# Patient Record
Sex: Male | Born: 1950
Health system: Southern US, Community
[De-identification: ages and names within clinical notes are randomized; demographics above are authoritative.]

## PROBLEM LIST (undated history)

## (undated) DIAGNOSIS — G4730 Sleep apnea, unspecified: Secondary | ICD-10-CM

## (undated) DIAGNOSIS — J45909 Unspecified asthma, uncomplicated: Secondary | ICD-10-CM

## (undated) DIAGNOSIS — E119 Type 2 diabetes mellitus without complications: Secondary | ICD-10-CM

## (undated) DIAGNOSIS — K759 Inflammatory liver disease, unspecified: Secondary | ICD-10-CM

## (undated) DIAGNOSIS — Z952 Presence of prosthetic heart valve: Secondary | ICD-10-CM

## (undated) DIAGNOSIS — I519 Heart disease, unspecified: Secondary | ICD-10-CM

## (undated) DIAGNOSIS — B1910 Unspecified viral hepatitis B without hepatic coma: Secondary | ICD-10-CM

## (undated) DIAGNOSIS — I779 Disorder of arteries and arterioles, unspecified: Secondary | ICD-10-CM

## (undated) DIAGNOSIS — I10 Essential (primary) hypertension: Secondary | ICD-10-CM

## (undated) DIAGNOSIS — E785 Hyperlipidemia, unspecified: Secondary | ICD-10-CM

## (undated) DIAGNOSIS — Z89612 Acquired absence of left leg above knee: Secondary | ICD-10-CM

## (undated) DIAGNOSIS — I2510 Atherosclerotic heart disease of native coronary artery without angina pectoris: Secondary | ICD-10-CM

## (undated) DIAGNOSIS — I351 Nonrheumatic aortic (valve) insufficiency: Secondary | ICD-10-CM

## (undated) DIAGNOSIS — Z7901 Long term (current) use of anticoagulants: Secondary | ICD-10-CM

## (undated) DIAGNOSIS — M1990 Unspecified osteoarthritis, unspecified site: Secondary | ICD-10-CM

## (undated) DIAGNOSIS — I509 Heart failure, unspecified: Secondary | ICD-10-CM

## (undated) DIAGNOSIS — I219 Acute myocardial infarction, unspecified: Secondary | ICD-10-CM

## (undated) DIAGNOSIS — F419 Anxiety disorder, unspecified: Secondary | ICD-10-CM

## (undated) DIAGNOSIS — E11621 Type 2 diabetes mellitus with foot ulcer: Secondary | ICD-10-CM

## (undated) DIAGNOSIS — I4821 Permanent atrial fibrillation: Secondary | ICD-10-CM

## (undated) DIAGNOSIS — N184 Chronic kidney disease, stage 4 (severe): Secondary | ICD-10-CM

## (undated) DIAGNOSIS — I5042 Chronic combined systolic (congestive) and diastolic (congestive) heart failure: Secondary | ICD-10-CM

## (undated) DIAGNOSIS — I350 Nonrheumatic aortic (valve) stenosis: Secondary | ICD-10-CM

## (undated) HISTORY — PX: CARDIAC VALVE REPLACEMENT: SHX585

## (undated) HISTORY — PX: CHOLECYSTECTOMY: SHX55

## (undated) HISTORY — PX: FRACTURE SURGERY: SHX138

---
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
## 2001-12-03 DIAGNOSIS — B1910 Unspecified viral hepatitis B without hepatic coma: Secondary | ICD-10-CM

## 2001-12-03 HISTORY — DX: Unspecified viral hepatitis B without hepatic coma: B19.10

## 2007-07-09 ENCOUNTER — Encounter: Admission: RE | Admit: 2007-07-09 | Discharge: 2007-09-02 | Payer: Self-pay | Admitting: Endocrinology

## 2008-08-27 ENCOUNTER — Encounter: Admission: RE | Admit: 2008-08-27 | Discharge: 2008-08-27 | Payer: Self-pay | Admitting: Endocrinology

## 2012-02-13 ENCOUNTER — Other Ambulatory Visit: Payer: Self-pay | Admitting: Gynecology

## 2012-02-13 NOTE — Telephone Encounter (Signed)
error 

## 2012-09-10 ENCOUNTER — Other Ambulatory Visit: Payer: Self-pay | Admitting: Cardiovascular Disease

## 2012-09-10 ENCOUNTER — Ambulatory Visit
Admission: RE | Admit: 2012-09-10 | Discharge: 2012-09-10 | Disposition: A | Discharge status: Home / Self Care | Payer: 59 | Source: Ambulatory Visit | Attending: Cardiovascular Disease | Admitting: Cardiovascular Disease

## 2012-09-10 DIAGNOSIS — Z01811 Encounter for preprocedural respiratory examination: Secondary | ICD-10-CM

## 2012-09-10 DIAGNOSIS — R079 Chest pain, unspecified: Secondary | ICD-10-CM

## 2012-09-11 ENCOUNTER — Encounter (HOSPITAL_COMMUNITY): Payer: Self-pay | Admitting: Pharmacy Technician

## 2012-09-17 ENCOUNTER — Other Ambulatory Visit: Payer: Self-pay | Admitting: Cardiovascular Disease

## 2012-09-18 ENCOUNTER — Other Ambulatory Visit: Payer: Self-pay | Admitting: *Deleted

## 2012-09-18 ENCOUNTER — Ambulatory Visit (HOSPITAL_COMMUNITY)
Admission: RE | Admit: 2012-09-18 | Discharge: 2012-09-18 | Disposition: A | Discharge status: Home / Self Care | Payer: 59 | Source: Ambulatory Visit | Attending: Cardiovascular Disease | Admitting: Cardiovascular Disease

## 2012-09-18 ENCOUNTER — Encounter (HOSPITAL_COMMUNITY)
Admission: RE | Disposition: A | Discharge status: Home / Self Care | Payer: Self-pay | Source: Ambulatory Visit | Attending: Cardiovascular Disease

## 2012-09-18 DIAGNOSIS — Z880 Allergy status to penicillin: Secondary | ICD-10-CM | POA: Insufficient documentation

## 2012-09-18 DIAGNOSIS — E119 Type 2 diabetes mellitus without complications: Secondary | ICD-10-CM | POA: Insufficient documentation

## 2012-09-18 DIAGNOSIS — I359 Nonrheumatic aortic valve disorder, unspecified: Secondary | ICD-10-CM

## 2012-09-18 DIAGNOSIS — I4891 Unspecified atrial fibrillation: Secondary | ICD-10-CM | POA: Insufficient documentation

## 2012-09-18 DIAGNOSIS — Z888 Allergy status to other drugs, medicaments and biological substances status: Secondary | ICD-10-CM | POA: Insufficient documentation

## 2012-09-18 DIAGNOSIS — Z7901 Long term (current) use of anticoagulants: Secondary | ICD-10-CM | POA: Insufficient documentation

## 2012-09-18 DIAGNOSIS — G4730 Sleep apnea, unspecified: Secondary | ICD-10-CM | POA: Insufficient documentation

## 2012-09-18 DIAGNOSIS — Z79899 Other long term (current) drug therapy: Secondary | ICD-10-CM | POA: Insufficient documentation

## 2012-09-18 HISTORY — PX: ARCH AORTOGRAM: SHX5501

## 2012-09-18 HISTORY — PX: LEFT AND RIGHT HEART CATHETERIZATION WITH CORONARY ANGIOGRAM: SHX5449

## 2012-09-18 HISTORY — PX: CARDIAC CATHETERIZATION: SHX172

## 2012-09-18 HISTORY — PX: ABDOMINAL ANGIOGRAM: SHX5499

## 2012-09-18 LAB — GLUCOSE, CAPILLARY
Glucose-Capillary: 297 mg/dL — ABNORMAL HIGH (ref 70–99)
Glucose-Capillary: 285 mg/dL — ABNORMAL HIGH (ref 70–99)
Glucose-Capillary: 323 mg/dL — ABNORMAL HIGH (ref 70–99)

## 2012-09-18 LAB — POCT I-STAT 3, ART BLOOD GAS (G3+)
O2 Saturation: 94 %
TCO2: 25 mmol/L (ref 0–100)
pCO2 arterial: 42.5 mmHg (ref 35.0–45.0)
pH, Arterial: 7.357 (ref 7.350–7.450)

## 2012-09-18 LAB — PROTIME-INR
INR: 0.99 (ref 0.00–1.49)
Prothrombin Time: 13 seconds (ref 11.6–15.2)

## 2012-09-18 LAB — POCT I-STAT 3, VENOUS BLOOD GAS (G3P V)
TCO2: 26 mmol/L (ref 0–100)
pCO2, Ven: 45.1 mmHg (ref 45.0–50.0)
pH, Ven: 7.35 — ABNORMAL HIGH (ref 7.250–7.300)

## 2012-09-18 SURGERY — LEFT AND RIGHT HEART CATHETERIZATION WITH CORONARY ANGIOGRAM
Anesthesia: LOCAL

## 2012-09-18 MED ORDER — ASPIRIN 81 MG PO CHEW: 324.0000 mg | CHEWABLE_TABLET | ORAL | Status: DC

## 2012-09-18 MED ORDER — HEPARIN (PORCINE) IN NACL 2-0.9 UNIT/ML-% IJ SOLN
INTRAMUSCULAR | Status: AC
  Filled 2012-09-18: qty 1000

## 2012-09-18 MED ORDER — SODIUM CHLORIDE 0.9 % IV SOLN: 250.0000 mL | INTRAVENOUS | Status: DC | PRN

## 2012-09-18 MED ORDER — SODIUM CHLORIDE 0.9 % IV SOLN
INTRAVENOUS | Status: DC
  Administered 2012-09-18: 08:00:00 via INTRAVENOUS

## 2012-09-18 MED ORDER — NITROGLYCERIN 0.2 MG/ML ON CALL CATH LAB
INTRAVENOUS | Status: AC
  Filled 2012-09-18: qty 1

## 2012-09-18 MED ORDER — ONDANSETRON HCL 4 MG/2ML IJ SOLN: 4.0000 mg | Freq: Four times a day (QID) | INTRAMUSCULAR | Status: DC | PRN

## 2012-09-18 MED ORDER — ASPIRIN 81 MG PO CHEW
CHEWABLE_TABLET | ORAL | Status: AC
  Administered 2012-09-18: 324 mg
  Filled 2012-09-18: qty 4

## 2012-09-18 MED ORDER — INSULIN REGULAR HUMAN 100 UNIT/ML IJ SOLN: 5.0000 [IU] | Freq: Once | INTRAMUSCULAR | Status: DC

## 2012-09-18 MED ORDER — LIDOCAINE HCL (PF) 1 % IJ SOLN
INTRAMUSCULAR | Status: AC
  Filled 2012-09-18: qty 30

## 2012-09-18 MED ORDER — INSULIN ASPART 100 UNIT/ML ~~LOC~~ SOLN
SUBCUTANEOUS | Status: AC
  Filled 2012-09-18: qty 1

## 2012-09-18 MED ORDER — ASPIRIN EC 325 MG PO TBEC: 325.0000 mg | DELAYED_RELEASE_TABLET | Freq: Every day | ORAL | Status: DC

## 2012-09-18 MED ORDER — MIDAZOLAM HCL 2 MG/2ML IJ SOLN
INTRAMUSCULAR | Status: AC
  Filled 2012-09-18: qty 2

## 2012-09-18 MED ORDER — SODIUM CHLORIDE 0.9 % IJ SOLN: 3.0000 mL | INTRAMUSCULAR | Status: DC | PRN

## 2012-09-18 MED ORDER — SODIUM CHLORIDE 0.9 % IV SOLN: INTRAVENOUS | Status: DC

## 2012-09-18 MED ORDER — DIAZEPAM 5 MG PO TABS: 5.0000 mg | ORAL_TABLET | ORAL | Status: DC

## 2012-09-18 MED ORDER — SODIUM CHLORIDE 0.9 % IJ SOLN: 3.0000 mL | Freq: Two times a day (BID) | INTRAMUSCULAR | Status: DC

## 2012-09-18 MED ORDER — DIPHENHYDRAMINE HCL 50 MG/ML IJ SOLN
INTRAMUSCULAR | Status: AC
  Filled 2012-09-18: qty 1

## 2012-09-18 MED ORDER — INSULIN ASPART 100 UNIT/ML ~~LOC~~ SOLN
5.0000 [IU] | Freq: Once | SUBCUTANEOUS | Status: AC
  Administered 2012-09-18: 5 [IU] via SUBCUTANEOUS

## 2012-09-18 MED ORDER — FENTANYL CITRATE 0.05 MG/ML IJ SOLN
INTRAMUSCULAR | Status: AC
  Filled 2012-09-18: qty 2

## 2012-09-18 MED ORDER — DIPHENHYDRAMINE HCL 50 MG/ML IJ SOLN
25.0000 mg | Freq: Once | INTRAMUSCULAR | Status: AC
  Administered 2012-09-18: 25 mg via INTRAVENOUS

## 2012-09-18 MED ORDER — FAMOTIDINE IN NACL 20-0.9 MG/50ML-% IV SOLN
20.0000 mg | Freq: Once | INTRAVENOUS | Status: AC
  Administered 2012-09-18: 20 mg via INTRAVENOUS

## 2012-09-18 MED ORDER — ACETAMINOPHEN 325 MG PO TABS: 650.0000 mg | ORAL_TABLET | ORAL | Status: DC | PRN

## 2012-09-18 MED ORDER — DIAZEPAM 5 MG PO TABS
ORAL_TABLET | ORAL | Status: AC
  Administered 2012-09-18: 5 mg
  Filled 2012-09-18: qty 1

## 2012-09-18 NOTE — Progress Notes (Signed)
Discussed with [name].  Plan is for AVR on 09/29/12.  Will therefore, not restart Coumadin but wilol begin xarelto 20 mg begin tomorrow. Will hold for 3 days prior to planned surgical date. Pharmacy is [facility]. Joseph Hoffman A 09/18/2012 2:11 PM

## 2012-09-18 NOTE — H&P (Signed)
  Updated H&P: See complete dictated office note from 08/29/2012.  Since that evaluation patient has decided to proceed with cardiac catheterization and probable AVR if indicated. I have discussed procedure in detail with patient including risks and benefits.  No chest pain or SOB. No change in PEx. Labs reviewed.  He has been off coumadin since 10/13. Inr today 0.99. Plan R and L heart cath this am.

## 2012-09-18 NOTE — CV Procedure (Signed)
R and L heart Catheterization  Joseph Hoffman, 61 y.o., male  Full note dictated; see diagram  DICTATION # [id number], [id number]  RA: 20  (Corrected due to re zero) 10  RV: 50/13;  corrected 40/10 PA: 50/29; corrected 40/19 PC: 29 ;    Corrected 19  Pullback: LV: 188/14 AO: 123/60 Peak to peak gradient: 65 mm Hg; mean gradient 50 mm Hg CO: Thermo 6.9;   Fick 6.2 l/m CI:                 2.9;           2.6l/m/m2  AVA: 1.0cm2 (Thermo); 0.9 cm2(Fick)  EF 45% Calcified AV with reduced excursion. No sig AR   No significant CAD  REC: AVR Depending on timing of surgery, since patient has persistent AF will need to consider resumption of anticoagulation if surgery is to be delayed significantly.  Troy Sine, MD, [facility] 09/18/2012 11:02 AM

## 2012-09-18 NOTE — Consult Note (Signed)
[address]            [place] [address]          [id number]       Reason for Consult: Severe aortic stenosis Referring Physician:  Dr. Shelva Majestic  Joseph Hoffman is an 61 y.o. male.  HPI:  The patient is a 61 year old gentleman who is originally from [place], [place] and has a long-standing history of chronic atrial fibrillation dating back to 1994. He said he had been treated with digoxin in the past and has been maintained on Coumadin. He was referred to cardiology in March of 2013 due to some chest discomfort and abnormal EKG. He was noted to have a heart murmur which had never been mentioned before. An echocardiogram showed moderately severe aortic stenosis with a mean gradient of 53 and a peak gradient of 85 with a calculated aortic valve area of 1.0. He had severe dilatation of the left atrium and moderately severe dilatation of the right atrium with an ejection fraction of 45-50%. A repeat echocardiogram on 08/22/2012 showed a calcified aortic valve with further progression of his aortic stenosis with a mean gradient of 69 and a peak gradient of 104 and a calculated aortic valve area of 0.85 cm. There is mild mitral regurgitation and mitral annular calcification. EF is still 45-50%. He says he did not have any symptoms until the past week or so when he has noticed some tightness in the left chest and neck.  PMH:  Chronic Atrial Fibrillation since 1994  Diabetes  Sleep apnea    PSH:  Laparoscopic cholecystectomy in past year  Family hx:  Mother had AVR in her 41's  Father had CABG  Social History:  does not have a smoking history on file. He does not have any smokeless tobacco history on file. His alcohol and drug histories not on file.  Allergies:  Allergies  Allergen Reactions  . Iohexol Anaphylaxis  . Penicillins Other (See Comments)    Unknown.[name]    Medications:  I have reviewed the patient's  current medications. Prior to Admission:  Prescriptions prior to admission  Medication Sig Dispense Refill  . amLODipine-valsartan (EXFORGE) 5-320 MG per tablet Take 0.5 tablets by mouth every evening.       . Canagliflozin (INVOKANA) 300 MG TABS Take 1 tablet by mouth every morning.       . cholecalciferol (VITAMIN D) 1000 UNITS tablet Take 1,000 Units by mouth daily.      . Chromium 200 MCG CAPS Take 200 mcg by mouth daily.      . digoxin (LANOXIN) 0.25 MG tablet Take 0.375 mg by mouth every evening.       . escitalopram (LEXAPRO) 10 MG tablet Take 10 mg by mouth every evening.       . Glucosamine-Chondroit-Vit C-Mn (GLUCOSAMINE 1500 COMPLEX PO) Take 1,500 mg by mouth daily.      [name] glyBURIDE (DIABETA) 5 MG tablet Take 5 mg by mouth daily with breakfast.      . insulin glargine (LANTUS) 100 UNIT/ML injection Inject 5-20 Units into the skin 2 (two) times daily. 5-20 units at breakfast and dinner depending on blood sugar levels      . Liraglutide (VICTOZA) 18 MG/3ML SOLN Inject 18 mg into the skin daily with breakfast.      . metFORMIN (GLUCOPHAGE-XR) 500 MG 24  hr tablet Take 2,000 mg by mouth at bedtime.      . Multiple Vitamins-Minerals (CENTRUM SPECIALIST HEART PO) Take 1 tablet by mouth 2 (two) times daily.      . Niacin-Simvastatin (SIMCOR PO) Take 1 tablet by mouth at bedtime.      [name] warfarin (COUMADIN) 10 MG tablet Take 10-15 mg by mouth every evening. 10 mg mon thru sat and 15 mg on sun      . albuterol (PROVENTIL HFA;VENTOLIN HFA) 108 (90 BASE) MCG/ACT inhaler Inhale 2 puffs into the lungs every 6 (six) hours as needed. For shortness of breath      . Fluticasone-Salmeterol (ADVAIR) 250-50 MCG/DOSE AEPB Inhale 1 puff into the lungs 2 (two) times daily as needed. For asthma related symptoms      . hydrochlorothiazide (HYDRODIURIL) 25 MG tablet Take 25 mg by mouth daily.       Scheduled:   . aspirin      . aspirin  324 mg Oral Pre-Cath  . aspirin EC  325 mg Oral Daily  . diazepam        . diazepam  5 mg Oral On Call  . diphenhydrAMINE      . diphenhydrAMINE  25 mg Intravenous Once  . famotidine  20 mg Intravenous Once  . fentaNYL      . heparin      . lidocaine      . midazolam      . nitroGLYCERIN      . sodium chloride  3 mL Intravenous Q12H   Continuous:   . sodium chloride 75 mL/hr at 09/18/12 0819  . sodium chloride     [id number] chloride, acetaminophen, ondansetron (ZOFRAN) IV, sodium chloride Anti-infectives    None      Results for orders placed during the hospital encounter of 09/18/12 (from the past 48 hour(s))  PROTIME-INR     Status: Normal   Collection Time   09/18/12  7:19 AM      Component Value Range Comment   Prothrombin Time 13.0  11.6 - 15.2 seconds    INR 0.99  0.00 - 1.49   GLUCOSE, CAPILLARY     Status: Abnormal   Collection Time   09/18/12  7:33 AM      Component Value Range Comment   Glucose-Capillary 297 (*) 70 - 99 mg/dL    Comment 1 Documented in Chart      Comment 2 Notify RN     GLUCOSE, CAPILLARY     Status: Abnormal   Collection Time   09/18/12 10:41 AM      Component Value Range Comment   Glucose-Capillary 285 (*) 70 - 99 mg/dL     No results found.  Review of Systems  Constitutional: Positive for weight loss. Negative for fever, chills, malaise/fatigue and diaphoresis.  HENT: Negative.   Eyes: Negative.   Respiratory: Negative.   Cardiovascular: Positive for chest pain. Negative for orthopnea, leg swelling and PND.  Gastrointestinal: Negative.   Genitourinary: Negative.   Musculoskeletal: Negative.   Skin: Negative.   Neurological: Negative.  Negative for weakness.  Endo/Heme/Allergies: Negative.   Psychiatric/Behavioral: Negative.    Blood pressure 149/97, pulse 81, temperature 99.3 F (37.4 C), temperature source Oral, resp. rate 20, height 5\' 11"  (1.803 m), weight 117.935 kg (260 lb), SpO2 96.00%. Physical Exam  Constitutional: He is oriented to person, place, and time. He appears well-developed and  well-nourished. No distress.  HENT:  Head: Normocephalic and atraumatic.  Mouth/Throat: Oropharynx  is clear and moist.  Eyes: Conjunctivae normal and EOM are normal. Pupils are equal, round, and reactive to light.  Neck: Normal range of motion. Neck supple. No JVD present. No thyromegaly present.  Cardiovascular: Normal rate and intact distal pulses.   Murmur heard.      Irregularly irregular rhythm.  3/6 harsh systolic murmur over aorta.  Respiratory: Effort normal and breath sounds normal.  GI: Bowel sounds are normal. He exhibits no distension and no mass. There is no tenderness.  Musculoskeletal: He exhibits no edema.  Neurological: He is alert and oriented to person, place, and time. He has normal strength. No cranial nerve deficit or sensory deficit.  Skin: Skin is warm and dry.  Psychiatric: He has a normal mood and affect.    Cardiac Cath:  RA: 20 (Corrected due to re zero) 10  RV: 50/13; corrected 40/10  PA: 50/29; corrected 40/19  PC: 29 ; Corrected 19  Pullback:  LV: 188/14  AO: 123/60  Peak to peak gradient: 65 mm Hg; mean gradient 50 mm Hg  CO: Thermo 6.9; Fick 6.2 l/m  CI: 2.9; 2.6l/m/m2  AVA: 1.0cm2 (Thermo); 0.9 cm2(Fick)  EF 45%  Calcified AV with reduced excursion.  No sig AR  No significant CAD  REC: AVR  Assessment/Plan:  He has progressed to severe aortic stenosis and may be starting to develop symptoms. I agree that aortic valve replacement is indicated to prevent progressive left ventricular dysfunction and acute decompensation or sudden death. Since he has chronic atrial fibrillation and will remain on Coumadin and is only 61 years old I will plan on using a mechanical valve. I discussed the pros and cons of mechanical and tissue valves with the patient and his wife and he is in agreement with using a mechanical valve. He has a long history of chronic atrial fibrillation dating back to 1994 with severely dilated left and right atria. It is very unlikely  that he would maintain sinus rhythm even with a Maze procedure. Since his rate is well -controlled and he is going to be anticoagulated I would not plan any further intervention for his atrial fibrillation. I discussed the operative procedure with the patient and family including alternatives, benefits and risks; including but not limited to bleeding, blood transfusion, infection, stroke, myocardial infarction, graft failure, heart block requiring a permanent pacemaker, organ dysfunction, and death.  Earleen Newport understands and agrees to proceed.  We will schedule surgery for Monday 09/29/2012 at the patient's request. He will be started on Xarelto for his atrial fibrillation and that will be discontinued 3 days preop.  BARTLE,BRYAN K 09/18/2012, 2:33 PM

## 2012-09-19 NOTE — Cardiovascular Report (Signed)
Joseph Hoffman, Joseph Hoffman NO.:  [id number]  MEDICAL RECORD NO.:  [id number]  LOCATION:  [facility]                         FACILITY:  [facility]  PHYSICIAN:  Shelva Majestic, M.D.     DATE OF BIRTH:  [birth year]  DATE OF PROCEDURE:  09/18/2012 DATE OF DISCHARGE:  09/18/2012                           CARDIAC CATHETERIZATION   PROCEDURE:  Right and left heart catheterization.  INDICATIONS:  [name] is a very pleasant 61 year old gentleman originally from [place], [place].  He has a history of long- standing atrial fibrillation dating back to 61.  He had first presented to me in March 2013 through referral of Dr. Wilson Singer for an abnormal ECG and chest sensation.  An echo Doppler study at that time revealed moderately severe aortic stenosis with a mean gradient of 53, maximum peak instantaneous gradient of 85, and a calculated aortic valve area of 1.0.  Ejection fraction was in the 45-50% range.  He subsequently underwent repeat echo Doppler study on August 22, 2012. Ejection fraction was approximately 45%.  He has significant biatrial enlargement, moderately severe calcification of his aorta predominantly involving the noncoronary cusp and his aortic stenosis had progressed such that now his mean gradient was 69, peak instantaneous gradient 104, and calculated aortic valve area was 0.85 square cm.  Estimated RV systolic pressure was 29 mm.  The patient now presents for definitive right and left heart catheterization to further evaluate his severe aortic stenosis.  PROCEDURE:  After premedication with Versed 2 mg plus fentanyl 50 mcg, the patient was prepped and draped in usual fashion.  His right femoral artery was punctured anteriorly and a 6-French sheath was inserted.  A 7- French venous sheath was inserted into the right femoral vein.  Gordy Councilman catheterization was done with the catheter being advanced to the RA, RV, PA, PC positions with hemodynamic pressure  recording.  Midway through the pressures were re-zeroed such that the initial atrial pressures will probably overestimate it and perhaps were 10 mm less. The thermodilution cardiac output was obtained.  Oxygen saturation was obtained in the PA.  A pigtail catheter was then inserted via the 6- French sheath into the central aorta, central aortic pressure and PA pressure were recorded.  The pigtail catheter was able to cross using a straight wire into the left ventricle.  O2 saturation was obtained in the left ventricle for Fick cardiac output determination.  Simultaneous LV, PC pressures were recorded.  Also the pressure transducer from the right heart cath was placed on the FA and LV, FA pressures were recorded.  Left ventriculography was performed in the RAO projection. An LV-AO pullback was then performed.  Aortic root was then obtained as well as distal aortography.  The pigtail catheter was then removed. Attention was then directed at the coronary arteries and 5-French FL4, 5- [place] FL5, and FR4 catheters were used.  The FL5 catheter was necessary for more selective engagement into the left circumflex system since there was almost a common ostium, left main which immediately bifurcated into an LAD and circumflex.  The arterial sheath was then closed using the Minx closure device system with excellent hemostasis.  Hemostasis was  obtained by direct manual pressure for the venous system.  The patient tolerated the procedure well and returned to his room in satisfactory condition.  Right atrial pressure following adjustment for 0 pressure was in the 10- 15 mm range.  Initial RV pressure was recorded at 50/13, PA pressure 49/33 but these actually may be overestimated due to the re-zeroing that was later done.  Pulmonary capillary wedge pressure subsequently was 20, V wave 26, mean of 18.  On pullback, LV pressure was 180/14, AO pressure was 188/14, LV pressure was 188/14, AO pressure  was 123/60, giving a peak to peak gradient of 65 mm.  The mean gradient was approximately 50 mm.  Cardiac output was 6.9 by thermodilution and 6.2 by Fick method with an index of 2.9 and 2.6 L per meter squared respectively.  Aortic valve area was 1.0 by the thermodilution method and 0.9 cm squared by the Fick method.  Fluoroscopy revealed significantly calcified aortic valve with reduced mobility.  Aortography did not demonstrate any significant aortic insufficiency.  The aortic root did not appear to be significantly dilated.  RAO ventriculography revealed an ejection fraction of approximately 45%. Distal aortography did not demonstrate any significant renal artery stenosis or significant aortoiliac disease.  ANGIOGRAPHIC DATA:  The left main essentially was a common ostium which immediately bifurcated into the LAD and circumflex system.  The LAD had mild luminal irregularity but was without significant stenoses and gave rise to 3 proximal septal perforating arteries and 2 major diagonal vessels and extended to the apex.  The circumflex vessel was free of significant disease.  It gave rise to a major bifurcating marginal branch, which extended to the apex.  Selective angiography in the right coronary artery revealed a fairly normal right coronary artery, which supplied the PDA and posterolateral vessel.  IMPRESSION: 1. Severe calcific aortic stenosis with a peak to peak gradient of 65     mm, mean gradient of 50 mm, and calculated aortic valve area at 0.9-     1.0 square cm. 2. Mild left ventricular dysfunction with an ejection fraction of 45%. 3. No significant coronary obstructive disease.  RECOMMENDATION:  Surgical consultation for consideration of aortic valve replacement surgery.          ______________________________ Shelva Majestic, M.D.     TK/MEDQ  D:  09/18/2012  T:  09/19/2012  Job:  [id number]  cc:   Ronaldo Miyamoto, M.D.

## 2012-09-25 ENCOUNTER — Ambulatory Visit (HOSPITAL_COMMUNITY)
Admission: RE | Admit: 2012-09-25 | Discharge: 2012-09-25 | Disposition: A | Discharge status: Home / Self Care | Payer: 59 | Source: Ambulatory Visit | Attending: Surgery | Admitting: Surgery

## 2012-09-25 ENCOUNTER — Encounter (HOSPITAL_COMMUNITY)
Admission: RE | Admit: 2012-09-25 | Discharge: 2012-09-25 | Disposition: A | Discharge status: Home / Self Care | Payer: 59 | Source: Ambulatory Visit | Attending: Surgery | Admitting: Surgery

## 2012-09-25 VITALS — BP 127/77 | HR 40 | Temp 98.4°F | Resp 20 | Ht 68.0 in | Wt 262.6 lb

## 2012-09-25 DIAGNOSIS — I359 Nonrheumatic aortic valve disorder, unspecified: Secondary | ICD-10-CM

## 2012-09-25 DIAGNOSIS — Z01810 Encounter for preprocedural cardiovascular examination: Secondary | ICD-10-CM

## 2012-09-25 DIAGNOSIS — Z01811 Encounter for preprocedural respiratory examination: Secondary | ICD-10-CM | POA: Insufficient documentation

## 2012-09-25 DIAGNOSIS — I350 Nonrheumatic aortic (valve) stenosis: Secondary | ICD-10-CM | POA: Insufficient documentation

## 2012-09-25 HISTORY — DX: Sleep apnea, unspecified: G47.30

## 2012-09-25 HISTORY — DX: Acute myocardial infarction, unspecified: I21.9

## 2012-09-25 HISTORY — DX: Unspecified asthma, uncomplicated: J45.909

## 2012-09-25 HISTORY — DX: Inflammatory liver disease, unspecified: K75.9

## 2012-09-25 HISTORY — DX: Anxiety disorder, unspecified: F41.9

## 2012-09-25 HISTORY — DX: Nonrheumatic aortic (valve) stenosis: I35.0

## 2012-09-25 HISTORY — DX: Unspecified osteoarthritis, unspecified site: M19.90

## 2012-09-25 LAB — COMPREHENSIVE METABOLIC PANEL
ALT: 20 U/L (ref 0–53)
AST: 20 U/L (ref 0–37)
Alkaline Phosphatase: 48 U/L (ref 39–117)
CO2: 23 mEq/L (ref 19–32)
GFR calc Af Amer: >90 mL/min (ref >90)
Glucose, Bld: 107 mg/dL — ABNORMAL HIGH (ref 70–99)
Potassium: 4.1 mEq/L (ref 3.5–5.1)
Sodium: 136 mEq/L (ref 135–145)
Total Protein: 7.3 g/dL (ref 6.0–8.3)

## 2012-09-25 LAB — URINALYSIS, ROUTINE W REFLEX MICROSCOPIC
Hgb urine dipstick: NEGATIVE
Leukocytes, UA: NEGATIVE
Nitrite: NEGATIVE
Protein, ur: NEGATIVE mg/dL
Specific Gravity, Urine: 1.045 — ABNORMAL HIGH (ref 1.005–1.030)
Urobilinogen, UA: 0.2 mg/dL (ref 0.0–1.0)

## 2012-09-25 LAB — PULMONARY FUNCTION TEST

## 2012-09-25 LAB — BLOOD GAS, ARTERIAL
Bicarbonate: 24 mEq/L (ref 20.0–24.0)
Drawn by: 206361
FIO2: 0.21 %
pCO2 arterial: 37 mmHg (ref 35.0–45.0)
pH, Arterial: 7.428 (ref 7.350–7.450)
pO2, Arterial: 83.3 mmHg (ref 80.0–100.0)

## 2012-09-25 LAB — URINE MICROSCOPIC-ADD ON

## 2012-09-25 LAB — TYPE AND SCREEN: Antibody Screen: NEGATIVE

## 2012-09-25 LAB — CBC
Hemoglobin: 15.9 g/dL (ref 13.0–17.0)
Platelets: 191 10*3/uL (ref 150–400)
RBC: 5.48 MIL/uL (ref 4.22–5.81)

## 2012-09-25 MED ORDER — CHLORHEXIDINE GLUCONATE 4 % EX LIQD: 30.0000 mL | CUTANEOUS | Status: DC

## 2012-09-25 NOTE — Progress Notes (Signed)
VASCULAR LAB PRELIMINARY  PRELIMINARY  PRELIMINARY  PRELIMINARY  Pre-op Cardiac Surgery  Carotid Findings:  Bilateral:  No evidence of hemodynamically significant internal carotid artery stenosis.   Vertebral artery flow is antegrade.     Upper Extremity Right Left  Brachial Pressures 122 Triphasic 125 Triphasic  Radial Waveforms Triphasic Triphasic  Ulnar Waveforms Triphasic Triphasic  Palmar Arch (Allen's Test) Normal Normal   Findings:  Doppler waveforms remained normal bilaterally with both radial and ulnar compressions                             Lindi Abram, RVS 09/25/2012, 1:24 PM  BIGGS,SANDRA, RVT 09/25/2012, 1:24 PM

## 2012-09-25 NOTE — Pre-Procedure Instructions (Signed)
[place]  09/25/2012   Your procedure is scheduled on:  Monday September 29, 2012 at 0730 AM  Report to [facility] at [facility].  Call this number if you have problems the morning of surgery: [phone]   Remember:   Do not eat food or drink:After Midnight.Sunday       Take these medicines the morning of surgery with A SIP OF WATER: Use Albuterol and Advair inhaler if needed and bring with you day of surgery. Stop Coumadin as ordered             . You may wear deodorant.  Do not shave 48 hours prior to surgery. Men may shave face and neck.  Do not bring valuables to the hospital.  Contacts, dentures or bridgework may not be worn into surgery.  Leave suitcase in the car. After surgery it may be brought to your room.  For patients admitted to the hospital, checkout time is 11:00 AM the day of discharge.   Patients discharged the day of surgery will not be allowed to drive home.    Special Instructions: Incentive Spirometry - Practice and bring it with you on the day of surgery. Shower using CHG 2 nights before surgery and the night before surgery.  If you shower the day of surgery use CHG.  Use special wash - you have one bottle of CHG for all showers.  You should use approximately 1/3 of the bottle for each shower.   Please read over the following fact sheets that you were given: Pain Booklet, Coughing and Deep Breathing, Blood Transfusion Information, MRSA Information and Surgical Site Infection Prevention

## 2012-09-26 NOTE — Progress Notes (Signed)
Requested sleep study from [facility]

## 2012-09-26 NOTE — Consult Note (Signed)
Anesthesia Chart Review:  Patient is a 61 year old male scheduled for AVR on 09/29/12.  History includes severe AS, former smoker, obesity, DM2, OSA, anxiety, hepatitis (not specified) '03, asthma, afib.  PCP is Dr. Anda Kraft.  Cardiologist is Dr. Shelva Majestic.  EKG on 09/25/12 showed afib, right superior axis deviation, non-specific intraventricular block, RVH.  Cardiac cath on 09/18/12 showed: 1. Severe calcific aortic stenosis with a peak to peak gradient of 65 mm, mean gradient of 50 mm, and calculated aortic valve area at 0.9-1.0 square cm.  2. Mild left ventricular dysfunction with an ejection fraction of 45%.  3. No significant coronary obstructive disease.   Echo on 08/22/12 [facility]) showed technically difficult study. A. fib with controlled ventricular rate, intraventricular conduction delay, right bundle branch block.  EF 40-45%. LA was severely dilated. Calcification of the anterior posterior mitral valve leaflets. Calcified mitral apparatus. No significant mitral valve stenosis. Right ventricular systolic pressure was normal. Moderate to severe calcified predominant non-coronary cusp. Mild to moderate aortic regurgitation. Severe valvular aortic stenosis. There was aortic root sclerosis/calcification.  He had normal myocardial perfusion by nuclear stress test on 02/08/12 [facility]).  There was minimal chronic bronchitic changes on chest x-ray from 09/25/2012.  Labs noted.  Cr 0.75, glucose 107 (mean plasma glucose 180).  UA with > 1000 glucose.  A1C 7.9.  WBC 11.8, H/H 15.9/45.6.  PT/PTT WNL.  (Urine glucose and A1C called to Marlana Latus, RN at [facility].)  Myra Gianotti, PA-C

## 2012-09-28 MED ORDER — POTASSIUM CHLORIDE 2 MEQ/ML IV SOLN
80.0000 meq | INTRAVENOUS | Status: DC
  Filled 2012-09-28: qty 40

## 2012-09-28 MED ORDER — EPINEPHRINE HCL 1 MG/ML IJ SOLN
0.5000 ug/min | INTRAVENOUS | Status: DC
  Filled 2012-09-28: qty 4

## 2012-09-28 MED ORDER — MAGNESIUM SULFATE 50 % IJ SOLN
40.0000 meq | INTRAMUSCULAR | Status: DC
  Filled 2012-09-28: qty 10

## 2012-09-28 MED ORDER — SODIUM CHLORIDE 0.9 % IV SOLN
INTRAVENOUS | Status: AC
  Administered 2012-09-29: 70 mL/h via INTRAVENOUS
  Filled 2012-09-28: qty 40

## 2012-09-28 MED ORDER — CEFUROXIME SODIUM 1.5 G IJ SOLR
1.5000 g | INTRAMUSCULAR | Status: AC
  Filled 2012-09-28: qty 1.5

## 2012-09-28 MED ORDER — DOPAMINE-DEXTROSE 3.2-5 MG/ML-% IV SOLN
2.0000 ug/kg/min | INTRAVENOUS | Status: DC
  Filled 2012-09-28: qty 250

## 2012-09-28 MED ORDER — VANCOMYCIN HCL 1000 MG IV SOLR
1500.0000 mg | INTRAVENOUS | Status: AC
  Administered 2012-09-29: 1500 mg via INTRAVENOUS
  Filled 2012-09-28: qty 1500

## 2012-09-28 MED ORDER — METOPROLOL TARTRATE 12.5 MG HALF TABLET
12.5000 mg | ORAL_TABLET | Freq: Once | ORAL | Status: AC
  Administered 2012-09-29: 12.5 mg via ORAL
  Filled 2012-09-28: qty 1

## 2012-09-28 MED ORDER — DEXTROSE 5 % IV SOLN
750.0000 mg | INTRAVENOUS | Status: AC
  Filled 2012-09-28: qty 750

## 2012-09-28 MED ORDER — DEXMEDETOMIDINE HCL IN NACL 400 MCG/100ML IV SOLN
0.1000 ug/kg/h | INTRAVENOUS | Status: AC
  Administered 2012-09-29: 0.3 ug/kg/h via INTRAVENOUS
  Filled 2012-09-28: qty 100

## 2012-09-28 MED ORDER — PHENYLEPHRINE HCL 10 MG/ML IJ SOLN
30.0000 ug/min | INTRAVENOUS | Status: DC
  Filled 2012-09-28: qty 2

## 2012-09-28 MED ORDER — PLASMA-LYTE 148 IV SOLN
INTRAVENOUS | Status: AC
  Filled 2012-09-28: qty 2.5

## 2012-09-28 MED ORDER — SODIUM CHLORIDE 0.9 % IV SOLN
INTRAVENOUS | Status: AC
  Administered 2012-09-29: 1 [IU]/h via INTRAVENOUS
  Filled 2012-09-28: qty 1

## 2012-09-28 MED ORDER — NITROGLYCERIN IN D5W 200-5 MCG/ML-% IV SOLN
2.0000 ug/min | INTRAVENOUS | Status: AC
  Administered 2012-09-29: 5 ug/min via INTRAVENOUS
  Filled 2012-09-28: qty 250

## 2012-09-29 ENCOUNTER — Encounter (HOSPITAL_COMMUNITY): Payer: Self-pay | Admitting: Vascular Surgery

## 2012-09-29 ENCOUNTER — Inpatient Hospital Stay (HOSPITAL_COMMUNITY)
Admission: RE | Admit: 2012-09-29 | Discharge: 2012-10-04 | DRG: 219 | Disposition: A | Discharge status: Home / Self Care | Payer: 59 | Source: Ambulatory Visit | Attending: Surgery | Admitting: Surgery

## 2012-09-29 ENCOUNTER — Inpatient Hospital Stay (HOSPITAL_COMMUNITY): Payer: 59

## 2012-09-29 ENCOUNTER — Encounter (HOSPITAL_COMMUNITY): Payer: Self-pay | Admitting: *Deleted

## 2012-09-29 ENCOUNTER — Ambulatory Visit (HOSPITAL_COMMUNITY): Payer: 59 | Admitting: Vascular Surgery

## 2012-09-29 ENCOUNTER — Encounter (HOSPITAL_COMMUNITY)
Admission: RE | Disposition: A | Discharge status: Home / Self Care | Payer: Self-pay | Source: Ambulatory Visit | Attending: Surgery

## 2012-09-29 DIAGNOSIS — I4821 Permanent atrial fibrillation: Secondary | ICD-10-CM

## 2012-09-29 DIAGNOSIS — Z952 Presence of prosthetic heart valve: Secondary | ICD-10-CM

## 2012-09-29 DIAGNOSIS — E669 Obesity, unspecified: Secondary | ICD-10-CM | POA: Diagnosis present

## 2012-09-29 DIAGNOSIS — Z794 Long term (current) use of insulin: Secondary | ICD-10-CM

## 2012-09-29 DIAGNOSIS — E119 Type 2 diabetes mellitus without complications: Secondary | ICD-10-CM | POA: Diagnosis present

## 2012-09-29 DIAGNOSIS — I4901 Ventricular fibrillation: Secondary | ICD-10-CM | POA: Diagnosis not present

## 2012-09-29 DIAGNOSIS — Z79899 Other long term (current) drug therapy: Secondary | ICD-10-CM

## 2012-09-29 DIAGNOSIS — I350 Nonrheumatic aortic (valve) stenosis: Secondary | ICD-10-CM | POA: Diagnosis present

## 2012-09-29 DIAGNOSIS — Z6841 Body Mass Index (BMI) 40.0 and over, adult: Secondary | ICD-10-CM

## 2012-09-29 DIAGNOSIS — I359 Nonrheumatic aortic valve disorder, unspecified: Principal | ICD-10-CM | POA: Diagnosis present

## 2012-09-29 DIAGNOSIS — G4733 Obstructive sleep apnea (adult) (pediatric): Secondary | ICD-10-CM | POA: Diagnosis present

## 2012-09-29 DIAGNOSIS — I4891 Unspecified atrial fibrillation: Secondary | ICD-10-CM | POA: Diagnosis present

## 2012-09-29 DIAGNOSIS — E8779 Other fluid overload: Secondary | ICD-10-CM | POA: Diagnosis not present

## 2012-09-29 DIAGNOSIS — Z7901 Long term (current) use of anticoagulants: Secondary | ICD-10-CM

## 2012-09-29 HISTORY — DX: Permanent atrial fibrillation: I48.21

## 2012-09-29 HISTORY — DX: Heart disease, unspecified: I51.9

## 2012-09-29 HISTORY — DX: Long term (current) use of anticoagulants: Z79.01

## 2012-09-29 HISTORY — DX: Presence of prosthetic heart valve: Z95.2

## 2012-09-29 HISTORY — DX: Type 2 diabetes mellitus without complications: E11.9

## 2012-09-29 HISTORY — PX: AORTIC VALVE REPLACEMENT: SHX41

## 2012-09-29 LAB — GLUCOSE, CAPILLARY
Glucose-Capillary: 119 mg/dL — ABNORMAL HIGH (ref 70–99)
Glucose-Capillary: 123 mg/dL — ABNORMAL HIGH (ref 70–99)
Glucose-Capillary: 144 mg/dL — ABNORMAL HIGH (ref 70–99)
Glucose-Capillary: 133 mg/dL — ABNORMAL HIGH (ref 70–99)

## 2012-09-29 LAB — CBC
HCT: 34.2 % — ABNORMAL LOW (ref 39.0–52.0)
HCT: 35.5 % — ABNORMAL LOW (ref 39.0–52.0)
Hemoglobin: 11.9 g/dL — ABNORMAL LOW (ref 13.0–17.0)
Hemoglobin: 11.9 g/dL — ABNORMAL LOW (ref 13.0–17.0)
MCHC: 33.5 g/dL (ref 30.0–36.0)
MCV: 83.6 fL (ref 78.0–100.0)
WBC: 15.6 10*3/uL — ABNORMAL HIGH (ref 4.0–10.5)

## 2012-09-29 LAB — POCT I-STAT 4, (NA,K, GLUC, HGB,HCT)
Glucose, Bld: 195 mg/dL — ABNORMAL HIGH (ref 70–99)
Glucose, Bld: 225 mg/dL — ABNORMAL HIGH (ref 70–99)
Glucose, Bld: 187 mg/dL — ABNORMAL HIGH (ref 70–99)
HCT: 31 % — ABNORMAL LOW (ref 39.0–52.0)
HCT: 33 % — ABNORMAL LOW (ref 39.0–52.0)
HCT: 33 % — ABNORMAL LOW (ref 39.0–52.0)
Hemoglobin: 13.9 g/dL (ref 13.0–17.0)
Hemoglobin: 10.5 g/dL — ABNORMAL LOW (ref 13.0–17.0)
Hemoglobin: 10.9 g/dL — ABNORMAL LOW (ref 13.0–17.0)
Potassium: 4.5 mEq/L (ref 3.5–5.1)
Potassium: 4.3 mEq/L (ref 3.5–5.1)
Potassium: 3.8 mEq/L (ref 3.5–5.1)
Sodium: 138 mEq/L (ref 135–145)
Sodium: 139 mEq/L (ref 135–145)
Sodium: 139 mEq/L (ref 135–145)

## 2012-09-29 LAB — POCT I-STAT 3, ART BLOOD GAS (G3+)
Acid-Base Excess: 2 mmol/L (ref 0.0–2.0)
Acid-base deficit: 2 mmol/L (ref 0.0–2.0)
Acid-base deficit: 2 mmol/L (ref 0.0–2.0)
Bicarbonate: 27.9 mEq/L — ABNORMAL HIGH (ref 20.0–24.0)
Bicarbonate: 24.7 mEq/L — ABNORMAL HIGH (ref 20.0–24.0)
Bicarbonate: 22.9 mEq/L (ref 20.0–24.0)
O2 Saturation: 100 %
O2 Saturation: 93 %
O2 Saturation: 97 %
Patient temperature: 35.9
Patient temperature: 37.9
TCO2: 29 mmol/L (ref 0–100)
TCO2: 26 mmol/L (ref 0–100)
TCO2: 25 mmol/L (ref 0–100)
TCO2: 25 mmol/L (ref 0–100)
pCO2 arterial: 35.4 mmHg (ref 35.0–45.0)
pCO2 arterial: 44.3 mmHg (ref 35.0–45.0)
pH, Arterial: 7.378 (ref 7.350–7.450)
pH, Arterial: 7.451 — ABNORMAL HIGH (ref 7.350–7.450)
pH, Arterial: 7.351 (ref 7.350–7.450)
pO2, Arterial: 108 mmHg — ABNORMAL HIGH (ref 80.0–100.0)

## 2012-09-29 LAB — POCT I-STAT, CHEM 8
Chloride: 107 mEq/L (ref 96–112)
Glucose, Bld: 114 mg/dL — ABNORMAL HIGH (ref 70–99)
HCT: 35 % — ABNORMAL LOW (ref 39.0–52.0)
Potassium: 4.7 mEq/L (ref 3.5–5.1)
Sodium: 141 mEq/L (ref 135–145)

## 2012-09-29 LAB — HEMOGLOBIN AND HEMATOCRIT, BLOOD: HCT: 34.3 % — ABNORMAL LOW (ref 39.0–52.0)

## 2012-09-29 LAB — APTT: aPTT: 35 seconds (ref 24–37)

## 2012-09-29 LAB — CREATININE, SERUM
Creatinine, Ser: 0.66 mg/dL (ref 0.50–1.35)
GFR calc non Af Amer: >90 mL/min (ref >90)

## 2012-09-29 LAB — PROTIME-INR: INR: 1.16 (ref 0.00–1.49)

## 2012-09-29 SURGERY — REPLACEMENT, AORTIC VALVE, OPEN
Anesthesia: General | Site: Chest | Wound class: Clean

## 2012-09-29 MED ORDER — LACTATED RINGERS IV SOLN
INTRAVENOUS | Status: DC | PRN
  Administered 2012-09-29 (×2): via INTRAVENOUS

## 2012-09-29 MED ORDER — ASPIRIN 81 MG PO CHEW: 324.0000 mg | CHEWABLE_TABLET | Freq: Every day | ORAL | Status: DC

## 2012-09-29 MED ORDER — VANCOMYCIN HCL IN DEXTROSE 1-5 GM/200ML-% IV SOLN
1000.0000 mg | Freq: Once | INTRAVENOUS | Status: AC
  Administered 2012-09-29: 1000 mg via INTRAVENOUS
  Filled 2012-09-29: qty 200

## 2012-09-29 MED ORDER — LACTATED RINGERS IV SOLN
INTRAVENOUS | Status: DC | PRN
  Administered 2012-09-29 (×2): via INTRAVENOUS

## 2012-09-29 MED ORDER — SODIUM CHLORIDE 0.9 % IV SOLN
INTRAVENOUS | Status: DC
  Administered 2012-09-29: 20 mL/h via INTRAVENOUS

## 2012-09-29 MED ORDER — BISACODYL 5 MG PO TBEC
10.0000 mg | DELAYED_RELEASE_TABLET | Freq: Every day | ORAL | Status: DC
  Administered 2012-09-30: 10 mg via ORAL
  Filled 2012-09-29: qty 2

## 2012-09-29 MED ORDER — METOPROLOL TARTRATE 1 MG/ML IV SOLN: 2.5000 mg | INTRAVENOUS | Status: DC | PRN

## 2012-09-29 MED ORDER — MOXIFLOXACIN HCL IN NACL 400 MG/250ML IV SOLN
400.0000 mg | INTRAVENOUS | Status: AC
  Administered 2012-09-29: 400 mg via INTRAVENOUS

## 2012-09-29 MED ORDER — PLASMA-LYTE 148 IV SOLN
INTRAVENOUS | Status: DC | PRN
  Administered 2012-09-29: 10:00:00 via INTRAVASCULAR

## 2012-09-29 MED ORDER — PHENYLEPHRINE HCL 10 MG/ML IJ SOLN
10.0000 mg | INTRAVENOUS | Status: DC | PRN
  Administered 2012-09-29: 20 ug/min via INTRAVENOUS

## 2012-09-29 MED ORDER — PROPOFOL 10 MG/ML IV BOLUS
INTRAVENOUS | Status: DC | PRN
  Administered 2012-09-29: 50 mg via INTRAVENOUS

## 2012-09-29 MED ORDER — HEPARIN SODIUM (PORCINE) 1000 UNIT/ML IJ SOLN
INTRAMUSCULAR | Status: DC | PRN
  Administered 2012-09-29: 32000 [IU] via INTRAVENOUS

## 2012-09-29 MED ORDER — PANTOPRAZOLE SODIUM 40 MG PO TBEC
40.0000 mg | DELAYED_RELEASE_TABLET | Freq: Every day | ORAL | Status: DC
  Administered 2012-10-01: 40 mg via ORAL
  Filled 2012-09-29: qty 1

## 2012-09-29 MED ORDER — FENTANYL CITRATE 0.05 MG/ML IJ SOLN
INTRAMUSCULAR | Status: DC | PRN
  Administered 2012-09-29: 250 ug via INTRAVENOUS
  Administered 2012-09-29: 400 ug via INTRAVENOUS
  Administered 2012-09-29 (×3): 250 ug via INTRAVENOUS
  Administered 2012-09-29: 100 ug via INTRAVENOUS

## 2012-09-29 MED ORDER — MORPHINE SULFATE 2 MG/ML IJ SOLN
1.0000 mg | INTRAMUSCULAR | Status: AC | PRN
  Administered 2012-09-29: 1 mg via INTRAVENOUS
  Administered 2012-09-29 (×2): 2 mg via INTRAVENOUS
  Administered 2012-09-29: 1 mg via INTRAVENOUS
  Filled 2012-09-29 (×3): qty 1

## 2012-09-29 MED ORDER — METOPROLOL TARTRATE 25 MG/10 ML ORAL SUSPENSION
12.5000 mg | Freq: Two times a day (BID) | ORAL | Status: DC
  Filled 2012-09-29 (×3): qty 5

## 2012-09-29 MED ORDER — LACTATED RINGERS IV SOLN: 500.0000 mL | Freq: Once | INTRAVENOUS | Status: AC | PRN

## 2012-09-29 MED ORDER — DOCUSATE SODIUM 100 MG PO CAPS
200.0000 mg | ORAL_CAPSULE | Freq: Every day | ORAL | Status: DC
Start: 2012-09-30 — End: 2012-10-01
  Administered 2012-09-30 – 2012-10-01 (×2): 200 mg via ORAL
  Filled 2012-09-29 (×2): qty 2

## 2012-09-29 MED ORDER — SODIUM CHLORIDE 0.9 % IV SOLN: 250.0000 mL | INTRAVENOUS | Status: DC

## 2012-09-29 MED ORDER — THROMBIN 20000 UNITS EX SOLR
CUTANEOUS | Status: AC
  Filled 2012-09-29: qty 20000

## 2012-09-29 MED ORDER — SODIUM CHLORIDE 0.45 % IV SOLN
INTRAVENOUS | Status: DC
  Administered 2012-09-29: 20 mL/h via INTRAVENOUS

## 2012-09-29 MED ORDER — NITROGLYCERIN IN D5W 200-5 MCG/ML-% IV SOLN: 0.0000 ug/min | INTRAVENOUS | Status: DC

## 2012-09-29 MED ORDER — METOPROLOL TARTRATE 12.5 MG HALF TABLET
12.5000 mg | ORAL_TABLET | Freq: Two times a day (BID) | ORAL | Status: DC
  Filled 2012-09-29 (×3): qty 1

## 2012-09-29 MED ORDER — ROCURONIUM BROMIDE 100 MG/10ML IV SOLN
INTRAVENOUS | Status: DC | PRN
  Administered 2012-09-29 (×2): 50 mg via INTRAVENOUS

## 2012-09-29 MED ORDER — VECURONIUM BROMIDE 10 MG IV SOLR
INTRAVENOUS | Status: DC | PRN
  Administered 2012-09-29 (×2): 10 mg via INTRAVENOUS

## 2012-09-29 MED ORDER — SODIUM CHLORIDE 0.9 % IJ SOLN
3.0000 mL | Freq: Two times a day (BID) | INTRAMUSCULAR | Status: DC
  Administered 2012-09-30 – 2012-10-01 (×3): 3 mL via INTRAVENOUS

## 2012-09-29 MED ORDER — BISACODYL 10 MG RE SUPP: 10.0000 mg | Freq: Every day | RECTAL | Status: DC

## 2012-09-29 MED ORDER — DEXMEDETOMIDINE HCL IN NACL 200 MCG/50ML IV SOLN
0.1000 ug/kg/h | INTRAVENOUS | Status: DC
  Administered 2012-09-29: 0.7 ug/kg/h via INTRAVENOUS
  Administered 2012-09-29: 0.5 ug/kg/h via INTRAVENOUS
  Filled 2012-09-29 (×2): qty 50

## 2012-09-29 MED ORDER — PHENYLEPHRINE HCL 10 MG/ML IJ SOLN
0.0000 ug/min | INTRAVENOUS | Status: DC
  Filled 2012-09-29: qty 2

## 2012-09-29 MED ORDER — INSULIN REGULAR BOLUS VIA INFUSION
0.0000 [IU] | Freq: Three times a day (TID) | INTRAVENOUS | Status: DC
  Filled 2012-09-29: qty 10

## 2012-09-29 MED ORDER — MIDAZOLAM HCL 2 MG/2ML IJ SOLN: 2.0000 mg | INTRAMUSCULAR | Status: DC | PRN

## 2012-09-29 MED ORDER — METOPROLOL TARTRATE 12.5 MG HALF TABLET
12.5000 mg | ORAL_TABLET | Freq: Two times a day (BID) | ORAL | Status: DC
  Filled 2012-09-29: qty 1

## 2012-09-29 MED ORDER — MAGNESIUM SULFATE 40 MG/ML IJ SOLN
4.0000 g | Freq: Once | INTRAMUSCULAR | Status: AC
  Administered 2012-09-29: 4 g via INTRAVENOUS

## 2012-09-29 MED ORDER — MAGNESIUM SULFATE 40 MG/ML IJ SOLN
INTRAMUSCULAR | Status: AC
  Administered 2012-09-29: 4 g via INTRAVENOUS
  Filled 2012-09-29: qty 100

## 2012-09-29 MED ORDER — 0.9 % SODIUM CHLORIDE (POUR BTL) OPTIME
TOPICAL | Status: DC | PRN
  Administered 2012-09-29: 6000 mL

## 2012-09-29 MED ORDER — MORPHINE SULFATE 2 MG/ML IJ SOLN
2.0000 mg | INTRAMUSCULAR | Status: DC | PRN
  Administered 2012-09-30: 4 mg via INTRAVENOUS
  Administered 2012-09-30: 2 mg via INTRAVENOUS
  Filled 2012-09-29: qty 1
  Filled 2012-09-29: qty 2

## 2012-09-29 MED ORDER — ALBUMIN HUMAN 5 % IV SOLN
250.0000 mL | INTRAVENOUS | Status: AC | PRN
  Administered 2012-09-29 – 2012-09-30 (×2): 250 mL via INTRAVENOUS
  Filled 2012-09-29: qty 250

## 2012-09-29 MED ORDER — ONDANSETRON HCL 4 MG/2ML IJ SOLN: 4.0000 mg | Freq: Four times a day (QID) | INTRAMUSCULAR | Status: DC | PRN

## 2012-09-29 MED ORDER — ARTIFICIAL TEARS OP OINT
TOPICAL_OINTMENT | OPHTHALMIC | Status: DC | PRN
  Administered 2012-09-29: 1 via OPHTHALMIC

## 2012-09-29 MED ORDER — LACTATED RINGERS IV SOLN
INTRAVENOUS | Status: DC
  Administered 2012-09-29 – 2012-09-30 (×3): 20 mL/h via INTRAVENOUS

## 2012-09-29 MED ORDER — OXYCODONE HCL 5 MG PO TABS
5.0000 mg | ORAL_TABLET | ORAL | Status: DC | PRN
  Administered 2012-09-29 – 2012-10-01 (×6): 10 mg via ORAL
  Filled 2012-09-29 (×6): qty 2

## 2012-09-29 MED ORDER — ACETAMINOPHEN 160 MG/5ML PO SOLN
975.0000 mg | Freq: Four times a day (QID) | ORAL | Status: DC
  Filled 2012-09-29: qty 40.6

## 2012-09-29 MED ORDER — POTASSIUM CHLORIDE 10 MEQ/50ML IV SOLN
10.0000 meq | INTRAVENOUS | Status: AC
  Administered 2012-09-29 (×3): 10 meq via INTRAVENOUS

## 2012-09-29 MED ORDER — ALBUMIN HUMAN 5 % IV SOLN
INTRAVENOUS | Status: DC | PRN
  Administered 2012-09-29 (×2): via INTRAVENOUS

## 2012-09-29 MED ORDER — ACETAMINOPHEN 500 MG PO TABS
1000.0000 mg | ORAL_TABLET | Freq: Four times a day (QID) | ORAL | Status: DC
  Administered 2012-09-30 – 2012-10-01 (×7): 1000 mg via ORAL
  Filled 2012-09-29 (×11): qty 2

## 2012-09-29 MED ORDER — SODIUM CHLORIDE 0.9 % IJ SOLN: 3.0000 mL | INTRAMUSCULAR | Status: DC | PRN

## 2012-09-29 MED ORDER — ASPIRIN EC 325 MG PO TBEC
325.0000 mg | DELAYED_RELEASE_TABLET | Freq: Every day | ORAL | Status: DC
  Filled 2012-09-29: qty 1

## 2012-09-29 MED ORDER — THROMBIN 20000 UNITS EX SOLR
OROMUCOSAL | Status: DC | PRN
  Administered 2012-09-29 (×3): via TOPICAL

## 2012-09-29 MED ORDER — THROMBIN 20000 UNITS EX SOLR
CUTANEOUS | Status: DC | PRN
  Administered 2012-09-29: 20000 [IU] via TOPICAL

## 2012-09-29 MED ORDER — MIDAZOLAM HCL 5 MG/5ML IJ SOLN
INTRAMUSCULAR | Status: DC | PRN
  Administered 2012-09-29: 2 mg via INTRAVENOUS
  Administered 2012-09-29 (×2): 4 mg via INTRAVENOUS
  Administered 2012-09-29: 3 mg via INTRAVENOUS
  Administered 2012-09-29 (×2): 2 mg via INTRAVENOUS
  Administered 2012-09-29: 3 mg via INTRAVENOUS

## 2012-09-29 MED ORDER — METOPROLOL TARTRATE 25 MG/10 ML ORAL SUSPENSION
12.5000 mg | Freq: Two times a day (BID) | ORAL | Status: DC
  Filled 2012-09-29: qty 5

## 2012-09-29 MED ORDER — FAMOTIDINE IN NACL 20-0.9 MG/50ML-% IV SOLN
20.0000 mg | Freq: Two times a day (BID) | INTRAVENOUS | Status: AC
  Administered 2012-09-29: 20 mg via INTRAVENOUS

## 2012-09-29 MED ORDER — MOXIFLOXACIN HCL IN NACL 400 MG/250ML IV SOLN
400.0000 mg | INTRAVENOUS | Status: AC
  Administered 2012-09-30: 400 mg via INTRAVENOUS
  Filled 2012-09-29 (×2): qty 250

## 2012-09-29 MED ORDER — ACETAMINOPHEN 10 MG/ML IV SOLN
1000.0000 mg | Freq: Once | INTRAVENOUS | Status: AC
  Administered 2012-09-29: 1000 mg via INTRAVENOUS
  Filled 2012-09-29: qty 100

## 2012-09-29 MED ORDER — SODIUM CHLORIDE 0.9 % IV SOLN
INTRAVENOUS | Status: DC
  Administered 2012-09-29: 5.2 [IU]/h via INTRAVENOUS
  Administered 2012-09-29: 4.2 [IU]/h via INTRAVENOUS
  Administered 2012-09-29: 4.7 [IU]/h via INTRAVENOUS
  Filled 2012-09-29 (×2): qty 1

## 2012-09-29 MED ORDER — PROTAMINE SULFATE 10 MG/ML IV SOLN
INTRAVENOUS | Status: DC | PRN
  Administered 2012-09-29 (×3): 25 mg via INTRAVENOUS
  Administered 2012-09-29: 40 mg via INTRAVENOUS
  Administered 2012-09-29: 25 mg via INTRAVENOUS

## 2012-09-29 MED ORDER — HEMOSTATIC AGENTS (NO CHARGE) OPTIME
TOPICAL | Status: DC | PRN
  Administered 2012-09-29: 1 via TOPICAL

## 2012-09-29 MED ORDER — SODIUM CHLORIDE 0.9 % IV SOLN
INTRAVENOUS | Status: DC | PRN
  Administered 2012-09-29: 08:00:00 via INTRAVENOUS

## 2012-09-29 SURGICAL SUPPLY — 71 items
ADAPTER CARDIO PERF ANTE/RETRO (ADAPTER) ×2 IMPLANT
APPLICATOR COTTON TIP 6IN STRL (MISCELLANEOUS) ×2 IMPLANT
ATTRACTOMAT 16X20 MAGNETIC DRP (DRAPES) ×2 IMPLANT
BAG DECANTER FOR FLEXI CONT (MISCELLANEOUS) ×2 IMPLANT
BLADE STERNUM SYSTEM 6 (BLADE) ×2 IMPLANT
BLADE SURG 15 STRL LF DISP TIS (BLADE) ×1 IMPLANT
BLADE SURG 15 STRL SS (BLADE) ×1
CANISTER SUCTION 2500CC (MISCELLANEOUS) ×2 IMPLANT
CANNULA ARTERIAL NVNT 3/8 22FR (MISCELLANEOUS) ×2 IMPLANT
CANNULA GUNDRY RCSP 15FR (MISCELLANEOUS) ×2 IMPLANT
CATH ROBINSON RED A/P 18FR (CATHETERS) ×6 IMPLANT
CATH THORACIC 36FR (CATHETERS) ×2 IMPLANT
CATH THORACIC 36FR RT ANG (CATHETERS) ×2 IMPLANT
CLOTH BEACON ORANGE TIMEOUT ST (SAFETY) ×2 IMPLANT
CONT SPEC 4OZ CLIKSEAL STRL BL (MISCELLANEOUS) ×2 IMPLANT
CONT SPEC STER OR (MISCELLANEOUS) ×2 IMPLANT
COVER SURGICAL LIGHT HANDLE (MISCELLANEOUS) ×4 IMPLANT
CRADLE DONUT ADULT HEAD (MISCELLANEOUS) ×2 IMPLANT
DRAPE SLUSH MACHINE 52X66 (DRAPES) IMPLANT
DRAPE SLUSH/WARMER DISC (DRAPES) IMPLANT
DRSG COVADERM 4X14 (GAUZE/BANDAGES/DRESSINGS) ×2 IMPLANT
ELECT CAUTERY BLADE 6.4 (BLADE) ×2 IMPLANT
ELECT REM PT RETURN 9FT ADLT (ELECTROSURGICAL) ×4
ELECTRODE REM PT RTRN 9FT ADLT (ELECTROSURGICAL) ×2 IMPLANT
GLOVE BIO SURGEON STRL SZ 6 (GLOVE) ×4 IMPLANT
GLOVE BIO SURGEON STRL SZ 6.5 (GLOVE) ×8 IMPLANT
GLOVE BIO SURGEON STRL SZ7 (GLOVE) IMPLANT
GLOVE BIO SURGEON STRL SZ7.5 (GLOVE) IMPLANT
GLOVE BIOGEL PI IND STRL 6 (GLOVE) ×4 IMPLANT
GLOVE BIOGEL PI INDICATOR 6 (GLOVE) ×4
GLOVE EUDERMIC 7 POWDERFREE (GLOVE) ×4 IMPLANT
GOWN PREVENTION PLUS XLARGE (GOWN DISPOSABLE) ×2 IMPLANT
GOWN STRL NON-REIN LRG LVL3 (GOWN DISPOSABLE) ×8 IMPLANT
HEART VENT LT CURVED (MISCELLANEOUS) ×2 IMPLANT
HEMOSTAT POWDER SURGIFOAM 1G (HEMOSTASIS) ×6 IMPLANT
HEMOSTAT SURGICEL 2X14 (HEMOSTASIS) ×2 IMPLANT
INSERT FOGARTY XLG (MISCELLANEOUS) ×2 IMPLANT
KIT BASIN OR (CUSTOM PROCEDURE TRAY) ×2 IMPLANT
KIT CATH CPB BARTLE (MISCELLANEOUS) ×2 IMPLANT
KIT ROOM TURNOVER OR (KITS) ×2 IMPLANT
KIT SUCTION CATH 14FR (SUCTIONS) ×2 IMPLANT
LINE VENT (MISCELLANEOUS) ×2 IMPLANT
NS IRRIG 1000ML POUR BTL (IV SOLUTION) ×14 IMPLANT
PACK OPEN HEART (CUSTOM PROCEDURE TRAY) ×2 IMPLANT
PAD ARMBOARD 7.5X6 YLW CONV (MISCELLANEOUS) ×4 IMPLANT
SENSOR MYOCARDIAL TEMP (MISCELLANEOUS) ×2 IMPLANT
SET CARDIOPLEGIA MPS 5001102 (MISCELLANEOUS) ×2 IMPLANT
SPONGE GAUZE 4X4 12PLY (GAUZE/BANDAGES/DRESSINGS) ×2 IMPLANT
SUT BONE WAX W31G (SUTURE) ×2 IMPLANT
SUT ETHIBON 2 0 V 52N 30 (SUTURE) ×4 IMPLANT
SUT ETHIBOND 2 0 SH (SUTURE) ×1
SUT ETHIBOND 2 0 SH 36X2 (SUTURE) ×1 IMPLANT
SUT PROLENE 3 0 SH 1 (SUTURE) ×2 IMPLANT
SUT PROLENE 3 0 SH DA (SUTURE) IMPLANT
SUT PROLENE 4 0 RB 1 (SUTURE) ×5
SUT PROLENE 4-0 RB1 .5 CRCL 36 (SUTURE) ×5 IMPLANT
SUT STEEL 6MS V (SUTURE) IMPLANT
SUT STEEL SZ 6 DBL 3X14 BALL (SUTURE) ×6 IMPLANT
SUT VIC AB 1 CTX 36 (SUTURE) ×3
SUT VIC AB 1 CTX36XBRD ANBCTR (SUTURE) ×3 IMPLANT
SUTURE E-PAK OPEN HEART (SUTURE) ×2 IMPLANT
SYSTEM SAHARA CHEST DRAIN ATS (WOUND CARE) ×2 IMPLANT
TAPE CLOTH SURG 4X10 WHT LF (GAUZE/BANDAGES/DRESSINGS) ×2 IMPLANT
TAPE PAPER 2X10 WHT MICROPORE (GAUZE/BANDAGES/DRESSINGS) ×2 IMPLANT
TOWEL OR 17X24 6PK STRL BLUE (TOWEL DISPOSABLE) ×4 IMPLANT
TOWEL OR 17X26 10 PK STRL BLUE (TOWEL DISPOSABLE) ×4 IMPLANT
TRAY FOLEY IC TEMP SENS 14FR (CATHETERS) ×2 IMPLANT
TUBE SUCT INTRACARD DLP 20F (MISCELLANEOUS) ×2 IMPLANT
UNDERPAD 30X30 INCONTINENT (UNDERPADS AND DIAPERS) ×2 IMPLANT
VALVE REGENT 23MM (Valve) ×2 IMPLANT
WATER STERILE IRR 1000ML POUR (IV SOLUTION) ×4 IMPLANT

## 2012-09-29 NOTE — Anesthesia Postprocedure Evaluation (Signed)
  Anesthesia Post-op Note  Patient: Joseph Hoffman  Procedure(s) Performed: Procedure(s) (LRB) with comments: AORTIC VALVE REPLACEMENT (AVR) (N/A)  Patient Location: SICU  Anesthesia Type: general   Level of Consciousness: sedated and Patient remains intubated per anesthesia plan  Airway and Oxygen Therapy: Patient Spontanous Breathing and Patient connected to nasal cannula oxygen  Post-op Pain: none  Post-op Assessment: Post-op Vital signs reviewed and Patient's Cardiovascular Status Stable  Post-op Vital Signs: stable  Complications: No apparent anesthesia complications

## 2012-09-29 NOTE — Anesthesia Preprocedure Evaluation (Addendum)
Anesthesia Evaluation  Patient identified by MRN, date of birth, ID band Patient awake    Reviewed: Allergy & Precautions, H&P , NPO status , Patient's Chart, lab work & pertinent test results, reviewed documented beta blocker date and time   History of Anesthesia Complications Negative for: history of anesthetic complications  Airway Mallampati: II TM Distance: >3 FB Neck ROM: Full    Dental  (+) Teeth Intact and Dental Advisory Given   Pulmonary asthma , sleep apnea and Continuous Positive Airway Pressure Ventilation ,  breath sounds clear to auscultation        Cardiovascular hypertension, Pt. on medications and Pt. on home beta blockers + Past MI + dysrhythmias Atrial Fibrillation + Valvular Problems/Murmurs AS Rhythm:Irregular Rate:Normal  EKG on 09/25/12 showed afib, right superior axis deviation, non-specific intraventricular block, RVH.   Cardiac cath on 09/18/12 showed: 1. Severe calcific aortic stenosis with a peak to peak gradient of 65 mm, mean gradient of 50 mm, and calculated aortic valve area at 0.9-1.0 square cm.   2. Mild left ventricular dysfunction with an ejection fraction  of 45%.   3. No significant coronary obstructive disease.    Echo on 08/22/12 [facility]) showed technically difficult study. A. fib with controlled ventricular rate, intraventricular conduction delay, right bundle branch block.  EF 40-45%. LA was severely dilated. Calcification of the anterior posterior mitral valve leaflets. Calcified mitral apparatus. No significant mitral valve stenosis. Right ventricular systolic pressure was normal. Moderate to severe calcified predominant non-coronary cusp. Mild to moderate aortic regurgitation. Severe valvular aortic stenosis. There was aortic root sclerosis/calcification.     Neuro/Psych Anxiety negative neurological ROS     GI/Hepatic (+) Hepatitis -, B  Endo/Other  diabetes, Well Controlled, Type 2,  Insulin Dependent and Oral Hypoglycemic Agents  Renal/GU   negative genitourinary   Musculoskeletal negative musculoskeletal ROS (+)   Abdominal (+) + obese,   Peds  Hematology   Anesthesia Other Findings   Reproductive/Obstetrics                        Anesthesia Physical Anesthesia Plan  ASA: III  Anesthesia Plan: General   Post-op Pain Management:    Induction: Intravenous  Airway Management Planned: Oral ETT  Additional Equipment: Arterial line, PA Cath, 3D TEE, CVP and Ultrasound Guidance Line Placement  Intra-op Plan:   Post-operative Plan: Post-operative intubation/ventilation  Informed Consent: I have reviewed the patients History and Physical, chart, labs and discussed the procedure including the risks, benefits and alternatives for the proposed anesthesia with the patient or authorized representative who has indicated his/her understanding and acceptance.     Plan Discussed with: CRNA and Surgeon  Anesthesia Plan Comments:         Anesthesia Quick Evaluation

## 2012-09-29 NOTE — Interval H&P Note (Signed)
History and Physical Interval Note:  09/29/2012 7:07 AM  Joseph Hoffman  has presented today for surgery, with the diagnosis of AS  The various methods of treatment have been discussed with the patient and family. After consideration of risks, benefits and other options for treatment, the patient has consented to  Procedure(s) (LRB) with comments: AORTIC VALVE REPLACEMENT (AVR) (N/A) as a surgical intervention .  The patient's history has been reviewed, patient examined, no change in status, stable for surgery.  I have reviewed the patient's chart and labs.  Questions were answered to the patient's satisfaction.     Gaye Pollack

## 2012-09-29 NOTE — OR Nursing (Signed)
11:15am - called vol. Desk to inform family off pump, 1st call to SICU.

## 2012-09-29 NOTE — Brief Op Note (Signed)
09/29/2012  11:53 AM  PATIENT:  Joseph Hoffman  61 y.o. male  PRE-OPERATIVE DIAGNOSIS:  AS  POST-OPERATIVE DIAGNOSIS:  AS  PROCEDURE:  Procedure(s) (LRB) with comments: AORTIC VALVE REPLACEMENT (AVR) (N/A)  SURGEON:  Surgeon(s) and Role:    * Gaye Pollack, MD - Primary  PHYSICIAN ASSISTANT: none  ASSISTANTS: Alcide Goodness, RNFA   ANESTHESIA:   general  EBL:  Total I/O In: [id number] [I.V.:3900; Blood:866; IV Piggyback:500] Out: 2175 [Urine:575; Blood:1600]  BLOOD ADMINISTERED:none  DRAINS: 2 Chest Tube(s) in the mediastinum   LOCAL MEDICATIONS USED:  NONE  SPECIMEN:  No Specimen  DISPOSITION OF SPECIMEN:  N/A  COUNTS:  YES  TOURNIQUET:  * No tourniquets in log *  PLAN OF CARE: Admit to inpatient   PATIENT DISPOSITION:  ICU - intubated and hemodynamically stable.   Delay start of Pharmacological VTE agent (>24hrs) due to surgical blood loss or risk of bleeding: yes

## 2012-09-29 NOTE — H&P (Signed)
[address]            [place] [place] [address]          [phone]     Reason for Consult: Severe aortic stenosis  Referring Physician: Dr. Shelva Majestic  Massie Silence is an 61 y.o. male.  HPI:  The patient is a 61 year old gentleman who is originally from [place], [place] and has a long-standing history of chronic atrial fibrillation dating back to 1994. He said he had been treated with digoxin in the past and has been maintained on Coumadin. He was referred to cardiology in March of 2013 due to some chest discomfort and abnormal EKG. He was noted to have a heart murmur which had never been mentioned before. An echocardiogram showed moderately severe aortic stenosis with a mean gradient of 53 and a peak gradient of 85 with a calculated aortic valve area of 1.0. He had severe dilatation of the left atrium and moderately severe dilatation of the right atrium with an ejection fraction of 45-50%. A repeat echocardiogram on 08/22/2012 showed a calcified aortic valve with further progression of his aortic stenosis with a mean gradient of 69 and a peak gradient of 104 and a calculated aortic valve area of 0.85 cm. There is mild mitral regurgitation and mitral annular calcification. EF is still 45-50%. He says he did not have any symptoms until the past week or so when he has noticed some tightness in the left chest and neck.  PMH:  Chronic Atrial Fibrillation since 1994  Diabetes  Sleep apnea  PSH:  Laparoscopic cholecystectomy in past year  Family hx:  Mother had AVR in her 81's  Father had CABG  Social History: does not have a smoking history on file. He does not have any smokeless tobacco history on file. His alcohol and drug histories not on file.  Allergies:  Allergies   Allergen  Reactions   .  Iohexol  Anaphylaxis   .  Penicillins  Other (See Comments)     Unknown.[name]   Medications:  I have reviewed the patient's current  medications.  Prior to Admission:  Prescriptions prior to admission   Medication  Sig  Dispense  Refill   .  amLODipine-valsartan (EXFORGE) 5-320 MG per tablet  Take 0.5 tablets by mouth every evening.     .  Canagliflozin (INVOKANA) 300 MG TABS  Take 1 tablet by mouth every morning.     .  cholecalciferol (VITAMIN D) 1000 UNITS tablet  Take 1,000 Units by mouth daily.     .  Chromium 200 MCG CAPS  Take 200 mcg by mouth daily.     .  digoxin (LANOXIN) 0.25 MG tablet  Take 0.375 mg by mouth every evening.     .  escitalopram (LEXAPRO) 10 MG tablet  Take 10 mg by mouth every evening.     .  Glucosamine-Chondroit-Vit C-Mn (GLUCOSAMINE 1500 COMPLEX PO)  Take 1,500 mg by mouth daily.     [name]  glyBURIDE (DIABETA) 5 MG tablet  Take 5 mg by mouth daily with breakfast.     .  insulin glargine (LANTUS) 100 UNIT/ML injection  Inject 5-20 Units into the skin 2 (two) times daily. 5-20 units at breakfast and dinner depending on blood sugar levels     .  Liraglutide (VICTOZA) 18 MG/3ML SOLN  Inject 18 mg  into the skin daily with breakfast.     .  metFORMIN (GLUCOPHAGE-XR) 500 MG 24 hr tablet  Take 2,000 mg by mouth at bedtime.     .  Multiple Vitamins-Minerals (CENTRUM SPECIALIST HEART PO)  Take 1 tablet by mouth 2 (two) times daily.     .  Niacin-Simvastatin (SIMCOR PO)  Take 1 tablet by mouth at bedtime.     [name]  warfarin (COUMADIN) 10 MG tablet  Take 10-15 mg by mouth every evening. 10 mg mon thru sat and 15 mg on sun     .  albuterol (PROVENTIL HFA;VENTOLIN HFA) 108 (90 BASE) MCG/ACT inhaler  Inhale 2 puffs into the lungs every 6 (six) hours as needed. For shortness of breath     .  Fluticasone-Salmeterol (ADVAIR) 250-50 MCG/DOSE AEPB  Inhale 1 puff into the lungs 2 (two) times daily as needed. For asthma related symptoms     .  hydrochlorothiazide (HYDRODIURIL) 25 MG tablet  Take 25 mg by mouth daily.     Scheduled:  .  aspirin      .  aspirin  324 mg  Oral  Pre-Cath   .  aspirin EC  325 mg  Oral  Daily     .  diazepam      .  diazepam  5 mg  Oral  On Call   .  diphenhydrAMINE      .  diphenhydrAMINE  25 mg  Intravenous  Once   .  famotidine  20 mg  Intravenous  Once   .  fentaNYL      .  heparin      .  lidocaine      .  midazolam      .  nitroGLYCERIN      .  sodium chloride  3 mL  Intravenous  Q12H   Continuous:  .  sodium chloride  75 mL/hr at 09/18/12 0819   .  sodium chloride    [id number] chloride, acetaminophen, ondansetron (ZOFRAN) IV, sodium chloride  Anti-infectives    None      Results for orders placed during the hospital encounter of 09/18/12 (from the past 48 hour(s))   PROTIME-INR Status: Normal    Collection Time    09/18/12 7:19 AM   Component  Value  Range  Comment    Prothrombin Time  13.0  11.6 - 15.2 seconds     INR  0.99  0.00 - 1.49    GLUCOSE, CAPILLARY Status: Abnormal    Collection Time    09/18/12 7:33 AM   Component  Value  Range  Comment    Glucose-Capillary  297 (*)  70 - 99 mg/dL     Comment 1  Documented in Chart      Comment 2  Notify RN     GLUCOSE, CAPILLARY Status: Abnormal    Collection Time    09/18/12 10:41 AM   Component  Value  Range  Comment    Glucose-Capillary  285 (*)  70 - 99 mg/dL    No results found.  Review of Systems  Constitutional: Positive for weight loss. Negative for fever, chills, malaise/fatigue and diaphoresis.  HENT: Negative.  Eyes: Negative.  Respiratory: Negative.  Cardiovascular: Positive for chest pain. Negative for orthopnea, leg swelling and PND.  Gastrointestinal: Negative.  Genitourinary: Negative.  Musculoskeletal: Negative.  Skin: Negative.  Neurological: Negative. Negative for weakness.  Endo/Heme/Allergies: Negative.  Psychiatric/Behavioral: Negative.  Blood pressure 149/97, pulse 81, temperature 99.3 F (  37.4 C), temperature source Oral, resp. rate 20, height 5\' 11"  (1.803 m), weight 117.935 kg (260 lb), SpO2 96.00%.  Physical Exam  Constitutional: He is oriented to person, place, and time.  He appears well-developed and well-nourished. No distress.  HENT:  Head: Normocephalic and atraumatic.  Mouth/Throat: Oropharynx is clear and moist.  Eyes: Conjunctivae normal and EOM are normal. Pupils are equal, round, and reactive to light.  Neck: Normal range of motion. Neck supple. No JVD present. No thyromegaly present.  Cardiovascular: Normal rate and intact distal pulses.  Murmur heard. Irregularly irregular rhythm. 3/6 harsh systolic murmur over aorta.  Respiratory: Effort normal and breath sounds normal.  GI: Bowel sounds are normal. He exhibits no distension and no mass. There is no tenderness.  Musculoskeletal: He exhibits no edema.  Neurological: He is alert and oriented to person, place, and time. He has normal strength. No cranial nerve deficit or sensory deficit.  Skin: Skin is warm and dry.  Psychiatric: He has a normal mood and affect.  Cardiac Cath:  RA: 20 (Corrected due to re zero) 10  RV: 50/13; corrected 40/10  PA: 50/29; corrected 40/19  PC: 29 ; Corrected 19  Pullback:  LV: 188/14  AO: 123/60  Peak to peak gradient: 65 mm Hg; mean gradient 50 mm Hg  CO: Thermo 6.9; Fick 6.2 l/m  CI: 2.9; 2.6l/m/m2  AVA: 1.0cm2 (Thermo); 0.9 cm2(Fick)  EF 45%  Calcified AV with reduced excursion.  No sig AR  No significant CAD  REC: AVR  Assessment/Plan:  He has progressed to severe aortic stenosis and may be starting to develop symptoms. I agree that aortic valve replacement is indicated to prevent progressive left ventricular dysfunction and acute decompensation or sudden death. Since he has chronic atrial fibrillation and will remain on Coumadin and is only 61 years old I will plan on using a mechanical valve. I discussed the pros and cons of mechanical and tissue valves with the patient and his wife and he is in agreement with using a mechanical valve. He has a long history of chronic atrial fibrillation dating back to 1994 with severely dilated left and right atria. It is  very unlikely that he would maintain sinus rhythm even with a Maze procedure. Since his rate is well -controlled and he is going to be anticoagulated I would not plan any further intervention for his atrial fibrillation. I discussed the operative procedure with the patient and family including alternatives, benefits and risks; including but not limited to bleeding, blood transfusion, infection, stroke, myocardial infarction, graft failure, heart block requiring a permanent pacemaker, organ dysfunction, and death. Earleen Newport understands and agrees to proceed. We will schedule surgery for Monday 09/29/2012 at the patient's request. He will be started on Xarelto for his atrial fibrillation and that will be discontinued 3 days preop.  Kamiyah Kindel K  09/18/2012, 2:33 PM

## 2012-09-29 NOTE — OR Nursing (Signed)
12:00 noon - 2nd call to SICU.

## 2012-09-29 NOTE — Addendum Note (Signed)
Addendum  created 09/29/12 1833 by Roberts Gaudy, MD   Modules edited:Notes Section

## 2012-09-29 NOTE — Progress Notes (Signed)
S/p AVR  Extubated, comfortable  BP 105/43  Pulse 80  Temp 100.2 F (37.9 C) (Oral)  Resp 15  Wt 262 lb 5.6 oz (119 kg)  SpO2 98%   Intake/Output Summary (Last 24 hours) at 09/29/12 1904 Last data filed at 09/29/12 1700  Gross per 24 hour  Intake 6214.3 ml  Output   2715 ml  Net 3499.3 ml    Stable s/p AVR CBG well controlled with insulin gtt

## 2012-09-29 NOTE — Procedures (Signed)
Extubation Procedure Note  Patient Details:   Name: Joseph Hoffman DOB: 1951-04-13 MRN: [id number]   Airway Documentation:  AIRWAYS 8.5 mm (Active)  Secured at (cm) 23 cm 09/29/2012 12:00 AM    Evaluation  O2 sats: stable throughout and currently acceptable Complications: No apparent complications Patient did tolerate procedure well. Bilateral Breath Sounds: Clear   Yes Pt awake and alert. Extubated per protocol, placed on 3L [place], sat 98%. Positive cuff leak, NIF -36, VC 1450, IS [address], BBS cl. Pt able to vocalize.  Vallery Sa 09/29/2012, 5:23 PM

## 2012-09-29 NOTE — CV Procedure (Signed)
Intraoperative transesophageal echocardiography report:  Mr. Greysyn Weidinger is a 61 year old male with a history of chronic atrial fibrillation and aortic stenosis who is scheduled to undergo aortic valve replacement by [name]. Intraoperative transesophageal echocardiography was requested to evaluate the aortic valve and to serve as a monitor for intraoperative volume status.  The patient was brought to the operating room at [facility] and general anesthesia was induced without difficulty. Following eventful endotracheal intubation and orogastric suctioning, the transesophageal echocardiography probe was inserted into the esophagus without difficulty.  Impression: Pre-bypass findings:  1. Aortic valve: The aortic valve was tri-leaflet. The leaflets were  thickened and heavily calcified with severe restriction to opening. There was 1-2+ aortic insufficiency noted. The aortic annulus measured 2.3 cm in diameter. Aortic valve area using the VTI method was 0.59 cm and 0.65 cm using the V-max method. Peak velocity across the aortic valve was 4.29 m/s. This resulted in a maximum instantaneous gradient of 74 mm of mercury and a mean gradient of 45 mm of mercury.  2. Mitral valve was moderate mitral annular calcification noted in the bases of the posterior and anterior leaflets. There was trace to 1+ mitral insufficiency. There were no prolapsing or flail segments noted.  3. Left ventricle: There was moderate left ventricular dilatation and moderate left ventricular hypertrophy. There was global left ventricular hypokinesis which was graded as mild. Ejection fraction was estimated at 45-50%. There were no regional wall motion abnormalities. Left ventricular wall thickness measured 1.15-1.2 cm at end diastole at the mid-papillary level. There was no thrombus noted in the left ventricular apex.  4. Right ventricle: The right ventricle appeared to be of normal size with adequate contractility the  right ventricular free wall.  5. Tricuspid valve: The tricuspid valve appeared structurally normal with trace to 1+ tricuspid insufficiency.  6. Right atrium and interatrial septum: The right atrium appeared enlarged. There was no thrombus noted in the right atrium. The interatrial septum was intact without evidence of patent foramen ovale or atrial septal defect by color Doppler or bubble study.  7. Left atrium: The left atrial cavity was markedly enlarged and measured 6.4 cm in the mediolateral dimension and 5.3 cm in the superior inferior dimension. There was moderate spontaneous echo contrast or smoke noted in the left atrial cavity. There was no thrombus noted in the left atrium or left atrial appendage.  8. Ascending aorta: The was some calcification noted the wall of  ascending aorta but no protruding or mobile atheromata noted. The aortic root and proximal aorta was not dilated and there was no effacement of the sinuses of Valsalva.  9. Descending aorta: The descending aorta measured 2.4 cm in diameter and there was intermittent grade 1 plaque noted in the wall of the descending aorta.  Post-bypass findings:  1. Aortic valve: There was a bileaflet mechanical valve noted in the aortic position. Both leaflets appeared to open normally. There were mild jets of aortic insufficiency which were intra-valvular. There was no perivalvular aortic insufficiency. Continuous-wave Doppler interrogation of the aortic outflow revealed a mean gradient of 11 mm of mercury.  2. Mitral valve: There was 1+ mitral insufficiency with a central jet and moderate mitral annular calcification which was unchanged from the pre-bypass study.  3. Left ventricle: There was a somewhat dyssynchronous contractile pattern due to  ventricular pacing. There were no regional wall motion abnormalities noted. Ejection fraction was estimated at 50%.  4. Right ventricle: The right ventricular cavity appeared to be of  normal size  with adequate contractility of the right ventricular free wall.  5. Tricuspid valve: There was trace to 1+ tricuspid insufficiency noted  Roberts Gaudy, MD

## 2012-09-29 NOTE — Progress Notes (Signed)
  Echocardiogram Echocardiogram Transesophageal has been performed.  Quill Grinder, [place] 09/29/2012, 9:45 AM

## 2012-09-29 NOTE — Preoperative (Signed)
Beta Blockers   Reason not to administer Beta Blockers:Metoprolol 09/29/12 AM

## 2012-09-29 NOTE — Transfer of Care (Signed)
Immediate Anesthesia Transfer of Care Note  Patient: Joseph Hoffman  Procedure(s) Performed: Procedure(s) (LRB) with comments: AORTIC VALVE REPLACEMENT (AVR) (N/A)  Patient Location: SICU  Anesthesia Type:General  Level of Consciousness: sedated, unresponsive and Patient remains intubated per anesthesia plan  Airway & Oxygen Therapy: Patient remains intubated per anesthesia plan and Patient placed on Ventilator (see vital sign flow sheet for setting)  Post-op Assessment: Report given to PACU RN and Post -op Vital signs reviewed and stable  Post vital signs: Reviewed and stable  Complications: No apparent anesthesia complications

## 2012-09-29 NOTE — Progress Notes (Signed)
. [facility] Progress Note  Subjective:  Back from OR for AVR; under anesthesia.  Objective:   Vital Signs in the last 24 hours: Temp:  [98.3 F (36.8 C)] 98.3 F (36.8 C) (10/28 0552) Pulse Rate:  [78-81] 78  (10/28 1231) Resp:  [16-18] 18  (10/28 1231) BP: (106-116)/(46-72) 106/46 mmHg (10/28 1231) SpO2:  [96 %] 96 % (10/28 1231) FiO2 (%):  [50 %] 50 % (10/28 1231)  Intake/Output from previous day:    Scheduled:   . acetaminophen  1,000 mg Intravenous Once  . acetaminophen  1,000 mg Oral Q6H   Or  . acetaminophen (TYLENOL) oral liquid 160 mg/5 mL  975 mg Per Tube Q6H  . aminocaproic acid (AMICAR) for OHS   Intravenous To OR  . aspirin EC  325 mg Oral Daily   Or  . aspirin  324 mg Per Tube Daily  . bisacodyl  10 mg Oral Daily   Or  . bisacodyl  10 mg Rectal Daily  . dexmedetomidine  0.1-0.7 mcg/kg/hr Intravenous To OR  . docusate sodium  200 mg Oral Daily  . famotidine (PEPCID) IV  20 mg Intravenous Q12H  . insulin (NOVOLIN-R) infusion   Intravenous To OR  . insulin regular  0-10 Units Intravenous TID WC  . magnesium sulfate  4 g Intravenous Once  . metoprolol tartrate  12.5 mg Oral BID   Or  . metoprolol tartrate  12.5 mg Per Tube BID  . metoprolol tartrate  12.5 mg Oral Once  . moxifloxacin  400 mg Intravenous To OR  . moxifloxacin  400 mg Intravenous Q24H  . nitroGLYCERIN  2-200 mcg/min Intravenous To OR  . pantoprazole  40 mg Oral Daily  . potassium chloride  10 mEq Intravenous Q1 Hr x 3  . sodium chloride  3 mL Intravenous Q12H  . vancomycin  1,000 mg Intravenous Once  . DISCONTD: DOPamine  2-20 mcg/kg/min Intravenous To OR  . DISCONTD: epinephrine  0.5-20 mcg/min Intravenous To OR  . DISCONTD: magnesium sulfate  40 mEq Other To OR  . DISCONTD: phenylephrine (NEO-SYNEPHRINE) Adult infusion  30-200 mcg/min Intravenous To OR  . DISCONTD: potassium chloride  80 mEq Other To OR    Physical Exam:   Intubated; not yet  awake No JVD No wheezing RRR paced 1/6 sem, no AR; no rub Abd: soft Mild edema   Rate: 74  Rhythm: V paced  Lab Results:    Basename 09/29/12 1245  NA 139  K 3.8  CL --  CO2 --  GLUCOSE 187*  BUN --  CREATININE --   No results found for this basename: TROPONINI:2,CK,MB:2 in the last 72 hours Hepatic Function Panel No results found for this basename: PROT,ALBUMIN,AST,ALT,ALKPHOS,BILITOT,BILIDIR,IBILI in the last 72 hours  Basename 09/29/12 1259  INR 1.16    Lipid Panel  No results found for this basename: chol, trig, hdl, cholhdl, vldl, ldlcalc     Imaging:  Dg Chest Portable 1 View  09/29/2012  *RADIOLOGY REPORT*  Clinical Data: Status post valve surgery  PORTABLE CHEST - 1 VIEW  Comparison: 09/25/2012  Findings: Postoperative changes are now seen.  Endotracheal tube and nasogastric catheter seen in satisfactory position.  A mediastinal drain and left basilar drain are noted.  Swan-Ganz catheter is noted in the pulmonary outflow tract.  Lungs are clear. No pneumothorax is noted.  IMPRESSION: Postoperative change with tubes and lines as described above.  No acute abnormality is noted.   Original Report  Authenticated By: Everlene Farrier, M.D.       Assessment/Plan:   Active Problems:  s/p [employer] AVR for severe AS Chronic AF   Stable initial hemodynamics, off pressors.    Troy Sine, MD, [facility] 09/29/2012, 1:29 PM

## 2012-09-30 ENCOUNTER — Inpatient Hospital Stay (HOSPITAL_COMMUNITY): Payer: 59

## 2012-09-30 ENCOUNTER — Encounter (HOSPITAL_COMMUNITY): Payer: Self-pay | Admitting: *Deleted

## 2012-09-30 DIAGNOSIS — I4821 Permanent atrial fibrillation: Secondary | ICD-10-CM

## 2012-09-30 DIAGNOSIS — E119 Type 2 diabetes mellitus without complications: Secondary | ICD-10-CM

## 2012-09-30 DIAGNOSIS — Z7901 Long term (current) use of anticoagulants: Secondary | ICD-10-CM

## 2012-09-30 DIAGNOSIS — G4733 Obstructive sleep apnea (adult) (pediatric): Secondary | ICD-10-CM | POA: Diagnosis present

## 2012-09-30 DIAGNOSIS — IMO0001 Reserved for inherently not codable concepts without codable children: Secondary | ICD-10-CM | POA: Diagnosis present

## 2012-09-30 DIAGNOSIS — Z952 Presence of prosthetic heart valve: Secondary | ICD-10-CM

## 2012-09-30 HISTORY — DX: Type 2 diabetes mellitus without complications: E11.9

## 2012-09-30 HISTORY — DX: Permanent atrial fibrillation: I48.21

## 2012-09-30 HISTORY — DX: Presence of prosthetic heart valve: Z95.2

## 2012-09-30 LAB — CREATININE, SERUM
Creatinine, Ser: 0.74 mg/dL (ref 0.50–1.35)
GFR calc non Af Amer: >90 mL/min (ref >90)

## 2012-09-30 LAB — POCT I-STAT, CHEM 8
BUN: 14 mg/dL (ref 6–23)
Chloride: 102 mEq/L (ref 96–112)
Creatinine, Ser: 0.9 mg/dL (ref 0.50–1.35)
Sodium: 137 mEq/L (ref 135–145)

## 2012-09-30 LAB — CBC
Hemoglobin: 11.3 g/dL — ABNORMAL LOW (ref 13.0–17.0)
MCH: 28.4 pg (ref 26.0–34.0)
MCH: 28.4 pg (ref 26.0–34.0)
MCHC: 33.3 g/dL (ref 30.0–36.0)
MCHC: 32.8 g/dL (ref 30.0–36.0)
MCV: 85.2 fL (ref 78.0–100.0)
Platelets: 121 10*3/uL — ABNORMAL LOW (ref 150–400)
Platelets: 109 10*3/uL — ABNORMAL LOW (ref 150–400)
RBC: 3.98 MIL/uL — ABNORMAL LOW (ref 4.22–5.81)
RDW: 14.2 % (ref 11.5–15.5)
RDW: 14.4 % (ref 11.5–15.5)

## 2012-09-30 LAB — BASIC METABOLIC PANEL
CO2: 24 mEq/L (ref 19–32)
Calcium: 8.2 mg/dL — ABNORMAL LOW (ref 8.4–10.5)
Creatinine, Ser: 0.71 mg/dL (ref 0.50–1.35)
GFR calc non Af Amer: >90 mL/min (ref >90)

## 2012-09-30 LAB — GLUCOSE, CAPILLARY
Glucose-Capillary: 108 mg/dL — ABNORMAL HIGH (ref 70–99)
Glucose-Capillary: 108 mg/dL — ABNORMAL HIGH (ref 70–99)
Glucose-Capillary: 111 mg/dL — ABNORMAL HIGH (ref 70–99)
Glucose-Capillary: 115 mg/dL — ABNORMAL HIGH (ref 70–99)
Glucose-Capillary: 118 mg/dL — ABNORMAL HIGH (ref 70–99)
Glucose-Capillary: 121 mg/dL — ABNORMAL HIGH (ref 70–99)
Glucose-Capillary: 193 mg/dL — ABNORMAL HIGH (ref 70–99)

## 2012-09-30 LAB — MAGNESIUM
Magnesium: 2.3 mg/dL (ref 1.5–2.5)
Magnesium: 2 mg/dL (ref 1.5–2.5)

## 2012-09-30 MED ORDER — INFLUENZA VIRUS VACC SPLIT PF IM SUSP
0.5000 mL | INTRAMUSCULAR | Status: DC
  Filled 2012-09-30: qty 0.5

## 2012-09-30 MED ORDER — WARFARIN SODIUM 7.5 MG PO TABS
7.5000 mg | ORAL_TABLET | Freq: Once | ORAL | Status: AC
  Administered 2012-09-30: 7.5 mg via ORAL
  Filled 2012-09-30: qty 1

## 2012-09-30 MED ORDER — ASPIRIN EC 81 MG PO TBEC
81.0000 mg | DELAYED_RELEASE_TABLET | Freq: Every day | ORAL | Status: DC
  Administered 2012-09-30: 81 mg via ORAL
  Filled 2012-09-30 (×2): qty 1

## 2012-09-30 MED ORDER — INSULIN GLARGINE 100 UNIT/ML ~~LOC~~ SOLN
30.0000 [IU] | Freq: Every day | SUBCUTANEOUS | Status: DC
  Administered 2012-09-30: 30 [IU] via SUBCUTANEOUS

## 2012-09-30 MED ORDER — ASPIRIN 81 MG PO CHEW
324.0000 mg | CHEWABLE_TABLET | Freq: Every day | ORAL | Status: DC
  Administered 2012-10-01: 324 mg
  Filled 2012-09-30: qty 1

## 2012-09-30 MED ORDER — COUMADIN BOOK
Freq: Once | Status: AC
  Administered 2012-09-30: 10:00:00
  Filled 2012-09-30: qty 1

## 2012-09-30 MED ORDER — WARFARIN VIDEO: Freq: Once | Status: DC

## 2012-09-30 MED ORDER — WARFARIN - PHYSICIAN DOSING INPATIENT
Freq: Every day | Status: DC
  Administered 2012-10-01: 18:00:00

## 2012-09-30 MED ORDER — INSULIN GLARGINE 100 UNIT/ML ~~LOC~~ SOLN
24.0000 [IU] | Freq: Two times a day (BID) | SUBCUTANEOUS | Status: DC
  Administered 2012-09-30: 24 [IU] via SUBCUTANEOUS

## 2012-09-30 MED ORDER — INSULIN ASPART 100 UNIT/ML ~~LOC~~ SOLN
0.0000 [IU] | SUBCUTANEOUS | Status: DC
  Administered 2012-09-30: 4 [IU] via SUBCUTANEOUS
  Administered 2012-09-30: 2 [IU] via SUBCUTANEOUS

## 2012-09-30 MED FILL — Potassium Chloride Inj 2 mEq/ML: INTRAVENOUS | Qty: 40 | Status: AC

## 2012-09-30 MED FILL — Magnesium Sulfate Inj 50%: INTRAMUSCULAR | Qty: 10 | Status: AC

## 2012-09-30 NOTE — Op Note (Signed)
NAMECHRISTIANJAMES, MINSER NO.:  [id number]  MEDICAL RECORD NO.:  [id number]  LOCATION:  2304                         FACILITY:  [facility]  PHYSICIAN:  Gilford Raid, M.D.     DATE OF BIRTH:  03-14-1951  DATE OF PROCEDURE:  09/29/2012 DATE OF DISCHARGE:                              OPERATIVE REPORT   PREOPERATIVE DIAGNOSIS:  Severe aortic stenosis.  POSTOPERATIVE DIAGNOSIS:  Severe aortic stenosis.  OPERATIVE PROCEDURE:  Median sternotomy, extracorporeal circulation, aortic valve replacement using a 23-mm St. Jude Regent mechanical valve.  ATTENDING SURGEON:  Gilford Raid, MD  ASSISTANT:  Dineen Kid, RNFA.  ANESTHESIA:  General endotracheal.  CLINICAL HISTORY:  This patient is a 61 year old gentleman with a longstanding history of chronic atrial fibrillation dating back to 1994, treated with digoxin for rate control and anticoagulation with Coumadin. He was referred to Cardiology in March 2013 with some chest discomfort and abnormal EKG.  He was noted to have a heart murmur.  Echocardiogram showed moderately severe aortic stenosis with a mean gradient of 53 and a peak gradient of 85 with a calculated valve area of 1.0 cm2.  There was severe dilatation of left atrium and moderate severe dilatation of the right atrium with ejection fraction of 45-50%.  His most recent repeat echocardiogram in September 2013 showed a calcified aortic valve with further progression of his aortic stenosis with a mean gradient of 69 and a peak gradient of 104 and a valve area of 0.85 cm2.  Left ventricular function was well preserved.  There was mild mitral annular calcification, mild mitral regurgitation.  Ejection fraction was 45-50%. He had been denying any symptoms until the past few weeks when he has noticed some tightness in his left chest and neck.  After review of his studies and examination, the patient it was felt that it is time to proceed with aortic valve replacement.   Given his age of 61 and the fact that he was in longstanding chronic atrial fibrillation and would require lifelong anticoagulation with Coumadin, I thought a mechanical valve would be the best option.  I discussed the operative procedure with the patient and his wife including alternatives, benefits, and risks including, but not limited to, bleeding, blood transfusion, infection, stroke, myocardial infarction, heart block requiring permanent pacemaker, organ dysfunction, and death.  He understood and agreed to proceed.  OPERATIVE PROCEDURE:  The patient was taken to the operating room and placed on the table in supine position.  He was given preoperative intravenous vancomycin and Avelox.  After induction of general endotracheal anesthesia, a Foley catheter was placed in the bladder using sterile technique.  Then the chest, abdomen, and both lower extremities were prepped and draped in usual sterile manner.  The transesophageal echocardiogram showed severe calcific aortic stenosis with a valve area of around 0.65 cm2.  There was mild pulmonary hypertension.  There was no significant mitral regurgitation.  The left ventricle was enlarged and somewhat dilated but had good contractility.  Then, the chest was opened through a median sternotomy incision.  The pericardium opened in the midline.  Examination of the heart showed good ventricular contractility.  The ascending aorta was of normal  size and had no palpable plaques in it.  Then the patient was heparinized when an adequate ACT was obtained.  The distal ascending aorta was cannulated using a 22-French aortic cannula for arterial inflow.  Venous outflow was achieved using a two-stage venous cannula for the right atrial appendage and antegrade cardioplegia and vent cannula was inserted in the aortic root.  A left ventricular vent was placed through the right superior pulmonary vein.  The patient was placed on cardiopulmonary bypass.   He was cooled to 32 degrees centigrade.  The aorta was then crossclamped and 500 mL of cold blood antegrade cardioplegia was administered in the aortic root with quick arrest of the heart.  Topical hypothermic iced saline was used. Additional doses of cold blood retrograde cardioplegia were given throughout the procedure at about 20 minutes intervals to maintain myocardial temperature around 10 degrees centigrade or less.  Then, the aorta was opened transversely just above the sino-tubular junction.  Examination of the aortic valve showed that there were three leaflets that were heavily calcified, immobile and partially fused. There was mild annular calcification.  The right and left coronary ostia were identified and were not obstructed.  Then the native valve leaflets were excised.  The anulus was decalcified with rongeurs.  Care was taken to remove all particulate debris.  Left ventricle was irrigated with iced saline solution.  The anulus was sized and a 23 mm St. Jude Regent mechanical valve was chosen.  This had reference number [id number], serial [id number].  Then a series of pledgeted 2-0 Ethibond horizontal mattress sutures were placed around the aortic anulus with the pledgets in a subannular position.  The sutures were placed through the sewing ring and the valve lowered in place.  The sutures were tied sequentially.  The valve was seated nicely.  The right and left coronary ostia were not obstructed.  The patient was then rewarmed to 37 degrees centigrade.  The aortotomy was closed in 2 layers using continuous 4-0 Prolene suture with felt strips to reinforce closure.  Then the left side of the heart was de-aired.  We did use CO2 insufflation into the pericardial cavity throughout the procedure to minimize intracardiac air.  After de-airing maneuvers were performed, head was placed in Trendelenburg position and the crossclamp removed with time of 81 minutes.  The patient  developed ventricular fibrillation and was defibrillated into atrial fibrillation.  The aortotomy appeared hemostatic.  Two temporary right ventricular pacing wires were placed and brought through the skin.  We did not place any atrial pacing wires since he had been in chronic atrial fibrillation with very large atria and came back into atrial fibrillation immediately.  He was then paced at VVI at 56s.  He was weaned from cardiopulmonary bypass on no inotropic agents.  Total bypass time was 103 minutes.  TEE showed normal functioning aortic valve prosthesis.  There was no mitral regurgitation. Left ventricular function appeared well preserved.  There was no evidence of perivalvular leak or regurgitation.  Then protamine was given and the venous and aortic cannulas were removed without difficulty.  Cardiac output was 10 L per minute.  Hemostasis was achieved without difficulty.  Two mediastinal tubes were placed with one in the posterior pericardium and one in the anterior mediastinum.  The pericardium was loosely reapproximated over the heart.  The sternum was closed with double #6 stainless steel wires.  Fascia was closed with continuous #1 Vicryl suture.  Subcutaneous tissue was closed with continuous 2-0 Vicryl and  the skin with a 3-0 Vicryl subcuticular closure.  The sponge, needle, and instrument counts were correct according to the scrub nurse.  Dry sterile dressing was applied over the incision and around the chest tubes which were hooked to Pleur-Evac suction.  The patient remained hemodynamically stable and transferred to the SICU in guarded but stable condition.     Gilford Raid, M.D.     BB/MEDQ  D:  09/29/2012  T:  09/30/2012  Job:  [id number]  cc:   Shelva Majestic, M.D.

## 2012-09-30 NOTE — Progress Notes (Signed)
1 Day Post-Op Procedure(s) (LRB): AORTIC VALVE REPLACEMENT (AVR) (N/A) Subjective: No complaints   Objective: Vital signs in last 24 hours: Temp:  [96.6 F (35.9 C)-101.5 F (38.6 C)] 99 F (37.2 C) (10/29 0700) Pulse Rate:  [73-84] 81  (10/29 0700) Cardiac Rhythm:  [-] Ventricular paced (10/29 0000) Resp:  [7-22] 18  (10/29 0700) BP: (74-128)/(38-62) 107/62 mmHg (10/29 0700) SpO2:  [95 %-100 %] 98 % (10/29 0700) Arterial Line BP: (89-145)/(39-56) 129/50 mmHg (10/29 0700) FiO2 (%):  [39.7 %-50.7 %] 40 % (10/28 1700) Weight:  [119.1 kg (262 lb 9.1 oz)-127.1 kg (280 lb 3.3 oz)] 127.1 kg (280 lb 3.3 oz) (10/29 0530)  Hemodynamic parameters for last 24 hours: PAP: (22-47)/(12-25) 33/13 mmHg CO:  [4.5 L/min-5.3 L/min] 5.3 L/min CI:  [2 L/min/m2-2.3 L/min/m2] 2.3 L/min/m2  Intake/Output from previous day: 10/28 0701 - 10/29 0700 In: 7570.6 [P.O.:120; I.V.:5454.6; Blood:866; NG/GT:30; IV Piggyback:1100] Out: [id number] [Urine:1650; Emesis/NG output:100; Blood:1600; Chest Tube:430] Intake/Output this shift:    General appearance: alert and cooperative Neurologic: intact Heart: irregularly irregular rhythm Lungs: clear to auscultation bilaterally  Lab Results:  Basename 09/30/12 0410 09/29/12 2103 09/29/12 1900  WBC 15.2* -- 20.0*  HGB 11.3* 11.9* --  HCT 33.9* 35.0* --  PLT 121* -- 132*   BMET:  Basename 09/30/12 0410 09/29/12 2103  NA 139 141  K 4.2 4.7  CL 108 107  CO2 24 --  GLUCOSE 108* 114*  BUN 14 14  CREATININE 0.71 0.70  CALCIUM 8.2* --    PT/INR:  Basename 09/29/12 1259  LABPROT 14.6  INR 1.16   ABG    Component Value Date/Time   PHART 7.351 09/29/2012 2102   HCO3 24.1* 09/29/2012 2102   TCO2 22 09/29/2012 2103   ACIDBASEDEF 1.0 09/29/2012 2102   O2SAT 97.0 09/29/2012 2102   CBG (last 3)   Basename 09/30/12 0612 09/30/12 0520 09/30/12 0418  GLUCAP 108* 111* 102*    Assessment/Plan: S/P Procedure(s) (LRB): AORTIC VALVE REPLACEMENT (AVR)  (N/A) Wean off neo Mobilize Diuresis Diabetes control d/c tubes/lines Start coumadin for chronic A-fib and mechanical valve.   LOS: 1 day    Elyan Vanwieren K 09/30/2012

## 2012-09-30 NOTE — Care Management Note (Signed)
    Page 1 of 1   09/30/2012     2:28:50 PM   CARE MANAGEMENT NOTE 09/30/2012  Patient:  Joseph Hoffman, Joseph Hoffman   Account Number:  [id number]  Date Initiated:  09/30/2012  Documentation initiated by:  [facility]  Subjective/Objective Assessment:   Post op AVR on 09-29-12.  Has wife. Independent prior.     Action/Plan:   Anticipated DC Date:  10/06/2012   Anticipated DC Plan:  [place]  CM consult      Choice offered to / List presented to:             Status of service:  In process, will continue to follow Medicare Important Message given?   (If response is "NO", the following Medicare IM given date fields will be blank) Date Medicare IM given:   Date Additional Medicare IM given:    Discharge Disposition:    Per UR Regulation:  Reviewed for med. necessity/level of care/duration of stay  If discussed at [place] of Stay Meetings, dates discussed:    Comments:  09-30-12 2:20pm Luz Lex, RNBSWN [phone] Wife at bedside - plan at this time is discharge home with wife.  CM will continue to follow for any further needs.

## 2012-09-30 NOTE — Progress Notes (Signed)
1 Day Post-Op Procedure(s) (LRB):  AORTIC VALVE REPLACEMENT    Subjective: Sleepy, some discomfort but no specific complaints  Objective: Vital signs in last 24 hours: Temp:  [96.6 F (35.9 C)-101.5 F (38.6 C)] 99 F (37.2 C) (10/29 0700) Pulse Rate:  [73-84] 81  (10/29 0700) Resp:  [7-22] 18  (10/29 0700) BP: (74-128)/(38-62) 107/62 mmHg (10/29 0700) SpO2:  [95 %-100 %] 98 % (10/29 0700) Arterial Line BP: (89-145)/(39-56) 129/50 mmHg (10/29 0700) FiO2 (%):  [39.7 %-50.7 %] 40 % (10/28 1700) Weight:  [119.1 kg (262 lb 9.1 oz)-127.1 kg (280 lb 3.3 oz)] 127.1 kg (280 lb 3.3 oz) (10/29 0530) Weight change: 0.1 kg (3.5 oz)   Intake/Output from previous day: +3768  Wt 127 up from 119 pre-op 10/28 0701 - 10/29 0700 In: 7570.6 [P.O.:120; I.V.:5454.6; Blood:866; NG/GT:30; IV Piggyback:1100] Out: [id number] [Urine:1650; Emesis/NG output:100; Blood:1600; Chest Tube:430] Intake/Output this shift:    PE: General:alert and oriented Heart:S1S2 Irreg irreg Lungs:clear ant. No wheezes Abd:+ BS soft, non tender Ext:no edema Neuro:alert and oriented, sleepy   Hemodynamic parameters for last 24 hours:  PAP: (22-47)/(12-25) 33/13 mmHg  CO: [4.5 L/min-5.3 L/min] 5.3 L/min  CI: [2 L/min/m2-2.3 L/min/m2] 2.3 L/min/m2       Lab Results:  Basename 09/30/12 0410 09/29/12 2103 09/29/12 1900  WBC 15.2* -- 20.0*  HGB 11.3* 11.9* --  HCT 33.9* 35.0* --  PLT 121* -- 132*   BMET  Basename 09/30/12 0410 09/29/12 2103  NA 139 141  K 4.2 4.7  CL 108 107  CO2 24 --  GLUCOSE 108* 114*  BUN 14 14  CREATININE 0.71 0.70  CALCIUM 8.2* --     EKG: Orders placed during the hospital encounter of 09/29/12  . EKG 12-LEAD  . EKG 12-LEAD  . EKG 12-LEAD  . EKG 12-LEAD    Studies/Results: Dg Chest Portable 1 View In Am  09/30/2012  *RADIOLOGY REPORT*  Clinical Data: Status post aortic valve replacement.  PORTABLE CHEST - 1 VIEW  Comparison: Chest x-ray 09/29/2012.  Findings: The patient has  been extubated and the nasogastric tube has been removed.  Right IJ Cordis remains in position, and a Swan- Ganz catheter has been passed into the pulmonic trunk.  Mediastinal / pericardial drain is noted projecting over the mediastinum.  The lung volumes remain low.  Persistent opacity in the left base compatible with resolving postoperative atelectasis and small left pleural effusion. Probable trace right pleural effusion.  Pulmonary venous congestion without frank pulmonary edema.  Mild enlargement of the cardiopericardial silhouette is unchanged. The patient is rotated to the left on today's exam, resulting in distortion of the mediastinal contours and reduced diagnostic sensitivity and specificity for mediastinal pathology.  Status post median sternotomy for aortic valve replacement.  IMPRESSION: 1.  Support apparatus and postoperative changes, as above. 2.  Left lower lobe subsegmental atelectasis and small left pleural effusion is similar. 3.  Trace right pleural effusion.   Original Report Authenticated By: Etheleen Mayhew, M.D.    Dg Chest Portable 1 View  09/29/2012  *RADIOLOGY REPORT*  Clinical Data: Status post valve surgery  PORTABLE CHEST - 1 VIEW  Comparison: 09/25/2012  Findings: Postoperative changes are now seen.  Endotracheal tube and nasogastric catheter seen in satisfactory position.  A mediastinal drain and left basilar drain are noted.  Swan-Ganz catheter is noted in the pulmonary outflow tract.  Lungs are clear. No pneumothorax is noted.  IMPRESSION: Postoperative change with tubes and lines as described above.  No acute abnormality is noted.   Original Report Authenticated By: Everlene Farrier, M.D.    CABG and AVR: 09/29/12 OPERATIVE PROCEDURE: Median sternotomy, extracorporeal circulation,  aortic valve replacement using a 23-mm St. Jude Regent mechanical valve.   Medications: I have reviewed the patient's current medications.    [name] acetaminophen  1,000 mg Intravenous Once  .  acetaminophen  1,000 mg Oral Q6H   Or  . acetaminophen (TYLENOL) oral liquid 160 mg/5 mL  975 mg Per Tube Q6H  . aspirin EC  81 mg Oral Daily   Or  . aspirin  324 mg Per Tube Daily  . bisacodyl  10 mg Oral Daily   Or  . bisacodyl  10 mg Rectal Daily  . docusate sodium  200 mg Oral Daily  . famotidine (PEPCID) IV  20 mg Intravenous Q12H  . influenza  inactive virus vaccine  0.5 mL Intramuscular Tomorrow-1000  . insulin aspart  0-24 Units Subcutaneous Q4H  . insulin glargine  30 Units Subcutaneous Daily  . insulin regular  0-10 Units Intravenous TID WC  . magnesium sulfate  4 g Intravenous Once  . moxifloxacin  400 mg Intravenous Q24H  . pantoprazole  40 mg Oral Daily  . potassium chloride  10 mEq Intravenous Q1 Hr x 3  . sodium chloride  3 mL Intravenous Q12H  . vancomycin  1,000 mg Intravenous Once  . warfarin  7.5 mg Oral ONCE-1800  . Warfarin - Physician Dosing Inpatient   Does not apply q1800  . DISCONTD: aspirin  324 mg Per Tube Daily  . DISCONTD: aspirin EC  325 mg Oral Daily  . DISCONTD: DOPamine  2-20 mcg/kg/min Intravenous To OR  . DISCONTD: epinephrine  0.5-20 mcg/min Intravenous To OR  . DISCONTD: magnesium sulfate  40 mEq Other To OR  . DISCONTD: metoprolol tartrate  12.5 mg Per Tube BID  . DISCONTD: metoprolol tartrate  12.5 mg Per Tube BID  . DISCONTD: metoprolol tartrate  12.5 mg Oral BID  . DISCONTD: metoprolol tartrate  12.5 mg Oral BID  . DISCONTD: phenylephrine (NEO-SYNEPHRINE) Adult infusion  30-200 mcg/min Intravenous To OR  . DISCONTD: potassium chloride  80 mEq Other To OR   Assessment/Plan: Principal Problem:  *S/P AVR (aortic valve replacement), 09/29/12 Active Problems:  Severe aortic stenosis  Permanent atrial fibrillation, since 1994  DM (diabetes mellitus)  Sleep apnea  Asymptomatic LV dysfunction, EF 45%, normal coronary arteries on cardiac cath 09/18/12  Chronic anticoagulation, on Xarelto prior to admit  PLAN:  Post op day 1 Chronic Atrial  fib continues.  Extubated, off Neo, BP borderline stable,  Volume overload s/p surgery. Progressing approprietly, coumadin has been started.   LOS: 1 day   Joseph Hoffman,Joseph Hoffman 09/30/2012, 8:39 AM    Patient seen and examined. Agree with assessment and plan. Doing very well day 1 s/p St. Jude AVR for severe AS. To restart coumadin today. Now off Neosynephrine.   Troy Sine, MD, [facility] 09/30/2012 10:13 AM

## 2012-09-30 NOTE — Progress Notes (Signed)
[facility] BRIEF SICU PROGRESS NOTE  1 Day Post-Op  S/P Procedure(s) (LRB): AORTIC VALVE REPLACEMENT (AVR) (N/A)   Stable day Afib with HR 90-100 BP stable O2 sats 97% UOP adequate CBG's trending up  Plan: Continue current plan.  Will add pm dose of lantus insulin  Nayara Taplin H 09/30/2012 7:56 PM

## 2012-10-01 ENCOUNTER — Inpatient Hospital Stay (HOSPITAL_COMMUNITY): Payer: 59

## 2012-10-01 LAB — GLUCOSE, CAPILLARY
Glucose-Capillary: 233 mg/dL — ABNORMAL HIGH (ref 70–99)
Glucose-Capillary: 261 mg/dL — ABNORMAL HIGH (ref 70–99)
Glucose-Capillary: 234 mg/dL — ABNORMAL HIGH (ref 70–99)
Glucose-Capillary: 207 mg/dL — ABNORMAL HIGH (ref 70–99)
Glucose-Capillary: 186 mg/dL — ABNORMAL HIGH (ref 70–99)
Glucose-Capillary: 162 mg/dL — ABNORMAL HIGH (ref 70–99)
Glucose-Capillary: 276 mg/dL — ABNORMAL HIGH (ref 70–99)
Glucose-Capillary: 263 mg/dL — ABNORMAL HIGH (ref 70–99)

## 2012-10-01 LAB — BASIC METABOLIC PANEL
BUN: 15 mg/dL (ref 6–23)
CO2: 27 mEq/L (ref 19–32)
Chloride: 100 mEq/L (ref 96–112)
Glucose, Bld: 157 mg/dL — ABNORMAL HIGH (ref 70–99)
Potassium: 4.1 mEq/L (ref 3.5–5.1)
Sodium: 132 mEq/L — ABNORMAL LOW (ref 135–145)

## 2012-10-01 LAB — CBC
HCT: 32.4 % — ABNORMAL LOW (ref 39.0–52.0)
Hemoglobin: 10.8 g/dL — ABNORMAL LOW (ref 13.0–17.0)
MCH: 29 pg (ref 26.0–34.0)
MCHC: 33.3 g/dL (ref 30.0–36.0)
MCV: 86.9 fL (ref 78.0–100.0)
RBC: 3.73 MIL/uL — ABNORMAL LOW (ref 4.22–5.81)

## 2012-10-01 LAB — PROTIME-INR: Prothrombin Time: 15.8 seconds — ABNORMAL HIGH (ref 11.6–15.2)

## 2012-10-01 MED ORDER — TRAMADOL HCL 50 MG PO TABS: 50.0000 mg | ORAL_TABLET | ORAL | Status: DC | PRN

## 2012-10-01 MED ORDER — OXYCODONE HCL 5 MG PO TABS
5.0000 mg | ORAL_TABLET | ORAL | Status: DC | PRN
  Administered 2012-10-01 – 2012-10-03 (×3): 10 mg via ORAL
  Administered 2012-10-03: 5 mg via ORAL
  Administered 2012-10-03 – 2012-10-04 (×2): 10 mg via ORAL
  Filled 2012-10-01 (×5): qty 2
  Filled 2012-10-01: qty 1

## 2012-10-01 MED ORDER — PANTOPRAZOLE SODIUM 40 MG PO TBEC
40.0000 mg | DELAYED_RELEASE_TABLET | Freq: Every day | ORAL | Status: DC
  Administered 2012-10-02 – 2012-10-04 (×3): 40 mg via ORAL
  Filled 2012-10-01 (×3): qty 1

## 2012-10-01 MED ORDER — ONDANSETRON HCL 4 MG/2ML IJ SOLN: 4.0000 mg | Freq: Four times a day (QID) | INTRAMUSCULAR | Status: DC | PRN

## 2012-10-01 MED ORDER — SODIUM CHLORIDE 0.9 % IJ SOLN
3.0000 mL | Freq: Two times a day (BID) | INTRAMUSCULAR | Status: DC
  Administered 2012-10-02 – 2012-10-03 (×4): 3 mL via INTRAVENOUS

## 2012-10-01 MED ORDER — SODIUM CHLORIDE 0.9 % IV SOLN: 250.0000 mL | INTRAVENOUS | Status: DC | PRN

## 2012-10-01 MED ORDER — METFORMIN HCL 500 MG PO TABS
1000.0000 mg | ORAL_TABLET | Freq: Two times a day (BID) | ORAL | Status: DC
  Administered 2012-10-01 (×2): 1000 mg via ORAL
  Filled 2012-10-01 (×5): qty 2

## 2012-10-01 MED ORDER — SODIUM CHLORIDE 0.9 % IJ SOLN: 3.0000 mL | INTRAMUSCULAR | Status: DC | PRN

## 2012-10-01 MED ORDER — INSULIN GLARGINE 100 UNIT/ML ~~LOC~~ SOLN
30.0000 [IU] | Freq: Two times a day (BID) | SUBCUTANEOUS | Status: DC
  Administered 2012-10-01: 23:00:00 via SUBCUTANEOUS
  Administered 2012-10-01 – 2012-10-02 (×2): 30 [IU] via SUBCUTANEOUS

## 2012-10-01 MED ORDER — BISACODYL 5 MG PO TBEC: 10.0000 mg | DELAYED_RELEASE_TABLET | Freq: Every day | ORAL | Status: DC | PRN

## 2012-10-01 MED ORDER — METOPROLOL TARTRATE 25 MG PO TABS
25.0000 mg | ORAL_TABLET | Freq: Two times a day (BID) | ORAL | Status: DC
  Administered 2012-10-01 – 2012-10-04 (×7): 25 mg via ORAL
  Filled 2012-10-01 (×8): qty 1

## 2012-10-01 MED ORDER — FUROSEMIDE 40 MG PO TABS
40.0000 mg | ORAL_TABLET | Freq: Two times a day (BID) | ORAL | Status: AC
  Administered 2012-10-01 – 2012-10-03 (×4): 40 mg via ORAL
  Filled 2012-10-01 (×5): qty 1

## 2012-10-01 MED ORDER — POTASSIUM CHLORIDE CRYS ER 20 MEQ PO TBCR
40.0000 meq | EXTENDED_RELEASE_TABLET | Freq: Once | ORAL | Status: AC
  Administered 2012-10-01: 40 meq via ORAL
  Filled 2012-10-01: qty 2

## 2012-10-01 MED ORDER — ONDANSETRON HCL 4 MG PO TABS: 4.0000 mg | ORAL_TABLET | Freq: Four times a day (QID) | ORAL | Status: DC | PRN

## 2012-10-01 MED ORDER — ACETAMINOPHEN 325 MG PO TABS: 650.0000 mg | ORAL_TABLET | Freq: Four times a day (QID) | ORAL | Status: DC | PRN

## 2012-10-01 MED ORDER — ESCITALOPRAM OXALATE 10 MG PO TABS
10.0000 mg | ORAL_TABLET | Freq: Every evening | ORAL | Status: DC
  Administered 2012-10-01 – 2012-10-03 (×3): 10 mg via ORAL
  Filled 2012-10-01 (×4): qty 1

## 2012-10-01 MED ORDER — INSULIN ASPART 100 UNIT/ML ~~LOC~~ SOLN: 0.0000 [IU] | SUBCUTANEOUS | Status: DC

## 2012-10-01 MED ORDER — POTASSIUM CHLORIDE CRYS ER 20 MEQ PO TBCR
40.0000 meq | EXTENDED_RELEASE_TABLET | Freq: Every day | ORAL | Status: AC
  Administered 2012-10-02 – 2012-10-04 (×3): 40 meq via ORAL
  Filled 2012-10-01 (×3): qty 2

## 2012-10-01 MED ORDER — FUROSEMIDE 10 MG/ML IJ SOLN
40.0000 mg | Freq: Once | INTRAMUSCULAR | Status: AC
  Administered 2012-10-01: 40 mg via INTRAVENOUS
  Filled 2012-10-01: qty 4

## 2012-10-01 MED ORDER — MOVING RIGHT ALONG BOOK
Freq: Once | Status: AC
  Administered 2012-10-01: 12:00:00
  Filled 2012-10-01: qty 1

## 2012-10-01 MED ORDER — DOCUSATE SODIUM 100 MG PO CAPS
200.0000 mg | ORAL_CAPSULE | Freq: Every day | ORAL | Status: DC
  Administered 2012-10-01 – 2012-10-02 (×2): 200 mg via ORAL
  Filled 2012-10-01 (×4): qty 2

## 2012-10-01 MED ORDER — INSULIN ASPART 100 UNIT/ML ~~LOC~~ SOLN
0.0000 [IU] | SUBCUTANEOUS | Status: AC
  Administered 2012-10-01: 12 [IU] via SUBCUTANEOUS
  Administered 2012-10-01: 4 [IU] via SUBCUTANEOUS

## 2012-10-01 MED ORDER — INSULIN ASPART 100 UNIT/ML ~~LOC~~ SOLN
0.0000 [IU] | SUBCUTANEOUS | Status: DC
  Administered 2012-10-01: 8 [IU] via SUBCUTANEOUS
  Administered 2012-10-01: 12 [IU] via SUBCUTANEOUS
  Administered 2012-10-02: 16 [IU] via SUBCUTANEOUS
  Administered 2012-10-02 (×2): 12 [IU] via SUBCUTANEOUS
  Administered 2012-10-02 – 2012-10-03 (×2): 8 [IU] via SUBCUTANEOUS
  Administered 2012-10-03: 2 [IU] via SUBCUTANEOUS
  Administered 2012-10-03: 4 [IU] via SUBCUTANEOUS

## 2012-10-01 MED ORDER — BISACODYL 10 MG RE SUPP: 10.0000 mg | Freq: Every day | RECTAL | Status: DC | PRN

## 2012-10-01 MED ORDER — WARFARIN SODIUM 7.5 MG PO TABS
7.5000 mg | ORAL_TABLET | Freq: Once | ORAL | Status: AC
  Administered 2012-10-01: 7.5 mg via ORAL
  Filled 2012-10-01: qty 1

## 2012-10-01 NOTE — Progress Notes (Signed)
1440 Came to walk with pt. Pt states he is too exhausted to try to walk now. Had episode earlier he states when he went to bathroom and became weak. States sweat started dripping off of him. Said he had been tired every since. Encouraged pt to walk with staff later. Will follow up tomorrow. Graylon Good RN

## 2012-10-01 NOTE — Progress Notes (Signed)
2 Days Post-Op Procedure(s) (LRB): AORTIC VALVE REPLACEMENT (AVR) (N/A) Subjective: No complaints   Objective: Vital signs in last 24 hours: Temp:  [98.2 F (36.8 C)-99.7 F (37.6 C)] 98.7 F (37.1 C) (10/30 0730) Pulse Rate:  [52-108] 79  (10/30 0700) Cardiac Rhythm:  [-] Atrial fibrillation (10/30 0000) Resp:  [13-22] 17  (10/30 0700) BP: (96-138)/(41-88) 138/66 mmHg (10/30 0700) SpO2:  [93 %-100 %] 96 % (10/30 0700) Weight:  [128 kg (282 lb 3 oz)] 128 kg (282 lb 3 oz) (10/30 0500)  Hemodynamic parameters for last 24 hours:    Intake/Output from previous day: 10/29 0701 - 10/30 0700 In: 1561.9 [P.O.:600; I.V.:711.9; IV Piggyback:250] Out: 1335 [Urine:1315; Chest Tube:20] Intake/Output this shift:    General appearance: alert and cooperative Neurologic: intact Heart: irregularly irregular rhythm Lungs: clear to auscultation bilaterally Extremities: edema mild Wound: incision ok  Lab Results:  Basename 10/01/12 0500 09/30/12 1635 09/30/12 1615  WBC 13.6* -- 15.9*  HGB 10.8* 11.2* --  HCT 32.4* 33.0* --  PLT 108* -- 109*   BMET:  Basename 10/01/12 0500 09/30/12 1635 09/30/12 0410  NA 132* 137 --  K 4.1 4.6 --  CL 100 102 --  CO2 27 -- 24  GLUCOSE 157* 204* --  BUN 15 14 --  CREATININE 0.74 0.90 --  CALCIUM 8.3* -- 8.2*    PT/INR:  Basename 10/01/12 0500  LABPROT 15.8*  INR 1.29   ABG    Component Value Date/Time   PHART 7.351 09/29/2012 2102   HCO3 24.1* 09/29/2012 2102   TCO2 23 09/30/2012 1635   ACIDBASEDEF 1.0 09/29/2012 2102   O2SAT 97.0 09/29/2012 2102   CBG (last 3)   Basename 10/01/12 0732 10/01/12 0340 10/01/12 0051  GLUCAP 141* 186* 233*   CXR:  Left basilar atelectasis, mild pulmonary vascular congestion. Assessment/Plan: S/P Procedure(s) (LRB): AORTIC VALVE REPLACEMENT (AVR) (N/A) Mobilize Diuresis Diabetes control: put back on insulin drip overnight due to hyperglycemia, probably related to clear liquid (sugary) diet yesterday.  Continue Lantus bid, restart metformin. Plan for transfer to step-down: see transfer orders   LOS: 2 days    BARTLE,BRYAN K 10/01/2012

## 2012-10-01 NOTE — Progress Notes (Addendum)
Post-Op Procedure(s) (LRB): #2 AORTIC VALVE REPLACEMENT   Subjective: Feels pretty good. Sore, but "ok". Still no BM  Objective: Vital signs in last 24 hours: Temp:  [98.2 F (36.8 C)-99.7 F (37.6 C)] 98.7 F (37.1 C) (10/30 0730) Pulse Rate:  [52-108] 75  (10/30 0800) Resp:  [11-22] 11  (10/30 0800) BP: (96-138)/(41-88) 138/66 mmHg (10/30 0700) SpO2:  [93 %-100 %] 97 % (10/30 0800) Weight:  [128 kg (282 lb 3 oz)] 128 kg (282 lb 3 oz) (10/30 0500) Weight change: 8.9 kg (19 lb 9.9 oz)   Intake/Output from previous day: +3768  Wt 127 up from 119 pre-op 10/29 0701 - 10/30 0700 In: 1561.9 [P.O.:600; I.V.:711.9; IV Piggyback:250] Out: [id number] [Urine:1315; Chest Tube:20] Intake/Output this shift:    PE: General:alert and oriented,appropriate post-op discomfort Heart:S1S2 Irreg irreg - no M/R/G Lungs:CTAB, non-labored. No wheezes, but decrease BS both bases Abd:+ BS soft, non tender Ext: trace to mild edema Neuro: intact  Swan &CT removed yesterday RIJ Line & Foley to come out today  Lab Results:  Basename 10/01/12 0500 09/30/12 1635 09/30/12 1615  WBC 13.6* -- 15.9*  HGB 10.8* 11.2* --  HCT 32.4* 33.0* --  PLT 108* -- 109*   BMET  Basename 10/01/12 0500 09/30/12 1635 09/30/12 0410  NA 132* 137 --  K 4.1 4.6 --  CL 100 102 --  CO2 27 -- 24  GLUCOSE 157* 204* --  BUN 15 14 --  CREATININE 0.74 0.90 --  CALCIUM 8.3* -- 8.2*   Studies/Results: Dg Chest Portable 1 View In Am  10/01/2012  *RADIOLOGY REPORT*  Clinical Data: Status post cardiac surgery.  Weakness.  PORTABLE CHEST - 1 VIEW  Comparison: Chest x-ray 09/30/2012.  Findings: Previously noted mediastinal drains and Swan-Ganz catheter have both been removed.  Lung volumes remain low.  There is a left basilar opacity favored to represent resolving postoperative atelectasis in the left lower lobe, with superimposed small left pleural effusion.  Possible trace right pleural effusion.  Crowding of the pulmonary  vasculature, accentuated by low lung volumes.  Mild enlargement of the cardiopericardial silhouette is unchanged. The patient is rotated to the left on today's exam, resulting in distortion of the mediastinal contours and reduced diagnostic sensitivity and specificity for mediastinal pathology.  Atherosclerosis in the thoracic aorta.  Status post median sternotomy for aortic valve replacement.  No definite pneumothorax.  Right IJ Cordis remains in position with tip terminating in the internal jugular vein.  IMPRESSION: 1.  Postoperative changes and support apparatus, as above. 2.  Low lung volumes with resolving bibasilar postoperative atelectasis and bilateral pleural effusions (small on the left and trace on the right). 3.  Mild enlargement of the cardiopericardial silhouette is unchanged. 4.  Atherosclerosis.   Original Report Authenticated By: Etheleen Mayhew, M.D.    CABG and AVR: 09/29/12 OPERATIVE PROCEDURE: Median sternotomy, extracorporeal circulation,  aortic valve replacement using a 23-mm St. Jude Regent mechanical valve.   Medications: I have reviewed the patient's current medications.    [name] acetaminophen  1,000 mg Oral Q6H   Or  . acetaminophen (TYLENOL) oral liquid 160 mg/5 mL  975 mg Per Tube Q6H  . aspirin EC  81 mg Oral Daily   Or  . aspirin  324 mg Per Tube Daily  . bisacodyl  10 mg Oral Daily   Or  . bisacodyl  10 mg Rectal Daily  . coumadin book   Does not apply Once  . docusate sodium  200  mg Oral Daily  . famotidine (PEPCID) IV  20 mg Intravenous Q12H  . furosemide  40 mg Intravenous Once  . influenza  inactive virus vaccine  0.5 mL Intramuscular Tomorrow-1000  . insulin glargine  30 Units Subcutaneous BID  . insulin regular  0-10 Units Intravenous TID WC  . metFORMIN  1,000 mg Oral BID WC  . moxifloxacin  400 mg Intravenous Q24H  . pantoprazole  40 mg Oral Daily  . potassium chloride  40 mEq Oral Once  . sodium chloride  3 mL Intravenous Q12H  . warfarin  7.5  mg Oral ONCE-1800  . warfarin   Does not apply Once  . Warfarin - Physician Dosing Inpatient   Does not apply q1800   Assessment/Plan: Principal Problem:  *S/P AVR (aortic valve replacement), 09/29/12 Active Problems:  Severe aortic stenosis  Permanent atrial fibrillation, since 1994  DM (diabetes mellitus)  Sleep apnea  Asymptomatic LV dysfunction, EF 45%, normal coronary arteries on cardiac cath 09/18/12  Chronic anticoagulation, on Xarelto prior to admit  PLAN:  Post op day 2  Chronic Atrial fib continues rate controlled.  BP stable on low dose BB.  Was on Digoxin at home - may help with rate control if rate increases.  Volume overload s/p surgery - diuresis per CT Sgx.  Progressing approprietly, coumadin has been started -- can determine Xarelto vs. Coumadin as OP  Diabetes control increased due to elevated CBG  Bowel regimen  Mobilize     LOS: 2 days   HARDING,DAVID W 10/01/2012, 8:38 AM

## 2012-10-02 ENCOUNTER — Encounter (HOSPITAL_COMMUNITY): Payer: Self-pay | Admitting: Surgery

## 2012-10-02 LAB — BASIC METABOLIC PANEL
CO2: 27 mEq/L (ref 19–32)
Calcium: 8.7 mg/dL (ref 8.4–10.5)
Potassium: 3.8 mEq/L (ref 3.5–5.1)
Sodium: 136 mEq/L (ref 135–145)

## 2012-10-02 LAB — GLUCOSE, CAPILLARY
Glucose-Capillary: 229 mg/dL — ABNORMAL HIGH (ref 70–99)
Glucose-Capillary: 265 mg/dL — ABNORMAL HIGH (ref 70–99)
Glucose-Capillary: 300 mg/dL — ABNORMAL HIGH (ref 70–99)

## 2012-10-02 LAB — PROTIME-INR
INR: 1.22 (ref 0.00–1.49)
Prothrombin Time: 15.2 seconds (ref 11.6–15.2)

## 2012-10-02 LAB — CBC
MCH: 28.6 pg (ref 26.0–34.0)
Platelets: 123 10*3/uL — ABNORMAL LOW (ref 150–400)
RBC: 3.57 MIL/uL — ABNORMAL LOW (ref 4.22–5.81)
WBC: 11.9 10*3/uL — ABNORMAL HIGH (ref 4.0–10.5)

## 2012-10-02 MED ORDER — WARFARIN SODIUM 10 MG PO TABS
10.0000 mg | ORAL_TABLET | Freq: Once | ORAL | Status: AC
  Administered 2012-10-02: 10 mg via ORAL
  Filled 2012-10-02: qty 1

## 2012-10-02 MED ORDER — INSULIN ASPART 100 UNIT/ML ~~LOC~~ SOLN
6.0000 [IU] | Freq: Three times a day (TID) | SUBCUTANEOUS | Status: DC
  Administered 2012-10-03 – 2012-10-04 (×4): 6 [IU] via SUBCUTANEOUS

## 2012-10-02 MED ORDER — METFORMIN HCL 500 MG PO TABS
1000.0000 mg | ORAL_TABLET | Freq: Two times a day (BID) | ORAL | Status: DC
  Administered 2012-10-03 – 2012-10-04 (×3): 1000 mg via ORAL
  Filled 2012-10-02 (×5): qty 2

## 2012-10-02 MED ORDER — METFORMIN HCL 500 MG PO TABS
2000.0000 mg | ORAL_TABLET | Freq: Every day | ORAL | Status: DC
  Filled 2012-10-02: qty 4

## 2012-10-02 MED ORDER — GLYBURIDE 5 MG PO TABS
5.0000 mg | ORAL_TABLET | Freq: Every day | ORAL | Status: DC
  Administered 2012-10-03 – 2012-10-04 (×2): 5 mg via ORAL
  Filled 2012-10-02 (×3): qty 1

## 2012-10-02 MED ORDER — INSULIN GLARGINE 100 UNIT/ML ~~LOC~~ SOLN
40.0000 [IU] | Freq: Two times a day (BID) | SUBCUTANEOUS | Status: DC
  Administered 2012-10-02 – 2012-10-04 (×4): 40 [IU] via SUBCUTANEOUS

## 2012-10-02 NOTE — Progress Notes (Signed)
Inpatient Diabetes Program Recommendations  AACE/ADA: New Consensus Statement on Inpatient Glycemic Control (2013)  Target Ranges:  Prepandial:   less than 140 mg/dL      Peak postprandial:   less than 180 mg/dL (1-2 hours)      Critically ill patients:  140 - 180 mg/dL  Results for KADAR, MERKERSON (MRN [id number]) as of 10/02/2012 10:14  Ref. Range 10/01/2012 20:09 10/02/2012 00:23 10/02/2012 04:16 10/02/2012 05:50 10/02/2012 08:07  Glucose-Capillary Latest Range: 70-99 mg/dL 263 (H) 300 (H) 260 (H)  229 (H)   Inpatient Diabetes Program Recommendations Insulin - Basal: Increase Lantus to 35 units BID Correction (SSI): change to TID ac + HS RESISTANT scale per Glycemic Control Order-set Thank you  Raoul Pitch RN,BSN,CDE Inpatient Diabetes Coordinator [phone] (team pager)

## 2012-10-02 NOTE — Progress Notes (Signed)
Ventricle pacing wire removed. Ends intact. Pt reminded to lay supine for 1 hour. BP 124/54. Will continue to monitor pt.

## 2012-10-02 NOTE — Progress Notes (Signed)
Anesthesiology Follow-Up:  Alert, neuro intact, feels well today, had episode of diaphoresis yesterday.  VS: T-37 BP 134/80 RR 20  HR 85 (Afib) O2 Sat 95% on RA  K-3.8 glucose 232  BUN/Cr.: 18/0.88 H/H: 10.2/30.6 Plts: 123,000  Extubated 5 hours post-op.   61 Y/O WM with aortic stenosis and chronic Afib, underwent AVR 10/28. Stable post-op course so far.  Roberts Gaudy, MD

## 2012-10-02 NOTE — Progress Notes (Signed)
CPAP machine at bedside, helped place on pt. Wore previous night and tol well. May have mask brought in from home. RT will assist as needed.

## 2012-10-02 NOTE — Progress Notes (Signed)
CARDIAC REHAB PHASE I   PRE:  Rate/Rhythm: 95 afib    BP: sitting 120/66    SaO2: 96 RA  MODE:  Ambulation: 500 ft   POST:  Rate/Rhythm: 90 afib    BP: sitting 144/66     SaO2: 97 RA  Tolerated well with RW. Has RW at home. No c/o. Return to recliner. [id number]  Darrick Meigs CES, ACSM

## 2012-10-02 NOTE — Progress Notes (Signed)
3 Days Post-Op Procedure(s) (LRB):  AORTIC VALVE REPLACEMENT   Subjective: Yesterday after transfer to [facility] he became significantly diaphoretic, no pain, no sob, glucose was stable.  VS stable, he was concerned about these symptoms.  Never that diaphoretic before.   Objective: Vital signs in last 24 hours: Temp:  [98.2 F (36.8 C)-98.9 F (37.2 C)] 98.6 F (37 C) (10/31 0507) Pulse Rate:  [75-88] 85  (10/31 0507) Resp:  [19-20] 20  (10/31 0507) BP: (127-162)/(51-80) 134/80 mmHg (10/31 0507) SpO2:  [93 %-97 %] 93 % (10/31 0507) Weight:  [129.2 kg (284 lb 13.4 oz)] 129.2 kg (284 lb 13.4 oz) (10/31 0507) Weight change: 1.2 kg (2 lb 10.3 oz) Last BM Date: 10/01/12 Intake/Output from previous day: -416 10/30 0701 - 10/31 0700 In: 544 [P.O.:480; I.V.:60; IV Piggyback:4] Out: 910 [Urine:910] Intake/Output this shift:    PE: General:alert and oriented, pleasant affect, just completed walk in hall Heart:S1S2 Irreg Irreg, + closure of valve Lungs:mildly diminished in bases, otherwise clear Abd:+ BS, soft, non tender Ext:tr edema   Lab Results:  Basename 10/02/12 0550 10/01/12 0500  WBC 11.9* 13.6*  HGB 10.2* 10.8*  HCT 30.6* 32.4*  PLT 123* 108*   BMET  Basename 10/02/12 0550 10/01/12 0500  NA 136 132*  K 3.8 4.1  CL 100 100  CO2 27 27  GLUCOSE 231* 157*  BUN 18 15  CREATININE 0.88 0.74  CALCIUM 8.7 8.3*    EKG: Orders placed during the hospital encounter of 09/29/12  . EKG 12-LEAD  . EKG 12-LEAD    Studies/Results: Dg Chest Portable 1 View In Am  10/01/2012  *RADIOLOGY REPORT*  Clinical Data: Status post cardiac surgery.  Weakness.  PORTABLE CHEST - 1 VIEW  Comparison: Chest x-ray 09/30/2012.  Findings: Previously noted mediastinal drains and Swan-Ganz catheter have both been removed.  Lung volumes remain low.  There is a left basilar opacity favored to represent resolving postoperative atelectasis in the left lower lobe, with superimposed small left pleural  effusion.  Possible trace right pleural effusion.  Crowding of the pulmonary vasculature, accentuated by low lung volumes.  Mild enlargement of the cardiopericardial silhouette is unchanged. The patient is rotated to the left on today's exam, resulting in distortion of the mediastinal contours and reduced diagnostic sensitivity and specificity for mediastinal pathology.  Atherosclerosis in the thoracic aorta.  Status post median sternotomy for aortic valve replacement.  No definite pneumothorax.  Right IJ Cordis remains in position with tip terminating in the internal jugular vein.  IMPRESSION: 1.  Postoperative changes and support apparatus, as above. 2.  Low lung volumes with resolving bibasilar postoperative atelectasis and bilateral pleural effusions (small on the left and trace on the right). 3.  Mild enlargement of the cardiopericardial silhouette is unchanged. 4.  Atherosclerosis.   Original Report Authenticated By: Etheleen Mayhew, M.D.     Medications: I have reviewed the patient's current medications.    . docusate sodium  200 mg Oral Daily  . escitalopram  10 mg Oral QPM  . furosemide  40 mg Oral BID  . glyBURIDE  5 mg Oral Q breakfast  . insulin aspart  0-24 Units Subcutaneous Q4H  . insulin aspart  0-24 Units Subcutaneous Q2H   Followed by  . insulin aspart  0-24 Units Subcutaneous Q4H  . insulin glargine  30 Units Subcutaneous BID  . metFORMIN  2,000 mg Oral QHS  . metoprolol tartrate  25 mg Oral BID  . moving right along book  Does not apply Once  . pantoprazole  40 mg Oral QAC breakfast  . potassium chloride  40 mEq Oral Daily  . sodium chloride  3 mL Intravenous Q12H  . warfarin  7.5 mg Oral ONCE-1800  . Warfarin - Physician Dosing Inpatient   Does not apply q1800  . DISCONTD: acetaminophen (TYLENOL) oral liquid 160 mg/5 mL  975 mg Per Tube Q6H  . DISCONTD: acetaminophen  1,000 mg Oral Q6H  . DISCONTD: aspirin  324 mg Per Tube Daily  . DISCONTD: aspirin EC  81 mg Oral  Daily  . DISCONTD: bisacodyl  10 mg Oral Daily  . DISCONTD: bisacodyl  10 mg Rectal Daily  . DISCONTD: docusate sodium  200 mg Oral Daily  . DISCONTD: influenza  inactive virus vaccine  0.5 mL Intramuscular Tomorrow-1000  . DISCONTD: insulin regular  0-10 Units Intravenous TID WC  . DISCONTD: metFORMIN  1,000 mg Oral BID WC  . DISCONTD: pantoprazole  40 mg Oral Daily  . DISCONTD: sodium chloride  3 mL Intravenous Q12H  . DISCONTD: warfarin   Does not apply Once   Assessment/Plan: Principal Problem:  *S/P AVR (aortic valve replacement), 09/29/12, St. Jude mechanical aortic valve Active Problems:  Severe aortic stenosis  Permanent atrial fibrillation, since 1994  DM (diabetes mellitus)  Sleep apnea  Asymptomatic LV dysfunction, EF 45%, normal coronary arteries on cardiac cath 09/18/12  Chronic anticoagulation, on Xarelto prior to admit  PLAN: chronic a. Fib rate controlled.  On coumadin  INR 1.22, mild anemia post op with thrombocytopenia  Yesterday with episode of diaphoresis, after transfer to 2000, ? Due to increased activity?  LOS: 3 days   INGOLD,LAURA R 10/02/2012, 10:59 AM   Patient seen and examined. Agree with assessment and plan. Feels well today. Had an episode of diaphoresis yesterday. Getting coumadinized; was on coumadin chronically and only used xarelto post cath pending AVR surgery. Cardiac Rehab. Arrange f/u ov with me once DC.   Troy Sine, MD, [facility] 10/02/2012 12:19 PM

## 2012-10-02 NOTE — Addendum Note (Signed)
Addendum  created 10/02/12 1452 by Roberts Gaudy, MD   Modules edited:Notes Section

## 2012-10-02 NOTE — Progress Notes (Addendum)
3 Days Post-Op Procedure(s) (LRB): AORTIC VALVE REPLACEMENT (AVR) (N/A) Subjective:  Mr. Omo has no complaints this morning.  He was on CPAP during evaluation. Needs to ambulate, no BM Objective: Vital signs in last 24 hours: Temp:  [98.2 F (36.8 C)-98.9 F (37.2 C)] 98.6 F (37 C) (10/31 0507) Pulse Rate:  [75-88] 85  (10/31 0507) Cardiac Rhythm:  [-] Atrial fibrillation (10/30 2049) Resp:  [11-23] 20  (10/31 0507) BP: (127-162)/(51-80) 134/80 mmHg (10/31 0507) SpO2:  [93 %-97 %] 93 % (10/31 0507) Weight:  [284 lb 13.4 oz (129.2 kg)] 284 lb 13.4 oz (129.2 kg) (10/31 0507)   Intake/Output from previous day: 10/30 0701 - 10/31 0700 In: 544 [P.O.:480; I.V.:60; IV Piggyback:4] Out: 910 [Urine:910]  General appearance: alert, cooperative and no distress Heart: irregularly irregular rhythm Lungs: clear to auscultation bilaterally Abdomen: soft, non-tender; bowel sounds normal; no masses,  no organomegaly Extremities: edema trace Wound: clean and dry  Lab Results:  Basename 10/02/12 0550 10/01/12 0500  WBC 11.9* 13.6*  HGB 10.2* 10.8*  HCT 30.6* 32.4*  PLT 123* 108*   BMET:  Basename 10/02/12 0550 10/01/12 0500  NA 136 132*  K 3.8 4.1  CL 100 100  CO2 27 27  GLUCOSE 231* 157*  BUN 18 15  CREATININE 0.88 0.74  CALCIUM 8.7 8.3*    PT/INR:  Basename 10/02/12 0550  LABPROT 15.2  INR 1.22   ABG    Component Value Date/Time   PHART 7.351 09/29/2012 2102   HCO3 24.1* 09/29/2012 2102   TCO2 23 09/30/2012 1635   ACIDBASEDEF 1.0 09/29/2012 2102   O2SAT 97.0 09/29/2012 2102   CBG (last 3)   Basename 10/02/12 0416 10/02/12 0023 10/01/12 2009  GLUCAP 260* 300* 263*    Assessment/Plan: S/P Procedure(s) (LRB): AORTIC VALVE REPLACEMENT (AVR) (N/A)  1. CV- Atrial Fibrillation, chronic, rate controlled in the 70s- continue Lopressor 2. INR 1.22- continue Coumadin 7.5mg  daily 3. Pulm- CPAP nightly, + Atelectasis continue IS 4. DM- CBGs uncontrolled running mid  200s to 300, will increase patient's Metformin to home dose and restart Glyburide, patient on Lantus 30mg  BID, may benefit from scheduled meal time coverage 5. Volume Overload- patients weight is up 10 lbs, continue Lasix 6. Dispo- patient making slow progress, will d/c EPW since on Coumadin  LOS: 3 days    BARRETT, ERIN 10/02/2012   His glucose is still not well-controlled. His HgbA1c was 7.9 preop. I will increase Lantus to 40 bid and add Novolog meal coverage. He should take his Metformin bid. Coumadin 10 mg ordered today.

## 2012-10-03 LAB — GLUCOSE, CAPILLARY
Glucose-Capillary: 174 mg/dL — ABNORMAL HIGH (ref 70–99)
Glucose-Capillary: 236 mg/dL — ABNORMAL HIGH (ref 70–99)
Glucose-Capillary: 210 mg/dL — ABNORMAL HIGH (ref 70–99)
Glucose-Capillary: 274 mg/dL — ABNORMAL HIGH (ref 70–99)

## 2012-10-03 LAB — PROTIME-INR
INR: 1.14 (ref 0.00–1.49)
Prothrombin Time: 14.4 seconds (ref 11.6–15.2)

## 2012-10-03 MED ORDER — POTASSIUM CHLORIDE CRYS ER 20 MEQ PO TBCR: 40.0000 meq | EXTENDED_RELEASE_TABLET | Freq: Every day | ORAL | Status: DC

## 2012-10-03 MED ORDER — INSULIN ASPART 100 UNIT/ML ~~LOC~~ SOLN: 0.0000 [IU] | SUBCUTANEOUS | Status: DC

## 2012-10-03 MED ORDER — WARFARIN SODIUM 7.5 MG PO TABS
15.0000 mg | ORAL_TABLET | Freq: Once | ORAL | Status: AC
  Administered 2012-10-03: 15 mg via ORAL
  Filled 2012-10-03: qty 2

## 2012-10-03 MED ORDER — OXYCODONE HCL 5 MG PO TABS: 5.0000 mg | ORAL_TABLET | ORAL | Status: DC | PRN

## 2012-10-03 MED ORDER — METOPROLOL TARTRATE 25 MG PO TABS: 25.0000 mg | ORAL_TABLET | Freq: Two times a day (BID) | ORAL | Status: DC

## 2012-10-03 MED ORDER — FUROSEMIDE 40 MG PO TABS: 40.0000 mg | ORAL_TABLET | Freq: Every day | ORAL | Status: DC

## 2012-10-03 MED ORDER — INSULIN ASPART 100 UNIT/ML ~~LOC~~ SOLN
0.0000 [IU] | SUBCUTANEOUS | Status: DC
  Administered 2012-10-03: 8 [IU] via SUBCUTANEOUS
  Administered 2012-10-03: 12 [IU] via SUBCUTANEOUS
  Administered 2012-10-03 – 2012-10-04 (×2): 8 [IU] via SUBCUTANEOUS
  Administered 2012-10-04 (×2): 2 [IU] via SUBCUTANEOUS

## 2012-10-03 MED ORDER — INFLUENZA VIRUS VACC SPLIT PF IM SUSP
0.5000 mL | INTRAMUSCULAR | Status: AC
  Administered 2012-10-04: 0.5 mL via INTRAMUSCULAR
  Filled 2012-10-03: qty 0.5

## 2012-10-03 NOTE — Progress Notes (Signed)
Pt called and said he felt like BS was low; pt diaphoretic, clammy; pt given orange juice; BS checked after orange juice given; BS 237; pt states symptoms resolved; will cont. To monitor.

## 2012-10-03 NOTE — Discharge Summary (Signed)
Physician Discharge Summary  Patient ID: Joseph Hoffman MRN: [id number] DOB/AGE: [birth year] 61 y.o.  Admit date: 09/29/2012 Discharge date: 10/03/2012  Admission Diagnoses:  Patient Active Problem List  Diagnosis  . Severe aortic stenosis  . Permanent atrial fibrillation, since 1994  . DM (diabetes mellitus)  . Sleep apnea  . Asymptomatic LV dysfunction, EF 45%, normal coronary arteries on cardiac cath 09/18/12  . Chronic anticoagulation, on Xarelto prior to admit   Discharge Diagnoses:   Patient Active Problem List  Diagnosis  . Severe aortic stenosis  . S/P AVR (aortic valve replacement), 09/29/12, St. Jude mechanical aortic valve  . Permanent atrial fibrillation, since 1994  . DM (diabetes mellitus)  . Sleep apnea  . Asymptomatic LV dysfunction, EF 45%, normal coronary arteries on cardiac cath 09/18/12  . Chronic anticoagulation, on Xarelto prior to admit   Discharged Condition: good  History of Present Illness:   [name] is a 61 yo morbidly obese white male with known history of Chronic Atrial Fibrillation treated with Coumadin and Digoxin.  He presented to his PCP with a complaint of chest discomfort.  EKG obtained was abnormal resulting in a referral for Cardiology evaluation in March of 2013.  He was also noted to have a heart murmur on exam that the patient stated he did not know about.  He underwent Echocardiogram which showed moderately severe aortic stenosis with a mean gradient of 53 mmHg and a peak gradient of 85.  His EF was reduced at 45-50% with severe dilatation of the right and left atrium.  He underwent repeat Echocardiogram on 08/22/2012 which revealed a calcified aortic valve with progression of his aortic stenosis with a mean gradient of 69 and a peak gradient of 104.  The patient had been feeling fine until approximately one week prior at which time he noticed some chest tightness with radiation into his neck.  Cardiac catheterization did not reveal any  significant coronary disease.  Due to his disease progression he was referred to [facility] for surgical evaluation.  He was evaluated by [name] on 09/18/2012 at which time it was felt the patient would benefit from Aortic Valve Replacement.  It was felt that due to the chronic nature of his Atrial Fibrillation that a MAZE procedure would most likely not convert the patient to Normal Sinus Rhythm and would therefore not be performed.  The patient was agreeable to the risks and benefits of the procedure and surgery was scheduled for 09/29/2012.  Hospital Course:   [name] presented to [facility] on 09/29/2012.  He was taken to the operating room and underwent Aortic Valve Replacement utilizing a 23 mm St. Jude Regent Mechanical Valve Prosthesis.  The patient tolerated the procedure well and was taken to the SICU in stable condition.  POD #0 the patient was extubated.  POD #1 he was restarted on Coumadin.  His arterial lines and chest tubes were removed without difficulty.  He was weaned off his insulin drip and Neo synephrine as tolerated.  POD #2 patient with hyperglycemia overnight requiring resumption of insulin drip.  He was medically stable and transferred to the step down unit.  POD #3 patients blood sugars were uncontrolled.  His insulin regimen was adjusted and meal time coverage was added.  He remained in chronic atrial fibrillation and his external pacing wires were removed without difficulty.  POD #4 Patient is doing well.  His blood sugars have improved with increase in insulin.  His INR remains sub therapeutic.  He will be placed back on his home regimen at discharge.  He is medically stable at this time.  Should he continue to progress we will plan for discharge home in the next 24-48 hours.  He will need to have his PT/INR drawn on Monday Morning.  He will also need to follow up with his PCP regarding diabetes management.  He will follow up with [name] on October 28, 2012 at 1:30pm  with a chest xray prior to his visit.  He will also need to follow up with Dr. Georgina Peer on October 21, 2012 at 11:45  Significant Diagnostic Studies:   RA: 20 (Corrected due to re zero) 10  RV: 50/13; corrected 40/10  PA: 50/29; corrected 40/19  PC: 29 ; Corrected 19  Pullback:  LV: 188/14  AO: 123/60  Peak to peak gradient: 65 mm Hg; mean gradient 50 mm Hg  CO: Thermo 6.9; Fick 6.2 l/m  CI: 2.9; 2.6l/m/m2  AVA: 1.0cm2 (Thermo); 0.9 cm2(Fick)  EF 45%  Calcified AV with reduced excursion.  No sig AR  No significant CAD  Treatments: surgery:   Median sternotomy, extracorporeal circulation,  aortic valve replacement using a 23-mm St. Jude Regent mechanical valve  The patient has been discharged on:   1.Beta Blocker:  Yes [ x  ]                              No   [   ]                              If No, reason:  2.Ace Inhibitor/ARB: Yes [   ]                                     No  [ x   ]                                     If No, reason:Labile blood pressure, no CAD  3.Statin:   Yes [x   ]                  No  [   ]                  If No, reason  4.Shela Commons:  Yes  [   ]                  No   [ x  ]                  If No, reason: On Coumadin (high dose) no CAD     Disposition: 01-Home or Self Care  Discharge Orders    Future Appointments: Provider: Department: Dept Phone: Center:   10/28/2012 1:30 PM Gaye Pollack, MD [facility] [phone] [facility]     Future Orders Please Complete By Expires   Amb Referral to Cardiac Rehabilitation          Medication List     As of 10/03/2012  1:24 PM    STOP taking these medications         amLODipine-valsartan 5-320 MG per tablet   Commonly known as: EXFORGE  digoxin 0.25 MG tablet   Commonly known as: LANOXIN      XARELTO 20 MG Tabs   Generic drug: Rivaroxaban      TAKE these medications         albuterol 108 (90 BASE) MCG/ACT inhaler   Commonly known as: PROVENTIL HFA;VENTOLIN HFA   Inhale 2  puffs into the lungs every 6 (six) hours as needed. For shortness of breath      CENTRUM SPECIALIST HEART PO   Take 1 tablet by mouth 2 (two) times daily.      cholecalciferol 1000 UNITS tablet   Commonly known as: VITAMIN D   Take 1,000 Units by mouth daily.      Chromium 200 MCG Caps   Take 200 mcg by mouth daily.      escitalopram 10 MG tablet   Commonly known as: LEXAPRO   Take 10 mg by mouth every evening.      Fluticasone-Salmeterol 250-50 MCG/DOSE Aepb   Commonly known as: ADVAIR   Inhale 1 puff into the lungs 2 (two) times daily as needed. For asthma related symptoms      furosemide 40 MG tablet   Commonly known as: LASIX   Take 1 tablet (40 mg total) by mouth daily. FOr 5 days      GLUCOSAMINE 1500 COMPLEX PO   Take 1,500 mg by mouth daily.      glyBURIDE 5 MG tablet   Commonly known as: DIABETA   Take 5 mg by mouth daily with breakfast.      insulin glargine 100 UNIT/ML injection   Commonly known as: LANTUS   Inject 5-20 Units into the skin 2 (two) times daily. 5-20 units at breakfast and dinner depending on blood sugar levels      INVOKANA 300 MG Tabs   Generic drug: Canagliflozin   Take 1 tablet by mouth every morning.      metFORMIN 500 MG 24 hr tablet   Commonly known as: GLUCOPHAGE-XR   Take 2,000 mg by mouth at bedtime.      metoprolol tartrate 25 MG tablet   Commonly known as: LOPRESSOR   Take 1 tablet (25 mg total) by mouth 2 (two) times daily.      niacin-simvastatin 500-20 MG 24 hr tablet   Commonly known as: SIMCOR   Take 1 tablet by mouth at bedtime.      oxyCODONE 5 MG immediate release tablet   Commonly known as: Oxy IR/ROXICODONE   Take 1 tablet (5 mg total) by mouth every 4 (four) hours as needed.      potassium chloride SA 20 MEQ tablet   Commonly known as: K-DUR,KLOR-CON   Take 2 tablets (40 mEq total) by mouth daily. FOr 5 days      VICTOZA 18 MG/3ML Soln   Generic drug: Liraglutide   Inject 18 mg into the skin daily with  breakfast.      vitamin C 1000 MG tablet   Take 1,000 mg by mouth daily.      warfarin 10 MG tablet   Commonly known as: COUMADIN   Take 10-15 mg by mouth every evening. 10 mg mon thru sat and 15 mg on sun           Follow-up Information    Follow up with Troy Sine, MD. On 10/21/2012. (11:45am)    Contact information:   [address] [place] [address] [phone]       Follow up with Gaye Pollack,  MD. On 10/28/2012. (Appointment at 130pm)    Contact information:   [place] [address] [phone]       Follow up with [facility]. On 10/28/2012. (Please get chest xray at 1230)    Contact information:   [place] [id number]       Follow up with PT/INR Check. (Please get PT/INR check on Monday at Dr. Lucy Chris office)          Signed: Ellwood Handler 10/03/2012, 1:24 PM

## 2012-10-03 NOTE — Progress Notes (Signed)
CARDIAC REHAB PHASE I   PRE:  Rate/Rhythm: 88 Afib  BP:  Supine:   Sitting: 112/70  Standing:    SaO2: 99 RA  MODE:  Ambulation: 550 ft   POST:  Rate/Rhythem: 93  BP:  Supine:   Sitting: 118/74  Standing:    SaO2: 98 RA 1310-1355 Assisted X 1 and used walker to ambulate. Gait steady with walker. VS stable Pt without c/o with walking. Pt back to recliner after walk with call light in reach. Completed discharge education with pt. He agrees to [facility]. CRP in [place], will send referral.  Deon Pilling

## 2012-10-03 NOTE — Progress Notes (Signed)
I briefly saw the patient this AM  He is feeling well.  Soreness is minimal, breathing fine, no edema.  BP 130/70  Pulse 89  Temp 98.7 F (37.1 C) (Oral)  Resp 18  Ht 5\' 8"  (1.727 m)  Wt 127.2 kg (280 lb 6.8 oz)  BMI 42.64 kg/m2  SpO2 99% General appearance: alert, cooperative, appears stated age and no distress Neck: no carotid bruit, no JVD and supple, symmetrical, trachea midline Lungs: clear to auscultation bilaterally and normal percussion bilaterally Heart: regular rate and rhythm, S1: normal, S2: crisp mechanical valve sounds and no R/G Abdomen: soft, non-tender; bowel sounds normal; no masses,  no organomegaly Extremities: extremities normal, atraumatic, no cyanosis or edema Neurologic: Alert and oriented X 3, normal strength and tone. Normal symmetric reflexes. Normal coordination and gait  No new labs.  Principal Problem:  *S/P AVR (aortic valve replacement), 09/29/12, St. Jude mechanical aortic valve Active Problems:  Severe aortic stenosis  Permanent atrial fibrillation, since 1994  DM (diabetes mellitus)  Sleep apnea  Asymptomatic LV dysfunction, EF 45%, normal coronary arteries on cardiac cath 09/18/12  Chronic anticoagulation, on Xarelto prior to admit   Progressing well.   Stable from a cardiac standpoint --BP & HR stable, no arrythmia. CT Xgx service is adjusting DM Rx   Anticipate d/c Sat or Sun.   Will be available for assistance if needed.  ROV scheduled with Dr. Claiborne Billings.  Leonie Man, M.D., M.S. [facility] [address] [place], [place]  [address]  [phone] Pager # [phone] 10/03/2012 11:52 AM

## 2012-10-03 NOTE — Progress Notes (Addendum)
4 Days Post-Op Procedure(s) (LRB): AORTIC VALVE REPLACEMENT (AVR) (N/A) Subjective:  [name] is a very pleasant gentleman.  He has no complaints this morning.  He is ambulating without difficulty.  His INR is subtherapeutic, and the patient states that he usually takes 10mg  M-Sat and 15mg  on Sunday +BM  Objective: Vital signs in last 24 hours: Temp:  [97.6 F (36.4 C)-99.2 F (37.3 C)] 98.7 F (37.1 C) (11/01 0605) Pulse Rate:  [74-99] 74  (11/01 0605) Cardiac Rhythm:  [-] Atrial fibrillation (11/01 0759) Resp:  [18-20] 18  (11/01 0605) BP: (124-151)/(45-68) 148/68 mmHg (11/01 0605) SpO2:  [95 %-99 %] 99 % (11/01 0605) Weight:  [280 lb 6.8 oz (127.2 kg)] 280 lb 6.8 oz (127.2 kg) (11/01 0704)    Intake/Output from previous day: 10/31 0701 - 11/01 0700 In: 480 [P.O.:480] Out: -   General appearance: alert, cooperative and no distress Heart: irregularly irregular rhythm Lungs: clear to auscultation bilaterally Abdomen: soft, non-tender; bowel sounds normal; no masses,  no organomegaly Extremities: edema trace Wound: clean and dry  Lab Results:  Basename 10/02/12 0550 10/01/12 0500  WBC 11.9* 13.6*  HGB 10.2* 10.8*  HCT 30.6* 32.4*  PLT 123* 108*   BMET:  Basename 10/02/12 0550 10/01/12 0500  NA 136 132*  K 3.8 4.1  CL 100 100  CO2 27 27  GLUCOSE 231* 157*  BUN 18 15  CREATININE 0.88 0.74  CALCIUM 8.7 8.3*    PT/INR:  Basename 10/03/12 0445  LABPROT 14.4  INR 1.14   ABG    Component Value Date/Time   PHART 7.351 09/29/2012 2102   HCO3 24.1* 09/29/2012 2102   TCO2 23 09/30/2012 1635   ACIDBASEDEF 1.0 09/29/2012 2102   O2SAT 97.0 09/29/2012 2102   CBG (last 3)   Basename 10/03/12 0357 10/03/12 0014 10/02/12 2010  GLUCAP 174* 214* 332*    Assessment/Plan: S/P Procedure(s) (LRB): AORTIC VALVE REPLACEMENT (AVR) (N/A)  1. CV- Chronic Atrial Fibrillation, rate controlled-continue Lopressor 2. INR 1.14- will restart patient's home regimen, will give  10mg  tonight 3. Pulm- CPAP nightly, continue IS 4. DM- CBGS still remain uncontrolled, insulin and medications adjusted yesterday, will continue to follow closely and make further adjustments as needed 5. Volume Overload- continue lasix 6. Dispo- patient doing well from surgery standpoint, however his CBGs remain uncontrolled and we will continue to titrate his insulin as needed, the patient is hopeful to be discharged home tomorrow    LOS: 4 days    Joseph Hoffman 10/03/2012    Chart reviewed, patient examined, agree with above. INR still has not bumped so will give 15 mg tonight. If he is doing well in the am I think he can go home on previous dose of coumadin with INR followup early next week.

## 2012-10-04 LAB — GLUCOSE, CAPILLARY
Glucose-Capillary: 127 mg/dL — ABNORMAL HIGH (ref 70–99)
Glucose-Capillary: 209 mg/dL — ABNORMAL HIGH (ref 70–99)

## 2012-10-04 LAB — PROTIME-INR: INR: 1.31 (ref 0.00–1.49)

## 2012-10-04 NOTE — Progress Notes (Signed)
Pt/family given discharge instructions, medication lists, follow up appointments, and when to call the doctor.  Pt/family verbalizes understanding. Pt given signs and symptoms of infection. Joseph Hoffman    

## 2012-10-04 NOTE — Progress Notes (Signed)
                    [address]            [place] [address]          [phone]     5 Days Post-Op Procedure(s) (LRB): AORTIC VALVE REPLACEMENT (AVR) (N/A)  Subjective: Feels well, wants to go home.   Objective: Vital signs in last 24 hours: Patient Vitals for the past 24 hrs:  BP Temp Temp src Pulse Resp SpO2 Weight  10/04/12 0642 - - - - - - 281 lb 14.4 oz (127.869 kg)  10/04/12 0431 124/60 mmHg 98.8 F (37.1 C) Oral 87  19  97 % -  10/03/12 2218 - - - 84  18  - -  10/03/12 2007 144/65 mmHg 98.7 F (37.1 C) Oral 84  18  96 % -  10/03/12 1330 112/70 mmHg 98.7 F (37.1 C) Oral 88  18  99 % -  10/03/12 1015 130/70 mmHg - - - - - -  10/03/12 0955 133/72 mmHg - - 89  - - -   Current Weight  10/04/12 281 lb 14.4 oz (127.869 kg)     Intake/Output from previous day: 11/01 0701 - 11/02 0700 In: 480 [P.O.:480] Out: -   CBGs 153-127-209  PHYSICAL EXAM:  Heart: Irr irr, good valve click Lungs: Clear Wound: Clean and dry Extremities: Mild LE edema    Lab Results: CBC: Basename 10/02/12 0550  WBC 11.9*  HGB 10.2*  HCT 30.6*  PLT 123*   BMET:  Basename 10/02/12 0550  NA 136  K 3.8  CL 100  CO2 27  GLUCOSE 231*  BUN 18  CREATININE 0.88  CALCIUM 8.7    PT/INR:  Basename 10/04/12 0555  LABPROT 16.0*  INR 1.31      Assessment/Plan: S/P Procedure(s) (LRB): AORTIC VALVE REPLACEMENT (AVR) (N/A) CV- chronic AF, rate controlled. Continue Lopressor. INR trending up.  Continue regular home doses. DM- CBGs better controlled.   Vol overload- diurese. Home today- instructions reviewed with patient.   LOS: 5 days    Madia Carvell H 10/04/2012

## 2012-10-04 NOTE — Progress Notes (Signed)
Removed two CT sutures and applied benzoin and 1/2" steri's.  Pt tolerated the procedure well. Pt given signs and symptoms of infection. Will continue to monitor. Payton Emerald

## 2012-10-06 LAB — GLUCOSE, CAPILLARY: Glucose-Capillary: 171 mg/dL — ABNORMAL HIGH (ref 70–99)

## 2012-10-08 ENCOUNTER — Telehealth: Payer: Self-pay

## 2012-10-08 MED FILL — Sodium Bicarbonate IV Soln 8.4%: INTRAVENOUS | Qty: 50 | Status: AC

## 2012-10-08 MED FILL — Electrolyte-R (PH 7.4) Solution: INTRAVENOUS | Qty: 5000 | Status: AC

## 2012-10-08 MED FILL — Mannitol IV Soln 20%: INTRAVENOUS | Qty: 500 | Status: AC

## 2012-10-08 MED FILL — Heparin Sodium (Porcine) Inj 1000 Unit/ML: INTRAMUSCULAR | Qty: 20 | Status: AC

## 2012-10-08 MED FILL — Lidocaine HCl IV Inj 20 MG/ML: INTRAVENOUS | Qty: 10 | Status: AC

## 2012-10-08 MED FILL — Sodium Chloride IV Soln 0.9%: INTRAVENOUS | Qty: 1000 | Status: AC

## 2012-10-08 MED FILL — Sodium Chloride Irrigation Soln 0.9%: Qty: 3000 | Status: AC

## 2012-10-08 MED FILL — Heparin Sodium (Porcine) Inj 1000 Unit/ML: INTRAMUSCULAR | Qty: 30 | Status: AC

## 2012-10-08 NOTE — Telephone Encounter (Signed)
Pt is having no problems with his incision sites. Dr Claiborne Billings is pt's cardiologist and he will be managing his medications. I advised Mr Joseph Hoffman to call Dr Evette Georges office for any appt today.

## 2012-10-12 ENCOUNTER — Inpatient Hospital Stay (HOSPITAL_COMMUNITY)
Admission: EM | Admit: 2012-10-12 | Discharge: 2012-10-16 | DRG: 292 | Disposition: A | Discharge status: Home / Self Care | Payer: 59 | Attending: Cardiology | Admitting: Cardiology

## 2012-10-12 ENCOUNTER — Emergency Department (HOSPITAL_COMMUNITY): Payer: 59

## 2012-10-12 ENCOUNTER — Encounter (HOSPITAL_COMMUNITY): Payer: Self-pay | Admitting: Emergency Medicine

## 2012-10-12 ENCOUNTER — Other Ambulatory Visit: Payer: Self-pay

## 2012-10-12 DIAGNOSIS — Z952 Presence of prosthetic heart valve: Secondary | ICD-10-CM

## 2012-10-12 DIAGNOSIS — I428 Other cardiomyopathies: Secondary | ICD-10-CM | POA: Diagnosis present

## 2012-10-12 DIAGNOSIS — E669 Obesity, unspecified: Secondary | ICD-10-CM | POA: Diagnosis present

## 2012-10-12 DIAGNOSIS — I350 Nonrheumatic aortic (valve) stenosis: Secondary | ICD-10-CM | POA: Diagnosis present

## 2012-10-12 DIAGNOSIS — G4733 Obstructive sleep apnea (adult) (pediatric): Secondary | ICD-10-CM | POA: Diagnosis present

## 2012-10-12 DIAGNOSIS — Z6841 Body Mass Index (BMI) 40.0 and over, adult: Secondary | ICD-10-CM

## 2012-10-12 DIAGNOSIS — L270 Generalized skin eruption due to drugs and medicaments taken internally: Secondary | ICD-10-CM | POA: Diagnosis present

## 2012-10-12 DIAGNOSIS — Z794 Long term (current) use of insulin: Secondary | ICD-10-CM

## 2012-10-12 DIAGNOSIS — Z9089 Acquired absence of other organs: Secondary | ICD-10-CM

## 2012-10-12 DIAGNOSIS — R079 Chest pain, unspecified: Secondary | ICD-10-CM

## 2012-10-12 DIAGNOSIS — Z888 Allergy status to other drugs, medicaments and biological substances status: Secondary | ICD-10-CM

## 2012-10-12 DIAGNOSIS — I5023 Acute on chronic systolic (congestive) heart failure: Principal | ICD-10-CM | POA: Diagnosis present

## 2012-10-12 DIAGNOSIS — T466X5A Adverse effect of antihyperlipidemic and antiarteriosclerotic drugs, initial encounter: Secondary | ICD-10-CM | POA: Diagnosis present

## 2012-10-12 DIAGNOSIS — M129 Arthropathy, unspecified: Secondary | ICD-10-CM | POA: Diagnosis present

## 2012-10-12 DIAGNOSIS — J45909 Unspecified asthma, uncomplicated: Secondary | ICD-10-CM | POA: Diagnosis present

## 2012-10-12 DIAGNOSIS — I252 Old myocardial infarction: Secondary | ICD-10-CM

## 2012-10-12 DIAGNOSIS — Z880 Allergy status to penicillin: Secondary | ICD-10-CM

## 2012-10-12 DIAGNOSIS — I059 Rheumatic mitral valve disease, unspecified: Secondary | ICD-10-CM | POA: Diagnosis present

## 2012-10-12 DIAGNOSIS — Z7901 Long term (current) use of anticoagulants: Secondary | ICD-10-CM

## 2012-10-12 DIAGNOSIS — Z87891 Personal history of nicotine dependence: Secondary | ICD-10-CM

## 2012-10-12 DIAGNOSIS — I4891 Unspecified atrial fibrillation: Secondary | ICD-10-CM | POA: Diagnosis present

## 2012-10-12 DIAGNOSIS — E119 Type 2 diabetes mellitus without complications: Secondary | ICD-10-CM | POA: Diagnosis present

## 2012-10-12 DIAGNOSIS — I509 Heart failure, unspecified: Secondary | ICD-10-CM | POA: Diagnosis present

## 2012-10-12 DIAGNOSIS — Y9210 Unspecified residential institution as the place of occurrence of the external cause: Secondary | ICD-10-CM | POA: Diagnosis present

## 2012-10-12 DIAGNOSIS — Z954 Presence of other heart-valve replacement: Secondary | ICD-10-CM

## 2012-10-12 DIAGNOSIS — I4821 Permanent atrial fibrillation: Secondary | ICD-10-CM | POA: Diagnosis present

## 2012-10-12 DIAGNOSIS — F411 Generalized anxiety disorder: Secondary | ICD-10-CM | POA: Diagnosis present

## 2012-10-12 DIAGNOSIS — Z79899 Other long term (current) drug therapy: Secondary | ICD-10-CM

## 2012-10-12 HISTORY — DX: Heart failure, unspecified: I50.9

## 2012-10-12 HISTORY — DX: Essential (primary) hypertension: I10

## 2012-10-12 LAB — CBC WITH DIFFERENTIAL/PLATELET
Basophils Absolute: 0.1 10*3/uL (ref 0.0–0.1)
Basophils Relative: 1 % (ref 0–1)
Hemoglobin: 10.5 g/dL — ABNORMAL LOW (ref 13.0–17.0)
MCHC: 33 g/dL (ref 30.0–36.0)
Monocytes Relative: 7 % (ref 3–12)
Neutro Abs: 8 10*3/uL — ABNORMAL HIGH (ref 1.7–7.7)
Neutrophils Relative %: 76 % (ref 43–77)
RDW: 14.3 % (ref 11.5–15.5)

## 2012-10-12 LAB — PROTIME-INR
INR: 1.73 — ABNORMAL HIGH (ref 0.00–1.49)
Prothrombin Time: 19.7 seconds — ABNORMAL HIGH (ref 11.6–15.2)

## 2012-10-12 LAB — POCT I-STAT TROPONIN I: Troponin i, poc: 0.04 ng/mL (ref 0.00–0.08)

## 2012-10-12 LAB — BASIC METABOLIC PANEL
Chloride: 103 mEq/L (ref 96–112)
GFR calc Af Amer: >90 mL/min (ref >90)
Potassium: 4.2 mEq/L (ref 3.5–5.1)

## 2012-10-12 LAB — GLUCOSE, CAPILLARY: Glucose-Capillary: 155 mg/dL — ABNORMAL HIGH (ref 70–99)

## 2012-10-12 MED ORDER — ALPRAZOLAM 0.25 MG PO TABS: 0.2500 mg | ORAL_TABLET | Freq: Three times a day (TID) | ORAL | Status: DC | PRN

## 2012-10-12 MED ORDER — METOPROLOL TARTRATE 25 MG PO TABS
25.0000 mg | ORAL_TABLET | Freq: Two times a day (BID) | ORAL | Status: DC
  Administered 2012-10-12 – 2012-10-13 (×2): 25 mg via ORAL
  Filled 2012-10-12 (×3): qty 1

## 2012-10-12 MED ORDER — DIPHENHYDRAMINE HCL 50 MG/ML IJ SOLN
25.0000 mg | Freq: Four times a day (QID) | INTRAMUSCULAR | Status: DC | PRN
  Administered 2012-10-13 – 2012-10-14 (×2): 25 mg via INTRAVENOUS
  Filled 2012-10-12 (×2): qty 1

## 2012-10-12 MED ORDER — NIACIN-SIMVASTATIN ER 500-20 MG PO TB24: 1.0000 | ORAL_TABLET | Freq: Every day | ORAL | Status: DC

## 2012-10-12 MED ORDER — METFORMIN HCL ER 750 MG PO TB24
2000.0000 mg | ORAL_TABLET | Freq: Every day | ORAL | Status: DC
  Administered 2012-10-12 – 2012-10-13 (×2): 2000 mg via ORAL
  Filled 2012-10-12 (×3): qty 1

## 2012-10-12 MED ORDER — SODIUM CHLORIDE 0.9 % IJ SOLN
3.0000 mL | INTRAMUSCULAR | Status: DC | PRN
  Administered 2012-10-14: 3 mL via INTRAVENOUS

## 2012-10-12 MED ORDER — POTASSIUM CHLORIDE CRYS ER 20 MEQ PO TBCR
40.0000 meq | EXTENDED_RELEASE_TABLET | Freq: Every day | ORAL | Status: DC
  Administered 2012-10-12 – 2012-10-16 (×5): 40 meq via ORAL
  Filled 2012-10-12 (×5): qty 2

## 2012-10-12 MED ORDER — SPIRONOLACTONE 25 MG PO TABS
25.0000 mg | ORAL_TABLET | Freq: Every day | ORAL | Status: DC
  Administered 2012-10-12 – 2012-10-15 (×4): 25 mg via ORAL
  Filled 2012-10-12 (×5): qty 1

## 2012-10-12 MED ORDER — ONDANSETRON HCL 4 MG/2ML IJ SOLN: 4.0000 mg | Freq: Four times a day (QID) | INTRAMUSCULAR | Status: DC | PRN

## 2012-10-12 MED ORDER — IRBESARTAN 150 MG PO TABS
150.0000 mg | ORAL_TABLET | Freq: Every day | ORAL | Status: DC
  Administered 2012-10-12 – 2012-10-16 (×5): 150 mg via ORAL
  Filled 2012-10-12 (×5): qty 1

## 2012-10-12 MED ORDER — BUMETANIDE 0.25 MG/ML IJ SOLN
1.0000 mg | Freq: Once | INTRAMUSCULAR | Status: AC
  Administered 2012-10-12: 1 mg via INTRAVENOUS
  Filled 2012-10-12: qty 4

## 2012-10-12 MED ORDER — SODIUM CHLORIDE 0.9 % IV SOLN
250.0000 mL | INTRAVENOUS | Status: DC | PRN
  Administered 2012-10-15: 250 mL via INTRAVENOUS

## 2012-10-12 MED ORDER — ACETAMINOPHEN 325 MG PO TABS: 650.0000 mg | ORAL_TABLET | ORAL | Status: DC | PRN

## 2012-10-12 MED ORDER — MOMETASONE FURO-FORMOTEROL FUM 100-5 MCG/ACT IN AERO
2.0000 | INHALATION_SPRAY | Freq: Two times a day (BID) | RESPIRATORY_TRACT | Status: DC
  Administered 2012-10-12 – 2012-10-16 (×8): 2 via RESPIRATORY_TRACT
  Filled 2012-10-12: qty 8.8

## 2012-10-12 MED ORDER — WARFARIN SODIUM 7.5 MG PO TABS
17.5000 mg | ORAL_TABLET | Freq: Once | ORAL | Status: AC
  Administered 2012-10-12: 17.5 mg via ORAL
  Filled 2012-10-12: qty 1

## 2012-10-12 MED ORDER — GLYBURIDE 5 MG PO TABS
5.0000 mg | ORAL_TABLET | Freq: Every day | ORAL | Status: DC
  Administered 2012-10-13 – 2012-10-16 (×4): 5 mg via ORAL
  Filled 2012-10-12 (×5): qty 1

## 2012-10-12 MED ORDER — WARFARIN - PHARMACIST DOSING INPATIENT
Freq: Every day | Status: DC
  Administered 2012-10-14: 18:00:00

## 2012-10-12 MED ORDER — ALBUTEROL SULFATE HFA 108 (90 BASE) MCG/ACT IN AERS
2.0000 | INHALATION_SPRAY | Freq: Four times a day (QID) | RESPIRATORY_TRACT | Status: DC | PRN
  Filled 2012-10-12: qty 6.7

## 2012-10-12 MED ORDER — SODIUM CHLORIDE 0.9 % IJ SOLN
3.0000 mL | Freq: Two times a day (BID) | INTRAMUSCULAR | Status: DC
  Administered 2012-10-12 – 2012-10-15 (×7): 3 mL via INTRAVENOUS

## 2012-10-12 MED ORDER — NIACIN 500 MG PO TABS
500.0000 mg | ORAL_TABLET | Freq: Every day | ORAL | Status: DC
  Administered 2012-10-12 – 2012-10-14 (×3): 500 mg via ORAL
  Filled 2012-10-12 (×4): qty 1

## 2012-10-12 MED ORDER — INSULIN ASPART 100 UNIT/ML ~~LOC~~ SOLN
0.0000 [IU] | Freq: Three times a day (TID) | SUBCUTANEOUS | Status: DC
  Administered 2012-10-13: 3 [IU] via SUBCUTANEOUS
  Administered 2012-10-13: 2 [IU] via SUBCUTANEOUS
  Administered 2012-10-13 – 2012-10-14 (×3): 5 [IU] via SUBCUTANEOUS
  Administered 2012-10-14: 3 [IU] via SUBCUTANEOUS
  Administered 2012-10-15: 5 [IU] via SUBCUTANEOUS
  Administered 2012-10-15: 3 [IU] via SUBCUTANEOUS
  Administered 2012-10-15: 5 [IU] via SUBCUTANEOUS
  Administered 2012-10-16: 3 [IU] via SUBCUTANEOUS
  Administered 2012-10-16: 5 [IU] via SUBCUTANEOUS

## 2012-10-12 MED ORDER — SIMVASTATIN 20 MG PO TABS
20.0000 mg | ORAL_TABLET | Freq: Every day | ORAL | Status: DC
  Administered 2012-10-12: 20 mg via ORAL
  Filled 2012-10-12 (×2): qty 1

## 2012-10-12 MED ORDER — LIRAGLUTIDE 18 MG/3ML ~~LOC~~ SOLN
1.8000 mg | Freq: Every day | SUBCUTANEOUS | Status: DC
  Administered 2012-10-14 – 2012-10-16 (×3): 1.8 mg via SUBCUTANEOUS
  Filled 2012-10-12 (×2): qty 0.3

## 2012-10-12 MED ORDER — ESCITALOPRAM OXALATE 10 MG PO TABS
10.0000 mg | ORAL_TABLET | Freq: Every day | ORAL | Status: DC
  Administered 2012-10-12 – 2012-10-15 (×4): 10 mg via ORAL
  Filled 2012-10-12 (×5): qty 1

## 2012-10-12 MED ORDER — OXYCODONE HCL 5 MG PO TABS: 5.0000 mg | ORAL_TABLET | ORAL | Status: DC | PRN

## 2012-10-12 MED ORDER — ZOLPIDEM TARTRATE 5 MG PO TABS: 10.0000 mg | ORAL_TABLET | Freq: Every evening | ORAL | Status: DC | PRN

## 2012-10-12 NOTE — ED Notes (Addendum)
Pt reports, "I think it's my asthma." Pt c/o right sided chest pain with shortness of breath. Pt had recent open heart surgery 09/29/2012. Pt took his normal asthma medication PTA. Pt with labored breathing. Pt reports usually sleeps in a recliner but last night the recliner didn't help with breathing.

## 2012-10-12 NOTE — H&P (Signed)
Joseph Hoffman is an 61 y.o. male.   Chief Complaint:  SOB HPI:    He has a history of longstanding atrial fibrillation dating back to 96.   He was just discharge after aortic valve replacement with mechanical valve.  An echo in March did reveal moderately severe aortic stenosis with a mean gradient of 53, peak instantaneous maximum gradient of 85, and a calculated aortic valve area of 1.0. He did have severe dilation of his left atrium and moderately severe dilatation of his right atrium. Ejection fraction was 45-50%. He does have persistent atrial fibrillation. The patient works for [employer] and has been with a Chief Strategy Officer working in [place].  He did undergo repeat echo Doppler studies to further evaluate his aortic stenosis. This was done on August 22, 2012. Again, the present study suggests an ejection fraction in the 40-45% range. He again had significant biatrial enlargement. He did have moderately severe calcification, predominantly of his non-coronary cusp, and his aortic stenosis seems to have further progressed such that now his mean gradient was 69 and peak instantaneous gradient 104, giving a calculated aortic valve area of 0.85 cm2. The estimated RV systolic pressure was 29 mm. He did have mild mitral regurgitation and mitral annular calcification. His history also includes NISCM, asthma, DM, OSA.  He presents with progressive SOD, orthopnea and LEE.  This got worse today so he came to the ER for evaluation.  Patient also reports alternating hot and cold flashes causing diaphoresis at night as well as right-sided chest pain, dizziness. He denies nausea, vomiting, fever, palpitations, abdominal pain, dysuria, hematuria, hematochezia, melena, constipation, diarrhea.  Past Medical History  Diagnosis Date  . Aortic stenosis   . Myocardial infarction   . Dysrhythmia     a fib  . Anxiety   . Asthma   . Sleep apnea     uses CPAP  . Diabetes mellitus without complication   . Heart  murmur   . Arthritis   . Hepatitis 2003  . S/P AVR (aortic valve replacement), 09/29/12 09/30/2012  . Permanent atrial fibrillation, since 1994 09/30/2012  . DM (diabetes mellitus) 09/30/2012  . Asymptomatic LV dysfunction, EF 45%, normal coronary arteries on cardiac cath 09/18/12 09/30/2012  . Chronic anticoagulation, on Xarelto prior to admit 09/30/2012    Past Surgical History  Procedure Date  . Cholecystectomy   . Fracture surgery   . Cardiac catheterization   . Cardiac valve replacement     AVR 09-29-12  . Aortic valve replacement 09/29/2012    Procedure: AORTIC VALVE REPLACEMENT (AVR);  Surgeon: Gaye Pollack, MD;  Location: [facility];  Service: Open Heart Surgery;  Laterality: N/A;    No family history on file. Social History:  reports that he quit smoking about 30 years ago. His smoking use included Cigarettes. He has a 2.5 pack-year smoking history. He has never used smokeless tobacco. He reports that he does not drink alcohol or use illicit drugs.  Allergies:  Allergies  Allergen Reactions  . Iohexol Anaphylaxis  . Penicillins Other (See Comments)    Unknown.[name]  . Lasix (Furosemide) Rash     (Not in a hospital admission)  Results for orders placed during the hospital encounter of 10/12/12 (from the past 48 hour(s))  CBC WITH DIFFERENTIAL     Status: Abnormal   Collection Time   10/12/12 12:39 PM      Component Value Range Comment   WBC 10.5  4.0 - 10.5 K/uL  RBC 3.75 (*) 4.22 - 5.81 MIL/uL    Hemoglobin 10.5 (*) 13.0 - 17.0 g/dL    HCT 31.8 (*) 39.0 - 52.0 %    MCV 84.8  78.0 - 100.0 fL    MCH 28.0  26.0 - 34.0 pg    MCHC 33.0  30.0 - 36.0 g/dL    RDW 14.3  11.5 - 15.5 %    Platelets 280  150 - 400 K/uL    Neutrophils Relative 76  43 - 77 %    Neutro Abs 8.0 (*) 1.7 - 7.7 K/uL    Lymphocytes Relative 12  12 - 46 %    Lymphs Abs 1.3  0.7 - 4.0 K/uL    Monocytes Relative 7  3 - 12 %    Monocytes Absolute 0.8  0.1 - 1.0 K/uL     Eosinophils Relative 4  0 - 5 %    Eosinophils Absolute 0.4  0.0 - 0.7 K/uL    Basophils Relative 1  0 - 1 %    Basophils Absolute 0.1  0.0 - 0.1 K/uL   BASIC METABOLIC PANEL     Status: Abnormal   Collection Time   10/12/12 12:39 PM      Component Value Range Comment   Sodium 138  135 - 145 mEq/L    Potassium 4.2  3.5 - 5.1 mEq/L    Chloride 103  96 - 112 mEq/L    CO2 23  19 - 32 mEq/L    Glucose, Bld 184 (*) 70 - 99 mg/dL    BUN 16  6 - 23 mg/dL    Creatinine, Ser 0.78  0.50 - 1.35 mg/dL    Calcium 9.1  8.4 - 10.5 mg/dL    GFR calc non Af Amer >90  >90 mL/min    GFR calc Af Amer >90  >90 mL/min   POCT I-STAT TROPONIN I     Status: Normal   Collection Time   10/12/12 12:55 PM      Component Value Range Comment   Troponin i, poc 0.04  0.00 - 0.08 ng/mL    Comment 3            PROTIME-INR     Status: Abnormal   Collection Time   10/12/12  2:00 PM      Component Value Range Comment   Prothrombin Time 19.7 (*) 11.6 - 15.2 seconds    INR 1.73 (*) 0.00 - 1.49   TROPONIN I     Status: Normal   Collection Time   10/12/12  2:00 PM      Component Value Range Comment   Troponin I <0.30  <0.30 ng/mL   PRO B NATRIURETIC PEPTIDE     Status: Abnormal   Collection Time   10/12/12  2:00 PM      Component Value Range Comment   Pro B Natriuretic peptide (BNP) 2257.0 (*) 0 - 125 pg/mL    Dg Chest 2 View  10/12/2012  *RADIOLOGY REPORT*  Clinical Data: Asthma, chest pain  CHEST - 2 VIEW  Comparison: 10/01/2012  Findings: Cardiomediastinal silhouette is stable.  Status post median sternotomy and cardiac valve replacement.  Small left pleural effusion.  Bilateral basilar atelectasis or infiltrate.  No pulmonary edema. Mild degenerative changes thoracic spine.  IMPRESSION: No pulmonary edema.  Small left pleural effusion.  Bilateral basilar atelectasis or infiltrate.   Original Report Authenticated By: Lahoma Crocker, M.D.     Review of Systems  Constitutional: Positive  for diaphoresis. Negative  for fever.  HENT: Negative for congestion.   Eyes: Negative for blurred vision.  Respiratory: Positive for shortness of breath. Negative for cough and wheezing.   Cardiovascular: Positive for chest pain, orthopnea and leg swelling. Negative for palpitations.  Gastrointestinal: Negative for nausea, vomiting, abdominal pain, diarrhea, constipation, blood in stool and melena.  Genitourinary: Negative for dysuria and hematuria.  Neurological: Positive for dizziness.    Blood pressure 153/82, pulse 77, temperature 98 F (36.7 C), temperature source Oral, resp. rate 15, SpO2 98.00%. Physical Exam  Constitutional: He is oriented to person, place, and time. He appears well-developed. No distress.       obese  HENT:  Head: Normocephalic and atraumatic.  Eyes: EOM are normal. Pupils are equal, round, and reactive to light. No scleral icterus.  Neck: Normal range of motion. Neck supple.  Cardiovascular: Normal rate.  An irregularly irregular rhythm present.  No murmur heard. Pulses:      Radial pulses are 2+ on the right side, and 2+ on the left side.       Dorsalis pedis pulses are 2+ on the right side, and 2+ on the left side.       Mechanical valve clicks noted No carotid bruit   Respiratory: Effort normal and breath sounds normal. No respiratory distress. He has no wheezes. He has no rales.  GI: Soft. Bowel sounds are normal. He exhibits no distension. There is no tenderness.  Musculoskeletal: He exhibits edema.       1-2+ lower extremity edema  Lymphadenopathy:    He has no cervical adenopathy.  Neurological: He is alert and oriented to person, place, and time.  Skin: Skin is warm and dry.  Psychiatric: He has a normal mood and affect.     Assessment/Plan Patient Active Hospital Problem List: Acute on chronic systolic heart failure ([id number]) S/P AVR (aortic valve replacement), 09/29/12, St. Jude mechanical aortic valve (09/30/2012) Permanent atrial fibrillation, since 1994  (09/30/2012) DM (diabetes mellitus) (09/30/2012) Sleep apnea (09/30/2012) Chronic anticoagulation, on Xarelto prior to admit (09/30/2012) Nonischemic cardiomyopathy, EF 40-45% (10/12/2012)  Plan:  Patient be admitted for observation tonight to heart failure floor 4 acute on chronic systolic heart failure. He'll be given IV Bumex 1mg  x 1.  This is confirmed with pharmacy regarding the overlap an allergy to Lasix.  We'll add an ACE for afterload reduction.  Strict I and O's.  Daily weights. Low-sodium diet.   HAGER, BRYAN 10/12/2012, 5:00 PM  I seen and evaluated the patient this afternoon along with the Tarri Fuller, PA.. I agree with his findings, examination as well as impression recommendations.  The patient is a 61 year old gentleman who was recently discharged status post aortic valve replacement on 10/28 with mechanical valve.  He has chronic atrial fibrillation otherwise had a relatively normal postop course. He had the standard postop volume overload and was discharged on Lasix. However he developed a severe rash was thought to be due to the rate Lasix and therefore this was stopped.   Over the last couple days she's been noticing increased chest tightness and shortness of breath and orthopnea with persistent edema. The most notable chest discomfort this has him on the right side is not anginal in nature.  He is also been coughing a little been wheezing more so than normal.  In the emergency room his BNP is elevated and his symptoms are concerning for Acute on Chronic Systolic/Diastolic Heart Failure related to persistent volume overload status post aortic valve  replacement operation. He is in no distress with a blood pressure somewhat elevated. We'll therefore admit him for adjustment of his afterload reduction medications and diuresis. We talked about potential diuretics but do not have overlapping side effects of with pharmacy and they recommended bumetanide as a loop diuretic. Will add this  to spironolactone as well as an ARB (he was on an ARB prior to his valve surgery) for rectal reduction. I anticipate a relatively short hospitalization but did feel he warrants admission for least observation purposes to allow for aggressive diuresis overnight and then switch to oral medication he can tolerate.  Continue warfarin for permanent A. fib and mechanical aVR. Lahey been on Xarelto prior to admission but now my understanding is that he is on warfarin. Continue home diabetes medications.  Leonie Man, M.D., M.S. [facility] [address] [place],   [address]  [phone] Pager # [phone] 10/12/2012 6:38 PM

## 2012-10-12 NOTE — ED Provider Notes (Signed)
History     CSN: [id number]  Arrival date & time 10/12/12  1202   First MD Initiated Contact with Patient 10/12/12 1338      Chief Complaint  Patient presents with  . Chest Pain  . Shortness of Breath    HPI  The patient presents with concerns of ongoing chest pain and dyspnea.  Notably, the patient had a an aortic valve replacement 2 weeks ago.  He states that aside from one of drug related rash he was recovering well until approximately 3 days ago.  Since that time he is to the right sided chest pain and dyspnea.  Symptoms are more pronounced at night.  The pain is dull, nonradiating, nonexertional or pleuritic, anterior only.  He denies concurrent lightheadedness, syncope, left sided chest pain, but does endorse nausea.  No clear alleviating or exacerbating other factors.  No fevers.  The patient does note that he is alternatingly chills and diaphoretic for each of the past  Past Medical History  Diagnosis Date  . Aortic stenosis   . Myocardial infarction   . Dysrhythmia     a fib  . Anxiety   . Asthma   . Sleep apnea     uses CPAP  . Diabetes mellitus without complication   . Heart murmur   . Arthritis   . Hepatitis 2003  . S/P AVR (aortic valve replacement), 09/29/12 09/30/2012  . Permanent atrial fibrillation, since 1994 09/30/2012  . DM (diabetes mellitus) 09/30/2012  . Asymptomatic LV dysfunction, EF 45%, normal coronary arteries on cardiac cath 09/18/12 09/30/2012  . Chronic anticoagulation, on Xarelto prior to admit 09/30/2012    Past Surgical History  Procedure Date  . Cholecystectomy   . Fracture surgery   . Cardiac catheterization   . Cardiac valve replacement     AVR 09-29-12  . Aortic valve replacement 09/29/2012    Procedure: AORTIC VALVE REPLACEMENT (AVR);  Surgeon: Gaye Pollack, MD;  Location: [facility];  Service: Open Heart Surgery;  Laterality: N/A;    No family history on file.  History  Substance Use Topics  . Smoking status: Former Smoker --  0.2 packs/day for 10 years    Types: Cigarettes    Quit date: 09/25/1982  . Smokeless tobacco: Never Used  . Alcohol Use: No     Comment: social      Review of Systems  Constitutional:       Per HPI, otherwise negative  HENT:       Per HPI, otherwise negative  Eyes: Negative.   Respiratory:       Per HPI, otherwise negative  Cardiovascular:       Per HPI, otherwise negative  Gastrointestinal: Positive for nausea. Negative for vomiting and diarrhea.  Genitourinary: Negative.   Musculoskeletal:       Per HPI, otherwise negative  Skin: Positive for rash.  Neurological: Negative for syncope.    Allergies  Iohexol and Penicillins  Home Medications   Current Outpatient Rx  Name  Route  Sig  Dispense  Refill  . ALBUTEROL SULFATE HFA 108 (90 BASE) MCG/ACT IN AERS   Inhalation   Inhale 2 puffs into the lungs every 6 (six) hours as needed. For shortness of breath         . VITAMIN C 1000 MG PO TABS   Oral   Take 1,000 mg by mouth daily.         [name] CANAGLIFLOZIN 300 MG PO TABS   Oral  Take 1 tablet by mouth every morning.          [name] VITAMIN D 1000 UNITS PO TABS   Oral   Take 1,000 Units by mouth daily.         . CHROMIUM 200 MCG PO CAPS   Oral   Take 200 mcg by mouth daily.         [name] ESCITALOPRAM OXALATE 10 MG PO TABS   Oral   Take 10 mg by mouth every evening.          [name] FLUTICASONE-SALMETEROL 250-50 MCG/DOSE IN AEPB   Inhalation   Inhale 1 puff into the lungs 2 (two) times daily as needed. For asthma related symptoms         . FUROSEMIDE 40 MG PO TABS   Oral   Take 1 tablet (40 mg total) by mouth daily. FOr 5 days   5 tablet   0   . GLUCOSAMINE 1500 COMPLEX PO   Oral   Take 1,500 mg by mouth daily.         . GLYBURIDE 5 MG PO TABS   Oral   Take 5 mg by mouth daily with breakfast.         . INSULIN GLARGINE 100 UNIT/ML [place] SOLN   Subcutaneous   Inject 5-20 Units into the skin 2 (two) times daily. 5-20 units at breakfast and dinner  depending on blood sugar levels         . LIRAGLUTIDE 18 MG/3ML [place] SOLN   Subcutaneous   Inject 18 mg into the skin daily with breakfast.         . METFORMIN HCL ER 500 MG PO TB24   Oral   Take 2,000 mg by mouth at bedtime.         [name] METOPROLOL TARTRATE 25 MG PO TABS   Oral   Take 1 tablet (25 mg total) by mouth 2 (two) times daily.   60 tablet   1   . CENTRUM SPECIALIST HEART PO   Oral   Take 1 tablet by mouth 2 (two) times daily.         [name] NIACIN-SIMVASTATIN ER 500-20 MG PO TB24   Oral   Take 1 tablet by mouth at bedtime.         . OXYCODONE HCL 5 MG PO TABS   Oral   Take 1 tablet (5 mg total) by mouth every 4 (four) hours as needed.   30 tablet   0   . POTASSIUM CHLORIDE CRYS ER 20 MEQ PO TBCR   Oral   Take 2 tablets (40 mEq total) by mouth daily. FOr 5 days   10 tablet   0   . WARFARIN SODIUM 10 MG PO TABS   Oral   Take 10-15 mg by mouth every evening. 10 mg mon thru sat and 15 mg on sun           BP 166/95  Pulse 104  Temp 97.9 F (36.6 C) (Oral)  Resp 25  SpO2 96%  Physical Exam  Nursing note and vitals reviewed. Constitutional: He is oriented to person, place, and time. He appears well-developed. No distress.  HENT:  Head: Normocephalic and atraumatic.  Eyes: Conjunctivae normal and EOM are normal.  Cardiovascular: Regular rhythm.  Tachycardia present.   Murmur heard.      Audible click consistent with mechanical valve  Pulmonary/Chest: Effort normal. No stridor. No respiratory distress.  Abdominal: He exhibits no distension.  Musculoskeletal: He exhibits no edema.  Neurological: He is alert and oriented to person, place, and time.  Skin: Skin is warm and dry.  Psychiatric: He has a normal mood and affect.    ED Course  Procedures (including critical care time)  Labs Reviewed  CBC WITH DIFFERENTIAL - Abnormal; Notable for the following:    RBC 3.75 (*)     Hemoglobin 10.5 (*)     HCT 31.8 (*)     Neutro Abs 8.0 (*)     All  other components within normal limits  BASIC METABOLIC PANEL - Abnormal; Notable for the following:    Glucose, Bld 184 (*)     All other components within normal limits  POCT I-STAT TROPONIN I  PROTIME-INR  TROPONIN I   Dg Chest 2 View  10/12/2012  *RADIOLOGY REPORT*  Clinical Data: Asthma, chest pain  CHEST - 2 VIEW  Comparison: 10/01/2012  Findings: Cardiomediastinal silhouette is stable.  Status post median sternotomy and cardiac valve replacement.  Small left pleural effusion.  Bilateral basilar atelectasis or infiltrate.  No pulmonary edema. Mild degenerative changes thoracic spine.  IMPRESSION: No pulmonary edema.  Small left pleural effusion.  Bilateral basilar atelectasis or infiltrate.   Original Report Authenticated By: Lahoma Crocker, M.D.      No diagnosis found.    Date: 10/12/2012  Rate: 89  Rhythm: atrial fibrillation  QRS Axis: right  Intervals: afib  ST/T Wave abnormalities: nonspecific ST/T changes  Conduction Disutrbances:right bundle branch block  Narrative Interpretation:   Old EKG Reviewed: unchanged ABNORMAL  Cardiac: 90afib, abnormal  O2- 99%[place], abnormal     MDM  This patient with a recent history of aortic valve replacement now presents with ongoing dyspnea and right-sided chest pain.  Notably, the patient also describes chills and diaphoresis.  On exam the patient is in no distress, and is afebrile.  He is mildly tachycardic and tachypneic.  The patient has known atrial fibrillation.  His subtherapeutic with his Coumadin dosing.  Given the patient's history, concern for either infection or fluid overload status, he was admitted for further evaluation and management.      Carmin Muskrat, MD 10/12/12 [phone]

## 2012-10-12 NOTE — ED Notes (Signed)
MD at bedside.( Consult )

## 2012-10-12 NOTE — Progress Notes (Signed)
ANTICOAGULATION CONSULT NOTE - Initial Consult  Pharmacy Consult for warfarin Indication: atrial fibrillation and Aortic valve replacement with mechanical valve  Allergies  Allergen Reactions  . Iohexol Anaphylaxis  . Penicillins Other (See Comments)    Unknown.[name]  . Lasix (Furosemide) Rash   Vital Signs: Temp: 98 F (36.7 C) (11/10 1545) Temp src: Oral (11/10 1545) BP: 149/72 mmHg (11/10 1800) Pulse Rate: 89  (11/10 1800)  Labs:  Basename 10/12/12 1400 10/12/12 1239  HGB -- 10.5*  HCT -- 31.8*  PLT -- 280  APTT -- --  LABPROT 19.7* --  INR 1.73* --  HEPARINUNFRC -- --  CREATININE -- 0.78  CKTOTAL -- --  CKMB -- --  TROPONINI <0.30 --    The CrCl is unknown because both a height and weight (above a minimum accepted value) are required for this calculation.   Medical History: Past Medical History  Diagnosis Date  . Aortic stenosis   . Myocardial infarction   . Dysrhythmia     a fib  . Anxiety   . Asthma   . Sleep apnea     uses CPAP  . Diabetes mellitus without complication   . Heart murmur   . Arthritis   . Hepatitis 2003  . S/P AVR (aortic valve replacement), 09/29/12 09/30/2012  . Permanent atrial fibrillation, since 1994 09/30/2012  . DM (diabetes mellitus) 09/30/2012  . Asymptomatic LV dysfunction, EF 45%, normal coronary arteries on cardiac cath 09/18/12 09/30/2012  . Chronic anticoagulation, on Xarelto prior to admit 09/30/2012    Medications:  Warfarin 10mg  daily except 15mg  on sundays  Assessment: 61 year old male with long standing afib and recent mechanical aortic valve replacement. Patient is currently on warfarin with INR below goal at 1.7. Will continue coumadin and give slightly higher dose tonight.  Goal of Therapy:  INR 2-3? Monitor platelets by anticoagulation protocol: Yes   Plan:  Warfarin 17.5mg  tonight INR daily Georgina Peer 10/12/2012,7:13 PM

## 2012-10-13 ENCOUNTER — Other Ambulatory Visit: Payer: Self-pay

## 2012-10-13 LAB — BASIC METABOLIC PANEL
BUN: 16 mg/dL (ref 6–23)
CO2: 23 mEq/L (ref 19–32)
Calcium: 9.1 mg/dL (ref 8.4–10.5)
GFR calc non Af Amer: 89 mL/min — ABNORMAL LOW (ref >90)
Glucose, Bld: 154 mg/dL — ABNORMAL HIGH (ref 70–99)
Potassium: 4.3 mEq/L (ref 3.5–5.1)

## 2012-10-13 LAB — GLUCOSE, CAPILLARY
Glucose-Capillary: 143 mg/dL — ABNORMAL HIGH (ref 70–99)
Glucose-Capillary: 268 mg/dL — ABNORMAL HIGH (ref 70–99)

## 2012-10-13 LAB — TROPONIN I: Troponin I: <0.3 ng/mL (ref <0.30)

## 2012-10-13 LAB — PROTIME-INR: INR: 1.81 — ABNORMAL HIGH (ref 0.00–1.49)

## 2012-10-13 MED ORDER — WARFARIN SODIUM 2.5 MG PO TABS
12.5000 mg | ORAL_TABLET | Freq: Once | ORAL | Status: AC
  Administered 2012-10-13: 12.5 mg via ORAL
  Filled 2012-10-13: qty 1

## 2012-10-13 MED ORDER — BUMETANIDE 0.25 MG/ML IJ SOLN
1.0000 mg | Freq: Two times a day (BID) | INTRAMUSCULAR | Status: DC
  Administered 2012-10-13 (×2): 1 mg via INTRAVENOUS
  Filled 2012-10-13 (×4): qty 4

## 2012-10-13 MED ORDER — METOPROLOL TARTRATE 25 MG PO TABS
25.0000 mg | ORAL_TABLET | ORAL | Status: AC
  Administered 2012-10-13: 25 mg via ORAL
  Filled 2012-10-13: qty 1

## 2012-10-13 MED ORDER — METOPROLOL TARTRATE 25 MG PO TABS
37.5000 mg | ORAL_TABLET | Freq: Two times a day (BID) | ORAL | Status: DC
  Administered 2012-10-13 – 2012-10-14 (×2): 37.5 mg via ORAL
  Filled 2012-10-13 (×3): qty 1

## 2012-10-13 MED ORDER — TRIAMCINOLONE ACETONIDE 0.1 % EX LOTN
TOPICAL_LOTION | Freq: Two times a day (BID) | CUTANEOUS | Status: DC
  Administered 2012-10-14 – 2012-10-15 (×2): via TOPICAL
  Filled 2012-10-13 (×2): qty 60

## 2012-10-13 MED ORDER — LOPERAMIDE HCL 2 MG PO CAPS
4.0000 mg | ORAL_CAPSULE | Freq: Once | ORAL | Status: AC
  Administered 2012-10-13: 4 mg via ORAL
  Filled 2012-10-13: qty 2

## 2012-10-13 MED ORDER — ATORVASTATIN CALCIUM 10 MG PO TABS
10.0000 mg | ORAL_TABLET | Freq: Every day | ORAL | Status: DC
  Filled 2012-10-13: qty 1

## 2012-10-13 MED ORDER — DILTIAZEM HCL 60 MG PO TABS
60.0000 mg | ORAL_TABLET | Freq: Three times a day (TID) | ORAL | Status: DC
  Filled 2012-10-13 (×3): qty 1

## 2012-10-13 MED ORDER — SIMVASTATIN 20 MG PO TABS
20.0000 mg | ORAL_TABLET | Freq: Every day | ORAL | Status: DC
  Administered 2012-10-13 – 2012-10-15 (×3): 20 mg via ORAL
  Filled 2012-10-13 (×4): qty 1

## 2012-10-13 NOTE — Progress Notes (Addendum)
Pt's HR going up 150's 160.  Pt asymptomatic.  BP 163/82, P 97.  Will continue to monitor.  Quenton Fetter, PA  Camella Seim,RN.

## 2012-10-13 NOTE — Progress Notes (Signed)
  ANTICOAGULATION CONSULT NOTE - Follow Up Consult  Pharmacy Consult for coumadin Indication: aortic valve replacement  Allergies  Allergen Reactions  . Iohexol Anaphylaxis  . Penicillins Other (See Comments)    Unknown.[name]  . Lasix (Furosemide) Rash    Patient Measurements: Weight: 279 lb 15.8 oz (127 kg)  Vital Signs: Temp: 98.2 F (36.8 C) (11/11 0457) Temp src: Oral (11/11 0457) BP: 172/79 mmHg (11/11 0457) Pulse Rate: 74  (11/11 0457)  Labs:  Basename 10/13/12 0142 10/13/12 0140 10/12/12 2015 10/12/12 1400 10/12/12 1239  HGB -- -- -- -- 10.5*  HCT -- -- -- -- 31.8*  PLT -- -- -- -- 280  APTT -- -- -- -- --  LABPROT -- 20.3* -- 19.7* --  INR -- 1.81* -- 1.73* --  HEPARINUNFRC -- -- -- -- --  CREATININE -- 0.89 -- -- 0.78  CKTOTAL -- -- -- -- --  CKMB -- -- -- -- --  TROPONINI <0.30 -- <0.30 <0.30 --    The CrCl is unknown because both a height and weight (above a minimum accepted value) are required for this calculation.   Medications:  Scheduled:    . [COMPLETED] bumetanide (BUMEX) IV  1 mg Intravenous Once  . escitalopram  10 mg Oral QHS  . glyBURIDE  5 mg Oral Q breakfast  . insulin aspart  0-15 Units Subcutaneous TID WC  . irbesartan  150 mg Oral Daily  . Liraglutide  18 mg Subcutaneous Q breakfast  . metFORMIN  2,000 mg Oral QHS  . metoprolol tartrate  25 mg Oral BID  . mometasone-formoterol  2 puff Inhalation BID  . simvastatin  20 mg Oral QHS   And  . niacin  500 mg Oral QHS  . potassium chloride SA  40 mEq Oral Daily  . sodium chloride  3 mL Intravenous Q12H  . spironolactone  25 mg Oral Daily  . warfarin  12.5 mg Oral ONCE-1800  . [COMPLETED] warfarin  17.5 mg Oral Once  . Warfarin - Pharmacist Dosing Inpatient   Does not apply q1800  . [DISCONTINUED] niacin-simvastatin  1 tablet Oral QHS   Infusions:    Assessment: 61 yo male with aoritc valve replacement is currently on subtherapeutic coumadin.  INR today up to  1.81.  Was on coumadin 10mg  qday except 15mg  on Sundays prior to admission. Goal of Therapy:  INR 2-3    Plan:  1) Coumadin 12.5mg  po x1 2) Daily PT/INR  Kamilo Och, Tsz-Yin 10/13/2012,8:39 AM

## 2012-10-13 NOTE — Progress Notes (Signed)
Called by RN that HR up to 143, S Tach, pt was in BR at time.  BP 123456 systolic.  No complaints, HR down to 111.  Will check EKG

## 2012-10-13 NOTE — Progress Notes (Signed)
Pt. Seen and examined. Agree with the NP/PA-C note as written. Shortness of breath has improved and he is diuresing on bumex. Rash has resolved and may be due to lasix. Will need better HR control, prefer uptitrating b-blocker due to cardiomyopathy for a-fib with RVR and cardiomyopathy. Still has 2+ pitting edema.  Pixie Casino, MD, [facility] Attending Cardiologist [facility]

## 2012-10-13 NOTE — Progress Notes (Signed)
Pt's HR up in 149 per tele and vs 138/61, P81.  Pt asymptomatic.  Notified B hager, and instructed to call if pt sustained 30 minutes or more.  Will continue to monitor.  Karie Kirks, Therapist, sports.

## 2012-10-13 NOTE — Progress Notes (Signed)
[facility]  Subjective: SOB, diaphoretic.    Objective: Vital signs in last 24 hours: Temp:  [97.9 F (36.6 C)-98.2 F (36.8 C)] 98.2 F (36.8 C) (11/11 0457) Pulse Rate:  [62-111] 111  (11/11 0941) Resp:  [15-25] 20  (11/11 0935) BP: (120-172)/(69-95) 120/78 mmHg (11/11 0941) SpO2:  [74 %-99 %] 95 % (11/11 0935) FiO2 (%):  [99 %] 99 % (11/10 1341) Weight:  [127 kg (279 lb 15.8 oz)-127.143 kg (280 lb 4.8 oz)] 127 kg (279 lb 15.8 oz) (11/11 0457)    Intake/Output from previous day: 11/10 0701 - 11/11 0700 In: 100 [P.O.:100] Out: 1700 [Urine:1700] Intake/Output this shift: Total I/O In: 240 [P.O.:240] Out: -   Medications Current Facility-Administered Medications  Medication Dose Route Frequency Provider Last Rate Last Dose  . 0.9 %  sodium chloride infusion  250 mL Intravenous PRN Tarri Fuller, PA      . acetaminophen (TYLENOL) tablet 650 mg  650 mg Oral Q4H PRN Tarri Fuller, PA      . albuterol (PROVENTIL HFA;VENTOLIN HFA) 108 (90 BASE) MCG/ACT inhaler 2 puff  2 puff Inhalation Q6H PRN Tarri Fuller, PA      . ALPRAZolam Duanne Moron) tablet 0.25 mg  0.25 mg Oral TID PRN Erlene Quan, PA      . [COMPLETED] bumetanide (BUMEX) injection 1 mg  1 mg Intravenous Once Tarri Fuller, PA   1 mg at 10/12/12 2107  . diphenhydrAMINE (BENADRYL) injection 25 mg  25 mg Intravenous Q6H PRN Tarri Fuller, PA      . escitalopram (LEXAPRO) tablet 10 mg  10 mg Oral QHS Tarri Fuller, PA   10 mg at 10/12/12 2123  . glyBURIDE (DIABETA) tablet 5 mg  5 mg Oral Q breakfast Tarri Fuller, PA   5 mg at 10/13/12 0940  . insulin aspart (novoLOG) injection 0-15 Units  0-15 Units Subcutaneous TID WC Tarri Fuller, PA   3 Units at 10/13/12 0703  . irbesartan (AVAPRO) tablet 150 mg  150 mg Oral Daily Leonie Man, MD   150 mg at 10/13/12 0940  . Liraglutide SOLN 18 mg  18 mg Subcutaneous Q breakfast Tarri Fuller, PA      . metFORMIN (GLUCOPHAGE-XR) 24 hr tablet 2,000 mg  2,000 mg Oral QHS  Tarri Fuller, PA   2,000 mg at 10/12/12 1917  . metoprolol tartrate (LOPRESSOR) tablet 25 mg  25 mg Oral BID Tarri Fuller, PA   25 mg at 10/13/12 0941  . mometasone-formoterol (DULERA) 100-5 MCG/ACT inhaler 2 puff  2 puff Inhalation BID Tarri Fuller, PA   2 puff at 10/13/12 0934  . simvastatin (ZOCOR) tablet 20 mg  20 mg Oral QHS Leonie Man, MD   20 mg at 10/12/12 2123   And  . niacin tablet 500 mg  500 mg Oral QHS Leonie Man, MD   500 mg at 10/12/12 2126  . ondansetron (ZOFRAN) injection 4 mg  4 mg Intravenous Q6H PRN Tarri Fuller, PA      . oxyCODONE (Oxy IR/ROXICODONE) immediate release tablet 5 mg  5 mg Oral Q4H PRN Tarri Fuller, PA      . potassium chloride SA (K-DUR,KLOR-CON) CR tablet 40 mEq  40 mEq Oral Daily Tarri Fuller, PA   40 mEq at 10/13/12 0941  . sodium chloride 0.9 % injection 3 mL  3 mL Intravenous Q12H Tarri Fuller, PA   3 mL at 10/13/12 0942  . sodium chloride 0.9 % injection 3  mL  3 mL Intravenous PRN Tarri Fuller, PA      . spironolactone (ALDACTONE) tablet 25 mg  25 mg Oral Daily Tarri Fuller, PA   25 mg at 10/13/12 0941  . warfarin (COUMADIN) tablet 12.5 mg  12.5 mg Oral ONCE-1800 Leonie Man, MD      . Margrett Rud warfarin (COUMADIN) tablet 17.5 mg  17.5 mg Oral Once Georgina Peer, PHARMD   17.5 mg at 10/12/12 2125  . Warfarin - Pharmacist Dosing Inpatient   Does not apply q1800 Georgina Peer, PHARMD      . zolpidem [facility]) tablet 10 mg  10 mg Oral QHS PRN Erlene Quan, PA      . [DISCONTINUED] niacin-simvastatin [facility]) 500-20 MG per 24 hr tablet 1 tablet  1 tablet Oral QHS Tarri Fuller, PA        PE: General appearance: alert, cooperative and mild distress Lungs: clear to auscultation bilaterally Heart: irregularly irregular rhythm and Rate elevated Abdomen: +BS, nontender Extremities: 1+ LEE Pulses: 2+ and symmetric Neurologic: Grossly normal  Lab Results:   Basename 10/12/12 1239  WBC 10.5  HGB 10.5*  HCT 31.8*  PLT 280    BMET  Basename 10/13/12 0140 10/12/12 1239  NA 138 138  K 4.3 4.2  CL 101 103  CO2 23 23  GLUCOSE 154* 184*  BUN 16 16  CREATININE 0.89 0.78  CALCIUM 9.1 9.1   PT/INR  Basename 10/13/12 0140 10/12/12 1400  LABPROT 20.3* 19.7*  INR 1.81* 1.73*    Assessment/Plan  Principal Problem:  *Acute on chronic systolic heart failure Active Problems:  S/P AVR (aortic valve replacement), 09/29/12, [employer] mechanical aortic valve  Permanent atrial fibrillation, since 1994  DM (diabetes mellitus)  Sleep apnea  Chronic anticoagulation, on Xarelto prior to admit  Nonischemic cardiomyopathy, EF 40-45%  Plan:  Net fluids -1634ml.  BP stable.  AFib RVR on tele.   Adding diltiazem 60mg  Q8 hr.   INR sub therapeutic.  Pharmacy helping.  Troponin negative x 3.  Will continue Bumex 1mg  BID.  Recheck BNP in the AM.      LOS: 1 day    Townes Fuhs 10/13/2012 10:15 AM

## 2012-10-14 LAB — BASIC METABOLIC PANEL
BUN: 18 mg/dL (ref 6–23)
Chloride: 102 mEq/L (ref 96–112)
GFR calc Af Amer: >90 mL/min (ref >90)
GFR calc non Af Amer: 87 mL/min — ABNORMAL LOW (ref >90)
Potassium: 4 mEq/L (ref 3.5–5.1)
Sodium: 139 mEq/L (ref 135–145)

## 2012-10-14 LAB — GLUCOSE, CAPILLARY
Glucose-Capillary: 238 mg/dL — ABNORMAL HIGH (ref 70–99)
Glucose-Capillary: 221 mg/dL — ABNORMAL HIGH (ref 70–99)
Glucose-Capillary: 177 mg/dL — ABNORMAL HIGH (ref 70–99)

## 2012-10-14 LAB — PROTIME-INR: INR: 2.42 — ABNORMAL HIGH (ref 0.00–1.49)

## 2012-10-14 MED ORDER — METFORMIN HCL ER 500 MG PO TB24
2000.0000 mg | ORAL_TABLET | Freq: Every day | ORAL | Status: DC
  Administered 2012-10-14 – 2012-10-15 (×2): 2000 mg via ORAL
  Filled 2012-10-14: qty 4
  Filled 2012-10-14: qty 1
  Filled 2012-10-14 (×2): qty 4

## 2012-10-14 MED ORDER — DIPHENHYDRAMINE HCL 50 MG/ML IJ SOLN
50.0000 mg | INTRAMUSCULAR | Status: DC | PRN
  Administered 2012-10-15: 50 mg via INTRAVENOUS
  Filled 2012-10-14: qty 1

## 2012-10-14 MED ORDER — ETHACRYNATE SODIUM 50 MG IV SOLR
50.0000 mg | Freq: Once | INTRAVENOUS | Status: DC
  Filled 2012-10-14: qty 50

## 2012-10-14 MED ORDER — WARFARIN SODIUM 10 MG PO TABS
10.0000 mg | ORAL_TABLET | Freq: Once | ORAL | Status: AC
  Administered 2012-10-14: 10 mg via ORAL
  Filled 2012-10-14 (×2): qty 1

## 2012-10-14 MED ORDER — ETHACRYNATE SODIUM 50 MG IV SOLR
50.0000 mg | Freq: Once | INTRAVENOUS | Status: AC
  Administered 2012-10-15: 50 mg via INTRAVENOUS
  Filled 2012-10-14 (×2): qty 50

## 2012-10-14 MED ORDER — METOPROLOL TARTRATE 50 MG PO TABS
75.0000 mg | ORAL_TABLET | Freq: Two times a day (BID) | ORAL | Status: DC
  Administered 2012-10-14 – 2012-10-16 (×4): 75 mg via ORAL
  Filled 2012-10-14 (×5): qty 1

## 2012-10-14 NOTE — Care Management Note (Signed)
    Page 1 of 1   10/14/2012     3:53:43 PM   CARE MANAGEMENT NOTE 10/14/2012  Patient:  Joseph Hoffman, Joseph Hoffman   Account Number:  [id number]  Date Initiated:  10/14/2012  Documentation initiated by:  Llana Aliment  Subjective/Objective Assessment:   61yo male admitted with CHF.  Pt. was just dc from [facility] hospital on 10/03/12 after Aortic Valve Replacement surgery.     Action/Plan:   In to complete HF Screen with pt.  Pt. states his spouse is an Warden/ranger and he wants her to decide about [facility] services.   Anticipated DC Date:  10/16/2012   Anticipated DC Plan:  [place]  CM consult      [facility] Choice  HOME HEALTH   Choice offered to / List presented to:  C-1 Patient           Status of service:  In process, will continue to follow Medicare Important Message given?   (If response is "NO", the following Medicare IM given date fields will be blank) Date Medicare IM given:   Date Additional Medicare IM given:    Discharge Disposition:    Per UR Regulation:  Reviewed for med. necessity/level of care/duration of stay  If discussed at [place] of Stay Meetings, dates discussed:    Comments:  10/14/12 1515 Spoke with pt. about [facility] services and gave pt. list of [place] agencies. Pt. stated he wanted to wait for his spouse to look at list and help him decide.  NCM to follow for dc needs.  Llana Aliment, RN, BSN Nurse Case Manager [phone]

## 2012-10-14 NOTE — Progress Notes (Signed)
Notified by monitor tech that pt had 8 beat run of V-tach. MD aware. No orders given. Pt asymptomatic. Will continue to monitor pt closely.  Eulis Canner, RN

## 2012-10-14 NOTE — Progress Notes (Addendum)
[facility]  Subjective: He reports a severe rash last night with itching.. Benadryl helped. Resolved today.  Objective: Vital signs in last 24 hours: Temp:  [97.5 F (36.4 C)-99 F (37.2 C)] 99 F (37.2 C) (11/11 2036) Pulse Rate:  [81-103] 99  (11/12 1022) Resp:  [20] 20  (11/12 0500) BP: (136-168)/(61-82) 154/82 mmHg (11/12 1022) SpO2:  [94 %-96 %] 94 % (11/12 0500) Weight:  [123.968 kg (273 lb 4.8 oz)] 123.968 kg (273 lb 4.8 oz) (11/12 0500) Last BM Date: 10/13/12  Intake/Output from previous day: 11/11 0701 - 11/12 0700 In: 1100 [P.O.:1080; I.V.:12; IV Piggyback:8] Out: 1300 [Urine:1300] Intake/Output this shift: Total I/O In: 363 [P.O.:360; I.V.:3] Out: -   Medications Current Facility-Administered Medications  Medication Dose Route Frequency Provider Last Rate Last Dose  . 0.9 %  sodium chloride infusion  250 mL Intravenous PRN Tarri Fuller, PA      . acetaminophen (TYLENOL) tablet 650 mg  650 mg Oral Q4H PRN Tarri Fuller, PA      . albuterol (PROVENTIL HFA;VENTOLIN HFA) 108 (90 BASE) MCG/ACT inhaler 2 puff  2 puff Inhalation Q6H PRN Tarri Fuller, PA      . ALPRAZolam Duanne Moron) tablet 0.25 mg  0.25 mg Oral TID PRN Erlene Quan, PA      . bumetanide (BUMEX) injection 1 mg  1 mg Intravenous Q12H Tarri Fuller, PA   1 mg at 10/13/12 2311  . diphenhydrAMINE (BENADRYL) injection 50 mg  50 mg Intravenous Q4H PRN Tarri Fuller, PA      . escitalopram (LEXAPRO) tablet 10 mg  10 mg Oral QHS Tarri Fuller, PA   10 mg at 10/13/12 2308  . glyBURIDE (DIABETA) tablet 5 mg  5 mg Oral Q breakfast Tarri Fuller, PA   5 mg at 10/14/12 [id number]  . insulin aspart (novoLOG) injection 0-15 Units  0-15 Units Subcutaneous TID WC Tarri Fuller, PA   5 Units at 10/14/12 0622  . irbesartan (AVAPRO) tablet 150 mg  150 mg Oral Daily Leonie Man, MD   150 mg at 10/14/12 1020  . Liraglutide SOLN 1.8 mg  1.8 mg Subcutaneous Q breakfast Tarri Fuller, PA   1.8 mg at 10/14/12 0845  .  [COMPLETED] loperamide (IMODIUM) capsule 4 mg  4 mg Oral Once Tarri Fuller, PA   4 mg at 10/13/12 2055  . metFORMIN (GLUCOPHAGE-XR) 24 hr tablet 2,000 mg  2,000 mg Oral QHS Francesca Jewett, PHARMD      . [COMPLETED] metoprolol tartrate (LOPRESSOR) tablet 25 mg  25 mg Oral NOW Pixie Casino, MD   25 mg at 10/13/12 1308  . metoprolol tartrate (LOPRESSOR) tablet 37.5 mg  37.5 mg Oral BID Pixie Casino, MD   37.5 mg at 10/14/12 1022  . mometasone-formoterol (DULERA) 100-5 MCG/ACT inhaler 2 puff  2 puff Inhalation BID Tarri Fuller, PA   2 puff at 10/14/12 0946  . niacin tablet 500 mg  500 mg Oral QHS Leonie Man, MD   500 mg at 10/13/12 2307  . ondansetron (ZOFRAN) injection 4 mg  4 mg Intravenous Q6H PRN Tarri Fuller, PA      . oxyCODONE (Oxy IR/ROXICODONE) immediate release tablet 5 mg  5 mg Oral Q4H PRN Tarri Fuller, PA      . potassium chloride SA (K-DUR,KLOR-CON) CR tablet 40 mEq  40 mEq Oral Daily Tarri Fuller, PA   40 mEq at 10/14/12 1020  . simvastatin (ZOCOR) tablet 20 mg  20  mg Oral q1800 Leonie Man, MD   20 mg at 10/13/12 1724  . sodium chloride 0.9 % injection 3 mL  3 mL Intravenous Q12H Tarri Fuller, PA   3 mL at 10/14/12 1022  . sodium chloride 0.9 % injection 3 mL  3 mL Intravenous PRN Tarri Fuller, PA   3 mL at 10/14/12 0000  . spironolactone (ALDACTONE) tablet 25 mg  25 mg Oral Daily Tarri Fuller, PA   25 mg at 10/14/12 1021  . triamcinolone lotion (KENALOG) 0.1 %   Topical BID Tarri Fuller, PA      . warfarin (COUMADIN) tablet 10 mg  10 mg Oral ONCE-1800 Sandford Craze, PHARMD      . [COMPLETED] warfarin (COUMADIN) tablet 12.5 mg  12.5 mg Oral ONCE-1800 Leonie Man, MD   12.5 mg at 10/13/12 1725  . Warfarin - Pharmacist Dosing Inpatient   Does not apply q1800 Georgina Peer, PHARMD      . zolpidem [facility]) tablet 10 mg  10 mg Oral QHS PRN Erlene Quan, PA      . [DISCONTINUED] diphenhydrAMINE (BENADRYL) injection 25 mg  25 mg Intravenous Q6H PRN Tarri Fuller, PA   25  mg at 10/14/12 0052  . [DISCONTINUED] metFORMIN (GLUCOPHAGE-XR) 24 hr tablet 2,000 mg  2,000 mg Oral QHS Leonie Man, MD   2,000 mg at 10/13/12 2308    PE: General appearance: alert, cooperative and no distress Lungs: clear to auscultation bilaterally Heart: regular rate and rhythm Extremities: Trace LEE Pulses: 2+ and symmetric Skin: No signs of rash on his back or chest.  Skin is warm and dry. Neurologic: Grossly normal  Lab Results:   Basename 10/12/12 1239  WBC 10.5  HGB 10.5*  HCT 31.8*  PLT 280   BMET  Basename 10/14/12 0540 10/13/12 0140 10/12/12 1239  NA 139 138 138  K 4.0 4.3 4.2  CL 102 101 103  CO2 25 23 23   GLUCOSE 238* 154* 184*  BUN 18 16 16   CREATININE 0.95 0.89 0.78  CALCIUM 9.2 9.1 9.1   PT/INR  Basename 10/14/12 0540 10/13/12 0140 10/12/12 1400  LABPROT 25.2* 20.3* 19.7*  INR 2.42* 1.81* 1.73*     Assessment/Plan  Principal Problem:  *Acute on chronic systolic heart failure Active Problems:  S/P AVR (aortic valve replacement), 09/29/12, [employer] Jude mechanical aortic valve  Permanent atrial fibrillation, since 1994  DM (diabetes mellitus)  Sleep apnea  Chronic anticoagulation, on Xarelto prior to admit  Nonischemic cardiomyopathy, EF 40-45%  Plan:  Patient is complaining of rash last night.  He had it last week after taking Lasix. Bumex DCd.  Edema has improved greatly.  He put on his TED hose last night.  Will need to try Ethacrynic Acid.  He is still having episodes of AF RVR to the 120-130.  Increased Lopressor to 75mg  BID.   LOS: 2 days    HAGER, BRYAN 10/14/2012 11:30 AM  I seen and evaluated the patient this morning along with the PA/NP. I agree with their findings, examination as well as impression recommendations.  Rash may well be due to Niacin.  Would hold off on diuretic tonight to see if rash recurs, if not then is likely Loop Diuretic -- will then use Ethacrynic Acid at least for short term & possibly HCTZ once diuresed &  stable post CABG.  The increased BB dose seems to be helping with the bursts of Afib -- may need to consider Amiodarone or Tikosyn  if continues.  Probably another day or so on diuresis - will dose Ethacrynic Acid 50mg  IV x 1 in AM, & may not need more diuresis.  (I suspect some of his UOP has not been accurately recorded b/c he notes more UOP than is apparent in the chart).  On Warfarin - stable INR.  Leonie Man, M.D., M.S. [facility] [address] [place],   [address]  [phone] Pager # [phone] 10/14/2012 5:51 PM

## 2012-10-14 NOTE — Progress Notes (Signed)
UR COMPLETED  

## 2012-10-14 NOTE — Progress Notes (Signed)
Inpatient Diabetes Program Recommendations  AACE/ADA: New Consensus Statement on Inpatient Glycemic Control (2013)  Target Ranges:  Prepandial:   less than 140 mg/dL      Peak postprandial:   less than 180 mg/dL (1-2 hours)      Critically ill patients:  140 - 180 mg/dL   Results for Maud Deed (MRN [id number]) as of 10/14/2012 12:35  Ref. Range 10/13/2012 12:44 10/13/2012 16:13 10/13/2012 21:06 10/14/2012 06:26 10/14/2012 11:13  Glucose-Capillary Latest Range: 70-99 mg/dL 148 (H) 197 (H) 165 (H) 175 (H) 176 (H)  Results for ARISON, BEECROFT (MRN [id number]) as of 10/14/2012 12:38  Ref. Range 10/12/2012 12:39 10/13/2012 01:40 10/14/2012 05:40  Glucose Latest Range: 70-99 mg/dL 184 (H) 154 (H) 238 (H)     Inpatient Diabetes Program Recommendations Insulin - Basal: Please consider starting Lantus 10 units QHS.  Note: Fasting CBG this am was 238 mg/dl.  According to the chart patient takes Lantus 5-20 units at home.  Please consider starting Lantus as recommended.  Will continue to follow. Thanks,  Barnie Alderman, RN, BSN, [place] [facility] Coordinator Inpatient Diabetes Program [phone]

## 2012-10-14 NOTE — Progress Notes (Signed)
ANTICOAGULATION CONSULT NOTE - Follow Up Consult  Pharmacy Consult for Coumadin Indication: aortic valve replacement  Allergies  Allergen Reactions  . Iohexol Anaphylaxis  . Penicillins Other (See Comments)    Unknown.[name]  . Lasix (Furosemide) Rash    Patient Measurements: Weight: 273 lb 4.8 oz (123.968 kg) (scale B)  Vital Signs: Temp src: Oral (11/12 0500) BP: 136/74 mmHg (11/12 0500) Pulse Rate: 89  (11/12 0500)  Labs:  Basename 10/14/12 0540 10/13/12 0920 10/13/12 0142 10/13/12 0140 10/12/12 2015 10/12/12 1400 10/12/12 1239  HGB -- -- -- -- -- -- 10.5*  HCT -- -- -- -- -- -- 31.8*  PLT -- -- -- -- -- -- 280  APTT -- -- -- -- -- -- --  LABPROT 25.2* -- -- 20.3* -- 19.7* --  INR 2.42* -- -- 1.81* -- 1.73* --  HEPARINUNFRC -- -- -- -- -- -- --  CREATININE 0.95 -- -- 0.89 -- -- 0.78  CKTOTAL -- -- -- -- -- -- --  CKMB -- -- -- -- -- -- --  TROPONINI -- <0.30 <0.30 -- <0.30 -- --    The CrCl is unknown because both a height and weight (above a minimum accepted value) are required for this calculation.   Medications:  Scheduled:     . bumetanide (BUMEX) IV  1 mg Intravenous Q12H  . escitalopram  10 mg Oral QHS  . glyBURIDE  5 mg Oral Q breakfast  . insulin aspart  0-15 Units Subcutaneous TID WC  . irbesartan  150 mg Oral Daily  . Liraglutide  1.8 mg Subcutaneous Q breakfast  . [COMPLETED] loperamide  4 mg Oral Once  . metFORMIN  2,000 mg Oral QHS  . [COMPLETED] metoprolol tartrate  25 mg Oral NOW  . metoprolol tartrate  37.5 mg Oral BID  . mometasone-formoterol  2 puff Inhalation BID  . niacin  500 mg Oral QHS  . potassium chloride SA  40 mEq Oral Daily  . simvastatin  20 mg Oral q1800  . sodium chloride  3 mL Intravenous Q12H  . spironolactone  25 mg Oral Daily  . triamcinolone lotion   Topical BID  . [COMPLETED] warfarin  12.5 mg Oral ONCE-1800  . Warfarin - Pharmacist Dosing Inpatient   Does not apply q1800  . [DISCONTINUED]  atorvastatin  10 mg Oral QHS  . [DISCONTINUED] diltiazem  60 mg Oral Q8H  . [DISCONTINUED] metFORMIN  2,000 mg Oral QHS  . [DISCONTINUED] metoprolol tartrate  25 mg Oral BID  . [DISCONTINUED] simvastatin  20 mg Oral QHS   Infusions:    Assessment: 61 yo male with atrial fibrillation and a recent St. Jude's AVR admitted for CHF exacerbation.  His INR was subtherapeutic on admission on a regimen of Coumadin 10mg  qday except 15mg  on Sundays.  He received boosting doses 11/10 and 11/11, and his INR is therapeutic today.  Goal of Therapy:  INR 2-3   Plan:  Decrease Coumadin to 10mg  today Continue daily PT/INR monitoring  Legrand Como, Pharm.D., BCPS Clinical Pharmacist  Phone [phone] Pager [phone] 10/14/2012, 9:26 AM

## 2012-10-15 ENCOUNTER — Encounter (HOSPITAL_COMMUNITY): Payer: Self-pay | Admitting: General Practice

## 2012-10-15 LAB — BASIC METABOLIC PANEL
BUN: 17 mg/dL (ref 6–23)
CO2: 24 mEq/L (ref 19–32)
Calcium: 9.2 mg/dL (ref 8.4–10.5)
Chloride: 106 mEq/L (ref 96–112)
Creatinine, Ser: 0.86 mg/dL (ref 0.50–1.35)
GFR calc Af Amer: >90 mL/min (ref >90)
GFR calc non Af Amer: >90 mL/min (ref >90)
Glucose, Bld: 207 mg/dL — ABNORMAL HIGH (ref 70–99)
Potassium: 4.2 mEq/L (ref 3.5–5.1)
Sodium: 141 mEq/L (ref 135–145)

## 2012-10-15 LAB — PROTIME-INR
INR: 2.06 — ABNORMAL HIGH (ref 0.00–1.49)
Prothrombin Time: 22.4 seconds — ABNORMAL HIGH (ref 11.6–15.2)

## 2012-10-15 LAB — GLUCOSE, CAPILLARY
Glucose-Capillary: 202 mg/dL — ABNORMAL HIGH (ref 70–99)
Glucose-Capillary: 210 mg/dL — ABNORMAL HIGH (ref 70–99)
Glucose-Capillary: 173 mg/dL — ABNORMAL HIGH (ref 70–99)

## 2012-10-15 MED ORDER — ETHACRYNIC ACID 25 MG PO TABS: 25.0000 mg | ORAL_TABLET | Freq: Two times a day (BID) | ORAL | Status: DC

## 2012-10-15 MED ORDER — ALUM & MAG HYDROXIDE-SIMETH 200-200-20 MG/5ML PO SUSP
30.0000 mL | Freq: Four times a day (QID) | ORAL | Status: DC | PRN
  Administered 2012-10-15: 30 mL via ORAL
  Filled 2012-10-15: qty 30

## 2012-10-15 MED ORDER — WARFARIN SODIUM 10 MG PO TABS
12.5000 mg | ORAL_TABLET | Freq: Once | ORAL | Status: AC
  Administered 2012-10-15: 12.5 mg via ORAL
  Filled 2012-10-15: qty 1

## 2012-10-15 MED ORDER — ETHACRYNIC ACID 25 MG PO TABS
25.0000 mg | ORAL_TABLET | Freq: Two times a day (BID) | ORAL | Status: DC
  Administered 2012-10-15: 25 mg via ORAL
  Filled 2012-10-15 (×3): qty 1

## 2012-10-15 NOTE — Progress Notes (Signed)
ANTICOAGULATION CONSULT NOTE - Follow Up Consult  Pharmacy Consult for Coumadin Indication: aortic valve replacement  Allergies  Allergen Reactions  . Iohexol Anaphylaxis  . Penicillins Other (See Comments)    Unknown.[name]  . Lasix (Furosemide) Rash    Patient Measurements: Weight: 272 lb 4.8 oz (123.514 kg) (scale b)  Vital Signs: Temp: 96.6 F (35.9 C) (11/13 0526) Temp src: Oral (11/13 0526) BP: 148/82 mmHg (11/13 0526) Pulse Rate: 85  (11/13 0700)  Labs:  Basename 10/15/12 0605 10/14/12 0540 10/13/12 0920 10/13/12 0142 10/13/12 0140 10/12/12 2015 10/12/12 1239  HGB -- -- -- -- -- -- 10.5*  HCT -- -- -- -- -- -- 31.8*  PLT -- -- -- -- -- -- 280  APTT -- -- -- -- -- -- --  LABPROT 22.4* 25.2* -- -- 20.3* -- --  INR 2.06* 2.42* -- -- 1.81* -- --  HEPARINUNFRC -- -- -- -- -- -- --  CREATININE 0.86 0.95 -- -- 0.89 -- --  CKTOTAL -- -- -- -- -- -- --  CKMB -- -- -- -- -- -- --  TROPONINI -- -- <0.30 <0.30 -- <0.30 --    The CrCl is unknown because both a height and weight (above a minimum accepted value) are required for this calculation.   Medications:  Scheduled:     . escitalopram  10 mg Oral QHS  . ethadrynate sodium (EDECRIN) IVPB  50 mg Intravenous Once  . glyBURIDE  5 mg Oral Q breakfast  . insulin aspart  0-15 Units Subcutaneous TID WC  . irbesartan  150 mg Oral Daily  . Liraglutide  1.8 mg Subcutaneous Q breakfast  . metFORMIN  2,000 mg Oral QHS  . metoprolol tartrate  75 mg Oral BID  . mometasone-formoterol  2 puff Inhalation BID  . niacin  500 mg Oral QHS  . potassium chloride SA  40 mEq Oral Daily  . simvastatin  20 mg Oral q1800  . sodium chloride  3 mL Intravenous Q12H  . spironolactone  25 mg Oral Daily  . triamcinolone lotion   Topical BID  . [COMPLETED] warfarin  10 mg Oral ONCE-1800  . Warfarin - Pharmacist Dosing Inpatient   Does not apply q1800  . [DISCONTINUED] bumetanide (BUMEX) IV  1 mg Intravenous Q12H  .  [DISCONTINUED] ethacrynic acid  50 mg Intravenous Once  . [DISCONTINUED] metoprolol tartrate  37.5 mg Oral BID   Assessment: 61 yo male with atrial fibrillation and a recent St. Jude's AVR admitted for CHF exacerbation.  His INR was subtherapeutic on admission on a regimen of Coumadin 10mg  qday except 15mg  on Sundays.  He received boosting doses 11/10 and 11/11 and his INR is therapeutic today. No bleeding noted.   Goal of Therapy:  INR 2-3   Plan:  Coumadin 12.5mg  po x 1 dose today Continue daily PT/INR monitoring  Thank you,  Francesca Jewett, PharmD 10/15/2012 8:37 AM

## 2012-10-15 NOTE — Progress Notes (Signed)
Water chamber filled

## 2012-10-15 NOTE — Progress Notes (Signed)
Subjective:  Flushing and "rash" last night after Niacin, (immediate release). He was on Simcor at home and tolerated that.   Objective:  Vital Signs in the last 24 hours: Temp:  [96.6 F (35.9 C)-98.9 F (37.2 C)] 96.6 F (35.9 C) (11/13 0526) Pulse Rate:  [79-92] 85  (11/13 0700) Resp:  [18-20] 18  (11/13 0700) BP: (137-158)/(71-97) 148/82 mmHg (11/13 0526) SpO2:  [91 %-96 %] 95 % (11/13 0700) Weight:  [123.514 kg (272 lb 4.8 oz)] 123.514 kg (272 lb 4.8 oz) (11/13 0526)  Intake/Output from previous day:  Intake/Output Summary (Last 24 hours) at 10/15/12 1410 Last data filed at 10/15/12 1146  Gross per 24 hour  Intake   1103 ml  Output   1900 ml  Net   -797 ml    Physical Exam: General appearance: alert, cooperative, no distress and moderately obese Lungs: decreased breath sounds bilat Heart: regular rate and rhythm and positive valve sounds   Rate: 84  Rhythm: normal sinus rhythm  Lab Results: No results found for this basename: WBC:2,HGB:2,PLT:2 in the last 72 hours  Basename 10/15/12 0605 10/14/12 0540  NA 141 139  K 4.2 4.0  CL 106 102  CO2 24 25  GLUCOSE 207* 238*  BUN 17 18  CREATININE 0.86 0.95    Basename 10/13/12 0920 10/13/12 0142  TROPONINI <0.30 <0.30   Hepatic Function Panel No results found for this basename: PROT,ALBUMIN,AST,ALT,ALKPHOS,BILITOT,BILIDIR,IBILI in the last 72 hours No results found for this basename: CHOL in the last 72 hours  Basename 10/15/12 0605  INR 2.06*    Imaging: Imaging results have been reviewed  Cardiac Studies:  Assessment/Plan:   Principal Problem:  *Acute on chronic systolic heart failure Q000111Q Active Problems:  Severe aortic stenosis  S/P AVR, 09/29/12, St. Jude   Nonischemic cardiomyopathy, EF 40-45%  Permanent atrial fibrillation, since 1994  DM (diabetes mellitus)  Sleep apnea  Asymptomatic LV dysfunction, EF 45%, normal coronary arteries on cardiac cath 09/18/12  Chronic anticoagulation, (on  Xarelto prior to admit for CAF) now on Coumadin   Plan- Rash probably from Niacin, not diuretic. Will discuss with MD- I'm not sure he needs Niacin at all as he has NL Cors. ?discharge 24hrs, ? Resume diuretic, he is currently on ethacrynic acid.   Kerin Ransom PA-C 10/15/2012, 2:10 PM

## 2012-10-15 NOTE — Progress Notes (Addendum)
Pt. Seen and examined. Agree with the NP/PA-C note as written.  Possible niacin flushing reaction. Will discontinue it. Continue with ethacrynic acid. He is net negative 1.6L today. Will need to try to arrange home ethacrynic acid tablets. Added 25 mg po BID ethacrynic acid starting tonight.  Could try lasix challenge again as an outpatient or formal "allergy" skin testing as an outpatient.   Pixie Casino, MD, [facility] Attending Cardiologist [facility]

## 2012-10-16 ENCOUNTER — Other Ambulatory Visit (HOSPITAL_COMMUNITY): Payer: Self-pay

## 2012-10-16 DIAGNOSIS — I359 Nonrheumatic aortic valve disorder, unspecified: Secondary | ICD-10-CM

## 2012-10-16 LAB — PROTIME-INR
INR: 2.37 — ABNORMAL HIGH (ref 0.00–1.49)
Prothrombin Time: 24.8 seconds — ABNORMAL HIGH (ref 11.6–15.2)

## 2012-10-16 MED ORDER — ACETAMINOPHEN 325 MG PO TABS: 650.0000 mg | ORAL_TABLET | ORAL | Status: DC | PRN

## 2012-10-16 MED ORDER — WARFARIN SODIUM 10 MG PO TABS
10.0000 mg | ORAL_TABLET | Freq: Once | ORAL | Status: DC
  Filled 2012-10-16: qty 1

## 2012-10-16 MED ORDER — IRBESARTAN 150 MG PO TABS: 150.0000 mg | ORAL_TABLET | Freq: Every day | ORAL | Status: DC

## 2012-10-16 MED ORDER — ALUM & MAG HYDROXIDE-SIMETH 200-200-20 MG/5ML PO SUSP: 30.0000 mL | Freq: Four times a day (QID) | ORAL | Status: DC | PRN

## 2012-10-16 MED ORDER — SIMVASTATIN 20 MG PO TABS: 20.0000 mg | ORAL_TABLET | Freq: Every day | ORAL | Status: DC

## 2012-10-16 NOTE — Progress Notes (Signed)
ANTICOAGULATION CONSULT NOTE - Follow Up Consult  Pharmacy Consult for Coumadin Indication: aortic valve replacement, afib  Allergies  Allergen Reactions  . Iohexol Anaphylaxis  . Niacin And Related     Flushing with immediate realese  . Penicillins Other (See Comments)    Unknown.[name]  . Lasix (Furosemide) Rash    Patient Measurements: Height: 5\' 8"  (172.7 cm) Weight: 267 lb 1.6 oz (121.156 kg) (scale B) IBW/kg (Calculated) : 68.4   Vital Signs: Temp: 97.5 F (36.4 C) (11/14 0435) Temp src: Axillary (11/14 0435) BP: 145/61 mmHg (11/14 0435) Pulse Rate: 82  (11/14 0435)  Labs:  Basename 10/16/12 0620 10/15/12 0605 10/14/12 0540  HGB -- -- --  HCT -- -- --  PLT -- -- --  APTT -- -- --  LABPROT 24.8* 22.4* 25.2*  INR 2.37* 2.06* 2.42*  HEPARINUNFRC -- -- --  CREATININE -- 0.86 0.95  CKTOTAL -- -- --  CKMB -- -- --  TROPONINI -- -- --    Estimated Creatinine Clearance: 111.3 ml/min (by C-G formula based on Cr of 0.86).   Medications:  Scheduled:     . escitalopram  10 mg Oral QHS  . ethacrynic acid  25 mg Oral BID  . glyBURIDE  5 mg Oral Q breakfast  . insulin aspart  0-15 Units Subcutaneous TID WC  . irbesartan  150 mg Oral Daily  . Liraglutide  1.8 mg Subcutaneous Q breakfast  . metFORMIN  2,000 mg Oral QHS  . metoprolol tartrate  75 mg Oral BID  . mometasone-formoterol  2 puff Inhalation BID  . potassium chloride SA  40 mEq Oral Daily  . simvastatin  20 mg Oral q1800  . sodium chloride  3 mL Intravenous Q12H  . spironolactone  25 mg Oral Daily  . triamcinolone lotion   Topical BID  . [COMPLETED] warfarin  12.5 mg Oral ONCE-1800  . Warfarin - Pharmacist Dosing Inpatient   Does not apply q1800  . [DISCONTINUED] ethacrynic acid  25 mg Oral BID  . [DISCONTINUED] niacin  500 mg Oral QHS   Assessment: 61 yo male with atrial fibrillation and a recent St. Jude's AVR admitted for CHF exacerbation.  His INR was subtherapeutic on  admission on a regimen of Coumadin 10mg  qday except 15mg  on Sundays.  He received boosting doses 11/10 and 11/11 and his INR is therapeutic today. No bleeding noted.   Goal of Therapy:  INR 2-3   Plan:  Coumadin 10mg  po x 1 dose today Continue daily PT/INR monitoring  Thank you,  Excell Seltzer, PharmD 10/16/2012 10:11 AM

## 2012-10-16 NOTE — Progress Notes (Signed)
Inpatient Diabetes Program Recommendations  AACE/ADA: New Consensus Statement on Inpatient Glycemic Control (2013)  Target Ranges:  Prepandial:   less than 140 mg/dL      Peak postprandial:   less than 180 mg/dL (1-2 hours)      Critically ill patients:  140 - 180 mg/dL   Reason for Visit: fasting CBGs greater than 180 mg/dl  Inpatient Diabetes Program Recommendations Insulin - Basal: .Consider starting Lantus 10 units daily if CBGs continue greater than 180 mg/dl Correction (SSI): .  Note:

## 2012-10-16 NOTE — Progress Notes (Signed)
Subjective:  No flushing last night.  Objective:  Vital Signs in the last 24 hours: Temp:  [96.9 F (36.1 C)-97.7 F (36.5 C)] 97.5 F (36.4 C) (11/14 0435) Pulse Rate:  [82-90] 82  (11/14 0435) Resp:  [18-20] 18  (11/14 0435) BP: (110-145)/(55-66) 145/61 mmHg (11/14 0435) SpO2:  [95 %-99 %] 97 % (11/14 0846) Weight:  [121.156 kg (267 lb 1.6 oz)] 121.156 kg (267 lb 1.6 oz) (11/14 0435)  Intake/Output from previous day:  Intake/Output Summary (Last 24 hours) at 10/16/12 0943 Last data filed at 10/16/12 0805  Gross per 24 hour  Intake   1400 ml  Output   3800 ml  Net  -2400 ml    Physical Exam: General appearance: alert, cooperative and no distress Lungs: clear to auscultation bilaterally Heart: irregularly irregular rhythm No edema   Rate: 70  Rhythm: atrial fibrillation  Lab Results: No results found for this basename: WBC:2,HGB:2,PLT:2 in the last 72 hours  Basename 10/15/12 0605 10/14/12 0540  NA 141 139  K 4.2 4.0  CL 106 102  CO2 24 25  GLUCOSE 207* 238*  BUN 17 18  CREATININE 0.86 0.95   No results found for this basename: TROPONINI:2,CK,MB:2 in the last 72 hours Hepatic Function Panel No results found for this basename: PROT,ALBUMIN,AST,ALT,ALKPHOS,BILITOT,BILIDIR,IBILI in the last 72 hours No results found for this basename: CHOL in the last 72 hours  Basename 10/16/12 0620  INR 2.37*    Imaging: Imaging results have been reviewed  Cardiac Studies:  Assessment/Plan:   Principal Problem:  *Acute on chronic systolic heart failure Q000111Q Active Problems:  Severe aortic stenosis  S/P AVR, 09/29/12, St. Jude   Nonischemic cardiomyopathy, EF 40-45%  Permanent atrial fibrillation, since 1994  DM (diabetes mellitus)  Sleep apnea  Asymptomatic LV dysfunction, EF 45%, normal coronary arteries on cardiac cath 09/18/12  Chronic anticoagulation, (on Xarelto prior to admit for CAF) now on Coumadin   Plan- Will discuss with MD- ? OK for discharge.  He will need follow up INR in our office for AF and now St Jude AVR. Discussed with Dr Sallyanne Kuster- home off diuretics for now. OP echo, follow up with Dr Claiborne Billings.   Kerin Ransom PA-C 10/16/2012, 9:43 AM    I have seen and examined the patient along with Kerin Ransom PA-C.  I have reviewed the chart, notes and new data.  I agree with PA's note.  Key new complaints: breathing well when walking Key examination changes: irregular rhythm, crisp prosthetic valve clicks. Key new findings / data: weight decreased > 6 lb  PLAN: He may not need long term diuretics. He appears euvolemic. Expect LV EF will gradually improve. DC niacin - causing problems with skin complaints and may contribute to hyperglycemia. DC home today with F/U echo in a few weeks to establish baseline prosthetic gradients.  Sanda Klein, MD, [place] [phone] 10/16/2012, 12:23 PM

## 2012-10-16 NOTE — Progress Notes (Signed)
Joseph Hoffman to be D/C'd Home per MD order.  Discussed with the patient and all questions fully answered.   Blain, Guetter  Home Medication Instructions [id number]   Printed on:10/16/12 1707  Medication Information                    Canagliflozin (INVOKANA) 300 MG TABS Take 1 tablet by mouth every morning.            warfarin (COUMADIN) 10 MG tablet Take 10-15 mg by mouth every evening. 10 mg mon thru sat and 15 mg on sun           glyBURIDE (DIABETA) 5 MG tablet Take 5 mg by mouth daily with breakfast.           insulin glargine (LANTUS) 100 UNIT/ML injection Inject 5-20 Units into the skin 2 (two) times daily. 5-20 units at breakfast and dinner depending on blood sugar levels           escitalopram (LEXAPRO) 10 MG tablet Take 10 mg by mouth every evening.            metFORMIN (GLUCOPHAGE-XR) 500 MG 24 hr tablet Take 2,000 mg by mouth at bedtime.           Liraglutide (VICTOZA) 18 MG/3ML SOLN Inject 18 mg into the skin daily with breakfast.           Fluticasone-Salmeterol (ADVAIR) 250-50 MCG/DOSE AEPB Inhale 1 puff into the lungs 2 (two) times daily as needed. For asthma related symptoms           albuterol (PROVENTIL HFA;VENTOLIN HFA) 108 (90 BASE) MCG/ACT inhaler Inhale 2 puffs into the lungs every 6 (six) hours as needed. For shortness of breath           Chromium 200 MCG CAPS Take 200 mcg by mouth daily.           Glucosamine-Chondroit-Vit C-Mn (GLUCOSAMINE 1500 COMPLEX PO) Take 1,500 mg by mouth daily.           cholecalciferol (VITAMIN D) 1000 UNITS tablet Take 1,000 Units by mouth daily.           Multiple Vitamins-Minerals (CENTRUM SPECIALIST HEART PO) Take 1 tablet by mouth 2 (two) times daily.           Ascorbic Acid (VITAMIN C) 1000 MG tablet Take 1,000 mg by mouth daily.           metoprolol tartrate (LOPRESSOR) 25 MG tablet Take 1 tablet (25 mg total) by mouth 2 (two) times daily.           oxyCODONE (OXY IR/ROXICODONE) 5 MG immediate release  tablet Take 1 tablet (5 mg total) by mouth every 4 (four) hours as needed.           acetaminophen (TYLENOL) 325 MG tablet Take 2 tablets (650 mg total) by mouth every 4 (four) hours as needed for pain or fever.           alum & mag hydroxide-simeth (MAALOX/MYLANTA) 200-200-20 MG/5ML suspension Take 30 mLs by mouth every 6 (six) hours as needed for indigestion.           irbesartan (AVAPRO) 150 MG tablet Take 1 tablet (150 mg total) by mouth daily.           simvastatin (ZOCOR) 20 MG tablet Take 1 tablet (20 mg total) by mouth daily at 6 PM.             VVS,  Skin clean, dry and intact without evidence of skin break down, no evidence of skin tears noted. IV catheter discontinued intact. Site without signs and symptoms of complications. Dressing and pressure applied.  An After Visit Summary was printed and given to the patient. Patient escorted via [place], and D/C home via private auto with wife.  [name] 10/16/2012 5:07 PM

## 2012-10-16 NOTE — Discharge Summary (Signed)
Patient ID: JAVAE HATHCOAT,  MRN: [id number], DOB/AGE: [birth year] 61 y.o.  Admit date: 10/12/2012 Discharge date: 10/16/2012  Primary Care Provider:  Primary Cardiologist: Dr Claiborne Billings  Discharge Diagnoses Principal Problem:  *Acute on chronic systolic heart failure, re-admitted 10/12/12 Active Problems:  Severe aortic stenosis  S/P AVR, 09/29/12, [employer] Jude. 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 He has a history of CAF since the 90's. He had been on chronic Pradaxa. He was referred for complaints of chest pain and DOE. Echo in March showed AS with an AVA of 1.0. Repeat echo in Sept showed progression of his AS and he was admitted in Oct for an elective cath. This confirmed severe AS. He had NL coronaries. He underwent AVR with a St Jude valve on 09/29/12 and was discharged 10/05/12. He was re-admitted 10/12/12 with SOB and was found to be in CHF. He was given Lasix but developed a rash and we assumed it was from the Lasix. Bumex was tried with the same result. He was then put on ethacrynic acid but by this time we determined the rash was actually due to immediate release Niacin that was substituted by the hospital pharmacy for Simcor, which he had been on at home. Since he has normal coronaries we now feel he really doesn't need the Niacin, we'll continue low dose Zocor. Dr Sallyanne Kuster feels he is euvolemic and we can discharge him off diuretics all together. His wgt at discharge is 121 kg, down from 127 on admission. He will need a follow up office visit in one week then an echo and follow up with Dr Claiborne Billings.  Discharge Vitals:  Blood  pressure 139/58, pulse 81, temperature 97.5 F (36.4 C), temperature source Axillary, resp. rate 18, height 5\' 8"  (1.727 m), weight 121.156 kg (267 lb 1.6 oz), SpO2 97.00%.    Labs: Results for orders placed during the hospital encounter of 10/12/12 (from the past 48 hour(s))  GLUCOSE, CAPILLARY     Status: Abnormal   Collection Time   10/14/12  3:50 PM      Component Value Range Comment   Glucose-Capillary 177 (*) 70 - 99 mg/dL    Comment 1 Notify RN     GLUCOSE, CAPILLARY     Status: Abnormal   Collection Time   10/14/12  8:28 PM      Component Value Range Comment   Glucose-Capillary 220 (*) 70 - 99 mg/dL    Comment 1 Notify RN      Comment 2 Documented in Chart     BASIC METABOLIC PANEL     Status: Abnormal   Collection Time   10/15/12  6:05 AM      Component Value Range Comment   Sodium 141  135 - 145 mEq/L    Potassium 4.2  3.5 - 5.1 mEq/L    Chloride 106  96 - 112 mEq/L    CO2 24  19 - 32 mEq/L    Glucose, Bld 207 (*) 70 - 99 mg/dL    BUN 17  6 - 23 mg/dL    Creatinine, Ser 0.86  0.50 -  1.35 mg/dL    Calcium 9.2  8.4 - 10.5 mg/dL    GFR calc non Af Amer >90  >90 mL/min    GFR calc Af Amer >90  >90 mL/min   PROTIME-INR     Status: Abnormal   Collection Time   10/15/12  6:05 AM      Component Value Range Comment   Prothrombin Time 22.4 (*) 11.6 - 15.2 seconds    INR 2.06 (*) 0.00 - 1.49   GLUCOSE, CAPILLARY     Status: Abnormal   Collection Time   10/15/12  6:40 AM      Component Value Range Comment   Glucose-Capillary 202 (*) 70 - 99 mg/dL    Comment 1 Documented in Chart      Comment 2 Notify RN     GLUCOSE, CAPILLARY     Status: Abnormal   Collection Time   10/15/12 10:56 AM      Component Value Range Comment   Glucose-Capillary 180 (*) 70 - 99 mg/dL   GLUCOSE, CAPILLARY     Status: Abnormal   Collection Time   10/15/12  4:19 PM      Component Value Range Comment   Glucose-Capillary 210 (*) 70 - 99 mg/dL   GLUCOSE, CAPILLARY     Status: Abnormal    Collection Time   10/15/12  9:06 PM      Component Value Range Comment   Glucose-Capillary 173 (*) 70 - 99 mg/dL    Comment 1 Notify RN      Comment 2 Documented in Chart     PROTIME-INR     Status: Abnormal   Collection Time   10/16/12  6:20 AM      Component Value Range Comment   Prothrombin Time 24.8 (*) 11.6 - 15.2 seconds    INR 2.37 (*) 0.00 - 1.49   GLUCOSE, CAPILLARY     Status: Abnormal   Collection Time   10/16/12  6:22 AM      Component Value Range Comment   Glucose-Capillary 215 (*) 70 - 99 mg/dL    Comment 1 Notify RN      Comment 2 Documented in Chart       Disposition:  Follow-up Information    Follow up with Troy Sine, MD. (office will call you)    Contact information:   [address] [place] [address] [phone]          Discharge Medications:    Medication List     As of 10/16/2012 12:03 PM    STOP taking these medications         niacin-simvastatin 500-20 MG 24 hr tablet   Commonly known as: SIMCOR      potassium chloride SA 20 MEQ tablet   Commonly known as: K-DUR,KLOR-CON      TAKE these medications         acetaminophen 325 MG tablet   Commonly known as: TYLENOL   Take 2 tablets (650 mg total) by mouth every 4 (four) hours as needed for pain or fever.      albuterol 108 (90 BASE) MCG/ACT inhaler   Commonly known as: PROVENTIL HFA;VENTOLIN HFA   Inhale 2 puffs into the lungs every 6 (six) hours as needed. For shortness of breath      alum & mag hydroxide-simeth 200-200-20 MG/5ML suspension   Commonly known as: MAALOX/MYLANTA   Take 30 mLs by mouth every 6 (six) hours as needed for indigestion.  CENTRUM SPECIALIST HEART PO   Take 1 tablet by mouth 2 (two) times daily.      cholecalciferol 1000 UNITS tablet   Commonly known as: VITAMIN D   Take 1,000 Units by mouth daily.      Chromium 200 MCG Caps   Take 200 mcg by mouth daily.      escitalopram 10 MG tablet   Commonly known as: LEXAPRO   Take  10 mg by mouth every evening.      Fluticasone-Salmeterol 250-50 MCG/DOSE Aepb   Commonly known as: ADVAIR   Inhale 1 puff into the lungs 2 (two) times daily as needed. For asthma related symptoms      GLUCOSAMINE 1500 COMPLEX PO   Take 1,500 mg by mouth daily.      glyBURIDE 5 MG tablet   Commonly known as: DIABETA   Take 5 mg by mouth daily with breakfast.      insulin glargine 100 UNIT/ML injection   Commonly known as: LANTUS   Inject 5-20 Units into the skin 2 (two) times daily. 5-20 units at breakfast and dinner depending on blood sugar levels      INVOKANA 300 MG Tabs   Generic drug: Canagliflozin   Take 1 tablet by mouth every morning.      irbesartan 150 MG tablet   Commonly known as: AVAPRO   Take 1 tablet (150 mg total) by mouth daily.      metFORMIN 500 MG 24 hr tablet   Commonly known as: GLUCOPHAGE-XR   Take 2,000 mg by mouth at bedtime.      metoprolol tartrate 25 MG tablet   Commonly known as: LOPRESSOR   Take 1 tablet (25 mg total) by mouth 2 (two) times daily.      oxyCODONE 5 MG immediate release tablet   Commonly known as: Oxy IR/ROXICODONE   Take 1 tablet (5 mg total) by mouth every 4 (four) hours as needed.      simvastatin 20 MG tablet   Commonly known as: ZOCOR   Take 1 tablet (20 mg total) by mouth daily at 6 PM.      VICTOZA 18 MG/3ML Soln   Generic drug: Liraglutide   Inject 18 mg into the skin daily with breakfast.      vitamin C 1000 MG tablet   Take 1,000 mg by mouth daily.      warfarin 10 MG tablet   Commonly known as: COUMADIN   Take 10-15 mg by mouth every evening. 10 mg mon thru sat and 15 mg on sun         Duration of Discharge Encounter: Greater than 30 minutes including physician time.  Angelena Form PA-C 10/16/2012 12:03 PM

## 2012-10-24 ENCOUNTER — Other Ambulatory Visit: Payer: Self-pay | Admitting: Surgery

## 2012-10-24 ENCOUNTER — Other Ambulatory Visit (HOSPITAL_COMMUNITY): Payer: Self-pay | Admitting: Cardiovascular Disease

## 2012-10-24 DIAGNOSIS — Z952 Presence of prosthetic heart valve: Secondary | ICD-10-CM

## 2012-10-28 ENCOUNTER — Ambulatory Visit (INDEPENDENT_AMBULATORY_CARE_PROVIDER_SITE_OTHER): Payer: Self-pay | Admitting: Surgery

## 2012-10-28 ENCOUNTER — Encounter: Payer: Self-pay | Admitting: Surgery

## 2012-10-28 ENCOUNTER — Ambulatory Visit
Admission: RE | Admit: 2012-10-28 | Discharge: 2012-10-28 | Disposition: A | Discharge status: Home / Self Care | Payer: 59 | Source: Ambulatory Visit | Attending: Surgery | Admitting: Surgery

## 2012-10-28 VITALS — BP 130/79 | HR 100 | Resp 20 | Ht 71.0 in | Wt 268.0 lb

## 2012-10-28 DIAGNOSIS — I359 Nonrheumatic aortic valve disorder, unspecified: Secondary | ICD-10-CM

## 2012-10-28 DIAGNOSIS — Z952 Presence of prosthetic heart valve: Secondary | ICD-10-CM

## 2012-10-28 DIAGNOSIS — Z954 Presence of other heart-valve replacement: Secondary | ICD-10-CM

## 2012-10-28 DIAGNOSIS — I350 Nonrheumatic aortic (valve) stenosis: Secondary | ICD-10-CM | POA: Insufficient documentation

## 2012-10-28 NOTE — Progress Notes (Signed)
[address]            [place] [address]          [phone]      HPI:  Patient returns for routine postoperative follow-up having undergone aortic valve replacement using a 23 mm St. Jude [employer] mechanical valve on 09/29/2012. He has longstanding chronic atrial fibrillation. The patient's early postoperative recovery while in the hospital was notable for an uncomplicated postoperative course. Since hospital discharge the patient reports that he was readmitted about one week after discharge due to progressive shortness of breath and fluid retention. He had been sent home on Lasix but developed a rash and was told to discontinue the Lasix thinking that that may have been causing the rash. It is now thought that the rash may have been due to a generic form of niacin that he was taking while he was in the hospital. He improved with further diuresis and since discharge home has been feeling well. He is walking daily without chest pain or shortness of breath.   Current Outpatient Prescriptions  Medication Sig Dispense Refill  . acetaminophen (TYLENOL) 325 MG tablet Take 2 tablets (650 mg total) by mouth every 4 (four) hours as needed for pain or fever.      [name] albuterol (PROVENTIL HFA;VENTOLIN HFA) 108 (90 BASE) MCG/ACT inhaler Inhale 2 puffs into the lungs every 6 (six) hours as needed. For shortness of breath      . Ascorbic Acid (VITAMIN C) 1000 MG tablet Take 1,000 mg by mouth daily.      . cholecalciferol (VITAMIN D) 1000 UNITS tablet Take 1,000 Units by mouth daily.      . Chromium 200 MCG CAPS Take 200 mcg by mouth daily.      [name] escitalopram (LEXAPRO) 10 MG tablet Take 10 mg by mouth every evening.       . Fluticasone-Salmeterol (ADVAIR) 250-50 MCG/DOSE AEPB Inhale 1 puff into the lungs 2 (two) times daily as needed. For asthma related symptoms      . Glucosamine-Chondroit-Vit C-Mn (GLUCOSAMINE 1500 COMPLEX PO) Take 1,500 mg by mouth daily.      [name]  glyBURIDE (DIABETA) 5 MG tablet Take 5 mg by mouth daily with breakfast.      . hydrochlorothiazide (MICROZIDE) 12.5 MG capsule Take 12.5 mg by mouth daily.      . insulin glargine (LANTUS) 100 UNIT/ML injection Inject 5-20 Units into the skin 2 (two) times daily. 5-20 units at breakfast and dinner depending on blood sugar levels      . irbesartan (AVAPRO) 150 MG tablet Take 1 tablet (150 mg total) by mouth daily.  30 tablet  5  . Liraglutide (VICTOZA [place]) Inject 1.8 mg into the skin 1 day or 1 dose.      . metFORMIN (GLUCOPHAGE-XR) 500 MG 24 hr tablet Take 2,000 mg by mouth at bedtime.      . metoprolol tartrate (LOPRESSOR) 25 MG tablet Take 25 mg by mouth 2 (two) times daily. 1.5 tabs (38 mg) po BID      . Multiple Vitamins-Minerals (CENTRUM SPECIALIST HEART PO) Take 1 tablet by mouth 2 (two) times daily.      . simvastatin (ZOCOR) 20 MG tablet Take 1 tablet (20 mg total) by mouth daily at 6 PM.  30 tablet  5  . warfarin (COUMADIN) 10 MG tablet Take 10-15 mg by mouth  every evening. 10 mg mon thru sat and 15 mg on sun      . [DISCONTINUED] metoprolol tartrate (LOPRESSOR) 25 MG tablet Take 1 tablet (25 mg total) by mouth 2 (two) times daily.  60 tablet  1    Physical Exam: BP 130/79  Pulse 100  Resp 20  Ht 5\' 11"  (1.803 m)  Wt 268 lb (121.564 kg)  BMI 37.38 kg/m2  SpO2 96% He looks well. Cardiac exam shows an irregular rate and rhythm with a crisp mechanical valve click. Lung exam is clear. Chest incision is healing well and the sternum is stable. There is no peripheral edema.  Diagnostic Tests:  *RADIOLOGY REPORT*   Clinical Data: Status post aortic valve replacement   CHEST - 2 VIEW   Comparison: October 12, 2012   Findings: There is persistent patchy atelectatic change in the lung bases. Areas of bibasilar airspace consolidation which were previously present  bilaterally, however, have cleared.  The lungs are otherwise clear.  Heart is mildly enlarged with normal pulmonary  vascularity.  The patient is status post aortic valve replacement.  No adenopathy.  There is degenerative change in the thoracic spine.   IMPRESSION: Patchy bibasilar atelectasis. There has been clearing of airspace consolidation from the bases since prior study.  No new opacity. Stable cardiac prominence.     Original Report Authenticated By: Lowella Grip, M.D.     Impression:  He seems to making a good recovery following aortic valve replacement. He still had significant volume excess at the time of discharge and I think that stopping his diuretic put him into congestive heart failure. Now that the volume excess has been eliminated he feels well. 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  Plan:  He will continue to followup with Dr. Claiborne Billings and will have his INR followed in Dr. Evette Georges office. He will contact me if he develops any problems with his incisions.

## 2012-11-06 ENCOUNTER — Encounter (HOSPITAL_COMMUNITY)
Admission: RE | Admit: 2012-11-06 | Discharge: 2012-11-06 | Disposition: A | Discharge status: Home / Self Care | Payer: 59 | Source: Ambulatory Visit | Attending: Cardiovascular Disease | Admitting: Cardiovascular Disease

## 2012-11-06 DIAGNOSIS — Z954 Presence of other heart-valve replacement: Secondary | ICD-10-CM | POA: Insufficient documentation

## 2012-11-06 DIAGNOSIS — Z5189 Encounter for other specified aftercare: Secondary | ICD-10-CM | POA: Insufficient documentation

## 2012-11-06 DIAGNOSIS — I359 Nonrheumatic aortic valve disorder, unspecified: Secondary | ICD-10-CM | POA: Insufficient documentation

## 2012-11-06 NOTE — Progress Notes (Signed)
Cardiac Rehab Medication Review by a Pharmacist  Does the patient  feel that his/her medications are working for him/her?  yes  Has the patient been experiencing any side effects to the medications prescribed?  no  Does the patient measure his/her own blood pressure or blood glucose at home?  no   Does the patient have any problems obtaining medications due to transportation or finances?   no  Understanding of regimen: excellent Understanding of indications: excellent Potential of compliance: excellent    Pharmacist comments: none    Joseph Hoffman 11/06/2012 10:20 AM

## 2012-11-10 ENCOUNTER — Ambulatory Visit (HOSPITAL_COMMUNITY): Payer: 59

## 2012-11-10 ENCOUNTER — Encounter (HOSPITAL_COMMUNITY)
Admission: RE | Admit: 2012-11-10 | Discharge: 2012-11-10 | Disposition: A | Discharge status: Home / Self Care | Payer: 59 | Source: Ambulatory Visit | Attending: Cardiovascular Disease | Admitting: Cardiovascular Disease

## 2012-11-10 LAB — GLUCOSE, CAPILLARY: Glucose-Capillary: 140 mg/dL — ABNORMAL HIGH (ref 70–99)

## 2012-11-10 NOTE — Progress Notes (Signed)
Pt started cardiac rehab today.  Pt tolerated light exercise without difficulty. Monitor showed afib which is chronic for this patient.  Pvc were noted - pt asymptomatic.  Continue to monitor.

## 2012-11-12 ENCOUNTER — Encounter (HOSPITAL_COMMUNITY): Payer: 59

## 2012-11-12 ENCOUNTER — Ambulatory Visit (HOSPITAL_COMMUNITY): Payer: 59

## 2012-11-14 ENCOUNTER — Encounter (HOSPITAL_COMMUNITY): Payer: 59

## 2012-11-14 ENCOUNTER — Ambulatory Visit (HOSPITAL_COMMUNITY): Payer: 59

## 2012-11-17 ENCOUNTER — Other Ambulatory Visit (HOSPITAL_COMMUNITY): Payer: Self-pay | Admitting: Cardiovascular Disease

## 2012-11-17 ENCOUNTER — Ambulatory Visit (HOSPITAL_COMMUNITY): Payer: 59

## 2012-11-17 ENCOUNTER — Encounter (HOSPITAL_COMMUNITY)
Admission: RE | Admit: 2012-11-17 | Discharge: 2012-11-17 | Disposition: A | Discharge status: Home / Self Care | Payer: 59 | Source: Ambulatory Visit | Attending: Cardiovascular Disease | Admitting: Cardiovascular Disease

## 2012-11-17 DIAGNOSIS — I359 Nonrheumatic aortic valve disorder, unspecified: Secondary | ICD-10-CM

## 2012-11-17 LAB — GLUCOSE, CAPILLARY
Glucose-Capillary: 235 mg/dL — ABNORMAL HIGH (ref 70–99)
Glucose-Capillary: 187 mg/dL — ABNORMAL HIGH (ref 70–99)

## 2012-11-18 ENCOUNTER — Ambulatory Visit (HOSPITAL_COMMUNITY)
Admission: RE | Admit: 2012-11-18 | Discharge: 2012-11-18 | Disposition: A | Discharge status: Home / Self Care | Payer: 59 | Source: Ambulatory Visit | Attending: Cardiovascular Disease | Admitting: Cardiovascular Disease

## 2012-11-18 DIAGNOSIS — I359 Nonrheumatic aortic valve disorder, unspecified: Secondary | ICD-10-CM | POA: Insufficient documentation

## 2012-11-18 DIAGNOSIS — I517 Cardiomegaly: Secondary | ICD-10-CM | POA: Insufficient documentation

## 2012-11-18 DIAGNOSIS — I4891 Unspecified atrial fibrillation: Secondary | ICD-10-CM | POA: Insufficient documentation

## 2012-11-18 DIAGNOSIS — Z954 Presence of other heart-valve replacement: Secondary | ICD-10-CM | POA: Insufficient documentation

## 2012-11-18 NOTE — Progress Notes (Signed)
2D Echo Performed 11/18/2012    Darsh Vandevoort, RCS  

## 2012-11-19 ENCOUNTER — Ambulatory Visit (HOSPITAL_COMMUNITY): Payer: 59

## 2012-11-19 ENCOUNTER — Encounter (HOSPITAL_COMMUNITY): Payer: 59

## 2012-11-21 ENCOUNTER — Ambulatory Visit (HOSPITAL_COMMUNITY): Payer: 59

## 2012-11-21 ENCOUNTER — Encounter (HOSPITAL_COMMUNITY): Payer: 59

## 2012-11-24 ENCOUNTER — Telehealth (HOSPITAL_COMMUNITY): Payer: Self-pay | Admitting: *Deleted

## 2012-11-24 ENCOUNTER — Ambulatory Visit (HOSPITAL_COMMUNITY): Payer: 59

## 2012-11-24 ENCOUNTER — Encounter (HOSPITAL_COMMUNITY): Payer: 59

## 2012-11-24 NOTE — Telephone Encounter (Signed)
Message left on cardiac rehab answering machine - Pt calling out for Monday and Friday (closed on Wednesday -Christmas) he is checking his weight to be sure his breathing issue isn't heart related. Pt said his weight is fine.

## 2012-11-28 ENCOUNTER — Encounter (HOSPITAL_COMMUNITY): Payer: 59

## 2012-11-28 ENCOUNTER — Ambulatory Visit (HOSPITAL_COMMUNITY): Payer: 59

## 2012-12-01 ENCOUNTER — Encounter (HOSPITAL_COMMUNITY): Payer: 59

## 2012-12-01 ENCOUNTER — Ambulatory Visit (HOSPITAL_COMMUNITY): Payer: 59

## 2012-12-03 ENCOUNTER — Ambulatory Visit (HOSPITAL_COMMUNITY): Payer: 59

## 2012-12-05 ENCOUNTER — Ambulatory Visit (HOSPITAL_COMMUNITY): Payer: 59

## 2012-12-05 ENCOUNTER — Encounter (HOSPITAL_COMMUNITY): Payer: 59

## 2012-12-08 ENCOUNTER — Ambulatory Visit (HOSPITAL_COMMUNITY): Payer: 59

## 2012-12-08 ENCOUNTER — Encounter (HOSPITAL_COMMUNITY): Payer: 59

## 2012-12-10 ENCOUNTER — Ambulatory Visit (HOSPITAL_COMMUNITY): Payer: 59

## 2012-12-10 ENCOUNTER — Encounter (HOSPITAL_COMMUNITY): Payer: 59

## 2012-12-12 ENCOUNTER — Ambulatory Visit (HOSPITAL_COMMUNITY): Payer: 59

## 2012-12-12 ENCOUNTER — Encounter (HOSPITAL_COMMUNITY): Payer: 59

## 2012-12-15 ENCOUNTER — Ambulatory Visit (HOSPITAL_COMMUNITY): Payer: 59

## 2012-12-15 ENCOUNTER — Encounter (HOSPITAL_COMMUNITY): Admission: RE | Admit: 2012-12-15 | Payer: 59 | Source: Ambulatory Visit

## 2012-12-17 ENCOUNTER — Encounter (HOSPITAL_COMMUNITY): Payer: 59

## 2012-12-17 ENCOUNTER — Ambulatory Visit (HOSPITAL_COMMUNITY): Payer: 59

## 2012-12-19 ENCOUNTER — Ambulatory Visit (HOSPITAL_COMMUNITY): Payer: 59

## 2012-12-19 ENCOUNTER — Encounter (HOSPITAL_COMMUNITY): Payer: 59

## 2012-12-22 ENCOUNTER — Ambulatory Visit (HOSPITAL_COMMUNITY): Payer: 59

## 2012-12-22 ENCOUNTER — Encounter (HOSPITAL_COMMUNITY): Payer: 59

## 2012-12-24 ENCOUNTER — Encounter (HOSPITAL_COMMUNITY): Payer: 59

## 2012-12-24 ENCOUNTER — Ambulatory Visit (HOSPITAL_COMMUNITY): Payer: 59

## 2012-12-26 ENCOUNTER — Ambulatory Visit (HOSPITAL_COMMUNITY): Payer: 59

## 2012-12-26 ENCOUNTER — Encounter (HOSPITAL_COMMUNITY): Payer: 59

## 2012-12-29 ENCOUNTER — Ambulatory Visit (HOSPITAL_COMMUNITY): Payer: 59

## 2012-12-29 ENCOUNTER — Encounter (HOSPITAL_COMMUNITY): Payer: 59

## 2012-12-31 ENCOUNTER — Encounter (HOSPITAL_COMMUNITY): Payer: 59

## 2012-12-31 ENCOUNTER — Ambulatory Visit (HOSPITAL_COMMUNITY): Payer: 59

## 2012-12-31 ENCOUNTER — Encounter: Payer: Self-pay | Admitting: Pharmacist Clinician (PhC)/ Clinical Pharmacy Specialist

## 2013-01-02 ENCOUNTER — Encounter (HOSPITAL_COMMUNITY): Payer: 59

## 2013-01-02 ENCOUNTER — Ambulatory Visit (HOSPITAL_COMMUNITY): Payer: 59

## 2013-01-05 ENCOUNTER — Ambulatory Visit (HOSPITAL_COMMUNITY): Payer: 59

## 2013-01-05 ENCOUNTER — Encounter (HOSPITAL_COMMUNITY): Payer: 59

## 2013-01-07 ENCOUNTER — Ambulatory Visit (HOSPITAL_COMMUNITY): Payer: 59

## 2013-01-07 ENCOUNTER — Encounter (HOSPITAL_COMMUNITY): Payer: 59

## 2013-01-09 ENCOUNTER — Encounter (HOSPITAL_COMMUNITY): Payer: 59

## 2013-01-09 ENCOUNTER — Ambulatory Visit (HOSPITAL_COMMUNITY): Payer: 59

## 2013-01-12 ENCOUNTER — Ambulatory Visit (HOSPITAL_COMMUNITY): Payer: 59

## 2013-01-12 ENCOUNTER — Encounter (HOSPITAL_COMMUNITY): Payer: 59

## 2013-01-14 ENCOUNTER — Encounter (HOSPITAL_COMMUNITY): Payer: 59

## 2013-01-14 ENCOUNTER — Ambulatory Visit (HOSPITAL_COMMUNITY): Payer: 59

## 2013-01-16 ENCOUNTER — Ambulatory Visit (HOSPITAL_COMMUNITY): Payer: 59

## 2013-01-16 ENCOUNTER — Encounter (HOSPITAL_COMMUNITY): Payer: 59

## 2013-01-19 ENCOUNTER — Encounter (HOSPITAL_COMMUNITY): Payer: 59

## 2013-01-19 ENCOUNTER — Ambulatory Visit (HOSPITAL_COMMUNITY): Payer: 59

## 2013-01-21 ENCOUNTER — Ambulatory Visit (HOSPITAL_COMMUNITY): Payer: 59

## 2013-01-21 ENCOUNTER — Encounter (HOSPITAL_COMMUNITY): Payer: 59

## 2013-01-23 ENCOUNTER — Ambulatory Visit (HOSPITAL_COMMUNITY): Payer: 59

## 2013-01-23 ENCOUNTER — Encounter (HOSPITAL_COMMUNITY): Payer: 59

## 2013-01-26 ENCOUNTER — Ambulatory Visit (HOSPITAL_COMMUNITY): Payer: 59

## 2013-01-26 ENCOUNTER — Encounter (HOSPITAL_COMMUNITY): Payer: 59

## 2013-01-28 ENCOUNTER — Encounter (HOSPITAL_COMMUNITY): Payer: 59

## 2013-01-28 ENCOUNTER — Ambulatory Visit (HOSPITAL_COMMUNITY): Payer: 59

## 2013-01-30 ENCOUNTER — Encounter (HOSPITAL_COMMUNITY): Payer: 59

## 2013-01-30 ENCOUNTER — Ambulatory Visit (HOSPITAL_COMMUNITY): Payer: 59

## 2013-02-02 ENCOUNTER — Ambulatory Visit (HOSPITAL_COMMUNITY): Payer: 59

## 2013-02-02 ENCOUNTER — Encounter (HOSPITAL_COMMUNITY): Payer: 59

## 2013-02-04 ENCOUNTER — Encounter (HOSPITAL_COMMUNITY): Payer: 59

## 2013-02-04 ENCOUNTER — Ambulatory Visit (HOSPITAL_COMMUNITY): Payer: 59

## 2013-02-06 ENCOUNTER — Encounter (HOSPITAL_COMMUNITY): Payer: 59

## 2013-02-06 ENCOUNTER — Ambulatory Visit (HOSPITAL_COMMUNITY): Payer: 59

## 2013-02-09 ENCOUNTER — Encounter (HOSPITAL_COMMUNITY): Payer: 59

## 2013-02-09 ENCOUNTER — Ambulatory Visit (HOSPITAL_COMMUNITY): Payer: 59

## 2013-02-11 ENCOUNTER — Ambulatory Visit (HOSPITAL_COMMUNITY): Payer: 59

## 2013-02-11 ENCOUNTER — Encounter (HOSPITAL_COMMUNITY): Payer: 59

## 2013-02-13 ENCOUNTER — Ambulatory Visit (HOSPITAL_COMMUNITY): Payer: 59

## 2013-02-13 ENCOUNTER — Encounter (HOSPITAL_COMMUNITY): Payer: 59

## 2013-03-02 ENCOUNTER — Encounter: Payer: Self-pay | Admitting: Cardiovascular Disease

## 2013-08-07 ENCOUNTER — Ambulatory Visit: Payer: 59 | Admitting: Cardiovascular Disease

## 2013-08-24 ENCOUNTER — Other Ambulatory Visit: Payer: Self-pay | Admitting: Cardiovascular Disease

## 2013-08-24 LAB — COMPREHENSIVE METABOLIC PANEL
AST: 22 U/L (ref 0–37)
Albumin: 4.2 g/dL (ref 3.5–5.2)
Alkaline Phosphatase: 49 U/L (ref 39–117)
Potassium: 4.8 mEq/L (ref 3.5–5.3)
Sodium: 137 mEq/L (ref 135–145)
Total Bilirubin: 0.6 mg/dL (ref 0.3–1.2)
Total Protein: 6.6 g/dL (ref 6.0–8.3)

## 2013-08-24 LAB — CBC WITH DIFFERENTIAL/PLATELET
Basophils Absolute: 0 10*3/uL (ref 0.0–0.1)
Basophils Relative: 0 % (ref 0–1)
Eosinophils Absolute: 0.3 10*3/uL (ref 0.0–0.7)
Hemoglobin: 13.2 g/dL (ref 13.0–17.0)
MCH: 28 pg (ref 26.0–34.0)
MCHC: 34.6 g/dL (ref 30.0–36.0)
Neutro Abs: 6.8 10*3/uL (ref 1.7–7.7)
Neutrophils Relative %: 72 % (ref 43–77)
Platelets: 189 10*3/uL (ref 150–400)
RDW: 15 % (ref 11.5–15.5)

## 2013-08-24 LAB — LIPID PANEL
LDL Cholesterol: 75 mg/dL (ref 0–99)
Triglycerides: 192 mg/dL — ABNORMAL HIGH (ref <150)
VLDL: 38 mg/dL (ref 0–40)

## 2013-08-24 LAB — HEMOGLOBIN A1C: Mean Plasma Glucose: 186 mg/dL — ABNORMAL HIGH (ref <117)

## 2013-08-24 LAB — TSH: TSH: 1.043 u[IU]/mL (ref 0.350–4.500)

## 2013-08-25 LAB — DIGOXIN LEVEL: Digoxin Level: 0.3 ng/mL — ABNORMAL LOW (ref 0.8–2.0)

## 2013-08-28 NOTE — Progress Notes (Signed)
Quick Note:  Patient has appointment on Monday and will discuss @ that time. ______

## 2013-09-01 ENCOUNTER — Ambulatory Visit: Payer: 59 | Admitting: Cardiovascular Disease

## 2013-09-01 ENCOUNTER — Other Ambulatory Visit: Payer: Self-pay | Admitting: *Deleted

## 2013-09-01 MED ORDER — SIMVASTATIN 40 MG PO TABS: 40.0000 mg | ORAL_TABLET | Freq: Every day | ORAL | Status: DC

## 2013-09-01 NOTE — Progress Notes (Signed)
Quick Note:  Called patient and discussed lab results since he rescheduled today's appointment. Simvastatin prescription sent to patient's mail order pharmacy. ______

## 2013-09-10 ENCOUNTER — Encounter: Payer: Self-pay | Admitting: Cardiovascular Disease

## 2013-09-10 ENCOUNTER — Ambulatory Visit (INDEPENDENT_AMBULATORY_CARE_PROVIDER_SITE_OTHER): Payer: 59 | Admitting: Cardiovascular Disease

## 2013-09-10 VITALS — BP 130/70 | HR 76 | Ht 71.0 in | Wt 287.6 lb

## 2013-09-10 DIAGNOSIS — I428 Other cardiomyopathies: Secondary | ICD-10-CM

## 2013-09-10 DIAGNOSIS — E119 Type 2 diabetes mellitus without complications: Secondary | ICD-10-CM

## 2013-09-10 DIAGNOSIS — G4730 Sleep apnea, unspecified: Secondary | ICD-10-CM

## 2013-09-10 DIAGNOSIS — Z954 Presence of other heart-valve replacement: Secondary | ICD-10-CM

## 2013-09-10 DIAGNOSIS — I4821 Permanent atrial fibrillation: Secondary | ICD-10-CM

## 2013-09-10 DIAGNOSIS — E785 Hyperlipidemia, unspecified: Secondary | ICD-10-CM

## 2013-09-10 DIAGNOSIS — Z7901 Long term (current) use of anticoagulants: Secondary | ICD-10-CM

## 2013-09-10 DIAGNOSIS — I4891 Unspecified atrial fibrillation: Secondary | ICD-10-CM

## 2013-09-10 DIAGNOSIS — Z952 Presence of prosthetic heart valve: Secondary | ICD-10-CM

## 2013-09-10 DIAGNOSIS — I2510 Atherosclerotic heart disease of native coronary artery without angina pectoris: Secondary | ICD-10-CM

## 2013-09-10 MED ORDER — METOPROLOL TARTRATE 25 MG PO TABS: 25.0000 mg | ORAL_TABLET | Freq: Two times a day (BID) | ORAL | Status: DC

## 2013-09-10 NOTE — Progress Notes (Addendum)
Patient ID: Joseph Hoffman, male   DOB: 19-Jun-1951, 62 y.o.   MRN: [id number]        HPI: Joseph Hoffman, is a 62 y.o. male  who presents for a six-month followup cardiology evaluation.  Mr. belly has a long-standing history of permanent atrial fibrillation dating back to 50. He has a history of type 2 diabetes mellitus. He developed progressive aortic valve stenosis and 09/29/2012 underwent St. Jude aortic valve replacement by [name]. Additional problems also include hypertension, obstructive sleep apnea on CPAP and he also developed some transient CHF symptoms after his Lasix had been held following his surgery. He saw Dr. Wilson Singer for his diabetes. Recently, he had held his Lasix for a couple of days trying to figure out if this was contributing to his effect on his new diabetic medicine. After several days of not taking the Lasix again started to notice some leg swelling and shortness of breath which resolved with reinstitution of Lasix treatment. He denies recent chest pain. He denies presyncope or syncope. He presents for evaluation. He does note that he is able to have much more activity now since his valve has been replaced probably one year ago. He recently obtained a new CPAP machine. He uses this with 100% compliance. He denies residual daytime sleepiness. His sleep is restorative. He is unaware of breakthrough snoring.  Past Medical History  Diagnosis Date  . Aortic stenosis   . Myocardial infarction   . Dysrhythmia     a fib  . Anxiety   . Asthma   . Sleep apnea     uses CPAP  . Diabetes mellitus without complication   . Heart murmur   . Arthritis   . Hepatitis 2003  . S/P AVR (aortic valve replacement), 09/29/12 09/30/2012  . Permanent atrial fibrillation, since 1994 09/30/2012    stress test 02/08/12- normal study, no significant ischemia  . DM (diabetes mellitus) 09/30/2012  . Asymptomatic LV dysfunction, EF 45%, normal coronary arteries on cardiac cath 09/18/12  09/30/2012  . Chronic anticoagulation, on Xarelto prior to admit 09/30/2012  . Hypertension   . CHF (congestive heart failure)   . Shortness of breath     Past Surgical History  Procedure Laterality Date  . Cholecystectomy    . Fracture surgery    . Cardiac catheterization  09/18/12    severe calcific aortic stenosis, peak gradient 81mm, mean gradient 63mm, EF 45%  . Cardiac valve replacement      AVR 09-29-12  . Aortic valve replacement  09/29/2012    Procedure: AORTIC VALVE REPLACEMENT (AVR);  Surgeon: Gaye Pollack, MD;  Location: [facility];  Service: Open Heart Surgery;  Laterality: N/A;    Allergies  Allergen Reactions  . Iohexol Anaphylaxis  . Niacin And Related     Flushing with immediate realese  . Penicillins Other (See Comments)    Unknown.[name]  . Lasix [Furosemide] Rash    Current Outpatient Prescriptions  Medication Sig Dispense Refill  . acetaminophen (TYLENOL) 325 MG tablet Take 2 tablets (650 mg total) by mouth every 4 (four) hours as needed for pain or fever.      [name] albuterol (PROVENTIL HFA;VENTOLIN HFA) 108 (90 BASE) MCG/ACT inhaler Inhale 2 puffs into the lungs every 6 (six) hours as needed. For shortness of breath      . Ascorbic Acid (VITAMIN C) 1000 MG tablet Take 1,000 mg by mouth daily.      . cholecalciferol (VITAMIN D) 1000  UNITS tablet Take 1,000 Units by mouth daily.      . Chromium 200 MCG CAPS Take 200 mcg by mouth daily.      . digoxin (LANOXIN) 0.125 MG tablet Take 0.125 mg by mouth daily.      [name] escitalopram (LEXAPRO) 10 MG tablet Take 10 mg by mouth every evening.       . fish oil-omega-3 fatty acids 1000 MG capsule Take 2 g by mouth daily.      . Fluticasone-Salmeterol (ADVAIR) 250-50 MCG/DOSE AEPB Inhale 1 puff into the lungs daily. For asthma related symptoms      . furosemide (LASIX) 40 MG tablet Take 40 mg by mouth daily.      . Glucosamine-Chondroit-Vit C-Mn (GLUCOSAMINE 1500 COMPLEX PO) Take 1,500 mg by mouth daily.       [name] glyBURIDE (DIABETA) 5 MG tablet Take 5 mg by mouth daily with breakfast.      . insulin glargine (LANTUS) 100 UNIT/ML injection Inject 5-20 Units into the skin 2 (two) times daily. 5-20 units at breakfast and dinner depending on blood sugar levels      . irbesartan (AVAPRO) 150 MG tablet Take 300 mg by mouth daily.      . Liraglutide (VICTOZA [place]) Inject 1.8 mg into the skin 1 day or 1 dose.      . metFORMIN (GLUCOPHAGE-XR) 500 MG 24 hr tablet Take 2,000 mg by mouth at bedtime.      . metoprolol tartrate (LOPRESSOR) 25 MG tablet Take 1 tablet (25 mg total) by mouth 2 (two) times daily.  180 tablet  3  . Multiple Vitamins-Minerals (CENTRUM SPECIALIST HEART PO) Take 1 tablet by mouth 2 (two) times daily.      . Potassium Gluconate 550 MG TABS Take 1 tablet by mouth daily.      . simvastatin (ZOCOR) 40 MG tablet Take 1 tablet (40 mg total) by mouth at bedtime.  90 tablet  3  . warfarin (COUMADIN) 10 MG tablet Take 10-15 mg by mouth every evening. 10 mg mon thru sat and 15 mg on sun       No current facility-administered medications for this visit.    History   Social History  . Marital Status: Married    Spouse Name: N/A    Number of Children: N/A  . Years of Education: N/A   Occupational History  . Not on file.   Social History Main Topics  . Smoking status: Former Smoker -- 0.25 packs/day for 10 years    Types: Cigarettes    Quit date: 09/25/1982  . Smokeless tobacco: Never Used  . Alcohol Use: No     Comment: social  . Drug Use: No  . Sexual Activity: Yes    Birth Control/ Protection: None   Other Topics Concern  . Not on file   Social History Narrative  . No narrative on file    Family History  Problem Relation Age of Onset  . Hypertension Mother   . Diabetes Father   . Hyperlipidemia Brother 64    stents placed  . Heart attack Brother 15    ROS is negative for fevers, chills or night sweats. He denies skin rash. He denies wheezing. He denies presyncope or  syncope. He denies chest pressure. There is no angina. He denies significant dyspeptic symptoms. He states his blood pressure has been well-controlled. He denies abdominal pain. He denies change in bowel or bladder habits. He denies hematuria. There is no bleeding. He  does note some mild leg swelling intermittently. His neurologic symptoms.  Other comprehensive 12 point system review is negative.  PE BP 130/70  Pulse 76  Ht 5\' 11"  (1.803 m)  Wt 287 lb 9.6 oz (130.455 kg)  BMI 40.13 kg/m2  Repeat blood pressure 114/70 General: Alert, oriented, no distress.  Skin: normal turgor, no rashes HEENT: Normocephalic, atraumatic. Pupils round and reactive; sclera anicteric;no lid lag.  Nose without nasal septal hypertrophy Mouth/Parynx benign; Mallinpatti scale 3 Neck: No JVD, no carotid briuts Lungs: clear to ausculatation and percussion; no wheezing or rales Heart: Irregularly irregular rhythm compatible with his atrial fibrillation with a controlled ventricular response in the 70s;, s1 s2 normal 2/6 systolic murmur concordant with his St. Jude aortic valve with crisp clicks; no S3. Abdomen: soft, nontender; no hepatosplenomehaly, BS+; abdominal aorta nontender and not dilated by palpation. Pulses 2+ Extremities: no clubbing cyanosis or edema, Homan's sign negative  Neurologic: grossly nonfocal  ECG: Atrial fibrillation with ventricular rate in the 70s. Nonspecific interventricular conduction delay. Right superior axis deviation.  LABS:  BMET    Component Value Date/Time   NA 137 08/24/2013 1023   K 4.8 08/24/2013 1023   CL 103 08/24/2013 1023   CO2 27 08/24/2013 1023   GLUCOSE 146* 08/24/2013 1023   BUN 21 08/24/2013 1023   CREATININE 1.13 08/24/2013 1023   CREATININE 0.86 10/15/2012 0605   CALCIUM 9.5 08/24/2013 1023   GFRNONAA >90 10/15/2012 0605   GFRAA >90 10/15/2012 0605     Hepatic Function Panel     Component Value Date/Time   PROT 6.6 08/24/2013 1023   ALBUMIN 4.2 08/24/2013  1023   AST 22 08/24/2013 1023   ALT 27 08/24/2013 1023   ALKPHOS 49 08/24/2013 1023   BILITOT 0.6 08/24/2013 1023     CBC    Component Value Date/Time   WBC 9.5 08/24/2013 1023   RBC 4.71 08/24/2013 1023   HGB 13.2 08/24/2013 1023   HCT 38.1* 08/24/2013 1023   PLT 189 08/24/2013 1023   MCV 80.9 08/24/2013 1023   MCH 28.0 08/24/2013 1023   MCHC 34.6 08/24/2013 1023   RDW 15.0 08/24/2013 1023   LYMPHSABS 1.6 08/24/2013 1023   MONOABS 0.8 08/24/2013 1023   EOSABS 0.3 08/24/2013 1023   BASOSABS 0.0 08/24/2013 1023     BNP    Component Value Date/Time   PROBNP 2257.0* 10/12/2012 1400    Lipid Panel     Component Value Date/Time   CHOL 144 08/24/2013 1023   TRIG 192* 08/24/2013 1023   HDL 31* 08/24/2013 1023   CHOLHDL 4.6 08/24/2013 1023   VLDL 38 08/24/2013 1023   LDLCALC 75 08/24/2013 1023     RADIOLOGY: No results found.    ASSESSMENT AND PLAN: Mr. Margaretmary Dys is now one year status post aortic valve replacement for severe aortic stenosis. He has permanent atrial fibrillation and is on Coumadin anticoagulation with therapeutic anticoagulation. I did review laboratory from 08/24/2013. Hemoglobin 13.2 hematocrit 38.1. Glucose was increased at 146. Renal function was stable. Total cholesterol is 141 triglycerides are still elevated at 192 HDL 31 consistent with an atherogenic pattern. LDL cholesterol 75. At that time, single A1c was 8.1. We discussed additional weight loss. We discussed improved glucose control. 3 months, I am recommending that he obtain an MR lipoprotein of for continued optimal therapy of his lipid status. He is utilizing CPAP with excellent compliance. His blood pressure is controlled. I will see him in 6 months for cardiology  reevaluation.     Troy Sine, MD, [facility]  09/12/2013 11:38 AM

## 2013-09-10 NOTE — Patient Instructions (Addendum)
Your physician recommends that you return for lab work fasting IN 3 months  Your physician recommends that you schedule a follow-up appointment in: 6 MONTHS

## 2013-09-11 ENCOUNTER — Encounter: Payer: Self-pay | Admitting: Cardiovascular Disease

## 2013-09-12 ENCOUNTER — Encounter: Payer: Self-pay | Admitting: Cardiovascular Disease

## 2013-09-17 ENCOUNTER — Encounter: Payer: Self-pay | Admitting: Cardiovascular Disease

## 2013-10-08 ENCOUNTER — Other Ambulatory Visit: Payer: Self-pay

## 2013-10-14 ENCOUNTER — Other Ambulatory Visit: Payer: Self-pay | Admitting: Endocrinology

## 2013-10-14 DIAGNOSIS — R42 Dizziness and giddiness: Secondary | ICD-10-CM

## 2013-10-14 DIAGNOSIS — R270 Ataxia, unspecified: Secondary | ICD-10-CM

## 2013-10-24 ENCOUNTER — Other Ambulatory Visit: Payer: 59

## 2013-11-05 ENCOUNTER — Telehealth: Payer: Self-pay | Admitting: *Deleted

## 2013-11-05 NOTE — Telephone Encounter (Signed)
Called patient on home phone and cell phone. Left a message stating that his appointment time was changed from 1230 for a 1pm to 230 for a 3pm per Dr. Janann Colonel. Physician has a training session at 1.

## 2013-11-06 ENCOUNTER — Encounter: Payer: Self-pay | Admitting: Neurology

## 2013-11-06 ENCOUNTER — Ambulatory Visit (INDEPENDENT_AMBULATORY_CARE_PROVIDER_SITE_OTHER): Payer: 59 | Admitting: Neurology

## 2013-11-06 VITALS — BP 125/64 | HR 69 | Wt 297.0 lb

## 2013-11-06 DIAGNOSIS — G459 Transient cerebral ischemic attack, unspecified: Secondary | ICD-10-CM

## 2013-11-06 DIAGNOSIS — F09 Unspecified mental disorder due to known physiological condition: Secondary | ICD-10-CM

## 2013-11-06 DIAGNOSIS — R4189 Other symptoms and signs involving cognitive functions and awareness: Secondary | ICD-10-CM

## 2013-11-06 NOTE — Progress Notes (Addendum)
[facility]    Provider:  Dr Janann Colonel Referring Provider: Anda Kraft, MD Primary Care Physician:  Dwan Bolt, MD  CC:  Dizziness   HPI:  Joseph Hoffman is a 62 y.o. male here as a referral from Dr. Wilson Singer for dizziness  62 year old gentleman with history of hypertension, hyperlipidemia, diabetes, atrial fibrillation, aortic stenosis status post mechanical heart valve presenting for initial evaluation of episodes of disequilibrium. Patient notes episodes around one month ago, he woke up and got out of bed, walking to the bathroom felt off balance fell over, took a few seconds and was able to get back up. He felt his equilibrium was off. Denies any episodes of vertigo or sensation of room spinning. He notes since then constantly feeling lightheaded and off balance when standing. No blacking out episodes. He notes a these episodes are not triggered by change in head position, only triggered by standing up from a sitting or lying down position. It will improve when he sits back down. He does note intermittent episodes where he feels his vision crosses. No blurry vision no loss of vision. No focal motor or sensory changes, no language changes during these episodes. No hearing loss or change in hearing, he does have a sensation of pressure fullness in his left ear. He recently had his Lopressor to 25 mg twice a day to 12.5 mg twice a day, he feels this has slowly started to improve his symptoms.  Also having trouble with short term memory, they feel this was tied to an increase in his zocor. They decreased it from 40mg  to 20mg , this has improved his memory.   History of DM, HTN, HLD. No known history of stroke, TIA.   No history of EtOH use.   Had recent head CT done, imaging review, was unremarkable. Patient unable to have MRI due to to mechanical heart valve.  Review of Systems: Out of a complete 14 system review, the patient complains of only the following symptoms,  and all other reviewed systems are negative. Positive fatigue memory loss confusion numbness dizziness joint pain spinning sensation which sleep decreased energy allergies joint pain  History   Social History  . Marital Status: Married    Spouse Name: [name]    Number of Children: 3  . Years of Education: 15+   Occupational History  .  [employer]   Social History Main Topics  . Smoking status: Former Smoker -- 0.25 packs/day for 10 years    Types: Cigarettes    Quit date: 09/25/1982  . Smokeless tobacco: Former Systems developer  . Alcohol Use: Yes     Comment: occ. beer/wine  . Drug Use: No  . Sexual Activity: Yes    Birth Control/ Protection: None   Other Topics Concern  . Not on file   Social History Narrative   Patient is married [name]) and lives with his wife and son.   Patient has three children.   Patient is working full-time.   Patient has a college education.   Patient is right handed.   Patient drinks about 2-3 cups of coffee daily.    Family History  Problem Relation Age of Onset  . Hypertension Mother   . Diabetes Father   . Hyperlipidemia Brother 17    stents placed  . Heart attack Brother 39    Past Medical History  Diagnosis Date  . Aortic stenosis   . Myocardial infarction   . Dysrhythmia     a fib  .  Anxiety   . Asthma   . Sleep apnea     uses CPAP  . Diabetes mellitus without complication   . Heart murmur   . Arthritis   . Hepatitis 2003  . S/P AVR (aortic valve replacement), 09/29/12 09/30/2012  . Permanent atrial fibrillation, since 1994 09/30/2012    stress test 02/08/12- normal study, no significant ischemia  . DM (diabetes mellitus) 09/30/2012  . Asymptomatic LV dysfunction, EF 45%, normal coronary arteries on cardiac cath 09/18/12 09/30/2012  . Chronic anticoagulation, on Xarelto prior to admit 09/30/2012  . Hypertension   . CHF (congestive heart failure)   . Shortness of breath     Past Surgical History  Procedure Laterality Date  .  Cholecystectomy    . Fracture surgery    . Cardiac catheterization  09/18/12    severe calcific aortic stenosis, peak gradient 72mm, mean gradient 64mm, EF 45%  . Cardiac valve replacement      AVR 09-29-12  . Aortic valve replacement  09/29/2012    Procedure: AORTIC VALVE REPLACEMENT (AVR);  Surgeon: Gaye Pollack, MD;  Location: [facility];  Service: Open Heart Surgery;  Laterality: N/A;    Current Outpatient Prescriptions  Medication Sig Dispense Refill  . acetaminophen (TYLENOL) 325 MG tablet Take 2 tablets (650 mg total) by mouth every 4 (four) hours as needed for pain or fever.      [name] albuterol (PROVENTIL HFA;VENTOLIN HFA) 108 (90 BASE) MCG/ACT inhaler Inhale 2 puffs into the lungs every 6 (six) hours as needed. For shortness of breath      . Ascorbic Acid (VITAMIN C) 1000 MG tablet Take 1,000 mg by mouth daily.      . B-D ULTRAFINE III SHORT PEN 31G X 8 MM MISC       . Blood Glucose Monitoring Suppl (BAYER CONTOUR MONITOR) W/DEVICE KIT       . cholecalciferol (VITAMIN D) 1000 UNITS tablet Take 1,000 Units by mouth daily.      . Chromium 200 MCG CAPS Take 200 mcg by mouth daily.      . digoxin (LANOXIN) 0.125 MG tablet Take 0.125 mg by mouth daily.      [name] EASY TOUCH LANCETS 30G/TWIST MISC       . escitalopram (LEXAPRO) 10 MG tablet Take 10 mg by mouth every evening.       . fish oil-omega-3 fatty acids 1000 MG capsule Take 2 g by mouth daily.      . Fluticasone-Salmeterol (ADVAIR) 250-50 MCG/DOSE AEPB Inhale 1 puff into the lungs daily. For asthma related symptoms      . furosemide (LASIX) 40 MG tablet Take 40 mg by mouth daily.      . Glucosamine-Chondroit-Vit C-Mn (GLUCOSAMINE 1500 COMPLEX PO) Take 1,500 mg by mouth daily.      [name] glyBURIDE (DIABETA) 5 MG tablet Take 5 mg by mouth daily with breakfast.      . insulin glargine (LANTUS) 100 UNIT/ML injection Inject 5-20 Units into the skin 2 (two) times daily. 5-20 units at breakfast and dinner depending on blood sugar levels      .  irbesartan (AVAPRO) 150 MG tablet Take 300 mg by mouth daily.      . Liraglutide (VICTOZA [place]) Inject 1.8 mg into the skin 1 day or 1 dose.      . metFORMIN (GLUCOPHAGE-XR) 500 MG 24 hr tablet Take 2,000 mg by mouth at bedtime.      . metoprolol tartrate (LOPRESSOR) 25 MG tablet Take  1 tablet (25 mg total) by mouth 2 (two) times daily.  180 tablet  3  . Multiple Vitamins-Minerals (CENTRUM SPECIALIST HEART PO) Take 1 tablet by mouth 2 (two) times daily.      . Potassium Gluconate 550 MG TABS Take 1 tablet by mouth daily.      . simvastatin (ZOCOR) 40 MG tablet Take 1 tablet (40 mg total) by mouth at bedtime.  90 tablet  3  . warfarin (COUMADIN) 10 MG tablet Take 10-15 mg by mouth every evening. 10 mg on Tues, Thurs, Sat and Sun and 12.5 mg on Mon, Wed, Frid       No current facility-administered medications for this visit.    Allergies as of 11/06/2013 - Review Complete 09/12/2013  Allergen Reaction Noted  . Iohexol Anaphylaxis 08/27/2008  . Niacin and related  10/16/2012  . Penicillins Other (See Comments) 09/18/2012  . Lasix [furosemide] Rash 10/12/2012    Vitals: BP 125/64  Pulse 69  Wt 297 lb (134.718 kg) Last Weight:  Wt Readings from Last 1 Encounters:  11/06/13 297 lb (134.718 kg)   Last Height:   Ht Readings from Last 1 Encounters:  09/10/13 5\' 11"  (1.803 m)   Manual BP prone 138/78 P 78 irregular BP standing 136/76 P 80irregular  Physical exam: Exam: Gen: NAD, conversant Eyes: anicteric sclerae, moist conjunctivae HENT: Atraumatic, oropharynx clear Neck: Trachea midline; supple,  Lungs: CTA, no wheezing, rales, rhonic                          CV: irregular, no MRG Abdomen: Soft, non-tender;  Extremities: No peripheral edema  Skin: Normal temperature, no rash,  Psych: Appropriate affect, pleasant  Neuro: MS: AA&Ox3, appropriately interactive, normal affect   Speech: fluent w/o paraphasic error  Memory: good recent and remote recall  CN: PERRL, EOMI no  nystagmus, no ptosis, sensation intact to LT V1-V3 bilat, face symmetric, no weakness, hearing grossly intact, palate elevates symmetrically, shoulder shrug 5/5 bilat,  tongue protrudes midline, no fasiculations noted.  Motor: normal bulk and tone Strength: 5/5  In all extremities  Coord: rapid alternating and point-to-point (FNF, HTS) movements intact.  Reflexes: symmetrical, bilat downgoing toes  Sens: LT intact in all extremities  Gait: Narrow based, posture, stance, stride and arm-swing normal. Unsteady with toe walking, wobbles with tandem gait but does not fall, wobbles with Romberg with eyes closed but with eyes open does not fall   Assessment:  After physical and neurologic examination, review of laboratory studies, imaging, neurophysiology testing and pre-existing records, assessment will be reviewed on the problem list.  Plan:  Treatment plan and additional workup will be reviewed under Problem List.  1)Dizziness 2)Cognitive decline  62y/o presented for initial evaluation of episodes of dizziness and deep instability. The symptoms began acutely around one month ago and occur predominantly when going from a sitting prone to standing position. Based on acute onset would've concern for ischemic process. Head CT was unremarkable, but difficult to fully rule out posterior circulation stroke based on head CT. Otherwise unremarkable physical exam though does make stroke less likely at this point. Will check carotid ultrasound and transcranial Doppler for possible vascular stenosis. Will check lab work for possible metabolic causes. Consultation that he should followup with cardiology. Symptoms are not fully consistent with vertigo but would consider ENT evaluation in the future as patient notes sensation of fullness in left ear and questionable vertigo symptoms. Cough once workup completed.  Jim Like, DO  [facility] [address] [place], [place]  [address]  Phone [phone] Fax [phone]

## 2013-11-06 NOTE — Patient Instructions (Signed)
Overall you are doing fairly well but I do want to suggest a few things today:   Remember to drink plenty of fluid, eat healthy meals and do not skip any meals. Try to eat protein with a every meal and eat a healthy snack such as fruit or nuts in between meals. Try to keep a regular sleep-wake schedule and try to exercise daily, particularly in the form of walking, 20-30 minutes a day, if you can.   We will check some blood work for possible causes of your memory decline  We will check a carotid ultrasound and transcranial doppler to check for possible vascular causes of your symptoms  Please follow up with cardiology  Follow up once workup completed. Please call us with any interim questions, concerns, problems, updates or refill requests.   My clinical assistant and will answer any of your questions and relay your messages to me and also relay most of my messages to you.   Our phone number is [phone]. We also have an after hours call service for urgent matters and there is a physician on-call for urgent questions. For any emergencies you know to call 911 or go to the nearest emergency room

## 2013-11-12 LAB — METHYLMALONIC ACID, SERUM: Methylmalonic Acid: 248 nmol/L (ref 0–378)

## 2013-11-12 LAB — VITAMIN B12: Vitamin B-12: 1524 pg/mL — ABNORMAL HIGH (ref 211–946)

## 2013-11-18 ENCOUNTER — Other Ambulatory Visit: Payer: Self-pay | Admitting: Cardiovascular Disease

## 2013-11-24 ENCOUNTER — Ambulatory Visit (INDEPENDENT_AMBULATORY_CARE_PROVIDER_SITE_OTHER): Payer: 59

## 2013-11-24 DIAGNOSIS — G459 Transient cerebral ischemic attack, unspecified: Secondary | ICD-10-CM

## 2013-11-24 DIAGNOSIS — R4189 Other symptoms and signs involving cognitive functions and awareness: Secondary | ICD-10-CM

## 2013-11-24 DIAGNOSIS — I669 Occlusion and stenosis of unspecified cerebral artery: Secondary | ICD-10-CM

## 2013-11-24 DIAGNOSIS — Z0289 Encounter for other administrative examinations: Secondary | ICD-10-CM

## 2014-03-14 ENCOUNTER — Other Ambulatory Visit: Payer: Self-pay | Admitting: Cardiovascular Disease

## 2014-03-15 NOTE — Telephone Encounter (Signed)
Rx was sent to pharmacy electronically. 

## 2014-03-16 ENCOUNTER — Other Ambulatory Visit: Payer: Self-pay | Admitting: Cardiovascular Disease

## 2014-03-16 NOTE — Telephone Encounter (Signed)
Rx refill sent to patients pharmacy  

## 2014-04-12 DIAGNOSIS — I4891 Unspecified atrial fibrillation: Secondary | ICD-10-CM | POA: Diagnosis not present

## 2014-04-12 DIAGNOSIS — I10 Essential (primary) hypertension: Secondary | ICD-10-CM | POA: Diagnosis not present

## 2014-04-12 DIAGNOSIS — Z7901 Long term (current) use of anticoagulants: Secondary | ICD-10-CM | POA: Diagnosis not present

## 2014-04-21 DIAGNOSIS — Z7901 Long term (current) use of anticoagulants: Secondary | ICD-10-CM | POA: Diagnosis not present

## 2014-05-13 DIAGNOSIS — E119 Type 2 diabetes mellitus without complications: Secondary | ICD-10-CM | POA: Diagnosis not present

## 2014-05-13 DIAGNOSIS — E789 Disorder of lipoprotein metabolism, unspecified: Secondary | ICD-10-CM | POA: Diagnosis not present

## 2014-05-13 DIAGNOSIS — Z954 Presence of other heart-valve replacement: Secondary | ICD-10-CM | POA: Diagnosis not present

## 2014-05-13 DIAGNOSIS — I10 Essential (primary) hypertension: Secondary | ICD-10-CM | POA: Diagnosis not present

## 2014-05-21 DIAGNOSIS — R0609 Other forms of dyspnea: Secondary | ICD-10-CM | POA: Diagnosis not present

## 2014-05-21 DIAGNOSIS — E119 Type 2 diabetes mellitus without complications: Secondary | ICD-10-CM | POA: Diagnosis not present

## 2014-05-21 DIAGNOSIS — L0390 Cellulitis, unspecified: Secondary | ICD-10-CM | POA: Diagnosis not present

## 2014-05-21 DIAGNOSIS — L0291 Cutaneous abscess, unspecified: Secondary | ICD-10-CM | POA: Diagnosis not present

## 2014-06-08 DIAGNOSIS — I509 Heart failure, unspecified: Secondary | ICD-10-CM | POA: Diagnosis not present

## 2014-06-08 DIAGNOSIS — I4891 Unspecified atrial fibrillation: Secondary | ICD-10-CM | POA: Diagnosis not present

## 2014-06-08 DIAGNOSIS — R0609 Other forms of dyspnea: Secondary | ICD-10-CM | POA: Diagnosis not present

## 2014-06-08 DIAGNOSIS — J984 Other disorders of lung: Secondary | ICD-10-CM | POA: Diagnosis not present

## 2014-06-08 DIAGNOSIS — Z79899 Other long term (current) drug therapy: Secondary | ICD-10-CM | POA: Diagnosis not present

## 2014-06-08 DIAGNOSIS — Z7901 Long term (current) use of anticoagulants: Secondary | ICD-10-CM | POA: Diagnosis not present

## 2014-06-08 DIAGNOSIS — E119 Type 2 diabetes mellitus without complications: Secondary | ICD-10-CM | POA: Diagnosis not present

## 2014-06-08 DIAGNOSIS — I10 Essential (primary) hypertension: Secondary | ICD-10-CM | POA: Diagnosis not present

## 2014-06-08 DIAGNOSIS — Z125 Encounter for screening for malignant neoplasm of prostate: Secondary | ICD-10-CM | POA: Diagnosis not present

## 2014-06-08 DIAGNOSIS — R0989 Other specified symptoms and signs involving the circulatory and respiratory systems: Secondary | ICD-10-CM | POA: Diagnosis not present

## 2014-06-15 DIAGNOSIS — G4730 Sleep apnea, unspecified: Secondary | ICD-10-CM | POA: Diagnosis not present

## 2014-06-15 DIAGNOSIS — E119 Type 2 diabetes mellitus without complications: Secondary | ICD-10-CM | POA: Diagnosis not present

## 2014-06-15 DIAGNOSIS — I4891 Unspecified atrial fibrillation: Secondary | ICD-10-CM | POA: Diagnosis not present

## 2014-06-21 DIAGNOSIS — I4891 Unspecified atrial fibrillation: Secondary | ICD-10-CM | POA: Diagnosis not present

## 2014-06-21 DIAGNOSIS — Z7901 Long term (current) use of anticoagulants: Secondary | ICD-10-CM | POA: Diagnosis not present

## 2014-07-26 DIAGNOSIS — Z7901 Long term (current) use of anticoagulants: Secondary | ICD-10-CM | POA: Diagnosis not present

## 2014-07-26 DIAGNOSIS — I4891 Unspecified atrial fibrillation: Secondary | ICD-10-CM | POA: Diagnosis not present

## 2014-08-10 DIAGNOSIS — E119 Type 2 diabetes mellitus without complications: Secondary | ICD-10-CM | POA: Diagnosis not present

## 2014-08-16 DIAGNOSIS — R0609 Other forms of dyspnea: Secondary | ICD-10-CM | POA: Diagnosis not present

## 2014-08-16 DIAGNOSIS — E119 Type 2 diabetes mellitus without complications: Secondary | ICD-10-CM | POA: Diagnosis not present

## 2014-08-16 DIAGNOSIS — R011 Cardiac murmur, unspecified: Secondary | ICD-10-CM | POA: Diagnosis not present

## 2014-08-16 DIAGNOSIS — Z23 Encounter for immunization: Secondary | ICD-10-CM | POA: Diagnosis not present

## 2014-08-16 DIAGNOSIS — R0989 Other specified symptoms and signs involving the circulatory and respiratory systems: Secondary | ICD-10-CM | POA: Diagnosis not present

## 2014-08-18 ENCOUNTER — Telehealth: Payer: Self-pay | Admitting: Cardiovascular Disease

## 2014-08-20 NOTE — Telephone Encounter (Signed)
Closed encounter °

## 2014-08-23 ENCOUNTER — Other Ambulatory Visit: Payer: Self-pay | Admitting: Cardiovascular Disease

## 2014-08-23 DIAGNOSIS — E785 Hyperlipidemia, unspecified: Secondary | ICD-10-CM | POA: Diagnosis not present

## 2014-08-23 DIAGNOSIS — I4891 Unspecified atrial fibrillation: Secondary | ICD-10-CM | POA: Diagnosis not present

## 2014-08-23 DIAGNOSIS — Z7901 Long term (current) use of anticoagulants: Secondary | ICD-10-CM | POA: Diagnosis not present

## 2014-08-23 LAB — COMPREHENSIVE METABOLIC PANEL
ALK PHOS: 48 U/L (ref 39–117)
ALT: 29 U/L (ref 0–53)
AST: 23 U/L (ref 0–37)
Albumin: 4.2 g/dL (ref 3.5–5.2)
BILIRUBIN TOTAL: 0.5 mg/dL (ref 0.2–1.2)
BUN: 23 mg/dL (ref 6–23)
CO2: 27 mEq/L (ref 19–32)
Calcium: 9.3 mg/dL (ref 8.4–10.5)
Chloride: 100 mEq/L (ref 96–112)
Creat: 1.06 mg/dL (ref 0.50–1.35)
GLUCOSE: 295 mg/dL — AB (ref 70–99)
Potassium: 5.3 mEq/L (ref 3.5–5.3)
SODIUM: 135 meq/L (ref 135–145)
TOTAL PROTEIN: 6.3 g/dL (ref 6.0–8.3)

## 2014-08-25 LAB — NMR LIPOPROFILE WITH LIPIDS
CHOLESTEROL, TOTAL: 166 mg/dL (ref 100–199)
HDL PARTICLE NUMBER: 22.2 umol/L — AB (ref >=30.5)
HDL Size: 8.7 nm — ABNORMAL LOW (ref >=9.2)
HDL-C: 26 mg/dL — AB (ref >39)
LDL CALC: 85 mg/dL (ref 0–99)
LDL Particle Number: 1515 nmol/L — ABNORMAL HIGH (ref <1000)
LDL Size: 19.7 nm (ref >=20.8)
LP-IR SCORE: 65 — AB (ref <=45)
Large HDL-P: <1.3 umol/L — ABNORMAL LOW (ref >=4.8)
Large VLDL-P: 2.7 nmol/L (ref <=2.7)
Small LDL Particle Number: 1196 nmol/L — ABNORMAL HIGH (ref <=527)
TRIGLYCERIDES: 274 mg/dL — AB (ref 0–149)
VLDL Size: 46 nm (ref <=46.6)

## 2014-08-26 ENCOUNTER — Encounter: Payer: Self-pay | Admitting: Cardiovascular Disease

## 2014-08-26 ENCOUNTER — Ambulatory Visit (INDEPENDENT_AMBULATORY_CARE_PROVIDER_SITE_OTHER): Payer: Medicare Other | Admitting: Cardiovascular Disease

## 2014-08-26 VITALS — BP 122/60 | HR 57 | Ht 72.0 in | Wt 290.1 lb

## 2014-08-26 DIAGNOSIS — E119 Type 2 diabetes mellitus without complications: Secondary | ICD-10-CM

## 2014-08-26 DIAGNOSIS — Z952 Presence of prosthetic heart valve: Secondary | ICD-10-CM

## 2014-08-26 DIAGNOSIS — Z954 Presence of other heart-valve replacement: Secondary | ICD-10-CM

## 2014-08-26 DIAGNOSIS — I4891 Unspecified atrial fibrillation: Secondary | ICD-10-CM

## 2014-08-26 DIAGNOSIS — Z7901 Long term (current) use of anticoagulants: Secondary | ICD-10-CM

## 2014-08-26 DIAGNOSIS — I4821 Permanent atrial fibrillation: Secondary | ICD-10-CM

## 2014-08-26 DIAGNOSIS — G4730 Sleep apnea, unspecified: Secondary | ICD-10-CM

## 2014-08-26 DIAGNOSIS — I359 Nonrheumatic aortic valve disorder, unspecified: Secondary | ICD-10-CM | POA: Diagnosis not present

## 2014-08-26 DIAGNOSIS — I350 Nonrheumatic aortic (valve) stenosis: Secondary | ICD-10-CM

## 2014-08-26 DIAGNOSIS — G4733 Obstructive sleep apnea (adult) (pediatric): Secondary | ICD-10-CM | POA: Diagnosis not present

## 2014-08-26 NOTE — Patient Instructions (Signed)
Your physician has requested that you have an echocardiogram. Echocardiography is a painless test that uses sound waves to create images of your heart. It provides your doctor with information about the size and shape of your heart and how well your heart's chambers and valves are working. This procedure takes approximately one hour. There are no restrictions for this procedure.  Your physician wants you to follow-up in: 1 year  You will receive a reminder letter in the mail two months in advance. If you don't receive a letter, please call our office to schedule the follow-up appointment.  

## 2014-08-26 NOTE — Progress Notes (Signed)
Patient ID: Joseph Hoffman, male   DOB: [birth year], 63 y.o.   MRN: [id number]        HPI: Joseph Hoffman, is a 63 y.o. male  who presents for a one year followup cardiology evaluation.  Mr. Casebolt has a history of permanent atrial fibrillation dating back to 28. He has a history of type 2 diabetes mellitus. He developed progressive aortic valve stenosis and 09/29/2012 underwent St. Jude aortic valve replacement by [name]. Additional problems also include hypertension, obstructive sleep apnea on CPAP and last year he obtained a new CPAP machine.  He had some transient CHF symptoms after his Lasix had been held following his surgery. He sees Dr. Wilson Singer for his diabetes.   An echo Doppler study in October 2013.  Following his surgery showed an EF of 55-60% and a St. Jude aortic valve was well seated with peak and mean gradients of 30 and 22 mm.  There was no mention of aortic insufficiency.  Over the past year, he states his weight has been fairly stable, but he's been unable to lose additional weight.  His atrial fibrillation.  Rate has been controlled.  At times.  He notes occasional left ankle edema, for which he takes extra Lasix on an as-needed basis.  He states his blood pressure has been controlled on Arava starting to recur milligrams, furosemide 40 mg daily.  He also takes Lanoxin 0.125 mg to assist with rate control, as well as metoprolol 25 mg twice a day.  He is on simvastatin 40 mg hyperlipidemia.  He is on Coumadin anticoagulation.  Presently, he still works in [place] comes back to [place] on weekends.  He walks 30 minutes 3 times per week.  Both his parents died over the past year.   Past Medical History  Diagnosis Date  . Aortic stenosis   . Myocardial infarction   . Dysrhythmia     a fib  . Anxiety   . Asthma   . Sleep apnea     uses CPAP  . Diabetes mellitus without complication   . Heart murmur   . Arthritis   . Hepatitis 2003  . S/P AVR (aortic valve  replacement), 09/29/12 09/30/2012  . Permanent atrial fibrillation, since 1994 09/30/2012    stress test 02/08/12- normal study, no significant ischemia  . DM (diabetes mellitus) 09/30/2012  . Asymptomatic LV dysfunction, EF 45%, normal coronary arteries on cardiac cath 09/18/12 09/30/2012  . Chronic anticoagulation, on Xarelto prior to admit 09/30/2012  . Hypertension   . CHF (congestive heart failure)   . Shortness of breath     Past Surgical History  Procedure Laterality Date  . Cholecystectomy    . Fracture surgery    . Cardiac catheterization  09/18/12    severe calcific aortic stenosis, peak gradient 54m, mean gradient 591m EF 45%  . Cardiac valve replacement      AVR 09-29-12  . Aortic valve replacement  09/29/2012    Procedure: AORTIC VALVE REPLACEMENT (AVR);  Surgeon: BrGaye PollackMD;  [facility] Service: [facility];  Laterality: N/A;    Allergies  Allergen Reactions  . Iohexol Anaphylaxis  . Niacin And Related     Flushing with immediate realese  . Penicillins Other (See Comments)    Unknown..aMarland Kitchenrtic stenosis a child  . Lasix [Furosemide] Rash    Current Outpatient Prescriptions  Medication Sig Dispense Refill  . acetaminophen (TYLENOL) 325 MG tablet Take 2 tablets (650 mg total) by  mouth every 4 (four) hours as needed for pain or fever.      [name] albuterol (PROVENTIL HFA;VENTOLIN HFA) 108 (90 BASE) MCG/ACT inhaler Inhale 2 puffs into the lungs every 6 (six) hours as needed. For shortness of breath      . Ascorbic Acid (VITAMIN C) 1000 MG tablet Take 1,000 mg by mouth daily.      . B-D ULTRAFINE III SHORT PEN 31G X 8 MM MISC       . betamethasone valerate (VALISONE) 0.1 % cream Apply 1 application topically daily.      . Blood Glucose Monitoring Suppl (BAYER CONTOUR MONITOR) W/DEVICE KIT       . cholecalciferol (VITAMIN D) 1000 UNITS tablet Take 1,000 Units by mouth daily.      . Chromium 200 MCG CAPS Take 200 mcg by mouth daily.      .  clotrimazole-betamethasone (LOTRISONE) cream Apply 1 application topically as needed.      . digoxin (LANOXIN) 0.125 MG tablet TAKE 1 TABLET DAILY  90 tablet  1  . EASY TOUCH LANCETS 30G/TWIST MISC       . escitalopram (LEXAPRO) 10 MG tablet Take 10 mg by mouth every evening.       . fish oil-omega-3 fatty acids 1000 MG capsule Take 2 g by mouth daily.      . Fluticasone-Salmeterol (ADVAIR) 250-50 MCG/DOSE AEPB Inhale 1 puff into the lungs daily. For asthma related symptoms      . furosemide (LASIX) 40 MG tablet Take 40 mg by mouth daily.      . Glucosamine-Chondroit-Vit C-Mn (GLUCOSAMINE 1500 COMPLEX PO) Take 1,500 mg by mouth daily.      [name] glyBURIDE (DIABETA) 5 MG tablet Take 5 mg by mouth daily with breakfast.      . insulin glargine (LANTUS) 100 UNIT/ML injection Inject 5-20 Units into the skin 2 (two) times daily. 5-20 units at breakfast and dinner depending on blood sugar levels      . irbesartan (AVAPRO) 150 MG tablet Take 300 mg by mouth daily.      . irbesartan (AVAPRO) 300 MG tablet Take 1 tablet (300 mg total) by mouth daily.  90 tablet  3  . Liraglutide (VICTOZA [place]) Inject 1.8 mg into the skin 1 day or 1 dose.      . metFORMIN (GLUCOPHAGE-XR) 500 MG 24 hr tablet Take 2,000 mg by mouth at bedtime.      . metoprolol tartrate (LOPRESSOR) 25 MG tablet Take 1 tablet (25 mg total) by mouth 2 (two) times daily.  180 tablet  3  . Multiple Vitamins-Minerals (CENTRUM SPECIALIST HEART PO) Take 1 tablet by mouth 2 (two) times daily.      . Potassium Gluconate 550 MG TABS Take 1 tablet by mouth daily.      . simvastatin (ZOCOR) 40 MG tablet Take 1 tablet (40 mg total) by mouth at bedtime.  90 tablet  3  . warfarin (COUMADIN) 10 MG tablet Take 10-15 mg by mouth every evening. 10 mg on Tues, Thurs, Sat and Sun and 12.5 mg on Mon, Wed, Frid       No current facility-administered medications for this visit.    History   Social History  . Marital Status: Married    Spouse Name: [name]    Number  of Children: 3  . Years of Education: 15+   Occupational History  .  [employer]   Social History Main Topics  . Smoking status: Former Smoker --  0.25 packs/day for 10 years    Types: Cigarettes    Quit date: 09/25/1982  . Smokeless tobacco: Former Systems developer  . Alcohol Use: Yes     Comment: occ. beer/wine  . Drug Use: No  . Sexual Activity: Yes    Birth Control/ Protection: None   Other Topics Concern  . Not on file   Social History Narrative   Patient is married [name]) and lives with his wife and son.   Patient has three children.   Patient is working full-time.   Patient has a college education.   Patient is right handed.   Patient drinks about 2-3 cups of coffee daily.    Family History  Problem Relation Age of Onset  . Hypertension Mother   . Diabetes Father   . Hyperlipidemia Brother 86    stents placed  . Heart attack Brother 47      ROS General: Negative; No fevers, chills, or night sweats; positive for obesity HEENT: Negative; No changes in vision or hearing, sinus congestion, difficulty swallowing Pulmonary: Negative; No cough, wheezing, shortness of breath, hemoptysis Cardiovascular: Negative; No chest pain, presyncope, syncope, palpitations Rare left ankle swelling GI: Negative; No nausea, vomiting, diarrhea, or abdominal pain GU: Negative; No dysuria, hematuria, or difficulty voiding Musculoskeletal: Negative; no myalgias, joint pain, or weakness Hematologic/Oncology: Negative; no easy bruising, bleeding Endocrine: Negative; no heat/cold intolerance; no diabetes Neuro: Negative; no changes in balance, headaches Skin: Negative; No rashes or skin lesions Psychiatric: Negative; No behavioral problems, depression Sleep: Negative; No snoring, daytime sleepiness, hypersomnolence, bruxism, restless legs, hypnogognic hallucinations, no cataplexy   PE BP 122/60  Pulse 57  Ht 6' (1.829 m)  Wt 290 lb 1.6 oz (131.588 kg)  BMI 39.34 kg/m2  Repeat blood  pressure 114/70 General: Alert, oriented, no distress.  Moderate obesity  Skin: normal turgor, no rashes HEENT: Normocephalic, atraumatic. Pupils round and reactive; sclera anicteric;no lid lag.  Nose without nasal septal hypertrophy Mouth/Parynx benign; Mallinpatti scale 3 Neck: No JVD, no carotid bruits; normal carotid upstroke Lungs: clear to ausculatation and percussion; no wheezing or rales No chest wall tenderness to palpation Heart: Irregularly irregular rhythm compatible with his atrial fibrillation with a controlled ventricular response in the 70s;, s1 s2 normal 4-1/9 systolic murmur concordant with his St. Jude aortic valve with crisp clicks;  I am now hearing a 1-2/6 aortic insufficiency murmur which was not present last year;  no S3. Abdomen: soft, nontender; no hepatosplenomehaly, BS+; abdominal aorta nontender and not dilated by palpation. Pulses 2+ Extremities: no clubbing cyanosis or edema, Homan's sign negative  Neurologic: grossly nonfocal Psychological: Normal affect and mood  ECG (independently read by me): Atrial fibrillation with ventricular response of 57 beats per minute.  Nonspecific intraventricular block.  Right axis deviation.  Prior October 2014 ECG: Atrial fibrillation with ventricular rate in the 70s. Nonspecific interventricular conduction delay. Right superior axis deviation.  LABS:  BMET    Component Value Date/Time   NA 135 08/23/2014 0853   K 5.3 08/23/2014 0853   CL 100 08/23/2014 0853   CO2 27 08/23/2014 0853   GLUCOSE 295* 08/23/2014 0853   BUN 23 08/23/2014 0853   CREATININE 1.06 08/23/2014 0853   CREATININE 0.86 10/15/2012 0605   CALCIUM 9.3 08/23/2014 0853   GFRNONAA >90 10/15/2012 0605   GFRAA >90 10/15/2012 0605     Hepatic Function Panel     Component Value Date/Time   PROT 6.3 08/23/2014 0853   ALBUMIN 4.2 08/23/2014 0853  AST 23 08/23/2014 0853   ALT 29 08/23/2014 0853   ALKPHOS 48 08/23/2014 0853   BILITOT 0.5 08/23/2014 0853      CBC    Component Value Date/Time   WBC 9.5 08/24/2013 1023   RBC 4.71 08/24/2013 1023   HGB 13.2 08/24/2013 1023   HCT 38.1* 08/24/2013 1023   PLT 189 08/24/2013 1023   MCV 80.9 08/24/2013 1023   MCH 28.0 08/24/2013 1023   MCHC 34.6 08/24/2013 1023   RDW 15.0 08/24/2013 1023   LYMPHSABS 1.6 08/24/2013 1023   MONOABS 0.8 08/24/2013 1023   EOSABS 0.3 08/24/2013 1023   BASOSABS 0.0 08/24/2013 1023     BNP    Component Value Date/Time   PROBNP 2257.0* 10/12/2012 1400    Lipid Panel     Component Value Date/Time   CHOL 144 08/24/2013 1023   TRIG 274* 08/23/2014 0853   TRIG 192* 08/24/2013 1023   HDL 31* 08/24/2013 1023   CHOLHDL 4.6 08/24/2013 1023   VLDL 38 08/24/2013 1023   LDLCALC 85 08/23/2014 0853   LDLCALC 75 08/24/2013 1023     RADIOLOGY: No results found.    ASSESSMENT AND PLAN: Mr.Ebling is now 2 years status post aortic valve replacement for severe aortic stenosis with a St. Jude aortic prosthetic valve.  He has permanent atrial fibrillation and is on Coumadin anticoagulation with therapeutic anticoagulation.  Physical exam today demonstrates that his atrial fibrillation.  Rate is well-controlled.  On auscultation.  He does have a 1-2/6 aortic insufficiency murmur which was not heard one year ago.  I am recommending he have a followup echo Doppler study to reassess his systolic and diastolic function.  A St. Jude valve prosthesis.  We discussed the importance of weight loss.  He is approaching morbid obesity.  He's been walking 3 days per week.  I suggested he increase this to at least 5 days per week.  He has obstructive sleep apnea and is utilizing his CPAP with 100% compliance.  He tolerates his new machine well.  He's unaware of any breakthrough snoring.  He denies any residual hypersomnolence.  He states he is being followed for his diabetes with Dr. Rexene Edison, but he has had issues in the past.  If in some is increased with weight gain.  There potentially may be some future  consideration for candidacy for gastric bypass. Dr. Wilson Singer has checked laboratory within the past week.  I will try to obtain these results.  If he does note occasional leg swelling he can take an extra furosemide on an as-needed basis. I will notify him regarding his echo Doppler study.  Since he sees Dr. Wilson Singer every 3 months, I will see him in one year for cardiology reevaluation or sooner if problems arise.  Troy Sine, MD, [facility]  08/26/2014 10:30 AM

## 2014-08-30 ENCOUNTER — Ambulatory Visit (HOSPITAL_COMMUNITY)
Admission: RE | Admit: 2014-08-30 | Discharge: 2014-08-30 | Disposition: A | Discharge status: Home / Self Care | Payer: Medicare Other | Source: Ambulatory Visit | Attending: Internal Medicine | Admitting: Internal Medicine

## 2014-08-30 DIAGNOSIS — I350 Nonrheumatic aortic (valve) stenosis: Secondary | ICD-10-CM

## 2014-08-30 DIAGNOSIS — I4891 Unspecified atrial fibrillation: Secondary | ICD-10-CM | POA: Insufficient documentation

## 2014-08-30 DIAGNOSIS — I059 Rheumatic mitral valve disease, unspecified: Secondary | ICD-10-CM

## 2014-08-30 DIAGNOSIS — I10 Essential (primary) hypertension: Secondary | ICD-10-CM | POA: Insufficient documentation

## 2014-08-30 DIAGNOSIS — Z7901 Long term (current) use of anticoagulants: Secondary | ICD-10-CM

## 2014-08-30 DIAGNOSIS — E119 Type 2 diabetes mellitus without complications: Secondary | ICD-10-CM | POA: Insufficient documentation

## 2014-08-30 NOTE — Progress Notes (Signed)
2D Echo Performed 08/30/2014    Marygrace Drought, RCS

## 2014-09-03 ENCOUNTER — Telehealth: Payer: Self-pay | Admitting: *Deleted

## 2014-09-03 NOTE — Telephone Encounter (Signed)
Spoke with patient and he informs me that Dr. Wilson Singer did address his cholesterol.

## 2014-09-03 NOTE — Telephone Encounter (Signed)
Message copied by Lauralee Evener on Fri Sep 03, 2014  4:48 PM ------      Message from: Shelva Majestic A      Created: Mon Aug 30, 2014  1:29 PM       Labs drawn by Dr. Wilson Singer. Did he act on them?  Consider changing simvastatin 40 mg to atorvastatin 40 mg and adding fenofibrate 145 mg to regimen if no changes were made by Dr. Wilson Singer. Dr Wilson Singer is also following his DM. ------

## 2014-09-13 DIAGNOSIS — E119 Type 2 diabetes mellitus without complications: Secondary | ICD-10-CM | POA: Diagnosis not present

## 2014-09-20 DIAGNOSIS — Z7901 Long term (current) use of anticoagulants: Secondary | ICD-10-CM | POA: Diagnosis not present

## 2014-09-20 DIAGNOSIS — I482 Chronic atrial fibrillation: Secondary | ICD-10-CM | POA: Diagnosis not present

## 2014-10-18 DIAGNOSIS — I482 Chronic atrial fibrillation: Secondary | ICD-10-CM | POA: Diagnosis not present

## 2014-10-18 DIAGNOSIS — Z7901 Long term (current) use of anticoagulants: Secondary | ICD-10-CM | POA: Diagnosis not present

## 2014-11-04 DIAGNOSIS — E118 Type 2 diabetes mellitus with unspecified complications: Secondary | ICD-10-CM | POA: Diagnosis not present

## 2014-11-04 DIAGNOSIS — I10 Essential (primary) hypertension: Secondary | ICD-10-CM | POA: Diagnosis not present

## 2014-11-04 DIAGNOSIS — I349 Nonrheumatic mitral valve disorder, unspecified: Secondary | ICD-10-CM | POA: Diagnosis not present

## 2014-11-04 DIAGNOSIS — E039 Hypothyroidism, unspecified: Secondary | ICD-10-CM | POA: Diagnosis not present

## 2014-11-11 ENCOUNTER — Encounter (HOSPITAL_COMMUNITY): Payer: Self-pay | Admitting: Cardiovascular Disease

## 2014-11-15 DIAGNOSIS — I482 Chronic atrial fibrillation: Secondary | ICD-10-CM | POA: Diagnosis not present

## 2014-11-15 DIAGNOSIS — Z7901 Long term (current) use of anticoagulants: Secondary | ICD-10-CM | POA: Diagnosis not present

## 2014-12-13 DIAGNOSIS — Z7901 Long term (current) use of anticoagulants: Secondary | ICD-10-CM | POA: Diagnosis not present

## 2014-12-13 DIAGNOSIS — I482 Chronic atrial fibrillation: Secondary | ICD-10-CM | POA: Diagnosis not present

## 2015-01-13 DIAGNOSIS — L0390 Cellulitis, unspecified: Secondary | ICD-10-CM | POA: Diagnosis not present

## 2015-01-13 DIAGNOSIS — I10 Essential (primary) hypertension: Secondary | ICD-10-CM | POA: Diagnosis not present

## 2015-01-13 DIAGNOSIS — E118 Type 2 diabetes mellitus with unspecified complications: Secondary | ICD-10-CM | POA: Diagnosis not present

## 2015-01-13 DIAGNOSIS — M19072 Primary osteoarthritis, left ankle and foot: Secondary | ICD-10-CM | POA: Diagnosis not present

## 2015-01-13 DIAGNOSIS — I739 Peripheral vascular disease, unspecified: Secondary | ICD-10-CM | POA: Diagnosis not present

## 2015-01-26 DIAGNOSIS — R21 Rash and other nonspecific skin eruption: Secondary | ICD-10-CM | POA: Diagnosis not present

## 2015-04-04 DIAGNOSIS — Z7901 Long term (current) use of anticoagulants: Secondary | ICD-10-CM | POA: Diagnosis not present

## 2015-04-04 DIAGNOSIS — I482 Chronic atrial fibrillation: Secondary | ICD-10-CM | POA: Diagnosis not present

## 2015-04-05 DIAGNOSIS — E118 Type 2 diabetes mellitus with unspecified complications: Secondary | ICD-10-CM | POA: Diagnosis not present

## 2015-04-18 DIAGNOSIS — E118 Type 2 diabetes mellitus with unspecified complications: Secondary | ICD-10-CM | POA: Diagnosis not present

## 2015-04-18 DIAGNOSIS — I10 Essential (primary) hypertension: Secondary | ICD-10-CM | POA: Diagnosis not present

## 2015-04-18 DIAGNOSIS — E789 Disorder of lipoprotein metabolism, unspecified: Secondary | ICD-10-CM | POA: Diagnosis not present

## 2015-05-30 DIAGNOSIS — Z7901 Long term (current) use of anticoagulants: Secondary | ICD-10-CM | POA: Diagnosis not present

## 2015-05-30 DIAGNOSIS — I482 Chronic atrial fibrillation: Secondary | ICD-10-CM | POA: Diagnosis not present

## 2015-08-04 DIAGNOSIS — R109 Unspecified abdominal pain: Secondary | ICD-10-CM | POA: Diagnosis not present

## 2015-08-04 DIAGNOSIS — I359 Nonrheumatic aortic valve disorder, unspecified: Secondary | ICD-10-CM | POA: Diagnosis not present

## 2015-08-04 DIAGNOSIS — N390 Urinary tract infection, site not specified: Secondary | ICD-10-CM | POA: Diagnosis not present

## 2015-08-04 DIAGNOSIS — E118 Type 2 diabetes mellitus with unspecified complications: Secondary | ICD-10-CM | POA: Diagnosis not present

## 2015-08-09 DIAGNOSIS — Z7901 Long term (current) use of anticoagulants: Secondary | ICD-10-CM | POA: Diagnosis not present

## 2015-08-09 DIAGNOSIS — I482 Chronic atrial fibrillation: Secondary | ICD-10-CM | POA: Diagnosis not present

## 2015-08-09 DIAGNOSIS — E118 Type 2 diabetes mellitus with unspecified complications: Secondary | ICD-10-CM | POA: Diagnosis not present

## 2015-09-08 DIAGNOSIS — I482 Chronic atrial fibrillation: Secondary | ICD-10-CM | POA: Diagnosis not present

## 2015-09-08 DIAGNOSIS — Z23 Encounter for immunization: Secondary | ICD-10-CM | POA: Diagnosis not present

## 2015-09-08 DIAGNOSIS — Z7901 Long term (current) use of anticoagulants: Secondary | ICD-10-CM | POA: Diagnosis not present

## 2015-09-19 DIAGNOSIS — G4733 Obstructive sleep apnea (adult) (pediatric): Secondary | ICD-10-CM | POA: Diagnosis not present

## 2015-10-25 DIAGNOSIS — Z7901 Long term (current) use of anticoagulants: Secondary | ICD-10-CM | POA: Diagnosis not present

## 2015-10-25 DIAGNOSIS — I482 Chronic atrial fibrillation: Secondary | ICD-10-CM | POA: Diagnosis not present

## 2015-12-08 DIAGNOSIS — I482 Chronic atrial fibrillation: Secondary | ICD-10-CM | POA: Diagnosis not present

## 2015-12-08 DIAGNOSIS — Z7901 Long term (current) use of anticoagulants: Secondary | ICD-10-CM | POA: Diagnosis not present

## 2016-02-01 DIAGNOSIS — E118 Type 2 diabetes mellitus with unspecified complications: Secondary | ICD-10-CM | POA: Diagnosis not present

## 2016-02-01 DIAGNOSIS — I359 Nonrheumatic aortic valve disorder, unspecified: Secondary | ICD-10-CM | POA: Diagnosis not present

## 2016-02-02 DIAGNOSIS — I482 Chronic atrial fibrillation: Secondary | ICD-10-CM | POA: Diagnosis not present

## 2016-02-02 DIAGNOSIS — Z7901 Long term (current) use of anticoagulants: Secondary | ICD-10-CM | POA: Diagnosis not present

## 2016-02-06 ENCOUNTER — Other Ambulatory Visit: Payer: Self-pay | Admitting: Endocrinology

## 2016-02-06 DIAGNOSIS — R52 Pain, unspecified: Secondary | ICD-10-CM

## 2016-02-10 ENCOUNTER — Ambulatory Visit
Admission: RE | Admit: 2016-02-10 | Discharge: 2016-02-10 | Disposition: A | Discharge status: Home / Self Care | Payer: Medicare Other | Source: Ambulatory Visit | Attending: Endocrinology | Admitting: Endocrinology

## 2016-02-10 DIAGNOSIS — M25562 Pain in left knee: Secondary | ICD-10-CM | POA: Diagnosis not present

## 2016-02-10 DIAGNOSIS — R52 Pain, unspecified: Secondary | ICD-10-CM

## 2016-02-23 DIAGNOSIS — Z7901 Long term (current) use of anticoagulants: Secondary | ICD-10-CM | POA: Diagnosis not present

## 2016-02-23 DIAGNOSIS — I482 Chronic atrial fibrillation: Secondary | ICD-10-CM | POA: Diagnosis not present

## 2016-03-22 DIAGNOSIS — Z7901 Long term (current) use of anticoagulants: Secondary | ICD-10-CM | POA: Diagnosis not present

## 2016-03-22 DIAGNOSIS — I482 Chronic atrial fibrillation: Secondary | ICD-10-CM | POA: Diagnosis not present

## 2016-04-10 DIAGNOSIS — E781 Pure hyperglyceridemia: Secondary | ICD-10-CM | POA: Diagnosis not present

## 2016-04-10 DIAGNOSIS — E789 Disorder of lipoprotein metabolism, unspecified: Secondary | ICD-10-CM | POA: Diagnosis not present

## 2016-04-10 DIAGNOSIS — E118 Type 2 diabetes mellitus with unspecified complications: Secondary | ICD-10-CM | POA: Diagnosis not present

## 2016-04-17 DIAGNOSIS — E789 Disorder of lipoprotein metabolism, unspecified: Secondary | ICD-10-CM | POA: Diagnosis not present

## 2016-04-17 DIAGNOSIS — E118 Type 2 diabetes mellitus with unspecified complications: Secondary | ICD-10-CM | POA: Diagnosis not present

## 2016-04-17 DIAGNOSIS — I4891 Unspecified atrial fibrillation: Secondary | ICD-10-CM | POA: Diagnosis not present

## 2016-04-17 DIAGNOSIS — I10 Essential (primary) hypertension: Secondary | ICD-10-CM | POA: Diagnosis not present

## 2016-05-22 DIAGNOSIS — E118 Type 2 diabetes mellitus with unspecified complications: Secondary | ICD-10-CM | POA: Diagnosis not present

## 2016-05-22 DIAGNOSIS — I359 Nonrheumatic aortic valve disorder, unspecified: Secondary | ICD-10-CM | POA: Diagnosis not present

## 2016-05-22 DIAGNOSIS — I4891 Unspecified atrial fibrillation: Secondary | ICD-10-CM | POA: Diagnosis not present

## 2016-05-28 DIAGNOSIS — Z7901 Long term (current) use of anticoagulants: Secondary | ICD-10-CM | POA: Diagnosis not present

## 2016-05-28 DIAGNOSIS — I482 Chronic atrial fibrillation: Secondary | ICD-10-CM | POA: Diagnosis not present

## 2016-06-11 DIAGNOSIS — Z7901 Long term (current) use of anticoagulants: Secondary | ICD-10-CM | POA: Diagnosis not present

## 2016-06-11 DIAGNOSIS — I482 Chronic atrial fibrillation: Secondary | ICD-10-CM | POA: Diagnosis not present

## 2016-06-25 DIAGNOSIS — I482 Chronic atrial fibrillation: Secondary | ICD-10-CM | POA: Diagnosis not present

## 2016-06-25 DIAGNOSIS — Z7901 Long term (current) use of anticoagulants: Secondary | ICD-10-CM | POA: Diagnosis not present

## 2016-07-06 DIAGNOSIS — E113293 Type 2 diabetes mellitus with mild nonproliferative diabetic retinopathy without macular edema, bilateral: Secondary | ICD-10-CM | POA: Diagnosis not present

## 2016-07-24 DIAGNOSIS — I482 Chronic atrial fibrillation: Secondary | ICD-10-CM | POA: Diagnosis not present

## 2016-07-24 DIAGNOSIS — Z7901 Long term (current) use of anticoagulants: Secondary | ICD-10-CM | POA: Diagnosis not present

## 2016-08-20 DIAGNOSIS — J45901 Unspecified asthma with (acute) exacerbation: Secondary | ICD-10-CM | POA: Diagnosis not present

## 2016-08-23 DIAGNOSIS — R05 Cough: Secondary | ICD-10-CM | POA: Diagnosis not present

## 2016-08-23 DIAGNOSIS — I349 Nonrheumatic mitral valve disorder, unspecified: Secondary | ICD-10-CM | POA: Diagnosis not present

## 2016-08-23 DIAGNOSIS — E118 Type 2 diabetes mellitus with unspecified complications: Secondary | ICD-10-CM | POA: Diagnosis not present

## 2016-09-03 DIAGNOSIS — Z7901 Long term (current) use of anticoagulants: Secondary | ICD-10-CM | POA: Diagnosis not present

## 2016-09-03 DIAGNOSIS — I482 Chronic atrial fibrillation: Secondary | ICD-10-CM | POA: Diagnosis not present

## 2016-09-03 DIAGNOSIS — Z23 Encounter for immunization: Secondary | ICD-10-CM | POA: Diagnosis not present

## 2016-10-04 DIAGNOSIS — I482 Chronic atrial fibrillation: Secondary | ICD-10-CM | POA: Diagnosis not present

## 2016-10-04 DIAGNOSIS — Z7901 Long term (current) use of anticoagulants: Secondary | ICD-10-CM | POA: Diagnosis not present

## 2016-11-19 DIAGNOSIS — Z7901 Long term (current) use of anticoagulants: Secondary | ICD-10-CM | POA: Diagnosis not present

## 2016-11-19 DIAGNOSIS — I482 Chronic atrial fibrillation: Secondary | ICD-10-CM | POA: Diagnosis not present

## 2016-12-17 DIAGNOSIS — Z7901 Long term (current) use of anticoagulants: Secondary | ICD-10-CM | POA: Diagnosis not present

## 2016-12-17 DIAGNOSIS — I482 Chronic atrial fibrillation: Secondary | ICD-10-CM | POA: Diagnosis not present

## 2017-01-17 DIAGNOSIS — I482 Chronic atrial fibrillation: Secondary | ICD-10-CM | POA: Diagnosis not present

## 2017-01-17 DIAGNOSIS — I10 Essential (primary) hypertension: Secondary | ICD-10-CM | POA: Diagnosis not present

## 2017-01-17 DIAGNOSIS — Z7901 Long term (current) use of anticoagulants: Secondary | ICD-10-CM | POA: Diagnosis not present

## 2017-02-14 DIAGNOSIS — I482 Chronic atrial fibrillation: Secondary | ICD-10-CM | POA: Diagnosis not present

## 2017-02-14 DIAGNOSIS — Z7901 Long term (current) use of anticoagulants: Secondary | ICD-10-CM | POA: Diagnosis not present

## 2017-03-18 DIAGNOSIS — I10 Essential (primary) hypertension: Secondary | ICD-10-CM | POA: Diagnosis not present

## 2017-03-18 DIAGNOSIS — Z7901 Long term (current) use of anticoagulants: Secondary | ICD-10-CM | POA: Diagnosis not present

## 2017-03-18 DIAGNOSIS — I482 Chronic atrial fibrillation: Secondary | ICD-10-CM | POA: Diagnosis not present

## 2017-04-11 ENCOUNTER — Encounter: Payer: Self-pay | Admitting: *Deleted

## 2017-04-11 ENCOUNTER — Telehealth: Payer: Self-pay | Admitting: Cardiovascular Disease

## 2017-04-11 ENCOUNTER — Other Ambulatory Visit: Payer: Self-pay | Admitting: *Deleted

## 2017-04-11 DIAGNOSIS — I4821 Permanent atrial fibrillation: Secondary | ICD-10-CM

## 2017-04-11 DIAGNOSIS — Z79899 Other long term (current) drug therapy: Secondary | ICD-10-CM

## 2017-04-11 DIAGNOSIS — Z794 Long term (current) use of insulin: Secondary | ICD-10-CM

## 2017-04-11 DIAGNOSIS — E785 Hyperlipidemia, unspecified: Secondary | ICD-10-CM | POA: Insufficient documentation

## 2017-04-11 DIAGNOSIS — E119 Type 2 diabetes mellitus without complications: Secondary | ICD-10-CM

## 2017-04-11 NOTE — Telephone Encounter (Signed)
Spoke with the patient. He stated that he has an appointment on 6/5 with Dr. Claiborne Billings and would like to know if he needs labs drawn. Will route to the physician for further recommendation.

## 2017-04-11 NOTE — Telephone Encounter (Signed)
Labs ordered per Dr Evette Georges protocol. Slips mailed to patient.

## 2017-04-11 NOTE — Telephone Encounter (Signed)
Patient calling, patient states that he needs to have blood work completed. Please verify if he will need to have this completed. Thanks.

## 2017-04-11 NOTE — Telephone Encounter (Signed)
Patient notified that I will be sending lab orders to him.

## 2017-04-15 DIAGNOSIS — I10 Essential (primary) hypertension: Secondary | ICD-10-CM | POA: Diagnosis not present

## 2017-04-15 DIAGNOSIS — I482 Chronic atrial fibrillation: Secondary | ICD-10-CM | POA: Diagnosis not present

## 2017-04-15 DIAGNOSIS — Z7901 Long term (current) use of anticoagulants: Secondary | ICD-10-CM | POA: Diagnosis not present

## 2017-05-07 ENCOUNTER — Ambulatory Visit: Payer: Medicare Other | Admitting: Cardiovascular Disease

## 2017-05-09 DIAGNOSIS — I482 Chronic atrial fibrillation: Secondary | ICD-10-CM | POA: Diagnosis not present

## 2017-05-09 DIAGNOSIS — Z125 Encounter for screening for malignant neoplasm of prostate: Secondary | ICD-10-CM | POA: Diagnosis not present

## 2017-05-09 DIAGNOSIS — E789 Disorder of lipoprotein metabolism, unspecified: Secondary | ICD-10-CM | POA: Diagnosis not present

## 2017-05-09 DIAGNOSIS — Z7901 Long term (current) use of anticoagulants: Secondary | ICD-10-CM | POA: Diagnosis not present

## 2017-05-09 DIAGNOSIS — E118 Type 2 diabetes mellitus with unspecified complications: Secondary | ICD-10-CM | POA: Diagnosis not present

## 2017-05-21 NOTE — Progress Notes (Deleted)
Cardiology Office Note    Date:  05/21/2017   ID:  [place], Code [birth year], MRN [id number]  PCP:  Anda Kraft, MD  Cardiologist: Dr. Claiborne Billings   No chief complaint on file.   History of Present Illness:    Joseph Hoffman is a 66 y.o. male with past medical history of permanent atrial fibrillation (on Coumadin), severe AS (s/p AVR in 09/2012), OSA (on CPAP), chronic diastolic CHF, and Type 2 DM who presents to the office today for routine follow-up.   He was last examined by Dr. Claiborne Billings in 08/2014 and reported doing well from a cardiac perspective at that time.  Past Medical History:  Diagnosis Date  . Anxiety   . Aortic stenosis   . Arthritis   . Asthma   . Asymptomatic LV dysfunction, EF 45%, normal coronary arteries on cardiac cath 09/18/12 09/30/2012  . CHF (congestive heart failure)   . Chronic anticoagulation, on Xarelto prior to admit 09/30/2012  . Diabetes mellitus without complication   . DM (diabetes mellitus) 09/30/2012  . Dysrhythmia    a fib  . Heart murmur   . Hepatitis 2003  . Hypertension   . Myocardial infarction ([place])   . Permanent atrial fibrillation, since 1994 09/30/2012   stress test 02/08/12- normal study, no significant ischemia  . S/P AVR (aortic valve replacement), 09/29/12 09/30/2012  . Shortness of breath   . Sleep apnea    uses CPAP    Past Surgical History:  Procedure Laterality Date  . ABDOMINAL ANGIOGRAM  09/18/2012   Procedure: ABDOMINAL ANGIOGRAM;  Surgeon: Troy Sine, MD;  Location: [facility] CATH LAB;  Service: Cardiovascular;;  . AORTIC VALVE REPLACEMENT  09/29/2012   Procedure: AORTIC VALVE REPLACEMENT (AVR);  Surgeon: Gaye Pollack, MD;  Location: [place];  Service: Open Heart Surgery;  Laterality: N/A;  . ARCH AORTOGRAM  09/18/2012   Procedure: ARCH AORTOGRAM;  Surgeon: Troy Sine, MD;  Location: [facility] CATH LAB;  Service: Cardiovascular;;  . CARDIAC CATHETERIZATION  09/18/12   severe calcific aortic stenosis, peak gradient  38m, mean gradient 562m EF 45%  . CARDIAC VALVE REPLACEMENT     AVR 09-29-12  . CHOLECYSTECTOMY    . FRACTURE SURGERY    . LEFT AND RIGHT HEART CATHETERIZATION WITH CORONARY ANGIOGRAM N/A 09/18/2012   Procedure: LEFT AND RIGHT HEART CATHETERIZATION WITH CORONARY ANGIOGRAM;  Surgeon: ThTroy SineMD;  [facility];  Service: Cardiovascular;  Laterality: N/A;    Current Medications: Outpatient Medications Prior to Visit  Medication Sig Dispense Refill  . acetaminophen (TYLENOL) 325 MG tablet Take 2 tablets (650 mg total) by mouth every 4 (four) hours as needed for pain or fever.    . Marland Kitchenlbuterol (PROVENTIL HFA;VENTOLIN HFA) 108 (90 BASE) MCG/ACT inhaler Inhale 2 puffs into the lungs every 6 (six) hours as needed. For shortness of breath    . Ascorbic Acid (VITAMIN C) 1000 MG tablet Take 1,000 mg by mouth daily.    . B-D ULTRAFINE III SHORT PEN 31G X 8 MM MISC     . betamethasone valerate (VALISONE) 0.1 % cream Apply 1 application topically daily.    . Blood Glucose Monitoring Suppl (BAYER CONTOUR MONITOR) W/DEVICE KIT     . cholecalciferol (VITAMIN D) 1000 UNITS tablet Take 1,000 Units by mouth daily.    . Chromium 200 MCG CAPS Take 200 mcg by mouth daily.    . clotrimazole-betamethasone (LOTRISONE) cream Apply 1 application topically as needed.    .[name]  digoxin (LANOXIN) 0.125 MG tablet TAKE 1 TABLET DAILY 90 tablet 1  . EASY TOUCH LANCETS 30G/TWIST MISC     . escitalopram (LEXAPRO) 10 MG tablet Take 10 mg by mouth every evening.     . fish oil-omega-3 fatty acids 1000 MG capsule Take 2 g by mouth daily.    . Fluticasone-Salmeterol (ADVAIR) 250-50 MCG/DOSE AEPB Inhale 1 puff into the lungs daily. For asthma related symptoms    . furosemide (LASIX) 40 MG tablet Take 40 mg by mouth daily.    . Glucosamine-Chondroit-Vit C-Mn (GLUCOSAMINE 1500 COMPLEX PO) Take 1,500 mg by mouth daily.    [name] glyBURIDE (DIABETA) 5 MG tablet Take 5 mg by mouth daily with breakfast.    . insulin glargine  (LANTUS) 100 UNIT/ML injection Inject 5-20 Units into the skin 2 (two) times daily. 5-20 units at breakfast and dinner depending on blood sugar levels    . irbesartan (AVAPRO) 150 MG tablet Take 300 mg by mouth daily.    . irbesartan (AVAPRO) 300 MG tablet Take 1 tablet (300 mg total) by mouth daily. 90 tablet 3  . Liraglutide (VICTOZA [place]) Inject 1.8 mg into the skin 1 day or 1 dose.    . metFORMIN (GLUCOPHAGE-XR) 500 MG 24 hr tablet Take 2,000 mg by mouth at bedtime.    . metoprolol tartrate (LOPRESSOR) 25 MG tablet Take 1 tablet (25 mg total) by mouth 2 (two) times daily. 180 tablet 3  . Multiple Vitamins-Minerals (CENTRUM SPECIALIST HEART PO) Take 1 tablet by mouth 2 (two) times daily.    . Potassium Gluconate 550 MG TABS Take 1 tablet by mouth daily.    . simvastatin (ZOCOR) 40 MG tablet Take 1 tablet (40 mg total) by mouth at bedtime. 90 tablet 3  . warfarin (COUMADIN) 10 MG tablet Take 10-15 mg by mouth every evening. 10 mg on Tues, Thurs, Sat and Sun and 12.5 mg on Mon, Wed, Frid     No facility-administered medications prior to visit.      Allergies:   Iohexol; Niacin and related; Penicillins; and Lasix [furosemide]   Social History   Social History  . Marital status: Married    Spouse name: [name]  . Number of children: 3  . Years of education: 15+   Occupational History  .  [employer]   Social History Main Topics  . Smoking status: Former Smoker    Packs/day: 0.25    Years: 10.00    Types: Cigarettes    Quit date: 09/25/1982  . Smokeless tobacco: Former Systems developer  . Alcohol use Yes     Comment: occ. beer/wine  . Drug use: No  . Sexual activity: Yes    Birth control/ protection: None   Other Topics Concern  . Not on file   Social History Narrative   Patient is married [name]) and lives with his wife and son.   Patient has three children.   Patient is working full-time.   Patient has a college education.   Patient is right handed.   Patient drinks about 2-3 cups of  coffee daily.     Family History:  The patient's ***family history includes Diabetes in his father; Heart attack (age of onset: 90) in his brother; Hyperlipidemia (age of onset: 55) in his brother; Hypertension in his mother.   Review of Systems:   Please see the history of present illness.     General:  No chills, fever, night sweats or weight changes.  Cardiovascular:  No chest  pain, dyspnea on exertion, edema, orthopnea, palpitations, paroxysmal nocturnal dyspnea. Dermatological: No rash, lesions/masses Respiratory: No cough, dyspnea Urologic: No hematuria, dysuria Abdominal:   No nausea, vomiting, diarrhea, bright red blood per rectum, melena, or hematemesis Neurologic:  No visual changes, wkns, changes in mental status. All other systems reviewed and are otherwise negative except as noted above.   Physical Exam:    VS:  There were no vitals taken for this visit.   General: Well developed, well nourished,male appearing in no acute distress. Head: Normocephalic, atraumatic, sclera non-icteric, no xanthomas, nares are without discharge.  Neck: No carotid bruits. JVD not elevated.  Lungs: Respirations regular and unlabored, without wheezes or rales.  Heart: ***Regular rate and rhythm. No S3 or S4.  No murmur, no rubs, or gallops appreciated. Abdomen: Soft, non-tender, non-distended with normoactive bowel sounds. No hepatomegaly. No rebound/guarding. No obvious abdominal masses. Msk:  Strength and tone appear normal for age. No joint deformities or effusions. Extremities: No clubbing or cyanosis. No edema.  Distal pedal pulses are 2+ bilaterally. Neuro: Alert and oriented X 3. Moves all extremities spontaneously. No focal deficits noted. Psych:  Responds to questions appropriately with a normal affect. Skin: No rashes or lesions noted  Wt Readings from Last 3 Encounters:  08/26/14 290 lb 1.6 oz (131.6 kg)  11/06/13 297 lb (134.7 kg)  09/10/13 287 lb 9.6 oz (130.5 kg)         Studies/Labs Reviewed:   EKG:  EKG is*** ordered today.  The ekg ordered today demonstrates ***  Recent Labs: No results found for requested labs within last 8760 hours.   Lipid Panel    Component Value Date/Time   CHOL 166 08/23/2014 0853   TRIG 274 (H) 08/23/2014 0853   HDL 26 (L) 08/23/2014 0853   CHOLHDL 4.6 08/24/2013 1023   VLDL 38 08/24/2013 1023   LDLCALC 85 08/23/2014 0853    Additional studies/ records that were reviewed today include:   Echocardiogram: 08/2014 Study Conclusions  - Left ventricle: The cavity size was moderately dilated. Wall thickness was increased in a pattern of mild LVH. Systolic function was normal. The estimated ejection fraction was in the range of 60% to 65%. - Aortic valve: AV prosthesis is difficult to see. Peak and mean gradients through ther valve are 47 and 27 mm resepctively This is a mild increase from that reproted in echo from 11/2012. There was trivial regurgitation. - Mitral valve: There was mild regurgitation. - Left atrium: The atrium was moderately dilated. - Right atrium: The atrium was moderately dilated.  Assessment:    No diagnosis found.   Plan:   In order of problems listed above:  1. ***    Medication Adjustments/Labs and Tests Ordered: Current medicines are reviewed at length with the patient today.  Concerns regarding medicines are outlined above.  Medication changes, Labs and Tests ordered today are listed in the Patient Instructions below. There are no Patient Instructions on file for this visit.   Signed, Erma Heritage, PA-C  05/21/2017 4:04 PM    [facility] [place], [place], [place]  [address] Phone: [phone]; Fax: [phone]  [address], [place], [place] [address] Phone: [phone]

## 2017-05-22 ENCOUNTER — Ambulatory Visit: Payer: Medicare Other | Admitting: Student

## 2017-05-23 DIAGNOSIS — I059 Rheumatic mitral valve disease, unspecified: Secondary | ICD-10-CM | POA: Diagnosis not present

## 2017-05-23 DIAGNOSIS — E559 Vitamin D deficiency, unspecified: Secondary | ICD-10-CM | POA: Diagnosis not present

## 2017-05-23 DIAGNOSIS — R197 Diarrhea, unspecified: Secondary | ICD-10-CM | POA: Diagnosis not present

## 2017-05-23 DIAGNOSIS — E118 Type 2 diabetes mellitus with unspecified complications: Secondary | ICD-10-CM | POA: Diagnosis not present

## 2017-05-27 DIAGNOSIS — E118 Type 2 diabetes mellitus with unspecified complications: Secondary | ICD-10-CM | POA: Diagnosis not present

## 2017-05-27 DIAGNOSIS — E789 Disorder of lipoprotein metabolism, unspecified: Secondary | ICD-10-CM | POA: Diagnosis not present

## 2017-05-28 DIAGNOSIS — I482 Chronic atrial fibrillation: Secondary | ICD-10-CM | POA: Diagnosis not present

## 2017-05-28 DIAGNOSIS — Z7901 Long term (current) use of anticoagulants: Secondary | ICD-10-CM | POA: Diagnosis not present

## 2017-05-28 DIAGNOSIS — E118 Type 2 diabetes mellitus with unspecified complications: Secondary | ICD-10-CM | POA: Diagnosis not present

## 2017-06-02 NOTE — Progress Notes (Signed)
Cardiology Office Note    Date:  06/03/2017   ID:  Joseph, Hoffman 12-04-1950, MRN [id number]  PCP:  Anda Kraft, MD  Cardiologist: Dr. Claiborne Billings   Chief Complaint  Patient presents with  . Follow-up    Seen for Dr. Claiborne Billings - last visit in 08/2014    History of Present Illness:    Joseph Hoffman is a 66 y.o. male with past medical history of permanent atrial fibrillation (on Coumadin), Type 2 DM, HTN, HLD, Type 2 DM, OSA (on CPAP) and severe AS (s/p AVR in 09/2012) who presents to the office for follow-up.   He was last examined by Dr. Claiborne Billings in 08/2014 and reported doing well from a cardiac perspective at that time. Denied any recent chest pain or dyspnea on exertion. He did take PRN Lasix for lower extremity edema. He was continued on his current medication regimen with continued diet and exercise encouraged.   In talking with the patient today, he reports being referred back to Cardiology for clearance in regards to an upcoming colonoscopy. He is on Coumadin for atrial fibrillation along with his mechanical valve. Denies any evidence of active bleeding with this. INR is followed by his PCP. He had been experiencing episodes of diarrhea and left upper quadrant pain, therefore GI recommended a colonoscopy for further evaluation.  He denies any recent chest pain, palpitations, orthopnea, PND, lightheadedness, dizziness, or presyncope. Has experienced episodes of dyspnea on exertion and edema, saying he was evaluated by his PCP and diagnosed with a CHF exacerbation last fall. He doubled his Lasix dosing from 58m daily to 422mdaily with resolution of his symptoms within a few days. An echocardiogram was not performed at that time.   He remains on Digoxin for rate-control. No longer takes Lopressor secondary to dizziness he experienced with this.   Past Medical History:  Diagnosis Date  . Anxiety   . Aortic stenosis   . Arthritis   . Asthma   . Asymptomatic LV dysfunction, EF 45%,  normal coronary arteries on cardiac cath 09/18/12 09/30/2012  . [place] (congestive heart failure) ([place]  . Chronic anticoagulation, on Xarelto prior to admit 09/30/2012  . Diabetes mellitus without complication ([facility]  . DM (diabetes mellitus) ([facility]/29/2013  . Dysrhythmia    a fib  . Heart murmur   . Hepatitis 2003  . Hypertension   . [place]ocardial infarction ([place]  . Permanent atrial fibrillation, since 1994 09/30/2012   stress test 02/08/12- normal study, no significant ischemia  . S/P AVR (aortic valve replacement), 09/30/2012   a. s/p mechcanical AVR in 09/2012 (on Coumadin)  . Shortness of breath   . Sleep apnea    uses CPAP    Past Surgical History:  Procedure Laterality Date  . ABDOMINAL ANGIOGRAM  09/18/2012   Procedure: ABDOMINAL ANGIOGRAM;  Surgeon: ThTroy SineMD;  [facility];  Service: Cardiovascular;;  . AORTIC VALVE REPLACEMENT  09/29/2012   Procedure: AORTIC VALVE REPLACEMENT (AVR);  Surgeon: BrGaye PollackMD;  [facility] Service: Open Heart Surgery;  Laterality: N/A;  . ARCH AORTOGRAM  09/18/2012   Procedure: ARCH AORTOGRAM;  Surgeon: ThTroy SineMD;  [facility];  Service: Cardiovascular;;  . CARDIAC CATHETERIZATION  09/18/12   severe calcific aortic stenosis, peak gradient 6590mmean gradient 70m33mF 45%  . CARDIAC VALVE REPLACEMENT     AVR 09-29-12  . CHOLECYSTECTOMY    . FRACTURE SURGERY    .  LEFT AND RIGHT HEART CATHETERIZATION WITH CORONARY ANGIOGRAM N/A 09/18/2012   Procedure: LEFT AND RIGHT HEART CATHETERIZATION WITH CORONARY ANGIOGRAM;  Surgeon: Troy Sine, MD;  [facility];  Service: Cardiovascular;  Laterality: N/A;    Current Medications: Outpatient Medications Prior to Visit  Medication Sig Dispense Refill  . acetaminophen (TYLENOL) 325 MG tablet Take 2 tablets (650 mg total) by mouth every 4 (four) hours as needed for pain or fever.    [name] albuterol (PROVENTIL HFA;VENTOLIN HFA) 108 (90 BASE)  MCG/ACT inhaler Inhale 2 puffs into the lungs every 6 (six) hours as needed. For shortness of breath    . Ascorbic Acid (VITAMIN C) 1000 MG tablet Take 1,000 mg by mouth daily.    . B-D ULTRAFINE III SHORT PEN 31G X 8 MM MISC     . betamethasone valerate (VALISONE) 0.1 % cream Apply 1 application topically daily.    . Blood Glucose Monitoring Suppl (BAYER CONTOUR MONITOR) W/DEVICE KIT     . cholecalciferol (VITAMIN D) 1000 UNITS tablet Take 1,000 Units by mouth daily.    . Chromium 200 MCG CAPS Take 200 mcg by mouth daily.    . clotrimazole-betamethasone (LOTRISONE) cream Apply 1 application topically as needed.    . digoxin (LANOXIN) 0.125 MG tablet TAKE 1 TABLET DAILY 90 tablet 1  . EASY TOUCH LANCETS 30G/TWIST MISC     . escitalopram (LEXAPRO) 10 MG tablet Take 10 mg by mouth every evening.     . fish oil-omega-3 fatty acids 1000 MG capsule Take 2 g by mouth daily.    . Fluticasone-Salmeterol (ADVAIR) 250-50 MCG/DOSE AEPB Inhale 1 puff into the lungs daily. For asthma related symptoms    . Glucosamine-Chondroit-Vit C-Mn (GLUCOSAMINE 1500 COMPLEX PO) Take 1,500 mg by mouth daily.    . insulin glargine (LANTUS) 100 UNIT/ML injection Inject 5-20 Units into the skin 2 (two) times daily. 5-20 units at breakfast and dinner depending on blood sugar levels    . irbesartan (AVAPRO) 150 MG tablet Take 300 mg by mouth daily.    . irbesartan (AVAPRO) 300 MG tablet Take 1 tablet (300 mg total) by mouth daily. 90 tablet 3  . Liraglutide (VICTOZA [place]) Inject 1.8 mg into the skin 1 day or 1 dose.    . metFORMIN (GLUCOPHAGE-XR) 500 MG 24 hr tablet Take 2,000 mg by mouth at bedtime.    . metoprolol tartrate (LOPRESSOR) 25 MG tablet Take 1 tablet (25 mg total) by mouth 2 (two) times daily. 180 tablet 3  . Multiple Vitamins-Minerals (CENTRUM SPECIALIST HEART PO) Take 1 tablet by mouth 2 (two) times daily.    . Potassium Gluconate 550 MG TABS Take 1 tablet by mouth daily.    . simvastatin (ZOCOR) 40 MG tablet  Take 1 tablet (40 mg total) by mouth at bedtime. 90 tablet 3  . warfarin (COUMADIN) 10 MG tablet Take 10-15 mg by mouth every evening. 10 mg on Tues, Thurs, Sat and Sun and 12.5 mg on Mon, Wed, Frid    . furosemide (LASIX) 40 MG tablet Take 40 mg by mouth daily.    [name] glyBURIDE (DIABETA) 5 MG tablet Take 5 mg by mouth daily with breakfast.     No facility-administered medications prior to visit.      Allergies:   Iohexol; Niacin and related; Penicillins; and Lasix [furosemide]   Social History   Social History  . Marital status: Married    Spouse name: [name]  . Number of children: 3  .  Years of education: 15+   Occupational History  .  [employer]   Social History Main Topics  . Smoking status: Former Smoker    Packs/day: 0.25    Years: 10.00    Types: Cigarettes    Quit date: 09/25/1982  . Smokeless tobacco: Former Systems developer  . Alcohol use Yes     Comment: occ. beer/wine  . Drug use: No  . Sexual activity: Yes    Birth control/ protection: None   Other Topics Concern  . None   Social History Narrative   Patient is married [name]) and lives with his wife and son.   Patient has three children.   Patient is working full-time.   Patient has a college education.   Patient is right handed.   Patient drinks about 2-3 cups of coffee daily.     Family History:  The patient's family history includes Diabetes in his father; Heart attack (age of onset: 80) in his brother; Hyperlipidemia (age of onset: 54) in his brother; Hypertension in his mother.   Review of Systems:   Please see the history of present illness.     General:  No chills, fever, night sweats or weight changes.  Cardiovascular:  No chest pain, edema, orthopnea, palpitations, paroxysmal nocturnal dyspnea. Positive for dyspnea on exertion.  Dermatological: No rash, lesions/masses Respiratory: No cough, dyspnea Urologic: No hematuria, dysuria Abdominal:   No nausea, vomiting, bright red blood per rectum, melena, or  hematemesis. Positive for diarrhea.  Neurologic:  No visual changes, wkns, changes in mental status. All other systems reviewed and are otherwise negative except as noted above.   Physical Exam:    VS:  BP (!) 151/62   Pulse 71   Ht _0  (1.803 m)   Wt 252 lb 12.8 oz (114.7 kg)   BMI 35.26 kg/m    General: Well developed, well nourished Caucasian male appearing in no acute distress. Head: Normocephalic, atraumatic, sclera non-icteric, no xanthomas, nares are without discharge.  Neck: No carotid bruits. JVD not elevated.  Lungs: Respirations regular and unlabored, without wheezes or rales.  Heart: Irregularly irregular. No S3 or S4.  No murmur, no rubs, or gallops appreciated. Crisp valve sounds appreciated.  Abdomen: Soft, non-tender, non-distended with normoactive bowel sounds. No hepatomegaly. No rebound/guarding. No obvious abdominal masses. Msk:  Strength and tone appear normal for age. No joint deformities or effusions. Extremities: No clubbing or cyanosis. No edema.  Distal pedal pulses are 2+ bilaterally. Neuro: Alert and oriented X 3. Moves all extremities spontaneously. No focal deficits noted. Psych:  Responds to questions appropriately with a normal affect. Skin: No rashes or lesions noted  Wt Readings from Last 3 Encounters:  06/03/17 252 lb 12.8 oz (114.7 kg)  08/26/14 290 lb 1.6 oz (131.6 kg)  11/06/13 297 lb (134.7 kg)     Studies/Labs Reviewed:   EKG:  EKG is ordered today.  The ekg ordered today demonstrates atrial fibrillation, HR 71, RAD, with no acute ST or T-wave changes.   Recent Labs: No results found for requested labs within last 8760 hours.   Lipid Panel    Component Value Date/Time   CHOL 166 08/23/2014 0853   TRIG 274 (H) 08/23/2014 0853   HDL 26 (L) 08/23/2014 0853   CHOLHDL 4.6 08/24/2013 1023   VLDL 38 08/24/2013 1023   LDLCALC 85 08/23/2014 0853    Additional studies/ records that were reviewed today include:   Echocardiogram:  08/2014  Study Conclusions  - Left ventricle:  The cavity size was moderately dilated. Wall thickness was increased in a pattern of mild LVH. Systolic function was normal. The estimated ejection fraction was in the range of 60% to 65%. - Aortic valve: AV prosthesis is difficult to see. Peak and mean gradients through ther valve are 47 and 27 mm resepctively This is a mild increase from that reproted in echo from 11/2012. There was trivial regurgitation. - Mitral valve: There was mild regurgitation. - Left atrium: The atrium was moderately dilated. - Right atrium: The atrium was moderately dilated.  Assessment:    1. Permanent atrial fibrillation, since 1994   2. Current use of long term anticoagulation   3. Severe aortic stenosis   4. Dyspnea on exertion   5. Essential hypertension   6. Hyperlipidemia LDL goal <100   7. Obstructive sleep apnea syndrome   8. Preoperative cardiovascular examination      Plan:   In order of problems listed above:  1. Permanent Atrial Fibrillation/ Long-term Anticoagulation - he has a known history of atrial fibrillation since 1994. He denies any recent chest pain, palpitations, lightheadedness, or presyncope.  - unable to tolerate Lopressor secondary to dizziness. Remains on Digoxin 0.150m daily for rate-control. - on Coumadin for anticoagulation. Plan for Lovenox bridge as below in the setting of his upcoming colonoscopy.   2. Severe AS - s/p mechanical AVR in 2013 - echo in 08/2014 showed peak and mean gradients of 47 and 27 mm resepctively . - plan for repeat echocardiogram to assess valve function.   3. Dyspnea on Exertion - he reports episodes of dyspnea on exertion and edema, with the most recent being last fall at which time he had to take an increased Lasix dose.  - will recheck an echocardiogram as above to reassess EF and valve gradients.   4. HTN - BP elevated at 151/62, reports SBP is in the 120's - 130's when  checked at home.  - encouraged him to continue to check his BP regularly. Will not further titrate his medications at this time with history of orthostatic hypotension and dizziness.  - continue Irbesartan 3031mdaily.   5. HLD - Lipid Panel in 05/2017 showed total cholesterol of 167, HDL 34, and LDL 83. - at goal with LDL < 100 with known Type 2 DM. No history of CAD. - followed by his PCP. No longer on statin therapy.   6. OSA - continued compliance with CPAP encouraged.   7. Preoperative Cardiac Clearance - he is planning to undergo a colonoscopy due to diarrhea and LUQ pain. - he will require a Lovenox bridge in the setting of his mechanical valve.  - INR is followed by his PCP and they will manage his bridging. I provided our office number if guidance by the Coumadin Clinic is needed with this.    Medication Adjustments/Labs and Tests Ordered: Current medicines are reviewed at length with the patient today.  Concerns regarding medicines are outlined above.  Medication changes, Labs and Tests ordered today are listed in the Patient Instructions below. Patient Instructions  Medication Instructions:  Your physician recommends that you continue on your current medications as directed. Please refer to the Current Medication list given to you today.  Labwork: NONE  Testing/Procedures: Your physician has requested that you have an echocardiogram. Echocardiography is a painless test that uses sound waves to create images of your heart. It provides your doctor with information about the size and shape of your heart and how well your heart's  chambers and valves are working. This procedure takes approximately one hour. There are no restrictions for this procedure. -this will be done at our [facility] location: [place]: Your physician wants you to follow-up in: 1 YEAR with Dr. Claiborne Billings. You will receive a reminder letter in the mail two months in advance. If  you don't receive a letter, please call our office to schedule the follow-up appointment.  Any Other Special Instructions Will Be Listed Below (If Applicable).  You will need a lovenox bridge for your upcoming procedure-please contact your coumadin clinic to see if they can manage this.  If not, please call our office.  If you need a refill on your cardiac medications before your next appointment, please call your pharmacy.    Signed, Erma Heritage, PA-C  06/03/2017 8:03 PM    [facility] [place], [place], [place]  [address] Phone: [phone]; Fax: [phone]  [address] [place], [place] [address] Phone: [phone]

## 2017-06-03 ENCOUNTER — Encounter: Payer: Self-pay | Admitting: Student

## 2017-06-03 ENCOUNTER — Ambulatory Visit (INDEPENDENT_AMBULATORY_CARE_PROVIDER_SITE_OTHER): Payer: Medicare Other | Admitting: Student

## 2017-06-03 VITALS — BP 151/62 | HR 71 | Ht 71.0 in | Wt 252.8 lb

## 2017-06-03 DIAGNOSIS — G4733 Obstructive sleep apnea (adult) (pediatric): Secondary | ICD-10-CM

## 2017-06-03 DIAGNOSIS — I350 Nonrheumatic aortic (valve) stenosis: Secondary | ICD-10-CM | POA: Diagnosis not present

## 2017-06-03 DIAGNOSIS — Z01810 Encounter for preprocedural cardiovascular examination: Secondary | ICD-10-CM | POA: Diagnosis not present

## 2017-06-03 DIAGNOSIS — I482 Chronic atrial fibrillation: Secondary | ICD-10-CM | POA: Diagnosis not present

## 2017-06-03 DIAGNOSIS — E785 Hyperlipidemia, unspecified: Secondary | ICD-10-CM

## 2017-06-03 DIAGNOSIS — Z7901 Long term (current) use of anticoagulants: Secondary | ICD-10-CM

## 2017-06-03 DIAGNOSIS — I10 Essential (primary) hypertension: Secondary | ICD-10-CM | POA: Diagnosis not present

## 2017-06-03 DIAGNOSIS — I4821 Permanent atrial fibrillation: Secondary | ICD-10-CM

## 2017-06-03 DIAGNOSIS — R0609 Other forms of dyspnea: Secondary | ICD-10-CM | POA: Diagnosis not present

## 2017-06-03 NOTE — Patient Instructions (Signed)
Medication Instructions:  Your physician recommends that you continue on your current medications as directed. Please refer to the Current Medication list given to you today.  Labwork: NONE  Testing/Procedures: Your physician has requested that you have an echocardiogram. Echocardiography is a painless test that uses sound waves to create images of your heart. It provides your doctor with information about the size and shape of your heart and how well your heart's chambers and valves are working. This procedure takes approximately one hour. There are no restrictions for this procedure. -this will be done at our [facility] location: [place]: Your physician wants you to follow-up in: 1 YEAR with Dr. Claiborne Billings. You will receive a reminder letter in the mail two months in advance. If you don't receive a letter, please call our office to schedule the follow-up appointment.   Any Other Special Instructions Will Be Listed Below (If Applicable).  You will need a lovenox bridge for your upcoming procedure-please contact your coumadin clinic to see if they can manage this.  If not, please call our office.  If you need a refill on your cardiac medications before your next appointment, please call your pharmacy.

## 2017-06-04 DIAGNOSIS — I482 Chronic atrial fibrillation: Secondary | ICD-10-CM | POA: Diagnosis not present

## 2017-06-04 DIAGNOSIS — E118 Type 2 diabetes mellitus with unspecified complications: Secondary | ICD-10-CM | POA: Diagnosis not present

## 2017-06-04 DIAGNOSIS — Z7901 Long term (current) use of anticoagulants: Secondary | ICD-10-CM | POA: Diagnosis not present

## 2017-06-11 DIAGNOSIS — E119 Type 2 diabetes mellitus without complications: Secondary | ICD-10-CM | POA: Diagnosis not present

## 2017-06-12 DIAGNOSIS — Z1211 Encounter for screening for malignant neoplasm of colon: Secondary | ICD-10-CM | POA: Diagnosis not present

## 2017-06-12 DIAGNOSIS — K648 Other hemorrhoids: Secondary | ICD-10-CM | POA: Diagnosis not present

## 2017-06-12 DIAGNOSIS — K5730 Diverticulosis of large intestine without perforation or abscess without bleeding: Secondary | ICD-10-CM | POA: Diagnosis not present

## 2017-06-13 ENCOUNTER — Other Ambulatory Visit (HOSPITAL_COMMUNITY): Payer: Medicare Other

## 2017-06-18 DIAGNOSIS — E118 Type 2 diabetes mellitus with unspecified complications: Secondary | ICD-10-CM | POA: Diagnosis not present

## 2017-06-20 ENCOUNTER — Ambulatory Visit (HOSPITAL_COMMUNITY): Payer: Medicare Other

## 2017-06-24 DIAGNOSIS — I482 Chronic atrial fibrillation: Secondary | ICD-10-CM | POA: Diagnosis not present

## 2017-06-24 DIAGNOSIS — Z7901 Long term (current) use of anticoagulants: Secondary | ICD-10-CM | POA: Diagnosis not present

## 2017-07-04 ENCOUNTER — Other Ambulatory Visit (HOSPITAL_COMMUNITY): Payer: Medicare Other

## 2017-07-10 ENCOUNTER — Telehealth (HOSPITAL_COMMUNITY): Payer: Self-pay | Admitting: Student

## 2017-07-10 NOTE — Telephone Encounter (Signed)
Patient cancelled appts on 7/12 and 7/19. He also no-showed for appt on 8/2.   User: Cherie Dark A Date/time: 06/27/2017 1:38 PM  Comment: Called pt and lmsg for him to CB to r/s echo.   Context: [place] Schedule Orders/Appt Requests Outcome: Left Message  Phone number: [phone] Phone Type: Mobile  Comm. type: Telephone Call type: Outgoing  Contact: [name] Relation to patient: Self  Letter:       Patient will be removed from the workqueue.

## 2017-07-16 DIAGNOSIS — R110 Nausea: Secondary | ICD-10-CM | POA: Diagnosis not present

## 2017-07-16 DIAGNOSIS — R197 Diarrhea, unspecified: Secondary | ICD-10-CM | POA: Diagnosis not present

## 2017-07-18 DIAGNOSIS — R1084 Generalized abdominal pain: Secondary | ICD-10-CM | POA: Diagnosis not present

## 2017-07-18 DIAGNOSIS — R110 Nausea: Secondary | ICD-10-CM | POA: Diagnosis not present

## 2017-07-18 DIAGNOSIS — R5383 Other fatigue: Secondary | ICD-10-CM | POA: Diagnosis not present

## 2017-07-18 DIAGNOSIS — R197 Diarrhea, unspecified: Secondary | ICD-10-CM | POA: Diagnosis not present

## 2017-07-31 DIAGNOSIS — K449 Diaphragmatic hernia without obstruction or gangrene: Secondary | ICD-10-CM | POA: Diagnosis not present

## 2017-07-31 DIAGNOSIS — R1084 Generalized abdominal pain: Secondary | ICD-10-CM | POA: Diagnosis not present

## 2017-07-31 DIAGNOSIS — R110 Nausea: Secondary | ICD-10-CM | POA: Diagnosis not present

## 2017-08-06 ENCOUNTER — Encounter: Payer: Self-pay | Admitting: Cardiovascular Disease

## 2017-08-08 DIAGNOSIS — Z7901 Long term (current) use of anticoagulants: Secondary | ICD-10-CM | POA: Diagnosis not present

## 2017-08-08 DIAGNOSIS — E118 Type 2 diabetes mellitus with unspecified complications: Secondary | ICD-10-CM | POA: Diagnosis not present

## 2017-08-08 DIAGNOSIS — I482 Chronic atrial fibrillation: Secondary | ICD-10-CM | POA: Diagnosis not present

## 2017-08-13 ENCOUNTER — Other Ambulatory Visit (HOSPITAL_COMMUNITY): Payer: Medicare Other

## 2017-08-13 DIAGNOSIS — R0602 Shortness of breath: Secondary | ICD-10-CM | POA: Diagnosis not present

## 2017-08-13 DIAGNOSIS — F329 Major depressive disorder, single episode, unspecified: Secondary | ICD-10-CM | POA: Diagnosis not present

## 2017-08-13 DIAGNOSIS — Z794 Long term (current) use of insulin: Secondary | ICD-10-CM | POA: Diagnosis not present

## 2017-08-13 DIAGNOSIS — I4891 Unspecified atrial fibrillation: Secondary | ICD-10-CM | POA: Diagnosis not present

## 2017-08-13 DIAGNOSIS — R7989 Other specified abnormal findings of blood chemistry: Secondary | ICD-10-CM | POA: Diagnosis not present

## 2017-08-13 DIAGNOSIS — M1990 Unspecified osteoarthritis, unspecified site: Secondary | ICD-10-CM | POA: Diagnosis not present

## 2017-08-13 DIAGNOSIS — I10 Essential (primary) hypertension: Secondary | ICD-10-CM | POA: Diagnosis not present

## 2017-08-13 DIAGNOSIS — Z9989 Dependence on other enabling machines and devices: Secondary | ICD-10-CM | POA: Diagnosis not present

## 2017-08-13 DIAGNOSIS — E872 Acidosis: Secondary | ICD-10-CM | POA: Diagnosis not present

## 2017-08-13 DIAGNOSIS — R748 Abnormal levels of other serum enzymes: Secondary | ICD-10-CM | POA: Diagnosis not present

## 2017-08-13 DIAGNOSIS — G4733 Obstructive sleep apnea (adult) (pediatric): Secondary | ICD-10-CM | POA: Diagnosis not present

## 2017-08-13 DIAGNOSIS — Z87891 Personal history of nicotine dependence: Secondary | ICD-10-CM | POA: Diagnosis not present

## 2017-08-13 DIAGNOSIS — Z952 Presence of prosthetic heart valve: Secondary | ICD-10-CM | POA: Diagnosis not present

## 2017-08-13 DIAGNOSIS — D649 Anemia, unspecified: Secondary | ICD-10-CM | POA: Diagnosis not present

## 2017-08-13 DIAGNOSIS — Z880 Allergy status to penicillin: Secondary | ICD-10-CM | POA: Diagnosis not present

## 2017-08-13 DIAGNOSIS — E1165 Type 2 diabetes mellitus with hyperglycemia: Secondary | ICD-10-CM | POA: Diagnosis not present

## 2017-08-13 DIAGNOSIS — I509 Heart failure, unspecified: Secondary | ICD-10-CM | POA: Diagnosis not present

## 2017-08-13 DIAGNOSIS — Z79899 Other long term (current) drug therapy: Secondary | ICD-10-CM | POA: Diagnosis not present

## 2017-08-13 DIAGNOSIS — Z7901 Long term (current) use of anticoagulants: Secondary | ICD-10-CM | POA: Diagnosis not present

## 2017-08-13 DIAGNOSIS — Z7984 Long term (current) use of oral hypoglycemic drugs: Secondary | ICD-10-CM | POA: Diagnosis not present

## 2017-08-13 DIAGNOSIS — E119 Type 2 diabetes mellitus without complications: Secondary | ICD-10-CM | POA: Diagnosis not present

## 2017-08-14 DIAGNOSIS — I083 Combined rheumatic disorders of mitral, aortic and tricuspid valves: Secondary | ICD-10-CM | POA: Diagnosis not present

## 2017-08-14 DIAGNOSIS — I517 Cardiomegaly: Secondary | ICD-10-CM | POA: Diagnosis not present

## 2017-08-14 DIAGNOSIS — I5031 Acute diastolic (congestive) heart failure: Secondary | ICD-10-CM | POA: Diagnosis not present

## 2017-08-21 ENCOUNTER — Ambulatory Visit (INDEPENDENT_AMBULATORY_CARE_PROVIDER_SITE_OTHER): Payer: Medicare Other | Admitting: Cardiovascular Disease

## 2017-08-21 ENCOUNTER — Other Ambulatory Visit (HOSPITAL_COMMUNITY): Payer: Medicare Other

## 2017-08-21 VITALS — BP 140/60 | HR 92 | Ht 68.0 in | Wt 245.0 lb

## 2017-08-21 DIAGNOSIS — Z952 Presence of prosthetic heart valve: Secondary | ICD-10-CM

## 2017-08-21 DIAGNOSIS — I10 Essential (primary) hypertension: Secondary | ICD-10-CM | POA: Diagnosis not present

## 2017-08-21 DIAGNOSIS — E119 Type 2 diabetes mellitus without complications: Secondary | ICD-10-CM

## 2017-08-21 DIAGNOSIS — Z794 Long term (current) use of insulin: Secondary | ICD-10-CM | POA: Diagnosis not present

## 2017-08-21 DIAGNOSIS — Z7901 Long term (current) use of anticoagulants: Secondary | ICD-10-CM

## 2017-08-21 DIAGNOSIS — I509 Heart failure, unspecified: Secondary | ICD-10-CM | POA: Diagnosis not present

## 2017-08-21 DIAGNOSIS — I482 Chronic atrial fibrillation: Secondary | ICD-10-CM | POA: Diagnosis not present

## 2017-08-21 DIAGNOSIS — I4821 Permanent atrial fibrillation: Secondary | ICD-10-CM

## 2017-08-21 MED ORDER — METOPROLOL SUCCINATE ER 25 MG PO TB24: 12.5000 mg | ORAL_TABLET | Freq: Every day | ORAL | 0 refills | Status: DC

## 2017-08-21 MED ORDER — EMPAGLIFLOZIN 10 MG PO TABS: 10.0000 mg | ORAL_TABLET | Freq: Every day | ORAL | 3 refills | Status: DC

## 2017-08-21 MED ORDER — FUROSEMIDE 40 MG PO TABS: 40.0000 mg | ORAL_TABLET | Freq: Every day | ORAL | 3 refills | Status: DC

## 2017-08-21 MED ORDER — METOPROLOL SUCCINATE ER 25 MG PO TB24: 12.5000 mg | ORAL_TABLET | Freq: Every day | ORAL | 3 refills | Status: DC

## 2017-08-21 NOTE — Patient Instructions (Signed)
Medication Instructions:  START metoprolol succinate (Toprol XL) 12.5 mg (1/2 tablet) daily  START Jardiance 10mg  daily   Change Lasix to 40mg  daily  Labwork: Please return for FASTING labs in 3-4 weeks (CMET, TSH, Hmg A1C).  Lab Hours: 8:30-4:30 closed for lunch 12:30-2, no appointment needed.  Testing/Procedures: NONE  Follow-Up: Your physician recommends that you schedule a follow-up appointment in: 6 weeks with Dr. Claiborne Billings   Any Other Special Instructions Will Be Listed Below (If Applicable).     If you need a refill on your cardiac medications before your next appointment, please call your pharmacy.

## 2017-08-21 NOTE — Progress Notes (Signed)
Patient ID: Joseph Hoffman, male   DOB: 09-May-1951, 66 y.o.   MRN: [id number]        HPI: JAVONNE DORKO, is a 66 y.o. male  who presents for a 3 year followup cardiology evaluation.  Mr. Zagami has a history of permanent atrial fibrillation dating back to 3. He has a history of type 2 diabetes mellitus. He developed progressive aortic valve stenosis and 09/29/2012 underwent St. Jude aortic valve replacement by [name]. Additional problems also include hypertension, obstructive sleep apnea on CPAP and last year he obtained a new CPAP machine.  He had some transient CHF symptoms after his Lasix had been held following his surgery. He sees Dr. Wilson Singer for his diabetes.   An echo Doppler study in October 2013.  Following his surgery showed an EF of 55-60% and a St. Jude aortic valve was well seated with peak and mean gradients of 30 and 22 mm.  There was no mention of aortic insufficiency.  When I last saw him, his weight had been fairly stable  but he's been unable to lose additional weight.  AF, rate was controlled.  He had noted some intermittent ankle edema.  Since I last saw 3 years ago he retired in December 2016.  He was seen in July 2018 by Brittney straighter for preoperative clearance prior to undergoing colonoscopy.  He has been on Coumadin both for atrial fibrillation as well as his mechanical valve.  He was recently hospitalized overnight on September 11 and discharged on 08/14/2017 at [facility] in [place]. I reviewed these records.  He presented with acute on chronic CHF exacerbation and received IV Lasix.  His troponin was mildly increased secondary to acute onset CHF exacerbation.  His blood sugar was elevated.  He was discharged following day.  NTproBNP was markedly elevated at 1574. He underwent an echo Doppler study which showed an EF of 50-55%.  The mitral valve leaflets were calcified and there was 2+ MR.  His prosthetic aortic valve was not well visualized and there  was 2+ moderate aortic regurgitation and moderate tricuspid regurgitation.  He was in atrial fibrillation.    He now presents for cardiology follow-up evaluation.   Past Medical History:  Diagnosis Date  . Anxiety   . Aortic stenosis   . Arthritis   . Asthma   . Asymptomatic LV dysfunction, EF 45%, normal coronary arteries on cardiac cath 09/18/12 09/30/2012  . CHF (congestive heart failure) ([place])   . Chronic anticoagulation, on Xarelto prior to admit 09/30/2012  . Diabetes mellitus without complication ([place])   . DM (diabetes mellitus) ([place]) 09/30/2012  . Dysrhythmia    a fib  . Heart murmur   . Hepatitis 2003  . Hypertension   . Myocardial infarction ([place])   . Permanent atrial fibrillation, since 1994 09/30/2012   stress test 02/08/12- normal study, no significant ischemia  . S/P AVR (aortic valve replacement), 09/30/2012   a. s/p mechcanical AVR in 09/2012 (on Coumadin)  . Shortness of breath   . Sleep apnea    uses CPAP    Past Surgical History:  Procedure Laterality Date  . ABDOMINAL ANGIOGRAM  09/18/2012   Procedure: ABDOMINAL ANGIOGRAM;  Surgeon: Troy Sine, MD;  Location[facility] CATH LAB;  Service: Cardiovascular;;  . AORTIC VALVE REPLACEMENT  09/29/2012   Procedure: AORTIC VALVE REPLACEMENT (AVR);  Surgeon: Gaye Pollack, MD;  Location: [facility];  Service: Open Heart Surgery;  Laterality: N/A;  . ARCH AORTOGRAM  09/18/2012  Procedure: ARCH AORTOGRAM;  Surgeon: Troy Sine, MD;  Location: [facility] CATH LAB;  Service: Cardiovascular;;  . CARDIAC CATHETERIZATION  09/18/12   severe calcific aortic stenosis, peak gradient 52m, mean gradient 572m EF 45%  . CARDIAC VALVE REPLACEMENT     AVR 09-29-12  . CHOLECYSTECTOMY    . FRACTURE SURGERY    . LEFT AND RIGHT HEART CATHETERIZATION WITH CORONARY ANGIOGRAM N/A 09/18/2012   Procedure: LEFT AND RIGHT HEART CATHETERIZATION WITH CORONARY ANGIOGRAM;  Surgeon: ThTroy SineMD;  [facility];  Service: Cardiovascular;   Laterality: N/A;    Allergies  Allergen Reactions  . Iohexol Anaphylaxis  . Niacin And Related     Flushing with immediate realese  . Penicillins Other (See Comments)    Unknown..aMarland Kitchenrtic stenosis a child  . Lasix [Furosemide] Rash    Current Outpatient Prescriptions  Medication Sig Dispense Refill  . acetaminophen (TYLENOL) 325 MG tablet Take 2 tablets (650 mg total) by mouth every 4 (four) hours as needed for pain or fever.    . Marland Kitchenlbuterol (PROVENTIL HFA;VENTOLIN HFA) 108 (90 BASE) MCG/ACT inhaler Inhale 2 puffs into the lungs every 6 (six) hours as needed. For shortness of breath    . Ascorbic Acid (VITAMIN C) 1000 MG tablet Take 1,000 mg by mouth daily.    . B-D ULTRAFINE III SHORT PEN 31G X 8 MM MISC     . betamethasone valerate (VALISONE) 0.1 % cream Apply 1 application topically daily.    . Blood Glucose Monitoring Suppl (BAYER CONTOUR MONITOR) W/DEVICE KIT     . cholecalciferol (VITAMIN D) 1000 UNITS tablet Take 1,000 Units by mouth daily.    . Chromium 200 MCG CAPS Take 200 mcg by mouth daily.    . clotrimazole-betamethasone (LOTRISONE) cream Apply 1 application topically as needed.    . digoxin (LANOXIN) 0.125 MG tablet TAKE 1 TABLET DAILY 90 tablet 1  . EASY TOUCH LANCETS 30G/TWIST MISC     . escitalopram (LEXAPRO) 10 MG tablet Take 10 mg by mouth every evening.     . fish oil-omega-3 fatty acids 1000 MG capsule Take 2 g by mouth daily.    . Fluticasone-Salmeterol (ADVAIR) 250-50 MCG/DOSE AEPB Inhale 1 puff into the lungs daily. For asthma related symptoms    . furosemide (LASIX) 40 MG tablet Take 1 tablet (40 mg total) by mouth daily. 90 tablet 3  . glimepiride (AMARYL) 2 MG tablet     . Glucosamine-Chondroit-Vit C-Mn (GLUCOSAMINE 1500 COMPLEX PO) Take 1,500 mg by mouth daily.    . insulin glargine (LANTUS) 100 UNIT/ML injection Inject 5-20 Units into the skin 2 (two) times daily. 5-20 units at breakfast and dinner depending on blood sugar levels    . irbesartan (AVAPRO) 300  MG tablet Take 1 tablet (300 mg total) by mouth daily. 90 tablet 3  . Liraglutide (VICTOZA [place]) Inject 1.8 mg into the skin 1 day or 1 dose.    . metFORMIN (GLUCOPHAGE-XR) 500 MG 24 hr tablet Take 2,000 mg by mouth at bedtime.    . Multiple Vitamins-Minerals (CENTRUM SPECIALIST HEART PO) Take 1 tablet by mouth 2 (two) times daily.    . Potassium Gluconate 550 MG TABS Take 1 tablet by mouth daily.    . Marland Kitchenarfarin (COUMADIN) 10 MG tablet Take 10-15 mg by mouth every evening. 10 mg on Tues, Thurs, Sat and Sun and 12.5 mg on Mon, Wed, Frid    . empagliflozin (JARDIANCE) 10 MG TABS tablet Take 10 mg by  mouth daily. 30 tablet 3  . metoprolol succinate (TOPROL XL) 25 MG 24 hr tablet Take 0.5 tablets (12.5 mg total) by mouth daily. 15 tablet 0   No current facility-administered medications for this visit.     Social History   Social History  . Marital status: Married    Spouse name: [name]  . Number of children: 3  . Years of education: 15+   Occupational History  .  [employer]   Social History Main Topics  . Smoking status: Former Smoker    Packs/day: 0.25    Years: 10.00    Types: Cigarettes    Quit date: 09/25/1982  . Smokeless tobacco: Former Systems developer  . Alcohol use Yes     Comment: occ. beer/wine  . Drug use: No  . Sexual activity: Yes    Birth control/ protection: None   Other Topics Concern  . Not on file   Social History Narrative   Patient is married [name]) and lives with his wife and son.   Patient has three children.   Patient is working full-time.   Patient has a college education.   Patient is right handed.   Patient drinks about 2-3 cups of coffee daily.    Family History  Problem Relation Age of Onset  . Hypertension Mother   . Diabetes Father   . Heart attack Brother 23  . Hyperlipidemia Brother 53       stents placed      ROS General: Negative; No fevers, chills, or night sweats; positive for obesity HEENT: Negative; No changes in vision or hearing,  sinus congestion, difficulty swallowing Pulmonary: Negative; No cough, wheezing, shortness of breath, hemoptysis Cardiovascular: Negative; No chest pain, presyncope, syncope, palpitations Rare left ankle swelling GI: Negative; No nausea, vomiting, diarrhea, or abdominal pain GU: Negative; No dysuria, hematuria, or difficulty voiding Musculoskeletal: Negative; no myalgias, joint pain, or weakness Hematologic/Oncology: Negative; no easy bruising, bleeding Endocrine: Negative; no heat/cold intolerance; no diabetes Neuro: Negative; no changes in balance, headaches Skin: Negative; No rashes or skin lesions Psychiatric: Negative; No behavioral problems, depression Sleep: Negative; No snoring, daytime sleepiness, hypersomnolence, bruxism, restless legs, hypnogognic hallucinations, no cataplexy   PE BP 140/60   Pulse 92   Ht 5' 8"  (1.727 m)   Wt 245 lb (111.1 kg)   BMI 37.25 kg/m    Wt Readings from Last 3 Encounters:  08/21/17 245 lb (111.1 kg)  06/03/17 252 lb 12.8 oz (114.7 kg)  08/26/14 290 lb 1.6 oz (131.6 kg)     General: Alert, oriented, no distress.  Skin: normal turgor, no rashes, warm and dry HEENT: Normocephalic, atraumatic. Pupils equal round and reactive to light; sclera anicteric; extraocular muscles intact;  Nose without nasal septal hypertrophy Mouth/Parynx benign; Mallinpatti scale 3 Neck: No JVD, no carotid bruits; normal carotid upstroke Lungs: clear to ausculatation and percussion; no wheezing or rales Chest wall: without tenderness to palpitation Heart: PMI not displaced, RRR, s1 s2 normal, 0-3/4 systolic murmur, 2+ diastolic murmur compatible with aortic insufficiency;no rubs, gallops, thrills, or heaves,  Abdomen: soft, nontender; no hepatosplenomehaly, BS+; abdominal aorta nontender and not dilated by palpation. Back: no CVA tenderness Pulses 2+ Musculoskeletal: full range of motion, normal strength, no joint deformities Extremities: Trace ankle edema;no  clubbing cyanosis, Homan's sign negative  Neurologic: grossly nonfocal; Cranial nerves grossly wnl Psychologic: Normal mood and affect   ECG (independently read by me): atrial fibrillation with occasional PVCs.  Ventricular rate 92 bpm.  Right axis deviation.  September 2015 ECG (independently read by me): Atrial fibrillation with ventricular response of 57 beats per minute.  Nonspecific intraventricular block.  Right axis deviation.  Prior October 2014 ECG: Atrial fibrillation with ventricular rate in the 70s. Nonspecific interventricular conduction delay. Right superior axis deviation.  LABS: I reviewed the patient's recent [facility] hospitalization, 2-D echo Doppler study, laboratory  BMP Latest Ref Rng & Units 08/23/2014 08/24/2013 10/15/2012  Glucose 70 - 99 mg/dL 295(H) 146(H) 207(H)  BUN 6 - 23 mg/dL 23 21 17   Creatinine 0.50 - 1.35 mg/dL 1.06 1.13 0.86  Sodium 135 - 145 mEq/L 135 137 141  Potassium 3.5 - 5.3 mEq/L 5.3 4.8 4.2  Chloride 96 - 112 mEq/L 100 103 106  CO2 19 - 32 mEq/L 27 27 24   Calcium 8.4 - 10.5 mg/dL 9.3 9.5 9.2   Hepatic Function Latest Ref Rng & Units 08/23/2014 08/24/2013 09/25/2012  Total Protein 6.0 - 8.3 g/dL 6.3 6.6 7.3  Albumin 3.5 - 5.2 g/dL 4.2 4.2 4.1  AST 0 - 37 U/L 23 22 20   ALT 0 - 53 U/L 29 27 20   Alk Phosphatase 39 - 117 U/L 48 49 48  Total Bilirubin 0.2 - 1.2 mg/dL 0.5 0.6 0.4   CBC Latest Ref Rng & Units 08/24/2013 10/12/2012 10/02/2012  WBC 4.0 - 10.5 K/uL 9.5 10.5 11.9(H)  Hemoglobin 13.0 - 17.0 g/dL 13.2 10.5(L) 10.2(L)  Hematocrit 39.0 - 52.0 % 38.1(L) 31.8(L) 30.6(L)  Platelets 150 - 400 K/uL 189 280 123(L)   Lab Results  Component Value Date   MCV 80.9 08/24/2013   MCV 84.8 10/12/2012   MCV 85.7 10/02/2012   Lab Results  Component Value Date   TSH 1.290 11/06/2013   Lipid Panel     Component Value Date/Time   CHOL 166 08/23/2014 0853   TRIG 274 (H) 08/23/2014 0853   HDL 26 (L) 08/23/2014 0853   CHOLHDL 4.6 08/24/2013 1023    VLDL 38 08/24/2013 1023   LDLCALC 85 08/23/2014 0853     RADIOLOGY: No results found.  IMPRESSION:  1. Permanent atrial fibrillation, since 1994   2. Essential hypertension   3. History of aortic valve replacement   4. Type 2 diabetes mellitus without complication, with long-term current use of insulin ([place])   5. Warfarin anticoagulation   6. Acute on chronic congestive heart failure, unspecified heart failure type ([place])   7. Morbid obesity ([place])     ASSESSMENT AND PLAN: Mr.Mcdanel is A 66 year old male who is status post aortic valve replacement in October 2013 with a St. Jude aortic valve by [name].  He has a long-standing history of permanent atrial fibrillation dating back to 1994 and also has a history of hypertension, obstructive sleep apnea on CPAP therapy, and diabetes mellitus with suboptimal control.  I had not seen him in over 3 years.  He was recently hospitalized with CHF symptoms.  His glucose was significantly elevated at 385.  BMP was significantly increased.  He was treated with IV Lasix with good diuresis.  Presently, he is currently taking furosemide 20 g twice a day after having taken 40 mg twice a day for 4 days, digoxin 0.125 mg daily, irbesartan 300 mg.  His blood pressure today was increased at 140/60.  His pulse was in the 90s with atrial fibrillation.  I am recommending initiation of Toprol-XL 12.5 mg daily for improved rate control and blood pressure control.  He has been taking Lasix 20, no grams twice a day and  I have recommended she change this to 40 mg daily.  He is on metformin, victoza, insulin, and Amaryl.  His renal function is stable with a creatinine clearance in excess of 45.  With his heart failure symptomatology, I feel he is a good candidate for Giardia and suggested discussed with him recent data concerning again heart failure reduction benefit.  He had previously seen Dr. Wilson Singer for endocrinology, who is recently retired and has not yet established  with another endocrinologist.  In the future may be possible to stop his sulfonylurea.  I am recommending follow-up laboratory be obtained in a proximally 3 weeks.  I reviewed his echo Doppler data from [place]. He does hve moerate aor associated with his mechanical prosthetic aortic valve.  Repeat laboratory will be obtained with a cemented, hemoglobin A1c, and thyroid function studies.  He continues to be on warfarin for anticoagulation with INR goal and a 2.5-3 range.  He is obese.  Weight loss was recommended.  I will see him in 6 weeks for reevaluation.    Time spent: 40 minutes   Troy Sine, MD, [facility]  08/23/2017 6:13 PM

## 2017-08-23 ENCOUNTER — Encounter: Payer: Self-pay | Admitting: Cardiovascular Disease

## 2017-08-30 NOTE — Telephone Encounter (Signed)
This encounter was created in error - please disregard.

## 2017-09-05 DIAGNOSIS — Z23 Encounter for immunization: Secondary | ICD-10-CM | POA: Diagnosis not present

## 2017-09-05 DIAGNOSIS — Z7901 Long term (current) use of anticoagulants: Secondary | ICD-10-CM | POA: Diagnosis not present

## 2017-09-05 DIAGNOSIS — E118 Type 2 diabetes mellitus with unspecified complications: Secondary | ICD-10-CM | POA: Diagnosis not present

## 2017-09-05 DIAGNOSIS — I482 Chronic atrial fibrillation: Secondary | ICD-10-CM | POA: Diagnosis not present

## 2017-09-19 DIAGNOSIS — Z7901 Long term (current) use of anticoagulants: Secondary | ICD-10-CM | POA: Diagnosis not present

## 2017-09-19 DIAGNOSIS — E118 Type 2 diabetes mellitus with unspecified complications: Secondary | ICD-10-CM | POA: Diagnosis not present

## 2017-09-19 DIAGNOSIS — I482 Chronic atrial fibrillation: Secondary | ICD-10-CM | POA: Diagnosis not present

## 2017-10-03 DIAGNOSIS — E118 Type 2 diabetes mellitus with unspecified complications: Secondary | ICD-10-CM | POA: Diagnosis not present

## 2017-10-03 DIAGNOSIS — I482 Chronic atrial fibrillation: Secondary | ICD-10-CM | POA: Diagnosis not present

## 2017-10-03 DIAGNOSIS — Z7901 Long term (current) use of anticoagulants: Secondary | ICD-10-CM | POA: Diagnosis not present

## 2017-10-04 ENCOUNTER — Other Ambulatory Visit: Payer: Self-pay | Admitting: Cardiovascular Disease

## 2017-10-04 ENCOUNTER — Encounter: Payer: Self-pay | Admitting: Cardiovascular Disease

## 2017-10-04 ENCOUNTER — Ambulatory Visit (INDEPENDENT_AMBULATORY_CARE_PROVIDER_SITE_OTHER): Payer: Medicare Other | Admitting: Cardiovascular Disease

## 2017-10-04 VITALS — BP 130/62 | HR 67 | Ht 68.0 in | Wt 247.0 lb

## 2017-10-04 DIAGNOSIS — Z952 Presence of prosthetic heart valve: Secondary | ICD-10-CM

## 2017-10-04 DIAGNOSIS — I509 Heart failure, unspecified: Secondary | ICD-10-CM

## 2017-10-04 DIAGNOSIS — Z794 Long term (current) use of insulin: Secondary | ICD-10-CM

## 2017-10-04 DIAGNOSIS — I482 Chronic atrial fibrillation: Secondary | ICD-10-CM

## 2017-10-04 DIAGNOSIS — E119 Type 2 diabetes mellitus without complications: Secondary | ICD-10-CM

## 2017-10-04 DIAGNOSIS — E785 Hyperlipidemia, unspecified: Secondary | ICD-10-CM | POA: Diagnosis not present

## 2017-10-04 DIAGNOSIS — Z7901 Long term (current) use of anticoagulants: Secondary | ICD-10-CM

## 2017-10-04 DIAGNOSIS — I10 Essential (primary) hypertension: Secondary | ICD-10-CM | POA: Diagnosis not present

## 2017-10-04 DIAGNOSIS — I350 Nonrheumatic aortic (valve) stenosis: Secondary | ICD-10-CM

## 2017-10-04 DIAGNOSIS — Z79899 Other long term (current) drug therapy: Secondary | ICD-10-CM

## 2017-10-04 DIAGNOSIS — I4821 Permanent atrial fibrillation: Secondary | ICD-10-CM

## 2017-10-04 MED ORDER — METOPROLOL SUCCINATE ER 25 MG PO TB24: 12.5000 mg | ORAL_TABLET | Freq: Every day | ORAL | 3 refills | Status: DC

## 2017-10-04 MED ORDER — EMPAGLIFLOZIN 10 MG PO TABS: 10.0000 mg | ORAL_TABLET | Freq: Every day | ORAL | 3 refills | Status: DC

## 2017-10-04 NOTE — Progress Notes (Signed)
Patient ID: Joseph Hoffman, male   DOB: [birth year], 66 y.o.   MRN: [id number]        HPI: Joseph Hoffman, is a 66 y.o. male  who presents for a 2 month followup cardiology evaluation.  Joseph Hoffman has a history of permanent atrial fibrillation dating back to 64. He has a history of type 2 diabetes mellitus. He developed progressive aortic valve stenosis and 09/29/2012 underwent St. Jude aortic valve replacement by Joseph Hoffman. Additional problems also include hypertension, obstructive sleep apnea on CPAP and last year he obtained a new CPAP machine.  He had some transient CHF symptoms after his Lasix had been held following his surgery. He sees Dr. Wilson Singer for his diabetes.   An echo Doppler study in October 2013.  Following his surgery showed an EF of 55-60% and a St. Jude aortic valve was well seated with peak and mean gradients of 30 and 22 mm.  There was no mention of aortic insufficiency.  When I last saw him, his weight had been fairly stable  but he's been unable to lose additional weight.  AF, rate was controlled.  He had noted some intermittent ankle edema.  Since I last saw 3 years ago he retired in December 2016.  He was seen in July 2018 by Brittney straighter for preoperative clearance prior to undergoing colonoscopy.  He has been on Coumadin both for atrial fibrillation as well as his mechanical valve.  He was recently hospitalized overnight on September 11 and discharged on 08/14/2017 at [facility] in [place]. I reviewed these records.  He presented with acute on chronic CHF exacerbation and received IV Lasix.  His troponin was mildly increased secondary to acute onset CHF exacerbation.  His blood sugar was elevated.  He was discharged following day.  NTproBNP was markedly elevated at 1574. He underwent an echo Doppler study which showed an EF of 50-55%.  The mitral valve leaflets were calcified and there was 2+ MR.  His prosthetic aortic valve was not well visualized and there  was 2+ moderate aortic regurgitation and moderate tricuspid regurgitation.  He was in atrial fibrillation.    After not seeing him in over 3 years, I saw him for evaluation in over 2018 following his hospitalization.  He has permanent atrial fibrillation and I recommended initiation of Toprol-XL will 0.5 mg daily for improved rate control and blood pressure control.  With his stable renal function.  I felt he was a good candidate for Jardiance with reference to his diabetes mellitus and particularly with his recent CHF and initiated therapy with 10 mg.  I reviewed his echo Doppler data.  He presents for follow-up evaluation.   Past Medical History:  Diagnosis Date  . Anxiety   . Aortic stenosis   . Arthritis   . Asthma   . Asymptomatic LV dysfunction, EF 45%, normal coronary arteries on cardiac cath 09/18/12 09/30/2012  . CHF (congestive heart failure) ([place])   . Chronic anticoagulation, on Xarelto prior to admit 09/30/2012  . Diabetes mellitus without complication ([place])   . DM (diabetes mellitus) ([place]) 09/30/2012  . Dysrhythmia    a fib  . Heart murmur   . Hepatitis 2003  . Hypertension   . Myocardial infarction ([place])   . Permanent atrial fibrillation, since 1994 09/30/2012   stress test 02/08/12- normal study, no significant ischemia  . S/P AVR (aortic valve replacement), 09/30/2012   a. s/p mechcanical AVR in 09/2012 (on Coumadin)  . Shortness of breath   .  Sleep apnea    uses CPAP    Past Surgical History:  Procedure Laterality Date  . CARDIAC CATHETERIZATION  09/18/12   severe calcific aortic stenosis, peak gradient 84m, mean gradient 549m EF 45%  . CARDIAC VALVE REPLACEMENT     AVR 09-29-12  . CHOLECYSTECTOMY    . FRACTURE SURGERY      Allergies  Allergen Reactions  . Iohexol Anaphylaxis  . Niacin And Related     Flushing with immediate realese  . Penicillins Other (See Comments)    Unknown..aMarland Kitchenrtic stenosis a child  . Lasix [Furosemide] Rash    Current  Outpatient Medications  Medication Sig Dispense Refill  . acetaminophen (TYLENOL) 325 MG tablet Take 2 tablets (650 mg total) by mouth every 4 (four) hours as needed for pain or fever.    . Marland Kitchenlbuterol (PROVENTIL HFA;VENTOLIN HFA) 108 (90 BASE) MCG/ACT inhaler Inhale 2 puffs into the lungs every 6 (six) hours as needed. For shortness of breath    . Ascorbic Acid (VITAMIN C) 1000 MG tablet Take 1,000 mg by mouth daily.    . B-D ULTRAFINE III SHORT PEN 31G X 8 MM MISC     . betamethasone valerate (VALISONE) 0.1 % cream Apply 1 application topically daily.    . Blood Glucose Monitoring Suppl (BAYER CONTOUR MONITOR) W/DEVICE KIT     . cholecalciferol (VITAMIN D) 1000 UNITS tablet Take 1,000 Units by mouth daily.    . Chromium 200 MCG CAPS Take 200 mcg by mouth daily.    . clotrimazole-betamethasone (LOTRISONE) cream Apply 1 application topically as needed.    . digoxin (LANOXIN) 0.125 MG tablet TAKE 1 TABLET DAILY 90 tablet 1  . EASY TOUCH LANCETS 30G/TWIST MISC     . empagliflozin (JARDIANCE) 10 MG TABS tablet Take 10 mg by mouth daily. 90 tablet 3  . escitalopram (LEXAPRO) 10 MG tablet Take 10 mg by mouth every evening.     . fish oil-omega-3 fatty acids 1000 MG capsule Take 2 g by mouth daily.    . Fluticasone-Salmeterol (ADVAIR) 250-50 MCG/DOSE AEPB Inhale 1 puff into the lungs daily. For asthma related symptoms    . furosemide (LASIX) 40 MG tablet Take 1 tablet (40 mg total) by mouth daily. 90 tablet 3  . glimepiride (AMARYL) 2 MG tablet     . Glucosamine-Chondroit-Vit C-Mn (GLUCOSAMINE 1500 COMPLEX PO) Take 1,500 mg by mouth daily.    . insulin glargine (LANTUS) 100 UNIT/ML injection Inject 5-20 Units into the skin 2 (two) times daily. 5-20 units at breakfast and dinner depending on blood sugar levels    . irbesartan (AVAPRO) 300 MG tablet Take 1 tablet (300 mg total) by mouth daily. 90 tablet 3  . Liraglutide (VICTOZA [place]) Inject 1.8 mg into the skin 1 day or 1 dose.    . metFORMIN  (GLUCOPHAGE-XR) 500 MG 24 hr tablet Take 2,000 mg by mouth at bedtime.    . metoprolol succinate (TOPROL XL) 25 MG 24 hr tablet Take 0.5 tablets (12.5 mg total) by mouth daily. 45 tablet 3  . Multiple Vitamins-Minerals (CENTRUM SPECIALIST HEART PO) Take 1 tablet by mouth 2 (two) times daily.    . Potassium Gluconate 550 MG TABS Take 1 tablet by mouth daily.    . Marland Kitchenarfarin (COUMADIN) 10 MG tablet Take 10-15 mg by mouth every evening. 10 mg on Tues, Thurs, Sat and Sun and 12.5 mg on Mon, Wed, Frid     No current facility-administered medications for this visit.  Social History   Socioeconomic History  . Marital status: Married    Spouse name: [name]  . Number of children: 3  . Years of education: 15+  . Highest education level: Not on file  Social Needs  . Financial resource strain: Not on file  . Food insecurity - worry: Not on file  . Food insecurity - inability: Not on file  . Transportation needs - medical: Not on file  . Transportation needs - non-medical: Not on file  Occupational History    Employer: [employer]  Tobacco Use  . Smoking status: Former Smoker    Packs/day: 0.25    Years: 10.00    Pack years: 2.50    Types: Cigarettes    Last attempt to quit: 09/25/1982    Years since quitting: 35.0  . Smokeless tobacco: Former Network engineer and Sexual Activity  . Alcohol use: Yes    Comment: occ. beer/wine  . Drug use: No  . Sexual activity: Yes    Birth control/protection: None  Other Topics Concern  . Not on file  Social History Narrative   Patient is married [name]) and lives with his wife and son.   Patient has three children.   Patient is working full-time.   Patient has a college education.   Patient is right handed.   Patient drinks about 2-3 cups of coffee daily.    Family History  Problem Relation Age of Onset  . Hypertension Mother   . Diabetes Father   . Heart attack Brother 89  . Hyperlipidemia Brother 71       stents placed       ROS General: Negative; No fevers, chills, or night sweats; positive for obesity HEENT: Negative; No changes in vision or hearing, sinus congestion, difficulty swallowing Pulmonary: Negative; No cough, wheezing, shortness of breath, hemoptysis Cardiovascular: Positive for permanent AF, status post St. Jude aortic valve replacement Rare left ankle swelling GI: Negative; No nausea, vomiting, diarrhea, or abdominal pain GU: Negative; No dysuria, hematuria, or difficulty voiding Musculoskeletal: Negative; no myalgias, joint pain, or weakness Hematologic/Oncology: Negative; no easy bruising, bleeding Endocrine: Positive for diabetes Neuro: Negative; no changes in balance, headaches Skin: Negative; No rashes or skin lesions Psychiatric: Negative; No behavioral problems, depression Sleep: Negative; No snoring, daytime sleepiness, hypersomnolence, bruxism, restless legs, hypnogognic hallucinations, no cataplexy   PE BP 130/62   Pulse 67   Ht _0  (1.727 m)   Wt 247 lb (112 kg)   BMI 37.56 kg/m    Repeat blood pressure was 124/68  Wt Readings from Last 3 Encounters:  10/04/17 247 lb (112 kg)  08/21/17 245 lb (111.1 kg)  06/03/17 252 lb 12.8 oz (114.7 kg)   General: Alert, oriented, no distress.  Skin: normal turgor, no rashes, warm and dry HEENT: Normocephalic, atraumatic. Pupils equal round and reactive to light; sclera anicteric; extraocular muscles intact; Nose without nasal septal hypertrophy Mouth/Parynx benign; Mallinpatti scale 3 Neck: No JVD, no carotid bruits; normal carotid upstroke Lungs: clear to ausculatation and percussion; no wheezing or rales Chest wall: without tenderness to palpitation Heart: PMI not displaced, irregularly irregular with a controlled ventricular rate , now on the 60s;, s1 s2 normal, 1/6 systolic murmur, no diastolic murmur, no rubs, gallops, thrills, or heaves Abdomen: soft, nontender; no hepatosplenomehaly, BS+; abdominal aorta nontender  and not dilated by palpation. Back: no CVA tenderness Pulses 2+ Musculoskeletal: full range of motion, normal strength, no joint deformities Extremities: no clubbing cyanosis or edema, Homan's sign  negative  Neurologic: grossly nonfocal; Cranial nerves grossly wnl Psychologic: Normal mood and affect   ECG (independently read by me): Atrial fibrillation at 67 bpm, right bundle branch block  September 2018 ECG (independently read by me): atrial fibrillation with occasional PVCs.  Ventricular rate 92 bpm.  Right axis deviation.  September 2015 ECG (independently read by me): Atrial fibrillation with ventricular response of 57 beats per minute.  Nonspecific intraventricular block.  Right axis deviation.  Prior October 2014 ECG: Atrial fibrillation with ventricular rate in the 70s. Nonspecific interventricular conduction delay. Right superior axis deviation.  LABS: I reviewed the patient's recent [facility] hospitalization, 2-D echo Doppler study, laboratory  BMP Latest Ref Rng & Units 08/23/2014 08/24/2013 10/15/2012  Glucose 70 - 99 mg/dL 295(H) 146(H) 207(H)  BUN 6 - 23 mg/dL _0 Creatinine 0.50 - 1.35 mg/dL 1.06 1.13 0.86  Sodium 135 - 145 mEq/L 135 137 141  Potassium 3.5 - 5.3 mEq/L 5.3 4.8 4.2  Chloride 96 - 112 mEq/L 100 103 106  CO2 19 - 32 mEq/L _1 Calcium 8.4 - 10.5 mg/dL 9.3 9.5 9.2   Hepatic Function Latest Ref Rng & Units 08/23/2014 08/24/2013 09/25/2012  Total Protein 6.0 - 8.3 g/dL 6.3 6.6 7.3  Albumin 3.5 - 5.2 g/dL 4.2 4.2 4.1  AST 0 - 37 U/L _2 ALT 0 - 53 U/L _3 Alk Phosphatase 39 - 117 U/L 48 49 48  Total Bilirubin 0.2 - 1.2 mg/dL 0.5 0.6 0.4   CBC Latest Ref Rng & Units 08/24/2013 10/12/2012 10/02/2012  WBC 4.0 - 10.5 K/uL 9.5 10.5 11.9(H)  Hemoglobin 13.0 - 17.0 g/dL 13.2 10.5(L) 10.2(L)  Hematocrit 39.0 - 52.0 % 38.1(L) 31.8(L) 30.6(L)  Platelets 150 - 400 K/uL 189 280 123(L)   Lab Results  Component Value Date   MCV 80.9 08/24/2013    MCV 84.8 10/12/2012   MCV 85.7 10/02/2012   Lab Results  Component Value Date   TSH 1.290 11/06/2013   Lipid Panel     Component Value Date/Time   CHOL 166 08/23/2014 0853   TRIG 274 (H) 08/23/2014 0853   HDL 26 (L) 08/23/2014 0853   CHOLHDL 4.6 08/24/2013 1023   VLDL 38 08/24/2013 1023   LDLCALC 85 08/23/2014 0853     RADIOLOGY: No results found.  IMPRESSION:  1. History of aortic valve replacement   2. Severe aortic stenosis   3. Permanent atrial fibrillation, since 1994   4. Essential hypertension   5. Hyperlipidemia LDL goal <100   6. Current use of long term anticoagulation   7. Type 2 diabetes mellitus without complication, with long-term current use of insulin ([place])   8. Medication management   9. Acute on chronic congestive heart failure, unspecified heart failure type [facility])     ASSESSMENT AND PLAN: JosephDioguardi is a 66 year old male who is status post aortic valve replacement in October 2013 with a St. Jude aortic valve by Joseph Hoffman.  He has a long-standing history of permanent atrial fibrillation dating back to 1994 and also has a history of hypertension, obstructive sleep apnea on CPAP therapy, and diabetes mellitus with suboptimal control.  I had not seen him in over 3 years until his last office visit 2 months ago after he presented following an episode of CHF.  At that time, I initiated low-dose metoprolol succinate for improved rate control and blood pressure control.  His heart rate today is stable in the  60s with the addition of this therapy.  He continues to take furosemide 40 mg, digoxin 0.1125 mg, in addition to low-dose metoprolol, both for blood pressure and rate control.  With his not well-controlled diabetes i controlled and his recent CHF.  I initiated therapy with Jardiance.  He has noticed dramatic benefit since initiating therapy.  He denies any shortness of breath.  He has lost some weight.  He has not had recent laboratory.  Repeat blood work will be  obtained to reassess chemistry profile, BNP, CBC, lipid studies, hemoglobin A1c and TSH level.  If renal function remained stable and is greater than 45.  It may be possible to further target titrate Jardiance.  He continues to be on warfarin for anticoagulation and denies bleeding.  INR goal is in the range of 2.5-3.  6 months, he will undergo a follow-up echo Doppler study to reassess his mechanical aortic valve, and I will see him in the office for follow-up evaluation.   Time spent: 25 minutes   Troy Sine, MD, [facility]  10/06/2017 9:23 PM

## 2017-10-04 NOTE — Patient Instructions (Signed)
Medication Instructions:  Your physician recommends that you continue on your current medications as directed. Please refer to the Current Medication list given to you today.  Labwork: Please return for FASTING labs (CMET, CBC, Lipid, TSH, BNP, HmgA1c)  Our in office lab hours are Monday-Friday 8:00-4:30, closed for lunch 1-2 pm.  No appointment needed.  Testing/Procedures: Your physician has requested that you have an echocardiogram in 6 MONTHS. Echocardiography is a painless test that uses sound waves to create images of your heart. It provides your doctor with information about the size and shape of your heart and how well your heart's chambers and valves are working. This procedure takes approximately one hour. There are no restrictions for this procedure.  This will be done at our [facility] location:  [place]: Your physician wants you to follow-up in: 6 months with Dr. Claiborne Billings (after echo). You will receive a reminder letter in the mail two months in advance. If you don't receive a letter, please call our office to schedule the follow-up appointment.   Any Other Special Instructions Will Be Listed Below (If Applicable).     If you need a refill on your cardiac medications before your next appointment, please call your pharmacy.

## 2017-10-06 ENCOUNTER — Encounter: Payer: Self-pay | Admitting: Cardiovascular Disease

## 2017-10-07 ENCOUNTER — Telehealth: Payer: Self-pay | Admitting: Cardiovascular Disease

## 2017-10-07 ENCOUNTER — Other Ambulatory Visit: Payer: Self-pay | Admitting: *Deleted

## 2017-10-07 MED ORDER — METOPROLOL SUCCINATE ER 25 MG PO TB24: 12.5000 mg | ORAL_TABLET | Freq: Every day | ORAL | 2 refills | Status: DC

## 2017-10-07 NOTE — Telephone Encounter (Signed)
°*  STAT* If patient is at the pharmacy, call can be transferred to refill team.   1. Which medications need to be refilled? (please list name of each medication and dose if known) Jardiance 10 mg , Metoprolol XL 25 mg   2. Which pharmacy/location (including street and city if local pharmacy) is medication to be sent to?[facility]  3. Do they need a 30 day or 90 day supply?92  Refills was sent to [facility] . Per Mrs. Wegman  Patient has been with [facility] for a Couple of years now . Does not use [facility]

## 2017-10-16 MED ORDER — EMPAGLIFLOZIN 10 MG PO TABS: 10.0000 mg | ORAL_TABLET | Freq: Every day | ORAL | 3 refills | Status: DC

## 2017-10-16 NOTE — Telephone Encounter (Signed)
Rx sent to correct pharmacy-patient aware.

## 2017-10-16 NOTE — Telephone Encounter (Signed)
F/U call: Patient calling to say that Jardiance 10 mg should be sent [place].

## 2017-10-18 DIAGNOSIS — Z7901 Long term (current) use of anticoagulants: Secondary | ICD-10-CM | POA: Diagnosis not present

## 2017-10-18 DIAGNOSIS — E118 Type 2 diabetes mellitus with unspecified complications: Secondary | ICD-10-CM | POA: Diagnosis not present

## 2017-10-18 DIAGNOSIS — I482 Chronic atrial fibrillation: Secondary | ICD-10-CM | POA: Diagnosis not present

## 2017-10-22 ENCOUNTER — Other Ambulatory Visit: Payer: Self-pay | Admitting: *Deleted

## 2017-10-22 MED ORDER — EMPAGLIFLOZIN 10 MG PO TABS: 10.0000 mg | ORAL_TABLET | Freq: Every day | ORAL | 3 refills | Status: DC

## 2017-11-15 DIAGNOSIS — E118 Type 2 diabetes mellitus with unspecified complications: Secondary | ICD-10-CM | POA: Diagnosis not present

## 2017-11-15 DIAGNOSIS — Z7901 Long term (current) use of anticoagulants: Secondary | ICD-10-CM | POA: Diagnosis not present

## 2017-11-15 DIAGNOSIS — I482 Chronic atrial fibrillation: Secondary | ICD-10-CM | POA: Diagnosis not present

## 2017-11-29 DIAGNOSIS — E118 Type 2 diabetes mellitus with unspecified complications: Secondary | ICD-10-CM | POA: Diagnosis not present

## 2017-11-29 DIAGNOSIS — I482 Chronic atrial fibrillation: Secondary | ICD-10-CM | POA: Diagnosis not present

## 2017-11-29 DIAGNOSIS — Z7901 Long term (current) use of anticoagulants: Secondary | ICD-10-CM | POA: Diagnosis not present

## 2018-03-31 DIAGNOSIS — E118 Type 2 diabetes mellitus with unspecified complications: Secondary | ICD-10-CM | POA: Diagnosis not present

## 2018-03-31 DIAGNOSIS — Z7901 Long term (current) use of anticoagulants: Secondary | ICD-10-CM | POA: Diagnosis not present

## 2018-03-31 DIAGNOSIS — I482 Chronic atrial fibrillation: Secondary | ICD-10-CM | POA: Diagnosis not present

## 2018-04-03 ENCOUNTER — Other Ambulatory Visit: Payer: Self-pay

## 2018-04-03 ENCOUNTER — Ambulatory Visit (HOSPITAL_COMMUNITY): Payer: Medicare Other | Attending: Cardiovascular Disease

## 2018-04-03 DIAGNOSIS — Z952 Presence of prosthetic heart valve: Secondary | ICD-10-CM | POA: Insufficient documentation

## 2018-04-03 DIAGNOSIS — J45909 Unspecified asthma, uncomplicated: Secondary | ICD-10-CM | POA: Diagnosis not present

## 2018-04-03 DIAGNOSIS — R011 Cardiac murmur, unspecified: Secondary | ICD-10-CM | POA: Diagnosis not present

## 2018-04-03 DIAGNOSIS — I110 Hypertensive heart disease with heart failure: Secondary | ICD-10-CM | POA: Diagnosis not present

## 2018-04-03 DIAGNOSIS — G4730 Sleep apnea, unspecified: Secondary | ICD-10-CM | POA: Diagnosis not present

## 2018-04-03 DIAGNOSIS — I350 Nonrheumatic aortic (valve) stenosis: Secondary | ICD-10-CM | POA: Diagnosis not present

## 2018-04-03 DIAGNOSIS — F419 Anxiety disorder, unspecified: Secondary | ICD-10-CM | POA: Insufficient documentation

## 2018-04-03 DIAGNOSIS — Z794 Long term (current) use of insulin: Secondary | ICD-10-CM | POA: Diagnosis not present

## 2018-04-03 DIAGNOSIS — E785 Hyperlipidemia, unspecified: Secondary | ICD-10-CM | POA: Diagnosis not present

## 2018-04-03 DIAGNOSIS — I509 Heart failure, unspecified: Secondary | ICD-10-CM | POA: Insufficient documentation

## 2018-04-03 DIAGNOSIS — I252 Old myocardial infarction: Secondary | ICD-10-CM | POA: Insufficient documentation

## 2018-04-03 DIAGNOSIS — I10 Essential (primary) hypertension: Secondary | ICD-10-CM | POA: Diagnosis not present

## 2018-04-03 DIAGNOSIS — I482 Chronic atrial fibrillation: Secondary | ICD-10-CM | POA: Diagnosis not present

## 2018-04-03 DIAGNOSIS — Z7901 Long term (current) use of anticoagulants: Secondary | ICD-10-CM | POA: Diagnosis not present

## 2018-04-03 DIAGNOSIS — E119 Type 2 diabetes mellitus without complications: Secondary | ICD-10-CM | POA: Insufficient documentation

## 2018-04-03 DIAGNOSIS — I4891 Unspecified atrial fibrillation: Secondary | ICD-10-CM | POA: Insufficient documentation

## 2018-04-03 DIAGNOSIS — Z79899 Other long term (current) drug therapy: Secondary | ICD-10-CM | POA: Diagnosis not present

## 2018-04-03 LAB — LIPID PANEL
CHOL/HDL RATIO: 7.1 ratio — AB (ref 0.0–5.0)
Cholesterol, Total: 184 mg/dL (ref 100–199)
HDL: 26 mg/dL — ABNORMAL LOW (ref >39)
LDL Calculated: 90 mg/dL (ref 0–99)
Triglycerides: 339 mg/dL — ABNORMAL HIGH (ref 0–149)
VLDL Cholesterol Cal: 68 mg/dL — ABNORMAL HIGH (ref 5–40)

## 2018-04-03 LAB — TSH: TSH: 1.28 u[IU]/mL (ref 0.450–4.500)

## 2018-04-03 LAB — COMPREHENSIVE METABOLIC PANEL
ALBUMIN: 4.8 g/dL (ref 3.6–4.8)
ALT: 33 IU/L (ref 0–44)
AST: 24 IU/L (ref 0–40)
Albumin/Globulin Ratio: 2.2 (ref 1.2–2.2)
Alkaline Phosphatase: 51 IU/L (ref 39–117)
BUN / CREAT RATIO: 26 — AB (ref 10–24)
BUN: 30 mg/dL — ABNORMAL HIGH (ref 8–27)
Bilirubin Total: 0.4 mg/dL (ref 0.0–1.2)
CALCIUM: 9.9 mg/dL (ref 8.6–10.2)
CO2: 23 mmol/L (ref 20–29)
CREATININE: 1.15 mg/dL (ref 0.76–1.27)
Chloride: 98 mmol/L (ref 96–106)
GFR calc Af Amer: 75 mL/min/{1.73_m2} (ref >59)
GFR, EST NON AFRICAN AMERICAN: 65 mL/min/{1.73_m2} (ref >59)
GLOBULIN, TOTAL: 2.2 g/dL (ref 1.5–4.5)
Glucose: 257 mg/dL — ABNORMAL HIGH (ref 65–99)
Potassium: 5.3 mmol/L — ABNORMAL HIGH (ref 3.5–5.2)
SODIUM: 136 mmol/L (ref 134–144)
Total Protein: 7 g/dL (ref 6.0–8.5)

## 2018-04-03 LAB — CBC
HEMATOCRIT: 42.4 % (ref 37.5–51.0)
Hemoglobin: 13.8 g/dL (ref 13.0–17.7)
MCH: 27.4 pg (ref 26.6–33.0)
MCHC: 32.5 g/dL (ref 31.5–35.7)
MCV: 84 fL (ref 79–97)
PLATELETS: 168 10*3/uL (ref 150–379)
RBC: 5.03 x10E6/uL (ref 4.14–5.80)
RDW: 15.5 % — AB (ref 12.3–15.4)
WBC: 7.2 10*3/uL (ref 3.4–10.8)

## 2018-04-03 LAB — PRO B NATRIURETIC PEPTIDE: NT-PRO BNP: 503 pg/mL — AB (ref 0–376)

## 2018-04-16 ENCOUNTER — Telehealth: Payer: Self-pay | Admitting: Cardiovascular Disease

## 2018-04-16 NOTE — Telephone Encounter (Signed)
Called patient and LVM to call back to schedule followup with Dr. Claiborne Billings.

## 2018-04-16 NOTE — Telephone Encounter (Signed)
Follow Up:; ° ° °Returning your call. °

## 2018-04-16 NOTE — Telephone Encounter (Signed)
Attempt to return call-lmtcb 

## 2018-04-16 NOTE — Telephone Encounter (Signed)
Spoke to wife . Result given . Verbalized understanding. Aware prescription will be sent to [facility]

## 2018-04-18 MED ORDER — ICOSAPENT ETHYL 1 G PO CAPS: 2.0000 g | ORAL_CAPSULE | Freq: Two times a day (BID) | ORAL | 3 refills | Status: DC

## 2018-04-21 ENCOUNTER — Telehealth: Payer: Self-pay

## 2018-04-21 MED ORDER — ICOSAPENT ETHYL 1 G PO CAPS: 2.0000 g | ORAL_CAPSULE | Freq: Two times a day (BID) | ORAL | 3 refills | Status: DC

## 2018-04-21 NOTE — Telephone Encounter (Signed)
Received fax from [facility] stating they are no longer refilling meds for patient. Called patient to verify mail order pharmacy. Left message for patient to contact office.

## 2018-04-21 NOTE — Telephone Encounter (Signed)
Spoke with patients spouse and she stated patient is no longer using [facility]; patient has switched to [facility]. Chart updated. [place] sent to [facility].

## 2018-04-22 DIAGNOSIS — E118 Type 2 diabetes mellitus with unspecified complications: Secondary | ICD-10-CM | POA: Diagnosis not present

## 2018-04-22 DIAGNOSIS — Z7901 Long term (current) use of anticoagulants: Secondary | ICD-10-CM | POA: Diagnosis not present

## 2018-04-22 DIAGNOSIS — I482 Chronic atrial fibrillation: Secondary | ICD-10-CM | POA: Diagnosis not present

## 2018-05-09 ENCOUNTER — Telehealth: Payer: Self-pay | Admitting: *Deleted

## 2018-05-09 DIAGNOSIS — H1033 Unspecified acute conjunctivitis, bilateral: Secondary | ICD-10-CM | POA: Diagnosis not present

## 2018-05-09 NOTE — Telephone Encounter (Signed)
Left message to call back to schedule appt with Dr. Claiborne Billings 6/20

## 2018-05-22 ENCOUNTER — Encounter: Payer: Self-pay | Admitting: Cardiovascular Disease

## 2018-05-22 ENCOUNTER — Ambulatory Visit (INDEPENDENT_AMBULATORY_CARE_PROVIDER_SITE_OTHER): Payer: Medicare Other | Admitting: Cardiovascular Disease

## 2018-05-22 VITALS — BP 130/60 | HR 73 | Ht 71.0 in | Wt 263.4 lb

## 2018-05-22 DIAGNOSIS — I10 Essential (primary) hypertension: Secondary | ICD-10-CM

## 2018-05-22 DIAGNOSIS — I4821 Permanent atrial fibrillation: Secondary | ICD-10-CM

## 2018-05-22 DIAGNOSIS — Z794 Long term (current) use of insulin: Secondary | ICD-10-CM

## 2018-05-22 DIAGNOSIS — E1169 Type 2 diabetes mellitus with other specified complication: Secondary | ICD-10-CM

## 2018-05-22 DIAGNOSIS — Z7901 Long term (current) use of anticoagulants: Secondary | ICD-10-CM

## 2018-05-22 DIAGNOSIS — I350 Nonrheumatic aortic (valve) stenosis: Secondary | ICD-10-CM | POA: Diagnosis not present

## 2018-05-22 DIAGNOSIS — I482 Chronic atrial fibrillation: Secondary | ICD-10-CM | POA: Diagnosis not present

## 2018-05-22 DIAGNOSIS — E119 Type 2 diabetes mellitus without complications: Secondary | ICD-10-CM

## 2018-05-22 DIAGNOSIS — E782 Mixed hyperlipidemia: Secondary | ICD-10-CM | POA: Diagnosis not present

## 2018-05-22 MED ORDER — ROSUVASTATIN CALCIUM 10 MG PO TABS: ORAL_TABLET | ORAL | 1 refills | Status: DC

## 2018-05-22 MED ORDER — EMPAGLIFLOZIN 25 MG PO TABS: 25.0000 mg | ORAL_TABLET | Freq: Every day | ORAL | 1 refills | Status: DC

## 2018-05-22 MED ORDER — METOPROLOL SUCCINATE ER 25 MG PO TB24: 12.5000 mg | ORAL_TABLET | Freq: Every day | ORAL | 3 refills | Status: DC

## 2018-05-22 NOTE — Progress Notes (Signed)
Patient ID: Joseph Hoffman, male   DOB: 1951-05-26, 67 y.o.   MRN: [id number]        HPI: Joseph Hoffman, is a 67 y.o. male  who presents for a 7 month followup cardiology evaluation.  Joseph Hoffman has a history of permanent atrial fibrillation dating back to 41. He has a history of type 2 diabetes mellitus. He developed progressive aortic valve stenosis and 09/29/2012 underwent St. Jude aortic valve replacement by [name]. Additional problems also include hypertension, obstructive sleep apnea on CPAP and last year he obtained a new CPAP machine.  He had some transient CHF symptoms after his Lasix had been held following his surgery. He sees Dr. Wilson Singer for his diabetes.   An echo Doppler study in October 2013.  Following his surgery showed an EF of 55-60% and a St. Jude aortic valve was well seated with peak and mean gradients of 30 and 22 mm.  There was no mention of aortic insufficiency.  When I last saw him, his weight had been fairly stable  but he's been unable to lose additional weight.  AF, rate was controlled.  He had noted some intermittent ankle edema.  Since I last saw 3 years ago he retired in December 2016.  He was seen in July 2018 by Brittney straighter for preoperative clearance prior to undergoing colonoscopy.  He has been on Coumadin both for atrial fibrillation as well as his mechanical valve.  He was recently hospitalized overnight on September 11 and discharged on 08/14/2017 at [facility] in [place]. I reviewed these records.  He presented with acute on chronic CHF exacerbation and received IV Lasix.  His troponin was mildly increased secondary to acute onset CHF exacerbation.  His blood sugar was elevated.  He was discharged following day.  NTproBNP was markedly elevated at 1574. He underwent an echo Doppler study which showed an EF of 50-55%.  The mitral valve leaflets were calcified and there was 2+ MR.  His prosthetic aortic valve was not well visualized and there  was 2+ moderate aortic regurgitation and moderate tricuspid regurgitation.  He was in atrial fibrillation.    After not seeing him in over 3 years, I saw him for evaluation in 2018 following his hospitalization.  He has permanent atrial fibrillation and I recommended initiation of Toprol-XL will 0.5 mg daily for improved rate control and blood pressure control.  With his stable renal function.  I felt he was a good candidate for Jardiance with reference to his diabetes mellitus and particularly with his recent CHF episode and initiated therapy with 10 mg.   Since I last saw him in November 2018, he underwent a follow-up echo Doppler study on Apr 03, 2018.  This showed mild LVH with normal systolic function with an EF of 50 to 55% without regional wall motion abnormalities.  His mechanical prosthesis in the aortic valve was well-seated.  Mean aortic gradient was 26 mm with a peak gradient of 36 mm.  There is mild AR.  There was mitral annular calcification with mild MR.  He had moderate to severe biatrial enlargement.  RV size is mildly dilated.  He had seen Dr. Maudie Mercury to reestablish primary care since Dr. Wilson Singer had retired.  No adjustments were made to his regimen.  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  He denies any chest pain or CHF symptoms.  He has noticed significant improvement since I initiated Jardiance.  He denies any shortness  of breath , palpitations, PND orthopnea.  He had undergone laboratory on Apr 03, 2018 which showed a cholesterol of 184, HDL 26, LDL 90, but triglycerides were elevated at 339.  I initiated Vascepa 2 capsules twice a day.  He presents for cardiology evaluation.  Past Medical History:  Diagnosis Date  . Anxiety   . Aortic stenosis   . Arthritis   . Asthma   . Asymptomatic LV dysfunction, EF 45%, normal coronary arteries on cardiac cath 09/18/12 09/30/2012  . CHF (congestive heart failure) ([place])   . Chronic anticoagulation, on Xarelto prior to  admit 09/30/2012  . Diabetes mellitus without complication ([place])   . DM (diabetes mellitus) ([place]) 09/30/2012  . Dysrhythmia    a fib  . Heart murmur   . Hepatitis 2003  . Hypertension   . Myocardial infarction ([place])   . Permanent atrial fibrillation, since 1994 09/30/2012   stress test 02/08/12- normal study, no significant ischemia  . S/P AVR (aortic valve replacement), 09/30/2012   a. s/p mechcanical AVR in 09/2012 (on Coumadin)  . Shortness of breath   . Sleep apnea    uses CPAP    Past Surgical History:  Procedure Laterality Date  . ABDOMINAL ANGIOGRAM  09/18/2012   Procedure: ABDOMINAL ANGIOGRAM;  Surgeon: Troy Sine, MD;  Location: [facility] CATH LAB;  Service: Cardiovascular;;  . AORTIC VALVE REPLACEMENT  09/29/2012   Procedure: AORTIC VALVE REPLACEMENT (AVR);  Surgeon: Gaye Pollack, MD;  Location: [place];  Service: Open Heart Surgery;  Laterality: N/A;  . ARCH AORTOGRAM  09/18/2012   Procedure: ARCH AORTOGRAM;  Surgeon: Troy Sine, MD;  Location: [facility] CATH LAB;  Service: Cardiovascular;;  . CARDIAC CATHETERIZATION  09/18/12   severe calcific aortic stenosis, peak gradient 51m, mean gradient 571m EF 45%  . CARDIAC VALVE REPLACEMENT     AVR 09-29-12  . CHOLECYSTECTOMY    . FRACTURE SURGERY    . LEFT AND RIGHT HEART CATHETERIZATION WITH CORONARY ANGIOGRAM N/A 09/18/2012   Procedure: LEFT AND RIGHT HEART CATHETERIZATION WITH CORONARY ANGIOGRAM;  Surgeon: ThTroy SineMD;  [facility];  Service: Cardiovascular;  Laterality: N/A;    Allergies  Allergen Reactions  . Iohexol Anaphylaxis  . Niacin And Related     Flushing with immediate realese  . Penicillins Other (See Comments)    Unknown..aMarland Kitchenrtic stenosis a child  . Lasix [Furosemide] Rash    Current Outpatient Medications  Medication Sig Dispense Refill  . acetaminophen (TYLENOL) 325 MG tablet Take 2 tablets (650 mg total) by mouth every 4 (four) hours as needed for pain or fever.    . Marland Kitchenlbuterol  (PROVENTIL HFA;VENTOLIN HFA) 108 (90 BASE) MCG/ACT inhaler Inhale 2 puffs into the lungs every 6 (six) hours as needed. For shortness of breath    . Ascorbic Acid (VITAMIN C) 1000 MG tablet Take 1,000 mg by mouth daily.    . B-D ULTRAFINE III SHORT PEN 31G X 8 MM MISC     . betamethasone valerate (VALISONE) 0.1 % cream Apply 1 application topically daily as needed.     . cholecalciferol (VITAMIN D) 1000 UNITS tablet Take 1,000 Units by mouth daily.    . Chromium 200 MCG CAPS Take 200 mcg by mouth daily.    . clotrimazole-betamethasone (LOTRISONE) cream Apply 1 application topically as needed.    . digoxin (LANOXIN) 0.125 MG tablet TAKE 1 TABLET DAILY 90 tablet 1  . EASY TOUCH LANCETS 30G/TWIST MISC     .  escitalopram (LEXAPRO) 10 MG tablet Take 10 mg by mouth every evening.     . Fluticasone-Salmeterol (ADVAIR) 250-50 MCG/DOSE AEPB Inhale 1 puff into the lungs daily. For asthma related symptoms    . furosemide (LASIX) 40 MG tablet Take 1 tablet (40 mg total) by mouth daily. 90 tablet 3  . glimepiride (AMARYL) 2 MG tablet     . Glucosamine-Chondroit-Vit C-Mn (GLUCOSAMINE 1500 COMPLEX PO) Take 1,500 mg by mouth daily.    Vanessa Kick Ethyl (VASCEPA) 1 g CAPS Take 2 capsules (2 g total) by mouth 2 (two) times daily. 360 capsule 3  . insulin glargine (LANTUS) 100 UNIT/ML injection Inject 5-20 Units into the skin 2 (two) times daily. 5-20 units at breakfast and dinner depending on blood sugar levels    . irbesartan (AVAPRO) 300 MG tablet Take 1 tablet (300 mg total) by mouth daily. 90 tablet 3  . Liraglutide (VICTOZA [place]) Inject 1.8 mg into the skin 1 day or 1 dose.    . metFORMIN (GLUCOPHAGE-XR) 500 MG 24 hr tablet Take 2,000 mg by mouth at bedtime.    . metoprolol succinate (TOPROL XL) 25 MG 24 hr tablet Take 0.5 tablets (12.5 mg total) by mouth daily. 45 tablet 3  . Multiple Vitamins-Minerals (CENTRUM SPECIALIST HEART PO) Take 1 tablet by mouth 2 (two) times daily.    . Potassium Gluconate 550 MG  TABS Take 1 tablet by mouth daily.    [name] warfarin (COUMADIN) 10 MG tablet Take 10-15 mg by mouth every evening. 10 mg on Tues, Thurs, Sat and Sun and 12.5 mg on Mon, Wed, Frid    . empagliflozin (JARDIANCE) 25 MG TABS tablet Take 25 mg by mouth daily. 90 tablet 1  . rosuvastatin (CRESTOR) 10 MG tablet Take one tablet twice a week 40 tablet 1   No current facility-administered medications for this visit.     Social History   Socioeconomic History  . Marital status: Married    Spouse name: [name]  . Number of children: 3  . Years of education: 15+  . Highest education level: Not on file  Occupational History    Employer: [employer]  . Financial resource strain: Not on file  . Food insecurity:    Worry: Not on file    Inability: Not on file  . Transportation needs:    Medical: Not on file    Non-medical: Not on file  Tobacco Use  . Smoking status: Former Smoker    Packs/day: 0.25    Years: 10.00    Pack years: 2.50    Types: Cigarettes    Last attempt to quit: 09/25/1982    Years since quitting: 35.6  . Smokeless tobacco: Former Network engineer and Sexual Activity  . Alcohol use: Yes    Comment: occ. beer/wine  . Drug use: No  . Sexual activity: Yes    Birth control/protection: None  Lifestyle  . Physical activity:    Days per week: Not on file    Minutes per session: Not on file  . Stress: Not on file  Relationships  . Social connections:    Talks on phone: Not on file    Gets together: Not on file    Attends religious service: Not on file    Active member of club or organization: Not on file    Attends meetings of clubs or organizations: Not on file    Relationship status: Not on file  . Intimate partner violence:  Fear of current or ex partner: Not on file    Emotionally abused: Not on file    Physically abused: Not on file    Forced sexual activity: Not on file  Other Topics Concern  . Not on file  Social History Narrative   Patient is  married [name]) and lives with his wife and son.   Patient has three children.   Patient is working full-time.   Patient has a college education.   Patient is right handed.   Patient drinks about 2-3 cups of coffee daily.    Family History  Problem Relation Age of Onset  . Hypertension Mother   . Diabetes Father   . Heart attack Brother 23  . Hyperlipidemia Brother 61       stents placed      ROS General: Negative; No fevers, chills, or night sweats; positive for obesity HEENT: Negative; No changes in vision or hearing, sinus congestion, difficulty swallowing Pulmonary: Negative; No cough, wheezing, shortness of breath, hemoptysis Cardiovascular: Positive for permanent AF, status post St. Jude aortic valve replacement Rare left ankle swelling GI: Negative; No nausea, vomiting, diarrhea, or abdominal pain GU: Negative; No dysuria, hematuria, or difficulty voiding Musculoskeletal: Negative; no myalgias, joint pain, or weakness Hematologic/Oncology: Negative; no easy bruising, bleeding Endocrine: Positive for diabetes Neuro: Negative; no changes in balance, headaches Skin: Negative; No rashes or skin lesions Psychiatric: Negative; No behavioral problems, depression Sleep: Negative; No snoring, daytime sleepiness, hypersomnolence, bruxism, restless legs, hypnogognic hallucinations, no cataplexy   PE BP 130/60   Pulse 73   Ht 5' 11"  (1.803 m)   Wt 263 lb 6.4 oz (119.5 kg)   SpO2 95%   BMI 36.74 kg/m    Repeat blood pressure was 128/64  Wt Readings from Last 3 Encounters:  05/22/18 263 lb 6.4 oz (119.5 kg)  10/04/17 247 lb (112 kg)  08/21/17 245 lb (111.1 kg)   General: Alert, oriented, no distress.  Skin: normal turgor, no rashes, warm and dry HEENT: Normocephalic, atraumatic. Pupils equal round and reactive to light; sclera anicteric; extraocular muscles intact;  Nose without nasal septal hypertrophy Mouth/Parynx benign; Mallinpatti scale 3 Neck: No JVD, no  carotid bruits; normal carotid upstroke Lungs: clear to ausculatation and percussion; no wheezing or rales Chest wall: without tenderness to palpitation Heart: PMI not displaced, RRR, s1 s2 normal, 2/6 systolic murmur the aortic area crisp prosthetic valve sound, no diastolic murmur, no rubs, gallops, thrills, or heaves Abdomen: soft, nontender; no hepatosplenomehaly, BS+; abdominal aorta nontender and not dilated by palpation. Back: no CVA tenderness Pulses 2+ Musculoskeletal: full range of motion, normal strength, no joint deformities Extremities: no clubbing cyanosis or edema, Homan's sign negative  Neurologic: grossly nonfocal; Cranial nerves grossly wnl Psychologic: Normal mood and affect   ECG (independently read by me): 2 fibrillation at 73 bpm.  Right superior axis.  Nonspecific ventricular block.  November 2018 ECG (independently read by me): Atrial fibrillation at 67 bpm, right bundle branch block  September 2018 ECG (independently read by me): atrial fibrillation with occasional PVCs.  Ventricular rate 92 bpm.  Right axis deviation.  September 2015 ECG (independently read by me): Atrial fibrillation with ventricular response of 57 beats per minute.  Nonspecific intraventricular block.  Right axis deviation.   October 2014 ECG: Atrial fibrillation with ventricular rate in the 70s. Nonspecific interventricular conduction delay. Right superior axis deviation.  LABS: I reviewed the patient's recent [facility] hospitalization, 2-D echo Doppler study, laboratory  BMP Latest Ref Rng &  Units 04/03/2018 08/23/2014 08/24/2013  Glucose 65 - 99 mg/dL 257(H) 295(H) 146(H)  BUN 8 - 27 mg/dL 30(H) 23 21  Creatinine 0.76 - 1.27 mg/dL 1.15 1.06 1.13  BUN/Creat Ratio 10 - 24 26(H) - -  Sodium 134 - 144 mmol/L 136 135 137  Potassium 3.5 - 5.2 mmol/L 5.3(H) 5.3 4.8  Chloride 96 - 106 mmol/L 98 100 103  CO2 20 - 29 mmol/L 23 27 27   Calcium 8.6 - 10.2 mg/dL 9.9 9.3 9.5   Hepatic Function Latest Ref  Rng & Units 04/03/2018 08/23/2014 08/24/2013  Total Protein 6.0 - 8.5 g/dL 7.0 6.3 6.6  Albumin 3.6 - 4.8 g/dL 4.8 4.2 4.2  AST 0 - 40 IU/L 24 23 22   ALT 0 - 44 IU/L 33 29 27  Alk Phosphatase 39 - 117 IU/L 51 48 49  Total Bilirubin 0.0 - 1.2 mg/dL 0.4 0.5 0.6   CBC Latest Ref Rng & Units 04/03/2018 08/24/2013 10/12/2012  WBC 3.4 - 10.8 x10E3/uL 7.2 9.5 10.5  Hemoglobin 13.0 - 17.7 g/dL 13.8 13.2 10.5(L)  Hematocrit 37.5 - 51.0 % 42.4 38.1(L) 31.8(L)  Platelets 150 - 379 x10E3/uL 168 189 280   Lab Results  Component Value Date   MCV 84 04/03/2018   MCV 80.9 08/24/2013   MCV 84.8 10/12/2012   Lab Results  Component Value Date   TSH 1.280 04/03/2018   Lipid Panel     Component Value Date/Time   CHOL 184 04/03/2018 0809   CHOL 166 08/23/2014 0853   TRIG 339 (H) 04/03/2018 0809   TRIG 274 (H) 08/23/2014 0853   HDL 26 (L) 04/03/2018 0809   HDL 26 (L) 08/23/2014 0853   CHOLHDL 7.1 (H) 04/03/2018 0809   CHOLHDL 4.6 08/24/2013 1023   VLDL 38 08/24/2013 1023   LDLCALC 90 04/03/2018 0809   LDLCALC 85 08/23/2014 0853     RADIOLOGY: No results found.  IMPRESSION:  1. Severe aortic stenosis; S/P Saint Jude AVR by [name] October 2013   2. Permanent atrial fibrillation, since 1994   3. Essential hypertension   4. Mixed hyperlipidemia due to type 2 diabetes mellitus ([place])   5. Type 2 diabetes mellitus without complication, with long-term current use of insulin ([facility])   6. Warfarin anticoagulation   7. Morbid obesity ([place])     ASSESSMENT AND PLAN: JosephHout is a 67 year-old male who is status post aortic valve replacement in October 2013 with a St. Jude aortic valve by [name].  He has a long-standing history of permanent atrial fibrillation dating back to 1994 and also has a history of hypertension, obstructive sleep apnea on CPAP therapy, and diabetes mellitus with suboptimal control.  I had not seen him in over 3 years until September 2018 after he presented following an  episode of CHF.  At that time, I initiated low-dose metoprolol succinate for improved rate control and blood pressure control. When I saw him in follow-up in November 2018 his heart rate was stable in the 60s with the addition of this therapy. With his suboptimally controlled diabetes and his recent CHF, I initiated therapy with Jardiance.  He has noticed dramatic benefit since initiating therapy.  I reviewed his most recent echo Doppler study with him from 2019.  Systolic function is 50 to 55%.  His mechanical aortic valve prosthesis is well-seated with mild gradient consistent with his valve with mild but not  significant aortic insufficiency.  I reviewed his recent laboratory with him in detail from  May 2019.  He is now on the Vascepa 2 capsules twice a day with his significant triglyceride elevation.  His lipid profile was consistent with atherogenic profile with triglycerides elevated low HDL and increased VLDL.  I have now recommended a trial of low-dose Crestor 10 mg 2 times per week.  Remotely in the past he may have had some issues with daily dosing of statin therapy.  His most recent renal function is excellent with a GFR at 65.  For this reason I have recommended titration of Jardiance to 25 mg.  His proBNP from 1 month ago was 503.  He is not having any anginal symptoms.  His blood pressure today is controlled at 130/60.  He continues to be on digoxin 0.125 mg daily, Toprol-XL 12.5 mg, irbesartan 300 mg and furosemide 40 mg daily for blood pressure control and edema.  He continues to be on insulin, and metformin and I will now further titrate Jardiance.  In 2 to 3 months he will undergo a comprehensive metabolic panel, hemoglobin A1c, and fasting lipid panel.  He is anticoagulated on warfarin and denies bleeding.  I will see him in 3 months for reevaluation.  Time spent: 30 minutes Troy Sine, MD, [facility]  05/24/2018 8:50 AM

## 2018-05-22 NOTE — Patient Instructions (Signed)
Medication Instructions: INCREASE the Jardiance 25 mg daily START Rosuvastatin 10 mg twice a week  If you need a refill on your cardiac medications before your next appointment, please call your pharmacy.   Labwork: Your provider would like for you to return in 2 months to have the following labs drawn: FASTING Lipid, cmet and a A1C. You do not need an appointment for the lab. Once in our office lobby there is a podium where you can sign in and ring the doorbell to alert [place] that you are here. The lab is open from 8:00 am to 4:30 pm; closed for lunch from 12:45pm-1:45pm.  Follow-Up: Your physician wants you to follow-up in 3-4 months with Dr. Claiborne Billings.    Thank you for choosing Heartcare at [facility] [address]!!

## 2018-05-24 ENCOUNTER — Encounter: Payer: Self-pay | Admitting: Cardiovascular Disease

## 2018-07-10 ENCOUNTER — Other Ambulatory Visit: Payer: Self-pay | Admitting: *Deleted

## 2018-07-10 DIAGNOSIS — G4733 Obstructive sleep apnea (adult) (pediatric): Secondary | ICD-10-CM | POA: Diagnosis not present

## 2018-07-10 DIAGNOSIS — Z7901 Long term (current) use of anticoagulants: Secondary | ICD-10-CM | POA: Diagnosis not present

## 2018-07-10 DIAGNOSIS — Z23 Encounter for immunization: Secondary | ICD-10-CM | POA: Diagnosis not present

## 2018-07-10 DIAGNOSIS — I10 Essential (primary) hypertension: Secondary | ICD-10-CM | POA: Diagnosis not present

## 2018-07-10 DIAGNOSIS — E782 Mixed hyperlipidemia: Secondary | ICD-10-CM | POA: Diagnosis not present

## 2018-07-10 DIAGNOSIS — J45909 Unspecified asthma, uncomplicated: Secondary | ICD-10-CM | POA: Diagnosis not present

## 2018-07-10 DIAGNOSIS — I509 Heart failure, unspecified: Secondary | ICD-10-CM | POA: Diagnosis not present

## 2018-07-10 DIAGNOSIS — F322 Major depressive disorder, single episode, severe without psychotic features: Secondary | ICD-10-CM | POA: Diagnosis not present

## 2018-07-10 DIAGNOSIS — I4891 Unspecified atrial fibrillation: Secondary | ICD-10-CM | POA: Diagnosis not present

## 2018-07-10 DIAGNOSIS — E1142 Type 2 diabetes mellitus with diabetic polyneuropathy: Secondary | ICD-10-CM | POA: Diagnosis not present

## 2018-07-10 DIAGNOSIS — Z0000 Encounter for general adult medical examination without abnormal findings: Secondary | ICD-10-CM | POA: Diagnosis not present

## 2018-07-10 MED ORDER — DIGOXIN 125 MCG PO TABS: 125.0000 ug | ORAL_TABLET | Freq: Every day | ORAL | 1 refills | Status: DC

## 2018-07-18 DIAGNOSIS — Z7901 Long term (current) use of anticoagulants: Secondary | ICD-10-CM | POA: Diagnosis not present

## 2018-07-30 DIAGNOSIS — I509 Heart failure, unspecified: Secondary | ICD-10-CM | POA: Diagnosis not present

## 2018-07-30 DIAGNOSIS — E1142 Type 2 diabetes mellitus with diabetic polyneuropathy: Secondary | ICD-10-CM | POA: Diagnosis not present

## 2018-07-30 DIAGNOSIS — G4733 Obstructive sleep apnea (adult) (pediatric): Secondary | ICD-10-CM | POA: Diagnosis not present

## 2018-07-30 DIAGNOSIS — I4891 Unspecified atrial fibrillation: Secondary | ICD-10-CM | POA: Diagnosis not present

## 2018-08-22 DIAGNOSIS — I4891 Unspecified atrial fibrillation: Secondary | ICD-10-CM | POA: Diagnosis not present

## 2018-08-22 DIAGNOSIS — Z7901 Long term (current) use of anticoagulants: Secondary | ICD-10-CM | POA: Diagnosis not present

## 2018-08-22 DIAGNOSIS — Z23 Encounter for immunization: Secondary | ICD-10-CM | POA: Diagnosis not present

## 2018-09-17 DIAGNOSIS — E119 Type 2 diabetes mellitus without complications: Secondary | ICD-10-CM | POA: Diagnosis not present

## 2018-09-17 DIAGNOSIS — I10 Essential (primary) hypertension: Secondary | ICD-10-CM | POA: Diagnosis not present

## 2018-09-17 DIAGNOSIS — E785 Hyperlipidemia, unspecified: Secondary | ICD-10-CM | POA: Diagnosis not present

## 2018-09-17 DIAGNOSIS — Z794 Long term (current) use of insulin: Secondary | ICD-10-CM | POA: Diagnosis not present

## 2018-09-18 LAB — COMPREHENSIVE METABOLIC PANEL
A/G RATIO: 2 (ref 1.2–2.2)
ALT: 32 IU/L (ref 0–44)
AST: 27 IU/L (ref 0–40)
Albumin: 4.8 g/dL (ref 3.6–4.8)
Alkaline Phosphatase: 64 IU/L (ref 39–117)
BILIRUBIN TOTAL: 0.6 mg/dL (ref 0.0–1.2)
BUN/Creatinine Ratio: 40 — ABNORMAL HIGH (ref 10–24)
BUN: 48 mg/dL — ABNORMAL HIGH (ref 8–27)
CO2: 19 mmol/L — ABNORMAL LOW (ref 20–29)
Calcium: 9.8 mg/dL (ref 8.6–10.2)
Chloride: 97 mmol/L (ref 96–106)
Creatinine, Ser: 1.19 mg/dL (ref 0.76–1.27)
GFR calc non Af Amer: 62 mL/min/{1.73_m2} (ref >59)
GFR, EST AFRICAN AMERICAN: 72 mL/min/{1.73_m2} (ref >59)
Globulin, Total: 2.4 g/dL (ref 1.5–4.5)
Glucose: 315 mg/dL — ABNORMAL HIGH (ref 65–99)
Potassium: 5.5 mmol/L — ABNORMAL HIGH (ref 3.5–5.2)
Sodium: 136 mmol/L (ref 134–144)
TOTAL PROTEIN: 7.2 g/dL (ref 6.0–8.5)

## 2018-09-18 LAB — HEMOGLOBIN A1C
Est. average glucose Bld gHb Est-mCnc: 243 mg/dL
Hgb A1c MFr Bld: 10.1 % — ABNORMAL HIGH (ref 4.8–5.6)

## 2018-09-18 LAB — LIPID PANEL
CHOLESTEROL TOTAL: 213 mg/dL — AB (ref 100–199)
Chol/HDL Ratio: 10.7 ratio — ABNORMAL HIGH (ref 0.0–5.0)
HDL: 20 mg/dL — ABNORMAL LOW (ref >39)
Triglycerides: 672 mg/dL (ref 0–149)

## 2018-09-22 ENCOUNTER — Ambulatory Visit (INDEPENDENT_AMBULATORY_CARE_PROVIDER_SITE_OTHER): Payer: Medicare Other | Admitting: Cardiovascular Disease

## 2018-09-22 ENCOUNTER — Encounter: Payer: Self-pay | Admitting: Cardiovascular Disease

## 2018-09-22 VITALS — BP 136/67 | HR 74 | Ht 71.0 in | Wt 264.8 lb

## 2018-09-22 DIAGNOSIS — I350 Nonrheumatic aortic (valve) stenosis: Secondary | ICD-10-CM | POA: Diagnosis not present

## 2018-09-22 DIAGNOSIS — E782 Mixed hyperlipidemia: Secondary | ICD-10-CM

## 2018-09-22 DIAGNOSIS — E875 Hyperkalemia: Secondary | ICD-10-CM | POA: Diagnosis not present

## 2018-09-22 DIAGNOSIS — Z794 Long term (current) use of insulin: Secondary | ICD-10-CM | POA: Diagnosis not present

## 2018-09-22 DIAGNOSIS — I10 Essential (primary) hypertension: Secondary | ICD-10-CM | POA: Diagnosis not present

## 2018-09-22 DIAGNOSIS — Z79899 Other long term (current) drug therapy: Secondary | ICD-10-CM | POA: Diagnosis not present

## 2018-09-22 DIAGNOSIS — Z952 Presence of prosthetic heart valve: Secondary | ICD-10-CM | POA: Diagnosis not present

## 2018-09-22 DIAGNOSIS — E119 Type 2 diabetes mellitus without complications: Secondary | ICD-10-CM | POA: Diagnosis not present

## 2018-09-22 DIAGNOSIS — E1169 Type 2 diabetes mellitus with other specified complication: Secondary | ICD-10-CM | POA: Diagnosis not present

## 2018-09-22 DIAGNOSIS — I4821 Permanent atrial fibrillation: Secondary | ICD-10-CM | POA: Diagnosis not present

## 2018-09-22 MED ORDER — EZETIMIBE 10 MG PO TABS: 10.0000 mg | ORAL_TABLET | Freq: Every day | ORAL | 0 refills | Status: DC

## 2018-09-22 MED ORDER — FENOFIBRATE 145 MG PO TABS: 145.0000 mg | ORAL_TABLET | Freq: Every day | ORAL | 0 refills | Status: DC

## 2018-09-22 NOTE — Patient Instructions (Signed)
Medication Instructions:  START Zetia 10 mg daily START fenofibrate 145 mg daily Continue to hold irbesartan until lab results  If you need a refill on your cardiac medications before your next appointment, please call your pharmacy.   Lab work: Please return for FASTING labs in 4 weeks (CMET, Lipid)  Our in office lab hours are Monday-Friday 8:00-4:00, closed for lunch 12:45-1:45 pm.  No appointment needed.  If you have labs (blood work) drawn today and your tests are completely normal, you will receive your results only by: [name] MyChart Message (if you have MyChart) OR . A paper copy in the mail If you have any lab test that is abnormal or we need to change your treatment, we will call you to review the results.  Follow-Up: At [facility], you and your health needs are our priority.  As part of our continuing mission to provide you with exceptional heart care, we have created designated Provider Care Teams.  These Care Teams include your primary Cardiologist (physician) and Advanced Practice Providers (APPs -  Physician Assistants and Nurse Practitioners) who all work together to provide you with the care you need, when you need it. You will need a follow up appointment in 6 weeks.  Please call our office 2 months in advance to schedule this appointment.  You may see Dr. Claiborne Billings or one of the following Advanced Practice Providers on your designated Care Team: [place], [place] . Fabian Sharp, PA-C

## 2018-09-22 NOTE — Progress Notes (Signed)
Patient ID: Joseph Hoffman, male   DOB: 05/08/1951, 67 y.o.   MRN: [id number]        HPI: Joseph Hoffman, is a 67 y.o. male  who presents for a 6 month followup cardiology evaluation.  Mr. Schools has a history of permanent atrial fibrillation dating back to 20. He has a history of type 2 diabetes mellitus. He developed progressive aortic valve stenosis and 09/29/2012 underwent St. Jude aortic valve replacement by [name]. Additional problems also include hypertension, obstructive sleep apnea on CPAP and last year he obtained a new CPAP machine.  He had some transient CHF symptoms after his Lasix had been held following his surgery. He sees Dr. Wilson Singer for his diabetes.   An echo Doppler study in October 2013.  Following his surgery showed an EF of 55-60% and a St. Jude aortic valve was well seated with peak and mean gradients of 30 and 22 mm.  There was no mention of aortic insufficiency.  When I last saw him, his weight had been fairly stable  but he's been unable to lose additional weight.  AF, rate was controlled.  He had noted some intermittent ankle edema.  Since I last saw 3 years ago he retired in December 2016.  He was seen in July 2018 by Brittney straighter for preoperative clearance prior to undergoing colonoscopy.  He has been on Coumadin both for atrial fibrillation as well as his mechanical valve.  He was recently hospitalized overnight on September 11 and discharged on 08/14/2017 at [facility] in [place]. I reviewed these records.  He presented with acute on chronic CHF exacerbation and received IV Lasix.  His troponin was mildly increased secondary to acute onset CHF exacerbation.  His blood sugar was elevated.  He was discharged following day.  NTproBNP was markedly elevated at 1574. He underwent an echo Doppler study which showed an EF of 50-55%.  The mitral valve leaflets were calcified and there was 2+ MR.  His prosthetic aortic valve was not well visualized and there  was 2+ moderate aortic regurgitation and moderate tricuspid regurgitation.  He was in atrial fibrillation.    After not seeing him in over 3 years, I saw him for evaluation in 2018 following his hospitalization.  He has permanent atrial fibrillation and I recommended initiation of Toprol-XL will 0.5 mg daily for improved rate control and blood pressure control.  With his stable renal function.  I felt he was a good candidate for Jardiance with reference to his diabetes mellitus and particularly with his recent CHF episode and initiated therapy with 10 mg.   Since I last saw him in November 2018, he underwent a follow-up echo Doppler study on Apr 03, 2018.  This showed mild LVH with normal systolic function with an EF of 50 to 55% without regional wall motion abnormalities.  His mechanical prosthesis in the aortic valve was well-seated.  Mean aortic gradient was 26 mm with a peak gradient of 36 mm.  There is mild AR.  There was mitral annular calcification with mild MR.  He had moderate to severe biatrial enlargement.  RV size is mildly dilated.  He had seen Dr. Maudie Mercury to reestablish primary care since Dr. Wilson Singer had retired.  No adjustments were made to his regimen.  He is now employed as a Government social research officer for [employer].  He denies any chest pain or CHF symptoms.  He has noticed significant improvement since I initiated Jardiance.  He denies any shortness of  breath , palpitations, PND orthopnea.  He had undergone laboratory on Apr 03, 2018 which showed a cholesterol of 184, HDL 26, LDL 90, but triglycerides were elevated at 339.  I initiated Vascepa 2 capsules twice a day.   I last saw him in June 2019.  At that time I recommended a trial of low-dose Crestor 10 mg 2 times per week.  I also recommended further titration of Jardiance to 25 mg daily.  Over the past several months, he admits that he has not been doing well with reference to diet.  He stopped the Crestor.  He has been having a lot of tomatoes  and cheese.  He had seen his primary doctor and apparently there is been significant delay in him being seen by endocrinology in [place] with an initial appointment set for late March 2020.  He underwent a follow-up echo Doppler study on Apr 03, 2018 which showed normal LV function.  He had a well-seated mechanical aortic valve prosthesis with mild AR.  There was mitral annular calcification with mild MR and evidence for biatrial enlargement.  He recently had laboratory which confirmed he is not been doing well with his diet.  Total cholesterol was 213 but triglycerides had risen to 672, HDL was low at 20 and LDL could not be calculated.  His potassium was 5.5.  Glucose was 315.  Hemoglobin A1c was markedly elevated at 10.1.  He presents for evaluation.   Past Medical History:  Diagnosis Date  . Anxiety   . Aortic stenosis   . Arthritis   . Asthma   . Asymptomatic LV dysfunction, EF 45%, normal coronary arteries on cardiac cath 09/18/12 09/30/2012  . CHF (congestive heart failure) ([place])   . Chronic anticoagulation, on Xarelto prior to admit 09/30/2012  . Diabetes mellitus without complication ([place])   . DM (diabetes mellitus) ([place]) 09/30/2012  . Dysrhythmia    a fib  . Heart murmur   . Hepatitis 2003  . Hypertension   . Myocardial infarction ([place])   . Permanent atrial fibrillation, since 1994 09/30/2012   stress test 02/08/12- normal study, no significant ischemia  . S/P AVR (aortic valve replacement), 09/30/2012   a. s/p mechcanical AVR in 09/2012 (on Coumadin)  . Shortness of breath   . Sleep apnea    uses CPAP    Past Surgical History:  Procedure Laterality Date  . ABDOMINAL ANGIOGRAM  09/18/2012   Procedure: ABDOMINAL ANGIOGRAM;  Surgeon: Troy Sine, MD;  Location: [facility] C[facility] LAB;  Service: Cardiovascular;;  . AORTIC VALVE REPLACEMENT  09/29/2012   Procedure: AORTIC VALVE REPLACEMENT (AVR);  Surgeon: Gaye Pollack, MD;  Location: [place];  Service: Open Heart Surgery;  Laterality:  N/A;  . ARCH AORTOGRAM  09/18/2012   Procedure: ARCH AORTOGRAM;  Surgeon: Troy Sine, MD;  Location[facility] C[facility] LAB;  Service: Cardiovascular;;  . CARDIAC CATHETERIZATION  09/18/12   severe calcific aortic stenosis, peak gradient 79m, mean gradient 562m EF 45%  . CARDIAC VALVE REPLACEMENT     AVR 09-29-12  . CHOLECYSTECTOMY    . FRACTURE SURGERY    . LEFT AND RIGHT HEART CATHETERIZATION WITH CORONARY ANGIOGRAM N/A 09/18/2012   Procedure: LEFT AND RIGHT HEART CATHETERIZATION WITH CORONARY ANGIOGRAM;  Surgeon: ThTroy SineMD;  [facility];  Service: Cardiovascular;  Laterality: N/A;    Allergies  Allergen Reactions  . Iohexol Anaphylaxis  . Niacin And Related     Flushing with immediate realese  . Penicillins  Other (See Comments)    Unknown.[name]    Current Outpatient Medications  Medication Sig Dispense Refill  . acetaminophen (TYLENOL) 325 MG tablet Take 2 tablets (650 mg total) by mouth every 4 (four) hours as needed for pain or fever.    [name] albuterol (PROVENTIL HFA;VENTOLIN HFA) 108 (90 BASE) MCG/ACT inhaler Inhale 2 puffs into the lungs every 6 (six) hours as needed. For shortness of breath    . Ascorbic Acid (VITAMIN C) 1000 MG tablet Take 1,000 mg by mouth daily.    . B-D ULTRAFINE III SHORT PEN 31G X 8 MM MISC     . betamethasone valerate (VALISONE) 0.1 % cream Apply 1 application topically daily as needed.     . cholecalciferol (VITAMIN D) 1000 UNITS tablet Take 1,000 Units by mouth daily.    . Chromium 200 MCG CAPS Take 200 mcg by mouth daily.    . clotrimazole-betamethasone (LOTRISONE) cream Apply 1 application topically as needed.    . digoxin (LANOXIN) 0.125 MG tablet Take 1 tablet (125 mcg total) by mouth daily. 90 tablet 1  . EASY TOUCH LANCETS 30G/TWIST MISC     . empagliflozin (JARDIANCE) 25 MG TABS tablet Take 25 mg by mouth daily. 90 tablet 1  . escitalopram (LEXAPRO) 10 MG tablet Take 10 mg by mouth every evening.     .  Fluticasone-Salmeterol (ADVAIR) 250-50 MCG/DOSE AEPB Inhale 1 puff into the lungs daily. For asthma related symptoms    . furosemide (LASIX) 40 MG tablet Take 1 tablet (40 mg total) by mouth daily. 90 tablet 3  . glimepiride (AMARYL) 2 MG tablet Take 2 mg by mouth daily with breakfast.     . Glucosamine-Chondroit-Vit C-Mn (GLUCOSAMINE 1500 COMPLEX PO) Take 1,500 mg by mouth daily.    Vanessa Kick Ethyl (VASCEPA) 1 g CAPS Take 2 capsules (2 g total) by mouth 2 (two) times daily. 360 capsule 3  . insulin glargine (LANTUS) 100 UNIT/ML injection Inject 5-20 Units into the skin 2 (two) times daily. 5-20 units at breakfast and dinner depending on blood sugar levels    . Liraglutide (VICTOZA [place]) Inject 1.8 mg into the skin 1 day or 1 dose.    . metFORMIN (GLUCOPHAGE-XR) 500 MG 24 hr tablet Take 2,000 mg by mouth at bedtime.    . metoprolol succinate (TOPROL XL) 25 MG 24 hr tablet Take 0.5 tablets (12.5 mg total) by mouth daily. 45 tablet 3  . Multiple Vitamins-Minerals (CENTRUM SPECIALIST HEART PO) Take 1 tablet by mouth 2 (two) times daily.    [name] warfarin (COUMADIN) 10 MG tablet Take 10-15 mg by mouth every evening. 10 mg on Tues, Thurs, Sat and Sun and 12.5 mg on Mon, Wed, Frid    . ezetimibe (ZETIA) 10 MG tablet Take 1 tablet (10 mg total) by mouth daily. 90 tablet 0  . fenofibrate (TRICOR) 145 MG tablet Take 1 tablet (145 mg total) by mouth daily. 90 tablet 0  . irbesartan (AVAPRO) 300 MG tablet Take 1 tablet (300 mg total) by mouth daily. (Patient not taking: Reported on 09/22/2018) 90 tablet 3   No current facility-administered medications for this visit.     Social History   Socioeconomic History  . Marital status: Married    Spouse name: [name]  . Number of children: 3  . Years of education: 15+  . Highest education level: Not on file  Occupational History    Employer: [employer]  . Financial resource strain:  Not on file  . Food insecurity:    Worry: Not on file     Inability: Not on file  . Transportation needs:    Medical: Not on file    Non-medical: Not on file  Tobacco Use  . Smoking status: Former Smoker    Packs/day: 0.25    Years: 10.00    Pack years: 2.50    Types: Cigarettes    Last attempt to quit: 09/25/1982    Years since quitting: 36.0  . Smokeless tobacco: Former Network engineer and Sexual Activity  . Alcohol use: Yes    Comment: occ. beer/wine  . Drug use: No  . Sexual activity: Yes    Birth control/protection: None  Lifestyle  . Physical activity:    Days per week: Not on file    Minutes per session: Not on file  . Stress: Not on file  Relationships  . Social connections:    Talks on phone: Not on file    Gets together: Not on file    Attends religious service: Not on file    Active member of club or organization: Not on file    Attends meetings of clubs or organizations: Not on file    Relationship status: Not on file  . Intimate partner violence:    Fear of current or ex partner: Not on file    Emotionally abused: Not on file    Physically abused: Not on file    Forced sexual activity: Not on file  Other Topics Concern  . Not on file  Social History Narrative   Patient is married [name]) and lives with his wife and son.   Patient has three children.   Patient is working full-time.   Patient has a college education.   Patient is right handed.   Patient drinks about 2-3 cups of coffee daily.    Family History  Problem Relation Age of Onset  . Hypertension Mother   . Diabetes Father   . Heart attack Brother 75  . Hyperlipidemia Brother 15       stents placed    ROS General: Negative; No fevers, chills, or night sweats; positive for obesity HEENT: Negative; No changes in vision or hearing, sinus congestion, difficulty swallowing Pulmonary: Negative; No cough, wheezing, shortness of breath, hemoptysis Cardiovascular: Positive for permanent AF, status post St. Jude aortic valve replacement Rare left ankle  swelling GI: Negative; No nausea, vomiting, diarrhea, or abdominal pain GU: Negative; No dysuria, hematuria, or difficulty voiding Musculoskeletal: Negative; no myalgias, joint pain, or weakness Hematologic/Oncology: Negative; no easy bruising, bleeding Endocrine: Positive for diabetes Neuro: Negative; no changes in balance, headaches Skin: Negative; No rashes or skin lesions Psychiatric: Negative; No behavioral problems, depression Sleep: Negative; No snoring, daytime sleepiness, hypersomnolence, bruxism, restless legs, hypnogognic hallucinations, no cataplexy   PE BP 136/67   Pulse 74   Ht _0  (1.803 m)   Wt 264 lb 12.8 oz (120.1 kg)   BMI 36.93 kg/m    Repeat blood pressure was 132/68  Wt Readings from Last 3 Encounters:  09/22/18 264 lb 12.8 oz (120.1 kg)  05/22/18 263 lb 6.4 oz (119.5 kg)  10/04/17 247 lb (112 kg)   General: Alert, oriented, no distress.  Skin: normal turgor, no rashes, warm and dry HEENT: Normocephalic, atraumatic. Pupils equal round and reactive to light; sclera anicteric; extraocular muscles intact;  Nose without nasal septal hypertrophy Mouth/Parynx benign; Mallinpatti scale 3 Neck: No JVD, no carotid bruits; normal carotid upstroke  Lungs: clear to ausculatation and percussion; no wheezing or rales Chest wall: without tenderness to palpitation Heart: PMI not displaced, irregularly irregular consistent with his chronic atrial fibrillation, s1 s2 normal, 2/6 systolic murmur, 1/6 diastolic murmur, no rubs, gallops, thrills, or heaves Abdomen: soft, nontender; no hepatosplenomehaly, BS+; abdominal aorta nontender and not dilated by palpation. Back: no CVA tenderness Pulses 2+ Musculoskeletal: full range of motion, normal strength, no joint deformities Extremities: no clubbing cyanosis or edema, Homan's sign negative  Neurologic: grossly nonfocal; Cranial nerves grossly wnl Psychologic: Normal mood and affect   ECG (independently read by me):  Atrial fibrillation at 74 bpm.  Occasional PVCs.  Poor R wave progression.  Right axis deviation  May 22, 2018 ECG (independently read by me): Atrial fibrillation at 73 bpm.  Right superior axis.  Nonspecific ventricular block.  November 2018 ECG (independently read by me): Atrial fibrillation at 67 bpm, right bundle branch block  September 2018 ECG (independently read by me): atrial fibrillation with occasional PVCs.  Ventricular rate 92 bpm.  Right axis deviation.  September 2015 ECG (independently read by me): Atrial fibrillation with ventricular response of 57 beats per minute.  Nonspecific intraventricular block.  Right axis deviation.   October 2014 ECG: Atrial fibrillation with ventricular rate in the 70s. Nonspecific interventricular conduction delay. Right superior axis deviation.  LABS: I reviewed the patient's recent [facility] hospitalization, 2-D echo Doppler study, laboratory  BMP Latest Ref Rng & Units 09/22/2018 09/17/2018 04/03/2018  Glucose 65 - 99 mg/dL 186(H) 315(H) 257(H)  BUN 8 - 27 mg/dL 47(H) 48(H) 30(H)  Creatinine 0.76 - 1.27 mg/dL 1.57(H) 1.19 1.15  BUN/Creat Ratio 10 - 24 30(H) 40(H) 26(H)  Sodium 134 - 144 mmol/L 139 136 136  Potassium 3.5 - 5.2 mmol/L 5.1 5.5(H) 5.3(H)  Chloride 96 - 106 mmol/L 99 97 98  CO2 20 - 29 mmol/L 23 19(L) 23  Calcium 8.6 - 10.2 mg/dL 10.3(H) 9.8 9.9   Hepatic Function Latest Ref Rng & Units 09/17/2018 04/03/2018 08/23/2014  Total Protein 6.0 - 8.5 g/dL 7.2 7.0 6.3  Albumin 3.6 - 4.8 g/dL 4.8 4.8 4.2  AST 0 - 40 IU/L _0 ALT 0 - 44 IU/L 32 33 29  Alk Phosphatase 39 - 117 IU/L 64 51 48  Total Bilirubin 0.0 - 1.2 mg/dL 0.6 0.4 0.5   CBC Latest Ref Rng & Units 04/03/2018 08/24/2013 10/12/2012  WBC 3.4 - 10.8 x10E3/uL 7.2 9.5 10.5  Hemoglobin 13.0 - 17.7 g/dL 13.8 13.2 10.5(L)  Hematocrit 37.5 - 51.0 % 42.4 38.1(L) 31.8(L)  Platelets 150 - 379 x10E3/uL 168 189 280   Lab Results  Component Value Date   MCV 84 04/03/2018   MCV  80.9 08/24/2013   MCV 84.8 10/12/2012   Lab Results  Component Value Date   TSH 1.280 04/03/2018   Lipid Panel     Component Value Date/Time   CHOL 213 (H) 09/17/2018 0903   CHOL 166 08/23/2014 0853   TRIG 672 (HH) 09/17/2018 0903   TRIG 274 (H) 08/23/2014 0853   HDL 20 (L) 09/17/2018 0903   HDL 26 (L) 08/23/2014 0853   CHOLHDL 10.7 (H) 09/17/2018 0903   CHOLHDL 4.6 08/24/2013 1023   VLDL 38 08/24/2013 1023   [place] Comment 09/17/2018 0903   LDLCALC 85 08/23/2014 0853     RADIOLOGY: No results found.  IMPRESSION:  1. Permanent atrial fibrillation, since 1994   2. Hyperkalemia   3. Medication management   4. Severe aortic  stenosis; S/P Saint Jude AVR by [name] October 2013   5. History of aortic valve replacement   6. Essential hypertension   7. Mixed hyperlipidemia due to type 2 diabetes mellitus ([place])   8. Type 2 diabetes mellitus without complication, with long-term current use of insulin ([place])     ASSESSMENT AND PLAN: Mr.Nofziger is a 67 year-old male who is status post aortic valve replacement in October 2013 with a St. Jude aortic valve by [name].  He has a long-standing history of permanent atrial fibrillation dating back to 1994 and also has a history of hypertension, obstructive sleep apnea on CPAP therapy, and diabetes mellitus with suboptimal control.  I had not seen him in over 3 years until September 2018 after he presented following an episode of CHF.  At that time, I initiated low-dose metoprolol succinate for improved rate control and blood pressure control. When I saw him in follow-up in November 2018 his heart rate was stable in the 60s with the addition of this therapy. With his suboptimally controlled diabetes and his recent CHF, I initiated therapy with Jardiance.  He had noticed dramatic benefit since initiating therapy.  When I last saw him I recommended further titration to 25 mg daily.  Over the past several months he admits that his diet has  been very poor.  I reviewed his most recent laboratory.  His triglycerides have increased to 672 and his fasting glucose was 315.  He had tried to set an appointment to see the endocrinologist at [facility] since his primary physician is at [facility] but apparently has not been able to get an appointment scheduled in until March.  I have suggested that he contact them again and it may be necessary for him to be referred to an additional endocrinologist who can see him much sooner.  His potassium on recent laboratory was elevated which may be contributed both by evening tomatoes every day as well as drinking orange juice.  We discussed potassium restriction.  His irbesartan has been on hold. He could not tolerate the addition of Crestor and remotely could not tolerate Zocor.  I am adding Zetia 10 mg to his fenofibrate.  He may be a candidate for Repatha for PCSK9 inhibition if he is statin intolerant.  He has noticed some trace ankle edema.  He admits to a 16 pound weight gain in over the past year.  I reviewed his echo Doppler study again with him which showed a well-seated mechanical aortic prosthesis with mild AR.  He has mitral annular calcification with mild MR.  His atrial fibrillation rate is controlled.  He is on warfarin for anticoagulation both for his mechanical aortic prosthesis as well as his chronic atrial fibrillation.  Repeat laboratory will be obtained in 4 weeks consisting of chemistry and lipid studies and I will see him in 6 weeks for reevaluation   Time spent: 25 minutes Troy Sine, MD, [facility]  09/24/2018 11:17 AM

## 2018-09-23 LAB — BASIC METABOLIC PANEL
BUN / CREAT RATIO: 30 — AB (ref 10–24)
BUN: 47 mg/dL — ABNORMAL HIGH (ref 8–27)
CO2: 23 mmol/L (ref 20–29)
Calcium: 10.3 mg/dL — ABNORMAL HIGH (ref 8.6–10.2)
Chloride: 99 mmol/L (ref 96–106)
Creatinine, Ser: 1.57 mg/dL — ABNORMAL HIGH (ref 0.76–1.27)
GFR calc Af Amer: 51 mL/min/{1.73_m2} — ABNORMAL LOW (ref >59)
GFR, EST NON AFRICAN AMERICAN: 44 mL/min/{1.73_m2} — AB (ref >59)
Glucose: 186 mg/dL — ABNORMAL HIGH (ref 65–99)
POTASSIUM: 5.1 mmol/L (ref 3.5–5.2)
SODIUM: 139 mmol/L (ref 134–144)

## 2018-09-24 ENCOUNTER — Encounter: Payer: Self-pay | Admitting: Cardiovascular Disease

## 2018-09-24 ENCOUNTER — Telehealth: Payer: Self-pay | Admitting: Cardiovascular Disease

## 2018-09-24 NOTE — Telephone Encounter (Signed)
Follow Up:    Returning your cal from this morning, concerning his lab results.

## 2018-09-25 ENCOUNTER — Telehealth: Payer: Self-pay | Admitting: Cardiovascular Disease

## 2018-09-25 ENCOUNTER — Other Ambulatory Visit: Payer: Self-pay

## 2018-09-25 DIAGNOSIS — Z79899 Other long term (current) drug therapy: Secondary | ICD-10-CM

## 2018-09-25 DIAGNOSIS — E875 Hyperkalemia: Secondary | ICD-10-CM

## 2018-09-25 NOTE — Telephone Encounter (Signed)
Pt wife called for lab results and per her request notes and labs faxed to Dr. Elyse Hsu with [facility] faxed to Dr. Elyse Hsu for his consult per Dr. Evette Georges recommendation. Pt to return in 2 weeks for repeat BMET.

## 2018-09-25 NOTE — Telephone Encounter (Signed)
New Message:   Wife called and said Dr Altheimer will need a referral from Dr Claiborne Billings before he will see the the patient. He also would like pt's last lab work. He needs both of these faxed to [phone].

## 2018-09-25 NOTE — Telephone Encounter (Signed)
Follow Up:    Wife would also like to know the lab results from pt's Potassium .

## 2018-10-01 DIAGNOSIS — G4733 Obstructive sleep apnea (adult) (pediatric): Secondary | ICD-10-CM | POA: Diagnosis not present

## 2018-10-10 DIAGNOSIS — Z7901 Long term (current) use of anticoagulants: Secondary | ICD-10-CM | POA: Diagnosis not present

## 2018-10-10 DIAGNOSIS — I4891 Unspecified atrial fibrillation: Secondary | ICD-10-CM | POA: Diagnosis not present

## 2018-10-10 DIAGNOSIS — J45909 Unspecified asthma, uncomplicated: Secondary | ICD-10-CM | POA: Diagnosis not present

## 2018-10-17 DIAGNOSIS — Z7901 Long term (current) use of anticoagulants: Secondary | ICD-10-CM | POA: Diagnosis not present

## 2018-10-20 DIAGNOSIS — Z794 Long term (current) use of insulin: Secondary | ICD-10-CM | POA: Diagnosis not present

## 2018-10-20 DIAGNOSIS — E1142 Type 2 diabetes mellitus with diabetic polyneuropathy: Secondary | ICD-10-CM | POA: Diagnosis not present

## 2018-10-24 DIAGNOSIS — I4891 Unspecified atrial fibrillation: Secondary | ICD-10-CM | POA: Diagnosis not present

## 2018-10-24 DIAGNOSIS — Z7901 Long term (current) use of anticoagulants: Secondary | ICD-10-CM | POA: Diagnosis not present

## 2018-11-03 DIAGNOSIS — I4891 Unspecified atrial fibrillation: Secondary | ICD-10-CM | POA: Diagnosis not present

## 2018-11-03 DIAGNOSIS — Z7901 Long term (current) use of anticoagulants: Secondary | ICD-10-CM | POA: Diagnosis not present

## 2018-11-17 DIAGNOSIS — Z7901 Long term (current) use of anticoagulants: Secondary | ICD-10-CM | POA: Diagnosis not present

## 2018-11-21 DIAGNOSIS — E782 Mixed hyperlipidemia: Secondary | ICD-10-CM | POA: Diagnosis not present

## 2018-11-21 DIAGNOSIS — E1169 Type 2 diabetes mellitus with other specified complication: Secondary | ICD-10-CM | POA: Diagnosis not present

## 2018-11-21 DIAGNOSIS — I10 Essential (primary) hypertension: Secondary | ICD-10-CM | POA: Diagnosis not present

## 2018-11-21 DIAGNOSIS — I4821 Permanent atrial fibrillation: Secondary | ICD-10-CM | POA: Diagnosis not present

## 2018-11-21 DIAGNOSIS — E119 Type 2 diabetes mellitus without complications: Secondary | ICD-10-CM | POA: Diagnosis not present

## 2018-11-21 DIAGNOSIS — Z794 Long term (current) use of insulin: Secondary | ICD-10-CM | POA: Diagnosis not present

## 2018-11-21 DIAGNOSIS — Z79899 Other long term (current) drug therapy: Secondary | ICD-10-CM | POA: Diagnosis not present

## 2018-11-22 LAB — LIPID PANEL
CHOLESTEROL TOTAL: 155 mg/dL (ref 100–199)
Chol/HDL Ratio: 6.2 ratio — ABNORMAL HIGH (ref 0.0–5.0)
HDL: 25 mg/dL — AB (ref >39)
LDL Calculated: 79 mg/dL (ref 0–99)
TRIGLYCERIDES: 253 mg/dL — AB (ref 0–149)
VLDL CHOLESTEROL CAL: 51 mg/dL — AB (ref 5–40)

## 2018-11-22 LAB — COMPREHENSIVE METABOLIC PANEL
A/G RATIO: 2.2 (ref 1.2–2.2)
ALT: 46 IU/L — AB (ref 0–44)
AST: 43 IU/L — AB (ref 0–40)
Albumin: 4.7 g/dL (ref 3.6–4.8)
Alkaline Phosphatase: 43 IU/L (ref 39–117)
BILIRUBIN TOTAL: 0.6 mg/dL (ref 0.0–1.2)
BUN/Creatinine Ratio: 24 (ref 10–24)
BUN: 37 mg/dL — AB (ref 8–27)
CHLORIDE: 103 mmol/L (ref 96–106)
CO2: 22 mmol/L (ref 20–29)
Calcium: 10.2 mg/dL (ref 8.6–10.2)
Creatinine, Ser: 1.52 mg/dL — ABNORMAL HIGH (ref 0.76–1.27)
GFR calc non Af Amer: 46 mL/min/{1.73_m2} — ABNORMAL LOW (ref >59)
GFR, EST AFRICAN AMERICAN: 53 mL/min/{1.73_m2} — AB (ref >59)
GLUCOSE: 112 mg/dL — AB (ref 65–99)
Globulin, Total: 2.1 g/dL (ref 1.5–4.5)
POTASSIUM: 5.2 mmol/L (ref 3.5–5.2)
Sodium: 140 mmol/L (ref 134–144)
TOTAL PROTEIN: 6.8 g/dL (ref 6.0–8.5)

## 2018-11-25 ENCOUNTER — Other Ambulatory Visit: Payer: Self-pay | Admitting: *Deleted

## 2018-11-25 DIAGNOSIS — R945 Abnormal results of liver function studies: Secondary | ICD-10-CM

## 2018-11-25 DIAGNOSIS — Z79899 Other long term (current) drug therapy: Secondary | ICD-10-CM

## 2018-11-25 DIAGNOSIS — R7989 Other specified abnormal findings of blood chemistry: Secondary | ICD-10-CM

## 2018-12-05 DIAGNOSIS — Z7901 Long term (current) use of anticoagulants: Secondary | ICD-10-CM | POA: Diagnosis not present

## 2018-12-05 DIAGNOSIS — I4891 Unspecified atrial fibrillation: Secondary | ICD-10-CM | POA: Diagnosis not present

## 2018-12-11 ENCOUNTER — Encounter: Payer: Self-pay | Admitting: Cardiovascular Disease

## 2018-12-11 ENCOUNTER — Ambulatory Visit (INDEPENDENT_AMBULATORY_CARE_PROVIDER_SITE_OTHER): Payer: Medicare Other | Admitting: Cardiovascular Disease

## 2018-12-11 VITALS — BP 141/54 | HR 73 | Ht 71.0 in | Wt 267.8 lb

## 2018-12-11 DIAGNOSIS — I10 Essential (primary) hypertension: Secondary | ICD-10-CM | POA: Diagnosis not present

## 2018-12-11 DIAGNOSIS — E785 Hyperlipidemia, unspecified: Secondary | ICD-10-CM | POA: Diagnosis not present

## 2018-12-11 DIAGNOSIS — E119 Type 2 diabetes mellitus without complications: Secondary | ICD-10-CM | POA: Diagnosis not present

## 2018-12-11 DIAGNOSIS — E1169 Type 2 diabetes mellitus with other specified complication: Secondary | ICD-10-CM | POA: Diagnosis not present

## 2018-12-11 DIAGNOSIS — Z6837 Body mass index (BMI) 37.0-37.9, adult: Secondary | ICD-10-CM

## 2018-12-11 DIAGNOSIS — G4733 Obstructive sleep apnea (adult) (pediatric): Secondary | ICD-10-CM

## 2018-12-11 DIAGNOSIS — E875 Hyperkalemia: Secondary | ICD-10-CM

## 2018-12-11 DIAGNOSIS — Z794 Long term (current) use of insulin: Secondary | ICD-10-CM

## 2018-12-11 DIAGNOSIS — E782 Mixed hyperlipidemia: Secondary | ICD-10-CM

## 2018-12-11 DIAGNOSIS — Z7901 Long term (current) use of anticoagulants: Secondary | ICD-10-CM | POA: Diagnosis not present

## 2018-12-11 DIAGNOSIS — Z952 Presence of prosthetic heart valve: Secondary | ICD-10-CM

## 2018-12-11 DIAGNOSIS — I4821 Permanent atrial fibrillation: Secondary | ICD-10-CM | POA: Diagnosis not present

## 2018-12-11 MED ORDER — FUROSEMIDE 40 MG PO TABS: 40.0000 mg | ORAL_TABLET | Freq: Every day | ORAL | 3 refills | Status: DC

## 2018-12-11 MED ORDER — EZETIMIBE 10 MG PO TABS: 10.0000 mg | ORAL_TABLET | Freq: Every day | ORAL | 0 refills | Status: DC

## 2018-12-11 MED ORDER — DIGOXIN 125 MCG PO TABS: 125.0000 ug | ORAL_TABLET | Freq: Every day | ORAL | 1 refills | Status: DC

## 2018-12-11 MED ORDER — FENOFIBRATE 145 MG PO TABS: 145.0000 mg | ORAL_TABLET | Freq: Every day | ORAL | 0 refills | Status: DC

## 2018-12-11 MED ORDER — METOPROLOL SUCCINATE ER 25 MG PO TB24: 25.0000 mg | ORAL_TABLET | Freq: Every day | ORAL | 2 refills | Status: DC

## 2018-12-11 MED ORDER — EMPAGLIFLOZIN 25 MG PO TABS: 25.0000 mg | ORAL_TABLET | Freq: Every day | ORAL | 1 refills | Status: DC

## 2018-12-11 MED ORDER — WARFARIN SODIUM 5 MG PO TABS: ORAL_TABLET | ORAL | 1 refills | Status: DC

## 2018-12-11 NOTE — Progress Notes (Signed)
Patient ID: [name], male   DOB: Sep 23, 1951, 68 y.o.   MRN: [id number]        HPI: Joseph Hoffman, is a 68 y.o. male  who presents for a 3 month followup cardiology evaluation.  Joseph Hoffman has a history of permanent atrial fibrillation dating back to 19. He has a history of type 2 diabetes mellitus. He developed progressive aortic valve stenosis and 09/29/2012 underwent St. Jude aortic valve replacement by Joseph Hoffman. Additional problems also include hypertension, obstructive sleep apnea on CPAP and last year he obtained a new CPAP machine.  He had some transient CHF symptoms after his Lasix had been held following his surgery. He sees Dr. Wilson Hoffman for his diabetes.   An echo Doppler study in October 2013.  Following his surgery showed an EF of 55-60% and a St. Jude aortic valve was well seated with peak and mean gradients of 30 and 22 mm.  There was no mention of aortic insufficiency.  When I last saw him, his weight had been fairly stable  but he's been unable to lose additional weight.  AF, rate was controlled.  He had noted some intermittent ankle edema.  Since I last saw 3 years ago he retired in December 2016.  He was seen in July 2018 by Joseph Hoffman for preoperative clearance prior to undergoing colonoscopy.  He has been on Coumadin both for atrial fibrillation as well as his mechanical valve.  He was recently hospitalized overnight on September 11 and discharged on 08/14/2017 at [facility] in [place]. I reviewed these records.  He presented with acute on chronic CHF exacerbation and received IV Lasix.  His troponin was mildly increased secondary to acute onset CHF exacerbation.  His blood sugar was elevated.  He was discharged following day.  NTproBNP was markedly elevated at 1574. He underwent an echo Doppler study which showed an EF of 50-55%.  The mitral valve leaflets were calcified and there was 2+ MR.  His prosthetic aortic valve was not well visualized and there  was 2+ moderate aortic regurgitation and moderate tricuspid regurgitation.  He was in atrial fibrillation.    After not seeing him in over 3 years, I saw him for evaluation in 2018 following his hospitalization.  He has permanent atrial fibrillation and I recommended initiation of Toprol-XL will 0.5 mg daily for improved rate control and blood pressure control.  With his stable renal function.  I felt he was a good candidate for Jardiance with reference to his diabetes mellitus and particularly with his recent CHF episode and initiated therapy with 10 mg.   Since I last saw him in November 2018, he underwent a follow-up echo Doppler study on Apr 03, 2018.  This showed mild LVH with normal systolic function with an EF of 50 to 55% without regional wall motion abnormalities.  His mechanical prosthesis in the aortic valve was well-seated.  Mean aortic gradient was 26 mm with a peak gradient of 36 mm.  There is mild AR.  There was mitral annular calcification with mild MR.  He had moderate to severe biatrial enlargement.  RV size is mildly dilated.  He had seen Joseph Hoffman to reestablish primary care since Dr. Wilson Hoffman had retired.  No adjustments were made to his regimen.  He is now employed as a Government social research officer for [employer].  He denies any chest pain or CHF symptoms.  He has noticed significant improvement since I initiated Jardiance.  He denies any shortness of  breath , palpitations, PND orthopnea.  He had undergone laboratory on Apr 03, 2018 which showed a cholesterol of 184, HDL 26, LDL 90, but triglycerides were elevated at 339.  I initiated Vascepa 2 capsules twice a day.   I  saw him in June 2019.  At that time I recommended a trial of low-dose Crestor 10 mg 2 times per week.  I also recommended further titration of Jardiance to 25 mg daily.  When I last saw him in October 2019 he admitted that he was not doing well with reference to his diet.  He had stopped taking Crestor.  He was having a lot of  tomatoes and cheese.  He had seen his primary doctor and apparently there is been significant delay in him being seen by endocrinology in [place] with an initial appointment set for late March 2020.  He underwent a follow-up echo Doppler study on Apr 03, 2018 which showed normal LV function.  He had a well-seated mechanical aortic valve prosthesis with mild AR.  There was mitral annular calcification with mild MR and evidence for biatrial enlargement.  He recently had laboratory which confirmed he is not been doing well with his diet.  Total cholesterol was 213 but triglycerides had risen to 672, HDL was low at 20 and LDL could not be calculated.  His potassium was 5.5.  Glucose was 315.  Hemoglobin A1c was markedly elevated at 10.1.  His potassium elevation I spent considerable time with him discussing reduction of potassium containing foods.  Irbesartan was held.  He was started on for CIBA 2 capsules twice a day since he could not tolerate Crestor I added Zetia 10 mg to his fenofibrate.  Repeat laboratory on November 21, 2018 showed a creatinine of 1.52, potassium 5.2.  But studies were significantly improved with total cholesterol improving from 213 down to 155, triglycerides from 672 down to 253 the LDL was still also elevated but improved at 51.  LDL was 79.  He is now established endocrinologic care with Joseph Hoffman at [facility] [place].  He denies chest pain or shortness of breath.  He feels well.  He presents for reevaluation.   Past Medical History:  Diagnosis Date  . Anxiety   . Aortic stenosis   . Arthritis   . Asthma   . Asymptomatic LV dysfunction, EF 45%, normal coronary arteries on cardiac cath 09/18/12 09/30/2012  . CHF (congestive heart failure) ([place])   . Chronic anticoagulation, on Xarelto prior to admit 09/30/2012  . Diabetes mellitus without complication ([place])   . DM (diabetes mellitus) ([place]) 09/30/2012  . Dysrhythmia    a fib  . Heart murmur   . Hepatitis 2003  .  Hypertension   . Myocardial infarction ([place])   . Permanent atrial fibrillation, since 1994 09/30/2012   stress test 02/08/12- normal study, no significant ischemia  . S/P AVR (aortic valve replacement), 09/30/2012   a. s/p mechcanical AVR in 09/2012 (on Coumadin)  . Shortness of breath   . Sleep apnea    uses CPAP    Past Surgical History:  Procedure Laterality Date  . ABDOMINAL ANGIOGRAM  09/18/2012   Procedure: ABDOMINAL ANGIOGRAM;  Surgeon: Troy Sine, MD;  Location: [facility] CATH LAB;  Service: Cardiovascular;;  . AORTIC VALVE REPLACEMENT  09/29/2012   Procedure: AORTIC VALVE REPLACEMENT (AVR);  Surgeon: Gaye Pollack, MD;  Location: [facility];  Service: Open Heart Surgery;  Laterality: N/A;  . ARCH AORTOGRAM  09/18/2012   Procedure: ARCH AORTOGRAM;  Surgeon: Troy Sine, MD;  Location: [facility] CATH LAB;  Service: Cardiovascular;;  . CARDIAC CATHETERIZATION  09/18/12   severe calcific aortic stenosis, peak gradient 42m, mean gradient 523m EF 45%  . CARDIAC VALVE REPLACEMENT     AVR 09-29-12  . CHOLECYSTECTOMY    . FRACTURE SURGERY    . LEFT AND RIGHT HEART CATHETERIZATION WITH CORONARY ANGIOGRAM N/A 09/18/2012   Procedure: LEFT AND RIGHT HEART CATHETERIZATION WITH CORONARY ANGIOGRAM;  Surgeon: ThTroy SineMD;  [facility];  Service: Cardiovascular;  Laterality: N/A;    Allergies  Allergen Reactions  . Iohexol Anaphylaxis  . Niacin And Related     Flushing with immediate realese  . Penicillins Other (See Comments)    Unknown..aMarland Kitchenrtic stenosis a child    Current Outpatient Medications  Medication Sig Dispense Refill  . acetaminophen (TYLENOL) 325 MG tablet Take 2 tablets (650 mg total) by mouth every 4 (four) hours as needed for pain or fever.    . Marland Kitchenlbuterol (PROVENTIL HFA;VENTOLIN HFA) 108 (90 BASE) MCG/ACT inhaler Inhale 2 puffs into the lungs every 6 (six) hours as needed. For shortness of breath    . Ascorbic Acid (VITAMIN C) 1000 MG tablet Take 1,000 mg by mouth  daily.    . B-D ULTRAFINE III SHORT PEN 31G X 8 MM MISC     . betamethasone valerate (VALISONE) 0.1 % cream Apply 1 application topically daily as needed.     . cholecalciferol (VITAMIN D) 1000 UNITS tablet Take 1,000 Units by mouth daily.    . Chromium 200 MCG CAPS Take 200 mcg by mouth daily.    . clotrimazole-betamethasone (LOTRISONE) cream Apply 1 application topically as needed.    . digoxin (LANOXIN) 0.125 MG tablet Take 1 tablet (125 mcg total) by mouth daily. 90 tablet 1  . EASY TOUCH LANCETS 30G/TWIST MISC     . empagliflozin (JARDIANCE) 25 MG TABS tablet Take 25 mg by mouth daily. 90 tablet 1  . escitalopram (LEXAPRO) 10 MG tablet Take 10 mg by mouth every evening.     . ezetimibe (ZETIA) 10 MG tablet Take 1 tablet (10 mg total) by mouth daily. 90 tablet 0  . fenofibrate (TRICOR) 145 MG tablet Take 1 tablet (145 mg total) by mouth daily. 90 tablet 0  . Fluticasone-Salmeterol (ADVAIR) 250-50 MCG/DOSE AEPB Inhale 1 puff into the lungs daily. For asthma related symptoms    . furosemide (LASIX) 40 MG tablet Take 1 tablet (40 mg total) by mouth daily. 90 tablet 3  . glimepiride (AMARYL) 2 MG tablet Take 2 mg by mouth daily with breakfast.     . Glucosamine-Chondroit-Vit C-Mn (GLUCOSAMINE 1500 COMPLEX PO) Take 1,500 mg by mouth daily.    . Vanessa Kickthyl (VASCEPA) 1 g CAPS Take 2 capsules (2 g total) by mouth 2 (two) times daily. 360 capsule 3  . insulin glargine (LANTUS) 100 UNIT/ML injection Inject 5-20 Units into the skin 2 (two) times daily. 5-20 units at breakfast and dinner depending on blood sugar levels    . Liraglutide (VICTOZA [place]) Inject 1.8 mg into the skin 1 day or 1 dose.    . metFORMIN (GLUCOPHAGE-XR) 500 MG 24 hr tablet Take 2,000 mg by mouth at bedtime.    . metoprolol succinate (TOPROL XL) 25 MG 24 hr tablet Take 1 tablet (25 mg total) by mouth daily. 90 tablet 2  . Multiple Vitamins-Minerals (CENTRUM SPECIALIST HEART PO) Take 1 tablet by mouth  2 (two) times daily.    [name]  warfarin (COUMADIN) 5 MG tablet Take as instructed with 10 mg. 90 tablet 1   No current facility-administered medications for this visit.     Social History   Socioeconomic History  . Marital status: Married    Spouse name: [name]  . Number of children: 3  . Years of education: 15+  . Highest education level: Not on file  Occupational History    Employer: [employer]  . Financial resource strain: Not on file  . Food insecurity:    Worry: Not on file    Inability: Not on file  . Transportation needs:    Medical: Not on file    Non-medical: Not on file  Tobacco Use  . Smoking status: Former Smoker    Packs/day: 0.25    Years: 10.00    Pack years: 2.50    Types: Cigarettes    Last attempt to quit: 09/25/1982    Years since quitting: 36.2  . Smokeless tobacco: Former Network engineer and Sexual Activity  . Alcohol use: Yes    Comment: occ. beer/wine  . Drug use: No  . Sexual activity: Yes    Birth control/protection: None  Lifestyle  . Physical activity:    Days per week: Not on file    Minutes per session: Not on file  . Stress: Not on file  Relationships  . Social connections:    Talks on phone: Not on file    Gets together: Not on file    Attends religious service: Not on file    Active member of club or organization: Not on file    Attends meetings of clubs or organizations: Not on file    Relationship status: Not on file  . Intimate partner violence:    Fear of current or ex partner: Not on file    Emotionally abused: Not on file    Physically abused: Not on file    Forced sexual activity: Not on file  Other Topics Concern  . Not on file  Social History Narrative   Patient is married [name]) and lives with his wife and son.   Patient has three children.   Patient is working full-time.   Patient has a college education.   Patient is right handed.   Patient drinks about 2-3 cups of coffee daily.    Family History  Problem Relation Age of  Onset  . Hypertension Mother   . Diabetes Father   . Heart attack Brother 76  . Hyperlipidemia Brother 56       stents placed    ROS General: Negative; No fevers, chills, or night sweats; positive for obesity HEENT: Negative; No changes in vision or hearing, sinus congestion, difficulty swallowing Pulmonary: Negative; No cough, wheezing, shortness of breath, hemoptysis Cardiovascular: Positive for permanent AF, status post St. Jude aortic valve replacement Rare left ankle swelling GI: Negative; No nausea, vomiting, diarrhea, or abdominal pain GU: Negative; No dysuria, hematuria, or difficulty voiding Musculoskeletal: Negative; no myalgias, joint pain, or weakness Hematologic/Oncology: Negative; no easy bruising, bleeding Endocrine: Positive for diabetes Neuro: Negative; no changes in balance, headaches Skin: Negative; No rashes or skin lesions Psychiatric: Negative; No behavioral problems, depression Sleep: OSA on CPAP; no residual snoring, daytime sleepiness, hypersomnolence, bruxism, restless legs, hypnogognic hallucinations, no cataplexy   PE BP (!) 141/54   Pulse 73   Ht _0  (1.803 m)   Wt 267 lb 12.8 oz (121.5 kg)  BMI 37.35 kg/m    Repeat blood pressure by me was 148/64  Wt Readings from Last 3 Encounters:  12/11/18 267 lb 12.8 oz (121.5 kg)  09/22/18 264 lb 12.8 oz (120.1 kg)  05/22/18 263 lb 6.4 oz (119.5 kg)   General: Alert, oriented, no distress.  Skin: normal turgor, no rashes, warm and dry HEENT: Normocephalic, atraumatic. Pupils equal round and reactive to light; sclera anicteric; extraocular muscles intact; Nose without nasal septal hypertrophy Mouth/Parynx benign; Mallinpatti scale 3 Neck: No JVD, no carotid bruits; normal carotid upstroke Lungs: clear to ausculatation and percussion; no wheezing or rales Chest wall: without tenderness to palpitation Heart: PMI not displaced, irregularly irregular consistent with his atrial fibrillation, s1 s2  normal, 1/6 systolic murmur, no diastolic murmur, no rubs, gallops, thrills, or heaves Abdomen: soft, nontender; no hepatosplenomehaly, BS+; abdominal aorta nontender and not dilated by palpation. Back: no CVA tenderness Pulses 2+ Musculoskeletal: full range of motion, normal strength, no joint deformities Extremities: no clubbing cyanosis or edema, Homan's sign negative  Neurologic: grossly nonfocal; Cranial nerves grossly wnl Psychologic: Normal mood and affect  ECG (independently read by me): Atrial fibrillation with a ventricular rate at 73 bpm.  Right bundle branch block.  Left anterior hemiblock.  September 22, 2018 ECG (independently read by me): Atrial fibrillation at 74 bpm.  Occasional PVCs.  Poor R wave progression.  Right axis deviation  May 22, 2018 ECG (independently read by me): Atrial fibrillation at 73 bpm.  Right superior axis.  Nonspecific ventricular block.  November 2018 ECG (independently read by me): Atrial fibrillation at 67 bpm, right bundle branch block  September 2018 ECG (independently read by me): atrial fibrillation with occasional PVCs.  Ventricular rate 92 bpm.  Right axis deviation.  September 2015 ECG (independently read by me): Atrial fibrillation with ventricular response of 57 beats per minute.  Nonspecific intraventricular block.  Right axis deviation.   October 2014 ECG: Atrial fibrillation with ventricular rate in the 70s. Nonspecific interventricular conduction delay. Right superior axis deviation.  LABS: I reviewed the patient's recent [facility] hospitalization, 2-D echo Doppler study, laboratory  BMP Latest Ref Rng & Units 11/21/2018 09/22/2018 09/17/2018  Glucose 65 - 99 mg/dL 112(H) 186(H) 315(H)  BUN 8 - 27 mg/dL 37(H) 47(H) 48(H)  Creatinine 0.76 - 1.27 mg/dL 1.52(H) 1.57(H) 1.19  BUN/Creat Ratio 10 - 24 24 30(H) 40(H)  Sodium 134 - 144 mmol/L 140 139 136  Potassium 3.5 - 5.2 mmol/L 5.2 5.1 5.5(H)  Chloride 96 - 106 mmol/L 103 99 97  CO2 20  - 29 mmol/L 22 23 19(L)  Calcium 8.6 - 10.2 mg/dL 10.2 10.3(H) 9.8   Hepatic Function Latest Ref Rng & Units 11/21/2018 09/17/2018 04/03/2018  Total Protein 6.0 - 8.5 g/dL 6.8 7.2 7.0  Albumin 3.6 - 4.8 g/dL 4.7 4.8 4.8  AST 0 - 40 IU/L 43(H) 27 24  ALT 0 - 44 IU/L 46(H) 32 33  Alk Phosphatase 39 - 117 IU/L 43 64 51  Total Bilirubin 0.0 - 1.2 mg/dL 0.6 0.6 0.4   CBC Latest Ref Rng & Units 04/03/2018 08/24/2013 10/12/2012  WBC 3.4 - 10.8 x10E3/uL 7.2 9.5 10.5  Hemoglobin 13.0 - 17.7 g/dL 13.8 13.2 10.5(L)  Hematocrit 37.5 - 51.0 % 42.4 38.1(L) 31.8(L)  Platelets 150 - 379 x10E3/uL 168 189 280   Lab Results  Component Value Date   MCV 84 04/03/2018   MCV 80.9 08/24/2013   MCV 84.8 10/12/2012   Lab Results  Component Value Date  TSH 1.280 04/03/2018   Lipid Panel     Component Value Date/Time   CHOL 155 11/21/2018 0849   CHOL 166 08/23/2014 0853   TRIG 253 (H) 11/21/2018 0849   TRIG 274 (H) 08/23/2014 0853   HDL 25 (L) 11/21/2018 0849   HDL 26 (L) 08/23/2014 0853   CHOLHDL 6.2 (H) 11/21/2018 0849   CHOLHDL 4.6 08/24/2013 1023   VLDL 38 08/24/2013 1023   LDLCALC 79 11/21/2018 0849   LDLCALC 85 08/23/2014 0853     RADIOLOGY: No results found.  IMPRESSION:  1. Essential hypertension   2. Permanent atrial fibrillation, since 1994   3. History of aortic valve replacement   4. Current use of long term anticoagulation   5. Type 2 diabetes mellitus without complication, with long-term current use of insulin ([place])   6. Obstructive sleep apnea syndrome   7. Hyperkalemia   8. Mixed hyperlipidemia due to type 2 diabetes mellitus ([place])   9. Hyperlipidemia with target LDL less than 70   10. Class 2 severe obesity due to excess calories with serious comorbidity and body mass index (BMI) of 37.0 to 37.9 in adult [facility])     ASSESSMENT AND PLAN: JosephBouch is a 68 year-old male who is status post aortic valve replacement in October 2013 with a St. Jude aortic valve by Joseph Hoffman.   He has a long-standing history of permanent atrial fibrillation dating back to 1994 and also has a history of hypertension, obstructive sleep apnea on CPAP therapy, and diabetes mellitus with suboptimal control.  I had not seen him in over 3 years until September 2018 after he presented following an episode of CHF.  At that time, I initiated low-dose metoprolol succinate for improved rate control and blood pressure control. When I saw him in follow-up in November 2018 his heart rate was stable in the 60s with the addition of this therapy. With his suboptimally controlled diabetes and his recent CHF, I initiated therapy with Jardiance.  He had noticed dramatic benefit since initiating therapy.  At f/u  office visit I recommended further titration to 25 mg daily.  When I last saw him he admitted to extremely poor diet.  Glucose had increased to 186, triglycerides were 672 any was hyperkalemic contributed by excess potassium containing foods.  Since October he has been off irbesartan his recent creatinine is 1.52.  Is 5.2.  Pressure today is elevated.  I suggested additional titration of Toprol-XL to 25 mg daily which will also be helpful for his rate control of his permanent atrial fibrillation.  He is on furosemide 40 mg daily in addition to [place] which have been helpful for his previous edema which has improved.  He continues to be on warfarin for anticoagulation.  Pressure continues to be increased I would recommend initiation of amlodipine.  Currently he has continued to be on metformin and Victoza but was given a trial of Ozempic by Dr. Kizzie Fantasia but he did not tolerate this secondary to nausea and vomiting. He is now back on Victoza metformin, Jardiance, glimepiride in addition to insulin.  Studies are improved but triglycerides and VLDL are still elevated.  He may be y be a candidate for Repatha for PCSK9 inhibition in the future.  We discussed the importance of increased exercise and weight loss.  Has not been  active and typically spends an hour and a half in the car commuting to [place] in the morning and evening to work.  His wife states that he basically  just sits down most of the day.  We discussed the benefit of increased exercise both with reference to his diabetes mellitus as well as sleep apnea and heart disease.     Time spent: 25 minutes Troy Sine, MD, [facility]  12/12/2018 6:54 PM

## 2018-12-11 NOTE — Patient Instructions (Signed)
Medication Instructions:  All refills sent to pharmacy.  If you need a refill on your cardiac medications before your next appointment, please call your pharmacy.   Lab work: Fasting CMET, Lipid in 3 months.  If you have labs (blood work) drawn today and your tests are completely normal, you will receive your results only by: [name] MyChart Message (if you have MyChart) OR . A paper copy in the mail If you have any lab test that is abnormal or we need to change your treatment, we will call you to review the results.   Follow-Up: At [facility], you and your health needs are our priority.  As part of our continuing mission to provide you with exceptional heart care, we have created designated Provider Care Teams.  These Care Teams include your primary Cardiologist (physician) and Advanced Practice Providers (APPs -  Physician Assistants and Nurse Practitioners) who all work together to provide you with the care you need, when you need it. You will need a follow up appointment in 4 months.  Please call our office 1 months in advance to schedule this appointment.  You may see Dr.Kelly or one of the following Advanced Practice Providers on your designated Care Team: Almyra Deforest, [place] . Fabian Sharp, PA-C

## 2018-12-12 ENCOUNTER — Encounter: Payer: Self-pay | Admitting: Cardiovascular Disease

## 2019-01-02 DIAGNOSIS — R252 Cramp and spasm: Secondary | ICD-10-CM | POA: Diagnosis not present

## 2019-01-02 DIAGNOSIS — R350 Frequency of micturition: Secondary | ICD-10-CM | POA: Diagnosis not present

## 2019-01-02 DIAGNOSIS — Z7901 Long term (current) use of anticoagulants: Secondary | ICD-10-CM | POA: Diagnosis not present

## 2019-01-02 DIAGNOSIS — I4891 Unspecified atrial fibrillation: Secondary | ICD-10-CM | POA: Diagnosis not present

## 2019-01-02 DIAGNOSIS — N183 Chronic kidney disease, stage 3 (moderate): Secondary | ICD-10-CM | POA: Diagnosis not present

## 2019-01-06 ENCOUNTER — Telehealth: Payer: Self-pay | Admitting: Cardiovascular Disease

## 2019-01-06 ENCOUNTER — Encounter: Payer: Self-pay | Admitting: Physician Assistant

## 2019-01-06 ENCOUNTER — Ambulatory Visit: Payer: Medicare Other | Admitting: Physician Assistant

## 2019-01-06 ENCOUNTER — Ambulatory Visit (INDEPENDENT_AMBULATORY_CARE_PROVIDER_SITE_OTHER): Payer: Medicare Other | Admitting: Physician Assistant

## 2019-01-06 VITALS — BP 118/76 | HR 55 | Ht 71.0 in | Wt 263.2 lb

## 2019-01-06 DIAGNOSIS — Z7901 Long term (current) use of anticoagulants: Secondary | ICD-10-CM | POA: Diagnosis not present

## 2019-01-06 DIAGNOSIS — I4821 Permanent atrial fibrillation: Secondary | ICD-10-CM

## 2019-01-06 DIAGNOSIS — I5023 Acute on chronic systolic (congestive) heart failure: Secondary | ICD-10-CM | POA: Diagnosis not present

## 2019-01-06 DIAGNOSIS — Z952 Presence of prosthetic heart valve: Secondary | ICD-10-CM | POA: Diagnosis not present

## 2019-01-06 NOTE — Progress Notes (Signed)
Cardiology Office Note   Date:  01/06/2019   ID:  Zadin, Lange August 20, 1951, MRN [id number]  PCP:  Orpah Melter, MD Cardiologist:  Shelva Majestic, MD 12/11/2018 Rosaria Ferries, PA-C   No chief complaint on file.   History of Present Illness: Joseph Hoffman is a 68 y.o. male with a history of perm Afib, DM, HTN, St Jude mech AVR 2013, S-CHF, OSA on CPAP  01/09 office visit, HR stable on metoprolol, Jardiance at 25 mg not controlling DM, Cr 1.52 off irbesartan, K+ 5.2 off ARB and w/ less K+ rich foods. Edema ok on Lasix 40 mg qd, amlodipine added, may need PCSK-9, increase activity  Joseph Hoffman presents for cardiology follow up.  He developed leg cramps a couple of weeks ago, they eventually resolved.   He has developed DOE, has to stop going up stairs. +orthopnea, +PND. Cannot go to the gym due to SOB.   He is concerned because his BUN/Cr is worse than usual, blood sugar was high that day but has been ok in the morning.   His house is being remodeled, gives him problems w/ weighing daily. That is also why he is climbing the stairs more than usual. However, he weighs as often as he can, does not think he has gained any weight.   He needs to work from home, has to drive to [facility] and is having to stop several times. This is very tiring and hard on him.    Past Medical History:  Diagnosis Date  . Anxiety   . Aortic stenosis   . Arthritis   . Asthma   . Asymptomatic LV dysfunction, EF 45%, normal coronary arteries on cardiac cath 09/18/12 09/30/2012  . CHF (congestive heart failure) ([place])   . Chronic anticoagulation, on Xarelto prior to admit 09/30/2012  . DM (diabetes mellitus) ([place]) 09/30/2012  . Hepatitis 2003  . Hypertension   . Myocardial infarction ([place])   . Permanent atrial fibrillation, since 1994 09/30/2012   stress test 02/08/12- normal study, no significant ischemia  . S/P AVR (aortic valve replacement), 09/30/2012   a. s/p mechcanical AVR in 09/2012 (on  Coumadin)  . Sleep apnea    uses CPAP    Past Surgical History:  Procedure Laterality Date  . ABDOMINAL ANGIOGRAM  09/18/2012   Procedure: ABDOMINAL ANGIOGRAM;  Surgeon: Troy Sine, MD;  Location: [facility] C[facility] LAB;  Service: Cardiovascular;;  . AORTIC VALVE REPLACEMENT  09/29/2012   Procedure: AORTIC VALVE REPLACEMENT (AVR);  Surgeon: Gaye Pollack, MD;  Location: [facility];  Service: Open Heart Surgery;  Laterality: N/A;  . ARCH AORTOGRAM  09/18/2012   Procedure: ARCH AORTOGRAM;  Surgeon: Troy Sine, MD;  Location: [facility] CATH LAB;  Service: Cardiovascular;;  . CARDIAC CATHETERIZATION  09/18/12   severe calcific aortic stenosis, peak gradient 33mm, mean gradient 68mm, EF 45%  . CARDIAC VALVE REPLACEMENT     AVR 09-29-12  . CHOLECYSTECTOMY    . FRACTURE SURGERY    . LEFT AND RIGHT HEART CATHETERIZATION WITH CORONARY ANGIOGRAM N/A 09/18/2012   Procedure: LEFT AND RIGHT HEART CATHETERIZATION WITH CORONARY ANGIOGRAM;  Surgeon: Troy Sine, MD;  Location: [facility] CATH LAB;  Service: Cardiovascular;  Laterality: N/A;    Current Outpatient Medications  Medication Sig Dispense Refill  . acetaminophen (TYLENOL) 325 MG tablet Take 2 tablets (650 mg total) by mouth every 4 (four) hours as needed for pain or fever.    [name] albuterol (PROVENTIL HFA;VENTOLIN HFA) 108 (  90 BASE) MCG/ACT inhaler Inhale 2 puffs into the lungs every 6 (six) hours as needed. For shortness of breath    . Ascorbic Acid (VITAMIN C) 1000 MG tablet Take 1,000 mg by mouth daily.    . B-D ULTRAFINE III SHORT PEN 31G X 8 MM MISC     . betamethasone valerate (VALISONE) 0.1 % cream Apply 1 application topically daily as needed.     . cholecalciferol (VITAMIN D) 1000 UNITS tablet Take 1,000 Units by mouth daily.    . Chromium 200 MCG CAPS Take 200 mcg by mouth daily.    . clotrimazole-betamethasone (LOTRISONE) cream Apply 1 application topically as needed.    . digoxin (LANOXIN) 0.125 MG tablet Take 1 tablet (125 mcg total) by mouth  daily. 90 tablet 1  . EASY TOUCH LANCETS 30G/TWIST MISC     . empagliflozin (JARDIANCE) 25 MG TABS tablet Take 25 mg by mouth daily. 90 tablet 1  . escitalopram (LEXAPRO) 10 MG tablet Take 10 mg by mouth every evening.     . ezetimibe (ZETIA) 10 MG tablet Take 1 tablet (10 mg total) by mouth daily. 90 tablet 0  . fenofibrate (TRICOR) 145 MG tablet Take 1 tablet (145 mg total) by mouth daily. 90 tablet 0  . Fluticasone-Salmeterol (ADVAIR) 250-50 MCG/DOSE AEPB Inhale 1 puff into the lungs daily. For asthma related symptoms    . furosemide (LASIX) 40 MG tablet Take 1 tablet (40 mg total) by mouth daily. 90 tablet 3  . glimepiride (AMARYL) 2 MG tablet Take 2 mg by mouth daily with breakfast.     . Glucosamine-Chondroit-Vit C-Mn (GLUCOSAMINE 1500 COMPLEX PO) Take 1,500 mg by mouth daily.    Joseph Hoffman (VASCEPA) 1 g CAPS Take 2 capsules (2 g total) by mouth 2 (two) times daily. 360 capsule 3  . insulin glargine (LANTUS) 100 UNIT/ML injection Inject 5-20 Units into the skin 2 (two) times daily. 5-20 units at breakfast and dinner depending on blood sugar levels    . Liraglutide (VICTOZA Jansen) Inject 1.8 mg into the skin 1 day or 1 dose.    . metFORMIN (GLUCOPHAGE-XR) 500 MG 24 hr tablet Take 2,000 mg by mouth at bedtime.    . metoprolol succinate (TOPROL XL) 25 MG 24 hr tablet Take 1 tablet (25 mg total) by mouth daily. 90 tablet 2  . Multiple Vitamins-Minerals (CENTRUM SPECIALIST HEART PO) Take 1 tablet by mouth 2 (two) times daily.    [name] warfarin (COUMADIN) 5 MG tablet Take as instructed with 10 mg. 90 tablet 1   No current facility-administered medications for this visit.     Allergies:   Iohexol; Niacin and related; and Penicillins    Social History:  The patient  reports that he quit smoking about 36 years ago. His smoking use included cigarettes. He has a 2.50 pack-year smoking history. He has quit using smokeless tobacco. He reports current alcohol use. He reports that he does not use  drugs.   Family History:  The patient's family history includes Diabetes in his father; Heart attack (age of onset: 77) in his brother; Hyperlipidemia (age of onset: 16) in his brother; Hypertension in his mother.  He indicated that his mother is alive. He indicated that his father is alive. He indicated that his sister is alive. He indicated that one of his two brothers is deceased.   ROS:  Please see the history of present illness. All other systems are reviewed and negative.    PHYSICAL EXAM:  VS:  BP 118/76   Pulse (!) 55   Ht 5\' 11"  (1.803 m)   Wt 263 lb 4 oz (119.4 kg)   SpO2 96%   BMI 36.72 kg/m  , BMI Body mass index is 36.72 kg/m. GEN: Well nourished, well developed, male in no acute distress HEENT: normal for age  Neck: minimal JVD, no carotid bruit, no masses Cardiac: Irregular rate and rhythm; 2/6 diastolic murmur, no rubs, or gallops Respiratory:  clear to auscultation bilaterally, normal work of breathing GI: soft, nontender, nondistended, + BS MS: no deformity or atrophy; trace lower extremity edema; distal pulses are 2+ in all 4 extremities  Skin: warm and dry, no rash Neuro:  Strength and sensation are intact Psych: euthymic mood, full affect   EKG:  EKG is ordered today. The ekg ordered today demonstrates Atrial fib, HR 72, PVCs noted, no acute ischemic changes  ECHO: 04/03/2018 - Left ventricle: The cavity size was mildly dilated. Wall   thickness was increased in a pattern of mild LVH. Systolic   function was normal. The estimated ejection fraction was in the   range of 50% to 55%. Wall motion was normal; there were no   regional wall motion abnormalities. - Aortic valve: A mechanical prosthesis was present. There was mild   regurgitation. - Mitral valve: Calcified annulus. There was mild regurgitation. - Left atrium: The atrium was severely dilated. - Right ventricle: The cavity size was mildly dilated. - Right atrium: The atrium was moderately  dilated.  Impressions: - Normal LV systolic function; mild LVH; mild LVE; s/p AVR with   elevated mean gradient (26 mmHg) and mild AI; mild MR; biatrial   enlargement; mild RVE.  CATH: 2013 ANGIOGRAPHIC DATA:  The left main essentially was a common ostium which immediately bifurcated into the LAD and circumflex system.  The LAD had mild luminal irregularity but was without significant stenoses and gave rise to 3 proximal septal perforating arteries and 2 major diagonal vessels and extended to the apex.  The circumflex vessel was free of significant disease.  It gave rise to a major bifurcating marginal branch, which extended to the apex.  Selective angiography in the right coronary artery revealed a fairly normal right coronary artery, which supplied the PDA and posterolateral vessel.  IMPRESSION: 1. Severe calcific aortic stenosis with a peak to peak gradient of 65     mm, mean gradient of 50 mm, and calculated aortic valve area at 0.9-     1.0 square cm. 2. Mild left ventricular dysfunction with an ejection fraction of 45%. 3. No significant coronary obstructive disease.  RECOMMENDATION:  Surgical consultation for consideration of aortic valve replacement surgery.   Recent Labs: 04/03/2018: Hemoglobin 13.8; NT-Pro BNP 503; Platelets 168; TSH 1.280 11/21/2018: ALT 46; BUN 37; Creatinine, Ser 1.52; Potassium 5.2; Sodium 140  CBC    Component Value Date/Time   WBC 7.2 04/03/2018 0809   WBC 9.5 08/24/2013 1023   RBC 5.03 04/03/2018 0809   RBC 4.71 08/24/2013 1023   HGB 13.8 04/03/2018 0809   HCT 42.4 04/03/2018 0809   PLT 168 04/03/2018 0809   MCV 84 04/03/2018 0809   MCH 27.4 04/03/2018 0809   MCH 28.0 08/24/2013 1023   MCHC 32.5 04/03/2018 0809   MCHC 34.6 08/24/2013 1023   RDW 15.5 (H) 04/03/2018 0809   LYMPHSABS 1.6 08/24/2013 1023   MONOABS 0.8 08/24/2013 1023   EOSABS 0.3 08/24/2013 1023   BASOSABS 0.0 08/24/2013 1023   CMP Latest  Ref Rng & Units  11/21/2018 09/22/2018 09/17/2018  Glucose 65 - 99 mg/dL 112(H) 186(H) 315(H)  BUN 8 - 27 mg/dL 37(H) 47(H) 48(H)  Creatinine 0.76 - 1.27 mg/dL 1.52(H) 1.57(H) 1.19  Sodium 134 - 144 mmol/L 140 139 136  Potassium 3.5 - 5.2 mmol/L 5.2 5.1 5.5(H)  Chloride 96 - 106 mmol/L 103 99 97  CO2 20 - 29 mmol/L 22 23 19(L)  Calcium 8.6 - 10.2 mg/dL 10.2 10.3(H) 9.8  Total Protein 6.0 - 8.5 g/dL 6.8 - 7.2  Total Bilirubin 0.0 - 1.2 mg/dL 0.6 - 0.6  Alkaline Phos 39 - 117 IU/L 43 - 64  AST 0 - 40 IU/L 43(H) - 27  ALT 0 - 44 IU/L 46(H) - 32     Lipid Panel Lab Results  Component Value Date   CHOL 155 11/21/2018   HDL 25 (L) 11/21/2018   LDLCALC 79 11/21/2018   TRIG 253 (H) 11/21/2018   CHOLHDL 6.2 (H) 11/21/2018      Wt Readings from Last 3 Encounters:  01/06/19 263 lb 4 oz (119.4 kg)  12/11/18 267 lb 12.8 oz (121.5 kg)  09/22/18 264 lb 12.8 oz (120.1 kg)     Other studies Reviewed: Additional studies/ records that were reviewed today include: office notes, hospital records and testing.   ASSESSMENT AND PLAN:  1.  Acute on chronic systolic CHF/increased dyspnea on exertion: He describes volume overload, but his weight has not increased significantly.  He is working on dietary and sodium compliance, feels like he does a pretty good job. -Explained that we did not have as many options because his creatinine was above baseline. - He admits to fluid indiscretion, he is asked to limit fluid intake to less than 1.5 L daily.  He is asked to limit sodium to less than 2000 mg daily. -He is asked to do daily weights. -I will recheck an echocardiogram to see if his PAS is elevated or his valve might be contributing to his symptoms. - No med changes for now, follow-up in a week and see how this is going.  Check a BMET at that time -No Avapro with his elevated creatinine.   2.  Chronic atrial fibrillation: His heart rate is generally controlled, continue Toprol-XL 25 mg a day  3.  Chronic  anticoagulation: His INR was 1.8 on labs last week by his PCP who manages his Coumadin. -Follow-up as scheduled.  He understands to limit vitamin K rich foods  4.  Saint Jude mechanical AVR: See previous echo report above.  At that time, his valvular regurgitation was mild.  His murmur sounds loud enough that I am concerned that the regurgitation has worsened. -Recheck an echo   Current medicines are reviewed at length with the patient today.  The patient has concerns regarding medicines.  Concerns were addressed  The following changes have been made: None  Labs/ tests ordered today include:   Orders Placed This Encounter  Procedures  . Basic metabolic panel  . EKG 12-Lead  . ECHOCARDIOGRAM COMPLETE     Disposition:   FU with Shelva Majestic, MD  Signed, Rosaria Ferries, PA-C  01/06/2019 5:39 PM    [facility] Phone: [phone]; Fax: [phone]

## 2019-01-06 NOTE — Telephone Encounter (Signed)
New Message   Pt c/o Shortness Of Breath: STAT if SOB developed within the last 24 hours or pt is noticeably SOB on the phone  1. Are you currently SOB (can you hear that pt is SOB on the phone)? Yes  2. How long have you been experiencing SOB? At least a week  3. Are you SOB when sitting or when up moving around? Both all the time, sometimes it goes away for a little bit but then comes right back.  4. Are you currently experiencing any other symptoms? Muscle cramps in legs at night   Patient states internalist Dr. Doyle Askew stated his Kidney function has gotten worse and faxed the lab results to the office.

## 2019-01-06 NOTE — Patient Instructions (Addendum)
Medication Instructions:  Your physician recommends that you continue on your current medications as directed. Please refer to the Current Medication list given to you today.  If you need a refill on your cardiac medications before your next appointment, please call your pharmacy.   Lab work: Art gallery manager same day as your echocardiogram If you have labs (blood work) drawn today and your tests are completely normal, you will receive your results only by: [name] MyChart Message (if you have MyChart) OR . A paper copy in the mail If you have any lab test that is abnormal or we need to change your treatment, we will call you to review the results.  Testing/Procedures: Your physician has requested that you have an echocardiogram. Echocardiography is a painless test that uses sound waves to create images of your heart. It provides your doctor with information about the size and shape of your heart and how well your heart's chambers and valves are working. This procedure takes approximately one hour. There are no restrictions for this procedure. (To be scheduled this week or Monday 01/12/19)  Follow-Up: At [facility], you and your health needs are our priority.  As part of our continuing mission to provide you with exceptional heart care, we have created designated Provider Care Teams.  These Care Teams include your primary Cardiologist (physician) and Advanced Practice Providers (APPs -  Physician Assistants and Nurse Practitioners) who all work together to provide you with the care you need, when you need it. . You will need a follow up appointment with Rosaria Ferries, PA on 01/14/19  Any Other Special Instructions Will Be Listed Below (If Applicable). Limit your sodium intake to 2 grams (2,000mg ) daily  Limit your fluid intake to 1.5L daily  Keep a log of your blood sugars         Two Gram Sodium Diet 2000 mg  What is Sodium? Sodium is a mineral found naturally in many foods. The most  significant source of sodium in the diet is table salt, which is about 40% sodium.  Processed, convenience, and preserved foods also contain a large amount of sodium.  The body needs only 500 mg of sodium daily to function,  A normal diet provides more than enough sodium even if you do not use salt.  Why Limit Sodium? A build up of sodium in the body can cause thirst, increased blood pressure, shortness of breath, and water retention.  Decreasing sodium in the diet can reduce edema and risk of heart attack or stroke associated with high blood pressure.  Keep in mind that there are many other factors involved in these health problems.  Heredity, obesity, lack of exercise, cigarette smoking, stress and what you eat all play a role.  General Guidelines:  Do not add salt at the table or in cooking.  One teaspoon of salt contains over 2 grams of sodium.  Read food labels  Avoid processed and convenience foods  Ask your dietitian before eating any foods not dicussed in the menu planning guidelines  Consult your physician if you wish to use a salt substitute or a sodium containing medication such as antacids.  Limit milk and milk products to 16 oz (2 cups) per day.  Shopping Hints:  READ LABELS!! "Dietetic" does not necessarily mean low sodium.  Salt and other sodium ingredients are often added to foods during processing.   Menu Planning Guidelines Food Group Choose More Often Avoid  Beverages (see also the milk group All fruit juices, low-sodium,  salt-free vegetables juices, low-sodium carbonated beverages Regular vegetable or tomato juices, commercially softened water used for drinking or cooking  Breads and Cereals Enriched white, wheat, rye and pumpernickel bread, hard rolls and dinner rolls; muffins, cornbread and waffles; most dry cereals, cooked cereal without added salt; unsalted crackers and breadsticks; low sodium or homemade bread crumbs Bread, rolls and crackers with salted tops; quick  breads; instant hot cereals; pancakes; commercial bread stuffing; self-rising flower and biscuit mixes; regular bread crumbs or cracker crumbs  Desserts and Sweets Desserts and sweets mad with mild should be within allowance Instant pudding mixes and cake mixes  Fats Butter or margarine; vegetable oils; unsalted salad dressings, regular salad dressings limited to 1 Tbs; light, sour and heavy cream Regular salad dressings containing bacon fat, bacon bits, and salt pork; snack dips made with instant soup mixes or processed cheese; salted nuts  Fruits Most fresh, frozen and canned fruits Fruits processed with salt or sodium-containing ingredient (some dried fruits are processed with sodium sulfites        Vegetables Fresh, frozen vegetables and low- sodium canned vegetables Regular canned vegetables, sauerkraut, pickled vegetables, and others prepared in brine; frozen vegetables in sauces; vegetables seasoned with ham, bacon or salt pork  Condiments, Sauces, Miscellaneous  Salt substitute with physician's approval; pepper, herbs, spices; vinegar, lemon or lime juice; hot pepper sauce; garlic powder, onion powder, low sodium soy sauce (1 Tbs.); low sodium condiments (ketchup, chili sauce, mustard) in limited amounts (1 tsp.) fresh ground horseradish; unsalted tortilla chips, pretzels, potato chips, popcorn, salsa (1/4 cup) Any seasoning made with salt including garlic salt, celery salt, onion salt, and seasoned salt; sea salt, rock salt, kosher salt; meat tenderizers; monosodium glutamate; mustard, regular soy sauce, barbecue, sauce, chili sauce, teriyaki sauce, steak sauce, Worcestershire sauce, and most flavored vinegars; canned gravy and mixes; regular condiments; salted snack foods, olives, picles, relish, horseradish sauce, catsup   Food preparation: Try these seasonings Meats:    Pork Sage, onion Serve with applesauce  Chicken Poultry seasoning, thyme, parsley Serve with cranberry sauce  Lamb  Curry powder, rosemary, garlic, thyme Serve with mint sauce or jelly  Veal Marjoram, basil Serve with current jelly, cranberry sauce  Beef Pepper, bay leaf Serve with dry mustard, unsalted chive butter  Fish [place] leaf, dill Serve with unsalted lemon butter, unsalted parsley butter  Vegetables:    Asparagus Lemon juice   Broccoli Lemon juice   Carrots Mustard dressing parsley, mint, nutmeg, glazed with unsalted butter and sugar   Green beans Marjoram, lemon juice, nutmeg,dill seed   Tomatoes Basil, marjoram, onion   Spice /blend for [employer]" 4 tsp ground thyme 1 tsp ground sage 3 tsp ground rosemary 4 tsp ground marjoram   Test your knowledge 1. A product that says "Salt Free" may still contain sodium. True or False 2. Garlic Powder and Hot Pepper Sauce an be used as alternative seasonings.True or False 3. Processed foods have more sodium than fresh foods.  True or False 4. Canned Vegetables have less sodium than froze True or False  WAYS TO DECREASE YOUR SODIUM INTAKE 1. Avoid the use of added salt in cooking and at the table.  Table salt (and other prepared seasonings which contain salt) is probably one of the greatest sources of sodium in the diet.  Unsalted foods can gain flavor from the sweet, sour, and butter taste sensations of herbs and spices.  Instead of using salt for seasoning, try the following seasonings with the foods listed.  Remember: how you use them to enhance natural food flavors is limited only by your creativity... Allspice-Meat, fish, eggs, fruit, peas, red and yellow vegetables Almond Extract-Fruit baked goods Anise Seed-Sweet breads, fruit, carrots, beets, cottage cheese, cookies (tastes like licorice) Basil-Meat, fish, eggs, vegetables, rice, vegetables salads, soups, sauces Bay Leaf-Meat, fish, stews, poultry Burnet-Salad, vegetables (cucumber-like flavor) Caraway Seed-Bread, cookies, cottage cheese, meat, vegetables, cheese, rice Cardamon-Baked goods,  fruit, soups Celery Powder or seed-Salads, salad dressings, sauces, meatloaf, soup, bread.Do not use  celery salt Chervil-Meats, salads, fish, eggs, vegetables, cottage cheese (parsley-like flavor) Chili Power-Meatloaf, chicken cheese, corn, eggplant, egg dishes Chives-Salads cottage cheese, egg dishes, soups, vegetables, sauces Cilantro-Salsa, casseroles Cinnamon-Baked goods, fruit, pork, lamb, chicken, carrots Cloves-Fruit, baked goods, fish, pot roast, green beans, beets, carrots Coriander-Pastry, cookies, meat, salads, cheese (lemon-orange flavor) Cumin-Meatloaf, fish,cheese, eggs, cabbage,fruit pie (caraway flavor) [employer], fruit, eggs, fish, poultry, cottage cheese, vegetables Dill Seed-Meat, cottage cheese, poultry, vegetables, fish, salads, bread Fennel Seed-Bread, cookies, apples, pork, eggs, fish, beets, cabbage, cheese, Licorice-like flavor Garlic-(buds or powder) Salads, meat, poultry, fish, bread, butter, vegetables, potatoes.Do not  use garlic salt Ginger-Fruit, vegetables, baked goods, meat, fish, poultry Horseradish Root-Meet, vegetables, butter Lemon Juice or Extract-Vegetables, fruit, tea, baked goods, fish salads Mace-Baked goods fruit, vegetables, fish, poultry (taste like nutmeg) Maple Extract-Syrups Marjoram-Meat, chicken, fish, vegetables, breads, green salads (taste like Sage) Mint-Tea, lamb, sherbet, vegetables, desserts, carrots, cabbage Mustard, Dry or Seed-Cheese, eggs, meats, vegetables, poultry Nutmeg-Baked goods, fruit, chicken, eggs, vegetables, desserts Onion Powder-Meat, fish, poultry, vegetables, cheese, eggs, bread, rice salads (Do not use   Onion salt) Orange Extract-Desserts, baked goods Oregano-Pasta, eggs, cheese, onions, pork, lamb, fish, chicken, vegetables, green salads Paprika-Meat, fish, poultry, eggs, cheese, vegetables Parsley Flakes-Butter, vegetables, meat fish, poultry, eggs, bread, salads (certain forms may   Contain  sodium Pepper-Meat fish, poultry, vegetables, eggs Peppermint Extract-Desserts, baked goods Poppy Seed-Eggs, bread, cheese, fruit dressings, baked goods, noodles, vegetables, cottage  [employer], poultry, meat, fish, cauliflower, turnips,eggs bread Saffron-Rice, bread, veal, chicken, fish, eggs Sage-Meat, fish, poultry, onions, eggplant, tomateos, pork, stews Savory-Eggs, salads, poultry, meat, rice, vegetables, soups, pork Tarragon-Meat, poultry, fish, eggs, butter, vegetables (licorice-like flavor)  Thyme-Meat, poultry, fish, eggs, vegetables, (clover-like flavor), sauces, soups Tumeric-Salads, butter, eggs, fish, rice, vegetables (saffron-like flavor) Vanilla Extract-Baked goods, candy Vinegar-Salads, vegetables, meat marinades Walnut Extract-baked goods, candy  2. Choose your Foods Wisely   The following is a list of foods to avoid which are high in sodium:  Meats-Avoid all smoked, canned, salt cured, dried and kosher meat and fish as well as Anchovies   [employer] meats:Bologna, Liverwurst, Pastrami Canned meat or fish  Marinated herring Caviar    Pepperoni Corned Beef   Pizza Dried chipped beef  Salami Frozen breaded fish or meat Salt pork Frankfurters or hot dogs  Sardines Gefilte fish   Sausage Ham (boiled ham, Proscuitto Smoked butt    spiced ham)   Spam      TV Dinners Vegetables Canned vegetables (Regular) Relish Canned mushrooms  Sauerkraut Olives    Tomato juice Pickles  Bakery and Dessert Products Canned puddings  Cream pies Cheesecake   Decorated cakes Cookies  Beverages/Juices Tomato juice, regular  Gatorade   V-8 vegetable juice, regular  Breads and Cereals Biscuit mixes   Salted potato chips, corn chips, pretzels Bread stuffing mixes  Salted crackers and rolls Pancake and waffle mixes Self-rising flour  Seasonings Accent    Meat sauces Barbecue sauce  Meat tenderizer Catsup  Monosodium  glutamate (MSG) Celery salt   Onion salt Chili sauce   Prepared mustard Garlic salt   Salt, seasoned salt, sea salt Gravy mixes   Soy sauce Horseradish   Steak sauce Ketchup   Tartar sauce Lite salt    Teriyaki sauce Marinade mixes   Worcestershire sauce  Others Baking powder   Cocoa and cocoa mixes Baking soda   Commercial casserole mixes Candy-caramels, chocolate  Dehydrated soups    Bars, fudge,nougats  Instant rice and pasta mixes Canned broth or soup  Maraschino cherries Cheese, aged and processed cheese and cheese spreads  Learning Assessment Quiz  Indicated T (for True) or F (for False) for each of the following statements:  1. _____ Fresh fruits and vegetables and unprocessed grains are generally low in sodium 2. _____ Water may contain a considerable amount of sodium, depending on the source 3. _____ You can always tell if a food is high in sodium by tasting it 4. _____ Certain laxatives my be high in sodium and should be avoided unless prescribed   by a physician or pharmacist 5. _____ Salt substitutes may be used freely by anyone on a sodium restricted diet 6. _____ Sodium is present in table salt, food additives and as a natural component of   most foods 7. _____ Table salt is approximately 90% sodium 8. _____ Limiting sodium intake may help prevent excess fluid accumulation in the body 9. _____ On a sodium-restricted diet, seasonings such as bouillon soy sauce, and    cooking wine should be used in place of table salt 10. _____ On an ingredient list, a product which lists monosodium glutamate as the first   ingredient is an appropriate food to include on a low sodium diet  Circle the best answer(s) to the following statements (Hint: there may be more than one correct answer)  11. On a low-sodium diet, some acceptable snack items are:    A. Olives  F. Bean dip   K. Grapefruit juice    B. Salted Pretzels G. Commercial Popcorn   L. Canned peaches    C. Carrot  Sticks  H. Bouillon   M. Unsalted nuts   D. [place] fries  I. Peanut butter crackers N. Salami   E. Sweet pickles J. Tomato Juice   O. Pizza  12.  Seasonings that may be used freely on a reduced - sodium diet include   A. Lemon wedges F.Monosodium glutamate K. Celery seed    B.Soysauce   G. Pepper   L. Mustard powder   C. Sea salt  H. Cooking wine  M. Onion flakes   D. Vinegar  E. Prepared horseradish N. Salsa   E. Sage   J. Worcestershire sauce  O. Chutney

## 2019-01-06 NOTE — Telephone Encounter (Signed)
Patient saw PCP and was recommended he follow up with cardiologist. Scheduled appointment with Donnie Aho PA today. Labs printed from [facility]

## 2019-01-08 ENCOUNTER — Ambulatory Visit (HOSPITAL_COMMUNITY): Payer: Medicare Other | Attending: Cardiovascular Disease

## 2019-01-08 ENCOUNTER — Other Ambulatory Visit: Payer: Medicare Other

## 2019-01-08 DIAGNOSIS — I5023 Acute on chronic systolic (congestive) heart failure: Secondary | ICD-10-CM | POA: Diagnosis not present

## 2019-01-08 DIAGNOSIS — Z952 Presence of prosthetic heart valve: Secondary | ICD-10-CM | POA: Insufficient documentation

## 2019-01-08 MED ORDER — PERFLUTREN LIPID MICROSPHERE
1.0000 mL | INTRAVENOUS | Status: AC | PRN
  Administered 2019-01-08: 2 mL via INTRAVENOUS

## 2019-01-09 LAB — BASIC METABOLIC PANEL
BUN/Creatinine Ratio: 20 (ref 10–24)
BUN: 35 mg/dL — ABNORMAL HIGH (ref 8–27)
CALCIUM: 10.3 mg/dL — AB (ref 8.6–10.2)
CO2: 24 mmol/L (ref 20–29)
Chloride: 100 mmol/L (ref 96–106)
Creatinine, Ser: 1.72 mg/dL — ABNORMAL HIGH (ref 0.76–1.27)
GFR calc Af Amer: 46 mL/min/{1.73_m2} — ABNORMAL LOW (ref >59)
GFR calc non Af Amer: 40 mL/min/{1.73_m2} — ABNORMAL LOW (ref >59)
Glucose: 191 mg/dL — ABNORMAL HIGH (ref 65–99)
POTASSIUM: 4.9 mmol/L (ref 3.5–5.2)
Sodium: 140 mmol/L (ref 134–144)

## 2019-01-13 ENCOUNTER — Ambulatory Visit (INDEPENDENT_AMBULATORY_CARE_PROVIDER_SITE_OTHER): Payer: Medicare Other | Admitting: Physician Assistant

## 2019-01-13 ENCOUNTER — Encounter: Payer: Self-pay | Admitting: Physician Assistant

## 2019-01-13 VITALS — BP 136/74 | HR 78 | Ht 71.0 in | Wt 259.0 lb

## 2019-01-13 DIAGNOSIS — I4891 Unspecified atrial fibrillation: Secondary | ICD-10-CM | POA: Diagnosis not present

## 2019-01-13 DIAGNOSIS — N183 Chronic kidney disease, stage 3 unspecified: Secondary | ICD-10-CM

## 2019-01-13 DIAGNOSIS — I5032 Chronic diastolic (congestive) heart failure: Secondary | ICD-10-CM | POA: Diagnosis not present

## 2019-01-13 DIAGNOSIS — Z952 Presence of prosthetic heart valve: Secondary | ICD-10-CM | POA: Diagnosis not present

## 2019-01-13 DIAGNOSIS — Z7901 Long term (current) use of anticoagulants: Secondary | ICD-10-CM | POA: Diagnosis not present

## 2019-01-13 DIAGNOSIS — I342 Nonrheumatic mitral (valve) stenosis: Secondary | ICD-10-CM | POA: Diagnosis not present

## 2019-01-13 NOTE — Patient Instructions (Signed)
Medication Instructions:  Your physician recommends that you continue on your current medications as directed. Please refer to the Current Medication list given to you today.  If you need a refill on your cardiac medications before your next appointment, please call your pharmacy.    Testing/Procedures: We will let you know if Dr. Claiborne Billings recommends any testing.  Follow-Up: At [facility], you and your health needs are our priority.  As part of our continuing mission to provide you with exceptional heart care, we have created designated Provider Care Teams.  These Care Teams include your primary Cardiologist (physician) and Advanced Practice Providers (APPs -  Physician Assistants and Nurse Practitioners) who all work together to provide you with the care you need, when you need it. [name] Keep your scheduled appointment with Dr. Claiborne Billings.  Any Other Special Instructions Will Be Listed Below (If Applicable). CONTINUE DAILY WEIGHTS-YOU MAY TAKE ADDITIONAL LASIX, IF YOUR WEIGHT IS >3 POUNDS DAILY OR >5 POUNDS IN ONE WEEK. PLEASE CALL OUR OFFICE TO LET us KNOW. [phone].  Continue to limit sodium intake to 2,000 mg daily and limit liquid intake to 1.5 Liters/quarts daily.

## 2019-01-13 NOTE — Progress Notes (Signed)
Cardiology Office Note   Date:  01/13/2019   ID:  Olander, Friedl [birth year], MRN [id number]  PCP:  Orpah Melter, MD Cardiologist:  Shelva Majestic, MD 12/11/2018 Rosaria Ferries, PA-C 01/06/2019  No chief complaint on file.   History of Present Illness: Joseph Hoffman is a 68 y.o. male with a history of perm Afib, DM, HTN, St Jude mech AVR 2013, S-CHF, OSA on CPAP  02/04 office visit, pt problems w/ daily wts, will fix, volume overload by exam, diet compliance reinforced, recheck echo, ck BMET at f/u, may need increased diuretics, held off due to worse renal function  [name] presents for cardiology follow up.   He is still having problems w/ DOE, has to stop going up the stairs.  He has been diet compliant, feels more able to manage this.   For the first time, last night he slept all night, no cramps, not waking up due to SOB.  He did get up to urinate twice, but went right back to sleep.  He did not have to get up to get into the recliner.  He is not waking with lower extremity edema.  He has been tracking his weight, the weight has been up and down, but is down a couple of pounds today.  He has not had chest pain.   Past Medical History:  Diagnosis Date  . Anxiety   . Aortic stenosis   . Arthritis   . Asthma   . Asymptomatic LV dysfunction, EF 45%, normal coronary arteries on cardiac cath 09/18/12 09/30/2012  . CHF (congestive heart failure) ([place])   . Chronic anticoagulation, on Xarelto prior to admit 09/30/2012  . DM (diabetes mellitus) ([place]) 09/30/2012  . Hepatitis 2003  . Hypertension   . Myocardial infarction ([place])   . Permanent atrial fibrillation, since 1994 09/30/2012   stress test 02/08/12- normal study, no significant ischemia  . S/P AVR (aortic valve replacement), 09/30/2012   a. s/p mechcanical AVR in 09/2012 (on Coumadin)  . Sleep apnea    uses CPAP    Past Surgical History:  Procedure Laterality Date  . ABDOMINAL ANGIOGRAM  09/18/2012     Procedure: ABDOMINAL ANGIOGRAM;  Surgeon: Troy Sine, MD;  Location: [facility] CATH LAB;  Service: Cardiovascular;;  . AORTIC VALVE REPLACEMENT  09/29/2012   Procedure: AORTIC VALVE REPLACEMENT (AVR);  Surgeon: Gaye Pollack, MD;  Location: [place];  Service: Open Heart Surgery;  Laterality: N/A;  . ARCH AORTOGRAM  09/18/2012   Procedure: ARCH AORTOGRAM;  Surgeon: Troy Sine, MD;  Location: [facility] CATH LAB;  Service: Cardiovascular;;  . CARDIAC CATHETERIZATION  09/18/12   severe calcific aortic stenosis, peak gradient 12mm, mean gradient 59mm, EF 45%  . CARDIAC VALVE REPLACEMENT     AVR 09-29-12  . CHOLECYSTECTOMY    . FRACTURE SURGERY    . LEFT AND RIGHT HEART CATHETERIZATION WITH CORONARY ANGIOGRAM N/A 09/18/2012   Procedure: LEFT AND RIGHT HEART CATHETERIZATION WITH CORONARY ANGIOGRAM;  Surgeon: Troy Sine, MD;  Location: [facility] CATH LAB;  Service: Cardiovascular;  Laterality: N/A;    Current Outpatient Medications  Medication Sig Dispense Refill  . acetaminophen (TYLENOL) 325 MG tablet Take 2 tablets (650 mg total) by mouth every 4 (four) hours as needed for pain or fever.    [name] albuterol (PROVENTIL HFA;VENTOLIN HFA) 108 (90 BASE) MCG/ACT inhaler Inhale 2 puffs into the lungs every 6 (six) hours as needed. For shortness of breath    .  Ascorbic Acid (VITAMIN C) 1000 MG tablet Take 1,000 mg by mouth daily.    . B-D ULTRAFINE III SHORT PEN 31G X 8 MM MISC     . betamethasone valerate (VALISONE) 0.1 % cream Apply 1 application topically daily as needed.     . cholecalciferol (VITAMIN D) 1000 UNITS tablet Take 1,000 Units by mouth daily.    . Chromium 200 MCG CAPS Take 200 mcg by mouth daily.    . clotrimazole-betamethasone (LOTRISONE) cream Apply 1 application topically as needed.    . digoxin (LANOXIN) 0.125 MG tablet Take 1 tablet (125 mcg total) by mouth daily. 90 tablet 1  . EASY TOUCH LANCETS 30G/TWIST MISC     . empagliflozin (JARDIANCE) 25 MG TABS tablet Take 25 mg by mouth daily. 90  tablet 1  . escitalopram (LEXAPRO) 10 MG tablet Take 10 mg by mouth every evening.     . ezetimibe (ZETIA) 10 MG tablet Take 1 tablet (10 mg total) by mouth daily. 90 tablet 0  . fenofibrate (TRICOR) 145 MG tablet Take 1 tablet (145 mg total) by mouth daily. 90 tablet 0  . Fluticasone-Salmeterol (ADVAIR) 250-50 MCG/DOSE AEPB Inhale 1 puff into the lungs daily. For asthma related symptoms    . furosemide (LASIX) 40 MG tablet Take 1 tablet (40 mg total) by mouth daily. 90 tablet 3  . glimepiride (AMARYL) 2 MG tablet Take 2 mg by mouth daily with breakfast.     . Glucosamine-Chondroit-Vit C-Mn (GLUCOSAMINE 1500 COMPLEX PO) Take 1,500 mg by mouth daily.    Vanessa Kick Ethyl (VASCEPA) 1 g CAPS Take 2 capsules (2 g total) by mouth 2 (two) times daily. 360 capsule 3  . insulin glargine (LANTUS) 100 UNIT/ML injection Inject 5-20 Units into the skin 2 (two) times daily. 5-20 units at breakfast and dinner depending on blood sugar levels    . Liraglutide (VICTOZA [place]) Inject 1.8 mg into the skin 1 day or 1 dose.    . metFORMIN (GLUCOPHAGE-XR) 500 MG 24 hr tablet Take 2,000 mg by mouth at bedtime.    . metoprolol succinate (TOPROL XL) 25 MG 24 hr tablet Take 1 tablet (25 mg total) by mouth daily. 90 tablet 2  . Multiple Vitamins-Minerals (CENTRUM SPECIALIST HEART PO) Take 1 tablet by mouth 2 (two) times daily.    [name] warfarin (COUMADIN) 5 MG tablet Take as instructed with 10 mg. 90 tablet 1   No current facility-administered medications for this visit.     Allergies:   Iohexol; Niacin and related; and Penicillins    Social History:  The patient  reports that he quit smoking about 36 years ago. His smoking use included cigarettes. He has a 2.50 pack-year smoking history. He has quit using smokeless tobacco. He reports current alcohol use. He reports that he does not use drugs.   Family History:  The patient's family history includes Diabetes in his father; Heart attack (age of onset: 27) in his brother;  Hyperlipidemia (age of onset: 58) in his brother; Hypertension in his mother.  He indicated that his mother is alive. He indicated that his father is alive. He indicated that his sister is alive. He indicated that one of his two brothers is deceased.   ROS:  Please see the history of present illness. All other systems are reviewed and negative.    PHYSICAL EXAM: VS:  BP 136/74   Pulse 78   Ht 5\' 11"  (1.803 m)   Wt 259 lb (117.5 kg)  SpO2 95%   BMI 36.12 kg/m  , BMI Body mass index is 36.12 kg/m. GEN: Well nourished, well developed, male in no acute distress HEENT: normal for age  Neck: Minimal JVD, no carotid bruit, no masses Cardiac: Irregular rate and rhythm; crisp valve click, soft systolic murmur and 2/6 diastolic murmur, no rubs, or gallops Respiratory:  clear to auscultation bilaterally, normal work of breathing GI: soft, nontender, nondistended, + BS MS: no deformity or atrophy; no edema; distal pulses are 2+ in all 4 extremities  Skin: warm and dry, no rash Neuro:  Strength and sensation are intact Psych: euthymic mood, full affect   EKG:  EKG is not ordered today.  ECHO: 01/08/2019  1. The left ventricle has mild-moderately reduced systolic function of 63-89%. The cavity size was moderately increased. There is no increased left ventricular wall thickness. Echo evidence of pseudonormalization in diastolic relaxation Elevated left  ventricular end-diastolic pressure Left ventrical global hypokinesis without regional wall motion abnormalities.  2. Left atrial size was severely dilated.  3. Right atrial size was severely dilated.  4. The mitral valve is normal in structure. There is moderate mitral annular calcification present. mild to moderately stenosed mitral valve stenosis.  5. The tricuspid valve is normal in structure.  6. A St. Jude mechanical is present. Aortic valve regurgitation is moderate by color flow Doppler.  7. Compared with the echo 04/2018, mechanical  aortic valve mean gradient has decreased from 26 mmHg to 16 mmHg, which is mildly elevated.  8. The pulmonic valve was normal in structure.  9. There is mild dilatation of the ascending aorta. 10. The inferior vena cava was dilated in size with <50% respiratory variability.  FINDINGS  Left Ventricle: The left ventricle has mild-moderately reduced systolic function of 37-34%. The cavity size was moderately increased. There is no increased left ventricular wall thickness. Echo evidence of pseudonormalization in diastolic relaxation  Elevated left ventricular end-diastolic pressure Left ventrical global hypokinesis without regional wall motion abnormalities. Definity contrast agent was given IV to delineate the left ventricular endocardial borders. Left Atrium: left atrial size was severely dilated Right Atrium: right atrial size was severely dilated Interatrial Septum: No atrial level shunt detected by color flow Doppler.  Pericardium: There is no evidence of pericardial effusion. Mitral Valve: The mitral valve is normal in structure. There is moderate mitral annular calcification present. Mitral valve regurgitation is mild by color flow Doppler. mild to moderately stenosed mitral valve stenosis. Restriction of the posterior  mitral valve leaflet is noted. Mitral stenosis is mild by PHT and moderate by mean gradient. Tricuspid Valve: The tricuspid valve is normal in structure. Tricuspid valve regurgitation was not visualized by color flow Doppler. Aortic Valve: A St. Jude mechanical bioprosthesis is present. Aortic valve regurgitation is moderate by color flow Doppler. Compared with the echo 04/2018, mechanical aortic valve mean gradient has decreased from 26 mmHg to 16 mmHg, which is mildly  elevated. Pulmonic Valve: The pulmonic valve was normal in structure. Pulmonic valve regurgitation is not visualized by color flow Doppler. Aorta: There is mild dilatation of the ascending aorta measuring 37  mm. Venous: The inferior vena cava is dilated in size with less than 50% respiratory variability.  04/03/2018 - Left ventricle: The cavity size was mildly dilated. Wall thickness was increased in a pattern of mild LVH. Systolic function was normal. The estimated ejection fraction was in the range of 50% to 55%. Wall motion was normal; there were no regional wall motion abnormalities. - Aortic  valve: A mechanical prosthesis was present. There was mild regurgitation. - Mitral valve: Calcified annulus. There was mild regurgitation. Mean gradient (D): 6 mm Hg. Peak gradient (D): 14 mm Hg. - Left atrium: The atrium was severely dilated. - Right ventricle: The cavity size was mildly dilated. - Right atrium: The atrium was moderately dilated.  Impressions: - Normal LV systolic function; mild LVH; mild LVE; s/p AVR with elevated mean gradient (26 mmHg) and mild AI; mild MR; biatrial enlargement; mild RVE.  CATH: 2013 ANGIOGRAPHIC DATA: The left main essentially was a common ostium which immediately bifurcated into the LAD and circumflex system. The LAD had mild luminal irregularity but was without significant stenoses and gave rise to 3 proximal septal perforating arteries and 2 major diagonal vessels and extended to the apex.  The circumflex vessel was free of significant disease. It gave rise to a major bifurcating marginal branch, which extended to the apex.  Selective angiography in the right coronary artery revealed a fairly normal right coronary artery, which supplied the PDA and posterolateral vessel.  IMPRESSION: 1. Severe calcific aortic stenosis with a peak to peak gradient of 65 mm, mean gradient of 50 mm, and calculated aortic valve area at 0.9- 1.0 square cm. 2. Mild left ventricular dysfunction with an ejection fraction of 45%. 3. No significant coronary obstructive disease.  RECOMMENDATION: Surgical consultation for consideration of  aortic valve replacement surgery.   Recent Labs: 04/03/2018: Hemoglobin 13.8; NT-Pro BNP 503; Platelets 168; TSH 1.280 11/21/2018: ALT 46 01/08/2019: BUN 35; Creatinine, Ser 1.72; Potassium 4.9; Sodium 140  CBC    Component Value Date/Time   WBC 7.2 04/03/2018 0809   WBC 9.5 08/24/2013 1023   RBC 5.03 04/03/2018 0809   RBC 4.71 08/24/2013 1023   HGB 13.8 04/03/2018 0809   HCT 42.4 04/03/2018 0809   PLT 168 04/03/2018 0809   MCV 84 04/03/2018 0809   MCH 27.4 04/03/2018 0809   MCH 28.0 08/24/2013 1023   MCHC 32.5 04/03/2018 0809   MCHC 34.6 08/24/2013 1023   RDW 15.5 (H) 04/03/2018 0809   LYMPHSABS 1.6 08/24/2013 1023   MONOABS 0.8 08/24/2013 1023   EOSABS 0.3 08/24/2013 1023   BASOSABS 0.0 08/24/2013 1023   CMP Latest Ref Rng & Units 01/08/2019 11/21/2018 09/22/2018  Glucose 65 - 99 mg/dL 191(H) 112(H) 186(H)  BUN 8 - 27 mg/dL 35(H) 37(H) 47(H)  Creatinine 0.76 - 1.27 mg/dL 1.72(H) 1.52(H) 1.57(H)  Sodium 134 - 144 mmol/L 140 140 139  Potassium 3.5 - 5.2 mmol/L 4.9 5.2 5.1  Chloride 96 - 106 mmol/L 100 103 99  CO2 20 - 29 mmol/L 24 22 23   Calcium 8.6 - 10.2 mg/dL 10.3(H) 10.2 10.3(H)  Total Protein 6.0 - 8.5 g/dL - 6.8 -  Total Bilirubin 0.0 - 1.2 mg/dL - 0.6 -  Alkaline Phos 39 - 117 IU/L - 43 -  AST 0 - 40 IU/L - 43(H) -  ALT 0 - 44 IU/L - 46(H) -     Lipid Panel Lab Results  Component Value Date   CHOL 155 11/21/2018   HDL 25 (L) 11/21/2018   LDLCALC 79 11/21/2018   TRIG 253 (H) 11/21/2018   CHOLHDL 6.2 (H) 11/21/2018      Wt Readings from Last 3 Encounters:  01/13/19 259 lb (117.5 kg)  01/06/19 263 lb 4 oz (119.4 kg)  12/11/18 267 lb 12.8 oz (121.5 kg)     Other studies Reviewed: Additional studies/ records that were reviewed today include: Office notes,  hospital records and testing.  ASSESSMENT AND PLAN:  1.  DOE, chronic diastolic CHF:  - His weight today is down a little, he feels that volume status is good - This weight will be his dry  weight. -Continue daily weights, low-sodium diet and 1.5 L fluid restriction. - Okay to take an extra Lasix tablet as needed for weight gain of 3 pounds in a day or 5 pounds in a week -Keep follow-up appointment with Dr. Claiborne Billings  2.  Mild-moderate mitral stenosis and history of Saint Jude mechanical aortic valve - Echo results are above.  Reviewed with Dr. Claiborne Billings.  His mitral valve is developing stenosis, but the gradients are not much changed from his last echo. -The gradient on his aortic valve actually decreased since his previous echo.  -I am concerned that the valvular abnormalities are contributing to his dyspnea on exertion - Call for any change in symptoms.  Otherwise, recheck in 1 year  3.  Chronic kidney disease, stage III - Because medications were not changed, I will not recheck a BMET today. -Follow-up as scheduled.  Current medicines are reviewed at length with the patient today.  The patient has concerns regarding medicines.  Concerns were addressed  The following changes have been made:  no change  Labs/ tests ordered today include:  No orders of the defined types were placed in this encounter.   Disposition:   FU with Shelva Majestic, MD  Signed, Rosaria Ferries, PA-C  01/13/2019 3:07 PM    [place] Phone: [phone]; Fax: [phone]

## 2019-01-20 ENCOUNTER — Telehealth: Payer: Self-pay | Admitting: Cardiovascular Disease

## 2019-01-20 DIAGNOSIS — I428 Other cardiomyopathies: Secondary | ICD-10-CM

## 2019-01-20 DIAGNOSIS — R0609 Other forms of dyspnea: Secondary | ICD-10-CM

## 2019-01-20 DIAGNOSIS — I350 Nonrheumatic aortic (valve) stenosis: Secondary | ICD-10-CM

## 2019-01-20 DIAGNOSIS — I5032 Chronic diastolic (congestive) heart failure: Secondary | ICD-10-CM

## 2019-01-20 NOTE — Telephone Encounter (Signed)
New Message         Patient's wife is calling today to check status on patient case. She states she hasn't hear anything in a week. Patient seen Barrett on 02/04 and 02/11 Pls call and advise.

## 2019-01-20 NOTE — Telephone Encounter (Signed)
SPOKE TO PATIENT'S WIFE -  AWARE OF INFORMATION - WIFE STATED PATIENT WANTED TO IF CAN RETURN  TO WORK- PATIENT WOULD HAVE TO DRIVE TO [place] EVERYDAY ( PATIENT DOES NOT THINK HE CAN DO IT IF IS SHORT OF BREATHE.  RN  ALSO INFORMED WIFE PATIENT MAY FEEL BETTER AFTER TAKING THE 2 DOSES  LASIX AS PRESCRIBED.--  WIFE STATES PATIENT WEIGHS DAILY AND HE IS AT DRY WEIGHT BUT DID NOT GIVE THE WEIGHT.  AWARE WILL ASK RHONDA  ABOUT RETURN TO WORK AND CONTACT with response

## 2019-01-20 NOTE — Telephone Encounter (Signed)
Reviewed w/ MDs Need to reduce his dry weight. If he can lose some weight, that will also help. Double his metoprolol dose and follow HR/BP Have him come in, in a couple of weeks with Isaac Laud, get a BMET and reassessment. Then decide if anything further is needed.

## 2019-01-20 NOTE — Telephone Encounter (Signed)
Spoke to patient- he states he was was calling to find out what conversation Suanne Marker had with Dr Claiborne Billings about patient last office visit. Patient states she is short of breath  And having trouble breathing  Walking and going upstairs. He is able to sleep through the night  now-  He uses an adjustable sleep bed. (keeps head elevated) patient aware will defer to [facility]

## 2019-01-20 NOTE — Telephone Encounter (Signed)
I did review his echo w/ Dr Claiborne Billings His valve numbers were a little different, but Dr Claiborne Billings did not feel there was any significant change, recheck in a year.  Make sure he is doing daily weights and nothing has changed. If he does not feel he is at his dry weight, can take extra Lasix for 2 days, try to drop a couple of pounds and see if that helps. If he feels he is at his dry weight, we can check a 2v CXR to make sure nothing acute is going on. Otherwise, he needs to make sure he walks consistently for exercise, flat ground, whatever pace works and build up to 30" a day before increasing the pace or distance. Thanks

## 2019-01-21 ENCOUNTER — Other Ambulatory Visit: Payer: Self-pay | Admitting: Cardiovascular Disease

## 2019-01-23 NOTE — Telephone Encounter (Signed)
Spoke to wife instruction given - bmp ordered per Suanne Marker,  Appointment scheduled 02/09/19 with meng pa

## 2019-02-04 ENCOUNTER — Telehealth: Payer: Self-pay | Admitting: Cardiovascular Disease

## 2019-02-04 DIAGNOSIS — I5023 Acute on chronic systolic (congestive) heart failure: Secondary | ICD-10-CM

## 2019-02-04 DIAGNOSIS — E785 Hyperlipidemia, unspecified: Secondary | ICD-10-CM

## 2019-02-04 DIAGNOSIS — I10 Essential (primary) hypertension: Secondary | ICD-10-CM

## 2019-02-04 NOTE — Telephone Encounter (Signed)
New Message  Patients wife is calling on his behalf. They are wanting the lab orders to be sent to the [facility] in [place] if possible. He would like to go to the location at [facility]. The fax number is [phone]. Can the orders be sent there and he will go tomorrow. Please advise.

## 2019-02-04 NOTE — Telephone Encounter (Signed)
Returned call to patient's wife left message on personal voice mail Lab orders for cmet and lipid panel faxed to [facility] in [place] at fax # [phone].

## 2019-02-05 DIAGNOSIS — I10 Essential (primary) hypertension: Secondary | ICD-10-CM | POA: Diagnosis not present

## 2019-02-05 DIAGNOSIS — E785 Hyperlipidemia, unspecified: Secondary | ICD-10-CM | POA: Diagnosis not present

## 2019-02-06 DIAGNOSIS — E1142 Type 2 diabetes mellitus with diabetic polyneuropathy: Secondary | ICD-10-CM | POA: Diagnosis not present

## 2019-02-06 DIAGNOSIS — Z794 Long term (current) use of insulin: Secondary | ICD-10-CM | POA: Diagnosis not present

## 2019-02-06 DIAGNOSIS — E1165 Type 2 diabetes mellitus with hyperglycemia: Secondary | ICD-10-CM | POA: Diagnosis not present

## 2019-02-06 LAB — COMPREHENSIVE METABOLIC PANEL
A/G RATIO: 2.1 (ref 1.2–2.2)
ALT: 34 IU/L (ref 0–44)
AST: 41 IU/L — ABNORMAL HIGH (ref 0–40)
Albumin: 4.6 g/dL (ref 3.8–4.8)
Alkaline Phosphatase: 37 IU/L — ABNORMAL LOW (ref 39–117)
BILIRUBIN TOTAL: 0.6 mg/dL (ref 0.0–1.2)
BUN/Creatinine Ratio: 22 (ref 10–24)
BUN: 38 mg/dL — AB (ref 8–27)
CO2: 23 mmol/L (ref 20–29)
Calcium: 10 mg/dL (ref 8.6–10.2)
Chloride: 100 mmol/L (ref 96–106)
Creatinine, Ser: 1.69 mg/dL — ABNORMAL HIGH (ref 0.76–1.27)
GFR calc Af Amer: 47 mL/min/{1.73_m2} — ABNORMAL LOW (ref >59)
GFR calc non Af Amer: 41 mL/min/{1.73_m2} — ABNORMAL LOW (ref >59)
GLUCOSE: 226 mg/dL — AB (ref 65–99)
Globulin, Total: 2.2 g/dL (ref 1.5–4.5)
Potassium: 5.3 mmol/L — ABNORMAL HIGH (ref 3.5–5.2)
Sodium: 139 mmol/L (ref 134–144)
Total Protein: 6.8 g/dL (ref 6.0–8.5)

## 2019-02-06 LAB — LIPID PANEL
Chol/HDL Ratio: 6.8 ratio — ABNORMAL HIGH (ref 0.0–5.0)
Cholesterol, Total: 156 mg/dL (ref 100–199)
HDL: 23 mg/dL — ABNORMAL LOW (ref >39)
LDL Calculated: 84 mg/dL (ref 0–99)
Triglycerides: 247 mg/dL — ABNORMAL HIGH (ref 0–149)
VLDL CHOLESTEROL CAL: 49 mg/dL — AB (ref 5–40)

## 2019-02-09 ENCOUNTER — Ambulatory Visit (INDEPENDENT_AMBULATORY_CARE_PROVIDER_SITE_OTHER): Payer: Medicare Other | Admitting: Physician Assistant

## 2019-02-09 ENCOUNTER — Encounter: Payer: Self-pay | Admitting: Physician Assistant

## 2019-02-09 VITALS — BP 120/62 | HR 73 | Ht 71.0 in | Wt 252.0 lb

## 2019-02-09 DIAGNOSIS — I5022 Chronic systolic (congestive) heart failure: Secondary | ICD-10-CM

## 2019-02-09 DIAGNOSIS — G4733 Obstructive sleep apnea (adult) (pediatric): Secondary | ICD-10-CM | POA: Diagnosis not present

## 2019-02-09 DIAGNOSIS — I10 Essential (primary) hypertension: Secondary | ICD-10-CM | POA: Diagnosis not present

## 2019-02-09 DIAGNOSIS — I4821 Permanent atrial fibrillation: Secondary | ICD-10-CM

## 2019-02-09 DIAGNOSIS — Z952 Presence of prosthetic heart valve: Secondary | ICD-10-CM | POA: Diagnosis not present

## 2019-02-09 DIAGNOSIS — E119 Type 2 diabetes mellitus without complications: Secondary | ICD-10-CM

## 2019-02-09 DIAGNOSIS — Z9989 Dependence on other enabling machines and devices: Secondary | ICD-10-CM | POA: Diagnosis not present

## 2019-02-09 DIAGNOSIS — I5023 Acute on chronic systolic (congestive) heart failure: Secondary | ICD-10-CM | POA: Diagnosis not present

## 2019-02-09 MED ORDER — FENOFIBRATE 145 MG PO TABS: 145.0000 mg | ORAL_TABLET | Freq: Every day | ORAL | 3 refills | Status: DC

## 2019-02-09 MED ORDER — EZETIMIBE 10 MG PO TABS: 10.0000 mg | ORAL_TABLET | Freq: Every day | ORAL | 3 refills | Status: AC

## 2019-02-09 MED ORDER — FUROSEMIDE 40 MG PO TABS: 40.0000 mg | ORAL_TABLET | Freq: Two times a day (BID) | ORAL | 3 refills | Status: DC

## 2019-02-09 NOTE — Patient Instructions (Signed)
Medication Instructions:  Your physician recommends that you continue on your current medications as directed. Please refer to the Current Medication list given to you today.  If you need a refill on your cardiac medications before your next appointment, please call your pharmacy.   Lab work: You will need to have labs (blood work) drawn today: BMET  If you have labs (blood work) drawn today and your tests are completely normal, you will receive your results only by: [name] MyChart Message (if you have MyChart) OR . A paper copy in the mail If you have any lab test that is abnormal or we need to change your treatment, we will call you to review the results.  Testing/Procedures: None   Follow-Up: Your physician recommends that you KEEP your scheduled follow-up appointment with Dr. Claiborne Billings in May    Any Other Special Instructions Will Be Listed Below (If Applicable).

## 2019-02-09 NOTE — Progress Notes (Addendum)
Cardiology Office Note    Date:  02/10/2019   ID:  Joseph Hoffman, Joseph Hoffman Jun 20, 1951, MRN [id number]  PCP:  Orpah Melter, MD  Cardiologist: Dr. Claiborne Billings  Chief Complaint  Patient presents with  . Follow-up    seen for Dr. Claiborne Billings.     History of Present Illness:  Joseph Hoffman is a 68 y.o. male with PMH of permanent atrial fibrillation, hypertension, DM 2, St Jude mechanical AVR in 0973, systolic heart failure, and obstructive sleep apnea on CPAP.  Cardiac catheterization obtained 09/19/2012 prior to AVR showed no significant coronary artery disease.  Echocardiogram obtained in May 2018 showed EF 50 to 55%.[name]  He was seen by Rosaria Ferries, PA-C on 01/06/2019, he was volume overloaded at the time.  He admits to fluid indiscretion, it was recommended for him to cut back on salt intake and fluid intake.  Repeat echocardiogram was obtained on 01/08/2019 which showed EF of 40 to 45%, severe biatrial enlargement, mechanical aortic valve present and functioning normally.  Patient presents today for follow-up.  He is continuing to lose weight.  Patient has stayed on 40 mg twice daily of Lasix.  He says if he was to take the 40 mg daily, he would quickly accumulate fluid again.  His renal function is stable.  Lab work from last Thursday does show hyperkalemia, I will obtain a basic metabolic panel to recheck.  Otherwise he appears to be euvolemic on physical exam.  I will continue him on the current medication.  Recent lab work shows uncontrolled hypertriglyceridemia, however his triglyceride has definitely improved when compared to 4 months ago.  I will continue him on the highest dose of Zetia and fenofibrate.   Past Medical History:  Diagnosis Date  . Anxiety   . Aortic stenosis   . Arthritis   . Asthma   . Asymptomatic LV dysfunction, EF 45%, normal coronary arteries on cardiac cath 09/18/12 09/30/2012  . CHF (congestive heart failure) ([place])   . Chronic anticoagulation, on Xarelto prior to admit  09/30/2012  . DM (diabetes mellitus) ([place]) 09/30/2012  . Hepatitis 2003  . Hypertension   . Myocardial infarction ([place])   . Permanent atrial fibrillation, since 1994 09/30/2012   stress test 02/08/12- normal study, no significant ischemia  . S/P AVR (aortic valve replacement), 09/30/2012   a. s/p mechcanical AVR in 09/2012 (on Coumadin)  . Sleep apnea    uses CPAP    Past Surgical History:  Procedure Laterality Date  . ABDOMINAL ANGIOGRAM  09/18/2012   Procedure: ABDOMINAL ANGIOGRAM;  Surgeon: Troy Sine, MD;  Location: [facility] CATH LAB;  Service: Cardiovascular;;  . AORTIC VALVE REPLACEMENT  09/29/2012   Procedure: AORTIC VALVE REPLACEMENT (AVR);  Surgeon: Gaye Pollack, MD;  Location: [facility];  Service: Open Heart Surgery;  Laterality: N/A;  . ARCH AORTOGRAM  09/18/2012   Procedure: ARCH AORTOGRAM;  Surgeon: Troy Sine, MD;  Location: [facility] CATH LAB;  Service: Cardiovascular;;  . CARDIAC CATHETERIZATION  09/18/12   severe calcific aortic stenosis, peak gradient 58mm, mean gradient 12mm, EF 45%  . CARDIAC VALVE REPLACEMENT     AVR 09-29-12  . CHOLECYSTECTOMY    . FRACTURE SURGERY    . LEFT AND RIGHT HEART CATHETERIZATION WITH CORONARY ANGIOGRAM N/A 09/18/2012   Procedure: LEFT AND RIGHT HEART CATHETERIZATION WITH CORONARY ANGIOGRAM;  Surgeon: Troy Sine, MD;  Location: [facility] CATH LAB;  Service: Cardiovascular;  Laterality: N/A;    Current Medications: Outpatient Medications Prior to  Visit  Medication Sig Dispense Refill  . acetaminophen (TYLENOL) 325 MG tablet Take 2 tablets (650 mg total) by mouth every 4 (four) hours as needed for pain or fever.    [name] albuterol (PROVENTIL HFA;VENTOLIN HFA) 108 (90 BASE) MCG/ACT inhaler Inhale 2 puffs into the lungs every 6 (six) hours as needed. For shortness of breath    . Ascorbic Acid (VITAMIN C) 1000 MG tablet Take 1,000 mg by mouth daily.    . B-D ULTRAFINE III SHORT PEN 31G X 8 MM MISC     . betamethasone valerate (VALISONE) 0.1 % cream  Apply 1 application topically daily as needed.     . cholecalciferol (VITAMIN D) 1000 UNITS tablet Take 1,000 Units by mouth daily.    . Chromium 200 MCG CAPS Take 200 mcg by mouth daily.    . clotrimazole-betamethasone (LOTRISONE) cream Apply 1 application topically as needed.    . digoxin (LANOXIN) 0.125 MG tablet TAKE 1 TABLET EVERY DAY 90 tablet 1  . EASY TOUCH LANCETS 30G/TWIST MISC     . empagliflozin (JARDIANCE) 25 MG TABS tablet Take 25 mg by mouth daily. 90 tablet 1  . escitalopram (LEXAPRO) 10 MG tablet Take 10 mg by mouth every evening.     . Fluticasone-Salmeterol (ADVAIR) 250-50 MCG/DOSE AEPB Inhale 1 puff into the lungs daily. For asthma related symptoms    . glimepiride (AMARYL) 2 MG tablet Take 2 mg by mouth daily with breakfast.     . Glucosamine-Chondroit-Vit C-Mn (GLUCOSAMINE 1500 COMPLEX PO) Take 1,500 mg by mouth daily.    Vanessa Kick Ethyl (VASCEPA) 1 g CAPS Take 2 capsules (2 g total) by mouth 2 (two) times daily. 360 capsule 3  . insulin glargine (LANTUS) 100 UNIT/ML injection Inject 5-20 Units into the skin 2 (two) times daily. 5-20 units at breakfast and dinner depending on blood sugar levels    . Liraglutide (VICTOZA [place]) Inject 1.8 mg into the skin 1 day or 1 dose.    . metFORMIN (GLUCOPHAGE-XR) 500 MG 24 hr tablet Take 2,000 mg by mouth at bedtime.    . metoprolol succinate (TOPROL XL) 25 MG 24 hr tablet Take 1 tablet (25 mg total) by mouth daily. 90 tablet 2  . Multiple Vitamins-Minerals (CENTRUM SPECIALIST HEART PO) Take 1 tablet by mouth 2 (two) times daily.    [name] warfarin (COUMADIN) 5 MG tablet Take as instructed with 10 mg. 90 tablet 1  . ezetimibe (ZETIA) 10 MG tablet TAKE 1 TABLET (10 MG TOTAL) BY MOUTH DAILY. 90 tablet 0  . fenofibrate (TRICOR) 145 MG tablet TAKE 1 TABLET (145 MG TOTAL) BY MOUTH DAILY. 90 tablet 0  . furosemide (LASIX) 40 MG tablet Take 1 tablet (40 mg total) by mouth daily. (Patient taking differently: Take 40 mg by mouth 2 (two) times daily. )  90 tablet 3   No facility-administered medications prior to visit.      Allergies:   Iohexol; Niacin and related; and Penicillins   Social History   Socioeconomic History  . Marital status: Married    Spouse name: [name]  . Number of children: 3  . Years of education: 15+  . Highest education level: Not on file  Occupational History    Employer: [employer]  . Financial resource strain: Not on file  . Food insecurity:    Worry: Not on file    Inability: Not on file  . Transportation needs:    Medical: Not on file  Non-medical: Not on file  Tobacco Use  . Smoking status: Former Smoker    Packs/day: 0.25    Years: 10.00    Pack years: 2.50    Types: Cigarettes    Last attempt to quit: 09/25/1982    Years since quitting: 36.4  . Smokeless tobacco: Former Network engineer and Sexual Activity  . Alcohol use: Yes    Comment: occ. beer/wine  . Drug use: No  . Sexual activity: Yes    Birth control/protection: None  Lifestyle  . Physical activity:    Days per week: Not on file    Minutes per session: Not on file  . Stress: Not on file  Relationships  . Social connections:    Talks on phone: Not on file    Gets together: Not on file    Attends religious service: Not on file    Active member of club or organization: Not on file    Attends meetings of clubs or organizations: Not on file    Relationship status: Not on file  Other Topics Concern  . Not on file  Social History Narrative   Patient is married [name]) and lives with his wife and son.   Patient has three children.   Patient is working full-time.   Patient has a college education.   Patient is right handed.   Patient drinks about 2-3 cups of coffee daily.     Family History:  The patient's family history includes Diabetes in his father; Heart attack (age of onset: 46) in his brother; Hyperlipidemia (age of onset: 75) in his brother; Hypertension in his mother.   ROS:   Please see the  history of present illness.    ROS All other systems reviewed and are negative.   PHYSICAL EXAM:   VS:  BP 120/62   Pulse 73   Ht 5\' 11"  (1.803 m)   Wt 252 lb (114.3 kg)   BMI 35.15 kg/m    GEN: Well nourished, well developed, in no acute distress  HEENT: normal  Neck: no JVD, carotid bruits, or masses Cardiac: Irregularly irregular.; no murmurs, rubs, or gallops,no edema.  Crisp mechanical click. Respiratory:  clear to auscultation bilaterally, normal work of breathing GI: soft, nontender, nondistended, + BS MS: no deformity or atrophy  Skin: warm and dry, no rash Neuro:  Alert and Oriented x 3, Strength and sensation are intact Psych: euthymic mood, full affect  Wt Readings from Last 3 Encounters:  02/09/19 252 lb (114.3 kg)  01/13/19 259 lb (117.5 kg)  01/06/19 263 lb 4 oz (119.4 kg)      Studies/Labs Reviewed:   EKG:  EKG is not ordered today.    Recent Labs: 04/03/2018: Hemoglobin 13.8; NT-Pro BNP 503; Platelets 168; TSH 1.280 02/05/2019: ALT 34 02/09/2019: BUN 39; Creatinine, Ser 2.11; Potassium 5.2; Sodium 138   Lipid Panel    Component Value Date/Time   CHOL 156 02/05/2019 1006   CHOL 166 08/23/2014 0853   TRIG 247 (H) 02/05/2019 1006   TRIG 274 (H) 08/23/2014 0853   HDL 23 (L) 02/05/2019 1006   HDL 26 (L) 08/23/2014 0853   CHOLHDL 6.8 (H) 02/05/2019 1006   CHOLHDL 4.6 08/24/2013 1023   VLDL 38 08/24/2013 1023   LDLCALC 84 02/05/2019 1006   LDLCALC 85 08/23/2014 0853    Additional studies/ records that were reviewed today include:   Cath 09/19/2012 ANGIOGRAPHIC DATA:  The left main essentially was a common ostium which immediately bifurcated into the  LAD and circumflex system.  The LAD had mild luminal irregularity but was without significant stenoses and gave rise to 3 proximal septal perforating arteries and 2 major diagonal vessels and extended to the apex.  The circumflex vessel was free of significant disease.  It gave rise to a major  bifurcating marginal branch, which extended to the apex.  Selective angiography in the right coronary artery revealed a fairly normal right coronary artery, which supplied the PDA and posterolateral vessel.  IMPRESSION: 1. Severe calcific aortic stenosis with a peak to peak gradient of 65     mm, mean gradient of 50 mm, and calculated aortic valve area at 0.9-     1.0 square cm. 2. Mild left ventricular dysfunction with an ejection fraction of 45%. 3. No significant coronary obstructive disease.   Echo 01/08/2019 IMPRESSIONS    1. The left ventricle has mild-moderately reduced systolic function of 22-02%. The cavity size was moderately increased. There is no increased left ventricular wall thickness. Echo evidence of pseudonormalization in diastolic relaxation Elevated left  ventricular end-diastolic pressure Left ventrical global hypokinesis without regional wall motion abnormalities.  2. Left atrial size was severely dilated.  3. Right atrial size was severely dilated.  4. The mitral valve is normal in structure. There is moderate mitral annular calcification present. mild to moderately stenosed mitral valve stenosis.  5. The tricuspid valve is normal in structure.  6. A St. Jude mechanical bioprosthesis is present. Aortic valve regurgitation is moderate by color flow Doppler.  7. Compared with the echo 04/2018, mechanical aortic valve mean gradient has decreased from 26 mmHg to 16 mmHg, which is mildly elevated.  8. The pulmonic valve was normal in structure.  9. There is mild dilatation of the ascending aorta. 10. The inferior vena cava was dilated in size with <50% respiratory variability.    ASSESSMENT:    1. Chronic systolic heart failure ([place])   2. Permanent atrial fibrillation   3. Essential hypertension   4. Controlled type 2 diabetes mellitus without complication, without long-term current use of insulin ([place])   5. H/O mechanical aortic valve replacement   6. OSA  on CPAP      PLAN:  In order of problems listed above:  1. Chronic systolic heart failure: Euvolemic on physical exam on the current dosage of diuretic.  Continue 40 mg twice daily of Lasix.  Renal function stable on last lab work however potassium is high.  I will repeat basic metabolic panel today  2. Permanent atrial fibrillation: Rate controlled on current therapy.  Continue Coumadin  3. Hypertension: Blood pressure stable on current therapy  4. DM2: Managed by primary care provider. On insulin  5. History of mechanical aortic valve replacement: Stable on recent echocardiogram  6. Obstructive sleep apnea: On CPAP    Medication Adjustments/Labs and Tests Ordered: Current medicines are reviewed at length with the patient today.  Concerns regarding medicines are outlined above.  Medication changes, Labs and Tests ordered today are listed in the Patient Instructions below. Patient Instructions  Medication Instructions:  Your physician recommends that you continue on your current medications as directed. Please refer to the Current Medication list given to you today.  If you need a refill on your cardiac medications before your next appointment, please call your pharmacy.   Lab work: You will need to have labs (blood work) drawn today: BMET  If you have labs (blood work) drawn today and your tests are completely normal, you will receive your  results only by: [name] MyChart Message (if you have MyChart) OR . A paper copy in the mail If you have any lab test that is abnormal or we need to change your treatment, we will call you to review the results.  Testing/Procedures: None   Follow-Up: Your physician recommends that you KEEP your scheduled follow-up appointment with Dr. Claiborne Billings in May    Any Other Special Instructions Will Be Listed Below (If Applicable).       Hilbert Corrigan, [place]  02/10/2019 9:21 AM    [address], [place],  [place]  [address] Phone: [phone]; Fax: [phone]

## 2019-02-10 ENCOUNTER — Encounter: Payer: Self-pay | Admitting: Physician Assistant

## 2019-02-10 ENCOUNTER — Telehealth: Payer: Self-pay

## 2019-02-10 DIAGNOSIS — I4891 Unspecified atrial fibrillation: Secondary | ICD-10-CM | POA: Diagnosis not present

## 2019-02-10 DIAGNOSIS — Z7901 Long term (current) use of anticoagulants: Secondary | ICD-10-CM | POA: Diagnosis not present

## 2019-02-10 LAB — BASIC METABOLIC PANEL
BUN/Creatinine Ratio: 18 (ref 10–24)
BUN: 39 mg/dL — ABNORMAL HIGH (ref 8–27)
CO2: 22 mmol/L (ref 20–29)
Calcium: 10.7 mg/dL — ABNORMAL HIGH (ref 8.6–10.2)
Chloride: 94 mmol/L — ABNORMAL LOW (ref 96–106)
Creatinine, Ser: 2.11 mg/dL — ABNORMAL HIGH (ref 0.76–1.27)
GFR calc Af Amer: 36 mL/min/{1.73_m2} — ABNORMAL LOW (ref >59)
GFR, EST NON AFRICAN AMERICAN: 31 mL/min/{1.73_m2} — AB (ref >59)
Glucose: 336 mg/dL — ABNORMAL HIGH (ref 65–99)
POTASSIUM: 5.2 mmol/L (ref 3.5–5.2)
Sodium: 138 mmol/L (ref 134–144)

## 2019-02-10 NOTE — Progress Notes (Signed)
LVM FOR PATIIENT TO CALL BACK FOR LAB RESULTS

## 2019-02-10 NOTE — Telephone Encounter (Signed)
LEFT VOICE MESSAGE FOR PATIENT TO CALL BACK FOR LAB RESULTS

## 2019-02-16 ENCOUNTER — Other Ambulatory Visit: Payer: Self-pay

## 2019-02-16 ENCOUNTER — Telehealth: Payer: Self-pay

## 2019-02-16 DIAGNOSIS — I5022 Chronic systolic (congestive) heart failure: Secondary | ICD-10-CM

## 2019-02-16 DIAGNOSIS — Z79899 Other long term (current) drug therapy: Secondary | ICD-10-CM

## 2019-02-16 NOTE — Telephone Encounter (Signed)
Follow up   Patients wife is returning call in reference to labs. Please call.

## 2019-02-16 NOTE — Telephone Encounter (Signed)
Left voice message for the patient on both cell phone and home phone to call the office back urgently about lab results.

## 2019-02-16 NOTE — Telephone Encounter (Signed)
Spoke with the patient's wife Vale Mousseau , she had verbal permission from the patient to call back and speak with me. I gave Mrs. Bagnall the results and informed her of the medication hold and changes per the PA Almyra Deforest with a return to the office for repeat lab work in 1-2 weeks. She verbalized an understanding and stated that she will inform the patient and if he has any questions she will have him call the office.

## 2019-02-16 NOTE — Telephone Encounter (Signed)
Returned call to the patient's wife Calyb Mcquarrie, she had verbal permission from the patient to call back and speak with me. I gave Mrs. Leckey the results and informed her of the medication hold and changes per the PA with a return to the office for repeat lab work. She verbalized an understanding and stated that she will inform the patient and if he has any questions she will have him call the office.

## 2019-02-16 NOTE — Progress Notes (Signed)
Left voice message for the patient on both cell phone and home phone to call the office back urgently about lab results.

## 2019-02-16 NOTE — Telephone Encounter (Signed)
Terrah, please check with patient to see if he got the message after most recent lab showing worsening kidney function that he needs repeat BMET this week.

## 2019-03-11 ENCOUNTER — Telehealth: Payer: Self-pay | Admitting: Cardiovascular Disease

## 2019-03-11 DIAGNOSIS — Z79899 Other long term (current) drug therapy: Secondary | ICD-10-CM

## 2019-03-11 NOTE — Telephone Encounter (Signed)
2nd attempt. Unable to lmom.

## 2019-03-11 NOTE — Telephone Encounter (Signed)
New Message    Pt c/o Shortness Of Breath: STAT if SOB developed within the last 24 hours or pt is noticeably SOB on the phone  1. Are you currently SOB (can you hear that pt is SOB on the phone)? Not really, but when he breathes in, he can hear a click   2. How long have you been experiencing SOB? Over a week now   3. Are you SOB when sitting or when up moving around? At night, he sleeps with a sleep apnea machine and he has a bed that slants a little bit and he cant breathe. He will get up and sleep in a chair. Some nights he only gets a couple hours of sleep   4. Are you currently experiencing any other symptoms? Pt doesn't want to go to emergency room and he is and asthmatic and is wondering if he can get Oxygen

## 2019-03-11 NOTE — Telephone Encounter (Signed)
Returned the pt call. Unable to lmom. Pt voicemail is not set up.

## 2019-03-12 NOTE — Telephone Encounter (Signed)
Left message with pt's wife for pt to call back ./cy

## 2019-03-13 ENCOUNTER — Other Ambulatory Visit: Payer: Self-pay | Admitting: *Deleted

## 2019-03-13 DIAGNOSIS — Z79899 Other long term (current) drug therapy: Secondary | ICD-10-CM

## 2019-03-13 DIAGNOSIS — R0609 Other forms of dyspnea: Secondary | ICD-10-CM | POA: Diagnosis not present

## 2019-03-13 DIAGNOSIS — I5032 Chronic diastolic (congestive) heart failure: Secondary | ICD-10-CM | POA: Diagnosis not present

## 2019-03-13 DIAGNOSIS — I5022 Chronic systolic (congestive) heart failure: Secondary | ICD-10-CM

## 2019-03-13 DIAGNOSIS — I350 Nonrheumatic aortic (valve) stenosis: Secondary | ICD-10-CM | POA: Diagnosis not present

## 2019-03-13 DIAGNOSIS — I428 Other cardiomyopathies: Secondary | ICD-10-CM | POA: Diagnosis not present

## 2019-03-13 NOTE — Telephone Encounter (Signed)
Follow Up:    Pt went to [facility] in [place] and he said they did not have an order. Pt is back home

## 2019-03-13 NOTE — Telephone Encounter (Signed)
Spoke with pt and has been having trouble breathing since Lasix was decreased Per pt not sleeping at night is trying to rest in recliner but is sitting straight up last week was trying to catch breath and bit his tongue and also hears (clicking noise when breathing) Per pt weight is stable, edema noted to l ankle  no temp (98.0) Pt did take 80 mg of Lasix yesterday with some relief Pt coming in today for BMET Will forward to Dr Claiborne Billings for review and recommendations ./cy

## 2019-03-13 NOTE — Telephone Encounter (Signed)
Pt coming to [facility] office for labs today ./cy

## 2019-03-13 NOTE — Telephone Encounter (Signed)
Per pt's wife call pt need and INR as well if possible today pleaser thank you.   Please give her a call if any questions.

## 2019-03-13 NOTE — Telephone Encounter (Signed)
ok 

## 2019-03-14 LAB — BASIC METABOLIC PANEL
BUN/Creatinine Ratio: 23 (ref 10–24)
BUN: 42 mg/dL — ABNORMAL HIGH (ref 8–27)
CO2: 24 mmol/L (ref 20–29)
Calcium: 9.9 mg/dL (ref 8.6–10.2)
Chloride: 98 mmol/L (ref 96–106)
Creatinine, Ser: 1.83 mg/dL — ABNORMAL HIGH (ref 0.76–1.27)
GFR calc Af Amer: 43 mL/min/{1.73_m2} — ABNORMAL LOW (ref >59)
GFR calc non Af Amer: 37 mL/min/{1.73_m2} — ABNORMAL LOW (ref >59)
Glucose: 320 mg/dL — ABNORMAL HIGH (ref 65–99)
Potassium: 5 mmol/L (ref 3.5–5.2)
Sodium: 139 mmol/L (ref 134–144)

## 2019-03-14 LAB — PROTIME-INR
INR: 4 — ABNORMAL HIGH (ref 0.8–1.2)
Prothrombin Time: 39.6 s — ABNORMAL HIGH (ref 9.1–12.0)

## 2019-03-16 NOTE — Telephone Encounter (Signed)
Spoke with pt's wife re INR and Dr Olen Pel follows copy sent via epic to Dr Olen Pel office and also pt continues to have breathing issues usually occurs at night  for 2 nights can't sleep and third night will be fine and during the day no problems Per wife pt is currently sleeping as pt was unable to fall asleep until 4:00 am this morning Pt is currently take Furosemide 40 mg 1 1/2 tab daily. Will forward to [facility] PA for review and recommendations./cy

## 2019-03-16 NOTE — Telephone Encounter (Signed)
Pt's wife aware will forward to Dr Evette Georges nurse to make arrangements for telehealth visit in 1-2  weeks per Hao./cy

## 2019-03-16 NOTE — Telephone Encounter (Signed)
Conflicting picture when compare to the labs, he was dehydrated, therefore lasix was cut back. But now has signs of volume overload, ok to take additional half a tablet of lasix as needed. Need to move his next visit in May to a telehealth visit with Dr. Claiborne Billings in 1-2 weeks

## 2019-03-17 NOTE — Telephone Encounter (Signed)
Called patient, LVM to set up appointment.

## 2019-03-24 NOTE — Telephone Encounter (Signed)
Attempted to contact patient to be set up for an appointment.  Patient did not answer, LVM with call back number.

## 2019-03-25 DIAGNOSIS — I4891 Unspecified atrial fibrillation: Secondary | ICD-10-CM | POA: Diagnosis not present

## 2019-03-25 DIAGNOSIS — Z7901 Long term (current) use of anticoagulants: Secondary | ICD-10-CM | POA: Diagnosis not present

## 2019-04-09 ENCOUNTER — Telehealth (INDEPENDENT_AMBULATORY_CARE_PROVIDER_SITE_OTHER): Payer: Medicare Other | Admitting: Cardiology

## 2019-04-09 ENCOUNTER — Encounter (HOSPITAL_COMMUNITY): Payer: Self-pay | Admitting: Emergency Medicine

## 2019-04-09 ENCOUNTER — Encounter: Payer: Self-pay | Admitting: Cardiology

## 2019-04-09 ENCOUNTER — Inpatient Hospital Stay (HOSPITAL_COMMUNITY)
Admission: EM | Admit: 2019-04-09 | Discharge: 2019-04-12 | DRG: 291 | Disposition: A | Discharge status: Home / Self Care | Payer: Medicare Other | Source: Ambulatory Visit | Attending: Internal Medicine | Admitting: Internal Medicine

## 2019-04-09 ENCOUNTER — Telehealth: Payer: Self-pay

## 2019-04-09 ENCOUNTER — Telehealth: Payer: Self-pay | Admitting: Cardiovascular Disease

## 2019-04-09 ENCOUNTER — Emergency Department (HOSPITAL_COMMUNITY): Payer: Medicare Other

## 2019-04-09 ENCOUNTER — Other Ambulatory Visit: Payer: Self-pay

## 2019-04-09 VITALS — BP 112/78 | HR 73 | Ht 71.0 in | Wt 261.0 lb

## 2019-04-09 DIAGNOSIS — Z79899 Other long term (current) drug therapy: Secondary | ICD-10-CM | POA: Diagnosis not present

## 2019-04-09 DIAGNOSIS — Z880 Allergy status to penicillin: Secondary | ICD-10-CM

## 2019-04-09 DIAGNOSIS — Z794 Long term (current) use of insulin: Secondary | ICD-10-CM | POA: Diagnosis not present

## 2019-04-09 DIAGNOSIS — Z91041 Radiographic dye allergy status: Secondary | ICD-10-CM | POA: Diagnosis not present

## 2019-04-09 DIAGNOSIS — I5043 Acute on chronic combined systolic (congestive) and diastolic (congestive) heart failure: Secondary | ICD-10-CM | POA: Diagnosis present

## 2019-04-09 DIAGNOSIS — I428 Other cardiomyopathies: Secondary | ICD-10-CM

## 2019-04-09 DIAGNOSIS — I5023 Acute on chronic systolic (congestive) heart failure: Secondary | ICD-10-CM | POA: Diagnosis not present

## 2019-04-09 DIAGNOSIS — Z952 Presence of prosthetic heart valve: Secondary | ICD-10-CM

## 2019-04-09 DIAGNOSIS — N183 Chronic kidney disease, stage 3 unspecified: Secondary | ICD-10-CM | POA: Insufficient documentation

## 2019-04-09 DIAGNOSIS — I110 Hypertensive heart disease with heart failure: Secondary | ICD-10-CM | POA: Diagnosis not present

## 2019-04-09 DIAGNOSIS — E785 Hyperlipidemia, unspecified: Secondary | ICD-10-CM | POA: Diagnosis present

## 2019-04-09 DIAGNOSIS — E1122 Type 2 diabetes mellitus with diabetic chronic kidney disease: Secondary | ICD-10-CM | POA: Diagnosis present

## 2019-04-09 DIAGNOSIS — G4733 Obstructive sleep apnea (adult) (pediatric): Secondary | ICD-10-CM | POA: Diagnosis present

## 2019-04-09 DIAGNOSIS — I130 Hypertensive heart and chronic kidney disease with heart failure and stage 1 through stage 4 chronic kidney disease, or unspecified chronic kidney disease: Secondary | ICD-10-CM | POA: Diagnosis not present

## 2019-04-09 DIAGNOSIS — I361 Nonrheumatic tricuspid (valve) insufficiency: Secondary | ICD-10-CM | POA: Diagnosis not present

## 2019-04-09 DIAGNOSIS — I5041 Acute combined systolic (congestive) and diastolic (congestive) heart failure: Secondary | ICD-10-CM | POA: Diagnosis present

## 2019-04-09 DIAGNOSIS — I4821 Permanent atrial fibrillation: Secondary | ICD-10-CM | POA: Diagnosis present

## 2019-04-09 DIAGNOSIS — I248 Other forms of acute ischemic heart disease: Secondary | ICD-10-CM | POA: Diagnosis present

## 2019-04-09 DIAGNOSIS — F419 Anxiety disorder, unspecified: Secondary | ICD-10-CM | POA: Diagnosis present

## 2019-04-09 DIAGNOSIS — I252 Old myocardial infarction: Secondary | ICD-10-CM | POA: Diagnosis not present

## 2019-04-09 DIAGNOSIS — Z888 Allergy status to other drugs, medicaments and biological substances status: Secondary | ICD-10-CM

## 2019-04-09 DIAGNOSIS — Z0389 Encounter for observation for other suspected diseases and conditions ruled out: Secondary | ICD-10-CM

## 2019-04-09 DIAGNOSIS — Z7901 Long term (current) use of anticoagulants: Secondary | ICD-10-CM

## 2019-04-09 DIAGNOSIS — Z8249 Family history of ischemic heart disease and other diseases of the circulatory system: Secondary | ICD-10-CM | POA: Diagnosis not present

## 2019-04-09 DIAGNOSIS — Z8349 Family history of other endocrine, nutritional and metabolic diseases: Secondary | ICD-10-CM

## 2019-04-09 DIAGNOSIS — J90 Pleural effusion, not elsewhere classified: Secondary | ICD-10-CM | POA: Diagnosis not present

## 2019-04-09 DIAGNOSIS — I509 Heart failure, unspecified: Secondary | ICD-10-CM | POA: Diagnosis not present

## 2019-04-09 DIAGNOSIS — M1990 Unspecified osteoarthritis, unspecified site: Secondary | ICD-10-CM | POA: Diagnosis present

## 2019-04-09 DIAGNOSIS — R0989 Other specified symptoms and signs involving the circulatory and respiratory systems: Secondary | ICD-10-CM | POA: Diagnosis not present

## 2019-04-09 DIAGNOSIS — J449 Chronic obstructive pulmonary disease, unspecified: Secondary | ICD-10-CM | POA: Diagnosis present

## 2019-04-09 DIAGNOSIS — Z833 Family history of diabetes mellitus: Secondary | ICD-10-CM

## 2019-04-09 DIAGNOSIS — I351 Nonrheumatic aortic (valve) insufficiency: Secondary | ICD-10-CM | POA: Diagnosis not present

## 2019-04-09 DIAGNOSIS — I340 Nonrheumatic mitral (valve) insufficiency: Secondary | ICD-10-CM | POA: Diagnosis not present

## 2019-04-09 LAB — CBC
HCT: 44 % (ref 39.0–52.0)
Hemoglobin: 14.1 g/dL (ref 13.0–17.0)
MCH: 27 pg (ref 26.0–34.0)
MCHC: 32 g/dL (ref 30.0–36.0)
MCV: 84.3 fL (ref 80.0–100.0)
Platelets: 202 10*3/uL (ref 150–400)
RBC: 5.22 MIL/uL (ref 4.22–5.81)
RDW: 16.9 % — ABNORMAL HIGH (ref 11.5–15.5)
WBC: 10.4 10*3/uL (ref 4.0–10.5)
nRBC: 0 % (ref 0.0–0.2)

## 2019-04-09 LAB — GLUCOSE, CAPILLARY
Glucose-Capillary: 169 mg/dL — ABNORMAL HIGH (ref 70–99)
Glucose-Capillary: 219 mg/dL — ABNORMAL HIGH (ref 70–99)

## 2019-04-09 LAB — DIGOXIN LEVEL: Digoxin Level: 0.3 ng/mL — ABNORMAL LOW (ref 0.8–2.0)

## 2019-04-09 LAB — PROTIME-INR
INR: 2.5 — ABNORMAL HIGH (ref 0.8–1.2)
Prothrombin Time: 26.8 seconds — ABNORMAL HIGH (ref 11.4–15.2)

## 2019-04-09 LAB — BASIC METABOLIC PANEL
Anion gap: 13 (ref 5–15)
BUN: 39 mg/dL — ABNORMAL HIGH (ref 8–23)
CO2: 23 mmol/L (ref 22–32)
Calcium: 9.6 mg/dL (ref 8.9–10.3)
Chloride: 102 mmol/L (ref 98–111)
Creatinine, Ser: 1.72 mg/dL — ABNORMAL HIGH (ref 0.61–1.24)
GFR calc Af Amer: 46 mL/min — ABNORMAL LOW (ref >60)
GFR calc non Af Amer: 40 mL/min — ABNORMAL LOW (ref >60)
Glucose, Bld: 91 mg/dL (ref 70–99)
Potassium: 4.1 mmol/L (ref 3.5–5.1)
Sodium: 138 mmol/L (ref 135–145)

## 2019-04-09 LAB — CBG MONITORING, ED
Glucose-Capillary: 124 mg/dL — ABNORMAL HIGH (ref 70–99)
Glucose-Capillary: 79 mg/dL (ref 70–99)

## 2019-04-09 LAB — TSH: TSH: 2.235 u[IU]/mL (ref 0.350–4.500)

## 2019-04-09 LAB — BRAIN NATRIURETIC PEPTIDE: B Natriuretic Peptide: 222.9 pg/mL — ABNORMAL HIGH (ref 0.0–100.0)

## 2019-04-09 LAB — TROPONIN I: Troponin I: 0.14 ng/mL (ref <0.03)

## 2019-04-09 LAB — MAGNESIUM: Magnesium: 2.6 mg/dL — ABNORMAL HIGH (ref 1.7–2.4)

## 2019-04-09 LAB — MRSA PCR SCREENING: MRSA by PCR: NEGATIVE

## 2019-04-09 MED ORDER — EZETIMIBE 10 MG PO TABS
10.0000 mg | ORAL_TABLET | Freq: Every day | ORAL | Status: DC
  Administered 2019-04-09 – 2019-04-11 (×3): 10 mg via ORAL
  Filled 2019-04-09 (×3): qty 1

## 2019-04-09 MED ORDER — SODIUM CHLORIDE 0.9% FLUSH
3.0000 mL | Freq: Two times a day (BID) | INTRAVENOUS | Status: DC
  Administered 2019-04-09 – 2019-04-11 (×5): 3 mL via INTRAVENOUS

## 2019-04-09 MED ORDER — ESCITALOPRAM OXALATE 10 MG PO TABS
10.0000 mg | ORAL_TABLET | Freq: Every evening | ORAL | Status: DC
  Administered 2019-04-09 – 2019-04-11 (×3): 10 mg via ORAL
  Filled 2019-04-09 (×3): qty 1

## 2019-04-09 MED ORDER — VITAMIN C 500 MG PO TABS
1000.0000 mg | ORAL_TABLET | Freq: Every day | ORAL | Status: DC
  Administered 2019-04-09 – 2019-04-11 (×3): 1000 mg via ORAL
  Filled 2019-04-09 (×3): qty 2

## 2019-04-09 MED ORDER — DOCUSATE SODIUM 100 MG PO CAPS
200.0000 mg | ORAL_CAPSULE | Freq: Every day | ORAL | Status: DC
  Administered 2019-04-09 – 2019-04-11 (×3): 200 mg via ORAL
  Filled 2019-04-09 (×3): qty 2

## 2019-04-09 MED ORDER — OMEGA-3-ACID ETHYL ESTERS 1 G PO CAPS
1.0000 g | ORAL_CAPSULE | Freq: Every day | ORAL | Status: DC
  Administered 2019-04-10 – 2019-04-12 (×3): 1 g via ORAL
  Filled 2019-04-09 (×3): qty 1

## 2019-04-09 MED ORDER — ALPRAZOLAM 0.25 MG PO TABS: 0.2500 mg | ORAL_TABLET | Freq: Two times a day (BID) | ORAL | Status: DC | PRN

## 2019-04-09 MED ORDER — FUROSEMIDE 10 MG/ML IJ SOLN: 80.0000 mg | Freq: Two times a day (BID) | INTRAMUSCULAR | Status: DC

## 2019-04-09 MED ORDER — DIGOXIN 125 MCG PO TABS
0.1250 mg | ORAL_TABLET | Freq: Every day | ORAL | Status: DC
  Administered 2019-04-09 – 2019-04-11 (×3): 0.125 mg via ORAL
  Filled 2019-04-09 (×3): qty 1

## 2019-04-09 MED ORDER — FENOFIBRATE 160 MG PO TABS
160.0000 mg | ORAL_TABLET | Freq: Every day | ORAL | Status: DC
  Administered 2019-04-10 – 2019-04-12 (×3): 160 mg via ORAL
  Filled 2019-04-09 (×3): qty 1

## 2019-04-09 MED ORDER — SODIUM CHLORIDE 0.9% FLUSH: 3.0000 mL | INTRAVENOUS | Status: DC | PRN

## 2019-04-09 MED ORDER — POTASSIUM CHLORIDE CRYS ER 20 MEQ PO TBCR
20.0000 meq | EXTENDED_RELEASE_TABLET | Freq: Two times a day (BID) | ORAL | Status: DC
  Administered 2019-04-09 – 2019-04-12 (×6): 20 meq via ORAL
  Filled 2019-04-09 (×6): qty 1

## 2019-04-09 MED ORDER — INSULIN GLARGINE 100 UNIT/ML ~~LOC~~ SOLN
25.0000 [IU] | Freq: Two times a day (BID) | SUBCUTANEOUS | Status: DC
  Administered 2019-04-10 – 2019-04-12 (×5): 25 [IU] via SUBCUTANEOUS
  Filled 2019-04-09 (×6): qty 0.25

## 2019-04-09 MED ORDER — VITAMIN D 25 MCG (1000 UNIT) PO TABS
1000.0000 [IU] | ORAL_TABLET | Freq: Every day | ORAL | Status: DC
  Administered 2019-04-09 – 2019-04-11 (×3): 1000 [IU] via ORAL
  Filled 2019-04-09 (×3): qty 1

## 2019-04-09 MED ORDER — ALBUTEROL SULFATE (2.5 MG/3ML) 0.083% IN NEBU: 2.5000 mg | INHALATION_SOLUTION | Freq: Four times a day (QID) | RESPIRATORY_TRACT | Status: DC | PRN

## 2019-04-09 MED ORDER — ZOLPIDEM TARTRATE 5 MG PO TABS: 5.0000 mg | ORAL_TABLET | Freq: Every evening | ORAL | Status: DC | PRN

## 2019-04-09 MED ORDER — SODIUM CHLORIDE 0.9% FLUSH
3.0000 mL | Freq: Once | INTRAVENOUS | Status: AC
  Administered 2019-04-09: 3 mL via INTRAVENOUS

## 2019-04-09 MED ORDER — MOMETASONE FURO-FORMOTEROL FUM 200-5 MCG/ACT IN AERO
2.0000 | INHALATION_SPRAY | Freq: Two times a day (BID) | RESPIRATORY_TRACT | Status: DC
  Administered 2019-04-10 – 2019-04-12 (×5): 2 via RESPIRATORY_TRACT
  Filled 2019-04-09: qty 8.8

## 2019-04-09 MED ORDER — SODIUM CHLORIDE 0.9 % IV SOLN: 250.0000 mL | INTRAVENOUS | Status: DC | PRN

## 2019-04-09 MED ORDER — INSULIN ASPART 100 UNIT/ML ~~LOC~~ SOLN
0.0000 [IU] | Freq: Three times a day (TID) | SUBCUTANEOUS | Status: DC
  Administered 2019-04-10: 19:00:00 7 [IU] via SUBCUTANEOUS
  Administered 2019-04-11: 07:00:00 1 [IU] via SUBCUTANEOUS
  Administered 2019-04-11: 3 [IU] via SUBCUTANEOUS
  Administered 2019-04-11: 5 [IU] via SUBCUTANEOUS
  Administered 2019-04-12: 12:00:00 3 [IU] via SUBCUTANEOUS

## 2019-04-09 MED ORDER — ONDANSETRON HCL 4 MG/2ML IJ SOLN: 4.0000 mg | Freq: Four times a day (QID) | INTRAMUSCULAR | Status: DC | PRN

## 2019-04-09 MED ORDER — FUROSEMIDE 10 MG/ML IJ SOLN
80.0000 mg | Freq: Two times a day (BID) | INTRAMUSCULAR | Status: DC
  Administered 2019-04-09 – 2019-04-11 (×4): 80 mg via INTRAVENOUS
  Filled 2019-04-09 (×4): qty 8

## 2019-04-09 MED ORDER — WARFARIN - PHARMACIST DOSING INPATIENT
Freq: Every day | Status: DC
  Administered 2019-04-09 – 2019-04-10 (×2)

## 2019-04-09 MED ORDER — ACETAMINOPHEN 325 MG PO TABS: 650.0000 mg | ORAL_TABLET | ORAL | Status: DC | PRN

## 2019-04-09 MED ORDER — INSULIN ASPART 100 UNIT/ML ~~LOC~~ SOLN
0.0000 [IU] | Freq: Every day | SUBCUTANEOUS | Status: DC
  Administered 2019-04-09: 2 [IU] via SUBCUTANEOUS
  Administered 2019-04-10 – 2019-04-11 (×2): 4 [IU] via SUBCUTANEOUS

## 2019-04-09 MED ORDER — WARFARIN SODIUM 2.5 MG PO TABS
12.5000 mg | ORAL_TABLET | Freq: Once | ORAL | Status: AC
  Administered 2019-04-09: 12.5 mg via ORAL
  Filled 2019-04-09 (×2): qty 1

## 2019-04-09 MED ORDER — ICOSAPENT ETHYL 1 G PO CAPS: 2.0000 g | ORAL_CAPSULE | Freq: Two times a day (BID) | ORAL | Status: DC

## 2019-04-09 MED ORDER — METOPROLOL SUCCINATE ER 25 MG PO TB24
25.0000 mg | ORAL_TABLET | Freq: Two times a day (BID) | ORAL | Status: DC
  Administered 2019-04-09 – 2019-04-12 (×6): 25 mg via ORAL
  Filled 2019-04-09 (×6): qty 1

## 2019-04-09 NOTE — Progress Notes (Signed)
Virtual Visit via Video Note   This visit type was conducted due to national recommendations for restrictions regarding the COVID-19 Pandemic (e.g. social distancing) in an effort to limit this patient's exposure and mitigate transmission in our community.  Due to his co-morbid illnesses, this patient is at least at moderate risk for complications without adequate follow up.  This format is felt to be most appropriate for this patient at this time.  All issues noted in this document were discussed and addressed.  A limited physical exam was performed with this format.  Please refer to the patient's chart for his consent to telehealth for [facility].   Date:  04/09/2019   ID:  [name], DOB Sep 08, 1951, MRN [id number]  Patient Location: Home Provider Location: Home  PCP:  Orpah Melter, MD  Cardiologist:  Shelva Majestic, MD  Electrophysiologist:  None   Evaluation Performed:  Follow-Up Visit  Chief Complaint:  SOB, edema  History of Present Illness:    Joseph Hoffman is a 68 y.o. male with a PMH of permanent atrial fibrillation, hypertension, IDDM 2, s/p St Jude mechanical AVR in 1324, combined systolic and diastolic heart failure, obstructive sleep apnea on CPAP, asthmatic COPD, and CRI-3.   Cardiac catheterization obtained 09/19/2012 prior to AVR showed no significant coronary artery disease.  Echocardiogram obtained in May 2018 showed EF 50 to 55%.[name]  He was seen by Rosaria Ferries, PA-C on 01/06/2019, he was volume overloaded at the time.  He admits to fluid indiscretion, it was recommended for him to cut back on salt intake and fluid intake.  Repeat echocardiogram was obtained on 01/08/2019 which showed EF of 40 to 45%, severe biatrial enlargement, mechanical aortic valve present and functioning normally.  He was then seen March 9th in follow up.  His Lasix had been increased to 40 mg BID by this time, he stated if he did not take it twice a day he would retain fluid.  He was instructed  to stay on this dose.  F/U BMP actually showed he might be dehydrated with an increased BUN and SCr.    He was to follow up with Dr Claiborne Billings but called today saying for the past week he has been having increasing DOE, orthopnea, and LE edema despite increasing his Lasix to 120 mg daily.  He tells me he is SOB at rest.  His weight today is 261 lbs, up 9 lbs from 02/09/2019 office visit.  I recommended he go to the ED at [facility] for further evaluation as I did not think further OP adjustments in his medications would help at this point.   The patient does not have symptoms concerning for COVID-19 infection (fever, chills, cough, or new shortness of breath).    Past Medical History:  Diagnosis Date   Anxiety    Aortic stenosis    Arthritis    Asthma    Asymptomatic LV dysfunction, EF 45%, normal coronary arteries on cardiac cath 09/18/12 09/30/2012   CHF (congestive heart failure) (HCC)    Chronic anticoagulation, on Xarelto prior to admit 09/30/2012   DM (diabetes mellitus) ([place]) 09/30/2012   Hepatitis 2003   Hypertension    Myocardial infarction [facility])    Permanent atrial fibrillation, since 1994 09/30/2012   stress test 02/08/12- normal study, no significant ischemia   S/P AVR (aortic valve replacement), 09/30/2012   a. s/p mechcanical AVR in 09/2012 (on Coumadin)   Sleep apnea    uses CPAP   Past Surgical History:  Procedure Laterality Date   ABDOMINAL ANGIOGRAM  09/18/2012   Procedure: ABDOMINAL ANGIOGRAM;  Surgeon: Troy Sine, MD;  Location: [facility] CATH LAB;  Service: Cardiovascular;;   AORTIC VALVE REPLACEMENT  09/29/2012   Procedure: AORTIC VALVE REPLACEMENT (AVR);  Surgeon: Gaye Pollack, MD;  Location: [place];  Service: Open Heart Surgery;  Laterality: N/A;   ARCH AORTOGRAM  09/18/2012   Procedure: ARCH AORTOGRAM;  Surgeon: Troy Sine, MD;  Location: [facility] CATH LAB;  Service: Cardiovascular;;   CARDIAC CATHETERIZATION  09/18/12   severe calcific aortic stenosis, peak  gradient 58mm, mean gradient 35mm, EF 45%   CARDIAC VALVE REPLACEMENT     AVR 09-29-12   CHOLECYSTECTOMY     FRACTURE SURGERY     LEFT AND RIGHT HEART CATHETERIZATION WITH CORONARY ANGIOGRAM N/A 09/18/2012   Procedure: LEFT AND RIGHT HEART CATHETERIZATION WITH CORONARY ANGIOGRAM;  Surgeon: Troy Sine, MD;  Location[facility] CATH LAB;  Service: Cardiovascular;  Laterality: N/A;     Current Meds  Medication Sig   acetaminophen (TYLENOL) 325 MG tablet Take 2 tablets (650 mg total) by mouth every 4 (four) hours as needed for pain or fever.   albuterol (PROVENTIL HFA;VENTOLIN HFA) 108 (90 BASE) MCG/ACT inhaler Inhale 2 puffs into the lungs every 6 (six) hours as needed. For shortness of breath   Ascorbic Acid (VITAMIN C) 1000 MG tablet Take 1,000 mg by mouth daily.   B-D ULTRAFINE III SHORT PEN 31G X 8 MM MISC    cholecalciferol (VITAMIN D) 1000 UNITS tablet Take 1,000 Units by mouth daily.   Chromium 200 MCG CAPS Take 200 mcg by mouth daily.   clotrimazole-betamethasone (LOTRISONE) cream Apply 1 application topically as needed.   digoxin (LANOXIN) 0.125 MG tablet TAKE 1 TABLET EVERY DAY   EASY TOUCH LANCETS 30G/TWIST MISC    empagliflozin (JARDIANCE) 25 MG TABS tablet Take 25 mg by mouth daily.   escitalopram (LEXAPRO) 10 MG tablet Take 10 mg by mouth every evening.    ezetimibe (ZETIA) 10 MG tablet Take 1 tablet (10 mg total) by mouth daily.   fenofibrate (TRICOR) 145 MG tablet Take 1 tablet (145 mg total) by mouth daily.   Fluticasone-Salmeterol (ADVAIR) 250-50 MCG/DOSE AEPB Inhale 1 puff into the lungs daily. For asthma related symptoms   furosemide (LASIX) 40 MG tablet Take 1 tablet (40 mg total) by mouth daily.   glimepiride (AMARYL) 2 MG tablet Take 2 mg by mouth daily with breakfast.    Glucosamine-Chondroit-Vit C-Mn (GLUCOSAMINE 1500 COMPLEX PO) Take 1,500 mg by mouth daily.   Icosapent Ethyl (VASCEPA) 1 g CAPS Take 2 capsules (2 g total) by mouth 2 (two) times  daily.   insulin glargine (LANTUS) 100 UNIT/ML injection Inject 5-20 Units into the skin 2 (two) times daily. 5-20 units at breakfast and dinner depending on blood sugar levels   Liraglutide (VICTOZA ) Inject 1.8 mg into the skin 1 day or 1 dose.   metFORMIN (GLUCOPHAGE-XR) 500 MG 24 hr tablet Take 2,000 mg by mouth at bedtime.   metoprolol succinate (TOPROL XL) 25 MG 24 hr tablet Take 1 tablet (25 mg total) by mouth daily.   Multiple Vitamins-Minerals (CENTRUM SPECIALIST HEART PO) Take 1 tablet by mouth 2 (two) times daily.   warfarin (COUMADIN) 5 MG tablet Take as instructed with 10 mg.     Allergies:   Iohexol; Niacin and related; and Penicillins   Social History   Tobacco Use   Smoking status: Former Smoker  Packs/day: 0.25    Years: 10.00    Pack years: 2.50    Types: Cigarettes    Last attempt to quit: 09/25/1982    Years since quitting: 36.5   Smokeless tobacco: Former Systems developer  Substance Use Topics   Alcohol use: Yes    Comment: occ. beer/wine   Drug use: No     Family Hx: The patient's family history includes Diabetes in his father; Heart attack (age of onset: 19) in his brother; Hyperlipidemia (age of onset: 5) in his brother; Hypertension in his mother.  ROS:   Please see the history of present illness.    All other systems reviewed and are negative.   Prior CV studies:   The following studies were reviewed today: Echo-01/08/2019   Labs/Other Tests and Data Reviewed:    EKG:  An ECG dated 01/06/2019 was personally reviewed today and demonstrated:  AF with VR 72, PVCs  Recent Labs: 02/05/2019: ALT 34 03/13/2019: BUN 42; Creatinine, Ser 1.83; Potassium 5.0; Sodium 139   Recent Lipid Panel Lab Results  Component Value Date/Time   CHOL 156 02/05/2019 10:06 AM   CHOL 166 08/23/2014 08:53 AM   TRIG 247 (H) 02/05/2019 10:06 AM   TRIG 274 (H) 08/23/2014 08:53 AM   HDL 23 (L) 02/05/2019 10:06 AM   HDL 26 (L) 08/23/2014 08:53 AM   CHOLHDL 6.8 (H)  02/05/2019 10:06 AM   CHOLHDL 4.6 08/24/2013 10:23 AM   LDLCALC 84 02/05/2019 10:06 AM   LDLCALC 85 08/23/2014 08:53 AM    Wt Readings from Last 3 Encounters:  04/09/19 261 lb (118.4 kg)  02/09/19 252 lb (114.3 kg)  01/13/19 259 lb (117.5 kg)     Objective:    Vital Signs:  BP 112/78    Pulse 73    Ht 5\' 11"  (1.803 m)    Wt 261 lb (118.4 kg)    BMI 36.40 kg/m    VITAL SIGNS:  reviewed  Pt complained of SOB at rest but was in no acute distress on the phone  ASSESSMENT & PLAN:    Acute on chronic combined CHF- Pt advised to go to the ED as OP adjustment if his medications were not effective  NICM- EF 40-45% by echo 01/08/2019  H/O St Jude AVR 2013- Moderate AR on echo 01/2019  Chronic AF- Rate controlled  Asthmatic COPD- No improvement with inhalers at home, he did not appear to be actively wheezing during our interview today.   CRI-3 Last SCr 03/13/2019 was 1.83 -BUN 43  IDDM-   COVID-19 Education: The signs and symptoms of COVID-19 were discussed with the patient and how to seek care for testing (follow up with PCP or arrange E-visit).  The importance of social distancing was discussed today.  Time:   Today, I have spent 15 minutes with the patient with telehealth technology discussing the above problems.     Medication Adjustments/Labs and Tests Ordered: Current medicines are reviewed at length with the patient today.  Concerns regarding medicines are outlined above.   Tests Ordered: No orders of the defined types were placed in this encounter.   Medication Changes: No orders of the defined types were placed in this encounter.   Disposition:  Follow up I recomended the patient go to [facility] ED for further evaluation.  We have notified triage and Rica Mote, PA-C  04/09/2019 11:25 AM    [place]

## 2019-04-09 NOTE — Telephone Encounter (Signed)
Patient gave permission to do a virtual visit with Kerin Ransom PA today.

## 2019-04-09 NOTE — ED Provider Notes (Signed)
[facility] EMERGENCY DEPARTMENT Provider Note   CSN: [id number] Arrival date & time: 04/09/19  1235    History   Chief Complaint Chief Complaint  Patient presents with  . Congestive Heart Failure  . Leg Swelling    HPI Joseph Hoffman is a 68 y.o. male with a past medical history of CHF EF of 40 to 45% on echo done this year, atrial fibrillation, s/p AVR, asthma,, stage III CKD, presents to ED for shortness of breath, orthopnea, leg swelling and weight gain for the past 5 days.  He has gained 9 pounds since his symptoms began.  He has been gradually increasing his dose of Lasix to help with the swelling.  His daily dose as prescribed is 40 mg.  He has taken 60 mg, then 80 mg, then 120 mg with no improvement in his swelling.  He was evaluated by cardiology PA this morning and was sent to the ED for further evaluation and lab work.  He states that this feels similar to his prior CHF exacerbations.  He increase his medication and attempt to avoid coming to the ED.  He denies any chest pain, cough, fever, hemoptysis, injuries or falls.  He does not wear supplemental oxygen at home.     HPI  Past Medical History:  Diagnosis Date  . Anxiety   . Aortic stenosis   . Arthritis   . Asthma   . Asymptomatic LV dysfunction, EF 45%, normal coronary arteries on cardiac cath 09/18/12 09/30/2012  . CHF (congestive heart failure) ([place])   . Chronic anticoagulation, on Xarelto prior to admit 09/30/2012  . DM (diabetes mellitus) ([place]) 09/30/2012  . Hepatitis 2003  . Hypertension   . Myocardial infarction ([place])   . Permanent atrial fibrillation, since 1994 09/30/2012   stress test 02/08/12- normal study, no significant ischemia  . S/P AVR (aortic valve replacement), 09/30/2012   a. s/p mechcanical AVR in 09/2012 (on Coumadin)  . Sleep apnea    uses CPAP    Patient Active Problem List   Diagnosis Date Noted  . CRI (chronic renal insufficiency), stage 3 (moderate) ([place])  04/09/2019  . Essential hypertension 06/03/2017  . Dyslipidemia, goal LDL below 70 04/11/2017  . AS (aortic stenosis) 10/28/2012  . Acute on chronic systolic heart failure, re-admitted 10/12/12 10/12/2012  . Nonischemic cardiomyopathy, EF 40-45% 10/12/2012  . S/P AVR, 09/29/12, St. Jude. (discharged 10/05/12) 09/30/2012  . Permanent atrial fibrillation, since 1994 09/30/2012  . Type 2 IDDM 09/30/2012  . Sleep apnea, on C-pap 09/30/2012  . Normal coronary arteries at cath Oct 2013 09/30/2012  . Chronic anticoagulation, (on Xarelto prior to admit for CAF) now on Coumadin after St Jude AVR 09/30/2012  . Severe aortic stenosis 09/25/2012    Past Surgical History:  Procedure Laterality Date  . ABDOMINAL ANGIOGRAM  09/18/2012   Procedure: ABDOMINAL ANGIOGRAM;  Surgeon: Troy Sine, MD;  Location: [facility] CATH LAB;  Service: Cardiovascular;;  . AORTIC VALVE REPLACEMENT  09/29/2012   Procedure: AORTIC VALVE REPLACEMENT (AVR);  Surgeon: Gaye Pollack, MD;  Location: [place];  Service: Open Heart Surgery;  Laterality: N/A;  . ARCH AORTOGRAM  09/18/2012   Procedure: ARCH AORTOGRAM;  Surgeon: Troy Sine, MD;  Location: [facility] CATH LAB;  Service: Cardiovascular;;  . CARDIAC CATHETERIZATION  09/18/12   severe calcific aortic stenosis, peak gradient 53mm, mean gradient 99mm, EF 45%  . CARDIAC VALVE REPLACEMENT     AVR 09-29-12  . CHOLECYSTECTOMY    .  FRACTURE SURGERY    . LEFT AND RIGHT HEART CATHETERIZATION WITH CORONARY ANGIOGRAM N/A 09/18/2012   Procedure: LEFT AND RIGHT HEART CATHETERIZATION WITH CORONARY ANGIOGRAM;  Surgeon: Troy Sine, MD;  Location: [facility] CATH LAB;  Service: Cardiovascular;  Laterality: N/A;        Home Medications    Prior to Admission medications   Medication Sig Start Date End Date Taking? Authorizing Provider  acetaminophen (TYLENOL) 325 MG tablet Take 2 tablets (650 mg total) by mouth every 4 (four) hours as needed for pain or fever. 10/16/12  Yes Kilroy, Luke K,  PA-C  albuterol (PROVENTIL HFA;VENTOLIN HFA) 108 (90 BASE) MCG/ACT inhaler Inhale 2 puffs into the lungs every 6 (six) hours as needed. For shortness of breath   Yes [provider]  Ascorbic Acid (VITAMIN C) 1000 MG tablet Take 1,000 mg by mouth at bedtime.    Yes [provider]  cholecalciferol (VITAMIN D) 1000 UNITS tablet Take 1,000 Units by mouth at bedtime.    Yes [provider]  Chromium 200 MCG CAPS Take 200 mcg by mouth at bedtime.    Yes [provider]  clotrimazole-betamethasone (LOTRISONE) cream Apply 1 application topically as needed. 05/21/14  Yes [provider]  digoxin (LANOXIN) 0.125 MG tablet TAKE 1 TABLET EVERY DAY Patient taking differently: Take 0.125 mg by mouth at bedtime.  01/21/19  Yes Troy Sine, MD  docusate sodium (COLACE) 100 MG capsule Take 200 mg by mouth at bedtime.   Yes [provider]  empagliflozin (JARDIANCE) 25 MG TABS tablet Take 25 mg by mouth daily. Patient taking differently: Take 25 mg by mouth at bedtime.  12/11/18  Yes Troy Sine, MD  escitalopram (LEXAPRO) 10 MG tablet Take 10 mg by mouth every evening.    Yes [provider]  ezetimibe (ZETIA) 10 MG tablet Take 1 tablet (10 mg total) by mouth daily. Patient taking differently: Take 10 mg by mouth at bedtime.  02/09/19 05/10/19 Yes Almyra Deforest, PA  fenofibrate (TRICOR) 145 MG tablet Take 1 tablet (145 mg total) by mouth daily. 02/09/19  Yes Almyra Deforest, PA  Fluticasone-Salmeterol (ADVAIR) 250-50 MCG/DOSE AEPB Inhale 1 puff into the lungs as needed (shortness of breath). For asthma related symptoms   Yes [provider]  furosemide (LASIX) 40 MG tablet Take 1 tablet (40 mg total) by mouth daily. 04/09/19  Yes Troy Sine, MD  glimepiride (AMARYL) 2 MG tablet Take 2 mg by mouth daily with breakfast.  05/25/17  Yes [provider]  Glucosamine-Chondroit-Vit C-Mn (GLUCOSAMINE 1500 COMPLEX PO) Take 1,500 mg by mouth at bedtime.     Yes [provider]  Icosapent Ethyl (VASCEPA) 1 g CAPS Take 2 capsules (2 g total) by mouth 2 (two) times daily. 04/21/18  Yes Troy Sine, MD  insulin glargine (LANTUS) 100 UNIT/ML injection Inject 45 Units into the skin 2 (two) times daily.    Yes [provider]  Liraglutide (VICTOZA [place]) Inject 1.8 mg into the skin at bedtime.    Yes [provider]  metFORMIN (GLUCOPHAGE-XR) 500 MG 24 hr tablet Take 2,000 mg by mouth at bedtime.   Yes [provider]  metoprolol succinate (TOPROL XL) 25 MG 24 hr tablet Take 1 tablet (25 mg total) by mouth daily. Patient taking differently: Take 25 mg by mouth 2 (two) times a day.  12/11/18  Yes Troy Sine, MD  Multiple Vitamins-Minerals (CENTRUM SPECIALIST HEART PO) Take 1 tablet by mouth  2 (two) times daily.   Yes [provider]  omega-3 acid ethyl esters (LOVAZA) 1 g capsule Take 1 g by mouth daily.   Yes [provider]  warfarin (COUMADIN) 10 MG tablet Take 10 mg by mouth See admin instructions. Take Sun. Mon. Wed.Ludwig Clarks   Yes [provider]  warfarin (COUMADIN) 5 MG tablet Take as instructed with 10 mg. Patient taking differently: Take 2.5 mg by mouth See admin instructions. Take with 10 mg to get a total of 12.5 for Tues, Thurs and Saturday 12/11/18  Yes Troy Sine, MD  B-D ULTRAFINE III SHORT PEN 31G X 8 MM MISC  10/04/13   [provider]  EASY TOUCH LANCETS 30G/TWIST [place]  11/05/13   [provider]    Family History Family History  Problem Relation Age of Onset  . Hypertension Mother   . Diabetes Father   . Heart attack Brother 4  . Hyperlipidemia Brother 8       stents placed    Social History Social History   Tobacco Use  . Smoking status: Former Smoker    Packs/day: 0.25    Years: 10.00    Pack years: 2.50    Types: Cigarettes    Last attempt to quit: 09/25/1982    Years since quitting: 36.5  . Smokeless tobacco: Former Network engineer  Use Topics  . Alcohol use: Yes    Comment: occ. beer/wine  . Drug use: No     Allergies   Iohexol; Niacin and related; and Penicillins   Review of Systems Review of Systems  Constitutional: Negative for appetite change, chills and fever.  HENT: Negative for ear pain, rhinorrhea, sneezing and sore throat.   Eyes: Negative for photophobia and visual disturbance.  Respiratory: Positive for shortness of breath. Negative for cough, chest tightness and wheezing.   Cardiovascular: Positive for leg swelling. Negative for chest pain and palpitations.  Gastrointestinal: Negative for abdominal pain, blood in stool, constipation, diarrhea, nausea and vomiting.  Genitourinary: Negative for dysuria, hematuria and urgency.  Musculoskeletal: Negative for myalgias.  Skin: Negative for rash.  Neurological: Negative for dizziness, weakness and light-headedness.     Physical Exam Updated Vital Signs BP (!) 144/87   Pulse 72   Temp (!) 97.5 F (36.4 C) (Oral)   Resp 19   Ht 5\' 11"  (1.803 m)   Wt 118.4 kg   SpO2 94%   BMI 36.40 kg/m   Physical Exam Vitals signs and nursing note reviewed.  Constitutional:      General: He is not in acute distress.    Appearance: He is well-developed. He is obese.     Comments: Speaking in complete sentences without difficulty.  HENT:     Head: Normocephalic and atraumatic.     Nose: Nose normal.  Eyes:     General: No scleral icterus.       Left eye: No discharge.     Conjunctiva/sclera: Conjunctivae normal.  Neck:     Musculoskeletal: Normal range of motion and neck supple.  Cardiovascular:     Rate and Rhythm: Normal rate and regular rhythm.     Heart sounds: Normal heart sounds. No murmur. No friction rub. No gallop.   Pulmonary:     Effort: Pulmonary effort is normal. No respiratory distress.     Breath sounds: Normal breath sounds.  Abdominal:     General: Bowel sounds are normal. There is no distension.     Palpations: Abdomen is  soft.      Tenderness: There is no abdominal tenderness. There is no guarding.  Musculoskeletal: Normal range of motion.     Right lower leg: Edema present.     Left lower leg: Edema present.     Comments: Trace pitting edema in BLE.  Skin:    General: Skin is warm and dry.     Findings: No rash.  Neurological:     Mental Status: He is alert.     Motor: No abnormal muscle tone.     Coordination: Coordination normal.      ED Treatments / Results  Labs (all labs ordered are listed, but only abnormal results are displayed) Labs Reviewed  BASIC METABOLIC PANEL - Abnormal; Notable for the following components:      Result Value   BUN 39 (*)    Creatinine, Ser 1.72 (*)    GFR calc non Af Amer 40 (*)    GFR calc Af Amer 46 (*)    All other components within normal limits  CBC - Abnormal; Notable for the following components:   RDW 16.9 (*)    All other components within normal limits  BRAIN NATRIURETIC PEPTIDE - Abnormal; Notable for the following components:   B Natriuretic Peptide 222.9 (*)    All other components within normal limits    EKG EKG Interpretation  Date/Time:  Thursday Apr 09 2019 13:02:10 EDT Ventricular Rate:  72 PR Interval:    QRS Duration: 142 QT Interval:  458 QTC Calculation: 501 R Axis:   -101 Text Interpretation:  Atrial fibrillation Non-specific intra-ventricular conduction block Possible Anterolateral infarct , age undetermined Abnormal ECG Confirmed by Lennice Sites [phone]) on 04/09/2019 2:00:15 PM   Radiology Dg Chest 2 View  Result Date: 04/09/2019 CLINICAL DATA:  Weight gain and bilateral lower extremity swelling. EXAM: CHEST - 2 VIEW COMPARISON:  Chest x-ray dated August 20, 2016. FINDINGS: Increased mild cardiomegaly. Prior aortic valve replacement. Normal mediastinal contours. Mild pulmonary vascular congestion. New small right pleural effusion. No consolidation or pneumothorax. No acute osseous abnormality. IMPRESSION: 1. Increased mild  cardiomegaly. Mild pulmonary vascular congestion and small right pleural effusion. Electronically Signed   By: Titus Dubin M.D.   On: 04/09/2019 14:12    Procedures Procedures (including critical care time)  Medications Ordered in ED Medications  sodium chloride flush (NS) 0.9 % injection 3 mL (has no administration in time range)     Initial Impression / Assessment and Plan / ED Course  I have reviewed the triage vital signs and the nursing notes.  Pertinent labs & imaging results that were available during my care of the patient were reviewed by me and considered in my medical decision making (see chart for details).        68 year old male with past medical history of CHF with last EF of 45%, status post AVR, presents to ED for dyspnea, orthopnea, leg swelling and weight gain consistent with CHF exacerbation.  He has been increasing his dose of Lasix and attempt to prevent coming to the ED for evaluation.  He was evaluated by cardiology PA this morning via telehealth and advised to come to the ED as his outpatient medication is not helping to improve the swelling.  He normally takes 40 mg of Lasix every day with last dose being this morning.  He does not wear home supplemental oxygen.  On my exam there is pitting edema noted to lower extremities.  Patient speaking in complete sentences but does note  that he has increased work of breathing with ambulation.  Chest x-ray shows vascular congestion and cardiomegaly.  BNP mildly elevated to 222.  Creatinine of 1.72 which is similar to baseline and consistent with his CKD.  EKG shows no changes from prior.  I have consulted cardiology to evaluate the patient for any further medication recommendations and disposition.  Final Clinical Impressions(s) / ED Diagnoses   Final diagnoses:  Acute on chronic congestive heart failure, unspecified heart failure type [facility])    ED Discharge Orders    None       Delia Heady, PA-C 04/09/19 1511     Lennice Sites, DO 04/09/19 1627

## 2019-04-09 NOTE — Telephone Encounter (Signed)
New Message           Patient is calling to speak to a nurse about his CHF, patient states the lasix is not helping him and he is gaining weight.

## 2019-04-09 NOTE — Progress Notes (Signed)
ANTICOAGULATION CONSULT NOTE - Initial Consult  Pharmacy Consult for Warfarin Indication: Atrial fibrillation and mechanical valve  Allergies  Allergen Reactions  . Iohexol Anaphylaxis  . Niacin And Related     Flushing with immediate realese  . Penicillins Other (See Comments)    Unknown.[name]  Did it involve swelling of the face/tongue/throat, SOB, or low BP? Unknown Did it involve sudden or severe rash/hives, skin peeling, or any reaction on the inside of your mouth or nose? Unknown Did you need to seek medical attention at a hospital or doctor's office? Unknown When did it last happen?Childhood If all above answers are "NO", may proceed with cephalosporin use.    Vital Signs: Temp: 97.5 F (36.4 C) (05/07 1244) Temp Source: Oral (05/07 1244) BP: 144/87 (05/07 1445) Pulse Rate: 72 (05/07 1445)  Labs: Recent Labs    04/09/19 1316 04/09/19 1536  HGB 14.1  --   HCT 44.0  --   PLT 202  --   LABPROT  --  26.8*  INR  --  2.5*  CREATININE 1.72*  --     Estimated Creatinine Clearance: 53 mL/min (A) (by C-G formula based on SCr of 1.72 mg/dL (H)).   Medical History: Past Medical History:  Diagnosis Date  . Anxiety   . Aortic stenosis   . Arthritis   . Asthma   . Asymptomatic LV dysfunction, EF 45%, normal coronary arteries on cardiac cath 09/18/12 09/30/2012  . CHF (congestive heart failure) ([place])   . Chronic anticoagulation, on Xarelto prior to admit 09/30/2012  . DM (diabetes mellitus) ([place]) 09/30/2012  . Hepatitis 2003  . Hypertension   . Myocardial infarction ([place])   . Permanent atrial fibrillation, since 1994 09/30/2012   stress test 02/08/12- normal study, no significant ischemia  . S/P AVR (aortic valve replacement), 09/30/2012   a. s/p mechcanical AVR in 09/2012 (on Coumadin)  . Sleep apnea    uses CPAP    Assessment: Pt is a 51 yoM with atrial fibrillation and sp mechanical AVR (2013). He is on warfarin PTA. Home dose  warfarin 10 mg on M/W/F/Su and 12.5 on Tu/Th/Sat.   INR therapeutic at 2.5. No signs/symptoms of bleeding. CBC WNL  Goal of Therapy:  INR 2.5-3.5     Plan:  Warfarin 12.5 mg x1, per home dose Daily INR/CBC   Claiborne Billings, PharmD PGY2 Cardiology Pharmacy Resident Please check AMION for all Pharmacist numbers by unit 04/09/2019 5:02 PM

## 2019-04-09 NOTE — Patient Instructions (Addendum)
PLEASE GO TO THE EMERGENCY DEPARTMENT AT [facility]. 

## 2019-04-09 NOTE — ED Notes (Signed)
Pt. Given orange juice and Graham crackers

## 2019-04-09 NOTE — Telephone Encounter (Signed)
Returned call to patient.Spoke to wife patient taking a shower he gave permission to speak to her.Wife stated for the past 2 weeks he has been sob.Stated sob gets better during the day ,but when he lays down at night sob worse.Stated he is having swelling in left ankle.He has gained 9 lbs in 1 week.He was taking Lasix 40 mg daily he increased dose to 40 mg twice a day for the past 3 days..Stated even with the increase of Lasix he is still sob.Virtual appointment scheduled with Kerin Ransom PA this morning at 11:00 am.

## 2019-04-09 NOTE — ED Triage Notes (Signed)
Onset 1 weeks ago developed weight gain and bilateral lower extremity edema worsening overtime. Spoke with cardiology today sent to the ED for evaluation.

## 2019-04-09 NOTE — Telephone Encounter (Signed)
Contacted patient and spoke with his Wife. Informed her of Luke's recommendations. She voiced understanding.

## 2019-04-09 NOTE — Telephone Encounter (Signed)
Noted, thanks!

## 2019-04-09 NOTE — Progress Notes (Signed)
Pt requests CPAP at night per home regimen.  Dr. Kalman Shan, cardiology paged and requested order.

## 2019-04-09 NOTE — H&P (Signed)
History and Physical Consultation:   Patient ID: Joseph Hoffman MRN: [id number]; DOB: 1951-10-26  Admit date: 04/09/2019 Date of Consult: 04/09/2019  Primary Care Provider: Orpah Melter, MD Primary Cardiologist: Shelva Majestic, MD  Primary Electrophysiologist:  None    Patient Profile:   Joseph Hoffman is a 68 y.o. male with a hx of permanent a fib, HTN, IDDM-2, s/p St Jude mechanical AVR in 7096, combined systolic and diastolic HF, OSA on CPAP, asthmatic COPD and CRI-3 who is being seen today for the evaluation of acute CHF at the request of Dr. Ronnald Nian.  History of Present Illness:   Mr. Bynum with above hx and cath 09/2012 prior to AVR and no significant CAD, echo 04/2017 with EF 50-55%.  In Feb 2020 he was volume overloaded with fluid indiscretion, echo 01/2019 with EF 40-45% severe biatrial enlargement, mechanical AV with moderate AR, and mean gradient on mechanical aortic valve has decreased from 26 mmHg to 16 mmhg-mildly elevated.   Lasix increased to 40 mg BID.  Today he had telehealth visit with increasing DOE, orthopnea and lower ext edema and increased his lasix to 120 mg daily.  He admitted to be SOB at rest.  Wt is up 9 lbs from March.   He was instructed to go to ER as outpt med changes not helping.    He is having to sit up at night due to SOB.  Lt ankle swollen.  No chest pain.   He was on lasix 40 mg then he went up to 60 mg then 80 mg daily  And yesterday 120 mg without help.  He does use inhalers because he does no how much is asthma or fluid.  No colds or fevers.        EKG:  The EKG was personally reviewed and demonstrates:  A fib at 70 no acute changes from Feb.   BNP 222,   Na 138 K+ 4.1 BUN 39 Cr 1.72 Hgb 14.1, Hct 44 plts 202   CXR 2 V:  Increased mild cardiomegaly. Mild pulmonary vascular congestion and small right pleural effusion.  Past Medical History:  Diagnosis Date  . Anxiety   . Aortic stenosis   . Arthritis   . Asthma   . Asymptomatic LV  dysfunction, EF 45%, normal coronary arteries on cardiac cath 09/18/12 09/30/2012  . CHF (congestive heart failure) ([place])   . Chronic anticoagulation, on Xarelto prior to admit 09/30/2012  . DM (diabetes mellitus) ([place]) 09/30/2012  . Hepatitis 2003  . Hypertension   . Myocardial infarction ([place])   . Permanent atrial fibrillation, since 1994 09/30/2012   stress test 02/08/12- normal study, no significant ischemia  . S/P AVR (aortic valve replacement), 09/30/2012   a. s/p mechcanical AVR in 09/2012 (on Coumadin)  . Sleep apnea    uses CPAP    Past Surgical History:  Procedure Laterality Date  . ABDOMINAL ANGIOGRAM  09/18/2012   Procedure: ABDOMINAL ANGIOGRAM;  Surgeon: Troy Sine, MD;  Location: [facility] C[facility] LAB;  Service: Cardiovascular;;  . AORTIC VALVE REPLACEMENT  09/29/2012   Procedure: AORTIC VALVE REPLACEMENT (AVR);  Surgeon: Gaye Pollack, MD;  Location: [place];  Service: Open Heart Surgery;  Laterality: N/A;  . ARCH AORTOGRAM  09/18/2012   Procedure: ARCH AORTOGRAM;  Surgeon: Troy Sine, MD;  Location: [facility] CATH LAB;  Service: Cardiovascular;;  . CARDIAC CATHETERIZATION  09/18/12   severe calcific aortic stenosis, peak gradient 70mm, mean gradient 34mm, EF 45%  . CARDIAC VALVE  REPLACEMENT     AVR 09-29-12  . CHOLECYSTECTOMY    . FRACTURE SURGERY    . LEFT AND RIGHT HEART CATHETERIZATION WITH CORONARY ANGIOGRAM N/A 09/18/2012   Procedure: LEFT AND RIGHT HEART CATHETERIZATION WITH CORONARY ANGIOGRAM;  Surgeon: Troy Sine, MD;  Location: [facility] CATH LAB;  Service: Cardiovascular;  Laterality: N/A;     Home Medications:  Prior to Admission medications   Medication Sig Start Date End Date Taking? Authorizing Provider  acetaminophen (TYLENOL) 325 MG tablet Take 2 tablets (650 mg total) by mouth every 4 (four) hours as needed for pain or fever. 10/16/12  Yes Kilroy, Luke K, PA-C  albuterol (PROVENTIL HFA;VENTOLIN HFA) 108 (90 BASE) MCG/ACT inhaler Inhale 2 puffs into the lungs  every 6 (six) hours as needed. For shortness of breath   Yes [provider]  Ascorbic Acid (VITAMIN C) 1000 MG tablet Take 1,000 mg by mouth at bedtime.    Yes [provider]  cholecalciferol (VITAMIN D) 1000 UNITS tablet Take 1,000 Units by mouth at bedtime.    Yes [provider]  Chromium 200 MCG CAPS Take 200 mcg by mouth at bedtime.    Yes [provider]  clotrimazole-betamethasone (LOTRISONE) cream Apply 1 application topically as needed. 05/21/14  Yes [provider]  digoxin (LANOXIN) 0.125 MG tablet TAKE 1 TABLET EVERY DAY Patient taking differently: Take 0.125 mg by mouth at bedtime.  01/21/19  Yes Troy Sine, MD  docusate sodium (COLACE) 100 MG capsule Take 200 mg by mouth at bedtime.   Yes [provider]  empagliflozin (JARDIANCE) 25 MG TABS tablet Take 25 mg by mouth daily. Patient taking differently: Take 25 mg by mouth at bedtime.  12/11/18  Yes Troy Sine, MD  escitalopram (LEXAPRO) 10 MG tablet Take 10 mg by mouth every evening.    Yes [provider]  ezetimibe (ZETIA) 10 MG tablet Take 1 tablet (10 mg total) by mouth daily. Patient taking differently: Take 10 mg by mouth at bedtime.  02/09/19 05/10/19 Yes Almyra Deforest, PA  fenofibrate (TRICOR) 145 MG tablet Take 1 tablet (145 mg total) by mouth daily. 02/09/19  Yes Almyra Deforest, PA  Fluticasone-Salmeterol (ADVAIR) 250-50 MCG/DOSE AEPB Inhale 1 puff into the lungs as needed (shortness of breath). For asthma related symptoms   Yes [provider]  furosemide (LASIX) 40 MG tablet Take 1 tablet (40 mg total) by mouth daily. 04/09/19  Yes Troy Sine, MD  glimepiride (AMARYL) 2 MG tablet Take 2 mg by mouth daily with breakfast.  05/25/17  Yes [provider]  Glucosamine-Chondroit-Vit C-Mn (GLUCOSAMINE 1500 COMPLEX PO) Take 1,500 mg by mouth at bedtime.    Yes [provider]  Icosapent Ethyl (VASCEPA) 1 g CAPS Take 2 capsules (2 g total) by mouth  2 (two) times daily. 04/21/18  Yes Troy Sine, MD  insulin glargine (LANTUS) 100 UNIT/ML injection Inject 45 Units into the skin 2 (two) times daily.    Yes [provider]  Liraglutide (VICTOZA [place]) Inject 1.8 mg into the skin at bedtime.    Yes [provider]  metFORMIN (GLUCOPHAGE-XR) 500 MG 24 hr tablet Take 2,000 mg by mouth at bedtime.   Yes [provider]  metoprolol succinate (TOPROL XL) 25 MG 24 hr tablet Take 1 tablet (25 mg total) by mouth daily. Patient taking differently: Take 25 mg by mouth 2 (two) times a day.  12/11/18  Yes Troy Sine, MD  Multiple Vitamins-Minerals (  CENTRUM SPECIALIST HEART PO) Take 1 tablet by mouth 2 (two) times daily.   Yes [provider]  omega-3 acid ethyl esters (LOVAZA) 1 g capsule Take 1 g by mouth daily.   Yes [provider]  warfarin (COUMADIN) 10 MG tablet Take 10 mg by mouth See admin instructions. Take Sun. Mon. Wed.Ludwig Clarks   Yes [provider]  warfarin (COUMADIN) 5 MG tablet Take as instructed with 10 mg. Patient taking differently: Take 2.5 mg by mouth See admin instructions. Take with 10 mg to get a total of 12.5 for Tues, Thurs and Saturday 12/11/18  Yes Troy Sine, MD  B-D ULTRAFINE III SHORT PEN 31G X 8 MM MISC  10/04/13   [provider]  EASY TOUCH LANCETS 30G/TWIST [place]  11/05/13   [provider]    Inpatient Medications: Scheduled Meds: . sodium chloride flush  3 mL Intravenous Once   Continuous Infusions:  PRN Meds:   Allergies:    Allergies  Allergen Reactions  . Iohexol Anaphylaxis  . Niacin And Related     Flushing with immediate realese  . Penicillins Other (See Comments)    Unknown.[name]  Did it involve swelling of the face/tongue/throat, SOB, or low BP? Unknown Did it involve sudden or severe rash/hives, skin peeling, or any reaction on the inside of your mouth or nose? Unknown Did you need to seek medical attention  at a hospital or doctor's office? Unknown When did it last happen?Childhood If all above answers are "NO", may proceed with cephalosporin use.    Social History:   Social History   Socioeconomic History  . Marital status: Married    Spouse name: [name]  . Number of children: 3  . Years of education: 15+  . Highest education level: Not on file  Occupational History    Employer: [employer]  . Financial resource strain: Not on file  . Food insecurity:    Worry: Not on file    Inability: Not on file  . Transportation needs:    Medical: Not on file    Non-medical: Not on file  Tobacco Use  . Smoking status: Former Smoker    Packs/day: 0.25    Years: 10.00    Pack years: 2.50    Types: Cigarettes    Last attempt to quit: 09/25/1982    Years since quitting: 36.5  . Smokeless tobacco: Former Network engineer and Sexual Activity  . Alcohol use: Yes    Comment: occ. beer/wine  . Drug use: No  . Sexual activity: Yes    Birth control/protection: None  Lifestyle  . Physical activity:    Days per week: Not on file    Minutes per session: Not on file  . Stress: Not on file  Relationships  . Social connections:    Talks on phone: Not on file    Gets together: Not on file    Attends religious service: Not on file    Active member of club or organization: Not on file    Attends meetings of clubs or organizations: Not on file    Relationship status: Not on file  . Intimate partner violence:    Fear of current or ex partner: Not on file    Emotionally abused: Not on file    Physically abused: Not on file    Forced sexual activity: Not on file  Other Topics Concern  . Not on file  Social History  Narrative   Patient is married [name]) and lives with his wife and son.   Patient has three children.   Patient is working full-time.   Patient has a college education.   Patient is right handed.   Patient drinks about 2-3 cups of coffee daily.    Family  History:    Family History  Problem Relation Age of Onset  . Hypertension Mother   . Diabetes Father   . Heart attack Brother 64  . Hyperlipidemia Brother 60       stents placed     ROS:  Please see the history of present illness.  General:no colds or fevers, no weight changes Skin:no rashes or ulcers HEENT:no blurred vision, no congestion CV:see HPI PUL:see HPI GI:no diarrhea constipation or melena, no indigestion GU:no hematuria, no dysuria MS:no joint pain, no claudication Neuro:no syncope, no lightheadedness Endo:+ diabetes, no thyroid disease  All other ROS reviewed and negative.     Physical Exam/Data:   Vitals:   04/09/19 1244 04/09/19 1254  BP: 139/67   Pulse: 64   Resp: 17   Temp: (!) 97.5 F (36.4 C)   TempSrc: Oral   SpO2: 94%   Weight:  118.4 kg  Height:  5\' 11"  (1.803 m)   No intake or output data in the 24 hours ending 04/09/19 1423 Last 3 Weights 04/09/2019 04/09/2019 02/09/2019  Weight (lbs) 261 lb 261 lb 252 lb  Weight (kg) 118.389 kg 118.389 kg 114.306 kg     Body mass index is 36.4 kg/m.  Per Dr. Haroldine Laws.  - see below    Relevant CV Studies: Echo 01/08/19 IMPRESSIONS    1. The left ventricle has mild-moderately reduced systolic function of 76-19%. The cavity size was moderately increased. There is no increased left ventricular wall thickness. Echo evidence of pseudonormalization in diastolic relaxation Elevated left  ventricular end-diastolic pressure Left ventrical global hypokinesis without regional wall motion abnormalities.  2. Left atrial size was severely dilated.  3. Right atrial size was severely dilated.  4. The mitral valve is normal in structure. There is moderate mitral annular calcification present. mild to moderately stenosed mitral valve stenosis.  5. The tricuspid valve is normal in structure.  6. A St. Jude mechanical bioprosthesis is present. Aortic valve regurgitation is moderate by color flow Doppler.  7. Compared with  the echo 04/2018, mechanical aortic valve mean gradient has decreased from 26 mmHg to 16 mmHg, which is mildly elevated.  8. The pulmonic valve was normal in structure.  9. There is mild dilatation of the ascending aorta. 10. The inferior vena cava was dilated in size with <50% respiratory variability.  FINDINGS  Left Ventricle: The left ventricle has mild-moderately reduced systolic function of 50-93%. The cavity size was moderately increased. There is no increased left ventricular wall thickness. Echo evidence of pseudonormalization in diastolic relaxation  Elevated left ventricular end-diastolic pressure Left ventrical global hypokinesis without regional wall motion abnormalities. Definity contrast agent was given IV to delineate the left ventricular endocardial borders. Left Atrium: left atrial size was severely dilated Right Atrium: right atrial size was severely dilated Interatrial Septum: No atrial level shunt detected by color flow Doppler.  Pericardium: There is no evidence of pericardial effusion. Mitral Valve: The mitral valve is normal in structure. There is moderate mitral annular calcification present. Mitral valve regurgitation is mild by color flow Doppler. mild to moderately stenosed mitral valve stenosis. Restriction of the posterior  mitral valve leaflet is noted. Mitral stenosis is mild  by PHT and moderate by mean gradient. Tricuspid Valve: The tricuspid valve is normal in structure. Tricuspid valve regurgitation was not visualized by color flow Doppler. Aortic Valve: A St. Jude mechanical bioprosthesis is present. Aortic valve regurgitation is moderate by color flow Doppler. Compared with the echo 04/2018, mechanical aortic valve mean gradient has decreased from 26 mmHg to 16 mmHg, which is mildly  elevated. Pulmonic Valve: The pulmonic valve was normal in structure. Pulmonic valve regurgitation is not visualized by color flow Doppler. Aorta: There is mild dilatation of the  ascending aorta measuring 37 mm. Venous: The inferior vena cava is dilated in size with less than 50% respiratory variability.   Echo 04/03/18 Study Conclusions  - Left ventricle: The cavity size was mildly dilated. Wall   thickness was increased in a pattern of mild LVH. Systolic   function was normal. The estimated ejection fraction was in the   range of 50% to 55%. Wall motion was normal; there were no   regional wall motion abnormalities. - Aortic valve: A mechanical prosthesis was present. There was mild   regurgitation. - Mitral valve: Calcified annulus. There was mild regurgitation. - Left atrium: The atrium was severely dilated. - Right ventricle: The cavity size was mildly dilated. - Right atrium: The atrium was moderately dilated.  Impressions:  - Normal LV systolic function; mild LVH; mild LVE; s/p AVR with   elevated mean gradient (26 mmHg) and mild AI; mild MR; biatrial   enlargement; mild RVE.   Laboratory Data:  Chemistry Recent Labs  Lab 04/09/19 1316  NA 138  K 4.1  CL 102  CO2 23  GLUCOSE 91  BUN 39*  CREATININE 1.72*  CALCIUM 9.6  GFRNONAA 40*  GFRAA 46*  ANIONGAP 13    No results for input(s): PROT, ALBUMIN, AST, ALT, ALKPHOS, BILITOT in the last 168 hours. Hematology Recent Labs  Lab 04/09/19 1316  WBC 10.4  RBC 5.22  HGB 14.1  HCT 44.0  MCV 84.3  MCH 27.0  MCHC 32.0  RDW 16.9*  PLT 202   Cardiac EnzymesNo results for input(s): TROPONINI in the last 168 hours. No results for input(s): TROPIPOC in the last 168 hours.  BNPNo results for input(s): BNP, PROBNP in the last 168 hours.  DDimer No results for input(s): DDIMER in the last 168 hours.  Radiology/Studies:  Dg Chest 2 View  Result Date: 04/09/2019 CLINICAL DATA:  Weight gain and bilateral lower extremity swelling. EXAM: CHEST - 2 VIEW COMPARISON:  Chest x-ray dated August 20, 2016. FINDINGS: Increased mild cardiomegaly. Prior aortic valve replacement. Normal mediastinal  contours. Mild pulmonary vascular congestion. New small right pleural effusion. No consolidation or pneumothorax. No acute osseous abnormality. IMPRESSION: 1. Increased mild cardiomegaly. Mild pulmonary vascular congestion and small right pleural effusion. Electronically Signed   By: Titus Dubin M.D.   On: 04/09/2019 14:12    Assessment and Plan:   1. Acute combined systolic and diastolic HF.  Will add lasix 80 mg IV BID admit for HF and to diuresis. Dr. Haroldine Laws to see  2. Permanent a fib on coumadin check INR, rate controlled on dig and BB 3. AVR with st jude mechanical valve for severe AS in 2013, with AR moderate up from mild in 2019. 4. Hx of NICM with prior EF of 50-55% 2019 now 40-45% (prior to AVR EF was 45-50% on cath in 2013)  5. OSA on CPAP 6. HLD on fenofibrate and zetia  7. DM-2 with insulin and jardiance, metformin,  victoza  8.  asthma on inhalers.       For questions or updates, please contact [place] Please consult [email or web address] for contact info under     Signed, Cecilie Kicks, NP  04/09/2019 2:23 PM   Patient seen and examined with the above-signed Advanced Practice Provider and/or Housestaff. I personally reviewed laboratory data, imaging studies and relevant notes. I independently examined the patient and formulated the important aspects of the plan. I have edited the note to reflect any of my changes or salient points. I have personally discussed the plan with the patient and/or family.  68 y/o male with h/o permanent AF, mechanical AVR, OSA, CKD 3 and DM2 presents with 1 week h/o progressive HF symptoms not responding to increasing doses of oral lasix. Now with LE edema, orthopnea and PND. Weight up about 8 pounds. Prior to [place] was back at gym and felt he was getting back in shape. Denies CP or dietary indiscretion.   On exam JVP to jaw NAD Carotid 2+ + bruits HEENT normal Cor IRR IRR mechanical crisp s2 Lungs decreased at bases no wheeze Ab obese +  distended Ext 2+ edema no cyanosis or clubbing Neuro: alert & oriented x 3, cranial nerves grossly intact. moves all 4 extremities w/o difficulty. Affect pleasant  CXR + pulmonary edema   I have reviewed recent echos   5/19 EF 50-55% with mild gradient across AVR  2/20 EF read as 40-45% but I feel more like 30-35%  Will admit for IV diuresis. Watch creatinine. Plan repeat echo. I would not be surprised if EF down further. Further steps based on results of echo and response to diuretics.   Glori Bickers, MD  4:22 PM

## 2019-04-09 NOTE — ED Notes (Signed)
Transported to radiology then to room.

## 2019-04-09 NOTE — Telephone Encounter (Deleted)
Trying reach patient for work up.

## 2019-04-10 ENCOUNTER — Inpatient Hospital Stay (HOSPITAL_COMMUNITY): Payer: Medicare Other

## 2019-04-10 DIAGNOSIS — I361 Nonrheumatic tricuspid (valve) insufficiency: Secondary | ICD-10-CM

## 2019-04-10 DIAGNOSIS — I340 Nonrheumatic mitral (valve) insufficiency: Secondary | ICD-10-CM

## 2019-04-10 DIAGNOSIS — I351 Nonrheumatic aortic (valve) insufficiency: Secondary | ICD-10-CM

## 2019-04-10 DIAGNOSIS — I4821 Permanent atrial fibrillation: Secondary | ICD-10-CM

## 2019-04-10 LAB — CBC
HCT: 42.4 % (ref 39.0–52.0)
Hemoglobin: 13.6 g/dL (ref 13.0–17.0)
MCH: 27.1 pg (ref 26.0–34.0)
MCHC: 32.1 g/dL (ref 30.0–36.0)
MCV: 84.6 fL (ref 80.0–100.0)
Platelets: 194 10*3/uL (ref 150–400)
RBC: 5.01 MIL/uL (ref 4.22–5.81)
RDW: 17 % — ABNORMAL HIGH (ref 11.5–15.5)
WBC: 9.2 10*3/uL (ref 4.0–10.5)
nRBC: 0 % (ref 0.0–0.2)

## 2019-04-10 LAB — BASIC METABOLIC PANEL
Anion gap: 11 (ref 5–15)
BUN: 42 mg/dL — ABNORMAL HIGH (ref 8–23)
CO2: 29 mmol/L (ref 22–32)
Calcium: 9.4 mg/dL (ref 8.9–10.3)
Chloride: 98 mmol/L (ref 98–111)
Creatinine, Ser: 2.08 mg/dL — ABNORMAL HIGH (ref 0.61–1.24)
GFR calc Af Amer: 37 mL/min — ABNORMAL LOW (ref >60)
GFR calc non Af Amer: 32 mL/min — ABNORMAL LOW (ref >60)
Glucose, Bld: 122 mg/dL — ABNORMAL HIGH (ref 70–99)
Potassium: 4.5 mmol/L (ref 3.5–5.1)
Sodium: 138 mmol/L (ref 135–145)

## 2019-04-10 LAB — PROTIME-INR
INR: 2.8 — ABNORMAL HIGH (ref 0.8–1.2)
Prothrombin Time: 28.8 seconds — ABNORMAL HIGH (ref 11.4–15.2)

## 2019-04-10 LAB — GLUCOSE, CAPILLARY
Glucose-Capillary: 115 mg/dL — ABNORMAL HIGH (ref 70–99)
Glucose-Capillary: 191 mg/dL — ABNORMAL HIGH (ref 70–99)
Glucose-Capillary: 310 mg/dL — ABNORMAL HIGH (ref 70–99)
Glucose-Capillary: 340 mg/dL — ABNORMAL HIGH (ref 70–99)

## 2019-04-10 LAB — ECHOCARDIOGRAM COMPLETE
Height: 71 in
Weight: 4123.48 oz

## 2019-04-10 LAB — HIV ANTIBODY (ROUTINE TESTING W REFLEX): HIV Screen 4th Generation wRfx: NONREACTIVE

## 2019-04-10 LAB — HEMOGLOBIN A1C
Hgb A1c MFr Bld: 9.3 % — ABNORMAL HIGH (ref 4.8–5.6)
Mean Plasma Glucose: 220.21 mg/dL

## 2019-04-10 MED ORDER — WARFARIN SODIUM 2.5 MG PO TABS
12.5000 mg | ORAL_TABLET | ORAL | Status: DC
  Administered 2019-04-11: 12.5 mg via ORAL
  Filled 2019-04-10: qty 1

## 2019-04-10 MED ORDER — WARFARIN SODIUM 10 MG PO TABS
10.0000 mg | ORAL_TABLET | ORAL | Status: DC
  Administered 2019-04-10: 10 mg via ORAL
  Filled 2019-04-10: qty 1

## 2019-04-10 NOTE — Progress Notes (Addendum)
Progress Note  Patient Name: Joseph Hoffman Date of Encounter: 04/10/2019  Primary Cardiologist:  Shelva Majestic, MD  Subjective   Breathing ok now, had asthma attack during the night w/ SOB and wheezing  Inpatient Medications    Scheduled Meds: . cholecalciferol  1,000 Units Oral QHS  . digoxin  0.125 mg Oral QHS  . docusate sodium  200 mg Oral QHS  . escitalopram  10 mg Oral QPM  . ezetimibe  10 mg Oral QHS  . fenofibrate  160 mg Oral Daily  . furosemide  80 mg Intravenous BID  . insulin aspart  0-5 Units Subcutaneous QHS  . insulin aspart  0-9 Units Subcutaneous TID WC  . insulin glargine  25 Units Subcutaneous BID  . metoprolol succinate  25 mg Oral BID  . mometasone-formoterol  2 puff Inhalation BID  . omega-3 acid ethyl esters  1 g Oral Daily  . potassium chloride  20 mEq Oral BID  . sodium chloride flush  3 mL Intravenous Q12H  . vitamin C  1,000 mg Oral QHS  . Warfarin - Pharmacist Dosing Inpatient   Does not apply q1800   Continuous Infusions: . sodium chloride     PRN Meds: sodium chloride, acetaminophen, albuterol, ALPRAZolam, ondansetron (ZOFRAN) IV, sodium chloride flush, zolpidem   Vital Signs    Vitals:   04/09/19 2331 04/10/19 0406 04/10/19 0607 04/10/19 0743  BP: (!) 103/42 (!) 147/76  (!) 135/59  Pulse: (!) 57 70  73  Resp: 16 10  18   Temp: 97.6 F (36.4 C) 98.1 F (36.7 C)  98.1 F (36.7 C)  TempSrc: Oral Oral  Oral  SpO2: 98% 93%  98%  Weight:   116.9 kg   Height:       No intake or output data in the 24 hours ending 04/10/19 0839 Filed Weights   04/09/19 1254 04/09/19 1737 04/10/19 8657  Weight: 118.4 kg 117.4 kg 116.9 kg   Last Weight  Most recent update: 04/10/2019  6:08 AM   Weight  116.9 kg (257 lb 11.5 oz)           Telemetry    Afib, occ PVCs, rate 50s at times - Personally Reviewed  ECG    None today - Personally Reviewed  Physical Exam   General: Well developed, well nourished, male appearing in no acute distress.  Head: Normocephalic, atraumatic.  Neck: Supple without bruits, JVD not seen elevated, but difficult to assess 2nd body habitus. Lungs:  Resp regular and unlabored, decreased BS bases. Heart: Irreg R&R, S1, S2, no S3, S4, mechanical valve sound w/ crisp S2, soft  Systolic and diastolic murmur.  No rub. Abdomen: Soft, non-tender, non-distended with normoactive bowel sounds. No hepatomegaly. No rebound/guarding. No obvious abdominal masses. Extremities: No clubbing, cyanosis, no edema. Distal pedal pulses are 2+ bilaterally. Neuro: Alert and oriented X 3. Moves all extremities spontaneously. Psych: Normal affect.  Labs    Hematology Recent Labs  Lab 04/09/19 1316 04/10/19 0153  WBC 10.4 9.2  RBC 5.22 5.01  HGB 14.1 13.6  HCT 44.0 42.4  MCV 84.3 84.6  MCH 27.0 27.1  MCHC 32.0 32.1  RDW 16.9* 17.0*  PLT 202 194    Chemistry Recent Labs  Lab 04/09/19 1316 04/10/19 0153  NA 138 138  K 4.1 4.5  CL 102 98  CO2 23 29  GLUCOSE 91 122*  BUN 39* 42*  CREATININE 1.72* 2.08*  CALCIUM 9.6 9.4  GFRNONAA 40* 32*  GFRAA  74* 37*  ANIONGAP 13 11     Cardiac Enzymes Recent Labs  Lab 04/09/19 1536  TROPONINI 0.14*      BNP Recent Labs  Lab 04/09/19 1317  BNP 222.9*    Lab Results  Component Value Date   INR 2.8 (H) 04/10/2019   INR 2.5 (H) 04/09/2019   INR 4.0 (H) 03/13/2019     Radiology    Dg Chest 2 View  Result Date: 04/09/2019 CLINICAL DATA:  Weight gain and bilateral lower extremity swelling. EXAM: CHEST - 2 VIEW COMPARISON:  Chest x-ray dated August 20, 2016. FINDINGS: Increased mild cardiomegaly. Prior aortic valve replacement. Normal mediastinal contours. Mild pulmonary vascular congestion. New small right pleural effusion. No consolidation or pneumothorax. No acute osseous abnormality. IMPRESSION: 1. Increased mild cardiomegaly. Mild pulmonary vascular congestion and small right pleural effusion. Electronically Signed   By: Titus Dubin M.D.   On:  04/09/2019 14:12     Cardiac Studies   ECHO:  ordered  Patient Profile     68 y.o. male w/ hx  permanent a fib, HTN, IDDM-2, s/p St Jude mechanical AVR in 3016, combined systolic and diastolic HF, OSA on CPAP, asthmatic COPD and CRI-3 who was admitted 05/07 for acute CHF  Assessment & Plan     Active Problems: 1.  Acute combined systolic and diastolic heart failure (HCC) - dry wt pta was 253, admit wt 261, now 257 - still w/ DOE, Orthopnea and requires O2 - continue to diurese - pt denies dietary or fluid indiscretions, does not know why he started gaining wt - need to update echo to make sure EF is no lower - minimal trop elevation in setting of CHF and CKD not felt ischemic  2. CKD III - Cr up slightly w/ diuresis, but needs to be drier, continue to follow.   3. Asthma - has home rx ordered, scheduled inhaler and nebs prn - report sx and get nebs prn  4. Perm Afib - no sig pauses, rate ok  5. Chronic anticoag - INR therapeutic  Principal Problem:   Acute combined systolic and diastolic heart failure (HCC) Active Problems:   Permanent atrial fibrillation, since 1994   Chronic anticoagulation, on Coumadin after St Jude AVR   CRI (chronic renal insufficiency), stage 3 (moderate) (HCC)    Signed, Rosaria Ferries , PA-C 8:39 AM 04/10/2019 Pager: [phone]  Attending Note:   The patient was seen and examined.  Agree with assessment and plan as noted above.  Changes made to the above note as needed.  Patient seen and independently examined with Rosaria Ferries, PA.   We discussed all aspects of the encounter. I agree with the assessment and plan as stated above.  1.   Acute on chronic  2.  Status post mechanical aortic valve replacement: Continue Coumadin.   Has soft systolic murmur but also a diastolic murmur Has known moderate AI by previous echo     I have spent a total of 40 minutes with patient reviewing hospital  notes , telemetry, EKGs, labs and  examining patient as well as establishing an assessment and plan that was discussed with the patient. > 50% of time was spent in direct patient care.    Thayer Headings, Brooke Bonito., MD, [facility] 04/10/2019, 12:59 PM [address],  [place] Pager [phone]

## 2019-04-10 NOTE — Progress Notes (Signed)
ANTICOAGULATION CONSULT NOTE - Follow Up Consult  Pharmacy Consult for Warfarin Indication: Atrial fibrillation and mechanical valve  Allergies  Allergen Reactions  . Iohexol Anaphylaxis  . Niacin And Related     Flushing with immediate realese  . Penicillins Other (See Comments)    Unknown.[name]  Did it involve swelling of the face/tongue/throat, SOB, or low BP? Unknown Did it involve sudden or severe rash/hives, skin peeling, or any reaction on the inside of your mouth or nose? Unknown Did you need to seek medical attention at a hospital or doctor's office? Unknown When did it last happen?Childhood If all above answers are "NO", may proceed with cephalosporin use.    Vital Signs: Temp: 98.6 F (37 C) (05/08 1123) Temp Source: Oral (05/08 1123) BP: 147/93 (05/08 1123) Pulse Rate: 80 (05/08 0905)  Labs: Recent Labs    04/09/19 1316 04/09/19 1536 04/10/19 0153  HGB 14.1  --  13.6  HCT 44.0  --  42.4  PLT 202  --  194  LABPROT  --  26.8* 28.8*  INR  --  2.5* 2.8*  CREATININE 1.72*  --  2.08*  TROPONINI  --  0.14*  --     Estimated Creatinine Clearance: 43.6 mL/min (A) (by C-G formula based on SCr of 2.08 mg/dL (H)).   Medical History: Past Medical History:  Diagnosis Date  . Anxiety   . Aortic stenosis   . Arthritis   . Asthma   . Asymptomatic LV dysfunction, EF 45%, normal coronary arteries on cardiac cath 09/18/12 09/30/2012  . CHF (congestive heart failure) ([place])   . Chronic anticoagulation, on Xarelto prior to admit 09/30/2012  . DM (diabetes mellitus) ([place]) 09/30/2012  . Hepatitis 2003  . Hypertension   . Myocardial infarction ([place])   . Permanent atrial fibrillation, since 1994 09/30/2012   stress test 02/08/12- normal study, no significant ischemia  . S/P AVR (aortic valve replacement), 09/30/2012   a. s/p mechcanical AVR in 09/2012 (on Coumadin)  . Sleep apnea    uses CPAP    Assessment: Pt is a 4 yoM admitted with  HF/volume overload - iv furosemide.  HX atrial fibrillation CVR on Toprol and sp mechanical AVR (2013). He is on warfarin PTA. Home dose warfarin 10 mg on M/W/F/Su and 12.5 on Tu/Th/Sat.   INR therapeutic at 2.8. No signs/symptoms of bleeding. CBC WNL  Goal of Therapy:  INR 2.5-3.5     Plan:  Restart home warfarin 12.5mg  TTSa/10mg  MWFSu Daily INR/CBC   Bonnita Nasuti Pharm.D. CPP, BCPS Clinical Pharmacist [phone] 04/10/2019 11:42 AM

## 2019-04-10 NOTE — Progress Notes (Signed)
  Echocardiogram 2D Echocardiogram has been performed.  Joseph Hoffman 04/10/2019, 1:38 PM

## 2019-04-10 NOTE — Progress Notes (Signed)
Inpatient Diabetes Program Recommendations  AACE/ADA: New Consensus Statement on Inpatient Glycemic Control (2015)  Target Ranges:  Prepandial:   less than 140 mg/dL      Peak postprandial:   less than 180 mg/dL (1-2 hours)      Critically ill patients:  140 - 180 mg/dL   Results for CURRY, SEEFELDT (MRN [id number]) as of 04/10/2019 07:32  Ref. Range 04/09/2019 16:42 04/09/2019 17:09 04/09/2019 17:42 04/09/2019 21:40  Glucose-Capillary Latest Ref Range: 70 - 99 mg/dL 79 124 (H) 169 (H) 219 (H)  2 units NOVOLOG    Results for HARWOOD, NALL (MRN [id number]) as of 04/10/2019 07:32  Ref. Range 04/10/2019 06:06  Glucose-Capillary Latest Ref Range: 70 - 99 mg/dL 115 (H)   Results for REYAN, HELLE (MRN [id number]) as of 04/10/2019 07:32  Ref. Range 09/17/2018 09:03 04/10/2019 01:53  Hemoglobin A1C Latest Ref Range: 4.8 - 5.6 % 10.1 (H) 9.3 (H)  (220 mg/dl)    Admit with: Acute combined systolic and diastolic HF  History: DM, CHF, COPD  Home DM Meds: Jardiance 25 mg QHS       Amaryl 2 mg Daily       Lantus 45 units BID       Victoza 1.8 mg QHS       Metformin XR 2000 mg QHS  Current Orders: Lantus 25 units BID      Novolog Sensitive Correction Scale/ SSI (0-9 units) TID AC + HS     Endocrinologist: Dr. Rodman Key Gorris--Last seen 02/06/2019.  At that visit, pt's Amaryl and Victoza were restarted.  MD discussed with pt that they will likely need to add prandial insulin to pt's home regimen at some point.  Pt was also to restart using his Freestyle Libre CGM at home to get better CBG data.  Pt is to return to the ENDO clinic in May.    MD- Note Lantus to start this AM.  Agree with current Insulin regimen for now.  Do not recommend restart of home oral DM meds until closer to discharge.    --Will follow patient during hospitalization--  Wyn Quaker RN, MSN, CDE Diabetes Coordinator Inpatient Glycemic Control Team Team Pager: [phone] (8a-5p)

## 2019-04-10 NOTE — Plan of Care (Signed)

## 2019-04-11 LAB — BASIC METABOLIC PANEL
Anion gap: 14 (ref 5–15)
BUN: 43 mg/dL — ABNORMAL HIGH (ref 8–23)
CO2: 29 mmol/L (ref 22–32)
Calcium: 9.4 mg/dL (ref 8.9–10.3)
Chloride: 95 mmol/L — ABNORMAL LOW (ref 98–111)
Creatinine, Ser: 1.85 mg/dL — ABNORMAL HIGH (ref 0.61–1.24)
GFR calc Af Amer: 42 mL/min — ABNORMAL LOW (ref >60)
GFR calc non Af Amer: 36 mL/min — ABNORMAL LOW (ref >60)
Glucose, Bld: 238 mg/dL — ABNORMAL HIGH (ref 70–99)
Potassium: 4.8 mmol/L (ref 3.5–5.1)
Sodium: 138 mmol/L (ref 135–145)

## 2019-04-11 LAB — GLUCOSE, CAPILLARY
Glucose-Capillary: 141 mg/dL — ABNORMAL HIGH (ref 70–99)
Glucose-Capillary: 213 mg/dL — ABNORMAL HIGH (ref 70–99)
Glucose-Capillary: 254 mg/dL — ABNORMAL HIGH (ref 70–99)
Glucose-Capillary: 346 mg/dL — ABNORMAL HIGH (ref 70–99)

## 2019-04-11 LAB — CBC
HCT: 44.5 % (ref 39.0–52.0)
Hemoglobin: 14.3 g/dL (ref 13.0–17.0)
MCH: 27.3 pg (ref 26.0–34.0)
MCHC: 32.1 g/dL (ref 30.0–36.0)
MCV: 84.9 fL (ref 80.0–100.0)
Platelets: 192 10*3/uL (ref 150–400)
RBC: 5.24 MIL/uL (ref 4.22–5.81)
RDW: 17 % — ABNORMAL HIGH (ref 11.5–15.5)
WBC: 9.4 10*3/uL (ref 4.0–10.5)
nRBC: 0 % (ref 0.0–0.2)

## 2019-04-11 LAB — TROPONIN I: Troponin I: 0.1 ng/mL (ref <0.03)

## 2019-04-11 NOTE — Progress Notes (Signed)
**Note Joseph Hoffman-Identified via Obfuscation** Progress Note  Patient Name: DEMORRIS Hoffman Date of Encounter: 04/11/2019  Primary Cardiologist:   Shelva Majestic, MD   Subjective   He feels back to baseline.  Denies SOB or pain  Inpatient Medications    Scheduled Meds: . cholecalciferol  1,000 Units Oral QHS  . digoxin  0.125 mg Oral QHS  . docusate sodium  200 mg Oral QHS  . escitalopram  10 mg Oral QPM  . ezetimibe  10 mg Oral QHS  . fenofibrate  160 mg Oral Daily  . furosemide  80 mg Intravenous BID  . insulin aspart  0-5 Units Subcutaneous QHS  . insulin aspart  0-9 Units Subcutaneous TID WC  . insulin glargine  25 Units Subcutaneous BID  . metoprolol succinate  25 mg Oral BID  . mometasone-formoterol  2 puff Inhalation BID  . omega-3 acid ethyl esters  1 g Oral Daily  . potassium chloride  20 mEq Oral BID  . sodium chloride flush  3 mL Intravenous Q12H  . vitamin C  1,000 mg Oral QHS  . warfarin  10 mg Oral Once per day on Sun Mon Wed Fri  . warfarin  12.5 mg Oral Once per day on Tue Thu Sat  . Warfarin - Pharmacist Dosing Inpatient   Does not apply q1800   Continuous Infusions: . sodium chloride     PRN Meds: sodium chloride, acetaminophen, albuterol, ALPRAZolam, ondansetron (ZOFRAN) IV, sodium chloride flush, zolpidem   Vital Signs    Vitals:   04/10/19 2338 04/11/19 0327 04/11/19 0500 04/11/19 0752  BP: (!) 156/62 134/72    Pulse: (!) 52 (!) 51    Resp: 17 18  (!) 65  Temp: 98.1 F (36.7 C) 98 F (36.7 C)  97.8 F (36.6 C)  TempSrc: Axillary Oral  Oral  SpO2: 95% 99%    Weight:   115.1 kg   Height:        Intake/Output Summary (Last 24 hours) at 04/11/2019 1014 Last data filed at 04/11/2019 0754 Gross per 24 hour  Intake 1300 ml  Output 551 ml  Net 749 ml   Filed Weights   04/09/19 1737 04/10/19 0607 04/11/19 0500  Weight: 117.4 kg 116.9 kg 115.1 kg    Telemetry    NSR - Personally Reviewed  ECG    NA - Personally Reviewed  Physical Exam   GEN: No acute distress.   Neck: No  JVD  Cardiac: RRR, 2/6 apical systolic murmurs, rubs, or gallops.  Respiratory:   Clear GI: Soft, nontender, non-distended  MS: No  edema; No deformity. Neuro:  Nonfocal  Psych: Normal affect   Labs    Chemistry Recent Labs  Lab 04/09/19 1316 04/10/19 0153  NA 138 138  K 4.1 4.5  CL 102 98  CO2 23 29  GLUCOSE 91 122*  BUN 39* 42*  CREATININE 1.72* 2.08*  CALCIUM 9.6 9.4  GFRNONAA 40* 32*  GFRAA 46* 37*  ANIONGAP 13 11     Hematology Recent Labs  Lab 04/09/19 1316 04/10/19 0153  WBC 10.4 9.2  RBC 5.22 5.01  HGB 14.1 13.6  HCT 44.0 42.4  MCV 84.3 84.6  MCH 27.0 27.1  MCHC 32.0 32.1  RDW 16.9* 17.0*  PLT 202 194    Cardiac Enzymes Recent Labs  Lab 04/09/19 1536  TROPONINI 0.14*   No results for input(s): TROPIPOC in the last 168 hours.   BNP Recent Labs  Lab 04/09/19 1317  BNP 222.9*  DDimer No results for input(s): DDIMER in the last 168 hours.   Radiology    Dg Chest 2 View  Result Date: 04/09/2019 CLINICAL DATA:  Weight gain and bilateral lower extremity swelling. EXAM: CHEST - 2 VIEW COMPARISON:  Chest x-ray dated August 20, 2016. FINDINGS: Increased mild cardiomegaly. Prior aortic valve replacement. Normal mediastinal contours. Mild pulmonary vascular congestion. New small right pleural effusion. No consolidation or pneumothorax. No acute osseous abnormality. IMPRESSION: 1. Increased mild cardiomegaly. Mild pulmonary vascular congestion and small right pleural effusion. Electronically Signed   By: Titus Dubin M.D.   On: 04/09/2019 14:12    Cardiac Studies   ECHO 04/11/19     1. The left ventricle has mildly reduced systolic function, with an ejection fraction of 45-50%. The cavity size was normal. There is moderately increased left ventricular wall thickness. Left ventricular diastolic function could not be evaluated  secondary to atrial fibrillation. Elevated left atrial and left ventricular end-diastolic pressures.  2. The right  ventricle has mildly reduced systolic function. The cavity was moderately enlarged. There is no increase in right ventricular wall thickness.  3. Left atrial size was severely dilated.  4. Right atrial size was severely dilated.  5. The mitral valve is abnormal. Mild thickening of the mitral valve leaflet. Mild calcification of the mitral valve leaflet. There is mild mitral annular calcification present. Mitral valve regurgitation is mild to moderate by color flow Doppler.  Mild-moderate mitral valve stenosis.  6. The tricuspid valve is grossly normal.  7. Moderate calcification of the aortic valve. Aortic valve regurgitation is mild by color flow Doppler. Moderate stenosis of the aortic valve.  8. The inferior vena cava was dilated in size with <50% respiratory variability.  Patient Profile     68 y.o. male w/ hx permanent a fib, HTN, IDDM-2, s/p St Jude mechanical AVR in 2440, combined systolic and diastolic HF, OSA on CPAP, asthmatic COPD and CRI-3who was admitted 05/07 for acute CHF  Assessment & Plan    ACUTE COMBINED SYSTOLIC HF:  I am not sure that the I/O are complete.    Weight however appears to be down from 261 to 254 lbs.  EF as above is unchanged from previous.    Stop IV Lasix.  Likely home in the AM pending his creat which took a significant bump.   ELEVATED TROPONIN:  Need to trend his troponin.    CKD III:    Creat is up slightly.  Likely slight overdiuresis.  I will stop the Lasix.  Check in AM.   ATRIAL FIB:  Permanent.  Continue warfarin.    For questions or updates, please contact [place] Please consult [email or web address] for contact info under Cardiology/STEMI.   Signed, Minus Breeding, MD  04/11/2019, 10:14 AM

## 2019-04-11 NOTE — Progress Notes (Addendum)
Patient remains in atrial fib. Throughout night patient has had rates as low as 55-35. Rate quickly goes back up to 60-70 and pt remains alert and oriented. He awakens easily and reports no c/o cp or sob. Will continue to monitor.

## 2019-04-11 NOTE — Progress Notes (Signed)
ANTICOAGULATION CONSULT NOTE - Follow Up Consult  Pharmacy Consult for Warfarin Indication: Atrial fibrillation and mechanical valve  Allergies  Allergen Reactions  . Iohexol Anaphylaxis  . Niacin And Related     Flushing with immediate realese  . Penicillins Other (See Comments)    Unknown.[name]  Did it involve swelling of the face/tongue/throat, SOB, or low BP? Unknown Did it involve sudden or severe rash/hives, skin peeling, or any reaction on the inside of your mouth or nose? Unknown Did you need to seek medical attention at a hospital or doctor's office? Unknown When did it last happen?Childhood If all above answers are "NO", may proceed with cephalosporin use.    Vital Signs: Temp: 97.8 F (36.6 C) (05/09 0752) Temp Source: Oral (05/09 0752) BP: 134/72 (05/09 0327) Pulse Rate: 51 (05/09 0327)  Labs: Recent Labs    04/09/19 1316 04/09/19 1536 04/10/19 0153  HGB 14.1  --  13.6  HCT 44.0  --  42.4  PLT 202  --  194  LABPROT  --  26.8* 28.8*  INR  --  2.5* 2.8*  CREATININE 1.72*  --  2.08*  TROPONINI  --  0.14*  --     Estimated Creatinine Clearance: 43.2 mL/min (A) (by C-G formula based on SCr of 2.08 mg/dL (H)).   Medical History: Past Medical History:  Diagnosis Date  . Anxiety   . Aortic stenosis   . Arthritis   . Asthma   . Asymptomatic LV dysfunction, EF 45%, normal coronary arteries on cardiac cath 09/18/12 09/30/2012  . CHF (congestive heart failure) ([place])   . Chronic anticoagulation, on Xarelto prior to admit 09/30/2012  . DM (diabetes mellitus) ([place]) 09/30/2012  . Hepatitis 2003  . Hypertension   . Myocardial infarction ([place])   . Permanent atrial fibrillation, since 1994 09/30/2012   stress test 02/08/12- normal study, no significant ischemia  . S/P AVR (aortic valve replacement), 09/30/2012   a. s/p mechcanical AVR in 09/2012 (on Coumadin)  . Sleep apnea    uses CPAP    Assessment: Pt is a 1 yoM admitted with  HF/volume overload - iv furosemide. He has a history of  atrial fibrillation s/p mechanical AVR (2013). He is on warfarin PTA. Home dose warfarin 10 mg on M/W/F/Su and 12.5 on Tu/Th/Sat.   INR therapeutic at 2.8 (last was 5/8)  Goal of Therapy:  INR 2.5-3.5     Plan:  Continue t home warfarin 12.5mg  TTSa/10mg  MWFSu Will check INR on 5/10 then daily  Hildred Laser, PharmD Clinical Pharmacist **Pharmacist phone directory can now be found on [email or web address] ([email or web address]).  Listed under [place].

## 2019-04-11 NOTE — Progress Notes (Signed)
Upon tallying Is&Os for shift, RN noticed no urine output had been documented for patient.  Patient states he got up to go to the toilet when he needed to urinate and did not use the urinal.  He is unsure of how many times he went as he "cannot keep count and is exhausted".  Patient re-educated to use the urinal so we can get accurate output amounts.

## 2019-04-11 NOTE — Progress Notes (Signed)
Inpatient Diabetes Program Recommendations  AACE/ADA: New Consensus Statement on Inpatient Glycemic Control (2015)  Target Ranges:  Prepandial:   less than 140 mg/dL      Peak postprandial:   less than 180 mg/dL (1-2 hours)      Critically ill patients:  140 - 180 mg/dL   Lab Results  Component Value Date   GLUCAP 141 (H) 04/11/2019   HGBA1C 9.3 (H) 04/10/2019    Review of Glycemic Control Results for Joseph Hoffman, Joseph Hoffman (MRN [id number]) as of 04/11/2019 09:29  Ref. Range 04/10/2019 06:06 04/10/2019 11:25 04/10/2019 16:22 04/10/2019 20:38 04/11/2019 06:25  Glucose-Capillary Latest Ref Range: 70 - 99 mg/dL 115 (H) 191 (H) 310 (H) 340 (H) 141 (H)  Home DM Meds: Jardiance 25 mg QHS                             Amaryl 2 mg Daily                             Lantus 45 units BID                             Victoza 1.8 mg QHS                             Metformin XR 2000 mg QHS  Current Orders: Lantus 25 units BID                            Novolog Sensitive Correction Scale/ SSI (0-9 units) TID AC + HS  Inpatient Diabetes Program Recommendations:   Please consider adding Novolog meal coverage 3 units tid with meals (Hold if patient eats less than 50%).   Thanks,  Adah Perl, RN, BC-ADM Inpatient Diabetes Coordinator Pager [phone] (8a-5p)

## 2019-04-11 NOTE — Progress Notes (Signed)
Dr Percival Spanish asking why no Covid 19 test has been done on patient as they are all now required before coming to the the units.  Charge nurse spoke with Ok Edwards and patient was admitted 5/7 and he reports the hospital is not backtracking on the tests for those admitted before they policy was enacted

## 2019-04-12 LAB — BASIC METABOLIC PANEL
Anion gap: 12 (ref 5–15)
BUN: 39 mg/dL — ABNORMAL HIGH (ref 8–23)
CO2: 29 mmol/L (ref 22–32)
Calcium: 9.2 mg/dL (ref 8.9–10.3)
Chloride: 95 mmol/L — ABNORMAL LOW (ref 98–111)
Creatinine, Ser: 1.78 mg/dL — ABNORMAL HIGH (ref 0.61–1.24)
GFR calc Af Amer: 44 mL/min — ABNORMAL LOW (ref >60)
GFR calc non Af Amer: 38 mL/min — ABNORMAL LOW (ref >60)
Glucose, Bld: 148 mg/dL — ABNORMAL HIGH (ref 70–99)
Potassium: 3.7 mmol/L (ref 3.5–5.1)
Sodium: 136 mmol/L (ref 135–145)

## 2019-04-12 LAB — PROTIME-INR
INR: 3.4 — ABNORMAL HIGH (ref 0.8–1.2)
Prothrombin Time: 34 seconds — ABNORMAL HIGH (ref 11.4–15.2)

## 2019-04-12 LAB — GLUCOSE, CAPILLARY
Glucose-Capillary: 112 mg/dL — ABNORMAL HIGH (ref 70–99)
Glucose-Capillary: 224 mg/dL — ABNORMAL HIGH (ref 70–99)

## 2019-04-12 LAB — CBC
HCT: 41.1 % (ref 39.0–52.0)
Hemoglobin: 13.3 g/dL (ref 13.0–17.0)
MCH: 27.2 pg (ref 26.0–34.0)
MCHC: 32.4 g/dL (ref 30.0–36.0)
MCV: 84 fL (ref 80.0–100.0)
Platelets: 185 10*3/uL (ref 150–400)
RBC: 4.89 MIL/uL (ref 4.22–5.81)
RDW: 16.8 % — ABNORMAL HIGH (ref 11.5–15.5)
WBC: 9.6 10*3/uL (ref 4.0–10.5)
nRBC: 0 % (ref 0.0–0.2)

## 2019-04-12 MED ORDER — FUROSEMIDE 40 MG PO TABS: 40.0000 mg | ORAL_TABLET | Freq: Every day | ORAL | 3 refills | Status: DC

## 2019-04-12 MED ORDER — WARFARIN SODIUM 7.5 MG PO TABS: 7.5000 mg | ORAL_TABLET | Freq: Once | ORAL | Status: DC

## 2019-04-12 NOTE — Discharge Summary (Signed)
Discharge Summary    Patient ID: Joseph Hoffman MRN: [id number]; DOB: 04/05/1951  Admit date: 04/09/2019 Discharge date: 04/12/2019  Primary Care Provider: Orpah Melter, MD  Primary Cardiologist: Shelva Majestic, MD  Primary Electrophysiologist:  None   Discharge Diagnoses    Principal Problem:   Acute combined systolic and diastolic heart failure [facility]) Active Problems:   Permanent atrial fibrillation, since 1994   Chronic anticoagulation, on Coumadin after St Jude AVR   CRI (chronic renal insufficiency), stage 3 (moderate) (HCC)   Allergies Allergies  Allergen Reactions  . Iohexol Anaphylaxis  . Niacin And Related     Flushing with immediate realese  . Penicillins Other (See Comments)    Unknown.[name]  Did it involve swelling of the face/tongue/throat, SOB, or low BP? Unknown Did it involve sudden or severe rash/hives, skin peeling, or any reaction on the inside of your mouth or nose? Unknown Did you need to seek medical attention at a hospital or doctor's office? Unknown When did it last happen?Childhood If all above answers are "NO", may proceed with cephalosporin use.    Diagnostic Studies/Procedures    ECHO 04/11/19    1. The left ventricle has mildly reduced systolic function, with an ejection fraction of 45-50%. The cavity size was normal. There is moderately increased left ventricular wall thickness. Left ventricular diastolic function could not be evaluated  secondary to atrial fibrillation. Elevated left atrial and left ventricular end-diastolic pressures. 2. The right ventricle has mildly reduced systolic function. The cavity was moderately enlarged. There is no increase in right ventricular wall thickness. 3. Left atrial size was severely dilated. 4. Right atrial size was severely dilated. 5. The mitral valve is abnormal. Mild thickening of the mitral valve leaflet. Mild calcification of the mitral valve leaflet. There is mild  mitral annular calcification present. Mitral valve regurgitation is mild to moderate by color flow Doppler.  Mild-moderate mitral valve stenosis. 6. The tricuspid valve is grossly normal. 7. Moderate calcification of the aortic valve. Aortic valve regurgitation is mild by color flow Doppler. Moderate stenosis of the aortic valve. 8. The inferior vena cava was dilated in size with <50% respiratory variability. _____________   History of Present Illness     Joseph Hoffman is a 68 y.o. male with a hx of permanent a fib, HTN, IDDM-2, s/p St Jude mechanical AVR in 9169, combined systolic and diastolic HF, OSA on CPAP, asthmatic, COPD and CRI-3. He had a cath 09/2012 prior to AVR and no significant CAD, echo 04/2017 with EF 50-55%.  In Feb 2020 he was volume overloaded with fluid indiscretion, echo 01/2019 with EF 40-45% severe biatrial enlargement, mechanical AV with moderate AR, and mean gradient on mechanical aortic valve has decreased from 26 mmHg to 16 mmhg-mildly elevated.   Lasix increased to 40 mg BID.  On 04/09/19 he had telehealth visit with increasing DOE, orthopnea and lower ext edema and increased his lasix to 120 mg daily.  He admitted to be SOB at rest. Wt was up 9 lbs from March.   He was instructed to go to ER as outpt med changes not helping.    He was having to sit up at night due to SOB.  Lt ankle swollen.  No chest pain.   He was on lasix 40 mg then he went up to 60 mg then 80 mg daily and on day prior to appointment up to 120 mg without help.  He does use inhalers because he does no  how much is asthma or fluid. No colds or fevers.     Hospital Course     Consultants: None  BNP was 222.9. Chest xray showed mild pulmonary vascular congestion and small right pleural effusion. The patient's echo was update with EF unchanged from previous, 45-50%. He was diuresed with Lasix 80 mg IV BID with adequate although not robust diuresis. His symptoms have improved. He says his breathing is better  than it has been since February. He will be discharged home on lasix 40 mg daily with extra dose of 40 mg if weight increases by 2 pounds. Troponin was mildly elevated at 0.14 and trended down to 0.10. SCr rose to 2.08 with diuresis, 1.78 on day of discharge.   He continues on warfarin for stroke risk reduction due to afib. He has mild AI-AS per echo.  He will follow up with Dr. Claiborne Billings in 7 days by virtual visit.  _____________  Discharge Vitals Blood pressure (!) 145/68, pulse (!) 110, temperature 98.2 F (36.8 C), temperature source Oral, resp. rate 20, height 5\' 11"  (1.803 m), weight 115.8 kg, SpO2 95 %.  Filed Weights   04/10/19 0607 04/11/19 0500 04/12/19 0500  Weight: 116.9 kg 115.1 kg 115.8 kg    Labs & Radiologic Studies    CBC Recent Labs    04/11/19 1021 04/12/19 0347  WBC 9.4 9.6  HGB 14.3 13.3  HCT 44.5 41.1  MCV 84.9 84.0  PLT 192 433   Basic Metabolic Panel Recent Labs    04/09/19 1536  04/11/19 1021 04/12/19 0347  NA  --    < > 138 136  K  --    < > 4.8 3.7  CL  --    < > 95* 95*  CO2  --    < > 29 29  GLUCOSE  --    < > 238* 148*  BUN  --    < > 43* 39*  CREATININE  --    < > 1.85* 1.78*  CALCIUM  --    < > 9.4 9.2  MG 2.6*  --   --   --    < > = values in this interval not displayed.   Liver Function Tests No results for input(s): AST, ALT, ALKPHOS, BILITOT, PROT, ALBUMIN in the last 72 hours. No results for input(s): LIPASE, AMYLASE in the last 72 hours. Cardiac Enzymes Recent Labs    04/09/19 1536 04/11/19 1100  TROPONINI 0.14* 0.10*   BNP Invalid input(s): POCBNP D-Dimer No results for input(s): DDIMER in the last 72 hours. Hemoglobin A1C Recent Labs    04/10/19 0153  HGBA1C 9.3*   Fasting Lipid Panel No results for input(s): CHOL, HDL, LDLCALC, TRIG, CHOLHDL, LDLDIRECT in the last 72 hours. Thyroid Function Tests Recent Labs    04/09/19 1536  TSH 2.235   _____________  Dg Chest 2 View  Result Date: 04/09/2019 CLINICAL DATA:   Weight gain and bilateral lower extremity swelling. EXAM: CHEST - 2 VIEW COMPARISON:  Chest x-ray dated August 20, 2016. FINDINGS: Increased mild cardiomegaly. Prior aortic valve replacement. Normal mediastinal contours. Mild pulmonary vascular congestion. New small right pleural effusion. No consolidation or pneumothorax. No acute osseous abnormality. IMPRESSION: 1. Increased mild cardiomegaly. Mild pulmonary vascular congestion and small right pleural effusion. Electronically Signed   By: Titus Dubin M.D.   On: 04/09/2019 14:12   Disposition   Pt is being discharged home today in good condition.  Follow-up Plans & Appointments  Follow-up Information    Troy Sine, MD Follow up.   Specialty:  Cardiology Why:  You will be called on Monday to arrange for a 1 week hospital follow up. This will be a virtual visit from home. Please see attached instructions for the visit. Contact information: [address] [address] [place] [address] [phone]          Discharge Instructions    (HEART FAILURE PATIENTS) Call MD:  Anytime you have any of the following symptoms: 1) 3 pound weight gain in 24 hours or 5 pounds in 1 week 2) shortness of breath, with or without a dry hacking cough 3) swelling in the hands, feet or stomach 4) if you have to sleep on extra pillows at night in order to breathe.   Complete by:  As directed    Diet - low sodium heart healthy   Complete by:  As directed    Discharge instructions   Complete by:  As directed    Take lasix 40 mg daily and can take an extra dose as needed for weight increase of 2 pounds or more.   Increase activity slowly   Complete by:  As directed       Discharge Medications   Allergies as of 04/12/2019      Reactions   Iohexol Anaphylaxis   Niacin And Related    Flushing with immediate realese   Penicillins Other (See Comments)   Unknown.[name] Did it involve swelling of the face/tongue/throat,  SOB, or low BP? Unknown Did it involve sudden or severe rash/hives, skin peeling, or any reaction on the inside of your mouth or nose? Unknown Did you need to seek medical attention at a hospital or doctor's office? Unknown When did it last happen?Childhood If all above answers are "NO", may proceed with cephalosporin use.      Medication List    TAKE these medications   acetaminophen 325 MG tablet Commonly known as:  TYLENOL Take 2 tablets (650 mg total) by mouth every 4 (four) hours as needed for pain or fever.   albuterol 108 (90 Base) MCG/ACT inhaler Commonly known as:  VENTOLIN HFA Inhale 2 puffs into the lungs every 6 (six) hours as needed. For shortness of breath   B-D ULTRAFINE III SHORT PEN 31G X 8 MM Misc Generic drug:  Insulin Pen Needle   CENTRUM SPECIALIST HEART PO Take 1 tablet by mouth 2 (two) times daily.   cholecalciferol 1000 units tablet Commonly known as:  VITAMIN D Take 1,000 Units by mouth at bedtime.   Chromium 200 MCG Caps Take 200 mcg by mouth at bedtime.   clotrimazole-betamethasone cream Commonly known as:  LOTRISONE Apply 1 application topically as needed.   digoxin 0.125 MG tablet Commonly known as:  LANOXIN TAKE 1 TABLET EVERY DAY What changed:  when to take this   docusate sodium 100 MG capsule Commonly known as:  COLACE Take 200 mg by mouth at bedtime.   Easy Touch Lancets 30G/Twist Misc   empagliflozin 25 MG Tabs tablet Commonly known as:  Jardiance Take 25 mg by mouth daily. What changed:  when to take this   escitalopram 10 MG tablet Commonly known as:  LEXAPRO Take 10 mg by mouth every evening.   ezetimibe 10 MG tablet Commonly known as:  ZETIA Take 1 tablet (10 mg total) by mouth daily. What changed:  when to take this   fenofibrate 145 MG tablet Commonly known as:  [employer]  Take 1 tablet (145 mg total) by mouth daily.   Fluticasone-Salmeterol 250-50 MCG/DOSE Aepb Commonly known as:  ADVAIR Inhale 1 puff into  the lungs as needed (shortness of breath). For asthma related symptoms   furosemide 40 MG tablet Commonly known as:  LASIX Take 1 tablet (40 mg total) by mouth daily. Can take an additional tablet if weight increases by 2 lbs or more. What changed:  additional instructions   glimepiride 2 MG tablet Commonly known as:  AMARYL Take 2 mg by mouth daily with breakfast.   GLUCOSAMINE 1500 COMPLEX PO Take 1,500 mg by mouth at bedtime.   Icosapent Ethyl 1 g Caps Commonly known as:  Vascepa Take 2 capsules (2 g total) by mouth 2 (two) times daily.   insulin glargine 100 UNIT/ML injection Commonly known as:  LANTUS Inject 45 Units into the skin 2 (two) times daily.   metFORMIN 500 MG 24 hr tablet Commonly known as:  GLUCOPHAGE-XR Take 2,000 mg by mouth at bedtime.   metoprolol succinate 25 MG 24 hr tablet Commonly known as:  Toprol XL Take 1 tablet (25 mg total) by mouth daily. What changed:  when to take this   omega-3 acid ethyl esters 1 g capsule Commonly known as:  LOVAZA Take 1 g by mouth daily.   VICTOZA [place] Inject 1.8 mg into the skin at bedtime.   vitamin C 1000 MG tablet Take 1,000 mg by mouth at bedtime.   warfarin 10 MG tablet Commonly known as:  COUMADIN Take 10 mg by mouth See admin instructions. Take Sun. Mon. Wed.. Fri What changed:  Another medication with the same name was changed. Make sure you understand how and when to take each.   warfarin 5 MG tablet Commonly known as:  COUMADIN Take as instructed with 10 mg. What changed:    how much to take  how to take this  when to take this  additional instructions        Acute coronary syndrome (MI, NSTEMI, STEMI, etc) this admission?:  No.  The elevated Troponin was due to the acute medical illness or demand ischemia.    Outstanding Labs/Studies   Follow up renal function at office visit.   Duration of Discharge Encounter   Greater than 30 minutes including physician time.  Signed, Daune Perch, NP 04/12/2019, 12:27 PM

## 2019-04-12 NOTE — Progress Notes (Signed)
ANTICOAGULATION CONSULT NOTE - Follow Up Consult  Pharmacy Consult for Warfarin Indication: Atrial fibrillation and mechanical valve  Allergies  Allergen Reactions  . Iohexol Anaphylaxis  . Niacin And Related     Flushing with immediate realese  . Penicillins Other (See Comments)    Unknown.[name]  Did it involve swelling of the face/tongue/throat, SOB, or low BP? Unknown Did it involve sudden or severe rash/hives, skin peeling, or any reaction on the inside of your mouth or nose? Unknown Did you need to seek medical attention at a hospital or doctor's office? Unknown When did it last happen?Childhood If all above answers are "NO", may proceed with cephalosporin use.    Vital Signs: Temp: 97.5 F (36.4 C) (05/10 0735) Temp Source: Oral (05/10 0735) BP: 137/71 (05/10 0500) Pulse Rate: 110 (05/10 0735)  Labs: Recent Labs    04/09/19 1536 04/10/19 0153 04/11/19 1021 04/11/19 1100 04/12/19 0347  HGB  --  13.6 14.3  --  13.3  HCT  --  42.4 44.5  --  41.1  PLT  --  194 192  --  185  LABPROT 26.8* 28.8*  --   --  34.0*  INR 2.5* 2.8*  --   --  3.4*  CREATININE  --  2.08* 1.85*  --  1.78*  TROPONINI 0.14*  --   --  0.10*  --     Estimated Creatinine Clearance: 50.7 mL/min (A) (by C-G formula based on SCr of 1.78 mg/dL (H)).   Medical History: Past Medical History:  Diagnosis Date  . Anxiety   . Aortic stenosis   . Arthritis   . Asthma   . Asymptomatic LV dysfunction, EF 45%, normal coronary arteries on cardiac cath 09/18/12 09/30/2012  . CHF (congestive heart failure) ([place])   . Chronic anticoagulation, on Xarelto prior to admit 09/30/2012  . DM (diabetes mellitus) ([place]) 09/30/2012  . Hepatitis 2003  . Hypertension   . Myocardial infarction ([place])   . Permanent atrial fibrillation, since 1994 09/30/2012   stress test 02/08/12- normal study, no significant ischemia  . S/P AVR (aortic valve replacement), 09/30/2012   a. s/p mechcanical AVR in  09/2012 (on Coumadin)  . Sleep apnea    uses CPAP    Assessment: Pt is a 73 yoM admitted with HF/volume overload - iv furosemide. He has a history of  atrial fibrillation s/p mechanical AVR (2013). He is on warfarin PTA. Home dose warfarin 10 mg on M/W/F/Su and 12.5 on Tu/Th/Sat.  -INR= 3.4 with trend up   Goal of Therapy:  INR 2.5-3.5     Plan:  -Coumadin 7.5mg  today -Daily PT/INR  Hildred Laser, PharmD Clinical Pharmacist **Pharmacist phone directory can now be found on [email or web address] ([email or web address]).  Listed under [place].

## 2019-04-12 NOTE — Progress Notes (Signed)
Progress Note  Patient Name: Joseph Hoffman Date of Encounter: 04/12/2019  Primary Cardiologist:   Shelva Majestic, MD   Subjective   Breathing "Better than I did since February."  Inpatient Medications    Scheduled Meds: . cholecalciferol  1,000 Units Oral QHS  . digoxin  0.125 mg Oral QHS  . docusate sodium  200 mg Oral QHS  . escitalopram  10 mg Oral QPM  . ezetimibe  10 mg Oral QHS  . fenofibrate  160 mg Oral Daily  . insulin aspart  0-5 Units Subcutaneous QHS  . insulin aspart  0-9 Units Subcutaneous TID WC  . insulin glargine  25 Units Subcutaneous BID  . metoprolol succinate  25 mg Oral BID  . mometasone-formoterol  2 puff Inhalation BID  . omega-3 acid ethyl esters  1 g Oral Daily  . potassium chloride  20 mEq Oral BID  . sodium chloride flush  3 mL Intravenous Q12H  . vitamin C  1,000 mg Oral QHS  . warfarin  7.5 mg Oral ONCE-1800  . Warfarin - Pharmacist Dosing Inpatient   Does not apply q1800   Continuous Infusions: . sodium chloride     PRN Meds: sodium chloride, acetaminophen, albuterol, ALPRAZolam, ondansetron (ZOFRAN) IV, sodium chloride flush, zolpidem   Vital Signs    Vitals:   04/11/19 2040 04/11/19 2138 04/12/19 0500 04/12/19 0735  BP: (!) 149/52 133/83 137/71   Pulse: 72 70 99 (!) 110  Resp: 17  20   Temp: 97.9 F (36.6 C)  (!) 97.5 F (36.4 C) (!) 97.5 F (36.4 C)  TempSrc: Oral  Axillary Oral  SpO2: 96%  97%   Weight:   115.8 kg   Height:        Intake/Output Summary (Last 24 hours) at 04/12/2019 0958 Last data filed at 04/12/2019 0915 Gross per 24 hour  Intake 1591 ml  Output 2650 ml  Net -1059 ml   Filed Weights   04/10/19 0607 04/11/19 0500 04/12/19 0500  Weight: 116.9 kg 115.1 kg 115.8 kg    Telemetry    NA - Personally Reviewed  ECG    NA - Personally Reviewed  Physical Exam   GEN: No acute distress.   Neck: No  JVD Cardiac: IrregularRR, no murmurs, rubs, or gallops.  Respiratory: Clear  to auscultation  bilaterally. GI: Soft, nontender, non-distended  MS: Trace edema; No deformity. Neuro:  Nonfocal  Psych: Normal affect   Labs    Chemistry Recent Labs  Lab 04/10/19 0153 04/11/19 1021 04/12/19 0347  NA 138 138 136  K 4.5 4.8 3.7  CL 98 95* 95*  CO2 29 29 29   GLUCOSE 122* 238* 148*  BUN 42* 43* 39*  CREATININE 2.08* 1.85* 1.78*  CALCIUM 9.4 9.4 9.2  GFRNONAA 32* 36* 38*  GFRAA 37* 42* 44*  ANIONGAP 11 14 12      Hematology Recent Labs  Lab 04/10/19 0153 04/11/19 1021 04/12/19 0347  WBC 9.2 9.4 9.6  RBC 5.01 5.24 4.89  HGB 13.6 14.3 13.3  HCT 42.4 44.5 41.1  MCV 84.6 84.9 84.0  MCH 27.1 27.3 27.2  MCHC 32.1 32.1 32.4  RDW 17.0* 17.0* 16.8*  PLT 194 192 185    Cardiac Enzymes Recent Labs  Lab 04/09/19 1536 04/11/19 1100  TROPONINI 0.14* 0.10*   No results for input(s): TROPIPOC in the last 168 hours.   BNP Recent Labs  Lab 04/09/19 1317  BNP 222.9*     DDimer No results for  input(s): DDIMER in the last 168 hours.   Radiology    No results found.  Cardiac Studies    ECHO 04/11/19    1. The left ventricle has mildly reduced systolic function, with an ejection fraction of 45-50%. The cavity size was normal. There is moderately increased left ventricular wall thickness. Left ventricular diastolic function could not be evaluated  secondary to atrial fibrillation. Elevated left atrial and left ventricular end-diastolic pressures. 2. The right ventricle has mildly reduced systolic function. The cavity was moderately enlarged. There is no increase in right ventricular wall thickness. 3. Left atrial size was severely dilated. 4. Right atrial size was severely dilated. 5. The mitral valve is abnormal. Mild thickening of the mitral valve leaflet. Mild calcification of the mitral valve leaflet. There is mild mitral annular calcification present. Mitral valve regurgitation is mild to moderate by color flow Doppler.  Mild-moderate mitral valve stenosis.  6. The tricuspid valve is grossly normal. 7. Moderate calcification of the aortic valve. Aortic valve regurgitation is mild by color flow Doppler. Moderate stenosis of the aortic valve. 8. The inferior vena cava was dilated in size with <50% respiratory variability.   Patient Profile     68 y.o. male w/ hx permanent a fib, HTN, IDDM-2, s/p St Jude mechanical AVR in 3086, combined systolic and diastolic HF, OSA on CPAP, asthmatic COPD and CRI-3whowas admitted 05/7for acute CHF  Assessment & Plan    ACUTE COMBINED SYSTOLIC HF:   Euvolemic.  Send home on 40 mg daily Lasix with 40 PRN two lb weight gain.    ELEVATED TROPONIN:    Troponin trended down slightly.   Follow with Dr. Claiborne Billings 7 day [facility] virtual visit.  Discuss any need for follow up of this.    CKD III:    Creat is coming down.   ATRIAL FIB:  Permanent.  Continue warfarin.   AI/AS:  Mild on echo.     For questions or updates, please contact [facility] Please consult [email or web address] for contact info under Cardiology/STEMI.   Signed, Minus Breeding, MD  04/12/2019, 9:58 AM

## 2019-04-12 NOTE — Progress Notes (Signed)
Via wheelchair to car where wife was waiting

## 2019-04-12 NOTE — Discharge Instructions (Signed)
One of your heart tests showed weakness of the heart muscle this admission. This may make you more susceptible to weight gain from fluid retention, which can lead to symptoms that we call heart failure. Please follow these special instructions:   1. Follow a low-salt diet - you are allowed no more than 2,000mg  of sodium per day. Watch your fluid intake. In general, you should not be taking in more than 2 liters of fluid per day (no more than 8 glasses per day). This includes sources of water in foods like soup, coffee, tea, milk, etc. 2. Weigh yourself on the same scale at same time of day and keep a log. 3. Call your doctor: (Anytime you feel any of the following symptoms)  - 3lb weight gain overnight or 5lb within a few days. Can take extra lasix dose for weight increase of 2 pounds or more.  - Shortness of breath, with or without a dry hacking cough  - Swelling in the hands, feet or stomach  - If you have to sleep on extra pillows at night in order to breathe  ==================================================================================================  YOUR CARDIOLOGY TEAM HAS ARRANGED FOR AN E-VISIT FOR YOUR APPOINTMENT - PLEASE REVIEW IMPORTANT INFORMATION BELOW SEVERAL DAYS PRIOR TO YOUR APPOINTMENT  Due to the recent COVID-19 pandemic, we are transitioning in-person office visits to tele-medicine visits in an effort to decrease unnecessary exposure to our patients, their families, and staff. These visits are billed to your insurance just like a normal visit is. We also encourage you to sign up for MyChart if you have not already done so. You will need a smartphone if possible. For patients that do not have this, we can still complete the visit using a regular telephone but do prefer a smartphone to enable video when possible. You may have a family member that lives with you that can help. If possible, we also ask that you have a blood pressure cuff and scale at home to measure your blood  pressure, heart rate and weight prior to your scheduled appointment. Patients with clinical needs that need an in-person evaluation and testing will still be able to come to the office if absolutely necessary. If you have any questions, feel free to call our office.  2-3 DAYS BEFORE YOUR APPOINTMENT  You will receive a telephone call from one of our [facility] team members - your caller ID may say "Unknown caller." If this is a video visit, we will walk you through how to get the video launched on your phone. We will remind you check your blood pressure, heart rate and weight prior to your scheduled appointment. If you have an Apple Watch or Kardia, please upload any pertinent ECG strips the day before or morning of your appointment to [facility]. Our staff will also make sure you have reviewed the consent and agree to move forward with your scheduled tele-health visit.     THE DAY OF YOUR APPOINTMENT  Approximately 15 minutes prior to your scheduled appointment, you will receive a telephone call from one of [facility] team - your caller ID may say "Unknown caller."  Our staff will confirm medications, vital signs for the day and any symptoms you may be experiencing. Please have this information available prior to the time of visit start. It may also be helpful for you to have a pad of paper and pen handy for any instructions given during your visit. They will also walk you through joining the smartphone meeting if this is a  video visit.    CONSENT FOR TELE-HEALTH VISIT - PLEASE REVIEW  I hereby voluntarily request, consent and authorize [facility] and its employed or contracted physicians, physician assistants, nurse practitioners or other licensed health care professionals (the Practitioner), to provide me with telemedicine health care services (the Services") as deemed necessary by the treating Practitioner. I acknowledge and consent to receive the Services by the Practitioner via telemedicine. I  understand that the telemedicine visit will involve communicating with the Practitioner through live audiovisual communication technology and the disclosure of certain medical information by electronic transmission. I acknowledge that I have been given the opportunity to request an in-person assessment or other available alternative prior to the telemedicine visit and am voluntarily participating in the telemedicine visit.  I understand that I have the right to withhold or withdraw my consent to the use of telemedicine in the course of my care at any time, without affecting my right to future care or treatment, and that the Practitioner or I may terminate the telemedicine visit at any time. I understand that I have the right to inspect all information obtained and/or recorded in the course of the telemedicine visit and may receive copies of available information for a reasonable fee.  I understand that some of the potential risks of receiving the Services via telemedicine include:   Delay or interruption in medical evaluation due to technological equipment failure or disruption;  Information transmitted may not be sufficient (e.g. poor resolution of images) to allow for appropriate medical decision making by the Practitioner; and/or   In rare instances, security protocols could fail, causing a breach of personal health information.  Furthermore, I acknowledge that it is my responsibility to provide information about my medical history, conditions and care that is complete and accurate to the best of my ability. I acknowledge that Practitioner's advice, recommendations, and/or decision may be based on factors not within their control, such as incomplete or inaccurate data provided by me or distortions of diagnostic images or specimens that may result from electronic transmissions. I understand that the practice of medicine is not an exact science and that Practitioner makes no warranties or guarantees  regarding treatment outcomes. I acknowledge that I will receive a copy of this consent concurrently upon execution via email to the email address I last provided but may also request a printed copy by calling the office of [facility].    I understand that my insurance will be billed for this visit.   I have read or had this consent read to me.  I understand the contents of this consent, which adequately explains the benefits and risks of the Services being provided via telemedicine.   I have been provided ample opportunity to ask questions regarding this consent and the Services and have had my questions answered to my satisfaction.  I give my informed consent for the services to be provided through the use of telemedicine in my medical care  By participating in this telemedicine visit I agree to the above. IT IS IMPORTANT TO LET YOUR DOCTOR KNOW EARLY ON IF YOU ARE HAVING SYMPTOMS SO WE CAN HELP YOU!

## 2019-04-12 NOTE — Plan of Care (Signed)

## 2019-04-13 ENCOUNTER — Telehealth: Payer: Self-pay | Admitting: Cardiovascular Disease

## 2019-04-13 NOTE — Telephone Encounter (Signed)
New Message   Patient has an appt on 5/18 with Dr. Claiborne Billings that Pecolia Ades would like to be treated as a TOC.

## 2019-04-13 NOTE — Telephone Encounter (Signed)
Left message to call back  

## 2019-04-13 NOTE — Telephone Encounter (Signed)
Follow  Up ° ° ° ° °Pt is returning call ° ° ° °Please call back  °

## 2019-04-13 NOTE — Telephone Encounter (Signed)
TOC- attempt x 1. Left message to call back. 

## 2019-04-14 ENCOUNTER — Telehealth: Payer: Self-pay | Admitting: Cardiovascular Disease

## 2019-04-14 NOTE — Telephone Encounter (Signed)
Called patient to verify if he would be okay with doing virtual visits- and not in office at this time. Patient verbalized understanding.    Patient states he would like to get the O2 on CPAP now rather than waiting. I advised I would route a message to our sleep coordinator as I was unsure of how we go about doing this. Message also route to [facility] previously

## 2019-04-14 NOTE — Telephone Encounter (Signed)
Patient contacted regarding discharge from [facility] 04/12/19.  Patient understands to follow up with provider DR Claiborne Billings on 04/20/19 at \\11 :00 AM at [facility]. Patient understands discharge instructions? \YES Patient understands medications and regiment? YES Patient understands to bring all medications to this visit? YES PER PT AND PT'S WIFE PT HAD SOME SWELLING TO L FOOT YESTERDAY AND HAD TAKEN AN EXTRA FUROSEMIDE  AND ALSO IS REQUESTING TO GET O2 ON CPAP MACHINE  WILL FORWARD TO DR Claiborne Billings FOR REVIEW

## 2019-04-14 NOTE — Telephone Encounter (Signed)
LVM for patient for preference of video or phone visit.

## 2019-04-16 NOTE — Telephone Encounter (Signed)
Spoke with Dr.Kelly- we will need to speak to the patient at his visit before this order can be given.   Appointment 05/18

## 2019-04-20 ENCOUNTER — Telehealth (INDEPENDENT_AMBULATORY_CARE_PROVIDER_SITE_OTHER): Payer: Medicare Other | Admitting: Cardiovascular Disease

## 2019-04-20 VITALS — BP 138/56 | HR 73 | Temp 97.7°F | Ht 71.0 in | Wt 252.0 lb

## 2019-04-20 DIAGNOSIS — I10 Essential (primary) hypertension: Secondary | ICD-10-CM | POA: Diagnosis not present

## 2019-04-20 DIAGNOSIS — E782 Mixed hyperlipidemia: Secondary | ICD-10-CM

## 2019-04-20 DIAGNOSIS — N183 Chronic kidney disease, stage 3 unspecified: Secondary | ICD-10-CM

## 2019-04-20 DIAGNOSIS — E785 Hyperlipidemia, unspecified: Secondary | ICD-10-CM

## 2019-04-20 DIAGNOSIS — Z7901 Long term (current) use of anticoagulants: Secondary | ICD-10-CM

## 2019-04-20 DIAGNOSIS — Z79899 Other long term (current) drug therapy: Secondary | ICD-10-CM

## 2019-04-20 DIAGNOSIS — Z952 Presence of prosthetic heart valve: Secondary | ICD-10-CM

## 2019-04-20 DIAGNOSIS — G4733 Obstructive sleep apnea (adult) (pediatric): Secondary | ICD-10-CM

## 2019-04-20 DIAGNOSIS — Z9989 Dependence on other enabling machines and devices: Secondary | ICD-10-CM | POA: Diagnosis not present

## 2019-04-20 DIAGNOSIS — I5023 Acute on chronic systolic (congestive) heart failure: Secondary | ICD-10-CM | POA: Diagnosis not present

## 2019-04-20 DIAGNOSIS — E1122 Type 2 diabetes mellitus with diabetic chronic kidney disease: Secondary | ICD-10-CM

## 2019-04-20 DIAGNOSIS — I4821 Permanent atrial fibrillation: Secondary | ICD-10-CM | POA: Diagnosis not present

## 2019-04-20 DIAGNOSIS — Z794 Long term (current) use of insulin: Secondary | ICD-10-CM

## 2019-04-20 MED ORDER — EMPAGLIFLOZIN 25 MG PO TABS: 25.0000 mg | ORAL_TABLET | Freq: Every day | ORAL | 1 refills | Status: DC

## 2019-04-20 MED ORDER — ICOSAPENT ETHYL 1 G PO CAPS: 2.0000 g | ORAL_CAPSULE | Freq: Two times a day (BID) | ORAL | 3 refills | Status: AC

## 2019-04-20 NOTE — Patient Instructions (Addendum)
Medication Instructions:  The current medical regimen is effective;  continue present plan and medications.  If you need a refill on your cardiac medications before your next appointment, please call your pharmacy.   Lab work: CMET, LIPID in 4 weeks. If you have labs (blood work) drawn today and your tests are completely normal, you will receive your results only by: [name] MyChart Message (if you have MyChart) OR . A paper copy in the mail If you have any lab test that is abnormal or we need to change your treatment, we will call you to review the results.  Follow-Up: At [facility], you and your health needs are our priority.  As part of our continuing mission to provide you with exceptional heart care, we have created designated Provider Care Teams.  These Care Teams include your primary Cardiologist (physician) and Advanced Practice Providers (APPs -  Physician Assistants and Nurse Practitioners) who all work together to provide you with the care you need, when you need it. You will need a follow up appointment in 6 weeks. You may see Shelva Majestic, MD or one of the following Advanced Practice Providers on your designated Care Team: [place], [place] . Fabian Sharp, PA-C  Any Other Special Instructions Will Be Listed Below (If Applicable). Will send to have o2 order sent to [facility].

## 2019-04-20 NOTE — Progress Notes (Signed)
Virtual Visit via Video Note   This visit type was conducted due to national recommendations for restrictions regarding the COVID-19 Pandemic (e.g. social distancing) in an effort to limit this patient's exposure and mitigate transmission in our community.  Due to his co-morbid illnesses, this patient is at least at moderate risk for complications without adequate follow up.  This format is felt to be most appropriate for this patient at this time.  All issues noted in this document were discussed and addressed.  A limited physical exam was performed with this format.  Please refer to the patient's chart for his consent to telehealth for [facility].   Date:  04/20/2019   ID:  [name], DOB 07-Jun-1951, MRN [id number]  Patient Location: Home Provider Location: Home  PCP:  Orpah Melter, MD  Cardiologist:  Shelva Majestic, MD  Electrophysiologist:  None   Evaluation Performed:  Follow-Up Visit  Chief Complaint:  Initial hospital F/U  History of Present Illness:    Joseph Hoffman is a 68 y.o. male who has a history of permanent atrial fibrillation dating back to 33. He has a history of type 2 diabetes mellitus. He developed progressive aortic valve stenosis and 09/29/2012 underwent St. Jude aortic valve replacement by [name]. Additional problems also include hypertension, obstructive sleep apnea on CPAP and in 2012 he obtained a new CPAP machine.  He had some transient CHF symptoms after his Lasix had been held following his surgery. He sees Dr. Wilson Singer for his diabetes.   An echo Doppler study in October 2013 following his surgery showed an EF of 55-60% and a St. Jude aortic valve was well seated with peak and mean gradients of 30 and 22 mm.  There was no mention of aortic insufficiency.  When I saw him, his weight had been fairly stable  but he's been unable to lose additional weight.  AF, rate was controlled.  He had noted some intermittent ankle edema.  Since I last saw 3  years ago he retired in December 2016.  He was seen in July 2018 by Melvyn Neth for preoperative clearance prior to undergoing colonoscopy.  He has been on Coumadin both for atrial fibrillation as well as his mechanical valve.  He was  hospitalized overnight on September 11 and discharged on 08/14/2017 at [facility] in [place]. I reviewed these records.  He presented with acute on chronic CHF exacerbation and received IV Lasix.  His troponin was mildly increased secondary to acute onset CHF exacerbation.  His blood sugar was elevated.  He was discharged following day.  NTproBNP was markedly elevated at 1574. He underwent an echo Doppler study which showed an EF of 50-55%.  The mitral valve leaflets were calcified and there was 2+ MR.  His prosthetic aortic valve was not well visualized and there was 2+ moderate aortic regurgitation and moderate tricuspid regurgitation.  He was in atrial fibrillation.    After not seeing him in over 3 years, I saw him for evaluation in 2018 following his hospitalization.  He has permanent atrial fibrillation and I recommended initiation of Toprol-XL will 25 mg daily for improved rate control and blood pressure control.  With his stable renal function.  I felt he was a good candidate for Jardiance with reference to his diabetes mellitus and particularly with his recent CHF episode and initiated therapy with 10 mg.   Since I last saw him in November 2018, he underwent a follow-up echo Doppler study on Apr 03, 2018.  This showed mild LVH with normal systolic function with an EF of 50 to 55% without regional wall motion abnormalities.  His mechanical prosthesis in the aortic valve was well-seated.  Mean aortic gradient was 26 mm with a peak gradient of 36 mm.  There is mild AR.  There was mitral annular calcification with mild MR.  He had moderate to severe biatrial enlargement.  RV size is mildly dilated.  He had seen Dr. Maudie Mercury to reestablish primary care since Dr.  Wilson Singer had retired.  No adjustments were made to his regimen.  He is now employed as a Government social research officer for [employer].  He denies any chest pain or CHF symptoms.  He has noticed significant improvement since I initiated Jardiance.  He denies any shortness of breath , palpitations, PND orthopnea.  He had undergone laboratory on Apr 03, 2018 which showed a cholesterol of 184, HDL 26, LDL 90, but triglycerides were elevated at 339.  I initiated Vascepa 2 capsules twice a day.   I  saw him in June 2019.  At that time I recommended a trial of low-dose Crestor 10 mg 2 times per week.  I also recommended further titration of Jardiance to 25 mg daily.  When I  saw him in October 2019 he admitted that he was not doing well with reference to his diet.  He had stopped taking Crestor.  He was having a lot of tomatoes and cheese.  He had seen his primary doctor and apparently there is been significant delay in him being seen by endocrinology in [place] with an initial appointment set for late March 2020.  He underwent a follow-up echo Doppler study on Apr 03, 2018 which showed normal LV function.  He had a well-seated mechanical aortic valve prosthesis with mild AR.  There was mitral annular calcification with mild MR and evidence for biatrial enlargement.  He recently had laboratory which confirmed he is not been doing well with his diet.  Total cholesterol was 213 but triglycerides had risen to 672, HDL was low at 20 and LDL could not be calculated.  His potassium was 5.5.  Glucose was 315.  Hemoglobin A1c was markedly elevated at 10.1.  I spent considerable time with him discussing reduction of potassium containing foods.  Irbesartan was held.  He was started on for Vascepa 2 capsules twice a day and since he could not tolerate Crestor, I added Zetia 10 mg to his fenofibrate.  Repeat laboratory on November 21, 2018 showed a creatinine of 1.52, potassium 5.2.  Lipid  studies were significantly improved with total  cholesterol improving from 213 down to 155, triglycerides from 672 down to 253 the LDL was still also elevated but improved at 51.  LDL was 79.  He had established endocrinologic care with Dr. Amalia Hailey at [facility] [place].    When I last saw him on December 11, 2018 he denied any chest pain or shortness of breath. His blood pressure was elevated and I suggested the addition of Toprol-XL 25 mg daily which would be also helpful for rate control of his permanent atrial fibrillation.  He continued to be on furosemide in addition to [place] and was back on Victoza and metformin after not tolerating Ozempic.  We discussed possible candidacy for PCSK9 inhibition.  Since I last saw him, he has been evaluated by Rosaria Ferries, NP and Almyra Deforest, PAC in the office and was recently hospitalized on Apr 09, 2019  after experiencing increasing heart failure symptoms, 8 lb weight gain, PND and orthopnea. A repeat echo on Apr 11, 2019 showed an EF of 45 to 50%, there was severe biatrial enlargement, mild to moderate mitral regurgitation with mitral annular calcification, mild to moderate mitral stenosis, and mild aortic insufficiency.He was diuresed with Lasix 80 mg twice a day with improvement in symptomatology.  Troponin was mildly elevated at 0.14 and trended to 0.10.  Creatinine increased to 2.08 with diuresis  and improved to 1.78 on day of discharge.  During his hospitalization he required oxygen supplementation and oxygen was bled into his nocturnal CPAP use.  Since being discharged from the hospital, he believes that he is still short of breath.  He denies any additional waking since discharge.  He does note swelling in his left foot.  He is not sleeping as well since his current CPAP device at home does not have supplemental oxygen currently bled into it.  He had noticed a marked difference in his sleep quality from when in the hospital to currently as result of the absence of supplemental nocturnal  oxygen.  He has continued to be on Jardiance 25 mg, the Cipro 2 capsules twice a day, and needs renewals.  He has had difficulty with continued constipation.  He presents for evaluation.   The patient does not have symptoms concerning for COVID-19 infection (fever, chills, cough, or new shortness of breath).    Past Medical History:  Diagnosis Date   Anxiety    Aortic stenosis    Arthritis    Asthma    Asymptomatic LV dysfunction, EF 45%, normal coronary arteries on cardiac cath 09/18/12 09/30/2012   CHF (congestive heart failure) (HCC)    Chronic anticoagulation, on Xarelto prior to admit 09/30/2012   DM (diabetes mellitus) ([place]) 09/30/2012   Hepatitis 2003   Hypertension    Myocardial infarction [facility])    Permanent atrial fibrillation, since 1994 09/30/2012   stress test 02/08/12- normal study, no significant ischemia   S/P AVR (aortic valve replacement), 09/30/2012   a. s/p mechcanical AVR in 09/2012 (on Coumadin)   Sleep apnea    uses CPAP   Past Surgical History:  Procedure Laterality Date   ABDOMINAL ANGIOGRAM  09/18/2012   Procedure: ABDOMINAL ANGIOGRAM;  Surgeon: Troy Sine, MD;  Location: [facility] CATH LAB;  Service: Cardiovascular;;   AORTIC VALVE REPLACEMENT  09/29/2012   Procedure: AORTIC VALVE REPLACEMENT (AVR);  Surgeon: Gaye Pollack, MD;  Location: [place];  Service: Open Heart Surgery;  Laterality: N/A;   ARCH AORTOGRAM  09/18/2012   Procedure: ARCH AORTOGRAM;  Surgeon: Troy Sine, MD;  Location: [facility] CATH LAB;  Service: Cardiovascular;;   CARDIAC CATHETERIZATION  09/18/12   severe calcific aortic stenosis, peak gradient 13mm, mean gradient 18mm, EF 45%   CARDIAC VALVE REPLACEMENT     AVR 09-29-12   CHOLECYSTECTOMY     FRACTURE SURGERY     LEFT AND RIGHT HEART CATHETERIZATION WITH CORONARY ANGIOGRAM N/A 09/18/2012   Procedure: LEFT AND RIGHT HEART CATHETERIZATION WITH CORONARY ANGIOGRAM;  Surgeon: Troy Sine, MD;  Location: [facility] CATH LAB;   Service: Cardiovascular;  Laterality: N/A;     Current Meds  Medication Sig   acetaminophen (TYLENOL) 325 MG tablet Take 2 tablets (650 mg total) by mouth every 4 (four) hours as needed for pain or fever.   albuterol (PROVENTIL HFA;VENTOLIN HFA) 108 (90 BASE) MCG/ACT inhaler Inhale 2 puffs into the lungs every 6 (six) hours as needed.  For shortness of breath   Ascorbic Acid (VITAMIN C) 1000 MG tablet Take 1,000 mg by mouth at bedtime.    B-D ULTRAFINE III SHORT PEN 31G X 8 MM MISC    cholecalciferol (VITAMIN D) 1000 UNITS tablet Take 1,000 Units by mouth at bedtime.    Chromium 200 MCG CAPS Take 200 mcg by mouth at bedtime.    clotrimazole-betamethasone (LOTRISONE) cream Apply 1 application topically as needed.   digoxin (LANOXIN) 0.125 MG tablet TAKE 1 TABLET EVERY DAY (Patient taking differently: Take 0.125 mg by mouth at bedtime. )   docusate sodium (COLACE) 100 MG capsule Take 200 mg by mouth at bedtime.   EASY TOUCH LANCETS 30G/TWIST MISC    empagliflozin (JARDIANCE) 25 MG TABS tablet Take 25 mg by mouth daily. (Patient taking differently: Take 25 mg by mouth at bedtime. )   escitalopram (LEXAPRO) 10 MG tablet Take 10 mg by mouth every evening.    ezetimibe (ZETIA) 10 MG tablet Take 1 tablet (10 mg total) by mouth daily. (Patient taking differently: Take 10 mg by mouth at bedtime. )   fenofibrate (TRICOR) 145 MG tablet Take 1 tablet (145 mg total) by mouth daily.   Fluticasone-Salmeterol (ADVAIR) 250-50 MCG/DOSE AEPB Inhale 1 puff into the lungs as needed (shortness of breath). For asthma related symptoms   furosemide (LASIX) 40 MG tablet Take 1 tablet (40 mg total) by mouth daily. Can take an additional tablet if weight increases by 2 lbs or more.   glimepiride (AMARYL) 2 MG tablet Take 2 mg by mouth daily with breakfast.    Glucosamine-Chondroit-Vit C-Mn (GLUCOSAMINE 1500 COMPLEX PO) Take 1,500 mg by mouth at bedtime.    Icosapent Ethyl (VASCEPA) 1 g CAPS Take 2  capsules (2 g total) by mouth 2 (two) times daily.   insulin glargine (LANTUS) 100 UNIT/ML injection Inject 45 Units into the skin 2 (two) times daily.    Liraglutide (VICTOZA [place]) Inject 1.8 mg into the skin at bedtime.    metFORMIN (GLUCOPHAGE-XR) 500 MG 24 hr tablet Take 2,000 mg by mouth at bedtime.   metoprolol succinate (TOPROL XL) 25 MG 24 hr tablet Take 1 tablet (25 mg total) by mouth daily. (Patient taking differently: Take 25 mg by mouth 2 (two) times a day. )   Multiple Vitamins-Minerals (CENTRUM SPECIALIST HEART PO) Take 1 tablet by mouth 2 (two) times daily.   omega-3 acid ethyl esters (LOVAZA) 1 g capsule Take 1 g by mouth daily.   warfarin (COUMADIN) 10 MG tablet Take 10 mg by mouth See admin instructions. Take Sun. Mon. Wed.Ludwig Clarks   warfarin (COUMADIN) 5 MG tablet Take as instructed with 10 mg. (Patient taking differently: Take 2.5 mg by mouth See admin instructions. Take with 10 mg to get a total of 12.5 for Tues, Thurs and Saturday)     Allergies:   Iohexol; Niacin and related; and Penicillins   Social History   Tobacco Use   Smoking status: Former Smoker    Packs/day: 0.25    Years: 10.00    Pack years: 2.50    Types: Cigarettes    Last attempt to quit: 09/25/1982    Years since quitting: 36.5   Smokeless tobacco: Former Systems developer  Substance Use Topics   Alcohol use: Yes    Comment: occ. beer/wine   Drug use: No     Family Hx: The patient's family history includes Diabetes in his father; Heart attack (age of onset: 53) in his brother; Hyperlipidemia (age of onset:  38) in his brother; Hypertension in his mother.  ROS:   Please see the history of present illness.    No fevers chills night sweats or cough Positive for obesity No change in vision or hearing Shortness of breath with activity Permanent atrial fibrillation, history of [facility] Jude aortic valve replacement Intermittent left ankle swelling Positive for constipation Positive for diabetes  mellitus No bleeding No headaches No new neurologic symptoms OSA on CPAP   All other systems reviewed and are negative.   Prior CV studies:   The following studies were reviewed today:  ECHO 04/10/2019 IMPRESSIONS  1. The left ventricle has mildly reduced systolic function, with an ejection fraction of 45-50%. The cavity size was normal. There is moderately increased left ventricular wall thickness. Left ventricular diastolic function could not be evaluated  secondary to atrial fibrillation. Elevated left atrial and left ventricular end-diastolic pressures.  2. The right ventricle has mildly reduced systolic function. The cavity was moderately enlarged. There is no increase in right ventricular wall thickness.  3. Left atrial size was severely dilated.  4. Right atrial size was severely dilated.  5. The mitral valve is abnormal. Mild thickening of the mitral valve leaflet. Mild calcification of the mitral valve leaflet. There is mild mitral annular calcification present. Mitral valve regurgitation is mild to moderate by color flow Doppler.  Mild-moderate mitral valve stenosis.  6. The tricuspid valve is grossly normal.  7. Moderate calcification of the aortic valve. Aortic valve regurgitation is mild by color flow Doppler. Moderate stenosis of the aortic valve.  8. The inferior vena cava was dilated in size with <50% respiratory variability.  SUMMARY   LVEF 45-50%, moderate LVH, global hypokinesis, indeterminate diastolic function with high LV filling pressure, severe biatrial enlargement, mechanical AOV with mild regurgitation, calcified mitral valve and annulus with mild to moderate MS and mild to moderate MR, mild TR, RVSP 43 mmHg, dilated IVC  Labs/Other Tests and Data Reviewed:    EKG:  An ECG dated 04/09/2019 was personally reviewed today and demonstrated:  Atrial fibrillation with ventricular rate at 72 bpm.  Nonspecific interventricular conduction delay, poor R wave  progression anteriorly  Recent Labs: 02/05/2019: ALT 34 04/09/2019: B Natriuretic Peptide 222.9; Magnesium 2.6; TSH 2.235 04/12/2019: BUN 39; Creatinine, Ser 1.78; Hemoglobin 13.3; Platelets 185; Potassium 3.7; Sodium 136   Recent Lipid Panel Lab Results  Component Value Date/Time   CHOL 156 02/05/2019 10:06 AM   CHOL 166 08/23/2014 08:53 AM   TRIG 247 (H) 02/05/2019 10:06 AM   TRIG 274 (H) 08/23/2014 08:53 AM   HDL 23 (L) 02/05/2019 10:06 AM   HDL 26 (L) 08/23/2014 08:53 AM   CHOLHDL 6.8 (H) 02/05/2019 10:06 AM   CHOLHDL 4.6 08/24/2013 10:23 AM   LDLCALC 84 02/05/2019 10:06 AM   LDLCALC 85 08/23/2014 08:53 AM    Wt Readings from Last 3 Encounters:  04/20/19 252 lb (114.3 kg)  04/12/19 255 lb 4.7 oz (115.8 kg)  04/09/19 261 lb (118.4 kg)     Objective:    Vital Signs:  BP (!) 138/56    Pulse 73    Temp 97.7 F (36.5 C)    Ht 5\' 11"  (1.803 m)    Wt 252 lb (114.3 kg)    BMI 35.15 kg/m    He is well-developed and well-nourished, moderately obese in no acute distress. Breathing was nonlabored HEENT was grossly unchanged. Thick neck No audible wheezing No tenderness to his chest with palpation or tenderness to his  abdomen Irregular irregular rhythm consistent with his permanent atrial fibrillation, rate controlled According to the patient left foot swelling No new neurologic symptoms. Normal cognition and affect  ASSESSMENT & PLAN:    1. History of aortic stenosis, status post [facility] Jude aortic valve replacement October 2003: Most recent echo Doppler reviewed.  No change in mean gradient of 26 with peak gradient of 36 compared to prior evaluation. 2. Acute on chronic systolic and diastolic heart failure: Recent hospitalization reviewed.  He required IV diuresis with furosemide 80 mg twice a day.  He ultimately was sent home on 40 mg daily.  He has continued to experience some shortness of breath and also admits to some leg swelling.  I have suggested that he can take an extra 40  mg on an as-needed basis in the afternoon depending upon symptomatology.  BNP  Was 222.9.  Will obtain follow-up chemistry profile BNP. 3. Permanent atrial fibrillation: Ventricular rate continues to be controlled on therapy with Toprol-XL 25 mg and low-dose digoxin.  May be possible to ultimately discontinue digoxin and further titrate Toprol-XL in the future. 4. Acute on chronic kidney insufficiency: Creatinine increased to 2.08 with Lasix 80 mg IV twice daily.  Creatinine improved to 1.78 at discharge.  Will recheck. 5. Type 2 diabetes mellitus: He continues to be insulin, glimepiride, Victoza and Jardiance 25 mg daily which will have heart failure benefit.  We will need to make sure renal function is stable. 6. Mixed hyperlipidemia: Currently on  Vascepa 2 capsules twice a day, zetia 10 mgin addition to fenofibrate.  Did not tolerate statin.  May be candidate for future PCSK9 inhibition if cannot reach target LDL less than 70. 7. OSA on CPAP: In the hospital he noted marked improvement in his quality of sleep with supplemental oxygen.  Throughout his hospitalization he required supplemental oxygen during the day and night.  I will notify [employer] his DME company and will prescribe 2 L supplemental oxygen to be bled into his CPAP machine. 8. Constipation: No improvement with MiraLAX, now on colace.  I have suggested follow-up with primary MD.  COVID-19 Education: The signs and symptoms of COVID-19 were discussed with the patient and how to seek care for testing (follow up with PCP or arrange E-visit).  The importance of social distancing was discussed today.  Time:   Today, I have spent 30 minutes with the patient with telehealth technology discussing the above problems.     Medication Adjustments/Labs and Tests Ordered: Current medicines are reviewed at length with the patient today.  Concerns regarding medicines are outlined above.   Tests Ordered: No orders of the defined types were placed in  this encounter.   Medication Changes: No orders of the defined types were placed in this encounter.   Disposition:  Follow up repeat laboratory with CMP, lipid panel, BNP in 4 weeks with office visit 4 to 6 weeks  Signed, Shelva Majestic, MD  04/20/2019 11:51 AM    [place]

## 2019-04-21 NOTE — Telephone Encounter (Signed)
Patient spoke with Dr.Kelly yesterday-   He would like to order 2L of O2-   1. OSA on CPAP: In the hospital he noted marked improvement in his quality of sleep with supplemental oxygen.  Throughout his hospitalization he required supplemental oxygen during the day and night.  I will notify [employer] his DME company and will prescribe 2 L supplemental oxygen to be bled into his CPAP machine.  Will route to [place]

## 2019-04-23 ENCOUNTER — Telehealth: Payer: Self-pay | Admitting: Cardiovascular Disease

## 2019-04-23 NOTE — Telephone Encounter (Signed)
Pt c/o swelling: STAT is pt has developed SOB within 24 hours  1) How much weight have you gained and in what time span?   2) If swelling, where is the swelling located? L FOOT   3) Are you currently taking a fluid pill?   4) Are you currently SOB? NO  5) Do you have a log of your daily weights (if so, list)? no  6) Have you gained 3 pounds in a day or 5 pounds in a week?   7) Have you traveled recently? NO   Pt stated he is still having issues please give him a call back

## 2019-04-23 NOTE — Telephone Encounter (Signed)
Spoke with pt who state he still has some swelling in his left foot. He report he has taken 80 mg of lasix x 2 days and wearing compression sock but swelling hasn't decreased. BP 133/70 HR 90. He also report he woke up this morning around 4:30 am feeling like he couldn't breath even with the CPAP. He report MD is aware of this episodes and reason he is requesting to add O2 to his machine at night. Will route to MD and nurse.

## 2019-04-24 NOTE — Telephone Encounter (Signed)
We are aware, working on getting the O2 added to his CPAP. Will route to wanda to check in on this.  Thank you!

## 2019-04-28 ENCOUNTER — Telehealth: Payer: Self-pay | Admitting: Cardiovascular Disease

## 2019-04-28 NOTE — Telephone Encounter (Signed)
New Message   Baker Janus from [facility] calling stating she needs to speak to a nurse about oxygen order that was placed.

## 2019-04-28 NOTE — Telephone Encounter (Signed)
Received call from [place] at [place].She stated she received a order for O2 but she needs a 6 min walk test or a cpap titration.Advised I will send message to Dr.Kelly's nurse.

## 2019-05-01 NOTE — Telephone Encounter (Signed)
Please advise- when you return on how we should proceed.

## 2019-05-05 DIAGNOSIS — L03119 Cellulitis of unspecified part of limb: Secondary | ICD-10-CM | POA: Diagnosis not present

## 2019-05-05 DIAGNOSIS — Z7901 Long term (current) use of anticoagulants: Secondary | ICD-10-CM | POA: Diagnosis not present

## 2019-05-05 DIAGNOSIS — L02619 Cutaneous abscess of unspecified foot: Secondary | ICD-10-CM | POA: Diagnosis not present

## 2019-05-05 DIAGNOSIS — I4891 Unspecified atrial fibrillation: Secondary | ICD-10-CM | POA: Diagnosis not present

## 2019-05-07 NOTE — Telephone Encounter (Signed)
He will need to have OV so his 02 sats can be documented in order to get the oxygen. He will need them recorded on room air as well as walking.

## 2019-05-08 ENCOUNTER — Telehealth: Payer: Self-pay | Admitting: Cardiovascular Disease

## 2019-05-08 DIAGNOSIS — G4733 Obstructive sleep apnea (adult) (pediatric): Secondary | ICD-10-CM

## 2019-05-08 NOTE — Telephone Encounter (Signed)
According to dr [facility] last office note he was going to order oxygen to feed into the cpap, does he need testing of 02 sats before that can be ordered?

## 2019-05-08 NOTE — Telephone Encounter (Signed)
Patient wants to know the status for the order for oxygen he states he is having a very hard time breathing.

## 2019-05-11 NOTE — Telephone Encounter (Signed)
Called and spoke with wife- advised that we need the O2 reading to order the O2 for his CPAP- patient wife states they were told they could do over night spirometry reading and they would take that, but Dr.Kelly had to do the order- I advised I would route a message to [place] and [facility] so I can get this order and if this would be sufficient for the order.

## 2019-05-11 NOTE — Telephone Encounter (Signed)
Follow up    Patient is following up on the order for oxygen. Please call.

## 2019-05-12 ENCOUNTER — Encounter (HOSPITAL_COMMUNITY): Payer: Self-pay | Admitting: Emergency Medicine

## 2019-05-12 ENCOUNTER — Telehealth: Payer: Self-pay | Admitting: Cardiovascular Disease

## 2019-05-12 ENCOUNTER — Inpatient Hospital Stay (HOSPITAL_COMMUNITY)
Admission: EM | Admit: 2019-05-12 | Discharge: 2019-05-15 | DRG: 291 | Disposition: A | Discharge status: Home / Self Care | Payer: Medicare Other | Attending: Internal Medicine | Admitting: Internal Medicine

## 2019-05-12 ENCOUNTER — Emergency Department (HOSPITAL_COMMUNITY): Payer: Medicare Other

## 2019-05-12 ENCOUNTER — Other Ambulatory Visit: Payer: Self-pay

## 2019-05-12 DIAGNOSIS — I10 Essential (primary) hypertension: Secondary | ICD-10-CM | POA: Diagnosis not present

## 2019-05-12 DIAGNOSIS — R778 Other specified abnormalities of plasma proteins: Secondary | ICD-10-CM | POA: Diagnosis present

## 2019-05-12 DIAGNOSIS — N183 Chronic kidney disease, stage 3 unspecified: Secondary | ICD-10-CM | POA: Diagnosis present

## 2019-05-12 DIAGNOSIS — I252 Old myocardial infarction: Secondary | ICD-10-CM

## 2019-05-12 DIAGNOSIS — L538 Other specified erythematous conditions: Secondary | ICD-10-CM

## 2019-05-12 DIAGNOSIS — E785 Hyperlipidemia, unspecified: Secondary | ICD-10-CM | POA: Diagnosis present

## 2019-05-12 DIAGNOSIS — R0602 Shortness of breath: Secondary | ICD-10-CM

## 2019-05-12 DIAGNOSIS — I4510 Unspecified right bundle-branch block: Secondary | ICD-10-CM | POA: Diagnosis present

## 2019-05-12 DIAGNOSIS — R7989 Other specified abnormal findings of blood chemistry: Secondary | ICD-10-CM | POA: Diagnosis not present

## 2019-05-12 DIAGNOSIS — F419 Anxiety disorder, unspecified: Secondary | ICD-10-CM | POA: Diagnosis present

## 2019-05-12 DIAGNOSIS — L97529 Non-pressure chronic ulcer of other part of left foot with unspecified severity: Secondary | ICD-10-CM | POA: Diagnosis not present

## 2019-05-12 DIAGNOSIS — E1122 Type 2 diabetes mellitus with diabetic chronic kidney disease: Secondary | ICD-10-CM | POA: Diagnosis present

## 2019-05-12 DIAGNOSIS — Z880 Allergy status to penicillin: Secondary | ICD-10-CM

## 2019-05-12 DIAGNOSIS — X58XXXA Exposure to other specified factors, initial encounter: Secondary | ICD-10-CM | POA: Diagnosis present

## 2019-05-12 DIAGNOSIS — R079 Chest pain, unspecified: Secondary | ICD-10-CM | POA: Diagnosis present

## 2019-05-12 DIAGNOSIS — I5043 Acute on chronic combined systolic (congestive) and diastolic (congestive) heart failure: Secondary | ICD-10-CM | POA: Diagnosis present

## 2019-05-12 DIAGNOSIS — Z7901 Long term (current) use of anticoagulants: Secondary | ICD-10-CM

## 2019-05-12 DIAGNOSIS — Z87891 Personal history of nicotine dependence: Secondary | ICD-10-CM

## 2019-05-12 DIAGNOSIS — I4821 Permanent atrial fibrillation: Secondary | ICD-10-CM | POA: Diagnosis not present

## 2019-05-12 DIAGNOSIS — G4733 Obstructive sleep apnea (adult) (pediatric): Secondary | ICD-10-CM | POA: Diagnosis present

## 2019-05-12 DIAGNOSIS — Z952 Presence of prosthetic heart valve: Secondary | ICD-10-CM | POA: Diagnosis not present

## 2019-05-12 DIAGNOSIS — I509 Heart failure, unspecified: Secondary | ICD-10-CM

## 2019-05-12 DIAGNOSIS — Z8349 Family history of other endocrine, nutritional and metabolic diseases: Secondary | ICD-10-CM

## 2019-05-12 DIAGNOSIS — I110 Hypertensive heart disease with heart failure: Secondary | ICD-10-CM | POA: Diagnosis not present

## 2019-05-12 DIAGNOSIS — Z9049 Acquired absence of other specified parts of digestive tract: Secondary | ICD-10-CM

## 2019-05-12 DIAGNOSIS — Z20828 Contact with and (suspected) exposure to other viral communicable diseases: Secondary | ICD-10-CM | POA: Diagnosis present

## 2019-05-12 DIAGNOSIS — Z794 Long term (current) use of insulin: Secondary | ICD-10-CM

## 2019-05-12 DIAGNOSIS — I130 Hypertensive heart and chronic kidney disease with heart failure and stage 1 through stage 4 chronic kidney disease, or unspecified chronic kidney disease: Secondary | ICD-10-CM | POA: Diagnosis not present

## 2019-05-12 DIAGNOSIS — I5023 Acute on chronic systolic (congestive) heart failure: Secondary | ICD-10-CM | POA: Diagnosis present

## 2019-05-12 DIAGNOSIS — Z79899 Other long term (current) drug therapy: Secondary | ICD-10-CM

## 2019-05-12 DIAGNOSIS — E782 Mixed hyperlipidemia: Secondary | ICD-10-CM | POA: Diagnosis present

## 2019-05-12 DIAGNOSIS — F418 Other specified anxiety disorders: Secondary | ICD-10-CM | POA: Diagnosis present

## 2019-05-12 DIAGNOSIS — Z8249 Family history of ischemic heart disease and other diseases of the circulatory system: Secondary | ICD-10-CM

## 2019-05-12 DIAGNOSIS — E1129 Type 2 diabetes mellitus with other diabetic kidney complication: Secondary | ICD-10-CM | POA: Diagnosis present

## 2019-05-12 DIAGNOSIS — I428 Other cardiomyopathies: Secondary | ICD-10-CM

## 2019-05-12 DIAGNOSIS — I248 Other forms of acute ischemic heart disease: Secondary | ICD-10-CM | POA: Diagnosis not present

## 2019-05-12 DIAGNOSIS — M7989 Other specified soft tissue disorders: Secondary | ICD-10-CM | POA: Diagnosis not present

## 2019-05-12 DIAGNOSIS — S90922A Unspecified superficial injury of left foot, initial encounter: Secondary | ICD-10-CM | POA: Diagnosis present

## 2019-05-12 DIAGNOSIS — Z888 Allergy status to other drugs, medicaments and biological substances status: Secondary | ICD-10-CM

## 2019-05-12 DIAGNOSIS — I2510 Atherosclerotic heart disease of native coronary artery without angina pectoris: Secondary | ICD-10-CM | POA: Diagnosis present

## 2019-05-12 DIAGNOSIS — Z91041 Radiographic dye allergy status: Secondary | ICD-10-CM

## 2019-05-12 LAB — BASIC METABOLIC PANEL
Anion gap: 12 (ref 5–15)
BUN: 43 mg/dL — ABNORMAL HIGH (ref 8–23)
CO2: 22 mmol/L (ref 22–32)
Calcium: 9.7 mg/dL (ref 8.9–10.3)
Chloride: 103 mmol/L (ref 98–111)
Creatinine, Ser: 2.03 mg/dL — ABNORMAL HIGH (ref 0.61–1.24)
GFR calc Af Amer: 37 mL/min — ABNORMAL LOW (ref >60)
GFR calc non Af Amer: 32 mL/min — ABNORMAL LOW (ref >60)
Glucose, Bld: 119 mg/dL — ABNORMAL HIGH (ref 70–99)
Potassium: 4.7 mmol/L (ref 3.5–5.1)
Sodium: 137 mmol/L (ref 135–145)

## 2019-05-12 LAB — CBC
HCT: 41.7 % (ref 39.0–52.0)
Hemoglobin: 13.4 g/dL (ref 13.0–17.0)
MCH: 26.6 pg (ref 26.0–34.0)
MCHC: 32.1 g/dL (ref 30.0–36.0)
MCV: 82.9 fL (ref 80.0–100.0)
Platelets: 230 10*3/uL (ref 150–400)
RBC: 5.03 MIL/uL (ref 4.22–5.81)
RDW: 17 % — ABNORMAL HIGH (ref 11.5–15.5)
WBC: 9.6 10*3/uL (ref 4.0–10.5)
nRBC: 0 % (ref 0.0–0.2)

## 2019-05-12 LAB — BRAIN NATRIURETIC PEPTIDE: B Natriuretic Peptide: 297 pg/mL — ABNORMAL HIGH (ref 0.0–100.0)

## 2019-05-12 LAB — PROTIME-INR
INR: 3.5 — ABNORMAL HIGH (ref 0.8–1.2)
Prothrombin Time: 34.9 seconds — ABNORMAL HIGH (ref 11.4–15.2)

## 2019-05-12 LAB — GLUCOSE, CAPILLARY: Glucose-Capillary: 103 mg/dL — ABNORMAL HIGH (ref 70–99)

## 2019-05-12 LAB — TROPONIN I
Troponin I: 0.13 ng/mL (ref <0.03)
Troponin I: 0.12 ng/mL (ref <0.03)

## 2019-05-12 MED ORDER — DIGOXIN 125 MCG PO TABS
0.1250 mg | ORAL_TABLET | Freq: Every day | ORAL | Status: DC
  Administered 2019-05-13 – 2019-05-15 (×3): 0.125 mg via ORAL
  Filled 2019-05-12 (×3): qty 1

## 2019-05-12 MED ORDER — SODIUM CHLORIDE 0.9% FLUSH
3.0000 mL | Freq: Once | INTRAVENOUS | Status: AC
  Administered 2019-05-12: 15:00:00 3 mL via INTRAVENOUS

## 2019-05-12 MED ORDER — MORPHINE SULFATE (PF) 2 MG/ML IV SOLN: 1.0000 mg | INTRAVENOUS | Status: DC | PRN

## 2019-05-12 MED ORDER — ONDANSETRON HCL 4 MG/2ML IJ SOLN: 4.0000 mg | Freq: Four times a day (QID) | INTRAMUSCULAR | Status: DC | PRN

## 2019-05-12 MED ORDER — ADULT MULTIVITAMIN W/MINERALS CH
ORAL_TABLET | Freq: Every day | ORAL | Status: DC
  Administered 2019-05-13 – 2019-05-15 (×3): 1 via ORAL
  Filled 2019-05-12 (×4): qty 1

## 2019-05-12 MED ORDER — FUROSEMIDE 10 MG/ML IJ SOLN
40.0000 mg | Freq: Two times a day (BID) | INTRAMUSCULAR | Status: DC
  Administered 2019-05-13 (×2): 40 mg via INTRAVENOUS
  Filled 2019-05-12 (×3): qty 4

## 2019-05-12 MED ORDER — POTASSIUM CHLORIDE CRYS ER 20 MEQ PO TBCR
20.0000 meq | EXTENDED_RELEASE_TABLET | Freq: Once | ORAL | Status: AC
  Administered 2019-05-12: 20 meq via ORAL
  Filled 2019-05-12: qty 1

## 2019-05-12 MED ORDER — MOMETASONE FURO-FORMOTEROL FUM 200-5 MCG/ACT IN AERO
2.0000 | INHALATION_SPRAY | Freq: Two times a day (BID) | RESPIRATORY_TRACT | Status: DC
  Administered 2019-05-13 – 2019-05-14 (×5): 2 via RESPIRATORY_TRACT
  Filled 2019-05-12: qty 8.8

## 2019-05-12 MED ORDER — ESCITALOPRAM OXALATE 10 MG PO TABS
10.0000 mg | ORAL_TABLET | Freq: Every evening | ORAL | Status: DC
  Administered 2019-05-13 – 2019-05-14 (×3): 10 mg via ORAL
  Filled 2019-05-12 (×3): qty 1

## 2019-05-12 MED ORDER — WARFARIN SODIUM 5 MG PO TABS: 5.0000 mg | ORAL_TABLET | Freq: Once | ORAL | Status: DC

## 2019-05-12 MED ORDER — METOPROLOL SUCCINATE ER 25 MG PO TB24
37.5000 mg | ORAL_TABLET | Freq: Two times a day (BID) | ORAL | Status: DC
  Administered 2019-05-13 – 2019-05-15 (×6): 37.5 mg via ORAL
  Filled 2019-05-12 (×7): qty 2

## 2019-05-12 MED ORDER — NITROGLYCERIN 0.4 MG SL SUBL: 0.4000 mg | SUBLINGUAL_TABLET | SUBLINGUAL | Status: DC | PRN

## 2019-05-12 MED ORDER — FENOFIBRATE 160 MG PO TABS
160.0000 mg | ORAL_TABLET | Freq: Every day | ORAL | Status: DC
  Administered 2019-05-13 – 2019-05-15 (×4): 160 mg via ORAL
  Filled 2019-05-12 (×5): qty 1

## 2019-05-12 MED ORDER — ICOSAPENT ETHYL 1 G PO CAPS: 2.0000 g | ORAL_CAPSULE | Freq: Two times a day (BID) | ORAL | Status: DC

## 2019-05-12 MED ORDER — WARFARIN - PHARMACIST DOSING INPATIENT
Freq: Every day | Status: DC
  Administered 2019-05-13 – 2019-05-14 (×2)

## 2019-05-12 MED ORDER — SODIUM CHLORIDE 0.9 % IV SOLN: 250.0000 mL | INTRAVENOUS | Status: DC | PRN

## 2019-05-12 MED ORDER — VITAMIN C 500 MG PO TABS
1000.0000 mg | ORAL_TABLET | Freq: Every day | ORAL | Status: DC
  Administered 2019-05-13 – 2019-05-15 (×3): 1000 mg via ORAL
  Filled 2019-05-12 (×3): qty 2

## 2019-05-12 MED ORDER — SODIUM CHLORIDE 0.9% FLUSH: 3.0000 mL | INTRAVENOUS | Status: DC | PRN

## 2019-05-12 MED ORDER — FUROSEMIDE 10 MG/ML IJ SOLN
40.0000 mg | Freq: Once | INTRAMUSCULAR | Status: AC
  Administered 2019-05-12: 18:00:00 40 mg via INTRAVENOUS
  Filled 2019-05-12: qty 4

## 2019-05-12 MED ORDER — EZETIMIBE 10 MG PO TABS
10.0000 mg | ORAL_TABLET | Freq: Every day | ORAL | Status: DC
  Administered 2019-05-13 – 2019-05-15 (×3): 10 mg via ORAL
  Filled 2019-05-12 (×3): qty 1

## 2019-05-12 MED ORDER — ALBUTEROL SULFATE HFA 108 (90 BASE) MCG/ACT IN AERS
2.0000 | INHALATION_SPRAY | Freq: Four times a day (QID) | RESPIRATORY_TRACT | Status: DC | PRN
  Filled 2019-05-12: qty 6.7

## 2019-05-12 MED ORDER — WARFARIN SODIUM 5 MG PO TABS
5.0000 mg | ORAL_TABLET | Freq: Once | ORAL | Status: AC
  Administered 2019-05-13: 5 mg via ORAL
  Filled 2019-05-12 (×2): qty 1

## 2019-05-12 MED ORDER — INSULIN ASPART 100 UNIT/ML ~~LOC~~ SOLN
0.0000 [IU] | Freq: Three times a day (TID) | SUBCUTANEOUS | Status: DC
  Administered 2019-05-13 (×2): 3 [IU] via SUBCUTANEOUS
  Administered 2019-05-14: 5 [IU] via SUBCUTANEOUS
  Administered 2019-05-14: 07:00:00 2 [IU] via SUBCUTANEOUS
  Administered 2019-05-14: 3 [IU] via SUBCUTANEOUS
  Administered 2019-05-15: 1 [IU] via SUBCUTANEOUS
  Administered 2019-05-15: 2 [IU] via SUBCUTANEOUS

## 2019-05-12 MED ORDER — SODIUM CHLORIDE 0.9% FLUSH
3.0000 mL | Freq: Two times a day (BID) | INTRAVENOUS | Status: DC
  Administered 2019-05-13 – 2019-05-15 (×6): 3 mL via INTRAVENOUS

## 2019-05-12 MED ORDER — ACETAMINOPHEN 325 MG PO TABS
650.0000 mg | ORAL_TABLET | ORAL | Status: DC | PRN
  Filled 2019-05-12: qty 2

## 2019-05-12 MED ORDER — SULFAMETHOXAZOLE-TRIMETHOPRIM 800-160 MG PO TABS
1.0000 | ORAL_TABLET | Freq: Two times a day (BID) | ORAL | Status: DC
  Administered 2019-05-13 – 2019-05-15 (×6): 1 via ORAL
  Filled 2019-05-12 (×8): qty 1

## 2019-05-12 MED ORDER — OMEGA-3-ACID ETHYL ESTERS 1 G PO CAPS
1.0000 g | ORAL_CAPSULE | Freq: Every day | ORAL | Status: DC
  Administered 2019-05-13 – 2019-05-15 (×4): 1 g via ORAL
  Filled 2019-05-12 (×6): qty 1

## 2019-05-12 MED ORDER — INSULIN GLARGINE 100 UNIT/ML ~~LOC~~ SOLN
30.0000 [IU] | Freq: Two times a day (BID) | SUBCUTANEOUS | Status: DC
  Administered 2019-05-13 – 2019-05-15 (×6): 30 [IU] via SUBCUTANEOUS
  Filled 2019-05-12 (×8): qty 0.3

## 2019-05-12 MED ORDER — HYDRALAZINE HCL 20 MG/ML IJ SOLN: 5.0000 mg | INTRAMUSCULAR | Status: DC | PRN

## 2019-05-12 MED ORDER — VITAMIN D 25 MCG (1000 UNIT) PO TABS
1000.0000 [IU] | ORAL_TABLET | Freq: Every day | ORAL | Status: DC
  Administered 2019-05-13 – 2019-05-15 (×3): 1000 [IU] via ORAL
  Filled 2019-05-12 (×3): qty 1

## 2019-05-12 MED ORDER — TECHNETIUM TO 99M ALBUMIN AGGREGATED
1.7500 | Freq: Once | INTRAVENOUS | Status: AC | PRN
  Administered 2019-05-12: 1.75 via INTRAVENOUS

## 2019-05-12 NOTE — ED Notes (Signed)
Pt talkative without acute distress.  States he was able to lay flat for VQ scan without feeling SOB.  Ultrasound here to do LE exam.

## 2019-05-12 NOTE — Progress Notes (Addendum)
ANTICOAGULATION CONSULT NOTE - Initial Consult  Pharmacy Consult for warfarin Indication: atrial fibrillation and mechanical AVR  Allergies  Allergen Reactions  . Iohexol Anaphylaxis  . Niacin And Related     Flushing with immediate realese  . Penicillins Other (See Comments)    Unknown.[name]  Did it involve swelling of the face/tongue/throat, SOB, or low BP? Unknown Did it involve sudden or severe rash/hives, skin peeling, or any reaction on the inside of your mouth or nose? Unknown Did you need to seek medical attention at a hospital or doctor's office? Unknown When did it last happen?Childhood If all above answers are "NO", may proceed with cephalosporin use.    Patient Measurements: Height: 5\' 11"  (180.3 cm) Weight: 253 lb (114.8 kg) IBW/kg (Calculated) : 75.3  Vital Signs: Temp: 98.4 F (36.9 C) (06/09 1424) Temp Source: Oral (06/09 1424) BP: 135/65 (06/09 2027) Pulse Rate: 71 (06/09 2027)  Labs: Recent Labs    05/12/19 1426 05/12/19 1509 05/12/19 1538 05/12/19 1846  HGB 13.4  --   --   --   HCT 41.7  --   --   --   PLT 230  --   --   --   LABPROT  --   --  34.9*  --   INR  --   --  3.5*  --   CREATININE 2.03*  --   --   --   TROPONINI  --  0.13*  --  0.12*    Estimated Creatinine Clearance: 43.6 mL/min (A) (by C-G formula based on SCr of 2.03 mg/dL (H)).   Medical History: Past Medical History:  Diagnosis Date  . Anxiety   . Aortic stenosis   . Arthritis   . Asthma   . Asymptomatic LV dysfunction, EF 45%, normal coronary arteries on cardiac cath 09/18/12 09/30/2012  . CHF (congestive heart failure) ([place])   . Chronic anticoagulation, on Xarelto prior to admit 09/30/2012  . DM (diabetes mellitus) ([place]) 09/30/2012  . Hepatitis 2003  . Hypertension   . Myocardial infarction ([place])   . Permanent atrial fibrillation, since 1994 09/30/2012   stress test 02/08/12- normal study, no significant ischemia  . S/P AVR (aortic valve  replacement), 09/30/2012   a. s/p mechcanical AVR in 09/2012 (on Coumadin)  . Sleep apnea    uses CPAP    Assessment: Joseph Hoffman is a 68yo male admitted with SOB/CP. PMH significant for CHF with EF 45-50%, permanent afib on warfarin, and hx mechanical AVR in 2013. Pharmacy consulted to dose warfarin.  INR elevated on admission 3.5 and PTA regimen 10mg  SuMWF and 12.5 TTSa. Last dose yesterday.  CBC stable on admit with hgb 13.4 and pltc 230. He was recently started on Bactrim which is likely the cause of his elevated INR.   Will give reduced dose tonight due to his INR at upper end of normal and the interaction with Bactrim.   Goal of Therapy:  INR goal 2.5-3.5 Monitor platelets by anticoagulation protocol: Yes   Plan:  Warfarin 5mg  x1 tonight Monitor daily INR   Thank you for involving pharmacy in this patient's care.  Janae Bridgeman, PharmD PGY1 Pharmacy Resident Phone: [phone] 05/12/2019 9:07 PM

## 2019-05-12 NOTE — Telephone Encounter (Signed)
Ok for overnight oximetry

## 2019-05-12 NOTE — ED Triage Notes (Signed)
Patient reports sob, chest pain and and swelling to left leg for the past few days, hx of chf- denies any recent weight gain. Denies any missed lasix doses. resp e/u, nad.

## 2019-05-12 NOTE — ED Notes (Signed)
Also notes edema noted to LLE x 10 days with wound noted on plantar surface.

## 2019-05-12 NOTE — ED Notes (Signed)
ED TO INPATIENT HANDOFF REPORT  ED Nurse Name and Phone #: Kesleigh Morson RN   S Name/Age/Gender Joseph Hoffman 68 y.o. male Room/Bed: 382N/053Z  Code Status   Code Status: Prior  Home/SNF/Other Home Patient oriented to: self, place, time and situation Is this baseline? Yes   Triage Complete: Triage complete  Chief Complaint Unable to sleep, Heart issues  Triage Note Patient reports sob, chest pain and and swelling to left leg for the past few days, hx of chf- denies any recent weight gain. Denies any missed lasix doses. resp e/u, nad.    Allergies Allergies  Allergen Reactions  . Iohexol Anaphylaxis  . Niacin And Related     Flushing with immediate realese  . Penicillins Other (See Comments)    Unknown.[name]  Did it involve swelling of the face/tongue/throat, SOB, or low BP? Unknown Did it involve sudden or severe rash/hives, skin peeling, or any reaction on the inside of your mouth or nose? Unknown Did you need to seek medical attention at a hospital or doctor's office? Unknown When did it last happen?Childhood If all above answers are "NO", may proceed with cephalosporin use.    Level of Care/Admitting Diagnosis ED Disposition    ED Disposition Condition Comment   Admit  Hospital Area: [facility] [[id number]]  Level of Care: Telemetry Cardiac [103]  I expect the patient will be discharged within 24 hours: No (not a candidate for 5C-Observation unit)  Covid Evaluation: Screening Protocol (No Symptoms)  Diagnosis: Acute on chronic systolic (congestive) heart failure [facility]) [[id number]]  Admitting Physician: Ivor Costa [[id number]]  Attending Physician: Ivor Costa [[id number]]  PT Class (Do Not Modify): Observation [104]  PT Acc Code (Do Not Modify): Observation [10022]       B Medical/Surgery History Past Medical History:  Diagnosis Date  . Anxiety   . Aortic stenosis   . Arthritis   . Asthma   . Asymptomatic LV dysfunction, EF 45%,  normal coronary arteries on cardiac cath 09/18/12 09/30/2012  . CHF (congestive heart failure) ([place])   . Chronic anticoagulation, on Xarelto prior to admit 09/30/2012  . DM (diabetes mellitus) ([place]) 09/30/2012  . Hepatitis 2003  . Hypertension   . Myocardial infarction ([place])   . Permanent atrial fibrillation, since 1994 09/30/2012   stress test 02/08/12- normal study, no significant ischemia  . S/P AVR (aortic valve replacement), 09/30/2012   a. s/p mechcanical AVR in 09/2012 (on Coumadin)  . Sleep apnea    uses CPAP   Past Surgical History:  Procedure Laterality Date  . ABDOMINAL ANGIOGRAM  09/18/2012   Procedure: ABDOMINAL ANGIOGRAM;  Surgeon: Troy Sine, MD;  Location: [facility] CATH LAB;  Service: Cardiovascular;;  . AORTIC VALVE REPLACEMENT  09/29/2012   Procedure: AORTIC VALVE REPLACEMENT (AVR);  Surgeon: Gaye Pollack, MD;  Location: [place];  Service: Open Heart Surgery;  Laterality: N/A;  . ARCH AORTOGRAM  09/18/2012   Procedure: ARCH AORTOGRAM;  Surgeon: Troy Sine, MD;  Location: [facility] CATH LAB;  Service: Cardiovascular;;  . CARDIAC CATHETERIZATION  09/18/12   severe calcific aortic stenosis, peak gradient 50mm, mean gradient 55mm, EF 45%  . CARDIAC VALVE REPLACEMENT     AVR 09-29-12  . CHOLECYSTECTOMY    . FRACTURE SURGERY    . LEFT AND RIGHT HEART CATHETERIZATION WITH CORONARY ANGIOGRAM N/A 09/18/2012   Procedure: LEFT AND RIGHT HEART CATHETERIZATION WITH CORONARY ANGIOGRAM;  Surgeon: Troy Sine, MD;  Location: [facility] CATH LAB;  Service:  Cardiovascular;  Laterality: N/A;     A IV Location/Drains/Wounds Patient Lines/Drains/Airways Status   Active Line/Drains/Airways    Name:   Placement date:   Placement time:   Site:   Days:   Peripheral IV 05/12/19 Left Forearm   05/12/19    1458    Forearm   less than 1   Peripheral IV 05/12/19 Left Forearm   05/12/19    1500    Forearm   less than 1          Intake/Output Last 24 hours  Intake/Output Summary (Last 24 hours)  at 05/12/2019 2217 Last data filed at 05/12/2019 2028 Gross per 24 hour  Intake -  Output 500 ml  Net -500 ml    Labs/Imaging Results for orders placed or performed during the hospital encounter of 05/12/19 (from the past 48 hour(s))  Basic metabolic panel     Status: Abnormal   Collection Time: 05/12/19  2:26 PM  Result Value Ref Range   Sodium 137 135 - 145 mmol/L   Potassium 4.7 3.5 - 5.1 mmol/L   Chloride 103 98 - 111 mmol/L   CO2 22 22 - 32 mmol/L   Glucose, Bld 119 (H) 70 - 99 mg/dL   BUN 43 (H) 8 - 23 mg/dL   Creatinine, Ser 2.03 (H) 0.61 - 1.24 mg/dL   Calcium 9.7 8.9 - 10.3 mg/dL   GFR calc non Af Amer 32 (L) >60 mL/min   GFR calc Af Amer 37 (L) >60 mL/min   Anion gap 12 5 - 15    Comment: Performed at [facility], [address] [place], [place] [address]  CBC     Status: Abnormal   Collection Time: 05/12/19  2:26 PM  Result Value Ref Range   WBC 9.6 4.0 - 10.5 K/uL   RBC 5.03 4.22 - 5.81 MIL/uL   Hemoglobin 13.4 13.0 - 17.0 g/dL   HCT 41.7 39.0 - 52.0 %   MCV 82.9 80.0 - 100.0 fL   MCH 26.6 26.0 - 34.0 pg   MCHC 32.1 30.0 - 36.0 g/dL   RDW 17.0 (H) 11.5 - 15.5 %   Platelets 230 150 - 400 K/uL   nRBC 0.0 0.0 - 0.2 %    Comment: Performed at [facility], [place] [address], [place], [place] [address]  Brain natriuretic peptide     Status: Abnormal   Collection Time: 05/12/19  2:26 PM  Result Value Ref Range   B Natriuretic Peptide 297.0 (H) 0.0 - 100.0 pg/mL    Comment: Performed at [facility] [address] [place], [place] [address]  Troponin I - ONCE - STAT     Status: Abnormal   Collection Time: 05/12/19  3:09 PM  Result Value Ref Range   Troponin I 0.13 (HH) <0.03 ng/mL    Comment: CRITICAL RESULT CALLED TO, READ BACK BY AND VERIFIED WITH: Leafy Half 1617 05/12/2019 WBOND Performed at [facility], [place] [address] [place], [place] [address]   Protime-INR     Status: Abnormal   Collection Time: 05/12/19  3:38 PM  Result Value  Ref Range   Prothrombin Time 34.9 (H) 11.4 - 15.2 seconds   INR 3.5 (H) 0.8 - 1.2    Comment: (NOTE) INR goal varies based on device and disease states. Performed at [facility], [place] [address] [place], [place] [address]   Troponin I - ONCE - STAT  Status: Abnormal   Collection Time: 05/12/19  6:46 PM  Result Value Ref Range   Troponin I 0.12 (HH) <0.03 ng/mL    Comment: CRITICAL VALUE NOTED.  VALUE IS CONSISTENT WITH PREVIOUSLY REPORTED AND CALLED VALUE. Performed at [facility], [place] [address], [place], [place] [address]    Dg Chest 2 View  Result Date: 05/12/2019 CLINICAL DATA:  Chest pain, shortness of breath, and leg swelling for few days, history CHF, coronary artery disease post MI, hypertension, diabetes mellitus, atrial fibrillation, prior AVR EXAM: CHEST - 2 VIEW COMPARISON:  04/09/2019 FINDINGS: Enlargement of cardiac silhouette post median sternotomy and AVR. Pulmonary vascular congestion.  No mediastinal contours normal. RIGHT pleural effusion and basilar atelectasis increased from prior study. Minimal interstitial prominence, likely representing mild chronic failure. No acute pulmonary edema or segmental consolidation. No pneumothorax. Scattered endplate spur formation thoracic spine. IMPRESSION: Enlargement of cardiac silhouette with slight pulmonary vascular congestion and minimal chronic failure. Slightly increased RIGHT pleural effusion and basilar atelectasis. Electronically Signed   By: Lavonia Dana M.D.   On: 05/12/2019 15:16   Nm Pulmonary Perfusion  Result Date: 05/12/2019 CLINICAL DATA:  Shortness of breath EXAM: NUCLEAR MEDICINE PERFUSION LUNG SCAN TECHNIQUE: Perfusion images were obtained in multiple projections after intravenous injection of radiopharmaceutical. Ventilation scans intentionally deferred if perfusion scan and chest x-ray adequate for interpretation during COVID 19 epidemic. RADIOPHARMACEUTICALS:  1.75 mCi Tc-79m MAA IV COMPARISON:  None  FINDINGS: Enlargement of cardiac silhouette. Perfusion images are otherwise normal. No segmental or subsegmental perfusion defects. Ventilation exam not performed. IMPRESSION: Normal perfusion lung scan. Electronically Signed   By: Lavonia Dana M.D.   On: 05/12/2019 17:45   Vas [place] Lower Extremity Venous (dvt) (mc And Wl 7a-7p)  Result Date: 05/12/2019  Lower Venous Study Indications: Swelling, Erythema, and SOB.  Performing Technologist: Toma Copier RVS  Examination Guidelines: A complete evaluation includes B-mode imaging, spectral Doppler, color Doppler, and power Doppler as needed of all accessible portions of each vessel. Bilateral testing is considered an integral part of a complete examination. Limited examinations for reoccurring indications may be performed as noted.  +-----+---------------+---------+-----------+----------+-------+ RIGHTCompressibilityPhasicitySpontaneityPropertiesSummary +-----+---------------+---------+-----------+----------+-------+ CFV  Full           Yes      Yes                          +-----+---------------+---------+-----------+----------+-------+ SFJ  Full                                                 +-----+---------------+---------+-----------+----------+-------+   +---------+---------------+---------+-----------+----------+-------------------+ LEFT     CompressibilityPhasicitySpontaneityPropertiesSummary             +---------+---------------+---------+-----------+----------+-------------------+ CFV      Full           Yes      Yes                                      +---------+---------------+---------+-----------+----------+-------------------+ SFJ      Full                                                             +---------+---------------+---------+-----------+----------+-------------------+  FV Prox  Full           Yes      Yes                                       +---------+---------------+---------+-----------+----------+-------------------+ FV Mid   Full                                                             +---------+---------------+---------+-----------+----------+-------------------+ FV DistalFull           Yes      Yes                                      +---------+---------------+---------+-----------+----------+-------------------+ PFV      Full           Yes      Yes                                      +---------+---------------+---------+-----------+----------+-------------------+ POP      Full           Yes      Yes                                      +---------+---------------+---------+-----------+----------+-------------------+ PTV      Full                                                             +---------+---------------+---------+-----------+----------+-------------------+ PERO     Full                                         Difficult to image                                                        due to the                                                                tightness of the  calf                +---------+---------------+---------+-----------+----------+-------------------+   Left Technical Findings: Doppler waveform are pulsitile throughout consistent with a possible fluid overload.   Summary: Right: There is no evdence of a common femoral vein obstruction Left: There is no evidence of deep vein thrombosis in the lower extremity. There is no evdence of a popliteal cystic structure. See technical findings listed above.  *See table(s) above for measurements and observations.    Preliminary     Pending Labs Unresulted Labs (From admission, onward)    Start     Ordered   05/13/19 0500  Protime-INR  Daily,   R     05/12/19 2129   05/12/19 2006  Novel Coronavirus, NAA (hospital order; send-out to ref lab)  Once,    R    Comments:  No isolation needed for this testing (if isolation ordered for another indication, maintain current isolation).   Question:  Required for discharge to:  Answer:  SNF/facility placement   05/12/19 2005   Signed and Held  Hemoglobin A1c  Tomorrow morning,   R     Signed and Held   Signed and Held  Lipid panel  Tomorrow morning,   R    Comments:  Please obtain as a fasting lipid panel - should not have eaten/ drank food for 8 hours prior to labs.    Signed and Held   Signed and Held  Troponin I - Now Then Q6H  Now then every 6 hours,   R     Signed and Held   Signed and Held  Basic metabolic panel  Daily,   R     Signed and Held          Vitals/Pain Today's Vitals   05/12/19 1822 05/12/19 1900 05/12/19 2027 05/12/19 2107  BP:  (!) 169/83 135/65   Pulse:  93 71   Resp:  (!) 27    Temp:      TempSrc:      SpO2:  97% 94%   Weight: 114.8 kg     Height: 5\' 11"  (1.803 m)     PainSc:    0-No pain    Isolation Precautions No active isolations  Medications Medications  furosemide (LASIX) injection 40 mg (has no administration in time range)  acetaminophen (TYLENOL) tablet 650 mg (has no administration in time range)  sulfamethoxazole-trimethoprim (BACTRIM DS) 800-160 MG per tablet 1 tablet (has no administration in time range)  digoxin (LANOXIN) tablet 0.125 mg (has no administration in time range)  ezetimibe (ZETIA) tablet 10 mg (has no administration in time range)  fenofibrate tablet 160 mg (has no administration in time range)  Icosapent Ethyl CAPS 2 g (has no administration in time range)  metoprolol succinate (TOPROL-XL) 24 hr tablet 37.5 mg (has no administration in time range)  omega-3 acid ethyl esters (LOVAZA) capsule 1 g (has no administration in time range)  escitalopram (LEXAPRO) tablet 10 mg (has no administration in time range)  insulin glargine (LANTUS) injection 30 Units (has no administration in time range)  vitamin C (ASCORBIC ACID) tablet  1,000 mg (has no administration in time range)  cholecalciferol (VITAMIN D) tablet 1,000 Units (has no administration in time range)  [facility] Heart TABS (has no administration in time range)  albuterol (VENTOLIN HFA) 108 (90 Base) MCG/ACT inhaler 2 puff (has no administration in time range)  mometasone-formoterol (DULERA) 200-5 MCG/ACT inhaler 2 puff (has no administration in time  range)  Warfarin - Pharmacist Dosing Inpatient (has no administration in time range)  warfarin (COUMADIN) tablet 5 mg (has no administration in time range)  sodium chloride flush (NS) 0.9 % injection 3 mL (3 mLs Intravenous Given 05/12/19 1459)  technetium albumin aggregated (MAA) injection solution 6.60 millicurie (6.30 millicuries Intravenous Contrast Given 05/12/19 1742)  furosemide (LASIX) injection 40 mg (40 mg Intravenous Given 05/12/19 1825)  potassium chloride SA (K-DUR) CR tablet 20 mEq (20 mEq Oral Given 05/12/19 1825)    Mobility walks with device Low fall risk   Focused Assessments cardiac   R Recommendations: See Admitting Provider Note  Report given to:   Additional Notes:  Pt is alert and oriented, left leg swelling, no leg pain, pt alert and oriented

## 2019-05-12 NOTE — ED Provider Notes (Signed)
[facility] EMERGENCY DEPARTMENT Provider Note   CSN: [id number] Arrival date & time: 05/12/19  1414    History   Chief Complaint Chief Complaint  Patient presents with  . Shortness of Breath  . Chest Pain    HPI Joseph Hoffman is a 68 y.o. male w/ a hx of CHF last EF 45-50%, permanent afib on chronic anticoagulation w/ warfain, DM, asthma, HTN, CAD, sleep apnea on CPAP, s/p mechanical aortic valve replacement in 2013, & CKD who presents to the ED w/ complaints of progressively worsening dyspnea x 1 week. Dyspnea is constant, worse w/ exertion & supine position- has started to have to sleep upright in a recliner secondary to orthopnea. No alleviating factors. Reports he has had associated non painful LLE edema x 1 week as well as some chest discomfort that occurred this AM and lasted for several hours. Pain this AM was located to the L lower chest, sharp/stabbing/pressure- worse w/ deep breath & exertion, no alleviating factors, spontaneously resolved & has not re-occurred, no chest pain at present. Denies fever, chills, cough, leg pain, hemoptysis, recent long travel/surgery, hormone use, personal hx of cancer, or hx of DVT/PE. Reports recent hospital admission for CHF 04/09/2019. He states this feels somewhat different from his prior CHF issues. Has been compliant w/ hx warfarin & lasix.      HPI  Past Medical History:  Diagnosis Date  . Anxiety   . Aortic stenosis   . Arthritis   . Asthma   . Asymptomatic LV dysfunction, EF 45%, normal coronary arteries on cardiac cath 09/18/12 09/30/2012  . CHF (congestive heart failure) ([place])   . Chronic anticoagulation, on Xarelto prior to admit 09/30/2012  . DM (diabetes mellitus) ([employer]) 09/30/2012  . Hepatitis 2003  . Hypertension   . Myocardial infarction ([place])   . Permanent atrial fibrillation, since 1994 09/30/2012   stress test 02/08/12- normal study, no significant ischemia  . S/P AVR (aortic valve replacement),  09/30/2012   a. s/p mechcanical AVR in 09/2012 (on Coumadin)  . Sleep apnea    uses CPAP    Patient Active Problem List   Diagnosis Date Noted  . CRI (chronic renal insufficiency), stage 3 (moderate) ([place]) 04/09/2019  . Acute combined systolic and diastolic heart failure ([place]) 04/09/2019  . Essential hypertension 06/03/2017  . Dyslipidemia, goal LDL below 70 04/11/2017  . AS (aortic stenosis) 10/28/2012  . Acute on chronic systolic heart failure, re-admitted 10/12/12 10/12/2012  . Nonischemic cardiomyopathy, EF 40-45% 10/12/2012  . S/P AVR, 09/29/12, [employer]. (discharged 10/05/12) 09/30/2012  . Permanent atrial fibrillation, since 1994 09/30/2012  . Type 2 IDDM 09/30/2012  . Sleep apnea, on C-pap 09/30/2012  . Normal coronary arteries at cath Oct 2013 09/30/2012  . Chronic anticoagulation, on Coumadin after [employer] AVR 09/30/2012  . Severe aortic stenosis 09/25/2012    Past Surgical History:  Procedure Laterality Date  . ABDOMINAL ANGIOGRAM  09/18/2012   Procedure: ABDOMINAL ANGIOGRAM;  Surgeon: Troy Sine, MD;  Location: [facility] CATH LAB;  Service: Cardiovascular;;  . AORTIC VALVE REPLACEMENT  09/29/2012   Procedure: AORTIC VALVE REPLACEMENT (AVR);  Surgeon: Gaye Pollack, MD;  Location: [facility];  Service: Open Heart Surgery;  Laterality: N/A;  . ARCH AORTOGRAM  09/18/2012   Procedure: ARCH AORTOGRAM;  Surgeon: Troy Sine, MD;  Location: [facility] CATH LAB;  Service: Cardiovascular;;  . CARDIAC CATHETERIZATION  09/18/12   severe calcific aortic stenosis, peak gradient 54mm, mean gradient 68mm, EF 45%  .  CARDIAC VALVE REPLACEMENT     AVR 09-29-12  . CHOLECYSTECTOMY    . FRACTURE SURGERY    . LEFT AND RIGHT HEART CATHETERIZATION WITH CORONARY ANGIOGRAM N/A 09/18/2012   Procedure: LEFT AND RIGHT HEART CATHETERIZATION WITH CORONARY ANGIOGRAM;  Surgeon: Troy Sine, MD;  Location: [facility] CATH LAB;  Service: Cardiovascular;  Laterality: N/A;        Home Medications    Prior to  Admission medications   Medication Sig Start Date End Date Taking? Authorizing Provider  acetaminophen (TYLENOL) 325 MG tablet Take 2 tablets (650 mg total) by mouth every 4 (four) hours as needed for pain or fever. 10/16/12   Erlene Quan, PA-C  albuterol (PROVENTIL HFA;VENTOLIN HFA) 108 (90 BASE) MCG/ACT inhaler Inhale 2 puffs into the lungs every 6 (six) hours as needed. For shortness of breath    [provider]  Ascorbic Acid (VITAMIN C) 1000 MG tablet Take 1,000 mg by mouth at bedtime.     [provider]  B-D ULTRAFINE III SHORT PEN 31G X 8 MM MISC  10/04/13   [provider]  cholecalciferol (VITAMIN D) 1000 UNITS tablet Take 1,000 Units by mouth at bedtime.     [provider]  Chromium 200 MCG CAPS Take 200 mcg by mouth at bedtime.     [provider]  clotrimazole-betamethasone (LOTRISONE) cream Apply 1 application topically as needed. 05/21/14   [provider]  digoxin (LANOXIN) 0.125 MG tablet TAKE 1 TABLET EVERY DAY Patient taking differently: Take 0.125 mg by mouth at bedtime.  01/21/19   Troy Sine, MD  docusate sodium (COLACE) 100 MG capsule Take 200 mg by mouth at bedtime.    [provider]  EASY TOUCH LANCETS 30G/TWIST [place]  11/05/13   [provider]  empagliflozin (JARDIANCE) 25 MG TABS tablet Take 25 mg by mouth daily. 04/20/19   Troy Sine, MD  escitalopram (LEXAPRO) 10 MG tablet Take 10 mg by mouth every evening.     [provider]  ezetimibe (ZETIA) 10 MG tablet Take 1 tablet (10 mg total) by mouth daily. Patient taking differently: Take 10 mg by mouth at bedtime.  02/09/19 05/10/19  Almyra Deforest, PA  fenofibrate (TRICOR) 145 MG tablet Take 1 tablet (145 mg total) by mouth daily. 02/09/19   Almyra Deforest, PA  Fluticasone-Salmeterol (ADVAIR) 250-50 MCG/DOSE AEPB Inhale 1 puff into the lungs as needed (shortness of breath). For asthma related symptoms    [provider]  furosemide (LASIX)  40 MG tablet Take 1 tablet (40 mg total) by mouth daily. Can take an additional tablet if weight increases by 2 lbs or more. 04/12/19   Daune Perch, NP  glimepiride (AMARYL) 2 MG tablet Take 2 mg by mouth daily with breakfast.  05/25/17   [provider]  Glucosamine-Chondroit-Vit C-Mn (GLUCOSAMINE 1500 COMPLEX PO) Take 1,500 mg by mouth at bedtime.     [provider]  Icosapent Ethyl (VASCEPA) 1 g CAPS Take 2 capsules (2 g total) by mouth 2 (two) times daily. 04/20/19   Troy Sine, MD  insulin glargine (LANTUS) 100 UNIT/ML injection Inject 45 Units into the skin 2 (two) times daily.     [provider]  Liraglutide (VICTOZA [name]) Inject 1.8 mg into the skin at bedtime.     [provider]  metFORMIN (GLUCOPHAGE-XR) 500 MG 24 hr tablet Take 2,000 mg by mouth at bedtime.    [provider]  metoprolol succinate (TOPROL  XL) 25 MG 24 hr tablet Take 1 tablet (25 mg total) by mouth daily. Patient taking differently: Take 25 mg by mouth 2 (two) times a day.  12/11/18   Troy Sine, MD  Multiple Vitamins-Minerals (CENTRUM SPECIALIST HEART PO) Take 1 tablet by mouth 2 (two) times daily.    [provider]  omega-3 acid ethyl esters (LOVAZA) 1 g capsule Take 1 g by mouth daily.    [provider]  warfarin (COUMADIN) 10 MG tablet Take 10 mg by mouth See admin instructions. Take Sun. Mon. Wed.Ludwig Clarks    [provider]  warfarin (COUMADIN) 5 MG tablet Take as instructed with 10 mg. Patient taking differently: Take 2.5 mg by mouth See admin instructions. Take with 10 mg to get a total of 12.5 for Tues, Thurs and Saturday 12/11/18   Troy Sine, MD    Family History Family History  Problem Relation Age of Onset  . Hypertension Mother   . Diabetes Father   . Heart attack Brother 67  . Hyperlipidemia Brother 6       stents placed    Social History Social History   Tobacco Use  . Smoking status: Former Smoker     Packs/day: 0.25    Years: 10.00    Pack years: 2.50    Types: Cigarettes    Last attempt to quit: 09/25/1982    Years since quitting: 36.6  . Smokeless tobacco: Former Network engineer Use Topics  . Alcohol use: Yes    Comment: occ. beer/wine  . Drug use: No     Allergies   Iohexol; Niacin and related; and Penicillins   Review of Systems Review of Systems  Constitutional: Negative for chills and fever.  Respiratory: Positive for shortness of breath. Negative for cough and wheezing.   Cardiovascular: Positive for chest pain and leg swelling. Negative for palpitations.  Gastrointestinal: Negative for abdominal pain, diarrhea, nausea and vomiting.  Musculoskeletal: Negative for myalgias.  Neurological: Negative for syncope.  All other systems reviewed and are negative.  Physical Exam Updated Vital Signs BP 132/68   Pulse 75   Temp 98.4 F (36.9 C) (Oral)   Resp (!) 185   Ht 5\' 11"  (1.803 m)   Wt 114.8 kg   SpO2 94%   BMI 35.29 kg/m   Physical Exam Vitals signs and nursing note reviewed.  Constitutional:      General: He is not in acute distress.    Appearance: He is well-developed. He is not toxic-appearing.  HENT:     Head: Normocephalic and atraumatic.  Eyes:     General:        Right eye: No discharge.        Left eye: No discharge.     Conjunctiva/sclera: Conjunctivae normal.  Neck:     Musculoskeletal: Neck supple.  Cardiovascular:     Rate and Rhythm: Normal rate. Rhythm irregular.     Pulses:          Dorsalis pedis pulses are 2+ on the right side and 2+ on the left side.       Posterior tibial pulses are 2+ on the right side and 2+ on the left side.  Pulmonary:     Effort: Tachypnea present. No respiratory distress.     Breath sounds: Decreased breath sounds (mild @ the bases) present. No wheezing, rhonchi or rales.  Abdominal:     General: There is no distension.     Palpations: Abdomen is  soft.     Tenderness: There is no abdominal tenderness.   Musculoskeletal:     Comments: LEs: Asymmetric pitting edema to the bilateral lower legs, 3+ to LLE, 1+ to RLE. No warmth to the touch or significant open wounds. Nontender to palpation. Compartments are soft.   Skin:    General: Skin is warm and dry.     Findings: No rash.  Neurological:     Mental Status: He is alert.     Comments: Clear speech.   Psychiatric:        Behavior: Behavior normal.    ED Treatments / Results  Labs (all labs ordered are listed, but only abnormal results are displayed) Labs Reviewed  BASIC METABOLIC PANEL - Abnormal; Notable for the following components:      Result Value   Glucose, Bld 119 (*)    BUN 43 (*)    Creatinine, Ser 2.03 (*)    GFR calc non Af Amer 32 (*)    GFR calc Af Amer 37 (*)    All other components within normal limits  CBC - Abnormal; Notable for the following components:   RDW 17.0 (*)    All other components within normal limits  BRAIN NATRIURETIC PEPTIDE  TROPONIN I  PROTIME-INR    EKG EKG Interpretation  Date/Time:  Tuesday May 12 2019 14:45:05 EDT Ventricular Rate:  77 PR Interval:    QRS Duration: 134 QT Interval:  428 QTC Calculation: 432 R Axis:   -96 Text Interpretation:  Atrial fibrillation Right bundle branch block Inferior infarct, old Anterior infarct, old Lateral leads are also involved No significant change since last tracing earler today Confirmed by Dorie Rank [phone]) on 05/12/2019 3:26:41 PM   Radiology Dg Chest 2 View  Result Date: 05/12/2019 CLINICAL DATA:  Chest pain, shortness of breath, and leg swelling for few days, history CHF, coronary artery disease post MI, hypertension, diabetes mellitus, atrial fibrillation, prior AVR EXAM: CHEST - 2 VIEW COMPARISON:  04/09/2019 FINDINGS: Enlargement of cardiac silhouette post median sternotomy and AVR. Pulmonary vascular congestion.  No mediastinal contours normal. RIGHT pleural effusion and basilar atelectasis increased from prior study. Minimal interstitial  prominence, likely representing mild chronic failure. No acute pulmonary edema or segmental consolidation. No pneumothorax. Scattered endplate spur formation thoracic spine. IMPRESSION: Enlargement of cardiac silhouette with slight pulmonary vascular congestion and minimal chronic failure. Slightly increased RIGHT pleural effusion and basilar atelectasis. Electronically Signed   By: Lavonia Dana M.D.   On: 05/12/2019 15:16    Procedures Procedures (including critical care time)  Medications Ordered in ED Medications  sodium chloride flush (NS) 0.9 % injection 3 mL (has no administration in time range)    Echocardiogram: 04/10/2019 IMPRESSIONS 1. The left ventricle has mildly reduced systolic function, with an ejection fraction of 45-50%. The cavity size was normal. There is moderately increased left ventricular wall thickness. Left ventricular diastolic function could not be evaluated  secondary to atrial fibrillation. Elevated left atrial and left ventricular end-diastolic pressures.  2. The right ventricle has mildly reduced systolic function. The cavity was moderately enlarged. There is no increase in right ventricular wall thickness.  3. Left atrial size was severely dilated.  4. Right atrial size was severely dilated.  5. The mitral valve is abnormal. Mild thickening of the mitral valve leaflet. Mild calcification of the mitral valve leaflet. There is mild mitral annular calcification present. Mitral valve regurgitation is mild to moderate by color flow Doppler.  Mild-moderate mitral valve stenosis.  6. The tricuspid valve is grossly normal.  7. Moderate calcification of the aortic valve. Aortic valve regurgitation is mild by color flow Doppler. Moderate stenosis of the aortic valve.  8. The inferior vena cava was dilated in size with <50% respiratory variability.   Initial Impression / Assessment and Plan / ED Course  I have reviewed the triage vital signs and the nursing notes.   Pertinent labs & imaging results that were available during my care of the patient were reviewed by me and considered in my medical decision making (see chart for details).   Patient w/ extensive past medical hx as above presents to the ED w/ dyspnea, LLE swelling, and episode of chest pain this AM. Nontoxic appearing, no apparent distress, patient is mildly tachpneic otherwise initial vitals without significant abnormality (initial documented RR of 185 likely error) mid 20s on my exam. Exam w/ some mild decreased breath sounds & asymmetric pitting edema of the LEs (L>R). DDX: DVT/PE, CHF exacerbation, ACS, pneumonia, anemia. Plan for cardiac work-up, DVT study, & VQ scan (VQ scan as opposed to CTA given contrast dye allergy). Current pain free- aware to alert staff of any change in status.     CBC: No leukocytosis/anemia.  BMP: Renal function appears within baseline ranges w/ hx of CKD. No significant electrolyte derangement. Mild hyperglycemia.  BNP: mildly elevated @ 297 Troponin: Mildly elevated @ 0.13 PT/INR: consistent w/ warfarin therapy EKG: Afib, no significant change from prior.  CXR: Enlargement of cardiac silhouette with slight pulmonary vascular congestion and minimal chronic failure. Slightly increased RIGHT pleural effusion and basilar atelectasis.   VQ scan & venous duplex negative for PE/DVT.   Suspect CHF exacerbation w/ mildly elevated BNP, trop likely elevated secondary to demand, & cardiomegaly w/ vascular congestion & pleural effusion on CXR.   Patient opted for trial of lasix & re-assessment as if possible he would like to go home. Home dose of Lasix & dose of potassium ordered w/ decent UOP.   Remained dyspneic with exertion, desat to 91% on RA w/ ambulation, 93-95% @ rest on RA. Plan to consult for admission, patient feels that would be best, not comfortable going home, Dr. Tomi Bamberger in agreement.   20:15: CONSULT: Discussed w/ hospitalist Dr. Blaine Hamper who accepts admission.    HADEN SUDER was evaluated in Emergency Department on 05/12/2019 for the symptoms described in the history of present illness. He/she was evaluated in the context of the global COVID-19 pandemic, which necessitated consideration that the patient might be at risk for infection with the SARS-CoV-2 virus that causes COVID-19. Institutional protocols and algorithms that pertain to the evaluation of patients at risk for COVID-19 are in a state of rapid change based on information released by regulatory bodies including the CDC and federal and state organizations. These policies and algorithms were followed during the patient's care in the ED.  Final Clinical Impressions(s) / ED Diagnoses   Final diagnoses:  Acute on chronic congestive heart failure, unspecified heart failure type [facility])    ED Discharge Orders    None       Leafy Kindle 05/12/19 2033    Dorie Rank, MD 05/14/19 [phone]

## 2019-05-12 NOTE — ED Notes (Signed)
Attempted report 

## 2019-05-12 NOTE — Telephone Encounter (Signed)
Patient was seen at [facility] today for a wound check. At the appt, Senita noticed the patient had SOB and low O2 Stats. Patient wanted to see Dr. Claiborne Billings before a possible  hospital admission

## 2019-05-12 NOTE — Progress Notes (Signed)
Left lower extremity venous duplex completed. Preliminary results in Chart review CV [place], [place] 05/12/2019 6:58 PM

## 2019-05-12 NOTE — H&P (Signed)
History and Physical    Joseph Hoffman STM:[id number] DOB: 1951/10/20 DOA: 05/12/2019  Referring MD/NP/PA:   PCP: Orpah Melter, MD   Patient coming from:  The patient is coming from home.  At baseline, pt is independent for most of ADL.        Chief Complaint: SOB  HPI: Joseph Hoffman is a 68 y.o. male with medical history significant of hypertension, diabetes mellitus, asthma, sCHF with EF 45%, CAD, atrial fibrillation on Coumadin, AS, s/p of AVR with mechanical valve on coumadin, OSA on CPAP, CKD stage III, depression with anxiety, who presents with shortness of breath.  Patient states that he has been having shortness of breath for about 1 week, which has been progressively worsening.  He does not have cough, fever or chills.  Patient states that he had one episode of mild chest pain in morning, which has resolved currently.  Patient reports bilateral leg edema (the left leg is worse than the right).  No nausea, vomiting, diarrhea, abdominal pain.  Patient states that he is taking Bactrim for left toe foot wound (suppose to take from 6/3 to 6/12).   ED Course: pt was found to have troponin 0 0.13, 0.012, BNP 297, INR 3.5, WBC 9.6, kidney function close to baseline, temperature normal, blood pressure 145/72, no tachycardia, oxygen saturation 95% on room air, chest x-ray showed cardiomegaly and mild vascular congestion.  VQ scan is negative for PE.  Venous Dopplers negative for DVT.  Patient is placed on telemetry bed for observation  Review of Systems:   General: no fevers, chills, no body weight gain, has fatigue HEENT: no blurry vision, hearing changes or sore throat Respiratory: has dyspnea, no coughing, wheezing CV: currently no chest pain, no palpitations GI: no nausea, vomiting, abdominal pain, diarrhea, constipation GU: no dysuria, burning on urination, increased urinary frequency, hematuria  Ext: has leg edema Neuro: no unilateral weakness, numbness, or tingling, no vision  change or hearing loss Skin: no rash, no skin tear. MSK: No muscle spasm, no deformity, no limitation of range of movement in spin Heme: No easy bruising.  Travel history: No recent long distant travel.  Allergy:  Allergies  Allergen Reactions   Iohexol Anaphylaxis   Niacin And Related     Flushing with immediate realese   Penicillins Other (See Comments)    Unknown.[name]  Did it involve swelling of the face/tongue/throat, SOB, or low BP? Unknown Did it involve sudden or severe rash/hives, skin peeling, or any reaction on the inside of your mouth or nose? Unknown Did you need to seek medical attention at a hospital or doctor's office? Unknown When did it last happen?Childhood If all above answers are NO, may proceed with cephalosporin use.    Past Medical History:  Diagnosis Date   Anxiety    Aortic stenosis    Arthritis    Asthma    Asymptomatic LV dysfunction, EF 45%, normal coronary arteries on cardiac cath 09/18/12 09/30/2012   CHF (congestive heart failure) (HCC)    Chronic anticoagulation, on Xarelto prior to admit 09/30/2012   DM (diabetes mellitus) ([place]) 09/30/2012   Hepatitis 2003   Hypertension    Myocardial infarction [facility])    Permanent atrial fibrillation, since 1994 09/30/2012   stress test 02/08/12- normal study, no significant ischemia   S/P AVR (aortic valve replacement), 09/30/2012   a. s/p mechcanical AVR in 09/2012 (on Coumadin)   Sleep apnea    uses CPAP    Past Surgical  History:  Procedure Laterality Date   ABDOMINAL ANGIOGRAM  09/18/2012   Procedure: ABDOMINAL ANGIOGRAM;  Surgeon: Troy Sine, MD;  Location: [facility] CATH LAB;  Service: Cardiovascular;;   AORTIC VALVE REPLACEMENT  09/29/2012   Procedure: AORTIC VALVE REPLACEMENT (AVR);  Surgeon: Gaye Pollack, MD;  Location: [facility];  Service: Open Heart Surgery;  Laterality: N/A;   ARCH AORTOGRAM  09/18/2012   Procedure: ARCH AORTOGRAM;  Surgeon: Troy Sine, MD;  Location: [facility] CATH LAB;  Service: Cardiovascular;;   CARDIAC CATHETERIZATION  09/18/12   severe calcific aortic stenosis, peak gradient 21mm, mean gradient 56mm, EF 45%   CARDIAC VALVE REPLACEMENT     AVR 09-29-12   CHOLECYSTECTOMY     FRACTURE SURGERY     LEFT AND RIGHT HEART CATHETERIZATION WITH CORONARY ANGIOGRAM N/A 09/18/2012   Procedure: LEFT AND RIGHT HEART CATHETERIZATION WITH CORONARY ANGIOGRAM;  Surgeon: Troy Sine, MD;  Location: [facility] C[facility] LAB;  Service: Cardiovascular;  Laterality: N/A;    Social History:  reports that he quit smoking about 36 years ago. His smoking use included cigarettes. He has a 2.50 pack-year smoking history. He has quit using smokeless tobacco. He reports current alcohol use. He reports that he does not use drugs.  Family History:  Family History  Problem Relation Age of Onset   Hypertension Mother    Diabetes Father    Heart attack Brother 52   Hyperlipidemia Brother 102       stents placed     Prior to Admission medications   Medication Sig Start Date End Date Taking? Authorizing Provider  acetaminophen (TYLENOL) 325 MG tablet Take 2 tablets (650 mg total) by mouth every 4 (four) hours as needed for pain or fever. 10/16/12  Yes Kilroy, Luke K, PA-C  albuterol (PROVENTIL HFA;VENTOLIN HFA) 108 (90 BASE) MCG/ACT inhaler Inhale 2 puffs into the lungs every 6 (six) hours as needed. For shortness of breath   Yes [provider]  Ascorbic Acid (VITAMIN C) 1000 MG tablet Take 1,000 mg by mouth at bedtime.    Yes [provider]  cholecalciferol (VITAMIN D) 1000 UNITS tablet Take 1,000 Units by mouth at bedtime.    Yes [provider]  digoxin (LANOXIN) 0.125 MG tablet TAKE 1 TABLET EVERY DAY Patient taking differently: Take 0.125 mg by mouth at bedtime.  01/21/19  Yes Troy Sine, MD  empagliflozin (JARDIANCE) 25 MG TABS tablet Take 25 mg by mouth daily. 04/20/19  Yes Troy Sine, MD  escitalopram  (LEXAPRO) 10 MG tablet Take 10 mg by mouth every evening.    Yes [provider]  ezetimibe (ZETIA) 10 MG tablet Take 1 tablet (10 mg total) by mouth daily. Patient taking differently: Take 10 mg by mouth at bedtime.  02/09/19 05/12/19 Yes Almyra Deforest, PA  fenofibrate (TRICOR) 145 MG tablet Take 1 tablet (145 mg total) by mouth daily. 02/09/19  Yes Almyra Deforest, PA  Fluticasone-Salmeterol (ADVAIR) 250-50 MCG/DOSE AEPB Inhale 1 puff into the lungs as needed (shortness of breath). For asthma related symptoms   Yes [provider]  furosemide (LASIX) 40 MG tablet Take 1 tablet (40 mg total) by mouth daily. Can take an additional tablet if weight increases by 2 lbs or more. 04/12/19  Yes Daune Perch, NP  glimepiride (AMARYL) 2 MG tablet Take 2 mg by mouth daily with breakfast.  05/25/17  Yes [provider]  Glucosamine-Chondroit-Vit C-Mn (GLUCOSAMINE 1500 COMPLEX PO) Take 1,500 mg by mouth at  bedtime.    Yes [provider]  Icosapent Ethyl (VASCEPA) 1 g CAPS Take 2 capsules (2 g total) by mouth 2 (two) times daily. 04/20/19  Yes Troy Sine, MD  insulin glargine (LANTUS) 100 UNIT/ML injection Inject 45 Units into the skin 2 (two) times daily.    Yes [provider]  Liraglutide (VICTOZA [place]) Inject 1.8 mg into the skin at bedtime.    Yes [provider]  metFORMIN (GLUCOPHAGE-XR) 500 MG 24 hr tablet Take 2,000 mg by mouth at bedtime.   Yes [provider]  metoprolol succinate (TOPROL XL) 25 MG 24 hr tablet Take 1 tablet (25 mg total) by mouth daily. Patient taking differently: Take 37.5 mg by mouth 2 (two) times a day.  12/11/18  Yes Troy Sine, MD  Multiple Vitamins-Minerals (CENTRUM SPECIALIST HEART PO) Take 1 tablet by mouth daily.    Yes [provider]  omega-3 acid ethyl esters (LOVAZA) 1 g capsule Take 1 g by mouth daily.   Yes [provider]  sulfamethoxazole-trimethoprim (BACTRIM DS) 800-160 MG tablet Take 1 tablet  by mouth 2 (two) times daily. for 10 days 05/06/19  Yes [provider]  warfarin (COUMADIN) 10 MG tablet Take 10 mg by mouth See admin instructions. Take Sun. Mon. Wed.Ludwig Clarks   Yes [provider]  warfarin (COUMADIN) 5 MG tablet Take as instructed with 10 mg. Patient taking differently: Take 2.5 mg by mouth See admin instructions. Take with 10 mg to get a total of 12.5 for Tues, Thurs and Saturday 12/11/18  Yes Troy Sine, MD  B-D ULTRAFINE III SHORT PEN 31G X 8 MM MISC  10/04/13   [provider]  EASY TOUCH LANCETS 30G/TWIST [place]  11/05/13   [provider]    Physical Exam: Vitals:   05/12/19 2226 05/12/19 2227 05/12/19 2306 05/13/19 0003  BP: 123/82  (!) 141/109   Pulse:  (!) 57 (!) 50   Resp:   18   Temp:   98 F (36.7 C)   TempSrc:   Oral   SpO2:  96% 93% 93%  Weight:    114.7 kg  Height:    5\' 11"  (1.803 m)   General: Not in acute distress HEENT:       Eyes: PERRL, EOMI, no scleral icterus.       ENT: No discharge from the ears and nose, no pharynx injection, no tonsillar enlargement.        Neck: positive JVD, no bruit, no mass felt. Heme: No neck lymph node enlargement. Cardiac: S1/S2, RRR, No murmurs, No gallops or rubs. Respiratory: No rales, wheezing, rhonchi or rubs. GI: Soft, nondistended, nontender, no rebound pain, no organomegaly, BS present. GU: No hematuria Ext: 1+ pitting leg edema (left leg is worse than the right). 2+DP/PT pulse bilaterally. Musculoskeletal: No joint deformities, No joint redness or warmth, no limitation of ROM in spin. Skin: No rashes.  Neuro: Alert, oriented X3, cranial nerves II-XII grossly intact, moves all extremities normally.   Psych: Patient is not psychotic, no suicidal or hemocidal ideation.  Labs on Admission: I have personally reviewed following labs and imaging studies  CBC: Recent Labs  Lab 05/12/19 1426  WBC 9.6  HGB 13.4  HCT 41.7  MCV 82.9  PLT 932   Basic Metabolic  Panel: Recent Labs  Lab 05/12/19 1426  NA 137  K 4.7  CL 103  CO2 22  GLUCOSE 119*  BUN 43*  CREATININE 2.03*  CALCIUM 9.7   GFR: Estimated Creatinine Clearance: 43.6 mL/min (A) (by C-G formula based on SCr of 2.03 mg/dL (H)). Liver Function Tests: No results for input(s): AST, ALT, ALKPHOS, BILITOT, PROT, ALBUMIN in the last 168 hours. No results for input(s): LIPASE, AMYLASE in the last 168 hours. No results for input(s): AMMONIA in the last 168 hours. Coagulation Profile: Recent Labs  Lab 05/12/19 1538  INR 3.5*   Cardiac Enzymes: Recent Labs  Lab 05/12/19 1509 05/12/19 1846 05/12/19 2318  TROPONINI 0.13* 0.12* 0.13*   BNP (last 3 results) No results for input(s): PROBNP in the last 8760 hours. HbA1C: No results for input(s): HGBA1C in the last 72 hours. CBG: Recent Labs  Lab 05/12/19 2321  GLUCAP 103*   Lipid Profile: No results for input(s): CHOL, HDL, LDLCALC, TRIG, CHOLHDL, LDLDIRECT in the last 72 hours. Thyroid Function Tests: No results for input(s): TSH, T4TOTAL, FREET4, T3FREE, THYROIDAB in the last 72 hours. Anemia Panel: No results for input(s): VITAMINB12, FOLATE, FERRITIN, TIBC, IRON, RETICCTPCT in the last 72 hours. Urine analysis:    Component Value Date/Time   COLORURINE YELLOW 09/25/2012 1506   APPEARANCEUR CLEAR 09/25/2012 1506   LABSPEC 1.045 (H) 09/25/2012 1506   PHURINE 6.0 09/25/2012 1506   GLUCOSEU >1000 (A) 09/25/2012 1506   HGBUR NEGATIVE 09/25/2012 1506   BILIRUBINUR NEGATIVE 09/25/2012 1506   KETONESUR NEGATIVE 09/25/2012 1506   PROTEINUR NEGATIVE 09/25/2012 1506   UROBILINOGEN 0.2 09/25/2012 1506   NITRITE NEGATIVE 09/25/2012 1506   LEUKOCYTESUR NEGATIVE 09/25/2012 1506   Sepsis Labs: @LABRCNTIP (procalcitonin:4,lacticidven:4) )No results found for this or any previous visit (from the past 240 hour(s)).   Radiological Exams on Admission: Dg Chest 2 View  Result Date: 05/12/2019 CLINICAL DATA:  Chest pain, shortness  of breath, and leg swelling for few days, history CHF, coronary artery disease post MI, hypertension, diabetes mellitus, atrial fibrillation, prior AVR EXAM: CHEST - 2 VIEW COMPARISON:  04/09/2019 FINDINGS: Enlargement of cardiac silhouette post median sternotomy and AVR. Pulmonary vascular congestion.  No mediastinal contours normal. RIGHT pleural effusion and basilar atelectasis increased from prior study. Minimal interstitial prominence, likely representing mild chronic failure. No acute pulmonary edema or segmental consolidation. No pneumothorax. Scattered endplate spur formation thoracic spine. IMPRESSION: Enlargement of cardiac silhouette with slight pulmonary vascular congestion and minimal chronic failure. Slightly increased RIGHT pleural effusion and basilar atelectasis. Electronically Signed   By: Lavonia Dana M.D.   On: 05/12/2019 15:16   Nm Pulmonary Perfusion  Result Date: 05/12/2019 CLINICAL DATA:  Shortness of breath EXAM: NUCLEAR MEDICINE PERFUSION LUNG SCAN TECHNIQUE: Perfusion images were obtained in multiple projections after intravenous injection of radiopharmaceutical. Ventilation scans intentionally deferred if perfusion scan and chest x-ray adequate for interpretation during COVID 19 epidemic. RADIOPHARMACEUTICALS:  1.75 mCi Tc-72m MAA IV COMPARISON:  None FINDINGS: Enlargement of cardiac silhouette. Perfusion images are otherwise normal. No segmental or subsegmental perfusion defects. Ventilation exam not performed. IMPRESSION: Normal perfusion lung scan. Electronically Signed   By: Lavonia Dana M.D.   On: 05/12/2019 17:45   Vas [place] Lower Extremity Venous (dvt) (mc And Wl 7a-7p)  Result Date: 05/12/2019  Lower Venous Study Indications: Swelling, Erythema, and SOB.  Performing Technologist: Toma Copier RVS  Examination Guidelines: A complete evaluation includes B-mode imaging, spectral Doppler, color Doppler, and power Doppler as needed of all accessible portions of each vessel.  Bilateral testing is considered an integral part of a complete examination. Limited examinations for reoccurring indications may be performed as noted.  +-----+---------------+---------+-----------+----------+-------+  RIGHT Compressibility Phasicity Spontaneity Properties Summary  +-----+---------------+---------+-----------+----------+-------+  CFV   Full            Yes       Yes                             +-----+---------------+---------+-----------+----------+-------+  SFJ   Full                                                      +-----+---------------+---------+-----------+----------+-------+   +---------+---------------+---------+-----------+----------+-------------------+  LEFT      Compressibility Phasicity Spontaneity Properties Summary              +---------+---------------+---------+-----------+----------+-------------------+  CFV       Full            Yes       Yes                                         +---------+---------------+---------+-----------+----------+-------------------+  SFJ       Full                                                                  +---------+---------------+---------+-----------+----------+-------------------+  FV Prox   Full            Yes       Yes                                         +---------+---------------+---------+-----------+----------+-------------------+  FV Mid    Full                                                                  +---------+---------------+---------+-----------+----------+-------------------+  FV Distal Full            Yes       Yes                                         +---------+---------------+---------+-----------+----------+-------------------+  PFV       Full            Yes       Yes                                         +---------+---------------+---------+-----------+----------+-------------------+  POP       Full            Yes       Yes                                          +---------+---------------+---------+-----------+----------+-------------------+  PTV       Full                                                                  +---------+---------------+---------+-----------+----------+-------------------+  PERO      Full                                             Difficult to image                                                               due to the                                                                       tightness of the                                                                 calf                 +---------+---------------+---------+-----------+----------+-------------------+   Left Technical Findings: Doppler waveform are pulsitile throughout consistent with a possible fluid overload.   Summary: Right: There is no evdence of a common femoral vein obstruction Left: There is no evidence of deep vein thrombosis in the lower extremity. There is no evdence of a popliteal cystic structure. See technical findings listed above.  *See table(s) above for measurements and observations.    Preliminary      EKG: Independently reviewed.  Atrial fibrillation, QTc 471, PVC, poor R wave progression.  Assessment/Plan Principal Problem:   Acute on chronic systolic (congestive) heart failure (HCC) Active Problems:   S/P AVR, 09/29/12, [employer]. (discharged 10/05/12)   Permanent atrial fibrillation, since 1994   Sleep apnea, on C-pap   Chronic anticoagulation, on Coumadin after [employer] AVR   Dyslipidemia, goal LDL below 70   Essential hypertension   Type II diabetes mellitus with renal manifestations (HCC)   Chest pain   Elevated troponin   CKD (chronic kidney disease), stage III ([place])   Acute on chronic systolic heart failure: 2D echo on 04/10/2019 showed EF of 45-50%.  Patient has elevated BNP, vascular congestion on chest x-ray, bilateral leg edema, consistent with CHF exacerbation.  VQ scan negative for PE.  Venous Dopplers negative for  DVT.  -will place in tele bed for obs -will treat with IV lasix 40 mg bid -Will not repeat 2-D echo (just had one on 04/10/19) -Daily weights and strict I/O's -Low  salt diet  S/P AVR, 09/29/12, [employer]. 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 On coumadin. INR 3.5. -coumadin per pharm  Permanent atrial fibrillation, since 1994:  CHA2DS2-VASc Score is 5, needs oral anticoagulation. Patient is on Coumadin. Heart rate is well controlled. -continue coumadin and metoprolol  Sleep apnea: - on C-pap   Dyslipidemia, goal LDL below 70: -[place] and zetia  HTN:  -Continue home medications: Metoprolol -Patient is on IV Lasix -IV hydralazine prn  Type II diabetes mellitus with renal manifestations ([place]): Last A1c 9.3 on 04/10/19, poorly controled. Patient is taking Lantus, Jardiance, Victoza, Amaryl at home -will decrease Lantus dose from 45 to 30 unit twice daily -SSI  Chest pain and Elevated troponin: Patient states that he had one episode of mild chest pain in the morning, which has completely resolved.  Patient has chronically elevated troponin, baseline 0.12-0.14 recently.  Today his troponin is 0.13--> 0.12 which is at his baseline. -f/u trop x 3 -check A1c and FLP  CKD (chronic kidney disease), stage III ([place]): Baseline creatinine 1.7-2.0. his creatinine is 2.03, and BUN 43, slightly worse on admission. -Follow-up renal function. BMP.  Left foot wound: No fever or leukocytosis. -Let pt complete current course of Bactrim (from 6/3-6/12)    DVT ppx: on coumadin Code Status: Full code Family Communication: None at bed side.     Disposition Plan:  Anticipate discharge back to previous home environment Consults called:  none Admission status: Obs / tele   Date of Service 05/13/2019    [facility]   If 7PM-7AM, please contact night-coverage [email or web address] Password [email or web address] 05/13/2019, 2:10 AM

## 2019-05-12 NOTE — Telephone Encounter (Signed)
Ordered, and notified [facility] to get patient set up.

## 2019-05-13 DIAGNOSIS — G4730 Sleep apnea, unspecified: Secondary | ICD-10-CM | POA: Diagnosis not present

## 2019-05-13 DIAGNOSIS — Z888 Allergy status to other drugs, medicaments and biological substances status: Secondary | ICD-10-CM | POA: Diagnosis not present

## 2019-05-13 DIAGNOSIS — I4510 Unspecified right bundle-branch block: Secondary | ICD-10-CM | POA: Diagnosis present

## 2019-05-13 DIAGNOSIS — E782 Mixed hyperlipidemia: Secondary | ICD-10-CM | POA: Diagnosis present

## 2019-05-13 DIAGNOSIS — Z91041 Radiographic dye allergy status: Secondary | ICD-10-CM | POA: Diagnosis not present

## 2019-05-13 DIAGNOSIS — G4733 Obstructive sleep apnea (adult) (pediatric): Secondary | ICD-10-CM | POA: Diagnosis present

## 2019-05-13 DIAGNOSIS — Z87891 Personal history of nicotine dependence: Secondary | ICD-10-CM | POA: Diagnosis not present

## 2019-05-13 DIAGNOSIS — I4821 Permanent atrial fibrillation: Secondary | ICD-10-CM | POA: Diagnosis present

## 2019-05-13 DIAGNOSIS — Z8349 Family history of other endocrine, nutritional and metabolic diseases: Secondary | ICD-10-CM | POA: Diagnosis not present

## 2019-05-13 DIAGNOSIS — I5023 Acute on chronic systolic (congestive) heart failure: Secondary | ICD-10-CM | POA: Diagnosis not present

## 2019-05-13 DIAGNOSIS — I509 Heart failure, unspecified: Secondary | ICD-10-CM | POA: Diagnosis not present

## 2019-05-13 DIAGNOSIS — Z20828 Contact with and (suspected) exposure to other viral communicable diseases: Secondary | ICD-10-CM | POA: Diagnosis present

## 2019-05-13 DIAGNOSIS — S90922A Unspecified superficial injury of left foot, initial encounter: Secondary | ICD-10-CM | POA: Diagnosis present

## 2019-05-13 DIAGNOSIS — Z7901 Long term (current) use of anticoagulants: Secondary | ICD-10-CM | POA: Diagnosis not present

## 2019-05-13 DIAGNOSIS — I252 Old myocardial infarction: Secondary | ICD-10-CM | POA: Diagnosis not present

## 2019-05-13 DIAGNOSIS — Z880 Allergy status to penicillin: Secondary | ICD-10-CM | POA: Diagnosis not present

## 2019-05-13 DIAGNOSIS — I2510 Atherosclerotic heart disease of native coronary artery without angina pectoris: Secondary | ICD-10-CM | POA: Diagnosis present

## 2019-05-13 DIAGNOSIS — I248 Other forms of acute ischemic heart disease: Secondary | ICD-10-CM | POA: Diagnosis present

## 2019-05-13 DIAGNOSIS — F419 Anxiety disorder, unspecified: Secondary | ICD-10-CM | POA: Diagnosis present

## 2019-05-13 DIAGNOSIS — Z8249 Family history of ischemic heart disease and other diseases of the circulatory system: Secondary | ICD-10-CM | POA: Diagnosis not present

## 2019-05-13 DIAGNOSIS — N183 Chronic kidney disease, stage 3 (moderate): Secondary | ICD-10-CM | POA: Diagnosis present

## 2019-05-13 DIAGNOSIS — Z952 Presence of prosthetic heart valve: Secondary | ICD-10-CM

## 2019-05-13 DIAGNOSIS — I110 Hypertensive heart disease with heart failure: Secondary | ICD-10-CM | POA: Diagnosis not present

## 2019-05-13 DIAGNOSIS — E785 Hyperlipidemia, unspecified: Secondary | ICD-10-CM | POA: Diagnosis not present

## 2019-05-13 DIAGNOSIS — E1122 Type 2 diabetes mellitus with diabetic chronic kidney disease: Secondary | ICD-10-CM | POA: Diagnosis present

## 2019-05-13 DIAGNOSIS — X58XXXA Exposure to other specified factors, initial encounter: Secondary | ICD-10-CM | POA: Diagnosis present

## 2019-05-13 DIAGNOSIS — I5043 Acute on chronic combined systolic (congestive) and diastolic (congestive) heart failure: Secondary | ICD-10-CM | POA: Diagnosis present

## 2019-05-13 DIAGNOSIS — F418 Other specified anxiety disorders: Secondary | ICD-10-CM | POA: Diagnosis present

## 2019-05-13 DIAGNOSIS — I130 Hypertensive heart and chronic kidney disease with heart failure and stage 1 through stage 4 chronic kidney disease, or unspecified chronic kidney disease: Secondary | ICD-10-CM | POA: Diagnosis present

## 2019-05-13 DIAGNOSIS — Z9049 Acquired absence of other specified parts of digestive tract: Secondary | ICD-10-CM | POA: Diagnosis not present

## 2019-05-13 LAB — PROTIME-INR
INR: 2.9 — ABNORMAL HIGH (ref 0.8–1.2)
Prothrombin Time: 29.8 seconds — ABNORMAL HIGH (ref 11.4–15.2)

## 2019-05-13 LAB — LIPID PANEL
Cholesterol: 111 mg/dL (ref 0–200)
HDL: 19 mg/dL — ABNORMAL LOW (ref >40)
LDL Cholesterol: 74 mg/dL (ref 0–99)
Total CHOL/HDL Ratio: 5.8 RATIO
Triglycerides: 89 mg/dL (ref <150)
VLDL: 18 mg/dL (ref 0–40)

## 2019-05-13 LAB — BASIC METABOLIC PANEL
Anion gap: 9 (ref 5–15)
BUN: 40 mg/dL — ABNORMAL HIGH (ref 8–23)
CO2: 21 mmol/L — ABNORMAL LOW (ref 22–32)
Calcium: 9 mg/dL (ref 8.9–10.3)
Chloride: 107 mmol/L (ref 98–111)
Creatinine, Ser: 2.01 mg/dL — ABNORMAL HIGH (ref 0.61–1.24)
GFR calc Af Amer: 38 mL/min — ABNORMAL LOW (ref >60)
GFR calc non Af Amer: 33 mL/min — ABNORMAL LOW (ref >60)
Glucose, Bld: 169 mg/dL — ABNORMAL HIGH (ref 70–99)
Potassium: 4.1 mmol/L (ref 3.5–5.1)
Sodium: 137 mmol/L (ref 135–145)

## 2019-05-13 LAB — HEMOGLOBIN A1C
Hgb A1c MFr Bld: 8.7 % — ABNORMAL HIGH (ref 4.8–5.6)
Mean Plasma Glucose: 202.99 mg/dL

## 2019-05-13 LAB — TROPONIN I
Troponin I: 0.13 ng/mL (ref <0.03)
Troponin I: 0.14 ng/mL (ref <0.03)
Troponin I: 0.11 ng/mL (ref <0.03)

## 2019-05-13 LAB — GLUCOSE, CAPILLARY
Glucose-Capillary: 105 mg/dL — ABNORMAL HIGH (ref 70–99)
Glucose-Capillary: 233 mg/dL — ABNORMAL HIGH (ref 70–99)
Glucose-Capillary: 206 mg/dL — ABNORMAL HIGH (ref 70–99)
Glucose-Capillary: 261 mg/dL — ABNORMAL HIGH (ref 70–99)

## 2019-05-13 MED ORDER — WARFARIN SODIUM 5 MG PO TABS
8.0000 mg | ORAL_TABLET | Freq: Once | ORAL | Status: AC
  Administered 2019-05-13: 8 mg via ORAL
  Filled 2019-05-13: qty 1

## 2019-05-13 MED ORDER — BISACODYL 5 MG PO TBEC
10.0000 mg | DELAYED_RELEASE_TABLET | Freq: Once | ORAL | Status: AC
  Administered 2019-05-13: 10 mg via ORAL
  Filled 2019-05-13: qty 2

## 2019-05-13 NOTE — Progress Notes (Signed)
ANTICOAGULATION CONSULT NOTE - [place] for warfarin Indication: atrial fibrillation and mechanical AVR  Allergies  Allergen Reactions  . Iohexol Anaphylaxis  . Niacin And Related     Flushing with immediate realese  . Penicillins Other (See Comments)    Unknown.[name]  Did it involve swelling of the face/tongue/throat, SOB, or low BP? Unknown Did it involve sudden or severe rash/hives, skin peeling, or any reaction on the inside of your mouth or nose? Unknown Did you need to seek medical attention at a hospital or doctor's office? Unknown When did it last happen?Childhood If all above answers are "NO", may proceed with cephalosporin use.    Patient Measurements: Height: 5\' 11"  (180.3 cm) Weight: 252 lb 14.4 oz (114.7 kg)(scale a) IBW/kg (Calculated) : 75.3  Vital Signs: Temp: 98 F (36.7 C) (06/10 0407) Temp Source: Oral (06/09 2306) BP: 138/90 (06/10 0407) Pulse Rate: 81 (06/10 0407)  Labs: Recent Labs    05/12/19 1426  05/12/19 1538 05/12/19 1846 05/12/19 2318 05/13/19 0422  HGB 13.4  --   --   --   --   --   HCT 41.7  --   --   --   --   --   PLT 230  --   --   --   --   --   LABPROT  --   --  34.9*  --   --  29.8*  INR  --   --  3.5*  --   --  2.9*  CREATININE 2.03*  --   --   --   --  2.01*  TROPONINI  --    < >  --  0.12* 0.13* 0.14*   < > = values in this interval not displayed.    Estimated Creatinine Clearance: 44.1 mL/min (A) (by C-G formula based on SCr of 2.01 mg/dL (H)).   Medical History: Past Medical History:  Diagnosis Date  . Anxiety   . Aortic stenosis   . Arthritis   . Asthma   . Asymptomatic LV dysfunction, EF 45%, normal coronary arteries on cardiac cath 09/18/12 09/30/2012  . CHF (congestive heart failure) ([place])   . Chronic anticoagulation, on Xarelto prior to admit 09/30/2012  . DM (diabetes mellitus) ([place]) 09/30/2012  . Hepatitis 2003  . Hypertension   . Myocardial infarction  ([place])   . Permanent atrial fibrillation, since 1994 09/30/2012   stress test 02/08/12- normal study, no significant ischemia  . S/P AVR (aortic valve replacement), 09/30/2012   a. s/p mechcanical AVR in 09/2012 (on Coumadin)  . Sleep apnea    uses CPAP    Assessment: 65 yoM admitted with SOB. Pt on warfarin PTA for hx mAVR and PAF. INR therapeutic at 3.5 on admit, remains at goal at 2.9 today. Noted that pt was started on Bactrim DS prior to admission for foot wound which can enhance effect of warfarin.  *Home warfarin dose = 12.5mg  Tues/Thurs/Sat, 10mg  Mon/Wed/Fri/Sat  Goal of Therapy:  INR goal 2.5-3.5 Monitor platelets by anticoagulation protocol: Yes   Plan:  -Warfarin 8mg  PO x1 tonight - will give 20% dose reduction from home dose given Bactrim use -Daily protime   Arrie Senate, PharmD, BCPS Clinical Pharmacist [phone] Please check AMION for all [facility] numbers 05/13/2019

## 2019-05-13 NOTE — Progress Notes (Signed)
[facility] PROGRESS NOTE  Joseph Hoffman IDP:[id number] DOB: [birth year] DOA: 05/12/2019 PCP: Orpah Melter, MD  Assessment/Plan:  Acute on chronic systolic heart failure: 2D echo on 04/10/2019 showed EF of 45-50%. BNP 297, vascular congestion on chest x-ray, bilateral leg edema, consistent with CHF exacerbation.  VQ scan negative for PE.  Venous Dopplers negative for DVT. No events on tele. Reports compliance with meds and denies dietary indiscretions. IV lasix started. Wt stable so far. Has voided 900+ml urine.  -continue IV lasix -Daily weights and strict I/O's -Low salt diet -monitor bmet as creatinine trending up slightly -awaits cardiology input  S/P AVR, 09/29/12, [employer]. 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 On coumadin. INR 3.5. -coumadin per pharm  Permanent atrial fibrillation, since 1994:  CHA2DS2-VASc Score is 5. Patient is on Coumadin. Heart rate is well controlled. -continue coumadin and metoprolol  Sleep apnea: - on C-pap   Dyslipidemia, goal LDL below 70: -[place] and zetia  HTN: fair control. Home medications include Metoprolol and lasix -Patient is on IV Lasix -continue home metoprolol -IV hydralazine prn  Type II diabetes mellitus with renal manifestations ([place]): Last A1c 9.3 on 04/10/19, poorly controled. Patient is taking Lantus, Jardiance, Victoza, Amaryl at home -will decrease Lantus dose from 45 to 30 unit twice daily -SSI  Chest pain and Elevated troponin: Patient stated that he had one episode of mild chest pain that completely resolved quickly.  Patient has chronically elevated troponin, baseline 0.12-0.14 recently.  Today his troponin is 0.13--> 0.12 which is at his baseline. HgA1c 8.7 today and HDL 19 otherwise lipid panel within limits of normal. ekg without acute changes  CKD (chronic kidney disease), stage III ([place]): Baseline creatinine 1.7-2.0. his creatinine is 2.03, and BUN 43, slightly worse on admission. Stable this am at 2.01.   -monitor urine output -Follow-up renal function. BMP.  Left foot wound: No fever or leukocytosis. -Let pt complete current course of Bactrim (from 6/3-6/12)    Code Status: full Family Communication: patient Disposition Plan: home when ready   Consultants:  cardiology  Procedures:    Antibiotics:    HPI/Subjective: Sitting in chair feet elevated. Reports not much improvement in respiratory effort.  Objective: Vitals:   05/13/19 0913 05/13/19 1205  BP: (!) 144/82 122/81  Pulse: (!) 57 76  Resp:  20  Temp:  98.4 F (36.9 C)  SpO2: 94% 95%    Intake/Output Summary (Last 24 hours) at 05/13/2019 1224 Last data filed at 05/13/2019 0915 Gross per 24 hour  Intake 483 ml  Output 1650 ml  Net -1167 ml   Filed Weights   05/12/19 1448 05/12/19 1822 05/13/19 0003  Weight: 114.8 kg 114.8 kg 114.7 kg    Exam:   General:  Up in chair no acute distress  Cardiovascular: rrr +valve click, 2+LE edema bilaterally  Respiratory: normal effort BS quite diminished. No crackles or wheezing  Abdomen: non-distended +BS no guarding or rebounding  Musculoskeletal: joints without swelling/erythema   Data Reviewed: Basic Metabolic Panel: Recent Labs  Lab 05/12/19 1426 05/13/19 0422  NA 137 137  K 4.7 4.1  CL 103 107  CO2 22 21*  GLUCOSE 119* 169*  BUN 43* 40*  CREATININE 2.03* 2.01*  CALCIUM 9.7 9.0   Liver Function Tests: No results for input(s): AST, ALT, ALKPHOS, BILITOT, PROT, ALBUMIN in the last 168 hours. No results for input(s): LIPASE, AMYLASE in the last 168 hours. No results for input(s): AMMONIA in the last 168 hours. CBC: Recent Labs  Lab 05/12/19  1426  WBC 9.6  HGB 13.4  HCT 41.7  MCV 82.9  PLT 230   Cardiac Enzymes: Recent Labs  Lab 05/12/19 1509 05/12/19 1846 05/12/19 2318 05/13/19 0422 05/13/19 1054  TROPONINI 0.13* 0.12* 0.13* 0.14* 0.11*   BNP (last 3 results) Recent Labs    04/09/19 1317 05/12/19 1426  BNP 222.9* 297.0*     ProBNP (last 3 results) No results for input(s): PROBNP in the last 8760 hours.  CBG: Recent Labs  Lab 05/12/19 2321 05/13/19 0619 05/13/19 1208  GLUCAP 103* 105* 233*    No results found for this or any previous visit (from the past 240 hour(s)).   Studies: Dg Chest 2 View  Result Date: 05/12/2019 CLINICAL DATA:  Chest pain, shortness of breath, and leg swelling for few days, history CHF, coronary artery disease post MI, hypertension, diabetes mellitus, atrial fibrillation, prior AVR EXAM: CHEST - 2 VIEW COMPARISON:  04/09/2019 FINDINGS: Enlargement of cardiac silhouette post median sternotomy and AVR. Pulmonary vascular congestion.  No mediastinal contours normal. RIGHT pleural effusion and basilar atelectasis increased from prior study. Minimal interstitial prominence, likely representing mild chronic failure. No acute pulmonary edema or segmental consolidation. No pneumothorax. Scattered endplate spur formation thoracic spine. IMPRESSION: Enlargement of cardiac silhouette with slight pulmonary vascular congestion and minimal chronic failure. Slightly increased RIGHT pleural effusion and basilar atelectasis. Electronically Signed   By: Lavonia Dana M.D.   On: 05/12/2019 15:16   Nm Pulmonary Perfusion  Result Date: 05/12/2019 CLINICAL DATA:  Shortness of breath EXAM: NUCLEAR MEDICINE PERFUSION LUNG SCAN TECHNIQUE: Perfusion images were obtained in multiple projections after intravenous injection of radiopharmaceutical. Ventilation scans intentionally deferred if perfusion scan and chest x-ray adequate for interpretation during COVID 19 epidemic. RADIOPHARMACEUTICALS:  1.75 mCi Tc-27m MAA IV COMPARISON:  None FINDINGS: Enlargement of cardiac silhouette. Perfusion images are otherwise normal. No segmental or subsegmental perfusion defects. Ventilation exam not performed. IMPRESSION: Normal perfusion lung scan. Electronically Signed   By: Lavonia Dana M.D.   On: 05/12/2019 17:45   Vas [place]  Lower Extremity Venous (dvt) (mc And Wl 7a-7p)  Result Date: 05/12/2019  Lower Venous Study Indications: Swelling, Erythema, and SOB.  Performing Technologist: Toma Copier RVS  Examination Guidelines: A complete evaluation includes B-mode imaging, spectral Doppler, color Doppler, and power Doppler as needed of all accessible portions of each vessel. Bilateral testing is considered an integral part of a complete examination. Limited examinations for reoccurring indications may be performed as noted.  +-----+---------------+---------+-----------+----------+-------+ RIGHTCompressibilityPhasicitySpontaneityPropertiesSummary +-----+---------------+---------+-----------+----------+-------+ CFV  Full           Yes      Yes                          +-----+---------------+---------+-----------+----------+-------+ SFJ  Full                                                 +-----+---------------+---------+-----------+----------+-------+   +---------+---------------+---------+-----------+----------+-------------------+ LEFT     CompressibilityPhasicitySpontaneityPropertiesSummary             +---------+---------------+---------+-----------+----------+-------------------+ CFV      Full           Yes      Yes                                      +---------+---------------+---------+-----------+----------+-------------------+  SFJ      Full                                                             +---------+---------------+---------+-----------+----------+-------------------+ FV Prox  Full           Yes      Yes                                      +---------+---------------+---------+-----------+----------+-------------------+ FV Mid   Full                                                             +---------+---------------+---------+-----------+----------+-------------------+ FV DistalFull           Yes      Yes                                       +---------+---------------+---------+-----------+----------+-------------------+ PFV      Full           Yes      Yes                                      +---------+---------------+---------+-----------+----------+-------------------+ POP      Full           Yes      Yes                                      +---------+---------------+---------+-----------+----------+-------------------+ PTV      Full                                                             +---------+---------------+---------+-----------+----------+-------------------+ PERO     Full                                         Difficult to image                                                        due to the  tightness of the                                                          calf                +---------+---------------+---------+-----------+----------+-------------------+   Left Technical Findings: Doppler waveform are pulsitile throughout consistent with a possible fluid overload.   Summary: Right: There is no evdence of a common femoral vein obstruction Left: There is no evidence of deep vein thrombosis in the lower extremity. There is no evdence of a popliteal cystic structure. See technical findings listed above.  *See table(s) above for measurements and observations.    Preliminary     Scheduled Meds: . cholecalciferol  1,000 Units Oral QHS  . digoxin  0.125 mg Oral QHS  . escitalopram  10 mg Oral QPM  . ezetimibe  10 mg Oral QHS  . fenofibrate  160 mg Oral Daily  . furosemide  40 mg Intravenous BID  . Icosapent Ethyl  2 g Oral BID  . insulin aspart  0-9 Units Subcutaneous TID WC  . insulin glargine  30 Units Subcutaneous BID  . metoprolol succinate  37.5 mg Oral BID  . mometasone-formoterol  2 puff Inhalation BID  . multivitamin with minerals   Oral Daily  . omega-3 acid ethyl esters  1 g Oral Daily  .  sodium chloride flush  3 mL Intravenous Q12H  . sulfamethoxazole-trimethoprim  1 tablet Oral BID  . vitamin C  1,000 mg Oral QHS  . warfarin  8 mg Oral ONCE-1800  . Warfarin - Pharmacist Dosing Inpatient   Does not apply q1800   Continuous Infusions: . sodium chloride      Principal Problem:   Acute on chronic systolic (congestive) heart failure (HCC) Active Problems:   Type II diabetes mellitus with renal manifestations (HCC)   Chest pain   Elevated troponin   CKD (chronic kidney disease), stage III (H[facility])   S/P AVR, 09/29/12, [employer]. (discharged 10/05/12)   Permanent atrial fibrillation, since 1994   Sleep apnea, on C-pap   Chronic anticoagulation, on Coumadin after [employer] AVR   Essential hypertension   Dyslipidemia, goal LDL below 70    Time spent: 41 minutes    [place] NP  [facility]  If 7PM-7AM, please contact night-coverage at [email or web address], password [facility] 05/13/2019, 12:24 PM  LOS: 0 days

## 2019-05-13 NOTE — Telephone Encounter (Signed)
Patient went to the ER 

## 2019-05-13 NOTE — Progress Notes (Signed)
Patient arrived to unit [place] bed 3 from emergency room.Assisted patient to bed by nursing staff.Oriented patient to nursing unit and call bell system.Educated patient to call for help prior to getting out of bed.No acute distress noted at present time.Will continue to monitor patient.

## 2019-05-13 NOTE — Progress Notes (Signed)
Pt has on cpap, being ruled out for covid19.

## 2019-05-13 NOTE — Consult Note (Addendum)
Cardiology Consultation:   Patient ID: Joseph Hoffman MRN: [id number]; DOB: 1951/01/16  Admit date: 05/12/2019 Date of Consult: 05/13/2019  Primary Care Provider: Orpah Melter, MD Primary Cardiologist: Shelva Majestic, MD  Primary Electrophysiologist:  None    Patient Profile:   Joseph Hoffman is a 68 y.o. male with a hx of permanent Afib, DM2, HTN, OSA on CPAP, AVR in 2706, chronic systolic HF, and hepatitis who is being seen today for the evaluation of CHF at the request of Dr. Erlinda Hong.  History of Present Illness:   Joseph Hoffman is a 68 yo male noted above. He was first diagnosed with Afib back in 1994. Developed progressive aortic valve stenosis in 10/13 and underwent St Jude AVR with [name]. Had some transient CHF issues post op but resolved with lasix.  Echo in 10/13 following surgery showed an EF of 55-60% with well seated valve.   He retired back in Dec 2016. Seen for preop clearance in July 2018 for a colonoscopy. Was hospitalized back in 9/18 at [facility] in [place] with acute on chronic HF which resolved with IV lasix. Troponin was mildly increased in the setting of acute CHF. Echo then showed EF of 50-55% with mitral valve leaflets were calcified with 2+ MR.   He was seen in the office in 2018 and started on Toprol XL for better rate control. He was also felt to be a good candidate for Jardiance given his recent HF admission and started at 10mg  daily. Follow up echo in May 2019 showed mild LVH with EF of 50-55% without rWMA. Mechnical valve was well seated with mean aortic gradient 89mmHg with peak gradient of 71mmHg with mild AR, moderate to severe biatrial enlargement.   He had a recent hospitalization in May 2020 with CHF and 8lb weight gain. Echo during that admission showed slight decline in EF to 45-50% with severe biatrial enlargement, mild to moderate MR. He was diuresed with IV lasix and discharged. His last visit was a virtual visit on 04/20/19 with Dr. Claiborne Billings. He reported  still feeling short of breat since his admission. His medications were reviewed and continued the same.   He presented to the ED on 6/9 with shortness of breath. States over the past week he had been having progressive shortness of breath and LLE swelling. Weights had been stable, but he attempted to increase his diuretic dose taking an extra lasix when needed. Did not experience much symptom relief with this. One brief episode of chest pain the morning of admission. Has also be taking Bactrim for a left toe wound.   In the ED his labs showed stable electrolytes, BNP 297, Hgb 13.4, Troponin I 0.13>>0.12>>0.14. INR 3.5. EKG showed Afib with RBBB. CXR with mild vascular congestion. VQ scan was negative. LE Doppler was negative for DVT. He was admitted to IM for further work up and started on IV lasix 40mg  BID. Thus far had about 1.5L over the past 24hrs, net -927cc. He continues to feel short of breath even after receiving lasix. No further chest pain.   Past Medical History:  Diagnosis Date   Anxiety    Aortic stenosis    Arthritis    Asthma    Asymptomatic LV dysfunction, EF 45%, normal coronary arteries on cardiac cath 09/18/12 09/30/2012   CHF (congestive heart failure) (HCC)    Chronic anticoagulation, on Xarelto prior to admit 09/30/2012   DM (diabetes mellitus) ([place]) 09/30/2012   Hepatitis 2003   Hypertension    Myocardial  infarction [facility])    Permanent atrial fibrillation, since 1994 09/30/2012   stress test 02/08/12- normal study, no significant ischemia   S/P AVR (aortic valve replacement), 09/30/2012   a. s/p mechcanical AVR in 09/2012 (on Coumadin)   Sleep apnea    uses CPAP    Past Surgical History:  Procedure Laterality Date   ABDOMINAL ANGIOGRAM  09/18/2012   Procedure: ABDOMINAL ANGIOGRAM;  Surgeon: Troy Sine, MD;  Location: [facility] CATH LAB;  Service: Cardiovascular;;   AORTIC VALVE REPLACEMENT  09/29/2012   Procedure: AORTIC VALVE REPLACEMENT (AVR);   Surgeon: Gaye Pollack, MD;  Location: [place];  Service: Open Heart Surgery;  Laterality: N/A;   ARCH AORTOGRAM  09/18/2012   Procedure: ARCH AORTOGRAM;  Surgeon: Troy Sine, MD;  Location: [facility] CATH LAB;  Service: Cardiovascular;;   CARDIAC CATHETERIZATION  09/18/12   severe calcific aortic stenosis, peak gradient 73mm, mean gradient 34mm, EF 45%   CARDIAC VALVE REPLACEMENT     AVR 09-29-12   CHOLECYSTECTOMY     FRACTURE SURGERY     LEFT AND RIGHT HEART CATHETERIZATION WITH CORONARY ANGIOGRAM N/A 09/18/2012   Procedure: LEFT AND RIGHT HEART CATHETERIZATION WITH CORONARY ANGIOGRAM;  Surgeon: Troy Sine, MD;  Location: [facility] CATH LAB;  Service: Cardiovascular;  Laterality: N/A;     Home Medications:  Prior to Admission medications   Medication Sig Start Date End Date Taking? Authorizing Provider  acetaminophen (TYLENOL) 325 MG tablet Take 2 tablets (650 mg total) by mouth every 4 (four) hours as needed for pain or fever. 10/16/12  Yes Kilroy, Luke K, PA-C  albuterol (PROVENTIL HFA;VENTOLIN HFA) 108 (90 BASE) MCG/ACT inhaler Inhale 2 puffs into the lungs every 6 (six) hours as needed. For shortness of breath   Yes [provider]  Ascorbic Acid (VITAMIN C) 1000 MG tablet Take 1,000 mg by mouth at bedtime.    Yes [provider]  cholecalciferol (VITAMIN D) 1000 UNITS tablet Take 1,000 Units by mouth at bedtime.    Yes [provider]  digoxin (LANOXIN) 0.125 MG tablet TAKE 1 TABLET EVERY DAY Patient taking differently: Take 0.125 mg by mouth at bedtime.  01/21/19  Yes Troy Sine, MD  empagliflozin (JARDIANCE) 25 MG TABS tablet Take 25 mg by mouth daily. 04/20/19  Yes Troy Sine, MD  escitalopram (LEXAPRO) 10 MG tablet Take 10 mg by mouth every evening.    Yes [provider]  ezetimibe (ZETIA) 10 MG tablet Take 1 tablet (10 mg total) by mouth daily. Patient taking differently: Take 10 mg by mouth at bedtime.  02/09/19 05/12/19 Yes Almyra Deforest, PA    fenofibrate (TRICOR) 145 MG tablet Take 1 tablet (145 mg total) by mouth daily. 02/09/19  Yes Almyra Deforest, PA  Fluticasone-Salmeterol (ADVAIR) 250-50 MCG/DOSE AEPB Inhale 1 puff into the lungs as needed (shortness of breath). For asthma related symptoms   Yes [provider]  furosemide (LASIX) 40 MG tablet Take 1 tablet (40 mg total) by mouth daily. Can take an additional tablet if weight increases by 2 lbs or more. 04/12/19  Yes Daune Perch, NP  glimepiride (AMARYL) 2 MG tablet Take 2 mg by mouth daily with breakfast.  05/25/17  Yes [provider]  Glucosamine-Chondroit-Vit C-Mn (GLUCOSAMINE 1500 COMPLEX PO) Take 1,500 mg by mouth at bedtime.    Yes [provider]  Icosapent Ethyl (VASCEPA) 1 g CAPS Take 2 capsules (2 g total) by mouth 2 (two) times daily. 04/20/19  Yes  Troy Sine, MD  insulin glargine (LANTUS) 100 UNIT/ML injection Inject 45 Units into the skin 2 (two) times daily.    Yes [provider]  Liraglutide (VICTOZA [place]) Inject 1.8 mg into the skin at bedtime.    Yes [provider]  metFORMIN (GLUCOPHAGE-XR) 500 MG 24 hr tablet Take 2,000 mg by mouth at bedtime.   Yes [provider]  metoprolol succinate (TOPROL XL) 25 MG 24 hr tablet Take 1 tablet (25 mg total) by mouth daily. Patient taking differently: Take 37.5 mg by mouth 2 (two) times a day.  12/11/18  Yes Troy Sine, MD  Multiple Vitamins-Minerals (CENTRUM SPECIALIST HEART PO) Take 1 tablet by mouth daily.    Yes [provider]  omega-3 acid ethyl esters (LOVAZA) 1 g capsule Take 1 g by mouth daily.   Yes [provider]  sulfamethoxazole-trimethoprim (BACTRIM DS) 800-160 MG tablet Take 1 tablet by mouth 2 (two) times daily. for 10 days 05/06/19  Yes [provider]  warfarin (COUMADIN) 10 MG tablet Take 10 mg by mouth See admin instructions. Take Sun. Mon. Wed.Ludwig Clarks   Yes [provider]  warfarin (COUMADIN) 5 MG tablet Take as  instructed with 10 mg. Patient taking differently: Take 2.5 mg by mouth See admin instructions. Take with 10 mg to get a total of 12.5 for Tues, Thurs and Saturday 12/11/18  Yes Troy Sine, MD  B-D ULTRAFINE III SHORT PEN 31G X 8 MM MISC  10/04/13   [provider]  EASY TOUCH LANCETS 30G/TWIST [place]  11/05/13   [provider]    Inpatient Medications: Scheduled Meds:  cholecalciferol  1,000 Units Oral QHS   digoxin  0.125 mg Oral QHS   escitalopram  10 mg Oral QPM   ezetimibe  10 mg Oral QHS   fenofibrate  160 mg Oral Daily   furosemide  40 mg Intravenous BID   Icosapent Ethyl  2 g Oral BID   insulin aspart  0-9 Units Subcutaneous TID WC   insulin glargine  30 Units Subcutaneous BID   metoprolol succinate  37.5 mg Oral BID   mometasone-formoterol  2 puff Inhalation BID   multivitamin with minerals   Oral Daily   omega-3 acid ethyl esters  1 g Oral Daily   sodium chloride flush  3 mL Intravenous Q12H   sulfamethoxazole-trimethoprim  1 tablet Oral BID   vitamin C  1,000 mg Oral QHS   warfarin  8 mg Oral ONCE-1800   Warfarin - Pharmacist Dosing Inpatient   Does not apply q1800   Continuous Infusions:  sodium chloride     PRN Meds: sodium chloride, acetaminophen, albuterol, hydrALAZINE, morphine injection, nitroGLYCERIN, ondansetron (ZOFRAN) IV, sodium chloride flush  Allergies:    Allergies  Allergen Reactions   Iohexol Anaphylaxis   Niacin And Related     Flushing with immediate realese   Penicillins Other (See Comments)    Unknown.[name]  Did it involve swelling of the face/tongue/throat, SOB, or low BP? Unknown Did it involve sudden or severe rash/hives, skin peeling, or any reaction on the inside of your mouth or nose? Unknown Did you need to seek medical attention at a hospital or doctor's office? Unknown When did it last happen?Childhood If all above answers are NO, may proceed with cephalosporin use.     Social History:   Social History   Socioeconomic History   Marital status: Married    Spouse name: [name]  Number of children: 3   Years of education: 15+   Highest education level: Not on file  Occupational History    Employer: [employer]  Social Needs   Financial resource strain: Not on file   Food insecurity:    Worry: Not on file    Inability: Not on file   Transportation needs:    Medical: Not on file    Non-medical: Not on file  Tobacco Use   Smoking status: Former Smoker    Packs/day: 0.25    Years: 10.00    Pack years: 2.50    Types: Cigarettes    Last attempt to quit: 09/25/1982    Years since quitting: 36.6   Smokeless tobacco: Former Systems developer  Substance and Sexual Activity   Alcohol use: Yes    Comment: occ. beer/wine   Drug use: No   Sexual activity: Yes    Birth control/protection: None  Lifestyle   Physical activity:    Days per week: Not on file    Minutes per session: Not on file   Stress: Not on file  Relationships   Social connections:    Talks on phone: Not on file    Gets together: Not on file    Attends religious service: Not on file    Active member of club or organization: Not on file    Attends meetings of clubs or organizations: Not on file    Relationship status: Not on file   Intimate partner violence:    Fear of current or ex partner: Not on file    Emotionally abused: Not on file    Physically abused: Not on file    Forced sexual activity: Not on file  Other Topics Concern   Not on file  Social History Narrative   Patient is married [name]) and lives with his wife and son.   Patient has three children.   Patient is working full-time.   Patient has a college education.   Patient is right handed.   Patient drinks about 2-3 cups of coffee daily.    Family History:    Family History  Problem Relation Age of Onset   Hypertension Mother    Diabetes Father    Heart attack Brother 45   Hyperlipidemia  Brother 52       stents placed     ROS:  Please see the history of present illness.   All other ROS reviewed and negative.     Physical Exam/Data:   Vitals:   05/13/19 0737 05/13/19 0835 05/13/19 0913 05/13/19 1205  BP: (!) 144/58  (!) 144/82 122/81  Pulse: 87  (!) 57 76  Resp: 18   20  Temp: (!) 97.5 F (36.4 C)   98.4 F (36.9 C)  TempSrc: Oral   Oral  SpO2: 93% 95% 94% 95%  Weight:      Height:        Intake/Output Summary (Last 24 hours) at 05/13/2019 1430 Last data filed at 05/13/2019 1327 Gross per 24 hour  Intake 723 ml  Output 1650 ml  Net -927 ml   Last 3 Weights 05/13/2019 05/12/2019 05/12/2019  Weight (lbs) 252 lb 14.4 oz 253 lb 253 lb  Weight (kg) 114.715 kg 114.76 kg 114.76 kg     Body mass index is 35.27 kg/m.  General:  Well nourished, well developed, in no acute distress HEENT: normal Neck: no JVD Vascular: No carotid bruits; FA pulses 2+ bilaterally without bruits  Cardiac:  normal S1,  S2; + mechanical valve click Lungs:  clear to auscultation bilaterally, no wheezing, rhonchi or rales  Abd: soft, nontender, no hepatomegaly  Ext: no edema Musculoskeletal:  No deformities, BUE and BLE strength normal and equal Skin: warm and dry  Neuro:  CNs 2-12 intact, no focal abnormalities noted Psych:  Normal affect   EKG:  The EKG was personally reviewed and demonstrates:  Afib rate controlled.  Relevant CV Studies:  TTE: 04/10/19  IMPRESSIONS    1. The left ventricle has mildly reduced systolic function, with an ejection fraction of 45-50%. The cavity size was normal. There is moderately increased left ventricular wall thickness. Left ventricular diastolic function could not be evaluated  secondary to atrial fibrillation. Elevated left atrial and left ventricular end-diastolic pressures.  2. The right ventricle has mildly reduced systolic function. The cavity was moderately enlarged. There is no increase in right ventricular wall thickness.  3. Left  atrial size was severely dilated.  4. Right atrial size was severely dilated.  5. The mitral valve is abnormal. Mild thickening of the mitral valve leaflet. Mild calcification of the mitral valve leaflet. There is mild mitral annular calcification present. Mitral valve regurgitation is mild to moderate by color flow Doppler.  Mild-moderate mitral valve stenosis.  6. The tricuspid valve is grossly normal.  7. Moderate calcification of the aortic valve. Aortic valve regurgitation is mild by color flow Doppler. Moderate stenosis of the aortic valve.  8. The inferior vena cava was dilated in size with <50% respiratory variability.  Laboratory Data:  Chemistry Recent Labs  Lab 05/12/19 1426 05/13/19 0422  NA 137 137  K 4.7 4.1  CL 103 107  CO2 22 21*  GLUCOSE 119* 169*  BUN 43* 40*  CREATININE 2.03* 2.01*  CALCIUM 9.7 9.0  GFRNONAA 32* 33*  GFRAA 37* 38*  ANIONGAP 12 9    No results for input(s): PROT, ALBUMIN, AST, ALT, ALKPHOS, BILITOT in the last 168 hours. Hematology Recent Labs  Lab 05/12/19 1426  WBC 9.6  RBC 5.03  HGB 13.4  HCT 41.7  MCV 82.9  MCH 26.6  MCHC 32.1  RDW 17.0*  PLT 230   Cardiac Enzymes Recent Labs  Lab 05/12/19 1509 05/12/19 1846 05/12/19 2318 05/13/19 0422 05/13/19 1054  TROPONINI 0.13* 0.12* 0.13* 0.14* 0.11*   No results for input(s): TROPIPOC in the last 168 hours.  BNP Recent Labs  Lab 05/12/19 1426  BNP 297.0*    DDimer No results for input(s): DDIMER in the last 168 hours.  Lipid Panel     Component Value Date/Time   CHOL 111 05/13/2019 0422   CHOL 156 02/05/2019 1006   CHOL 166 08/23/2014 0853   TRIG 89 05/13/2019 0422   TRIG 274 (H) 08/23/2014 0853   HDL 19 (L) 05/13/2019 0422   HDL 23 (L) 02/05/2019 1006   HDL 26 (L) 08/23/2014 0853   CHOLHDL 5.8 05/13/2019 0422   VLDL 18 05/13/2019 0422   LDLCALC 74 05/13/2019 0422   LDLCALC 84 02/05/2019 1006   LDLCALC 85 08/23/2014 0853   Radiology/Studies:  Dg Chest 2  View  Result Date: 05/12/2019 CLINICAL DATA:  Chest pain, shortness of breath, and leg swelling for few days, history CHF, coronary artery disease post MI, hypertension, diabetes mellitus, atrial fibrillation, prior AVR EXAM: CHEST - 2 VIEW COMPARISON:  04/09/2019 FINDINGS: Enlargement of cardiac silhouette post median sternotomy and AVR. Pulmonary vascular congestion.  No mediastinal contours normal. RIGHT pleural effusion and basilar atelectasis increased from prior  study. Minimal interstitial prominence, likely representing mild chronic failure. No acute pulmonary edema or segmental consolidation. No pneumothorax. Scattered endplate spur formation thoracic spine. IMPRESSION: Enlargement of cardiac silhouette with slight pulmonary vascular congestion and minimal chronic failure. Slightly increased RIGHT pleural effusion and basilar atelectasis. Electronically Signed   By: Lavonia Dana M.D.   On: 05/12/2019 15:16   Nm Pulmonary Perfusion  Result Date: 05/12/2019 CLINICAL DATA:  Shortness of breath EXAM: NUCLEAR MEDICINE PERFUSION LUNG SCAN TECHNIQUE: Perfusion images were obtained in multiple projections after intravenous injection of radiopharmaceutical. Ventilation scans intentionally deferred if perfusion scan and chest x-ray adequate for interpretation during COVID 19 epidemic. RADIOPHARMACEUTICALS:  1.75 mCi Tc-60m MAA IV COMPARISON:  None FINDINGS: Enlargement of cardiac silhouette. Perfusion images are otherwise normal. No segmental or subsegmental perfusion defects. Ventilation exam not performed. IMPRESSION: Normal perfusion lung scan. Electronically Signed   By: Lavonia Dana M.D.   On: 05/12/2019 17:45   Vas [place] Lower Extremity Venous (dvt) (mc And Wl 7a-7p)  Result Date: 05/12/2019  Lower Venous Study Indications: Swelling, Erythema, and SOB.  Performing Technologist: Toma Copier RVS  Examination Guidelines: A complete evaluation includes B-mode imaging, spectral Doppler, color Doppler, and  power Doppler as needed of all accessible portions of each vessel. Bilateral testing is considered an integral part of a complete examination. Limited examinations for reoccurring indications may be performed as noted.  +-----+---------------+---------+-----------+----------+-------+  RIGHT Compressibility Phasicity Spontaneity Properties Summary  +-----+---------------+---------+-----------+----------+-------+  CFV   Full            Yes       Yes                             +-----+---------------+---------+-----------+----------+-------+  SFJ   Full                                                      +-----+---------------+---------+-----------+----------+-------+   +---------+---------------+---------+-----------+----------+-------------------+  LEFT      Compressibility Phasicity Spontaneity Properties Summary              +---------+---------------+---------+-----------+----------+-------------------+  CFV       Full            Yes       Yes                                         +---------+---------------+---------+-----------+----------+-------------------+  SFJ       Full                                                                  +---------+---------------+---------+-----------+----------+-------------------+  FV Prox   Full            Yes       Yes                                         +---------+---------------+---------+-----------+----------+-------------------+  FV Mid    Full                                                                  +---------+---------------+---------+-----------+----------+-------------------+  FV Distal Full            Yes       Yes                                         +---------+---------------+---------+-----------+----------+-------------------+  PFV       Full            Yes       Yes                                         +---------+---------------+---------+-----------+----------+-------------------+  POP       Full            Yes       Yes                                          +---------+---------------+---------+-----------+----------+-------------------+  PTV       Full                                                                  +---------+---------------+---------+-----------+----------+-------------------+  PERO      Full                                             Difficult to image                                                               due to the                                                                       tightness of the  calf                 +---------+---------------+---------+-----------+----------+-------------------+   Left Technical Findings: Doppler waveform are pulsitile throughout consistent with a possible fluid overload.   Summary: Right: There is no evdence of a common femoral vein obstruction Left: There is no evidence of deep vein thrombosis in the lower extremity. There is no evdence of a popliteal cystic structure. See technical findings listed above.  *See table(s) above for measurements and observations.    Preliminary     Assessment and Plan:   Joseph Hoffman is a 68 y.o. male with a hx of permanent Afib, DM2, HTN, OSA on CPAP, AVR in 0174, chronic systolic HF, and hepatitis who is being seen today for the evaluation of CHF at the request of Dr. Erlinda Hong.  1. Dyspnea: reports symptoms have not much improved even with diuresis. He was attempting to increase lasix dose prior to admission without much success as well. Has OSA but reports being complaint with Cpap. Shortness of breath seems to be present all the time according to patient and not worse with exertion. CXR with mild edema and BNP elevated at 297 on admission. Could be multifactorial.   2. Acute on Chronic HF: Reports compliance with home meds and diet. Reports his weights were stable despite symptoms. Currently on IV lasix 40mg  BID with about 1.5L over the past 24 hours. Continue diuresis for  now. Reassess in the am  3. Permanent Afib: rate controlled on Toprol XL. On coumadin  4. AS s/p AVR: INR 3.5 on admission, echo 04/10/19 with well seated valve. PharmD to dose coumadin  5. Elevated troponin: low flat trend. Similar valve during admission back in May and felt to be 2/2 to CHF and in the setting of AKI. Did have some brief chest pain prior to admission. Last cath noted from 2013 prior to AVR. Slight reduction in EF at that time as well. Will review with MD.   6. DM2: followed by endocrinology. On Jardiance, metformin, Victoza as an outpatient. -- Hgb A1c 8.7, had been 9.3 back in May  7. HL: Does not tolerate statins. On Vacepa, Zetia and fenofibrate. LDL 74  For questions or updates, please contact [place] Please consult [email or web address] for contact info under   Signed, Reino Bellis, NP  05/13/2019 2:30 PM    Patient seen and examined. Agree with assessment and plan.  Mr. Brannock is a 1 gentleman who is well-known to me.  He has a history of permanent atrial fibrillation dating back to 36.  In 2013 he developed progressive aortic valve stenosis and underwent Saint Jude aortic valve replacement by Dr. Arvid Right.  Additional problems include obstructive sleep apnea on CPAP initiated in 2012.  He has had issues with shortness of breath, next hyperlipidemia, lower extremity edema, and renal insufficiency.  He had been hospitalized in May 2020 with CHF..  Following diuresis creatinine had increased 2.08 and improved to 1.78 on day of discharge.  During that hospitalization he required oxygen supplementation and oxygen was bled into his nocturnal CPAP machine with significant benefit.  Doppler study at that time did show reduction in LV function with an EF of 45 to 50% without wall motion abnormality.  He had severe biatrial enlargement, mitral annular calcification, and his mean aortic gradient was 15 with a peak gradient of 29.5 mm.  Recently, he has again noted some left  leg swelling.  He also has noticed some shortness of breath.  He  has taken extra diuretics at home.  He had been plan to undergo nocturnal oximetry study that we would be able to obtain oxygen supplementation at bedtime with his CPAP machine but this has not yet been implemented.  He has had issues with poor control of diabetes.  I had initiated Jardiance with him which had helped and this had been titrated up to 25 mg with improvement in prior CHF symptoms. He called the office yesterday and admission was advised due to increased shortness of breath and swelling issues.,  Blood pressure is stable.  He is in atrial fibrillation with rate in the 80s.  It has been relatively stable at 253 pounds.  JVD measured 7 8 cm.  He did not have wheezing.  Rhythm was irregularly irregular with a 2/6 systolic murmur consistent with his [facility] Jude aortic prosthesis.  Abdomen was soft and nontender without a spinal megaly.  There was trivial left ankle edema with no edema in the right ankle.  Neurologically he was grossly nonfocal.  He had normal affect and mood.  Currently on Lasix 40 mg IV twice daily net diuresis is 1.5 L over the last 24 hours.  BNP was elevated mildly more suggestive of diastolic heart failure rather than acute systolic dysfunction.  His atrial fibrillation rate has been relatively controlled in the 70s to 80s.  He has not had any recent ischemic evaluation and with his underlying diabetes mellitus evaluation for potential ischemia should be undertaken.  However, with his renal insufficiency he may not be a candidate for definitive right and left heart cardiac catheterization.  Recommend supplemental oxygen with CPAP therapy while in the hospital and at home.  I will repeat BNP level and chemistry profile tomorrow.  We will tentatively plan to risk stratify with a Lexiscan Myoview study potentially for Friday if this can be scheduled.   Troy Sine, MD, [facility] 05/13/2019 5:42 PM

## 2019-05-13 NOTE — Progress Notes (Signed)
This nurse called and received report from [facility].

## 2019-05-13 NOTE — Plan of Care (Signed)
°  Problem: Education: °Goal: Ability to demonstrate management of disease process will improve °Outcome: Progressing °  °Problem: Education: °Goal: Ability to verbalize understanding of medication therapies will improve °Outcome: Progressing °  °Problem: Activity: °Goal: Capacity to carry out activities will improve °Outcome: Progressing °  °

## 2019-05-13 NOTE — Progress Notes (Signed)
Received phone call from pharmacy hospital does not carry patient medication icosapent .Patient asked to have family member bring medication in morning.

## 2019-05-13 NOTE — Progress Notes (Signed)
Pt unable to wear CPAP at this time d/t rule out covid testing. Pt was swabbed today so pt unable to wear until results are back. RT made RN and charge aware. RT will continue to monitor.

## 2019-05-14 LAB — BASIC METABOLIC PANEL
Anion gap: 12 (ref 5–15)
BUN: 47 mg/dL — ABNORMAL HIGH (ref 8–23)
CO2: 25 mmol/L (ref 22–32)
Calcium: 9.3 mg/dL (ref 8.9–10.3)
Chloride: 99 mmol/L (ref 98–111)
Creatinine, Ser: 2.4 mg/dL — ABNORMAL HIGH (ref 0.61–1.24)
GFR calc Af Amer: 31 mL/min — ABNORMAL LOW (ref >60)
GFR calc non Af Amer: 26 mL/min — ABNORMAL LOW (ref >60)
Glucose, Bld: 166 mg/dL — ABNORMAL HIGH (ref 70–99)
Potassium: 3.9 mmol/L (ref 3.5–5.1)
Sodium: 136 mmol/L (ref 135–145)

## 2019-05-14 LAB — NOVEL CORONAVIRUS, NAA (HOSP ORDER, SEND-OUT TO REF LAB; TAT 18-24 HRS): SARS-CoV-2, NAA: NOT DETECTED

## 2019-05-14 LAB — GLUCOSE, CAPILLARY
Glucose-Capillary: 161 mg/dL — ABNORMAL HIGH (ref 70–99)
Glucose-Capillary: 206 mg/dL — ABNORMAL HIGH (ref 70–99)
Glucose-Capillary: 253 mg/dL — ABNORMAL HIGH (ref 70–99)
Glucose-Capillary: 217 mg/dL — ABNORMAL HIGH (ref 70–99)

## 2019-05-14 LAB — PROTIME-INR
INR: 3 — ABNORMAL HIGH (ref 0.8–1.2)
Prothrombin Time: 30.9 seconds — ABNORMAL HIGH (ref 11.4–15.2)

## 2019-05-14 MED ORDER — INSULIN ASPART 100 UNIT/ML ~~LOC~~ SOLN
3.0000 [IU] | Freq: Three times a day (TID) | SUBCUTANEOUS | Status: DC
  Administered 2019-05-14 – 2019-05-15 (×2): 3 [IU] via SUBCUTANEOUS

## 2019-05-14 MED ORDER — WARFARIN SODIUM 10 MG PO TABS
10.0000 mg | ORAL_TABLET | Freq: Once | ORAL | Status: AC
  Administered 2019-05-14: 10 mg via ORAL
  Filled 2019-05-14: qty 1

## 2019-05-14 NOTE — Progress Notes (Signed)
Inpatient Diabetes Program Recommendations  AACE/ADA: New Consensus Statement on Inpatient Glycemic Control (2015)  Target Ranges:  Prepandial:   less than 140 mg/dL      Peak postprandial:   less than 180 mg/dL (1-2 hours)      Critically ill patients:  140 - 180 mg/dL   Results for Joseph Hoffman, Joseph Hoffman (MRN [id number]) as of 05/14/2019 12:56  Ref. Range 05/13/2019 06:19 05/13/2019 12:08 05/13/2019 16:29 05/13/2019 21:25 05/14/2019 06:33 05/14/2019 11:36  Glucose-Capillary Latest Ref Range: 70 - 99 mg/dL 105 (H) 233 (H) 206 (H) 261 (H) 161 (H) 206 (H)    Review of Glycemic Control  Diabetes history: DM 2 Outpatient Diabetes medications: Jardiance 25 mg Daily, Glimepiride 2 mg Daily, Lantus 45 units bid, Victoza 1.8 mg Daily, Metformin 2000 mg qhs  Current orders for Inpatient glycemic control:  Lantus 30 units BID Novolog 0-9 units tid  A1c improved 8.7% on 6/10  Inpatient Diabetes Program Recommendations:    Glucose trends increase after meals. Could consider Novolog 3 units tid meal coverage if patient is consuming at least 50% of meals.  Thanks,  Tama Headings RN, MSN, BC-ADM Inpatient Diabetes Coordinator Team Pager [phone] (8a-5p)

## 2019-05-14 NOTE — Progress Notes (Signed)
Pt with 2sec pause

## 2019-05-14 NOTE — Progress Notes (Signed)
Pt placed self on CPAP dream station. Pt on auto titrate with mas 20 min 8 and 3 lpm bled into the system. RT will continue to monitor.

## 2019-05-14 NOTE — Progress Notes (Signed)
Patient's HR drops to 30-40s, non sustaining. Will continue to monitor.

## 2019-05-14 NOTE — Progress Notes (Signed)
[facility] PROGRESS NOTE  Joseph Hoffman TDD:[id number] DOB: January 15, 1951 DOA: 05/12/2019 PCP: Orpah Melter, MD  Assessment/Plan:  Acute on chronic systolic heart WCBJSEG:3T echo on 04/10/2019 showed EF of 45-50%. BNP 297, vascular congestion on chest x-ray, bilateral leg edema, consistent with CHF exacerbation. VQ scan negative for PE. Venous Dopplers negative for DVT. No events on tele. Reports compliance with meds and denies dietary indiscretions. Evaluated by cards who recommend lexiscan myoview tomorrow. Has been recieving IV lasix. Wt stable so far. Has voided 2000+ml urine. Creatinine trending up today. -will hold IV lasix -Daily weights and strict I/O's -Low salt diet -monitor bmet as creatinine trending up -appreciate cards input  S/P AVR, 09/29/12, [employer]. Jude. 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 On coumadin. INR 3.5. -coumadin per pharm  Permanent atrial fibrillation, since 1994:CHA2DS2-VASc Scoreis 5. Patient is on Coumadin.Heart rate is well controlled. -continue coumadin andmetoprolol  Sleep apnea: -on C-pap  Dyslipidemia, goal LDL below 70: -[place] and zetia  HTN: controlled. Home medications includeMetoprolol and lasix -continue home metoprolol -IV hydralazine prn  Type II diabetes mellitus with renal manifestations (HCC):Last A1c9.3 on 04/10/19, poorly controled. Patient is takingLantus, Jardiance, Victoza, Amarylat home -will decrease Lantus dose from45 to 30 unit twice daily -SSI -add meal coverage per diabetes coordinator request  Chest painandElevated troponin:Patient stated that he had one episode of mild chest pain that completely resolved quickly. Patient has chronically elevated troponin, baseline 0.12-0.14 recently. ekg without acute changes. Evaluated by cardiology who opine likely related to demand ischemia -Lexiscan myoview tomorrow  CKD (chronic kidney disease), stage III (HCC):Baseline creatinine1.7-2.0.hiscreatinine  is2.03, and BUN 43, slightly worseon admission. trending up 2.4.  -monitor urine output -stop IV lasix -Follow-up renal function. BMP.  Left foot wound:No fever or leukocytosis. -Let pt complete current course of Bactrim (from 6/3-6/12)   Code Status: full Family Communication: patient Disposition Plan: home hopefully tomorrow   Consultants:  [facility] cardiology  Procedures:    Antibiotics:    HPI/Subjective: Patient 68 yo hx afib, DM, HTN, OSA, AVR in 2013, chf, hepatitis admitted for acute on chronic diastolic heart failure. Cards on board recommending [place] tomorrow. Diuresed well with IV lasix. Stopped lasix today due to creatinine trending up.   Objective: Vitals:   05/14/19 0811 05/14/19 1027  BP: 120/61 133/74  Pulse: 82 63  Resp: 16   Temp: 97.9 F (36.6 C)   SpO2: 96% 97%    Intake/Output Summary (Last 24 hours) at 05/14/2019 1408 Last data filed at 05/14/2019 1238 Gross per 24 hour  Intake 2060 ml  Output 1800 ml  Net 260 ml   Filed Weights   05/12/19 1822 05/13/19 0003 05/14/19 0434  Weight: 114.8 kg 114.7 kg 114.8 kg    Exam:   General:  Ambulating in room with steady gait  Cardiovascular: rrr +murmur trace LE edema  Respiratory: normal effort BS clear bilaterally no wheeze  Abdomen: obese soft +BS no guarding or rebounding  Musculoskeletal: joints without swelling/erythema   Data Reviewed: Basic Metabolic Panel: Recent Labs  Lab 05/12/19 1426 05/13/19 0422 05/14/19 0530  NA 137 137 136  K 4.7 4.1 3.9  CL 103 107 99  CO2 22 21* 25  GLUCOSE 119* 169* 166*  BUN 43* 40* 47*  CREATININE 2.03* 2.01* 2.40*  CALCIUM 9.7 9.0 9.3   Liver Function Tests: No results for input(s): AST, ALT, ALKPHOS, BILITOT, PROT, ALBUMIN in the last 168 hours. No results for input(s): LIPASE, AMYLASE in the last 168 hours. No results for input(s): AMMONIA  in the last 168 hours. CBC: Recent Labs  Lab 05/12/19 1426  WBC 9.6  HGB 13.4  HCT  41.7  MCV 82.9  PLT 230   Cardiac Enzymes: Recent Labs  Lab 05/12/19 1509 05/12/19 1846 05/12/19 2318 05/13/19 0422 05/13/19 1054  TROPONINI 0.13* 0.12* 0.13* 0.14* 0.11*   BNP (last 3 results) Recent Labs    04/09/19 1317 05/12/19 1426  BNP 222.9* 297.0*    ProBNP (last 3 results) No results for input(s): PROBNP in the last 8760 hours.  CBG: Recent Labs  Lab 05/13/19 1208 05/13/19 1629 05/13/19 2125 05/14/19 0633 05/14/19 1136  GLUCAP 233* 206* 261* 161* 206*    Recent Results (from the past 240 hour(s))  Novel Coronavirus, NAA (hospital order; send-out to ref lab)     Status: None   Collection Time: 05/12/19  8:06 PM   Specimen: Nasopharyngeal Swab; Respiratory  Result Value Ref Range Status   SARS-CoV-2, NAA NOT DETECTED NOT DETECTED Final    Comment: (NOTE) This test was developed and its performance characteristics determined by [employer]. This test has not been FDA cleared or approved. This test has been authorized by FDA under an Emergency Use Authorization (EUA). This test is only authorized for the duration of time the declaration that circumstances exist justifying the authorization of the emergency use of in vitro diagnostic tests for detection of SARS-CoV-2 virus and/or diagnosis of COVID-19 infection under section 564(b)(1) of the Act, 21 U.S.C. 829HBZ-1(I)(9), unless the authorization is terminated or revoked sooner. When diagnostic testing is negative, the possibility of a false negative result should be considered in the context of a patient's recent exposures and the presence of clinical signs and symptoms consistent with COVID-19. An individual without symptoms of COVID-19 and who is not shedding SARS-CoV-2 virus would expect to have a negative (not detected) result in this assay. Performed  At: [facility] [address] [place], [place] [id number] Rush Farmer MD BP:[id number]    [place]   Final    Comment: Performed at [facility], [place] [address] [place], [place] [address]     Studies: Dg Chest 2 View  Result Date: 05/12/2019 CLINICAL DATA:  Chest pain, shortness of breath, and leg swelling for few days, history CHF, coronary artery disease post MI, hypertension, diabetes mellitus, atrial fibrillation, prior AVR EXAM: CHEST - 2 VIEW COMPARISON:  04/09/2019 FINDINGS: Enlargement of cardiac silhouette post median sternotomy and AVR. Pulmonary vascular congestion.  No mediastinal contours normal. RIGHT pleural effusion and basilar atelectasis increased from prior study. Minimal interstitial prominence, likely representing mild chronic failure. No acute pulmonary edema or segmental consolidation. No pneumothorax. Scattered endplate spur formation thoracic spine. IMPRESSION: Enlargement of cardiac silhouette with slight pulmonary vascular congestion and minimal chronic failure. Slightly increased RIGHT pleural effusion and basilar atelectasis. Electronically Signed   By: Lavonia Dana M.D.   On: 05/12/2019 15:16   Nm Pulmonary Perfusion  Result Date: 05/12/2019 CLINICAL DATA:  Shortness of breath EXAM: NUCLEAR MEDICINE PERFUSION LUNG SCAN TECHNIQUE: Perfusion images were obtained in multiple projections after intravenous injection of radiopharmaceutical. Ventilation scans intentionally deferred if perfusion scan and chest x-ray adequate for interpretation during COVID 19 epidemic. RADIOPHARMACEUTICALS:  1.75 mCi Tc-82m MAA IV COMPARISON:  None FINDINGS: Enlargement of cardiac silhouette. Perfusion images are otherwise normal. No segmental or subsegmental perfusion defects. Ventilation exam not performed. IMPRESSION: Normal perfusion lung scan. Electronically Signed   By: Lavonia Dana M.D.   On: 05/12/2019 17:45   Vas  [place] Lower Extremity Venous (dvt) (mc And Wl 7a-7p)  Result Date: 05/13/2019  Lower Venous Study Indications: Swelling, Erythema, and SOB.  Performing Technologist: Toma Copier RVS  Examination Guidelines: A complete evaluation includes B-mode imaging, spectral Doppler, color Doppler, and power Doppler as needed of all accessible portions of each vessel. Bilateral testing is considered an integral part of a complete examination. Limited examinations for reoccurring indications may be performed as noted.  +-----+---------------+---------+-----------+----------+-------+ RIGHTCompressibilityPhasicitySpontaneityPropertiesSummary +-----+---------------+---------+-----------+----------+-------+ CFV  Full           Yes      Yes                          +-----+---------------+---------+-----------+----------+-------+ SFJ  Full                                                 +-----+---------------+---------+-----------+----------+-------+   +---------+---------------+---------+-----------+----------+-------------------+ LEFT     CompressibilityPhasicitySpontaneityPropertiesSummary             +---------+---------------+---------+-----------+----------+-------------------+ CFV      Full           Yes      Yes                                      +---------+---------------+---------+-----------+----------+-------------------+ SFJ      Full                                                             +---------+---------------+---------+-----------+----------+-------------------+ FV Prox  Full           Yes      Yes                                      +---------+---------------+---------+-----------+----------+-------------------+ FV Mid   Full                                                             +---------+---------------+---------+-----------+----------+-------------------+ FV DistalFull           Yes      Yes                                      +---------+---------------+---------+-----------+----------+-------------------+ PFV      Full           Yes      Yes                                       +---------+---------------+---------+-----------+----------+-------------------+ POP      Full           Yes      Yes                                      +---------+---------------+---------+-----------+----------+-------------------+  PTV      Full                                                             +---------+---------------+---------+-----------+----------+-------------------+ PERO     Full                                         Difficult to image                                                        due to the                                                                tightness of the                                                          calf                +---------+---------------+---------+-----------+----------+-------------------+   Left Technical Findings: Doppler waveform are pulsitile throughout consistent with a possible fluid overload.   Summary: Right: There is no evdence of a common femoral vein obstruction Left: There is no evidence of deep vein thrombosis in the lower extremity. There is no evdence of a popliteal cystic structure. See technical findings listed above.  *See table(s) above for measurements and observations. Electronically signed by Curt Jews MD on 05/13/2019 at 5:02:47 PM.    Final     Scheduled Meds: . cholecalciferol  1,000 Units Oral QHS  . digoxin  0.125 mg Oral QHS  . escitalopram  10 mg Oral QPM  . ezetimibe  10 mg Oral QHS  . fenofibrate  160 mg Oral Daily  . furosemide  40 mg Intravenous BID  . Icosapent Ethyl  2 g Oral BID  . insulin aspart  0-9 Units Subcutaneous TID WC  . insulin aspart  3 Units Subcutaneous TID WC  . insulin glargine  30 Units Subcutaneous BID  . metoprolol succinate  37.5 mg Oral BID  . mometasone-formoterol  2 puff Inhalation BID  . multivitamin with minerals   Oral Daily  . omega-3 acid ethyl esters  1 g Oral Daily  . sodium chloride flush  3 mL Intravenous Q12H  .  sulfamethoxazole-trimethoprim  1 tablet Oral BID  . vitamin C  1,000 mg Oral QHS  . warfarin  10 mg Oral ONCE-1800  . Warfarin - Pharmacist Dosing Inpatient   Does not apply q1800   Continuous Infusions: . sodium chloride      Principal Problem:   Acute on chronic systolic (congestive) heart failure (HCC) Active Problems:  Type II diabetes mellitus with renal manifestations (HCC)   Chest pain   Elevated troponin   CKD (chronic kidney disease), stage III ([place])   S/P AVR, 09/29/12, [employer]. Jude. (discharged 10/05/12)   Permanent atrial fibrillation, since 1994   Sleep apnea, on C-pap   Chronic anticoagulation, on Coumadin after St Jude AVR   Essential hypertension   Dyslipidemia, goal LDL below 70   Congestive heart failure (CHF) (HCC)   H/O heart valve replacement with mechanical valve    Time spent: 33 minutes    [place] NP  [facility]  If 7PM-7AM, please contact night-coverage at [email or web address], password [facility] 05/14/2019, 2:08 PM  LOS: 1 day

## 2019-05-14 NOTE — Progress Notes (Signed)
Patient's BUN and Creatinine level went up.   Called MD-  MD gave verbal order to hold lasix for now.  AM dose not given

## 2019-05-14 NOTE — Progress Notes (Signed)
ANTICOAGULATION CONSULT NOTE - [place] for warfarin Indication: atrial fibrillation and mechanical AVR  Allergies  Allergen Reactions  . Iohexol Anaphylaxis  . Niacin And Related     Flushing with immediate realese  . Penicillins Other (See Comments)    Unknown.[name]  Did it involve swelling of the face/tongue/throat, SOB, or low BP? Unknown Did it involve sudden or severe rash/hives, skin peeling, or any reaction on the inside of your mouth or nose? Unknown Did you need to seek medical attention at a hospital or doctor's office? Unknown When did it last happen?Childhood If all above answers are "NO", may proceed with cephalosporin use.    Patient Measurements: Height: 5\' 11"  (180.3 cm) Weight: 253 lb (114.8 kg) IBW/kg (Calculated) : 75.3  Vital Signs: Temp: 97.9 F (36.6 C) (06/11 0811) Temp Source: Oral (06/11 0811) BP: 120/61 (06/11 0811) Pulse Rate: 82 (06/11 0811)  Labs: Recent Labs    05/12/19 1426  05/12/19 1538  05/12/19 2318 05/13/19 0422 05/13/19 1054 05/14/19 0530  HGB 13.4  --   --   --   --   --   --   --   HCT 41.7  --   --   --   --   --   --   --   PLT 230  --   --   --   --   --   --   --   LABPROT  --   --  34.9*  --   --  29.8*  --  30.9*  INR  --   --  3.5*  --   --  2.9*  --  3.0*  CREATININE 2.03*  --   --   --   --  2.01*  --  2.40*  TROPONINI  --    < >  --    < > 0.13* 0.14* 0.11*  --    < > = values in this interval not displayed.    Estimated Creatinine Clearance: 36.9 mL/min (A) (by C-G formula based on SCr of 2.4 mg/dL (H)).  Assessment: 53 yoM admitted with SOB. Pt on warfarin PTA for hx mAVR and PAF. INR therapeutic at 3.5 on admit, remains at goal at 3 today. Noted that pt was started on Bactrim DS prior to admission for foot wound which can enhance effect of warfarin (duration of 10 days - to end 6/19)  *Home warfarin dose = 12.5mg  Tues/Thurs/Sat, 10mg  Mon/Wed/Fri/Sat  Goal  of Therapy:  INR goal 2.5-3.5 Monitor platelets by anticoagulation protocol: Yes   Plan:  Warfarin 10 mg x 1 (will continue slight reduction in dose d/t bactrim) Daily INR  Levester Fresh, PharmD, BCPS, BCCCP Clinical Pharmacist [phone]  Please check AMION for all [place] numbers  05/14/2019 10:15 AM

## 2019-05-14 NOTE — Progress Notes (Signed)
Progress Note  Patient Name: Joseph Hoffman Date of Encounter: 05/14/2019  Primary Cardiologist: Dr. Claiborne Billings  Subjective   Breathing better;  Inpatient Medications    Scheduled Meds:  cholecalciferol  1,000 Units Oral QHS   digoxin  0.125 mg Oral QHS   escitalopram  10 mg Oral QPM   ezetimibe  10 mg Oral QHS   fenofibrate  160 mg Oral Daily   furosemide  40 mg Intravenous BID   Icosapent Ethyl  2 g Oral BID   insulin aspart  0-9 Units Subcutaneous TID WC   insulin glargine  30 Units Subcutaneous BID   metoprolol succinate  37.5 mg Oral BID   mometasone-formoterol  2 puff Inhalation BID   multivitamin with minerals   Oral Daily   omega-3 acid ethyl esters  1 g Oral Daily   sodium chloride flush  3 mL Intravenous Q12H   sulfamethoxazole-trimethoprim  1 tablet Oral BID   vitamin C  1,000 mg Oral QHS   warfarin  10 mg Oral ONCE-1800   Warfarin - Pharmacist Dosing Inpatient   Does not apply q1800   Continuous Infusions:  sodium chloride     PRN Meds: sodium chloride, acetaminophen, albuterol, hydrALAZINE, morphine injection, nitroGLYCERIN, ondansetron (ZOFRAN) IV, sodium chloride flush   Vital Signs    Vitals:   05/14/19 0434 05/14/19 0802 05/14/19 0811 05/14/19 1027  BP:   120/61 133/74  Pulse:   82 63  Resp:   16   Temp:   97.9 F (36.6 C)   TempSrc:   Oral   SpO2:  98% 96% 97%  Weight: 114.8 kg     Height:        Intake/Output Summary (Last 24 hours) at 05/14/2019 1146 Last data filed at 05/14/2019 1035 Gross per 24 hour  Intake 1900 ml  Output 1800 ml  Net 100 ml    I/O since admission: -1067  Filed Weights   05/12/19 1822 05/13/19 0003 05/14/19 0434  Weight: 114.8 kg 114.7 kg 114.8 kg    Telemetry    AF in the 70s- Personally Reviewed  ECG    ECG (independently read by me): AF 71 with PVCs  Physical Exam   BP 133/74 (BP Location: Right Arm)    Pulse 63    Temp 97.9 F (36.6 C) (Oral)    Resp 16    Ht 5\' 11"  (1.803 m)     Wt 114.8 kg    SpO2 97%    BMI 35.29 kg/m  General: Alert, oriented, no distress.  Skin: normal turgor, no rashes, warm and dry HEENT: Normocephalic, atraumatic. Pupils equal round and reactive to light; sclera anicteric; extraocular muscles intact;  Nose without nasal septal hypertrophy Mouth/Parynx benign;  Neck: No JVD, no carotid bruits; normal carotid upstroke Lungs: clear to ausculatation and percussion; no wheezing or rales Chest wall: without tenderness to palpitation Heart: PMI not displaced, RRR, s1 s2 normal, 2/6 systolic murmur, no diastolic murmur, no rubs, gallops, thrills, or heaves Abdomen: soft, nontender; no hepatosplenomehaly, BS+; abdominal aorta nontender and not dilated by palpation. Back: no CVA tenderness Pulses 2+ Musculoskeletal: full range of motion, normal strength, no joint deformities Extremities: venous stasis changes; trace edema;  no clubbing, cyanosis, Homan's sign negative  Neurologic: grossly nonfocal; Cranial nerves grossly wnl Psychologic: Normal mood and affect   Labs    Chemistry Recent Labs  Lab 05/12/19 1426 05/13/19 0422 05/14/19 0530  NA 137 137 136  K 4.7 4.1 3.9  CL  103 107 99  CO2 22 21* 25  GLUCOSE 119* 169* 166*  BUN 43* 40* 47*  CREATININE 2.03* 2.01* 2.40*  CALCIUM 9.7 9.0 9.3  GFRNONAA 32* 33* 26*  GFRAA 37* 38* 31*  ANIONGAP 12 9 12      Hematology Recent Labs  Lab 05/12/19 1426  WBC 9.6  RBC 5.03  HGB 13.4  HCT 41.7  MCV 82.9  MCH 26.6  MCHC 32.1  RDW 17.0*  PLT 230    Cardiac Enzymes Recent Labs  Lab 05/12/19 1846 05/12/19 2318 05/13/19 0422 05/13/19 1054  TROPONINI 0.12* 0.13* 0.14* 0.11*   No results for input(s): TROPIPOC in the last 168 hours.   BNP Recent Labs  Lab 05/12/19 1426  BNP 297.0*     DDimer No results for input(s): DDIMER in the last 168 hours.   Lipid Panel     Component Value Date/Time   CHOL 111 05/13/2019 0422   CHOL 156 02/05/2019 1006   CHOL 166 08/23/2014 0853     TRIG 89 05/13/2019 0422   TRIG 274 (H) 08/23/2014 0853   HDL 19 (L) 05/13/2019 0422   HDL 23 (L) 02/05/2019 1006   HDL 26 (L) 08/23/2014 0853   CHOLHDL 5.8 05/13/2019 0422   VLDL 18 05/13/2019 0422   LDLCALC 74 05/13/2019 0422   LDLCALC 84 02/05/2019 1006   LDLCALC 85 08/23/2014 0853     Radiology    Dg Chest 2 View  Result Date: 05/12/2019 CLINICAL DATA:  Chest pain, shortness of breath, and leg swelling for few days, history CHF, coronary artery disease post MI, hypertension, diabetes mellitus, atrial fibrillation, prior AVR EXAM: CHEST - 2 VIEW COMPARISON:  04/09/2019 FINDINGS: Enlargement of cardiac silhouette post median sternotomy and AVR. Pulmonary vascular congestion.  No mediastinal contours normal. RIGHT pleural effusion and basilar atelectasis increased from prior study. Minimal interstitial prominence, likely representing mild chronic failure. No acute pulmonary edema or segmental consolidation. No pneumothorax. Scattered endplate spur formation thoracic spine. IMPRESSION: Enlargement of cardiac silhouette with slight pulmonary vascular congestion and minimal chronic failure. Slightly increased RIGHT pleural effusion and basilar atelectasis. Electronically Signed   By: Lavonia Dana M.D.   On: 05/12/2019 15:16   Nm Pulmonary Perfusion  Result Date: 05/12/2019 CLINICAL DATA:  Shortness of breath EXAM: NUCLEAR MEDICINE PERFUSION LUNG SCAN TECHNIQUE: Perfusion images were obtained in multiple projections after intravenous injection of radiopharmaceutical. Ventilation scans intentionally deferred if perfusion scan and chest x-ray adequate for interpretation during COVID 19 epidemic. RADIOPHARMACEUTICALS:  1.75 mCi Tc-26m MAA IV COMPARISON:  None FINDINGS: Enlargement of cardiac silhouette. Perfusion images are otherwise normal. No segmental or subsegmental perfusion defects. Ventilation exam not performed. IMPRESSION: Normal perfusion lung scan. Electronically Signed   By: Lavonia Dana M.D.    On: 05/12/2019 17:45   Vas [place] Lower Extremity Venous (dvt) (mc And Wl 7a-7p)  Result Date: 05/13/2019  Lower Venous Study Indications: Swelling, Erythema, and SOB.  Performing Technologist: Toma Copier RVS  Examination Guidelines: A complete evaluation includes B-mode imaging, spectral Doppler, color Doppler, and power Doppler as needed of all accessible portions of each vessel. Bilateral testing is considered an integral part of a complete examination. Limited examinations for reoccurring indications may be performed as noted.  +-----+---------------+---------+-----------+----------+-------+  RIGHT Compressibility Phasicity Spontaneity Properties Summary  +-----+---------------+---------+-----------+----------+-------+  CFV   Full            Yes       Yes                             +-----+---------------+---------+-----------+----------+-------+  SFJ   Full                                                      +-----+---------------+---------+-----------+----------+-------+   +---------+---------------+---------+-----------+----------+-------------------+  LEFT      Compressibility Phasicity Spontaneity Properties Summary              +---------+---------------+---------+-----------+----------+-------------------+  CFV       Full            Yes       Yes                                         +---------+---------------+---------+-----------+----------+-------------------+  SFJ       Full                                                                  +---------+---------------+---------+-----------+----------+-------------------+  FV Prox   Full            Yes       Yes                                         +---------+---------------+---------+-----------+----------+-------------------+  FV Mid    Full                                                                  +---------+---------------+---------+-----------+----------+-------------------+  FV Distal Full            Yes       Yes                                          +---------+---------------+---------+-----------+----------+-------------------+  PFV       Full            Yes       Yes                                         +---------+---------------+---------+-----------+----------+-------------------+  POP       Full            Yes       Yes                                         +---------+---------------+---------+-----------+----------+-------------------+  PTV       Full                                                                  +---------+---------------+---------+-----------+----------+-------------------+  PERO      Full                                             Difficult to image                                                               due to the                                                                       tightness of the                                                                 calf                 +---------+---------------+---------+-----------+----------+-------------------+   Left Technical Findings: Doppler waveform are pulsitile throughout consistent with a possible fluid overload.   Summary: Right: There is no evdence of a common femoral vein obstruction Left: There is no evidence of deep vein thrombosis in the lower extremity. There is no evdence of a popliteal cystic structure. See technical findings listed above.  *See table(s) above for measurements and observations. Electronically signed by Curt Jews MD on 05/13/2019 at 5:02:47 PM.    Final     Cardiac Studies   04/10/2019 ECHO IMPRESSIONS   1. The left ventricle has mildly reduced systolic function, with an ejection fraction of 45-50%. The cavity size was normal. There is moderately increased left ventricular wall thickness. Left ventricular diastolic function could not be evaluated  secondary to atrial fibrillation. Elevated left atrial and left ventricular end-diastolic pressures.  2. The right ventricle has mildly reduced systolic  function. The cavity was moderately enlarged. There is no increase in right ventricular wall thickness.  3. Left atrial size was severely dilated.  4. Right atrial size was severely dilated.  5. The mitral valve is abnormal. Mild thickening of the mitral valve leaflet. Mild calcification of the mitral valve leaflet. There is mild mitral annular calcification present. Mitral valve regurgitation is mild to moderate by color flow Doppler.  Mild-moderate mitral valve stenosis.  6. The tricuspid valve is grossly normal.  7. Moderate calcification of the aortic valve. Aortic valve regurgitation is mild by color flow Doppler. Moderate stenosis of the aortic valve.  8. The inferior vena cava was dilated in size with <50% respiratory variability.  Patient Profile     NINO AMANO is a 68 y.o. male with a hx of permanent Afib, DM2, HTN, OSA on CPAP, AVR in 2951, chronic systolic HF, and hepatitis who is being seen today for the evaluation of CHF at the request of Dr. Erlinda Hong.  Assessment & Plan  1. Dyspnea improved: BNP  297 suggestive of diastolic CHF.; pending today. He has not had any recent ischemic evaluation and with his underlying diabetes mellitus evaluation for potential ischemia should be undertaken. Cr increased to 2.4 today;  Will plan for lexiscan myoview tomorrow.  2. S/P AVR:  [employer] valve. Last echo mean gradient 15 and peak 29.5 mmHg.  3. Permanent AF since 1994 on warfarin anticoagulation.  4. Mild troponin: probable demand ischemia  5. Acute of chronic kidney insufficiency: Cr increased to 2.40; had been reduced to 1.7 in May.  6. OSA on CPAP with benefit with nocturnal O2 bleed in.   7. DM: on Jardiance, Victoza and metformin.  8. Mixed Hyperlipidemia: statin intolerant; on Vascepa, Zetia and fenofibrate with significant improvement.  Keep NPO in am for [facility].  Signed, Troy Sine, MD, [facility] 05/14/2019, 11:46 AM

## 2019-05-15 ENCOUNTER — Inpatient Hospital Stay (HOSPITAL_COMMUNITY): Payer: Medicare Other

## 2019-05-15 DIAGNOSIS — I2510 Atherosclerotic heart disease of native coronary artery without angina pectoris: Secondary | ICD-10-CM

## 2019-05-15 LAB — HEPATIC FUNCTION PANEL
ALT: 27 U/L (ref 0–44)
AST: 44 U/L — ABNORMAL HIGH (ref 15–41)
Albumin: 3.8 g/dL (ref 3.5–5.0)
Alkaline Phosphatase: 30 U/L — ABNORMAL LOW (ref 38–126)
Bilirubin, Direct: 0.5 mg/dL — ABNORMAL HIGH (ref 0.0–0.2)
Indirect Bilirubin: 0.8 mg/dL (ref 0.3–0.9)
Total Bilirubin: 1.3 mg/dL — ABNORMAL HIGH (ref 0.3–1.2)
Total Protein: 6.5 g/dL (ref 6.5–8.1)

## 2019-05-15 LAB — BASIC METABOLIC PANEL
Anion gap: 11 (ref 5–15)
BUN: 49 mg/dL — ABNORMAL HIGH (ref 8–23)
CO2: 20 mmol/L — ABNORMAL LOW (ref 22–32)
Calcium: 9.3 mg/dL (ref 8.9–10.3)
Chloride: 103 mmol/L (ref 98–111)
Creatinine, Ser: 1.91 mg/dL — ABNORMAL HIGH (ref 0.61–1.24)
GFR calc Af Amer: 40 mL/min — ABNORMAL LOW (ref >60)
GFR calc non Af Amer: 35 mL/min — ABNORMAL LOW (ref >60)
Glucose, Bld: 143 mg/dL — ABNORMAL HIGH (ref 70–99)
Potassium: 4.4 mmol/L (ref 3.5–5.1)
Sodium: 134 mmol/L — ABNORMAL LOW (ref 135–145)

## 2019-05-15 LAB — NM MYOCAR MULTI W/SPECT W/WALL MOTION / EF
MPHR: 150 {beats}/min
Peak HR: 93 {beats}/min
Percent HR: 62 %
Rest HR: 63 {beats}/min

## 2019-05-15 LAB — GLUCOSE, CAPILLARY
Glucose-Capillary: 138 mg/dL — ABNORMAL HIGH (ref 70–99)
Glucose-Capillary: 189 mg/dL — ABNORMAL HIGH (ref 70–99)

## 2019-05-15 LAB — PROTIME-INR
INR: 2.7 — ABNORMAL HIGH (ref 0.8–1.2)
Prothrombin Time: 28.4 seconds — ABNORMAL HIGH (ref 11.4–15.2)

## 2019-05-15 MED ORDER — TECHNETIUM TC 99M TETROFOSMIN IV KIT
10.0000 | PACK | Freq: Once | INTRAVENOUS | Status: AC | PRN
  Administered 2019-05-15: 10 via INTRAVENOUS

## 2019-05-15 MED ORDER — WARFARIN SODIUM 2.5 MG PO TABS: 12.5000 mg | ORAL_TABLET | Freq: Once | ORAL | Status: DC

## 2019-05-15 MED ORDER — TECHNETIUM TC 99M TETROFOSMIN IV KIT
30.0000 | PACK | Freq: Once | INTRAVENOUS | Status: AC | PRN
  Administered 2019-05-15: 30 via INTRAVENOUS

## 2019-05-15 MED ORDER — METOPROLOL SUCCINATE ER 25 MG PO TB24: 37.5000 mg | ORAL_TABLET | Freq: Two times a day (BID) | ORAL | 1 refills | Status: DC

## 2019-05-15 MED ORDER — SULFAMETHOXAZOLE-TRIMETHOPRIM 800-160 MG PO TABS: 1.0000 | ORAL_TABLET | Freq: Two times a day (BID) | ORAL | 0 refills | Status: DC

## 2019-05-15 MED ORDER — FENOFIBRATE 160 MG PO TABS: 160.0000 mg | ORAL_TABLET | Freq: Every day | ORAL | 1 refills | Status: AC

## 2019-05-15 MED ORDER — REGADENOSON 0.4 MG/5ML IV SOLN
0.4000 mg | Freq: Once | INTRAVENOUS | Status: AC
  Administered 2019-05-15: 0.4 mg via INTRAVENOUS
  Filled 2019-05-15: qty 5

## 2019-05-15 MED ORDER — REGADENOSON 0.4 MG/5ML IV SOLN
INTRAVENOUS | Status: AC
  Filled 2019-05-15: qty 5

## 2019-05-15 NOTE — Consult Note (Signed)
   [facility] CM Inpatient Consult   05/15/2019  Joseph Hoffman [birth year] [id number]   Patient screened for high risk score [22%] for less than 30 day unplanned readmission score.  Patient in the Medicare [facility] with  [facility].   Review of patient's medical record reveals patient is [per MD History and Physical] Joseph Hoffman is a 68 y.o. male with medical history significant of hypertension, diabetes mellitus, asthma, sCHF with EF 45%, CAD, atrial fibrillation on Coumadin, AS, s/p of AVR with mechanical valve on coumadin, OSA on CPAP, CKD stage III, depression with anxiety, who presents with shortness of breath.  Primary Care Provider is Orpah Melter, MD this provider does the transition of care.  Pharmacy is: Company secretary to provider: no issues  Spoke with the patient via hospital phone, HIPAA verified,  and he states that he would like follow up for getting oxygen for his CPAP machine and that Dr. Claiborne Billings and Dr. Maxwell Caul have been working to get this for a while.  Plan:  Patient will be assigned to a [facility] Coordinator for post hospital follow up. Reached out to inpatient [facility] RNCM regarding the need for his CPAP machine issues.  For questions contact:   Natividad Brood, RN BSN [facility] Liaison  [phone] business mobile phone Toll free office [phone]  Fax number: [phone] [name]

## 2019-05-15 NOTE — Discharge Summary (Signed)
Physician Discharge Summary  KOUA DEEG BEM:[id number] DOB: Jan 05, 1951 DOA: 05/12/2019  PCP: Orpah Melter, MD  Admit date: 05/12/2019 Discharge date: 05/15/2019  Time spent: 45 minutes  Recommendations for Outpatient Follow-up:  1. Follow up with cardiology as scheduled  2. Follow up PCP 1 week for evaluation of wound left toe   Discharge Diagnoses:  Principal Problem:   Acute on chronic systolic (congestive) heart failure (HCC) Active Problems:   Type II diabetes mellitus with renal manifestations (HCC)   Chest pain   Elevated troponin   CKD (chronic kidney disease), stage III ([place])   S/P AVR, 09/29/12, [employer]. (discharged 10/05/12)   Permanent atrial fibrillation, since 1994   Sleep apnea, on C-pap   Chronic anticoagulation, on Coumadin after [employer] AVR   Essential hypertension   Dyslipidemia, goal LDL below 70   Congestive heart failure (CHF) (HCC)   H/O heart valve replacement with mechanical valve   Discharge Condition: stable  Diet recommendation: heart healthy carb modified  Filed Weights   05/13/19 0003 05/14/19 0434 05/15/19 0535  Weight: 114.7 kg 114.8 kg 115.1 kg    History of present illness:  Joseph Hoffman is a 68 y.o. male with medical history significant of hypertension, diabetes mellitus, asthma, sCHF with EF 45%, CAD, atrial fibrillation on Coumadin, AS, s/p of AVR with mechanical valve on coumadin, OSA on CPAP, CKD stage III, depression with anxiety, who presented 6/9 with shortness of breath.  Patient stated that he had been having shortness of breath for about 1 week, which had been progressively worsening.  He denied cough, fever or chills.  Patient stated that he had one episode of mild chest pain that morning, which resolved quickly.  Patient reported bilateral leg edema (the left leg is worse than the right).  No nausea, vomiting, diarrhea, abdominal pain.  Patient stated that he  taking Bactrim for left toe foot wound (suppose to take from  6/3 to 6/12).   Hospital Course:  Acute on chronic systolic heart OFHQRFX:5O echo on 04/10/2019 showed EF of 45-50%. BNP297,vascular congestion on chest x-ray, bilateral leg edema, consistent with CHF exacerbation. VQ scan negative for PE. Venous Dopplers negative for DVT.No events on tele. Reported compliance with meds and denied dietary indiscretions. . Received  IV lasix and diuresed well. Volume status -900.  Wt stable. Voided 3960ml+ urine. Creatinine began to trend up so lasix IV stopped. Evaluated by cards who recommend lexiscan myoview. Results reveal high risk.Cleared for discharge per cardiology with OP follow up. Resume home lasix at discharge  S/P AVR, 09/29/12, [employer]. 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 On coumadin. INR 2.7 on day of discharge. Resume home coumadin dosing  Permanent atrial fibrillation, since 1994:CHA2DS2-VASc Scoreis 5.Patient is on Coumadin.Heart rate is well controlled.  Sleep apnea: -on C-pap  Dyslipidemia, goal LDL below 70: -[place] and zetia  ITG:PQDIYMEBRA. Home medicationsincludeMetoprololand lasix   Type II diabetes mellitus with renal manifestations (HCC):Last A1c9.3 on 04/10/19, poorly controled. Patient is takingLantus, Jardiance, Victoza, Amarylat home close OP follow up for optimal control  Chest painandElevated troponin:Patient statedthat he had one episode of mild chest pain thatcompletely resolved quickly.Patient has chronically elevated troponin, baseline 0.12-0.14 recently. ekg without acute changes. Evaluated by cardiology who opine likely related to demand ischemia. Stress test as noted above  CKD (chronic kidney disease), stage III (HCC):Baseline creatinine1.7-2.0. creatinine 1.9 at discharge.   Left foot wound:No fever or leukocytosis. Let pt complete current course of Bactrim   Procedures:  lexiscan myoview 6/12  Consultations:  Dr Claiborne Billings cards  Discharge Exam: Vitals:   05/15/19 0951  05/15/19 1117  BP: (!) 150/68 (!) 143/57  Pulse:  72  Resp:  16  Temp:  98.2 F (36.8 C)  SpO2:  93%    General: ambulating in room with steady gait no acute distress Cardiovascular: irregularly irregular +murmur and valve click trace LE edema Respiratory: normal effort BS clear bilaterally no wheeze  Discharge Instructions   Discharge Instructions    (HEART FAILURE PATIENTS) Call MD:  Anytime you have any of the following symptoms: 1) 3 pound weight gain in 24 hours or 5 pounds in 1 week 2) shortness of breath, with or without a dry hacking cough 3) swelling in the hands, feet or stomach 4) if you have to sleep on extra pillows at night in order to breathe.   Complete by: As directed    Call MD for:  difficulty breathing, headache or visual disturbances   Complete by: As directed    Call MD for:  persistant dizziness or light-headedness   Complete by: As directed    Diet - low sodium heart healthy   Complete by: As directed    Diet Carb Modified   Complete by: As directed    Discharge instructions   Complete by: As directed    Take medications as prescribed Follow up with cards as scheuduled   Heart Failure patients record your daily weight using the same scale at the same time of day   Complete by: As directed    Increase activity slowly   Complete by: As directed      Allergies as of 05/15/2019      Reactions   Iohexol Anaphylaxis   Niacin And Related    Flushing with immediate realese   Penicillins Other (See Comments)   Unknown.[name] Did it involve swelling of the face/tongue/throat, SOB, or low BP? Unknown Did it involve sudden or severe rash/hives, skin peeling, or any reaction on the inside of your mouth or nose? Unknown Did you need to seek medical attention at a hospital or doctor's office? Unknown When did it last happen?Childhood If all above answers are "NO", may proceed with cephalosporin use.      Medication List    TAKE  these medications   acetaminophen 325 MG tablet Commonly known as: TYLENOL Take 2 tablets (650 mg total) by mouth every 4 (four) hours as needed for pain or fever.   albuterol 108 (90 Base) MCG/ACT inhaler Commonly known as: VENTOLIN HFA Inhale 2 puffs into the lungs every 6 (six) hours as needed. For shortness of breath   B-D ULTRAFINE III SHORT PEN 31G X 8 MM Misc Generic drug: Insulin Pen Needle   CENTRUM SPECIALIST HEART PO Take 1 tablet by mouth daily.   cholecalciferol 1000 units tablet Commonly known as: VITAMIN D Take 1,000 Units by mouth at bedtime.   digoxin 0.125 MG tablet Commonly known as: LANOXIN TAKE 1 TABLET EVERY DAY What changed: when to take this   Easy Touch Lancets 30G/Twist Misc   empagliflozin 25 MG Tabs tablet Commonly known as: Jardiance Take 25 mg by mouth daily.   escitalopram 10 MG tablet Commonly known as: LEXAPRO Take 10 mg by mouth every evening.   ezetimibe 10 MG tablet Commonly known as: ZETIA Take 1 tablet (10 mg total) by mouth daily. What changed: when to take this   fenofibrate 160 MG tablet Take 1 tablet (160 mg total) by mouth daily.  Start taking on: May 16, 2019 What changed:   medication strength  how much to take   Fluticasone-Salmeterol 250-50 MCG/DOSE Aepb Commonly known as: ADVAIR Inhale 1 puff into the lungs as needed (shortness of breath). For asthma related symptoms   furosemide 40 MG tablet Commonly known as: LASIX Take 1 tablet (40 mg total) by mouth daily. Can take an additional tablet if weight increases by 2 lbs or more.   glimepiride 2 MG tablet Commonly known as: AMARYL Take 2 mg by mouth daily with breakfast.   GLUCOSAMINE 1500 COMPLEX PO Take 1,500 mg by mouth at bedtime.   Icosapent Ethyl 1 g Caps Commonly known as: Vascepa Take 2 capsules (2 g total) by mouth 2 (two) times daily.   insulin glargine 100 UNIT/ML injection Commonly known as: LANTUS Inject 45 Units into the skin 2 (two) times  daily.   metFORMIN 500 MG 24 hr tablet Commonly known as: GLUCOPHAGE-XR Take 2,000 mg by mouth at bedtime.   metoprolol succinate 25 MG 24 hr tablet Commonly known as: Toprol XL Take 1.5 tablets (37.5 mg total) by mouth 2 (two) times a day.   omega-3 acid ethyl esters 1 g capsule Commonly known as: LOVAZA Take 1 g by mouth daily.   sulfamethoxazole-trimethoprim 800-160 MG tablet Commonly known as: BACTRIM DS Take 1 tablet by mouth 2 (two) times daily. What changed: additional instructions   VICTOZA [place] Inject 1.8 mg into the skin at bedtime.   vitamin C 1000 MG tablet Take 1,000 mg by mouth at bedtime.   warfarin 10 MG tablet Commonly known as: COUMADIN Take 10 mg by mouth See admin instructions. Take Sun. Mon. Wed.. Fri What changed: Another medication with the same name was changed. Make sure you understand how and when to take each.   warfarin 5 MG tablet Commonly known as: COUMADIN Take as instructed with 10 mg. What changed:   how much to take  how to take this  when to take this  additional instructions      Allergies  Allergen Reactions  . Iohexol Anaphylaxis  . Niacin And Related     Flushing with immediate realese  . Penicillins Other (See Comments)    Unknown.[name]  Did it involve swelling of the face/tongue/throat, SOB, or low BP? Unknown Did it involve sudden or severe rash/hives, skin peeling, or any reaction on the inside of your mouth or nose? Unknown Did you need to seek medical attention at a hospital or doctor's office? Unknown When did it last happen?Childhood If all above answers are "NO", may proceed with cephalosporin use.   Follow-up Information    Orpah Melter, MD Follow up.   Specialty: Family Medicine Contact information: [place] [address] [phone]        Troy Sine, MD .   Specialty: Cardiology Contact information: [address] [place]  [place] [address] [phone]            The results of significant diagnostics from this hospitalization (including imaging, microbiology, ancillary and laboratory) are listed below for reference.    Significant Diagnostic Studies: Dg Chest 2 View  Result Date: 05/12/2019 CLINICAL DATA:  Chest pain, shortness of breath, and leg swelling for few days, history CHF, coronary artery disease post MI, hypertension, diabetes mellitus, atrial fibrillation, prior AVR EXAM: CHEST - 2 VIEW COMPARISON:  04/09/2019 FINDINGS: Enlargement of cardiac silhouette post median sternotomy and AVR. Pulmonary vascular congestion.  No  mediastinal contours normal. RIGHT pleural effusion and basilar atelectasis increased from prior study. Minimal interstitial prominence, likely representing mild chronic failure. No acute pulmonary edema or segmental consolidation. No pneumothorax. Scattered endplate spur formation thoracic spine. IMPRESSION: Enlargement of cardiac silhouette with slight pulmonary vascular congestion and minimal chronic failure. Slightly increased RIGHT pleural effusion and basilar atelectasis. Electronically Signed   By: Lavonia Dana M.D.   On: 05/12/2019 15:16   Nm Pulmonary Perfusion  Result Date: 05/12/2019 CLINICAL DATA:  Shortness of breath EXAM: NUCLEAR MEDICINE PERFUSION LUNG SCAN TECHNIQUE: Perfusion images were obtained in multiple projections after intravenous injection of radiopharmaceutical. Ventilation scans intentionally deferred if perfusion scan and chest x-ray adequate for interpretation during COVID 19 epidemic. RADIOPHARMACEUTICALS:  1.75 mCi Tc-24m MAA IV COMPARISON:  None FINDINGS: Enlargement of cardiac silhouette. Perfusion images are otherwise normal. No segmental or subsegmental perfusion defects. Ventilation exam not performed. IMPRESSION: Normal perfusion lung scan. Electronically Signed   By: Lavonia Dana M.D.   On: 05/12/2019 17:45   Nm Myocar Multi W/spect W/wall Motion / Ef  Result  Date: 05/15/2019 CLINICAL DATA:  coronary artery disease. Hypertension. Diabetes. Shortness of breath. EXAM: MYOCARDIAL IMAGING WITH SPECT (REST AND PHARMACOLOGIC-STRESS) GATED LEFT VENTRICULAR WALL MOTION STUDY LEFT VENTRICULAR EJECTION FRACTION TECHNIQUE: Standard myocardial SPECT imaging was performed after resting intravenous injection of 10 mCi Tc-4m tetrofosmin. Subsequently, intravenous infusion of Lexiscan was performed under the supervision of the Cardiology staff. At peak effect of the drug, 30 mCi Tc-95m tetrofosmin was injected intravenously and standard myocardial SPECT imaging was performed. Quantitative gated imaging was also performed to evaluate left ventricular wall motion, and estimate left ventricular ejection fraction. COMPARISON:  Chest radiograph 05/12/2019. FINDINGS: Perfusion: Fixed defect within the apex and apical segment of the inferior wall. This is moderate in size and medium in severity. No areas of reversibility to suggest inducible ischemia. Wall Motion: Global hypokinesis.  Left ventricular dilatation. Left Ventricular Ejection Fraction: 31 % End diastolic volume 102 ml End systolic volume 725 ml IMPRESSION: 1. No reversible ischemia. Fixed defect in the apex and apical inferior wall is likely related to prior infarct. 2. Global hypokinesis. 3. Left ventricular ejection fraction 31% 4. Non invasive risk stratification*: High-based on ejection fraction of less than 35% *2012 Appropriate Use Criteria for Coronary Revascularization Focused Update: J Am Coll Cardiol. 3664;40(3):474-259. http://content.airportbarriers.com.aspx?articleid=1201161 Electronically Signed   By: Abigail Miyamoto M.D.   On: 05/15/2019 12:28   Vas [place] Lower Extremity Venous (dvt) (mc And Wl 7a-7p)  Result Date: 05/13/2019  Lower Venous Study Indications: Swelling, Erythema, and SOB.  Performing Technologist: Toma Copier RVS  Examination Guidelines: A complete evaluation includes B-mode imaging, spectral  Doppler, color Doppler, and power Doppler as needed of all accessible portions of each vessel. Bilateral testing is considered an integral part of a complete examination. Limited examinations for reoccurring indications may be performed as noted.  +-----+---------------+---------+-----------+----------+-------+ RIGHTCompressibilityPhasicitySpontaneityPropertiesSummary +-----+---------------+---------+-----------+----------+-------+ CFV  Full           Yes      Yes                          +-----+---------------+---------+-----------+----------+-------+ SFJ  Full                                                 +-----+---------------+---------+-----------+----------+-------+   +---------+---------------+---------+-----------+----------+-------------------+ LEFT  CompressibilityPhasicitySpontaneityPropertiesSummary             +---------+---------------+---------+-----------+----------+-------------------+ CFV      Full           Yes      Yes                                      +---------+---------------+---------+-----------+----------+-------------------+ SFJ      Full                                                             +---------+---------------+---------+-----------+----------+-------------------+ FV Prox  Full           Yes      Yes                                      +---------+---------------+---------+-----------+----------+-------------------+ FV Mid   Full                                                             +---------+---------------+---------+-----------+----------+-------------------+ FV DistalFull           Yes      Yes                                      +---------+---------------+---------+-----------+----------+-------------------+ PFV      Full           Yes      Yes                                      +---------+---------------+---------+-----------+----------+-------------------+ POP      Full            Yes      Yes                                      +---------+---------------+---------+-----------+----------+-------------------+ PTV      Full                                                             +---------+---------------+---------+-----------+----------+-------------------+ PERO     Full                                         Difficult to image  due to the                                                                tightness of the                                                          calf                +---------+---------------+---------+-----------+----------+-------------------+   Left Technical Findings: Doppler waveform are pulsitile throughout consistent with a possible fluid overload.   Summary: Right: There is no evdence of a common femoral vein obstruction Left: There is no evidence of deep vein thrombosis in the lower extremity. There is no evdence of a popliteal cystic structure. See technical findings listed above.  *See table(s) above for measurements and observations. Electronically signed by Curt Jews MD on 05/13/2019 at 5:02:47 PM.    Final     Microbiology: Recent Results (from the past 240 hour(s))  Novel Coronavirus, NAA (hospital order; send-out to ref lab)     Status: None   Collection Time: 05/12/19  8:06 PM   Specimen: Nasopharyngeal Swab; Respiratory  Result Value Ref Range Status   SARS-CoV-2, NAA NOT DETECTED NOT DETECTED Final    Comment: (NOTE) This test was developed and its performance characteristics determined by [employer]. This test has not been FDA cleared or approved. This test has been authorized by FDA under an Emergency Use Authorization (EUA). This test is only authorized for the duration of time the declaration that circumstances exist justifying the authorization of the emergency use of in vitro diagnostic tests for detection of  SARS-CoV-2 virus and/or diagnosis of COVID-19 infection under section 564(b)(1) of the Act, 21 U.S.C. 220URK-2(H)(0), unless the authorization is terminated or revoked sooner. When diagnostic testing is negative, the possibility of a false negative result should be considered in the context of a patient's recent exposures and the presence of clinical signs and symptoms consistent with COVID-19. An individual without symptoms of COVID-19 and who is not shedding SARS-CoV-2 virus would expect to have a negative (not detected) result in this assay. Performed  At: [facility] [address] [place], [place] [id number] Rush Farmer MD DV:[id number]    [place]  Final    Comment: Performed at [facility], [place] [address], [place], [place] [address]     Labs: Basic Metabolic Panel: Recent Labs  Lab 05/12/19 1426 05/13/19 0422 05/14/19 0530 05/15/19 0558  NA 137 137 136 134*  K 4.7 4.1 3.9 4.4  CL 103 107 99 103  CO2 22 21* 25 20*  GLUCOSE 119* 169* 166* 143*  BUN 43* 40* 47* 49*  CREATININE 2.03* 2.01* 2.40* 1.91*  CALCIUM 9.7 9.0 9.3 9.3   Liver Function Tests: Recent Labs  Lab 05/15/19 0558  AST 44*  ALT 27  ALKPHOS 30*  BILITOT 1.3*  PROT 6.5  ALBUMIN 3.8   No results for input(s): LIPASE, AMYLASE in the last 168 hours. No results for input(s): AMMONIA in the last 168 hours. CBC: Recent Labs  Lab 05/12/19  1426  WBC 9.6  HGB 13.4  HCT 41.7  MCV 82.9  PLT 230   Cardiac Enzymes: Recent Labs  Lab 05/12/19 1509 05/12/19 1846 05/12/19 2318 05/13/19 0422 05/13/19 1054  TROPONINI 0.13* 0.12* 0.13* 0.14* 0.11*   BNP: BNP (last 3 results) Recent Labs    04/09/19 1317 05/12/19 1426  BNP 222.9* 297.0*    ProBNP (last 3 results) No results for input(s): PROBNP in the last 8760 hours.  CBG: Recent Labs  Lab 05/14/19 1136 05/14/19 1618 05/14/19 2140 05/15/19 0559 05/15/19 1115  GLUCAP 206* 253* 217* 138*  189*       Signed:  Radene Gunning NP [facility] 05/15/2019, 1:40 PM

## 2019-05-15 NOTE — Care Management Important Message (Signed)
Important Message  Patient Details  Name: TYMIER LINDHOLM MRN: [id number] Date of Birth: [birth year]   Medicare Important Message Given:  Yes    [name] 05/15/2019, 11:37 AM

## 2019-05-15 NOTE — Progress Notes (Signed)
ANTICOAGULATION CONSULT NOTE - Joseph Hoffman for warfarin Indication: atrial fibrillation and mechanical AVR  Allergies  Allergen Reactions  . Iohexol Anaphylaxis  . Niacin And Related     Flushing with immediate realese  . Penicillins Other (See Comments)    Unknown.Joseph Kitchenaortic stenosis a child  Did it involve swelling of the face/tongue/throat, SOB, or low BP? Unknown Did it involve sudden or severe rash/hives, skin peeling, or any reaction on the inside of your mouth or nose? Unknown Did you need to seek medical attention at a hospital or doctor's office? Unknown When did it last happen?Childhood If all above answers are "NO", may proceed with cephalosporin use.    Patient Measurements: Height: 5\' 11"  (180.3 cm) Weight: 253 lb 11.2 oz (115.1 kg) IBW/kg (Calculated) : 75.3  Vital Signs: Temp: 97.6 F (36.4 C) (06/12 0330) Temp Source: Oral (06/11 2359) BP: 115/58 (06/12 0816) Pulse Rate: 70 (06/12 0816)  Labs: Recent Labs    05/12/19 1426  05/12/19 2318 05/13/19 0422 05/13/19 1054 05/14/19 0530 05/15/19 0558  HGB 13.4  --   --   --   --   --   --   HCT 41.7  --   --   --   --   --   --   PLT 230  --   --   --   --   --   --   LABPROT  --    < >  --  29.8*  --  30.9* 28.4*  INR  --    < >  --  2.9*  --  3.0* 2.7*  CREATININE 2.03*  --   --  2.01*  --  2.40* 1.91*  TROPONINI  --    < > 0.13* 0.14* 0.11*  --   --    < > = values in this interval not displayed.    Estimated Creatinine Clearance: 46.4 mL/min (A) (by C-G formula based on SCr of 1.91 mg/dL (H)).  Assessment: 60 yoM admitted with SOB. Pt on warfarin PTA for hx mAVR and PAF. INR therapeutic at 3.5 on admit, remains at goal at 3 today. Noted that pt was started on Bactrim DS prior to admission for foot wound which can enhance effect of warfarin (duration of 10 days)  *Home warfarin dose = 12.5mg  Tues/Thurs/Sat, 10mg  Mon/Wed/Fri/Sat  Goal of Therapy:  INR goal 2.5-3.5 Monitor  platelets by anticoagulation protocol: Yes   Plan:  Warfarin 12.5 mg x 1  Daily INR  Joseph Hoffman, [facility] Clinical Pharmacist Phone [phone]  05/15/2019 9:34 AM

## 2019-05-15 NOTE — Progress Notes (Addendum)
Progress Note  Patient Name: Joseph Hoffman Date of Encounter: 05/15/2019  Primary Cardiologist: Shelva Majestic, MD   Subjective   No chest pain overnight, breathing is much improved.   Inpatient Medications    Scheduled Meds: . cholecalciferol  1,000 Units Oral QHS  . digoxin  0.125 mg Oral QHS  . escitalopram  10 mg Oral QPM  . ezetimibe  10 mg Oral QHS  . fenofibrate  160 mg Oral Daily  . Icosapent Ethyl  2 g Oral BID  . insulin aspart  0-9 Units Subcutaneous TID WC  . insulin aspart  3 Units Subcutaneous TID WC  . insulin glargine  30 Units Subcutaneous BID  . metoprolol succinate  37.5 mg Oral BID  . mometasone-formoterol  2 puff Inhalation BID  . multivitamin with minerals   Oral Daily  . omega-3 acid ethyl esters  1 g Oral Daily  . regadenoson  0.4 mg Intravenous Once  . sodium chloride flush  3 mL Intravenous Q12H  . sulfamethoxazole-trimethoprim  1 tablet Oral BID  . vitamin C  1,000 mg Oral QHS  . warfarin  12.5 mg Oral ONCE-1800  . Warfarin - Pharmacist Dosing Inpatient   Does not apply q1800   Continuous Infusions: . sodium chloride     PRN Meds: sodium chloride, acetaminophen, albuterol, hydrALAZINE, morphine injection, nitroGLYCERIN, ondansetron (ZOFRAN) IV, sodium chloride flush   Vital Signs    Vitals:   05/15/19 0330 05/15/19 0535 05/15/19 0550 05/15/19 0816  BP: (!) 95/59  119/64 (!) 115/58  Pulse: 83   70  Resp: 20   15  Temp: 97.6 F (36.4 C)     TempSrc:      SpO2: (!) 86%  100% 98%  Weight:  115.1 kg    Height:        Intake/Output Summary (Last 24 hours) at 05/15/2019 0937 Last data filed at 05/15/2019 0300 Gross per 24 hour  Intake 1480 ml  Output 1425 ml  Net 55 ml   Last 3 Weights 05/15/2019 05/14/2019 05/13/2019  Weight (lbs) 253 lb 11.2 oz 253 lb 252 lb 14.4 oz  Weight (kg) 115.078 kg 114.76 kg 114.715 kg      Telemetry    Afib rate controlled - Personally Reviewed  ECG    Afib - Personally Reviewed  Physical Exam    GEN: No acute distress.   Neck: No JVD Cardiac: Irreg Irreg, 2/6 systolic murmur + valve click, rubs, or gallops.  Respiratory: Clear to auscultation bilaterally. GI: Soft, nontender, non-distended  MS: No edema; No deformity. Neuro:  Nonfocal  Psych: Normal affect   Labs    Chemistry Recent Labs  Lab 05/13/19 0422 05/14/19 0530 05/15/19 0558  NA 137 136 134*  K 4.1 3.9 4.4  CL 107 99 103  CO2 21* 25 20*  GLUCOSE 169* 166* 143*  BUN 40* 47* 49*  CREATININE 2.01* 2.40* 1.91*  CALCIUM 9.0 9.3 9.3  PROT  --   --  6.5  ALBUMIN  --   --  3.8  AST  --   --  44*  ALT  --   --  27  ALKPHOS  --   --  30*  BILITOT  --   --  1.3*  GFRNONAA 33* 26* 35*  GFRAA 38* 31* 40*  ANIONGAP 9 12 11      Hematology Recent Labs  Lab 05/12/19 1426  WBC 9.6  RBC 5.03  HGB 13.4  HCT 41.7  MCV 82.9  MCH 26.6  MCHC 32.1  RDW 17.0*  PLT 230    Cardiac Enzymes Recent Labs  Lab 05/12/19 1846 05/12/19 2318 05/13/19 0422 05/13/19 1054  TROPONINI 0.12* 0.13* 0.14* 0.11*   No results for input(s): TROPIPOC in the last 168 hours.   BNP Recent Labs  Lab 05/12/19 1426  BNP 297.0*     DDimer No results for input(s): DDIMER in the last 168 hours.   Radiology    No results found.  Cardiac Studies   04/10/2019 ECHO IMPRESSIONS  1. The left ventricle has mildly reduced systolic function, with an ejection fraction of 45-50%. The cavity size was normal. There is moderately increased left ventricular wall thickness. Left ventricular diastolic function could not be evaluated  secondary to atrial fibrillation. Elevated left atrial and left ventricular end-diastolic pressures. 2. The right ventricle has mildly reduced systolic function. The cavity was moderately enlarged. There is no increase in right ventricular wall thickness. 3. Left atrial size was severely dilated. 4. Right atrial size was severely dilated. 5. The mitral valve is abnormal. Mild thickening of the mitral valve  leaflet. Mild calcification of the mitral valve leaflet. There is mild mitral annular calcification present. Mitral valve regurgitation is mild to moderate by color flow Doppler.  Mild-moderate mitral valve stenosis. 6. The tricuspid valve is grossly normal. 7. Moderate calcification of the aortic valve. Aortic valve regurgitation is mild by color flow Doppler. Moderate stenosis of the aortic valve. 8. The inferior vena cava was dilated in size with <50% respiratory variability.  Patient Profile     68 y.o. male with a hx of permanent Afib, DM2, HTN, OSA on CPAP, AVR in 5009, chronic systolic HF, and hepatitiswho is being seen today for the evaluation of CHFat the request of Dr. Erlinda Hong.  Assessment & Plan    1. Dyspnea: improved BNP  297 suggestive of diastolic CHF.; pending today. He has not had any recent ischemic evaluation and with his underlying diabetes mellitus evaluation for potential ischemia should be undertaken. Cr 1.9 today. -- seen in nuc med for [place]...results pending.  2. S/P AVR:  St Jude valve. Last echo mean gradient 15 and peak 29.5 mmHg. -- INR 2.7  3. Permanent AF: since 1994 on warfarin anticoagulation.  4. Mild troponin: probable demand ischemia  5. Acute of chronic kidney insufficiency: Cr increased to 2.40>>1.94; had been reduced to 1.7 in May.  6. OSA on CPAP: with benefit with nocturnal O2 bleed in.   7. DM: on Jardiance, Victoza and metformin.  8. Mixed Hyperlipidemia: statin intolerant; on Vascepa, Zetia and fenofibrate with significant improvement.   For questions or updates, please contact [place] Please consult [email or web address] for contact info under    Signed, Reino Bellis, NP  05/15/2019, 9:37 AM    Patient seen and examined. Agree with assessment and plan. Breathing much better; feels well. Just completed lexiscan myoview; results pending. No DVT on doppler; trivial residual ankle edema. Cr improved to 1.9. Await nuclear  results.   ? DC later today if no significant ischemia   Troy Sine, MD, [facility] 05/15/2019 11:36 AM

## 2019-05-18 ENCOUNTER — Telehealth: Payer: Self-pay | Admitting: Cardiovascular Disease

## 2019-05-18 ENCOUNTER — Other Ambulatory Visit: Payer: Self-pay | Admitting: *Deleted

## 2019-05-18 NOTE — Telephone Encounter (Signed)
LMTCB

## 2019-05-18 NOTE — Telephone Encounter (Signed)
Spoke with wife - she states  Dr Claiborne Billings was to order  Oxygen for her husband ( HE NEEDS IT FOR NIGHT ONLY PER WIFE)  -he was discharge from the hospital last week  05/15/19 O2 sat was 86 %   WIFE AWARE WILL NEED TO DEFER TO DR kELLY FOR VERBAL ORDER

## 2019-05-18 NOTE — Telephone Encounter (Signed)
New Message            Patient is calling to get some oxygen ordered patient states he can't sleep it keeps him up and they gave him oxygen in the hospital and it helped a great deal and the patient would like some ordered please.

## 2019-05-18 NOTE — Patient Outreach (Signed)
[place] [facility]) Care Management  05/18/2019  DACOTAH CABELLO 03/13/1951 [id number]   Referral received 05/15/2019 Initial Outreach 05/18/2019  RN attempted outreach call to pt today however unsuccessful. RN able to leave a HIPAA aproved voice requesting a call back.  Will send outreach letter and will rescheduled another call back over the next week.  Raina Mina, RN Care Management Coordinator [place] Office [phone]

## 2019-05-19 NOTE — Telephone Encounter (Signed)
Patient was recently readmitted to the hospital with increasing shortness of breath.  On his 2 recent hospitalizations he is required bleeding oxygen into his CPAP machine.  Most recent O2 desaturation 86%.  Initiate nocturnal supplemental oxygen 2 L/min bled into his CPAP unit

## 2019-05-19 NOTE — Telephone Encounter (Signed)
spoke to patient -he is aware that an order will be sent for oxygen    his has received his cpap machine from [place].

## 2019-05-20 NOTE — Telephone Encounter (Signed)
Patient aware that he has to have O2 levels at rest and walking before it can be ordered. I placed patient on APP list for Monday morning- so we can have documented proof from a provider of his drop in O2, he has been in the hospital recently, and should be evaluated from that-  Lovena Le- please make sure we get his O2 levels at this visit.   Will route to PA and assistant to make them aware.

## 2019-05-20 NOTE — Telephone Encounter (Signed)
Patient will need to have O2 recorded at rest and with walking documented before O2 can be ordered.

## 2019-05-22 ENCOUNTER — Telehealth: Payer: Self-pay | Admitting: Cardiovascular Disease

## 2019-05-22 ENCOUNTER — Other Ambulatory Visit: Payer: Self-pay | Admitting: *Deleted

## 2019-05-22 ENCOUNTER — Encounter: Payer: Self-pay | Admitting: *Deleted

## 2019-05-22 NOTE — Telephone Encounter (Addendum)
Pt wife called to report that the pts O2 levels have proven to Dr. Maxwell Caul that he needs home O2.. he is going to mange it for the pt.   Appt for the O2 appt our office has been cancelled for 05/25/19. Pt to keep original follow up 06/01/19 and will come in prior for his labs fasting ordered 04/20/19.   Noticed 06/01/19 appt must have been cancelled when 05/25/19 appt made.. will forward to Dr. Evette Georges nurse to see if we can put pt back on that date or another soon after.

## 2019-05-22 NOTE — Telephone Encounter (Signed)
Called patient, placed back onto the schedule for Dr.Kelly on 06/29 Patient wife verbalized understanding.

## 2019-05-22 NOTE — Patient Outreach (Signed)
[name] [facility]) Care Management  05/22/2019  Joseph Hoffman 07-Oct-1951 [id number]    Transition of care (2nd attempt)  RN attempted outreach call today however pt sleeping. RN offered to follow up later today and the person on the phone indicated that would be good due to pt not sleeping well at night. Will reschedule another outreach this afternoon.  Raina Mina, RN Care Management Coordinator [place] Office [phone]

## 2019-05-22 NOTE — Patient Outreach (Signed)
[place] [facility]) Care Management  05/22/2019  Joseph Hoffman 1951/07/02 [id number]    Referral received 05/15/2019 2nd outreach attempt 05/22/2019 Providers office to completed Transition of care Horris Latino [place] notified)  RN spoke with pt and introduced the [facility] program and available services. Pt provided permission to speak with spouse Joseph Hoffman) concerning details on pt's medical history and recent discharge from the hospital. Pt identifiers were obtained prior to speaking with pt's spouse. Pt was able to provide information concerning his current status with all pending appointments and discussed his recent issues with needing an oxygen tank in with his CPAP. Pt gets SOB with his talking at this time and requested RN to speak with his spouse Joseph Hoffman). RN reintroduced the [facility] program and purpose for today's call. Wife Joseph Hoffman) indicated pt pt is doing better however several issues were discussed and pending concerning pt's ongoing management of care.   COPD-Pt indicated his was ASTHMA and verified his is taking all his medications and inhalers to prevent flare-ups. States he has been approved for home O2 and awaiting this process from his provider's office. Note pt also recovering from pneumonia post-op hospitalization. DM-Pt states Dr.Kelly (CAD) is pending a fasting BS with several other labs on 06/01/2019. Epic indicates last A1c was 8.7 on 05/13/2019 down from 9.3 1 month ago. Wife states pt's BS have been "wonderful" since pt's recent discharge with readings around 120-130.  HF-Wife and pt have indicated left ankle and foot are swollen however this is a long 2 month history of swelling. Reports pt weighs daily and aware of the action zones however RN reiterated on the zones and verified pt is in the GREEN zone. RN also educated on the importance of a low sodium or heart hearty diet to lower the risk of ongoing risk to his extremities. All zones were discussed for knowledge base and RN  strongly encourage pt to contact his provider with 3 lbs gained overnight or 5 lbs within one week (verbalized an understanding).  Based upon the above information RN offered to follow up with a plan of care generated on prevention measures with HF admission along with adherence with his medication and medical appointments (pt/caregiver agree). Will attempt to completed the initial assessment and follow up next week on missing elements with the care plan in place. Will inquired on any other needs for pharmacy or social work with [facility] and notify [name] of pt's disposition with [facility] services. No other inquired or request at this time as both the pt and his spouse were very appreciative and grateful for the call today. Plan of care noted.  THN CM Care Plan Problem One     Most Recent Value  Care Plan Problem One  Hospital prevention and knowledge deficit related to CHF exacerbation  Role Documenting the Problem One  Care Management Coordinator  Care Plan for Problem One  Active  THN Long Term Goal   Pt will not a hospitalization in the next 90 days with increase knowledge based on CHF.  THN Long Term Goal Start Date  05/22/19  Interventions for Problem One Long Term Goal  WIll educate on CHF action zones and the importance of daily monitoring to prevent acute events from occurring. Will provide Tools to assist with managing this condition.  THN CM Short Term Goal #1   Pt will weight daily and document all weights over the next 30 days.  THN CM Short Term Goal #1 Start Date  05/22/19  Interventions for  Short Term Goal #1  Will stress the importance of daily weights and documenting all readings for his provider to view. Will alert caregiver to relay to pt on what to do if acute fluid retention should occur. Will education on compression stockings and encouraged to inquired his provider on applying the TEDs when needed to prevent ongoing swelling or reduce the current swelling to his LE.  THN CM Short  Term Goal #2   Adherence with post-op medical appointments over the next 30 days.  THN CM Short Term Goal #2 Start Date  05/22/19  Interventions for Short Term Goal #2  Will stress the importance of attending all scheduled post-op medical appointments to prevent acute events. Will review all pending appointments and verify pt has sufficient transportation.  THN CM Short Term Goal #3  Adherenc ewith post-op medication administration within the next 30 days.  THN CM Short Term Goal #3 Start Date  05/22/19  Interventions for Short Tern Goal #3  Will review and verify pt has sifficent supply on all his medications and understanding the importance of adhering to the prescribed medications.      Raina Mina, RN Care Management Coordinator [place] Office [phone]

## 2019-05-22 NOTE — Telephone Encounter (Signed)
  Patient had his oxygen tested by Dr Maxwell Caul and he says that Dr Maxwell Caul is reviewing the results but he is going to need oxygen

## 2019-05-25 ENCOUNTER — Ambulatory Visit: Payer: Medicare Other | Admitting: Physician Assistant

## 2019-05-25 DIAGNOSIS — R0902 Hypoxemia: Secondary | ICD-10-CM | POA: Diagnosis not present

## 2019-05-27 ENCOUNTER — Emergency Department (HOSPITAL_COMMUNITY): Payer: Medicare Other

## 2019-05-27 ENCOUNTER — Inpatient Hospital Stay (HOSPITAL_COMMUNITY)
Admission: EM | Admit: 2019-05-27 | Discharge: 2019-05-30 | DRG: 291 | Disposition: A | Discharge status: Home / Self Care | Payer: Medicare Other | Attending: Internal Medicine | Admitting: Internal Medicine

## 2019-05-27 ENCOUNTER — Other Ambulatory Visit: Payer: Self-pay | Admitting: *Deleted

## 2019-05-27 ENCOUNTER — Encounter (HOSPITAL_COMMUNITY): Payer: Self-pay | Admitting: *Deleted

## 2019-05-27 ENCOUNTER — Other Ambulatory Visit: Payer: Self-pay

## 2019-05-27 DIAGNOSIS — Z20828 Contact with and (suspected) exposure to other viral communicable diseases: Secondary | ICD-10-CM | POA: Diagnosis present

## 2019-05-27 DIAGNOSIS — R7989 Other specified abnormal findings of blood chemistry: Secondary | ICD-10-CM | POA: Diagnosis not present

## 2019-05-27 DIAGNOSIS — Z794 Long term (current) use of insulin: Secondary | ICD-10-CM

## 2019-05-27 DIAGNOSIS — I5043 Acute on chronic combined systolic (congestive) and diastolic (congestive) heart failure: Secondary | ICD-10-CM

## 2019-05-27 DIAGNOSIS — Z888 Allergy status to other drugs, medicaments and biological substances status: Secondary | ICD-10-CM

## 2019-05-27 DIAGNOSIS — I5023 Acute on chronic systolic (congestive) heart failure: Secondary | ICD-10-CM | POA: Diagnosis present

## 2019-05-27 DIAGNOSIS — J45909 Unspecified asthma, uncomplicated: Secondary | ICD-10-CM | POA: Diagnosis present

## 2019-05-27 DIAGNOSIS — Z9049 Acquired absence of other specified parts of digestive tract: Secondary | ICD-10-CM

## 2019-05-27 DIAGNOSIS — Z87892 Personal history of anaphylaxis: Secondary | ICD-10-CM

## 2019-05-27 DIAGNOSIS — I4821 Permanent atrial fibrillation: Secondary | ICD-10-CM | POA: Diagnosis present

## 2019-05-27 DIAGNOSIS — Z9989 Dependence on other enabling machines and devices: Secondary | ICD-10-CM | POA: Diagnosis present

## 2019-05-27 DIAGNOSIS — L97529 Non-pressure chronic ulcer of other part of left foot with unspecified severity: Secondary | ICD-10-CM | POA: Diagnosis present

## 2019-05-27 DIAGNOSIS — R079 Chest pain, unspecified: Secondary | ICD-10-CM | POA: Diagnosis present

## 2019-05-27 DIAGNOSIS — F329 Major depressive disorder, single episode, unspecified: Secondary | ICD-10-CM | POA: Diagnosis present

## 2019-05-27 DIAGNOSIS — Z87891 Personal history of nicotine dependence: Secondary | ICD-10-CM

## 2019-05-27 DIAGNOSIS — Z7951 Long term (current) use of inhaled steroids: Secondary | ICD-10-CM

## 2019-05-27 DIAGNOSIS — Z7901 Long term (current) use of anticoagulants: Secondary | ICD-10-CM

## 2019-05-27 DIAGNOSIS — M7989 Other specified soft tissue disorders: Secondary | ICD-10-CM | POA: Diagnosis present

## 2019-05-27 DIAGNOSIS — Z952 Presence of prosthetic heart valve: Secondary | ICD-10-CM

## 2019-05-27 DIAGNOSIS — Z8349 Family history of other endocrine, nutritional and metabolic diseases: Secondary | ICD-10-CM

## 2019-05-27 DIAGNOSIS — N183 Chronic kidney disease, stage 3 unspecified: Secondary | ICD-10-CM | POA: Diagnosis present

## 2019-05-27 DIAGNOSIS — I110 Hypertensive heart disease with heart failure: Secondary | ICD-10-CM | POA: Diagnosis not present

## 2019-05-27 DIAGNOSIS — Z833 Family history of diabetes mellitus: Secondary | ICD-10-CM

## 2019-05-27 DIAGNOSIS — G4733 Obstructive sleep apnea (adult) (pediatric): Secondary | ICD-10-CM | POA: Diagnosis present

## 2019-05-27 DIAGNOSIS — I248 Other forms of acute ischemic heart disease: Secondary | ICD-10-CM | POA: Diagnosis not present

## 2019-05-27 DIAGNOSIS — R778 Other specified abnormalities of plasma proteins: Secondary | ICD-10-CM | POA: Diagnosis present

## 2019-05-27 DIAGNOSIS — M1990 Unspecified osteoarthritis, unspecified site: Secondary | ICD-10-CM | POA: Diagnosis present

## 2019-05-27 DIAGNOSIS — I493 Ventricular premature depolarization: Secondary | ICD-10-CM | POA: Diagnosis present

## 2019-05-27 DIAGNOSIS — E11621 Type 2 diabetes mellitus with foot ulcer: Secondary | ICD-10-CM | POA: Diagnosis present

## 2019-05-27 DIAGNOSIS — Z8249 Family history of ischemic heart disease and other diseases of the circulatory system: Secondary | ICD-10-CM

## 2019-05-27 DIAGNOSIS — E785 Hyperlipidemia, unspecified: Secondary | ICD-10-CM | POA: Diagnosis present

## 2019-05-27 DIAGNOSIS — F32A Depression, unspecified: Secondary | ICD-10-CM | POA: Diagnosis present

## 2019-05-27 DIAGNOSIS — I2510 Atherosclerotic heart disease of native coronary artery without angina pectoris: Secondary | ICD-10-CM | POA: Diagnosis present

## 2019-05-27 DIAGNOSIS — I428 Other cardiomyopathies: Secondary | ICD-10-CM | POA: Diagnosis not present

## 2019-05-27 DIAGNOSIS — I130 Hypertensive heart and chronic kidney disease with heart failure and stage 1 through stage 4 chronic kidney disease, or unspecified chronic kidney disease: Principal | ICD-10-CM | POA: Diagnosis present

## 2019-05-27 DIAGNOSIS — I252 Old myocardial infarction: Secondary | ICD-10-CM

## 2019-05-27 DIAGNOSIS — Z880 Allergy status to penicillin: Secondary | ICD-10-CM

## 2019-05-27 DIAGNOSIS — Z79899 Other long term (current) drug therapy: Secondary | ICD-10-CM

## 2019-05-27 DIAGNOSIS — E1122 Type 2 diabetes mellitus with diabetic chronic kidney disease: Secondary | ICD-10-CM | POA: Diagnosis present

## 2019-05-27 DIAGNOSIS — R0602 Shortness of breath: Secondary | ICD-10-CM | POA: Diagnosis not present

## 2019-05-27 DIAGNOSIS — Z9981 Dependence on supplemental oxygen: Secondary | ICD-10-CM

## 2019-05-27 DIAGNOSIS — I10 Essential (primary) hypertension: Secondary | ICD-10-CM | POA: Diagnosis present

## 2019-05-27 DIAGNOSIS — E1129 Type 2 diabetes mellitus with other diabetic kidney complication: Secondary | ICD-10-CM | POA: Diagnosis present

## 2019-05-27 LAB — TROPONIN I (HIGH SENSITIVITY): Troponin I (High Sensitivity): 63 ng/L — ABNORMAL HIGH (ref <18)

## 2019-05-27 LAB — BASIC METABOLIC PANEL
Anion gap: 11 (ref 5–15)
BUN: 49 mg/dL — ABNORMAL HIGH (ref 8–23)
CO2: 21 mmol/L — ABNORMAL LOW (ref 22–32)
Calcium: 9.3 mg/dL (ref 8.9–10.3)
Chloride: 104 mmol/L (ref 98–111)
Creatinine, Ser: 1.68 mg/dL — ABNORMAL HIGH (ref 0.61–1.24)
GFR calc Af Amer: 47 mL/min — ABNORMAL LOW (ref >60)
GFR calc non Af Amer: 41 mL/min — ABNORMAL LOW (ref >60)
Glucose, Bld: 235 mg/dL — ABNORMAL HIGH (ref 70–99)
Potassium: 4.8 mmol/L (ref 3.5–5.1)
Sodium: 136 mmol/L (ref 135–145)

## 2019-05-27 LAB — CBC
HCT: 42.2 % (ref 39.0–52.0)
Hemoglobin: 13.1 g/dL (ref 13.0–17.0)
MCH: 26.5 pg (ref 26.0–34.0)
MCHC: 31 g/dL (ref 30.0–36.0)
MCV: 85.4 fL (ref 80.0–100.0)
Platelets: 204 10*3/uL (ref 150–400)
RBC: 4.94 MIL/uL (ref 4.22–5.81)
RDW: 18.2 % — ABNORMAL HIGH (ref 11.5–15.5)
WBC: 9.2 10*3/uL (ref 4.0–10.5)
nRBC: 0 % (ref 0.0–0.2)

## 2019-05-27 MED ORDER — SODIUM CHLORIDE 0.9% FLUSH
3.0000 mL | Freq: Once | INTRAVENOUS | Status: AC
  Administered 2019-05-28: 3 mL via INTRAVENOUS

## 2019-05-27 NOTE — ED Notes (Signed)
Pt placed on 2LPM via [place] for comfort. Sat 95% RA. Has oxygen at home.

## 2019-05-27 NOTE — ED Triage Notes (Signed)
Pt reports L sided chest pain starting tonight. Pain is intermittent; reports sob; bilateral edema noted. Pt takes diuretic x1 daily, has taken diuretic x3 today with little urinary output. Reports needing a cath but has had elevated liver tests and has been unable to have cath. Dr.Kelly is pts cardiologist, has appt on Monday

## 2019-05-27 NOTE — Patient Outreach (Signed)
[place] [facility]) Care Management  05/27/2019  MATTHEO SWINDLE 1951-08-10 [id number]   Telephone Assessment - HF  RN spoke with pt's primary caregiver spouse [name]) and received an update on pt's ongoing management of care. Caregiver reports pt did received his O2 yesterday and did well through the night however pt very tirer today and using his O2. Caregiver also reports pt has gained 2 lbs today and pt has activated his action plan recommended by his provider to take an extra fluid medication with any weight gained or SOB. Due to pt's SOB pt cancelled his wound care appointment but will rescheduled. RN strongly encouraged caregiver to observe the site to prevent any risk of infections with any odor, fevers or changes to the site to be reported to pt's provider for examination. Plan of care discussed and updated accordingly as additional missing elements obtained for the completion of the initial assessment. Reiterated on goals and interventions adjusted accordingly based upon pt's progress and issues that have occurred.   Will follow up next month with ongoing case management services continue to offer tools to assist. The following plan of care as followed.  THN CM Care Plan Problem One     Most Recent Value  Care Plan Problem One  Hospital prevention and knowledge deficit related to CHF exacerbation  Role Documenting the Problem One  Care Management Coordinator  Care Plan for Problem One  Active  THN Long Term Goal   Pt will not a hospitalization in the next 90 days with increase knowledge based on CHF.  THN Long Term Goal Start Date  05/22/19  Interventions for Problem One Long Term Goal  Will continue to inquired on any acute symptoms and verify pt's awareness with responding to his provider for early interventions. Will stress the importance of earlier intervention sto avoid hospitalization. Will recite pt's action plan and to follow his provider's recommendations with  suggestive orders.   THN CM Short Term Goal #1   Pt will weight daily and document all weights over the next 30 days.  THN CM Short Term Goal #1 Start Date  05/22/19  Interventions for Short Term Goal #1  Will continue to extend this goal and stress daily weights and when to take action with acute symptoms. Will re-evaluate next month on pt's ongoing progress with his management of care. Will continue to ongoing monitoring of fluid retention and report 3 lbs overnight or 5 lbs within one week to pt's provider with any anute symptoms.  THN CM Short Term Goal #2   Adherence with post-op medical appointments over the next 30 days.  THN CM Short Term Goal #2 Start Date  05/22/19  Interventions for Short Term Goal #2  Will extend this goal to allow ongoing adherence based upon pending appointments. Will continue to encouraged ongoing adherence with all virtual and telehealth visits with pt's providers.   THN CM Short Term Goal #3  Adherenc ewith post-op medication administration within the next 30 days.  THN CM Short Term Goal #3 Start Date  05/22/19  [facility] CM Short Term Goal #3 Met Date  05/27/19      Raina Mina, RN Care Management Coordinator [place] Office [phone]

## 2019-05-28 ENCOUNTER — Encounter (HOSPITAL_COMMUNITY): Payer: Self-pay | Admitting: *Deleted

## 2019-05-28 DIAGNOSIS — Z794 Long term (current) use of insulin: Secondary | ICD-10-CM | POA: Diagnosis not present

## 2019-05-28 DIAGNOSIS — F32A Depression, unspecified: Secondary | ICD-10-CM | POA: Diagnosis present

## 2019-05-28 DIAGNOSIS — F329 Major depressive disorder, single episode, unspecified: Secondary | ICD-10-CM | POA: Diagnosis present

## 2019-05-28 DIAGNOSIS — I493 Ventricular premature depolarization: Secondary | ICD-10-CM | POA: Diagnosis present

## 2019-05-28 DIAGNOSIS — E11621 Type 2 diabetes mellitus with foot ulcer: Secondary | ICD-10-CM | POA: Diagnosis present

## 2019-05-28 DIAGNOSIS — Z9989 Dependence on other enabling machines and devices: Secondary | ICD-10-CM | POA: Diagnosis not present

## 2019-05-28 DIAGNOSIS — M7989 Other specified soft tissue disorders: Secondary | ICD-10-CM | POA: Diagnosis present

## 2019-05-28 DIAGNOSIS — I130 Hypertensive heart and chronic kidney disease with heart failure and stage 1 through stage 4 chronic kidney disease, or unspecified chronic kidney disease: Secondary | ICD-10-CM | POA: Diagnosis present

## 2019-05-28 DIAGNOSIS — I5023 Acute on chronic systolic (congestive) heart failure: Secondary | ICD-10-CM | POA: Diagnosis present

## 2019-05-28 DIAGNOSIS — I2510 Atherosclerotic heart disease of native coronary artery without angina pectoris: Secondary | ICD-10-CM | POA: Diagnosis present

## 2019-05-28 DIAGNOSIS — I10 Essential (primary) hypertension: Secondary | ICD-10-CM | POA: Diagnosis not present

## 2019-05-28 DIAGNOSIS — R079 Chest pain, unspecified: Secondary | ICD-10-CM | POA: Diagnosis not present

## 2019-05-28 DIAGNOSIS — I351 Nonrheumatic aortic (valve) insufficiency: Secondary | ICD-10-CM

## 2019-05-28 DIAGNOSIS — J45909 Unspecified asthma, uncomplicated: Secondary | ICD-10-CM | POA: Diagnosis present

## 2019-05-28 DIAGNOSIS — E1122 Type 2 diabetes mellitus with diabetic chronic kidney disease: Secondary | ICD-10-CM | POA: Diagnosis present

## 2019-05-28 DIAGNOSIS — N183 Chronic kidney disease, stage 3 (moderate): Secondary | ICD-10-CM | POA: Diagnosis present

## 2019-05-28 DIAGNOSIS — Z7901 Long term (current) use of anticoagulants: Secondary | ICD-10-CM | POA: Diagnosis not present

## 2019-05-28 DIAGNOSIS — Z952 Presence of prosthetic heart valve: Secondary | ICD-10-CM | POA: Diagnosis not present

## 2019-05-28 DIAGNOSIS — I5043 Acute on chronic combined systolic (congestive) and diastolic (congestive) heart failure: Secondary | ICD-10-CM | POA: Diagnosis not present

## 2019-05-28 DIAGNOSIS — G4733 Obstructive sleep apnea (adult) (pediatric): Secondary | ICD-10-CM | POA: Diagnosis present

## 2019-05-28 DIAGNOSIS — M1990 Unspecified osteoarthritis, unspecified site: Secondary | ICD-10-CM | POA: Diagnosis present

## 2019-05-28 DIAGNOSIS — Z79899 Other long term (current) drug therapy: Secondary | ICD-10-CM | POA: Diagnosis not present

## 2019-05-28 DIAGNOSIS — E785 Hyperlipidemia, unspecified: Secondary | ICD-10-CM | POA: Diagnosis present

## 2019-05-28 DIAGNOSIS — I50814 Right heart failure due to left heart failure: Secondary | ICD-10-CM | POA: Diagnosis not present

## 2019-05-28 DIAGNOSIS — Z20828 Contact with and (suspected) exposure to other viral communicable diseases: Secondary | ICD-10-CM | POA: Diagnosis present

## 2019-05-28 DIAGNOSIS — I428 Other cardiomyopathies: Secondary | ICD-10-CM | POA: Diagnosis present

## 2019-05-28 DIAGNOSIS — L97529 Non-pressure chronic ulcer of other part of left foot with unspecified severity: Secondary | ICD-10-CM | POA: Diagnosis present

## 2019-05-28 DIAGNOSIS — I4821 Permanent atrial fibrillation: Secondary | ICD-10-CM | POA: Diagnosis present

## 2019-05-28 DIAGNOSIS — I248 Other forms of acute ischemic heart disease: Secondary | ICD-10-CM | POA: Diagnosis present

## 2019-05-28 DIAGNOSIS — R7989 Other specified abnormal findings of blood chemistry: Secondary | ICD-10-CM | POA: Diagnosis not present

## 2019-05-28 DIAGNOSIS — Z7951 Long term (current) use of inhaled steroids: Secondary | ICD-10-CM | POA: Diagnosis not present

## 2019-05-28 DIAGNOSIS — I340 Nonrheumatic mitral (valve) insufficiency: Secondary | ICD-10-CM | POA: Diagnosis not present

## 2019-05-28 DIAGNOSIS — I252 Old myocardial infarction: Secondary | ICD-10-CM | POA: Diagnosis not present

## 2019-05-28 DIAGNOSIS — Z9981 Dependence on supplemental oxygen: Secondary | ICD-10-CM | POA: Diagnosis not present

## 2019-05-28 LAB — HEPATIC FUNCTION PANEL
ALT: 28 U/L (ref 0–44)
AST: 37 U/L (ref 15–41)
Albumin: 3.8 g/dL (ref 3.5–5.0)
Alkaline Phosphatase: 27 U/L — ABNORMAL LOW (ref 38–126)
Bilirubin, Direct: 0.4 mg/dL — ABNORMAL HIGH (ref 0.0–0.2)
Indirect Bilirubin: 0.6 mg/dL (ref 0.3–0.9)
Total Bilirubin: 1 mg/dL (ref 0.3–1.2)
Total Protein: 6.9 g/dL (ref 6.5–8.1)

## 2019-05-28 LAB — T4, FREE: Free T4: 1.21 ng/dL — ABNORMAL HIGH (ref 0.61–1.12)

## 2019-05-28 LAB — GLUCOSE, CAPILLARY
Glucose-Capillary: 81 mg/dL (ref 70–99)
Glucose-Capillary: 80 mg/dL (ref 70–99)
Glucose-Capillary: 180 mg/dL — ABNORMAL HIGH (ref 70–99)
Glucose-Capillary: 188 mg/dL — ABNORMAL HIGH (ref 70–99)

## 2019-05-28 LAB — SARS CORONAVIRUS 2 BY RT PCR (HOSPITAL ORDER, PERFORMED IN ~~LOC~~ HOSPITAL LAB): SARS Coronavirus 2: NEGATIVE

## 2019-05-28 LAB — TROPONIN I (HIGH SENSITIVITY)
Troponin I (High Sensitivity): 72 ng/L — ABNORMAL HIGH (ref <18)
Troponin I (High Sensitivity): 70 ng/L — ABNORMAL HIGH (ref <18)
Troponin I (High Sensitivity): 64 ng/L — ABNORMAL HIGH (ref <18)

## 2019-05-28 LAB — PROTIME-INR
INR: 3.3 — ABNORMAL HIGH (ref 0.8–1.2)
Prothrombin Time: 32.7 seconds — ABNORMAL HIGH (ref 11.4–15.2)

## 2019-05-28 LAB — TSH: TSH: 1.184 u[IU]/mL (ref 0.350–4.500)

## 2019-05-28 LAB — BRAIN NATRIURETIC PEPTIDE: B Natriuretic Peptide: 342.8 pg/mL — ABNORMAL HIGH (ref 0.0–100.0)

## 2019-05-28 MED ORDER — WARFARIN SODIUM 2.5 MG PO TABS
12.5000 mg | ORAL_TABLET | Freq: Once | ORAL | Status: AC
  Administered 2019-05-28: 12.5 mg via ORAL
  Filled 2019-05-28: qty 1

## 2019-05-28 MED ORDER — WARFARIN - PHARMACIST DOSING INPATIENT: Freq: Every day | Status: DC

## 2019-05-28 MED ORDER — INSULIN ASPART 100 UNIT/ML ~~LOC~~ SOLN
0.0000 [IU] | Freq: Three times a day (TID) | SUBCUTANEOUS | Status: DC
  Administered 2019-05-28: 2 [IU] via SUBCUTANEOUS
  Administered 2019-05-29 – 2019-05-30 (×2): 1 [IU] via SUBCUTANEOUS
  Administered 2019-05-30: 3 [IU] via SUBCUTANEOUS

## 2019-05-28 MED ORDER — ONDANSETRON HCL 4 MG/2ML IJ SOLN: 4.0000 mg | Freq: Four times a day (QID) | INTRAMUSCULAR | Status: DC | PRN

## 2019-05-28 MED ORDER — METOPROLOL SUCCINATE ER 25 MG PO TB24
37.5000 mg | ORAL_TABLET | Freq: Two times a day (BID) | ORAL | Status: DC
  Administered 2019-05-28 – 2019-05-30 (×5): 37.5 mg via ORAL
  Filled 2019-05-28 (×6): qty 2

## 2019-05-28 MED ORDER — MORPHINE SULFATE (PF) 2 MG/ML IV SOLN: 1.0000 mg | INTRAVENOUS | Status: DC | PRN

## 2019-05-28 MED ORDER — DIGOXIN 125 MCG PO TABS
0.1250 mg | ORAL_TABLET | Freq: Every day | ORAL | Status: DC
  Administered 2019-05-28 – 2019-05-29 (×2): 0.125 mg via ORAL
  Filled 2019-05-28 (×2): qty 1

## 2019-05-28 MED ORDER — ACETAMINOPHEN 325 MG PO TABS: 650.0000 mg | ORAL_TABLET | ORAL | Status: DC | PRN

## 2019-05-28 MED ORDER — DM-GUAIFENESIN ER 30-600 MG PO TB12: 1.0000 | ORAL_TABLET | Freq: Two times a day (BID) | ORAL | Status: DC | PRN

## 2019-05-28 MED ORDER — ALBUTEROL SULFATE (2.5 MG/3ML) 0.083% IN NEBU: 3.0000 mL | INHALATION_SOLUTION | RESPIRATORY_TRACT | Status: DC | PRN

## 2019-05-28 MED ORDER — EZETIMIBE 10 MG PO TABS
10.0000 mg | ORAL_TABLET | Freq: Every day | ORAL | Status: DC
  Administered 2019-05-28 – 2019-05-29 (×2): 10 mg via ORAL
  Filled 2019-05-28 (×2): qty 1

## 2019-05-28 MED ORDER — ICOSAPENT ETHYL 1 G PO CAPS: 2.0000 g | ORAL_CAPSULE | Freq: Two times a day (BID) | ORAL | Status: DC

## 2019-05-28 MED ORDER — OMEGA-3-ACID ETHYL ESTERS 1 G PO CAPS
2.0000 g | ORAL_CAPSULE | Freq: Two times a day (BID) | ORAL | Status: DC
  Administered 2019-05-28 – 2019-05-30 (×4): 2 g via ORAL
  Filled 2019-05-28 (×4): qty 2

## 2019-05-28 MED ORDER — SODIUM CHLORIDE 0.9 % IV SOLN
INTRAVENOUS | Status: DC
  Administered 2019-05-29: 14:00:00 via INTRAVENOUS

## 2019-05-28 MED ORDER — SODIUM CHLORIDE 0.9% FLUSH
3.0000 mL | Freq: Two times a day (BID) | INTRAVENOUS | Status: DC
  Administered 2019-05-28 – 2019-05-29 (×4): 3 mL via INTRAVENOUS

## 2019-05-28 MED ORDER — VITAMIN D 25 MCG (1000 UNIT) PO TABS
1000.0000 [IU] | ORAL_TABLET | Freq: Every day | ORAL | Status: DC
  Administered 2019-05-28 – 2019-05-29 (×2): 1000 [IU] via ORAL
  Filled 2019-05-28 (×2): qty 1

## 2019-05-28 MED ORDER — NITROGLYCERIN 2 % TD OINT
1.0000 [in_us] | TOPICAL_OINTMENT | Freq: Once | TRANSDERMAL | Status: AC
  Administered 2019-05-28: 03:00:00 1 [in_us] via TOPICAL
  Filled 2019-05-28: qty 1

## 2019-05-28 MED ORDER — FUROSEMIDE 10 MG/ML IJ SOLN
40.0000 mg | Freq: Once | INTRAMUSCULAR | Status: AC
  Administered 2019-05-28: 40 mg via INTRAVENOUS
  Filled 2019-05-28: qty 4

## 2019-05-28 MED ORDER — NITROGLYCERIN 0.4 MG SL SUBL: 0.4000 mg | SUBLINGUAL_TABLET | SUBLINGUAL | Status: DC | PRN

## 2019-05-28 MED ORDER — FUROSEMIDE 10 MG/ML IJ SOLN
40.0000 mg | Freq: Two times a day (BID) | INTRAMUSCULAR | Status: DC
  Administered 2019-05-28 – 2019-05-30 (×5): 40 mg via INTRAVENOUS
  Filled 2019-05-28 (×5): qty 4

## 2019-05-28 MED ORDER — ESCITALOPRAM OXALATE 10 MG PO TABS
10.0000 mg | ORAL_TABLET | Freq: Every evening | ORAL | Status: DC
  Administered 2019-05-28 – 2019-05-29 (×2): 10 mg via ORAL
  Filled 2019-05-28 (×2): qty 1

## 2019-05-28 MED ORDER — FENOFIBRATE 160 MG PO TABS
160.0000 mg | ORAL_TABLET | Freq: Every day | ORAL | Status: DC
  Administered 2019-05-28 – 2019-05-30 (×3): 160 mg via ORAL
  Filled 2019-05-28 (×3): qty 1

## 2019-05-28 MED ORDER — MOMETASONE FURO-FORMOTEROL FUM 200-5 MCG/ACT IN AERO
2.0000 | INHALATION_SPRAY | Freq: Two times a day (BID) | RESPIRATORY_TRACT | Status: DC
  Administered 2019-05-28 – 2019-05-30 (×5): 2 via RESPIRATORY_TRACT
  Filled 2019-05-28 (×2): qty 8.8

## 2019-05-28 MED ORDER — SODIUM CHLORIDE 0.9 % IV SOLN: 250.0000 mL | INTRAVENOUS | Status: DC | PRN

## 2019-05-28 MED ORDER — INSULIN GLARGINE 100 UNIT/ML ~~LOC~~ SOLN
30.0000 [IU] | Freq: Two times a day (BID) | SUBCUTANEOUS | Status: DC
  Administered 2019-05-28 – 2019-05-30 (×4): 30 [IU] via SUBCUTANEOUS
  Filled 2019-05-28 (×6): qty 0.3

## 2019-05-28 MED ORDER — SODIUM CHLORIDE 0.9% FLUSH: 3.0000 mL | INTRAVENOUS | Status: DC | PRN

## 2019-05-28 MED ORDER — OMEGA-3-ACID ETHYL ESTERS 1 G PO CAPS
1.0000 g | ORAL_CAPSULE | Freq: Every day | ORAL | Status: DC
  Administered 2019-05-28: 09:00:00 1 g via ORAL
  Filled 2019-05-28: qty 1

## 2019-05-28 MED ORDER — VITAMIN C 500 MG PO TABS
1000.0000 mg | ORAL_TABLET | Freq: Every day | ORAL | Status: DC
  Administered 2019-05-28 – 2019-05-29 (×2): 1000 mg via ORAL
  Filled 2019-05-28 (×2): qty 2

## 2019-05-28 MED ORDER — ADULT MULTIVITAMIN W/MINERALS CH
1.0000 | ORAL_TABLET | Freq: Every day | ORAL | Status: DC
  Administered 2019-05-28 – 2019-05-30 (×3): 1 via ORAL
  Filled 2019-05-28 (×3): qty 1

## 2019-05-28 NOTE — Progress Notes (Signed)
ANTICOAGULATION CONSULT NOTE - Follow-Up Consult  Pharmacy Consult for Coumadin Indication: AVR/Afib  Allergies  Allergen Reactions  . Iohexol Anaphylaxis  . Niacin And Related     Flushing with immediate realese  . Penicillins Other (See Comments)    Unknown.[name]  Did it involve swelling of the face/tongue/throat, SOB, or low BP? Unknown Did it involve sudden or severe rash/hives, skin peeling, or any reaction on the inside of your mouth or nose? Unknown Did you need to seek medical attention at a hospital or doctor's office? Unknown When did it last happen?Childhood If all above answers are "NO", may proceed with cephalosporin use.    Vital Signs: Temp: 97.6 F (36.4 C) (06/25 0526) Temp Source: Oral (06/25 0526) BP: 150/79 (06/25 0526) Pulse Rate: 80 (06/25 0526)  Labs: Recent Labs    05/27/19 2256 05/28/19 0617  HGB 13.1  --   HCT 42.2  --   PLT 204  --   LABPROT  --  32.7*  INR  --  3.3*  CREATININE 1.68*  --     Estimated Creatinine Clearance: 53 mL/min (A) (by C-G formula based on SCr of 1.68 mg/dL (H)).   Medical History: Past Medical History:  Diagnosis Date  . Anxiety   . Aortic stenosis   . Arthritis   . Asthma   . Asymptomatic LV dysfunction, EF 45%, normal coronary arteries on cardiac cath 09/18/12 09/30/2012  . CHF (congestive heart failure) ([place])   . Chronic anticoagulation, on Xarelto prior to admit 09/30/2012  . DM (diabetes mellitus) ([place]) 09/30/2012  . Hepatitis 2003  . Hypertension   . Myocardial infarction ([place])   . Permanent atrial fibrillation, since 1994 09/30/2012   stress test 02/08/12- normal study, no significant ischemia  . S/P AVR (aortic valve replacement), 09/30/2012   a. s/p mechcanical AVR in 09/2012 (on Coumadin)  . Sleep apnea    uses CPAP     Assessment: 64 yoM admitted with SOB. Pt on warfarin PTA for AFib and mechanical AVR, pharmacy to resume while admitted. INR therapeutic at 3.3, last  dose of warfarin unknown.   **Home warfarin dose = 12.5mg  Tues/Thurs/Sat, 10mg  Mon/Wed/Fri/Sat.  Goal of Therapy:  INR 2.5-3.5   Plan:  -Warfarin 12.5mg  PO x1 tonight -Daily protime   Arrie Senate, PharmD, BCPS Clinical Pharmacist [phone] Please check AMION for all [place] numbers 05/28/2019

## 2019-05-28 NOTE — Progress Notes (Signed)
Pt had 2.23s pause. Pt was asymptomatic. BP stable & charted. Both the hospitalist as well as cardiologist notified. Will continue to monitor the pt. Hoover Brunette, RN

## 2019-05-28 NOTE — Consult Note (Signed)
[place] Nurse wound consult note Patient receiving care in [place]. At the current time, the FYIS box contains the following statement:  "POSSIBLE NOVEL CORONAVIRUS (2019-NCOV)". Reason for Consult: left great toe ulcer Wound type: neuropathic, DFU, present for approximately a month per patient report Measurement: 1.3 cm x 2.4 cm Wound bed: yellow Drainage (amount, consistency, odor) none Periwound: heavy callus Dressing procedure/placement/frequency: Cleanse wound on bottom of left great toe with saline. Pat dry. Place a small piece of Aquace Ag Kellie Simmering [phone]) over the wound bed. Then place a rectangular foam dressing over it.  Change every other day. I have encouraged the patient to seek care of the toe by a podiatrist of his choice after discharge. Monitor the wound area(s) for worsening of condition such as: Signs/symptoms of infection,  Increase in size,  Development of or worsening of odor, Development of pain, or increased pain at the affected locations.  Notify the medical team if any of these develop.  Thank you for the consult.  Discussed plan of care with the patient.  [place] nurse will not follow at this time.  Please re-consult the [place] team if needed.  Val Riles, RN, MSN, CWOCN, CNS-BC, pager [phone]

## 2019-05-28 NOTE — Consult Note (Signed)
At the current time, the FYIS box contains the following statement:  "POSSIBLE NOVEL CORONAVIRUS (2019-NCOV)". I have placed a request via Secure Chat to Dr. Jabier Mutton  requesting photos of the wound areas of concern to be placed in the EMR.   Val Riles, RN, MSN, CWOCN, CNS-BC, pager [phone]

## 2019-05-28 NOTE — Progress Notes (Signed)
ANTICOAGULATION CONSULT NOTE - Initial Consult  Pharmacy Consult for Coumadin Indication: AVR/Afib  Allergies  Allergen Reactions  . Iohexol Anaphylaxis  . Niacin And Related     Flushing with immediate realese  . Penicillins Other (See Comments)    Unknown.[name]  Did it involve swelling of the face/tongue/throat, SOB, or low BP? Unknown Did it involve sudden or severe rash/hives, skin peeling, or any reaction on the inside of your mouth or nose? Unknown Did you need to seek medical attention at a hospital or doctor's office? Unknown When did it last happen?Childhood If all above answers are "NO", may proceed with cephalosporin use.    Vital Signs: Temp: 98.1 F (36.7 C) (06/24 2256) Temp Source: Oral (06/24 2256) BP: 127/59 (06/25 0345) Pulse Rate: 54 (06/25 0345)  Labs: Recent Labs    05/27/19 2256  HGB 13.1  HCT 42.2  PLT 204  CREATININE 1.68*    Estimated Creatinine Clearance: 52.8 mL/min (A) (by C-G formula based on SCr of 1.68 mg/dL (H)).   Medical History: Past Medical History:  Diagnosis Date  . Anxiety   . Aortic stenosis   . Arthritis   . Asthma   . Asymptomatic LV dysfunction, EF 45%, normal coronary arteries on cardiac cath 09/18/12 09/30/2012  . CHF (congestive heart failure) ([place])   . Chronic anticoagulation, on Xarelto prior to admit 09/30/2012  . DM (diabetes mellitus) ([place]) 09/30/2012  . Hepatitis 2003  . Hypertension   . Myocardial infarction ([place])   . Permanent atrial fibrillation, since 1994 09/30/2012   stress test 02/08/12- normal study, no significant ischemia  . S/P AVR (aortic valve replacement), 09/30/2012   a. s/p mechcanical AVR in 09/2012 (on Coumadin)  . Sleep apnea    uses CPAP     Assessment: 68yo male with recent admission now c/o SOB/CP, admitted for acute on chronic heart failure, to continue Coumadin for Afib during admission.  Pt was to be on Bactrim until 6/19 for foot wound, which can affect  INR.  Usual home warfarin dose = 12.5mg  Tues/Thurs/Sat, 10mg  Mon/Wed/Fri/Sat.  Goal of Therapy:  INR 2.5-3.5   Plan:  Will await INR results prior to dosing today's Coumadin and monitor daily INR for dose adjustments.  Wynona Neat, PharmD, BCPS  05/28/2019,5:21 AM

## 2019-05-28 NOTE — Consult Note (Addendum)
Cardiology Consultation:   Patient ID: Joseph Hoffman MRN: [id number]; DOB: 1951-02-02  Admit date: 05/27/2019 Date of Consult: 05/28/2019  Primary Care Provider: Orpah Melter, MD Primary Cardiologist: Shelva Majestic, MD  Primary Electrophysiologist:  None    Patient Profile:   Joseph Hoffman is a 69 y.o. male with a hx of permanent Afib, DM2, HTN, OSA on CPAP, AVR in 9767, chronic systolic HF, CKD stage III and hepatitis  who is being seen today for the evaluation of CHF at the request of Dr. Earnest Conroy.  History of Present Illness:   Joseph Hoffman has a history of afib going back to 63. He also has history of progressive aortic valve stenosis s/p St Jude mechanical Aortic valve replacement in 2013 by [name], on coumadin. He had some transient CHF issues post op but resolved with lasix. Echo in 09/2012 following surgery showed an EF 55-60% with well seated valve.   He retired back in Dec 2016. Seen for preop clearance in July 2018 for a colonoscopy. Was hospitalized back in 08/2017 at [facility] in [place] with acute on chronic HF which resolved with IV lasix. Troponin was mildly increased in the setting of acute CHF. Echo then showed EF of 50-55% with mitral valve leaflets were calcified with 2+ MR.   He was seen in the office in 2018 and started on Toprol XL for better rate control. He was also felt to be a good candidate for Jardiance given his recent HF admission and started at 10mg  daily. Follow up echo in May 2019 showed mild LVH with EF of 50-55% without rWMA. Mechnical valve was well seated with mean aortic gradient 8mmHg with peak gradient of 35mmHg with mild AR, moderate to severe biatrial enlargement.   He has had 2 recent hospitalizations for CHF 04/09/2019 and 05/12/19-05/15/19. Earlier this month the patient presented with 1 week of increased shortness of breath. BNP 297. Echo from 04/10/2019 has shown EF 45-50%. He was diuresed with IV lasix. Creatinine had begun to trend up so  lasix stopped. His home lasix was resumed at discharge. A nuclear stress test was done on 05/15/19 that showed no reversible ischemia, fixed defect related to prior infarct, global hypokinesis and EF 31%. The study was considered high risk based on low EF.   Joseph Hoffman presented to the ED last night with complaints of leg swelling, shortness of breath, orthopnea and intermittent chest pain. He is on home O2. He tells me that last night he was unable to lay down to go to sleep. He took 2 additional doses of lasix, total of 120 mg, yesterday with no improvement in her urinary output. He had some left sided chest pressure on presentation. Nitropatch was applied and he says that made his breathing worse. Once it was removed his breathing improved. He had also received IV lasix.   He has 1+ edema and mild redness of the left leg. Not much edema in the right leg. Home wt has been stable 254-256 lbs.   CXR showed cardiomegaly with small right sided pleural effusion with is increased from prior study.  BNP 342 HS troponin  63, 72, 70, 64.  COVID 19 test negative.  SCr 1.68  Heart Pathway Score:     Past Medical History:  Diagnosis Date  . Anxiety   . Aortic stenosis   . Arthritis   . Asthma   . Asymptomatic LV dysfunction, EF 45%, normal coronary arteries on cardiac cath 09/18/12 09/30/2012  . CHF (congestive heart  failure) ([place])   . Chronic anticoagulation, on Xarelto prior to admit 09/30/2012  . DM (diabetes mellitus) ([place]) 09/30/2012  . Hepatitis 2003  . Hypertension   . Myocardial infarction ([place])   . Permanent atrial fibrillation, since 1994 09/30/2012   stress test 02/08/12- normal study, no significant ischemia  . S/P AVR (aortic valve replacement), 09/30/2012   a. s/p mechcanical AVR in 09/2012 (on Coumadin)  . Sleep apnea    uses CPAP    Past Surgical History:  Procedure Laterality Date  . ABDOMINAL ANGIOGRAM  09/18/2012   Procedure: ABDOMINAL ANGIOGRAM;  Surgeon: Troy Sine,  MD;  Location: [facility] CATH LAB;  Service: Cardiovascular;;  . AORTIC VALVE REPLACEMENT  09/29/2012   Procedure: AORTIC VALVE REPLACEMENT (AVR);  Surgeon: Gaye Pollack, MD;  Location: [place];  Service: Open Heart Surgery;  Laterality: N/A;  . ARCH AORTOGRAM  09/18/2012   Procedure: ARCH AORTOGRAM;  Surgeon: Troy Sine, MD;  Location: [facility] C[facility] LAB;  Service: Cardiovascular;;  . CARDIAC CATHETERIZATION  09/18/12   severe calcific aortic stenosis, peak gradient 35mm, mean gradient 68mm, EF 45%  . CARDIAC VALVE REPLACEMENT     AVR 09-29-12  . CHOLECYSTECTOMY    . FRACTURE SURGERY    . LEFT AND RIGHT HEART CATHETERIZATION WITH CORONARY ANGIOGRAM N/A 09/18/2012   Procedure: LEFT AND RIGHT HEART CATHETERIZATION WITH CORONARY ANGIOGRAM;  Surgeon: Troy Sine, MD;  Location: [facility] CATH LAB;  Service: Cardiovascular;  Laterality: N/A;     Home Medications:  Prior to Admission medications   Medication Sig Start Date End Date Taking? Authorizing Provider  acetaminophen (TYLENOL) 325 MG tablet Take 2 tablets (650 mg total) by mouth every 4 (four) hours as needed for pain or fever. 10/16/12   Erlene Quan, PA-C  albuterol (PROVENTIL HFA;VENTOLIN HFA) 108 (90 BASE) MCG/ACT inhaler Inhale 2 puffs into the lungs every 6 (six) hours as needed. For shortness of breath    [provider]  Ascorbic Acid (VITAMIN C) 1000 MG tablet Take 1,000 mg by mouth at bedtime.     [provider]  B-D ULTRAFINE III SHORT PEN 31G X 8 MM MISC  10/04/13   [provider]  cholecalciferol (VITAMIN D) 1000 UNITS tablet Take 1,000 Units by mouth at bedtime.     [provider]  digoxin (LANOXIN) 0.125 MG tablet TAKE 1 TABLET EVERY DAY Patient taking differently: Take 0.125 mg by mouth at bedtime.  01/21/19   Troy Sine, MD  EASY TOUCH LANCETS 30G/TWIST [place]  11/05/13   [provider]  empagliflozin (JARDIANCE) 25 MG TABS tablet Take 25 mg by mouth daily. 04/20/19   Troy Sine,  MD  escitalopram (LEXAPRO) 10 MG tablet Take 10 mg by mouth every evening.     [provider]  ezetimibe (ZETIA) 10 MG tablet Take 1 tablet (10 mg total) by mouth daily. Patient taking differently: Take 10 mg by mouth at bedtime. Refilled and taking as prescribed 02/09/19 05/12/19  Almyra Deforest, PA  fenofibrate 160 MG tablet Take 1 tablet (160 mg total) by mouth daily. 05/16/19   Black, Lezlie Octave, NP  Fluticasone-Salmeterol (ADVAIR) 250-50 MCG/DOSE AEPB Inhale 1 puff into the lungs as needed (shortness of breath). For asthma related symptoms    [provider]  furosemide (LASIX) 40 MG tablet Take 1 tablet (40 mg total) by mouth daily. Can take an additional tablet if weight increases by 2 lbs or more. 04/12/19   Daune Perch,  NP  glimepiride (AMARYL) 2 MG tablet Take 2 mg by mouth daily with breakfast.  05/25/17   [provider]  Glucosamine-Chondroit-Vit C-Mn (GLUCOSAMINE 1500 COMPLEX PO) Take 1,500 mg by mouth at bedtime.     [provider]  Icosapent Ethyl (VASCEPA) 1 g CAPS Take 2 capsules (2 g total) by mouth 2 (two) times daily. 04/20/19   Troy Sine, MD  insulin glargine (LANTUS) 100 UNIT/ML injection Inject 45 Units into the skin 2 (two) times daily.     [provider]  Liraglutide (VICTOZA [place]) Inject 1.8 mg into the skin at bedtime.     [provider]  metFORMIN (GLUCOPHAGE-XR) 500 MG 24 hr tablet Take 2,000 mg by mouth at bedtime.    [provider]  metoprolol succinate (TOPROL XL) 25 MG 24 hr tablet Take 1.5 tablets (37.5 mg total) by mouth 2 (two) times a day. 05/15/19   Black, Lezlie Octave, NP  Multiple Vitamins-Minerals (CENTRUM SPECIALIST HEART PO) Take 1 tablet by mouth daily.     [provider]  omega-3 acid ethyl esters (LOVAZA) 1 g capsule Take 1 g by mouth daily.    [provider]  sulfamethoxazole-trimethoprim (BACTRIM DS) 800-160 MG tablet Take 1 tablet by mouth 2 (two) times daily. Patient not  taking: Reported on 05/22/2019 05/15/19   Radene Gunning, NP  warfarin (COUMADIN) 10 MG tablet Take 10 mg by mouth See admin instructions. Take Sun. Mon. Wed.Ludwig Clarks    [provider]  warfarin (COUMADIN) 5 MG tablet Take as instructed with 10 mg. Patient taking differently: Take 2.5 mg by mouth See admin instructions. Take with 10 mg to get a total of 12.5 for Tues, Thurs and Saturday 12/11/18   Troy Sine, MD    Inpatient Medications: Scheduled Meds: . cholecalciferol  1,000 Units Oral QHS  . digoxin  0.125 mg Oral QHS  . escitalopram  10 mg Oral QPM  . ezetimibe  10 mg Oral QHS  . fenofibrate  160 mg Oral Daily  . furosemide  40 mg Intravenous Q12H  . Icosapent Ethyl  2 g Oral BID  . insulin aspart  0-9 Units Subcutaneous TID WC  . insulin glargine  30 Units Subcutaneous BID  . metoprolol succinate  37.5 mg Oral BID  . mometasone-formoterol  2 puff Inhalation BID  . multivitamin with minerals  1 tablet Oral Daily  . omega-3 acid ethyl esters  1 g Oral Daily  . sodium chloride flush  3 mL Intravenous Q12H  . vitamin C  1,000 mg Oral QHS  . warfarin  12.5 mg Oral ONCE-1800  . Warfarin - Pharmacist Dosing Inpatient   Does not apply q1800   Continuous Infusions: . sodium chloride     PRN Meds: sodium chloride, acetaminophen, albuterol, dextromethorphan-guaiFENesin, morphine injection, nitroGLYCERIN, ondansetron (ZOFRAN) IV, sodium chloride flush  Allergies:    Allergies  Allergen Reactions  . Iohexol Anaphylaxis  . Niacin And Related     Flushing with immediate realese  . Penicillins Other (See Comments)    Unknown.[name]  Did it involve swelling of the face/tongue/throat, SOB, or low BP? Unknown Did it involve sudden or severe rash/hives, skin peeling, or any reaction on the inside of your mouth or nose? Unknown Did you need to seek medical attention at a hospital or doctor's office? Unknown When did it last happen?Childhood If all above  answers are "NO", may proceed with cephalosporin use.    Social History:  Social History   Socioeconomic History  . Marital status: Married    Spouse name: [name]  . Number of children: 3  . Years of education: 15+  . Highest education level: Not on file  Occupational History    Employer: [employer]  . Financial resource strain: Not on file  . Food insecurity    Worry: Not on file    Inability: Not on file  . Transportation needs    Medical: Not on file    Non-medical: Not on file  Tobacco Use  . Smoking status: Former Smoker    Packs/day: 0.25    Years: 10.00    Pack years: 2.50    Types: Cigarettes    Quit date: 09/25/1982    Years since quitting: 36.6  . Smokeless tobacco: Former Network engineer and Sexual Activity  . Alcohol use: Yes    Comment: occ. beer/wine  . Drug use: No  . Sexual activity: Yes    Birth control/protection: None  Lifestyle  . Physical activity    Days per week: Not on file    Minutes per session: Not on file  . Stress: Not on file  Relationships  . Social Herbalist on phone: Not on file    Gets together: Not on file    Attends religious service: Not on file    Active member of club or organization: Not on file    Attends meetings of clubs or organizations: Not on file    Relationship status: Not on file  . Intimate partner violence    Fear of current or ex partner: Not on file    Emotionally abused: Not on file    Physically abused: Not on file    Forced sexual activity: Not on file  Other Topics Concern  . Not on file  Social History Narrative   Patient is married [name]) and lives with his wife and son.   Patient has three children.   Patient is working full-time.   Patient has a college education.   Patient is right handed.   Patient drinks about 2-3 cups of coffee daily.    Family History:    Family History  Problem Relation Age of Onset  . Hypertension Mother   . Diabetes Father   . Heart  attack Brother 24  . Hyperlipidemia Brother 49       stents placed     ROS:  Please see the history of present illness.   All other ROS reviewed and negative.     Physical Exam/Data:   Vitals:   05/28/19 0345 05/28/19 0526 05/28/19 0833 05/28/19 0901  BP: (!) 127/59 (!) 150/79  110/70  Pulse: (!) 54 80 69 73  Resp: 19 17 19    Temp:  97.6 F (36.4 C)    TempSrc:  Oral    SpO2: 97% 96% 99%   Weight:  116 kg    Height:  5\' 11"  (1.803 m)      Intake/Output Summary (Last 24 hours) at 05/28/2019 1203 Last data filed at 05/28/2019 0840 Gross per 24 hour  Intake 0 ml  Output 550 ml  Net -550 ml   Last 3 Weights 05/28/2019 05/15/2019 05/14/2019  Weight (lbs) 255 lb 11.7 oz 253 lb 11.2 oz 253 lb  Weight (kg) 116 kg 115.078 kg 114.76 kg     Body mass index is 35.67 kg/m.  General:  Well nourished, well developed, in no acute distress HEENT:  normal Lymph: no adenopathy Neck: no JVD Endocrine:  No thryomegaly Vascular: No carotid bruits; FA pulses 2+ bilaterally without bruits  Cardiac:  Irregularly irregular rhythm; Crisp mechanical click Lungs:  clear to auscultation bilaterally, no wheezing, rhonchi or rales  Abd: soft, nontender, no hepatomegaly  Ext: left leg 1+ edema Musculoskeletal:  No deformities, BUE and BLE strength normal and equal Skin: warm and dry  Neuro:  CNs 2-12 intact, no focal abnormalities noted Psych:  Normal affect   EKG:  The EKG was personally reviewed and demonstrates:  Atrial fibrillation, 69 bpm, with premature ventricular or aberrantly conducted complexes, Right superior axis deviation, Non-specific intra-ventricular conduction block, non-specific T changes V5-6  Telemetry:  Telemetry was personally reviewed and demonstrates:  Atrial fibrillation 60's-70's, with PVCs  Relevant CV Studies:  Nuclear stress test 05/15/19  IMPRESSION: 1. No reversible ischemia. Fixed defect in the apex and apical inferior wall is likely related to prior infarct.  2. Global hypokinesis. 3. Left ventricular ejection fraction 31% 4. Non invasive risk stratification*: High-based on ejection fraction of less than 35%  Echocardiogram 04/10/2019 IMPRESSIONS   1. The left ventricle has mildly reduced systolic function, with an ejection fraction of 45-50%. The cavity size was normal. There is moderately increased left ventricular wall thickness. Left ventricular diastolic function could not be evaluated  secondary to atrial fibrillation. Elevated left atrial and left ventricular end-diastolic pressures.  2. The right ventricle has mildly reduced systolic function. The cavity was moderately enlarged. There is no increase in right ventricular wall thickness.  3. Left atrial size was severely dilated.  4. Right atrial size was severely dilated.  5. The mitral valve is abnormal. Mild thickening of the mitral valve leaflet. Mild calcification of the mitral valve leaflet. There is mild mitral annular calcification present. Mitral valve regurgitation is mild to moderate by color flow Doppler.  Mild-moderate mitral valve stenosis.  6. The tricuspid valve is grossly normal.  7. Moderate calcification of the aortic valve. Aortic valve regurgitation is mild by color flow Doppler. Moderate stenosis of the aortic valve.  8. The inferior vena cava was dilated in size with <50% respiratory variability.  Echo 01/08/2019 EF 40-45%  Echo 04/03/2018 EF 50-55%  Echo 08/30/2014 EF 60-65%  Echo 11/19/12 EF 55-60%  LHC 09/18/12:  EF 45%, no significant CAD   Laboratory Data:  High Sensitivity Troponin:   Recent Labs  Lab 05/27/19 2256 05/28/19 0114 05/28/19 0617 05/28/19 1046  TROPONINIHS 63* 72* 70* 64*     Cardiac EnzymesNo results for input(s): TROPONINI in the last 168 hours. No results for input(s): TROPIPOC in the last 168 hours.  Chemistry Recent Labs  Lab 05/27/19 2256  NA 136  K 4.8  CL 104  CO2 21*  GLUCOSE 235*  BUN 49*  CREATININE 1.68*  CALCIUM  9.3  GFRNONAA 41*  GFRAA 47*  ANIONGAP 11    Recent Labs  Lab 05/28/19 0617  PROT 6.9  ALBUMIN 3.8  AST 37  ALT 28  ALKPHOS 27*  BILITOT 1.0   Hematology Recent Labs  Lab 05/27/19 2256  WBC 9.2  RBC 4.94  HGB 13.1  HCT 42.2  MCV 85.4  MCH 26.5  MCHC 31.0  RDW 18.2*  PLT 204   BNP Recent Labs  Lab 05/28/19 0617  BNP 342.8*    DDimer No results for input(s): DDIMER in the last 168 hours.   Radiology/Studies:  Dg Chest 2 View  Result Date: 05/27/2019 CLINICAL DATA:  Chest pain EXAM:  CHEST - 2 VIEW COMPARISON:  May 12, 2019 FINDINGS: The heart size is enlarged. The patient is status post prior median sternotomy. There is a small right-sided pleural effusion which has increased in size from prior study. There is a right-sided airspace opacity which is favored to represent compressive atelectasis. There is no acute osseous abnormality. Degenerative changes are noted throughout the thoracic spine. There is no pneumothorax. IMPRESSION: 1. Cardiomegaly with a small right-sided pleural effusion which has increased in size from prior study. 2. Right basilar airspace opacity which is favored to represent atelectasis. Electronically Signed   By: Constance Holster M.D.   On: 05/27/2019 23:19    Assessment and Plan:   Acute on chronic systolic heart failure -This is the 3rd hospitalization in the less than 2 months. Presented with DOE and orthopnea, left leg edema. Was not responsive to extra lasix at home. Pt denies high sodium intake.  -Echo in 04/2019 showed EF 45-50% with elevated end-diastolic pressures.  -LE dopplers negative for DVT on 05/13/2019.  -BNP 342.8, was 297 during prior hospitalization earlier a couple of weeks ago.  -Wt 255 today was 253 at end of last hosp. Home wts have been within 2 lbs.  -Increased small right pleural effusion on CXR -Home management includes digoxin, Toprol XL 37.5 mg BID. No ACE/ARB due to renal function. He is on lasix 40 mg daily with  extra dose as needed for increased fluid.  -Pt has had 2 dose of IV lasix 40 mg. Pt reports much better increase in urine output although only 550 ml documented. -Pt reports improvement in symptoms, still has mild DOE and LLE edema.  -Strict I&O, daily wts, low sodium diet.  -continue diuresing and monitor renal function. Likely ready for discharge in 1-2 days.   Chest pain -Pt had myoview on recent admission with no reversible ischemia, deemed high risk due to reduced EF.  -Pt has mild left sided chest heaviness that resolved with fluid removal, diuresis.  -Pt had mild HS troponin elevation with Delta of 9, less than 20. Most consistent with demand ischemia in setting of CHF exacerbation.  -Pt not a good cath candidate unless significant symptoms due to renal function.   Permanent atrial fibrillation since 1994 -Rate controlled on beta blocker and digoxin.  -CHA2DS2/VAS Stroke Risk Score is 5 (CHF, Vasc dz, HTN, Age, DM). He is on warfarin for AVR and stroke risk reduction related to afib.    CKD stage III -SCr appears to be in range since 09/2018. Creatinine was up to 2.40 during recent hospitalization earlier this month with diuresis.  -Creatinine 1.68 on presentation. -Monitor renal function with diuresis.   S/P AVR -St Jude mechanical valve. Last echo mean gradient 15 and peak 29.5 mmHg. -Goal INR 2.5-3.5. INR 3.3 today. Pharmacy dosing.   OSA on CPAP -per IM: "He apparently had continuous nocturnal pulse ox done which did show hypoxia but he could not tolerate 6-minute walking desat studies.  Hence home O2 is not being covered by Medicare right now and he is paying out-of-pocket.  Will need walking desat studies prior to discharge.  He is also scheduled for repeat sleep study as outpatient."  Hyperlipidemia -Statin intolerant, on Vascepa, Zetia and fenofibrate with significant improvement.  -LDL 74 on 05/13/2019.   DM type 2 on insulin -On Jardiance, Victoza and metformin  -Poorly controlled with A1c 8.7, was 10.1 in 2019.  -Pt reports that suddenly his blood sugar has been better controlled since his hospitalization in  May.       For questions or updates, please contact [place] Please consult [email or web address] for contact info under     Signed, Daune Perch, NP  05/28/2019 12:03 PM   I have examined the patient and reviewed assessment and plan and discussed with patient.  Agree with above as stated.    Recurrent heart failure.  Responds to IV Lasix.  Cr stable.  ? Whether he could have more AI than noted on prior TTE.  Plan for TEE tomerrow.  Risks and benefits explained to the patient.  All questions answered.  WIll check TSH as well to look for other causes of heart failure sx.   AFib- rate controlled. COumadin for anticoagulation.  Swollen left leg.  Doubt DVT given that he has been on Coumadin.   Larae Grooms

## 2019-05-28 NOTE — Progress Notes (Signed)
Pt c/o 5/10 mid sternal non-radiating chest pressure & SOB. Pt's O2 sats in the mid to upper 90s on 3L via [place]. ekg & vs stable & in chart. Per pt he thinks the nitropaste is causing his problem. Nitropaste removed & pt is now cp free. MD notified. Pt also has a wound on his lt great toe. MD notified of that as well to get a possible wound care consult. Per pt he took antibiotics for it & completed them. Pt's lt foot & leg are swollen & red. Will continue to monitor the pt. Hoover Brunette, RN

## 2019-05-28 NOTE — Progress Notes (Signed)
   Pt has been scheduled for TEE to evaluate mechanical aortic valve for tomorrow at 3 pm with Dr. Stanford Breed. He will need to be NPO after midnight. Orders placed.   Daune Perch, AGNP-C [facility] 05/28/2019  2:59 PM Pager: [phone]

## 2019-05-28 NOTE — ED Notes (Signed)
ED TO INPATIENT HANDOFF REPORT  ED Nurse Name and Phone #: [phone]  S Name/Age/Gender Joseph Hoffman 68 y.o. male Room/Bed: 033C/033C  Code Status   Code Status: Prior  Home/SNF/Other Home Patient oriented to: self, place, time and situation Is this baseline? Yes   Triage Complete: Triage complete  Chief Complaint CHF/Chest Pain  Triage Note Pt reports L sided chest pain starting tonight. Pain is intermittent; reports sob; bilateral edema noted. Pt takes diuretic x1 daily, has taken diuretic x3 today with little urinary output. Reports needing a cath but has had elevated liver tests and has been unable to have cath. Dr.Kelly is pts cardiologist, has appt on Monday   Allergies Allergies  Allergen Reactions  . Iohexol Anaphylaxis  . Niacin And Related     Flushing with immediate realese  . Penicillins Other (See Comments)    Unknown.[name]  Did it involve swelling of the face/tongue/throat, SOB, or low BP? Unknown Did it involve sudden or severe rash/hives, skin peeling, or any reaction on the inside of your mouth or nose? Unknown Did you need to seek medical attention at a hospital or doctor's office? Unknown When did it last happen?Childhood If all above answers are "NO", may proceed with cephalosporin use.    Level of Care/Admitting Diagnosis ED Disposition    ED Disposition Condition Comment   Admit  The patient appears reasonably stabilized for admission considering the current resources, flow, and capabilities available in the ED at this time, and I doubt any other [facility] requiring further screening and/or treatment in the ED prior to admission is  present.       B Medical/Surgery History Past Medical History:  Diagnosis Date  . Anxiety   . Aortic stenosis   . Arthritis   . Asthma   . Asymptomatic LV dysfunction, EF 45%, normal coronary arteries on cardiac cath 09/18/12 09/30/2012  . CHF (congestive heart failure) ([place])   . Chronic  anticoagulation, on Xarelto prior to admit 09/30/2012  . DM (diabetes mellitus) ([place]) 09/30/2012  . Hepatitis 2003  . Hypertension   . Myocardial infarction ([place])   . Permanent atrial fibrillation, since 1994 09/30/2012   stress test 02/08/12- normal study, no significant ischemia  . S/P AVR (aortic valve replacement), 09/30/2012   a. s/p mechcanical AVR in 09/2012 (on Coumadin)  . Sleep apnea    uses CPAP   Past Surgical History:  Procedure Laterality Date  . ABDOMINAL ANGIOGRAM  09/18/2012   Procedure: ABDOMINAL ANGIOGRAM;  Surgeon: Troy Sine, MD;  Location: [facility] CATH LAB;  Service: Cardiovascular;;  . AORTIC VALVE REPLACEMENT  09/29/2012   Procedure: AORTIC VALVE REPLACEMENT (AVR);  Surgeon: Gaye Pollack, MD;  Location: [facility];  Service: Open Heart Surgery;  Laterality: N/A;  . ARCH AORTOGRAM  09/18/2012   Procedure: ARCH AORTOGRAM;  Surgeon: Troy Sine, MD;  Location: [facility] CATH LAB;  Service: Cardiovascular;;  . CARDIAC CATHETERIZATION  09/18/12   severe calcific aortic stenosis, peak gradient 53mm, mean gradient 52mm, EF 45%  . CARDIAC VALVE REPLACEMENT     AVR 09-29-12  . CHOLECYSTECTOMY    . FRACTURE SURGERY    . LEFT AND RIGHT HEART CATHETERIZATION WITH CORONARY ANGIOGRAM N/A 09/18/2012   Procedure: LEFT AND RIGHT HEART CATHETERIZATION WITH CORONARY ANGIOGRAM;  Surgeon: Troy Sine, MD;  Location: [facility] CATH LAB;  Service: Cardiovascular;  Laterality: N/A;     A IV Location/Drains/Wounds Patient Lines/Drains/Airways Status   Active Line/Drains/Airways  Name:   Placement date:   Placement time:   Site:   Days:   Peripheral IV 05/28/19 Left Forearm   05/28/19    0256    Forearm   less than 1   Pressure Injury 05/12/19 Toe (Comment  which one) Left   05/12/19    2310     16          Intake/Output Last 24 hours No intake or output data in the 24 hours ending 05/28/19 0358  Labs/Imaging Results for orders placed or performed during the hospital encounter of  05/27/19 (from the past 48 hour(s))  Basic metabolic panel     Status: Abnormal   Collection Time: 05/27/19 10:56 PM  Result Value Ref Range   Sodium 136 135 - 145 mmol/L   Potassium 4.8 3.5 - 5.1 mmol/L   Chloride 104 98 - 111 mmol/L   CO2 21 (L) 22 - 32 mmol/L   Glucose, Bld 235 (H) 70 - 99 mg/dL   BUN 49 (H) 8 - 23 mg/dL   Creatinine, Ser 1.68 (H) 0.61 - 1.24 mg/dL   Calcium 9.3 8.9 - 10.3 mg/dL   GFR calc non Af Amer 41 (L) >60 mL/min   GFR calc Af Amer 47 (L) >60 mL/min   Anion gap 11 5 - 15    Comment: Performed at [facility], [address], [place], [place] [address]  CBC     Status: Abnormal   Collection Time: 05/27/19 10:56 PM  Result Value Ref Range   WBC 9.2 4.0 - 10.5 K/uL   RBC 4.94 4.22 - 5.81 MIL/uL   Hemoglobin 13.1 13.0 - 17.0 g/dL   HCT 42.2 39.0 - 52.0 %   MCV 85.4 80.0 - 100.0 fL   MCH 26.5 26.0 - 34.0 pg   MCHC 31.0 30.0 - 36.0 g/dL   RDW 18.2 (H) 11.5 - 15.5 %   Platelets 204 150 - 400 K/uL   nRBC 0.0 0.0 - 0.2 %    Comment: Performed at [facility], [place] [address] [place], [place] [address]  Troponin I (High Sensitivity)     Status: Abnormal   Collection Time: 05/27/19 10:56 PM  Result Value Ref Range   Troponin I (High Sensitivity) 63 (H) <18 ng/L    Comment: (NOTE) Elevated high sensitivity troponin I (hsTnI) values and significant  changes across serial measurements may suggest ACS but many other  chronic and acute conditions are known to elevate hsTnI results.  Refer to the "Links" section for chest pain algorithms and additional  guidance. Performed at [facility], [place] [address] [place], [place] [address]   Troponin I (High Sensitivity)     Status: Abnormal   Collection Time: 05/28/19  1:14 AM  Result Value Ref Range   Troponin I (High Sensitivity) 72 (H) <18 ng/L    Comment: (NOTE) Elevated high sensitivity troponin I (hsTnI) values and significant  changes across serial measurements may suggest ACS but many other   chronic and acute conditions are known to elevate hsTnI results.  Refer to the "Links" section for chest pain algorithms and additional  guidance. Performed at [facility], [place] [address] [place], [place] [address]    Dg Chest 2 View  Result Date: 05/27/2019 CLINICAL DATA:  Chest pain EXAM: CHEST - 2 VIEW COMPARISON:  May 12, 2019 FINDINGS: The heart size is enlarged. The patient is status post prior median sternotomy. There is a  small right-sided pleural effusion which has increased in size from prior study. There is a right-sided airspace opacity which is favored to represent compressive atelectasis. There is no acute osseous abnormality. Degenerative changes are noted throughout the thoracic spine. There is no pneumothorax. IMPRESSION: 1. Cardiomegaly with a small right-sided pleural effusion which has increased in size from prior study. 2. Right basilar airspace opacity which is favored to represent atelectasis. Electronically Signed   By: Constance Holster M.D.   On: 05/27/2019 23:19    Pending Labs Unresulted Labs (From admission, onward)    Start     Ordered   05/28/19 0348  SARS Coronavirus 2 (CEPHEID - Performed in [place] hospital lab), Hosp Order  (Asymptomatic Patients Labs)  Once,   STAT    Question:  Rule Out  Answer:  Yes   05/28/19 0347          Vitals/Pain Today's Vitals   05/28/19 0258 05/28/19 0315 05/28/19 0330 05/28/19 0345  BP:  (!) 148/54 137/74 (!) 127/59  Pulse:  85 (!) 44 (!) 54  Resp:  19 19 19   Temp:      TempSrc:      SpO2:  97% 97% 97%  PainSc: 6        Isolation Precautions No active isolations  Medications Medications  sodium chloride flush (NS) 0.9 % injection 3 mL (3 mLs Intravenous Given 05/28/19 0257)  nitroGLYCERIN (NITROGLYN) 2 % ointment 1 inch (1 inch Topical Given 05/28/19 0247)  furosemide (LASIX) injection 40 mg (40 mg Intravenous Given 05/28/19 0248)    Mobility walks Low fall risk   Focused Assessments Cardiac  Assessment Handoff:  Cardiac Rhythm: Atrial fibrillation Lab Results  Component Value Date   TROPONINI 0.11 ([place]) 05/13/2019   No results found for: DDIMER Does the Patient currently have chest pain? No     R Recommendations: See Admitting Provider Note  Report given to:   Additional Notes:  Peripheral edema with redness noted to both lower extremities. Sore still noted to left toe.

## 2019-05-28 NOTE — Progress Notes (Signed)
RT placed pt on CPAP dream station for the night on auto titrate max 14 min 4 with 3 Lpm bled into the system. Pt respiratory status stable at this time and tolerating well. RT will continue to monitor.

## 2019-05-28 NOTE — H&P (Addendum)
History and Physical    Joseph Hoffman QDI:[id number] DOB: 1951-08-17 DOA: 05/27/2019  Referring MD/NP/PA:   PCP: Orpah Melter, MD   Patient coming from:  The patient is coming from home.  At baseline, pt is independent for most of ADL.        Chief Complaint: SOB and chest pain  HPI: Joseph Hoffman is a 68 y.o. male with medical history significant of history significant of hypertension, diabetes mellitus, asthma, sCHF with EF 45%, CAD, atrial fibrillation on Coumadin, AS, s/p of AVR with mechanical valve on coumadin, OSA on CPAP, CKD stage III, depression with anxiety, who presents with shortness of breath and CP.  Pt was recently hospitalized from 6/9-6/12 due to CHF exacerbation and elevated troponin. He did have a stress test during his last admission which was high risk. Patient states that he has worsening shortness of breath since yesterday. He reports orthopnea and dyspnea on exertion. Denies cough, fever or chills.  He reports chest pain which is located in the substernal area and also left side of chest, 8 out of 10 in severity initially, currently chest pain-free, dull, nonradiating.  He reports leg swelling, left slightly worse than right. Patient states that he has gained a few pounds in the past several days.  He took total of 120 mg Lasix yesterday without significant help. Patient denies nausea, vomiting, diarrhea, abdominal pain, symptoms of UTI or unilateral weakness. Of note, pt had small left foot lesion in great toe.  Completed course of Bactrim. The lesion is scabbed.  ED Course: pt was found to have pending COVID-19 test, troponin series 3, 72, stable renal function, WBC 9.2, temperature normal, bradycardia, blood pressure 127/59, oxygen sat 97% on 2 L oxygen, chest x-ray showed cardiomegaly and a right basilar atelectasis with a small right-sided pleural effusion.  Patient is admitted to telemetry bed as inpatient. Dr. Claiborne Billings is his cardiologist.  Review of Systems:    General: no fevers, chills, has body weight gain, has fatigue HEENT: no blurry vision, hearing changes or sore throat Respiratory: has dyspnea, coughing, no wheezing CV: has chest pain, no palpitations GI: no nausea, vomiting, abdominal pain, diarrhea, constipation GU: no dysuria, burning on urination, increased urinary frequency, hematuria  Ext: has leg edema Neuro: no unilateral weakness, numbness, or tingling, no vision change or hearing loss Skin: no rash, no skin tear. MSK: No muscle spasm, no deformity, no limitation of range of movement in spin Heme: No easy bruising.  Travel history: No recent long distant travel.  Allergy:  Allergies  Allergen Reactions  . Iohexol Anaphylaxis  . Niacin And Related     Flushing with immediate realese  . Penicillins Other (See Comments)    Unknown.[name]  Did it involve swelling of the face/tongue/throat, SOB, or low BP? Unknown Did it involve sudden or severe rash/hives, skin peeling, or any reaction on the inside of your mouth or nose? Unknown Did you need to seek medical attention at a hospital or doctor's office? Unknown When did it last happen?Childhood If all above answers are "NO", may proceed with cephalosporin use.    Past Medical History:  Diagnosis Date  . Anxiety   . Aortic stenosis   . Arthritis   . Asthma   . Asymptomatic LV dysfunction, EF 45%, normal coronary arteries on cardiac cath 09/18/12 09/30/2012  . CHF (congestive heart failure) ([place])   . Chronic anticoagulation, on Xarelto prior to admit 09/30/2012  . DM (diabetes mellitus) ([place]) 09/30/2012  .  Hepatitis 2003  . Hypertension   . Myocardial infarction ([place])   . Permanent atrial fibrillation, since 1994 09/30/2012   stress test 02/08/12- normal study, no significant ischemia  . S/P AVR (aortic valve replacement), 09/30/2012   a. s/p mechcanical AVR in 09/2012 (on Coumadin)  . Sleep apnea    uses CPAP    Past Surgical History:   Procedure Laterality Date  . ABDOMINAL ANGIOGRAM  09/18/2012   Procedure: ABDOMINAL ANGIOGRAM;  Surgeon: Troy Sine, MD;  Location: [facility] CATH LAB;  Service: Cardiovascular;;  . AORTIC VALVE REPLACEMENT  09/29/2012   Procedure: AORTIC VALVE REPLACEMENT (AVR);  Surgeon: Gaye Pollack, MD;  Location: [place];  Service: Open Heart Surgery;  Laterality: N/A;  . ARCH AORTOGRAM  09/18/2012   Procedure: ARCH AORTOGRAM;  Surgeon: Troy Sine, MD;  Location: [facility] CATH LAB;  Service: Cardiovascular;;  . CARDIAC CATHETERIZATION  09/18/12   severe calcific aortic stenosis, peak gradient 15mm, mean gradient 22mm, EF 45%  . CARDIAC VALVE REPLACEMENT     AVR 09-29-12  . CHOLECYSTECTOMY    . FRACTURE SURGERY    . LEFT AND RIGHT HEART CATHETERIZATION WITH CORONARY ANGIOGRAM N/A 09/18/2012   Procedure: LEFT AND RIGHT HEART CATHETERIZATION WITH CORONARY ANGIOGRAM;  Surgeon: Troy Sine, MD;  Location: [facility] CATH LAB;  Service: Cardiovascular;  Laterality: N/A;    Social History:  reports that he quit smoking about 36 years ago. His smoking use included cigarettes. He has a 2.50 pack-year smoking history. He has quit using smokeless tobacco. He reports current alcohol use. He reports that he does not use drugs.  Family History:  Family History  Problem Relation Age of Onset  . Hypertension Mother   . Diabetes Father   . Heart attack Brother 73  . Hyperlipidemia Brother 88       stents placed     Prior to Admission medications   Medication Sig Start Date End Date Taking? Authorizing Provider  acetaminophen (TYLENOL) 325 MG tablet Take 2 tablets (650 mg total) by mouth every 4 (four) hours as needed for pain or fever. 10/16/12   Erlene Quan, PA-C  albuterol (PROVENTIL HFA;VENTOLIN HFA) 108 (90 BASE) MCG/ACT inhaler Inhale 2 puffs into the lungs every 6 (six) hours as needed. For shortness of breath    [provider]  Ascorbic Acid (VITAMIN C) 1000 MG tablet Take 1,000 mg by mouth at  bedtime.     [provider]  B-D ULTRAFINE III SHORT PEN 31G X 8 MM MISC  10/04/13   [provider]  cholecalciferol (VITAMIN D) 1000 UNITS tablet Take 1,000 Units by mouth at bedtime.     [provider]  digoxin (LANOXIN) 0.125 MG tablet TAKE 1 TABLET EVERY DAY Patient taking differently: Take 0.125 mg by mouth at bedtime.  01/21/19   Troy Sine, MD  EASY TOUCH LANCETS 30G/TWIST [place]  11/05/13   [provider]  empagliflozin (JARDIANCE) 25 MG TABS tablet Take 25 mg by mouth daily. 04/20/19   Troy Sine, MD  escitalopram (LEXAPRO) 10 MG tablet Take 10 mg by mouth every evening.     [provider]  ezetimibe (ZETIA) 10 MG tablet Take 1 tablet (10 mg total) by mouth daily. Patient taking differently: Take 10 mg by mouth at bedtime. Refilled and taking as prescribed 02/09/19 05/12/19  Almyra Deforest, PA  fenofibrate 160 MG tablet Take 1 tablet (160 mg total) by mouth daily. 05/16/19   Black, Santiago Glad  M, NP  Fluticasone-Salmeterol (ADVAIR) 250-50 MCG/DOSE AEPB Inhale 1 puff into the lungs as needed (shortness of breath). For asthma related symptoms    [provider]  furosemide (LASIX) 40 MG tablet Take 1 tablet (40 mg total) by mouth daily. Can take an additional tablet if weight increases by 2 lbs or more. 04/12/19   Daune Perch, NP  glimepiride (AMARYL) 2 MG tablet Take 2 mg by mouth daily with breakfast.  05/25/17   [provider]  Glucosamine-Chondroit-Vit C-Mn (GLUCOSAMINE 1500 COMPLEX PO) Take 1,500 mg by mouth at bedtime.     [provider]  Icosapent Ethyl (VASCEPA) 1 g CAPS Take 2 capsules (2 g total) by mouth 2 (two) times daily. 04/20/19   Troy Sine, MD  insulin glargine (LANTUS) 100 UNIT/ML injection Inject 45 Units into the skin 2 (two) times daily.     [provider]  Liraglutide (VICTOZA Regino Ramirez) Inject 1.8 mg into the skin at bedtime.     [provider]  metFORMIN (GLUCOPHAGE-XR) 500 MG 24 hr  tablet Take 2,000 mg by mouth at bedtime.    [provider]  metoprolol succinate (TOPROL XL) 25 MG 24 hr tablet Take 1.5 tablets (37.5 mg total) by mouth 2 (two) times a day. 05/15/19   Black, Lezlie Octave, NP  Multiple Vitamins-Minerals (CENTRUM SPECIALIST HEART PO) Take 1 tablet by mouth daily.     [provider]  omega-3 acid ethyl esters (LOVAZA) 1 g capsule Take 1 g by mouth daily.    [provider]  sulfamethoxazole-trimethoprim (BACTRIM DS) 800-160 MG tablet Take 1 tablet by mouth 2 (two) times daily. Patient not taking: Reported on 05/22/2019 05/15/19   Radene Gunning, NP  warfarin (COUMADIN) 10 MG tablet Take 10 mg by mouth See admin instructions. Take Sun. Mon. Wed.Ludwig Clarks    [provider]  warfarin (COUMADIN) 5 MG tablet Take as instructed with 10 mg. Patient taking differently: Take 2.5 mg by mouth See admin instructions. Take with 10 mg to get a total of 12.5 for Tues, Thurs and Saturday 12/11/18   Troy Sine, MD    Physical Exam: Vitals:   05/28/19 0230 05/28/19 0315 05/28/19 0330 05/28/19 0345  BP: (!) 135/59 (!) 148/54 137/74 (!) 127/59  Pulse: 75 85 (!) 44 (!) 54  Resp: (!) 22 19 19 19   Temp:      TempSrc:      SpO2: 98% 97% 97% 97%   General: Not in acute distress HEENT:       Eyes: PERRL, EOMI, no scleral icterus.       ENT: No discharge from the ears and nose, no pharynx injection, no tonsillar enlargement.        Neck: postive JVD, no bruit, no mass felt. Heme: No neck lymph node enlargement. Cardiac: S1/S2, irregularly irregular rhythm, No murmurs, No gallops or rubs. Respiratory: No rales, wheezing, rhonchi or rubs. GI: Soft, nondistended, nontender, no rebound pain, no organomegaly, BS present. GU: No hematuria Ext: 1+ pitting leg edema (left leg is worse than the right). 2+DP/PT pulse bilaterally. Musculoskeletal: No joint deformities, No joint redness or warmth, no limitation of ROM in spin. Skin: No rashes.  Neuro: Alert,  oriented X3, cranial nerves II-XII grossly intact, moves all extremities normally.  Psych: Patient is not psychotic, no suicidal or hemocidal ideation.   Labs on Admission: I have personally reviewed following labs and imaging studies  CBC: Recent Labs  Lab 05/27/19 2256  WBC  9.2  HGB 13.1  HCT 42.2  MCV 85.4  PLT 161   Basic Metabolic Panel: Recent Labs  Lab 05/27/19 2256  NA 136  K 4.8  CL 104  CO2 21*  GLUCOSE 235*  BUN 49*  CREATININE 1.68*  CALCIUM 9.3   GFR: Estimated Creatinine Clearance: 52.8 mL/min (A) (by C-G formula based on SCr of 1.68 mg/dL (H)). Liver Function Tests: No results for input(s): AST, ALT, ALKPHOS, BILITOT, PROT, ALBUMIN in the last 168 hours. No results for input(s): LIPASE, AMYLASE in the last 168 hours. No results for input(s): AMMONIA in the last 168 hours. Coagulation Profile: No results for input(s): INR, PROTIME in the last 168 hours. Cardiac Enzymes: No results for input(s): CKTOTAL, CKMB, CKMBINDEX, TROPONINI in the last 168 hours. BNP (last 3 results) No results for input(s): PROBNP in the last 8760 hours. HbA1C: No results for input(s): HGBA1C in the last 72 hours. CBG: No results for input(s): GLUCAP in the last 168 hours. Lipid Profile: No results for input(s): CHOL, HDL, LDLCALC, TRIG, CHOLHDL, LDLDIRECT in the last 72 hours. Thyroid Function Tests: No results for input(s): TSH, T4TOTAL, FREET4, T3FREE, THYROIDAB in the last 72 hours. Anemia Panel: No results for input(s): VITAMINB12, FOLATE, FERRITIN, TIBC, IRON, RETICCTPCT in the last 72 hours. Urine analysis:    Component Value Date/Time   COLORURINE YELLOW 09/25/2012 1506   APPEARANCEUR CLEAR 09/25/2012 1506   LABSPEC 1.045 (H) 09/25/2012 1506   PHURINE 6.0 09/25/2012 1506   GLUCOSEU >1000 (A) 09/25/2012 1506   HGBUR NEGATIVE 09/25/2012 1506   BILIRUBINUR NEGATIVE 09/25/2012 1506   KETONESUR NEGATIVE 09/25/2012 1506   PROTEINUR NEGATIVE 09/25/2012 1506    UROBILINOGEN 0.2 09/25/2012 1506   NITRITE NEGATIVE 09/25/2012 1506   LEUKOCYTESUR NEGATIVE 09/25/2012 1506   Sepsis Labs: @LABRCNTIP (procalcitonin:4,lacticidven:4) )No results found for this or any previous visit (from the past 240 hour(s)).   Radiological Exams on Admission: Dg Chest 2 View  Result Date: 05/27/2019 CLINICAL DATA:  Chest pain EXAM: CHEST - 2 VIEW COMPARISON:  May 12, 2019 FINDINGS: The heart size is enlarged. The patient is status post prior median sternotomy. There is a small right-sided pleural effusion which has increased in size from prior study. There is a right-sided airspace opacity which is favored to represent compressive atelectasis. There is no acute osseous abnormality. Degenerative changes are noted throughout the thoracic spine. There is no pneumothorax. IMPRESSION: 1. Cardiomegaly with a small right-sided pleural effusion which has increased in size from prior study. 2. Right basilar airspace opacity which is favored to represent atelectasis. Electronically Signed   By: Constance Holster M.D.   On: 05/27/2019 23:19     EKG: Independently reviewed.  Not done in ED, will get one.   Assessment/Plan Principal Problem:   Acute on chronic systolic heart failure, re-admitted 10/12/12 Active Problems:   S/P AVR, 09/29/12, [employer]. (discharged 10/05/12)   Permanent atrial fibrillation, since 1994   Sleep apnea, on C-pap   Chronic anticoagulation   Dyslipidemia, goal LDL below 70   Essential hypertension   Type II diabetes mellitus with renal manifestations (HCC)   Chest pain   Elevated troponin   CKD (chronic kidney disease), stage III (HCC)   Acute on chronic systolic (congestive) heart failure (HCC)   Depression   Acute on chronic systolic heart failure: 2D echo on 04/10/2019 showed EF of 45-50%.  pending BNP. Has bilateral leg edema, weight gain, positive JVD, consistent with CHF exacerbation.    -will admit to  tele bed as inpt -will treat with IV  lasix 40 mg bid -Will not repeat 2-D echo (just had one on 04/10/19) -Daily weights and strict I/O's -Low salt diet  Chest pain and Elevated troponin: His chest pain has resolved.  Patient has chronically elevated troponin, baseline 0.12-0.14 recently.  Today his high sensitive troponin is 63-72.  Patient may have demand ischemia secondary to CHF exacerbation.  A1c 8.7 on 05/12/2020, LDL 74 on 05/13/2019. He had stress test during his last admission which was high risk. -trend trop -prn nitroglycerin and morphine -Continue metoprolol, fenofibrate, Zetia -card consult is requested via Epic  S/P AVR, 09/29/12, [employer]. 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 On coumadin. INR pending. -coumadin per pharm  Permanent atrial fibrillation, since 1994:  CHA2DS2-VASc Score is 5, needs oral anticoagulation. Patient is on Coumadin. Heart rate is well controlled. -continue coumadin, metoprolol and digoxin  Sleep apnea: - on C-pap   Dyslipidemia, goal LDL below 70: -[place] and zetia  HTN:  -Continue home medications: Metoprolol -Patient is on IV Lasix -IV hydralazine prn  Type II diabetes mellitus with renal manifestations ([place]): Last A1c 8.7 on 05/13/19, poorly controled. Patient is taking Lantus, Jardiance, Victoza, Amaryl at home -will decrease Lantus dose from 45 to 30 unit twice daily -SSI  CKD (chronic kidney disease), stage III ([place]): Baseline creatinine 1.7-2.0. his creatinine is 1.68 and BUN 49. At baseline -Follow-up renal function. BMP.  Depression: Stable, no suicidal or homicidal ideations. -Continue home medications     Inpatient status:  # Patient requires inpatient status due to high intensity of service, high risk for further deterioration and high frequency of surveillance required.  I certify that at the point of admission it is my clinical judgment that the patient will require inpatient hospital care spanning beyond 2 midnights from the point of admission.  .  This patient has multiple chronic comorbidities including hypertension, diabetes mellitus, asthma, sCHF with EF 45%, CAD, atrial fibrillation on Coumadin, AS, s/p of AVR with mechanical valve on coumadin, OSA on CPAP, CKD stage III,  . Now patient has presenting with chest pain and CHF exacerbation . The worrisome physical exam findings include positive JVD, bilateral leg edema . The initial radiographic and laboratory data are worrisome because of elevated troponin  . current medical needs: please see my assessment and plan . Predictability of an adverse outcome (risk): Patient has multiple comorbidities, now presents with CHF exacerbation and chest pain.  Has elevated troponin. Patient had a stress test during his last admission which was high risk.  Patient will likely need cardiac cath.  Given his multiple comorbidities and complicated presentations, patient is at high risk of deteriorating.  Will need to be treated in hospital for at least 2 days.      DVT ppx: on Coumadin Code Status: Full code Family Communication: None at bed side.    Disposition Plan:  Anticipate discharge back to previous home environment Consults called:  none Admission status:   Inpatient/tele      Date of Service 05/28/2019    [facility]   If 7PM-7AM, please contact night-coverage [email or web address] Password [facility] 05/28/2019, 4:38 AM

## 2019-05-28 NOTE — ED Provider Notes (Signed)
[facility] EMERGENCY DEPARTMENT Provider Note   CSN: [id number] Arrival date & time: 05/27/19  2240     History   Chief Complaint Chief Complaint  Patient presents with  . Chest Pain    HPI Joseph Hoffman is a 68 y.o. male.     HPI  This is a 68 year old male with a history of congestive heart failure, diabetes, hypertension, permanent atrial fibrillation who presents with leg swelling, shortness of breath, and intermittent chest pain.  He feels this is consistent with his heart failure.  He took 2 additional doses of Lasix today with no improvement of his urinary output.  He is currently on 2 L of oxygen which is his baseline at home.  He reports orthopnea and dyspnea on exertion.  When he is exerting himself he does have occasional chest pain.  He is not currently having any chest pain.  Reports leg swelling left slightly worse than right.  Denies any fevers.  Patient with 2 recent admissions for the same.  He has not had a formal cardiac cath since 2013.  He did have a stress test during his last admission which was high risk secondary to his EF.  Dr. Claiborne Billings is his cardiologist.  Past Medical History:  Diagnosis Date  . Anxiety   . Aortic stenosis   . Arthritis   . Asthma   . Asymptomatic LV dysfunction, EF 45%, normal coronary arteries on cardiac cath 09/18/12 09/30/2012  . CHF (congestive heart failure) ([place])   . Chronic anticoagulation, on Xarelto prior to admit 09/30/2012  . DM (diabetes mellitus) ([place]) 09/30/2012  . Hepatitis 2003  . Hypertension   . Myocardial infarction ([place])   . Permanent atrial fibrillation, since 1994 09/30/2012   stress test 02/08/12- normal study, no significant ischemia  . S/P AVR (aortic valve replacement), 09/30/2012   a. s/p mechcanical AVR in 09/2012 (on Coumadin)  . Sleep apnea    uses CPAP    Patient Active Problem List   Diagnosis Date Noted  . Congestive heart failure (CHF) ([place]) 05/13/2019  . H/O heart valve  replacement with mechanical valve   . Type II diabetes mellitus with renal manifestations ([place]) 05/12/2019  . Chest pain 05/12/2019  . Elevated troponin 05/12/2019  . CKD (chronic kidney disease), stage III ([place]) 05/12/2019  . Acute on chronic systolic (congestive) heart failure ([place]) 05/12/2019  . CRI (chronic renal insufficiency), stage 3 (moderate) ([place]) 04/09/2019  . Acute combined systolic and diastolic heart failure ([place]) 04/09/2019  . Essential hypertension 06/03/2017  . Dyslipidemia, goal LDL below 70 04/11/2017  . AS (aortic stenosis) 10/28/2012  . Acute on chronic systolic heart failure, re-admitted 10/12/12 10/12/2012  . Nonischemic cardiomyopathy, EF 40-45% 10/12/2012  . S/P AVR, 09/29/12, [employer]. (discharged 10/05/12) 09/30/2012  . Permanent atrial fibrillation, since 1994 09/30/2012  . Type 2 IDDM 09/30/2012  . Sleep apnea, on C-pap 09/30/2012  . Normal coronary arteries at cath Oct 2013 09/30/2012  . Chronic anticoagulation, on Coumadin after [employer] AVR 09/30/2012  . Severe aortic stenosis 09/25/2012    Past Surgical History:  Procedure Laterality Date  . ABDOMINAL ANGIOGRAM  09/18/2012   Procedure: ABDOMINAL ANGIOGRAM;  Surgeon: Troy Sine, MD;  Location: [facility] CATH LAB;  Service: Cardiovascular;;  . AORTIC VALVE REPLACEMENT  09/29/2012   Procedure: AORTIC VALVE REPLACEMENT (AVR);  Surgeon: Gaye Pollack, MD;  Location: [facility];  Service: Open Heart Surgery;  Laterality: N/A;  . ARCH AORTOGRAM  09/18/2012  Procedure: ARCH AORTOGRAM;  Surgeon: Troy Sine, MD;  Location: [facility] CATH LAB;  Service: Cardiovascular;;  . CARDIAC CATHETERIZATION  09/18/12   severe calcific aortic stenosis, peak gradient 25mm, mean gradient 55mm, EF 45%  . CARDIAC VALVE REPLACEMENT     AVR 09-29-12  . CHOLECYSTECTOMY    . FRACTURE SURGERY    . LEFT AND RIGHT HEART CATHETERIZATION WITH CORONARY ANGIOGRAM N/A 09/18/2012   Procedure: LEFT AND RIGHT HEART CATHETERIZATION WITH CORONARY  ANGIOGRAM;  Surgeon: Troy Sine, MD;  Location: [facility] CATH LAB;  Service: Cardiovascular;  Laterality: N/A;        Home Medications    Prior to Admission medications   Medication Sig Start Date End Date Taking? Authorizing Provider  acetaminophen (TYLENOL) 325 MG tablet Take 2 tablets (650 mg total) by mouth every 4 (four) hours as needed for pain or fever. 10/16/12   Erlene Quan, PA-C  albuterol (PROVENTIL HFA;VENTOLIN HFA) 108 (90 BASE) MCG/ACT inhaler Inhale 2 puffs into the lungs every 6 (six) hours as needed. For shortness of breath    [provider]  Ascorbic Acid (VITAMIN C) 1000 MG tablet Take 1,000 mg by mouth at bedtime.     [provider]  B-D ULTRAFINE III SHORT PEN 31G X 8 MM MISC  10/04/13   [provider]  cholecalciferol (VITAMIN D) 1000 UNITS tablet Take 1,000 Units by mouth at bedtime.     [provider]  digoxin (LANOXIN) 0.125 MG tablet TAKE 1 TABLET EVERY DAY Patient taking differently: Take 0.125 mg by mouth at bedtime.  01/21/19   Troy Sine, MD  EASY TOUCH LANCETS 30G/TWIST [place]  11/05/13   [provider]  empagliflozin (JARDIANCE) 25 MG TABS tablet Take 25 mg by mouth daily. 04/20/19   Troy Sine, MD  escitalopram (LEXAPRO) 10 MG tablet Take 10 mg by mouth every evening.     [provider]  ezetimibe (ZETIA) 10 MG tablet Take 1 tablet (10 mg total) by mouth daily. Patient taking differently: Take 10 mg by mouth at bedtime. Refilled and taking as prescribed 02/09/19 05/12/19  Almyra Deforest, PA  fenofibrate 160 MG tablet Take 1 tablet (160 mg total) by mouth daily. 05/16/19   Black, Lezlie Octave, NP  Fluticasone-Salmeterol (ADVAIR) 250-50 MCG/DOSE AEPB Inhale 1 puff into the lungs as needed (shortness of breath). For asthma related symptoms    [provider]  furosemide (LASIX) 40 MG tablet Take 1 tablet (40 mg total) by mouth daily. Can take an additional tablet if weight increases by 2 lbs or more.  04/12/19   Daune Perch, NP  glimepiride (AMARYL) 2 MG tablet Take 2 mg by mouth daily with breakfast.  05/25/17   [provider]  Glucosamine-Chondroit-Vit C-Mn (GLUCOSAMINE 1500 COMPLEX PO) Take 1,500 mg by mouth at bedtime.     [provider]  Icosapent Ethyl (VASCEPA) 1 g CAPS Take 2 capsules (2 g total) by mouth 2 (two) times daily. 04/20/19   Troy Sine, MD  insulin glargine (LANTUS) 100 UNIT/ML injection Inject 45 Units into the skin 2 (two) times daily.     [provider]  Liraglutide (VICTOZA [place]) Inject 1.8 mg into the skin at bedtime.     [provider]  metFORMIN (GLUCOPHAGE-XR) 500 MG 24 hr tablet Take 2,000 mg by mouth at bedtime.    [provider]  metoprolol succinate (TOPROL XL) 25 MG 24 hr tablet Take 1.5 tablets (37.5 mg total)  by mouth 2 (two) times a day. 05/15/19   Black, Lezlie Octave, NP  Multiple Vitamins-Minerals (CENTRUM SPECIALIST HEART PO) Take 1 tablet by mouth daily.     [provider]  omega-3 acid ethyl esters (LOVAZA) 1 g capsule Take 1 g by mouth daily.    [provider]  sulfamethoxazole-trimethoprim (BACTRIM DS) 800-160 MG tablet Take 1 tablet by mouth 2 (two) times daily. Patient not taking: Reported on 05/22/2019 05/15/19   Radene Gunning, NP  warfarin (COUMADIN) 10 MG tablet Take 10 mg by mouth See admin instructions. Take Sun. Mon. Wed.Ludwig Clarks    [provider]  warfarin (COUMADIN) 5 MG tablet Take as instructed with 10 mg. Patient taking differently: Take 2.5 mg by mouth See admin instructions. Take with 10 mg to get a total of 12.5 for Tues, Thurs and Saturday 12/11/18   Troy Sine, MD    Family History Family History  Problem Relation Age of Onset  . Hypertension Mother   . Diabetes Father   . Heart attack Brother 2  . Hyperlipidemia Brother 69       stents placed    Social History Social History   Tobacco Use  . Smoking status: Former Smoker    Packs/day: 0.25     Years: 10.00    Pack years: 2.50    Types: Cigarettes    Quit date: 09/25/1982    Years since quitting: 36.6  . Smokeless tobacco: Former Network engineer Use Topics  . Alcohol use: Yes    Comment: occ. beer/wine  . Drug use: No     Allergies   Iohexol, Niacin and related, and Penicillins   Review of Systems Review of Systems  Constitutional: Negative for fever.  Respiratory: Positive for shortness of breath. Negative for cough.   Cardiovascular: Positive for chest pain and leg swelling.  Gastrointestinal: Negative for abdominal pain, nausea and vomiting.  Genitourinary: Negative for dysuria.  All other systems reviewed and are negative.    Physical Exam Updated Vital Signs BP (!) 135/59   Pulse 75   Temp 98.1 F (36.7 C) (Oral)   Resp (!) 22   SpO2 98%   Physical Exam Vitals signs and nursing note reviewed.  Constitutional:      Appearance: He is well-developed.  HENT:     Head: Normocephalic and atraumatic.  Eyes:     Pupils: Pupils are equal, round, and reactive to light.  Neck:     Musculoskeletal: Neck supple.     Vascular: JVD present.  Cardiovascular:     Rate and Rhythm: Normal rate and regular rhythm.     Heart sounds: Murmur present.  Pulmonary:     Effort: Pulmonary effort is normal. No respiratory distress.     Breath sounds: Normal breath sounds. No wheezing.     Comments: Nasal cannula in place Abdominal:     General: Bowel sounds are normal.     Palpations: Abdomen is soft.     Tenderness: There is no abdominal tenderness. There is no rebound.  Musculoskeletal:     Right lower leg: Edema present.     Left lower leg: Edema present.     Comments: Left greater than right lower extremity edema 1+ pitting  Skin:    General: Skin is warm and dry.  Neurological:     Mental Status: He is alert and oriented to person, place, and time.  Psychiatric:        Mood and Affect: Mood  normal.      ED Treatments / Results  Labs (all labs ordered  are listed, but only abnormal results are displayed) Labs Reviewed  BASIC METABOLIC PANEL - Abnormal; Notable for the following components:      Result Value   CO2 21 (*)    Glucose, Bld 235 (*)    BUN 49 (*)    Creatinine, Ser 1.68 (*)    GFR calc non Af Amer 41 (*)    GFR calc Af Amer 47 (*)    All other components within normal limits  CBC - Abnormal; Notable for the following components:   RDW 18.2 (*)    All other components within normal limits  TROPONIN I (HIGH SENSITIVITY) - Abnormal; Notable for the following components:   Troponin I (High Sensitivity) 63 (*)    All other components within normal limits  TROPONIN I (HIGH SENSITIVITY) - Abnormal; Notable for the following components:   Troponin I (High Sensitivity) 72 (*)    All other components within normal limits    EKG EKG Interpretation  Date/Time:  Wednesday May 27 2019 22:50:51 EDT Ventricular Rate:  71 PR Interval:    QRS Duration: 110 QT Interval:  426 QTC Calculation: 462 R Axis:   -124 Text Interpretation:  Atrial fibrillation Right ventricular hypertrophy Inferior infarct , age undetermined Anterolateral infarct , age undetermined Abnormal ECG Confirmed by Thayer Jew ([id number]) on 05/28/2019 1:39:22 AM   Radiology Dg Chest 2 View  Result Date: 05/27/2019 CLINICAL DATA:  Chest pain EXAM: CHEST - 2 VIEW COMPARISON:  May 12, 2019 FINDINGS: The heart size is enlarged. The patient is status post prior median sternotomy. There is a small right-sided pleural effusion which has increased in size from prior study. There is a right-sided airspace opacity which is favored to represent compressive atelectasis. There is no acute osseous abnormality. Degenerative changes are noted throughout the thoracic spine. There is no pneumothorax. IMPRESSION: 1. Cardiomegaly with a small right-sided pleural effusion which has increased in size from prior study. 2. Right basilar airspace opacity which is favored to represent  atelectasis. Electronically Signed   By: Constance Holster M.D.   On: 05/27/2019 23:19    Procedures Procedures (including critical care time)  CRITICAL CARE Performed by: Merryl Hacker   Total critical care time: 35 minutes  Critical care time was exclusive of separately billable procedures and treating other patients.  Critical care was necessary to treat or prevent imminent or life-threatening deterioration.  Critical care was time spent personally by me on the following activities: development of treatment plan with patient and/or surrogate as well as nursing, discussions with consultants, evaluation of patient's response to treatment, examination of patient, obtaining history from patient or surrogate, ordering and performing treatments and interventions, ordering and review of laboratory studies, ordering and review of radiographic studies, pulse oximetry and re-evaluation of patient's condition.   Medications Ordered in ED Medications  sodium chloride flush (NS) 0.9 % injection 3 mL (3 mLs Intravenous Given 05/28/19 0257)  nitroGLYCERIN (NITROGLYN) 2 % ointment 1 inch (1 inch Topical Given 05/28/19 0247)  furosemide (LASIX) injection 40 mg (40 mg Intravenous Given 05/28/19 0248)     Initial Impression / Assessment and Plan / ED Course  I have reviewed the triage vital signs and the nursing notes.  Pertinent labs & imaging results that were available during my care of the patient were reviewed by me and considered in my medical decision making (see chart for details).  Patient presents with acute on chronic heart failure symptoms.  He is nontoxic and on his baseline home O2.  He does appear volume overloaded and chest x-ray shows evidence of cardiomegaly and increased pleural effusion.  EKG shows no evidence of acute ischemia.  Troponin is elevated and has a delta of greater than 10.  Known to need an ischemic evaluation and has a heart score greater than 4.  He is  not actively having any chest pain.  He was placed on nitroglycerin paste and was given 40 mg of IV Lasix.  Will admit to the hospital for cardiology evaluation.  Final Clinical Impressions(s) / ED Diagnoses   Final diagnoses:  Acute on chronic combined systolic and diastolic heart failure ([place])  Elevated troponin    ED Discharge Orders    None       Merryl Hacker, MD 05/28/19 [phone]

## 2019-05-28 NOTE — Progress Notes (Addendum)
PROGRESS NOTE    Joseph Hoffman  IDP:[id number]  DOB: 12-Jun-1951  DOA: 05/27/2019 PCP: Orpah Melter, MD  Brief Narrative: 68 year old male with history of hypertension, diabetes, asthma, CKD stage III, chronic systolic CHF with EF 44%, CAD, atrial fibrillation and s/p mechanical aortic heart valve on Coumadin, obstructive sleep apnea on CPAP, depression and anxiety presents with complaints of chest pain and shortness of breath.  Patient was admitted recently to this [facility] for similar complaints and underwent nuclear stress test on June 12 showing moderate fixed defect within the apex and apical segment of the inferior wall but no evidence of reversible ischemia.  Last echo in May showed biatrial enlargement and biventricular reduced systolic function with EF 45 to 50%. Please see H&P from this morning for full details.  Subjective:  Patient complained of chest pain again this morning.  He describes it as chest heaviness, left-sided associated with dyspnea/inability to take a deep breath.  He states he was scheduled to see Dr. Claiborne Billings as outpatient to discuss risks and benefits of cardiac cath.  He does have a history of sleep apnea for which he uses CPAP but was also issued 2 L nasal cannula recently.  Repeat EKG obtained which did not show any acute ST-T changes compared to old EKG except for PVCs.  He states he feels better after Nitropatch removed.Serial high-sensitivity troponins since presentation to the ED have been 63-72-70.  Currently requiring 2 L of nasal cannula to keep sats greater than 95%.  Objective: Vitals:   05/28/19 0345 05/28/19 0526 05/28/19 0833 05/28/19 0901  BP: (!) 127/59 (!) 150/79  110/70  Pulse: (!) 54 80 69 73  Resp: 19 17 19    Temp:  97.6 F (36.4 C)    TempSrc:  Oral    SpO2: 97% 96% 99%   Weight:  116 kg    Height:  5\' 11"  (1.803 m)      Intake/Output Summary (Last 24 hours) at 05/28/2019 0940 Last data filed at 05/28/2019 0427 Gross per 24 hour   Intake -  Output 550 ml  Net -550 ml   Filed Weights   05/28/19 0526  Weight: 116 kg    Physical Examination:  General exam: Appears calm and comfortable  Respiratory system: Clear to auscultation. Respiratory effort normal. Cardiovascular system: S1 & S2 heard, RRR. No JVD, murmurs, rubs, gallops or clicks. 1+pitting pedal edema. Gastrointestinal system: Abdomen is nondistended, soft and nontender. No organomegaly or masses felt. Normal bowel sounds heard. Central nervous system: Alert and oriented. No focal neurological deficits. Extremities: Symmetric 5 x 5 power.Left great toe ulceration/plantar aspect.  Skin: mild redness along bilateral LE (Chronic per patient) Psychiatry: Judgement and insight appear normal. Mood & affect appropriate.     Data Reviewed: I have personally reviewed following labs and imaging studies  CBC: Recent Labs  Lab 05/27/19 2256  WBC 9.2  HGB 13.1  HCT 42.2  MCV 85.4  PLT 315   Basic Metabolic Panel: Recent Labs  Lab 05/27/19 2256  NA 136  K 4.8  CL 104  CO2 21*  GLUCOSE 235*  BUN 49*  CREATININE 1.68*  CALCIUM 9.3   GFR: Estimated Creatinine Clearance: 53 mL/min (A) (by C-G formula based on SCr of 1.68 mg/dL (H)). Liver Function Tests: Recent Labs  Lab 05/28/19 0617  AST 37  ALT 28  ALKPHOS 27*  BILITOT 1.0  PROT 6.9  ALBUMIN 3.8   No results for input(s): LIPASE, AMYLASE in the last 168  hours. No results for input(s): AMMONIA in the last 168 hours. Coagulation Profile: Recent Labs  Lab 05/28/19 0617  INR 3.3*   Cardiac Enzymes: No results for input(s): CKTOTAL, CKMB, CKMBINDEX, TROPONINI in the last 168 hours. BNP (last 3 results) No results for input(s): PROBNP in the last 8760 hours. HbA1C: No results for input(s): HGBA1C in the last 72 hours. CBG: Recent Labs  Lab 05/28/19 0840  GLUCAP 81   Lipid Profile: No results for input(s): CHOL, HDL, LDLCALC, TRIG, CHOLHDL, LDLDIRECT in the last 72 hours.  Thyroid Function Tests: No results for input(s): TSH, T4TOTAL, FREET4, T3FREE, THYROIDAB in the last 72 hours. Anemia Panel: No results for input(s): VITAMINB12, FOLATE, FERRITIN, TIBC, IRON, RETICCTPCT in the last 72 hours. Sepsis Labs: No results for input(s): PROCALCITON, LATICACIDVEN in the last 168 hours.  Recent Results (from the past 240 hour(s))  SARS Coronavirus 2 (CEPHEID - Performed in [place] hospital lab), Hosp Order     Status: None   Collection Time: 05/28/19  3:48 AM   Specimen: Nasopharyngeal Swab  Result Value Ref Range Status   SARS Coronavirus 2 NEGATIVE NEGATIVE Final    Comment: (NOTE) If result is NEGATIVE SARS-CoV-2 target nucleic acids are NOT DETECTED. The SARS-CoV-2 RNA is generally detectable in upper and lower  respiratory specimens during the acute phase of infection. The lowest  concentration of SARS-CoV-2 viral copies this assay can detect is 250  copies / mL. A negative result does not preclude SARS-CoV-2 infection  and should not be used as the sole basis for treatment or other  patient management decisions.  A negative result may occur with  improper specimen collection / handling, submission of specimen other  than nasopharyngeal swab, presence of viral mutation(s) within the  areas targeted by this assay, and inadequate number of viral copies  (<250 copies / mL). A negative result must be combined with clinical  observations, patient history, and epidemiological information. If result is POSITIVE SARS-CoV-2 target nucleic acids are DETECTED. The SARS-CoV-2 RNA is generally detectable in upper and lower  respiratory specimens dur ing the acute phase of infection.  Positive  results are indicative of active infection with SARS-CoV-2.  Clinical  correlation with patient history and other diagnostic information is  necessary to determine patient infection status.  Positive results do  not rule out bacterial infection or co-infection with other  viruses. If result is PRESUMPTIVE POSTIVE SARS-CoV-2 nucleic acids MAY BE PRESENT.   A presumptive positive result was obtained on the submitted specimen  and confirmed on repeat testing.  While 2019 novel coronavirus  (SARS-CoV-2) nucleic acids may be present in the submitted sample  additional confirmatory testing may be necessary for epidemiological  and / or clinical management purposes  to differentiate between  SARS-CoV-2 and other Sarbecovirus currently known to infect humans.  If clinically indicated additional testing with an alternate test  methodology [phone]) is advised. The SARS-CoV-2 RNA is generally  detectable in upper and lower respiratory sp ecimens during the acute  phase of infection. The expected result is Negative. Fact Sheet for Patients:  StrictlyIdeas.no Fact Sheet for Healthcare Providers: BankingDealers.co.za This test is not yet approved or cleared by the [place] FDA and has been authorized for detection and/or diagnosis of SARS-CoV-2 by FDA under an Emergency Use Authorization (EUA).  This EUA will remain in effect (meaning this test can be used) for the duration of the COVID-19 declaration under Section 564(b)(1) of the Act, 21 U.S.C. section 360bbb-3(b)(1), unless  the authorization is terminated or revoked sooner. Performed at [facility], [place] [address] [place], [place] [address]       Radiology Studies: Dg Chest 2 View  Result Date: 05/27/2019 CLINICAL DATA:  Chest pain EXAM: CHEST - 2 VIEW COMPARISON:  May 12, 2019 FINDINGS: The heart size is enlarged. The patient is status post prior median sternotomy. There is a small right-sided pleural effusion which has increased in size from prior study. There is a right-sided airspace opacity which is favored to represent compressive atelectasis. There is no acute osseous abnormality. Degenerative changes are noted throughout the thoracic spine.  There is no pneumothorax. IMPRESSION: 1. Cardiomegaly with a small right-sided pleural effusion which has increased in size from prior study. 2. Right basilar airspace opacity which is favored to represent atelectasis. Electronically Signed   By: Constance Holster M.D.   On: 05/27/2019 23:19        Scheduled Meds: . cholecalciferol  1,000 Units Oral QHS  . digoxin  0.125 mg Oral QHS  . escitalopram  10 mg Oral QPM  . ezetimibe  10 mg Oral QHS  . fenofibrate  160 mg Oral Daily  . furosemide  40 mg Intravenous Q12H  . Icosapent Ethyl  2 g Oral BID  . insulin aspart  0-9 Units Subcutaneous TID WC  . insulin glargine  30 Units Subcutaneous BID  . metoprolol succinate  37.5 mg Oral BID  . mometasone-formoterol  2 puff Inhalation BID  . multivitamin with minerals  1 tablet Oral Daily  . omega-3 acid ethyl esters  1 g Oral Daily  . sodium chloride flush  3 mL Intravenous Q12H  . vitamin C  1,000 mg Oral QHS  . warfarin  12.5 mg Oral ONCE-1800  . Warfarin - Pharmacist Dosing Inpatient   Does not apply q1800   Continuous Infusions: . sodium chloride      Assessment & Plan:    1.  Recurrent chest pain with dyspnea/elevated troponin: Patient still having episodes of chest pain and dyspnea.  Cardiology consultation has been requested and is pending at this time.  Repeat EKG shows PVCs and nonspecific ST-T changes in lateral leads similar to prior EKGs.Will cycle fourth set of cardiac enzyme and await cardiology evaluation.?  Chronic angina?  Vasospastic angina.  Interestingly his pain is relieved after discontinuation of Nitropatch.  Patient had recent negative stress test.  Defer the need for cath to cardiology.  Continue aspirin, statins and beta-blockers.   2.  Acute on chronic mild systolic CHF: Last EF in May 2020 was 45 to 50%.  Continue IV Lasix, daily weights/I's and O's as he is still dyspneic, requiring O2 and has some leg swellings.  3.  Chronic atrial fibrillation: Heart rate  controlled on beta-blockers/digoxin.  CHA2DS2-VASc Scoreis 5, Continue Coumadin for long-term anticoagulation.  4.  Mechanical heart valve, St. Jude's aortic valve: Continue anticoagulation with Coumadin with goal of INR 2.5-3.5.  5.  Sleep apnea: Resume CPAP.  Patient denies using O2 in the past.  He apparently had continuous nocturnal pulse ox done which did show hypoxia but he could not tolerate 6-minute walking desat studies.  Hence home O2 is not being covered by Medicare right now and he is paying out-of-pocket.  Will need walking desat studies prior to discharge.  He is also scheduled for repeat sleep study as outpatient.  6.  CKD stage III: Patient's creatinine has fluctuated between 1.5-2.1 in the setting of diuretics.  Discussed with patient  regarding possible cardiorenal syndrome and need for IV diuresis superseding optimal renal function.  Risks and benefits would need to be evaluated and patient considered for cardiac cath by cardiology.  7.  Diabetes mellitus type 2: Resume reduced dose of Lantus when eating and holding home meds including Jardiance/Victoza and Amaryl for now.  Hemoglobin A1c earlier this month was 8.7.  SSI   8. Hyperlipidemia resume home meds  9.  Toe ulceration/?  Cellulitis: Recently completed antibiotic course.  No evidence of fever or white count.  Might have underlying stasis dermatitis at baseline.  He does have 1+ pitting edema, being diuresed.  Wound care consult for a.m.  DVT prophylaxis: On Coumadin Code Status: Full code Family / Patient Communication: Discussed with patient Disposition Plan: Home when medically stable Patient status: Patient will be upgraded to inpatient status given ongoing chest discomfort, dyspnea, O2 requirement and need for IV diuresis/cardiology evaluation/possible intervention    LOS: 0 days        Guilford Shi, MD [facility] Pager [phone]  If 7PM-7AM, please contact night-coverage [email or web address]  Password [facility] 05/28/2019, 9:40 AM

## 2019-05-29 ENCOUNTER — Inpatient Hospital Stay (HOSPITAL_COMMUNITY): Payer: Medicare Other

## 2019-05-29 ENCOUNTER — Encounter (HOSPITAL_COMMUNITY): Payer: Self-pay | Admitting: Cardiology

## 2019-05-29 ENCOUNTER — Encounter (HOSPITAL_COMMUNITY)
Admission: EM | Disposition: A | Discharge status: Home / Self Care | Payer: Self-pay | Source: Home / Self Care | Attending: Internal Medicine

## 2019-05-29 DIAGNOSIS — Z7901 Long term (current) use of anticoagulants: Secondary | ICD-10-CM

## 2019-05-29 DIAGNOSIS — E1122 Type 2 diabetes mellitus with diabetic chronic kidney disease: Secondary | ICD-10-CM

## 2019-05-29 DIAGNOSIS — R079 Chest pain, unspecified: Secondary | ICD-10-CM

## 2019-05-29 DIAGNOSIS — I351 Nonrheumatic aortic (valve) insufficiency: Secondary | ICD-10-CM

## 2019-05-29 DIAGNOSIS — I340 Nonrheumatic mitral (valve) insufficiency: Secondary | ICD-10-CM

## 2019-05-29 HISTORY — PX: TEE WITHOUT CARDIOVERSION: SHX5443

## 2019-05-29 LAB — GLUCOSE, CAPILLARY
Glucose-Capillary: 147 mg/dL — ABNORMAL HIGH (ref 70–99)
Glucose-Capillary: 122 mg/dL — ABNORMAL HIGH (ref 70–99)
Glucose-Capillary: 75 mg/dL (ref 70–99)
Glucose-Capillary: 199 mg/dL — ABNORMAL HIGH (ref 70–99)

## 2019-05-29 LAB — BASIC METABOLIC PANEL
Anion gap: 12 (ref 5–15)
BUN: 47 mg/dL — ABNORMAL HIGH (ref 8–23)
CO2: 24 mmol/L (ref 22–32)
Calcium: 9.3 mg/dL (ref 8.9–10.3)
Chloride: 101 mmol/L (ref 98–111)
Creatinine, Ser: 1.8 mg/dL — ABNORMAL HIGH (ref 0.61–1.24)
GFR calc Af Amer: 43 mL/min — ABNORMAL LOW (ref >60)
GFR calc non Af Amer: 37 mL/min — ABNORMAL LOW (ref >60)
Glucose, Bld: 162 mg/dL — ABNORMAL HIGH (ref 70–99)
Potassium: 3.8 mmol/L (ref 3.5–5.1)
Sodium: 137 mmol/L (ref 135–145)

## 2019-05-29 LAB — PROTIME-INR
INR: 2.8 — ABNORMAL HIGH (ref 0.8–1.2)
Prothrombin Time: 28.8 seconds — ABNORMAL HIGH (ref 11.4–15.2)

## 2019-05-29 SURGERY — ECHOCARDIOGRAM, TRANSESOPHAGEAL
Anesthesia: Moderate Sedation

## 2019-05-29 MED ORDER — DIPHENHYDRAMINE HCL 50 MG/ML IJ SOLN
INTRAMUSCULAR | Status: AC
  Filled 2019-05-29: qty 1

## 2019-05-29 MED ORDER — BUTAMBEN-TETRACAINE-BENZOCAINE 2-2-14 % EX AERO
INHALATION_SPRAY | CUTANEOUS | Status: DC | PRN
  Administered 2019-05-29: 15:00:00 2 via TOPICAL

## 2019-05-29 MED ORDER — MIDAZOLAM HCL (PF) 10 MG/2ML IJ SOLN
INTRAMUSCULAR | Status: DC | PRN
  Administered 2019-05-29: 2 mg via INTRAVENOUS
  Administered 2019-05-29: 1 mg via INTRAVENOUS

## 2019-05-29 MED ORDER — FENTANYL CITRATE (PF) 100 MCG/2ML IJ SOLN
INTRAMUSCULAR | Status: AC
  Filled 2019-05-29: qty 4

## 2019-05-29 MED ORDER — MIDAZOLAM HCL (PF) 5 MG/ML IJ SOLN
INTRAMUSCULAR | Status: AC
  Filled 2019-05-29: qty 2

## 2019-05-29 MED ORDER — WARFARIN SODIUM 10 MG PO TABS
10.0000 mg | ORAL_TABLET | Freq: Once | ORAL | Status: AC
  Administered 2019-05-29: 18:00:00 10 mg via ORAL
  Filled 2019-05-29: qty 1

## 2019-05-29 MED ORDER — FENTANYL CITRATE (PF) 100 MCG/2ML IJ SOLN
INTRAMUSCULAR | Status: DC | PRN
  Administered 2019-05-29: 25 ug via INTRAVENOUS

## 2019-05-29 NOTE — Interval H&P Note (Signed)
History and Physical Interval Note:  05/29/2019 2:52 PM  Joseph Hoffman  has presented today for surgery, with the diagnosis of MECHANICAL AORTIC VALVE.  The various methods of treatment have been discussed with the patient and family. After consideration of risks, benefits and other options for treatment, the patient has consented to  Procedure(s): TRANSESOPHAGEAL ECHOCARDIOGRAM (TEE) (N/A) as a surgical intervention.  The patient's history has been reviewed, patient examined, no change in status, stable for surgery.  I have reviewed the patient's chart and labs.  Questions were answered to the patient's satisfaction.     Kirk Ruths

## 2019-05-29 NOTE — Progress Notes (Addendum)
Progress Note  Patient Name: Joseph Hoffman Date of Encounter: 05/29/2019  Primary Cardiologist: Shelva Majestic, MD   Subjective   Pt asleep in bed with CPAP on, lying almost flat. Arouses easily and states "I slept like a baby".  He says his breathing is good with CPAP on but he has still been short of breath with getting up and moving around.   Inpatient Medications    Scheduled Meds:  cholecalciferol  1,000 Units Oral QHS   digoxin  0.125 mg Oral QHS   escitalopram  10 mg Oral QPM   ezetimibe  10 mg Oral QHS   fenofibrate  160 mg Oral Daily   furosemide  40 mg Intravenous Q12H   insulin aspart  0-9 Units Subcutaneous TID WC   insulin glargine  30 Units Subcutaneous BID   metoprolol succinate  37.5 mg Oral BID   mometasone-formoterol  2 puff Inhalation BID   multivitamin with minerals  1 tablet Oral Daily   omega-3 acid ethyl esters  2 g Oral BID   sodium chloride flush  3 mL Intravenous Q12H   vitamin C  1,000 mg Oral QHS   Warfarin - Pharmacist Dosing Inpatient   Does not apply q1800   Continuous Infusions:  sodium chloride     sodium chloride     PRN Meds: sodium chloride, acetaminophen, albuterol, dextromethorphan-guaiFENesin, morphine injection, nitroGLYCERIN, ondansetron (ZOFRAN) IV, sodium chloride flush   Vital Signs    Vitals:   05/28/19 2108 05/28/19 2306 05/29/19 0418 05/29/19 0600  BP:   (!) 135/58   Pulse: 69 76 (!) 47   Resp: 20 20 20    Temp:   98 F (36.7 C)   TempSrc:   Oral   SpO2: 97% 98% 94%   Weight:    116.3 kg  Height:        Intake/Output Summary (Last 24 hours) at 05/29/2019 0815 Last data filed at 05/29/2019 0300 Gross per 24 hour  Intake 720 ml  Output 2500 ml  Net -1780 ml   Last 3 Weights 05/29/2019 05/28/2019 05/15/2019  Weight (lbs) 256 lb 6.3 oz 255 lb 11.7 oz 253 lb 11.2 oz  Weight (kg) 116.3 kg 116 kg 115.078 kg      Telemetry    Afib 70's - Personally Reviewed  ECG    No new tracings - Personally  Reviewed  Physical Exam   GEN: No acute distress.   Neck: No JVD Cardiac: Irregularly irregular rhythm, mechanical valve click Respiratory: Clear to auscultation bilaterally. GI: Soft, nontender, non-distended  MS: Left lower leg edema and mild redness. No edema of right leg.  Neuro:  Nonfocal  Psych: Normal affect   Labs    High Sensitivity Troponin:   Recent Labs  Lab 05/27/19 2256 05/28/19 0114 05/28/19 0617 05/28/19 1046  TROPONINIHS 63* 72* 70* 64*      Cardiac EnzymesNo results for input(s): TROPONINI in the last 168 hours. No results for input(s): TROPIPOC in the last 168 hours.   Chemistry Recent Labs  Lab 05/27/19 2256 05/28/19 0617 05/29/19 0403  NA 136  --  137  K 4.8  --  3.8  CL 104  --  101  CO2 21*  --  24  GLUCOSE 235*  --  162*  BUN 49*  --  47*  CREATININE 1.68*  --  1.80*  CALCIUM 9.3  --  9.3  PROT  --  6.9  --   ALBUMIN  --  3.8  --  AST  --  37  --   ALT  --  28  --   ALKPHOS  --  27*  --   BILITOT  --  1.0  --   GFRNONAA 41*  --  37*  GFRAA 47*  --  43*  ANIONGAP 11  --  12     Hematology Recent Labs  Lab 05/27/19 2256  WBC 9.2  RBC 4.94  HGB 13.1  HCT 42.2  MCV 85.4  MCH 26.5  MCHC 31.0  RDW 18.2*  PLT 204    BNP Recent Labs  Lab 05/28/19 0617  BNP 342.8*     DDimer No results for input(s): DDIMER in the last 168 hours.   Radiology    Dg Chest 2 View  Result Date: 05/27/2019 CLINICAL DATA:  Chest pain EXAM: CHEST - 2 VIEW COMPARISON:  May 12, 2019 FINDINGS: The heart size is enlarged. The patient is status post prior median sternotomy. There is a small right-sided pleural effusion which has increased in size from prior study. There is a right-sided airspace opacity which is favored to represent compressive atelectasis. There is no acute osseous abnormality. Degenerative changes are noted throughout the thoracic spine. There is no pneumothorax. IMPRESSION: 1. Cardiomegaly with a small right-sided pleural effusion  which has increased in size from prior study. 2. Right basilar airspace opacity which is favored to represent atelectasis. Electronically Signed   By: Constance Holster M.D.   On: 05/27/2019 23:19    Cardiac Studies   Nuclear stress test 05/15/19 IMPRESSION: 1. No reversible ischemia. Fixed defect in the apex and apical inferior wall is likely related to prior infarct. 2. Global hypokinesis. 3. Left ventricular ejection fraction 31% 4. Non invasive risk stratification*: High-based on ejection fraction of less than 35%  Echocardiogram 04/10/2019 IMPRESSIONS 1. The left ventricle has mildly reduced systolic function, with an ejection fraction of 45-50%. The cavity size was normal. There is moderately increased left ventricular wall thickness. Left ventricular diastolic function could not be evaluated  secondary to atrial fibrillation. Elevated left atrial and left ventricular end-diastolic pressures. 2. The right ventricle has mildly reduced systolic function. The cavity was moderately enlarged. There is no increase in right ventricular wall thickness. 3. Left atrial size was severely dilated. 4. Right atrial size was severely dilated. 5. The mitral valve is abnormal. Mild thickening of the mitral valve leaflet. Mild calcification of the mitral valve leaflet. There is mild mitral annular calcification present. Mitral valve regurgitation is mild to moderate by color flow Doppler.  Mild-moderate mitral valve stenosis. 6. The tricuspid valve is grossly normal. 7. Moderate calcification of the aortic valve. Aortic valve regurgitation is mild by color flow Doppler. Moderate stenosis of the aortic valve. 8. The inferior vena cava was dilated in size with <50% respiratory variability.  Echo 01/08/2019 EF 40-45%  Echo 04/03/2018 EF 50-55%  Echo 08/30/2014 EF 60-65%  Echo 11/19/12 EF 55-60%  LHC 09/18/12:  EF 45%, no significant CAD   Patient Profile     68 y.o. male with a hx of  permanent Afib since 1994, DM2, HTN, OSA on CPAP, mechanical AVR in 2013 on coumadin, chronic systolic HF EF 63-33%, CKD stage III and hepatitiswho is being seen for the evaluation of CHF. This is his 3rd hospitalization for shortness of breath, orthopnea and edema within 2 months. Not responsive to extra lasix doses at home.   Assessment & Plan    Acute on chronic systolic heart failure -Echo in 04/2019  with EF 45-50% with elevated end-diastolic filling pressures.  -BNP on arrival 342 -Pt did not have significant wt gain since last admit earlier this month.  -Home management includes digoxin, Toprol XL 37.5 mg BID. No ACE/ARB due to renal function. He is on lasix 40 mg daily with extra dose as needed for increased fluid.  -He is continued on his home meds. Diuresing with lasix 40 mg IV q 12 hrs with good urine output of 2.5L yesterday. He is net -2.3L since admission. Wt is actually up 1/2 lb today.  -No orthopnea last night, but still has DOE. LLE edema with mild redness. No edema of RLE.  -TSH is in normal range, 1.184. Free T4 up slightly at 1.21.  -We have arranged for a TEE to evaluate mechanical aortic valve considering hie repeat symptoms.  -Continue with current diuresis.   Left lower leg edema and mild redness -Not sure why just the left leg has edema, ?cellulitis. Pt had LE dopplers 05/13/2019 that showed no DVT. Also unlikely to be DVT with chronic coumadin therapy.  -No leukocytosis, tachycardia or fever to suggest sepsis that could go to heart valve.   Chest pain -Seemed to be related to volume overload.  -Pt had mild HS troponin elevation with Delta of 9, less than 20. Most consistent with demand ischemia in setting of CHF exacerbation.  -Pt had myoview on recent admission with no reversible ischemia, deemed high risk due to reduced EF.   Permanent atrial fibrillation sine 1994 -Rate controlled on beta blocker and digoxin. Staff have been concerned about 2 second "pauses". In  general this is considered beat to beat variability with afib. Not usually concerned unless over 3 seconds. Continue current rate control.  -CHA2DS2/VAS Stroke Risk Score is 5 (CHF, Vasc dz, HTN, Age, DM). He is on warfarin for AVR and stroke risk reduction related to afib.   CKD stage III -SCr 1.68 on presentation, today 1.8. K+ 3.8.  -Continue to monitor with diuresis  S/P St. Jude mechanical AVR 2013 -Recent echo does not mention mechanical valve- moderate calcification and moderate stenosis.  -Hgb is in normal range, bilirubin is not significantly elevated,  no indication of hemolysis.  -TEE arranged for today to better evaluate valve.  -Goal INR 2.5-3.5. INR 2.8 today. Pharmacy dosing.   OSA -Using CPAP and also home O2.   Hyperlipidemia -Statin intolerant, on Vascepa, Zetia and fenofibrate with significant improvement.  -LDL 74 on 05/13/2019.   DM type 2 on insulin -On Jardiance, Victoza and metformin -Poorly controlled with A1c 8.7, was 10.1 in 2019.  -Pt reports that suddenly his blood sugar has been better controlled since his hospitalization in May.  -On SSI. CBG's 80-188.      For questions or updates, please contact [place] Please consult [email or web address] for contact info under        Signed, Daune Perch, NP  05/29/2019, 8:15 AM    I have examined the patient and reviewed assessment and plan and discussed with patient.  Agree with above as stated.    Plan for TEE today.  WOuld switch to torsemide at discharge to see if this diuretic works better than oral Lasix.    Cr increasing slowly, yet his DOE has persisted.  Possible d/c tomorrow if his breathing is better.  He is ok with switching to a diuretic that may be better absorbed.  Larae Grooms

## 2019-05-29 NOTE — Interval H&P Note (Signed)
History and Physical Interval Note:  05/29/2019 2:17 PM  Joseph Hoffman  has presented today for surgery, with the diagnosis of MECHANICAL AORTIC VALVE.  The various methods of treatment have been discussed with the patient and family. After consideration of risks, benefits and other options for treatment, the patient has consented to  Procedure(s): TRANSESOPHAGEAL ECHOCARDIOGRAM (TEE) (N/A) as a surgical intervention.  The patient's history has been reviewed, patient examined, no change in status, stable for surgery.  I have reviewed the patient's chart and labs.  Questions were answered to the patient's satisfaction.     Kirk Ruths

## 2019-05-29 NOTE — Progress Notes (Signed)
    Transesophageal Echocardiogram Note  JARAE PANAS [id number] 1951-06-19  Procedure: Transesophageal Echocardiogram Indications: AVR with recurrent CHF  Procedure Details Consent: Obtained Time Out: Verified patient identification, verified procedure, site/side was marked, verified correct patient position, special equipment/implants available, Radiology Safety Procedures followed,  medications/allergies/relevent history reviewed, required imaging and test results available.  Performed  Medications:  During this procedure the patient is administered a total of Versed 3 mg and Fentanyl 25 mcg  to achieve and maintain moderate conscious sedation.  The patient's heart rate, blood pressure, and oxygen saturation are monitored continuously during the procedure. The period of conscious sedation is 30 minutes, of which I was present face-to-face 100% of this time.  Moderate to severe global reduction in LV systolic function; biatrial enlargement; mild RVE with severe RV dysfunction; s/p AVR with mean gradient of 14 mmHg and moderate to severe perivalvular AI; mild to moderate MR.   Complications: No apparent complications Patient did tolerate procedure well.  Kirk Ruths, MD

## 2019-05-29 NOTE — Progress Notes (Signed)
Pt able to place self on CPAP dream station without assistance. Pt on auto titrate max 14 min 4 with 3 LPM bled into the system. Pt respiratory status is stable at this time. RT will continue to monitor.

## 2019-05-29 NOTE — H&P (View-Only) (Signed)
Progress Note  Patient Name: Joseph Hoffman Date of Encounter: 05/29/2019  Primary Cardiologist: Shelva Majestic, MD   Subjective   Pt asleep in bed with CPAP on, lying almost flat. Arouses easily and states "I slept like a baby".  He says his breathing is good with CPAP on but he has still been short of breath with getting up and moving around.   Inpatient Medications    Scheduled Meds:  cholecalciferol  1,000 Units Oral QHS   digoxin  0.125 mg Oral QHS   escitalopram  10 mg Oral QPM   ezetimibe  10 mg Oral QHS   fenofibrate  160 mg Oral Daily   furosemide  40 mg Intravenous Q12H   insulin aspart  0-9 Units Subcutaneous TID WC   insulin glargine  30 Units Subcutaneous BID   metoprolol succinate  37.5 mg Oral BID   mometasone-formoterol  2 puff Inhalation BID   multivitamin with minerals  1 tablet Oral Daily   omega-3 acid ethyl esters  2 g Oral BID   sodium chloride flush  3 mL Intravenous Q12H   vitamin C  1,000 mg Oral QHS   Warfarin - Pharmacist Dosing Inpatient   Does not apply q1800   Continuous Infusions:  sodium chloride     sodium chloride     PRN Meds: sodium chloride, acetaminophen, albuterol, dextromethorphan-guaiFENesin, morphine injection, nitroGLYCERIN, ondansetron (ZOFRAN) IV, sodium chloride flush   Vital Signs    Vitals:   05/28/19 2108 05/28/19 2306 05/29/19 0418 05/29/19 0600  BP:   (!) 135/58   Pulse: 69 76 (!) 47   Resp: 20 20 20    Temp:   98 F (36.7 C)   TempSrc:   Oral   SpO2: 97% 98% 94%   Weight:    116.3 kg  Height:        Intake/Output Summary (Last 24 hours) at 05/29/2019 0815 Last data filed at 05/29/2019 0300 Gross per 24 hour  Intake 720 ml  Output 2500 ml  Net -1780 ml   Last 3 Weights 05/29/2019 05/28/2019 05/15/2019  Weight (lbs) 256 lb 6.3 oz 255 lb 11.7 oz 253 lb 11.2 oz  Weight (kg) 116.3 kg 116 kg 115.078 kg      Telemetry    Afib 70's - Personally Reviewed  ECG    No new tracings - Personally  Reviewed  Physical Exam   GEN: No acute distress.   Neck: No JVD Cardiac: Irregularly irregular rhythm, mechanical valve click Respiratory: Clear to auscultation bilaterally. GI: Soft, nontender, non-distended  MS: Left lower leg edema and mild redness. No edema of right leg.  Neuro:  Nonfocal  Psych: Normal affect   Labs    High Sensitivity Troponin:   Recent Labs  Lab 05/27/19 2256 05/28/19 0114 05/28/19 0617 05/28/19 1046  TROPONINIHS 63* 72* 70* 64*      Cardiac EnzymesNo results for input(s): TROPONINI in the last 168 hours. No results for input(s): TROPIPOC in the last 168 hours.   Chemistry Recent Labs  Lab 05/27/19 2256 05/28/19 0617 05/29/19 0403  NA 136  --  137  K 4.8  --  3.8  CL 104  --  101  CO2 21*  --  24  GLUCOSE 235*  --  162*  BUN 49*  --  47*  CREATININE 1.68*  --  1.80*  CALCIUM 9.3  --  9.3  PROT  --  6.9  --   ALBUMIN  --  3.8  --  AST  --  37  --   ALT  --  28  --   ALKPHOS  --  27*  --   BILITOT  --  1.0  --   GFRNONAA 41*  --  37*  GFRAA 47*  --  43*  ANIONGAP 11  --  12     Hematology Recent Labs  Lab 05/27/19 2256  WBC 9.2  RBC 4.94  HGB 13.1  HCT 42.2  MCV 85.4  MCH 26.5  MCHC 31.0  RDW 18.2*  PLT 204    BNP Recent Labs  Lab 05/28/19 0617  BNP 342.8*     DDimer No results for input(s): DDIMER in the last 168 hours.   Radiology    Dg Chest 2 View  Result Date: 05/27/2019 CLINICAL DATA:  Chest pain EXAM: CHEST - 2 VIEW COMPARISON:  May 12, 2019 FINDINGS: The heart size is enlarged. The patient is status post prior median sternotomy. There is a small right-sided pleural effusion which has increased in size from prior study. There is a right-sided airspace opacity which is favored to represent compressive atelectasis. There is no acute osseous abnormality. Degenerative changes are noted throughout the thoracic spine. There is no pneumothorax. IMPRESSION: 1. Cardiomegaly with a small right-sided pleural effusion  which has increased in size from prior study. 2. Right basilar airspace opacity which is favored to represent atelectasis. Electronically Signed   By: Constance Holster M.D.   On: 05/27/2019 23:19    Cardiac Studies   Nuclear stress test 05/15/19 IMPRESSION: 1. No reversible ischemia. Fixed defect in the apex and apical inferior wall is likely related to prior infarct. 2. Global hypokinesis. 3. Left ventricular ejection fraction 31% 4. Non invasive risk stratification*: High-based on ejection fraction of less than 35%  Echocardiogram 04/10/2019 IMPRESSIONS 1. The left ventricle has mildly reduced systolic function, with an ejection fraction of 45-50%. The cavity size was normal. There is moderately increased left ventricular wall thickness. Left ventricular diastolic function could not be evaluated  secondary to atrial fibrillation. Elevated left atrial and left ventricular end-diastolic pressures. 2. The right ventricle has mildly reduced systolic function. The cavity was moderately enlarged. There is no increase in right ventricular wall thickness. 3. Left atrial size was severely dilated. 4. Right atrial size was severely dilated. 5. The mitral valve is abnormal. Mild thickening of the mitral valve leaflet. Mild calcification of the mitral valve leaflet. There is mild mitral annular calcification present. Mitral valve regurgitation is mild to moderate by color flow Doppler.  Mild-moderate mitral valve stenosis. 6. The tricuspid valve is grossly normal. 7. Moderate calcification of the aortic valve. Aortic valve regurgitation is mild by color flow Doppler. Moderate stenosis of the aortic valve. 8. The inferior vena cava was dilated in size with <50% respiratory variability.  Echo 01/08/2019 EF 40-45%  Echo 04/03/2018 EF 50-55%  Echo 08/30/2014 EF 60-65%  Echo 11/19/12 EF 55-60%  LHC 09/18/12:  EF 45%, no significant CAD   Patient Profile     68 y.o. male with a hx of  permanent Afib since 1994, DM2, HTN, OSA on CPAP, mechanical AVR in 2013 on coumadin, chronic systolic HF EF 30-09%, CKD stage III and hepatitiswho is being seen for the evaluation of CHF. This is his 3rd hospitalization for shortness of breath, orthopnea and edema within 2 months. Not responsive to extra lasix doses at home.   Assessment & Plan    Acute on chronic systolic heart failure -Echo in 04/2019  with EF 45-50% with elevated end-diastolic filling pressures.  -BNP on arrival 342 -Pt did not have significant wt gain since last admit earlier this month.  -Home management includes digoxin, Toprol XL 37.5 mg BID. No ACE/ARB due to renal function. He is on lasix 40 mg daily with extra dose as needed for increased fluid.  -He is continued on his home meds. Diuresing with lasix 40 mg IV q 12 hrs with good urine output of 2.5L yesterday. He is net -2.3L since admission. Wt is actually up 1/2 lb today.  -No orthopnea last night, but still has DOE. LLE edema with mild redness. No edema of RLE.  -TSH is in normal range, 1.184. Free T4 up slightly at 1.21.  -We have arranged for a TEE to evaluate mechanical aortic valve considering hie repeat symptoms.  -Continue with current diuresis.   Left lower leg edema and mild redness -Not sure why just the left leg has edema, ?cellulitis. Pt had LE dopplers 05/13/2019 that showed no DVT. Also unlikely to be DVT with chronic coumadin therapy.  -No leukocytosis, tachycardia or fever to suggest sepsis that could go to heart valve.   Chest pain -Seemed to be related to volume overload.  -Pt had mild HS troponin elevation with Delta of 9, less than 20. Most consistent with demand ischemia in setting of CHF exacerbation.  -Pt had myoview on recent admission with no reversible ischemia, deemed high risk due to reduced EF.   Permanent atrial fibrillation sine 1994 -Rate controlled on beta blocker and digoxin. Staff have been concerned about 2 second "pauses". In  general this is considered beat to beat variability with afib. Not usually concerned unless over 3 seconds. Continue current rate control.  -CHA2DS2/VAS Stroke Risk Score is 5 (CHF, Vasc dz, HTN, Age, DM). He is on warfarin for AVR and stroke risk reduction related to afib.   CKD stage III -SCr 1.68 on presentation, today 1.8. K+ 3.8.  -Continue to monitor with diuresis  S/P St. Jude mechanical AVR 2013 -Recent echo does not mention mechanical valve- moderate calcification and moderate stenosis.  -Hgb is in normal range, bilirubin is not significantly elevated,  no indication of hemolysis.  -TEE arranged for today to better evaluate valve.  -Goal INR 2.5-3.5. INR 2.8 today. Pharmacy dosing.   OSA -Using CPAP and also home O2.   Hyperlipidemia -Statin intolerant, on Vascepa, Zetia and fenofibrate with significant improvement.  -LDL 74 on 05/13/2019.   DM type 2 on insulin -On Jardiance, Victoza and metformin -Poorly controlled with A1c 8.7, was 10.1 in 2019.  -Pt reports that suddenly his blood sugar has been better controlled since his hospitalization in May.  -On SSI. CBG's 80-188.      For questions or updates, please contact [place] Please consult [email or web address] for contact info under        Signed, Daune Perch, NP  05/29/2019, 8:15 AM    I have examined the patient and reviewed assessment and plan and discussed with patient.  Agree with above as stated.    Plan for TEE today.  WOuld switch to torsemide at discharge to see if this diuretic works better than oral Lasix.    Cr increasing slowly, yet his DOE has persisted.  Possible d/c tomorrow if his breathing is better.  He is ok with switching to a diuretic that may be better absorbed.  Larae Grooms

## 2019-05-29 NOTE — Progress Notes (Signed)
  Echocardiogram Echocardiogram Transesophageal has been performed.  Joseph Hoffman L Androw 05/29/2019, 3:37 PM

## 2019-05-29 NOTE — Progress Notes (Signed)
PROGRESS NOTE    Joseph Hoffman  YQM:[id number]  DOB: 1951/05/29  DOA: 05/27/2019 PCP: Orpah Melter, MD  Brief Narrative: 68 year old male with history of hypertension, diabetes, asthma, CKD stage III, chronic systolic CHF with EF 52%, CAD, atrial fibrillation and s/p mechanical aortic heart valve on Coumadin, obstructive sleep apnea on CPAP, depression and anxiety presents with complaints of chest pain and shortness of breath.  Patient was admitted recently to this [facility] for similar complaints and underwent nuclear stress test on June 12 showing moderate fixed defect within the apex and apical segment of the inferior wall but no evidence of reversible ischemia.  Last echo in May showed biatrial enlargement and biventricular reduced systolic function with EF 45 to 50%.He does have a history of sleep apnea for which he uses CPAP but was also issued 2 L nasal cannula recently.   Subjective:  Patient sitting comfortably and having dinner. He denies any c/o chest pain today and underwent TEE by cardiology.    Objective: Vitals:   05/29/19 1509 05/29/19 1513 05/29/19 1521 05/29/19 1541  BP: 129/66 (!) 126/59 (!) 135/58 (!) 129/48  Pulse: 65 (!) 55 62 (!) 50  Resp: 19 (!) 21  14  Temp:   98.8 F (37.1 C)   TempSrc:   Temporal   SpO2: 97% 96% 94% 96%  Weight:      Height:        Intake/Output Summary (Last 24 hours) at 05/29/2019 1659 Last data filed at 05/29/2019 1028 Gross per 24 hour  Intake 360 ml  Output 2300 ml  Net -1940 ml   Filed Weights   05/28/19 0526 05/29/19 0600  Weight: 116 kg 116.3 kg    Physical Examination:  General exam: Appears calm and comfortable  Respiratory system: Clear to auscultation. Respiratory effort normal. Cardiovascular system: S1 & S2 heard, mechanical click, irregular.No JVD, systolic murmurs, rubs, gallops or clicks.  1+pitting pedal edema. Gastrointestinal system: Abdomen is nondistended, soft and nontender. No organomegaly or  masses felt. Normal bowel sounds heard. Central nervous system: Alert and oriented. No focal neurological deficits. Extremities: Symmetric 5 x 5 power.Left great toe ulceration/plantar aspect.  Skin: mild redness along bilateral LE (Chronic per patient)  Psychiatry: Judgement and insight appear normal. Mood & affect appropriate.     Data Reviewed: I have personally reviewed following labs and imaging studies  CBC: Recent Labs  Lab 05/27/19 2256  WBC 9.2  HGB 13.1  HCT 42.2  MCV 85.4  PLT 841   Basic Metabolic Panel: Recent Labs  Lab 05/27/19 2256 05/29/19 0403  NA 136 137  K 4.8 3.8  CL 104 101  CO2 21* 24  GLUCOSE 235* 162*  BUN 49* 47*  CREATININE 1.68* 1.80*  CALCIUM 9.3 9.3   GFR: Estimated Creatinine Clearance: 49.5 mL/min (A) (by C-G formula based on SCr of 1.8 mg/dL (H)). Liver Function Tests: Recent Labs  Lab 05/28/19 0617  AST 37  ALT 28  ALKPHOS 27*  BILITOT 1.0  PROT 6.9  ALBUMIN 3.8   No results for input(s): LIPASE, AMYLASE in the last 168 hours. No results for input(s): AMMONIA in the last 168 hours. Coagulation Profile: Recent Labs  Lab 05/28/19 0617 05/29/19 0403  INR 3.3* 2.8*   Cardiac Enzymes: No results for input(s): CKTOTAL, CKMB, CKMBINDEX, TROPONINI in the last 168 hours. BNP (last 3 results) No results for input(s): PROBNP in the last 8760 hours. HbA1C: No results for input(s): HGBA1C in the last 72 hours. CBG:  Recent Labs  Lab 05/28/19 1647 05/28/19 2118 05/29/19 0727 05/29/19 1140 05/29/19 1624  GLUCAP 180* 188* 147* 122* 75   Lipid Profile: No results for input(s): CHOL, HDL, LDLCALC, TRIG, CHOLHDL, LDLDIRECT in the last 72 hours. Thyroid Function Tests: Recent Labs    05/28/19 1500  TSH 1.184  FREET4 1.21*   Anemia Panel: No results for input(s): VITAMINB12, FOLATE, FERRITIN, TIBC, IRON, RETICCTPCT in the last 72 hours. Sepsis Labs: No results for input(s): PROCALCITON, LATICACIDVEN in the last 168  hours.  Recent Results (from the past 240 hour(s))  SARS Coronavirus 2 (CEPHEID - Performed in [place] hospital lab), Hosp Order     Status: None   Collection Time: 05/28/19  3:48 AM   Specimen: Nasopharyngeal Swab  Result Value Ref Range Status   SARS Coronavirus 2 NEGATIVE NEGATIVE Final    Comment: (NOTE) If result is NEGATIVE SARS-CoV-2 target nucleic acids are NOT DETECTED. The SARS-CoV-2 RNA is generally detectable in upper and lower  respiratory specimens during the acute phase of infection. The lowest  concentration of SARS-CoV-2 viral copies this assay can detect is 250  copies / mL. A negative result does not preclude SARS-CoV-2 infection  and should not be used as the sole basis for treatment or other  patient management decisions.  A negative result may occur with  improper specimen collection / handling, submission of specimen other  than nasopharyngeal swab, presence of viral mutation(s) within the  areas targeted by this assay, and inadequate number of viral copies  (<250 copies / mL). A negative result must be combined with clinical  observations, patient history, and epidemiological information. If result is POSITIVE SARS-CoV-2 target nucleic acids are DETECTED. The SARS-CoV-2 RNA is generally detectable in upper and lower  respiratory specimens dur ing the acute phase of infection.  Positive  results are indicative of active infection with SARS-CoV-2.  Clinical  correlation with patient history and other diagnostic information is  necessary to determine patient infection status.  Positive results do  not rule out bacterial infection or co-infection with other viruses. If result is PRESUMPTIVE POSTIVE SARS-CoV-2 nucleic acids MAY BE PRESENT.   A presumptive positive result was obtained on the submitted specimen  and confirmed on repeat testing.  While 2019 novel coronavirus  (SARS-CoV-2) nucleic acids may be present in the submitted sample  additional  confirmatory testing may be necessary for epidemiological  and / or clinical management purposes  to differentiate between  SARS-CoV-2 and other Sarbecovirus currently known to infect humans.  If clinically indicated additional testing with an alternate test  methodology [phone]) is advised. The SARS-CoV-2 RNA is generally  detectable in upper and lower respiratory sp ecimens during the acute  phase of infection. The expected result is Negative. Fact Sheet for Patients:  StrictlyIdeas.no Fact Sheet for Healthcare Providers: BankingDealers.co.za This test is not yet approved or cleared by the [place] FDA and has been authorized for detection and/or diagnosis of SARS-CoV-2 by FDA under an Emergency Use Authorization (EUA).  This EUA will remain in effect (meaning this test can be used) for the duration of the COVID-19 declaration under Section 564(b)(1) of the Act, 21 U.S.C. section 360bbb-3(b)(1), unless the authorization is terminated or revoked sooner. Performed at [facility], [place] [address], [place], [place] [address]       Radiology Studies: Dg Chest 2 View  Result Date: 05/27/2019 CLINICAL DATA:  Chest pain EXAM: CHEST - 2 VIEW COMPARISON:  May 12, 2019 FINDINGS:  The heart size is enlarged. The patient is status post prior median sternotomy. There is a small right-sided pleural effusion which has increased in size from prior study. There is a right-sided airspace opacity which is favored to represent compressive atelectasis. There is no acute osseous abnormality. Degenerative changes are noted throughout the thoracic spine. There is no pneumothorax. IMPRESSION: 1. Cardiomegaly with a small right-sided pleural effusion which has increased in size from prior study. 2. Right basilar airspace opacity which is favored to represent atelectasis. Electronically Signed   By: Constance Holster M.D.   On: 05/27/2019 23:19         Scheduled Meds:  cholecalciferol  1,000 Units Oral QHS   digoxin  0.125 mg Oral QHS   escitalopram  10 mg Oral QPM   ezetimibe  10 mg Oral QHS   fenofibrate  160 mg Oral Daily   furosemide  40 mg Intravenous Q12H   insulin aspart  0-9 Units Subcutaneous TID WC   insulin glargine  30 Units Subcutaneous BID   metoprolol succinate  37.5 mg Oral BID   mometasone-formoterol  2 puff Inhalation BID   multivitamin with minerals  1 tablet Oral Daily   omega-3 acid ethyl esters  2 g Oral BID   sodium chloride flush  3 mL Intravenous Q12H   vitamin C  1,000 mg Oral QHS   warfarin  10 mg Oral ONCE-1800   Warfarin - Pharmacist Dosing Inpatient   Does not apply q1800   Continuous Infusions:  sodium chloride      Assessment & Plan:    1.  Acute on chronic systolic CHF: Last EF in May 2020 was 45 to 50%.  Continue IV Lasix, daily weights/I's and O's . Taper O2 as tolerated. Underwent TEE today which showed "Moderate to severe global reduction in LV systolic function; biatrial enlargement; mild RVE with severe RV dysfunction; s/p AVR with mean gradient of 14 mmHg and moderate to severe perivalvular AI; mild to moderate MR".  He might have fluid retention and chest pain episodes from valvular heart disease as well.   2. Recurrent chest pain with dyspnea/elevated troponin: Denies chest pain / dyspnea today.  Cardiology consultation and recommendations appreciated.  Patient had recent negative stress test.  Continue aspirin, statins and beta-blockers. Defer management of valvular heart disease to cardiology  3.  Chronic atrial fibrillation: Heart rate controlled on beta-blockers/digoxin.  CHA2DS2-VASc Scoreis 5, Continue Coumadin for long-term anticoagulation.  4.  Mechanical heart valve, St. Jude's aortic valve: Continue anticoagulation with Coumadin with goal of INR 2.5-3.5.  5.  Sleep apnea: Resume CPAP.  Patient denies using O2 in the past.  He apparently had  continuous nocturnal pulse ox done which did show hypoxia but he could not tolerate 6-minute walking desat studies.  Hence home O2 is not being covered by Medicare right now and he is paying out-of-pocket.  Will need walking desat studies prior to discharge.  He is also scheduled for repeat sleep study as outpatient.  6.  CKD stage III: Patient's creatinine has fluctuated between 1.5-2.1 in the setting of diuretics. Today at 1.8.  Discussed with patient regarding possible cardiorenal syndrome and need for IV diuresis superseding optimal renal function.   7.  Diabetes mellitus type 2: Resume reduced dose of Lantus when eating and holding home meds including Jardiance/Victoza and Amaryl for now.  Hemoglobin A1c earlier this month was 8.7.  SSI   8. Hyperlipidemia resume home meds  9.  Toe ulceration/?  Cellulitis:  Recently completed antibiotic course.  No evidence of fever or white count.  Might have underlying stasis dermatitis at baseline.  He does have 1+ pitting edema, being diuresed.  Wound care consult appreciated. He plans to follow up with Podiatry upon discharge  DVT prophylaxis: On Coumadin Code Status: Full code Family / Patient Communication: Discussed with patient Disposition Plan: Home when medically stable     LOS: 1 day        Guilford Shi, MD [facility] Pager [phone]  If 7PM-7AM, please contact night-coverage [email or web address] Password [facility] 05/29/2019, 4:59 PM

## 2019-05-29 NOTE — Progress Notes (Signed)
ANTICOAGULATION CONSULT NOTE - Follow-Up Consult  Pharmacy Consult for Coumadin Indication: AVR/Afib  Allergies  Allergen Reactions  . Iohexol Anaphylaxis  . Niacin And Related     Flushing with immediate realese  . Penicillins Other (See Comments)    Unknown.[name]  Did it involve swelling of the face/tongue/throat, SOB, or low BP? Unknown Did it involve sudden or severe rash/hives, skin peeling, or any reaction on the inside of your mouth or nose? Unknown Did you need to seek medical attention at a hospital or doctor's office? Unknown When did it last happen?Childhood If all above answers are "NO", may proceed with cephalosporin use.    Vital Signs: Temp: 98 F (36.7 C) (06/26 0418) Temp Source: Oral (06/26 0418) BP: 135/58 (06/26 0418) Pulse Rate: 47 (06/26 0418)  Labs: Recent Labs    05/27/19 2256 05/28/19 0617 05/29/19 0403  HGB 13.1  --   --   HCT 42.2  --   --   PLT 204  --   --   LABPROT  --  32.7* 28.8*  INR  --  3.3* 2.8*  CREATININE 1.68*  --  1.80*    Estimated Creatinine Clearance: 49.5 mL/min (A) (by C-G formula based on SCr of 1.8 mg/dL (H)).   Medical History: Past Medical History:  Diagnosis Date  . Anxiety   . Aortic stenosis   . Arthritis   . Asthma   . Asymptomatic LV dysfunction, EF 45%, normal coronary arteries on cardiac cath 09/18/12 09/30/2012  . CHF (congestive heart failure) ([place])   . Chronic anticoagulation, on Xarelto prior to admit 09/30/2012  . DM (diabetes mellitus) ([place]) 09/30/2012  . Hepatitis 2003  . Hypertension   . Myocardial infarction ([place])   . Permanent atrial fibrillation, since 1994 09/30/2012   stress test 02/08/12- normal study, no significant ischemia  . S/P AVR (aortic valve replacement), 09/30/2012   a. s/p mechcanical AVR in 09/2012 (on Coumadin)  . Sleep apnea    uses CPAP     Assessment: 29 yoM admitted with SOB. Pt on warfarin PTA for AFib and mechanical AVR, pharmacy to resume  while admitted. INR therapeutic at 2.8.  **Home warfarin dose = 12.5mg  Tues/Thurs/Sat, 10mg  Mon/Wed/Fri/Sat.  Goal of Therapy:  INR 2.5-3.5   Plan:  -Warfarin 10mg  PO x1 tonight -Daily protime   Arrie Senate, PharmD, BCPS Clinical Pharmacist [phone] Please check AMION for all [place] numbers 05/29/2019

## 2019-05-30 DIAGNOSIS — I4821 Permanent atrial fibrillation: Secondary | ICD-10-CM

## 2019-05-30 DIAGNOSIS — G4733 Obstructive sleep apnea (adult) (pediatric): Secondary | ICD-10-CM

## 2019-05-30 DIAGNOSIS — Z9989 Dependence on other enabling machines and devices: Secondary | ICD-10-CM

## 2019-05-30 DIAGNOSIS — R7989 Other specified abnormal findings of blood chemistry: Secondary | ICD-10-CM

## 2019-05-30 DIAGNOSIS — E785 Hyperlipidemia, unspecified: Secondary | ICD-10-CM

## 2019-05-30 DIAGNOSIS — I50814 Right heart failure due to left heart failure: Secondary | ICD-10-CM

## 2019-05-30 DIAGNOSIS — I10 Essential (primary) hypertension: Secondary | ICD-10-CM

## 2019-05-30 LAB — BASIC METABOLIC PANEL
Anion gap: 14 (ref 5–15)
BUN: 45 mg/dL — ABNORMAL HIGH (ref 8–23)
CO2: 26 mmol/L (ref 22–32)
Calcium: 9.2 mg/dL (ref 8.9–10.3)
Chloride: 98 mmol/L (ref 98–111)
Creatinine, Ser: 1.81 mg/dL — ABNORMAL HIGH (ref 0.61–1.24)
GFR calc Af Amer: 43 mL/min — ABNORMAL LOW (ref >60)
GFR calc non Af Amer: 37 mL/min — ABNORMAL LOW (ref >60)
Glucose, Bld: 136 mg/dL — ABNORMAL HIGH (ref 70–99)
Potassium: 4.3 mmol/L (ref 3.5–5.1)
Sodium: 138 mmol/L (ref 135–145)

## 2019-05-30 LAB — PROTIME-INR
INR: 2.2 — ABNORMAL HIGH (ref 0.8–1.2)
Prothrombin Time: 24.5 seconds — ABNORMAL HIGH (ref 11.4–15.2)

## 2019-05-30 LAB — GLUCOSE, CAPILLARY
Glucose-Capillary: 135 mg/dL — ABNORMAL HIGH (ref 70–99)
Glucose-Capillary: 245 mg/dL — ABNORMAL HIGH (ref 70–99)

## 2019-05-30 MED ORDER — NITROGLYCERIN 0.4 MG SL SUBL: 0.4000 mg | SUBLINGUAL_TABLET | SUBLINGUAL | 2 refills | Status: AC | PRN

## 2019-05-30 MED ORDER — TORSEMIDE 20 MG PO TABS: 20.0000 mg | ORAL_TABLET | Freq: Two times a day (BID) | ORAL | Status: DC

## 2019-05-30 MED ORDER — TORSEMIDE 20 MG PO TABS: 20.0000 mg | ORAL_TABLET | Freq: Two times a day (BID) | ORAL | 2 refills | Status: DC

## 2019-05-30 NOTE — Discharge Summary (Addendum)
Physician Discharge Summary  Joseph Hoffman YCX:[id number] DOB: 1951-11-27 DOA: 05/27/2019  PCP: Orpah Melter, MD  Admit date: 05/27/2019 Discharge date: 05/30/2019 Consultations: Cardiology  Admitted From: home Disposition: home  Discharge Diagnoses:  Principal Problem:   Acute on chronic systolic heart failure, re-admitted 10/12/12 Active Problems:   S/P AVR, 09/29/12, [employer]. (discharged 10/05/12)   Permanent atrial fibrillation, since 1994   Sleep apnea, on C-pap   Chronic anticoagulation   Dyslipidemia, goal LDL below 70   Essential hypertension   Type II diabetes mellitus with renal manifestations (HCC)   Chest pain   Elevated troponin   CKD (chronic kidney disease), stage III (HCC)   Acute on chronic systolic (congestive) heart failure (HCC)   Depression   Brief/Interim Summary: 68 year old male with history of hypertension, diabetes, asthma, CKD stage III, chronic systolic CHF with EF 49%, CAD, atrial fibrillation and s/p mechanical aortic heart valve on Coumadin, obstructive sleep apnea on CPAP, depression and anxiety presents with complaints of chest pain and shortness of breath.  Patient was admitted recently to this [facility] for similar complaints and underwent nuclear stress test on June 12 showing moderate fixed defect within the apex and apical segment of the inferior wall but no evidence of reversible ischemia.  Last echo in May showed biatrial enlargement and biventricular reduced systolic function with EF 45 to 50%.He does have a history of sleep apnea for which he uses CPAP but was also issued 2 L nasal cannula recently.  1. Acute on chronic biventricular systolic CHF: Last EF in May 2020 was 45 to 50%.  He diuresed well with IV Lasix during the hospital course with drop in daily weights by 3 pounds, improved leg swellings and I's and O's showing net negative ~2 lits.Tapered O2 as tolerated and currently saturating well on RA. Will check walking O2 desat  studies prior to discharge.. Underwent TEE on 6/26 which showed "Moderate to severe global reduction in LV systolic function; biatrial enlargement; mild RVE with severe RV dysfunction; s/p AVR with mean gradient of 14 mmHg and moderate to severe perivalvular AI; mild to moderate MR".  Suspected to have fluid retention and chest pain episodes from valvular heart disease. Seen by cardiology for follow up today and recommended discharge on Torsemide 20mg  BID given  inadequate response to oral Lasix at home.  He should weigh himself daily and can take an extra 20 mg for a 3 pound weight gain in 24 hours or 5 pounds in 1 week.  He has been managed with Toprol-XL 37.5 mg twice daily and digoxin.  2. Recurrent chest pain with dyspnea/elevated troponin: Denies chest pain / dyspnea today.  Cardiology consultation and recommendations appreciated.  Patient had recent negative stress test.  Continue aspirin, statins and beta-blockers. Further management of valvular heart disease deferred to his primary cardiologist Dr Claiborne Billings who he plans to see on Monday.   3.  Chronic atrial fibrillation: Heart rate controlled on beta-blockers/digoxin.  CHA2DS2-VASc Scoreis 5, Continue Coumadin for long-term anticoagulation.  4.  Mechanical heart valve, St. Jude's aortic valve: Continue anticoagulation with Coumadin with goal of INR 2.5-3.5.TEE findings as above showing severe AR and he should follow up with structural heart team as outpatient through his primary cardiologist.   5.  Sleep apnea: Resume CPAP.  Patient denies using O2 in the past but was recently advised. He apparently had continuous nocturnal pulse ox done which did show hypoxia but he could not tolerate 6-minute walking desat studies.  Hence home  O2 is not being covered by Medicare right now and he is paying out-of-pocket.  Will obtain walking desat studies prior to discharge.  He is also scheduled for repeat sleep study as outpatient.  6.  CKD stage III:  Patient's creatinine has fluctuated between 1.5-2.1 in the setting of diuretics. Today stable at 1.8.  Discussed with patient regarding possible cardiorenal syndrome and need for IV diuresis superseding optimal renal function. He understands and will f/u with PCP for periodic monitoring  7.  Diabetes mellitus type 2: Resumed reduced dose of Lantus while here and his BG have remained <200. Patient advised to discontinue metformin in concern for #6 and hold Victoza to avoid hypoglycemia at home. He reports strict diet compliance at home although he does report better BG in the hospital than when he is at home.He may resume Jardiance/Amaryl for now and f/u PCP regarding home BG readings review and further titration of SQ and oral hypoglycemics as deemed appropriate.  Hemoglobin A1c earlier this month was 8.7.   8. Hyperlipidemia resume home meds  9.  Toe ulceration/?  Cellulitis: Recently completed antibiotic course.  No evidence of fever or white count.  Might have underlying stasis dermatitis at baseline.  He does have 1+ pitting edema, being diuresed.  Wound care consult appreciated. He plans to follow up with Podiatry upon discharge    Discharge Exam: Vitals:   05/29/19 2057 05/30/19 0616  BP:  131/62  Pulse: 68 62  Resp:  16  Temp:  97.6 F (36.4 C)  SpO2:  94%   Vitals:   05/29/19 1541 05/29/19 2016 05/29/19 2057 05/30/19 0616  BP: (!) 129/48 (!) 152/57  131/62  Pulse: (!) 50 (!) 52 68 62  Resp: 14 16  16   Temp:  97.6 F (36.4 C)  97.6 F (36.4 C)  TempSrc:  Oral  Oral  SpO2: 96% 97%  94%  Weight:    114.8 kg  Height:        General: Pt is alert, awake, not in acute distress Cardiovascular: RRR, S1/S2 +, no rubs, no gallops Respiratory: CTA bilaterally, no wheezing, no rhonchi Abdominal: Soft, NT, ND, bowel sounds + Extremities: no edema, no cyanosis  Discharge Instructions  Discharge Instructions    (HEART FAILURE PATIENTS) Call MD:  Anytime you have any of the  following symptoms: 1) 3 pound weight gain in 24 hours or 5 pounds in 1 week 2) shortness of breath, with or without a dry hacking cough 3) swelling in the hands, feet or stomach 4) if you have to sleep on extra pillows at night in order to breathe.   Complete by: As directed    Call MD for:  difficulty breathing, headache or visual disturbances   Complete by: As directed    Call MD for:  extreme fatigue   Complete by: As directed    Call MD for:  temperature >100.4   Complete by: As directed    Diet - low sodium heart healthy   Complete by: As directed    Discharge instructions   Complete by: As directed    You should weigh yourself daily and can take an extra 20 mg of Torsemide for a 3 pound weight gain in 24 hours or 5 pounds in 1 week.   Increase activity slowly   Complete by: As directed      Allergies as of 05/30/2019      Reactions   Iohexol Anaphylaxis   Niacin And Related  Flushing with immediate realese   Penicillins Other (See Comments)   Unknown.[name] Did it involve swelling of the face/tongue/throat, SOB, or low BP? Unknown Did it involve sudden or severe rash/hives, skin peeling, or any reaction on the inside of your mouth or nose? Unknown Did you need to seek medical attention at a hospital or doctor's office? Unknown When did it last happen?Childhood If all above answers are "NO", may proceed with cephalosporin use.      Medication List    STOP taking these medications   furosemide 40 MG tablet Commonly known as: LASIX   metFORMIN 500 MG 24 hr tablet Commonly known as: GLUCOPHAGE-XR   Victoza 18 MG/3ML Sopn Generic drug: liraglutide     TAKE these medications   acetaminophen 325 MG tablet Commonly known as: TYLENOL Take 2 tablets (650 mg total) by mouth every 4 (four) hours as needed for pain or fever.   albuterol 108 (90 Base) MCG/ACT inhaler Commonly known as: VENTOLIN HFA Inhale 2 puffs into the lungs every 6 (six) hours  as needed for wheezing or shortness of breath.   B-D ULTRAFINE III SHORT PEN 31G X 8 MM Misc Generic drug: Insulin Pen Needle   CENTRUM SPECIALIST HEART PO Take 1 tablet by mouth daily.   cholecalciferol 1000 units tablet Commonly known as: VITAMIN D Take 1,000 Units by mouth daily.   digoxin 0.125 MG tablet Commonly known as: LANOXIN TAKE 1 TABLET EVERY DAY What changed: when to take this   Easy Touch Lancets 30G/Twist Misc   empagliflozin 25 MG Tabs tablet Commonly known as: Jardiance Take 25 mg by mouth daily.   escitalopram 10 MG tablet Commonly known as: LEXAPRO Take 10 mg by mouth every evening.   ezetimibe 10 MG tablet Commonly known as: ZETIA Take 1 tablet (10 mg total) by mouth daily. What changed:   when to take this  additional instructions   fenofibrate 160 MG tablet Take 1 tablet (160 mg total) by mouth daily.   Fluticasone-Salmeterol 250-50 MCG/DOSE Aepb Commonly known as: ADVAIR Inhale 1 puff into the lungs as needed (shortness of breath). For asthma related symptoms   glimepiride 2 MG tablet Commonly known as: AMARYL Take 2 mg by mouth daily with breakfast.   GLUCOSAMINE 1500 COMPLEX PO Take 1,500 mg by mouth every evening.   Icosapent Ethyl 1 g Caps Commonly known as: Vascepa Take 2 capsules (2 g total) by mouth 2 (two) times daily.   insulin glargine 100 UNIT/ML injection Commonly known as: LANTUS Inject 20-35 Units into the skin 2 (two) times daily. Depending on Blood sugar reading   metoprolol succinate 25 MG 24 hr tablet Commonly known as: Toprol XL Take 1.5 tablets (37.5 mg total) by mouth 2 (two) times a day.   nitroGLYCERIN 0.4 MG SL tablet Commonly known as: NITROSTAT Place 1 tablet (0.4 mg total) under the tongue every 5 (five) minutes as needed for chest pain.   omega-3 acid ethyl esters 1 g capsule Commonly known as: LOVAZA Take 1 g by mouth daily.   saccharomyces boulardii 250 MG capsule Commonly known as:  FLORASTOR Take 250 mg by mouth 3 (three) times a week.   torsemide 20 MG tablet Commonly known as: Demadex Take 1 tablet (20 mg total) by mouth 2 (two) times daily.   vitamin C 1000 MG tablet Take 1,000 mg by mouth every evening.   warfarin 10 MG tablet Commonly known as: COUMADIN Take 10 mg by mouth See admin instructions.  Take on 10mg  on  Sun. Mon. Wed. Fri. What changed: Another medication with the same name was changed. Make sure you understand how and when to take each.   warfarin 5 MG tablet Commonly known as: COUMADIN Take as instructed with 10 mg. What changed:   how much to take  how to take this  when to take this  additional instructions       Allergies  Allergen Reactions  . Iohexol Anaphylaxis  . Niacin And Related     Flushing with immediate realese  . Penicillins Other (See Comments)    Unknown.[name]  Did it involve swelling of the face/tongue/throat, SOB, or low BP? Unknown Did it involve sudden or severe rash/hives, skin peeling, or any reaction on the inside of your mouth or nose? Unknown Did you need to seek medical attention at a hospital or doctor's office? Unknown When did it last happen?Childhood If all above answers are "NO", may proceed with cephalosporin use.       Discharge Condition: CODE STATUS: Diet recommendation: Recommendations for Outpatient Follow-up:  1. Follow up with PCP and primary cardiologist Dr Claiborne Billings in 1 week 2. Please obtain BMP/CBC in one week 3. Please follow up on the following pending results: Walking desat studies , BG check  Equipment/Devices: Resume Home 02 /CPAP     The results of significant diagnostics from this hospitalization (including imaging, microbiology, ancillary and laboratory) are listed below for reference.     Microbiology: Recent Results (from the past 240 hour(s))  SARS Coronavirus 2 (CEPHEID - Performed in [place] hospital lab), Hosp Order     Status:  None   Collection Time: 05/28/19  3:48 AM   Specimen: Nasopharyngeal Swab  Result Value Ref Range Status   SARS Coronavirus 2 NEGATIVE NEGATIVE Final    Comment: (NOTE) If result is NEGATIVE SARS-CoV-2 target nucleic acids are NOT DETECTED. The SARS-CoV-2 RNA is generally detectable in upper and lower  respiratory specimens during the acute phase of infection. The lowest  concentration of SARS-CoV-2 viral copies this assay can detect is 250  copies / mL. A negative result does not preclude SARS-CoV-2 infection  and should not be used as the sole basis for treatment or other  patient management decisions.  A negative result may occur with  improper specimen collection / handling, submission of specimen other  than nasopharyngeal swab, presence of viral mutation(s) within the  areas targeted by this assay, and inadequate number of viral copies  (<250 copies / mL). A negative result must be combined with clinical  observations, patient history, and epidemiological information. If result is POSITIVE SARS-CoV-2 target nucleic acids are DETECTED. The SARS-CoV-2 RNA is generally detectable in upper and lower  respiratory specimens dur ing the acute phase of infection.  Positive  results are indicative of active infection with SARS-CoV-2.  Clinical  correlation with patient history and other diagnostic information is  necessary to determine patient infection status.  Positive results do  not rule out bacterial infection or co-infection with other viruses. If result is PRESUMPTIVE POSTIVE SARS-CoV-2 nucleic acids MAY BE PRESENT.   A presumptive positive result was obtained on the submitted specimen  and confirmed on repeat testing.  While 2019 novel coronavirus  (SARS-CoV-2) nucleic acids may be present in the submitted sample  additional confirmatory testing may be necessary for epidemiological  and / or clinical management purposes  to differentiate between  SARS-CoV-2 and other  Sarbecovirus currently known to infect humans.  If clinically indicated additional testing with an alternate test  methodology [phone]) is advised. The SARS-CoV-2 RNA is generally  detectable in upper and lower respiratory sp ecimens during the acute  phase of infection. The expected result is Negative. Fact Sheet for Patients:  StrictlyIdeas.no Fact Sheet for Healthcare Providers: BankingDealers.co.za This test is not yet approved or cleared by the [place] FDA and has been authorized for detection and/or diagnosis of SARS-CoV-2 by FDA under an Emergency Use Authorization (EUA).  This EUA will remain in effect (meaning this test can be used) for the duration of the COVID-19 declaration under Section 564(b)(1) of the Act, 21 U.S.C. section 360bbb-3(b)(1), unless the authorization is terminated or revoked sooner. Performed at [facility], [place] [address], [place], [place] [address]      Labs: BNP (last 3 results) Recent Labs    04/09/19 1317 05/12/19 1426 05/28/19 0617  BNP 222.9* 297.0* 294.7*   Basic Metabolic Panel: Recent Labs  Lab 05/27/19 2256 05/29/19 0403 05/30/19 0645  NA 136 137 138  K 4.8 3.8 4.3  CL 104 101 98  CO2 21* 24 26  GLUCOSE 235* 162* 136*  BUN 49* 47* 45*  CREATININE 1.68* 1.80* 1.81*  CALCIUM 9.3 9.3 9.2   Liver Function Tests: Recent Labs  Lab 05/28/19 0617  AST 37  ALT 28  ALKPHOS 27*  BILITOT 1.0  PROT 6.9  ALBUMIN 3.8   No results for input(s): LIPASE, AMYLASE in the last 168 hours. No results for input(s): AMMONIA in the last 168 hours. CBC: Recent Labs  Lab 05/27/19 2256  WBC 9.2  HGB 13.1  HCT 42.2  MCV 85.4  PLT 204   Cardiac Enzymes: No results for input(s): CKTOTAL, CKMB, CKMBINDEX, TROPONINI in the last 168 hours. BNP: Invalid input(s): POCBNP CBG: Recent Labs  Lab 05/29/19 0727 05/29/19 1140 05/29/19 1624 05/29/19 2121 05/30/19 0750  GLUCAP 147*  122* 75 199* 135*   D-Dimer No results for input(s): DDIMER in the last 72 hours. Hgb A1c No results for input(s): HGBA1C in the last 72 hours. Lipid Profile No results for input(s): CHOL, HDL, LDLCALC, TRIG, CHOLHDL, LDLDIRECT in the last 72 hours. Thyroid function studies Recent Labs    05/28/19 1500  TSH 1.184   Anemia work up No results for input(s): VITAMINB12, FOLATE, FERRITIN, TIBC, IRON, RETICCTPCT in the last 72 hours. Urinalysis    Component Value Date/Time   COLORURINE YELLOW 09/25/2012 1506   APPEARANCEUR CLEAR 09/25/2012 1506   LABSPEC 1.045 (H) 09/25/2012 1506   PHURINE 6.0 09/25/2012 1506   GLUCOSEU >1000 (A) 09/25/2012 1506   HGBUR NEGATIVE 09/25/2012 1506   BILIRUBINUR NEGATIVE 09/25/2012 1506   KETONESUR NEGATIVE 09/25/2012 1506   PROTEINUR NEGATIVE 09/25/2012 1506   UROBILINOGEN 0.2 09/25/2012 1506   NITRITE NEGATIVE 09/25/2012 1506   LEUKOCYTESUR NEGATIVE 09/25/2012 1506   Sepsis Labs Invalid input(s): PROCALCITONIN,  WBC,  LACTICIDVEN Microbiology Recent Results (from the past 240 hour(s))  SARS Coronavirus 2 (CEPHEID - Performed in [place] hospital lab), Hosp Order     Status: None   Collection Time: 05/28/19  3:48 AM   Specimen: Nasopharyngeal Swab  Result Value Ref Range Status   SARS Coronavirus 2 NEGATIVE NEGATIVE Final    Comment: (NOTE) If result is NEGATIVE SARS-CoV-2 target nucleic acids are NOT DETECTED. The SARS-CoV-2 RNA is generally detectable in upper and lower  respiratory specimens during the acute phase of infection. The lowest  concentration of SARS-CoV-2 viral copies this assay  can detect is 250  copies / mL. A negative result does not preclude SARS-CoV-2 infection  and should not be used as the sole basis for treatment or other  patient management decisions.  A negative result may occur with  improper specimen collection / handling, submission of specimen other  than nasopharyngeal swab, presence of viral mutation(s)  within the  areas targeted by this assay, and inadequate number of viral copies  (<250 copies / mL). A negative result must be combined with clinical  observations, patient history, and epidemiological information. If result is POSITIVE SARS-CoV-2 target nucleic acids are DETECTED. The SARS-CoV-2 RNA is generally detectable in upper and lower  respiratory specimens dur ing the acute phase of infection.  Positive  results are indicative of active infection with SARS-CoV-2.  Clinical  correlation with patient history and other diagnostic information is  necessary to determine patient infection status.  Positive results do  not rule out bacterial infection or co-infection with other viruses. If result is PRESUMPTIVE POSTIVE SARS-CoV-2 nucleic acids MAY BE PRESENT.   A presumptive positive result was obtained on the submitted specimen  and confirmed on repeat testing.  While 2019 novel coronavirus  (SARS-CoV-2) nucleic acids may be present in the submitted sample  additional confirmatory testing may be necessary for epidemiological  and / or clinical management purposes  to differentiate between  SARS-CoV-2 and other Sarbecovirus currently known to infect humans.  If clinically indicated additional testing with an alternate test  methodology [phone]) is advised. The SARS-CoV-2 RNA is generally  detectable in upper and lower respiratory sp ecimens during the acute  phase of infection. The expected result is Negative. Fact Sheet for Patients:  StrictlyIdeas.no Fact Sheet for Healthcare Providers: BankingDealers.co.za This test is not yet approved or cleared by the [place] FDA and has been authorized for detection and/or diagnosis of SARS-CoV-2 by FDA under an Emergency Use Authorization (EUA).  This EUA will remain in effect (meaning this test can be used) for the duration of the COVID-19 declaration under Section 564(b)(1) of the Act,  21 U.S.C. section 360bbb-3(b)(1), unless the authorization is terminated or revoked sooner. Performed at [facility], [place] [address] [place], [place] [address]     Procedures/Studies: Dg Chest 2 View  Result Date: 05/27/2019 CLINICAL DATA:  Chest pain EXAM: CHEST - 2 VIEW COMPARISON:  May 12, 2019 FINDINGS: The heart size is enlarged. The patient is status post prior median sternotomy. There is a small right-sided pleural effusion which has increased in size from prior study. There is a right-sided airspace opacity which is favored to represent compressive atelectasis. There is no acute osseous abnormality. Degenerative changes are noted throughout the thoracic spine. There is no pneumothorax. IMPRESSION: 1. Cardiomegaly with a small right-sided pleural effusion which has increased in size from prior study. 2. Right basilar airspace opacity which is favored to represent atelectasis. Electronically Signed   By: Constance Holster M.D.   On: 05/27/2019 23:19   Dg Chest 2 View  Result Date: 05/12/2019 CLINICAL DATA:  Chest pain, shortness of breath, and leg swelling for few days, history CHF, coronary artery disease post MI, hypertension, diabetes mellitus, atrial fibrillation, prior AVR EXAM: CHEST - 2 VIEW COMPARISON:  04/09/2019 FINDINGS: Enlargement of cardiac silhouette post median sternotomy and AVR. Pulmonary vascular congestion.  No mediastinal contours normal. RIGHT pleural effusion and basilar atelectasis increased from prior study. Minimal interstitial prominence, likely representing mild chronic failure. No acute pulmonary edema or segmental consolidation. No  pneumothorax. Scattered endplate spur formation thoracic spine. IMPRESSION: Enlargement of cardiac silhouette with slight pulmonary vascular congestion and minimal chronic failure. Slightly increased RIGHT pleural effusion and basilar atelectasis. Electronically Signed   By: Lavonia Dana M.D.   On: 05/12/2019 15:16   Nm Pulmonary  Perfusion  Result Date: 05/12/2019 CLINICAL DATA:  Shortness of breath EXAM: NUCLEAR MEDICINE PERFUSION LUNG SCAN TECHNIQUE: Perfusion images were obtained in multiple projections after intravenous injection of radiopharmaceutical. Ventilation scans intentionally deferred if perfusion scan and chest x-ray adequate for interpretation during COVID 19 epidemic. RADIOPHARMACEUTICALS:  1.75 mCi Tc-50m MAA IV COMPARISON:  None FINDINGS: Enlargement of cardiac silhouette. Perfusion images are otherwise normal. No segmental or subsegmental perfusion defects. Ventilation exam not performed. IMPRESSION: Normal perfusion lung scan. Electronically Signed   By: Lavonia Dana M.D.   On: 05/12/2019 17:45   Nm Myocar Multi W/spect W/wall Motion / Ef  Result Date: 05/15/2019 CLINICAL DATA:  coronary artery disease. Hypertension. Diabetes. Shortness of breath. EXAM: MYOCARDIAL IMAGING WITH SPECT (REST AND PHARMACOLOGIC-STRESS) GATED LEFT VENTRICULAR WALL MOTION STUDY LEFT VENTRICULAR EJECTION FRACTION TECHNIQUE: Standard myocardial SPECT imaging was performed after resting intravenous injection of 10 mCi Tc-15m tetrofosmin. Subsequently, intravenous infusion of Lexiscan was performed under the supervision of the Cardiology staff. At peak effect of the drug, 30 mCi Tc-75m tetrofosmin was injected intravenously and standard myocardial SPECT imaging was performed. Quantitative gated imaging was also performed to evaluate left ventricular wall motion, and estimate left ventricular ejection fraction. COMPARISON:  Chest radiograph 05/12/2019. FINDINGS: Perfusion: Fixed defect within the apex and apical segment of the inferior wall. This is moderate in size and medium in severity. No areas of reversibility to suggest inducible ischemia. Wall Motion: Global hypokinesis.  Left ventricular dilatation. Left Ventricular Ejection Fraction: 31 % End diastolic volume 366 ml End systolic volume 294 ml IMPRESSION: 1. No reversible ischemia. Fixed  defect in the apex and apical inferior wall is likely related to prior infarct. 2. Global hypokinesis. 3. Left ventricular ejection fraction 31% 4. Non invasive risk stratification*: High-based on ejection fraction of less than 35% *2012 Appropriate Use Criteria for Coronary Revascularization Focused Update: J Am Coll Cardiol. 7654;65(0):354-656. http://content.airportbarriers.com.aspx?articleid=[email or web address] Electronically Signed   By: Abigail Miyamoto M.D.   On: 05/15/2019 12:28   Vas [place] Lower Extremity Venous (dvt) (mc And Wl 7a-7p)  Result Date: 05/13/2019  Lower Venous Study Indications: Swelling, Erythema, and SOB.  Performing Technologist: Toma Copier RVS  Examination Guidelines: A complete evaluation includes B-mode imaging, spectral Doppler, color Doppler, and power Doppler as needed of all accessible portions of each vessel. Bilateral testing is considered an integral part of a complete examination. Limited examinations for reoccurring indications may be performed as noted.  +-----+---------------+---------+-----------+----------+-------+ RIGHTCompressibilityPhasicitySpontaneityPropertiesSummary +-----+---------------+---------+-----------+----------+-------+ CFV  Full           Yes      Yes                          +-----+---------------+---------+-----------+----------+-------+ SFJ  Full                                                 +-----+---------------+---------+-----------+----------+-------+   +---------+---------------+---------+-----------+----------+-------------------+ LEFT     CompressibilityPhasicitySpontaneityPropertiesSummary             +---------+---------------+---------+-----------+----------+-------------------+ CFV      Full  Yes      Yes                                      +---------+---------------+---------+-----------+----------+-------------------+ SFJ      Full                                                              +---------+---------------+---------+-----------+----------+-------------------+ FV Prox  Full           Yes      Yes                                      +---------+---------------+---------+-----------+----------+-------------------+ FV Mid   Full                                                             +---------+---------------+---------+-----------+----------+-------------------+ FV DistalFull           Yes      Yes                                      +---------+---------------+---------+-----------+----------+-------------------+ PFV      Full           Yes      Yes                                      +---------+---------------+---------+-----------+----------+-------------------+ POP      Full           Yes      Yes                                      +---------+---------------+---------+-----------+----------+-------------------+ PTV      Full                                                             +---------+---------------+---------+-----------+----------+-------------------+ PERO     Full                                         Difficult to image                                                        due to the  tightness of the                                                          calf                +---------+---------------+---------+-----------+----------+-------------------+   Left Technical Findings: Doppler waveform are pulsitile throughout consistent with a possible fluid overload.   Summary: Right: There is no evdence of a common femoral vein obstruction Left: There is no evidence of deep vein thrombosis in the lower extremity. There is no evdence of a popliteal cystic structure. See technical findings listed above.  *See table(s) above for measurements and observations. Electronically signed by Curt Jews MD on 05/13/2019 at 5:02:47 PM.    Final      (Echo, Carotid, EGD, Colonoscopy, ERCP)  Time coordinating discharge: Over 30 minutes  SIGNED:   Guilford Shi, MD  [facility] 05/30/2019, 11:42 AM Pager   If 7PM-7AM, please contact night-coverage [email or web address] Password [email or web address]

## 2019-05-30 NOTE — Progress Notes (Signed)
Progress Note  Patient Name: Joseph Hoffman Date of Encounter: 05/30/2019  Primary Cardiologist: Shelva Majestic, MD   Subjective   He is doing much better today.  His breathing and leg swelling have improved.  He is eager to go home.  He has a virtual visit with Dr. Claiborne Billings this upcoming Monday.  Inpatient Medications    Scheduled Meds: . cholecalciferol  1,000 Units Oral QHS  . digoxin  0.125 mg Oral QHS  . escitalopram  10 mg Oral QPM  . ezetimibe  10 mg Oral QHS  . fenofibrate  160 mg Oral Daily  . furosemide  40 mg Intravenous Q12H  . insulin aspart  0-9 Units Subcutaneous TID WC  . insulin glargine  30 Units Subcutaneous BID  . metoprolol succinate  37.5 mg Oral BID  . mometasone-formoterol  2 puff Inhalation BID  . multivitamin with minerals  1 tablet Oral Daily  . omega-3 acid ethyl esters  2 g Oral BID  . sodium chloride flush  3 mL Intravenous Q12H  . vitamin C  1,000 mg Oral QHS  . Warfarin - Pharmacist Dosing Inpatient   Does not apply q1800   Continuous Infusions: . sodium chloride     PRN Meds: sodium chloride, acetaminophen, albuterol, dextromethorphan-guaiFENesin, morphine injection, nitroGLYCERIN, ondansetron (ZOFRAN) IV, sodium chloride flush   Vital Signs    Vitals:   05/29/19 1541 05/29/19 2016 05/29/19 2057 05/30/19 0616  BP: (!) 129/48 (!) 152/57  131/62  Pulse: (!) 50 (!) 52 68 62  Resp: 14 16  16   Temp:  97.6 F (36.4 C)  97.6 F (36.4 C)  TempSrc:  Oral  Oral  SpO2: 96% 97%  94%  Weight:    114.8 kg  Height:        Intake/Output Summary (Last 24 hours) at 05/30/2019 1045 Last data filed at 05/30/2019 0600 Gross per 24 hour  Intake 963 ml  Output 2060 ml  Net -1097 ml   Filed Weights   05/28/19 0526 05/29/19 0600 05/30/19 0616  Weight: 116 kg 116.3 kg 114.8 kg    Telemetry    Controlled atrial fibrillation, PVCs- Personally Reviewed   Physical Exam   GEN: No acute distress.   Neck: No JVD Cardiac:  Irregular rhythm,  mechanical click appreciated.  Respiratory: Clear to auscultation bilaterally. GI: Soft, nontender, non-distended  MS:  Trace left leg edema with mild erythema; No deformity. Neuro:  Nonfocal  Psych: Normal affect   Labs    Chemistry Recent Labs  Lab 05/27/19 2256 05/28/19 0617 05/29/19 0403 05/30/19 0645  NA 136  --  137 138  K 4.8  --  3.8 4.3  CL 104  --  101 98  CO2 21*  --  24 26  GLUCOSE 235*  --  162* 136*  BUN 49*  --  47* 45*  CREATININE 1.68*  --  1.80* 1.81*  CALCIUM 9.3  --  9.3 9.2  PROT  --  6.9  --   --   ALBUMIN  --  3.8  --   --   AST  --  37  --   --   ALT  --  28  --   --   ALKPHOS  --  27*  --   --   BILITOT  --  1.0  --   --   GFRNONAA 41*  --  37* 37*  GFRAA 47*  --  43* 43*  ANIONGAP 11  --  12 14     Hematology Recent Labs  Lab 05/27/19 2256  WBC 9.2  RBC 4.94  HGB 13.1  HCT 42.2  MCV 85.4  MCH 26.5  MCHC 31.0  RDW 18.2*  PLT 204    Cardiac EnzymesNo results for input(s): TROPONINI in the last 168 hours. No results for input(s): TROPIPOC in the last 168 hours.   BNP Recent Labs  Lab 05/28/19 0617  BNP 342.8*     DDimer No results for input(s): DDIMER in the last 168 hours.   Radiology    No results found.  Cardiac Studies   TEE (05/29/19):  1. The left ventricle has moderate-severely reduced systolic function, with an ejection fraction of 30-35%. Left ventricular diffuse hypokinesis.  2. The right ventricle has severely reduced systolic function. The cavity was mildly enlarged.  3. Left atrial size was severely dilated.  4. Right atrial size was severely dilated.  5. The mitral valve is grossly normal. No evidence of mitral valve stenosis.  6. The tricuspid valve was grossly normal.  7. Aortic valve regurgitation is severe by color flow Doppler.  8. There is evidence of mild plaque in the descending aorta.  9. Moderate to severe global reduction in LV systolic function; mild RVE with severe RV dysfunction; severe  biatrial enlargement; s/p mechanical AVR with mean gradient of 14 mmHg (both leaflets appear to be mobile); severe perivalvular AI; mild MR and  TR.  Nuclear stress test 05/15/19 IMPRESSION: 1. No reversible ischemia. Fixed defect in the apex and apical inferior wall is likely related to prior infarct. 2. Global hypokinesis. 3. Left ventricular ejection fraction 31% 4. Non invasive risk stratification*: High-based on ejection fraction of less than 35%  Patient Profile     68 y.o. male with a hx of permanent Afib since 1994, DM2, HTN, OSA on CPAP, mechanical AVR in 2013 on coumadin, chronic systolic HF EF 28-36%,OQH stage IIIand hepatitiswho is being seen for the evaluation of CHF. This is his 3rd hospitalization for shortness of breath, orthopnea and edema within 2 months. Not responsive to extra lasix doses at home.   Assessment & Plan    1.  Acute on chronic biventricular systolic heart failure: LVEF 30 to 35% with severely reduced right ventricular systolic function and severe aortic regurgitation.  He can be discharged on torsemide 20 mg twice daily.  He should weigh himself daily and can take an extra 20 mg for a 3 pound weight gain in 24 hours or 5 pounds in 1 week.  He has been managed with Toprol-XL 37.5 mg twice daily and digoxin.  He will need follow-up with the structural heart team.  No ischemia seen with nuclear stress test.  2.  Permanent atrial fibrillation: Heart rate is controlled on Toprol-XL and digoxin.  He is on warfarin.  3.  Mechanical aortic valve replacement: He has severe aortic valvular regurgitation by TEE.  He will need follow-up with the structural heart team to determine further management, as this is certainly leading to recurrent heart failure.  4.  Chronic kidney disease stage III: Creatinine 1.81.  I will stop IV diuretics today.  He can be discharged on torsemide as detailed in #1.  5.  Obstructive sleep apnea: Using CPAP.  6.  Hyperlipidemia: Statin  intolerant and is on Vascepa, Zetia, and fenofibrate.  LDL 74 on 05/13/2019.   [facility] will sign off.   Medication Recommendations: As above Other recommendations (labs, testing, etc): Basic metabolic panel next week Follow  up as an outpatient: He has a virtual visit with Dr. Claiborne Billings this upcoming Monday.  For questions or updates, please contact [place] Please consult [email or web address] for contact info under Cardiology/STEMI.      Signed, Kate Sable, MD  05/30/2019, 10:45 AM

## 2019-05-30 NOTE — Progress Notes (Addendum)
SATURATION QUALIFICATIONS: (This note is used to comply with regulatory documentation for home oxygen)  Patient Saturations on Room Air at Rest = 89%  Patient Saturations on Room Air while Ambulating = 91%%  Patient Saturations on 0 Liters of oxygen while Ambulating = 91%  Please briefly explain why patient needs home oxygen: Pt already has O2 at home

## 2019-05-31 ENCOUNTER — Encounter (HOSPITAL_COMMUNITY): Payer: Self-pay | Admitting: Cardiology

## 2019-06-01 ENCOUNTER — Ambulatory Visit: Payer: Medicare Other | Admitting: Cardiovascular Disease

## 2019-06-01 ENCOUNTER — Telehealth: Payer: Medicare Other | Admitting: Cardiovascular Disease

## 2019-06-02 ENCOUNTER — Telehealth: Payer: Self-pay | Admitting: Cardiovascular Disease

## 2019-06-02 NOTE — Telephone Encounter (Signed)
New Message:   Pt called and said he would like to talk to Dr Evette Georges nurse. He would not give any other information.

## 2019-06-02 NOTE — Telephone Encounter (Signed)
Please place patient on Dr.Kelly for 2:00 tomorrow as a virtual visit.  Thank you!

## 2019-06-02 NOTE — Telephone Encounter (Signed)
Spoke with pt and was not aware appt yesterday was in person Pt thought was tele visit Per pt needs to speak with Dr Claiborne Billings  Pt has been in hospital 3 x in 7 weeks severe CHF and also recently dx with severe AVR Pt does not wish to see APP only wants to speak to Dr Claiborne Billings via phone unable to come in to office due to severe SOB and can't walk.Will forward to Dr Claiborne Billings for review .Adonis Housekeeper

## 2019-06-02 NOTE — Telephone Encounter (Signed)
Patient scheduled.

## 2019-06-03 ENCOUNTER — Encounter: Payer: Self-pay | Admitting: Cardiovascular Disease

## 2019-06-03 ENCOUNTER — Telehealth (INDEPENDENT_AMBULATORY_CARE_PROVIDER_SITE_OTHER): Payer: Medicare Other | Admitting: Cardiovascular Disease

## 2019-06-03 VITALS — BP 149/76 | HR 83 | Temp 97.5°F | Ht 71.0 in | Wt 254.0 lb

## 2019-06-03 DIAGNOSIS — I351 Nonrheumatic aortic (valve) insufficiency: Secondary | ICD-10-CM | POA: Diagnosis not present

## 2019-06-03 DIAGNOSIS — E119 Type 2 diabetes mellitus without complications: Secondary | ICD-10-CM | POA: Diagnosis not present

## 2019-06-03 DIAGNOSIS — Z952 Presence of prosthetic heart valve: Secondary | ICD-10-CM

## 2019-06-03 DIAGNOSIS — N183 Chronic kidney disease, stage 3 unspecified: Secondary | ICD-10-CM

## 2019-06-03 DIAGNOSIS — I4821 Permanent atrial fibrillation: Secondary | ICD-10-CM

## 2019-06-03 DIAGNOSIS — I10 Essential (primary) hypertension: Secondary | ICD-10-CM

## 2019-06-03 DIAGNOSIS — I130 Hypertensive heart and chronic kidney disease with heart failure and stage 1 through stage 4 chronic kidney disease, or unspecified chronic kidney disease: Secondary | ICD-10-CM | POA: Diagnosis not present

## 2019-06-03 DIAGNOSIS — I5023 Acute on chronic systolic (congestive) heart failure: Secondary | ICD-10-CM | POA: Diagnosis not present

## 2019-06-03 DIAGNOSIS — G4733 Obstructive sleep apnea (adult) (pediatric): Secondary | ICD-10-CM

## 2019-06-03 DIAGNOSIS — E782 Mixed hyperlipidemia: Secondary | ICD-10-CM

## 2019-06-03 DIAGNOSIS — Z9989 Dependence on other enabling machines and devices: Secondary | ICD-10-CM

## 2019-06-03 DIAGNOSIS — Z79899 Other long term (current) drug therapy: Secondary | ICD-10-CM | POA: Diagnosis not present

## 2019-06-03 DIAGNOSIS — Z7901 Long term (current) use of anticoagulants: Secondary | ICD-10-CM | POA: Diagnosis not present

## 2019-06-03 DIAGNOSIS — Z794 Long term (current) use of insulin: Secondary | ICD-10-CM

## 2019-06-03 MED ORDER — ISOSORBIDE MONONITRATE ER 30 MG PO TB24: 30.0000 mg | ORAL_TABLET | Freq: Every day | ORAL | 3 refills | Status: DC

## 2019-06-03 MED ORDER — HYDRALAZINE HCL 25 MG PO TABS: 12.5000 mg | ORAL_TABLET | Freq: Two times a day (BID) | ORAL | 3 refills | Status: DC

## 2019-06-03 NOTE — Progress Notes (Signed)
Virtual Visit via Video Note   This visit type was conducted due to national recommendations for restrictions regarding the COVID-19 Pandemic (e.g. social distancing) in an effort to limit this patient's exposure and mitigate transmission in our community.  Due to his co-morbid illnesses, this patient is at least at moderate risk for complications without adequate follow up.  This format is felt to be most appropriate for this patient at this time.  All issues noted in this document were discussed and addressed.  A limited physical exam was performed with this format.  Please refer to the patient's chart for his consent to telehealth for [facility].   Date:  06/05/2019   ID:  Joseph Hoffman, Joseph Hoffman Dec 16, 1950, MRN [id number]  Patient Location: Home Provider Location: Office  PCP:  Orpah Melter, MD  Cardiologist:  Shelva Majestic, MD  Electrophysiologist:  None   Evaluation Performed:  Follow-Up Visit  Chief Complaint: Shortness of breath  History of Present Illness:    Joseph Hoffman is a 68 y.o. male who has a history of permanent atrial fibrillation dating back to 42. He has a history of type 2 diabetes mellitus. He developed progressive aortic valve stenosis and 09/29/2012 underwent St. Jude aortic valve replacement by [name]. Additional problems also include hypertension, obstructive sleep apnea on CPAP and in 2012 he obtained a new CPAP machine. He had some transient CHF symptoms after his Lasix had been held following his surgery.   An echo Doppler study in October 2013 following his surgery showed an EF of 55-60% and a St. Jude aortic valve was well seated with peak and mean gradients of 30 and 22 mm. There was no mention of aortic insufficiency.  When I saw him in 2015, his weight had been fairly stable but he's been unable to lose additional weight. AF, rate was controlled. He had noted some intermittent ankle edema.  He retired in December 2016. He was seen in  July 2018 by Melvyn Neth for preoperative clearance prior to undergoing colonoscopy. He has been on Coumadin both for atrial fibrillation as well as his mechanical valve.  He was  hospitalized overnight on September 11 and discharged on 08/14/2017 at [facility] in [place]. I reviewed these records. He presented with acute on chronic CHF exacerbation and received IV Lasix. His troponin was mildly increased secondary to acute onset CHF exacerbation. His blood sugar was elevated. He was discharged following day. NTproBNP was markedly elevated at 1574. He underwent an echo Doppler study which showed an EF of 50-55%. The mitral valve leaflets were calcified and there was 2+ MR. His prosthetic aortic valve was not well visualized and there was 2+ moderate aortic regurgitation and moderate tricuspid regurgitation. He was in atrial fibrillation.   After not seeing him in over 3 years, I saw him for evaluation in 2018 following his hospitalization. He has permanent atrial fibrillation and I recommended initiation of Toprol-XL will 25 mg daily for improved rate control and blood pressure control. With his stable renal function. I felt he was a good candidate for Jardiance with reference to his diabetes mellitus and particularly with his recent CHF episode and initiated therapy with 10 mg.   Since I last saw him in November 2018, he underwent a follow-up echo Doppler study on Apr 03, 2018. This showed mild LVH with normal systolic function with an EF of 50 to 55% without regional wall motion abnormalities. His mechanical prosthesis in the aortic valve was well-seated. Mean aortic  gradient was 26 mm with a peak gradient of 36 mm. There is mild AR. There was mitral annular calcification with mild MR. He had moderate to severe biatrial enlargement. RV size is mildly dilated.  He had seen Dr. Maudie Mercury to reestablish primary care since Dr. Wilson Singer had retired. No adjustments were made to his  regimen.  He is now employed as a Government social research officer for [employer]. He denies any chest pain or CHF symptoms. He has noticed significant improvement since I initiated Jardiance. He denies any shortness of breath , palpitations, PND orthopnea. He had undergone laboratory on Apr 03, 2018 which showed a cholesterol of 184, HDL 26, LDL 90, but triglycerides were elevated at 339. I initiated Vascepa 2 capsules twice a day.   I saw him in June 2019. At that time I recommended a trial of low-dose Crestor 10 mg 2 times per week. I also recommended further titration of Jardiance to 25 mg daily.  When I  saw him in October 2019 he admitted that he was not doing well with reference to his diet. He had stopped taking Crestor. He washaving a lot of tomatoes and cheese. He had seen his primary doctor and apparently there is been significant delay in him being seen by endocrinology in [place] with an initial appointment set for late March 2020. He underwent a follow-up echo Doppler study on Apr 03, 2018 which showed normal LV function. He had a well-seated mechanical aortic valve prosthesis with mild AR. There was mitral annular calcification with mild MR and evidence for biatrial enlargement. He recently had laboratory which confirmed he is not been doing well with his diet. Total cholesterol was 213 but triglycerides had risen to 672, HDL was low at 20 and LDL could not be calculated. His potassium was 5.5. Glucose was 315. Hemoglobin A1c was markedly elevated at 10.1. I spent considerable time with him discussing reduction of potassium containing foods.Irbesartanwas held. He was started on for Vascepa 2 capsules twice a day and since he could not tolerate Crestor, I added Zetia 10 mg to his fenofibrate. Repeat laboratory on November 21, 2018 showed a creatinine of 1.52, potassium 5.2. Lipid  studies were significantly improved with total cholesterol improving from 213 down to 155, triglycerides  from 672 down to 253 the LDL was still also elevated but improved at 51. LDL was 79. He had established endocrinologic care with Dr. Amalia Hailey at [place] [facility] [place].   When I saw him on December 11, 2018 he denied any chest pain or shortness of breath. His blood pressure was elevated and I suggested the addition of Toprol-XL 25 mg daily which would be also helpful for rate control of his permanent atrial fibrillation.  He continued to be on furosemide in addition to [place] and was back on Victoza and metformin after not tolerating Ozempic.  We discussed possible candidacy for PCSK9 inhibition.  He has been evaluated by Rosaria Ferries, NP and Almyra Deforest, PAC in the office and was  hospitalized on Apr 09, 2019  after experiencing increasing heart failure symptoms, 8 lb weight gain, PND and orthopnea. A repeat echo on Apr 11, 2019 showed an EF of 45 to 50%, there was severe biatrial enlargement, mild to moderate mitral regurgitation with mitral annular calcification, mild to moderate mitral stenosis, and mild aortic insufficiency.He was diuresed with Lasix 80 mg twice a day with improvement in symptomatology.  Troponin was mildly elevated at 0.14 and trended to 0.10.  Creatinine  increased to 2.08 with diuresis  and improved to 1.78 on day of discharge.  During his hospitalization he required oxygen supplementation and oxygen was bled into his nocturnal CPAP use.  I evaluated him in follow-up of his May hospitalization in a video telemedicine visit.  At that time he felt that he still had some shortness of breath and had swelling in his left foot.  He was not sleeping as well since his current CPAP device at home does not have supplemental oxygen currently bled into it.  He had noticed a marked difference in his sleep quality from when in the hospital to currently as result of the absence of supplemental nocturnal oxygen.  He has continued to be on Jardiance 25 mg, Vascepa 2 capsules twice a day,  and needs renewals.  He has had difficulty with continued constipation.  During that evaluation, I recommended that he could take an extra Lasix 80 mg on an as-needed basis in the afternoon depending upon his shortness of breath and leg swelling symptomatology.  While in this hospital, his creatinine had increased to 2.08 with Lasix 80 mg IV twice a day and had improved to 1.78 at discharge.   Subsequently, he was readmitted to the hospital in June with recurrent shortness of breath.  His symptoms improved with IV diuresis.  He also underwent a nuclear stress test to make certain he was not having any significant ischemia contributing to his dyspnea.  This revealed a moderate fixed defect at the apex and apical segment of the inferior wall without evidence for reversible ischemia.  EF on echo during his May hospitalization on Apr 10, 2019 was 45 to 50% with moderate LVH, global hypokinesis, high filling pressure, severe biatrial enlargement, mechanical aortic valve with mild regurgitation, calcified mitral valve annulus with mild to moderate MS and mild to moderate MR with mild TR and estimated RV systolic pressure 43 mm.  He had gone home feeling improved after his June hospitalization but was readmitted on June 24 with recurrent CHF symptomatology.  He underwent a transesophageal echo on June 26 which now showed worsening LV function with EF at 30 to 35%, mild right ventricular enlargement with severe RV dysfunction, severe biatrial enlargement, a mean aortic valve gradient of 14 mm with severe perivalvular aortic insufficiency with mild MR and TR.  He was discharged home on May 30, 2019.  He still admits to some leg swelling.  He has been on torsemide 20 mg twice a day.  He now presents for a telemedicine visit with me shortly after discharge.   The patient does not have symptoms concerning for COVID-19 infection (fever, chills, cough, or new shortness of breath).    Past Medical History:  Diagnosis  Date   Anxiety    Aortic stenosis    Arthritis    Asthma    Asymptomatic LV dysfunction, EF 45%, normal coronary arteries on cardiac cath 09/18/12 09/30/2012   CHF (congestive heart failure) (HCC)    Chronic anticoagulation, on Xarelto prior to admit 09/30/2012   DM (diabetes mellitus) ([place]) 09/30/2012   Hepatitis 2003   Hypertension    Myocardial infarction [facility])    Permanent atrial fibrillation, since 1994 09/30/2012   stress test 02/08/12- normal study, no significant ischemia   S/P AVR (aortic valve replacement), 09/30/2012   a. s/p mechcanical AVR in 09/2012 (on Coumadin)   Sleep apnea    uses CPAP   Past Surgical History:  Procedure Laterality Date   ABDOMINAL ANGIOGRAM  09/18/2012  Procedure: ABDOMINAL ANGIOGRAM;  Surgeon: Troy Sine, MD;  Location: [facility] CATH LAB;  Service: Cardiovascular;;   AORTIC VALVE REPLACEMENT  09/29/2012   Procedure: AORTIC VALVE REPLACEMENT (AVR);  Surgeon: Gaye Pollack, MD;  Location: [place];  Service: Open Heart Surgery;  Laterality: N/A;   ARCH AORTOGRAM  09/18/2012   Procedure: ARCH AORTOGRAM;  Surgeon: Troy Sine, MD;  Location: [facility] CATH LAB;  Service: Cardiovascular;;   CARDIAC CATHETERIZATION  09/18/12   severe calcific aortic stenosis, peak gradient 10mm, mean gradient 76mm, EF 45%   CARDIAC VALVE REPLACEMENT     AVR 09-29-12   CHOLECYSTECTOMY     FRACTURE SURGERY     LEFT AND RIGHT HEART CATHETERIZATION WITH CORONARY ANGIOGRAM N/A 09/18/2012   Procedure: LEFT AND RIGHT HEART CATHETERIZATION WITH CORONARY ANGIOGRAM;  Surgeon: Troy Sine, MD;  Location: [facility] CATH LAB;  Service: Cardiovascular;  Laterality: N/A;   TEE WITHOUT CARDIOVERSION N/A 05/29/2019   Procedure: TRANSESOPHAGEAL ECHOCARDIOGRAM (TEE);  Surgeon: Lelon Perla, MD;  Location: [place] ENDOSCOPY;  Service: Cardiovascular;  Laterality: N/A;     Current Meds  Medication Sig   acetaminophen (TYLENOL) 325 MG tablet Take 2 tablets (650 mg total) by  mouth every 4 (four) hours as needed for pain or fever.   albuterol (PROVENTIL HFA;VENTOLIN HFA) 108 (90 BASE) MCG/ACT inhaler Inhale 2 puffs into the lungs every 6 (six) hours as needed for wheezing or shortness of breath.    Ascorbic Acid (VITAMIN C) 1000 MG tablet Take 1,000 mg by mouth every evening.    B-D ULTRAFINE III SHORT PEN 31G X 8 MM MISC    cholecalciferol (VITAMIN D) 1000 UNITS tablet Take 1,000 Units by mouth daily.    digoxin (LANOXIN) 0.125 MG tablet TAKE 1 TABLET EVERY DAY (Patient taking differently: Take 0.125 mg by mouth at bedtime. )   EASY TOUCH LANCETS 30G/TWIST MISC    empagliflozin (JARDIANCE) 25 MG TABS tablet Take 25 mg by mouth daily.   escitalopram (LEXAPRO) 10 MG tablet Take 10 mg by mouth every evening.    fenofibrate 160 MG tablet Take 1 tablet (160 mg total) by mouth daily.   Fluticasone-Salmeterol (ADVAIR) 250-50 MCG/DOSE AEPB Inhale 1 puff into the lungs as needed (shortness of breath). For asthma related symptoms   glimepiride (AMARYL) 2 MG tablet Take 2 mg by mouth daily with breakfast.    Glucosamine-Chondroit-Vit C-Mn (GLUCOSAMINE 1500 COMPLEX PO) Take 1,500 mg by mouth every evening.    Icosapent Ethyl (VASCEPA) 1 g CAPS Take 2 capsules (2 g total) by mouth 2 (two) times daily.   insulin glargine (LANTUS) 100 UNIT/ML injection Inject 20-35 Units into the skin 2 (two) times daily. Depending on Blood sugar reading   metoprolol succinate (TOPROL XL) 25 MG 24 hr tablet Take 1.5 tablets (37.5 mg total) by mouth 2 (two) times a day.   Multiple Vitamins-Minerals (CENTRUM SPECIALIST HEART PO) Take 1 tablet by mouth daily.    nitroGLYCERIN (NITROSTAT) 0.4 MG SL tablet Place 1 tablet (0.4 mg total) under the tongue every 5 (five) minutes as needed for chest pain.   omega-3 acid ethyl esters (LOVAZA) 1 g capsule Take 1 g by mouth daily.   saccharomyces boulardii (FLORASTOR) 250 MG capsule Take 250 mg by mouth 3 (three) times a week.   torsemide  (DEMADEX) 20 MG tablet Take 1 tablet (20 mg total) by mouth 2 (two) times daily.   warfarin (COUMADIN) 10 MG tablet Take 10 mg by mouth See admin  instructions. Take on 10mg  on  Sun. Mon. Wed. Fri.   warfarin (COUMADIN) 5 MG tablet Take as instructed with 10 mg. (Patient taking differently: Take 2.5 mg by mouth See admin instructions. Take 1/2 tablet (2.5mg ) with 10 mg tablet to get a total of 12.5 on Tues, Thurs and Saturday)     Allergies:   Iohexol, Niacin and related, and Penicillins   Social History   Tobacco Use   Smoking status: Former Smoker    Packs/day: 0.25    Years: 10.00    Pack years: 2.50    Types: Cigarettes    Quit date: 09/25/1982    Years since quitting: 36.7   Smokeless tobacco: Former Systems developer  Substance Use Topics   Alcohol use: Yes    Comment: occ. beer/wine   Drug use: No     Family Hx: The patient's family history includes Diabetes in his father; Heart attack (age of onset: 23) in his brother; Hyperlipidemia (age of onset: 84) in his brother; Hypertension in his mother.  ROS:   Please see the history of present illness.    No fevers chills night sweats No cough Shortness of breath with activity No chest tightness Permanent atrial fibrillation Leg swelling OSA on CPAP  All other systems reviewed and are negative.   Prior CV studies:   The following studies were reviewed today:   TEE 05/29/2019 IMPRESSIONS  1. The left ventricle has moderate-severely reduced systolic function, with an ejection fraction of 30-35%. Left ventricular diffuse hypokinesis.  2. The right ventricle has severely reduced systolic function. The cavity was mildly enlarged.  3. Left atrial size was severely dilated.  4. Right atrial size was severely dilated.  5. The mitral valve is grossly normal. No evidence of mitral valve stenosis.  6. The tricuspid valve was grossly normal.  7. Aortic valve regurgitation is severe by color flow Doppler.  8. There is evidence of mild  plaque in the descending aorta.  9. Moderate to severe global reduction in LV systolic function; mild RVE with severe RV dysfunction; severe biatrial enlargement; s/p mechanical AVR with mean gradient of 14 mmHg (both leaflets appear to be mobile); severe perivalvular AI; mild MR and   Labs/Other Tests and Data Reviewed:    EKG:  An ECG dated 05/28/2019 was personally reviewed today and demonstrated:  Atrial fibrillation at 71 bpm with inferior and anterior Q waves  Recent Labs: 04/09/2019: Magnesium 2.6 05/27/2019: Hemoglobin 13.1; Platelets 204 05/28/2019: ALT 28; B Natriuretic Peptide 342.8; TSH 1.184 05/30/2019: BUN 45; Creatinine, Ser 1.81; Potassium 4.3; Sodium 138   Recent Lipid Panel Lab Results  Component Value Date/Time   CHOL 111 05/13/2019 04:22 AM   CHOL 156 02/05/2019 10:06 AM   CHOL 166 08/23/2014 08:53 AM   TRIG 89 05/13/2019 04:22 AM   TRIG 274 (H) 08/23/2014 08:53 AM   HDL 19 (L) 05/13/2019 04:22 AM   HDL 23 (L) 02/05/2019 10:06 AM   HDL 26 (L) 08/23/2014 08:53 AM   CHOLHDL 5.8 05/13/2019 04:22 AM   LDLCALC 74 05/13/2019 04:22 AM   LDLCALC 84 02/05/2019 10:06 AM   LDLCALC 85 08/23/2014 08:53 AM    Wt Readings from Last 3 Encounters:  06/03/19 254 lb (115.2 kg)  05/30/19 253 lb (114.8 kg)  05/15/19 253 lb 11.2 oz (115.1 kg)     Objective:    Vital Signs:  BP (!) 149/76    Pulse 83    Temp (!) 97.5 F (36.4 C)  Ht 5\' 11"  (1.803 m)    Wt 254 lb (115.2 kg)    BMI 35.43 kg/m    Well-developed and well-nourished in no acute distress Breathing was normal and not labored No audible wheezing Unable to auscultate since this was a video conference Heart rhythm was regular with his permanent atrial fibrillation, ventricular rate in the 80s Left leg edema according to the patient Grossly normal neurologically Normal affect and mood  ASSESSMENT & PLAN:    1. Acute on chronic biventricular systolic heart failure: Echo Doppler study in May 2020 showed an EF of 45 to  50%.  TEE on May 28, 2019 now reveals an EF of 30 -35%.  There is now evidence for severe RV dysfunction as well as severe aortic insufficiency.  Aortic valve prosthetic gradient 14 mm.  The patient has chronic kidney disease.  With creatinines ranging from 1.5-2.1.  At recent hospital discharge creatinine was 1.8.  He is on torsemide 20 mg twice a day, low-dose digoxin, as well as Jardiance with his diabetes.  I will now add low-dose nitrate/hydralazine and will start hydralazine at 12.5 mg twice a day and Imdur 30 mg. 2. Aortic stenosis: Status post [facility] Jude aortic valve replacement October 2003.  Now with evidence for severe perivalvular aortic leak which is new.  The patient was initially operated on by Dr. Arvid Right.  We will refer the patient back to Dr. Arvid Right over the next week for evaluation. 3. Permanent atrial fibrillation: Rate controlled with Toprol-XL 37.5 mg, he continues to be on digoxin, will need to check level. 4. Chronic kidney disease: Most recent creatinine at discharge 1.81.,  Stage IIIb 5. Mixed hyperlipidemia, currently on the Vascepa 2 capsules twice a day, Zetia 10 mg and fenofibrate.  Intolerant to statin therapy, may be candidate for future PCSK9 inhibition if cannot reach target LDL less than 70. 6. OSA on CPAP: We are trying to work with insurance to allow for supplemental oxygen to be bled into his CPAP unit 7. Type 2 diabetes mellitus: On insulin, glimepiride, Victoza, and Jardiance.  COVID-19 Education: The signs and symptoms of COVID-19 were discussed with the patient and how to seek care for testing (follow up with PCP or arrange E-visit).  The importance of social distancing was discussed today.  Time:   Today, I have spent 30 minutes with the patient with telehealth technology discussing the above problems.     Medication Adjustments/Labs and Tests Ordered: Current medicines are reviewed at length with the patient today.  Concerns regarding medicines  are outlined above.   Tests Ordered: Orders Placed This Encounter  Procedures   Basic metabolic panel   CBC   Brain natriuretic peptide   Ambulatory referral to Vascular Surgery    Medication Changes: Meds ordered this encounter  Medications   isosorbide mononitrate (IMDUR) 30 MG 24 hr tablet    Sig: Take 1 tablet (30 mg total) by mouth daily.    Dispense:  90 tablet    Refill:  3   hydrALAZINE (APRESOLINE) 25 MG tablet    Sig: Take 0.5 tablets (12.5 mg total) by mouth 2 (two) times a day.    Dispense:  270 tablet    Refill:  3    Follow Up: Follow-up BMet, BNP and CBC  Arrange for imminent cardiac surgical evaluation with Dr. Arvid Right and office visit follow-up in 2 to 3 weeks with me.   Signed, Shelva Majestic, MD  06/05/2019 3:42 PM  [facility]

## 2019-06-03 NOTE — Patient Instructions (Addendum)
Medication Instructions:  Start Imdur 30 mg daily Start Hydralazine take 12.5 mg twice daily.  If you need a refill on your cardiac medications before your next appointment, please call your pharmacy.   Lab work: BMET, BNP, CBC in 2-3 weeks. If you have labs (blood work) drawn today and your tests are completely normal, you will receive your results only by: [name] MyChart Message (if you have MyChart) OR . A paper copy in the mail If you have any lab test that is abnormal or we need to change your treatment, we will call you to review the results.  Follow-Up: At [facility], you and your health needs are our priority.  As part of our continuing mission to provide you with exceptional heart care, we have created designated Provider Care Teams.  These Care Teams include your primary Cardiologist (physician) and Advanced Practice Providers (APPs -  Physician Assistants and Nurse Practitioners) who all work together to provide you with the care you need, when you need it. You will need a follow up appointment in 2-3 weeks.  Please call our office 2 months in advance to schedule this appointment.  You may see Shelva Majestic, MD or one of the following Advanced Practice Providers on your designated Care Team: [place], [place] . Fabian Sharp, PA-C

## 2019-06-08 ENCOUNTER — Telehealth: Payer: Self-pay | Admitting: *Deleted

## 2019-06-08 NOTE — Telephone Encounter (Signed)
Left message for patient to call and schedule 3-4 week follow up appointment with Dr. Claiborne Billings

## 2019-06-09 ENCOUNTER — Telehealth: Payer: Self-pay | Admitting: Cardiovascular Disease

## 2019-06-09 DIAGNOSIS — I350 Nonrheumatic aortic (valve) stenosis: Secondary | ICD-10-CM

## 2019-06-09 NOTE — Telephone Encounter (Signed)
Aortic stenosis: Status post [facility] Jude aortic valve replacement October 2003.  Now with evidence for severe perivalvular aortic leak which is new.  The patient was initially operated on by Dr. Arvid Right.  We will refer the patient back to Dr. Arvid Right over the next week for evaluation.  amb ref entered for Cardiothoracic Surgery-Dr Cyndia Bent.

## 2019-06-09 NOTE — Telephone Encounter (Signed)
New Message    Patient states a referral was suppose to be sent to Dr. Vivi Martens office and would like the nurse to call them back!

## 2019-06-09 NOTE — Telephone Encounter (Signed)
Thank you, referral was placed by Veronda Prude, LPN to take over from the previous.

## 2019-06-10 ENCOUNTER — Telehealth: Payer: Self-pay | Admitting: Cardiovascular Disease

## 2019-06-10 ENCOUNTER — Other Ambulatory Visit: Payer: Self-pay | Admitting: *Deleted

## 2019-06-10 NOTE — Telephone Encounter (Signed)
Called Dr. Earlene Plater office regarding referral.  They have the referral in their work cut, but, no appointment has been made yet.

## 2019-06-10 NOTE — Patient Outreach (Signed)
[facility]) Care Management  06/10/2019  Joseph Hoffman 1951/07/14 [id number]   Telephone Assessment-HF Hospitalized 6/24-6/27 HF Primary provider to completed Transition of care (Dr. Damaris [facility])  RN spoke with pt and received update concerning his recent admission and plans. Pt states his has a leak to his valve and will be referred back to the sugergon Dr. [name] who initially performed the surgery. This appointment will take place next week. Pt states he may need OHS (open heart surgery) for this repair however not confirmed. Pt provided permission to speak with his spouse Joseph Hoffman). Caregiver spouse verified the above information and indicated pt's weights over the last week. States today 251 lbs, yesterday 252 and one week ago 254.7 lbs. Wife continue to report ongoing swelling to the left leg/ankle however much improved since pt's discharge.  Pt continue to wear his Home O2 at night with his CPAP device however continue to have some SOB with exertional activities. RN inquired on the [facility] packet of information mail however wife indicates she did not receive and requested this information once again. RN will requested [facility] office administrator to send [facility] packet, calendar along with printed emmi reports related to HF. This information was reviewed today and RN able to verify pt remains in the GREEN zone with no worsening of symptoms.  Plan of care review and discussed with goals and interventions adjusted accordingly based upon the pt's process however all have been extended due to pt's recent admission for HF (valve leak). Note pt's spouse confirmed she has spoke with the PA with [name] office this week since pt has been discharged.   Plan to update care plan and follow up in few weeks after consults for possible surgery via [name]. Will finish initial assessment and continue case management services accordingly.  THN CM Care Plan Problem One    Most Recent Value  Care Plan Problem One  Hospital prevention and knowledge deficit related to CHF exacerbation  Role Documenting the Problem One  Care Management Coordinator  Care Plan for Problem One  Active  THN Long Term Goal   Pt will not a hospitalization in the next 90 days with increase knowledge based on CHF.  THN Long Term Goal Start Date  05/22/19  Interventions for Problem One Long Term Goal  Will extend to allow ongoing intervention based upon new admission with new issues related to la valve leak. Will re-evaluate this gaol after pending intervention with [name] for possible surgery or procedures. Will strongly encouraged pt and caregiver for pt to contact his provider for any incresd symptoms or swelling for early interventions. Will verify pt remains in the GREEN with only the noted swelling to the left extremity that has improved.   THN CM Short Term Goal #1   Pt will weight daily and document all weights over the next 30 days.  THN CM Short Term Goal #1 Start Date  05/22/19  Interventions for Short Term Goal #1  Will extend to allow ongoing adherence based upon pt's recent admission and ongoing risk involving his CHF (valve leak) . Will continue to encouraged daily weights and monitoring related to pt's HF  THN CM Short Term Goal #2   Adherence with post-op medical appointments over the next 30 days.  THN CM Short Term Goal #2 Start Date  05/22/19  Interventions for Short Term Goal #2  Will extend to allow ongoing adherence based upon the recent hospitalization and pending upcoming appointments  that have been verified with pt today. Will strongly encourarged adherence with his attendance based upon his history of acute HF symptoms.       Raina Mina, RN Care Management Coordinator [place] Office [phone]

## 2019-06-12 ENCOUNTER — Encounter: Payer: Self-pay | Admitting: Surgery

## 2019-06-12 ENCOUNTER — Other Ambulatory Visit: Payer: Self-pay

## 2019-06-12 ENCOUNTER — Institutional Professional Consult (permissible substitution) (INDEPENDENT_AMBULATORY_CARE_PROVIDER_SITE_OTHER): Payer: Medicare Other | Admitting: Surgery

## 2019-06-12 VITALS — BP 150/77 | HR 80 | Temp 97.9°F | Resp 20 | Ht 71.0 in | Wt 254.0 lb

## 2019-06-12 DIAGNOSIS — I350 Nonrheumatic aortic (valve) stenosis: Secondary | ICD-10-CM | POA: Diagnosis not present

## 2019-06-12 NOTE — Progress Notes (Signed)
Cardiothoracic Surgery Consultation  PCP is Orpah Melter, MD Referring Provider is Troy Sine, MD  Chief Complaint  Patient presents with   Aortic Stenosis    Surgical eval, TEE 05/29/19, ECHO 04/10/19, HX of AVR 09/2012    HPI:  The patient is a 68 year old gentleman with a history of hypertension, diabetes, morbid obesity, chronic atrial fibrillation, OSA on CPAP, who underwent aortic valve replacement using a 23 mm St. Jude mechanical valve in 2013 by me.  He had an uneventful postop course and has done well over the years.  In February he said that he started noticing some exertional fatigue and shortness of breath which has been progressive.  A 2D echocardiogram at that time showed an ejection fraction of 40 to 45% with moderate aortic insufficiency and a decrease in the mean gradient across aortic valve prosthesis to 16 mmHg.Joseph Hoffman  His prior echocardiogram in May 2019 had only shown mild regurgitation with a mean gradient across the valve of 26 mmHg.  Ejection fraction at that time was 50 to 55%.  He presented to cardiology in May 2020 with increasing symptoms of heart failure with weight gain, orthopnea, and PND.  An echocardiogram on Apr 11, 2019 showed an ejection fraction of 45 to 50%.  The aortic insufficiency was only reported as mild with a mean gradient across aortic valve prosthesis of 15 mmHg.  Since then he has been admitted to the hospital multiple times with congestive heart failure symptoms.  A TEE on 05/29/2019 showed a further drop in his ejection fraction to 30 to 35% with mild right ventricular enlargement and severe RV dysfunction.  There is severe biatrial enlargement.  The mean gradient across aortic valve is 14 mmHg.  There appeared to be severe aortic insufficiency which was felt to be perivalvular with mild MR and TR.  He has been home since June 27.  He said that he continues to have tiredness and marked exertional fatigue.  He has exertional shortness of breath.  He  describes some left-sided chest pressure but is not always exertionally related.  He has had lower extremity edema particularly in the left leg.  He denies any increase in his abdominal girth or bloating.  He has been eating well.  He denies any dizziness or syncope.  He has had no fever or chills.  The patient is here today with his wife who has significant degenerative disease of her knees and needs a knee replacement.  He lives with his wife and son.  He has 3 children.  He was working full-time until April when he lost his job.  Past Medical History:  Diagnosis Date   Anxiety    Aortic stenosis    Arthritis    Asthma    Asymptomatic LV dysfunction, EF 45%, normal coronary arteries on cardiac cath 09/18/12 09/30/2012   CHF (congestive heart failure) (HCC)    Chronic anticoagulation, on Xarelto prior to admit 09/30/2012   DM (diabetes mellitus) ([place]) 09/30/2012   Hepatitis 2003   Hypertension    Myocardial infarction [facility])    Permanent atrial fibrillation, since 1994 09/30/2012   stress test 02/08/12- normal study, no significant ischemia   S/P AVR (aortic valve replacement), 09/30/2012   a. s/p mechcanical AVR in 09/2012 (on Coumadin)   Sleep apnea    uses CPAP    Past Surgical History:  Procedure Laterality Date   ABDOMINAL ANGIOGRAM  09/18/2012   Procedure: ABDOMINAL ANGIOGRAM;  Surgeon: Troy Sine, MD;  Location: [facility] CATH LAB;  Service: Cardiovascular;;   AORTIC VALVE REPLACEMENT  09/29/2012   Procedure: AORTIC VALVE REPLACEMENT (AVR);  Surgeon: Gaye Pollack, MD;  Location: [place];  Service: Open Heart Surgery;  Laterality: N/A;   ARCH AORTOGRAM  09/18/2012   Procedure: ARCH AORTOGRAM;  Surgeon: Troy Sine, MD;  Location: [facility] CATH LAB;  Service: Cardiovascular;;   CARDIAC CATHETERIZATION  09/18/12   severe calcific aortic stenosis, peak gradient 26mm, mean gradient 27mm, EF 45%   CARDIAC VALVE REPLACEMENT     AVR 09-29-12   CHOLECYSTECTOMY      FRACTURE SURGERY     LEFT AND RIGHT HEART CATHETERIZATION WITH CORONARY ANGIOGRAM N/A 09/18/2012   Procedure: LEFT AND RIGHT HEART CATHETERIZATION WITH CORONARY ANGIOGRAM;  Surgeon: Troy Sine, MD;  Location: [facility] CATH LAB;  Service: Cardiovascular;  Laterality: N/A;   TEE WITHOUT CARDIOVERSION N/A 05/29/2019   Procedure: TRANSESOPHAGEAL ECHOCARDIOGRAM (TEE);  Surgeon: Lelon Perla, MD;  Location: [facility] ENDOSCOPY;  Service: Cardiovascular;  Laterality: N/A;    Family History  Problem Relation Age of Onset   Hypertension Mother    Diabetes Father    Heart attack Brother 54   Hyperlipidemia Brother 25       stents placed    Social History Social History   Tobacco Use   Smoking status: Former Smoker    Packs/day: 0.25    Years: 10.00    Pack years: 2.50    Types: Cigarettes    Quit date: 09/25/1982    Years since quitting: 36.7   Smokeless tobacco: Former Systems developer  Substance Use Topics   Alcohol use: Yes    Comment: occ. beer/wine   Drug use: No    Current Outpatient Medications  Medication Sig Dispense Refill   acetaminophen (TYLENOL) 325 MG tablet Take 2 tablets (650 mg total) by mouth every 4 (four) hours as needed for pain or fever.     albuterol (PROVENTIL HFA;VENTOLIN HFA) 108 (90 BASE) MCG/ACT inhaler Inhale 2 puffs into the lungs every 6 (six) hours as needed for wheezing or shortness of breath.      Ascorbic Acid (VITAMIN C) 1000 MG tablet Take 1,000 mg by mouth every evening.      B-D ULTRAFINE III SHORT PEN 31G X 8 MM MISC      cholecalciferol (VITAMIN D) 1000 UNITS tablet Take 1,000 Units by mouth daily.      digoxin (LANOXIN) 0.125 MG tablet TAKE 1 TABLET EVERY DAY (Patient taking differently: Take 0.125 mg by mouth at bedtime. ) 90 tablet 1   EASY TOUCH LANCETS 30G/TWIST MISC      empagliflozin (JARDIANCE) 25 MG TABS tablet Take 25 mg by mouth daily. 90 tablet 1   escitalopram (LEXAPRO) 10 MG tablet Take 10 mg by mouth every evening.       ezetimibe (ZETIA) 10 MG tablet Take 1 tablet (10 mg total) by mouth daily. (Patient taking differently: Take 10 mg by mouth at bedtime. Refilled and taking as prescribed) 90 tablet 3   fenofibrate 160 MG tablet Take 1 tablet (160 mg total) by mouth daily. 30 tablet 1   Fluticasone-Salmeterol (ADVAIR) 250-50 MCG/DOSE AEPB Inhale 1 puff into the lungs as needed (shortness of breath). For asthma related symptoms     glimepiride (AMARYL) 2 MG tablet Take 2 mg by mouth daily with breakfast.      Glucosamine-Chondroit-Vit C-Mn (GLUCOSAMINE 1500 COMPLEX PO) Take 1,500 mg by mouth every evening.      hydrALAZINE (  APRESOLINE) 25 MG tablet Take 0.5 tablets (12.5 mg total) by mouth 2 (two) times a day. 270 tablet 3   Icosapent Ethyl (VASCEPA) 1 g CAPS Take 2 capsules (2 g total) by mouth 2 (two) times daily. 360 capsule 3   insulin glargine (LANTUS) 100 UNIT/ML injection Inject 20-35 Units into the skin 2 (two) times daily. Depending on Blood sugar reading     isosorbide mononitrate (IMDUR) 30 MG 24 hr tablet Take 1 tablet (30 mg total) by mouth daily. 90 tablet 3   metoprolol succinate (TOPROL XL) 25 MG 24 hr tablet Take 1.5 tablets (37.5 mg total) by mouth 2 (two) times a day. 60 tablet 1   Multiple Vitamins-Minerals (CENTRUM SPECIALIST HEART PO) Take 1 tablet by mouth daily.      nitroGLYCERIN (NITROSTAT) 0.4 MG SL tablet Place 1 tablet (0.4 mg total) under the tongue every 5 (five) minutes as needed for chest pain. 30 tablet 2   saccharomyces boulardii (FLORASTOR) 250 MG capsule Take 250 mg by mouth 3 (three) times a week.     torsemide (DEMADEX) 20 MG tablet Take 1 tablet (20 mg total) by mouth 2 (two) times daily. 30 tablet 2   warfarin (COUMADIN) 10 MG tablet Take 10 mg by mouth See admin instructions. Take on 10mg  on  Sun. Mon. Wed. Fri.     warfarin (COUMADIN) 5 MG tablet Take as instructed with 10 mg. (Patient taking differently: Take 2.5 mg by mouth See admin instructions. Take 1/2  tablet (2.5mg ) with 10 mg tablet to get a total of 12.5 on Tues, Thurs and Saturday) 90 tablet 1   No current facility-administered medications for this visit.     Allergies  Allergen Reactions   Iohexol Anaphylaxis   Niacin And Related     Flushing with immediate realese   Penicillins Other (See Comments)    Unknown.Joseph Kitchenaortic stenosis a child  Did it involve swelling of the face/tongue/throat, SOB, or low BP? Unknown Did it involve sudden or severe rash/hives, skin peeling, or any reaction on the inside of your mouth or nose? Unknown Did you need to seek medical attention at a hospital or doctor's office? Unknown When did it last happen?Childhood If all above answers are NO, may proceed with cephalosporin use.    Review of Systems  Constitutional: Positive for activity change and fatigue. Negative for appetite change, chills, diaphoresis and fever.  HENT: Negative.   Eyes: Negative.   Respiratory: Positive for chest tightness and shortness of breath.        Uses oxygen at night and is on CPAP for sleep apnea  Cardiovascular: Positive for chest pain, palpitations and leg swelling.  Gastrointestinal: Negative.   Endocrine: Negative.   Genitourinary: Negative.   Musculoskeletal: Positive for arthralgias and gait problem.  Allergic/Immunologic: Negative.   Neurological:       Peripheral neuropathy and some memory disturbance  Hematological: Negative.   Psychiatric/Behavioral:       Depression    BP (!) 150/77    Pulse 80    Temp 97.9 F (36.6 C) (Skin)    Resp 20    Ht 5\' 11"  (1.803 m)    Wt 254 lb (115.2 kg)    SpO2 90% Comment: RA   BMI 35.43 kg/m  Physical Exam Constitutional:      Appearance: Normal appearance. He is obese.  HENT:     Head: Normocephalic and atraumatic.  Eyes:     Extraocular Movements: Extraocular movements intact.  Conjunctiva/sclera: Conjunctivae normal.     Pupils: Pupils are equal, round, and reactive to light.  Neck:      Musculoskeletal: Normal range of motion and neck supple.  Cardiovascular:     Rate and Rhythm: Normal rate. Rhythm irregular.     Heart sounds: Murmur present.     Comments: 3/6 systolic murmur along the right sternal border, 3/6 diastolic murmur at the apex. Pulmonary:     Effort: Pulmonary effort is normal.     Breath sounds: Normal breath sounds.  Abdominal:     General: Bowel sounds are normal.     Palpations: Abdomen is soft.  Musculoskeletal: Normal range of motion.        General: Swelling present.  Skin:    General: Skin is dry.  Neurological:     General: No focal deficit present.     Mental Status: He is alert and oriented to person, place, and time.  Psychiatric:        Mood and Affect: Mood normal.        Behavior: Behavior normal.      Diagnostic Tests:   TRANSESOPHOGEAL ECHO REPORT       Patient Name:   Joseph Hoffman Date of Exam: 05/29/2019 Medical Rec #:  [id number]      Height:       71.0 in Accession #:    [id number]     Weight:       256.4 lb Date of Birth:  07-May-1951      BSA:          2.34 m Patient Age:    6 years       BP:           135/58 mmHg Patient Gender: M              HR:           68 bpm. Exam Location:  Inpatient    Procedure: Transesophageal Echo  Indications:    Aortic valve disorder 424.1 / I35.9   History:        Patient has prior history of Echocardiogram examinations, most                 recent 04/10/2019. Nonischemic cardiomyopathy and CHF S/P AVR,                 09/29/12, St. Jude Atrial Fibrillation Risk Factors:                 Hypertension, Diabetes and Dyslipidemia. Chronic kidney disease                 Chronic anticoagulation                 Elevated troponin.   Sonographer:    Talmage Coin Referring Phys: [id number] Sherlyn Hay HAMMOND     PROCEDURE: The transesophogeal probe was passed through the esophogus of the patient. The patient developed no complications during the procedure.  IMPRESSIONS    1. The  left ventricle has moderate-severely reduced systolic function, with an ejection fraction of 30-35%. Left ventricular diffuse hypokinesis.  2. The right ventricle has severely reduced systolic function. The cavity was mildly enlarged.  3. Left atrial size was severely dilated.  4. Right atrial size was severely dilated.  5. The mitral valve is grossly normal. No evidence of mitral valve stenosis.  6. The tricuspid valve was grossly normal.  7. Aortic valve regurgitation is severe by color flow Doppler.  8. There is evidence of mild plaque in the descending aorta.  9. Moderate to severe global reduction in LV systolic function; mild RVE with severe RV dysfunction; severe biatrial enlargement; s/p mechanical AVR with mean gradient of 14 mmHg (both leaflets appear to be mobile); severe perivalvular AI; mild MR and  TR.  FINDINGS  Left Ventricle: The left ventricle has moderate-severely reduced systolic function, with an ejection fraction of 30-35%. Left ventricular diffuse hypokinesis.  Right Ventricle: The right ventricle has severely reduced systolic function. The cavity was mildly enlarged.  Left Atrium: Left atrial size was severely dilated. There is echo contrast seen in the left atrial cavity.   Right Atrium: Right atrial size was severely dilated. Right atrial pressure is estimated at 10 mmHg.  Interatrial Septum: No atrial level shunt detected by color flow Doppler.  Pericardium: There is no evidence of pericardial effusion.  Mitral Valve: The mitral valve is grossly normal. Mitral valve regurgitation is mild by color flow Doppler. No evidence of mitral valve stenosis.  Tricuspid Valve: The tricuspid valve was grossly normal. Tricuspid valve regurgitation is mild by color flow Doppler.  Aortic Valve: The aortic valve has been repaired/replaced Aortic valve regurgitation is severe by color flow Doppler.  Pulmonic Valve: The pulmonic valve was grossly normal. Pulmonic  valve regurgitation is not visualized by color flow Doppler.  Aorta: There is evidence of mild plaque in the descending aorta.  Additional Findings: Moderate to severe global reduction in LV systolic function; mild RVE with severe RV dysfunction; severe biatrial enlargement; s/p mechanical AVR with mean gradient of 14 mmHg (both leaflets appear to be mobile); severe perivalvular  AI; mild MR and TR.    +-------------+------------++  AORTIC VALVE                 +-------------+------------++  AV Vmax:      248.33 cm/s    +-------------+------------++  AV Vmean:     176.000 cm/s   +-------------+------------++  AV VTI:       0.506 m        +-------------+------------++  AV Peak Grad: 24.7 mmHg      +-------------+------------++  AV Mean Grad: 14.0 mmHg      +-------------+------------++  +--------------+----------++  MITRAL VALVE                +--------------+----------++  MV Area (PHT): 3.61 cm     +--------------+----------++  MV Peak grad:  10.6 mmHg    +--------------+----------++  MV Mean grad:  2.5 mmHg     +--------------+----------++  MV Vmax:       1.63 m/s     +--------------+----------++  MV Vmean:      70.1 cm/s    +--------------+----------++  MV VTI:        0.22 m       +--------------+----------++  MV PHT:        61.00 msec   +--------------+----------++    Kirk Ruths MD Electronically signed by Kirk Ruths MD Signature Date/Time: 05/29/2019/4:35:57 PM     Impression:  This 68 year old gentleman has New York Heart Association class III symptoms of exertional fatigue, shortness of breath, orthopnea, and peripheral edema consistent with chronic combined systolic and diastolic biventricular heart failure.  TEE shows severe prosthetic aortic insufficiency which is felt to be perivalvular.  This is been in the mild-to-moderate range on previous 2D echocardiograms dating back to February.  He has had progressive deterioration in his left  ventricular and right ventricular systolic function that may be  due to his valvular insufficiency.  He has not had a cardiac catheterization yet to rule out coronary disease and certainly has risk factors for coronary disease but had normal coronary arteries in 2013.  It is not clear why he has developed a progressive prosthetic insufficiency.  The valve leaflets appear to be moving on echocardiogram.  He has not had any signs of infection to suggest development of endocarditis.  It is possible that he had significant perivalvular insufficiency for some time that was just not seen on 2D echocardiogram.  He said that he was feeling well until February when this all started.  I think the only option for treatment is replacement of his aortic valve prosthesis.  I would recommend using a bioprosthetic valve so that he does not absolutely have to be on Coumadin for the valve.  He would be at high risk for redo AVR due to his morbid obesity, severe biventricular heart failure with severe RV systolic dysfunction, stage III chronic kidney disease with a creatinine of 1.8, and diabetes.  Unfortunate I do not see any other options for treatment.  I think he will need to be admitted to get off Coumadin and on heparin prior to right and left heart catheterization.  Then I would recommend evaluation by the advanced heart failure team to maximize his hemodynamics while in the hospital on heparin awaiting surgery.  I will give him at least for 5 days after catheterization before proceeding with redo AVR to minimize the effect on his kidneys.  This will allow time for optimization of his hemodynamics by the heart failure team.  It is not clear to me why he has severe RV dysfunction unless he has severe pulmonary hypertension related to the aortic insufficiency.  He only has very mild tricuspid regurgitation.  I reviewed the echocardiogram images with the patient and his wife and answered their questions.  I explained my approach to  further work-up and treatment and they understand and agree with that plan.  Plan:  I will discuss having a right and left heart catheterization done with Dr. Claiborne Billings.  The patient will have to stop his Coumadin and be on heparin precatheterization due to his mechanical valve and atrial fibrillation.  He will need to stay off of Coumadin and on heparin after the catheterization with planned surgery while he is hospitalized.  I spent 60 minutes performing this consultation and > 50% of this time was spent face to face counseling and coordinating the care of this patient's severe prosthetic aortic valve insufficiency.   Gaye Pollack, MD [facility] [phone]

## 2019-06-17 ENCOUNTER — Telehealth: Payer: Self-pay | Admitting: Cardiovascular Disease

## 2019-06-17 NOTE — Telephone Encounter (Signed)
Routed to primary nurse 

## 2019-06-17 NOTE — Telephone Encounter (Signed)
New Message     Patient's wife states husband saw [name] 06/12/19 and he stated patient will have a cardiac cath and heart surgery performed by Dr. Claiborne Billings.  They have been waiting for the call with the details.  Please call patient's wife back.

## 2019-06-17 NOTE — Telephone Encounter (Signed)
Called wife back- advised that we are in the process of getting it set up to also fit with Dr.Bartle's schedule as well as Dr.Kelly's cath schedule- will speak with [facility] tomorrow afternoon to see if he has a date that would work and will reach out to [place] for confirmation.  Wife appreciative of call.

## 2019-06-19 ENCOUNTER — Telehealth: Payer: Self-pay | Admitting: Cardiovascular Disease

## 2019-06-19 ENCOUNTER — Other Ambulatory Visit: Payer: Self-pay

## 2019-06-19 DIAGNOSIS — Z01810 Encounter for preprocedural cardiovascular examination: Secondary | ICD-10-CM

## 2019-06-19 NOTE — Telephone Encounter (Signed)
Called [facility] and they have reached out to the patient and they are waiting for the patient to call back to schedule the appointment.

## 2019-06-24 ENCOUNTER — Telehealth: Payer: Medicare Other | Admitting: Cardiovascular Disease

## 2019-06-25 ENCOUNTER — Encounter: Payer: Medicare Other | Admitting: Surgery

## 2019-06-26 ENCOUNTER — Inpatient Hospital Stay (HOSPITAL_COMMUNITY)
Admission: EM | Admit: 2019-06-26 | Discharge: 2019-07-10 | DRG: 853 | Disposition: A | Discharge status: Home / Self Care | Payer: Medicare Other | Attending: Internal Medicine | Admitting: Internal Medicine

## 2019-06-26 ENCOUNTER — Emergency Department (HOSPITAL_COMMUNITY): Payer: Medicare Other

## 2019-06-26 ENCOUNTER — Inpatient Hospital Stay (HOSPITAL_COMMUNITY): Payer: Medicare Other

## 2019-06-26 ENCOUNTER — Other Ambulatory Visit: Payer: Self-pay

## 2019-06-26 ENCOUNTER — Encounter (HOSPITAL_COMMUNITY): Payer: Self-pay

## 2019-06-26 ENCOUNTER — Telehealth: Payer: Self-pay | Admitting: Cardiovascular Disease

## 2019-06-26 DIAGNOSIS — N183 Chronic kidney disease, stage 3 unspecified: Secondary | ICD-10-CM | POA: Diagnosis present

## 2019-06-26 DIAGNOSIS — L03039 Cellulitis of unspecified toe: Secondary | ICD-10-CM | POA: Diagnosis present

## 2019-06-26 DIAGNOSIS — I5023 Acute on chronic systolic (congestive) heart failure: Secondary | ICD-10-CM | POA: Diagnosis not present

## 2019-06-26 DIAGNOSIS — R079 Chest pain, unspecified: Secondary | ICD-10-CM | POA: Diagnosis not present

## 2019-06-26 DIAGNOSIS — I5041 Acute combined systolic (congestive) and diastolic (congestive) heart failure: Secondary | ICD-10-CM | POA: Diagnosis present

## 2019-06-26 DIAGNOSIS — Z89412 Acquired absence of left great toe: Secondary | ICD-10-CM | POA: Diagnosis not present

## 2019-06-26 DIAGNOSIS — E1152 Type 2 diabetes mellitus with diabetic peripheral angiopathy with gangrene: Secondary | ICD-10-CM | POA: Diagnosis present

## 2019-06-26 DIAGNOSIS — Z20828 Contact with and (suspected) exposure to other viral communicable diseases: Secondary | ICD-10-CM | POA: Diagnosis present

## 2019-06-26 DIAGNOSIS — A419 Sepsis, unspecified organism: Principal | ICD-10-CM | POA: Diagnosis present

## 2019-06-26 DIAGNOSIS — Z87891 Personal history of nicotine dependence: Secondary | ICD-10-CM

## 2019-06-26 DIAGNOSIS — F418 Other specified anxiety disorders: Secondary | ICD-10-CM | POA: Diagnosis present

## 2019-06-26 DIAGNOSIS — I352 Nonrheumatic aortic (valve) stenosis with insufficiency: Secondary | ICD-10-CM | POA: Diagnosis present

## 2019-06-26 DIAGNOSIS — I350 Nonrheumatic aortic (valve) stenosis: Secondary | ICD-10-CM | POA: Diagnosis not present

## 2019-06-26 DIAGNOSIS — M1990 Unspecified osteoarthritis, unspecified site: Secondary | ICD-10-CM | POA: Diagnosis present

## 2019-06-26 DIAGNOSIS — I359 Nonrheumatic aortic valve disorder, unspecified: Secondary | ICD-10-CM

## 2019-06-26 DIAGNOSIS — I5082 Biventricular heart failure: Secondary | ICD-10-CM | POA: Diagnosis present

## 2019-06-26 DIAGNOSIS — G4733 Obstructive sleep apnea (adult) (pediatric): Secondary | ICD-10-CM | POA: Diagnosis not present

## 2019-06-26 DIAGNOSIS — E871 Hypo-osmolality and hyponatremia: Secondary | ICD-10-CM | POA: Diagnosis not present

## 2019-06-26 DIAGNOSIS — I4821 Permanent atrial fibrillation: Secondary | ICD-10-CM | POA: Diagnosis present

## 2019-06-26 DIAGNOSIS — Z952 Presence of prosthetic heart valve: Secondary | ICD-10-CM

## 2019-06-26 DIAGNOSIS — Z951 Presence of aortocoronary bypass graft: Secondary | ICD-10-CM

## 2019-06-26 DIAGNOSIS — E11621 Type 2 diabetes mellitus with foot ulcer: Secondary | ICD-10-CM | POA: Diagnosis present

## 2019-06-26 DIAGNOSIS — M19072 Primary osteoarthritis, left ankle and foot: Secondary | ICD-10-CM | POA: Diagnosis not present

## 2019-06-26 DIAGNOSIS — F329 Major depressive disorder, single episode, unspecified: Secondary | ICD-10-CM | POA: Diagnosis present

## 2019-06-26 DIAGNOSIS — F32A Depression, unspecified: Secondary | ICD-10-CM | POA: Diagnosis present

## 2019-06-26 DIAGNOSIS — Z888 Allergy status to other drugs, medicaments and biological substances status: Secondary | ICD-10-CM

## 2019-06-26 DIAGNOSIS — I739 Peripheral vascular disease, unspecified: Secondary | ICD-10-CM | POA: Diagnosis not present

## 2019-06-26 DIAGNOSIS — L97529 Non-pressure chronic ulcer of other part of left foot with unspecified severity: Secondary | ICD-10-CM | POA: Diagnosis not present

## 2019-06-26 DIAGNOSIS — J45909 Unspecified asthma, uncomplicated: Secondary | ICD-10-CM | POA: Diagnosis present

## 2019-06-26 DIAGNOSIS — I444 Left anterior fascicular block: Secondary | ICD-10-CM | POA: Diagnosis present

## 2019-06-26 DIAGNOSIS — Z880 Allergy status to penicillin: Secondary | ICD-10-CM | POA: Diagnosis not present

## 2019-06-26 DIAGNOSIS — I70245 Atherosclerosis of native arteries of left leg with ulceration of other part of foot: Secondary | ICD-10-CM | POA: Diagnosis not present

## 2019-06-26 DIAGNOSIS — M869 Osteomyelitis, unspecified: Secondary | ICD-10-CM

## 2019-06-26 DIAGNOSIS — Z91041 Radiographic dye allergy status: Secondary | ICD-10-CM | POA: Diagnosis not present

## 2019-06-26 DIAGNOSIS — I10 Essential (primary) hypertension: Secondary | ICD-10-CM | POA: Diagnosis not present

## 2019-06-26 DIAGNOSIS — L03032 Cellulitis of left toe: Secondary | ICD-10-CM | POA: Diagnosis not present

## 2019-06-26 DIAGNOSIS — I252 Old myocardial infarction: Secondary | ICD-10-CM

## 2019-06-26 DIAGNOSIS — E782 Mixed hyperlipidemia: Secondary | ICD-10-CM | POA: Diagnosis present

## 2019-06-26 DIAGNOSIS — R0602 Shortness of breath: Secondary | ICD-10-CM

## 2019-06-26 DIAGNOSIS — I130 Hypertensive heart and chronic kidney disease with heart failure and stage 1 through stage 4 chronic kidney disease, or unspecified chronic kidney disease: Secondary | ICD-10-CM | POA: Diagnosis present

## 2019-06-26 DIAGNOSIS — N179 Acute kidney failure, unspecified: Secondary | ICD-10-CM | POA: Diagnosis present

## 2019-06-26 DIAGNOSIS — E1122 Type 2 diabetes mellitus with diabetic chronic kidney disease: Secondary | ICD-10-CM | POA: Diagnosis present

## 2019-06-26 DIAGNOSIS — Z9989 Dependence on other enabling machines and devices: Secondary | ICD-10-CM | POA: Diagnosis not present

## 2019-06-26 DIAGNOSIS — I4891 Unspecified atrial fibrillation: Secondary | ICD-10-CM | POA: Diagnosis not present

## 2019-06-26 DIAGNOSIS — R0789 Other chest pain: Secondary | ICD-10-CM | POA: Diagnosis not present

## 2019-06-26 DIAGNOSIS — Z7901 Long term (current) use of anticoagulants: Secondary | ICD-10-CM

## 2019-06-26 DIAGNOSIS — I5043 Acute on chronic combined systolic (congestive) and diastolic (congestive) heart failure: Secondary | ICD-10-CM | POA: Diagnosis present

## 2019-06-26 DIAGNOSIS — I493 Ventricular premature depolarization: Secondary | ICD-10-CM | POA: Diagnosis present

## 2019-06-26 DIAGNOSIS — R072 Precordial pain: Secondary | ICD-10-CM | POA: Diagnosis not present

## 2019-06-26 DIAGNOSIS — I4510 Unspecified right bundle-branch block: Secondary | ICD-10-CM | POA: Diagnosis present

## 2019-06-26 DIAGNOSIS — K759 Inflammatory liver disease, unspecified: Secondary | ICD-10-CM | POA: Diagnosis present

## 2019-06-26 DIAGNOSIS — Z79899 Other long term (current) drug therapy: Secondary | ICD-10-CM

## 2019-06-26 DIAGNOSIS — I2510 Atherosclerotic heart disease of native coronary artery without angina pectoris: Secondary | ICD-10-CM | POA: Diagnosis present

## 2019-06-26 DIAGNOSIS — Z8249 Family history of ischemic heart disease and other diseases of the circulatory system: Secondary | ICD-10-CM

## 2019-06-26 DIAGNOSIS — L03116 Cellulitis of left lower limb: Secondary | ICD-10-CM | POA: Diagnosis present

## 2019-06-26 DIAGNOSIS — E785 Hyperlipidemia, unspecified: Secondary | ICD-10-CM | POA: Diagnosis not present

## 2019-06-26 DIAGNOSIS — I96 Gangrene, not elsewhere classified: Secondary | ICD-10-CM | POA: Diagnosis not present

## 2019-06-26 DIAGNOSIS — J9621 Acute and chronic respiratory failure with hypoxia: Secondary | ICD-10-CM | POA: Diagnosis present

## 2019-06-26 DIAGNOSIS — R7989 Other specified abnormal findings of blood chemistry: Secondary | ICD-10-CM

## 2019-06-26 DIAGNOSIS — Z6834 Body mass index (BMI) 34.0-34.9, adult: Secondary | ICD-10-CM

## 2019-06-26 DIAGNOSIS — Z7951 Long term (current) use of inhaled steroids: Secondary | ICD-10-CM

## 2019-06-26 DIAGNOSIS — L0390 Cellulitis, unspecified: Secondary | ICD-10-CM

## 2019-06-26 DIAGNOSIS — Z01810 Encounter for preprocedural cardiovascular examination: Secondary | ICD-10-CM | POA: Diagnosis not present

## 2019-06-26 DIAGNOSIS — I351 Nonrheumatic aortic (valve) insufficiency: Secondary | ICD-10-CM | POA: Diagnosis not present

## 2019-06-26 DIAGNOSIS — Z833 Family history of diabetes mellitus: Secondary | ICD-10-CM

## 2019-06-26 DIAGNOSIS — Z794 Long term (current) use of insulin: Secondary | ICD-10-CM

## 2019-06-26 LAB — BASIC METABOLIC PANEL
Anion gap: 15 (ref 5–15)
BUN: 46 mg/dL — ABNORMAL HIGH (ref 8–23)
CO2: 21 mmol/L — ABNORMAL LOW (ref 22–32)
Calcium: 9.7 mg/dL (ref 8.9–10.3)
Chloride: 98 mmol/L (ref 98–111)
Creatinine, Ser: 2.12 mg/dL — ABNORMAL HIGH (ref 0.61–1.24)
GFR calc Af Amer: 35 mL/min — ABNORMAL LOW (ref >60)
GFR calc non Af Amer: 31 mL/min — ABNORMAL LOW (ref >60)
Glucose, Bld: 227 mg/dL — ABNORMAL HIGH (ref 70–99)
Potassium: 4.5 mmol/L (ref 3.5–5.1)
Sodium: 134 mmol/L — ABNORMAL LOW (ref 135–145)

## 2019-06-26 LAB — GLUCOSE, CAPILLARY
Glucose-Capillary: 149 mg/dL — ABNORMAL HIGH (ref 70–99)
Glucose-Capillary: 277 mg/dL — ABNORMAL HIGH (ref 70–99)

## 2019-06-26 LAB — CBC
HCT: 41.1 % (ref 39.0–52.0)
Hemoglobin: 13 g/dL (ref 13.0–17.0)
MCH: 25.9 pg — ABNORMAL LOW (ref 26.0–34.0)
MCHC: 31.6 g/dL (ref 30.0–36.0)
MCV: 81.9 fL (ref 80.0–100.0)
Platelets: 240 10*3/uL (ref 150–400)
RBC: 5.02 MIL/uL (ref 4.22–5.81)
RDW: 17.2 % — ABNORMAL HIGH (ref 11.5–15.5)
WBC: 14.9 10*3/uL — ABNORMAL HIGH (ref 4.0–10.5)
nRBC: 0 % (ref 0.0–0.2)

## 2019-06-26 LAB — TROPONIN I (HIGH SENSITIVITY)
Troponin I (High Sensitivity): 96 ng/L — ABNORMAL HIGH (ref <18)
Troponin I (High Sensitivity): 99 ng/L — ABNORMAL HIGH (ref <18)

## 2019-06-26 LAB — CBG MONITORING, ED: Glucose-Capillary: 219 mg/dL — ABNORMAL HIGH (ref 70–99)

## 2019-06-26 LAB — LACTIC ACID, PLASMA
Lactic Acid, Venous: 1.3 mmol/L (ref 0.5–1.9)
Lactic Acid, Venous: 1.4 mmol/L (ref 0.5–1.9)

## 2019-06-26 LAB — SARS CORONAVIRUS 2 BY RT PCR (HOSPITAL ORDER, PERFORMED IN ~~LOC~~ HOSPITAL LAB): SARS Coronavirus 2: NEGATIVE

## 2019-06-26 LAB — BRAIN NATRIURETIC PEPTIDE: B Natriuretic Peptide: 669.2 pg/mL — ABNORMAL HIGH (ref 0.0–100.0)

## 2019-06-26 LAB — PROTIME-INR
INR: 3.9 — ABNORMAL HIGH (ref 0.8–1.2)
Prothrombin Time: 37.3 seconds — ABNORMAL HIGH (ref 11.4–15.2)

## 2019-06-26 LAB — TYPE AND SCREEN
ABO/RH(D): A POS
Antibody Screen: NEGATIVE

## 2019-06-26 LAB — DIGOXIN LEVEL: Digoxin Level: 0.3 ng/mL — ABNORMAL LOW (ref 0.8–2.0)

## 2019-06-26 MED ORDER — SODIUM CHLORIDE 0.9 % IV SOLN: 2.0000 g | Freq: Once | INTRAVENOUS | Status: DC

## 2019-06-26 MED ORDER — VANCOMYCIN HCL 10 G IV SOLR
2500.0000 mg | Freq: Once | INTRAVENOUS | Status: AC
  Administered 2019-06-26: 2500 mg via INTRAVENOUS
  Filled 2019-06-26: qty 2500

## 2019-06-26 MED ORDER — FENOFIBRATE 160 MG PO TABS
160.0000 mg | ORAL_TABLET | Freq: Every day | ORAL | Status: DC
  Administered 2019-06-27 – 2019-07-10 (×13): 160 mg via ORAL
  Filled 2019-06-26 (×15): qty 1

## 2019-06-26 MED ORDER — NITROGLYCERIN 0.4 MG SL SUBL: 0.4000 mg | SUBLINGUAL_TABLET | SUBLINGUAL | Status: DC | PRN

## 2019-06-26 MED ORDER — FUROSEMIDE 10 MG/ML IJ SOLN: 80.0000 mg | Freq: Once | INTRAMUSCULAR | Status: DC

## 2019-06-26 MED ORDER — ALBUTEROL SULFATE HFA 108 (90 BASE) MCG/ACT IN AERS: 2.0000 | INHALATION_SPRAY | Freq: Four times a day (QID) | RESPIRATORY_TRACT | Status: DC | PRN

## 2019-06-26 MED ORDER — METRONIDAZOLE IN NACL 5-0.79 MG/ML-% IV SOLN: 500.0000 mg | Freq: Three times a day (TID) | INTRAVENOUS | Status: DC

## 2019-06-26 MED ORDER — ISOSORBIDE MONONITRATE ER 30 MG PO TB24
30.0000 mg | ORAL_TABLET | Freq: Every day | ORAL | Status: DC
  Administered 2019-06-27 – 2019-07-06 (×9): 30 mg via ORAL
  Filled 2019-06-26 (×10): qty 1

## 2019-06-26 MED ORDER — MOMETASONE FURO-FORMOTEROL FUM 200-5 MCG/ACT IN AERO
2.0000 | INHALATION_SPRAY | Freq: Two times a day (BID) | RESPIRATORY_TRACT | Status: DC
  Administered 2019-06-27 – 2019-07-10 (×24): 2 via RESPIRATORY_TRACT
  Filled 2019-06-26: qty 8.8

## 2019-06-26 MED ORDER — SACCHAROMYCES BOULARDII 250 MG PO CAPS
250.0000 mg | ORAL_CAPSULE | ORAL | Status: DC
  Administered 2019-06-26 – 2019-07-08 (×5): 250 mg via ORAL
  Filled 2019-06-26 (×6): qty 1

## 2019-06-26 MED ORDER — VANCOMYCIN HCL IN DEXTROSE 1-5 GM/200ML-% IV SOLN
1000.0000 mg | INTRAVENOUS | Status: DC
  Administered 2019-06-27 – 2019-06-30 (×4): 1000 mg via INTRAVENOUS
  Filled 2019-06-26 (×5): qty 200

## 2019-06-26 MED ORDER — SODIUM CHLORIDE 0.9 % IV SOLN
2.0000 g | INTRAVENOUS | Status: DC
  Administered 2019-06-26 – 2019-07-03 (×8): 2 g via INTRAVENOUS
  Filled 2019-06-26 (×2): qty 2
  Filled 2019-06-26 (×7): qty 20

## 2019-06-26 MED ORDER — ACETAMINOPHEN 325 MG PO TABS
650.0000 mg | ORAL_TABLET | Freq: Four times a day (QID) | ORAL | Status: DC | PRN
  Administered 2019-06-27: 650 mg via ORAL

## 2019-06-26 MED ORDER — OMEGA-3-ACID ETHYL ESTERS 1 G PO CAPS
2.0000 g | ORAL_CAPSULE | Freq: Two times a day (BID) | ORAL | Status: DC
  Administered 2019-06-26: 2 g via ORAL
  Filled 2019-06-26 (×2): qty 2

## 2019-06-26 MED ORDER — METOPROLOL SUCCINATE ER 25 MG PO TB24
37.5000 mg | ORAL_TABLET | Freq: Two times a day (BID) | ORAL | Status: DC
  Administered 2019-06-26 – 2019-07-01 (×10): 37.5 mg via ORAL
  Filled 2019-06-26 (×10): qty 2

## 2019-06-26 MED ORDER — METRONIDAZOLE IN NACL 5-0.79 MG/ML-% IV SOLN
500.0000 mg | Freq: Three times a day (TID) | INTRAVENOUS | Status: DC
  Administered 2019-06-26 – 2019-07-01 (×15): 500 mg via INTRAVENOUS
  Filled 2019-06-26 (×15): qty 100

## 2019-06-26 MED ORDER — FUROSEMIDE 10 MG/ML IJ SOLN
80.0000 mg | Freq: Two times a day (BID) | INTRAMUSCULAR | Status: AC
  Administered 2019-06-26 – 2019-06-27 (×2): 80 mg via INTRAVENOUS
  Filled 2019-06-26 (×2): qty 8

## 2019-06-26 MED ORDER — ACETAMINOPHEN 650 MG RE SUPP: 650.0000 mg | Freq: Four times a day (QID) | RECTAL | Status: DC | PRN

## 2019-06-26 MED ORDER — INSULIN GLARGINE 100 UNIT/ML ~~LOC~~ SOLN
25.0000 [IU] | Freq: Two times a day (BID) | SUBCUTANEOUS | Status: DC
  Administered 2019-06-26 – 2019-06-28 (×4): 25 [IU] via SUBCUTANEOUS
  Filled 2019-06-26 (×5): qty 0.25

## 2019-06-26 MED ORDER — VITAMIN C 500 MG PO TABS
1000.0000 mg | ORAL_TABLET | Freq: Every evening | ORAL | Status: DC
  Administered 2019-06-26 – 2019-07-09 (×14): 1000 mg via ORAL
  Filled 2019-06-26 (×14): qty 2

## 2019-06-26 MED ORDER — INSULIN ASPART 100 UNIT/ML ~~LOC~~ SOLN
0.0000 [IU] | Freq: Every day | SUBCUTANEOUS | Status: DC
  Administered 2019-06-26: 22:00:00 3 [IU] via SUBCUTANEOUS
  Administered 2019-06-27: 5 [IU] via SUBCUTANEOUS
  Administered 2019-06-28: 3 [IU] via SUBCUTANEOUS
  Administered 2019-06-30: 2 [IU] via SUBCUTANEOUS
  Administered 2019-07-01: 23:00:00 4 [IU] via SUBCUTANEOUS

## 2019-06-26 MED ORDER — INSULIN ASPART 100 UNIT/ML ~~LOC~~ SOLN
0.0000 [IU] | Freq: Three times a day (TID) | SUBCUTANEOUS | Status: DC
  Administered 2019-06-27: 3 [IU] via SUBCUTANEOUS
  Administered 2019-06-27: 15 [IU] via SUBCUTANEOUS
  Administered 2019-06-27: 3 [IU] via SUBCUTANEOUS
  Administered 2019-06-28: 15 [IU] via SUBCUTANEOUS
  Administered 2019-06-28: 2 [IU] via SUBCUTANEOUS
  Administered 2019-06-28: 12:00:00 11 [IU] via SUBCUTANEOUS
  Administered 2019-06-29: 15 [IU] via SUBCUTANEOUS
  Administered 2019-06-29: 2 [IU] via SUBCUTANEOUS
  Administered 2019-06-30 (×2): 11 [IU] via SUBCUTANEOUS
  Administered 2019-06-30 – 2019-07-01 (×3): 15 [IU] via SUBCUTANEOUS
  Administered 2019-07-02: 2 [IU] via SUBCUTANEOUS
  Administered 2019-07-02: 3 [IU] via SUBCUTANEOUS

## 2019-06-26 MED ORDER — DIGOXIN 125 MCG PO TABS
0.1250 mg | ORAL_TABLET | Freq: Every day | ORAL | Status: DC
  Administered 2019-06-26 – 2019-07-09 (×14): 0.125 mg via ORAL
  Filled 2019-06-26 (×14): qty 1

## 2019-06-26 MED ORDER — VITAMIN D 25 MCG (1000 UNIT) PO TABS
1000.0000 [IU] | ORAL_TABLET | Freq: Every day | ORAL | Status: DC
  Administered 2019-06-27 – 2019-07-10 (×13): 1000 [IU] via ORAL
  Filled 2019-06-26 (×14): qty 1

## 2019-06-26 MED ORDER — ICOSAPENT ETHYL 1 G PO CAPS: 2.0000 g | ORAL_CAPSULE | Freq: Two times a day (BID) | ORAL | Status: DC

## 2019-06-26 MED ORDER — ESCITALOPRAM OXALATE 10 MG PO TABS
10.0000 mg | ORAL_TABLET | Freq: Every evening | ORAL | Status: DC
  Administered 2019-06-26 – 2019-07-09 (×14): 10 mg via ORAL
  Filled 2019-06-26 (×14): qty 1

## 2019-06-26 MED ORDER — POTASSIUM CHLORIDE CRYS ER 20 MEQ PO TBCR: 60.0000 meq | EXTENDED_RELEASE_TABLET | Freq: Once | ORAL | Status: DC

## 2019-06-26 MED ORDER — ALBUTEROL SULFATE (2.5 MG/3ML) 0.083% IN NEBU: 2.5000 mg | INHALATION_SOLUTION | Freq: Four times a day (QID) | RESPIRATORY_TRACT | Status: DC | PRN

## 2019-06-26 MED ORDER — HYDRALAZINE HCL 25 MG PO TABS
12.5000 mg | ORAL_TABLET | Freq: Two times a day (BID) | ORAL | Status: DC
  Administered 2019-06-26 – 2019-07-06 (×19): 12.5 mg via ORAL
  Filled 2019-06-26 (×20): qty 1

## 2019-06-26 MED ORDER — EZETIMIBE 10 MG PO TABS
10.0000 mg | ORAL_TABLET | Freq: Every day | ORAL | Status: DC
  Administered 2019-06-26 – 2019-07-10 (×14): 10 mg via ORAL
  Filled 2019-06-26 (×14): qty 1

## 2019-06-26 NOTE — ED Provider Notes (Signed)
[facility] EMERGENCY DEPARTMENT Provider Note   CSN: [id number] Arrival date & time: 06/26/19  1248     History   Chief Complaint Chief Complaint  Patient presents with   Chest Pain    HPI Joseph Hoffman is a 68 y.o. male.  He has significant cardiac disease including mechanical aortic valve.  He follows with Dr. Claiborne Billings from cardiology and is anticipated for a cath and a valve redo.  He is complaining of 3 days of intermittent chest discomfort 5 out of 10 substernal and left-sided associated with shortness of breath diaphoresis.  Is been responsive to nitro for the last 2 days but not today.  He is on Coumadin for A. fib he is on diuretics and he is on digoxin along with the rest of his cardiac meds.  Denies any fevers or cough.  No vomiting or diarrhea.     The history is provided by the patient.  Chest Pain Pain location:  L chest Pain quality: pressure   Pain radiates to:  Does not radiate Pain severity:  Moderate Onset quality:  Gradual Timing:  Intermittent Progression:  Worsening Chronicity:  Recurrent Relieved by:  Nitroglycerin Worsened by:  Nothing Ineffective treatments:  Nitroglycerin Associated symptoms: diaphoresis, lower extremity edema and shortness of breath   Associated symptoms: no abdominal pain, no altered mental status, no back pain, no cough, no fever, no headache, no nausea and no vomiting   Risk factors: coronary artery disease, hypertension and male sex     Past Medical History:  Diagnosis Date   Anxiety    Aortic stenosis    Arthritis    Asthma    Asymptomatic LV dysfunction, EF 45%, normal coronary arteries on cardiac cath 09/18/12 09/30/2012   CHF (congestive heart failure) (HCC)    Chronic anticoagulation, on Xarelto prior to admit 09/30/2012   DM (diabetes mellitus) ([place]) 09/30/2012   Hepatitis 2003   Hypertension    Myocardial infarction [facility])    Permanent atrial fibrillation, since 1994 09/30/2012   stress test 02/08/12- normal study, no significant ischemia   S/P AVR (aortic valve replacement), 09/30/2012   a. s/p mechcanical AVR in 09/2012 (on Coumadin)   Sleep apnea    uses CPAP    Patient Active Problem List   Diagnosis Date Noted   Depression 05/28/2019   Congestive heart failure (CHF) ([place]) 05/13/2019   H/O heart valve replacement with mechanical valve    Type II diabetes mellitus with renal manifestations ([place]) 05/12/2019   Chest pain 05/12/2019   Elevated troponin 05/12/2019   CKD (chronic kidney disease), stage III ([place]) 05/12/2019   Acute on chronic systolic (congestive) heart failure ([place]) 05/12/2019   CRI (chronic renal insufficiency), stage 3 (moderate) ([place]) 04/09/2019   Acute combined systolic and diastolic heart failure ([place]) 04/09/2019   Essential hypertension 06/03/2017   Dyslipidemia, goal LDL below 70 04/11/2017   AS (aortic stenosis) 10/28/2012   Acute on chronic systolic heart failure, re-admitted 10/12/12 10/12/2012   Nonischemic cardiomyopathy, EF 40-45% 10/12/2012   S/P AVR, 09/29/12, [employer] (discharged 10/05/12) 09/30/2012   Permanent atrial fibrillation, since 1994 09/30/2012   Type 2 IDDM 09/30/2012   Sleep apnea, on C-pap 09/30/2012   Normal coronary arteries at cath Oct 2013 09/30/2012   Chronic anticoagulation 09/30/2012   Severe aortic stenosis 09/25/2012    Past Surgical History:  Procedure Laterality Date   ABDOMINAL ANGIOGRAM  09/18/2012   Procedure: ABDOMINAL ANGIOGRAM;  Surgeon: Troy Sine, MD;  Location:  [place] CATH LAB;  Service: Cardiovascular;;   AORTIC VALVE REPLACEMENT  09/29/2012   Procedure: AORTIC VALVE REPLACEMENT (AVR);  Surgeon: Gaye Pollack, MD;  Location: [place];  Service: Open Heart Surgery;  Laterality: N/A;   ARCH AORTOGRAM  09/18/2012   Procedure: ARCH AORTOGRAM;  Surgeon: Troy Sine, MD;  Location: [facility] CATH LAB;  Service: Cardiovascular;;   CARDIAC CATHETERIZATION  09/18/12   severe  calcific aortic stenosis, peak gradient 24mm, mean gradient 10mm, EF 45%   CARDIAC VALVE REPLACEMENT     AVR 09-29-12   CHOLECYSTECTOMY     FRACTURE SURGERY     LEFT AND RIGHT HEART CATHETERIZATION WITH CORONARY ANGIOGRAM N/A 09/18/2012   Procedure: LEFT AND RIGHT HEART CATHETERIZATION WITH CORONARY ANGIOGRAM;  Surgeon: Troy Sine, MD;  Location: [facility] CATH LAB;  Service: Cardiovascular;  Laterality: N/A;   TEE WITHOUT CARDIOVERSION N/A 05/29/2019   Procedure: TRANSESOPHAGEAL ECHOCARDIOGRAM (TEE);  Surgeon: Lelon Perla, MD;  Location: [facility] ENDOSCOPY;  Service: Cardiovascular;  Laterality: N/A;        Home Medications    Prior to Admission medications   Medication Sig Start Date End Date Taking? Authorizing Provider  acetaminophen (TYLENOL) 325 MG tablet Take 2 tablets (650 mg total) by mouth every 4 (four) hours as needed for pain or fever. 10/16/12   Erlene Quan, PA-C  albuterol (PROVENTIL HFA;VENTOLIN HFA) 108 (90 BASE) MCG/ACT inhaler Inhale 2 puffs into the lungs every 6 (six) hours as needed for wheezing or shortness of breath.     [provider]  Ascorbic Acid (VITAMIN C) 1000 MG tablet Take 1,000 mg by mouth every evening.     [provider]  B-D ULTRAFINE III SHORT PEN 31G X 8 MM MISC  10/04/13   [provider]  cholecalciferol (VITAMIN D) 1000 UNITS tablet Take 1,000 Units by mouth daily.     [provider]  digoxin (LANOXIN) 0.125 MG tablet TAKE 1 TABLET EVERY DAY Patient taking differently: Take 0.125 mg by mouth at bedtime.  01/21/19   Troy Sine, MD  EASY TOUCH LANCETS 30G/TWIST [place]  11/05/13   [provider]  empagliflozin (JARDIANCE) 25 MG TABS tablet Take 25 mg by mouth daily. 04/20/19   Troy Sine, MD  escitalopram (LEXAPRO) 10 MG tablet Take 10 mg by mouth every evening.     [provider]  ezetimibe (ZETIA) 10 MG tablet Take 1 tablet (10 mg total) by mouth daily. Patient taking differently:  Take 10 mg by mouth at bedtime. Refilled and taking as prescribed 02/09/19 06/12/19  Almyra Deforest, PA  fenofibrate 160 MG tablet Take 1 tablet (160 mg total) by mouth daily. 05/16/19   Black, Lezlie Octave, NP  Fluticasone-Salmeterol (ADVAIR) 250-50 MCG/DOSE AEPB Inhale 1 puff into the lungs as needed (shortness of breath). For asthma related symptoms    [provider]  glimepiride (AMARYL) 2 MG tablet Take 2 mg by mouth daily with breakfast.  05/25/17   [provider]  Glucosamine-Chondroit-Vit C-Mn (GLUCOSAMINE 1500 COMPLEX PO) Take 1,500 mg by mouth every evening.     [provider]  hydrALAZINE (APRESOLINE) 25 MG tablet Take 0.5 tablets (12.5 mg total) by mouth 2 (two) times a day. 06/03/19 09/01/19  Troy Sine, MD  Icosapent Ethyl (VASCEPA) 1 g CAPS Take 2 capsules (2 g total) by mouth 2 (two) times daily. 04/20/19   Troy Sine, MD  insulin glargine (LANTUS) 100 UNIT/ML injection Inject 20-35 Units into  the skin 2 (two) times daily. Depending on Blood sugar reading    [provider]  isosorbide mononitrate (IMDUR) 30 MG 24 hr tablet Take 1 tablet (30 mg total) by mouth daily. 06/03/19 09/01/19  Troy Sine, MD  metoprolol succinate (TOPROL XL) 25 MG 24 hr tablet Take 1.5 tablets (37.5 mg total) by mouth 2 (two) times a day. 05/15/19   Black, Lezlie Octave, NP  Multiple Vitamins-Minerals (CENTRUM SPECIALIST HEART PO) Take 1 tablet by mouth daily.     [provider]  nitroGLYCERIN (NITROSTAT) 0.4 MG SL tablet Place 1 tablet (0.4 mg total) under the tongue every 5 (five) minutes as needed for chest pain. 05/30/19   [place] Shi, MD  saccharomyces boulardii (FLORASTOR) 250 MG capsule Take 250 mg by mouth 3 (three) times a week.    [provider]  torsemide (DEMADEX) 20 MG tablet Take 1 tablet (20 mg total) by mouth 2 (two) times daily. 05/30/19   [place] Shi, MD  warfarin (COUMADIN) 10 MG tablet Take 10 mg by mouth See admin instructions. Take on  10mg  on  Sun. Mon. Wed. Fri.    [provider]  warfarin (COUMADIN) 5 MG tablet Take as instructed with 10 mg. Patient taking differently: Take 2.5 mg by mouth See admin instructions. Take 1/2 tablet (2.5mg ) with 10 mg tablet to get a total of 12.5 on Tues, Thurs and Saturday 12/11/18   Troy Sine, MD    Family History Family History  Problem Relation Age of Onset   Hypertension Mother    Diabetes Father    Heart attack Brother 39   Hyperlipidemia Brother 31       stents placed    Social History Social History   Tobacco Use   Smoking status: Former Smoker    Packs/day: 0.25    Years: 10.00    Pack years: 2.50    Types: Cigarettes    Quit date: 09/25/1982    Years since quitting: 36.7   Smokeless tobacco: Former Systems developer  Substance Use Topics   Alcohol use: Yes    Comment: occ. beer/wine   Drug use: No     Allergies   Iohexol, Niacin and related, and Penicillins   Review of Systems Review of Systems  Constitutional: Positive for diaphoresis. Negative for fever.  HENT: Negative for sore throat.   Eyes: Negative for visual disturbance.  Respiratory: Positive for shortness of breath. Negative for cough.   Cardiovascular: Positive for chest pain and leg swelling.  Gastrointestinal: Negative for abdominal pain, nausea and vomiting.  Genitourinary: Negative for dysuria.  Musculoskeletal: Negative for back pain.  Skin: Negative for rash.  Neurological: Negative for headaches.     Physical Exam Updated Vital Signs BP (!) 146/70 (BP Location: Right Arm)    Pulse 80    Temp 98 F (36.7 C) (Oral)    Resp 16    SpO2 95%   Physical Exam Vitals signs and nursing note reviewed.  Constitutional:      Appearance: He is well-developed. He is diaphoretic.  HENT:     Head: Normocephalic and atraumatic.  Eyes:     Conjunctiva/sclera: Conjunctivae normal.  Neck:     Musculoskeletal: Neck supple.  Cardiovascular:     Rate and Rhythm: Normal rate. Rhythm  irregular.     Heart sounds: No murmur.     Comments: Mechanical click Pulmonary:     Effort: Pulmonary effort is normal. No respiratory distress.     Breath sounds:  Normal breath sounds.  Abdominal:     Palpations: Abdomen is soft.     Tenderness: There is no abdominal tenderness.  Musculoskeletal:     Right lower leg: He exhibits tenderness. Edema present.     Left lower leg: He exhibits tenderness. Edema present.     Comments: Patient has some erythema of his left lower extremity but no particular warmth.  He says this is unchanged.  Skin:    General: Skin is warm.     Capillary Refill: Capillary refill takes less than 2 seconds.     Findings: Erythema (LLE) present.  Neurological:     General: No focal deficit present.     Mental Status: He is alert and oriented to person, place, and time.      ED Treatments / Results  Labs (all labs ordered are listed, but only abnormal results are displayed) Labs Reviewed  BASIC METABOLIC PANEL - Abnormal; Notable for the following components:      Result Value   Sodium 134 (*)    CO2 21 (*)    Glucose, Bld 227 (*)    BUN 46 (*)    Creatinine, Ser 2.12 (*)    GFR calc non Af Amer 31 (*)    GFR calc Af Amer 35 (*)    All other components within normal limits  CBC - Abnormal; Notable for the following components:   WBC 14.9 (*)    MCH 25.9 (*)    RDW 17.2 (*)    All other components within normal limits  BRAIN NATRIURETIC PEPTIDE - Abnormal; Notable for the following components:   B Natriuretic Peptide 669.2 (*)    All other components within normal limits  PROTIME-INR - Abnormal; Notable for the following components:   Prothrombin Time 37.3 (*)    INR 3.9 (*)    All other components within normal limits  DIGOXIN LEVEL - Abnormal; Notable for the following components:   Digoxin Level 0.3 (*)    All other components within normal limits  GLUCOSE, CAPILLARY - Abnormal; Notable for the following components:   Glucose-Capillary  149 (*)    All other components within normal limits  BASIC METABOLIC PANEL - Abnormal; Notable for the following components:   Chloride 96 (*)    Glucose, Bld 170 (*)    BUN 45 (*)    Creatinine, Ser 2.19 (*)    GFR calc non Af Amer 29 (*)    GFR calc Af Amer 34 (*)    All other components within normal limits  CBC - Abnormal; Notable for the following components:   WBC 12.4 (*)    MCH 25.7 (*)    RDW 17.2 (*)    All other components within normal limits  PROTIME-INR - Abnormal; Notable for the following components:   Prothrombin Time 34.0 (*)    INR 3.4 (*)    All other components within normal limits  GLUCOSE, CAPILLARY - Abnormal; Notable for the following components:   Glucose-Capillary 277 (*)    All other components within normal limits  GLUCOSE, CAPILLARY - Abnormal; Notable for the following components:   Glucose-Capillary 169 (*)    All other components within normal limits  CBG MONITORING, ED - Abnormal; Notable for the following components:   Glucose-Capillary 219 (*)    All other components within normal limits  TROPONIN I (HIGH SENSITIVITY) - Abnormal; Notable for the following components:   Troponin I (High Sensitivity) 96 (*)    All other  components within normal limits  TROPONIN I (HIGH SENSITIVITY) - Abnormal; Notable for the following components:   Troponin I (High Sensitivity) 99 (*)    All other components within normal limits  SARS CORONAVIRUS 2 (HOSPITAL ORDER, PERFORMED IN [place])  CULTURE, BLOOD (ROUTINE X 2)  CULTURE, BLOOD (ROUTINE X 2)  LACTIC ACID, PLASMA  LACTIC ACID, PLASMA  TYPE AND SCREEN    EKG EKG Interpretation  Date/Time:  Friday June 26 2019 12:55:46 EDT Ventricular Rate:  80 PR Interval:    QRS Duration: 152 QT Interval:  440 QTC Calculation: 507 R Axis:   -107 Text Interpretation:  Atrial fibrillation with premature ventricular or aberrantly conducted complexes Right bundle branch block Possible Lateral  infarct , age undetermined Abnormal ECG similar to prior 6/20 Confirmed by Aletta Edouard [phone]) on 06/26/2019 1:11:32 PM   Radiology Dg Chest Portable 1 View  Result Date: 06/26/2019 CLINICAL DATA:  Shortness of breath for 3 days. EXAM: PORTABLE CHEST 1 VIEW COMPARISON:  05/27/2019 and older exams. FINDINGS: Stable changes from prior cardiac surgery. Cardiac silhouette is mildly enlarged. No mediastinal or hilar masses. Lungs show prominent bronchovascular markings similar to the prior study. No evidence of pneumonia. No convincing pleural effusion and no pneumothorax. Skeletal structures are grossly intact. IMPRESSION: 1. Cardiomegaly with prominent bronchovascular markings but no overt pulmonary edema. No evidence of pneumonia. Electronically Signed   By: Lajean Manes M.D.   On: 06/26/2019 13:42   Vas [place] Burnard Bunting With/wo Tbi  Result Date: 06/27/2019 LOWER EXTREMITY DOPPLER STUDY Indications: Ulceration. High Risk Factors: Hypertension, hyperlipidemia, Diabetes. Other Factors: Permanemt A-Fib, CKD Stage 3, Chronic combined CHF.  Comparison Study: No previous exam available for comparison Performing Technologist: Birdena Crandall, [place] [facility]  Examination Guidelines: A complete evaluation includes at minimum, Doppler waveform signals and systolic blood pressure reading at the level of bilateral brachial, anterior tibial, and posterior tibial arteries, when vessel segments are accessible. Bilateral testing is considered an integral part of a complete examination. Photoelectric Plethysmograph (PPG) waveforms and toe systolic pressure readings are included as required and additional duplex testing as needed. Limited examinations for reoccurring indications may be performed as noted.  ABI Findings: +---------+------------------+-----+----------+--------+  Right     Rt Pressure (mmHg) Index Waveform   Comment   +---------+------------------+-----+----------+--------+  Brachial  158                      triphasic             +---------+------------------+-----+----------+--------+  PTA       255                1.61  monophasic           +---------+------------------+-----+----------+--------+  DP        190                1.20  monophasic           +---------+------------------+-----+----------+--------+  Great Toe 59                 0.37                       +---------+------------------+-----+----------+--------+ +---------+------------------+-----+----------+-------------------------+  Left      Lt Pressure (mmHg) Index Waveform   Comment                    +---------+------------------+-----+----------+-------------------------+  Brachial  148  triphasic                             +---------+------------------+-----+----------+-------------------------+  PTA       115                0.73  monophasic                            +---------+------------------+-----+----------+-------------------------+  DP        134                0.85  monophasic probably falsely elevated  +---------+------------------+-----+----------+-------------------------+  Great Toe 116                0.73                                        +---------+------------------+-----+----------+-------------------------+ +-------+-----------+-----------+------------+------------+  ABI/TBI Today's ABI Today's TBI Previous ABI Previous TBI  +-------+-----------+-----------+------------+------------+  Right   n/c         0.37                                   +-------+-----------+-----------+------------+------------+  Left    0.85        0.73                                   +-------+-----------+-----------+------------+------------+ Difficult to evaluate the Doppler waveforms due to cardiac arrythmia and movement of the foot.  Summary: Right: Resting right ankle-brachial index indicates noncompressible right lower extremity arteries.The right toe-brachial index is abnormal. Difficult to evaluate the Doppler waveforms due to invountary  movement of the foot and cardiac arrythmia. Left: Resting left ankle-brachial index indicates mild left lower extremity arterial disease. The left toe-brachial index is normal. Difficult to evaluate the Doppler waveforms due to cardiac arrythmia.  *See table(s) above for measurements and observations.  Electronically signed by Deitra Mayo MD on 06/27/2019 at 6:35:01 AM.    Final     Procedures Procedures (including critical care time)  Medications Ordered in ED Medications  cefTRIAXone (ROCEPHIN) 2 g in sodium chloride 0.9 % 100 mL IVPB (0 g Intravenous Stopped 06/26/19 1653)  metroNIDAZOLE (FLAGYL) IVPB 500 mg (500 mg Intravenous New Bag/Given 06/27/19 0026)  digoxin (LANOXIN) tablet 0.125 mg (0.125 mg Oral Given 06/26/19 2041)  ezetimibe (ZETIA) tablet 10 mg (10 mg Oral Given 06/26/19 1845)  fenofibrate tablet 160 mg (has no administration in time range)  hydrALAZINE (APRESOLINE) tablet 12.5 mg (12.5 mg Oral Given 06/26/19 2039)  isosorbide mononitrate (IMDUR) 24 hr tablet 30 mg (has no administration in time range)  metoprolol succinate (TOPROL-XL) 24 hr tablet 37.5 mg (37.5 mg Oral Given 06/26/19 2040)  nitroGLYCERIN (NITROSTAT) SL tablet 0.4 mg (has no administration in time range)  escitalopram (LEXAPRO) tablet 10 mg (10 mg Oral Given 06/26/19 1846)  insulin glargine (LANTUS) injection 25 Units (25 Units Subcutaneous Given 06/26/19 2222)  saccharomyces boulardii (FLORASTOR) capsule 250 mg (250 mg Oral Given 06/26/19 1848)  vitamin C (ASCORBIC ACID) tablet 1,000 mg (1,000 mg Oral Given 06/26/19 1845)  cholecalciferol (VITAMIN D3) tablet 1,000 Units (has no administration in time range)  mometasone-formoterol (DULERA) 200-5 MCG/ACT inhaler  2 puff (2 puffs Inhalation Given 06/27/19 0758)  acetaminophen (TYLENOL) tablet 650 mg (650 mg Oral Given 06/27/19 0448)    Or  acetaminophen (TYLENOL) suppository 650 mg ( Rectal See Alternative 06/27/19 0448)  vancomycin (VANCOCIN) IVPB 1000 mg/200 mL  premix (has no administration in time range)  furosemide (LASIX) injection 80 mg (80 mg Intravenous Given 06/26/19 1845)  insulin aspart (novoLOG) injection 0-15 Units (3 Units Subcutaneous Given 06/27/19 0637)  insulin aspart (novoLOG) injection 0-5 Units (3 Units Subcutaneous Given 06/26/19 2216)  albuterol (PROVENTIL) (2.5 MG/3ML) 0.083% nebulizer solution 2.5 mg (has no administration in time range)  oxyCODONE (Oxy IR/ROXICODONE) immediate release tablet 5 mg (has no administration in time range)  HYDROmorphone (DILAUDID) injection 1 mg (has no administration in time range)  senna-docusate (Senokot-S) tablet 2 tablet (has no administration in time range)  Icosapent Ethyl CAPS 2 g (has no administration in time range)  vancomycin (VANCOCIN) 2,500 mg in sodium chloride 0.9 % 500 mL IVPB (2,500 mg Intravenous New Bag/Given 06/26/19 1901)     Initial Impression / Assessment and Plan / ED Course  I have reviewed the triage vital signs and the nursing notes.  Pertinent labs & imaging results that were available during my care of the patient were reviewed by me and considered in my medical decision making (see chart for details).  Clinical Course as of Jun 26 838  Fri Jun 25, 1577  7310 68 year old male with known coronary disease valvular disease here with chest pain shortness of breath.  Differential includes ACS, CHF, dissection, PE, arrhythmia.  I put a consult into cardiology and Trish current semester is going to have somebody come down to take a look.   [MB]  3500 Chest x-ray with cardiomegaly but no gross infiltrate.  EKG is A. fib with PVCs right bundle branch block similar to prior EKG.   [MB]  9381 Patient's troponin elevated at 96 and his creatinine is slightly worse at 2.12.   [MB]  1444 It sounds like cardiology stopped through and was concerned that the patient has a cellulitis of his leg and should be admitted to medical service although we will continue to consult.  We will put a  call into hospitalist service for admission and or adding on blood cultures lactate and starting antibiotics.  Patient has a penicillin allergy which he says was told to him by his mother for some sort of rash as a child.  I reviewed with pharmacy who says he has not ever received a cephalosporin.  They are recommending cefepime and Flagyl for diabetic cellulitis coverage   [MB]  1507 I talked with the hospitalist who was going to reach out to cardiology regarding who is going to be admitting the patient.   [MB]    Clinical Course User Index [MB] Hayden Rasmussen, MD   [name] was evaluated in Emergency Department on 06/26/2019 for the symptoms described in the history of present illness. He was evaluated in the context of the global COVID-19 pandemic, which necessitated consideration that the patient might be at risk for infection with the SARS-CoV-2 virus that causes COVID-19. Institutional protocols and algorithms that pertain to the evaluation of patients at risk for COVID-19 are in a state of rapid change based on information released by regulatory bodies including the CDC and federal and state organizations. These policies and algorithms were followed during the patient's care in the ED.      Final Clinical Impressions(s) / ED Diagnoses  Final diagnoses:  Cellulitis of left lower leg  Chest pain, unspecified type  SOB (shortness of breath)  Aortic valve disease    ED Discharge Orders    None       Hayden Rasmussen, MD 06/27/19 [id number]

## 2019-06-26 NOTE — Telephone Encounter (Signed)
Patient's wife called stating Joseph Hoffman has been having chills, weakness, left chest discomfort, cant's sleep at night, has been taking his nitroglycerin that helps about 50% of the time, his left leg and ankle is swollen, has not gained any weight, he is SOB occasionally.  This started about two days ago.

## 2019-06-26 NOTE — Consult Note (Addendum)
Cardiology Consultation:   Patient ID: DUSTYN DANSEREAU; [id number]; 28-Jul-1951   Admit date: 06/26/2019 Date of Consult: 06/26/2019  Primary Care Provider: Orpah Melter, MD Primary Cardiologist: Shelva Majestic, MD Primary Electrophysiologist:  None   Patient Profile:   ZYAD BOOMER is a 68 y.o. male with a PMH of permanent atrial fibrillation, chronic combined CHF, AVR in 2013, HTN, HLD, OSA on CPAP, DM type 2, CKD stage 3, and hepatitis, who is being seen today for the evaluation of chest pain at the request of Dr. Melina Copa.  History of Present Illness:   Mr. Lehrke was in his usual state of health until 2 days ago when he began experiencing chest pressure. He reported onset of pressure at rest with symptoms lasting hours with associated SOB. He reported taking SL nitro with resolution of pain, only to have pain return hours later. Today, he reported chest pressure was not resolved with SL nitro, prompting him to present to the ED for further evaluation.   He was last evaluated by cardiology during a telemedicine visit with Dr. Claiborne Billings 06/03/2019, at which time he was still experiencing some LE edema after recently being discharged from the hospital. He was hospitalized from 05/27/2019-05/30/2019 after presenting with chest pain and SOB. His last echo 04/2019 showed EF 45-50%, with moderate LVH, elevated LA and LVEDP, severe biatrial enlargement, mild-moderate MR/MS, and mild AI/moderate AS. His last ischemic evaluation was a NST 05/15/2019 which revealed a fixed defect but no reversible ischemia, but revealed global hypokinesis and EF 31%. Given drop in EF and and recurrent CHF, he was recommended to undergo TEE which showed EF 30-35%, diffuse hypokinesis, severely reduced RV function, severe biatrial enlargement, and severe perivalvular AI. He was transitioned to torsemide at discharge for acute on chronic biventricular combined CHF. He was scheduled for a cardiac catheterization 07/02/2019 with  tentative plans for redo AVR with [name] 07/06/2019.  At the time of this evaluation he is chest pain free. He reports overall his SOB and LE edema has improved with torsemide. He reports intermittent chills/sweats for the past 2 days and worsening LLE erythema. No exertional component to his chest pain. He has chronic orthopnea which is unchanged. He notes PND. No complaints of dizziness, lightheadedness, syncope, fever, diarrhea, loss of taste/smell, or cough.   ED course: Hypertensive, intermittently tachypneic, otherwise VSS. Labs notable for electrolytes wnl, Cr 2.12, WBC 14.9, Hgb 13, PLT 240, Trop 96>99, BNP 669 (up from 342 last month), INR 3.9. COVID 19 negative. EKG in the ED with atrial fibrillation with CVR, rate 80 PVC/ectopy, RBBB, TWI in lateral leads, no STE/D. CXR with cardiomegaly without overt pulmonary edema. Cardiology asked to evaluate for chest pain/SOB.   Past Medical History:  Diagnosis Date  . Anxiety   . Aortic stenosis   . Arthritis   . Asthma   . Asymptomatic LV dysfunction, EF 45%, normal coronary arteries on cardiac cath 09/18/12 09/30/2012  . CHF (congestive heart failure) ([place])   . Chronic anticoagulation, on Xarelto prior to admit 09/30/2012  . DM (diabetes mellitus) ([place]) 09/30/2012  . Hepatitis 2003  . Hypertension   . Myocardial infarction ([place])   . Permanent atrial fibrillation, since 1994 09/30/2012   stress test 02/08/12- normal study, no significant ischemia  . S/P AVR (aortic valve replacement), 09/30/2012   a. s/p mechcanical AVR in 09/2012 (on Coumadin)  . Sleep apnea    uses CPAP    Past Surgical History:  Procedure Laterality Date  . ABDOMINAL  ANGIOGRAM  09/18/2012   Procedure: ABDOMINAL ANGIOGRAM;  Surgeon: Troy Sine, MD;  Location: [facility] CATH LAB;  Service: Cardiovascular;;  . AORTIC VALVE REPLACEMENT  09/29/2012   Procedure: AORTIC VALVE REPLACEMENT (AVR);  Surgeon: Gaye Pollack, MD;  Location: [place];  Service: Open Heart Surgery;   Laterality: N/A;  . ARCH AORTOGRAM  09/18/2012   Procedure: ARCH AORTOGRAM;  Surgeon: Troy Sine, MD;  Location: [facility] CATH LAB;  Service: Cardiovascular;;  . CARDIAC CATHETERIZATION  09/18/12   severe calcific aortic stenosis, peak gradient 44mm, mean gradient 42mm, EF 45%  . CARDIAC VALVE REPLACEMENT     AVR 09-29-12  . CHOLECYSTECTOMY    . FRACTURE SURGERY    . LEFT AND RIGHT HEART CATHETERIZATION WITH CORONARY ANGIOGRAM N/A 09/18/2012   Procedure: LEFT AND RIGHT HEART CATHETERIZATION WITH CORONARY ANGIOGRAM;  Surgeon: Troy Sine, MD;  Location: [facility] CATH LAB;  Service: Cardiovascular;  Laterality: N/A;  . TEE WITHOUT CARDIOVERSION N/A 05/29/2019   Procedure: TRANSESOPHAGEAL ECHOCARDIOGRAM (TEE);  Surgeon: Lelon Perla, MD;  Location: [facility] ENDOSCOPY;  Service: Cardiovascular;  Laterality: N/A;     Home Medications:  Prior to Admission medications   Medication Sig Start Date End Date Taking? Authorizing Provider  acetaminophen (TYLENOL) 325 MG tablet Take 2 tablets (650 mg total) by mouth every 4 (four) hours as needed for pain or fever. 10/16/12   Erlene Quan, PA-C  albuterol (PROVENTIL HFA;VENTOLIN HFA) 108 (90 BASE) MCG/ACT inhaler Inhale 2 puffs into the lungs every 6 (six) hours as needed for wheezing or shortness of breath.     [provider]  Ascorbic Acid (VITAMIN C) 1000 MG tablet Take 1,000 mg by mouth every evening.     [provider]  B-D ULTRAFINE III SHORT PEN 31G X 8 MM MISC  10/04/13   [provider]  cholecalciferol (VITAMIN D) 1000 UNITS tablet Take 1,000 Units by mouth daily.     [provider]  digoxin (LANOXIN) 0.125 MG tablet TAKE 1 TABLET EVERY DAY Patient taking differently: Take 0.125 mg by mouth at bedtime.  01/21/19   Troy Sine, MD  EASY TOUCH LANCETS 30G/TWIST [place]  11/05/13   [provider]  empagliflozin (JARDIANCE) 25 MG TABS tablet Take 25 mg by mouth daily. 04/20/19   Troy Sine, MD   escitalopram (LEXAPRO) 10 MG tablet Take 10 mg by mouth every evening.     [provider]  ezetimibe (ZETIA) 10 MG tablet Take 1 tablet (10 mg total) by mouth daily. Patient taking differently: Take 10 mg by mouth at bedtime. Refilled and taking as prescribed 02/09/19 06/12/19  Almyra Deforest, PA  fenofibrate 160 MG tablet Take 1 tablet (160 mg total) by mouth daily. 05/16/19   Black, Lezlie Octave, NP  Fluticasone-Salmeterol (ADVAIR) 250-50 MCG/DOSE AEPB Inhale 1 puff into the lungs as needed (shortness of breath). For asthma related symptoms    [provider]  glimepiride (AMARYL) 2 MG tablet Take 2 mg by mouth daily with breakfast.  05/25/17   [provider]  Glucosamine-Chondroit-Vit C-Mn (GLUCOSAMINE 1500 COMPLEX PO) Take 1,500 mg by mouth every evening.     [provider]  hydrALAZINE (APRESOLINE) 25 MG tablet Take 0.5 tablets (12.5 mg total) by mouth 2 (two) times a day. 06/03/19 09/01/19  Troy Sine, MD  Icosapent Ethyl (VASCEPA) 1 g CAPS Take 2 capsules (2 g total) by mouth 2 (two) times daily. 04/20/19   Troy Sine, MD  insulin glargine (LANTUS) 100 UNIT/ML injection Inject 20-35 Units into the skin 2 (two) times daily. Depending on Blood sugar reading    [provider]  isosorbide mononitrate (IMDUR) 30 MG 24 hr tablet Take 1 tablet (30 mg total) by mouth daily. 06/03/19 09/01/19  Troy Sine, MD  metoprolol succinate (TOPROL XL) 25 MG 24 hr tablet Take 1.5 tablets (37.5 mg total) by mouth 2 (two) times a day. 05/15/19   Black, Lezlie Octave, NP  Multiple Vitamins-Minerals (CENTRUM SPECIALIST HEART PO) Take 1 tablet by mouth daily.     [provider]  nitroGLYCERIN (NITROSTAT) 0.4 MG SL tablet Place 1 tablet (0.4 mg total) under the tongue every 5 (five) minutes as needed for chest pain. 05/30/19   [place] Shi, MD  saccharomyces boulardii (FLORASTOR) 250 MG capsule Take 250 mg by mouth 3 (three) times a week.    [provider]   torsemide (DEMADEX) 20 MG tablet Take 1 tablet (20 mg total) by mouth 2 (two) times daily. 05/30/19   [place] Shi, MD  warfarin (COUMADIN) 10 MG tablet Take 10 mg by mouth See admin instructions. Take on 10mg  on  Sun. Mon. Wed. Fri.    [provider]  warfarin (COUMADIN) 5 MG tablet Take as instructed with 10 mg. Patient taking differently: Take 2.5 mg by mouth See admin instructions. Take 1/2 tablet (2.5mg ) with 10 mg tablet to get a total of 12.5 on Tues, Thurs and Saturday 12/11/18   Troy Sine, MD    Inpatient Medications: Scheduled Meds:  Continuous Infusions:  PRN Meds:   Allergies:    Allergies  Allergen Reactions  . Iohexol Anaphylaxis  . Niacin And Related     Flushing with immediate realese  . Penicillins Other (See Comments)    Unknown.[name]  Did it involve swelling of the face/tongue/throat, SOB, or low BP? Unknown Did it involve sudden or severe rash/hives, skin peeling, or any reaction on the inside of your mouth or nose? Unknown Did you need to seek medical attention at a hospital or doctor's office? Unknown When did it last happen?Childhood If all above answers are "NO", may proceed with cephalosporin use.    Social History:   Social History   Socioeconomic History  . Marital status: Married    Spouse name: [name]  . Number of children: 3  . Years of education: 15+  . Highest education level: Not on file  Occupational History    Employer: [employer]  . Financial resource strain: Not on file  . Food insecurity    Worry: Not on file    Inability: Not on file  . Transportation needs    Medical: Not on file    Non-medical: Not on file  Tobacco Use  . Smoking status: Former Smoker    Packs/day: 0.25    Years: 10.00    Pack years: 2.50    Types: Cigarettes    Quit date: 09/25/1982    Years since quitting: 36.7  . Smokeless tobacco: Former Network engineer and Sexual Activity  . Alcohol use:  Yes    Comment: occ. beer/wine  . Drug use: No  . Sexual activity: Yes    Birth control/protection: None  Lifestyle  . Physical activity    Days per week: Not on file    Minutes per session: Not on file  . Stress: Not on file  Relationships  . Social Herbalist on phone:  Not on file    Gets together: Not on file    Attends religious service: Not on file    Active member of club or organization: Not on file    Attends meetings of clubs or organizations: Not on file    Relationship status: Not on file  . Intimate partner violence    Fear of current or ex partner: Not on file    Emotionally abused: Not on file    Physically abused: Not on file    Forced sexual activity: Not on file  Other Topics Concern  . Not on file  Social History Narrative   Patient is married [name]) and lives with his wife and son.   Patient has three children.   Patient is working full-time.   Patient has a college education.   Patient is right handed.   Patient drinks about 2-3 cups of coffee daily.    Family History:    Family History  Problem Relation Age of Onset  . Hypertension Mother   . Diabetes Father   . Heart attack Brother 46  . Hyperlipidemia Brother 60       stents placed     ROS:  Please see the history of present illness.   All other ROS reviewed and negative.     Physical Exam/Data:   Vitals:   06/26/19 1255 06/26/19 1318 06/26/19 1330  BP: (!) 146/70  (!) 142/69  Pulse: 80  96  Resp: 16  16  Temp: 98 F (36.7 C)    TempSrc: Oral    SpO2: 95%  98%  Weight:  113.2 kg   Height:  5\' 11"  (1.803 m)    No intake or output data in the 24 hours ending 06/26/19 1427 Filed Weights   06/26/19 1318  Weight: 113.2 kg   Body mass index is 34.8 kg/m.  General:  Ill appearing gentleman laying in bed in no acute distress HEENT: sclera anicteric  Neck: +JVD Vascular: No carotid bruits; distal pulses 2+ bilaterally Cardiac:  normal S1, S2; IRIR; mechanical valve  click with underlying murmur, no rubs/gallops Lungs:  Decreased breath sounds at lung bases, otherwise without wheezing/rales/rhonchi  Abd: NABS, soft, obese, nontender, no hepatomegaly Ext: 1-2+ LE edema L>R Musculoskeletal:  No deformities, BUE and BLE strength normal and equal Skin: LLE erythematous, warm to touch compared to RLE; wound to L great toe wrapped in bandage with some weeping noted.  Neuro:  CNs 2-12 intact, no focal abnormalities noted Psych:  Normal affect   EKG:  The EKG was personally reviewed and demonstrates:  atrial fibrillation with CVR, rate 80 PVC/ectopy, RBBB, TWI in lateral leads, no STE/D. Telemetry:  Telemetry was personally reviewed and demonstrates:  Atrial fibrillation with CVR and frequent PVCs with one ~2 second pause.  Relevant CV Studies: TEE 05/29/2019:  1. The left ventricle has moderate-severely reduced systolic function, with an ejection fraction of 30-35%. Left ventricular diffuse hypokinesis.  2. The right ventricle has severely reduced systolic function. The cavity was mildly enlarged.  3. Left atrial size was severely dilated.  4. Right atrial size was severely dilated.  5. The mitral valve is grossly normal. No evidence of mitral valve stenosis.  6. The tricuspid valve was grossly normal.  7. Aortic valve regurgitation is severe by color flow Doppler.  8. There is evidence of mild plaque in the descending aorta.  9. Moderate to severe global reduction in LV systolic function; mild RVE with severe RV dysfunction; severe biatrial enlargement;  s/p mechanical AVR with mean gradient of 14 mmHg (both leaflets appear to be mobile); severe perivalvular AI; mild MR and  TR.  Echocardiogram 04/2019: SUMMARY   LVEF 45-50%, moderate LVH, global hypokinesis, indeterminate diastolic function with high LV filling pressure, severe biatrial enlargement, mechanical AOV with mild regurgitation, calcified mitral valve and annulus with mild to moderate MS and mild to  moderate MR, mild TR, RVSP 43 mmHg, dilated IVC  NST 05/2019: 1. No reversible ischemia. Fixed defect in the apex and apical inferior wall is likely related to prior infarct.  2. Global hypokinesis.  3. Left ventricular ejection fraction 31%  4. Non invasive risk stratification*: High-based on ejection fraction of less than 35%  Laboratory Data:  Chemistry Recent Labs  Lab 06/26/19 1315  NA 134*  K 4.5  CL 98  CO2 21*  GLUCOSE 227*  BUN 46*  CREATININE 2.12*  CALCIUM 9.7  GFRNONAA 31*  GFRAA 35*  ANIONGAP 15    No results for input(s): PROT, ALBUMIN, AST, ALT, ALKPHOS, BILITOT in the last 168 hours. Hematology Recent Labs  Lab 06/26/19 1315  WBC 14.9*  RBC 5.02  HGB 13.0  HCT 41.1  MCV 81.9  MCH 25.9*  MCHC 31.6  RDW 17.2*  PLT 240   Cardiac EnzymesNo results for input(s): TROPONINI in the last 168 hours. No results for input(s): TROPIPOC in the last 168 hours.  BNPNo results for input(s): BNP, PROBNP in the last 168 hours.  DDimer No results for input(s): DDIMER in the last 168 hours.  Radiology/Studies:  Dg Chest Portable 1 View  Result Date: 06/26/2019 CLINICAL DATA:  Shortness of breath for 3 days. EXAM: PORTABLE CHEST 1 VIEW COMPARISON:  05/27/2019 and older exams. FINDINGS: Stable changes from prior cardiac surgery. Cardiac silhouette is mildly enlarged. No mediastinal or hilar masses. Lungs show prominent bronchovascular markings similar to the prior study. No evidence of pneumonia. No convincing pleural effusion and no pneumothorax. Skeletal structures are grossly intact. IMPRESSION: 1. Cardiomegaly with prominent bronchovascular markings but no overt pulmonary edema. No evidence of pneumonia. Electronically Signed   By: Lajean Manes M.D.   On: 06/26/2019 13:42    Assessment and Plan:   1. Acute on chronic biventricular combined CHF: patient presented with CP/SOB. BNP is 669, up from 342 05/28/2019. CXR without overt pulmonary edema, however he  appears volume overloaded on exam. TEE 05/2019 showed EF 30-35%. He was transitioned to torsemide at discharge a few weeks ago and he reports stable weights at home. Likely a result of severe aortic insufficiency - Will give IV lasix 80mg  BID x2 doses with plans to reassess need for ongoing IV diuresis in AM.  - Continue metoprolol succinate, hydralazine, imdur, and digoxin - Monitor daily weights and strict I&Os - Monitor electrolytes close and replete to maintain K >4, Mg >2.   2. Chest pain: patient with atypical chest pain which occurs at rest, last for hours, and is intermittently relieved with SL nitro. EKG non-ischemic. Trop 96>99, flat trend more consistent with demand ischemia. Recent stress test 05/2019 showed fixed defect, therefore cannot exclude CAD. He was planned for cardiac catheterization 07/02/2019 to evaluate coronary anatomy in preparation for upcoming redo AVR. Likely related to volume overload. - Anticipate RHC + coronary angiography on Monday pending improvement in Cr and downtrending INR.  - Can initiate a heparin gtt per pharmacy once INR <2.5  3. S/p AVR with severe aortic insufficiency: noted on TEE last month. Undergoing work-up for redo AVR by  [name]. Planned for coronary angiography 7/30, however CP occurred prompting admission. INR supratherapeutic at 3.9 today.  - Plan to initiate a heparin gtt per pharmacy once INR <2.5.   4. CKD stage 3: Cr up to 2.12 today from 1.8 on the day of discharge last month. Possibly due to volume overload - Continue diuresis as above and monitor Cr closely  5. LLE Cellulitis: He reports chills/sweats but no fevers at home over the past couple day. He took a dose of bactrim this morning which he had left over from a prior infection. Now with erythema and warmth of LLE c/f recurrent cellulitis  - Continue antibiotics per primary team  6. Permanent atrial fibrillation: rate controlled this admission - Continue metoprolol and digoxin for  rate control - Continue heparin gtt for stroke ppx once INR drops to <2.5   7. HLD: LDL 74 05/2019. Intolerant to statins. - Continue Vascepa, zetia, and fenofibrate  8. OSA:  - Continue CPAP    For questions or updates, please contact [place] Please consult [email or web address] for contact info under Cardiology/STEMI.   Signed, Abigail Butts, PA-C  06/26/2019 2:27 PM [phone]  ATTENDING ATTESTATION  I have seen, examined and evaluated the patient this PM along with Abigail Butts, PA-C .  After reviewing all the available data and chart, we discussed the patients laboratory, study & physical findings as well as symptoms in detail. I agree with her findings, examination as well as impression recommendations as per our discussion.    Patient well-known history of history of mechanical AVR with significant valvular vegetation with pending cardiac catheterization.  He also has had some pretty significant CHF issues with lower extremity edema and left lower leg cellulitis.  He presents with chest tightness that is prolonged for several hours in a row associate also with dyspnea.  But he also notes significant chills and sweats.  He has just started taking some residual antibiotic that he had (Bactrim) for what he felt like was probably recurrent cellulitis.  At this point, I think with recurrent chest pain and the plan to go ahead and proceed with cardiac catheterization that we should plan to move his right left heart cath up until early next week.  We will hold his Coumadin given his INR greater than 3.  He does have worsening dyspnea, chest discomfort with flat troponin that is probably more consistent with CHF exacerbation.  Will give IV Lasix today and then tomorrow.  However need to follow his renal function closely.  This potentially delay cardiac catheterization.  Will defer management of cellulitis to hospital service.     Glenetta Hew, M.D., M.S. Interventional  Cardiologist   Pager # [phone] Phone # [phone] [address] [place], [place] [address]

## 2019-06-26 NOTE — ED Notes (Signed)
Pt CBG was 219, notified Karen(RN)

## 2019-06-26 NOTE — H&P (Signed)
TRH H&P   Patient Demographics:    Joseph Hoffman, is a 68 y.o. male  MRN: [id number]   DOB - Sep 10, 1951  Admit Date - 06/26/2019  Outpatient Primary MD for the patient is Orpah Melter, MD  Referring MD/NP/PA: Dr Melina Copa  Outpatient Specialists: Cards Dr Claiborne Billings  Patient coming from: Home  Chief Complaint  Patient presents with   Chest Pain      HPI:    Joseph Hoffman  is a 68 y.o. male, with medical history significant of history significant ofhypertension, diabetes mellitus, asthma,sCHFwith EF 45%, CAD, atrial fibrillation on Coumadin,AS, s/p ofAVR with mechanical valveon coumadin, OSA on CPAP, CKD stage III, depression with anxiety, who presents with shortness of breath , and chest pain, as well he reports worsening left great toe ulcer with surrounding cellulitis -Patient reports he started to complain of chest pressure over last 48 hours, accompanied by dyspnea, mainly upon exertion, he did take sublingual nitro, with improvement of his chest pain, but today reported did not have any significant event on his chest pain, patient with known mechanical aortic valve insufficiency, plan to have urgent cath by Dr. Claiborne Billings next week, for anticipated aortic valve replacement by CT surgery next month. -Patient was noted to have left lower extremity diabetic great toe ulcer, report is been going on for last 2 weeks, with some drainage, as well with surrounding cellulitis, he denies any fever, chills, he did not seek any medical care for it. - in ED patient was hypertensive, mildly tachypneic, retinae elevated at 2.1 from baseline 1.8, mild leukocytosis at 14 point 9K, high-sensitivity troponins were elevated at 96>99, INR supratherapeutic at 3.9, COVID-19 is negative, EKG showing A. fib, patient was seen by cardiology for chest pain, and hospitalist were requested to admit for multiple  medical problems, with a presentation of infected diabetic ulcer.    Review of systems:    In addition to the HPI above,  No Fever-chills, No Headache, No changes with Vision or hearing, No problems swallowing food or Liquids, Complains of chest pain, shortness of breath, he denies cough No Abdominal pain, No Nausea or Vommitting, Bowel movements are regular, No Blood in stool or Urine, No dysuria, Reports left great toe ulcer for last 2 weeks No new joints pains-aches,  No new weakness, tingling, numbness in any extremity, No recent weight gain or loss, No polyuria, polydypsia or polyphagia, No significant Mental Stressors.  A full 10 point Review of Systems was done, except as stated above, all other Review of Systems were negative.   With Past History of the following :    Past Medical History:  Diagnosis Date   Anxiety    Aortic stenosis    Arthritis    Asthma    Asymptomatic LV dysfunction, EF 45%, normal coronary arteries on cardiac cath 09/18/12 09/30/2012   CHF (congestive heart failure) (HCC)    Chronic  anticoagulation, on Xarelto prior to admit 09/30/2012   DM (diabetes mellitus) ([place]) 09/30/2012   Hepatitis 2003   Hypertension    Myocardial infarction [facility])    Permanent atrial fibrillation, since 1994 09/30/2012   stress test 02/08/12- normal study, no significant ischemia   S/P AVR (aortic valve replacement), 09/30/2012   a. s/p mechcanical AVR in 09/2012 (on Coumadin)   Sleep apnea    uses CPAP      Past Surgical History:  Procedure Laterality Date   ABDOMINAL ANGIOGRAM  09/18/2012   Procedure: ABDOMINAL ANGIOGRAM;  Surgeon: Troy Sine, MD;  Location: [facility] CATH LAB;  Service: Cardiovascular;;   AORTIC VALVE REPLACEMENT  09/29/2012   Procedure: AORTIC VALVE REPLACEMENT (AVR);  Surgeon: Gaye Pollack, MD;  Location: [facility];  Service: Open Heart Surgery;  Laterality: N/A;   ARCH AORTOGRAM  09/18/2012   Procedure: ARCH AORTOGRAM;   Surgeon: Troy Sine, MD;  Location: [facility] CATH LAB;  Service: Cardiovascular;;   CARDIAC CATHETERIZATION  09/18/12   severe calcific aortic stenosis, peak gradient 66mm, mean gradient 53mm, EF 45%   CARDIAC VALVE REPLACEMENT     AVR 09-29-12   CHOLECYSTECTOMY     FRACTURE SURGERY     LEFT AND RIGHT HEART CATHETERIZATION WITH CORONARY ANGIOGRAM N/A 09/18/2012   Procedure: LEFT AND RIGHT HEART CATHETERIZATION WITH CORONARY ANGIOGRAM;  Surgeon: Troy Sine, MD;  Location: [facility] CATH LAB;  Service: Cardiovascular;  Laterality: N/A;   TEE WITHOUT CARDIOVERSION N/A 05/29/2019   Procedure: TRANSESOPHAGEAL ECHOCARDIOGRAM (TEE);  Surgeon: Lelon Perla, MD;  Location: [facility] ENDOSCOPY;  Service: Cardiovascular;  Laterality: N/A;      Social History:     Social History   Tobacco Use   Smoking status: Former Smoker    Packs/day: 0.25    Years: 10.00    Pack years: 2.50    Types: Cigarettes    Quit date: 09/25/1982    Years since quitting: 36.7   Smokeless tobacco: Former Systems developer  Substance Use Topics   Alcohol use: Yes    Comment: occ. beer/wine     Lives -at home  Mobility -independent    Family History :     Family History  Problem Relation Age of Onset   Hypertension Mother    Diabetes Father    Heart attack Brother 12   Hyperlipidemia Brother 5       stents placed     Home Medications:   Prior to Admission medications   Medication Sig Start Date End Date Taking? Authorizing Provider  acetaminophen (TYLENOL) 325 MG tablet Take 2 tablets (650 mg total) by mouth every 4 (four) hours as needed for pain or fever. 10/16/12   Erlene Quan, PA-C  albuterol (PROVENTIL HFA;VENTOLIN HFA) 108 (90 BASE) MCG/ACT inhaler Inhale 2 puffs into the lungs every 6 (six) hours as needed for wheezing or shortness of breath.     [provider]  Ascorbic Acid (VITAMIN C) 1000 MG tablet Take 1,000 mg by mouth every evening.     [provider]  B-D ULTRAFINE  III SHORT PEN 31G X 8 MM MISC  10/04/13   [provider]  cholecalciferol (VITAMIN D) 1000 UNITS tablet Take 1,000 Units by mouth daily.     [provider]  digoxin (LANOXIN) 0.125 MG tablet TAKE 1 TABLET EVERY DAY Patient taking differently: Take 0.125 mg by mouth at bedtime.  01/21/19   Troy Sine, MD  EASY TOUCH LANCETS 30G/TWIST [place]  11/05/13   [provider]  empagliflozin (JARDIANCE) 25 MG TABS tablet Take 25 mg by mouth daily. 04/20/19   Troy Sine, MD  escitalopram (LEXAPRO) 10 MG tablet Take 10 mg by mouth every evening.     [provider]  ezetimibe (ZETIA) 10 MG tablet Take 1 tablet (10 mg total) by mouth daily. Patient taking differently: Take 10 mg by mouth at bedtime. Refilled and taking as prescribed 02/09/19 06/12/19  Almyra Deforest, PA  fenofibrate 160 MG tablet Take 1 tablet (160 mg total) by mouth daily. 05/16/19   Black, Lezlie Octave, NP  Fluticasone-Salmeterol (ADVAIR) 250-50 MCG/DOSE AEPB Inhale 1 puff into the lungs as needed (shortness of breath). For asthma related symptoms    [provider]  glimepiride (AMARYL) 2 MG tablet Take 2 mg by mouth daily with breakfast.  05/25/17   [provider]  Glucosamine-Chondroit-Vit C-Mn (GLUCOSAMINE 1500 COMPLEX PO) Take 1,500 mg by mouth every evening.     [provider]  hydrALAZINE (APRESOLINE) 25 MG tablet Take 0.5 tablets (12.5 mg total) by mouth 2 (two) times a day. 06/03/19 09/01/19  Troy Sine, MD  Icosapent Ethyl (VASCEPA) 1 g CAPS Take 2 capsules (2 g total) by mouth 2 (two) times daily. 04/20/19   Troy Sine, MD  insulin glargine (LANTUS) 100 UNIT/ML injection Inject 20-35 Units into the skin 2 (two) times daily. Depending on Blood sugar reading    [provider]  isosorbide mononitrate (IMDUR) 30 MG 24 hr tablet Take 1 tablet (30 mg total) by mouth daily. 06/03/19 09/01/19  Troy Sine, MD  metoprolol succinate (TOPROL XL) 25 MG 24 hr tablet Take  1.5 tablets (37.5 mg total) by mouth 2 (two) times a day. 05/15/19   Black, Lezlie Octave, NP  Multiple Vitamins-Minerals (CENTRUM SPECIALIST HEART PO) Take 1 tablet by mouth daily.     [provider]  nitroGLYCERIN (NITROSTAT) 0.4 MG SL tablet Place 1 tablet (0.4 mg total) under the tongue every 5 (five) minutes as needed for chest pain. 05/30/19   Guilford Shi, MD  saccharomyces boulardii (FLORASTOR) 250 MG capsule Take 250 mg by mouth 3 (three) times a week.    [provider]  torsemide (DEMADEX) 20 MG tablet Take 1 tablet (20 mg total) by mouth 2 (two) times daily. 05/30/19   Guilford Shi, MD  warfarin (COUMADIN) 10 MG tablet Take 10 mg by mouth See admin instructions. Take on 10mg  on  Sun. Mon. Wed. Fri.    [provider]  warfarin (COUMADIN) 5 MG tablet Take as instructed with 10 mg. Patient taking differently: Take 2.5 mg by mouth See admin instructions. Take 1/2 tablet (2.5mg ) with 10 mg tablet to get a total of 12.5 on Tues, Thurs and Saturday 12/11/18   Troy Sine, MD     Allergies:     Allergies  Allergen Reactions   Iohexol Anaphylaxis   Niacin And Related     Flushing with immediate realese   Penicillins Other (See Comments)    Unknown.[name]  Did it involve swelling of the face/tongue/throat, SOB, or low BP? Unknown Did it involve sudden or severe rash/hives, skin peeling, or any reaction on the inside of your mouth or nose? Unknown Did you need to seek medical attention at a hospital or doctor's office? Unknown When did it last happen?Childhood If all above answers are NO, may proceed with cephalosporin use.     Physical Exam:   Vitals  Blood pressure (!) 123/59, pulse 70, temperature 98 F (36.7 C), temperature source Oral, resp. rate 16, height 5\' 11"  (1.803 m), weight 113.2 kg, SpO2 98 %.   1. General well developed male, laying in bed, in no apparent distress  2. Normal affect and insight, Not  Suicidal or Homicidal, Awake Alert, Oriented X 3.  3. No F.N deficits, ALL C.Nerves Intact, Strength 5/5 all 4 extremities, Sensation intact all 4 extremities, Plantars down going.  4. Ears and Eyes appear Normal, Conjunctivae clear, PERRLA. Moist Oral Mucosa.  5. Supple Neck,+ JVD, No cervical lymphadenopathy appriciated, No Carotid Bruits.  6. Symmetrical Chest wall movement, diminished air movement bilaterally at the bases, no wheezing rales or rhonchi  7.  Irregular irregular, has mechanical click, no rubs, no parasternal heaves no Gallops,   8. Positive Bowel Sounds, Abdomen Soft, No tenderness, No organomegaly appriciated,No rebound -guarding or rigidity.  9.  No Cyanosis, Normal Skin Turgor, strength with left great toe lateral diabetic ulcer, superficial, no foul smell, with surrounding cellulitis up to above ankle area  10. Good muscle tone,  joints appear normal , no effusions, Normal ROM.  11. No Palpable Lymph Nodes in Neck or Axillae    Data Review:    CBC Recent Labs  Lab 06/26/19 1315  WBC 14.9*  HGB 13.0  HCT 41.1  PLT 240  MCV 81.9  MCH 25.9*  MCHC 31.6  RDW 17.2*   ------------------------------------------------------------------------------------------------------------------  Chemistries  Recent Labs  Lab 06/26/19 1315  NA 134*  K 4.5  CL 98  CO2 21*  GLUCOSE 227*  BUN 46*  CREATININE 2.12*  CALCIUM 9.7   ------------------------------------------------------------------------------------------------------------------ estimated creatinine clearance is 41.5 mL/min (A) (by C-G formula based on SCr of 2.12 mg/dL (H)). ------------------------------------------------------------------------------------------------------------------ No results for input(s): TSH, T4TOTAL, T3FREE, THYROIDAB in the last 72 hours.  Invalid input(s): FREET3  Coagulation profile Recent Labs  Lab 06/26/19 1315  INR 3.9*    ------------------------------------------------------------------------------------------------------------------- No results for input(s): DDIMER in the last 72 hours. -------------------------------------------------------------------------------------------------------------------  Cardiac Enzymes No results for input(s): CKMB, TROPONINI, MYOGLOBIN in the last 168 hours.  Invalid input(s): CK ------------------------------------------------------------------------------------------------------------------    Component Value Date/Time   BNP 669.2 (H) 06/26/2019 1315   BNP 79.7 08/24/2013 1023     ---------------------------------------------------------------------------------------------------------------  Urinalysis    Component Value Date/Time   COLORURINE YELLOW 09/25/2012 1506   APPEARANCEUR CLEAR 09/25/2012 1506   LABSPEC 1.045 (H) 09/25/2012 1506   PHURINE 6.0 09/25/2012 1506   GLUCOSEU >1000 (A) 09/25/2012 1506   HGBUR NEGATIVE 09/25/2012 1506   BILIRUBINUR NEGATIVE 09/25/2012 1506   KETONESUR NEGATIVE 09/25/2012 1506   PROTEINUR NEGATIVE 09/25/2012 1506   UROBILINOGEN 0.2 09/25/2012 1506   NITRITE NEGATIVE 09/25/2012 1506   LEUKOCYTESUR NEGATIVE 09/25/2012 1506    ----------------------------------------------------------------------------------------------------------------   Imaging Results:    Dg Chest Portable 1 View  Result Date: 06/26/2019 CLINICAL DATA:  Shortness of breath for 3 days. EXAM: PORTABLE CHEST 1 VIEW COMPARISON:  05/27/2019 and older exams. FINDINGS: Stable changes from prior cardiac surgery. Cardiac silhouette is mildly enlarged. No mediastinal or hilar masses. Lungs show prominent bronchovascular markings similar to the prior study. No evidence of pneumonia. No convincing pleural effusion and no pneumothorax. Skeletal structures are grossly intact. IMPRESSION: 1. Cardiomegaly with prominent bronchovascular markings but no overt pulmonary  edema. No evidence of pneumonia. Electronically Signed   By: Lajean Manes M.D.   On: 06/26/2019 13:42    My personal review of EKG: Showing atrial fibrillation, of 80, with  right bundle branch block, T wave inversion in lateral leads   Assessment & Plan:    Active Problems:   Chronic anticoagulation   AS (aortic stenosis)   Essential hypertension   Acute combined systolic and diastolic heart failure (HCC)   CKD (chronic kidney disease), stage III (HCC)   Acute on chronic systolic (congestive) heart failure (HCC)   Depression   Foot ulcer, left (HCC)   Infected diabetic foot ulcer with surrounding cellulitis. -Ongoing for last 2 week, superficial, no suspicion for osteomyelitis, will start on broad-spectrum antibiotic coverage after obtaining blood culture(especially he well have cardiac valve surgery in couple weeks), will obtain ABI to evaluate for circulation, for now we will continue with mycin, Rocephin and Flagyl, can narrow antibiotics and DC Vanco if MRSA screening and cultures are negative.  Chest pain/shortness of breath -This is most likely related to CHF and mechanical aortic valve insufficiency. -Cardiology input greatly appreciated, patient is planned for cardiac cath next week. -[place] on warfarin with supratherapeutic INR, 3.9, will need to initiate heparin GTT per pharmacy when his INR is less than 2.5  acute on chronic biventricular combined CHF. -Management per cardiology, TEE 05/2019 showing EF 30 to 35%. -IV diuresis per cardiology, he is to receive IV Lasix 80 mg twice daily x2 doses, and then to be reassessed in a.m. depends on renal function and volume status. - Monitor electrolytes close and replete to maintain K >4, Mg >2.   S/P AVR, 09/29/12, [employer]. (discharged 10/05/12), with current severe aortic insufficiency . -Continue with full anticoagulation -Reviewed aortic insufficiency noted in TEE last month, patient undergoing work-up for redo AVR by Dr.  Mohammed Kindle next week. Selena Batten cath was part of work-up before surgery planned 7/30, given chest pain currently, a plan on  earlier date, to be determined by cardiology.  AKI and CKD stage III -Baseline creatinine 1.8, it is 2.1 today, monitor closely as on IV diuresis    Permanent atrial fibrillation, since 1994: -CHA2DS2-VASc Scoreis 5, on  oral anticoagulation.  -continue  metoprolol and digoxin  Sleep apnea: -on C-pap  Dyslipidemia, goal LDL below 70: -[place] and zetia  HTN:  -Continue home medications:Metoprolol -Patient is on IV Lasix  Type II diabetes mellitus with renal manifestations ([place]): - Last A1c8.7 on 05/13/19, poorly controled. Patient is takingLantus, Jardiance, Victoza, Amarylat home -Continue Lantus at a lower dose 25 units twice daily, and insulin sliding scale   Depression:  Continue home medications    DVT Prophylaxis warfarin  AM Labs Ordered, also please review Full Orders  Family Communication: Admission, patients condition and plan of care including tests being ordered have been discussed with the patient who indicate understanding and agree with the plan and Code Status.  Code Status Full  Likely DC to  Home  Condition GUARDED    Consults called:  cardiology  Admission status: inpatient  Time spent in minutes : 65 minutes   Phillips Climes M.D on 06/26/2019 at 5:28 PM  Between 7am to 7pm - Pager - [phone]. After 7pm go to [email or web address] - password [facility]  [facility] - Office  [phone]

## 2019-06-26 NOTE — ED Triage Notes (Signed)
Pt endorses CP with shob x 3 days with diaphoresis and weakness. Pt diaphoretic and shob in triage. Supposed to have catheterization and open heart surgery in near future due to leaking mechanical valve. VSS.

## 2019-06-26 NOTE — ED Notes (Addendum)
[phone]  House phone -- Kieth Brightly  [phone]  Cell phone (call first)

## 2019-06-26 NOTE — ED Notes (Signed)
ED TO INPATIENT HANDOFF REPORT  ED Nurse Name and Phone #: Annie Main [phone]  T Name/Age/Gender Joseph Hoffman 68 y.o. male Room/Bed: 040C/040C  Code Status   Code Status: Prior  Home/SNF/Other Home Patient oriented to: self, place, time and situation Is this baseline? Yes   Triage Complete: Triage complete  Chief Complaint SOB, chest discomfort  Triage Note Pt endorses CP with shob x 3 days with diaphoresis and weakness. Pt diaphoretic and shob in triage. Supposed to have catheterization and open heart surgery in near future due to leaking mechanical valve. VSS.    Allergies Allergies  Allergen Reactions  . Iohexol Anaphylaxis  . Niacin And Related     Flushing with immediate realese  . Penicillins Other (See Comments)    Unknown.[name]  Did it involve swelling of the face/tongue/throat, SOB, or low BP? Unknown Did it involve sudden or severe rash/hives, skin peeling, or any reaction on the inside of your mouth or nose? Unknown Did you need to seek medical attention at a hospital or doctor's office? Unknown When did it last happen?Childhood If all above answers are "NO", may proceed with cephalosporin use.    Level of Care/Admitting Diagnosis ED Disposition    ED Disposition Condition [facility] Area: [place] [[id number]]  Level of Care: Telemetry Cardiac [103]  Covid Evaluation: Confirmed COVID Negative  Diagnosis: Foot ulcer, left [facility]) [[id number]]  Admitting Physician: Manfred Shirts  Attending Physician: Waldron Labs, DAWOOD S [[id number]]  Estimated length of stay: 3 - 4 days  Certification:: I certify this patient will need inpatient services for at least 2 midnights  PT Class (Do Not Modify): Inpatient [101]  PT Acc Code (Do Not Modify): Private [1]       B Medical/Surgery History Past Medical History:  Diagnosis Date  . Anxiety   . Aortic stenosis   . Arthritis   . Asthma   . Asymptomatic LV  dysfunction, EF 45%, normal coronary arteries on cardiac cath 09/18/12 09/30/2012  . CHF (congestive heart failure) ([place])   . Chronic anticoagulation, on Xarelto prior to admit 09/30/2012  . DM (diabetes mellitus) ([place]) 09/30/2012  . Hepatitis 2003  . Hypertension   . Myocardial infarction ([place])   . Permanent atrial fibrillation, since 1994 09/30/2012   stress test 02/08/12- normal study, no significant ischemia  . S/P AVR (aortic valve replacement), 09/30/2012   a. s/p mechcanical AVR in 09/2012 (on Coumadin)  . Sleep apnea    uses CPAP   Past Surgical History:  Procedure Laterality Date  . ABDOMINAL ANGIOGRAM  09/18/2012   Procedure: ABDOMINAL ANGIOGRAM;  Surgeon: Troy Sine, MD;  Location: [facility] CATH LAB;  Service: Cardiovascular;;  . AORTIC VALVE REPLACEMENT  09/29/2012   Procedure: AORTIC VALVE REPLACEMENT (AVR);  Surgeon: Gaye Pollack, MD;  Location: [place];  Service: Open Heart Surgery;  Laterality: N/A;  . ARCH AORTOGRAM  09/18/2012   Procedure: ARCH AORTOGRAM;  Surgeon: Troy Sine, MD;  Location: [facility] CATH LAB;  Service: Cardiovascular;;  . CARDIAC CATHETERIZATION  09/18/12   severe calcific aortic stenosis, peak gradient 26mm, mean gradient 72mm, EF 45%  . CARDIAC VALVE REPLACEMENT     AVR 09-29-12  . CHOLECYSTECTOMY    . FRACTURE SURGERY    . LEFT AND RIGHT HEART CATHETERIZATION WITH CORONARY ANGIOGRAM N/A 09/18/2012   Procedure: LEFT AND RIGHT HEART CATHETERIZATION WITH CORONARY ANGIOGRAM;  Surgeon: Troy Sine, MD;  Location: [facility] CATH  LAB;  Service: Cardiovascular;  Laterality: N/A;  . TEE WITHOUT CARDIOVERSION N/A 05/29/2019   Procedure: TRANSESOPHAGEAL ECHOCARDIOGRAM (TEE);  Surgeon: Lelon Perla, MD;  Location: [facility] ENDOSCOPY;  Service: Cardiovascular;  Laterality: N/A;     A IV Location/Drains/Wounds Patient Lines/Drains/Airways Status   Active Line/Drains/Airways    Name:   Placement date:   Placement time:   Site:   Days:   Peripheral IV 06/26/19 Left  Forearm   06/26/19    1320    Forearm   less than 1   Wound / Incision (Open or Dehisced) 05/28/19 Diabetic ulcer Toe (Comment  which one) Left;Anterior yellow slough 2.5 cm x 2.5 cm   05/28/19    0540    Toe (Comment  which one)   29          Intake/Output Last 24 hours No intake or output data in the 24 hours ending 06/26/19 1548  Labs/Imaging Results for orders placed or performed during the hospital encounter of 06/26/19 (from the past 48 hour(s))  Type and screen [place]     Status: None   Collection Time: 06/26/19  1:14 PM  Result Value Ref Range   ABO/RH(D) A POS    Antibody Screen NEG    Sample Expiration      06/29/2019,2359 Performed at [facility], [place] [address], [place], [place] [address]   Basic metabolic panel     Status: Abnormal   Collection Time: 06/26/19  1:15 PM  Result Value Ref Range   Sodium 134 (L) 135 - 145 mmol/L   Potassium 4.5 3.5 - 5.1 mmol/L   Chloride 98 98 - 111 mmol/L   CO2 21 (L) 22 - 32 mmol/L   Glucose, Bld 227 (H) 70 - 99 mg/dL   BUN 46 (H) 8 - 23 mg/dL   Creatinine, Ser 2.12 (H) 0.61 - 1.24 mg/dL   Calcium 9.7 8.9 - 10.3 mg/dL   GFR calc non Af Amer 31 (L) >60 mL/min   GFR calc Af Amer 35 (L) >60 mL/min   Anion gap 15 5 - 15    Comment: Performed at [facility] [address], [place], [place] [address]  CBC     Status: Abnormal   Collection Time: 06/26/19  1:15 PM  Result Value Ref Range   WBC 14.9 (H) 4.0 - 10.5 K/uL   RBC 5.02 4.22 - 5.81 MIL/uL   Hemoglobin 13.0 13.0 - 17.0 g/dL   HCT 41.1 39.0 - 52.0 %   MCV 81.9 80.0 - 100.0 fL   MCH 25.9 (L) 26.0 - 34.0 pg   MCHC 31.6 30.0 - 36.0 g/dL   RDW 17.2 (H) 11.5 - 15.5 %   Platelets 240 150 - 400 K/uL   nRBC 0.0 0.0 - 0.2 %    Comment: Performed at [facility], [place] [address], [place], [place] [address]  Troponin I (High Sensitivity)     Status: Abnormal   Collection Time: 06/26/19  1:15 PM  Result Value Ref Range   Troponin I (High  Sensitivity) 96 (H) <18 ng/L    Comment: (NOTE) Elevated high sensitivity troponin I (hsTnI) values and significant  changes across serial measurements may suggest ACS but many other  chronic and acute conditions are known to elevate hsTnI results.  Refer to the "Links" section for chest pain algorithms and additional  guidance. Performed at [facility], [place] [address] [place], [place] [address]  Brain natriuretic peptide     Status: Abnormal   Collection Time: 06/26/19  1:15 PM  Result Value Ref Range   B Natriuretic Peptide 669.2 (H) 0.0 - 100.0 pg/mL    Comment: Performed at [facility] [address] [place], [place] [address]  SARS Coronavirus 2 (CEPHEID - Performed in [place] hospital lab), Hosp Order     Status: None   Collection Time: 06/26/19  1:15 PM   Specimen: Nasopharyngeal Swab  Result Value Ref Range   SARS Coronavirus 2 NEGATIVE NEGATIVE    Comment: (NOTE) If result is NEGATIVE SARS-CoV-2 target nucleic acids are NOT DETECTED. The SARS-CoV-2 RNA is generally detectable in upper and lower  respiratory specimens during the acute phase of infection. The lowest  concentration of SARS-CoV-2 viral copies this assay can detect is 250  copies / mL. A negative result does not preclude SARS-CoV-2 infection  and should not be used as the sole basis for treatment or other  patient management decisions.  A negative result may occur with  improper specimen collection / handling, submission of specimen other  than nasopharyngeal swab, presence of viral mutation(s) within the  areas targeted by this assay, and inadequate number of viral copies  (<250 copies / mL). A negative result must be combined with clinical  observations, patient history, and epidemiological information. If result is POSITIVE SARS-CoV-2 target nucleic acids are DETECTED. The SARS-CoV-2 RNA is generally detectable in upper and lower  respiratory specimens dur ing the acute phase of  infection.  Positive  results are indicative of active infection with SARS-CoV-2.  Clinical  correlation with patient history and other diagnostic information is  necessary to determine patient infection status.  Positive results do  not rule out bacterial infection or co-infection with other viruses. If result is PRESUMPTIVE POSTIVE SARS-CoV-2 nucleic acids MAY BE PRESENT.   A presumptive positive result was obtained on the submitted specimen  and confirmed on repeat testing.  While 2019 novel coronavirus  (SARS-CoV-2) nucleic acids may be present in the submitted sample  additional confirmatory testing may be necessary for epidemiological  and / or clinical management purposes  to differentiate between  SARS-CoV-2 and other Sarbecovirus currently known to infect humans.  If clinically indicated additional testing with an alternate test  methodology [phone]) is advised. The SARS-CoV-2 RNA is generally  detectable in upper and lower respiratory sp ecimens during the acute  phase of infection. The expected result is Negative. Fact Sheet for Patients:  StrictlyIdeas.no Fact Sheet for Healthcare Providers: BankingDealers.co.za This test is not yet approved or cleared by the [place] FDA and has been authorized for detection and/or diagnosis of SARS-CoV-2 by FDA under an Emergency Use Authorization (EUA).  This EUA will remain in effect (meaning this test can be used) for the duration of the COVID-19 declaration under Section 564(b)(1) of the Act, 21 U.S.C. section 360bbb-3(b)(1), unless the authorization is terminated or revoked sooner. Performed at [facility], [place] [address], [place], [place] [address]   Protime-INR     Status: Abnormal   Collection Time: 06/26/19  1:15 PM  Result Value Ref Range   Prothrombin Time 37.3 (H) 11.4 - 15.2 seconds   INR 3.9 (H) 0.8 - 1.2    Comment: (NOTE) INR goal varies based on device and  disease states. Performed at [facility], [place] [address], [place], [place] [address]   Digoxin level     Status: Abnormal   Collection  Time: 06/26/19  1:15 PM  Result Value Ref Range   Digoxin Level 0.3 (L) 0.8 - 2.0 ng/mL    Comment: Performed at [facility], [place] [address], [place], [place] [address]  CBG monitoring, ED     Status: Abnormal   Collection Time: 06/26/19  1:37 PM  Result Value Ref Range   Glucose-Capillary 219 (H) 70 - 99 mg/dL   Comment 1 Notify RN    Comment 2 Document in Chart   Lactic acid, plasma     Status: None   Collection Time: 06/26/19  2:24 PM  Result Value Ref Range   Lactic Acid, Venous 1.3 0.5 - 1.9 mmol/L    Comment: Performed at [facility], [place] [address], [place], [place] [address]  Troponin I (High Sensitivity)     Status: Abnormal   Collection Time: 06/26/19  2:46 PM  Result Value Ref Range   Troponin I (High Sensitivity) 99 (H) <18 ng/L    Comment: (NOTE) Elevated high sensitivity troponin I (hsTnI) values and significant  changes across serial measurements may suggest ACS but many other  chronic and acute conditions are known to elevate hsTnI results.  Refer to the "Links" section for chest pain algorithms and additional  guidance. Performed at [facility], [place] [address], [place], [place] [address]    Dg Chest Portable 1 View  Result Date: 06/26/2019 CLINICAL DATA:  Shortness of breath for 3 days. EXAM: PORTABLE CHEST 1 VIEW COMPARISON:  05/27/2019 and older exams. FINDINGS: Stable changes from prior cardiac surgery. Cardiac silhouette is mildly enlarged. No mediastinal or hilar masses. Lungs show prominent bronchovascular markings similar to the prior study. No evidence of pneumonia. No convincing pleural effusion and no pneumothorax. Skeletal structures are grossly intact. IMPRESSION: 1. Cardiomegaly with prominent bronchovascular markings but no overt pulmonary edema. No evidence of pneumonia. Electronically  Signed   By: Lajean Manes M.D.   On: 06/26/2019 13:42    Pending Labs Unresulted Labs (From admission, onward)    Start     Ordered   06/26/19 1425  Culture, blood (routine x 2)  BLOOD CULTURE X 2,   STAT     06/26/19 1428   06/26/19 1424  Lactic acid, plasma  Now then every 2 hours,   STAT     06/26/19 1428   Signed and Held  Basic metabolic panel  Tomorrow morning,   R     Signed and Held   Signed and Held  CBC  Tomorrow morning,   R     Signed and Held   Signed and Held  Protime-INR  Tomorrow morning,   R     Signed and Held          Vitals/Pain Today's Vitals   06/26/19 1400 06/26/19 1430 06/26/19 1445 06/26/19 1455  BP: (!) 138/98  132/66   Pulse: (!) 59  79   Resp:  18 (!) 25   Temp:      TempSrc:      SpO2: 97%  97%   Weight:      Height:      PainSc:    0-No pain    Isolation Precautions No active isolations  Medications Medications  cefTRIAXone (ROCEPHIN) 2 g in sodium chloride 0.9 % 100 mL IVPB (has no administration in time range)  metroNIDAZOLE (FLAGYL) IVPB 500 mg (has no administration in time range)  vancomycin (VANCOCIN) 2,500 mg in sodium chloride 0.9 % 500 mL IVPB (  has no administration in time range)  vancomycin (VANCOCIN) IVPB 1000 mg/200 mL premix (has no administration in time range)  furosemide (LASIX) injection 80 mg (has no administration in time range)    Mobility walks with person assist Low fall risk   Focused Assessments Cardiac Assessment Handoff:  Cardiac Rhythm: Atrial fibrillation Lab Results  Component Value Date   TROPONINI 0.11 ([place]) 05/13/2019   No results found for: DDIMER Does the Patient currently have chest pain? No     R Recommendations: See Admitting Provider Note  Report given to:   Additional Notes:

## 2019-06-26 NOTE — Telephone Encounter (Signed)
Spoke with pt's wife who report pt is having c/o of left sided chest discomfort, chills, weakness,and SOB for the past 2 days. She report pt has taken nitroglycerin but symptoms has not resolved. Wife instructed to pt to ER for further evaluations. Wife verbalized understanding. Cardmaster notified

## 2019-06-26 NOTE — Progress Notes (Signed)
Pharmacy Antibiotic Note  Joseph Hoffman is a 68 y.o. male admitted on 06/26/2019 with diabetic foot infection. Pt reports SOB x3 days w/ diaphoresis and weakness. Tmax 98 and WBC 14.9. Pharmacy has been consulted for vancomycin dosing.  Plan: Vancomycin 2500mg  IV x1 then vancomycin 1000mg  IV q24h Monitor renal function and clinical progression F/u C&S  Vancomycin Goal AUC 400-550. Expected AUC: 534.4 SCr used: 2.12  Height: 5\' 11"  (180.3 cm) Weight: 249 lb 8 oz (113.2 kg) IBW/kg (Calculated) : 75.3  Temp (24hrs), Avg:98 F (36.7 C), Min:98 F (36.7 C), Max:98 F (36.7 C)  Recent Labs  Lab 06/26/19 1315  WBC 14.9*  CREATININE 2.12*    Estimated Creatinine Clearance: 41.5 mL/min (A) (by C-G formula based on SCr of 2.12 mg/dL (H)).    Allergies  Allergen Reactions  . Iohexol Anaphylaxis  . Niacin And Related     Flushing with immediate realese  . Penicillins Other (See Comments)    Unknown.[name]  Did it involve swelling of the face/tongue/throat, SOB, or low BP? Unknown Did it involve sudden or severe rash/hives, skin peeling, or any reaction on the inside of your mouth or nose? Unknown Did you need to seek medical attention at a hospital or doctor's office? Unknown When did it last happen?Childhood If all above answers are "NO", may proceed with cephalosporin use.    Antimicrobials this admission: Ceftriaxone 7/24 >>  Vancomycin 7/24 >> Metronidazole 7/24 >>   Dose adjustments this admission: n/a  Microbiology results: Pending  Thank you for allowing pharmacy to be a part of this patient's care.  Natale Lay 06/26/2019 3:31 PM

## 2019-06-26 NOTE — Progress Notes (Signed)
Bilateral ABIs and TBIs completed. Preliminary results in Chart review CV Proc. [facility], [place] 06/26/2019, 7:13 PM

## 2019-06-27 ENCOUNTER — Inpatient Hospital Stay (HOSPITAL_COMMUNITY): Admission: RE | Admit: 2019-06-27 | Payer: Medicare Other | Source: Ambulatory Visit

## 2019-06-27 DIAGNOSIS — N183 Chronic kidney disease, stage 3 (moderate): Secondary | ICD-10-CM

## 2019-06-27 DIAGNOSIS — I4821 Permanent atrial fibrillation: Secondary | ICD-10-CM

## 2019-06-27 DIAGNOSIS — R072 Precordial pain: Secondary | ICD-10-CM

## 2019-06-27 DIAGNOSIS — Z952 Presence of prosthetic heart valve: Secondary | ICD-10-CM

## 2019-06-27 DIAGNOSIS — N179 Acute kidney failure, unspecified: Secondary | ICD-10-CM

## 2019-06-27 LAB — CBC
HCT: 43.1 % (ref 39.0–52.0)
Hemoglobin: 13.2 g/dL (ref 13.0–17.0)
MCH: 25.7 pg — ABNORMAL LOW (ref 26.0–34.0)
MCHC: 30.6 g/dL (ref 30.0–36.0)
MCV: 83.9 fL (ref 80.0–100.0)
Platelets: 266 10*3/uL (ref 150–400)
RBC: 5.14 MIL/uL (ref 4.22–5.81)
RDW: 17.2 % — ABNORMAL HIGH (ref 11.5–15.5)
WBC: 12.4 10*3/uL — ABNORMAL HIGH (ref 4.0–10.5)
nRBC: 0 % (ref 0.0–0.2)

## 2019-06-27 LAB — GLUCOSE, CAPILLARY
Glucose-Capillary: 169 mg/dL — ABNORMAL HIGH (ref 70–99)
Glucose-Capillary: 184 mg/dL — ABNORMAL HIGH (ref 70–99)
Glucose-Capillary: 353 mg/dL — ABNORMAL HIGH (ref 70–99)
Glucose-Capillary: 375 mg/dL — ABNORMAL HIGH (ref 70–99)

## 2019-06-27 LAB — BASIC METABOLIC PANEL
Anion gap: 14 (ref 5–15)
BUN: 45 mg/dL — ABNORMAL HIGH (ref 8–23)
CO2: 25 mmol/L (ref 22–32)
Calcium: 9.2 mg/dL (ref 8.9–10.3)
Chloride: 96 mmol/L — ABNORMAL LOW (ref 98–111)
Creatinine, Ser: 2.19 mg/dL — ABNORMAL HIGH (ref 0.61–1.24)
GFR calc Af Amer: 34 mL/min — ABNORMAL LOW (ref >60)
GFR calc non Af Amer: 29 mL/min — ABNORMAL LOW (ref >60)
Glucose, Bld: 170 mg/dL — ABNORMAL HIGH (ref 70–99)
Potassium: 3.9 mmol/L (ref 3.5–5.1)
Sodium: 135 mmol/L (ref 135–145)

## 2019-06-27 LAB — PROTIME-INR
INR: 3.4 — ABNORMAL HIGH (ref 0.8–1.2)
Prothrombin Time: 34 seconds — ABNORMAL HIGH (ref 11.4–15.2)

## 2019-06-27 MED ORDER — BOOST / RESOURCE BREEZE PO LIQD CUSTOM
1.0000 | Freq: Three times a day (TID) | ORAL | Status: DC
  Administered 2019-06-27 – 2019-07-01 (×8): 1 via ORAL

## 2019-06-27 MED ORDER — SENNOSIDES-DOCUSATE SODIUM 8.6-50 MG PO TABS
2.0000 | ORAL_TABLET | Freq: Two times a day (BID) | ORAL | Status: DC
  Administered 2019-06-27 – 2019-07-06 (×3): 2 via ORAL
  Filled 2019-06-27 (×14): qty 2

## 2019-06-27 MED ORDER — HYDROMORPHONE HCL 1 MG/ML IJ SOLN
1.0000 mg | INTRAMUSCULAR | Status: DC | PRN
  Administered 2019-06-27 – 2019-07-08 (×3): 1 mg via INTRAVENOUS
  Filled 2019-06-27 (×3): qty 1

## 2019-06-27 MED ORDER — JUVEN PO PACK
1.0000 | PACK | Freq: Two times a day (BID) | ORAL | Status: DC
  Administered 2019-06-27 – 2019-07-06 (×12): 1 via ORAL
  Filled 2019-06-27 (×17): qty 1

## 2019-06-27 MED ORDER — ICOSAPENT ETHYL 1 G PO CAPS
2.0000 g | ORAL_CAPSULE | Freq: Two times a day (BID) | ORAL | Status: DC
  Administered 2019-06-27 – 2019-07-10 (×25): 2 g via ORAL
  Filled 2019-06-27 (×29): qty 2

## 2019-06-27 MED ORDER — PROSIGHT PO TABS
1.0000 | ORAL_TABLET | Freq: Every day | ORAL | Status: DC
  Administered 2019-06-27 – 2019-07-10 (×13): 1 via ORAL
  Filled 2019-06-27 (×13): qty 1

## 2019-06-27 MED ORDER — OXYCODONE HCL 5 MG PO TABS
5.0000 mg | ORAL_TABLET | Freq: Four times a day (QID) | ORAL | Status: DC | PRN
  Administered 2019-06-27 – 2019-07-08 (×8): 5 mg via ORAL
  Filled 2019-06-27 (×9): qty 1

## 2019-06-27 NOTE — Progress Notes (Signed)
Initial Nutrition Assessment  DOCUMENTATION CODES:   Obesity unspecified  INTERVENTION:  Patient with niacin allergy; contraindications for Ensure, Glucerna, MVI with minerals  -Boost Breeze po TID, each supplement provides 250 kcal and 9 grams of protein -1 packet Juven BID, each packet provides 95 calories, 2.5 grams of protein (collagen), and 9.8 grams of carbohydrate (3 grams sugar); also contains 7 grams of L-arginine and L-glutamine, 300 mg vitamin C, 15 mg vitamin E, 1.2 mcg vitamin B-12, 9.5 mg zinc, 200 mg calcium, and 1.5 g  Calcium Beta-hydroxy-Beta-methylbutyrate to support wound healing -Ocuvite po daily  NUTRITION DIAGNOSIS:   Increased nutrient needs related to wound healing(cellulitis; left leg, diabetic ulcer; left; great toe) as evidenced by estimated needs.   GOAL:   Patient will meet greater than or equal to 90% of their needs  MONITOR:   Skin, Supplement acceptance, PO intake, Labs, Weight trends  REASON FOR ASSESSMENT:   Consult Wound healing  ASSESSMENT:  RD working remotely.  68 year old male with past medical history significant of a-fib, chronic combined CHF, AVR in 2013, HTN, OSA on CPAP, T2DM, CKD3, and hepatitis. Pt admitted with LLE cellulitis and acute on chronic biventricular HF with severe aortic regurgitation  Per chart review; pt presented to ED with SOB and chest pain lasting 48hrs. Upon admission pt reports 2 weeks of worsening great toe ulcer with surrounding cellulitis that he did not seek care for.   7/24 ECG: a-fib with IVCD, left anterior fascicular  6/26 TEE: LVEF 30-35%; RV has severely reduced systolic function  Per cardiology -  plan for rt and left heart cathetrization early next week depending on degree of renal insufficiency; currently holding diuretics  Patient eating 100% of meals; Current diet order provides 1647 kcal and 86 grams of protein. RD to order ONS to aid patient in meeting estimated calorie and protein needs.    Weights reviewed - stable wt history Current wt 112.1kg with +2 RLE; +1 LLE   Intake/Output Summary (Last 24 hours) at 06/27/2019 1234 Last data filed at 06/27/2019 1127 Gross per 24 hour  Intake 240 ml  Output 3500 ml  Net -3260 ml    Medications reviewed and include: Vit D3, fenofibrate, SSI, Lantus 25 units BID, Imdur, Senokot, vit C, Flagyl, vancomycin, recephin  Labs: Glucose 170 (H) Lab Results  Component Value Date   HGBA1C 8.7 (H) 05/13/2019     NUTRITION - FOCUSED PHYSICAL EXAM: Unable to complete at this time   Diet Order:   Diet Order            Diet heart healthy/carb modified Room service appropriate? Yes; Fluid consistency: Thin  Diet effective now              EDUCATION NEEDS:   No education needs have been identified at this time  Skin:  Skin Assessment: Reviewed RN Assessment(cellultis; left; leg, diabetic ulcer; left; great toe)  Last BM:  7/24  Height:   Ht Readings from Last 1 Encounters:  06/26/19 5\' 11"  (1.803 m)    Weight:   Wt Readings from Last 1 Encounters:  06/27/19 112.1 kg    Ideal Body Weight:  78.2 kg  BMI:  Body mass index is 34.46 kg/m.  Estimated Nutritional Needs:   Kcal:  2200-2400  Protein:  120-130  Fluid:  >2.2L   Lajuan Lines, RD, LDN  After Hours/Weekend Pager: [phone]

## 2019-06-27 NOTE — Clinical Social Work Note (Signed)
CSW acknowledges consult "Access meds for discharge." Notified RNCM.  Joseph Hoffman, [place]

## 2019-06-27 NOTE — Progress Notes (Addendum)
Physical Therapy Evaluation Patient Details Name: Joseph Hoffman MRN: [id number] DOB: March 21, 1951 Today's Date: 06/27/2019   History of Present Illness  Patient is a 68 year old male who presented to the hospital with cellulitus of left lower leg and chest pain. It was found that his aortic vavle was dysfunctional. He has an infection of his left great toe. PMH: Sleep apnea, A-fib, DMII, aortic vavle replacement, CHF, anxiety, aeortic stenois   Clinical Impression  Patient presents with deconditioning and reduced balance with gait. The patient will have a heart cath on the 30th once his foot infection is under control. He has an infection of his left great toe. He puts decreased weight on the left foot with ambulation. He would benefit from light skilled therapy to miantain his endurance but will need a follow up evaluation following his heart surgeries. The patients HR increased from 55 to 88 with ambulation.     Follow Up Recommendations No PT follow up V home health at this time but rehab plan will need to be updated after cath and valve replacement.     Equipment Recommendations  None recommended by PT    Recommendations for Other Services       Precautions / Restrictions Precautions Precaution Comments: monitor HR and breathing  Restrictions Weight Bearing Restrictions: No      Mobility  Bed Mobility Overal bed mobility: Independent             General bed mobility comments: Patient found in his room washing his hands. He reports he got out of bed without difficulty   Transfers Overall transfer level: Independent               General transfer comment: transfered sit to stand on his own. Aftyer standing and talking for a few minutes he became short of breath and sat down again. His baseline HR was 55 bpm and sao2 was 94%  Ambulation/Gait Ambulation/Gait assistance: Min guard Gait Distance (Feet): 50 Feet Assistive device: None   Gait velocity: decreased    General Gait Details: Decreased weight bearing on the right foot. Lateral deviations with gait. Sao2 down to 90 HR to 88   Stairs            Wheelchair Mobility    Modified Rankin (Stroke Patients Only)       Balance                                             Pertinent Vitals/Pain      Home Living Family/patient expects to be discharged to:: Private residence Living Arrangements: Spouse/significant other Available Help at Discharge: Family   Home Access: Level entry              Prior Function Level of Independence: Independent         Comments: used a cane at times buyt otherwise was active and independent      Hand Dominance        Extremity/Trunk Assessment   Upper Extremity Assessment Upper Extremity Assessment: Overall WFL for tasks assessed    Lower Extremity Assessment Lower Extremity Assessment: Overall WFL for tasks assessed    Cervical / Trunk Assessment Cervical / Trunk Assessment: Normal  Communication   Communication: No difficulties  Cognition Arousal/Alertness: Awake/alert Behavior During Therapy: WFL for tasks assessed/performed Overall Cognitive Status: Within Functional Limits for tasks  assessed                                        General Comments      Exercises     Assessment/Plan    PT Assessment Patient needs continued PT services  PT Problem List Decreased strength;Decreased activity tolerance;Decreased balance;Decreased mobility;Decreased knowledge of use of DME;Decreased safety awareness       PT Treatment Interventions DME instruction;Gait training;Therapeutic activities;Functional mobility training;Therapeutic exercise;Neuromuscular re-education;Patient/family education    PT Goals (Current goals can be found in the Care Plan section)  Acute Rehab PT Goals Patient Stated Goal: to have his surgery  PT Goal Formulation: With patient    Frequency Min 2X/week    Barriers to discharge        Co-evaluation               AM-PAC PT "6 Clicks" Mobility  Outcome Measure Help needed turning from your back to your side while in a flat bed without using bedrails?: None Help needed moving from lying on your back to sitting on the side of a flat bed without using bedrails?: None Help needed moving to and from a bed to a chair (including a wheelchair)?: None Help needed standing up from a chair using your arms (e.g., wheelchair or bedside chair)?: None Help needed to walk in hospital room?: A Little Help needed climbing 3-5 steps with a railing? : A Little 6 Click Score: 22    End of Session Equipment Utilized During Treatment: Gait belt Activity Tolerance: Patient limited by fatigue Patient left: in bed;with call bell/phone within reach Nurse Communication: Mobility status PT Visit Diagnosis: Unsteadiness on feet (R26.81);Other abnormalities of gait and mobility (R26.89);Difficulty in walking, not elsewhere classified (R26.2)    Time: 0340-3524 PT Time Calculation (min) (ACUTE ONLY): 17 min   Charges:   PT Evaluation $PT Eval Moderate Complexity: 1 Mod            Carney Living PT DPT  06/27/2019, 12:29 PM

## 2019-06-27 NOTE — Progress Notes (Signed)
RT added water to water chamber per pt request. Pt stated he could place on self when ready for bed.

## 2019-06-27 NOTE — Progress Notes (Signed)
Progress Note  Patient Name: Joseph Hoffman Date of Encounter: 06/27/2019  Primary Cardiologist: Dr. Shelva Majestic  Subjective   Feels better today.  Chest pain has improved.  Not short of breath at rest.  Discomfort in the left leg as before.  Inpatient Medications    Scheduled Meds: . cholecalciferol  1,000 Units Oral Daily  . digoxin  0.125 mg Oral QHS  . escitalopram  10 mg Oral QPM  . ezetimibe  10 mg Oral Daily  . fenofibrate  160 mg Oral Daily  . hydrALAZINE  12.5 mg Oral BID  . Icosapent Ethyl  2 g Oral BID  . insulin aspart  0-15 Units Subcutaneous TID WC  . insulin aspart  0-5 Units Subcutaneous QHS  . insulin glargine  25 Units Subcutaneous BID  . isosorbide mononitrate  30 mg Oral Daily  . metoprolol succinate  37.5 mg Oral BID  . mometasone-formoterol  2 puff Inhalation BID  . saccharomyces boulardii  250 mg Oral Q M,W,F  . senna-docusate  2 tablet Oral BID  . vitamin C  1,000 mg Oral QPM   Continuous Infusions: . cefTRIAXone (ROCEPHIN)  IV Stopped (06/26/19 1653)  . metronidazole 500 mg (06/27/19 0904)  . vancomycin     PRN Meds: acetaminophen **OR** acetaminophen, albuterol, HYDROmorphone (DILAUDID) injection, nitroGLYCERIN, oxyCODONE   Vital Signs    Vitals:   06/27/19 0126 06/27/19 0454 06/27/19 0758 06/27/19 0901  BP: (!) 147/60 (!) 158/68  133/78  Pulse: 79 68  80  Resp: 16 18    Temp: 98 F (36.7 C) 98.4 F (36.9 C)    TempSrc:  Oral    SpO2: 100% 92% 93%   Weight:  112.1 kg    Height:        Intake/Output Summary (Last 24 hours) at 06/27/2019 0925 Last data filed at 06/27/2019 0400 Gross per 24 hour  Intake 0 ml  Output 2500 ml  Net -2500 ml   Filed Weights   06/26/19 1318 06/26/19 1747 06/27/19 0454  Weight: 113.2 kg 113 kg 112.1 kg    Telemetry    Atrial fibrillation.  Personally reviewed.  ECG    Tracing from 06/26/2019 shows atrial fibrillation with IVCD, left anterior fascicular block, occasional PVCs.  Personally  reviewed.  Physical Exam   GEN:  Obese male no acute distress.   Neck: No JVD. Cardiac:  Irregularly irregular, soft systolic and diastolic murmurs, no gallop.  Respiratory: Nonlabored. Clear to auscultation bilaterally. GI: Soft, nontender, bowel sounds present. MS:  Left lower leg erythema and edema; No deformity. Neuro:  Nonfocal. Psych: Alert and oriented x 3. Normal affect.  Labs    Chemistry Recent Labs  Lab 06/26/19 1315 06/27/19 0415  NA 134* 135  K 4.5 3.9  CL 98 96*  CO2 21* 25  GLUCOSE 227* 170*  BUN 46* 45*  CREATININE 2.12* 2.19*  CALCIUM 9.7 9.2  GFRNONAA 31* 29*  GFRAA 35* 34*  ANIONGAP 15 14     Hematology Recent Labs  Lab 06/26/19 1315 06/27/19 0415  WBC 14.9* 12.4*  RBC 5.02 5.14  HGB 13.0 13.2  HCT 41.1 43.1  MCV 81.9 83.9  MCH 25.9* 25.7*  MCHC 31.6 30.6  RDW 17.2* 17.2*  PLT 240 266    BNP Recent Labs  Lab 06/26/19 1315  BNP 669.2*     Radiology    Dg Chest Portable 1 View  Result Date: 06/26/2019 CLINICAL DATA:  Shortness of breath for 3 days. EXAM:  PORTABLE CHEST 1 VIEW COMPARISON:  05/27/2019 and older exams. FINDINGS: Stable changes from prior cardiac surgery. Cardiac silhouette is mildly enlarged. No mediastinal or hilar masses. Lungs show prominent bronchovascular markings similar to the prior study. No evidence of pneumonia. No convincing pleural effusion and no pneumothorax. Skeletal structures are grossly intact. IMPRESSION: 1. Cardiomegaly with prominent bronchovascular markings but no overt pulmonary edema. No evidence of pneumonia. Electronically Signed   By: Lajean Manes M.D.   On: 06/26/2019 13:42   Vas [place] Burnard Bunting With/wo Tbi  Result Date: 06/27/2019 LOWER EXTREMITY DOPPLER STUDY Indications: Ulceration. High Risk Factors: Hypertension, hyperlipidemia, Diabetes. Other Factors: Permanemt A-Fib, CKD Stage 3, Chronic combined CHF.  Comparison Study: No previous exam available for comparison Performing Technologist:  Birdena Crandall, [place] [facility]  Examination Guidelines: A complete evaluation includes at minimum, Doppler waveform signals and systolic blood pressure reading at the level of bilateral brachial, anterior tibial, and posterior tibial arteries, when vessel segments are accessible. Bilateral testing is considered an integral part of a complete examination. Photoelectric Plethysmograph (PPG) waveforms and toe systolic pressure readings are included as required and additional duplex testing as needed. Limited examinations for reoccurring indications may be performed as noted.  ABI Findings: +---------+------------------+-----+----------+--------+ Right    Rt Pressure (mmHg)IndexWaveform  Comment  +---------+------------------+-----+----------+--------+ Brachial 158                    triphasic          +---------+------------------+-----+----------+--------+ PTA      255               1.61 monophasic         +---------+------------------+-----+----------+--------+ DP       190               1.20 monophasic         +---------+------------------+-----+----------+--------+ Great Toe59                0.37                    +---------+------------------+-----+----------+--------+ +---------+------------------+-----+----------+-------------------------+ Left     Lt Pressure (mmHg)IndexWaveform  Comment                   +---------+------------------+-----+----------+-------------------------+ Brachial 148                    triphasic                           +---------+------------------+-----+----------+-------------------------+ PTA      115               0.73 monophasic                          +---------+------------------+-----+----------+-------------------------+ DP       134               0.85 monophasicprobably falsely elevated +---------+------------------+-----+----------+-------------------------+ Great Toe116               0.73                                      +---------+------------------+-----+----------+-------------------------+ +-------+-----------+-----------+------------+------------+ ABI/TBIToday's ABIToday's TBIPrevious ABIPrevious TBI +-------+-----------+-----------+------------+------------+ Right  n/c        0.37                                +-------+-----------+-----------+------------+------------+  Left   0.85       0.73                                +-------+-----------+-----------+------------+------------+ Difficult to evaluate the Doppler waveforms due to cardiac arrythmia and movement of the foot.  Summary: Right: Resting right ankle-brachial index indicates noncompressible right lower extremity arteries.The right toe-brachial index is abnormal. Difficult to evaluate the Doppler waveforms due to invountary movement of the foot and cardiac arrythmia. Left: Resting left ankle-brachial index indicates mild left lower extremity arterial disease. The left toe-brachial index is normal. Difficult to evaluate the Doppler waveforms due to cardiac arrythmia.  *See table(s) above for measurements and observations.  Electronically signed by Deitra Mayo MD on 06/27/2019 at 6:35:01 AM.    Final     Cardiac Studies   TEE 05/29/2019:  1. The left ventricle has moderate-severely reduced systolic function, with an ejection fraction of 30-35%. Left ventricular diffuse hypokinesis.  2. The right ventricle has severely reduced systolic function. The cavity was mildly enlarged.  3. Left atrial size was severely dilated.  4. Right atrial size was severely dilated.  5. The mitral valve is grossly normal. No evidence of mitral valve stenosis.  6. The tricuspid valve was grossly normal.  7. Aortic valve regurgitation is severe by color flow Doppler.  8. There is evidence of mild plaque in the descending aorta.  9. Moderate to severe global reduction in LV systolic function; mild RVE with severe RV dysfunction; severe biatrial  enlargement; s/p mechanical AVR with mean gradient of 14 mmHg (both leaflets appear to be mobile); severe perivalvular AI; mild MR and  TR.  Patient Profile     68 y.o. male with a history of permanent atrial fibrillation, chronic combined CHF, AVR in 2013, HTN, HLD, OSA on CPAP, DM type 2, CKD stage 3, and hepatitis.  He has had recurring biventricular heart failure in association with severe aortic insufficiency documented by recent TEE with plan for right and left heart catheterization.  He presents with chest pain.  Assessment & Plan    1.  Chest pain, somewhat atypical in description and fairly prolonged.  High-sensitivity troponin I 99.  ECG nonspecific.  2.  Acute on chronic biventricular heart failure, LVEF 30 to 35%, and in association with severe aortic regurgitation complicating prior mechanical AVR.  3.  CKD stage III with acute renal insufficiency, creatinine up to 2.1.  4.  Left lower extremity cellulitis, currently on antibiotics per primary team.  5.  Permanent atrial fibrillation.  Heart rate control is adequate on metoprolol and Lanoxin.  6.  Hyperlipidemia with statin intolerance.  He has been on Betasept, Zetia, and fenofibrate.  7.  OSA.  Plan is for right and left heart catheterization early next week depending on degree of renal insufficiency.  Diuretics are currently held.  He is not on ACE inhibitor or ARB.  Follow-up BMET in a.m. and consider very gentle hydration tomorrow.  Coumadin is also on hold with follow-up by pharmacy and plan to start Heparin when INR < 2.5.  Signed, Rozann Lesches, MD  06/27/2019, 9:25 AM

## 2019-06-27 NOTE — Progress Notes (Signed)
ANTICOAGULATION CONSULT NOTE - Initial Consult  Pharmacy Consult for Heparin Indication: atrial fibrillation  Allergies  Allergen Reactions  . Iohexol Anaphylaxis  . Niacin And Related     Flushing with immediate realese  . Penicillins Other (See Comments)    Unknown.Joseph Hoffman a child  Did it involve swelling of the face/tongue/throat, SOB, or low BP? Unknown Did it involve sudden or severe rash/hives, skin peeling, or any reaction on the inside of your mouth or nose? Unknown Did you need to seek medical attention at a hospital or doctor's office? Unknown When did it last happen?Childhood If all above answers are "NO", may proceed with cephalosporin use.    Patient Measurements: Height: 5\' 11"  (180.3 cm) Weight: 247 lb 1.6 oz (112.1 kg) IBW/kg (Calculated) : 75.3  Vital Signs: Temp: 98 F (36.7 C) (07/25 1123) Temp Source: Oral (07/25 1123) BP: 129/55 (07/25 1123) Pulse Rate: 43 (07/25 1123)  Labs: Recent Labs    06/26/19 1315 06/26/19 1446 06/27/19 0415  HGB 13.0  --  13.2  HCT 41.1  --  43.1  PLT 240  --  266  LABPROT 37.3*  --  34.0*  INR 3.9*  --  3.4*  CREATININE 2.12*  --  2.19*  TROPONINIHS 96* 99*  --     Estimated Creatinine Clearance: 40 mL/min (A) (by C-G formula based on SCr of 2.19 mg/dL (H)).   Medical History: Past Medical History:  Diagnosis Date  . Anxiety   . Aortic Hoffman   . Arthritis   . Asthma   . Asymptomatic LV dysfunction, EF 45%, normal coronary arteries on cardiac cath 09/18/12 09/30/2012  . CHF (congestive heart failure) ([place])   . Chronic anticoagulation, on Xarelto prior to admit 09/30/2012  . DM (diabetes mellitus) ([place]) 09/30/2012  . Hepatitis 2003  . Hypertension   . Myocardial infarction ([place])   . Permanent atrial fibrillation, since 1994 09/30/2012   stress test 02/08/12- normal study, no significant ischemia  . S/P AVR (aortic valve replacement), 09/30/2012   a. s/p mechcanical AVR in 09/2012 (on  Coumadin)  . Sleep apnea    uses CPAP    Assessment: 68 yo M presents with CP. He was on warfarin PTA for Afib and mechanical AVR in 09/2012 (last dose 7/23). Pharmacy has been consulted to dose heparin once INR < 2.5. INR (3.4) today remains supratherapeutic, was 3.8 yesterday.  PTA Warfarin: 10 mg daily except 12.5 mg on Tu/Th/Sa  Goal of Therapy:  Heparin level 0.3-0.7 units/ml  Monitor platelets by anticoagulation protocol: Yes   Plan:  Hold heparin until INR < 2.5 Daily PT/INR Warfarin on hold until after procedures  [name] Service, PharmD PGY1 Pharmacy Resident Phone: [phone] 06/27/2019  11:40 AM  Please check [email or web address] for unit-specific pharmacy phone numbers.

## 2019-06-27 NOTE — Progress Notes (Signed)
PROGRESS NOTE  Joseph Hoffman NGE:[id number] DOB: 1951/04/02 DOA: 06/26/2019 PCP: Orpah Melter, MD  HPI/Recap of past 24 hours: Joseph Hoffman  is a 68 y.o. male, with medical history significant ofhistory significant ofhypertension, diabetes mellitus, asthma,sCHFwith EF 45%, CAD, atrial fibrillation on Coumadin,AS, s/p ofAVR with mechanical valveon coumadin, OSA on CPAP, CKD stage III, depression with anxiety, who presents with shortness of breath, and chest pain, as well he reports worsening left great toe ulcer with surrounding cellulitis -Patient reports he started to complain of chest pressure over last 48 hours, accompanied by dyspnea, mainly upon exertion, he did take sublingual nitro, with improvement of his chest pain, but today reported did not have any significant event on his chest pain, patient with known mechanical aortic valve insufficiency, plan to have urgent cath by Dr. Claiborne Billings next week, for anticipated aortic valve replacement by CT surgery next month. -Patient was noted to have left lower extremity diabetic great toe ulcer, report is been going on for last 2 weeks, with some drainage, as well with surrounding cellulitis, he denies any fever, chills, he did not seek any medical care for it. - in ED patient was hypertensive, mildly tachypneic, retinae elevated at 2.1 from baseline 1.8, mild leukocytosis at 14 point 9K, high-sensitivity troponins were elevated at 96>99, INR supratherapeutic at 3.9, COVID-19 is negative, EKG showing A. fib, patient was seen by cardiology for chest pain, and hospitalist were requested to admit for multiple medical problems, with a presentation of infected diabetic ulcer.  06/27/19: Patient was seen and examined at bedside this morning.  Reports significant pain in the left great toe 9 out of 10.  Patient made aware of pain management in place as needed.  Denies chest pain, dyspnea at rest or palpitations.  Assessment/Plan: Active Problems:  Chronic anticoagulation   AS (aortic stenosis)   Essential hypertension   Acute combined systolic and diastolic heart failure (HCC)   CKD (chronic kidney disease), stage III (HCC)   Acute on chronic systolic (congestive) heart failure (HCC)   Depression   Foot ulcer, left (HCC)  Sepsis secondary to L great toe diabetic ulcer with surrounding cellulitis Presented with leukocytosis with WBC 14 K, tachypnea with respiratory rate 31 Started on broad-spectrum IV antibiotics empirically, continue Monitor fever curve and WBC Repeat CBC with differentials in the morning Continue to closely monitor vital signs  AKI on CKD3 Baseline creatinine appears to be 1.8 with GFR of 37 Presented with creatinine of 2.19 with GFR of 29 Avoid nephrotoxins Diuretics are currently held, possible gentle IV fluid hydration in the morning, defer to cardiology. Monitor urine output Repeat BMP in the morning  Acute on chronic combined diastolic and systolic CHF Presented with BNP greater than 600 Cardiology following Net I&O  -3.7 L since admission Continue cardiac medications as recommended by cardiology Right and left heart cath planned next week per cardiology  Acute on chronic hypoxic respiratory failure likely secondary to acute on chronic combined CHF Currently on 3 L with sats 94% Self reports on home O2 supplementation only at night  Severe aortic regurgitation post mechanical AVR On Coumadin, held due to planned heart cath next week  Chest pain, resolved   Permanent atrial fibrillation Currently rate controlled On Coumadin, held due to possible procedure next week Plan to start heparin drip when INR is less than 2.5  OSA Continue CPAP at night  Hyperlipidemia Intolerant to statin    Risks: High risk for decompensation due to acute on chronic combined  CHF requiring heart cath, severe aortic regurgitation, multiple comorbidities and advanced age.  Patient will require at least 2  midnights for further evaluation and treatment of present condition.     Code Status: Full code  Family Communication: We will call family if okay with the patient.  Disposition Plan: Discharge to home when cardiology signs off.   Consultants:  Cardiology  Procedures:  None  Antimicrobials:  IV vancomycin, Rocephin, IV Flagyl  DVT prophylaxis: Therapeutic INR from Coumadin   Objective: Vitals:   06/27/19 0454 06/27/19 0758 06/27/19 0901 06/27/19 1123  BP: (!) 158/68  133/78 (!) 129/55  Pulse: 68  80 (!) 43  Resp: 18   20  Temp: 98.4 F (36.9 C)   98 F (36.7 C)  TempSrc: Oral   Oral  SpO2: 92% 93%  94%  Weight: 112.1 kg     Height:        Intake/Output Summary (Last 24 hours) at 06/27/2019 1432 Last data filed at 06/27/2019 1346 Gross per 24 hour  Intake 240 ml  Output 4000 ml  Net -3760 ml   Filed Weights   06/26/19 1318 06/26/19 1747 06/27/19 0454  Weight: 113.2 kg 113 kg 112.1 kg    Exam:   General: 68 y.o. year-old male well developed well nourished in no acute distress.  Alert and oriented x3.  Cardiovascular: Irregular rate and rhythm with no rubs or gallops.  No thyromegaly or JVD noted.    Respiratory: Clear to auscultation with no wheezes or rales. Good inspiratory effort.  Abdomen: Soft nontender nondistended with normal bowel sounds x4 quadrants.  Musculoskeletal: Trace lower extremity edema. 2/4 pulses in all 4 extremities.  Skin: No ulcerative lesions noted or rashes, left great toe erythematous, wound and edematous with surrounding cellulitis.  Psychiatry: Mood is appropriate for condition and setting   Data Reviewed: CBC: Recent Labs  Lab 06/26/19 1315 06/27/19 0415  WBC 14.9* 12.4*  HGB 13.0 13.2  HCT 41.1 43.1  MCV 81.9 83.9  PLT 240 419   Basic Metabolic Panel: Recent Labs  Lab 06/26/19 1315 06/27/19 0415  NA 134* 135  K 4.5 3.9  CL 98 96*  CO2 21* 25  GLUCOSE 227* 170*  BUN 46* 45*  CREATININE 2.12* 2.19*   CALCIUM 9.7 9.2   GFR: Estimated Creatinine Clearance: 40 mL/min (A) (by C-G formula based on SCr of 2.19 mg/dL (H)). Liver Function Tests: No results for input(s): AST, ALT, ALKPHOS, BILITOT, PROT, ALBUMIN in the last 168 hours. No results for input(s): LIPASE, AMYLASE in the last 168 hours. No results for input(s): AMMONIA in the last 168 hours. Coagulation Profile: Recent Labs  Lab 06/26/19 1315 06/27/19 0415  INR 3.9* 3.4*   Cardiac Enzymes: No results for input(s): CKTOTAL, CKMB, CKMBINDEX, TROPONINI in the last 168 hours. BNP (last 3 results) No results for input(s): PROBNP in the last 8760 hours. HbA1C: No results for input(s): HGBA1C in the last 72 hours. CBG: Recent Labs  Lab 06/26/19 1337 06/26/19 1804 06/26/19 2119 06/27/19 0558 06/27/19 1125  GLUCAP 219* 149* 277* 169* 184*   Lipid Profile: No results for input(s): CHOL, HDL, LDLCALC, TRIG, CHOLHDL, LDLDIRECT in the last 72 hours. Thyroid Function Tests: No results for input(s): TSH, T4TOTAL, FREET4, T3FREE, THYROIDAB in the last 72 hours. Anemia Panel: No results for input(s): VITAMINB12, FOLATE, FERRITIN, TIBC, IRON, RETICCTPCT in the last 72 hours. Urine analysis:    Component Value Date/Time   COLORURINE YELLOW 09/25/2012 [place]  09/25/2012 1506   LABSPEC 1.045 (H) 09/25/2012 1506   PHURINE 6.0 09/25/2012 1506   GLUCOSEU >1000 (A) 09/25/2012 1506   HGBUR NEGATIVE 09/25/2012 1506   BILIRUBINUR NEGATIVE 09/25/2012 1506   KETONESUR NEGATIVE 09/25/2012 1506   PROTEINUR NEGATIVE 09/25/2012 1506   UROBILINOGEN 0.2 09/25/2012 1506   NITRITE NEGATIVE 09/25/2012 1506   LEUKOCYTESUR NEGATIVE 09/25/2012 1506   Sepsis Labs: @LABRCNTIP (procalcitonin:4,lacticidven:4)  ) Recent Results (from the past 240 hour(s))  SARS Coronavirus 2 (CEPHEID - Performed in [place] hospital lab), Hosp Order     Status: None   Collection Time: 06/26/19  1:15 PM   Specimen: Nasopharyngeal Swab  Result  Value Ref Range Status   SARS Coronavirus 2 NEGATIVE NEGATIVE Final    Comment: (NOTE) If result is NEGATIVE SARS-CoV-2 target nucleic acids are NOT DETECTED. The SARS-CoV-2 RNA is generally detectable in upper and lower  respiratory specimens during the acute phase of infection. The lowest  concentration of SARS-CoV-2 viral copies this assay can detect is 250  copies / mL. A negative result does not preclude SARS-CoV-2 infection  and should not be used as the sole basis for treatment or other  patient management decisions.  A negative result may occur with  improper specimen collection / handling, submission of specimen other  than nasopharyngeal swab, presence of viral mutation(s) within the  areas targeted by this assay, and inadequate number of viral copies  (<250 copies / mL). A negative result must be combined with clinical  observations, patient history, and epidemiological information. If result is POSITIVE SARS-CoV-2 target nucleic acids are DETECTED. The SARS-CoV-2 RNA is generally detectable in upper and lower  respiratory specimens dur ing the acute phase of infection.  Positive  results are indicative of active infection with SARS-CoV-2.  Clinical  correlation with patient history and other diagnostic information is  necessary to determine patient infection status.  Positive results do  not rule out bacterial infection or co-infection with other viruses. If result is PRESUMPTIVE POSTIVE SARS-CoV-2 nucleic acids MAY BE PRESENT.   A presumptive positive result was obtained on the submitted specimen  and confirmed on repeat testing.  While 2019 novel coronavirus  (SARS-CoV-2) nucleic acids may be present in the submitted sample  additional confirmatory testing may be necessary for epidemiological  and / or clinical management purposes  to differentiate between  SARS-CoV-2 and other Sarbecovirus currently known to infect humans.  If clinically indicated additional testing  with an alternate test  methodology [phone]) is advised. The SARS-CoV-2 RNA is generally  detectable in upper and lower respiratory sp ecimens during the acute  phase of infection. The expected result is Negative. Fact Sheet for Patients:  StrictlyIdeas.no Fact Sheet for Healthcare Providers: BankingDealers.co.za This test is not yet approved or cleared by the [place] FDA and has been authorized for detection and/or diagnosis of SARS-CoV-2 by FDA under an Emergency Use Authorization (EUA).  This EUA will remain in effect (meaning this test can be used) for the duration of the COVID-19 declaration under Section 564(b)(1) of the Act, 21 U.S.C. section 360bbb-3(b)(1), unless the authorization is terminated or revoked sooner. Performed at [facility], [place] [address], [place], [place] [address]   Culture, blood (routine x 2)     Status: None (Preliminary result)   Collection Time: 06/26/19  2:46 PM   Specimen: BLOOD  Result Value Ref Range Status   Specimen Description BLOOD RIGHT ANTECUBITAL  Final   Special Requests   Final  BOTTLES DRAWN AEROBIC AND ANAEROBIC Blood Culture results may not be optimal due to an excessive volume of blood received in culture bottles   Culture   Final    NO GROWTH < 24 HOURS Performed at [facility] [address], [place], [place] [address]    Report Status PENDING  Incomplete  Culture, blood (routine x 2)     Status: None (Preliminary result)   Collection Time: 06/26/19  2:52 PM   Specimen: BLOOD LEFT FOREARM  Result Value Ref Range Status   Specimen Description BLOOD LEFT FOREARM  Final   Special Requests   Final    BOTTLES DRAWN AEROBIC AND ANAEROBIC Blood Culture adequate volume   Culture   Final    NO GROWTH < 24 HOURS Performed at [facility], [place] [address] [place], [place] [address]    Report Status PENDING  Incomplete      Studies: Vas [place] Abi With/wo  Tbi  Result Date: 06/27/2019 LOWER EXTREMITY DOPPLER STUDY Indications: Ulceration. High Risk Factors: Hypertension, hyperlipidemia, Diabetes. Other Factors: Permanemt A-Fib, CKD Stage 3, Chronic combined CHF.  Comparison Study: No previous exam available for comparison Performing Technologist: Birdena Crandall, [place] [facility]  Examination Guidelines: A complete evaluation includes at minimum, Doppler waveform signals and systolic blood pressure reading at the level of bilateral brachial, anterior tibial, and posterior tibial arteries, when vessel segments are accessible. Bilateral testing is considered an integral part of a complete examination. Photoelectric Plethysmograph (PPG) waveforms and toe systolic pressure readings are included as required and additional duplex testing as needed. Limited examinations for reoccurring indications may be performed as noted.  ABI Findings: +---------+------------------+-----+----------+--------+  Right     Rt Pressure (mmHg) Index Waveform   Comment   +---------+------------------+-----+----------+--------+  Brachial  158                      triphasic            +---------+------------------+-----+----------+--------+  PTA       255                1.61  monophasic           +---------+------------------+-----+----------+--------+  DP        190                1.20  monophasic           +---------+------------------+-----+----------+--------+  Great Toe 59                 0.37                       +---------+------------------+-----+----------+--------+ +---------+------------------+-----+----------+-------------------------+  Left      Lt Pressure (mmHg) Index Waveform   Comment                    +---------+------------------+-----+----------+-------------------------+  Brachial  148                      triphasic                             +---------+------------------+-----+----------+-------------------------+  PTA       115                0.73  monophasic                             +---------+------------------+-----+----------+-------------------------+  DP        134                0.85  monophasic probably falsely elevated  +---------+------------------+-----+----------+-------------------------+  Great Toe 116                0.73                                        +---------+------------------+-----+----------+-------------------------+ +-------+-----------+-----------+------------+------------+  ABI/TBI Today's ABI Today's TBI Previous ABI Previous TBI  +-------+-----------+-----------+------------+------------+  Right   n/c         0.37                                   +-------+-----------+-----------+------------+------------+  Left    0.85        0.73                                   +-------+-----------+-----------+------------+------------+ Difficult to evaluate the Doppler waveforms due to cardiac arrythmia and movement of the foot.  Summary: Right: Resting right ankle-brachial index indicates noncompressible right lower extremity arteries.The right toe-brachial index is abnormal. Difficult to evaluate the Doppler waveforms due to invountary movement of the foot and cardiac arrythmia. Left: Resting left ankle-brachial index indicates mild left lower extremity arterial disease. The left toe-brachial index is normal. Difficult to evaluate the Doppler waveforms due to cardiac arrythmia.  *See table(s) above for measurements and observations.  Electronically signed by Deitra Mayo MD on 06/27/2019 at 6:35:01 AM.    Final     Scheduled Meds:  cholecalciferol  1,000 Units Oral Daily   digoxin  0.125 mg Oral QHS   escitalopram  10 mg Oral QPM   ezetimibe  10 mg Oral Daily   feeding supplement  1 Container Oral TID BM   fenofibrate  160 mg Oral Daily   hydrALAZINE  12.5 mg Oral BID   Icosapent Ethyl  2 g Oral BID   insulin aspart  0-15 Units Subcutaneous TID WC   insulin aspart  0-5 Units Subcutaneous QHS   insulin glargine  25 Units Subcutaneous  BID   isosorbide mononitrate  30 mg Oral Daily   metoprolol succinate  37.5 mg Oral BID   mometasone-formoterol  2 puff Inhalation BID   multivitamin  1 tablet Oral Daily   nutrition supplement (JUVEN)  1 packet Oral BID BM   saccharomyces boulardii  250 mg Oral Q M,W,F   senna-docusate  2 tablet Oral BID   vitamin C  1,000 mg Oral QPM    Continuous Infusions:  cefTRIAXone (ROCEPHIN)  IV 2 g (06/27/19 1345)   metronidazole 500 mg (06/27/19 0904)   vancomycin       LOS: 1 day     Kayleen Memos, MD [facility] Pager [phone]  If 7PM-7AM, please contact night-coverage [email or web address] Password [facility] 06/27/2019, 2:32 PM

## 2019-06-28 DIAGNOSIS — I351 Nonrheumatic aortic (valve) insufficiency: Secondary | ICD-10-CM

## 2019-06-28 LAB — CBC WITH DIFFERENTIAL/PLATELET
Abs Immature Granulocytes: 0.09 10*3/uL — ABNORMAL HIGH (ref 0.00–0.07)
Basophils Absolute: 0.1 10*3/uL (ref 0.0–0.1)
Basophils Relative: 1 %
Eosinophils Absolute: 0.4 10*3/uL (ref 0.0–0.5)
Eosinophils Relative: 4 %
HCT: 40.6 % (ref 39.0–52.0)
Hemoglobin: 12.5 g/dL — ABNORMAL LOW (ref 13.0–17.0)
Immature Granulocytes: 1 %
Lymphocytes Relative: 12 %
Lymphs Abs: 1.3 10*3/uL (ref 0.7–4.0)
MCH: 25.8 pg — ABNORMAL LOW (ref 26.0–34.0)
MCHC: 30.8 g/dL (ref 30.0–36.0)
MCV: 83.7 fL (ref 80.0–100.0)
Monocytes Absolute: 1.4 10*3/uL — ABNORMAL HIGH (ref 0.1–1.0)
Monocytes Relative: 13 %
Neutro Abs: 7.8 10*3/uL — ABNORMAL HIGH (ref 1.7–7.7)
Neutrophils Relative %: 69 %
Platelets: 237 10*3/uL (ref 150–400)
RBC: 4.85 MIL/uL (ref 4.22–5.81)
RDW: 17.3 % — ABNORMAL HIGH (ref 11.5–15.5)
WBC: 11.1 10*3/uL — ABNORMAL HIGH (ref 4.0–10.5)
nRBC: 0 % (ref 0.0–0.2)

## 2019-06-28 LAB — GLUCOSE, CAPILLARY
Glucose-Capillary: 139 mg/dL — ABNORMAL HIGH (ref 70–99)
Glucose-Capillary: 341 mg/dL — ABNORMAL HIGH (ref 70–99)
Glucose-Capillary: 481 mg/dL — ABNORMAL HIGH (ref 70–99)
Glucose-Capillary: 290 mg/dL — ABNORMAL HIGH (ref 70–99)

## 2019-06-28 LAB — PROTIME-INR
INR: 2.5 — ABNORMAL HIGH (ref 0.8–1.2)
INR: 2.4 — ABNORMAL HIGH (ref 0.8–1.2)
Prothrombin Time: 26.2 seconds — ABNORMAL HIGH (ref 11.4–15.2)
Prothrombin Time: 26 seconds — ABNORMAL HIGH (ref 11.4–15.2)

## 2019-06-28 LAB — BASIC METABOLIC PANEL
Anion gap: 13 (ref 5–15)
BUN: 45 mg/dL — ABNORMAL HIGH (ref 8–23)
CO2: 24 mmol/L (ref 22–32)
Calcium: 9 mg/dL (ref 8.9–10.3)
Chloride: 97 mmol/L — ABNORMAL LOW (ref 98–111)
Creatinine, Ser: 1.92 mg/dL — ABNORMAL HIGH (ref 0.61–1.24)
GFR calc Af Amer: 40 mL/min — ABNORMAL LOW (ref >60)
GFR calc non Af Amer: 35 mL/min — ABNORMAL LOW (ref >60)
Glucose, Bld: 140 mg/dL — ABNORMAL HIGH (ref 70–99)
Potassium: 4.2 mmol/L (ref 3.5–5.1)
Sodium: 134 mmol/L — ABNORMAL LOW (ref 135–145)

## 2019-06-28 MED ORDER — HEPARIN (PORCINE) 25000 UT/250ML-% IV SOLN
1700.0000 [IU]/h | INTRAVENOUS | Status: DC
  Administered 2019-06-28: 1400 [IU]/h via INTRAVENOUS
  Filled 2019-06-28: qty 250

## 2019-06-28 MED ORDER — INSULIN ASPART 100 UNIT/ML ~~LOC~~ SOLN
5.0000 [IU] | Freq: Three times a day (TID) | SUBCUTANEOUS | Status: DC
  Administered 2019-06-28: 5 [IU] via SUBCUTANEOUS

## 2019-06-28 MED ORDER — INSULIN GLARGINE 100 UNIT/ML ~~LOC~~ SOLN: 5.0000 [IU] | Freq: Once | SUBCUTANEOUS | Status: DC

## 2019-06-28 MED ORDER — INSULIN ASPART 100 UNIT/ML ~~LOC~~ SOLN
10.0000 [IU] | Freq: Three times a day (TID) | SUBCUTANEOUS | Status: DC
  Administered 2019-06-28 – 2019-07-02 (×9): 10 [IU] via SUBCUTANEOUS

## 2019-06-28 MED ORDER — INSULIN GLARGINE 100 UNIT/ML ~~LOC~~ SOLN
30.0000 [IU] | Freq: Two times a day (BID) | SUBCUTANEOUS | Status: DC
  Administered 2019-06-29: 30 [IU] via SUBCUTANEOUS
  Filled 2019-06-28 (×4): qty 0.3

## 2019-06-28 NOTE — Progress Notes (Signed)
Pt can place self on cpap unit when ready for bed. RT added water to water chamber. RT will continue to monitor as needed.

## 2019-06-28 NOTE — H&P (View-Only) (Signed)
Progress Note  Patient Name: Joseph Hoffman Date of Encounter: 06/28/2019  Primary Cardiologist: Dr. Shelva Majestic  Subjective   Had recurrent chest discomfort last evening, otherwise feels better.  Not short of breath at rest.  Left leg discomfort somewhat less noticeable.  Inpatient Medications    Scheduled Meds: . cholecalciferol  1,000 Units Oral Daily  . digoxin  0.125 mg Oral QHS  . escitalopram  10 mg Oral QPM  . ezetimibe  10 mg Oral Daily  . feeding supplement  1 Container Oral TID BM  . fenofibrate  160 mg Oral Daily  . hydrALAZINE  12.5 mg Oral BID  . Icosapent Ethyl  2 g Oral BID  . insulin aspart  0-15 Units Subcutaneous TID WC  . insulin aspart  0-5 Units Subcutaneous QHS  . insulin glargine  25 Units Subcutaneous BID  . isosorbide mononitrate  30 mg Oral Daily  . metoprolol succinate  37.5 mg Oral BID  . mometasone-formoterol  2 puff Inhalation BID  . multivitamin  1 tablet Oral Daily  . nutrition supplement (JUVEN)  1 packet Oral BID BM  . saccharomyces boulardii  250 mg Oral Q M,W,F  . senna-docusate  2 tablet Oral BID  . vitamin C  1,000 mg Oral QPM   Continuous Infusions: . cefTRIAXone (ROCEPHIN)  IV 2 g (06/27/19 1345)  . metronidazole 500 mg (06/28/19 0832)  . vancomycin 1,000 mg (06/27/19 1648)   PRN Meds: acetaminophen **OR** acetaminophen, albuterol, HYDROmorphone (DILAUDID) injection, nitroGLYCERIN, oxyCODONE   Vital Signs    Vitals:   06/27/19 1913 06/28/19 0609 06/28/19 0749 06/28/19 0800  BP:  119/63  128/66  Pulse:  72  86  Resp:  20    Temp:      TempSrc:      SpO2: 93% 97% 97%   Weight:  112.8 kg    Height:        Intake/Output Summary (Last 24 hours) at 06/28/2019 0923 Last data filed at 06/28/2019 0500 Gross per 24 hour  Intake 551.52 ml  Output 2200 ml  Net -1648.48 ml   Filed Weights   06/26/19 1747 06/27/19 0454 06/28/19 0609  Weight: 113 kg 112.1 kg 112.8 kg    Telemetry    Atrial fibrillation.  Personally  reviewed.  ECG    Tracing from 06/26/2019 shows atrial fibrillation with IVCD, left anterior fascicular block, occasional PVCs.  Personally reviewed.  Physical Exam   GEN:  Obese male, no acute distress.   Neck: No JVD. Cardiac:  Irregularly irregular, soft systolic and diastolic murmurs, no gallop.  Respiratory: Nonlabored. Clear to auscultation bilaterally. GI: Soft, nontender, bowel sounds present. MS:  Left lower leg erythema; No deformity. Neuro:  Nonfocal. Psych: Alert and oriented x 3. Normal affect.  Labs    Chemistry Recent Labs  Lab 06/26/19 1315 06/27/19 0415 06/28/19 0602  NA 134* 135 134*  K 4.5 3.9 4.2  CL 98 96* 97*  CO2 21* 25 24  GLUCOSE 227* 170* 140*  BUN 46* 45* 45*  CREATININE 2.12* 2.19* 1.92*  CALCIUM 9.7 9.2 9.0  GFRNONAA 31* 29* 35*  GFRAA 35* 34* 40*  ANIONGAP 15 14 13      Hematology Recent Labs  Lab 06/26/19 1315 06/27/19 0415 06/28/19 0602  WBC 14.9* 12.4* 11.1*  RBC 5.02 5.14 4.85  HGB 13.0 13.2 12.5*  HCT 41.1 43.1 40.6  MCV 81.9 83.9 83.7  MCH 25.9* 25.7* 25.8*  MCHC 31.6 30.6 30.8  RDW 17.2* 17.2*  17.3*  PLT 240 266 237    BNP Recent Labs  Lab 06/26/19 1315  BNP 669.2*     Radiology    Dg Chest Portable 1 View  Result Date: 06/26/2019 CLINICAL DATA:  Shortness of breath for 3 days. EXAM: PORTABLE CHEST 1 VIEW COMPARISON:  05/27/2019 and older exams. FINDINGS: Stable changes from prior cardiac surgery. Cardiac silhouette is mildly enlarged. No mediastinal or hilar masses. Lungs show prominent bronchovascular markings similar to the prior study. No evidence of pneumonia. No convincing pleural effusion and no pneumothorax. Skeletal structures are grossly intact. IMPRESSION: 1. Cardiomegaly with prominent bronchovascular markings but no overt pulmonary edema. No evidence of pneumonia. Electronically Signed   By: Lajean Manes M.D.   On: 06/26/2019 13:42   Vas [place] Burnard Bunting With/wo Tbi  Result Date: 06/27/2019 LOWER EXTREMITY  DOPPLER STUDY Indications: Ulceration. High Risk Factors: Hypertension, hyperlipidemia, Diabetes. Other Factors: Permanemt A-Fib, CKD Stage 3, Chronic combined CHF.  Comparison Study: No previous exam available for comparison Performing Technologist: Birdena Crandall, [place] [facility]  Examination Guidelines: A complete evaluation includes at minimum, Doppler waveform signals and systolic blood pressure reading at the level of bilateral brachial, anterior tibial, and posterior tibial arteries, when vessel segments are accessible. Bilateral testing is considered an integral part of a complete examination. Photoelectric Plethysmograph (PPG) waveforms and toe systolic pressure readings are included as required and additional duplex testing as needed. Limited examinations for reoccurring indications may be performed as noted.  ABI Findings: +---------+------------------+-----+----------+--------+ Right    Rt Pressure (mmHg)IndexWaveform  Comment  +---------+------------------+-----+----------+--------+ Brachial 158                    triphasic          +---------+------------------+-----+----------+--------+ PTA      255               1.61 monophasic         +---------+------------------+-----+----------+--------+ DP       190               1.20 monophasic         +---------+------------------+-----+----------+--------+ Great Toe59                0.37                    +---------+------------------+-----+----------+--------+ +---------+------------------+-----+----------+-------------------------+ Left     Lt Pressure (mmHg)IndexWaveform  Comment                   +---------+------------------+-----+----------+-------------------------+ Brachial 148                    triphasic                           +---------+------------------+-----+----------+-------------------------+ PTA      115               0.73 monophasic                           +---------+------------------+-----+----------+-------------------------+ DP       134               0.85 monophasicprobably falsely elevated +---------+------------------+-----+----------+-------------------------+ Great Toe116               0.73                                     +---------+------------------+-----+----------+-------------------------+ +-------+-----------+-----------+------------+------------+  ABI/TBIToday's ABIToday's TBIPrevious ABIPrevious TBI +-------+-----------+-----------+------------+------------+ Right  n/c        0.37                                +-------+-----------+-----------+------------+------------+ Left   0.85       0.73                                +-------+-----------+-----------+------------+------------+ Difficult to evaluate the Doppler waveforms due to cardiac arrythmia and movement of the foot.  Summary: Right: Resting right ankle-brachial index indicates noncompressible right lower extremity arteries.The right toe-brachial index is abnormal. Difficult to evaluate the Doppler waveforms due to invountary movement of the foot and cardiac arrythmia. Left: Resting left ankle-brachial index indicates mild left lower extremity arterial disease. The left toe-brachial index is normal. Difficult to evaluate the Doppler waveforms due to cardiac arrythmia.  *See table(s) above for measurements and observations.  Electronically signed by Deitra Mayo MD on 06/27/2019 at 6:35:01 AM.    Final     Cardiac Studies   TEE 05/29/2019: 1. The left ventricle has moderate-severely reduced systolic function, with an ejection fraction of 30-35%. Left ventricular diffuse hypokinesis. 2. The right ventricle has severely reduced systolic function. The cavity was mildly enlarged. 3. Left atrial size was severely dilated. 4. Right atrial size was severely dilated. 5. The mitral valve is grossly normal. No evidence of mitral valve stenosis. 6.  The tricuspid valve was grossly normal. 7. Aortic valve regurgitation is severe by color flow Doppler. 8. There is evidence of mild plaque in the descending aorta. 9. Moderate to severe global reduction in LV systolic function; mild RVE with severe RV dysfunction; severe biatrial enlargement; s/p mechanical AVR with mean gradient of 14 mmHg (both leaflets appear to be mobile); severe perivalvular AI; mild MR and  TR.  Patient Profile     68 y.o. male with a history of permanent atrial fibrillation, chronic combined CHF, AVR in 2013, HTN, HLD, OSA on CPAP, DM type 2, CKD stage 3, and hepatitis.  He has had recurring biventricular heart failure in association with severe aortic insufficiency documented by recent TEE with plan for right and left heart catheterization.  He presents with chest pain.  Assessment & Plan    1.  Chest pain, recurrent but with somewhat atypical features.  High-sensitivity troponin I 99.  ECG is nonspecific.  2.  Acute on chronic biventricular heart failure.  LVEF is 30 to 35% and associated with severe aortic regurgitation complicating previous mechanical AVR.  Coumadin is held at this time.  INR down from 3.4-2.5.  Plan to start heparin when INR is under 2.5.  3.  CKD stage III with acute renal insufficiency, creatinine has improved down to 1.9.  Diuretics currently held.  4.  Permanent atrial fibrillation.  Heart rate is adequately controlled on metoprolol and Lanoxin.  4.  Left lower extremity cellulitis, on antibiotics per primary team.  Patient is afebrile.  5.  Mixed hyperlipidemia with statin intolerance.  He has been on of a Vascepa, Zetia, and fenofibrate.  6.  OSA.  Tentatively plan on right and left heart catheterization for tomorrow presuming renal function improves and INR continues to drift down.  Heparin will be started when INR is less than 2.5.  Continue to hold diuretics, otherwise no changes to current regimen.  Signed, Rozann Lesches, MD  06/28/2019, 9:23 AM

## 2019-06-28 NOTE — Progress Notes (Signed)
PROGRESS NOTE  Joseph Hoffman LDJ:[id number] DOB: 11-17-1951 DOA: 06/26/2019 PCP: Orpah Melter, MD  HPI/Recap of past 24 hours: Joseph Hoffman  is a 68 y.o. male, with medical history significant ofhistory significant ofhypertension, diabetes mellitus, asthma,sCHFwith EF 45%, CAD, atrial fibrillation on Coumadin,AS, s/p ofAVR with mechanical valveon coumadin, OSA on CPAP, CKD stage III, depression with anxiety, who presents with shortness of breath, and chest pain, as well he reports worsening left great toe ulcer with surrounding cellulitis -Patient reports he started to complain of chest pressure over last 48 hours, accompanied by dyspnea, mainly upon exertion, he did take sublingual nitro, with improvement of his chest pain, but today reported did not have any significant event on his chest pain, patient with known mechanical aortic valve insufficiency, plan to have urgent cath by Dr. Claiborne Billings next week, for anticipated aortic valve replacement by CT surgery next month. -Patient was noted to have left lower extremity diabetic great toe ulcer, report is been going on for last 2 weeks, with some drainage, as well with surrounding cellulitis, he denies any fever, chills, he did not seek any medical care for it. - in ED patient was hypertensive, mildly tachypneic, retinae elevated at 2.1 from baseline 1.8, mild leukocytosis at 14 point 9K, high-sensitivity troponins were elevated at 96>99, INR supratherapeutic at 3.9, COVID-19 is negative, EKG showing A. fib, patient was seen by cardiology for chest pain, and hospitalist were requested to admit for multiple medical problems, with a presentation of infected diabetic ulcer.  06/28/19: Patient was seen and examined at his bedside this morning.  No acute events overnight.  He wore his CPAP machine.  Denies chest pain, dyspnea or palpitations at this time.  Plan for right and left heart cath tomorrow.    Assessment/Plan: Active Problems:   Chronic  anticoagulation   AS (aortic stenosis)   Essential hypertension   Acute combined systolic and diastolic heart failure (HCC)   CKD (chronic kidney disease), stage III (HCC)   Acute on chronic systolic (congestive) heart failure (HCC)   Depression   Foot ulcer, left (HCC)  Sepsis secondary to L great toe diabetic ulcer with surrounding cellulitis Presented with leukocytosis with WBC 14 K, tachypnea with respiratory rate 31 Sepsis physiology is resolving WBC 11.1K from 12.4K from 14.9K C/w broad-spectrum IV antibiotics empirically, continue Continue to Monitor fever curve and WBC  Resolving AKI on CKD3 Baseline creatinine appears to be 1.8 with GFR of 13 Presented with creatinine of 2.19 with GFR of 29 Cr 1.92 from 2.19 yesterday Off diuretics due to AKI Avoid nephrotoxins Net I&O -4.8L since admission  Acute on chronic combined diastolic and systolic CHF Presented with BNP greater than 600 Cardiology following Continue cardiac medications as recommended by cardiology Right and left heart cath planned tomorrow 06/29/19  Acute on chronic hypoxic respiratory failure likely secondary to acute on chronic combined CHF Currently on 3 L with sats 94% Self reports on home O2 supplementation only at night  Severe aortic regurgitation post mechanical AVR On Coumadin, held due to planned heart cath next week  Chest pain, resolved   Permanent atrial fibrillation Currently rate controlled On Coumadin, held due to possible procedure next week  Heparin drip when INR is less than 2.5  OSA Continue CPAP at night  Hyperlipidemia Intolerant to statin On fenofibrate, icosapent, and zetia     Code Status: Full code  Family Communication: We will call family if okay with the patient.  Disposition Plan: Discharge to home possibly  in 1-2 days or when cardiology signs off.  Consultants:  Cardiology  Procedures:  None  Antimicrobials:  IV vancomycin, Rocephin, IV Flagyl  DVT  prophylaxis: Therapeutic INR from Coumadin   Objective: Vitals:   06/28/19 0609 06/28/19 0749 06/28/19 0800 06/28/19 1129  BP: 119/63  128/66 139/69  Pulse: 72  86 (!) 102  Resp: 20   20  Temp:    98.2 F (36.8 C)  TempSrc:    Oral  SpO2: 97% 97%  92%  Weight: 112.8 kg     Height:        Intake/Output Summary (Last 24 hours) at 06/28/2019 1157 Last data filed at 06/28/2019 1129 Gross per 24 hour  Intake 791.52 ml  Output 2400 ml  Net -1608.48 ml   Filed Weights   06/26/19 1747 06/27/19 0454 06/28/19 0609  Weight: 113 kg 112.1 kg 112.8 kg    Exam:  . General: 68 y.o. year-old male well-developed well-nourished in no acute distress.  Alert and oriented x4.   . Cardiovascular: Irregular rate and rhythm no rubs or gallops no JVD or thyromegaly. [name] Respiratory: Clear to auscultation no wheezes or rales.  Good respiratory effort.   . Abdomen: Obese soft nontender nondistended normal bowel sounds present.   . Musculoskeletal: Bilateral trace lower extremity edema.  2 out of 4 pulses in all 4 extremities.   . Skin: Left great toe erythema and warmth improving.   Psychiatry: Mood is appropriate for condition and setting.  Data Reviewed: CBC: Recent Labs  Lab 06/26/19 1315 06/27/19 0415 06/28/19 0602  WBC 14.9* 12.4* 11.1*  NEUTROABS  --   --  7.8*  HGB 13.0 13.2 12.5*  HCT 41.1 43.1 40.6  MCV 81.9 83.9 83.7  PLT 240 266 373   Basic Metabolic Panel: Recent Labs  Lab 06/26/19 1315 06/27/19 0415 06/28/19 0602  NA 134* 135 134*  K 4.5 3.9 4.2  CL 98 96* 97*  CO2 21* 25 24  GLUCOSE 227* 170* 140*  BUN 46* 45* 45*  CREATININE 2.12* 2.19* 1.92*  CALCIUM 9.7 9.2 9.0   GFR: Estimated Creatinine Clearance: 45.7 mL/min (A) (by C-G formula based on SCr of 1.92 mg/dL (H)). Liver Function Tests: No results for input(s): AST, ALT, ALKPHOS, BILITOT, PROT, ALBUMIN in the last 168 hours. No results for input(s): LIPASE, AMYLASE in the last 168 hours. No results for  input(s): AMMONIA in the last 168 hours. Coagulation Profile: Recent Labs  Lab 06/26/19 1315 06/27/19 0415 06/28/19 0602  INR 3.9* 3.4* 2.5*   Cardiac Enzymes: No results for input(s): CKTOTAL, CKMB, CKMBINDEX, TROPONINI in the last 168 hours. BNP (last 3 results) No results for input(s): PROBNP in the last 8760 hours. HbA1C: No results for input(s): HGBA1C in the last 72 hours. CBG: Recent Labs  Lab 06/27/19 1125 06/27/19 1638 06/27/19 2139 06/28/19 0610 06/28/19 1131  GLUCAP 184* 353* 375* 139* 341*   Lipid Profile: No results for input(s): CHOL, HDL, LDLCALC, TRIG, CHOLHDL, LDLDIRECT in the last 72 hours. Thyroid Function Tests: No results for input(s): TSH, T4TOTAL, FREET4, T3FREE, THYROIDAB in the last 72 hours. Anemia Panel: No results for input(s): VITAMINB12, FOLATE, FERRITIN, TIBC, IRON, RETICCTPCT in the last 72 hours. Urine analysis:    Component Value Date/Time   COLORURINE YELLOW 09/25/2012 [place] 09/25/2012 1506   LABSPEC 1.045 (H) 09/25/2012 1506   PHURINE 6.0 09/25/2012 1506   GLUCOSEU >1000 (A) 09/25/2012 1506   HGBUR NEGATIVE 09/25/2012 1506   BILIRUBINUR  NEGATIVE 09/25/2012 1506   KETONESUR NEGATIVE 09/25/2012 1506   PROTEINUR NEGATIVE 09/25/2012 1506   UROBILINOGEN 0.2 09/25/2012 1506   NITRITE NEGATIVE 09/25/2012 1506   LEUKOCYTESUR NEGATIVE 09/25/2012 1506   Sepsis Labs: @LABRCNTIP (procalcitonin:4,lacticidven:4)  ) Recent Results (from the past 240 hour(s))  SARS Coronavirus 2 (CEPHEID - Performed in [place] hospital lab), Hosp Order     Status: None   Collection Time: 06/26/19  1:15 PM   Specimen: Nasopharyngeal Swab  Result Value Ref Range Status   SARS Coronavirus 2 NEGATIVE NEGATIVE Final    Comment: (NOTE) If result is NEGATIVE SARS-CoV-2 target nucleic acids are NOT DETECTED. The SARS-CoV-2 RNA is generally detectable in upper and lower  respiratory specimens during the acute phase of infection. The lowest   concentration of SARS-CoV-2 viral copies this assay can detect is 250  copies / mL. A negative result does not preclude SARS-CoV-2 infection  and should not be used as the sole basis for treatment or other  patient management decisions.  A negative result may occur with  improper specimen collection / handling, submission of specimen other  than nasopharyngeal swab, presence of viral mutation(s) within the  areas targeted by this assay, and inadequate number of viral copies  (<250 copies / mL). A negative result must be combined with clinical  observations, patient history, and epidemiological information. If result is POSITIVE SARS-CoV-2 target nucleic acids are DETECTED. The SARS-CoV-2 RNA is generally detectable in upper and lower  respiratory specimens dur ing the acute phase of infection.  Positive  results are indicative of active infection with SARS-CoV-2.  Clinical  correlation with patient history and other diagnostic information is  necessary to determine patient infection status.  Positive results do  not rule out bacterial infection or co-infection with other viruses. If result is PRESUMPTIVE POSTIVE SARS-CoV-2 nucleic acids MAY BE PRESENT.   A presumptive positive result was obtained on the submitted specimen  and confirmed on repeat testing.  While 2019 novel coronavirus  (SARS-CoV-2) nucleic acids may be present in the submitted sample  additional confirmatory testing may be necessary for epidemiological  and / or clinical management purposes  to differentiate between  SARS-CoV-2 and other Sarbecovirus currently known to infect humans.  If clinically indicated additional testing with an alternate test  methodology [phone]) is advised. The SARS-CoV-2 RNA is generally  detectable in upper and lower respiratory sp ecimens during the acute  phase of infection. The expected result is Negative. Fact Sheet for Patients:  StrictlyIdeas.no Fact Sheet  for Healthcare Providers: BankingDealers.co.za This test is not yet approved or cleared by the [place] FDA and has been authorized for detection and/or diagnosis of SARS-CoV-2 by FDA under an Emergency Use Authorization (EUA).  This EUA will remain in effect (meaning this test can be used) for the duration of the COVID-19 declaration under Section 564(b)(1) of the Act, 21 U.S.C. section 360bbb-3(b)(1), unless the authorization is terminated or revoked sooner. Performed at [facility], [place] [address] [place], [place] [address]   Culture, blood (routine x 2)     Status: None (Preliminary result)   Collection Time: 06/26/19  2:46 PM   Specimen: BLOOD  Result Value Ref Range Status   Specimen Description BLOOD RIGHT ANTECUBITAL  Final   Special Requests   Final    BOTTLES DRAWN AEROBIC AND ANAEROBIC Blood Culture results may not be optimal due to an excessive volume of blood received in culture bottles   Culture   Final  NO GROWTH 2 DAYS Performed at [facility], [place] [address] [place], [place] [address]    Report Status PENDING  Incomplete  Culture, blood (routine x 2)     Status: None (Preliminary result)   Collection Time: 06/26/19  2:52 PM   Specimen: BLOOD LEFT FOREARM  Result Value Ref Range Status   Specimen Description BLOOD LEFT FOREARM  Final   Special Requests   Final    BOTTLES DRAWN AEROBIC AND ANAEROBIC Blood Culture adequate volume   Culture   Final    NO GROWTH 2 DAYS Performed at [facility], [place] [address], [place], [place] [address]    Report Status PENDING  Incomplete      Studies: No results found.  Scheduled Meds: . cholecalciferol  1,000 Units Oral Daily  . digoxin  0.125 mg Oral QHS  . escitalopram  10 mg Oral QPM  . ezetimibe  10 mg Oral Daily  . feeding supplement  1 Container Oral TID BM  . fenofibrate  160 mg Oral Daily  . hydrALAZINE  12.5 mg Oral BID  . Icosapent Ethyl  2 g Oral BID  .  insulin aspart  0-15 Units Subcutaneous TID WC  . insulin aspart  0-5 Units Subcutaneous QHS  . insulin glargine  25 Units Subcutaneous BID  . isosorbide mononitrate  30 mg Oral Daily  . metoprolol succinate  37.5 mg Oral BID  . mometasone-formoterol  2 puff Inhalation BID  . multivitamin  1 tablet Oral Daily  . nutrition supplement (JUVEN)  1 packet Oral BID BM  . saccharomyces boulardii  250 mg Oral Q M,W,F  . senna-docusate  2 tablet Oral BID  . vitamin C  1,000 mg Oral QPM    Continuous Infusions: . cefTRIAXone (ROCEPHIN)  IV 2 g (06/27/19 1345)  . metronidazole 500 mg (06/28/19 0832)  . vancomycin 1,000 mg (06/27/19 1648)     LOS: 2 days     Kayleen Memos, MD [facility] Pager [phone]  If 7PM-7AM, please contact night-coverage [email or web address] Password [email or web address] 06/28/2019, 11:57 AM

## 2019-06-28 NOTE — Progress Notes (Signed)
Progress Note  Patient Name: Joseph Hoffman Date of Encounter: 06/28/2019  Primary Cardiologist: Dr. Shelva Majestic  Subjective   Had recurrent chest discomfort last evening, otherwise feels better.  Not short of breath at rest.  Left leg discomfort somewhat less noticeable.  Inpatient Medications    Scheduled Meds: . cholecalciferol  1,000 Units Oral Daily  . digoxin  0.125 mg Oral QHS  . escitalopram  10 mg Oral QPM  . ezetimibe  10 mg Oral Daily  . feeding supplement  1 Container Oral TID BM  . fenofibrate  160 mg Oral Daily  . hydrALAZINE  12.5 mg Oral BID  . Icosapent Ethyl  2 g Oral BID  . insulin aspart  0-15 Units Subcutaneous TID WC  . insulin aspart  0-5 Units Subcutaneous QHS  . insulin glargine  25 Units Subcutaneous BID  . isosorbide mononitrate  30 mg Oral Daily  . metoprolol succinate  37.5 mg Oral BID  . mometasone-formoterol  2 puff Inhalation BID  . multivitamin  1 tablet Oral Daily  . nutrition supplement (JUVEN)  1 packet Oral BID BM  . saccharomyces boulardii  250 mg Oral Q M,W,F  . senna-docusate  2 tablet Oral BID  . vitamin C  1,000 mg Oral QPM   Continuous Infusions: . cefTRIAXone (ROCEPHIN)  IV 2 g (06/27/19 1345)  . metronidazole 500 mg (06/28/19 0832)  . vancomycin 1,000 mg (06/27/19 1648)   PRN Meds: acetaminophen **OR** acetaminophen, albuterol, HYDROmorphone (DILAUDID) injection, nitroGLYCERIN, oxyCODONE   Vital Signs    Vitals:   06/27/19 1913 06/28/19 0609 06/28/19 0749 06/28/19 0800  BP:  119/63  128/66  Pulse:  72  86  Resp:  20    Temp:      TempSrc:      SpO2: 93% 97% 97%   Weight:  112.8 kg    Height:        Intake/Output Summary (Last 24 hours) at 06/28/2019 0923 Last data filed at 06/28/2019 0500 Gross per 24 hour  Intake 551.52 ml  Output 2200 ml  Net -1648.48 ml   Filed Weights   06/26/19 1747 06/27/19 0454 06/28/19 0609  Weight: 113 kg 112.1 kg 112.8 kg    Telemetry    Atrial fibrillation.  Personally  reviewed.  ECG    Tracing from 06/26/2019 shows atrial fibrillation with IVCD, left anterior fascicular block, occasional PVCs.  Personally reviewed.  Physical Exam   GEN:  Obese male, no acute distress.   Neck: No JVD. Cardiac:  Irregularly irregular, soft systolic and diastolic murmurs, no gallop.  Respiratory: Nonlabored. Clear to auscultation bilaterally. GI: Soft, nontender, bowel sounds present. MS:  Left lower leg erythema; No deformity. Neuro:  Nonfocal. Psych: Alert and oriented x 3. Normal affect.  Labs    Chemistry Recent Labs  Lab 06/26/19 1315 06/27/19 0415 06/28/19 0602  NA 134* 135 134*  K 4.5 3.9 4.2  CL 98 96* 97*  CO2 21* 25 24  GLUCOSE 227* 170* 140*  BUN 46* 45* 45*  CREATININE 2.12* 2.19* 1.92*  CALCIUM 9.7 9.2 9.0  GFRNONAA 31* 29* 35*  GFRAA 35* 34* 40*  ANIONGAP 15 14 13      Hematology Recent Labs  Lab 06/26/19 1315 06/27/19 0415 06/28/19 0602  WBC 14.9* 12.4* 11.1*  RBC 5.02 5.14 4.85  HGB 13.0 13.2 12.5*  HCT 41.1 43.1 40.6  MCV 81.9 83.9 83.7  MCH 25.9* 25.7* 25.8*  MCHC 31.6 30.6 30.8  RDW 17.2* 17.2*  17.3*  PLT 240 266 237    BNP Recent Labs  Lab 06/26/19 1315  BNP 669.2*     Radiology    Dg Chest Portable 1 View  Result Date: 06/26/2019 CLINICAL DATA:  Shortness of breath for 3 days. EXAM: PORTABLE CHEST 1 VIEW COMPARISON:  05/27/2019 and older exams. FINDINGS: Stable changes from prior cardiac surgery. Cardiac silhouette is mildly enlarged. No mediastinal or hilar masses. Lungs show prominent bronchovascular markings similar to the prior study. No evidence of pneumonia. No convincing pleural effusion and no pneumothorax. Skeletal structures are grossly intact. IMPRESSION: 1. Cardiomegaly with prominent bronchovascular markings but no overt pulmonary edema. No evidence of pneumonia. Electronically Signed   By: Lajean Manes M.D.   On: 06/26/2019 13:42   Vas [place] Burnard Bunting With/wo Tbi  Result Date: 06/27/2019 LOWER EXTREMITY  DOPPLER STUDY Indications: Ulceration. High Risk Factors: Hypertension, hyperlipidemia, Diabetes. Other Factors: Permanemt A-Fib, CKD Stage 3, Chronic combined CHF.  Comparison Study: No previous exam available for comparison Performing Technologist: Birdena Crandall, [place] [facility]  Examination Guidelines: A complete evaluation includes at minimum, Doppler waveform signals and systolic blood pressure reading at the level of bilateral brachial, anterior tibial, and posterior tibial arteries, when vessel segments are accessible. Bilateral testing is considered an integral part of a complete examination. Photoelectric Plethysmograph (PPG) waveforms and toe systolic pressure readings are included as required and additional duplex testing as needed. Limited examinations for reoccurring indications may be performed as noted.  ABI Findings: +---------+------------------+-----+----------+--------+ Right    Rt Pressure (mmHg)IndexWaveform  Comment  +---------+------------------+-----+----------+--------+ Brachial 158                    triphasic          +---------+------------------+-----+----------+--------+ PTA      255               1.61 monophasic         +---------+------------------+-----+----------+--------+ DP       190               1.20 monophasic         +---------+------------------+-----+----------+--------+ Great Toe59                0.37                    +---------+------------------+-----+----------+--------+ +---------+------------------+-----+----------+-------------------------+ Left     Lt Pressure (mmHg)IndexWaveform  Comment                   +---------+------------------+-----+----------+-------------------------+ Brachial 148                    triphasic                           +---------+------------------+-----+----------+-------------------------+ PTA      115               0.73 monophasic                           +---------+------------------+-----+----------+-------------------------+ DP       134               0.85 monophasicprobably falsely elevated +---------+------------------+-----+----------+-------------------------+ Great Toe116               0.73                                     +---------+------------------+-----+----------+-------------------------+ +-------+-----------+-----------+------------+------------+  ABI/TBIToday's ABIToday's TBIPrevious ABIPrevious TBI +-------+-----------+-----------+------------+------------+ Right  n/c        0.37                                +-------+-----------+-----------+------------+------------+ Left   0.85       0.73                                +-------+-----------+-----------+------------+------------+ Difficult to evaluate the Doppler waveforms due to cardiac arrythmia and movement of the foot.  Summary: Right: Resting right ankle-brachial index indicates noncompressible right lower extremity arteries.The right toe-brachial index is abnormal. Difficult to evaluate the Doppler waveforms due to invountary movement of the foot and cardiac arrythmia. Left: Resting left ankle-brachial index indicates mild left lower extremity arterial disease. The left toe-brachial index is normal. Difficult to evaluate the Doppler waveforms due to cardiac arrythmia.  *See table(s) above for measurements and observations.  Electronically signed by Deitra Mayo MD on 06/27/2019 at 6:35:01 AM.    Final     Cardiac Studies   TEE 05/29/2019: 1. The left ventricle has moderate-severely reduced systolic function, with an ejection fraction of 30-35%. Left ventricular diffuse hypokinesis. 2. The right ventricle has severely reduced systolic function. The cavity was mildly enlarged. 3. Left atrial size was severely dilated. 4. Right atrial size was severely dilated. 5. The mitral valve is grossly normal. No evidence of mitral valve stenosis. 6.  The tricuspid valve was grossly normal. 7. Aortic valve regurgitation is severe by color flow Doppler. 8. There is evidence of mild plaque in the descending aorta. 9. Moderate to severe global reduction in LV systolic function; mild RVE with severe RV dysfunction; severe biatrial enlargement; s/p mechanical AVR with mean gradient of 14 mmHg (both leaflets appear to be mobile); severe perivalvular AI; mild MR and  TR.  Patient Profile     68 y.o. male with a history of permanent atrial fibrillation, chronic combined CHF, AVR in 2013, HTN, HLD, OSA on CPAP, DM type 2, CKD stage 3, and hepatitis.  He has had recurring biventricular heart failure in association with severe aortic insufficiency documented by recent TEE with plan for right and left heart catheterization.  He presents with chest pain.  Assessment & Plan    1.  Chest pain, recurrent but with somewhat atypical features.  High-sensitivity troponin I 99.  ECG is nonspecific.  2.  Acute on chronic biventricular heart failure.  LVEF is 30 to 35% and associated with severe aortic regurgitation complicating previous mechanical AVR.  Coumadin is held at this time.  INR down from 3.4-2.5.  Plan to start heparin when INR is under 2.5.  3.  CKD stage III with acute renal insufficiency, creatinine has improved down to 1.9.  Diuretics currently held.  4.  Permanent atrial fibrillation.  Heart rate is adequately controlled on metoprolol and Lanoxin.  4.  Left lower extremity cellulitis, on antibiotics per primary team.  Patient is afebrile.  5.  Mixed hyperlipidemia with statin intolerance.  He has been on of a Vascepa, Zetia, and fenofibrate.  6.  OSA.  Tentatively plan on right and left heart catheterization for tomorrow presuming renal function improves and INR continues to drift down.  Heparin will be started when INR is less than 2.5.  Continue to hold diuretics, otherwise no changes to current regimen.  Signed, Rozann Lesches, MD  06/28/2019, 9:23 AM

## 2019-06-28 NOTE — Progress Notes (Signed)
ANTICOAGULATION CONSULT NOTE - Initial Consult  Pharmacy Consult for Heparin Indication: atrial fibrillation  Allergies  Allergen Reactions  . Iohexol Anaphylaxis  . Niacin And Related     Flushing with immediate realese  . Penicillins Other (See Comments)    Unknown.[name]  Did it involve swelling of the face/tongue/throat, SOB, or low BP? Unknown Did it involve sudden or severe rash/hives, skin peeling, or any reaction on the inside of your mouth or nose? Unknown Did you need to seek medical attention at a hospital or doctor's office? Unknown When did it last happen?Childhood If all above answers are "NO", may proceed with cephalosporin use.    Patient Measurements: Height: 5\' 11"  (180.3 cm) Weight: 248 lb 11.2 oz (112.8 kg) IBW/kg (Calculated) : 75.3  Heparin dosing weight: 99.8 kg  Vital Signs: Temp: 98.2 F (36.8 C) (07/26 1129) Temp Source: Oral (07/26 1129) BP: 139/69 (07/26 1129) Pulse Rate: 102 (07/26 1129)  Labs: Recent Labs    06/26/19 1315 06/26/19 1446 06/27/19 0415 06/28/19 0602 06/28/19 1143  HGB 13.0  --  13.2 12.5*  --   HCT 41.1  --  43.1 40.6  --   PLT 240  --  266 237  --   LABPROT 37.3*  --  34.0* 26.2* 26.0*  INR 3.9*  --  3.4* 2.5* 2.4*  CREATININE 2.12*  --  2.19* 1.92*  --   TROPONINIHS 96* 99*  --   --   --     Estimated Creatinine Clearance: 45.7 mL/min (A) (by C-G formula based on SCr of 1.92 mg/dL (H)).   Assessment: 68 yo M presents with CP. He was on warfarin PTA for Afib and mechanical AVR in 09/2012 (last dose 7/23). Pharmacy consulted to dose heparin once INR < 2.5.  INR (2.4) today is therapeutic and within goal range to initiate heparin. Hgb 12.5, CBC stable. No bleeding documented.  PTA Warfarin: 10 mg daily except 12.5 mg on Tu/Th/Sa  Goal of Therapy:  Heparin level 0.3-0.7 units/ml  Monitor platelets by anticoagulation protocol: Yes   Plan:  Start IV heparin 1400 units/hr Check 8 hr  HL Daily HL, CBC while on heparin Monitor s/sx bleed Warfarin on hold until after procedures  [name] Service, PharmD PGY1 Pharmacy Resident Phone: [phone] 06/28/2019  1:41 PM  Please check [email or web address] for unit-specific pharmacy phone numbers.

## 2019-06-28 NOTE — Progress Notes (Signed)
   Vital Signs MEWS/VS Documentation      06/28/2019 0749 06/28/2019 0800 06/28/2019 1129 06/28/2019 1450   MEWS Score:  -  0  1  3   MEWS Score Color:  -  Green  Green  Yellow   Resp:  -  -  20  -   Pulse:  -  86  (!) 102  -   BP:  -  128/66  139/69  -   Temp:  -  -  98.2 F (36.8 C)  -   O2 Device:  Room Air  -  Room Air  -           Julianne Handler 06/28/2019,7:08 PM

## 2019-06-28 NOTE — Progress Notes (Signed)
Inpatient Diabetes Program Recommendations  AACE/ADA: New Consensus Statement on Inpatient Glycemic Control (2015)  Target Ranges:  Prepandial:   less than 140 mg/dL      Peak postprandial:   less than 180 mg/dL (1-2 hours)      Critically ill patients:  140 - 180 mg/dL   Lab Results  Component Value Date   GLUCAP 139 (H) 06/28/2019   HGBA1C 8.7 (H) 05/13/2019    Review of Glycemic Control Results for Joseph Hoffman, Joseph Hoffman (MRN [id number]) as of 06/28/2019 09:11  Ref. Range 06/27/2019 11:25 06/27/2019 16:38 06/27/2019 21:39 06/28/2019 06:10  Glucose-Capillary Latest Ref Range: 70 - 99 mg/dL 184 (H) 353 (H) 375 (H) 139 (H)   Diabetes history: Type 2 DM Outpatient Diabetes medications: Jardiance 25 mg QD, Amaryl 2 mg QAM, Lantus 15-40 units QAM, 30-40 units QHS, Victoza 1.8 mg QHS Current orders for Inpatient glycemic control: Novolog 0-15 units TID, novolog 0-5 units QHS, Lantus 25 units BID  Inpatient Diabetes Program Recommendations:    Consider adding Novolog 5 units TID (assuming patient is consuming >50% of meal).   Thanks, Bronson Curb, MSN, RNC-OB Diabetes Coordinator [phone] (8a-5p)

## 2019-06-29 ENCOUNTER — Encounter (HOSPITAL_COMMUNITY): Payer: Self-pay | Admitting: Internal Medicine

## 2019-06-29 ENCOUNTER — Ambulatory Visit: Payer: Medicare Other | Admitting: Physician Assistant

## 2019-06-29 ENCOUNTER — Other Ambulatory Visit: Payer: Self-pay | Admitting: *Deleted

## 2019-06-29 ENCOUNTER — Inpatient Hospital Stay (HOSPITAL_COMMUNITY)
Admission: EM | Disposition: A | Discharge status: Home / Self Care | Payer: Self-pay | Source: Home / Self Care | Attending: Internal Medicine

## 2019-06-29 DIAGNOSIS — I350 Nonrheumatic aortic (valve) stenosis: Secondary | ICD-10-CM

## 2019-06-29 DIAGNOSIS — I359 Nonrheumatic aortic valve disorder, unspecified: Secondary | ICD-10-CM | POA: Diagnosis present

## 2019-06-29 DIAGNOSIS — I351 Nonrheumatic aortic (valve) insufficiency: Secondary | ICD-10-CM | POA: Diagnosis present

## 2019-06-29 DIAGNOSIS — R0602 Shortness of breath: Secondary | ICD-10-CM

## 2019-06-29 DIAGNOSIS — I2510 Atherosclerotic heart disease of native coronary artery without angina pectoris: Secondary | ICD-10-CM

## 2019-06-29 HISTORY — PX: RIGHT HEART CATH AND CORONARY ANGIOGRAPHY: CATH118264

## 2019-06-29 LAB — CBC
HCT: 39.9 % (ref 39.0–52.0)
Hemoglobin: 12.8 g/dL — ABNORMAL LOW (ref 13.0–17.0)
MCH: 26.1 pg (ref 26.0–34.0)
MCHC: 32.1 g/dL (ref 30.0–36.0)
MCV: 81.3 fL (ref 80.0–100.0)
Platelets: 253 10*3/uL (ref 150–400)
RBC: 4.91 MIL/uL (ref 4.22–5.81)
RDW: 17.2 % — ABNORMAL HIGH (ref 11.5–15.5)
WBC: 12.2 10*3/uL — ABNORMAL HIGH (ref 4.0–10.5)
nRBC: 0 % (ref 0.0–0.2)

## 2019-06-29 LAB — POCT I-STAT 7, (LYTES, BLD GAS, ICA,H+H)
Bicarbonate: 24.1 mmol/L (ref 20.0–28.0)
Calcium, Ion: 1.19 mmol/L (ref 1.15–1.40)
HCT: 36 % — ABNORMAL LOW (ref 39.0–52.0)
Hemoglobin: 12.2 g/dL — ABNORMAL LOW (ref 13.0–17.0)
O2 Saturation: 97 %
Potassium: 3.5 mmol/L (ref 3.5–5.1)
Sodium: 136 mmol/L (ref 135–145)
TCO2: 25 mmol/L (ref 22–32)
pCO2 arterial: 36.4 mmHg (ref 32.0–48.0)
pH, Arterial: 7.43 (ref 7.350–7.450)
pO2, Arterial: 91 mmHg (ref 83.0–108.0)

## 2019-06-29 LAB — POCT I-STAT EG7
Acid-Base Excess: 1 mmol/L (ref 0.0–2.0)
Bicarbonate: 26.1 mmol/L (ref 20.0–28.0)
Calcium, Ion: 1.2 mmol/L (ref 1.15–1.40)
HCT: 37 % — ABNORMAL LOW (ref 39.0–52.0)
Hemoglobin: 12.6 g/dL — ABNORMAL LOW (ref 13.0–17.0)
O2 Saturation: 62 %
Potassium: 3.6 mmol/L (ref 3.5–5.1)
Sodium: 136 mmol/L (ref 135–145)
TCO2: 27 mmol/L (ref 22–32)
pCO2, Ven: 41.4 mmHg — ABNORMAL LOW (ref 44.0–60.0)
pH, Ven: 7.407 (ref 7.250–7.430)
pO2, Ven: 32 mmHg (ref 32.0–45.0)

## 2019-06-29 LAB — GLUCOSE, CAPILLARY
Glucose-Capillary: 97 mg/dL (ref 70–99)
Glucose-Capillary: 127 mg/dL — ABNORMAL HIGH (ref 70–99)
Glucose-Capillary: 417 mg/dL — ABNORMAL HIGH (ref 70–99)
Glucose-Capillary: 494 mg/dL — ABNORMAL HIGH (ref 70–99)
Glucose-Capillary: 485 mg/dL — ABNORMAL HIGH (ref 70–99)

## 2019-06-29 LAB — BASIC METABOLIC PANEL
Anion gap: 15 (ref 5–15)
BUN: 57 mg/dL — ABNORMAL HIGH (ref 8–23)
CO2: 21 mmol/L — ABNORMAL LOW (ref 22–32)
Calcium: 9.1 mg/dL (ref 8.9–10.3)
Chloride: 97 mmol/L — ABNORMAL LOW (ref 98–111)
Creatinine, Ser: 1.96 mg/dL — ABNORMAL HIGH (ref 0.61–1.24)
GFR calc Af Amer: 39 mL/min — ABNORMAL LOW (ref >60)
GFR calc non Af Amer: 34 mL/min — ABNORMAL LOW (ref >60)
Glucose, Bld: 98 mg/dL (ref 70–99)
Potassium: 3.6 mmol/L (ref 3.5–5.1)
Sodium: 133 mmol/L — ABNORMAL LOW (ref 135–145)

## 2019-06-29 LAB — PROTIME-INR
INR: 1.7 — ABNORMAL HIGH (ref 0.8–1.2)
Prothrombin Time: 19.6 seconds — ABNORMAL HIGH (ref 11.4–15.2)

## 2019-06-29 LAB — SEDIMENTATION RATE: Sed Rate: 25 mm/hr — ABNORMAL HIGH (ref 0–16)

## 2019-06-29 LAB — HEPARIN LEVEL (UNFRACTIONATED): Heparin Unfractionated: 0.16 IU/mL — ABNORMAL LOW (ref 0.30–0.70)

## 2019-06-29 LAB — C-REACTIVE PROTEIN: CRP: 9.5 mg/dL — ABNORMAL HIGH (ref <1.0)

## 2019-06-29 SURGERY — RIGHT HEART CATH AND CORONARY ANGIOGRAPHY
Anesthesia: LOCAL

## 2019-06-29 MED ORDER — HEPARIN (PORCINE) IN NACL 1000-0.9 UT/500ML-% IV SOLN
INTRAVENOUS | Status: AC
  Filled 2019-06-29: qty 500

## 2019-06-29 MED ORDER — SODIUM CHLORIDE 0.9% FLUSH: 3.0000 mL | Freq: Two times a day (BID) | INTRAVENOUS | Status: DC

## 2019-06-29 MED ORDER — LOPERAMIDE HCL 2 MG PO CAPS
2.0000 mg | ORAL_CAPSULE | ORAL | Status: DC | PRN
  Administered 2019-06-29 – 2019-07-08 (×12): 2 mg via ORAL
  Filled 2019-06-29 (×13): qty 1

## 2019-06-29 MED ORDER — SODIUM CHLORIDE 0.9 % IV SOLN
INTRAVENOUS | Status: DC
  Administered 2019-06-29 – 2019-06-30 (×2): via INTRAVENOUS

## 2019-06-29 MED ORDER — ASPIRIN 81 MG PO CHEW
81.0000 mg | CHEWABLE_TABLET | ORAL | Status: AC
  Administered 2019-06-29: 81 mg via ORAL
  Filled 2019-06-29: qty 1

## 2019-06-29 MED ORDER — HEPARIN SODIUM (PORCINE) 1000 UNIT/ML IJ SOLN
INTRAMUSCULAR | Status: AC
  Filled 2019-06-29: qty 1

## 2019-06-29 MED ORDER — METHYLPREDNISOLONE SODIUM SUCC 125 MG IJ SOLR
125.0000 mg | Freq: Once | INTRAMUSCULAR | Status: AC
  Administered 2019-06-29: 125 mg via INTRAVENOUS
  Filled 2019-06-29: qty 2

## 2019-06-29 MED ORDER — INSULIN ASPART 100 UNIT/ML ~~LOC~~ SOLN
18.0000 [IU] | Freq: Once | SUBCUTANEOUS | Status: AC
  Administered 2019-06-29: 18 [IU] via SUBCUTANEOUS

## 2019-06-29 MED ORDER — DIPHENHYDRAMINE HCL 25 MG PO CAPS
50.0000 mg | ORAL_CAPSULE | Freq: Once | ORAL | Status: AC
  Administered 2019-06-29: 50 mg via ORAL
  Filled 2019-06-29: qty 2

## 2019-06-29 MED ORDER — SODIUM CHLORIDE 0.9% FLUSH
3.0000 mL | Freq: Two times a day (BID) | INTRAVENOUS | Status: DC
  Administered 2019-06-29 – 2019-07-04 (×8): 3 mL via INTRAVENOUS

## 2019-06-29 MED ORDER — VERAPAMIL HCL 2.5 MG/ML IV SOLN
INTRAVENOUS | Status: AC
  Filled 2019-06-29: qty 2

## 2019-06-29 MED ORDER — SODIUM CHLORIDE 0.9 % IV SOLN
250.0000 mL | INTRAVENOUS | Status: DC | PRN
  Administered 2019-07-01: 250 mL via INTRAVENOUS

## 2019-06-29 MED ORDER — SODIUM CHLORIDE 0.9 % WEIGHT BASED INFUSION: 1.0000 mL/kg/h | INTRAVENOUS | Status: DC

## 2019-06-29 MED ORDER — MIDAZOLAM HCL 2 MG/2ML IJ SOLN
INTRAMUSCULAR | Status: AC
  Filled 2019-06-29: qty 2

## 2019-06-29 MED ORDER — INSULIN GLARGINE 100 UNIT/ML ~~LOC~~ SOLN
34.0000 [IU] | Freq: Two times a day (BID) | SUBCUTANEOUS | Status: DC
  Administered 2019-06-29 – 2019-07-01 (×4): 34 [IU] via SUBCUTANEOUS
  Filled 2019-06-29 (×5): qty 0.34

## 2019-06-29 MED ORDER — SODIUM CHLORIDE 0.9% FLUSH: 3.0000 mL | INTRAVENOUS | Status: DC | PRN

## 2019-06-29 MED ORDER — LIDOCAINE HCL (PF) 1 % IJ SOLN
INTRAMUSCULAR | Status: DC | PRN
  Administered 2019-06-29: 2 mL
  Administered 2019-06-29 (×2): 2 mL via SUBCUTANEOUS

## 2019-06-29 MED ORDER — SODIUM CHLORIDE 0.9 % IV SOLN
INTRAVENOUS | Status: AC
  Administered 2019-06-29: 12:00:00 via INTRAVENOUS

## 2019-06-29 MED ORDER — HEPARIN (PORCINE) IN NACL 1000-0.9 UT/500ML-% IV SOLN
INTRAVENOUS | Status: DC | PRN
  Administered 2019-06-29 (×3): 500 mL

## 2019-06-29 MED ORDER — INSULIN GLARGINE 100 UNIT/ML ~~LOC~~ SOLN: 34.0000 [IU] | Freq: Two times a day (BID) | SUBCUTANEOUS | Status: DC

## 2019-06-29 MED ORDER — FENTANYL CITRATE (PF) 100 MCG/2ML IJ SOLN
INTRAMUSCULAR | Status: AC
  Filled 2019-06-29: qty 2

## 2019-06-29 MED ORDER — LOPERAMIDE HCL 2 MG PO CAPS
2.0000 mg | ORAL_CAPSULE | Freq: Once | ORAL | Status: AC
  Administered 2019-06-29: 2 mg via ORAL

## 2019-06-29 MED ORDER — HEPARIN (PORCINE) 25000 UT/250ML-% IV SOLN
1750.0000 [IU]/h | INTRAVENOUS | Status: DC
  Administered 2019-06-29: 1700 [IU]/h via INTRAVENOUS
  Administered 2019-06-30 – 2019-07-01 (×3): 1750 [IU]/h via INTRAVENOUS
  Filled 2019-06-29 (×4): qty 250

## 2019-06-29 MED ORDER — SODIUM CHLORIDE 0.9 % WEIGHT BASED INFUSION: 3.0000 mL/kg/h | INTRAVENOUS | Status: DC

## 2019-06-29 MED ORDER — VERAPAMIL HCL 2.5 MG/ML IV SOLN
INTRAVENOUS | Status: DC | PRN
  Administered 2019-06-29: 11:00:00 10 mL via INTRA_ARTERIAL

## 2019-06-29 MED ORDER — HYDRALAZINE HCL 20 MG/ML IJ SOLN: 10.0000 mg | INTRAMUSCULAR | Status: AC | PRN

## 2019-06-29 MED ORDER — DIPHENHYDRAMINE HCL 50 MG/ML IJ SOLN
50.0000 mg | Freq: Once | INTRAMUSCULAR | Status: AC
  Filled 2019-06-29: qty 1

## 2019-06-29 MED ORDER — SODIUM CHLORIDE 0.9 % IV SOLN: 250.0000 mL | INTRAVENOUS | Status: DC | PRN

## 2019-06-29 MED ORDER — LIDOCAINE HCL (PF) 1 % IJ SOLN
INTRAMUSCULAR | Status: AC
  Filled 2019-06-29: qty 30

## 2019-06-29 MED ORDER — IOHEXOL 350 MG/ML SOLN
INTRAVENOUS | Status: DC | PRN
  Administered 2019-06-29: 50 mL via INTRAVENOUS

## 2019-06-29 MED ORDER — HEPARIN SODIUM (PORCINE) 1000 UNIT/ML IJ SOLN
INTRAMUSCULAR | Status: DC | PRN
  Administered 2019-06-29: 5000 [IU] via INTRAVENOUS

## 2019-06-29 SURGICAL SUPPLY — 14 items
BAND ZEPHYR COMPRESS 30 LONG (HEMOSTASIS) ×1 IMPLANT
CATH BALLN WEDGE 5F 110CM (CATHETERS) ×1 IMPLANT
CATH INFINITI 5FR MULTPACK ANG (CATHETERS) ×1 IMPLANT
COVER DOME SNAP 22 D (MISCELLANEOUS) ×1 IMPLANT
ELECT DEFIB PAD ADLT CADENCE (PAD) ×1 IMPLANT
KIT HEART LEFT (KITS) ×2 IMPLANT
PACK CARDIAC CATHETERIZATION (CUSTOM PROCEDURE TRAY) ×2 IMPLANT
SHEATH PROBE COVER 6X72 (BAG) ×1 IMPLANT
SHEATH RAIN 4/5FR (SHEATH) ×1 IMPLANT
SHEATH RAIN RADIAL 21G 6FR (SHEATH) ×1 IMPLANT
SHIELD RADPAD SCOOP 12X17 (MISCELLANEOUS) ×1 IMPLANT
TRANSDUCER W/STOPCOCK (MISCELLANEOUS) ×2 IMPLANT
TUBING CIL FLEX 10 FLL-RA (TUBING) ×2 IMPLANT
WIRE EMERALD 3MM-J .025X260CM (WIRE) ×1 IMPLANT

## 2019-06-29 NOTE — Progress Notes (Addendum)
PROGRESS NOTE  Joseph Hoffman CBJ:[id number] DOB: [birth year] DOA: 06/26/2019 PCP: Orpah Melter, MD  HPI/Recap of past 24 hours: Joseph Hoffman  is a 68 y.o. male, with medical history significant ofhistory significant ofhypertension, diabetes mellitus, asthma,sCHFwith EF 45%, CAD, atrial fibrillation on Coumadin,AS, s/p ofAVR with mechanical valveon coumadin, OSA on CPAP, CKD stage III, depression with anxiety, who presents with shortness of breath, and chest pain, as well he reports worsening left great toe ulcer with surrounding cellulitis -Patient reports he started to complain of chest pressure over last 48 hours, accompanied by dyspnea, mainly upon exertion, he did take sublingual nitro, with improvement of his chest pain, but today reported did not have any significant event on his chest pain, patient with known mechanical aortic valve insufficiency, plan to have urgent cath by Dr. Claiborne Billings next week, for anticipated aortic valve replacement by CT surgery next month. -Patient was noted to have left lower extremity diabetic great toe ulcer, report is been going on for last 2 weeks, with some drainage, as well with surrounding cellulitis, he denies any fever, chills, he did not seek any medical care for it. - in ED patient was hypertensive, mildly tachypneic, retinae elevated at 2.1 from baseline 1.8, mild leukocytosis at 14 point 9K, high-sensitivity troponins were elevated at 96>99, INR supratherapeutic at 3.9, COVID-19 is negative, EKG showing A. fib, patient was seen by cardiology for chest pain, and hospitalist were requested to admit for multiple medical problems, with a presentation of infected diabetic ulcer.  06/28/19: Patient was seen and examined at his bedside this morning.  He denies chest pain, dyspnea or palpitations.  Planned right and left heart cath today.    Assessment/Plan: Active Problems:   Chronic anticoagulation   AS (aortic stenosis)   Essential hypertension  Acute combined systolic and diastolic heart failure (HCC)   CKD (chronic kidney disease), stage III (HCC)   Acute on chronic systolic (congestive) heart failure (HCC)   Depression   Foot ulcer, left (HCC)   Aortic valve regurgitation  Sepsis secondary to L great toe diabetic ulcer with surrounding cellulitis Presented with leukocytosis with WBC 14 K, tachypnea with respiratory rate 31 Leukocytosis noted this morning, afebrile Continue broad-spectrum IV antibiotics IV Vancomycin, IV Flagyl, IV Rocephin  Left great toe diabetic ulcer with surrounding cellulitis in the setting of PVD We will need to rule out osteomyelitis: Ordered inflammatory markers ESR and CRP for further evaluation.  If significantly elevated will obtain MRI left great toe. Positive ABI When stable from a cardiac standpoint we will consult vascular surgery and orthopedic surgery Continue broad-spectrum IV antibiotics empirically  Leukocytosis WBC noted to be trending up this morning 12K from 11K Afebrile Blood cultures negative x2 to date Repeat CBC with differential tomorrow  AKI on CKD3 Baseline creatinine appears to be 1.8 with GFR of 37 Presented with creatinine of 2.19 with GFR of 29 Cr 1.98 from 1.92 from 2.19 Off diuretics due to AKI Avoid nephrotoxins Net I&O -5.2L since admission  Subtherapeutic INR INR today 1.7, off Coumadin Started heparin drip due to possible procedure: Right and left heart cath.  Acute on chronic combined diastolic and systolic CHF Presented with BNP greater than 600 Cardiology following Continue cardiac medications as recommended by cardiology Right and left heart cath planned 06/29/19  Acute on chronic hypoxic respiratory failure likely secondary to acute on chronic combined CHF Currently on 3 L with sats 94% Self reports on home O2 supplementation only at night  Severe aortic regurgitation  post mechanical AVR On Coumadin, held due to planned heart cath  Chest pain,  resolved   Permanent atrial fibrillation Currently rate controlled Off Coumadin, held due to possible procedure   Heparin drip when INR is less than 2.5  OSA Continue CPAP at night  Hyperlipidemia Intolerant to statin On fenofibrate, icosapent, and zetia     Code Status: Full code  Family Communication: We will call family if okay with the patient.  Disposition Plan: Discharge to home possibly in 1-2 days or when cardiology signs off.  Consultants:  Cardiology  Procedures:  None  Antimicrobials:  IV vancomycin, Rocephin, IV Flagyl  DVT prophylaxis: Heparin drip.   Objective: Vitals:   06/29/19 1114 06/29/19 1119 06/29/19 1156 06/29/19 1202  BP: (!) 130/59 133/65 (!) 128/58 (!) 119/52  Pulse: (!) 56 63 76 68  Resp: 11 18    Temp:      TempSrc:      SpO2: 96% 96% 93%   Weight:      Height:        Intake/Output Summary (Last 24 hours) at 06/29/2019 1221 Last data filed at 06/29/2019 0856 Gross per 24 hour  Intake 1452.85 ml  Output 1790 ml  Net -337.15 ml   Filed Weights   06/27/19 0454 06/28/19 0609 06/29/19 0610  Weight: 112.1 kg 112.8 kg 114.2 kg    Exam:  . General: 68 y.o. year-old male well-developed well-nourished in no acute distress.  Alert and oriented x3.   . Cardiovascular: Irregular rate and rhythm no rubs or gallops noted thyromegaly.   [name] Respiratory: Clear to auscultation no wheezes no rales poor inspiratory effort.  . Abdomen: Obese nontender nondistended normal bowel sounds present.  . Musculoskeletal: Bilateral trace lower extremity edema.  24 pulses in all 4 extremities. . Skin: Left dorsal foot erythema is improving.  Left great toe with necrotic tissue. [name] Psychiatry: Mood is appropriate for condition and setting.  Data Reviewed: CBC: Recent Labs  Lab 06/26/19 1315 06/27/19 0415 06/28/19 0602 06/29/19 0311  WBC 14.9* 12.4* 11.1* 12.2*  NEUTROABS  --   --  7.8*  --   HGB 13.0 13.2 12.5* 12.8*  HCT 41.1 43.1 40.6 39.9   MCV 81.9 83.9 83.7 81.3  PLT 240 266 237 254   Basic Metabolic Panel: Recent Labs  Lab 06/26/19 1315 06/27/19 0415 06/28/19 0602 06/29/19 0311  NA 134* 135 134* 133*  K 4.5 3.9 4.2 3.6  CL 98 96* 97* 97*  CO2 21* 25 24 21*  GLUCOSE 227* 170* 140* 98  BUN 46* 45* 45* 57*  CREATININE 2.12* 2.19* 1.92* 1.96*  CALCIUM 9.7 9.2 9.0 9.1   GFR: Estimated Creatinine Clearance: 45.1 mL/min (A) (by C-G formula based on SCr of 1.96 mg/dL (H)). Liver Function Tests: No results for input(s): AST, ALT, ALKPHOS, BILITOT, PROT, ALBUMIN in the last 168 hours. No results for input(s): LIPASE, AMYLASE in the last 168 hours. No results for input(s): AMMONIA in the last 168 hours. Coagulation Profile: Recent Labs  Lab 06/26/19 1315 06/27/19 0415 06/28/19 0602 06/28/19 1143 06/29/19 0311  INR 3.9* 3.4* 2.5* 2.4* 1.7*   Cardiac Enzymes: No results for input(s): CKTOTAL, CKMB, CKMBINDEX, TROPONINI in the last 168 hours. BNP (last 3 results) No results for input(s): PROBNP in the last 8760 hours. HbA1C: No results for input(s): HGBA1C in the last 72 hours. CBG: Recent Labs  Lab 06/28/19 1131 06/28/19 1639 06/28/19 2115 06/29/19 0610 06/29/19 1151  GLUCAP 341* 481* 290* 97 127*  Lipid Profile: No results for input(s): CHOL, HDL, LDLCALC, TRIG, CHOLHDL, LDLDIRECT in the last 72 hours. Thyroid Function Tests: No results for input(s): TSH, T4TOTAL, FREET4, T3FREE, THYROIDAB in the last 72 hours. Anemia Panel: No results for input(s): VITAMINB12, FOLATE, FERRITIN, TIBC, IRON, RETICCTPCT in the last 72 hours. Urine analysis:    Component Value Date/Time   COLORURINE YELLOW 09/25/2012 1506   APPEARANCEUR CLEAR 09/25/2012 1506   LABSPEC 1.045 (H) 09/25/2012 1506   PHURINE 6.0 09/25/2012 1506   GLUCOSEU >1000 (A) 09/25/2012 1506   HGBUR NEGATIVE 09/25/2012 1506   BILIRUBINUR NEGATIVE 09/25/2012 1506   KETONESUR NEGATIVE 09/25/2012 1506   PROTEINUR NEGATIVE 09/25/2012 1506    UROBILINOGEN 0.2 09/25/2012 1506   NITRITE NEGATIVE 09/25/2012 1506   LEUKOCYTESUR NEGATIVE 09/25/2012 1506   Sepsis Labs: @LABRCNTIP (procalcitonin:4,lacticidven:4)  ) Recent Results (from the past 240 hour(s))  SARS Coronavirus 2 (CEPHEID - Performed in [facility] hospital lab), Hosp Order     Status: None   Collection Time: 06/26/19  1:15 PM   Specimen: Nasopharyngeal Swab  Result Value Ref Range Status   SARS Coronavirus 2 NEGATIVE NEGATIVE Final    Comment: (NOTE) If result is NEGATIVE SARS-CoV-2 target nucleic acids are NOT DETECTED. The SARS-CoV-2 RNA is generally detectable in upper and lower  respiratory specimens during the acute phase of infection. The lowest  concentration of SARS-CoV-2 viral copies this assay can detect is 250  copies / mL. A negative result does not preclude SARS-CoV-2 infection  and should not be used as the sole basis for treatment or other  patient management decisions.  A negative result may occur with  improper specimen collection / handling, submission of specimen other  than nasopharyngeal swab, presence of viral mutation(s) within the  areas targeted by this assay, and inadequate number of viral copies  (<250 copies / mL). A negative result must be combined with clinical  observations, patient history, and epidemiological information. If result is POSITIVE SARS-CoV-2 target nucleic acids are DETECTED. The SARS-CoV-2 RNA is generally detectable in upper and lower  respiratory specimens dur ing the acute phase of infection.  Positive  results are indicative of active infection with SARS-CoV-2.  Clinical  correlation with patient history and other diagnostic information is  necessary to determine patient infection status.  Positive results do  not rule out bacterial infection or co-infection with other viruses. If result is PRESUMPTIVE POSTIVE SARS-CoV-2 nucleic acids MAY BE PRESENT.   A presumptive positive result was obtained on the  submitted specimen  and confirmed on repeat testing.  While 2019 novel coronavirus  (SARS-CoV-2) nucleic acids may be present in the submitted sample  additional confirmatory testing may be necessary for epidemiological  and / or clinical management purposes  to differentiate between  SARS-CoV-2 and other Sarbecovirus currently known to infect humans.  If clinically indicated additional testing with an alternate test  methodology [phone]) is advised. The SARS-CoV-2 RNA is generally  detectable in upper and lower respiratory sp ecimens during the acute  phase of infection. The expected result is Negative. Fact Sheet for Patients:  StrictlyIdeas.no Fact Sheet for Healthcare Providers: BankingDealers.co.za This test is not yet approved or cleared by the [place] FDA and has been authorized for detection and/or diagnosis of SARS-CoV-2 by FDA under an Emergency Use Authorization (EUA).  This EUA will remain in effect (meaning this test can be used) for the duration of the COVID-19 declaration under Section 564(b)(1) of the Act, 21 U.S.C. section 360bbb-3(b)(1), unless the  authorization is terminated or revoked sooner. Performed at [facility], [place] [address] [place], [place] [address]   Culture, blood (routine x 2)     Status: None (Preliminary result)   Collection Time: 06/26/19  2:46 PM   Specimen: BLOOD  Result Value Ref Range Status   Specimen Description BLOOD RIGHT ANTECUBITAL  Final   Special Requests   Final    BOTTLES DRAWN AEROBIC AND ANAEROBIC Blood Culture results may not be optimal due to an excessive volume of blood received in culture bottles   Culture   Final    NO GROWTH 3 DAYS Performed at [facility], [place] [address], [place], [place] [address]    Report Status PENDING  Incomplete  Culture, blood (routine x 2)     Status: None (Preliminary result)   Collection Time: 06/26/19  2:52 PM   Specimen:  BLOOD LEFT FOREARM  Result Value Ref Range Status   Specimen Description BLOOD LEFT FOREARM  Final   Special Requests   Final    BOTTLES DRAWN AEROBIC AND ANAEROBIC Blood Culture adequate volume   Culture   Final    NO GROWTH 3 DAYS Performed at [facility], [place] [address], [place], [place] [address]    Report Status PENDING  Incomplete      Studies: No results found.  Scheduled Meds: . cholecalciferol  1,000 Units Oral Daily  . digoxin  0.125 mg Oral QHS  . escitalopram  10 mg Oral QPM  . ezetimibe  10 mg Oral Daily  . feeding supplement  1 Container Oral TID BM  . fenofibrate  160 mg Oral Daily  . hydrALAZINE  12.5 mg Oral BID  . Icosapent Ethyl  2 g Oral BID  . insulin aspart  0-15 Units Subcutaneous TID WC  . insulin aspart  0-5 Units Subcutaneous QHS  . insulin aspart  10 Units Subcutaneous TID WC  . insulin glargine  30 Units Subcutaneous BID  . isosorbide mononitrate  30 mg Oral Daily  . metoprolol succinate  37.5 mg Oral BID  . mometasone-formoterol  2 puff Inhalation BID  . multivitamin  1 tablet Oral Daily  . nutrition supplement (JUVEN)  1 packet Oral BID BM  . saccharomyces boulardii  250 mg Oral Q M,W,F  . senna-docusate  2 tablet Oral BID  . sodium chloride flush  3 mL Intravenous Q12H  . vitamin C  1,000 mg Oral QPM    Continuous Infusions: . sodium chloride 50 mL/hr at 06/29/19 0814  . sodium chloride 50 mL/hr at 06/29/19 1206  . sodium chloride    . cefTRIAXone (ROCEPHIN)  IV Stopped (06/28/19 1552)  . metronidazole 500 mg (06/29/19 0514)  . vancomycin Stopped (06/28/19 1757)     LOS: 3 days     Kayleen Memos, MD [facility] Pager [phone]  If 7PM-7AM, please contact night-coverage [email or web address] Password [facility] 06/29/2019, 12:21 PM

## 2019-06-29 NOTE — Interval H&P Note (Signed)
History and Physical Interval Note:  06/29/2019 9:50 AM  Joseph Hoffman  has presented today for cardiac catheterization, with the diagnosis of unstable angina.  The various methods of treatment have been discussed with the patient and family. After consideration of risks, benefits and other options for treatment, the patient has consented to  Procedure(s): RIGHT/LEFT HEART CATH AND CORONARY ANGIOGRAPHY (N/A) as a surgical intervention.  The patient's history has been reviewed, patient examined, no change in status, stable for surgery.  I have reviewed the patient's chart and labs.  Questions were answered to the patient's satisfaction.    Cath Lab Visit (complete for each Cath Lab visit)  Clinical Evaluation Leading to the Procedure:   ACS: Yes.    Non-ACS:  N/A  [name]

## 2019-06-29 NOTE — Care Management Important Message (Signed)
Important Message  Patient Details  Name: Joseph Hoffman MRN: [id number] Date of Birth: [birth year]   Medicare Important Message Given:  Yes     [name] 06/29/2019, 1:16 PM

## 2019-06-29 NOTE — Consult Note (Signed)
I have placed a request via Secure Chat to Dr. Nevada Crane requesting photos of the wound areas of concern to be placed in the EMR.    [place], [place], [place]

## 2019-06-29 NOTE — Progress Notes (Signed)
Patient CBG 494.  RN paged [facility] to notify for new insulin orders.

## 2019-06-29 NOTE — Consult Note (Signed)
Called to see pt regarding toe wound Currently having BM Will see tomorrow am  Ruta Hinds, MD [facility] Office: [phone] Pager: [phone]

## 2019-06-29 NOTE — Consult Note (Signed)
Patient name: Joseph Hoffman MRN: [id number] DOB: 1951/02/18 Sex: male  Referring physician: Francia Greaves  REASON FOR CONSULT: Gangrene left first toe  HPI: Joseph Hoffman is a 68 y.o. male, who presented with severely swollen bilateral lower extremities from congestive heart failure in May 2020.  At that time he developed an ulcer on the plantar aspect of his left first toe.  This has slowly deteriorated and worsened.  He now has gangrenous changes at the plantar aspect of the toe.  He has severe cardiac dysfunction and is scheduled for aortic valve replacement and coronary artery bypass grafting next week.  He denies any fever or chills.  Other medical problems include aortic stenosis, multivessel coronary disease, congestive heart failure, diabetes, hypertension, atrial fibrillation.  These are all currently controlled.  However, the patient has had recent congestive failure.  He also has CKD 2.  He had a cardiac catheterization 24 hours ago.  This was done through a left brachial approach.  He is currently on a heparin drip.  He does have history of contrast reaction with anaphylaxis.  He was treated with Solu-Medrol and Benadryl yesterday without any event.  Past Medical History:  Diagnosis Date  . Anxiety   . Aortic stenosis   . Arthritis   . Asthma   . Asymptomatic LV dysfunction, EF 45%, normal coronary arteries on cardiac cath 09/18/12 09/30/2012  . CHF (congestive heart failure) ([place])   . Chronic anticoagulation, on Xarelto prior to admit 09/30/2012  . DM (diabetes mellitus) ([place]) 09/30/2012  . Hepatitis 2003  . Hypertension   . Myocardial infarction ([place])   . Permanent atrial fibrillation, since 1994 09/30/2012   stress test 02/08/12- normal study, no significant ischemia  . S/P AVR (aortic valve replacement), 09/30/2012   a. s/p mechcanical AVR in 09/2012 (on Coumadin)  . Sleep apnea    uses CPAP   Past Surgical History:  Procedure Laterality Date  . ABDOMINAL ANGIOGRAM   09/18/2012   Procedure: ABDOMINAL ANGIOGRAM;  Surgeon: Troy Sine, MD;  Location: [facility] CATH LAB;  Service: Cardiovascular;;  . AORTIC VALVE REPLACEMENT  09/29/2012   Procedure: AORTIC VALVE REPLACEMENT (AVR);  Surgeon: Gaye Pollack, MD;  Location: [place];  Service: Open Heart Surgery;  Laterality: N/A;  . ARCH AORTOGRAM  09/18/2012   Procedure: ARCH AORTOGRAM;  Surgeon: Troy Sine, MD;  Location: [facility] CATH LAB;  Service: Cardiovascular;;  . CARDIAC CATHETERIZATION  09/18/12   severe calcific aortic stenosis, peak gradient 54mm, mean gradient 43mm, EF 45%  . CARDIAC VALVE REPLACEMENT     AVR 09-29-12  . CHOLECYSTECTOMY    . FRACTURE SURGERY    . LEFT AND RIGHT HEART CATHETERIZATION WITH CORONARY ANGIOGRAM N/A 09/18/2012   Procedure: LEFT AND RIGHT HEART CATHETERIZATION WITH CORONARY ANGIOGRAM;  Surgeon: Troy Sine, MD;  Location: [facility] CATH LAB;  Service: Cardiovascular;  Laterality: N/A;  . RIGHT HEART CATH AND CORONARY ANGIOGRAPHY N/A 06/29/2019   Procedure: RIGHT HEART CATH AND CORONARY ANGIOGRAPHY;  Surgeon: Nelva Bush, MD;  Location: [facility] CV LAB;  Service: Cardiovascular;  Laterality: N/A;  . TEE WITHOUT CARDIOVERSION N/A 05/29/2019   Procedure: TRANSESOPHAGEAL ECHOCARDIOGRAM (TEE);  Surgeon: Lelon Perla, MD;  Location: [facility] ENDOSCOPY;  Service: Cardiovascular;  Laterality: N/A;    Family History  Problem Relation Age of Onset  . Hypertension Mother   . Diabetes Father   . Heart attack Brother 64  . Hyperlipidemia Brother 65  stents placed    SOCIAL HISTORY: Social History   Socioeconomic History  . Marital status: Married    Spouse name: [name]  . Number of children: 3  . Years of education: 15+  . Highest education level: Not on file  Occupational History    Employer: [employer]  . Financial resource strain: Not on file  . Food insecurity    Worry: Not on file    Inability: Not on file  . Transportation needs    Medical: Not  on file    Non-medical: Not on file  Tobacco Use  . Smoking status: Former Smoker    Packs/day: 0.25    Years: 10.00    Pack years: 2.50    Types: Cigarettes    Quit date: 09/25/1982    Years since quitting: 36.7  . Smokeless tobacco: Former Network engineer and Sexual Activity  . Alcohol use: Yes    Comment: occ. beer/wine  . Drug use: No  . Sexual activity: Yes    Birth control/protection: None  Lifestyle  . Physical activity    Days per week: Not on file    Minutes per session: Not on file  . Stress: Not on file  Relationships  . Social Herbalist on phone: Not on file    Gets together: Not on file    Attends religious service: Not on file    Active member of club or organization: Not on file    Attends meetings of clubs or organizations: Not on file    Relationship status: Not on file  . Intimate partner violence    Fear of current or ex partner: Not on file    Emotionally abused: Not on file    Physically abused: Not on file    Forced sexual activity: Not on file  Other Topics Concern  . Not on file  Social History Narrative   Patient is married [name]) and lives with his wife and son.   Patient has three children.   Patient is working full-time.   Patient has a college education.   Patient is right handed.   Patient drinks about 2-3 cups of coffee daily.    Allergies  Allergen Reactions  . Iohexol Anaphylaxis  . Niacin And Related     Flushing with immediate realese  . Penicillins Other (See Comments)    Unknown.[name]  Did it involve swelling of the face/tongue/throat, SOB, or low BP? Unknown Did it involve sudden or severe rash/hives, skin peeling, or any reaction on the inside of your mouth or nose? Unknown Did you need to seek medical attention at a hospital or doctor's office? Unknown When did it last happen?Childhood If all above answers are "NO", may proceed with cephalosporin use.    Current  Facility-Administered Medications  Medication Dose Route Frequency Provider Last Rate Last Dose  . 0.9 %  sodium chloride infusion   Intravenous Continuous End, Christopher, MD 50 mL/hr at 06/29/19 7829    . 0.9 %  sodium chloride infusion   Intravenous Continuous End, Christopher, MD 50 mL/hr at 06/29/19 1206    . 0.9 %  sodium chloride infusion  250 mL Intravenous PRN End, Harrell Gave, MD      . acetaminophen (TYLENOL) tablet 650 mg  650 mg Oral Q6H PRN End, Christopher, MD   650 mg at 06/27/19 0448   Or  . acetaminophen (TYLENOL) suppository 650 mg  650 mg Rectal Q6H PRN  End, Harrell Gave, MD      . albuterol (PROVENTIL) (2.5 MG/3ML) 0.083% nebulizer solution 2.5 mg  2.5 mg Nebulization Q6H PRN End, Harrell Gave, MD      . cefTRIAXone (ROCEPHIN) 2 g in sodium chloride 0.9 % 100 mL IVPB  2 g Intravenous Q24H End, Christopher, MD 200 mL/hr at 06/29/19 1401 2 g at 06/29/19 1401  . cholecalciferol (VITAMIN D3) tablet 1,000 Units  1,000 Units Oral Daily End, Christopher, MD   1,000 Units at 06/29/19 1212  . digoxin (LANOXIN) tablet 0.125 mg  0.125 mg Oral QHS End, Christopher, MD   0.125 mg at 06/28/19 2027  . escitalopram (LEXAPRO) tablet 10 mg  10 mg Oral QPM End, Christopher, MD   10 mg at 06/29/19 1709  . ezetimibe (ZETIA) tablet 10 mg  10 mg Oral Daily End, Christopher, MD   10 mg at 06/29/19 1213  . feeding supplement (BOOST / RESOURCE BREEZE) liquid 1 Container  1 Container Oral TID BM End, Harrell Gave, MD   1 Container at 06/29/19 1214  . fenofibrate tablet 160 mg  160 mg Oral Daily End, Christopher, MD   160 mg at 06/29/19 1214  . hydrALAZINE (APRESOLINE) injection 10 mg  10 mg Intravenous Q20 Min PRN End, Harrell Gave, MD      . hydrALAZINE (APRESOLINE) tablet 12.5 mg  12.5 mg Oral BID End, Christopher, MD   12.5 mg at 06/29/19 0831  . HYDROmorphone (DILAUDID) injection 1 mg  1 mg Intravenous Q4H PRN End, Harrell Gave, MD   1 mg at 06/29/19 0053  . Icosapent Ethyl CAPS 2 g  2 g Oral BID End,  Christopher, MD   2 g at 06/29/19 1215  . insulin aspart (novoLOG) injection 0-15 Units  0-15 Units Subcutaneous TID WC End, Christopher, MD   15 Units at 06/29/19 1708  . insulin aspart (novoLOG) injection 0-5 Units  0-5 Units Subcutaneous QHS End, Christopher, MD   3 Units at 06/28/19 2231  . insulin aspart (novoLOG) injection 10 Units  10 Units Subcutaneous TID WC End, Christopher, MD   10 Units at 06/29/19 1708  . insulin glargine (LANTUS) injection 30 Units  30 Units Subcutaneous BID End, Christopher, MD   30 Units at 06/29/19 0056  . isosorbide mononitrate (IMDUR) 24 hr tablet 30 mg  30 mg Oral Daily End, Christopher, MD   30 mg at 06/29/19 7619  . loperamide (IMODIUM) capsule 2 mg  2 mg Oral PRN Irene Pap N, DO   2 mg at 06/29/19 1741  . metoprolol succinate (TOPROL-XL) 24 hr tablet 37.5 mg  37.5 mg Oral BID End, Christopher, MD   37.5 mg at 06/29/19 0831  . metroNIDAZOLE (FLAGYL) IVPB 500 mg  500 mg Intravenous Q8H End, Christopher, MD 100 mL/hr at 06/29/19 1454 500 mg at 06/29/19 1454  . mometasone-formoterol (DULERA) 200-5 MCG/ACT inhaler 2 puff  2 puff Inhalation BID End, Harrell Gave, MD   2 puff at 06/28/19 1909  . multivitamin (PROSIGHT) tablet 1 tablet  1 tablet Oral Daily End, Christopher, MD   1 tablet at 06/29/19 1218  . nitroGLYCERIN (NITROSTAT) SL tablet 0.4 mg  0.4 mg Sublingual Q5 min PRN End, Harrell Gave, MD      . nutrition supplement (JUVEN) (JUVEN) powder packet 1 packet  1 packet Oral BID BM End, Harrell Gave, MD   1 packet at 06/29/19 1231  . oxyCODONE (Oxy IR/ROXICODONE) immediate release tablet 5 mg  5 mg Oral Q6H PRN End, Harrell Gave, MD   5 mg at 06/28/19  2230  . saccharomyces boulardii (FLORASTOR) capsule 250 mg  250 mg Oral Q M,W,F End, Christopher, MD   250 mg at 06/29/19 1219  . senna-docusate (Senokot-S) tablet 2 tablet  2 tablet Oral BID End, Harrell Gave, MD   2 tablet at 06/27/19 2039  . sodium chloride flush (NS) 0.9 % injection 3 mL  3 mL Intravenous Q12H  End, Christopher, MD      . sodium chloride flush (NS) 0.9 % injection 3 mL  3 mL Intravenous PRN End, Harrell Gave, MD      . vancomycin (VANCOCIN) IVPB 1000 mg/200 mL premix  1,000 mg Intravenous Q24H End, Christopher, MD 200 mL/hr at 06/29/19 1715 1,000 mg at 06/29/19 1715  . vitamin C (ASCORBIC ACID) tablet 1,000 mg  1,000 mg Oral QPM End, Christopher, MD   1,000 mg at 06/29/19 1709    ROS:   General:  No weight loss, Fever, chills  HEENT: No recent headaches, no nasal bleeding, no visual changes, no sore throat  Neurologic: No dizziness, blackouts, seizures. No recent symptoms of stroke or mini- stroke. No recent episodes of slurred speech, or temporary blindness.  Cardiac: No recent episodes of chest pain/pressure, + shortness of breath at rest.  + shortness of breath with exertion.  + history of atrial fibrillation or irregular heartbeat  Vascular: No history of rest pain in feet.  No history of claudication.  No history of non-healing ulcer, No history of DVT   Pulmonary: No home oxygen, no productive cough, no hemoptysis,  No asthma or wheezing  Musculoskeletal:  [ ]  Arthritis, [ ]  Low back pain,  [ ]  Joint pain  Hematologic:No history of hypercoagulable state.  No history of easy bleeding.  No history of anemia  Gastrointestinal: No hematochezia or melena,  No gastroesophageal reflux, no trouble swallowing  Urinary: [X]  chronic Kidney disease, [ ]  on HD - [ ]  MWF or [ ]  TTHS, [ ]  Burning with urination, [ ]  Frequent urination, [ ]  Difficulty urinating;   Skin: No rashes  Psychological: No history of anxiety,  No history of depression   Physical Examination  Vitals:   06/29/19 1202 06/29/19 1221 06/29/19 1231 06/29/19 1256  BP: (!) 119/52 104/64 (!) 137/112 127/71  Pulse: 68 72 70 (!) 59  Resp:      Temp:      TempSrc:      SpO2:    98%  Weight:      Height:        Body mass index is 35.12 kg/m.  General:  Alert and oriented, no acute distress HEENT: Normal  Neck: No bruit or JVD Pulmonary: Clear to auscultation bilaterally Cardiac: Regular Rate and Rhythm without murmur Abdomen: Soft, non-tender, non-distended, no mass, no scars Skin: No rash, 4 cm necrotic ulcer in the interphalangeal joint plantar aspect left first toe some erythema on the dorsum of the left foot Extremity Pulses:  2+ radial, brachial, femoral, absent popliteal dorsalis pedis, posterior tibial pulses bilaterally Musculoskeletal: No deformity or edema except as mentioned above  Neurologic: Upper and lower extremity motor 5/5 and symmetric  DATA:  CBC    Component Value Date/Time   WBC 10.0 06/30/2019 0551   RBC 4.41 06/30/2019 0551   HGB 11.3 (L) 06/30/2019 0551   HGB 13.8 04/03/2018 0809   HCT 35.7 (L) 06/30/2019 0551   HCT 42.4 04/03/2018 0809   PLT 229 06/30/2019 0551   PLT 168 04/03/2018 0809   MCV 81.0 06/30/2019 0551   MCV 84  04/03/2018 0809   MCH 25.6 (L) 06/30/2019 0551   MCHC 31.7 06/30/2019 0551   RDW 16.9 (H) 06/30/2019 0551   RDW 15.5 (H) 04/03/2018 0809   LYMPHSABS 1.3 06/28/2019 0602   MONOABS 1.4 (H) 06/28/2019 0602   EOSABS 0.4 06/28/2019 0602   BASOSABS 0.1 06/28/2019 0602    BMET    Component Value Date/Time   NA 129 (L) 06/30/2019 0551   NA 139 03/13/2019 1511   K 4.1 06/30/2019 0551   CL 97 (L) 06/30/2019 0551   CO2 22 06/30/2019 0551   GLUCOSE 353 (H) 06/30/2019 0551   BUN 52 (H) 06/30/2019 0551   BUN 42 (H) 03/13/2019 1511   CREATININE 1.37 (H) 06/30/2019 0551   CREATININE 1.06 08/23/2014 0853   CALCIUM 8.5 (L) 06/30/2019 0551   GFRNONAA 52 (L) 06/30/2019 0551   GFRAA >60 06/30/2019 0551   Duplex of the left lower extremity is pending.  ABI performed June 26, 2019 was 0.85 on the left and noncompressible on the right.  Toe pressure on the left was 116.  Right toe pressure was 59    ASSESSMENT: Gangrenous changes left first toe with cellulitis in the foot.  Patient has severe cardiac dysfunction with pending aortic valve and  coronary artery bypass grafting next week.  He does have an element of mild renal dysfunction as well.  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   PLAN: Aortogram bilateral lower extremity runoff possible intervention by my partner Dr. Carlis Abbott on Thursday, July 02, 2019.  Risk benefits possible complications and procedure details were discussed with patient today.  I discussed with the patient that he would most likely need amputation of the left first toe for control of infection.  He should be continued on IV antibiotics (on vancomycin and Flagyl) now to cool down the cellulitis in his left foot.  The patient understands that he is at high risk and may potentially require a below-knee amputation.  He currently would not be a candidate for an open bypass operation due to his severe cardiac dysfunction.   Ruta Hinds, MD [facility] Office: [phone] Pager: [phone]

## 2019-06-29 NOTE — Progress Notes (Signed)
ANTICOAGULATION CONSULT NOTE - Initial Consult  Pharmacy Consult for Heparin Indication: atrial fibrillation  Allergies  Allergen Reactions  . Iohexol Anaphylaxis  . Niacin And Related     Flushing with immediate realese  . Penicillins Other (See Comments)    Unknown.[name]  Did it involve swelling of the face/tongue/throat, SOB, or low BP? Unknown Did it involve sudden or severe rash/hives, skin peeling, or any reaction on the inside of your mouth or nose? Unknown Did you need to seek medical attention at a hospital or doctor's office? Unknown When did it last happen?Childhood If all above answers are "NO", may proceed with cephalosporin use.    Patient Measurements: Height: 5\' 11"  (180.3 cm) Weight: 248 lb 11.2 oz (112.8 kg) IBW/kg (Calculated) : 75.3  Heparin dosing weight: 99.8 kg  Vital Signs: Temp: 98.2 F (36.8 C) (07/26 2224) Temp Source: Oral (07/26 2224) BP: 165/62 (07/26 2224) Pulse Rate: 76 (07/26 2224)  Labs: Recent Labs    06/26/19 1315 06/26/19 1446 06/27/19 0415 06/28/19 0602 06/28/19 1143 06/29/19 0311  HGB 13.0  --  13.2 12.5*  --  12.8*  HCT 41.1  --  43.1 40.6  --  39.9  PLT 240  --  266 237  --  253  LABPROT 37.3*  --  34.0* 26.2* 26.0* 19.6*  INR 3.9*  --  3.4* 2.5* 2.4* 1.7*  HEPARINUNFRC  --   --   --   --   --  0.16*  CREATININE 2.12*  --  2.19* 1.92*  --   --   TROPONINIHS 96* 99*  --   --   --   --     Estimated Creatinine Clearance: 45.7 mL/min (A) (by C-G formula based on SCr of 1.92 mg/dL (H)).   Assessment: 68 yo M presents with CP. He was on warfarin PTA for Afib and mechanical AVR in 09/2012 (last dose 7/23). Pharmacy consulted to dose heparin once INR < 2.5.  Heparin started earlier today with subtherapeutic INR. Initial heparin level is low at 0.16, INR trending down to 1.7, CBC stable.   PTA Warfarin: 10 mg daily except 12.5 mg on Tu/Th/Sa  Goal of Therapy:  Heparin level 0.3-0.7 units/ml   Monitor platelets by anticoagulation protocol: Yes   Plan:  -Increase heparin to 1700 units/hr -Recheck heparin level in [place], PharmD, BCPS Clinical Pharmacist Please check AMION for all [place] numbers 06/29/2019

## 2019-06-29 NOTE — Consult Note (Signed)
Advanced Heart Failure Team Consult Note   Primary Physician: Orpah Melter, MD PCP-Cardiologist:  Shelva Majestic, MD  Reason for Consultation: Heart Failure. Pre-surgical eval  HPI:    Joseph Hoffman is seen today for evaluation of heart failure at the request of Dr Cyndia Bent.   Joseph Hoffman is a 68 year old with history of permanent atrial fibrillation, chronic combined CHF, mechanical AVR in 2013, HTN, HLD, OSA on CPAP, DM type 2, CKD stage 3, and hepatitis.   Over past few months has had a steady downward course with multiple admissions for CP and HF.  He was hospitalized from 05/27/2019-05/30/2019 after presenting with chest pain and SOB. His last echo 04/2019 showed EF 45-50%, with moderate LVH, elevated LA and LVEDP, severe biatrial enlargement, mild-moderate Joseph/MS, and mild AI/moderate AS. His last ischemic evaluation was a NST 05/15/2019 which revealed a fixed defect but no reversible ischemia, but revealed global hypokinesis and EF 31%. Given drop in EF and and recurrent CHF, he was recommended to undergo TEE which showed EF 30-35%, diffuse hypokinesis, severely reduced RV function, severe biatrial enlargement, and severe perivalvular AI. He was transitioned to torsemide at discharge for acute on chronic biventricular combined CHF. He was scheduled for a cardiac catheterization 07/02/2019 with tentative plans for redo AVR with [name] 07/06/2019.   Admitted with on 7/24 with chest pain and chills. He was placed on antibiotic for diabetic foot uler.. Pertinent admission labs included: Covid 19 negative, creatinine 2/12, INR 3.9, WBC 14.9, HS Trop 96>99. CXR was negative for edema.   Currently AF with WBC 12.8. ESR 25. Seen by wound care for ulcer on L great toe. VVS consult pending.   Cath results from today are below. Denies current CP or SOB. Activities very limited.   RHC/LHC  Multivessel coronary disease, 50% mid LAD, 80% large OM2, 50% mid RCA, and 80-90% ostial rPDA stenoses.  RA  (mean): 12 mmHg RV (S/EDP): 55/14 mmHg PA (S/D, mean): 54/25 (34) mmHg PCWP (mean): 25 mmHg PAPi 2.4 Ao sat: 97% PA sat: 62% Fick CO: 5.1 L/min Fick CI: 2.2 L/min/m^2    Review of Systems: [y] = yes, _0  = no   . General: Weight gain [Y ]; Weight loss _1 ; Anorexia _2 ; Fatigue _3 ; Fever _4 ; Chills _5 ; Weakness _6   . Cardiac: Chest pain/pressure [Y]; Resting SOB _7 ; Exertional SOB [Y ]; Orthopnea _8 ; Pedal Edema _9 ; Palpitations _10 ; Syncope _11 ; Presyncope _12 ; Paroxysmal nocturnal dyspnea_13   . Pulmonary: Cough _14 ; Wheezing_15 ; Hemoptysis_16 ; Sputum _17 ; Snoring _18   . GI: Vomiting_19 ; Dysphagia_20 ; Melena_21 ; Hematochezia _22 ; Heartburn_23 ; Abdominal pain _24 ; Constipation _25 ; Diarrhea _26 ; BRBPR _27   . GU: Hematuria_28 ; Dysuria _29 ; Nocturia_30   . Vascular: Pain in legs with walking _31 ; Pain in feet with lying flat _32 ; Non-healing sores _33 ; Stroke _34 ; TIA _35 ; Slurred speech _36 ;  . Neuro: Headaches_37 ; Vertigo_38 ; Seizures_39 ; Paresthesias_40 ;Blurred vision _41 ; Diplopia _42 ; Vision changes _43   . Ortho/Skin: Arthritis _44 ; Joint pain [Y ]; Muscle pain _45 ; Joint swelling _46 ; Back Pain _47 ; Rash _48   . Psych: Depression_49 ; Anxiety_50   . Heme: Bleeding problems _51 ; Clotting disorders _52 ; Anemia _53   . Endocrine: Diabetes [Y ]; Thyroid dysfunction_54   Home  Medications Prior to Admission medications   Medication Sig Start Date End Date Taking? Authorizing Provider  acetaminophen (TYLENOL) 325 MG tablet Take 2 tablets (650 mg total) by mouth every 4 (four) hours as needed for pain or fever. Patient taking differently: Take 650 mg by mouth every 4 (four) hours as needed for fever or headache (pain).  10/16/12  Yes Kilroy, Luke K, PA-C  albuterol (PROVENTIL HFA;VENTOLIN HFA) 108 (90 BASE) MCG/ACT inhaler Inhale 2 puffs into the lungs every 6 (six) hours as needed for wheezing or shortness of breath.    Yes [provider]  Ascorbic Acid (VITAMIN C) 1000 MG  tablet Take 1,000 mg by mouth daily with supper.    Yes [provider]  b complex vitamins tablet Take 1 tablet by mouth daily with breakfast.   Yes [provider]  cholecalciferol (VITAMIN D) 1000 UNITS tablet Take 1,000 Units by mouth daily with breakfast.    Yes [provider]  digoxin (LANOXIN) 0.125 MG tablet TAKE 1 TABLET EVERY DAY Patient taking differently: Take 0.125 mg by mouth at bedtime.  01/21/19  Yes Troy Sine, MD  empagliflozin (JARDIANCE) 25 MG TABS tablet Take 25 mg by mouth daily. Patient taking differently: Take 25 mg by mouth daily with supper.  04/20/19  Yes Troy Sine, MD  escitalopram (LEXAPRO) 10 MG tablet Take 10 mg by mouth at bedtime.    Yes [provider]  ezetimibe (ZETIA) 10 MG tablet Take 1 tablet (10 mg total) by mouth daily. Patient taking differently: Take 10 mg by mouth daily with supper.  02/09/19 07/26/22 Yes Almyra Deforest, PA  fenofibrate 160 MG tablet Take 1 tablet (160 mg total) by mouth daily. Patient taking differently: Take 160 mg by mouth daily with breakfast.  05/16/19  Yes Black, Lezlie Octave, NP  Fluticasone-Salmeterol (ADVAIR) 250-50 MCG/DOSE AEPB Inhale 1 puff into the lungs 2 (two) times daily as needed (shortness of breath/asthma related symptoms).    Yes [provider]  glimepiride (AMARYL) 2 MG tablet Take 2 mg by mouth daily with breakfast.  05/25/17  Yes [provider]  Glucosamine-Chondroit-Vit C-Mn (GLUCOSAMINE 1500 COMPLEX PO) Take 1 tablet by mouth daily with supper.    Yes [provider]  hydrALAZINE (APRESOLINE) 25 MG tablet Take 0.5 tablets (12.5 mg total) by mouth 2 (two) times a day. Patient taking differently: Take 12.5 mg by mouth 2 (two) times daily with a meal.  06/03/19 09/01/19 Yes Troy Sine, MD  Icosapent Ethyl (VASCEPA) 1 g CAPS Take 2 capsules (2 g total) by mouth 2 (two) times daily. Patient taking differently: Take 2 g by mouth 2 (two) times daily with a  meal.  04/20/19  Yes Troy Sine, MD  insulin glargine (LANTUS) 100 UNIT/ML injection Inject 15-40 Units into the skin See admin instructions. Inject 15 - 40 units subcutaneously daily before breakfast (adjusted per CBG); inject 30-40 units at bedtime ( occasionally adjusted per CBG - usually 35 units)   Yes [provider]  isosorbide mononitrate (IMDUR) 30 MG 24 hr tablet Take 1 tablet (30 mg total) by mouth daily. Patient taking differently: Take 30 mg by mouth daily with breakfast.  06/03/19 09/01/19 Yes Troy Sine, MD  liraglutide (VICTOZA) 18 MG/3ML SOPN Inject 1.8 mg into the skin at bedtime.   Yes [provider]  metFORMIN (GLUCOPHAGE) 500 MG tablet Take 2,000 mg by mouth daily with supper.   Yes [provider]  metoprolol succinate (  TOPROL XL) 25 MG 24 hr tablet Take 1.5 tablets (37.5 mg total) by mouth 2 (two) times a day. Patient taking differently: Take 37.5 mg by mouth 2 (two) times daily with a meal.  05/15/19  Yes Black, Lezlie Octave, NP  Multiple Vitamin (MULTIVITAMIN WITH MINERALS) TABS tablet Take 1 tablet by mouth daily with breakfast. [facility]   Yes [provider]  nitroGLYCERIN (NITROSTAT) 0.4 MG SL tablet Place 1 tablet (0.4 mg total) under the tongue every 5 (five) minutes as needed for chest pain. 05/30/19  Yes Guilford Shi, MD  saccharomyces boulardii (FLORASTOR) 250 MG capsule Take 250 mg by mouth daily as needed (constipation).    Yes [provider]  torsemide (DEMADEX) 20 MG tablet Take 1 tablet (20 mg total) by mouth 2 (two) times daily. Patient taking differently: Take 20 mg by mouth See admin instructions. Take one tablet (20 mg) by mouth twice daily - morning and mid afternoon 05/30/19  Yes Guilford Shi, MD  warfarin (COUMADIN) 10 MG tablet Take 10 mg by mouth at bedtime. On Tuesday, Thursday, Saturday add 1/2 5 mg tablet (2.5 mg) for a total dose of 12.5 mg   Yes [provider]  warfarin  (COUMADIN) 5 MG tablet Take as instructed with 10 mg. Patient taking differently: Take 2.5 mg by mouth See admin instructions. Take 1/2 tablet (2.81m) with 10 mg tablet for a  total dose of 12.5 on Tues, Thurs and Saturday nights 12/11/18  Yes KTroy Sine MD  B-D ULTRAFINE III SHORT PEN 31G X 8 MM MISC  10/04/13   [provider]  EASY TOUCH LANCETS 30G/TWIST [place] 11/05/13   [provider]    Past Medical History: Past Medical History:  Diagnosis Date  . Anxiety   . Aortic stenosis   . Arthritis   . Asthma   . Asymptomatic LV dysfunction, EF 45%, normal coronary arteries on cardiac cath 09/18/12 09/30/2012  . CHF (congestive heart failure) ([facility]   . Chronic anticoagulation, on Xarelto prior to admit 09/30/2012  . DM (diabetes mellitus) [place] 09/30/2012  . Hepatitis 2003  . Hypertension   . [place]ocardial infarction [place]   . Permanent atrial fibrillation, since 1994 09/30/2012   stress test 02/08/12- normal study, no significant ischemia  . S/P AVR (aortic valve replacement), 09/30/2012   a. s/p mechcanical AVR in 09/2012 (on Coumadin)  . Sleep apnea    uses CPAP    Past Surgical History: Past Surgical History:  Procedure Laterality Date  . ABDOMINAL ANGIOGRAM  09/18/2012   Procedure: ABDOMINAL ANGIOGRAM;  Surgeon: TTroy Sine MD;  [facility];  Service: Cardiovascular;;  . AORTIC VALVE REPLACEMENT  09/29/2012   Procedure: AORTIC VALVE REPLACEMENT (AVR);  Surgeon: BGaye Pollack MD;  [place]  Service: Open Heart Surgery;  Laterality: N/A;  . ARCH AORTOGRAM  09/18/2012   Procedure: ARCH AORTOGRAM;  Surgeon: TTroy Sine MD;  [facility];  Service: Cardiovascular;;  . CARDIAC CATHETERIZATION  09/18/12   severe calcific aortic stenosis, peak gradient 660m mean gradient 5080mEF 45%  . CARDIAC VALVE REPLACEMENT     AVR 09-29-12  . CHOLECYSTECTOMY    . FRACTURE SURGERY    . LEFT AND RIGHT HEART CATHETERIZATION WITH CORONARY  ANGIOGRAM N/A 09/18/2012   Procedure: LEFT AND RIGHT HEART CATHETERIZATION WITH CORONARY ANGIOGRAM;  Surgeon: ThoTroy SineD;  [facility];  Service: Cardiovascular;  Laterality: N/A;  . RIGHT  HEART CATH AND CORONARY ANGIOGRAPHY N/A 06/29/2019   Procedure: RIGHT HEART CATH AND CORONARY ANGIOGRAPHY;  Surgeon: Nelva Bush, MD;  Location: [facility];  Service: Cardiovascular;  Laterality: N/A;  . TEE WITHOUT CARDIOVERSION N/A 05/29/2019   Procedure: TRANSESOPHAGEAL ECHOCARDIOGRAM (TEE);  Surgeon: Lelon Perla, MD;  Location[facility] ENDOSCOPY;  Service: Cardiovascular;  Laterality: N/A;    Family History: Family History  Problem Relation Age of Onset  . Hypertension Mother   . Diabetes Father   . Heart attack Brother 44  . Hyperlipidemia Brother 26       stents placed    Social History: Social History   Socioeconomic History  . Marital status: Married    Spouse name: [name]  . Number of children: 3  . Years of education: 15+  . Highest education level: Not on file  Occupational History    Employer: [employer]  . Financial resource strain: Not on file  . Food insecurity    Worry: Not on file    Inability: Not on file  . Transportation needs    Medical: Not on file    Non-medical: Not on file  Tobacco Use  . Smoking status: Former Smoker    Packs/day: 0.25    Years: 10.00    Pack years: 2.50    Types: Cigarettes    Quit date: 09/25/1982    Years since quitting: 36.7  . Smokeless tobacco: Former Network engineer and Sexual Activity  . Alcohol use: Yes    Comment: occ. beer/wine  . Drug use: No  . Sexual activity: Yes    Birth control/protection: None  Lifestyle  . Physical activity    Days per week: Not on file    Minutes per session: Not on file  . Stress: Not on file  Relationships  . Social Herbalist on phone: Not on file    Gets together: Not on file    Attends religious service: Not on file    Active member  of club or organization: Not on file    Attends meetings of clubs or organizations: Not on file    Relationship status: Not on file  Other Topics Concern  . Not on file  Social History Narrative   Patient is married [name]) and lives with his wife and son.   Patient has three children.   Patient is working full-time.   Patient has a college education.   Patient is right handed.   Patient drinks about 2-3 cups of coffee daily.    Allergies:  Allergies  Allergen Reactions  . Iohexol Anaphylaxis  . Niacin And Related     Flushing with immediate realese  . Penicillins Other (See Comments)    Unknown.[name]  Did it involve swelling of the face/tongue/throat, SOB, or low BP? Unknown Did it involve sudden or severe rash/hives, skin peeling, or any reaction on the inside of your mouth or nose? Unknown Did you need to seek medical attention at a hospital or doctor's office? Unknown When did it last happen?Childhood If all above answers are "NO", may proceed with cephalosporin use.    Objective:    Vital Signs:   Temp:  [97.5 F (36.4 C)-98.2 F (36.8 C)] 97.6 F (36.4 C) (07/27 0818) Pulse Rate:  [0-123] 59 (07/27 1256) Resp:  [0-53] 18 (07/27 1119) BP: (104-165)/(52-112) 127/71 (07/27 1256) SpO2:  [0 %-98 %] 98 % (07/27 1256) Weight:  [114.2 kg] 114.2  kg (07/27 0610) Last BM Date: 06/29/19  Weight change: Filed Weights   06/27/19 0454 06/28/19 0609 06/29/19 0610  Weight: 112.1 kg 112.8 kg 114.2 kg    Intake/Output:   Intake/Output Summary (Last 24 hours) at 06/29/2019 1603 Last data filed at 06/29/2019 1448 Gross per 24 hour  Intake 1212.85 ml  Output 2115 ml  Net -902.15 ml      Physical Exam    See MD addendum  Telemetry   AF 50-60s Personally reviewed   EKG    AF with IVCD inferolateral Qs No acute ST-T abnormalities. Personally reviewed   Labs   Basic Metabolic Panel: Recent Labs  Lab 06/26/19 1315 06/27/19 0415  06/28/19 0602 06/29/19 0311  NA 134* 135 134* 133*  K 4.5 3.9 4.2 3.6  CL 98 96* 97* 97*  CO2 21* 25 24 21*  GLUCOSE 227* 170* 140* 98  BUN 46* 45* 45* 57*  CREATININE 2.12* 2.19* 1.92* 1.96*  CALCIUM 9.7 9.2 9.0 9.1    Liver Function Tests: No results for input(s): AST, ALT, ALKPHOS, BILITOT, PROT, ALBUMIN in the last 168 hours. No results for input(s): LIPASE, AMYLASE in the last 168 hours. No results for input(s): AMMONIA in the last 168 hours.  CBC: Recent Labs  Lab 06/26/19 1315 06/27/19 0415 06/28/19 0602 06/29/19 0311  WBC 14.9* 12.4* 11.1* 12.2*  NEUTROABS  --   --  7.8*  --   HGB 13.0 13.2 12.5* 12.8*  HCT 41.1 43.1 40.6 39.9  MCV 81.9 83.9 83.7 81.3  PLT 240 266 237 253    Cardiac Enzymes: No results for input(s): CKTOTAL, CKMB, CKMBINDEX, TROPONINI in the last 168 hours.  BNP: BNP (last 3 results) Recent Labs    05/12/19 1426 05/28/19 0617 06/26/19 1315  BNP 297.0* 342.8* 669.2*    ProBNP (last 3 results) No results for input(s): PROBNP in the last 8760 hours.   CBG: Recent Labs  Lab 06/28/19 1131 06/28/19 1639 06/28/19 2115 06/29/19 0610 06/29/19 1151  GLUCAP 341* 481* 290* 97 127*    Coagulation Studies: Recent Labs    06/27/19 0415 06/28/19 0602 06/28/19 1143 06/29/19 0311  LABPROT 34.0* 26.2* 26.0* 19.6*  INR 3.4* 2.5* 2.4* 1.7*     Imaging    No results found.   Medications:     Current Medications: . cholecalciferol  1,000 Units Oral Daily  . digoxin  0.125 mg Oral QHS  . escitalopram  10 mg Oral QPM  . ezetimibe  10 mg Oral Daily  . feeding supplement  1 Container Oral TID BM  . fenofibrate  160 mg Oral Daily  . hydrALAZINE  12.5 mg Oral BID  . Icosapent Ethyl  2 g Oral BID  . insulin aspart  0-15 Units Subcutaneous TID WC  . insulin aspart  0-5 Units Subcutaneous QHS  . insulin aspart  10 Units Subcutaneous TID WC  . insulin glargine  30 Units Subcutaneous BID  . isosorbide mononitrate  30 mg Oral Daily   . metoprolol succinate  37.5 mg Oral BID  . mometasone-formoterol  2 puff Inhalation BID  . multivitamin  1 tablet Oral Daily  . nutrition supplement (JUVEN)  1 packet Oral BID BM  . saccharomyces boulardii  250 mg Oral Q M,W,F  . senna-docusate  2 tablet Oral BID  . sodium chloride flush  3 mL Intravenous Q12H  . vitamin C  1,000 mg Oral QPM     Infusions: . sodium chloride 50 mL/hr at 06/29/19 0814  .  sodium chloride 50 mL/hr at 06/29/19 1206  . sodium chloride    . cefTRIAXone (ROCEPHIN)  IV 2 g (06/29/19 1401)  . metronidazole 500 mg (06/29/19 1454)  . vancomycin Stopped (06/28/19 1757)       Assessment/Plan   1. Chest Pain, CAD LHC 06/29/19: Multivessel coronary disease, 50% mid LAD, 80% large OM2, 50% mid RCA, and 80-90% ostial rPDA stenoses.   2. Severe Aortic  H/O Mechanical AVR 2014 [facility]  Dr Cyndia Bent planning on redo AVR   3. Chronic Biventricular Heart Failure  ECHO 05/2019 EF 30-35%   4. Diabetic Foot Ulcer WBC 14.9>12  On antibiotics.   5. Chronic A fib  Rate controlled on Toprol XL.   6. OSA Needs CPAP every night.   7. Diabetes  8. CKD Stage IV  Creatinine baseline ~2.    Length of Stay: 3  Amy Clegg, NP  06/29/2019, 4:03 PM  Advanced Heart Failure Team Pager [phone] (M-F; 7a - 4p)  Please contact [facility] for night-coverage after hours (4p -7a ) and weekends on [email or web address]  .Patient seen and examined with Darrick Grinder, NP. We discussed all aspects of the encounter. I agree with the assessment and plan as stated above.   Very difficult situation.  68 yo male as above DM2, obesity, OSA, CKD 4, chronic AF and h/o mechanical AVR with several month course of progressive HF an debility. Last admit found to have drop in EF to 30-35% with severe RV dysfunction in setting of severe perivalvular AI. Now readmitted with CP and cath today showed diffuse diabetic CAD which is new from last cath. RHC with elevated filling pressures and preserved  output.  Course also notable for non healing ulcer on left great toe. HE is tentatively scheduled for re-do AVR +/- CABG nest week with [name].   On exam obese male NAD HEENT: normal JVP to 8 + carotid bruits Cor III mechanical s2. 26 AI/AS Lungs clear no wheeze Ab obese NT Ext 1-2+ edema. Warm. Ulcer on left great toe dressed Neuro: alert & oriented x 3, cranial nerves grossly intact. moves all 4 extremities w/o difficulty. Affect pleasant  He clearly needs re-do AVR but has several major issues including:  1) understanding the etiology of AI (? Endocarditis). However [place] from 7/24b negative. ESR low and TEE without obvious vegetation.   2) severe RV dysfunction   3) advanced CKD  4) an active wound on his R foot.   Thus at this time I do not think he is a candidate for surgery. Will await VVS input on his wound tomorrow and discuss with TCTS.   Once he is ready for surgery will likely need milrinone for pre-op RV support but I think we have several issues to clear up first. Once kidneys stable post-op will begin gentle diuresis.   Glori Bickers, MD  9:15 PM

## 2019-06-29 NOTE — Progress Notes (Signed)
Progress Note  Patient Name: Joseph Hoffman Date of Encounter: 06/29/2019  Primary Cardiologist: Claiborne Billings  Subjective   Back from cath lab  Inpatient Medications    Scheduled Meds: . cholecalciferol  1,000 Units Oral Daily  . digoxin  0.125 mg Oral QHS  . escitalopram  10 mg Oral QPM  . ezetimibe  10 mg Oral Daily  . feeding supplement  1 Container Oral TID BM  . fenofibrate  160 mg Oral Daily  . hydrALAZINE  12.5 mg Oral BID  . Icosapent Ethyl  2 g Oral BID  . insulin aspart  0-15 Units Subcutaneous TID WC  . insulin aspart  0-5 Units Subcutaneous QHS  . insulin aspart  10 Units Subcutaneous TID WC  . insulin glargine  30 Units Subcutaneous BID  . isosorbide mononitrate  30 mg Oral Daily  . metoprolol succinate  37.5 mg Oral BID  . mometasone-formoterol  2 puff Inhalation BID  . multivitamin  1 tablet Oral Daily  . nutrition supplement (JUVEN)  1 packet Oral BID BM  . saccharomyces boulardii  250 mg Oral Q M,W,F  . senna-docusate  2 tablet Oral BID  . sodium chloride flush  3 mL Intravenous Q12H  . vitamin C  1,000 mg Oral QPM   Continuous Infusions: . sodium chloride 50 mL/hr at 06/29/19 0814  . sodium chloride 50 mL/hr at 06/29/19 1206  . sodium chloride    . cefTRIAXone (ROCEPHIN)  IV Stopped (06/28/19 1552)  . metronidazole 500 mg (06/29/19 0514)  . vancomycin Stopped (06/28/19 1757)   PRN Meds: sodium chloride, acetaminophen **OR** acetaminophen, albuterol, hydrALAZINE, HYDROmorphone (DILAUDID) injection, nitroGLYCERIN, oxyCODONE, sodium chloride flush   Vital Signs    Vitals:   06/29/19 1202 06/29/19 1221 06/29/19 1231 06/29/19 1256  BP: (!) 119/52 104/64 (!) 137/112 127/71  Pulse: 68 72 70 (!) 59  Resp:      Temp:      TempSrc:      SpO2:    98%  Weight:      Height:        Intake/Output Summary (Last 24 hours) at 06/29/2019 1333 Last data filed at 06/29/2019 0856 Gross per 24 hour  Intake 1212.85 ml  Output 1790 ml  Net -577.15 ml    I/O  since admission:   Filed Weights   06/27/19 0454 06/28/19 0609 06/29/19 0610  Weight: 112.1 kg 112.8 kg 114.2 kg    Telemetry    AF - Personally Reviewed  ECG    ECG (independently read by me): Atrial Fibrillationat 80; RBBB PVC vs aberrant complex  Physical Exam   BP 127/71   Pulse (!) 59   Temp 97.6 F (36.4 C) (Oral)   Resp 18   Ht 5\' 11"  (1.803 m)   Wt 114.2 kg   SpO2 98%   BMI 35.12 kg/m  General: Alert, oriented, no distress.  Skin: normal turgor, no rashes, warm and dry HEENT: Normocephalic, atraumatic. Pupils equal round and reactive to light; sclera anicteric; extraocular muscles intact;  Nose without nasal septal hypertrophy Mouth/Parynx benign; Mallinpatti scale 3 Neck: No JVD, no carotid bruits; normal carotid upstroke Lungs: clear to ausculatation and percussion; no wheezing or rales Chest wall: without tenderness to palpitation Heart: PMI not displaced, irregular irregular, s1 s2 normal, 1/6 systolic murmur, no diastolic murmur, no rubs, gallops, thrills, or heaves Abdomen: soft, nontender; no hepatosplenomehaly, BS+; abdominal aorta nontender and not dilated by palpation. Back: no CVA tenderness Pulses 2+ Musculoskeletal: full range  of motion, normal strength, no joint deformities Extremities: no clubbing cyanosis or edema, Homan's sign negative  Neurologic: grossly nonfocal; Cranial nerves grossly wnl Psychologic: Normal mood and affect   Labs    Chemistry Recent Labs  Lab 06/27/19 0415 06/28/19 0602 06/29/19 0311  NA 135 134* 133*  K 3.9 4.2 3.6  CL 96* 97* 97*  CO2 25 24 21*  GLUCOSE 170* 140* 98  BUN 45* 45* 57*  CREATININE 2.19* 1.92* 1.96*  CALCIUM 9.2 9.0 9.1  GFRNONAA 29* 35* 34*  GFRAA 34* 40* 39*  ANIONGAP 14 13 15      Hematology Recent Labs  Lab 06/27/19 0415 06/28/19 0602 06/29/19 0311  WBC 12.4* 11.1* 12.2*  RBC 5.14 4.85 4.91  HGB 13.2 12.5* 12.8*  HCT 43.1 40.6 39.9  MCV 83.9 83.7 81.3  MCH 25.7* 25.8* 26.1   MCHC 30.6 30.8 32.1  RDW 17.2* 17.3* 17.2*  PLT 266 237 253    Cardiac EnzymesNo results for input(s): TROPONINI in the last 168 hours. No results for input(s): TROPIPOC in the last 168 hours.   BNP Recent Labs  Lab 06/26/19 1315  BNP 669.2*     DDimer No results for input(s): DDIMER in the last 168 hours.   Lipid Panel     Component Value Date/Time   CHOL 111 05/13/2019 0422   CHOL 156 02/05/2019 1006   CHOL 166 08/23/2014 0853   TRIG 89 05/13/2019 0422   TRIG 274 (H) 08/23/2014 0853   HDL 19 (L) 05/13/2019 0422   HDL 23 (L) 02/05/2019 1006   HDL 26 (L) 08/23/2014 0853   CHOLHDL 5.8 05/13/2019 0422   VLDL 18 05/13/2019 0422   LDLCALC 74 05/13/2019 0422   LDLCALC 84 02/05/2019 1006   LDLCALC 85 08/23/2014 0853     Radiology    No results found.  Cardiac Studies   Conclusions: 1. Multivessel coronary artery disease, including 50% mid LAD, 80% large OM2, 50% mid RCA, and 80-90% ostial rPDA stenoses. 2. Mildly to moderately elevated left and right heart filling pressures. 3. Mildly reduced Fick cardiac output/index.  Recommendations: 1. Gentle post-cath hydration for goal net even fluid balance today.  Consider gentle diuresis, as renal function tolerates, beginning tomorrow. 2. Ongoing workup for redo aortic valve replacement +/- CABG per Drs. Claiborne Billings and [place]. 3. Initiate heparin infusion 2 hours after TR band removal. 4. Aggressive secondary prevention.   Right Heart Pressures RA (mean): 12 mmHg RV (S/EDP): 55/14 mmHg PA (S/D, mean): 54/25 (34) mmHg PCWP (mean): 25 mmHg  Ao sat: 97% PA sat: 62%  Fick CO: 5.1 L/min Fick CI: 2.2 L/min/m^2    Intervention    Patient Profile     Joseph Andujo Gubelliis a 68 y.o.malewith a PMH of permanent atrial fibrillation, chronic combined CHF, [employer] Jude AVR in 2013, HTN, HLD, OSA on CPAP, DM type 2, CKD stage 3 who has recently was found to have a decline in LV fxn with significant AVR perivalvular leak.  Assessment  & Plan    1. Acute on chronic biventricular combined CHF: Most recent TEE in June 2020 showed an EF that had declined to 30 to 35% from May 2019 when it was 50 to 55% and 40 to 45% in May 2020.  [name] contributed by development of severe perivalvular aortic insufficiency.  He has responded to torsemide in addition to initiation of outpatient hydralazine Imdur metoprolol succinate and has been on longstanding low-dose digoxin.  During admission he has been given Lasix  80 mg twice daily x2 doses.  BNP on admission 669.  2.  History of [employer] AVR now with severe aortic insufficiency.  Patient had seen Dr. Mohammed Kindle in follow-up of my telemedicine evaluation and tentatively is scheduled to undergo aortic valve replacement on July 06, 2019.  We will need to make certain he is stable from a heart failure perspective and renal function status prior to undergoing his surgery.  In light of the catheterization findings today he most likely will also require concomitant CABG revascularization.  3.  CAD: Previous catheterization prior to his [facility] Jude aortic valve replacement had shown normal coronary arteries.  Catheterization performed today demonstrates tiny vessel CAD with the tightest stenoses being in the circumflex marginal vessel and ostium of the PDA.   CABG surgery will be necessary at the time of his aortic valve replacement.  4.  Chronic kidney disease; stage IIIb and stage IV: Today 1.96 prior to the catheterization laboratory improved from 2.19.  Will need gentle hydration and close follow-up.  5.  Permanent atrial fibrillation: Rate control.  Coumadin is currently on hold due to his catheterization.  Will need to heparinize post procedure to continue anticoagulation prior to upcoming surgery.  6.  OSA on CPAP therapy: Has benefited in prior recent admissions with supplemental O2 nocturnally  7.  Mixed hyperlipidemia:  intolerant to statins, currently on the Cipro, Zetia, and fenofibrate.  8.   Type 2 diabetes mellitus: The past had benefited from the addition of Jardiance, currently not on with renal insufficiency  Signed, Troy Sine, MD, [facility] 06/29/2019, 1:33 PM

## 2019-06-29 NOTE — Consult Note (Signed)
[place] Nurse wound consult note Reason for Consult: left great toe wound Patient reports present time two weeks.  Had an appointment with podiatry in [place] that also is "specialist in diabetes"  Wound type: arterial in the presence of DM Pressure Injury POA: NA Measurement:circumfrencial areas of maceration, hyperkeratosis, and medial aspect is necrotic aprox. 25% of the toe is soft and black  Wound bed: see above  Drainage (amount, consistency, odor) serosanguinous  Periwound: see above; weak distal pulses; erythema that extends from the toe to the dorsal and medial foot.   Dressing procedure/placement/frequency: With abnormal ABI I will be very conservative with topical therapy. Use silver hydrofiber around the toe to dry area of moisture/exudate  Paint ulceration with betadine for now to dry area.   Would consider vascular consultation for the ulceration and follow up.   Discussed POC with patient and bedside nurse.  Re consult if needed, will not follow at this time. Thanks  Flordia Kassem [employer], RN,CWOCN, CNS, [place] [phone])

## 2019-06-29 NOTE — Progress Notes (Signed)
[place] NOTE  Pharmacy Consult for Heparin + Vancomycin Indication: atrial fibrillation/mechanical AVR + Diabetic foot infection  Allergies  Allergen Reactions  . Iohexol Anaphylaxis  . Niacin And Related     Flushing with immediate realese  . Penicillins Other (See Comments)    Unknown.[name]  Did it involve swelling of the face/tongue/throat, SOB, or low BP? Unknown Did it involve sudden or severe rash/hives, skin peeling, or any reaction on the inside of your mouth or nose? Unknown Did you need to seek medical attention at a hospital or doctor's office? Unknown When did it last happen?Childhood If all above answers are "NO", may proceed with cephalosporin use.    Patient Measurements: Height: 5\' 11"  (180.3 cm) Weight: 251 lb 12.8 oz (114.2 kg) IBW/kg (Calculated) : 75.3  Heparin dosing weight: 100 kg  Vital Signs: Temp: 97.6 F (36.4 C) (07/27 0818) Temp Source: Oral (07/27 0818) BP: 104/64 (07/27 1221) Pulse Rate: 72 (07/27 1221)  Labs: Recent Labs    06/26/19 1315 06/26/19 1446 06/27/19 0415 06/28/19 0602 06/28/19 1143 06/29/19 0311  HGB 13.0  --  13.2 12.5*  --  12.8*  HCT 41.1  --  43.1 40.6  --  39.9  PLT 240  --  266 237  --  253  LABPROT 37.3*  --  34.0* 26.2* 26.0* 19.6*  INR 3.9*  --  3.4* 2.5* 2.4* 1.7*  HEPARINUNFRC  --   --   --   --   --  0.16*  CREATININE 2.12*  --  2.19* 1.92*  --  1.96*  TROPONINIHS 96* 99*  --   --   --   --     Estimated Creatinine Clearance: 45.1 mL/min (A) (by C-G formula based on SCr of 1.96 mg/dL (H)).   Assessment: 6 YOM with history of Afib and mechanical AVR on Coumadin PTA.  Patient is s/p cath today 06/29/19 and may need redo AVR +/- CABG.  Pharmacy consulted to resume IV heparin 2 hours post TR band removal (consult stated deflation, but confirmed with MD).  No bleeding nor hematoma per RN.  Pharmacy consulted to dose vancomycin for diabetic foot infection.   He is also on Rocephin and Flagyl.  Renal function is stable.  Afebrile, WBC up to 12.2.  Vanc 7/24 >> CTX 7/24 >> Flagyl IV 7/24 >>  7/24 BCx - NGTD 7/24 COVID - negative  Goal of Therapy:  Heparin level 0.3-0.7 units/ml  Monitor platelets by anticoagulation protocol: Yes  Vanc AUC 400-550   Plan:  Two 2 hrs post TR band removal, restart heparin gtt at 1700 units/hr Check 8 hr heparin level Daily heparin level and CBC Monitor s/sx of bleeding/hematoma  Continue vanc 1gm IV Q24H for AUC 534 using SCr 1.92 CTX 2gm IV Q24H and Flagyl 500mg  IV Q8H per MD Monitor renal fxn, clinical progress, vanc AUC  Zadia Uhde D. Mina Marble, PharmD, BCPS, [place] 06/29/2019, 1:52 PM

## 2019-06-29 NOTE — Progress Notes (Signed)
Checked on patient machine's water level, placed machine at bedside so patient could place himself on when he was ready to go to bed.  No distress noted, continue to monitor.

## 2019-06-30 ENCOUNTER — Inpatient Hospital Stay (HOSPITAL_COMMUNITY): Payer: Medicare Other

## 2019-06-30 DIAGNOSIS — L0390 Cellulitis, unspecified: Secondary | ICD-10-CM

## 2019-06-30 DIAGNOSIS — Z91041 Radiographic dye allergy status: Secondary | ICD-10-CM

## 2019-06-30 DIAGNOSIS — I4891 Unspecified atrial fibrillation: Secondary | ICD-10-CM

## 2019-06-30 DIAGNOSIS — I739 Peripheral vascular disease, unspecified: Secondary | ICD-10-CM

## 2019-06-30 DIAGNOSIS — Z01810 Encounter for preprocedural cardiovascular examination: Secondary | ICD-10-CM

## 2019-06-30 DIAGNOSIS — L03032 Cellulitis of left toe: Secondary | ICD-10-CM

## 2019-06-30 DIAGNOSIS — Z9989 Dependence on other enabling machines and devices: Secondary | ICD-10-CM

## 2019-06-30 DIAGNOSIS — I130 Hypertensive heart and chronic kidney disease with heart failure and stage 1 through stage 4 chronic kidney disease, or unspecified chronic kidney disease: Secondary | ICD-10-CM

## 2019-06-30 DIAGNOSIS — I96 Gangrene, not elsewhere classified: Secondary | ICD-10-CM

## 2019-06-30 DIAGNOSIS — E1122 Type 2 diabetes mellitus with diabetic chronic kidney disease: Secondary | ICD-10-CM

## 2019-06-30 DIAGNOSIS — E11621 Type 2 diabetes mellitus with foot ulcer: Secondary | ICD-10-CM

## 2019-06-30 DIAGNOSIS — Z8619 Personal history of other infectious and parasitic diseases: Secondary | ICD-10-CM

## 2019-06-30 DIAGNOSIS — E1152 Type 2 diabetes mellitus with diabetic peripheral angiopathy with gangrene: Secondary | ICD-10-CM

## 2019-06-30 DIAGNOSIS — Z87891 Personal history of nicotine dependence: Secondary | ICD-10-CM

## 2019-06-30 DIAGNOSIS — G4733 Obstructive sleep apnea (adult) (pediatric): Secondary | ICD-10-CM

## 2019-06-30 DIAGNOSIS — Z886 Allergy status to analgesic agent status: Secondary | ICD-10-CM

## 2019-06-30 DIAGNOSIS — Z880 Allergy status to penicillin: Secondary | ICD-10-CM

## 2019-06-30 LAB — BASIC METABOLIC PANEL
Anion gap: 10 (ref 5–15)
BUN: 52 mg/dL — ABNORMAL HIGH (ref 8–23)
CO2: 22 mmol/L (ref 22–32)
Calcium: 8.5 mg/dL — ABNORMAL LOW (ref 8.9–10.3)
Chloride: 97 mmol/L — ABNORMAL LOW (ref 98–111)
Creatinine, Ser: 1.37 mg/dL — ABNORMAL HIGH (ref 0.61–1.24)
GFR calc Af Amer: >60 mL/min (ref >60)
GFR calc non Af Amer: 52 mL/min — ABNORMAL LOW (ref >60)
Glucose, Bld: 353 mg/dL — ABNORMAL HIGH (ref 70–99)
Potassium: 4.1 mmol/L (ref 3.5–5.1)
Sodium: 129 mmol/L — ABNORMAL LOW (ref 135–145)

## 2019-06-30 LAB — GLUCOSE, CAPILLARY
Glucose-Capillary: 313 mg/dL — ABNORMAL HIGH (ref 70–99)
Glucose-Capillary: 320 mg/dL — ABNORMAL HIGH (ref 70–99)
Glucose-Capillary: 363 mg/dL — ABNORMAL HIGH (ref 70–99)
Glucose-Capillary: 242 mg/dL — ABNORMAL HIGH (ref 70–99)

## 2019-06-30 LAB — CBC
HCT: 35.7 % — ABNORMAL LOW (ref 39.0–52.0)
Hemoglobin: 11.3 g/dL — ABNORMAL LOW (ref 13.0–17.0)
MCH: 25.6 pg — ABNORMAL LOW (ref 26.0–34.0)
MCHC: 31.7 g/dL (ref 30.0–36.0)
MCV: 81 fL (ref 80.0–100.0)
Platelets: 229 10*3/uL (ref 150–400)
RBC: 4.41 MIL/uL (ref 4.22–5.81)
RDW: 16.9 % — ABNORMAL HIGH (ref 11.5–15.5)
WBC: 10 10*3/uL (ref 4.0–10.5)
nRBC: 0 % (ref 0.0–0.2)

## 2019-06-30 LAB — HEPARIN LEVEL (UNFRACTIONATED): Heparin Unfractionated: 0.36 IU/mL (ref 0.30–0.70)

## 2019-06-30 LAB — PROTIME-INR
INR: 1.4 — ABNORMAL HIGH (ref 0.8–1.2)
Prothrombin Time: 17.2 seconds — ABNORMAL HIGH (ref 11.4–15.2)

## 2019-06-30 MED ORDER — FUROSEMIDE 10 MG/ML IJ SOLN
80.0000 mg | Freq: Two times a day (BID) | INTRAMUSCULAR | Status: DC
  Administered 2019-06-30 – 2019-07-01 (×2): 80 mg via INTRAVENOUS
  Filled 2019-06-30 (×2): qty 8

## 2019-06-30 MED ORDER — GUAIFENESIN-DM 100-10 MG/5ML PO SYRP
5.0000 mL | ORAL_SOLUTION | ORAL | Status: DC | PRN
  Filled 2019-06-30: qty 5

## 2019-06-30 MED FILL — Midazolam HCl Inj 2 MG/2ML (Base Equivalent): INTRAMUSCULAR | Qty: 2 | Status: AC

## 2019-06-30 MED FILL — Fentanyl Citrate Preservative Free (PF) Inj 100 MCG/2ML: INTRAMUSCULAR | Qty: 2 | Status: AC

## 2019-06-30 NOTE — Progress Notes (Signed)
PT Cancellation Note  Patient Details Name: Joseph Hoffman MRN: [id number] DOB: 05-20-1951   Cancelled Treatment:    Reason Eval/Treat Not Completed: Patient at procedure or test/unavailable(Doppler and ultrasound)   Ugo Thoma B Russell Quinney 06/30/2019, 10:05 AM  Elwyn Reach, PT Acute Rehabilitation Services Pager: [phone] Office: [phone]

## 2019-06-30 NOTE — Progress Notes (Signed)
[place] NOTE  Pharmacy Consult for Heparin + Vancomycin Indication: atrial fibrillation/mechanical AVR + Diabetic foot infection  Allergies  Allergen Reactions  . Iohexol Anaphylaxis  . Niacin And Related     Flushing with immediate realese  . Penicillins Other (See Comments)    Unknown.[name]  Did it involve swelling of the face/tongue/throat, SOB, or low BP? Unknown Did it involve sudden or severe rash/hives, skin peeling, or any reaction on the inside of your mouth or nose? Unknown Did you need to seek medical attention at a hospital or doctor's office? Unknown When did it last happen?Childhood If all above answers are "NO", may proceed with cephalosporin use.    Patient Measurements: Height: 5\' 11"  (180.3 cm) Weight: 255 lb 8 oz (115.9 kg) IBW/kg (Calculated) : 75.3  Heparin dosing weight: 100 kg  Vital Signs: Temp: 97.6 F (36.4 C) (07/28 0425) Temp Source: Oral (07/28 0425) BP: 158/62 (07/28 0425) Pulse Rate: 62 (07/28 0425)  Labs: Recent Labs    06/28/19 0602 06/28/19 1143 06/29/19 0311 06/29/19 1044 06/29/19 1050 06/30/19 0551  HGB 12.5*  --  12.8* 12.6* 12.2* 11.3*  HCT 40.6  --  39.9 37.0* 36.0* 35.7*  PLT 237  --  253  --   --  229  LABPROT 26.2* 26.0* 19.6*  --   --  17.2*  INR 2.5* 2.4* 1.7*  --   --  1.4*  HEPARINUNFRC  --   --  0.16*  --   --  0.36  CREATININE 1.92*  --  1.96*  --   --  1.37*    Estimated Creatinine Clearance: 64.9 mL/min (A) (by C-G formula based on SCr of 1.37 mg/dL (H)).   Assessment: 23 YOM with history of Afib and mechanical AVR on Coumadin PTA.  Patient is s/p cath 06/29/19 and may need redo AVR +/- CABG.    Heparin level after restart came back therapeutic at 0.36, on 1700 units/hr. No s/sx of bleeding or infusion issues. Hgb 11.3, plt stable at 229.   Goal of Therapy:  Heparin level 0.3-0.7 units/ml  Monitor platelets by anticoagulation protocol: Yes  Vanc AUC  400-550   Plan:  Increase heparin gtt at 1750 units/hr Check 8 hr heparin level Daily heparin level and CBC Monitor s/sx of bleeding/hematoma  Antonietta Jewel, PharmD, [place] Clinical Pharmacist  Pager: [phone] Phone: [phone] 06/30/2019, 7:13 AM

## 2019-06-30 NOTE — Progress Notes (Signed)
PROGRESS NOTE  Joseph Hoffman OMV:[id number] DOB: 28-Feb-1951 DOA: 06/26/2019 PCP: Orpah Melter, MD  HPI/Recap of past 24 hours: Joseph Hoffman  is a 68 y.o. male, with medical history significant ofhistory significant ofhypertension, diabetes mellitus, asthma,sCHFwith EF 45%, CAD, atrial fibrillation on Coumadin,AS, s/p ofAVR with mechanical valveon coumadin, OSA on CPAP, CKD stage III, depression with anxiety, who presents with shortness of breath, and chest pain, as well he reports worsening left great toe ulcer with surrounding cellulitis -Patient reports he started to complain of chest pressure over last 48 hours, accompanied by dyspnea, mainly upon exertion, he did take sublingual nitro, with improvement of his chest pain, but today reported did not have any significant event on his chest pain, patient with known mechanical aortic valve insufficiency, plan to have urgent cath by Dr. Claiborne Billings next week, for anticipated aortic valve replacement by CT surgery next month. -Patient was noted to have left lower extremity diabetic great toe ulcer, report is been going on for last 2 weeks, with some drainage, as well with surrounding cellulitis, he denies any fever, chills, he did not seek any medical care for it. - in ED patient was hypertensive, mildly tachypneic, retinae elevated at 2.1 from baseline 1.8, mild leukocytosis at 14 point 9K, high-sensitivity troponins were elevated at 96>99, INR supratherapeutic at 3.9, COVID-19 is negative, EKG showing A. fib, patient was seen by cardiology for chest pain, and hospitalist were requested to admit for multiple medical problems, with a presentation of infected diabetic ulcer.  06/30/19: Patient was seen and examined at his bedside.  Reports minimal pain in his left great toe.  He has no new cardiopulmonary symptoms.  Vascular surgery, heart failure team, and ID following.     Assessment/Plan: Active Problems:   OSA on CPAP   Chronic  anticoagulation   AS (aortic stenosis)   Essential hypertension   Acute combined systolic and diastolic heart failure (HCC)   CKD (chronic kidney disease), stage III (HCC)   Acute on chronic systolic (congestive) heart failure (HCC)   Depression   Foot ulcer, left (HCC)   Aortic valve regurgitation   SOB (shortness of breath)   Aortic valve disease  Sepsis secondary to L great toe diabetic ulcer with surrounding cellulitis Presented with leukocytosis with WBC 14 K, tachypnea with respiratory rate 31 Elevated CRP 9.5, sed rate 25 on 06/29/2019 Continue broad-spectrum IV antibiotics IV Vancomycin, IV Flagyl, IV Rocephin Vascular surgery following.  Left lower extremity arterial Dopplers completed. Infectious disease following.  Continue recommendations per infectious disease.  Left great toe diabetic ulcer/gangrenous with surrounding cellulitis in the setting of PVD Elevated CRP and ESR Continue broad-spectrum IV antibiotics empirically Vascular surgery and ID following  Resolved leukocytosis WBC noted to be trending up this morning 12K from 11K>>10K Afebrile Blood cultures negative x2 to date  Acute hypervolemic hyponatremia, asymptomatic Sodium 129 from 136 yesterday Restarted on diuretics per cardiology Continue daily BMPs  Resolving AKI on CKD3 Baseline creatinine appears to be 1.8 with GFR of 37 Presented with creatinine of 2.19 with GFR of 29 Cr 1.37 from 1.96 yesterday while off diuretics IV Lasix 80 mg twice daily restarted 06/30/19 per Cardiology Closely monitor blood pressure and renal function Net I&O -4.2 L since admission Continue to monitor urine output  Subtherapeutic INR INR today 1.4, off Coumadin Continue heparin drip due to possible procedure.  Acute on chronic combined diastolic and systolic CHF Presented with BNP greater than 600 Cardiology following Continue cardiac medications as recommended by  cardiology Post right and left heart cath  06/29/19 Continue strict I's and O's and daily weight  Acute on chronic hypoxic respiratory failure likely secondary to acute on chronic combined CHF Improving Wean off O2 supplementation Self reports on home O2 supplementation only at night  Severe aortic regurgitation post mechanical AVR On Coumadin, held due to possible procedure  Chest pain, resolved   Permanent atrial fibrillation Currently rate controlled Off Coumadin, held due to possible procedure  On heparin drip  OSA Continue CPAP at night  Hyperlipidemia Intolerant to statin On fenofibrate, icosapent, and zetia     Code Status: Full code  Family Communication: We will call family if okay with the patient.  Disposition Plan: Discharge to home possibly in 1-2 days or when cardiology, vascular surgery and infectious disease sign off.  Consultants:  Cardiology  Vascular surgery  Infectious disease  Procedures:  Heart cath on 06/29/2019  Antimicrobials:  IV vancomycin, Rocephin, IV Flagyl  DVT prophylaxis: Heparin drip.   Objective: Vitals:   06/30/19 0424 06/30/19 0425 06/30/19 0905 06/30/19 1156  BP: (!) 158/62 (!) 158/62  (!) 141/78  Pulse:  62 63 (!) 49  Resp: 14 14 15 18   Temp:  97.6 F (36.4 C)  97.6 F (36.4 C)  TempSrc:  Oral  Oral  SpO2:  96% 96% 96%  Weight:  115.9 kg    Height:        Intake/Output Summary (Last 24 hours) at 06/30/2019 1525 Last data filed at 06/30/2019 1150 Gross per 24 hour  Intake 2893.67 ml  Output 1575 ml  Net 1318.67 ml   Filed Weights   06/28/19 0609 06/29/19 0610 06/30/19 0425  Weight: 112.8 kg 114.2 kg 115.9 kg    Exam:   General: 68 y.o. year-old male well-developed well-nourished no acute distress.  Alert and oriented x3.  Cardiovascular: Irregular rate and rhythm no rubs or gallops no JVD thyromegaly.    Respiratory: Clear to auscultation no wheezes or rales.  Poor inspiratory effort.    Abdomen: Obese nontender nondistended bowel sounds  present.  Musculoskeletal: 1+ pitting edema lower extremities bilaterally.  Skin: Left great toe necrotic.  Psychiatry: Mood is appropriate for condition and setting.  Data Reviewed: CBC: Recent Labs  Lab 06/26/19 1315 06/27/19 0415 06/28/19 0602 06/29/19 0311 06/29/19 1044 06/29/19 1050 06/30/19 0551  WBC 14.9* 12.4* 11.1* 12.2*  --   --  10.0  NEUTROABS  --   --  7.8*  --   --   --   --   HGB 13.0 13.2 12.5* 12.8* 12.6* 12.2* 11.3*  HCT 41.1 43.1 40.6 39.9 37.0* 36.0* 35.7*  MCV 81.9 83.9 83.7 81.3  --   --  81.0  PLT 240 266 237 253  --   --  203   Basic Metabolic Panel: Recent Labs  Lab 06/26/19 1315 06/27/19 0415 06/28/19 0602 06/29/19 0311 06/29/19 1044 06/29/19 1050 06/30/19 0551  NA 134* 135 134* 133* 136 136 129*  K 4.5 3.9 4.2 3.6 3.6 3.5 4.1  CL 98 96* 97* 97*  --   --  97*  CO2 21* 25 24 21*  --   --  22  GLUCOSE 227* 170* 140* 98  --   --  353*  BUN 46* 45* 45* 57*  --   --  52*  CREATININE 2.12* 2.19* 1.92* 1.96*  --   --  1.37*  CALCIUM 9.7 9.2 9.0 9.1  --   --  8.5*   GFR:  Estimated Creatinine Clearance: 64.9 mL/min (A) (by C-G formula based on SCr of 1.37 mg/dL (H)). Liver Function Tests: No results for input(s): AST, ALT, ALKPHOS, BILITOT, PROT, ALBUMIN in the last 168 hours. No results for input(s): LIPASE, AMYLASE in the last 168 hours. No results for input(s): AMMONIA in the last 168 hours. Coagulation Profile: Recent Labs  Lab 06/27/19 0415 06/28/19 0602 06/28/19 1143 06/29/19 0311 06/30/19 0551  INR 3.4* 2.5* 2.4* 1.7* 1.4*   Cardiac Enzymes: No results for input(s): CKTOTAL, CKMB, CKMBINDEX, TROPONINI in the last 168 hours. BNP (last 3 results) No results for input(s): PROBNP in the last 8760 hours. HbA1C: No results for input(s): HGBA1C in the last 72 hours. CBG: Recent Labs  Lab 06/29/19 1617 06/29/19 2142 06/29/19 2307 06/30/19 0614 06/30/19 1152  GLUCAP 417* 494* 485* 313* 320*   Lipid Profile: No results for  input(s): CHOL, HDL, LDLCALC, TRIG, CHOLHDL, LDLDIRECT in the last 72 hours. Thyroid Function Tests: No results for input(s): TSH, T4TOTAL, FREET4, T3FREE, THYROIDAB in the last 72 hours. Anemia Panel: No results for input(s): VITAMINB12, FOLATE, FERRITIN, TIBC, IRON, RETICCTPCT in the last 72 hours. Urine analysis:    Component Value Date/Time   COLORURINE YELLOW 09/25/2012 1506   APPEARANCEUR CLEAR 09/25/2012 1506   LABSPEC 1.045 (H) 09/25/2012 1506   PHURINE 6.0 09/25/2012 1506   GLUCOSEU >1000 (A) 09/25/2012 1506   HGBUR NEGATIVE 09/25/2012 1506   BILIRUBINUR NEGATIVE 09/25/2012 1506   KETONESUR NEGATIVE 09/25/2012 1506   PROTEINUR NEGATIVE 09/25/2012 1506   UROBILINOGEN 0.2 09/25/2012 1506   NITRITE NEGATIVE 09/25/2012 1506   LEUKOCYTESUR NEGATIVE 09/25/2012 1506   Sepsis Labs: @LABRCNTIP (procalcitonin:4,lacticidven:4)  ) Recent Results (from the past 240 hour(s))  SARS Coronavirus 2 (CEPHEID - Performed in [facility] lab), Hosp Order     Status: None   Collection Time: 06/26/19  1:15 PM   Specimen: Nasopharyngeal Swab  Result Value Ref Range Status   SARS Coronavirus 2 NEGATIVE NEGATIVE Final    Comment: (NOTE) If result is NEGATIVE SARS-CoV-2 target nucleic acids are NOT DETECTED. The SARS-CoV-2 RNA is generally detectable in upper and lower  respiratory specimens during the acute phase of infection. The lowest  concentration of SARS-CoV-2 viral copies this assay can detect is 250  copies / mL. A negative result does not preclude SARS-CoV-2 infection  and should not be used as the sole basis for treatment or other  patient management decisions.  A negative result may occur with  improper specimen collection / handling, submission of specimen other  than nasopharyngeal swab, presence of viral mutation(s) within the  areas targeted by this assay, and inadequate number of viral copies  (<250 copies / mL). A negative result must be combined with clinical    observations, patient history, and epidemiological information. If result is POSITIVE SARS-CoV-2 target nucleic acids are DETECTED. The SARS-CoV-2 RNA is generally detectable in upper and lower  respiratory specimens dur ing the acute phase of infection.  Positive  results are indicative of active infection with SARS-CoV-2.  Clinical  correlation with patient history and other diagnostic information is  necessary to determine patient infection status.  Positive results do  not rule out bacterial infection or co-infection with other viruses. If result is PRESUMPTIVE POSTIVE SARS-CoV-2 nucleic acids MAY BE PRESENT.   A presumptive positive result was obtained on the submitted specimen  and confirmed on repeat testing.  While 2019 novel coronavirus  (SARS-CoV-2) nucleic acids may be present in the submitted sample  additional confirmatory testing may be necessary for epidemiological  and / or clinical management purposes  to differentiate between  SARS-CoV-2 and other Sarbecovirus currently known to infect humans.  If clinically indicated additional testing with an alternate test  methodology [phone]) is advised. The SARS-CoV-2 RNA is generally  detectable in upper and lower respiratory sp ecimens during the acute  phase of infection. The expected result is Negative. Fact Sheet for Patients:  StrictlyIdeas.no Fact Sheet for Healthcare Providers: BankingDealers.co.za This test is not yet approved or cleared by the [place] FDA and has been authorized for detection and/or diagnosis of SARS-CoV-2 by FDA under an Emergency Use Authorization (EUA).  This EUA will remain in effect (meaning this test can be used) for the duration of the COVID-19 declaration under Section 564(b)(1) of the Act, 21 U.S.C. section 360bbb-3(b)(1), unless the authorization is terminated or revoked sooner. Performed at [facility], [place] [address],  [place], [place] [address]   Culture, blood (routine x 2)     Status: None (Preliminary result)   Collection Time: 06/26/19  2:46 PM   Specimen: BLOOD  Result Value Ref Range Status   Specimen Description BLOOD RIGHT ANTECUBITAL  Final   Special Requests   Final    BOTTLES DRAWN AEROBIC AND ANAEROBIC Blood Culture results may not be optimal due to an excessive volume of blood received in culture bottles   Culture   Final    NO GROWTH 4 DAYS Performed at [facility], [place] [address], [place], [place] [address]    Report Status PENDING  Incomplete  Culture, blood (routine x 2)     Status: None (Preliminary result)   Collection Time: 06/26/19  2:52 PM   Specimen: BLOOD LEFT FOREARM  Result Value Ref Range Status   Specimen Description BLOOD LEFT FOREARM  Final   Special Requests   Final    BOTTLES DRAWN AEROBIC AND ANAEROBIC Blood Culture adequate volume   Culture   Final    NO GROWTH 4 DAYS Performed at [facility], [place] [address], [place], [place] [address]    Report Status PENDING  Incomplete      Studies: Vas US Doppler Pre Cabg  Result Date: 06/30/2019 PREOPERATIVE VASCULAR EVALUATION  Indications:  Pre CABG. Ulceration. Risk Factors: Hypertension, hyperlipidemia, Diabetes, coronary artery disease,               PAD. Limitations:  Cardiac arrythmia, Respiratory interference Performing Technologist: Birdena Crandall, [place] RVS Supporting Technologist: June Leap RDMS, RVT  Examination Guidelines: A complete evaluation includes B-mode imaging, spectral Doppler, color Doppler, and power Doppler as needed of all accessible portions of each vessel. Bilateral testing is considered an integral part of a complete examination. Limited examinations for reoccurring indications may be performed as noted.  Right Carotid Findings: +----------+--------+-------+--------+------------+----------------------------+             PSV cm/s EDV     Stenosis Describe     Comments                                            cm/s                                                        +----------+--------+-------+--------+------------+----------------------------+  CCA Prox   110      1                             mild intimal wall changes     +----------+--------+-------+--------+------------+----------------------------+  CCA Distal 110      1                             mild intimal wall changes     +----------+--------+-------+--------+------------+----------------------------+  ICA Prox   90       11      1-39%    heterogenous mild plaque                   +----------+--------+-------+--------+------------+----------------------------+  ICA Mid    46       9                             tortuous                      +----------+--------+-------+--------+------------+----------------------------+  ICA Distal 84       24                            tortuous                      +----------+--------+-------+--------+------------+----------------------------+  ECA        205      4                heterogenous moderate plaque at the                                                           origin                        +----------+--------+-------+--------+------------+----------------------------+ Portions of this table do not appear on this page. +----------+--------+-------+--------+------------+             PSV cm/s EDV cms Describe Arm Pressure  +----------+--------+-------+--------+------------+  Subclavian 94                        158           +----------+--------+-------+--------+------------+ +---------+--------+--+--------+-+  Vertebral PSV cm/s 53 EDV cm/s 5  +---------+--------+--+--------+-+ Left Carotid Findings: +----------+--------+--------+--------+--------+------------------------------+             PSV cm/s EDV cm/s Stenosis Describe Comments                        +----------+--------+--------+--------+--------+------------------------------+  CCA Prox   80       1                          mild  intimal wall changes       +----------+--------+--------+--------+--------+------------------------------+  CCA Distal 96       2                          mild intimal  wall changes       +----------+--------+--------+--------+--------+------------------------------+  ICA Prox                     Occluded          plaque with acoustic shadowing  +----------+--------+--------+--------+--------+------------------------------+  ICA Mid                                        no flow noted                   +----------+--------+--------+--------+--------+------------------------------+  ICA Distal                                     no flow noted                   +----------+--------+--------+--------+--------+------------------------------+  ECA        126                                 difficult to image              +----------+--------+--------+--------+--------+------------------------------+ +----------+--------+--------+--------+------------+  Subclavian PSV cm/s EDV cm/s Describe Arm Pressure  +----------+--------+--------+--------+------------+             139                        132           +----------+--------+--------+--------+------------+ Reversed flow just distal to the bulb occluded ICA ABI Findings: +--------+------------------+-----+---------+--------+  Right    Rt Pressure (mmHg) Index Waveform  Comment   +--------+------------------+-----+---------+--------+  Brachial 158                      triphasic           +--------+------------------+-----+---------+--------+ +--------+------------------+-----+--------+----------------+  Left     Lt Pressure (mmHg) Index Waveform Comment           +--------+------------------+-----+--------+----------------+  Brachial 132                      biphasic sounds triphasic  +--------+------------------+-----+--------+----------------+ +-------+---------------+----------------+  ABI/TBI Today's ABI/TBI Previous ABI/TBI   +-------+---------------+----------------+  Right                   [place]/0.37           +-------+---------------+----------------+  Left                    0.85./0.73        +-------+---------------+----------------+ ABI/TBI exam completed 06/26/2019.  Right Doppler Findings: +-----------+--------+-----+---------+--------+  Site        Pressure Index Doppler   Comments  +-----------+--------+-----+---------+--------+  Brachial    158            triphasic           +-----------+--------+-----+---------+--------+  Radial                     triphasic           +-----------+--------+-----+---------+--------+  Ulnar                      triphasic           +-----------+--------+-----+---------+--------+  Palmar Arch                          normal    +-----------+--------+-----+---------+--------+  Left Doppler Findings: +-----------+--------+-----+---------+----------------+  Site        Pressure Index Doppler   Comments          +-----------+--------+-----+---------+----------------+  Brachial    132            biphasic  sounds triphasic  +-----------+--------+-----+---------+----------------+  Radial                     triphasic                   +-----------+--------+-----+---------+----------------+  Ulnar                      triphasic                   +-----------+--------+-----+---------+----------------+  Palmar Arch                          normal            +-----------+--------+-----+---------+----------------+  Summary: Right Carotid: Velocities in the right ICA are consistent with a 1-39% stenosis. 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 Vertebrals:  Bilateral vertebral arteries demonstrate antegrade flow. Subclavians: Normal flow hemodynamics were seen in bilateral subclavian              arteries. Right ABI: Resting right ankle-brachial index indicates noncompressible right lower extremity arteries.The right toe-brachial index is abnormal. Left ABI: Resting left  ankle-brachial index indicates mild left lower extremity arterial disease. The left toe-brachial index is abnormal. ABI may be falsely elevated. Right Upper Extremity: Doppler waveforms remain within normal limits with right radial compression. Doppler waveforms remain within normal limits with right ulnar compression. Left Upper Extremity: Doppler waveforms remain within normal limits with left radial compression. Doppler waveforms remain within normal limits with left ulnar compression.     Preliminary    Vas [place] Lower Extremity Arterial Duplex  Result Date: 06/30/2019 LOWER EXTREMITY ARTERIAL DUPLEX STUDY Indications: Ulceration, and peripheral artery disease. High Risk Factors: Hypertension, hyperlipidemia, Diabetes. Other Factors: Permanent A-Fib,KD stage 3, Chronic combimed CHF,.  Current ABI: 06/26/2019 RT [place] Lt 0.85 Limitations: Constant drawing of the leg when imaging the lower leg, Cardiac              arrythmia Comparison Study: No imaging comparison available only ABI/TBI exam of                   06/26/2019 Performing Technologist: Toma Copier RVS  Examination Guidelines: A complete evaluation includes B-mode imaging, spectral Doppler, color Doppler, and power Doppler as needed of all accessible portions of each vessel. Bilateral testing is considered an integral part of a complete examination. Limited examinations for reoccurring indications may be performed as noted.  +--------+--------+-----+--------+--------+--------------+  RIGHT    PSV cm/s Ratio Stenosis Waveform Comments        +--------+--------+-----+--------+--------+--------------+  CFA Prox 86                      biphasic minimal plaque  +--------+--------+-----+--------+--------+--------------+  +----------+--------+-----+--------+---------+--------------+  LEFT       PSV cm/s Ratio Stenosis Waveform  Comments        +----------+--------+-----+--------+---------+--------------+  CFA Prox   105  biphasic                   +----------+--------+-----+--------+---------+--------------+  CFA Distal 145                     biphasic  minimal plaque  +----------+--------+-----+--------+---------+--------------+  DFA        181                     biphasic  mild plaque     +----------+--------+-----+--------+---------+--------------+  SFA Prox   164                     biphasic  mild plaque     +----------+--------+-----+--------+---------+--------------+  SFA Mid    120                     biphasic  mild plaque     +----------+--------+-----+--------+---------+--------------+  SFA Distal 125                     triphasic mild plaque     +----------+--------+-----+--------+---------+--------------+  POP Prox   108                     biphasic  miild plaque    +----------+--------+-----+--------+---------+--------------+  POP Distal 83                      biphasic  mild plaque     +----------+--------+-----+--------+---------+--------------+  ATA Distal 29                                                +----------+--------+-----+--------+---------+--------------+  PTA Mid    56                      biphasic  mild plaque     +----------+--------+-----+--------+---------+--------------+  PTA Distal 148                     biphasic  mild plaque     +----------+--------+-----+--------+---------+--------------+ Technically difficult to evaluate due to cardiac arrythmia and constant drawing of the lower leg. There is mild irregular plaque throughout. Unable to note a significant stenosis throughout.     Preliminary     Scheduled Meds:  cholecalciferol  1,000 Units Oral Daily   digoxin  0.125 mg Oral QHS   escitalopram  10 mg Oral QPM   ezetimibe  10 mg Oral Daily   feeding supplement  1 Container Oral TID BM   fenofibrate  160 mg Oral Daily   furosemide  80 mg Intravenous BID   hydrALAZINE  12.5 mg Oral BID   Icosapent Ethyl  2 g Oral BID   insulin aspart  0-15 Units Subcutaneous TID WC   insulin aspart  0-5 Units  Subcutaneous QHS   insulin aspart  10 Units Subcutaneous TID WC   insulin glargine  34 Units Subcutaneous BID   isosorbide mononitrate  30 mg Oral Daily   metoprolol succinate  37.5 mg Oral BID   mometasone-formoterol  2 puff Inhalation BID   multivitamin  1 tablet Oral Daily   nutrition supplement (JUVEN)  1 packet Oral BID BM   saccharomyces boulardii  250 mg Oral Q M,W,F   senna-docusate  2 tablet Oral BID   sodium  chloride flush  3 mL Intravenous Q12H   vitamin C  1,000 mg Oral QPM    Continuous Infusions:  sodium chloride 50 mL/hr at 06/30/19 0635   sodium chloride     cefTRIAXone (ROCEPHIN)  IV 2 g (06/29/19 1401)   heparin 1,750 Units/hr (06/30/19 1018)   metronidazole 500 mg (06/30/19 5749)   vancomycin 1,000 mg (06/29/19 1715)     LOS: 4 days     Kayleen Memos, MD [facility] Pager [phone]  If 7PM-7AM, please contact night-coverage [email or web address] Password [email or web address] 06/30/2019, 3:25 PM

## 2019-06-30 NOTE — Progress Notes (Signed)
Advanced Heart Failure Rounding Note   Subjective:    Feels better today. No CP. Less SOB. No orthopnea or PND.   Remains afebrile. Renal function stable post cath. Weight up 4 pounds.   Objective:   Weight Range:  Vital Signs:   Temp:  [97.6 F (36.4 C)] 97.6 F (36.4 C) (07/28 1156) Pulse Rate:  [49-65] 49 (07/28 1156) Resp:  [14-20] 18 (07/28 1156) BP: (132-158)/(62-98) 141/78 (07/28 1156) SpO2:  [96 %-98 %] 96 % (07/28 1156) Weight:  [115.9 kg] 115.9 kg (07/28 0425) Last BM Date: 06/28/19  Weight change: Filed Weights   06/28/19 0609 06/29/19 0610 06/30/19 0425  Weight: 112.8 kg 114.2 kg 115.9 kg    Intake/Output:   Intake/Output Summary (Last 24 hours) at 06/30/2019 1348 Last data filed at 06/30/2019 1150 Gross per 24 hour  Intake 2893.67 ml  Output 1900 ml  Net 993.67 ml     Physical Exam: General:  Sitting up in bed No resp difficulty HEENT: normal Neck: supple. JVP to jaw . Carotids 2+ bilat; no bruits. No lymphadenopathy or thryomegaly appreciated. Cor: PMI nondisplaced. Irregular rate & rhythm. 2/6 AS/AI Lungs: clear Abdomen: obese soft, nontender, nondistended. No hepatosplenomegaly. No bruits or masses. Good bowel sounds. Extremities: no cyanosis, clubbing, rash, edema  L great toe with large wound with eschar. Surrounding erythema up to ankle Neuro: alert & orientedx3, cranial nerves grossly intact. moves all 4 extremities w/o difficulty. Affect pleasant  Telemetry:  AF 50s  2.6 s pause overnight Personally reviewed   Labs: Basic Metabolic Panel: Recent Labs  Lab 06/26/19 1315 06/27/19 0415 06/28/19 0602 06/29/19 0311 06/29/19 1044 06/29/19 1050 06/30/19 0551  NA 134* 135 134* 133* 136 136 129*  K 4.5 3.9 4.2 3.6 3.6 3.5 4.1  CL 98 96* 97* 97*  --   --  97*  CO2 21* 25 24 21*  --   --  22  GLUCOSE 227* 170* 140* 98  --   --  353*  BUN 46* 45* 45* 57*  --   --  52*  CREATININE 2.12* 2.19* 1.92* 1.96*  --   --  1.37*  CALCIUM 9.7  9.2 9.0 9.1  --   --  8.5*    Liver Function Tests: No results for input(s): AST, ALT, ALKPHOS, BILITOT, PROT, ALBUMIN in the last 168 hours. No results for input(s): LIPASE, AMYLASE in the last 168 hours. No results for input(s): AMMONIA in the last 168 hours.  CBC: Recent Labs  Lab 06/26/19 1315 06/27/19 0415 06/28/19 0602 06/29/19 0311 06/29/19 1044 06/29/19 1050 06/30/19 0551  WBC 14.9* 12.4* 11.1* 12.2*  --   --  10.0  NEUTROABS  --   --  7.8*  --   --   --   --   HGB 13.0 13.2 12.5* 12.8* 12.6* 12.2* 11.3*  HCT 41.1 43.1 40.6 39.9 37.0* 36.0* 35.7*  MCV 81.9 83.9 83.7 81.3  --   --  81.0  PLT 240 266 237 253  --   --  229    Cardiac Enzymes: No results for input(s): CKTOTAL, CKMB, CKMBINDEX, TROPONINI in the last 168 hours.  BNP: BNP (last 3 results) Recent Labs    05/12/19 1426 05/28/19 0617 06/26/19 1315  BNP 297.0* 342.8* 669.2*    ProBNP (last 3 results) No results for input(s): PROBNP in the last 8760 hours.    Other results:  Imaging: No results found.   Medications:     Scheduled Medications: .  cholecalciferol  1,000 Units Oral Daily  . digoxin  0.125 mg Oral QHS  . escitalopram  10 mg Oral QPM  . ezetimibe  10 mg Oral Daily  . feeding supplement  1 Container Oral TID BM  . fenofibrate  160 mg Oral Daily  . furosemide  80 mg Intravenous BID  . hydrALAZINE  12.5 mg Oral BID  . Icosapent Ethyl  2 g Oral BID  . insulin aspart  0-15 Units Subcutaneous TID WC  . insulin aspart  0-5 Units Subcutaneous QHS  . insulin aspart  10 Units Subcutaneous TID WC  . insulin glargine  34 Units Subcutaneous BID  . isosorbide mononitrate  30 mg Oral Daily  . metoprolol succinate  37.5 mg Oral BID  . mometasone-formoterol  2 puff Inhalation BID  . multivitamin  1 tablet Oral Daily  . nutrition supplement (JUVEN)  1 packet Oral BID BM  . saccharomyces boulardii  250 mg Oral Q M,W,F  . senna-docusate  2 tablet Oral BID  . sodium chloride flush  3 mL  Intravenous Q12H  . vitamin C  1,000 mg Oral QPM    Infusions: . sodium chloride 50 mL/hr at 06/30/19 0635  . sodium chloride    . cefTRIAXone (ROCEPHIN)  IV 2 g (06/29/19 1401)  . heparin 1,750 Units/hr (06/30/19 1018)  . metronidazole 500 mg (06/30/19 8453)  . vancomycin 1,000 mg (06/29/19 1715)    PRN Medications: sodium chloride, acetaminophen **OR** acetaminophen, albuterol, HYDROmorphone (DILAUDID) injection, loperamide, nitroGLYCERIN, oxyCODONE, sodium chloride flush  Allergies  Allergen Reactions  . Iohexol Anaphylaxis  . Niacin And Related     Flushing with immediate realese  . Penicillins Other (See Comments)    Unknown.[name]  Did it involve swelling of the face/tongue/throat, SOB, or low BP? Unknown Did it involve sudden or severe rash/hives, skin peeling, or any reaction on the inside of your mouth or nose? Unknown Did you need to seek medical attention at a hospital or doctor's office? Unknown When did it last happen?Childhood If all above answers are "NO", may proceed with cephalosporin use.     Assessment/Plan:   1. Acute on Chronic Biventricular Heart Failure  - ECHO 05/2019 EF 30-35% Severe AI, severe RV dysfunction - volume status elevated. Renal function stable post cath - begin diuresis with lasix 80IV bid. Watch renal function closely - On Toprol and digoxin. No ACE/ARB/ARNI with A/CKD for now  2. Chest Pain, CAD - LHC 06/29/19: Multivessel coronary disease, 50% mid LAD, 80% large OM2, 50% mid RCA, and 80-90% ostial rPDA stenoses.  - currently CP free.  - intolerant of statins. On Vascepa & zetia - Possible CABG at time of re-do AVR  3. Severe Aortic insuffiency in setting of mechanical AVR - H/O Mechanical AVR 2014 [facility] Jude  - with DM2 wound question possible endocarditis however no obvious vegetation on TEE and I went back and looked at echos from past year and AI was present then so doubt acute endocarditis  - pending  re-do high-risk AVR +/- CABG when other medica issues stable. Spoke with TCTS and will postpone planned surgery for now   4. L foot Diabetic Foot Ulcer - Bcx negative - on brrad spectrum abx. - [facility] planning arterial study - I have consulted ID to help with duration of abx in setting of need for possible re-do AVR  5. Chronic A fib  - Rate controlled on Toprol XL and digoxin. No won heparin  6. OSA -  Needs CPAP every night.   7. Diabetes  8. CKD Stage IV  - Creatinine baseline ~2.   9. Bradycardia, nighttime - check dig level - cut metoprolol down to 25 bid  Length of Stay: 4   Madesyn Ast MD 06/30/2019, 1:48 PM  Advanced Heart Failure Team Pager [phone] (M-F; [place])  Please contact [facility] for night-coverage after hours (4p -7a ) and weekends on [email or web address]

## 2019-06-30 NOTE — Progress Notes (Signed)
Left lower extremity arterial duplex completed. Preliminary results in Chart review CV Proc. [place] Tonette Koehne,RVS 06/30/2019 2:27 PM

## 2019-06-30 NOTE — Progress Notes (Signed)
Advanced Heart Failure Rounding Note   Subjective:    Feels better today. No CP. Less SOB. No orthopnea or PND.   Remains afebrile. Renal function stable post cath. Weight up 4 pounds.   Objective:   Weight Range:  Vital Signs:   Temp:  [97.6 F (36.4 C)] 97.6 F (36.4 C) (07/28 1156) Pulse Rate:  [49-65] 49 (07/28 1156) Resp:  [14-20] 18 (07/28 1156) BP: (132-158)/(62-98) 141/78 (07/28 1156) SpO2:  [96 %-98 %] 96 % (07/28 1156) Weight:  [115.9 kg] 115.9 kg (07/28 0425) Last BM Date: 06/28/19  Weight change: Filed Weights   06/28/19 0609 06/29/19 0610 06/30/19 0425  Weight: 112.8 kg 114.2 kg 115.9 kg    Intake/Output:   Intake/Output Summary (Last 24 hours) at 06/30/2019 1335 Last data filed at 06/30/2019 1150 Gross per 24 hour  Intake 2893.67 ml  Output 1900 ml  Net 993.67 ml     Physical Exam: General:  Sitting up in bed No resp difficulty HEENT: normal Neck: supple. JVP to jaw . Carotids 2+ bilat; no bruits. No lymphadenopathy or thryomegaly appreciated. Cor: PMI nondisplaced. Irregular rate & rhythm. 2/6 AS/AI Lungs: clear Abdomen: obese soft, nontender, nondistended. No hepatosplenomegaly. No bruits or masses. Good bowel sounds. Extremities: no cyanosis, clubbing, rash, edema  L great toe with large wound with eschar. Surrounding erythema up to ankle Neuro: alert & orientedx3, cranial nerves grossly intact. moves all 4 extremities w/o difficulty. Affect pleasant  Telemetry:  AF 50s  2.6 s pause overnight Personally reviewed   Labs: Basic Metabolic Panel: Recent Labs  Lab 06/26/19 1315 06/27/19 0415 06/28/19 0602 06/29/19 0311 06/29/19 1044 06/29/19 1050 06/30/19 0551  NA 134* 135 134* 133* 136 136 129*  K 4.5 3.9 4.2 3.6 3.6 3.5 4.1  CL 98 96* 97* 97*  --   --  97*  CO2 21* 25 24 21*  --   --  22  GLUCOSE 227* 170* 140* 98  --   --  353*  BUN 46* 45* 45* 57*  --   --  52*  CREATININE 2.12* 2.19* 1.92* 1.96*  --   --  1.37*  CALCIUM 9.7  9.2 9.0 9.1  --   --  8.5*    Liver Function Tests: No results for input(s): AST, ALT, ALKPHOS, BILITOT, PROT, ALBUMIN in the last 168 hours. No results for input(s): LIPASE, AMYLASE in the last 168 hours. No results for input(s): AMMONIA in the last 168 hours.  CBC: Recent Labs  Lab 06/26/19 1315 06/27/19 0415 06/28/19 0602 06/29/19 0311 06/29/19 1044 06/29/19 1050 06/30/19 0551  WBC 14.9* 12.4* 11.1* 12.2*  --   --  10.0  NEUTROABS  --   --  7.8*  --   --   --   --   HGB 13.0 13.2 12.5* 12.8* 12.6* 12.2* 11.3*  HCT 41.1 43.1 40.6 39.9 37.0* 36.0* 35.7*  MCV 81.9 83.9 83.7 81.3  --   --  81.0  PLT 240 266 237 253  --   --  229    Cardiac Enzymes: No results for input(s): CKTOTAL, CKMB, CKMBINDEX, TROPONINI in the last 168 hours.  BNP: BNP (last 3 results) Recent Labs    05/12/19 1426 05/28/19 0617 06/26/19 1315  BNP 297.0* 342.8* 669.2*    ProBNP (last 3 results) No results for input(s): PROBNP in the last 8760 hours.    Other results:  Imaging:  No results found.   Medications:     Scheduled  Medications: . cholecalciferol  1,000 Units Oral Daily  . digoxin  0.125 mg Oral QHS  . escitalopram  10 mg Oral QPM  . ezetimibe  10 mg Oral Daily  . feeding supplement  1 Container Oral TID BM  . fenofibrate  160 mg Oral Daily  . hydrALAZINE  12.5 mg Oral BID  . Icosapent Ethyl  2 g Oral BID  . insulin aspart  0-15 Units Subcutaneous TID WC  . insulin aspart  0-5 Units Subcutaneous QHS  . insulin aspart  10 Units Subcutaneous TID WC  . insulin glargine  34 Units Subcutaneous BID  . isosorbide mononitrate  30 mg Oral Daily  . metoprolol succinate  37.5 mg Oral BID  . mometasone-formoterol  2 puff Inhalation BID  . multivitamin  1 tablet Oral Daily  . nutrition supplement (JUVEN)  1 packet Oral BID BM  . saccharomyces boulardii  250 mg Oral Q M,W,F  . senna-docusate  2 tablet Oral BID  . sodium chloride flush  3 mL Intravenous Q12H  . vitamin C  1,000  mg Oral QPM     Infusions: . sodium chloride 50 mL/hr at 06/30/19 0635  . sodium chloride    . cefTRIAXone (ROCEPHIN)  IV 2 g (06/29/19 1401)  . heparin 1,750 Units/hr (06/30/19 1018)  . metronidazole 500 mg (06/30/19 9242)  . vancomycin 1,000 mg (06/29/19 1715)     PRN Medications:  sodium chloride, acetaminophen **OR** acetaminophen, albuterol, HYDROmorphone (DILAUDID) injection, loperamide, nitroGLYCERIN, oxyCODONE, sodium chloride flush  Allergies  Allergen Reactions  . Iohexol Anaphylaxis  . Niacin And Related     Flushing with immediate realese  . Penicillins Other (See Comments)    Unknown.[name]  Did it involve swelling of the face/tongue/throat, SOB, or low BP? Unknown Did it involve sudden or severe rash/hives, skin peeling, or any reaction on the inside of your mouth or nose? Unknown Did you need to seek medical attention at a hospital or doctor's office? Unknown When did it last happen?Childhood If all above answers are "NO", may proceed with cephalosporin use.     Assessment/Plan:   1. Acute on Chronic Biventricular Heart Failure  - ECHO 05/2019 EF 30-35% Severe AI, severe RV dysfunction - volume status elevated. Renal function stable post cath - begin diuresis with lasix 80IV bid. Watch renal function closely - On Toprol and digoxin. No ACE/ARB/ARNI with A/CKD for now  2. Chest Pain, CAD - LHC 06/29/19: Multivessel coronary disease, 50% mid LAD, 80% large OM2, 50% mid RCA, and 80-90% ostial rPDA stenoses.  - currently CP free.  - intolerant of statins. On Vascepa - Possible CABG at time of re-do AVR  3. Severe Aortic insuffiency in setting of mechanical AVR - H/O Mechanical AVR 2014 [facility] Jude  - with DM2 wound question possible endocarditis however no obvious vegetation on TEE and I went back and looked at echos from past year and AI was present then so doubt acute endocarditis  - pending re-do high-risk AVR +/- CABG when other  medica issues stable. Spoke with TCTS and will postpone planned surgery for now   4. L foot Diabetic Foot Ulcer - Bcx negative - on brrad spectrum abx. - VVS planning arterial study - I have consulted ID to help with duration of abx in setting of need for possible re-do AVR  5. Chronic A fib  - Rate controlled on Toprol XL and digoxin. No won heparin  6. OSA - Needs CPAP every night.  7. Diabetes  8. CKD Stage IV  - Creatinine baseline ~2.   9. Bradycardia, nighttime - check dig level - cut metoprolol down to 25 bid  Length of Stay: 4   Jalyn Dutta MD 06/30/2019, 1:35 PM  Advanced Heart Failure Team Pager [phone] (M-F; [place])  Please contact [facility] for night-coverage after hours (4p -7a ) and weekends on [email or web address]

## 2019-06-30 NOTE — Progress Notes (Signed)
CCMD notified of patient brady to the 30s non sustained and 2.69 second pause.  Patient asymptomatic.  RN notified triad.

## 2019-06-30 NOTE — Progress Notes (Signed)
Inpatient Diabetes Program Recommendations  AACE/ADA: New Consensus Statement on Inpatient Glycemic Control (2015)  Target Ranges:  Prepandial:   less than 140 mg/dL      Peak postprandial:   less than 180 mg/dL (1-2 hours)      Critically ill patients:  140 - 180 mg/dL   Lab Results  Component Value Date   GLUCAP 320 (H) 06/30/2019   HGBA1C 8.7 (H) 05/13/2019    Review of Glycemic Control Results for Joseph Hoffman, Joseph Hoffman (MRN [id number]) as of 06/30/2019 13:36  Ref. Range 06/29/2019 16:17 06/29/2019 21:42 06/29/2019 23:07 06/30/2019 06:14 06/30/2019 11:52  Glucose-Capillary Latest Ref Range: 70 - 99 mg/dL 417 (H) 494 (H) 485 (H) 313 (H) 320 (H)   Inpatient Diabetes Program Recommendations:   Steroids now discontinued so expect CBGs to start decreasing. Consider: -Increase Lantus to 36 units bid -Increase Novolog meal coverage to 12 units tid if eats 50% -Increase Novolog correction to resistant tid + hs 0-5 units  Thank you, Bethena Roys E. Giovoni Bunch, RN, MSN, CDE  Diabetes Coordinator Inpatient Glycemic Control Team Team Pager [phone] (8am-5pm) 06/30/2019 1:40 PM

## 2019-06-30 NOTE — Consult Note (Signed)
[facility]    Date of Admission:  06/26/2019     Total days of antibiotics 5               Reason for Consult: Cellulitis   Referring Provider: [place] Crane Primary Care Provider: Orpah Melter, MD   Assessment/Plan:  Mr. Hinchman is a 68 year old male with poorly controlled diabetes and severe cardiac dysfunction with heart failure and new diabetic foot ulcer with gangrenous changes and surrounding cellulitis. Concern for osteomyelitis given duration and likely depth of infection. Currently on broad spectrum antimicrobial therapy with vancomycin, ceftriaxone and metronidazole.  Severe Aortic Regurgitation - Undergoing cardiac catheterization today with CVTS planning on redo Aortic Valve replacement  Gangrenous left first toe with surrounding cellulitis  - Concern for underlying osteomyelitis. Will check x-ray to determine exent if present. It is likely that he is going to need amputation of the left first ray as antibiotics are unlikely to resolve this. Will continue broad spectrum vancomycin and ceftriaxone for now. Discontinue metronidazole. Monitor cultures and narrow antibiotics as able.   Diabetes - Poorly controlled at present with most recent A1c of 8.7 about 1 month ago. Discussed importance of controlling blood sugars as elevated blood sugars lead to worsened healing and increased rates of infection. Continue management per primary team.  CKD Stage 3 - Improved since admission with most recent creatinine of 1.37.   Healthcare Maintenance - Born between [phone] and at risk for Hepatitis C. Will check Hepatitis C antibody.    Active Problems:   OSA on CPAP   Chronic anticoagulation   AS (aortic stenosis)   Essential hypertension   Acute combined systolic and diastolic heart failure (HCC)   CKD (chronic kidney disease), stage III (HCC)   Acute on chronic systolic (congestive) heart failure (HCC)   Depression   Foot ulcer, left (HCC)   Aortic  valve regurgitation   SOB (shortness of breath)   Aortic valve disease   . cholecalciferol  1,000 Units Oral Daily  . digoxin  0.125 mg Oral QHS  . escitalopram  10 mg Oral QPM  . ezetimibe  10 mg Oral Daily  . feeding supplement  1 Container Oral TID BM  . fenofibrate  160 mg Oral Daily  . hydrALAZINE  12.5 mg Oral BID  . Icosapent Ethyl  2 g Oral BID  . insulin aspart  0-15 Units Subcutaneous TID WC  . insulin aspart  0-5 Units Subcutaneous QHS  . insulin aspart  10 Units Subcutaneous TID WC  . insulin glargine  34 Units Subcutaneous BID  . isosorbide mononitrate  30 mg Oral Daily  . metoprolol succinate  37.5 mg Oral BID  . mometasone-formoterol  2 puff Inhalation BID  . multivitamin  1 tablet Oral Daily  . nutrition supplement (JUVEN)  1 packet Oral BID BM  . saccharomyces boulardii  250 mg Oral Q M,W,F  . senna-docusate  2 tablet Oral BID  . sodium chloride flush  3 mL Intravenous Q12H  . vitamin C  1,000 mg Oral QPM     HPI: IAIN SAWCHUK is a 68 y.o. male with previous medical history of diabetes, aortic stenosis status post aortic valve replacement in October 2013, hypertension, heart failure, atrial fibrillation, obstructive sleep apnea on CPAP, CKD stage III, and hepatitis in 2003 admitted with shortness of breath, chest pain and a worsening left great toe ulcer with surrounding cellulitis.  Began having chest pain 48 hours prior  to presentation with dyspnea initially resolved with sublingual nitroglycerin, however had a secondary episode with no exertional effort.  Was scheduled to have a urgent catheterization with anticipation of aortic valve replacement.  He also first noticed an ulcer on his left great toe going on for approximately 2 weeks and described as having drainage with surrounding cellulitis.  In the ED he was hypertensive, tachycardic, with leukocytosis of 14.9. Sars-CoV-2 testing negative. Chest x-ray with cardiomegaly and no evidence of pneumonia. Mr.  Penny has been afebrile since admission. He is currently receiving broad spectrum antibiotics with vancomycin, ceftriaxone and metronidazole. Blood cultures drawn on 7/24 without growth in 4 days. Mr. Cossey is undergoing cardiac catheterization today. Evaluated by Vascular surgery with gangrenous changes of the left first toe with cellulitis complicated by serve cardiac dysfunction. He is scheduled for possible intervention on 7/30. ID has been consulted for antibiotic recommendations.   Mr. Deeley first sustained a wound to his left foot a couple of months ago. He has had a 7 day course of Bactrim in June. He showed me a pill bottle with multiple pills remaining in it. He was supposed to see podiatry, however due to hospitalizations was unable to complete the visit.    Review of Systems: Review of Systems  Constitutional: Negative for chills, fever and weight loss.  Respiratory: Negative for cough, shortness of breath and wheezing.   Cardiovascular: Negative for chest pain and leg swelling.  Gastrointestinal: Negative for abdominal pain, constipation, diarrhea, nausea and vomiting.  Skin: Negative for rash.     Past Medical History:  Diagnosis Date  . Anxiety   . Aortic stenosis   . Arthritis   . Asthma   . Asymptomatic LV dysfunction, EF 45%, normal coronary arteries on cardiac cath 09/18/12 09/30/2012  . CHF (congestive heart failure) ([place])   . Chronic anticoagulation, on Xarelto prior to admit 09/30/2012  . DM (diabetes mellitus) ([place]) 09/30/2012  . Hepatitis 2003  . Hypertension   . Myocardial infarction ([place])   . Permanent atrial fibrillation, since 1994 09/30/2012   stress test 02/08/12- normal study, no significant ischemia  . S/P AVR (aortic valve replacement), 09/30/2012   a. s/p mechcanical AVR in 09/2012 (on Coumadin)  . Sleep apnea    uses CPAP    Social History   Tobacco Use  . Smoking status: Former Smoker    Packs/day: 0.25    Years: 10.00    Pack years:  2.50    Types: Cigarettes    Quit date: 09/25/1982    Years since quitting: 36.7  . Smokeless tobacco: Former Network engineer Use Topics  . Alcohol use: Yes    Comment: occ. beer/wine  . Drug use: No    Family History  Problem Relation Age of Onset  . Hypertension Mother   . Diabetes Father   . Heart attack Brother 73  . Hyperlipidemia Brother 74       stents placed    Allergies  Allergen Reactions  . Iohexol Anaphylaxis  . Niacin And Related     Flushing with immediate realese  . Penicillins Other (See Comments)    Unknown.[name]  Did it involve swelling of the face/tongue/throat, SOB, or low BP? Unknown Did it involve sudden or severe rash/hives, skin peeling, or any reaction on the inside of your mouth or nose? Unknown Did you need to seek medical attention at a hospital or doctor's office? Unknown When did it last happen?Childhood If all above answers are "  NO", may proceed with cephalosporin use.    OBJECTIVE: Blood pressure (!) 141/78, pulse (!) 49, temperature 97.6 F (36.4 C), temperature source Oral, resp. rate 18, height 5\' 11"  (1.803 m), weight 115.9 kg, SpO2 96 %.  Physical Exam Constitutional:      General: He is not in acute distress.    Appearance: He is well-developed. He is obese.     Comments: Lying in bed elevated; pleasant  Cardiovascular:     Rate and Rhythm: Normal rate and regular rhythm.     Heart sounds: Normal heart sounds.  Pulmonary:     Effort: Pulmonary effort is normal.     Breath sounds: Normal breath sounds.  Musculoskeletal:     Comments: Left lower extremity appears larger than right lower extremity with mild edema.  Left great toe with gangrenous discoloration partially dry and partially moist.  Skin:    General: Skin is warm and dry.  Neurological:     Mental Status: He is alert and oriented to person, place, and time.  Psychiatric:        Behavior: Behavior normal.        Thought Content: Thought  content normal.        Judgment: Judgment normal.       Lab Results Lab Results  Component Value Date   WBC 10.0 06/30/2019   HGB 11.3 (L) 06/30/2019   HCT 35.7 (L) 06/30/2019   MCV 81.0 06/30/2019   PLT 229 06/30/2019    Lab Results  Component Value Date   CREATININE 1.37 (H) 06/30/2019   BUN 52 (H) 06/30/2019   NA 129 (L) 06/30/2019   K 4.1 06/30/2019   CL 97 (L) 06/30/2019   CO2 22 06/30/2019    Lab Results  Component Value Date   ALT 28 05/28/2019   AST 37 05/28/2019   ALKPHOS 27 (L) 05/28/2019   BILITOT 1.0 05/28/2019     Microbiology: Recent Results (from the past 240 hour(s))  SARS Coronavirus 2 (CEPHEID - Performed in [facility] hospital lab), Hosp Order     Status: None   Collection Time: 06/26/19  1:15 PM   Specimen: Nasopharyngeal Swab  Result Value Ref Range Status   SARS Coronavirus 2 NEGATIVE NEGATIVE Final    Comment: (NOTE) If result is NEGATIVE SARS-CoV-2 target nucleic acids are NOT DETECTED. The SARS-CoV-2 RNA is generally detectable in upper and lower  respiratory specimens during the acute phase of infection. The lowest  concentration of SARS-CoV-2 viral copies this assay can detect is 250  copies / mL. A negative result does not preclude SARS-CoV-2 infection  and should not be used as the sole basis for treatment or other  patient management decisions.  A negative result may occur with  improper specimen collection / handling, submission of specimen other  than nasopharyngeal swab, presence of viral mutation(s) within the  areas targeted by this assay, and inadequate number of viral copies  (<250 copies / mL). A negative result must be combined with clinical  observations, patient history, and epidemiological information. If result is POSITIVE SARS-CoV-2 target nucleic acids are DETECTED. The SARS-CoV-2 RNA is generally detectable in upper and lower  respiratory specimens dur ing the acute phase of infection.  Positive  results are  indicative of active infection with SARS-CoV-2.  Clinical  correlation with patient history and other diagnostic information is  necessary to determine patient infection status.  Positive results do  not rule out bacterial infection or co-infection with other viruses.  If result is PRESUMPTIVE POSTIVE SARS-CoV-2 nucleic acids MAY BE PRESENT.   A presumptive positive result was obtained on the submitted specimen  and confirmed on repeat testing.  While 2019 novel coronavirus  (SARS-CoV-2) nucleic acids may be present in the submitted sample  additional confirmatory testing may be necessary for epidemiological  and / or clinical management purposes  to differentiate between  SARS-CoV-2 and other Sarbecovirus currently known to infect humans.  If clinically indicated additional testing with an alternate test  methodology [phone]) is advised. The SARS-CoV-2 RNA is generally  detectable in upper and lower respiratory sp ecimens during the acute  phase of infection. The expected result is Negative. Fact Sheet for Patients:  StrictlyIdeas.no Fact Sheet for Healthcare Providers: BankingDealers.co.za This test is not yet approved or cleared by the [place] FDA and has been authorized for detection and/or diagnosis of SARS-CoV-2 by FDA under an Emergency Use Authorization (EUA).  This EUA will remain in effect (meaning this test can be used) for the duration of the COVID-19 declaration under Section 564(b)(1) of the Act, 21 U.S.C. section 360bbb-3(b)(1), unless the authorization is terminated or revoked sooner. Performed at [facility], [place] [address], [place], [place] [address]   Culture, blood (routine x 2)     Status: None (Preliminary result)   Collection Time: 06/26/19  2:46 PM   Specimen: BLOOD  Result Value Ref Range Status   Specimen Description BLOOD RIGHT ANTECUBITAL  Final   Special Requests   Final    BOTTLES DRAWN  AEROBIC AND ANAEROBIC Blood Culture results may not be optimal due to an excessive volume of blood received in culture bottles   Culture   Final    NO GROWTH 4 DAYS Performed at [facility], [place] [address], [place], [place] [address]    Report Status PENDING  Incomplete  Culture, blood (routine x 2)     Status: None (Preliminary result)   Collection Time: 06/26/19  2:52 PM   Specimen: BLOOD LEFT FOREARM  Result Value Ref Range Status   Specimen Description BLOOD LEFT FOREARM  Final   Special Requests   Final    BOTTLES DRAWN AEROBIC AND ANAEROBIC Blood Culture adequate volume   Culture   Final    NO GROWTH 4 DAYS Performed at [facility], [place] [address] [place], [place] [address]    Report Status PENDING  Incomplete     Terri Piedra, [place] [facility] [phone] Pager  06/30/2019  12:49 PM

## 2019-06-30 NOTE — Progress Notes (Signed)
Patient had missed urine occurrence because of an episode of diarrhea with urine in BSC.  RN educated patient on this use of urinal and why accurate output is needed.  Patient stated "it all happened suddenly".  Patient stated he would use urinal in the future.

## 2019-06-30 NOTE — Progress Notes (Signed)
Pre CABG Dopplers completed. Preliminary results in Chart review CV Proc. [place] Jameca Chumley,RVS 06/30/2019 2:28 PM

## 2019-07-01 ENCOUNTER — Encounter (HOSPITAL_COMMUNITY): Payer: Medicare Other

## 2019-07-01 DIAGNOSIS — Z888 Allergy status to other drugs, medicaments and biological substances status: Secondary | ICD-10-CM

## 2019-07-01 DIAGNOSIS — Z794 Long term (current) use of insulin: Secondary | ICD-10-CM

## 2019-07-01 LAB — CBC
HCT: 37.3 % — ABNORMAL LOW (ref 39.0–52.0)
Hemoglobin: 11.9 g/dL — ABNORMAL LOW (ref 13.0–17.0)
MCH: 26 pg (ref 26.0–34.0)
MCHC: 31.9 g/dL (ref 30.0–36.0)
MCV: 81.6 fL (ref 80.0–100.0)
Platelets: 250 10*3/uL (ref 150–400)
RBC: 4.57 MIL/uL (ref 4.22–5.81)
RDW: 17.4 % — ABNORMAL HIGH (ref 11.5–15.5)
WBC: 10.9 10*3/uL — ABNORMAL HIGH (ref 4.0–10.5)
nRBC: 0 % (ref 0.0–0.2)

## 2019-07-01 LAB — GLUCOSE, CAPILLARY
Glucose-Capillary: 67 mg/dL — ABNORMAL LOW (ref 70–99)
Glucose-Capillary: 98 mg/dL (ref 70–99)
Glucose-Capillary: 363 mg/dL — ABNORMAL HIGH (ref 70–99)
Glucose-Capillary: 365 mg/dL — ABNORMAL HIGH (ref 70–99)
Glucose-Capillary: 343 mg/dL — ABNORMAL HIGH (ref 70–99)

## 2019-07-01 LAB — BASIC METABOLIC PANEL
Anion gap: 10 (ref 5–15)
BUN: 48 mg/dL — ABNORMAL HIGH (ref 8–23)
CO2: 24 mmol/L (ref 22–32)
Calcium: 8.8 mg/dL — ABNORMAL LOW (ref 8.9–10.3)
Chloride: 104 mmol/L (ref 98–111)
Creatinine, Ser: 1.56 mg/dL — ABNORMAL HIGH (ref 0.61–1.24)
GFR calc Af Amer: 51 mL/min — ABNORMAL LOW (ref >60)
GFR calc non Af Amer: 44 mL/min — ABNORMAL LOW (ref >60)
Glucose, Bld: 75 mg/dL (ref 70–99)
Potassium: 3.5 mmol/L (ref 3.5–5.1)
Sodium: 138 mmol/L (ref 135–145)

## 2019-07-01 LAB — CULTURE, BLOOD (ROUTINE X 2)
Culture: NO GROWTH
Culture: NO GROWTH
Special Requests: ADEQUATE

## 2019-07-01 LAB — PROTIME-INR
INR: 1.4 — ABNORMAL HIGH (ref 0.8–1.2)
Prothrombin Time: 16.6 seconds — ABNORMAL HIGH (ref 11.4–15.2)

## 2019-07-01 LAB — HEPARIN LEVEL (UNFRACTIONATED): Heparin Unfractionated: 0.52 IU/mL (ref 0.30–0.70)

## 2019-07-01 LAB — DIGOXIN LEVEL: Digoxin Level: 0.3 ng/mL — ABNORMAL LOW (ref 0.8–2.0)

## 2019-07-01 MED ORDER — INSULIN GLARGINE 100 UNIT/ML ~~LOC~~ SOLN
25.0000 [IU] | Freq: Two times a day (BID) | SUBCUTANEOUS | Status: DC
  Administered 2019-07-01 – 2019-07-02 (×3): 25 [IU] via SUBCUTANEOUS
  Filled 2019-07-01 (×4): qty 0.25

## 2019-07-01 MED ORDER — POTASSIUM CHLORIDE CRYS ER 20 MEQ PO TBCR
40.0000 meq | EXTENDED_RELEASE_TABLET | Freq: Once | ORAL | Status: AC
  Administered 2019-07-01: 40 meq via ORAL
  Filled 2019-07-01: qty 2

## 2019-07-01 MED ORDER — ENSURE MAX PROTEIN PO LIQD
11.0000 [oz_av] | Freq: Every day | ORAL | Status: DC
  Administered 2019-07-01 – 2019-07-10 (×9): 11 [oz_av] via ORAL
  Filled 2019-07-01 (×15): qty 330

## 2019-07-01 NOTE — Progress Notes (Signed)
Results for [name], [name] (MRN [id number]) as of 07/01/2019 13:38  Ref. Range 06/30/2019 21:40 07/01/2019 06:06 07/01/2019 06:32 07/01/2019 11:28  Glucose-Capillary Latest Ref Range: 70 - 99 mg/dL 242 (H) 67 (L) 98 363 (H)  Received diabetes coordinator consult in regards to patient's blood sugars.  Noted that patient had a low CBG of 67 mg/dl at 0600 this am. CBG 25 minutes later was 98 mg/dl.  Patient ate 100% of meal. Since patient's CBG was low, no meal coverage was given. Also consumed a nutritional supplement following breakfast. CBG at 1128 was 363 mg/dl.   Spoke with staff RN in regards to patient's blood sugars. Stated that he received Novolog 15 units for CBG of 363 mg/dl and the Novolog 10 units meal coverage. He also received Lantus 34 units this am since new insulin order for Lantus 25 units BID had not been entered.   Will continue to monitor blood sugars prior to his surgery.   Harvel Ricks RN BSN CDE Diabetes Coordinator Pager: [phone]  8am-5pm

## 2019-07-01 NOTE — Consult Note (Signed)
   [facility] Inpatient Consult   07/01/2019  Joseph Hoffman [birth year] [phone]  Patient is currently active with [facility] for chronic disease management services.  Patient has been engaged by a [facility] Management Coordinator.   Our community based plan of care has focused on disease management and community resource support.   Chart review reveals patient as follows from MD progress notes 06/29/2019:  Joseph Hoffman is a 68 year old with history of permanent atrial fibrillation, chronic combined CHF, mechanical AVR in 2013, HTN, HLD, OSA on CPAP, DM type 2, CKD stage 3, and hepatitis.   Patient will receive a post hospital call and will be evaluated for assessments and disease process education.    Plan:  Follow up with [facility] team member to make aware that [facility] following. Of note, [facility] Care Management services does not replace or interfere with any services that are needed or arranged by inpatient [facility] care management team.  For additional questions or referrals please contact:  Natividad Brood, RN BSN [facility] Liaison  [phone] business mobile phone Toll free office [phone]  Fax number: [phone] [name]

## 2019-07-01 NOTE — Plan of Care (Signed)
  Problem: Education: Goal: Ability to demonstrate management of disease process will improve Outcome: Progressing Goal: Ability to verbalize understanding of medication therapies will improve Outcome: Progressing Goal: Individualized Educational Video(s) Outcome: Progressing   Problem: Activity: Goal: Capacity to carry out activities will improve Outcome: Progressing   Problem: Cardiac: Goal: Ability to achieve and maintain adequate cardiopulmonary perfusion will improve Outcome: Progressing   Problem: Education: Goal: Knowledge of General Education information will improve Description: Including pain rating scale, medication(s)/side effects and non-pharmacologic comfort measures Outcome: Progressing   Problem: Health Behavior/Discharge Planning: Goal: Ability to manage health-related needs will improve Outcome: Progressing   Problem: Clinical Measurements: Goal: Ability to maintain clinical measurements within normal limits will improve Outcome: Progressing Goal: Will remain free from infection Outcome: Progressing Goal: Diagnostic test results will improve Outcome: Progressing Goal: Respiratory complications will improve Outcome: Progressing Goal: Cardiovascular complication will be avoided Outcome: Progressing   Problem: Activity: Goal: Risk for activity intolerance will decrease Outcome: Progressing   Problem: Nutrition: Goal: Adequate nutrition will be maintained Outcome: Progressing   Problem: Elimination: Goal: Will not experience complications related to bowel motility Outcome: Progressing Goal: Will not experience complications related to urinary retention Outcome: Progressing   Problem: Pain Managment: Goal: General experience of comfort will improve Outcome: Progressing

## 2019-07-01 NOTE — Progress Notes (Addendum)
Progress Note  Patient Name: Joseph Hoffman Date of Encounter: 07/01/2019  Primary Cardiologist: Dr. Claiborne Billings  Subjective   No chest pain; breathing better  Inpatient Medications    Scheduled Meds:  cholecalciferol  1,000 Units Oral Daily   digoxin  0.125 mg Oral QHS   escitalopram  10 mg Oral QPM   ezetimibe  10 mg Oral Daily   feeding supplement  1 Container Oral TID BM   fenofibrate  160 mg Oral Daily   hydrALAZINE  12.5 mg Oral BID   Icosapent Ethyl  2 g Oral BID   insulin aspart  0-15 Units Subcutaneous TID WC   insulin aspart  0-5 Units Subcutaneous QHS   insulin aspart  10 Units Subcutaneous TID WC   insulin glargine  34 Units Subcutaneous BID   isosorbide mononitrate  30 mg Oral Daily   metoprolol succinate  37.5 mg Oral BID   mometasone-formoterol  2 puff Inhalation BID   multivitamin  1 tablet Oral Daily   nutrition supplement (JUVEN)  1 packet Oral BID BM   saccharomyces boulardii  250 mg Oral Q M,W,F   senna-docusate  2 tablet Oral BID   sodium chloride flush  3 mL Intravenous Q12H   vitamin C  1,000 mg Oral QPM   Continuous Infusions:  sodium chloride 50 mL/hr at 06/30/19 0635   sodium chloride 250 mL (07/01/19 0634)   cefTRIAXone (ROCEPHIN)  IV 2 g (06/30/19 1600)   heparin 1,750 Units/hr (07/01/19 0114)   vancomycin 1,000 mg (06/30/19 1929)   PRN Meds: sodium chloride, acetaminophen **OR** acetaminophen, albuterol, guaiFENesin-dextromethorphan, HYDROmorphone (DILAUDID) injection, loperamide, nitroGLYCERIN, oxyCODONE, sodium chloride flush   Vital Signs    Vitals:   07/01/19 0528 07/01/19 0533 07/01/19 0707 07/01/19 0818  BP: 132/69   (!) 116/53  Pulse: (!) 55  60 62  Resp: 20  18 20   Temp: 97.6 F (36.4 C)   98.2 F (36.8 C)  TempSrc: Oral   Oral  SpO2: 100%  100% 95%  Weight:  115.9 kg    Height:        Intake/Output Summary (Last 24 hours) at 07/01/2019 1058 Last data filed at 07/01/2019 1050 Gross per 24 hour    Intake 2225.83 ml  Output 2726 ml  Net -500.17 ml    I/O since admission:   Filed Weights   06/29/19 0610 06/30/19 0425 07/01/19 0533  Weight: 114.2 kg 115.9 kg 115.9 kg    Telemetry    AA Fib in the 80s; rare isolated PVC- Personally Reviewed  ECG    ECG (independently read by me):  Physical Exam   BP (!) 116/53 (BP Location: Left Arm)    Pulse 62    Temp 98.2 F (36.8 C) (Oral)    Resp 20    Ht 5\' 11"  (1.803 m)    Wt 115.9 kg    SpO2 95%    BMI 35.65 kg/m  General: Alert, oriented, no distress.  Skin: normal turgor, no rashes, warm and dry HEENT: Normocephalic, atraumatic. Pupils equal round and reactive to light; sclera anicteric; extraocular muscles intact;  Nose without nasal septal hypertrophy Mouth/Parynx benign; Mallinpatti scale 3 Neck: No JVD, no carotid bruits; normal carotid upstroke Lungs: clear to ausculatation and percussion; no wheezing or rales Chest wall: without tenderness to palpitation Heart: PMI not displaced, irregular irregular, s1 s2 normal, 1/6 systolic murmur, 2/6 diastolic murmur, no rubs, gallops, thrills, or heaves Abdomen: soft, nontender; no hepatosplenomehaly, BS+; abdominal aorta nontender  and not dilated by palpation. Back: no CVA tenderness Pulses 2+ Musculoskeletal: full range of motion, normal strength, no joint deformities Extremities: venous stasis changes; edema improved, 1+ L ankle; gangrenous plantar aspect of left great toe Neurologic: grossly nonfocal; Cranial nerves grossly wnl Psychologic: Normal mood and affect   Labs    Chemistry Recent Labs  Lab 06/29/19 0311  06/29/19 1050 06/30/19 0551 07/01/19 0515  NA 133*   < > 136 129* 138  K 3.6   < > 3.5 4.1 3.5  CL 97*  --   --  97* 104  CO2 21*  --   --  22 24  GLUCOSE 98  --   --  353* 75  BUN 57*  --   --  52* 48*  CREATININE 1.96*  --   --  1.37* 1.56*  CALCIUM 9.1  --   --  8.5* 8.8*  GFRNONAA 34*  --   --  52* 44*  GFRAA 39*  --   --  >60 51*  ANIONGAP 15   --   --  10 10   < > = values in this interval not displayed.     Hematology Recent Labs  Lab 06/29/19 0311  06/29/19 1050 06/30/19 0551 07/01/19 0515  WBC 12.2*  --   --  10.0 10.9*  RBC 4.91  --   --  4.41 4.57  HGB 12.8*   < > 12.2* 11.3* 11.9*  HCT 39.9   < > 36.0* 35.7* 37.3*  MCV 81.3  --   --  81.0 81.6  MCH 26.1  --   --  25.6* 26.0  MCHC 32.1  --   --  31.7 31.9  RDW 17.2*  --   --  16.9* 17.4*  PLT 253  --   --  229 250   < > = values in this interval not displayed.    Cardiac EnzymesNo results for input(s): TROPONINI in the last 168 hours. No results for input(s): TROPIPOC in the last 168 hours.   BNP Recent Labs  Lab 06/26/19 1315  BNP 669.2*     DDimer No results for input(s): DDIMER in the last 168 hours.   Lipid Panel     Component Value Date/Time   CHOL 111 05/13/2019 0422   CHOL 156 02/05/2019 1006   CHOL 166 08/23/2014 0853   TRIG 89 05/13/2019 0422   TRIG 274 (H) 08/23/2014 0853   HDL 19 (L) 05/13/2019 0422   HDL 23 (L) 02/05/2019 1006   HDL 26 (L) 08/23/2014 0853   CHOLHDL 5.8 05/13/2019 0422   VLDL 18 05/13/2019 0422   LDLCALC 74 05/13/2019 0422   LDLCALC 84 02/05/2019 1006   LDLCALC 85 08/23/2014 0853     Radiology    Dg Foot Complete Left  Result Date: 06/30/2019 CLINICAL DATA:  Osteomyelitis of left great toe and foot. Hx of diabetes, CHF, arthritis. EXAM: LEFT FOOT - COMPLETE 3+ VIEW COMPARISON:  None. FINDINGS: Soft tissue ulceration noted along the medial margin of the great toe at and proximal to the level of the IP joint. No bone resorption or focal osteopenia to suggest osteomyelitis. No fractures.  No dislocation. There is narrowing of the interphalangeal joints consistent with mild osteoarthritis. Mild degenerative spurring is noted along the dorsal aspect the intertarsal articulations. Ossification is noted along plantar fascia and there are calcifications along the arteries of the ankle and foot. IMPRESSION: 1. No fracture. 2.  No radiographic evidence of osteomyelitis.  3. Also along the medial margin of the great toe. Electronically Signed   By: Lajean Manes M.D.   On: 06/30/2019 19:21   Vas US Doppler Pre Cabg  Result Date: 06/30/2019 PREOPERATIVE VASCULAR EVALUATION  Indications:  Pre CABG. Ulceration. Risk Factors: Hypertension, hyperlipidemia, Diabetes, coronary artery disease,               PAD. Limitations:  Cardiac arrythmia, Respiratory interference Performing Technologist: Birdena Crandall, [place] RVS Supporting Technologist: June Leap RDMS, RVT  Examination Guidelines: A complete evaluation includes B-mode imaging, spectral Doppler, color Doppler, and power Doppler as needed of all accessible portions of each vessel. Bilateral testing is considered an integral part of a complete examination. Limited examinations for reoccurring indications may be performed as noted.  Right Carotid Findings: +----------+--------+-------+--------+------------+----------------------------+             PSV cm/s EDV     Stenosis Describe     Comments                                           cm/s                                                        +----------+--------+-------+--------+------------+----------------------------+  CCA Prox   110      1                             mild intimal wall changes     +----------+--------+-------+--------+------------+----------------------------+  CCA Distal 110      1                             mild intimal wall changes     +----------+--------+-------+--------+------------+----------------------------+  ICA Prox   90       11      1-39%    heterogenous mild plaque                   +----------+--------+-------+--------+------------+----------------------------+  ICA Mid    46       9                             tortuous                      +----------+--------+-------+--------+------------+----------------------------+  ICA Distal 84       24                            tortuous                       +----------+--------+-------+--------+------------+----------------------------+  ECA        205      4                heterogenous moderate plaque at the  origin                        +----------+--------+-------+--------+------------+----------------------------+ Portions of this table do not appear on this page. +----------+--------+-------+--------+------------+             PSV cm/s EDV cms Describe Arm Pressure  +----------+--------+-------+--------+------------+  Subclavian 94                        158           +----------+--------+-------+--------+------------+ +---------+--------+--+--------+-+  Vertebral PSV cm/s 53 EDV cm/s 5  +---------+--------+--+--------+-+ Left Carotid Findings: +----------+--------+--------+--------+--------+------------------------------+             PSV cm/s EDV cm/s Stenosis Describe Comments                        +----------+--------+--------+--------+--------+------------------------------+  CCA Prox   80       1                          mild intimal wall changes       +----------+--------+--------+--------+--------+------------------------------+  CCA Distal 96       2                          mild intimal wall changes       +----------+--------+--------+--------+--------+------------------------------+  ICA Prox                     Occluded          plaque with acoustic shadowing  +----------+--------+--------+--------+--------+------------------------------+  ICA Mid                                        no flow noted                   +----------+--------+--------+--------+--------+------------------------------+  ICA Distal                                     no flow noted                   +----------+--------+--------+--------+--------+------------------------------+  ECA        126                                 difficult to image               +----------+--------+--------+--------+--------+------------------------------+ +----------+--------+--------+--------+------------+  Subclavian PSV cm/s EDV cm/s Describe Arm Pressure  +----------+--------+--------+--------+------------+             139                        132           +----------+--------+--------+--------+------------+ Reversed flow just distal to the bulb occluded ICA ABI Findings: +--------+------------------+-----+---------+--------+  Right    Rt Pressure (mmHg) Index Waveform  Comment   +--------+------------------+-----+---------+--------+  Brachial 158                      triphasic           +--------+------------------+-----+---------+--------+ +--------+------------------+-----+--------+----------------+  Left  Lt Pressure (mmHg) Index Waveform Comment           +--------+------------------+-----+--------+----------------+  Brachial 132                      biphasic sounds triphasic  +--------+------------------+-----+--------+----------------+ +-------+---------------+----------------+  ABI/TBI Today's ABI/TBI Previous ABI/TBI  +-------+---------------+----------------+  Right                   Rifle/0.37           +-------+---------------+----------------+  Left                    0.85./0.73        +-------+---------------+----------------+ ABI/TBI exam completed 06/26/2019.  Right Doppler Findings: +-----------+--------+-----+---------+--------+  Site        Pressure Index Doppler   Comments  +-----------+--------+-----+---------+--------+  Brachial    158            triphasic           +-----------+--------+-----+---------+--------+  Radial                     triphasic           +-----------+--------+-----+---------+--------+  Ulnar                      triphasic           +-----------+--------+-----+---------+--------+  Palmar Arch                          normal    +-----------+--------+-----+---------+--------+  Left Doppler Findings:  +-----------+--------+-----+---------+----------------+  Site        Pressure Index Doppler   Comments          +-----------+--------+-----+---------+----------------+  Brachial    132            biphasic  sounds triphasic  +-----------+--------+-----+---------+----------------+  Radial                     triphasic                   +-----------+--------+-----+---------+----------------+  Ulnar                      triphasic                   +-----------+--------+-----+---------+----------------+  Palmar Arch                          normal            +-----------+--------+-----+---------+----------------+  Summary: Right Carotid: Velocities in the right ICA are consistent with a 1-39% stenosis. 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 Vertebrals:  Bilateral vertebral arteries demonstrate antegrade flow. Subclavians: Normal flow hemodynamics were seen in bilateral subclavian              arteries. Right ABI: Resting right ankle-brachial index indicates noncompressible right lower extremity arteries.The right toe-brachial index is abnormal. Left ABI: Resting left ankle-brachial index indicates mild left lower extremity arterial disease. The left toe-brachial index is abnormal. ABI may be falsely elevated. Right Upper Extremity: Doppler waveforms remain within normal limits with right radial compression. Doppler waveforms remain within normal limits with right ulnar compression. Left Upper Extremity: Doppler waveforms remain within normal limits with left radial compression. Doppler waveforms remain within normal limits with left ulnar compression.  Electronically signed by Deitra Mayo MD on 06/30/2019 at 5:55:59 PM.    Final    Vas [place] Lower Extremity Arterial Duplex  Result Date: 06/30/2019 LOWER EXTREMITY ARTERIAL DUPLEX STUDY Indications: Ulceration, and peripheral artery disease. High Risk Factors: Hypertension, hyperlipidemia, Diabetes. Other Factors: Permanent  A-Fib,KD stage 3, Chronic combimed CHF,.  Current ABI: 06/26/2019 RT [place] 0.85 Limitations: Constant drawing of the leg when imaging the lower leg, Cardiac              arrythmia Comparison Study: No imaging comparison available only ABI/TBI exam of                   06/26/2019 Performing Technologist: Toma Copier RVS  Examination Guidelines: A complete evaluation includes B-mode imaging, spectral Doppler, color Doppler, and power Doppler as needed of all accessible portions of each vessel. Bilateral testing is considered an integral part of a complete examination. Limited examinations for reoccurring indications may be performed as noted.  +--------+--------+-----+--------+--------+--------------+  RIGHT    PSV cm/s Ratio Stenosis Waveform Comments        +--------+--------+-----+--------+--------+--------------+  CFA Prox 86                      biphasic minimal plaque  +--------+--------+-----+--------+--------+--------------+  +----------+--------+-----+--------+---------+--------------+  LEFT       PSV cm/s Ratio Stenosis Waveform  Comments        +----------+--------+-----+--------+---------+--------------+  CFA Prox   105                     biphasic                  +----------+--------+-----+--------+---------+--------------+  CFA Distal 145                     biphasic  minimal plaque  +----------+--------+-----+--------+---------+--------------+  DFA        181                     biphasic  mild plaque     +----------+--------+-----+--------+---------+--------------+  SFA Prox   164                     biphasic  mild plaque     +----------+--------+-----+--------+---------+--------------+  SFA Mid    120                     biphasic  mild plaque     +----------+--------+-----+--------+---------+--------------+  SFA Distal 125                     triphasic mild plaque     +----------+--------+-----+--------+---------+--------------+  POP Prox   108                     biphasic  miild plaque     +----------+--------+-----+--------+---------+--------------+  POP Distal 83                      biphasic  mild plaque     +----------+--------+-----+--------+---------+--------------+  ATA Distal 29                                                +----------+--------+-----+--------+---------+--------------+  PTA Mid    56  biphasic  mild plaque     +----------+--------+-----+--------+---------+--------------+  PTA Distal 148                     biphasic  mild plaque     +----------+--------+-----+--------+---------+--------------+ Technically difficult to evaluate due to cardiac arrythmia and constant drawing of the lower leg. There is mild irregular plaque throughout. Unable to note a significant stenosis throughout.  Summary: See table(s) above for measurements and observations. Electronically signed by Deitra Mayo MD on 06/30/2019 at 5:54:26 PM.    Final     Cardiac Studies   TEE IMPRESSIONS: 05/29/2019  1. The left ventricle has moderate-severely reduced systolic function, with an ejection fraction of 30-35%. Left ventricular diffuse hypokinesis.  2. The right ventricle has severely reduced systolic function. The cavity was mildly enlarged.  3. Left atrial size was severely dilated.  4. Right atrial size was severely dilated.  5. The mitral valve is grossly normal. No evidence of mitral valve stenosis.  6. The tricuspid valve was grossly normal.  7. Aortic valve regurgitation is severe by color flow Doppler.  8. There is evidence of mild plaque in the descending aorta.  9. Moderate to severe global reduction in LV systolic function; mild RVE with severe RV dysfunction; severe biatrial enlargement; s/p mechanical AVR with mean gradient of 14 mmHg (both leaflets appear to be mobile); severe perivalvular AI; mild MR and  TR.   R/L CATH Conclusions: 06/29/2019 1. Multivessel coronary artery disease, including 50% mid LAD, 80% large OM2, 50% mid RCA, and 80-90% ostial rPDA  stenoses. 2. Mildly to moderately elevated left and right heart filling pressures. 3. Mildly reduced Fick cardiac output/index.  Recommendations: 1. Gentle post-cath hydration for goal net even fluid balance today. Consider gentle diuresis, as renal function tolerates, beginning tomorrow. 2. Ongoing workup for redo aortic valve replacement +/- CABG per Drs. Claiborne Billings and [place]. 3. Initiate heparin infusion 2 hours after TR band removal. 4. Aggressive secondary prevention.   Right Heart Pressures RA (mean): 12 mmHg RV (S/EDP): 55/14 mmHg PA (S/D, mean): 54/25 (34) mmHg PCWP (mean): 25 mmHg  Ao sat: 97% PA sat: 62%  Fick CO: 5.1 L/min Fick CI: 2.2 L/min/m^2    Intervention   Patient Profile     Jonhatan Hearty Gubelliis a 68 y.o.malewith a PMH of permanent atrial fibrillation, chronic combined CHF, St[employer] Jude AVR in 2013, HTN, HLD, OSA on CPAP, DM type 2, CKD stage 3 who has recently was found to have a decline in LV fxn with significant AVR perivalvular leak and recent nonhealing foot ulcer with development of gangrenous plantar aspect of left great toe.  Assessment & Plan    1. Acute on chronic biventricular combined CHF:  EF decline from 04/2018 with EF 50 - 55%; May 2020 40 - 45%, and TEE 05/29/2019 EF 30 - 35%, contributed by progressive perivalvular AR, severe on TEE. R heart pressure elevated, severe RV dysfunction. Lungs clear today. Currently on  toprol XL 37.5 mg bid, hydralazine 12.5 mg bid/ imdur 30 mg and digoxin. As per Dr. Jeffie Pollock will likely need milrinone for pre-op RV support but AVR delayed due to gangrenous toe/infection.  2. L first toe gangrenous changes/cellulitis. LE  Doppler suboptimal but no high grade stenosis identified; No osteomyelitis on X rays; currently on Vanc and Rocephin;  plan for PV angiogram tomorrow with possible intervention and ?L great toe amputation if needed    3. Severe periprosthetic AR: St Jude AVR; AR has progressed since  2019. Unlikely to be  endocarditis.   4. CAD/Chest pain: cath demonstrated CAD with tightest lesions in OM and PDA.  AVR with +/- CABG on hold in light of need to clear infection.  5. Permanent AF: rate controlled, currently 80. Bradycardia previously noted nocturnally. Currently off warfarin on heparin for anticoagulation.  6. OSA on CPAP with supplemental O2 nocturnally  7. CKD: stage 3b to 4. Cr 1.54 today  8. Type 2 DM: on insulin; previously had been on Jardiance and Victoza  9. Mixed hyperlipidemia: on Vascepa, fenofibrate, zetia; intolerant to statins  Signed, Troy Sine, MD, [facility] 07/01/2019, 10:58 AM

## 2019-07-01 NOTE — Progress Notes (Signed)
Advanced Heart Failure Rounding Note   Subjective:    Feels ok today. Denies SOB, orthopnea or PND. Diuresed well on IV lasix but weight unchanged.   Denies CP. Afebrile. Remains on broad spectrum abcx. Renal function stable   Objective:   Weight Range:  Vital Signs:   Temp:  [97.6 F (36.4 C)-98.2 F (36.8 C)] 97.6 F (36.4 C) (07/29 1315) Pulse Rate:  [55-91] 88 (07/29 1315) Resp:  [16-20] 20 (07/29 1315) BP: (113-138)/(53-98) 113/98 (07/29 1315) SpO2:  [95 %-100 %] 97 % (07/29 1315) Weight:  [115.9 kg] 115.9 kg (07/29 0533) Last BM Date: 06/30/19  Weight change: Filed Weights   06/29/19 0610 06/30/19 0425 07/01/19 0533  Weight: 114.2 kg 115.9 kg 115.9 kg    Intake/Output:   Intake/Output Summary (Last 24 hours) at 07/01/2019 1605 Last data filed at 07/01/2019 1519 Gross per 24 hour  Intake 2212.51 ml  Output 3151 ml  Net -938.49 ml     Physical Exam: General:  Sitting up in bed No resp difficulty HEENT: normal Neck: supple. JVP 9. Carotids 2+ bilat; no bruits. No lymphadenopathy or thryomegaly appreciated. Cor: PMI nondisplaced. Irregular rate & rhythm. No rubs, gallops or murmurs. Lungs: clear Abdomen: soft, nontender, nondistended. No hepatosplenomegaly. No bruits or masses. Good bowel sounds. Extremities: no cyanosis, clubbing, rash, edema  L great toe eschar. Improving cellulitis  Neuro: alert & orientedx3, cranial nerves grossly intact. moves all 4 extremities w/o difficulty. Affect pleasant   Telemetry:  AF 70-80sPersonally reviewed   Labs: Basic Metabolic Panel: Recent Labs  Lab 06/27/19 0415 06/28/19 0602 06/29/19 0311 06/29/19 1044 06/29/19 1050 06/30/19 0551 07/01/19 0515  NA 135 134* 133* 136 136 129* 138  K 3.9 4.2 3.6 3.6 3.5 4.1 3.5  CL 96* 97* 97*  --   --  97* 104  CO2 25 24 21*  --   --  22 24  GLUCOSE 170* 140* 98  --   --  353* 75  BUN 45* 45* 57*  --   --  52* 48*  CREATININE 2.19* 1.92* 1.96*  --   --  1.37* 1.56*    CALCIUM 9.2 9.0 9.1  --   --  8.5* 8.8*    Liver Function Tests: No results for input(s): AST, ALT, ALKPHOS, BILITOT, PROT, ALBUMIN in the last 168 hours. No results for input(s): LIPASE, AMYLASE in the last 168 hours. No results for input(s): AMMONIA in the last 168 hours.  CBC: Recent Labs  Lab 06/27/19 0415 06/28/19 0602 06/29/19 0311 06/29/19 1044 06/29/19 1050 06/30/19 0551 07/01/19 0515  WBC 12.4* 11.1* 12.2*  --   --  10.0 10.9*  NEUTROABS  --  7.8*  --   --   --   --   --   HGB 13.2 12.5* 12.8* 12.6* 12.2* 11.3* 11.9*  HCT 43.1 40.6 39.9 37.0* 36.0* 35.7* 37.3*  MCV 83.9 83.7 81.3  --   --  81.0 81.6  PLT 266 237 253  --   --  229 250    Cardiac Enzymes: No results for input(s): CKTOTAL, CKMB, CKMBINDEX, TROPONINI in the last 168 hours.  BNP: BNP (last 3 results) Recent Labs    05/12/19 1426 05/28/19 0617 06/26/19 1315  BNP 297.0* 342.8* 669.2*    ProBNP (last 3 results) No results for input(s): PROBNP in the last 8760 hours.    Other results:  Imaging: Dg Foot Complete Left  Result Date: 06/30/2019 CLINICAL DATA:  Osteomyelitis of left  great toe and foot. Hx of diabetes, CHF, arthritis. EXAM: LEFT FOOT - COMPLETE 3+ VIEW COMPARISON:  None. FINDINGS: Soft tissue ulceration noted along the medial margin of the great toe at and proximal to the level of the IP joint. No bone resorption or focal osteopenia to suggest osteomyelitis. No fractures.  No dislocation. There is narrowing of the interphalangeal joints consistent with mild osteoarthritis. Mild degenerative spurring is noted along the dorsal aspect the intertarsal articulations. Ossification is noted along plantar fascia and there are calcifications along the arteries of the ankle and foot. IMPRESSION: 1. No fracture. 2. No radiographic evidence of osteomyelitis. 3. Also along the medial margin of the great toe. Electronically Signed   By: Lajean Manes M.D.   On: 06/30/2019 19:21   Vas US Doppler Pre  Cabg  Result Date: 06/30/2019 PREOPERATIVE VASCULAR EVALUATION  Indications:  Pre CABG. Ulceration. Risk Factors: Hypertension, hyperlipidemia, Diabetes, coronary artery disease,               PAD. Limitations:  Cardiac arrythmia, Respiratory interference Performing Technologist: Birdena Crandall, [place] RVS Supporting Technologist: June Leap RDMS, RVT  Examination Guidelines: A complete evaluation includes B-mode imaging, spectral Doppler, color Doppler, and power Doppler as needed of all accessible portions of each vessel. Bilateral testing is considered an integral part of a complete examination. Limited examinations for reoccurring indications may be performed as noted.  Right Carotid Findings: +----------+--------+-------+--------+------------+----------------------------+             PSV cm/s EDV     Stenosis Describe     Comments                                           cm/s                                                        +----------+--------+-------+--------+------------+----------------------------+  CCA Prox   110      1                             mild intimal wall changes     +----------+--------+-------+--------+------------+----------------------------+  CCA Distal 110      1                             mild intimal wall changes     +----------+--------+-------+--------+------------+----------------------------+  ICA Prox   90       11      1-39%    heterogenous mild plaque                   +----------+--------+-------+--------+------------+----------------------------+  ICA Mid    46       9                             tortuous                      +----------+--------+-------+--------+------------+----------------------------+  ICA Distal 84       24  tortuous                      +----------+--------+-------+--------+------------+----------------------------+  ECA        205      4                heterogenous moderate plaque at the                                                            origin                        +----------+--------+-------+--------+------------+----------------------------+ Portions of this table do not appear on this page. +----------+--------+-------+--------+------------+             PSV cm/s EDV cms Describe Arm Pressure  +----------+--------+-------+--------+------------+  Subclavian 94                        158           +----------+--------+-------+--------+------------+ +---------+--------+--+--------+-+  Vertebral PSV cm/s 53 EDV cm/s 5  +---------+--------+--+--------+-+ Left Carotid Findings: +----------+--------+--------+--------+--------+------------------------------+             PSV cm/s EDV cm/s Stenosis Describe Comments                        +----------+--------+--------+--------+--------+------------------------------+  CCA Prox   80       1                          mild intimal wall changes       +----------+--------+--------+--------+--------+------------------------------+  CCA Distal 96       2                          mild intimal wall changes       +----------+--------+--------+--------+--------+------------------------------+  ICA Prox                     Occluded          plaque with acoustic shadowing  +----------+--------+--------+--------+--------+------------------------------+  ICA Mid                                        no flow noted                   +----------+--------+--------+--------+--------+------------------------------+  ICA Distal                                     no flow noted                   +----------+--------+--------+--------+--------+------------------------------+  ECA        126                                 difficult to image              +----------+--------+--------+--------+--------+------------------------------+ +----------+--------+--------+--------+------------+  Subclavian PSV cm/s EDV cm/s Describe Arm Pressure  +----------+--------+--------+--------+------------+  139                         132           +----------+--------+--------+--------+------------+ Reversed flow just distal to the bulb occluded ICA ABI Findings: +--------+------------------+-----+---------+--------+  Right    Rt Pressure (mmHg) Index Waveform  Comment   +--------+------------------+-----+---------+--------+  Brachial 158                      triphasic           +--------+------------------+-----+---------+--------+ +--------+------------------+-----+--------+----------------+  Left     Lt Pressure (mmHg) Index Waveform Comment           +--------+------------------+-----+--------+----------------+  Brachial 132                      biphasic sounds triphasic  +--------+------------------+-----+--------+----------------+ +-------+---------------+----------------+  ABI/TBI Today's ABI/TBI Previous ABI/TBI  +-------+---------------+----------------+  Right                   [place]/0.37           +-------+---------------+----------------+  Left                    0.85./0.73        +-------+---------------+----------------+ ABI/TBI exam completed 06/26/2019.  Right Doppler Findings: +-----------+--------+-----+---------+--------+  Site        Pressure Index Doppler   Comments  +-----------+--------+-----+---------+--------+  Brachial    158            triphasic           +-----------+--------+-----+---------+--------+  Radial                     triphasic           +-----------+--------+-----+---------+--------+  Ulnar                      triphasic           +-----------+--------+-----+---------+--------+  Palmar Arch                          normal    +-----------+--------+-----+---------+--------+  Left Doppler Findings: +-----------+--------+-----+---------+----------------+  Site        Pressure Index Doppler   Comments          +-----------+--------+-----+---------+----------------+  Brachial    132            biphasic  sounds triphasic  +-----------+--------+-----+---------+----------------+  Radial                      triphasic                   +-----------+--------+-----+---------+----------------+  Ulnar                      triphasic                   +-----------+--------+-----+---------+----------------+  Palmar Arch                          normal            +-----------+--------+-----+---------+----------------+  Summary: Right Carotid: Velocities in the right ICA are consistent with a 1-39% stenosis. 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 Vertebrals:  Bilateral vertebral arteries demonstrate antegrade flow. Subclavians: Normal flow hemodynamics  were seen in bilateral subclavian              arteries. Right ABI: Resting right ankle-brachial index indicates noncompressible right lower extremity arteries.The right toe-brachial index is abnormal. Left ABI: Resting left ankle-brachial index indicates mild left lower extremity arterial disease. The left toe-brachial index is abnormal. ABI may be falsely elevated. Right Upper Extremity: Doppler waveforms remain within normal limits with right radial compression. Doppler waveforms remain within normal limits with right ulnar compression. Left Upper Extremity: Doppler waveforms remain within normal limits with left radial compression. Doppler waveforms remain within normal limits with left ulnar compression.  Electronically signed by Deitra Mayo MD on 06/30/2019 at 5:55:59 PM.    Final    Vas [place] Lower Extremity Arterial Duplex  Result Date: 06/30/2019 LOWER EXTREMITY ARTERIAL DUPLEX STUDY Indications: Ulceration, and peripheral artery disease. High Risk Factors: Hypertension, hyperlipidemia, Diabetes. Other Factors: Permanent A-Fib,KD stage 3, Chronic combimed CHF,.  Current ABI: 06/26/2019 RT [place] Lt 0.85 Limitations: Constant drawing of the leg when imaging the lower leg, Cardiac              arrythmia Comparison Study: No imaging comparison available only ABI/TBI exam of                   06/26/2019 Performing Technologist:  Toma Copier RVS  Examination Guidelines: A complete evaluation includes B-mode imaging, spectral Doppler, color Doppler, and power Doppler as needed of all accessible portions of each vessel. Bilateral testing is considered an integral part of a complete examination. Limited examinations for reoccurring indications may be performed as noted.  +--------+--------+-----+--------+--------+--------------+  RIGHT    PSV cm/s Ratio Stenosis Waveform Comments        +--------+--------+-----+--------+--------+--------------+  CFA Prox 86                      biphasic minimal plaque  +--------+--------+-----+--------+--------+--------------+  +----------+--------+-----+--------+---------+--------------+  LEFT       PSV cm/s Ratio Stenosis Waveform  Comments        +----------+--------+-----+--------+---------+--------------+  CFA Prox   105                     biphasic                  +----------+--------+-----+--------+---------+--------------+  CFA Distal 145                     biphasic  minimal plaque  +----------+--------+-----+--------+---------+--------------+  DFA        181                     biphasic  mild plaque     +----------+--------+-----+--------+---------+--------------+  SFA Prox   164                     biphasic  mild plaque     +----------+--------+-----+--------+---------+--------------+  SFA Mid    120                     biphasic  mild plaque     +----------+--------+-----+--------+---------+--------------+  SFA Distal 125                     triphasic mild plaque     +----------+--------+-----+--------+---------+--------------+  POP Prox   108                     biphasic  miild plaque    +----------+--------+-----+--------+---------+--------------+  POP Distal 83                      biphasic  mild plaque     +----------+--------+-----+--------+---------+--------------+  ATA Distal 29                                                +----------+--------+-----+--------+---------+--------------+   PTA Mid    56                      biphasic  mild plaque     +----------+--------+-----+--------+---------+--------------+  PTA Distal 148                     biphasic  mild plaque     +----------+--------+-----+--------+---------+--------------+ Technically difficult to evaluate due to cardiac arrythmia and constant drawing of the lower leg. There is mild irregular plaque throughout. Unable to note a significant stenosis throughout.  Summary: See table(s) above for measurements and observations. Electronically signed by Deitra Mayo MD on 06/30/2019 at 5:54:26 PM.    Final      Medications:     Scheduled Medications:  cholecalciferol  1,000 Units Oral Daily   digoxin  0.125 mg Oral QHS   escitalopram  10 mg Oral QPM   ezetimibe  10 mg Oral Daily   fenofibrate  160 mg Oral Daily   hydrALAZINE  12.5 mg Oral BID   Icosapent Ethyl  2 g Oral BID   insulin aspart  0-15 Units Subcutaneous TID WC   insulin aspart  0-5 Units Subcutaneous QHS   insulin aspart  10 Units Subcutaneous TID WC   insulin glargine  25 Units Subcutaneous BID   isosorbide mononitrate  30 mg Oral Daily   metoprolol succinate  37.5 mg Oral BID   mometasone-formoterol  2 puff Inhalation BID   multivitamin  1 tablet Oral Daily   nutrition supplement (JUVEN)  1 packet Oral BID BM   Ensure Max Protein  11 oz Oral Daily   saccharomyces boulardii  250 mg Oral Q M,W,F   senna-docusate  2 tablet Oral BID   sodium chloride flush  3 mL Intravenous Q12H   vitamin C  1,000 mg Oral QPM    Infusions:  sodium chloride 50 mL/hr at 06/30/19 8937   sodium chloride 250 mL (07/01/19 0634)   cefTRIAXone (ROCEPHIN)  IV 2 g (07/01/19 1515)   heparin 1,750 Units/hr (07/01/19 1537)    PRN Medications: sodium chloride, acetaminophen **OR** acetaminophen, albuterol, guaiFENesin-dextromethorphan, HYDROmorphone (DILAUDID) injection, loperamide, nitroGLYCERIN, oxyCODONE, sodium chloride flush  Allergies    Allergen Reactions   Iohexol Anaphylaxis   Niacin And Related     Flushing with immediate realese   Penicillins Other (See Comments)    Unknown.[name]  Did it involve swelling of the face/tongue/throat, SOB, or low BP? Unknown Did it involve sudden or severe rash/hives, skin peeling, or any reaction on the inside of your mouth or nose? Unknown Did you need to seek medical attention at a hospital or doctor's office? Unknown When did it last happen?Childhood If all above answers are NO, may proceed with cephalosporin use.     Assessment/Plan:   1. Acute on Chronic Biventricular Heart Failure  - ECHO 05/2019 EF 30-35% Severe AI, severe RV dysfunction - diuresed well with VI lasix but weight unchanged. Need  to watch intake.  - will give one more dose IV lasix today then hold in anticipation of LE angio tomorrow - On Toprol and digoxin. No ACE/ARB/ARNI with A/CKD for now  2. Chest Pain, CAD - LHC 06/29/19: Multivessel coronary disease, 50% mid LAD, 80% large OM2, 50% mid RCA, and 80-90% ostial rPDA stenoses.  - no ischemic s/s today - intolerant of statins. On Vascepa & zetia - Possible CABG at time of re-do AVR  3. Severe Aortic insuffiency in setting of mechanical AVR - H/O Mechanical AVR 2014 [facility]  - with DM2 wound question possible endocarditis however no obvious vegetation on TEE and I went back and looked at echos from past year and AI was present then so doubt acute endocarditis  - pending re-do high-risk AVR +/- CABG when other medical issues stable. Spoke with TCTS yesterday and will postpone planned surgery for now   4. L foot Diabetic Foot Ulcer - Bcx negative - on broad spectrum abx. - VVS planning LE angio tomorrow. May need toe amp - ID following. Appreciate recs. Need to discuss duration of therapy prior to re-do AVR  5. Chronic A fib  - Rate controlled on Toprol XL and digoxin. Doses cut back due to bradycardia  6. OSA -  Needs CPAP every night.   7. Diabetes  8. CKD Stage IV  - Creatinine baseline ~2.  - stable at 1.5 today  9. Bradycardia, nighttime - dig level 0.3 - metoprolol cut down to 25 bid  Length of Stay: 5   Glori Bickers MD 07/01/2019, 4:05 PM  Advanced Heart Failure Team Pager [phone] (M-F; [place])  Please contact [facility] for night-coverage after hours (4p -7a ) and weekends on [email or web address]

## 2019-07-01 NOTE — Progress Notes (Signed)
PT Cancellation Note  Patient Details Name: RIEN MARLAND MRN: [id number] DOB: 1951/03/23   Cancelled Treatment:    Reason Eval/Treat Not Completed: Patient declined, no reason specified  Reports his toe is bothering him and he does not want to walk on it. Will continue to follow and encourage mobility.    Dyneisha Murchison 07/01/2019, 10:11 AM

## 2019-07-01 NOTE — Progress Notes (Signed)
PROGRESS NOTE  Joseph Hoffman WUJ:[id number] DOB: Feb 19, 1951 DOA: 06/26/2019 PCP: Joseph Melter, MD  HPI/Recap of past 24 hours: Joseph Hoffman  is a 68 y.o. male, with medical history significant ofhistory significant ofhypertension, diabetes mellitus, asthma,sCHFwith EF 45%, CAD, atrial fibrillation on Coumadin,AS, s/p ofAVR with mechanical valveon coumadin, OSA on CPAP, CKD stage III, depression with anxiety, who presents with shortness of breath, and chest pain, as well he reports worsening left great toe ulcer with surrounding cellulitis -Patient reports he started to complain of chest pressure over last 48 hours, accompanied by dyspnea, mainly upon exertion, he did take sublingual nitro, with improvement of his chest pain, but today reported did not have any significant event on his chest pain, patient with known mechanical aortic valve insufficiency, plan to have urgent cath by Dr. Claiborne Billings next week, for anticipated aortic valve replacement by CT surgery next month. -Patient was noted to have left lower extremity diabetic great toe ulcer, report is been going on for last 2 weeks, with some drainage, as well with surrounding cellulitis, he denies any fever, chills, he did not seek any medical care for it. - in ED patient was hypertensive, mildly tachypneic, retinae elevated at 2.1 from baseline 1.8, mild leukocytosis at 14 point 9K, high-sensitivity troponins were elevated at 96>99, INR supratherapeutic at 3.9, COVID-19 is negative, EKG showing A. fib, patient was seen by cardiology for chest pain, and hospitalist were requested to admit for multiple medical problems, with a presentation of infected diabetic ulcer, gangrenous left toe with cellulitis.  07/01/19: Patient was seen and examined at his bedside.  No acute events overnight.  States he feels better today.  Minimal pain in his left toe.  Will have aaortogram LE runoff possible intervention by Dr. Carlis Abbott tomorrow.  Denies chest pain or  dyspnea at rest.    Assessment/Plan: Active Problems:   OSA on CPAP   Chronic anticoagulation   AS (aortic stenosis)   Essential hypertension   Acute combined systolic and diastolic heart failure (HCC)   CKD (chronic kidney disease), stage III (HCC)   Acute on chronic systolic (congestive) heart failure (HCC)   Depression   Foot ulcer, left (HCC)   Aortic valve regurgitation   SOB (shortness of breath)   Aortic valve disease  Sepsis secondary to L great toe gangrenous diabetic ulcer with surrounding cellulitis Presented with leukocytosis with WBC 14 K, tachypnea with respiratory rate 31 Elevated CRP 9.5, sed rate 25 on 06/29/2019 Continue broad-spectrum IV antibiotics IV Vancomycin, IV Rocephin IV Flagyl discontinued since no evidence of osteomyelitis on x-ray Vascular surgery following.  Left lower extremity arterial Dopplers completed. Infectious disease following.  Continue recommendations per infectious disease.  Left great toe diabetic ulcer/gangrenous with surrounding cellulitis in the setting of PVD Mildly elevated CRP and ESR Continue broad-spectrum IV antibiotics empirically Vascular surgery and ID following Aortogram planned tomorrow  AKI on CKD3 Baseline creatinine appears to be 1.8 with GFR of 37 Presented with creatinine of 2.19 with GFR of 29 Cr 1.37 from 1.96  off diuretics Creatinine trending up to 1.56 from 1.37 yesterday Off diuretics at this time Cardiology following Net I&O -4.1 L since admission  A. fib with intermittent slow ventricular response Heart rate in the 50s Continue to closely monitor on telemetry Blood pressure soft  Severe aortic insufficiency/RV dysfunction Status post aortic valve replacement and awaiting redo aortic valve replacement which has been postponed Possible CABG at time of redo AVR  Resolved leukocytosis WBC noted to be  trending up this morning 12K from 11K>>10K Afebrile Blood cultures negative x4 days  Resolved  acute hypervolemic hyponatremia, asymptomatic Sodium 129 from 136 yesterday Resolved Sodium level 138 on 07/01/2019 Continue daily BMPs  Subtherapeutic INR INR today 1.4, off Coumadin Continue heparin drip due to possible procedure.  Acute on chronic combined diastolic and systolic CHF Presented with BNP greater than 600 Cardiology following Continue cardiac medications as recommended by cardiology Post right and left heart cath 06/29/19 Continue strict I's and O's and daily weight  Acute on chronic hypoxic respiratory failure likely secondary to acute on chronic combined CHF Improving Wean off O2 supplementation Self reports on home O2 supplementation only at night  Severe aortic regurgitation post mechanical AVR On Coumadin, held due to possible procedure  Chest pain, resolved   Permanent atrial fibrillation Currently rate controlled Off Coumadin, held due to possible procedure  On heparin drip  OSA Continue CPAP at night  Hyperlipidemia Intolerant to statin On fenofibrate, icosapent, and zetia     Code Status: Full code  Family Communication: We will call family if okay with the patient.  Disposition Plan: Discharge to home possibly in 1-2 days or when cardiology, vascular surgery and infectious disease sign off.  Consultants:  Cardiology  Vascular surgery  Infectious disease  Procedures:  Heart cath on 06/29/2019  Antimicrobials:  IV vancomycin, Rocephin, IV Flagyl  DVT prophylaxis: Heparin drip.   Objective: Vitals:   07/01/19 0528 07/01/19 0533 07/01/19 0707 07/01/19 0818  BP: 132/69   (!) 116/53  Pulse: (!) 55  60 62  Resp: _0 Temp: 97.6 F (36.4 C)   98.2 F (36.8 C)  TempSrc: Oral   Oral  SpO2: 100%  100% 95%  Weight:  115.9 kg    Height:        Intake/Output Summary (Last 24 hours) at 07/01/2019 1158 Last data filed at 07/01/2019 1050 Gross per 24 hour  Intake 2225.83 ml  Output 2151 ml  Net 74.83 ml   Filed Weights    06/29/19 0610 06/30/19 0425 07/01/19 0533  Weight: 114.2 kg 115.9 kg 115.9 kg    Exam:   General: 68 y.o. year-old male well-developed well-nourished in no acute distress.  Alert and oriented x3.    Cardiovascular: Irregular rate and rhythm no rubs or gallops.  No jugular vein distention, no thyromegaly noted.  Respiratory: Clear to Auscultation No Wheezes or Rales.  Poor Inspiratory Effort.    Abdomen: Obese nontender nondistended normal bowel sounds present  Musculoskeletal: 1+ pitting edema in lower extremities bilaterally.    Psychiatry: Mood is appropriate for condition and setting.   Data Reviewed: CBC: Recent Labs  Lab 06/27/19 0415 06/28/19 0602 06/29/19 0311 06/29/19 1044 06/29/19 1050 06/30/19 0551 07/01/19 0515  WBC 12.4* 11.1* 12.2*  --   --  10.0 10.9*  NEUTROABS  --  7.8*  --   --   --   --   --   HGB 13.2 12.5* 12.8* 12.6* 12.2* 11.3* 11.9*  HCT 43.1 40.6 39.9 37.0* 36.0* 35.7* 37.3*  MCV 83.9 83.7 81.3  --   --  81.0 81.6  PLT 266 237 253  --   --  229 220   Basic Metabolic Panel: Recent Labs  Lab 06/27/19 0415 06/28/19 0602 06/29/19 0311 06/29/19 1044 06/29/19 1050 06/30/19 0551 07/01/19 0515  NA 135 134* 133* 136 136 129* 138  K 3.9 4.2 3.6 3.6 3.5 4.1 3.5  CL 96* 97* 97*  --   --  97* 104  CO2 25 24 21*  --   --  22 24  GLUCOSE 170* 140* 98  --   --  353* 75  BUN 45* 45* 57*  --   --  52* 48*  CREATININE 2.19* 1.92* 1.96*  --   --  1.37* 1.56*  CALCIUM 9.2 9.0 9.1  --   --  8.5* 8.8*   GFR: Estimated Creatinine Clearance: 57 mL/min (A) (by C-G formula based on SCr of 1.56 mg/dL (H)). Liver Function Tests: No results for input(s): AST, ALT, ALKPHOS, BILITOT, PROT, ALBUMIN in the last 168 hours. No results for input(s): LIPASE, AMYLASE in the last 168 hours. No results for input(s): AMMONIA in the last 168 hours. Coagulation Profile: Recent Labs  Lab 06/28/19 0602 06/28/19 1143 06/29/19 0311 06/30/19 0551 07/01/19 0515  INR  2.5* 2.4* 1.7* 1.4* 1.4*   Cardiac Enzymes: No results for input(s): CKTOTAL, CKMB, CKMBINDEX, TROPONINI in the last 168 hours. BNP (last 3 results) No results for input(s): PROBNP in the last 8760 hours. HbA1C: No results for input(s): HGBA1C in the last 72 hours. CBG: Recent Labs  Lab 06/30/19 1635 06/30/19 2140 07/01/19 0606 07/01/19 0632 07/01/19 1128  GLUCAP 363* 242* 67* 98 363*   Lipid Profile: No results for input(s): CHOL, HDL, LDLCALC, TRIG, CHOLHDL, LDLDIRECT in the last 72 hours. Thyroid Function Tests: No results for input(s): TSH, T4TOTAL, FREET4, T3FREE, THYROIDAB in the last 72 hours. Anemia Panel: No results for input(s): VITAMINB12, FOLATE, FERRITIN, TIBC, IRON, RETICCTPCT in the last 72 hours. Urine analysis:    Component Value Date/Time   COLORURINE YELLOW 09/25/2012 1506   APPEARANCEUR CLEAR 09/25/2012 1506   LABSPEC 1.045 (H) 09/25/2012 1506   PHURINE 6.0 09/25/2012 1506   GLUCOSEU >1000 (A) 09/25/2012 1506   HGBUR NEGATIVE 09/25/2012 1506   BILIRUBINUR NEGATIVE 09/25/2012 1506   KETONESUR NEGATIVE 09/25/2012 1506   PROTEINUR NEGATIVE 09/25/2012 1506   UROBILINOGEN 0.2 09/25/2012 1506   NITRITE NEGATIVE 09/25/2012 1506   LEUKOCYTESUR NEGATIVE 09/25/2012 1506   Sepsis Labs: _0 (procalcitonin:4,lacticidven:4)  ) Recent Results (from the past 240 hour(s))  SARS Coronavirus 2 (CEPHEID - Performed in [place] hospital lab), Hosp Order     Status: None   Collection Time: 06/26/19  1:15 PM   Specimen: Nasopharyngeal Swab  Result Value Ref Range Status   SARS Coronavirus 2 NEGATIVE NEGATIVE Final    Comment: (NOTE) If result is NEGATIVE SARS-CoV-2 target nucleic acids are NOT DETECTED. The SARS-CoV-2 RNA is generally detectable in upper and lower  respiratory specimens during the acute phase of infection. The lowest  concentration of SARS-CoV-2 viral copies this assay can detect is 250  copies / mL. A negative result does not preclude  SARS-CoV-2 infection  and should not be used as the sole basis for treatment or other  patient management decisions.  A negative result may occur with  improper specimen collection / handling, submission of specimen other  than nasopharyngeal swab, presence of viral mutation(s) within the  areas targeted by this assay, and inadequate number of viral copies  (<250 copies / mL). A negative result must be combined with clinical  observations, patient history, and epidemiological information. If result is POSITIVE SARS-CoV-2 target nucleic acids are DETECTED. The SARS-CoV-2 RNA is generally detectable in upper and lower  respiratory specimens dur ing the acute phase of infection.  Positive  results are indicative of active infection with SARS-CoV-2.  Clinical  correlation with patient history and other diagnostic information  is  necessary to determine patient infection status.  Positive results do  not rule out bacterial infection or co-infection with other viruses. If result is PRESUMPTIVE POSTIVE SARS-CoV-2 nucleic acids MAY BE PRESENT.   A presumptive positive result was obtained on the submitted specimen  and confirmed on repeat testing.  While 2019 novel coronavirus  (SARS-CoV-2) nucleic acids may be present in the submitted sample  additional confirmatory testing may be necessary for epidemiological  and / or clinical management purposes  to differentiate between  SARS-CoV-2 and other Sarbecovirus currently known to infect humans.  If clinically indicated additional testing with an alternate test  methodology [phone]) is advised. The SARS-CoV-2 RNA is generally  detectable in upper and lower respiratory sp ecimens during the acute  phase of infection. The expected result is Negative. Fact Sheet for Patients:  StrictlyIdeas.no Fact Sheet for Healthcare Providers: BankingDealers.co.za This test is not yet approved or cleared by the  [place] FDA and has been authorized for detection and/or diagnosis of SARS-CoV-2 by FDA under an Emergency Use Authorization (EUA).  This EUA will remain in effect (meaning this test can be used) for the duration of the COVID-19 declaration under Section 564(b)(1) of the Act, 21 U.S.C. section 360bbb-3(b)(1), unless the authorization is terminated or revoked sooner. Performed at [facility], [place] [address] [place], [place] [address]   Culture, blood (routine x 2)     Status: None   Collection Time: 06/26/19  2:46 PM   Specimen: BLOOD  Result Value Ref Range Status   Specimen Description BLOOD RIGHT ANTECUBITAL  Final   Special Requests   Final    BOTTLES DRAWN AEROBIC AND ANAEROBIC Blood Culture results may not be optimal due to an excessive volume of blood received in culture bottles   Culture   Final    NO GROWTH 5 DAYS Performed at [facility], [place] [address], [place], [place] [address]    Report Status 07/01/2019 FINAL  Final  Culture, blood (routine x 2)     Status: None   Collection Time: 06/26/19  2:52 PM   Specimen: BLOOD LEFT FOREARM  Result Value Ref Range Status   Specimen Description BLOOD LEFT FOREARM  Final   Special Requests   Final    BOTTLES DRAWN AEROBIC AND ANAEROBIC Blood Culture adequate volume   Culture   Final    NO GROWTH 5 DAYS Performed at [facility], [place] [address], [place], [place] [address]    Report Status 07/01/2019 FINAL  Final      Studies: Dg Foot Complete Left  Result Date: 06/30/2019 CLINICAL DATA:  Osteomyelitis of left great toe and foot. Hx of diabetes, CHF, arthritis. EXAM: LEFT FOOT - COMPLETE 3+ VIEW COMPARISON:  None. FINDINGS: Soft tissue ulceration noted along the medial margin of the great toe at and proximal to the level of the IP joint. No bone resorption or focal osteopenia to suggest osteomyelitis. No fractures.  No dislocation. There is narrowing of the interphalangeal joints consistent with mild  osteoarthritis. Mild degenerative spurring is noted along the dorsal aspect the intertarsal articulations. Ossification is noted along plantar fascia and there are calcifications along the arteries of the ankle and foot. IMPRESSION: 1. No fracture. 2. No radiographic evidence of osteomyelitis. 3. Also along the medial margin of the great toe. Electronically Signed   By: Lajean Manes M.D.   On: 06/30/2019 19:21    Scheduled Meds:  cholecalciferol  1,000 Units Oral  Daily   digoxin  0.125 mg Oral QHS   escitalopram  10 mg Oral QPM   ezetimibe  10 mg Oral Daily   feeding supplement  1 Container Oral TID BM   fenofibrate  160 mg Oral Daily   hydrALAZINE  12.5 mg Oral BID   Icosapent Ethyl  2 g Oral BID   insulin aspart  0-15 Units Subcutaneous TID WC   insulin aspart  0-5 Units Subcutaneous QHS   insulin aspart  10 Units Subcutaneous TID WC   insulin glargine  25 Units Subcutaneous BID   isosorbide mononitrate  30 mg Oral Daily   metoprolol succinate  37.5 mg Oral BID   mometasone-formoterol  2 puff Inhalation BID   multivitamin  1 tablet Oral Daily   nutrition supplement (JUVEN)  1 packet Oral BID BM   saccharomyces boulardii  250 mg Oral Q M,W,F   senna-docusate  2 tablet Oral BID   sodium chloride flush  3 mL Intravenous Q12H   vitamin C  1,000 mg Oral QPM    Continuous Infusions:  sodium chloride 50 mL/hr at 06/30/19 0635   sodium chloride 250 mL (07/01/19 0634)   cefTRIAXone (ROCEPHIN)  IV 2 g (06/30/19 1600)   heparin 1,750 Units/hr (07/01/19 0114)   vancomycin 1,000 mg (06/30/19 1929)     LOS: 5 days     Kayleen Memos, MD [facility] Pager [phone]  If 7PM-7AM, please contact night-coverage [email or web address] Password [facility] 07/01/2019, 11:58 AM

## 2019-07-01 NOTE — Progress Notes (Signed)
[facility] for Infectious Disease  Date of Admission:  06/26/2019     Total days of antibiotics 6        ASSESSMENT/PLAN  Joseph Hoffman is a 68 year old male with poorly controlled diabetes and severe cardiac dysfunction status post aortic valve replacement and awaiting redo aortic valve replacement admitted with worsening cardiac function and a diabetic foot ulcer with gangrenous changes and surrounding cellulitis of the left lower extremity.  X-rays without evidence of osteomyelitis.  Concern remains for circulation and ability for wound to heal.  Awaiting aortogram tomorrow.  Severe aortic regurgitation -CVTS planning on redo aortic valve replacement.  Currently on hold until infection is resolved through antibiotic therapy or possible amputation.  Gangrenous left first toe with surrounding cellulitis -improved today with broad-spectrum antibiotics.  No osteomyelitis noted on x-ray.  Continue current dose of vancomycin and ceftriaxone pending aortogram and plan of care for the left foot.  CKD stage III -renal function remained stable with most recent creatinine of 1.56.  Therapeutic drug monitoring -as noted renal function remained stable with no evidence of nephrotoxicity with vancomycin.  Continue dosing/monitoring per pharmacy protocol.   Active Problems:   OSA on CPAP   Chronic anticoagulation   AS (aortic stenosis)   Essential hypertension   Acute combined systolic and diastolic heart failure (HCC)   CKD (chronic kidney disease), stage III (HCC)   Acute on chronic systolic (congestive) heart failure (HCC)   Depression   Foot ulcer, left (HCC)   Aortic valve regurgitation   SOB (shortness of breath)   Aortic valve disease   . cholecalciferol  1,000 Units Oral Daily  . digoxin  0.125 mg Oral QHS  . escitalopram  10 mg Oral QPM  . ezetimibe  10 mg Oral Daily  . feeding supplement  1 Container Oral TID BM  . fenofibrate  160 mg Oral Daily  . furosemide  80 mg  Intravenous BID  . hydrALAZINE  12.5 mg Oral BID  . Icosapent Ethyl  2 g Oral BID  . insulin aspart  0-15 Units Subcutaneous TID WC  . insulin aspart  0-5 Units Subcutaneous QHS  . insulin aspart  10 Units Subcutaneous TID WC  . insulin glargine  34 Units Subcutaneous BID  . isosorbide mononitrate  30 mg Oral Daily  . metoprolol succinate  37.5 mg Oral BID  . mometasone-formoterol  2 puff Inhalation BID  . multivitamin  1 tablet Oral Daily  . nutrition supplement (JUVEN)  1 packet Oral BID BM  . saccharomyces boulardii  250 mg Oral Q M,W,F  . senna-docusate  2 tablet Oral BID  . sodium chloride flush  3 mL Intravenous Q12H  . vitamin C  1,000 mg Oral QPM    SUBJECTIVE:  Afebrile overnight with no acute events.  Feeling better today.  Recently spoke with cardiology and vascular surgery regarding treatment options.  Allergies  Allergen Reactions  . Iohexol Anaphylaxis  . Niacin And Related     Flushing with immediate realese  . Penicillins Other (See Comments)    Unknown.[name]  Did it involve swelling of the face/tongue/throat, SOB, or low BP? Unknown Did it involve sudden or severe rash/hives, skin peeling, or any reaction on the inside of your mouth or nose? Unknown Did you need to seek medical attention at a hospital or doctor's office? Unknown When did it last happen?Childhood If all above answers are "NO", may proceed with cephalosporin use.     Review  of Systems: Review of Systems  Constitutional: Negative for chills, fever and weight loss.  Respiratory: Negative for cough, shortness of breath and wheezing.   Cardiovascular: Negative for chest pain and leg swelling.  Gastrointestinal: Negative for abdominal pain, constipation, diarrhea, nausea and vomiting.  Skin: Negative for rash.      OBJECTIVE: Vitals:   07/01/19 0528 07/01/19 0533 07/01/19 0707 07/01/19 0818  BP: 132/69   (!) 116/53  Pulse: (!) 55  60 62  Resp: 20  18 20   Temp:  97.6 F (36.4 C)   98.2 F (36.8 C)  TempSrc: Oral   Oral  SpO2: 100%  100% 95%  Weight:  115.9 kg    Height:       Body mass index is 35.65 kg/m.  Physical Exam Constitutional:      General: He is not in acute distress.    Appearance: He is well-developed.     Comments: Seated on the side of the bed; pleasant  Cardiovascular:     Rate and Rhythm: Normal rate and regular rhythm.     Heart sounds: Normal heart sounds.  Pulmonary:     Effort: Pulmonary effort is normal.     Breath sounds: Normal breath sounds.  Musculoskeletal:     Comments: Left lower extremity appears with mildly decreased edema and redness.  Left great toe remains unchanged.  Skin:    General: Skin is warm and dry.  Neurological:     Mental Status: He is alert.  Psychiatric:        Mood and Affect: Mood normal.        Thought Content: Thought content normal.        Judgment: Judgment normal.     Lab Results Lab Results  Component Value Date   WBC 10.9 (H) 07/01/2019   HGB 11.9 (L) 07/01/2019   HCT 37.3 (L) 07/01/2019   MCV 81.6 07/01/2019   PLT 250 07/01/2019    Lab Results  Component Value Date   CREATININE 1.56 (H) 07/01/2019   BUN 48 (H) 07/01/2019   NA 138 07/01/2019   K 3.5 07/01/2019   CL 104 07/01/2019   CO2 24 07/01/2019    Lab Results  Component Value Date   ALT 28 05/28/2019   AST 37 05/28/2019   ALKPHOS 27 (L) 05/28/2019   BILITOT 1.0 05/28/2019     Microbiology: Recent Results (from the past 240 hour(s))  SARS Coronavirus 2 (CEPHEID - Performed in [place] hospital lab), Hosp Order     Status: None   Collection Time: 06/26/19  1:15 PM   Specimen: Nasopharyngeal Swab  Result Value Ref Range Status   SARS Coronavirus 2 NEGATIVE NEGATIVE Final    Comment: (NOTE) If result is NEGATIVE SARS-CoV-2 target nucleic acids are NOT DETECTED. The SARS-CoV-2 RNA is generally detectable in upper and lower  respiratory specimens during the acute phase of infection. The lowest   concentration of SARS-CoV-2 viral copies this assay can detect is 250  copies / mL. A negative result does not preclude SARS-CoV-2 infection  and should not be used as the sole basis for treatment or other  patient management decisions.  A negative result may occur with  improper specimen collection / handling, submission of specimen other  than nasopharyngeal swab, presence of viral mutation(s) within the  areas targeted by this assay, and inadequate number of viral copies  (<250 copies / mL). A negative result must be combined with clinical  observations, patient history, and  epidemiological information. If result is POSITIVE SARS-CoV-2 target nucleic acids are DETECTED. The SARS-CoV-2 RNA is generally detectable in upper and lower  respiratory specimens dur ing the acute phase of infection.  Positive  results are indicative of active infection with SARS-CoV-2.  Clinical  correlation with patient history and other diagnostic information is  necessary to determine patient infection status.  Positive results do  not rule out bacterial infection or co-infection with other viruses. If result is PRESUMPTIVE POSTIVE SARS-CoV-2 nucleic acids MAY BE PRESENT.   A presumptive positive result was obtained on the submitted specimen  and confirmed on repeat testing.  While 2019 novel coronavirus  (SARS-CoV-2) nucleic acids may be present in the submitted sample  additional confirmatory testing may be necessary for epidemiological  and / or clinical management purposes  to differentiate between  SARS-CoV-2 and other Sarbecovirus currently known to infect humans.  If clinically indicated additional testing with an alternate test  methodology [phone]) is advised. The SARS-CoV-2 RNA is generally  detectable in upper and lower respiratory sp ecimens during the acute  phase of infection. The expected result is Negative. Fact Sheet for Patients:  StrictlyIdeas.no Fact Sheet  for Healthcare Providers: BankingDealers.co.za This test is not yet approved or cleared by the [place] FDA and has been authorized for detection and/or diagnosis of SARS-CoV-2 by FDA under an Emergency Use Authorization (EUA).  This EUA will remain in effect (meaning this test can be used) for the duration of the COVID-19 declaration under Section 564(b)(1) of the Act, 21 U.S.C. section 360bbb-3(b)(1), unless the authorization is terminated or revoked sooner. Performed at [facility], [place] [address], [place], [place] [address]   Culture, blood (routine x 2)     Status: None   Collection Time: 06/26/19  2:46 PM   Specimen: BLOOD  Result Value Ref Range Status   Specimen Description BLOOD RIGHT ANTECUBITAL  Final   Special Requests   Final    BOTTLES DRAWN AEROBIC AND ANAEROBIC Blood Culture results may not be optimal due to an excessive volume of blood received in culture bottles   Culture   Final    NO GROWTH 5 DAYS Performed at [facility], [place] [address], [place], [place] [address]    Report Status 07/01/2019 FINAL  Final  Culture, blood (routine x 2)     Status: None   Collection Time: 06/26/19  2:52 PM   Specimen: BLOOD LEFT FOREARM  Result Value Ref Range Status   Specimen Description BLOOD LEFT FOREARM  Final   Special Requests   Final    BOTTLES DRAWN AEROBIC AND ANAEROBIC Blood Culture adequate volume   Culture   Final    NO GROWTH 5 DAYS Performed at [facility], [place] [address], [place], [place] [address]    Report Status 07/01/2019 FINAL  Final     Terri Piedra, NP [facility] [phone] Pager  07/01/2019  10:23 AM

## 2019-07-01 NOTE — Progress Notes (Signed)
[place] NOTE  Pharmacy Consult for Heparin Indication: atrial fibrillation/mechanical AVR  Allergies  Allergen Reactions  . Iohexol Anaphylaxis  . Niacin And Related     Flushing with immediate realese  . Penicillins Other (See Comments)    Unknown.[name]  Did it involve swelling of the face/tongue/throat, SOB, or low BP? Unknown Did it involve sudden or severe rash/hives, skin peeling, or any reaction on the inside of your mouth or nose? Unknown Did you need to seek medical attention at a hospital or doctor's office? Unknown When did it last happen?Childhood If all above answers are "NO", may proceed with cephalosporin use.    Patient Measurements: Height: 5\' 11"  (180.3 cm) Weight: 255 lb 9.6 oz (115.9 kg) IBW/kg (Calculated) : 75.3  Heparin dosing weight: 100 kg  Vital Signs: Temp: 98.2 F (36.8 C) (07/29 0818) Temp Source: Oral (07/29 0818) BP: 116/53 (07/29 0818) Pulse Rate: 62 (07/29 0818)  Labs: Recent Labs    06/29/19 0311  06/29/19 1050 06/30/19 0551 07/01/19 0515  HGB 12.8*   < > 12.2* 11.3* 11.9*  HCT 39.9   < > 36.0* 35.7* 37.3*  PLT 253  --   --  229 250  LABPROT 19.6*  --   --  17.2* 16.6*  INR 1.7*  --   --  1.4* 1.4*  HEPARINUNFRC 0.16*  --   --  0.36 0.52  CREATININE 1.96*  --   --  1.37* 1.56*   < > = values in this interval not displayed.    Estimated Creatinine Clearance: 57 mL/min (A) (by C-G formula based on SCr of 1.56 mg/dL (H)).   Assessment: 56 YOM with history of Afib and mechanical AVR on Coumadin PTA.  Patient is s/p cath 06/29/19 and will need  redo AVR +/- CABG after foot infection resolved.    Heparin level 0.52 this am on heparin drip 1750 uts/hr.  No s/sx of bleeding or infusion issues. H/H stable plt stable at 229.   Goal of Therapy:  Heparin level 0.3-0.7 units/ml  Monitor platelets by anticoagulation protocol: Yes  Vanc AUC 400-550   Plan:  Continue  heparin gtt at  1750 units/hr Daily heparin level and CBC Monitor s/sx of bleeding/hematoma  Bonnita Nasuti Pharm.D. CPP, BCPS Clinical Pharmacist [phone] 07/01/2019 12:27 PM

## 2019-07-01 NOTE — Progress Notes (Signed)
Nutrition Follow-up  DOCUMENTATION CODES:   Obesity unspecified  INTERVENTION:   -Continue 1000 mg vitamin C daily -Continue MVI with minerals daily -Ensure Max po daily, each supplement provides 150 kcal and 30 grams of protein.  -D/c Boost Breeze -Continue 1 packet Juven BID, each packet provides 95 calories, 2.5 grams of protein (collagen), and 9.8 grams of carbohydrate (3 grams sugar); also contains 7 grams of L-arginine and L-glutamine, 300 mg vitamin C, 15 mg vitamin E, 1.2 mcg vitamin B-12, 9.5 mg zinc, 200 mg calcium, and 1.5 g  Calcium Beta-hydroxy-Beta-methylbutyrate to support wound healing  NUTRITION DIAGNOSIS:   Increased nutrient needs related to wound healing(cellulitis; left leg, diabetic ulcer; left; great toe) as evidenced by estimated needs.  Ongoing  GOAL:   Patient will meet greater than or equal to 90% of their needs  Progressing   MONITOR:   Skin, Supplement acceptance, PO intake, Labs, Weight trends  REASON FOR ASSESSMENT:   Consult Wound healing  ASSESSMENT:   68 year old male with past medical history significant of a-fib, chronic combined CHF, AVR in 2013, HTN, OSA on CPAP, T2DM, CKD3, and hepatitis. Pt admitted with LLE cellulitis and acute on chronic biventricular HF with severe aortic regurgitation  7/24 ECG: a-fib with IVCD, left anterior fascicular  6/26 TEE: LVEF 30-35%; RV has severely reduced systolic function 7/37: s/p rt/lt heart cath and coronary angiography  Reviewed I/O's: +368 ml x 24 hours and -3.6 L since admission  UOP: 2 L x 24 hours  Pt receiving personal care at time of visit.   Pt with good appetite; consuming 100% of meals. Pt is also compliant with Juven, Boost Breeze, and MVI supplements.   Per vascular surgery, lt great toe DM ulcer with gangrenous changes and plan for aortogram tomorrow (07/02/19). Pt may require amputation.  Lab Results  Component Value Date   HGBA1C 8.7 (H) 05/13/2019   PTA DM medications  are 20-35 units insulin glargine BID,25 mg empagliflozin daily and 2 mg glimpeiride daily.   Labs reviewed: CBGS: 67-242  (inpatient orders for glycemic control are 0-15 units insulin aspart TID with meals, 0-5 units insulin aspart q HS, 10 units insulin aspart TID with meals, and 34 units insulin glargine BID ).   Diet Order:   Diet Order            Diet NPO time specified Except for: Sips with Meds  Diet effective midnight        Diet Heart Room service appropriate? Yes; Fluid consistency: Thin  Diet effective now              EDUCATION NEEDS:   No education needs have been identified at this time  Skin:  Skin Assessment: Skin Integrity Issues: Skin Integrity Issues:: Diabetic Ulcer Diabetic Ulcer: lt anterior great toe  Last BM:  06/30/19  Height:   Ht Readings from Last 1 Encounters:  06/26/19 5\' 11"  (1.803 m)    Weight:   Wt Readings from Last 1 Encounters:  07/01/19 115.9 kg    Ideal Body Weight:  78.2 kg  BMI:  Body mass index is 35.65 kg/m.  Estimated Nutritional Needs:   Kcal:  2200-2400  Protein:  120-130  Fluid:  >2.2L    Dail Meece A. Jimmye Norman, RD, LDN, [place] Registered Dietitian II Certified Diabetes Care and Education Specialist Pager: [phone] After hours Pager: [phone]

## 2019-07-01 NOTE — Progress Notes (Signed)
Results for BURAK, ZERBE (MRN [id number]) as of 07/01/2019 08:49  Ref. Range 07/01/2019 06:32  Glucose-Capillary Latest Ref Range: 70 - 99 mg/dL 98  Noted that patient had a CBG of 68 mg/dl this am.   Recommend decreasing Lantus dosage 20% to Lantus 28 units BID if blood sugars continue to be less than 70 mg/dl. Will continue to monitor blood sugars while in the hospital.  Harvel Ricks RN BSN CDE Diabetes Coordinator Pager: [phone]  8am-5pm

## 2019-07-01 NOTE — Progress Notes (Addendum)
Duplex results noted no obvious flow-limiting lesion left leg.  However suboptimal study secondary to cardiac arrhythmia.  We will proceed with aortogram lower extremity runoff possible intervention by Dr. Carlis Abbott tomorrow.  If no intervention is performed most likely the patient will need amputation of the left first toe.  This could be performed on potentially Friday, July 31 or at latest Monday, August 3.  Ending cardiac surgery is currently delayed.  I discussed with the patient today aortogram lower extremity runoff procedure details for tomorrow with Dr. Carlis Abbott.  He understands and agrees to proceed.  He will need to be n.p.o. after midnight.  His heparin can be discontinued on call to his procedure tomorrow.  Ruta Hinds, MD [facility] Office: [phone] Pager: [phone]

## 2019-07-02 ENCOUNTER — Inpatient Hospital Stay (HOSPITAL_COMMUNITY)
Admission: EM | Disposition: A | Discharge status: Home / Self Care | Payer: Self-pay | Source: Home / Self Care | Attending: Internal Medicine

## 2019-07-02 ENCOUNTER — Encounter (HOSPITAL_COMMUNITY): Payer: Self-pay | Admitting: Vascular Surgery

## 2019-07-02 ENCOUNTER — Ambulatory Visit (HOSPITAL_COMMUNITY): Admit: 2019-07-02 | Payer: Medicare Other | Admitting: Cardiovascular Disease

## 2019-07-02 ENCOUNTER — Encounter (HOSPITAL_COMMUNITY): Payer: Medicare Other

## 2019-07-02 DIAGNOSIS — I70245 Atherosclerosis of native arteries of left leg with ulceration of other part of foot: Secondary | ICD-10-CM

## 2019-07-02 HISTORY — PX: PERIPHERAL VASCULAR INTERVENTION: CATH118257

## 2019-07-02 HISTORY — PX: ABDOMINAL AORTOGRAM W/LOWER EXTREMITY: CATH118223

## 2019-07-02 LAB — BASIC METABOLIC PANEL
Anion gap: 9 (ref 5–15)
Anion gap: 9 (ref 5–15)
BUN: 44 mg/dL — ABNORMAL HIGH (ref 8–23)
BUN: 43 mg/dL — ABNORMAL HIGH (ref 8–23)
CO2: 22 mmol/L (ref 22–32)
CO2: 24 mmol/L (ref 22–32)
Calcium: 8.7 mg/dL — ABNORMAL LOW (ref 8.9–10.3)
Calcium: 8.7 mg/dL — ABNORMAL LOW (ref 8.9–10.3)
Chloride: 105 mmol/L (ref 98–111)
Chloride: 99 mmol/L (ref 98–111)
Creatinine, Ser: 1.36 mg/dL — ABNORMAL HIGH (ref 0.61–1.24)
Creatinine, Ser: 1.7 mg/dL — ABNORMAL HIGH (ref 0.61–1.24)
GFR calc Af Amer: >60 mL/min (ref >60)
GFR calc Af Amer: 46 mL/min — ABNORMAL LOW (ref >60)
GFR calc non Af Amer: 52 mL/min — ABNORMAL LOW (ref >60)
GFR calc non Af Amer: 40 mL/min — ABNORMAL LOW (ref >60)
Glucose, Bld: 156 mg/dL — ABNORMAL HIGH (ref 70–99)
Glucose, Bld: 573 mg/dL (ref 70–99)
Potassium: 4.2 mmol/L (ref 3.5–5.1)
Potassium: 5 mmol/L (ref 3.5–5.1)
Sodium: 136 mmol/L (ref 135–145)
Sodium: 132 mmol/L — ABNORMAL LOW (ref 135–145)

## 2019-07-02 LAB — HEPATITIS C ANTIBODY: HCV Ab: <0.1 s/co ratio (ref 0.0–0.9)

## 2019-07-02 LAB — CBC
HCT: 37.5 % — ABNORMAL LOW (ref 39.0–52.0)
Hemoglobin: 11.6 g/dL — ABNORMAL LOW (ref 13.0–17.0)
MCH: 25.8 pg — ABNORMAL LOW (ref 26.0–34.0)
MCHC: 30.9 g/dL (ref 30.0–36.0)
MCV: 83.5 fL (ref 80.0–100.0)
Platelets: 245 10*3/uL (ref 150–400)
RBC: 4.49 MIL/uL (ref 4.22–5.81)
RDW: 17.6 % — ABNORMAL HIGH (ref 11.5–15.5)
WBC: 9.9 10*3/uL (ref 4.0–10.5)
nRBC: 0 % (ref 0.0–0.2)

## 2019-07-02 LAB — PROTIME-INR
INR: 1.4 — ABNORMAL HIGH (ref 0.8–1.2)
Prothrombin Time: 16.7 seconds — ABNORMAL HIGH (ref 11.4–15.2)

## 2019-07-02 LAB — SURGICAL PCR SCREEN
MRSA, PCR: NEGATIVE
Staphylococcus aureus: NEGATIVE

## 2019-07-02 LAB — GLUCOSE, CAPILLARY
Glucose-Capillary: 144 mg/dL — ABNORMAL HIGH (ref 70–99)
Glucose-Capillary: 173 mg/dL — ABNORMAL HIGH (ref 70–99)
Glucose-Capillary: >600 mg/dL (ref 70–99)
Glucose-Capillary: >600 mg/dL (ref 70–99)

## 2019-07-02 LAB — MAGNESIUM: Magnesium: 2.5 mg/dL — ABNORMAL HIGH (ref 1.7–2.4)

## 2019-07-02 LAB — HEPARIN LEVEL (UNFRACTIONATED): Heparin Unfractionated: 0.37 IU/mL (ref 0.30–0.70)

## 2019-07-02 LAB — GLUCOSE, RANDOM: Glucose, Bld: 582 mg/dL (ref 70–99)

## 2019-07-02 SURGERY — ABDOMINAL AORTOGRAM W/LOWER EXTREMITY
Anesthesia: LOCAL

## 2019-07-02 SURGERY — RIGHT/LEFT HEART CATH AND CORONARY ANGIOGRAPHY
Anesthesia: LOCAL

## 2019-07-02 MED ORDER — METHYLPREDNISOLONE SODIUM SUCC 125 MG IJ SOLR
125.0000 mg | Freq: Once | INTRAMUSCULAR | Status: AC
  Administered 2019-07-02: 125 mg via INTRAVENOUS
  Filled 2019-07-02: qty 2

## 2019-07-02 MED ORDER — LIDOCAINE HCL (PF) 1 % IJ SOLN
INTRAMUSCULAR | Status: AC
  Filled 2019-07-02: qty 30

## 2019-07-02 MED ORDER — HEPARIN (PORCINE) IN NACL 1000-0.9 UT/500ML-% IV SOLN
INTRAVENOUS | Status: AC
  Filled 2019-07-02: qty 1000

## 2019-07-02 MED ORDER — IODIXANOL 320 MG/ML IV SOLN
INTRAVENOUS | Status: DC | PRN
  Administered 2019-07-02: 70 mL via INTRAVENOUS

## 2019-07-02 MED ORDER — FUROSEMIDE 10 MG/ML IJ SOLN
80.0000 mg | Freq: Once | INTRAMUSCULAR | Status: AC
  Administered 2019-07-02: 80 mg via INTRAVENOUS
  Filled 2019-07-02: qty 8

## 2019-07-02 MED ORDER — SODIUM CHLORIDE 0.9 % IV SOLN
INTRAVENOUS | Status: DC
  Administered 2019-07-02: 05:00:00 via INTRAVENOUS

## 2019-07-02 MED ORDER — SODIUM CHLORIDE 0.9 % IV SOLN: 250.0000 mL | INTRAVENOUS | Status: DC | PRN

## 2019-07-02 MED ORDER — LIDOCAINE HCL (PF) 1 % IJ SOLN
INTRAMUSCULAR | Status: DC | PRN
  Administered 2019-07-02: 20 mL via INTRADERMAL

## 2019-07-02 MED ORDER — DIPHENHYDRAMINE HCL 50 MG/ML IJ SOLN
INTRAMUSCULAR | Status: AC
  Filled 2019-07-02: qty 1

## 2019-07-02 MED ORDER — HEPARIN SODIUM (PORCINE) 1000 UNIT/ML IJ SOLN
INTRAMUSCULAR | Status: AC
  Filled 2019-07-02: qty 1

## 2019-07-02 MED ORDER — INSULIN ASPART 100 UNIT/ML ~~LOC~~ SOLN
0.0000 [IU] | Freq: Three times a day (TID) | SUBCUTANEOUS | Status: DC
  Administered 2019-07-02: 18:00:00 20 [IU] via SUBCUTANEOUS
  Administered 2019-07-03: 11 [IU] via SUBCUTANEOUS
  Administered 2019-07-03 – 2019-07-04 (×2): 7 [IU] via SUBCUTANEOUS
  Administered 2019-07-04: 4 [IU] via SUBCUTANEOUS
  Administered 2019-07-04: 11 [IU] via SUBCUTANEOUS
  Administered 2019-07-05 – 2019-07-06 (×2): 7 [IU] via SUBCUTANEOUS
  Administered 2019-07-06 – 2019-07-07 (×2): 4 [IU] via SUBCUTANEOUS
  Administered 2019-07-07 – 2019-07-08 (×2): 3 [IU] via SUBCUTANEOUS
  Administered 2019-07-08: 4 [IU] via SUBCUTANEOUS
  Administered 2019-07-10: 3 [IU] via SUBCUTANEOUS

## 2019-07-02 MED ORDER — ACETAMINOPHEN 325 MG PO TABS: 650.0000 mg | ORAL_TABLET | ORAL | Status: DC | PRN

## 2019-07-02 MED ORDER — VANCOMYCIN HCL 10 G IV SOLR
1500.0000 mg | INTRAVENOUS | Status: AC
  Filled 2019-07-02: qty 1500

## 2019-07-02 MED ORDER — INSULIN ASPART 100 UNIT/ML ~~LOC~~ SOLN
12.0000 [IU] | Freq: Three times a day (TID) | SUBCUTANEOUS | Status: DC
  Administered 2019-07-02: 12 [IU] via SUBCUTANEOUS

## 2019-07-02 MED ORDER — DIPHENHYDRAMINE HCL 50 MG/ML IJ SOLN
25.0000 mg | Freq: Once | INTRAMUSCULAR | Status: AC
  Administered 2019-07-02: 07:00:00 25 mg via INTRAVENOUS
  Filled 2019-07-02: qty 1

## 2019-07-02 MED ORDER — SODIUM CHLORIDE 0.9% FLUSH
3.0000 mL | Freq: Two times a day (BID) | INTRAVENOUS | Status: DC
  Administered 2019-07-02 – 2019-07-09 (×6): 3 mL via INTRAVENOUS

## 2019-07-02 MED ORDER — METOPROLOL SUCCINATE ER 25 MG PO TB24
25.0000 mg | ORAL_TABLET | Freq: Two times a day (BID) | ORAL | Status: DC
  Administered 2019-07-02 – 2019-07-10 (×15): 25 mg via ORAL
  Filled 2019-07-02 (×15): qty 1

## 2019-07-02 MED ORDER — SODIUM CHLORIDE 0.9% FLUSH: 3.0000 mL | INTRAVENOUS | Status: DC | PRN

## 2019-07-02 MED ORDER — FENTANYL CITRATE (PF) 100 MCG/2ML IJ SOLN
INTRAMUSCULAR | Status: AC
  Filled 2019-07-02: qty 2

## 2019-07-02 MED ORDER — LABETALOL HCL 5 MG/ML IV SOLN: 10.0000 mg | INTRAVENOUS | Status: DC | PRN

## 2019-07-02 MED ORDER — HEPARIN (PORCINE) IN NACL 1000-0.9 UT/500ML-% IV SOLN
INTRAVENOUS | Status: DC | PRN
  Administered 2019-07-02 (×2): 500 mL

## 2019-07-02 MED ORDER — INSULIN ASPART 100 UNIT/ML ~~LOC~~ SOLN
10.0000 [IU] | Freq: Once | SUBCUTANEOUS | Status: AC
  Administered 2019-07-02: 10 [IU] via SUBCUTANEOUS

## 2019-07-02 MED ORDER — INSULIN ASPART 100 UNIT/ML ~~LOC~~ SOLN
0.0000 [IU] | Freq: Every day | SUBCUTANEOUS | Status: DC
  Administered 2019-07-08: 2 [IU] via SUBCUTANEOUS

## 2019-07-02 MED ORDER — VANCOMYCIN HCL IN DEXTROSE 1-5 GM/200ML-% IV SOLN: 1000.0000 mg | INTRAVENOUS | Status: DC

## 2019-07-02 MED ORDER — MIDAZOLAM HCL 2 MG/2ML IJ SOLN
INTRAMUSCULAR | Status: AC
  Filled 2019-07-02: qty 2

## 2019-07-02 MED ORDER — DIPHENHYDRAMINE HCL 50 MG/ML IJ SOLN
INTRAMUSCULAR | Status: DC | PRN
  Administered 2019-07-02: 25 mg via INTRAVENOUS

## 2019-07-02 MED ORDER — HYDRALAZINE HCL 20 MG/ML IJ SOLN: 5.0000 mg | INTRAMUSCULAR | Status: DC | PRN

## 2019-07-02 MED ORDER — ONDANSETRON HCL 4 MG/2ML IJ SOLN: 4.0000 mg | Freq: Four times a day (QID) | INTRAMUSCULAR | Status: DC | PRN

## 2019-07-02 MED ORDER — HEPARIN SODIUM (PORCINE) 1000 UNIT/ML IJ SOLN
INTRAMUSCULAR | Status: DC | PRN
  Administered 2019-07-02: 11000 [IU] via INTRAVENOUS

## 2019-07-02 MED ORDER — INSULIN ASPART 100 UNIT/ML ~~LOC~~ SOLN
15.0000 [IU] | Freq: Once | SUBCUTANEOUS | Status: AC
  Administered 2019-07-02: 15 [IU] via SUBCUTANEOUS

## 2019-07-02 SURGICAL SUPPLY — 20 items
CATH ANGIO 5F BER2 100CM (CATHETERS) ×1 IMPLANT
CATH CROSS OVER TEMPO 5F (CATHETERS) ×1 IMPLANT
CATH CXI SUPP 2.6F 150 ST (CATHETERS) ×1 IMPLANT
CATH INFINITI VERT 5FR 125CM (CATHETERS) ×1 IMPLANT
CATH OMNI FLUSH 5F 65CM (CATHETERS) ×1 IMPLANT
CATH QUICKCROSS ANG SELECT (CATHETERS) ×1 IMPLANT
CLOSURE MYNX CONTROL 6F/7F (Vascular Products) ×1 IMPLANT
GUIDEWIRE ANGLED .035X150CM (WIRE) ×1 IMPLANT
KIT MICROPUNCTURE NIT STIFF (SHEATH) ×1 IMPLANT
KIT PV (KITS) ×3 IMPLANT
SHEATH HIGHFLEX ANSEL 6FRX55 (SHEATH) ×1 IMPLANT
SHEATH PINNACLE 5F 10CM (SHEATH) ×1 IMPLANT
SHEATH PINNACLE 6F 10CM (SHEATH) ×1 IMPLANT
SHEATH PROBE COVER 6X72 (BAG) ×1 IMPLANT
SYR MEDRAD MARK V 150ML (SYRINGE) ×1 IMPLANT
TRANSDUCER W/STOPCOCK (MISCELLANEOUS) ×3 IMPLANT
TRAY PV CATH (CUSTOM PROCEDURE TRAY) ×3 IMPLANT
WIRE BENTSON .035X145CM (WIRE) ×1 IMPLANT
WIRE G V18X300CM (WIRE) ×1 IMPLANT
WIRE ROSEN-J .035X260CM (WIRE) ×1 IMPLANT

## 2019-07-02 NOTE — Progress Notes (Signed)
PROGRESS NOTE  Joseph TETREAULT VVO:[id number] DOB: Aug 06, 1951 DOA: 06/26/2019 PCP: Orpah Melter, MD  HPI/Recap of past 24 hours: Joseph Hoffman  is a 68 y.o. male, with medical history significant ofhistory significant ofhypertension, diabetes mellitus, asthma,sCHFwith EF 45%, CAD, atrial fibrillation on Coumadin,AS, s/p ofAVR with mechanical valveon coumadin, OSA on CPAP, CKD stage III, depression with anxiety, who presents with shortness of breath, and chest pain, as well he reports worsening left great toe ulcer with surrounding cellulitis -Patient reports he started to complain of chest pressure over last 48 hours, accompanied by dyspnea, mainly upon exertion, he did take sublingual nitro, with improvement of his chest pain, but today reported did not have any significant event on his chest pain, patient with known mechanical aortic valve insufficiency, plan to have urgent cath by Dr. Claiborne Billings next week, for anticipated aortic valve replacement by CT surgery next month. -Patient was noted to have left lower extremity diabetic great toe ulcer, report is been going on for last 2 weeks, with some drainage, as well with surrounding cellulitis, he denies any fever, chills, he did not seek any medical care for it. - in ED patient was hypertensive, mildly tachypneic, retinae elevated at 2.1 from baseline 1.8, mild leukocytosis at 14 point 9K, high-sensitivity troponins were elevated at 96>99, INR supratherapeutic at 3.9, COVID-19 is negative, EKG showing A. fib, patient was seen by cardiology for chest pain, and hospitalist were requested to admit for multiple medical problems, with a presentation of infected diabetic ulcer, gangrenous left toe with cellulitis.  07/02/19: No new complaints this am. Reported 25 beats PVC and asymptomatic. LE aortogram planned today for critical limb ischemia of the left lower extremity with left great toe wound on 07/02/19. Surgeon:  Marty Heck, MD Procedure  Performed: 1.  Ultrasound-guided access of the right common femoral artery 2.  Aortogram 3.  Bilateral lower extremity arteriogram with runoff 4.  Mynx closure of the right common femoral artery  Possible left great toe amputation tomorrow.   Assessment/Plan: Active Problems:   OSA on CPAP   Chronic anticoagulation   AS (aortic stenosis)   Essential hypertension   Acute combined systolic and diastolic heart failure (HCC)   CKD (chronic kidney disease), stage III (HCC)   Acute on chronic systolic (congestive) heart failure (HCC)   Depression   Foot ulcer, left (HCC)   Aortic valve regurgitation   SOB (shortness of breath)   Aortic valve disease  Sepsis secondary to L great toe gangrenous diabetic ulcer with surrounding cellulitis Presented with leukocytosis with WBC 14 K, tachypnea with respiratory rate 31 Elevated CRP 9.5, sed rate 25 on 06/29/2019 Continue broad-spectrum IV antibiotics IV Vancomycin, IV Rocephin IV Flagyl discontinued since no evidence of osteomyelitis on x-ray Vascular surgery following.  Left lower extremity arterial Dopplers completed. Infectious disease following.  Continue recommendations per infectious disease.  Left great toe diabetic ulcer/gangrenous with surrounding cellulitis in the setting of PVD Mildly elevated CRP and ESR Continue broad-spectrum IV antibiotics empirically Vascular surgery and ID following Aortogram completed with run off today Possible left great toe amputation tomorrow.  AKI on CKD3 Baseline creatinine appears to be 1.36 with GFR of 52 Presented with creatinine of 2.19 with GFR of 29 Cr. 1.36 on 07/02/19 Received 1 dse of IV lasix 80 mg once Net I&O -5.2L since admit Avoid nephrotoxins Daily BMPs  Permanent A. fib with intermittent slow ventricular response Heart rate in the 50s Toprol xl and digoxin doses reduced by cardiology C/  to closely monitor on telemetry  Severe aortic insufficiency/RV dysfunction Status  post aortic valve replacement and awaiting redo aortic valve replacement which has been postponed Possible CABG at time of redo AVR  Resolved leukocytosis WBC noted to be trending up this morning 12K from 11K>>10K No leukocytosis wbc 9.9 Afebrile Blood cx x2 negative to date  Resolved acute hypervolemic hyponatremia, asymptomatic Sodium 129 from 136 yesterday Resolved Sodium level 136 on 07/02/19 Continue daily BMPs  Subtherapeutic INR INR today 1.4, off Coumadin Continue heparin drip due to possible procedure.  Acute on chronic combined diastolic and systolic CHF Presented with BNP greater than 600 Cardiology following Continue cardiac medications as recommended by cardiology Post right and left heart cath 06/29/19 Continue strict I's and O's and daily weight  Acute on chronic hypoxic respiratory failure likely secondary to acute on chronic combined CHF Improving Wean off O2 supplementation Self reports on home O2 supplementation only at night  Severe aortic regurgitation post mechanical AVR On Coumadin, held due to possible procedure  Chest pain, resolved   OSA, compliant Continue CPAP at night  Hyperlipidemia Intolerant to statin On fenofibrate, icosapent, and zetia     Code Status: Full code  Family Communication: We will call family if okay with the patient.  Disposition Plan: Discharge to home possibly in 1-2 days or when cardiology, vascular surgery and infectious disease sign off.  Consultants:  Cardiology  Vascular surgery  Infectious disease  Procedures:  Heart cath on 06/29/2019  Antimicrobials:  IV vancomycin, Rocephin, IV Flagyl  DVT prophylaxis: Heparin drip.   Objective: Vitals:   07/02/19 0910 07/02/19 1015 07/02/19 1030 07/02/19 1100  BP: 131/66 (!) 142/123 136/75 132/70  Pulse: 78 69 100 70  Resp: 16     Temp: 97.8 F (36.6 C)     TempSrc: Oral     SpO2: 94% 91% 92% 92%  Weight:      Height:        Intake/Output  Summary (Last 24 hours) at 07/02/2019 1127 Last data filed at 07/02/2019 1121 Gross per 24 hour  Intake 2044.25 ml  Output 3175 ml  Net -1130.75 ml   Filed Weights   06/30/19 0425 07/01/19 0533 07/02/19 0443  Weight: 115.9 kg 115.9 kg 117.1 kg    Exam:  . General: 68 y.o. year-old male WD WN NAD A&O  x3 . Cardiovascular:IRRR no rubs or gallops. No JVD or thyromegaly . Respiratory: CTA no wheezes or rales. Poor inspiratory efforts . Abdomen: Obese NT ND NBS . Musculoskeletal: Trace lower extremity edema . Psychiatry: Mood appropriate for condition and setting.   Data Reviewed: CBC: Recent Labs  Lab 06/28/19 0602 06/29/19 0311 06/29/19 1044 06/29/19 1050 06/30/19 0551 07/01/19 0515 07/02/19 0511  WBC 11.1* 12.2*  --   --  10.0 10.9* 9.9  NEUTROABS 7.8*  --   --   --   --   --   --   HGB 12.5* 12.8* 12.6* 12.2* 11.3* 11.9* 11.6*  HCT 40.6 39.9 37.0* 36.0* 35.7* 37.3* 37.5*  MCV 83.7 81.3  --   --  81.0 81.6 83.5  PLT 237 253  --   --  229 250 657   Basic Metabolic Panel: Recent Labs  Lab 06/28/19 0602 06/29/19 0311 06/29/19 1044 06/29/19 1050 06/30/19 0551 07/01/19 0515 07/02/19 0511  NA 134* 133* 136 136 129* 138 136  K 4.2 3.6 3.6 3.5 4.1 3.5 4.2  CL 97* 97*  --   --  97* 104 105  CO2 24 21*  --   --  _0 GLUCOSE 140* 98  --   --  353* 75 156*  BUN 45* 57*  --   --  52* 48* 44*  CREATININE 1.92* 1.96*  --   --  1.37* 1.56* 1.36*  CALCIUM 9.0 9.1  --   --  8.5* 8.8* 8.7*  MG  --   --   --   --   --   --  2.5*   GFR: Estimated Creatinine Clearance: 65.8 mL/min (A) (by C-G formula based on SCr of 1.36 mg/dL (H)). Liver Function Tests: No results for input(s): AST, ALT, ALKPHOS, BILITOT, PROT, ALBUMIN in the last 168 hours. No results for input(s): LIPASE, AMYLASE in the last 168 hours. No results for input(s): AMMONIA in the last 168 hours. Coagulation Profile: Recent Labs  Lab 06/28/19 1143 06/29/19 0311 06/30/19 0551 07/01/19 0515 07/02/19  0511  INR 2.4* 1.7* 1.4* 1.4* 1.4*   Cardiac Enzymes: No results for input(s): CKTOTAL, CKMB, CKMBINDEX, TROPONINI in the last 168 hours. BNP (last 3 results) No results for input(s): PROBNP in the last 8760 hours. HbA1C: No results for input(s): HGBA1C in the last 72 hours. CBG: Recent Labs  Lab 07/01/19 0632 07/01/19 1128 07/01/19 1700 07/01/19 2056 07/02/19 0617  GLUCAP 98 363* 365* 343* 144*   Lipid Profile: No results for input(s): CHOL, HDL, LDLCALC, TRIG, CHOLHDL, LDLDIRECT in the last 72 hours. Thyroid Function Tests: No results for input(s): TSH, T4TOTAL, FREET4, T3FREE, THYROIDAB in the last 72 hours. Anemia Panel: No results for input(s): VITAMINB12, FOLATE, FERRITIN, TIBC, IRON, RETICCTPCT in the last 72 hours. Urine analysis:    Component Value Date/Time   COLORURINE YELLOW 09/25/2012 1506   APPEARANCEUR CLEAR 09/25/2012 1506   LABSPEC 1.045 (H) 09/25/2012 1506   PHURINE 6.0 09/25/2012 1506   GLUCOSEU >1000 (A) 09/25/2012 1506   HGBUR NEGATIVE 09/25/2012 1506   BILIRUBINUR NEGATIVE 09/25/2012 1506   KETONESUR NEGATIVE 09/25/2012 1506   PROTEINUR NEGATIVE 09/25/2012 1506   UROBILINOGEN 0.2 09/25/2012 1506   NITRITE NEGATIVE 09/25/2012 1506   LEUKOCYTESUR NEGATIVE 09/25/2012 1506   Sepsis Labs: _1 (procalcitonin:4,lacticidven:4)  ) Recent Results (from the past 240 hour(s))  SARS Coronavirus 2 (CEPHEID - Performed in [place] hospital lab), Hosp Order     Status: None   Collection Time: 06/26/19  1:15 PM   Specimen: Nasopharyngeal Swab  Result Value Ref Range Status   SARS Coronavirus 2 NEGATIVE NEGATIVE Final    Comment: (NOTE) If result is NEGATIVE SARS-CoV-2 target nucleic acids are NOT DETECTED. The SARS-CoV-2 RNA is generally detectable in upper and lower  respiratory specimens during the acute phase of infection. The lowest  concentration of SARS-CoV-2 viral copies this assay can detect is 250  copies / mL. A negative result does  not preclude SARS-CoV-2 infection  and should not be used as the sole basis for treatment or other  patient management decisions.  A negative result may occur with  improper specimen collection / handling, submission of specimen other  than nasopharyngeal swab, presence of viral mutation(s) within the  areas targeted by this assay, and inadequate number of viral copies  (<250 copies / mL). A negative result must be combined with clinical  observations, patient history, and epidemiological information. If result is POSITIVE SARS-CoV-2 target nucleic acids are DETECTED. The SARS-CoV-2 RNA is generally detectable in upper and lower  respiratory specimens dur ing the acute phase of infection.  Positive  results  are indicative of active infection with SARS-CoV-2.  Clinical  correlation with patient history and other diagnostic information is  necessary to determine patient infection status.  Positive results do  not rule out bacterial infection or co-infection with other viruses. If result is PRESUMPTIVE POSTIVE SARS-CoV-2 nucleic acids MAY BE PRESENT.   A presumptive positive result was obtained on the submitted specimen  and confirmed on repeat testing.  While 2019 novel coronavirus  (SARS-CoV-2) nucleic acids may be present in the submitted sample  additional confirmatory testing may be necessary for epidemiological  and / or clinical management purposes  to differentiate between  SARS-CoV-2 and other Sarbecovirus currently known to infect humans.  If clinically indicated additional testing with an alternate test  methodology [phone]) is advised. The SARS-CoV-2 RNA is generally  detectable in upper and lower respiratory sp ecimens during the acute  phase of infection. The expected result is Negative. Fact Sheet for Patients:  StrictlyIdeas.no Fact Sheet for Healthcare Providers: BankingDealers.co.za This test is not yet approved or  cleared by the [place] FDA and has been authorized for detection and/or diagnosis of SARS-CoV-2 by FDA under an Emergency Use Authorization (EUA).  This EUA will remain in effect (meaning this test can be used) for the duration of the COVID-19 declaration under Section 564(b)(1) of the Act, 21 U.S.C. section 360bbb-3(b)(1), unless the authorization is terminated or revoked sooner. Performed at [facility], [place] [address], [place], [place] [address]   Culture, blood (routine x 2)     Status: None   Collection Time: 06/26/19  2:46 PM   Specimen: BLOOD  Result Value Ref Range Status   Specimen Description BLOOD RIGHT ANTECUBITAL  Final   Special Requests   Final    BOTTLES DRAWN AEROBIC AND ANAEROBIC Blood Culture results may not be optimal due to an excessive volume of blood received in culture bottles   Culture   Final    NO GROWTH 5 DAYS Performed at [facility], [place] [address], [place], [place] [address]    Report Status 07/01/2019 FINAL  Final  Culture, blood (routine x 2)     Status: None   Collection Time: 06/26/19  2:52 PM   Specimen: BLOOD LEFT FOREARM  Result Value Ref Range Status   Specimen Description BLOOD LEFT FOREARM  Final   Special Requests   Final    BOTTLES DRAWN AEROBIC AND ANAEROBIC Blood Culture adequate volume   Culture   Final    NO GROWTH 5 DAYS Performed at [facility], [place] [address], [place], [place] [address]    Report Status 07/01/2019 FINAL  Final  Surgical pcr screen     Status: None   Collection Time: 07/01/19 11:23 PM   Specimen: Nasal Mucosa; Nasal Swab  Result Value Ref Range Status   MRSA, PCR NEGATIVE NEGATIVE Final   Staphylococcus aureus NEGATIVE NEGATIVE Final    Comment: (NOTE) The Xpert SA Assay (FDA approved for NASAL specimens in patients 43 years of age and older), is one component of a comprehensive surveillance program. It is not intended to diagnose infection nor to guide or monitor treatment.  Performed at [facility], [place] [address], [place], [place] [address]       Studies: No results found.  Scheduled Meds: . cholecalciferol  1,000 Units Oral Daily  . digoxin  0.125 mg Oral QHS  . escitalopram  10 mg Oral QPM  . ezetimibe  10 mg Oral Daily  .  fenofibrate  160 mg Oral Daily  . furosemide  80 mg Intravenous Once  . hydrALAZINE  12.5 mg Oral BID  . Icosapent Ethyl  2 g Oral BID  . insulin aspart  0-15 Units Subcutaneous TID WC  . insulin aspart  0-5 Units Subcutaneous QHS  . insulin aspart  10 Units Subcutaneous TID WC  . insulin glargine  25 Units Subcutaneous BID  . isosorbide mononitrate  30 mg Oral Daily  . metoprolol succinate  25 mg Oral BID  . mometasone-formoterol  2 puff Inhalation BID  . multivitamin  1 tablet Oral Daily  . nutrition supplement (JUVEN)  1 packet Oral BID BM  . Ensure Max Protein  11 oz Oral Daily  . saccharomyces boulardii  250 mg Oral Q M,W,F  . senna-docusate  2 tablet Oral BID  . sodium chloride flush  3 mL Intravenous Q12H  . sodium chloride flush  3 mL Intravenous Q12H  . vitamin C  1,000 mg Oral QPM    Continuous Infusions: . sodium chloride 50 mL/hr at 06/30/19 0635  . sodium chloride 250 mL (07/01/19 0634)  . sodium chloride    . cefTRIAXone (ROCEPHIN)  IV 2 g (07/01/19 1515)  . heparin 1,750 Units/hr (07/01/19 1537)     LOS: 6 days     Kayleen Memos, MD [facility] Pager [phone]  If 7PM-7AM, please contact night-coverage [email or web address] Password [email or web address] 07/02/2019, 11:27 AM

## 2019-07-02 NOTE — Progress Notes (Signed)
PT Cancellation Note  Patient Details Name: KAELAN AMBLE MRN: [id number] DOB: [birth year]   Cancelled Treatment:    Reason Eval/Treat Not Completed: Patient at procedure or test/unavailable   Jolynn Bajorek B Eyal Greenhaw 07/02/2019, 7:36 AM Elwyn Reach, PT Acute Rehabilitation Services Pager: [phone] Office: [phone]

## 2019-07-02 NOTE — Care Management Important Message (Signed)
Important Message  Patient Details  Name: SHERRICK ARAKI MRN: [id number] Date of Birth: 01-11-1951   Medicare Important Message Given:  Yes     [name] 07/02/2019, 12:30 PM

## 2019-07-02 NOTE — Progress Notes (Signed)
Patient CBG is high no value show on CBG machine, B met drawn per protocol, blood glucose is 573 called by lab, also patient R. Arm with hematoma patient said it looks like is getting worst, MD notified, MD came to see patient, see new order in epic. Will continue to monitor the patient.

## 2019-07-02 NOTE — Op Note (Signed)
Patient name: Joseph Hoffman MRN: [id number] DOB: 24-Feb-1951 Sex: male  07/02/2019 Pre-operative Diagnosis: Critical limb ischemia of the left lower extremity with left great toe wound Post-operative diagnosis:  Same Surgeon:  Marty Heck, MD Procedure Performed: 1.  Ultrasound-guided access of the right common femoral artery 2.  Aortogram 3.  Bilateral lower extremity arteriogram with runoff 4.  Mynx closure of the right common femoral artery  Indications: Patient is a 68 year old male with acute on chronic biventricular combined heart failure with EF of 30 to 35%.  Vascular surgery was called for evaluation of the left first toe gangrenous changes.  Lower extremity Doppler studies were suboptimal given his heart failure.  He presents today for aortogram, lower extremity arteriogram, and possible intervention after risks and benefits were discussed.  Contrast: 70 mL  Findings:  Aortogram showed single patent renal arteries bilaterally.  Patient has no significant aortoiliac disease.  On the left side which is the side of interest, the patient has a patent common femoral, profunda, SFA, and above and below-knee popliteal artery with no flow-limiting stenosis.  He has severe tibial disease below the knee.  He has inline flow down to the foot through a very robust peroneal artery that is widely patent throughout its course.  Posterior tibial has a high takeoff and then occludes shortly after takeoff.  Its occluded through most of his segment and then there is a small diseased PT that reconstitutes at the foot off the peroneal distally.  The anterior tibial also is patent proximally but then occluded shortly after takeoff.  It also has a long segment chronic total occlusion.  There is a dorsalis pedis that reconstitutes at the foot off a collateral from the peroneal with retrograde filling of the AT.  Right lower extremity showed a patent common femoral, profunda, and SFA as well as  popliteal.  The tibials were not evaluated due to contrast timing in the right leg and patient movement.  Even though the patient does have inline flow down the left peroneal, I attempted to wire the anterior tibial to try and cross the chronic total occlusion even though this was a long segment occlusion.  Unfortunately after multiple attempts with vert catheter, ber-2 catheter, as well as quick cross select I could not wire the anterior tibial antegrade.  My wire kept going down the peroneal.  Patient had a very steep aortic bifurcation and as a result we had a very limited torqueability of the wires down in the tibials.   Procedure:  The patient was identified in the holding area and taken to room 8.  The patient was then placed supine on the table and prepped and draped in the usual sterile fashion.  A time out was called.  Ultrasound was used to evaluate the right common femoral artery.  It was patent .  A digital ultrasound image was acquired.  A micropuncture needle was used to access the right common femoral artery under ultrasound guidance.  An 018 wire was advanced without resistance and a micropuncture sheath was placed.  The 018 wire was removed and a benson wire was placed.  The micropuncture sheath was exchanged for a 5 french sheath.  An omniflush catheter was advanced over the wire to the level of L-1.  An abdominal angiogram was obtained.  Next the catheter was pulled down to the aortic bifurcation and bilateral lower extremity runoff was obtained with pertinent findings noted above.  It was evident that the patient had  severe tibial disease in the left lower extremity as documented above.  We attempted to get some better images using lateral foot shots to see what options he had.  Ultimately I attempted to cannulate the left iliac with the Omni catheter and Bentson wire but I could not and had to use a crossover catheter.  I then exchanged for a soft angled Glidewire to get my wire down into  the SFA for enough support given a steep bifurcation and to get my crossover catheter down into the left common femoral.  I then exchanged for a Bentson wire and put the Omni Flush catheter back in.  We then got lateral dedicated foot shot on the left with pertinent findings noted above.  He did have inline flow down the peroneal that was very robust and brisk.  He has severe tibial disease and I did like to try and get down the anterior tibial to see if I could cross the long chronic total occlusion.  At that point in time I used a Rosen wire and exchanged for a long 6 [place] Ansell sheath in the right groin over the aortic bifurcation.  Patient had a very very steep bifurcation as previously noted.  He was given 100 units/kg heparin.  I then used V 18 with a CXI catheter and guiding the across the SFA into the popliteal artery.  I used multiple different attempts to get into the anterior tibial unsuccessfully even after manipulating the wire and catheter as well as the configuration of the tip of the wire.  I exchanged for a multitude of catheters including a ber-2 catheter and even a quick cross select and vert catheter.  Ultimately had very limited torqueability of my wire and catheter down the left leg which I presume was related to such a steep bifurcation.  After a long time attempting antegrade cannulation of the anterior tibial, I elected to stop given that I was unsuccessful and I did not want to dissect the artery here given that he did have inline flow down the peroneal that was very robust.  Wires and catheters were removed.  A short 6 french sheath was placed in the right groin.  A mynx closure device was deployed.  Manual pressure was held to be taken to PACU in stable condition.  Plan: Patient has in-line flow down the left leg via the peroneal artery.  I think he should proceed with toe amputation tomorrow and there is no other plans for revascularization at this time.  He understands that there  may be risk that the toe amputation fails to heal and he requires higher level amputation.   Marty Heck, MD [facility] Office: [phone] Pager: [place]

## 2019-07-02 NOTE — Progress Notes (Signed)
Advanced Heart Failure Rounding Note   Subjective:    Underwent LE angio today. Unable to revascularize RLE further.   Denies CP or SOB. Diuretics on hold due to contrast load. Weight up another 3 pounds.  (7 pounds in 3 days)  Objective:   Weight Range:  Vital Signs:   Temp:  [97.6 F (36.4 C)-98 F (36.7 C)] 97.8 F (36.6 C) (07/30 0910) Pulse Rate:  [57-88] 78 (07/30 0910) Resp:  [16-20] 16 (07/30 0910) BP: (113-136)/(48-98) 131/66 (07/30 0910) SpO2:  [94 %-100 %] 94 % (07/30 0910) Weight:  [117.1 kg] 117.1 kg (07/30 0443) Last BM Date: 07/01/19  Weight change: Filed Weights   06/30/19 0425 07/01/19 0533 07/02/19 0443  Weight: 115.9 kg 115.9 kg 117.1 kg    Intake/Output:   Intake/Output Summary (Last 24 hours) at 07/02/2019 0952 Last data filed at 07/02/2019 0710 Gross per 24 hour  Intake 2044.25 ml  Output 3451 ml  Net -1406.75 ml     Physical Exam: General:  Sitting up in bed No resp difficulty HEENT: normal Neck: supple. JVP to jawCarotids 2+ bilat; no bruits. No lymphadenopathy or thryomegaly appreciated. Cor: PMI nondisplaced. Irregular rate & rhythm. No rubs, gallops or murmurs. Lungs: clear Abdomen: soft, nontender, nondistended. No hepatosplenomegaly. No bruits or masses. Good bowel sounds. Extremities: no cyanosis, clubbing, rash, wound on L great toe Neuro: alert & orientedx3, cranial nerves grossly intact. moves all 4 extremities w/o difficulty. Affect pleasant    Telemetry:  AF 70-80ssPersonally reviewed   Labs: Basic Metabolic Panel: Recent Labs  Lab 06/28/19 0602 06/29/19 0311 06/29/19 1044 06/29/19 1050 06/30/19 0551 07/01/19 0515 07/02/19 0511  NA 134* 133* 136 136 129* 138 136  K 4.2 3.6 3.6 3.5 4.1 3.5 4.2  CL 97* 97*  --   --  97* 104 105  CO2 24 21*  --   --  22 24 22   GLUCOSE 140* 98  --   --  353* 75 156*  BUN 45* 57*  --   --  52* 48* 44*  CREATININE 1.92* 1.96*  --   --  1.37* 1.56* 1.36*  CALCIUM 9.0 9.1  --   --   8.5* 8.8* 8.7*  MG  --   --   --   --   --   --  2.5*    Liver Function Tests: No results for input(s): AST, ALT, ALKPHOS, BILITOT, PROT, ALBUMIN in the last 168 hours. No results for input(s): LIPASE, AMYLASE in the last 168 hours. No results for input(s): AMMONIA in the last 168 hours.  CBC: Recent Labs  Lab 06/28/19 0602 06/29/19 0311 06/29/19 1044 06/29/19 1050 06/30/19 0551 07/01/19 0515 07/02/19 0511  WBC 11.1* 12.2*  --   --  10.0 10.9* 9.9  NEUTROABS 7.8*  --   --   --   --   --   --   HGB 12.5* 12.8* 12.6* 12.2* 11.3* 11.9* 11.6*  HCT 40.6 39.9 37.0* 36.0* 35.7* 37.3* 37.5*  MCV 83.7 81.3  --   --  81.0 81.6 83.5  PLT 237 253  --   --  229 250 245    Cardiac Enzymes: No results for input(s): CKTOTAL, CKMB, CKMBINDEX, TROPONINI in the last 168 hours.  BNP: BNP (last 3 results) Recent Labs    05/12/19 1426 05/28/19 0617 06/26/19 1315  BNP 297.0* 342.8* 669.2*    ProBNP (last 3 results) No results for input(s): PROBNP in the last 8760 hours.  Other results:  Imaging: Dg Foot Complete Left  Result Date: 06/30/2019 CLINICAL DATA:  Osteomyelitis of left great toe and foot. Hx of diabetes, CHF, arthritis. EXAM: LEFT FOOT - COMPLETE 3+ VIEW COMPARISON:  None. FINDINGS: Soft tissue ulceration noted along the medial margin of the great toe at and proximal to the level of the IP joint. No bone resorption or focal osteopenia to suggest osteomyelitis. No fractures.  No dislocation. There is narrowing of the interphalangeal joints consistent with mild osteoarthritis. Mild degenerative spurring is noted along the dorsal aspect the intertarsal articulations. Ossification is noted along plantar fascia and there are calcifications along the arteries of the ankle and foot. IMPRESSION: 1. No fracture. 2. No radiographic evidence of osteomyelitis. 3. Also along the medial margin of the great toe. Electronically Signed   By: Lajean Manes M.D.   On: 06/30/2019 19:21   Vas US  Doppler Pre Cabg  Result Date: 06/30/2019 PREOPERATIVE VASCULAR EVALUATION  Indications:  Pre CABG. Ulceration. Risk Factors: Hypertension, hyperlipidemia, Diabetes, coronary artery disease,               PAD. Limitations:  Cardiac arrythmia, Respiratory interference Performing Technologist: Birdena Crandall, [place] RVS Supporting Technologist: June Leap RDMS, RVT  Examination Guidelines: A complete evaluation includes B-mode imaging, spectral Doppler, color Doppler, and power Doppler as needed of all accessible portions of each vessel. Bilateral testing is considered an integral part of a complete examination. Limited examinations for reoccurring indications may be performed as noted.  Right Carotid Findings: +----------+--------+-------+--------+------------+----------------------------+             PSV cm/s EDV     Stenosis Describe     Comments                                           cm/s                                                        +----------+--------+-------+--------+------------+----------------------------+  CCA Prox   110      1                             mild intimal wall changes     +----------+--------+-------+--------+------------+----------------------------+  CCA Distal 110      1                             mild intimal wall changes     +----------+--------+-------+--------+------------+----------------------------+  ICA Prox   90       11      1-39%    heterogenous mild plaque                   +----------+--------+-------+--------+------------+----------------------------+  ICA Mid    46       9                             tortuous                      +----------+--------+-------+--------+------------+----------------------------+  ICA Distal 84  24                            tortuous                      +----------+--------+-------+--------+------------+----------------------------+  ECA        205      4                heterogenous moderate plaque at the                                                            origin                        +----------+--------+-------+--------+------------+----------------------------+ Portions of this table do not appear on this page. +----------+--------+-------+--------+------------+             PSV cm/s EDV cms Describe Arm Pressure  +----------+--------+-------+--------+------------+  Subclavian 94                        158           +----------+--------+-------+--------+------------+ +---------+--------+--+--------+-+  Vertebral PSV cm/s 53 EDV cm/s 5  +---------+--------+--+--------+-+ Left Carotid Findings: +----------+--------+--------+--------+--------+------------------------------+             PSV cm/s EDV cm/s Stenosis Describe Comments                        +----------+--------+--------+--------+--------+------------------------------+  CCA Prox   80       1                          mild intimal wall changes       +----------+--------+--------+--------+--------+------------------------------+  CCA Distal 96       2                          mild intimal wall changes       +----------+--------+--------+--------+--------+------------------------------+  ICA Prox                     Occluded          plaque with acoustic shadowing  +----------+--------+--------+--------+--------+------------------------------+  ICA Mid                                        no flow noted                   +----------+--------+--------+--------+--------+------------------------------+  ICA Distal                                     no flow noted                   +----------+--------+--------+--------+--------+------------------------------+  ECA        126  difficult to image              +----------+--------+--------+--------+--------+------------------------------+ +----------+--------+--------+--------+------------+  Subclavian PSV cm/s EDV cm/s Describe Arm Pressure  +----------+--------+--------+--------+------------+              139                        132           +----------+--------+--------+--------+------------+ Reversed flow just distal to the bulb occluded ICA ABI Findings: +--------+------------------+-----+---------+--------+  Right    Rt Pressure (mmHg) Index Waveform  Comment   +--------+------------------+-----+---------+--------+  Brachial 158                      triphasic           +--------+------------------+-----+---------+--------+ +--------+------------------+-----+--------+----------------+  Left     Lt Pressure (mmHg) Index Waveform Comment           +--------+------------------+-----+--------+----------------+  Brachial 132                      biphasic sounds triphasic  +--------+------------------+-----+--------+----------------+ +-------+---------------+----------------+  ABI/TBI Today's ABI/TBI Previous ABI/TBI  +-------+---------------+----------------+  Right                   Puckett/0.37           +-------+---------------+----------------+  Left                    0.85./0.73        +-------+---------------+----------------+ ABI/TBI exam completed 06/26/2019.  Right Doppler Findings: +-----------+--------+-----+---------+--------+  Site        Pressure Index Doppler   Comments  +-----------+--------+-----+---------+--------+  Brachial    158            triphasic           +-----------+--------+-----+---------+--------+  Radial                     triphasic           +-----------+--------+-----+---------+--------+  Ulnar                      triphasic           +-----------+--------+-----+---------+--------+  Palmar Arch                          normal    +-----------+--------+-----+---------+--------+  Left Doppler Findings: +-----------+--------+-----+---------+----------------+  Site        Pressure Index Doppler   Comments          +-----------+--------+-----+---------+----------------+  Brachial    132            biphasic  sounds triphasic  +-----------+--------+-----+---------+----------------+  Radial                      triphasic                   +-----------+--------+-----+---------+----------------+  Ulnar                      triphasic                   +-----------+--------+-----+---------+----------------+  Palmar Arch                          normal            +-----------+--------+-----+---------+----------------+  Summary: Right Carotid: Velocities in the right ICA are consistent with a 1-39% stenosis. 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 Vertebrals:  Bilateral vertebral arteries demonstrate antegrade flow. Subclavians: Normal flow hemodynamics were seen in bilateral subclavian              arteries. Right ABI: Resting right ankle-brachial index indicates noncompressible right lower extremity arteries.The right toe-brachial index is abnormal. Left ABI: Resting left ankle-brachial index indicates mild left lower extremity arterial disease. The left toe-brachial index is abnormal. ABI may be falsely elevated. Right Upper Extremity: Doppler waveforms remain within normal limits with right radial compression. Doppler waveforms remain within normal limits with right ulnar compression. Left Upper Extremity: Doppler waveforms remain within normal limits with left radial compression. Doppler waveforms remain within normal limits with left ulnar compression.  Electronically signed by Deitra Mayo MD on 06/30/2019 at 5:55:59 PM.    Final    Vas [place] Lower Extremity Arterial Duplex  Result Date: 06/30/2019 LOWER EXTREMITY ARTERIAL DUPLEX STUDY Indications: Ulceration, and peripheral artery disease. High Risk Factors: Hypertension, hyperlipidemia, Diabetes. Other Factors: Permanent A-Fib,KD stage 3, Chronic combimed CHF,.  Current ABI: 06/26/2019 RT [place] 0.85 Limitations: Constant drawing of the leg when imaging the lower leg, Cardiac              arrythmia Comparison Study: No imaging comparison available only ABI/TBI exam of                   06/26/2019 Performing  Technologist: Toma Copier RVS  Examination Guidelines: A complete evaluation includes B-mode imaging, spectral Doppler, color Doppler, and power Doppler as needed of all accessible portions of each vessel. Bilateral testing is considered an integral part of a complete examination. Limited examinations for reoccurring indications may be performed as noted.  +--------+--------+-----+--------+--------+--------------+  RIGHT    PSV cm/s Ratio Stenosis Waveform Comments        +--------+--------+-----+--------+--------+--------------+  CFA Prox 86                      biphasic minimal plaque  +--------+--------+-----+--------+--------+--------------+  +----------+--------+-----+--------+---------+--------------+  LEFT       PSV cm/s Ratio Stenosis Waveform  Comments        +----------+--------+-----+--------+---------+--------------+  CFA Prox   105                     biphasic                  +----------+--------+-----+--------+---------+--------------+  CFA Distal 145                     biphasic  minimal plaque  +----------+--------+-----+--------+---------+--------------+  DFA        181                     biphasic  mild plaque     +----------+--------+-----+--------+---------+--------------+  SFA Prox   164                     biphasic  mild plaque     +----------+--------+-----+--------+---------+--------------+  SFA Mid    120                     biphasic  mild plaque     +----------+--------+-----+--------+---------+--------------+  SFA Distal 125                     triphasic  mild plaque     +----------+--------+-----+--------+---------+--------------+  POP Prox   108                     biphasic  miild plaque    +----------+--------+-----+--------+---------+--------------+  POP Distal 83                      biphasic  mild plaque     +----------+--------+-----+--------+---------+--------------+  ATA Distal 29                                                 +----------+--------+-----+--------+---------+--------------+  PTA Mid    56                      biphasic  mild plaque     +----------+--------+-----+--------+---------+--------------+  PTA Distal 148                     biphasic  mild plaque     +----------+--------+-----+--------+---------+--------------+ Technically difficult to evaluate due to cardiac arrythmia and constant drawing of the lower leg. There is mild irregular plaque throughout. Unable to note a significant stenosis throughout.  Summary: See table(s) above for measurements and observations. Electronically signed by Deitra Mayo MD on 06/30/2019 at 5:54:26 PM.    Final      Medications:     Scheduled Medications:  cholecalciferol  1,000 Units Oral Daily   digoxin  0.125 mg Oral QHS   escitalopram  10 mg Oral QPM   ezetimibe  10 mg Oral Daily   fenofibrate  160 mg Oral Daily   hydrALAZINE  12.5 mg Oral BID   Icosapent Ethyl  2 g Oral BID   insulin aspart  0-15 Units Subcutaneous TID WC   insulin aspart  0-5 Units Subcutaneous QHS   insulin aspart  10 Units Subcutaneous TID WC   insulin glargine  25 Units Subcutaneous BID   isosorbide mononitrate  30 mg Oral Daily   metoprolol succinate  37.5 mg Oral BID   mometasone-formoterol  2 puff Inhalation BID   multivitamin  1 tablet Oral Daily   nutrition supplement (JUVEN)  1 packet Oral BID BM   Ensure Max Protein  11 oz Oral Daily   saccharomyces boulardii  250 mg Oral Q M,W,F   senna-docusate  2 tablet Oral BID   sodium chloride flush  3 mL Intravenous Q12H   sodium chloride flush  3 mL Intravenous Q12H   vitamin C  1,000 mg Oral QPM    Infusions:  sodium chloride 50 mL/hr at 06/30/19 5170   sodium chloride 250 mL (07/01/19 0634)   sodium chloride     cefTRIAXone (ROCEPHIN)  IV 2 g (07/01/19 1515)   heparin 1,750 Units/hr (07/01/19 1537)    PRN Medications: sodium chloride, sodium chloride, acetaminophen, albuterol,  guaiFENesin-dextromethorphan, hydrALAZINE, HYDROmorphone (DILAUDID) injection, labetalol, loperamide, nitroGLYCERIN, ondansetron (ZOFRAN) IV, oxyCODONE, sodium chloride flush, sodium chloride flush  Allergies  Allergen Reactions   Iohexol Anaphylaxis   Niacin And Related     Flushing with immediate realese   Penicillins Other (See Comments)    Unknown.[name]  Did it involve swelling of the face/tongue/throat, SOB, or low BP? Unknown Did it involve sudden or severe rash/hives, skin peeling, or any reaction on the inside of your mouth or nose? Unknown  Did you need to seek medical attention at a hospital or doctor's office? Unknown When did it last happen?Childhood If all above answers are NO, may proceed with cephalosporin use.     Assessment/Plan:   1. Acute on Chronic Biventricular Heart Failure  - ECHO 05/2019 EF 30-35% Severe AI, severe RV dysfunction - Volume up in setting of holding diuretics for contrast exposure - Will give lasix 80IV this afternoon and follow creatinine. If creatinine stable in am would give another dose of IV lasix - On Toprol and digoxin. No ACE/ARB/ARNI with A/CKD for now  2. Chest Pain, CAD - LHC 06/29/19: Multivessel coronary disease, 50% mid LAD, 80% large OM2, 50% mid RCA, and 80-90% ostial rPDA stenoses.  - no ischemic s/s today - intolerant of statins. On Vascepa & zetia - Possible CABG at time of re-do AVR - postponed due to toe infection  3. Severe Aortic insuffiency in setting of mechanical AVR - H/O Mechanical AVR 2014 [facility]  - with DM2 wound question possible endocarditis however no obvious vegetation on TEE and I went back and looked at echos from past year and AI was present then so doubt acute endocarditis  - pending re-do high-risk AVR +/- CABG when other medical issues stable. Spoke with TCTS yesterday and will postpone planned surgery for now   4. L foot Diabetic Foot Ulcer - Bcx negative - on broad  spectrum abx. - VVS with LE angio today. Unable to revascularize. Plan L great toe amputation tomorrow - ID following. Appreciate recs. Need to discuss duration of therapy prior to re-do AVR  5. Chronic A fib  - Rate controlled on Toprol XL and digoxin. Doses cut back due to bradycardia  6. OSA - Needs CPAP every night.   7. Diabetes  8. CKD Stage IV  - Creatinine baseline ~2.  - stable at 1.4 today - management of diuretics as above  9. Bradycardia, nighttime - dig level 0.3 - metoprolol cut down to 25 bid  I will be out of town for the next few days. Will turn the reigns back over to general service. If he is still in house I will see again when I get back Monday.   Would try to get weight back to 249-251 prior to d/c. If BP tolerates and creatinine less than 1.8 would aim to get back on Entresto 24/26 bid (can stop hydral/ntg) prior to d/c. Would recommend torsemide 40 am/20 pm at time of discharge.   I will arrange f/u in HF Clinic.    Length of Stay: 6   Glori Bickers MD 07/02/2019, 9:52 AM  Advanced Heart Failure Team Pager [phone] (M-F; 7a - 4p)  Please contact [facility] for night-coverage after hours (4p -7a ) and weekends on [email or web address]

## 2019-07-02 NOTE — Progress Notes (Addendum)
[place] for Heparin Indication: atrial fibrillation/mechanical AVR  Allergies  Allergen Reactions  . Iohexol Anaphylaxis  . Niacin And Related     Flushing with immediate realese  . Penicillins Other (See Comments)    Unknown.[name]  Did it involve swelling of the face/tongue/throat, SOB, or low BP? Unknown Did it involve sudden or severe rash/hives, skin peeling, or any reaction on the inside of your mouth or nose? Unknown Did you need to seek medical attention at a hospital or doctor's office? Unknown When did it last happen?Childhood If all above answers are "NO", may proceed with cephalosporin use.    Patient Measurements: Height: 5\' 11"  (180.3 cm) Weight: 258 lb 1.6 oz (117.1 kg)(scale a) IBW/kg (Calculated) : 75.3  Heparin dosing weight: 100 kg  Vital Signs: Temp: 97.8 F (36.6 C) (07/30 0910) Temp Source: Oral (07/30 0910) BP: 131/66 (07/30 0910) Pulse Rate: 78 (07/30 0910)  Labs: Recent Labs    06/30/19 0551 07/01/19 0515 07/02/19 0511  HGB 11.3* 11.9* 11.6*  HCT 35.7* 37.3* 37.5*  PLT 229 250 245  LABPROT 17.2* 16.6* 16.7*  INR 1.4* 1.4* 1.4*  HEPARINUNFRC 0.36 0.52 0.37  CREATININE 1.37* 1.56* 1.36*    Estimated Creatinine Clearance: 65.8 mL/min (A) (by C-G formula based on SCr of 1.36 mg/dL (H)).   Assessment: 20 YOM with history of Afib and mechanical AVR on Coumadin PTA.  Patient is s/p cath 06/29/19 and will need  redo AVR +/- CABG after foot infection resolved  - plan toe amputation 7/31  Heparin level 0.37 this am on heparin drip 1750 uts/hr.  No s/sx of bleeding or infusion issues. H/H stable plt stable at 229.   Goal of Therapy:  Heparin level 0.3-0.7 units/ml  INR 2.5-3.5 Monitor platelets by anticoagulation protocol: Yes  Vanc AUC 400-550   Plan:  Heparin off post angiogram and plan for toe amputation tomorrow - f/u restart AC plan - pt with Mechanical valve Monitor s/sx of  bleeding/hematoma Follow up restart warfarin after toe amputation   Bonnita Nasuti Pharm.D. CPP, BCPS Clinical Pharmacist [phone] 07/02/2019 10:14 AM

## 2019-07-02 NOTE — Progress Notes (Signed)
Results for CLEOPHAS, YOAK (MRN [id number]) as of 07/02/2019 11:37  Ref. Range 07/01/2019 06:32 07/01/2019 11:28 07/01/2019 17:00 07/01/2019 20:56 07/02/2019 06:17  Glucose-Capillary Latest Ref Range: 70 - 99 mg/dL 98 363 (H) 365 (H) 343 (H) 144 (H)  Noted that patient's fasting blood sugars have been 98-144 mg/dl.  Noted that postprandials have been greater than 300 mg/dl.  Recommend increasing Novolog correction scale to RESISTANT TID & HS  and increase Novolog meal coverage to 12 units TID.  May need to titrate dosages as needed.   Harvel Ricks RN BSN CDE Diabetes Coordinator Pager: [phone]  8am-5pm

## 2019-07-02 NOTE — Anesthesia Preprocedure Evaluation (Addendum)
Anesthesia Evaluation  Patient identified by MRN, date of birth, ID band Patient awake    Reviewed: Allergy & Precautions, NPO status , Patient's Chart, lab work & pertinent test results  Airway Mallampati: II  TM Distance: >3 FB Neck ROM: Full    Dental  (+) Teeth Intact, Dental Advisory Given   Pulmonary asthma , sleep apnea and Continuous Positive Airway Pressure Ventilation , former smoker,    Pulmonary exam normal breath sounds clear to auscultation       Cardiovascular hypertension, Pt. on home beta blockers and Pt. on medications (-) angina+ CAD, + Past MI and +CHF  (-) Cardiac Stents + dysrhythmias Atrial Fibrillation + Valvular Problems/Murmurs (s/p AVR) AS and AI  Rhythm:Regular Rate:Normal + Diastolic murmurs    Neuro/Psych PSYCHIATRIC DISORDERS Anxiety Depression negative neurological ROS     GI/Hepatic negative GI ROS, Neg liver ROS,   Endo/Other  diabetes, Type 2, Insulin Dependent, Oral Hypoglycemic Agents  Renal/GU Renal InsufficiencyRenal disease     Musculoskeletal  (+) Arthritis , left great toe nonviable tissue   Abdominal   Peds  Hematology  (+) Blood dyscrasia (Xarelto), ,   Anesthesia Other Findings Day of surgery medications reviewed with the patient.  Reproductive/Obstetrics                            Anesthesia Physical Anesthesia Plan  ASA: IV  Anesthesia Plan: Regional   Post-op Pain Management:    Induction: Intravenous  PONV Risk Score and Plan: 1 and Propofol infusion and Treatment may vary due to age or medical condition  Airway Management Planned: Natural Airway and Nasal Cannula  Additional Equipment:   Intra-op Plan:   Post-operative Plan:   Informed Consent: I have reviewed the patients History and Physical, chart, labs and discussed the procedure including the risks, benefits and alternatives for the proposed anesthesia with the patient or  authorized representative who has indicated his/her understanding and acceptance.     Dental advisory given  Plan Discussed with: CRNA  Anesthesia Plan Comments:        Anesthesia Quick Evaluation

## 2019-07-02 NOTE — Progress Notes (Signed)
CARDIAC REHAB PHASE I   Cardiac Rehab ordered for pre-op, surgery being held at this time. Will continue to follow the pt from a distance.  Rufina Falco, RN BSN 07/02/2019 2:55 PM

## 2019-07-02 NOTE — Progress Notes (Signed)
Pharmacy note: vancomycin   Patient for left great toe amputation tomorrow ordered pre-op vancomycin 1000mg  -wt= 117kg -Crcl ~ 65  Plan -Will adjust the pre-op dose to 1500 mg x1  Hildred Laser, PharmD Clinical Pharmacist **Pharmacist phone directory can now be found on [email or web address] ([email or web address]).  Listed under [place].

## 2019-07-02 NOTE — Progress Notes (Signed)
Patient heart rate ranging from upper 50s to low 70s.  37.5mg  metoprolol still scheduled for twice a day.  RN notified triad.  Triad stated to hold metoprolol for tonight.

## 2019-07-02 NOTE — Progress Notes (Signed)
CCMD notified RN of 25 beat run PVC's.  RN notified triad.  Patient was asymptomatic.

## 2019-07-03 ENCOUNTER — Inpatient Hospital Stay (HOSPITAL_COMMUNITY): Payer: Medicare Other | Admitting: Anesthesiology

## 2019-07-03 ENCOUNTER — Ambulatory Visit: Payer: Self-pay | Admitting: *Deleted

## 2019-07-03 ENCOUNTER — Encounter (HOSPITAL_COMMUNITY)
Admission: EM | Disposition: A | Discharge status: Home / Self Care | Payer: Self-pay | Source: Home / Self Care | Attending: Internal Medicine

## 2019-07-03 DIAGNOSIS — Z89412 Acquired absence of left great toe: Secondary | ICD-10-CM

## 2019-07-03 HISTORY — PX: AMPUTATION: SHX166

## 2019-07-03 LAB — BASIC METABOLIC PANEL
Anion gap: 9 (ref 5–15)
BUN: 42 mg/dL — ABNORMAL HIGH (ref 8–23)
CO2: 22 mmol/L (ref 22–32)
Calcium: 9 mg/dL (ref 8.9–10.3)
Chloride: 103 mmol/L (ref 98–111)
Creatinine, Ser: 1.4 mg/dL — ABNORMAL HIGH (ref 0.61–1.24)
GFR calc Af Amer: 59 mL/min — ABNORMAL LOW (ref >60)
GFR calc non Af Amer: 51 mL/min — ABNORMAL LOW (ref >60)
Glucose, Bld: 313 mg/dL — ABNORMAL HIGH (ref 70–99)
Potassium: 4 mmol/L (ref 3.5–5.1)
Sodium: 134 mmol/L — ABNORMAL LOW (ref 135–145)

## 2019-07-03 LAB — GLUCOSE, CAPILLARY
Glucose-Capillary: 300 mg/dL — ABNORMAL HIGH (ref 70–99)
Glucose-Capillary: 226 mg/dL — ABNORMAL HIGH (ref 70–99)
Glucose-Capillary: 127 mg/dL — ABNORMAL HIGH (ref 70–99)
Glucose-Capillary: 189 mg/dL — ABNORMAL HIGH (ref 70–99)

## 2019-07-03 LAB — PROTIME-INR
INR: 1.2 (ref 0.8–1.2)
Prothrombin Time: 15.3 seconds — ABNORMAL HIGH (ref 11.4–15.2)

## 2019-07-03 LAB — CBC
HCT: 37 % — ABNORMAL LOW (ref 39.0–52.0)
Hemoglobin: 11.8 g/dL — ABNORMAL LOW (ref 13.0–17.0)
MCH: 26.5 pg (ref 26.0–34.0)
MCHC: 31.9 g/dL (ref 30.0–36.0)
MCV: 83.1 fL (ref 80.0–100.0)
Platelets: 265 10*3/uL (ref 150–400)
RBC: 4.45 MIL/uL (ref 4.22–5.81)
RDW: 17.3 % — ABNORMAL HIGH (ref 11.5–15.5)
WBC: 10.4 10*3/uL (ref 4.0–10.5)
nRBC: 0 % (ref 0.0–0.2)

## 2019-07-03 SURGERY — AMPUTATION DIGIT
Anesthesia: Regional | Site: Toe | Laterality: Left

## 2019-07-03 MED ORDER — FENTANYL CITRATE (PF) 100 MCG/2ML IJ SOLN
INTRAMUSCULAR | Status: DC | PRN
  Administered 2019-07-03 (×3): 25 ug via INTRAVENOUS

## 2019-07-03 MED ORDER — 0.9 % SODIUM CHLORIDE (POUR BTL) OPTIME
TOPICAL | Status: DC | PRN
  Administered 2019-07-03: 07:00:00 1000 mL

## 2019-07-03 MED ORDER — ONDANSETRON HCL 4 MG/2ML IJ SOLN: 4.0000 mg | Freq: Once | INTRAMUSCULAR | Status: DC | PRN

## 2019-07-03 MED ORDER — CEFAZOLIN SODIUM-DEXTROSE 2-3 GM-%(50ML) IV SOLR
INTRAVENOUS | Status: DC | PRN
  Administered 2019-07-03: 2 g via INTRAVENOUS

## 2019-07-03 MED ORDER — SUCCINYLCHOLINE CHLORIDE 200 MG/10ML IV SOSY
PREFILLED_SYRINGE | INTRAVENOUS | Status: AC
  Filled 2019-07-03: qty 10

## 2019-07-03 MED ORDER — INSULIN ASPART 100 UNIT/ML ~~LOC~~ SOLN
SUBCUTANEOUS | Status: AC
  Filled 2019-07-03: qty 1

## 2019-07-03 MED ORDER — LIDOCAINE 2% (20 MG/ML) 5 ML SYRINGE
INTRAMUSCULAR | Status: AC
  Filled 2019-07-03: qty 5

## 2019-07-03 MED ORDER — LIDOCAINE-EPINEPHRINE (PF) 1 %-1:200000 IJ SOLN
INTRAMUSCULAR | Status: AC
  Filled 2019-07-03: qty 30

## 2019-07-03 MED ORDER — PROPOFOL 1000 MG/100ML IV EMUL
INTRAVENOUS | Status: AC
  Filled 2019-07-03: qty 100

## 2019-07-03 MED ORDER — INSULIN GLARGINE 100 UNIT/ML ~~LOC~~ SOLN
35.0000 [IU] | Freq: Two times a day (BID) | SUBCUTANEOUS | Status: DC
  Filled 2019-07-03 (×2): qty 0.35

## 2019-07-03 MED ORDER — PROPOFOL 10 MG/ML IV BOLUS
INTRAVENOUS | Status: AC
  Filled 2019-07-03: qty 20

## 2019-07-03 MED ORDER — INSULIN GLARGINE 100 UNIT/ML ~~LOC~~ SOLN
35.0000 [IU] | Freq: Two times a day (BID) | SUBCUTANEOUS | Status: DC
  Administered 2019-07-03 – 2019-07-07 (×8): 35 [IU] via SUBCUTANEOUS
  Filled 2019-07-03 (×10): qty 0.35

## 2019-07-03 MED ORDER — INSULIN ASPART 100 UNIT/ML ~~LOC~~ SOLN
10.0000 [IU] | Freq: Once | SUBCUTANEOUS | Status: AC
  Administered 2019-07-03: 10 [IU] via SUBCUTANEOUS

## 2019-07-03 MED ORDER — BUPIVACAINE-EPINEPHRINE (PF) 0.5% -1:200000 IJ SOLN
INTRAMUSCULAR | Status: DC | PRN
  Administered 2019-07-03: 10 mL via PERINEURAL
  Administered 2019-07-03: 30 mL via PERINEURAL

## 2019-07-03 MED ORDER — FENTANYL CITRATE (PF) 250 MCG/5ML IJ SOLN
INTRAMUSCULAR | Status: AC
  Filled 2019-07-03: qty 5

## 2019-07-03 MED ORDER — INSULIN ASPART 100 UNIT/ML ~~LOC~~ SOLN
20.0000 [IU] | Freq: Three times a day (TID) | SUBCUTANEOUS | Status: DC
  Administered 2019-07-04 – 2019-07-10 (×19): 20 [IU] via SUBCUTANEOUS

## 2019-07-03 MED ORDER — LIDOCAINE HCL 1 % IJ SOLN
INTRAMUSCULAR | Status: DC | PRN
  Administered 2019-07-03: 20 mg via INTRADERMAL

## 2019-07-03 MED ORDER — ROCURONIUM BROMIDE 10 MG/ML (PF) SYRINGE
PREFILLED_SYRINGE | INTRAVENOUS | Status: AC
  Filled 2019-07-03: qty 10

## 2019-07-03 MED ORDER — LIDOCAINE-EPINEPHRINE (PF) 1 %-1:200000 IJ SOLN
INTRAMUSCULAR | Status: DC | PRN
  Administered 2019-07-03: 30 mL

## 2019-07-03 MED ORDER — FENTANYL CITRATE (PF) 100 MCG/2ML IJ SOLN: 25.0000 ug | INTRAMUSCULAR | Status: DC | PRN

## 2019-07-03 MED ORDER — PROPOFOL 10 MG/ML IV BOLUS
INTRAVENOUS | Status: DC | PRN
  Administered 2019-07-03: 10 mg via INTRAVENOUS
  Administered 2019-07-03: 20 mg via INTRAVENOUS
  Administered 2019-07-03 (×2): 10 mg via INTRAVENOUS
  Administered 2019-07-03 (×3): 20 mg via INTRAVENOUS
  Administered 2019-07-03: 10 mg via INTRAVENOUS
  Administered 2019-07-03: 20 mg via INTRAVENOUS

## 2019-07-03 MED ORDER — LACTATED RINGERS IV SOLN
INTRAVENOUS | Status: DC | PRN
  Administered 2019-07-03: 07:00:00 via INTRAVENOUS

## 2019-07-03 MED FILL — Midazolam HCl Inj 2 MG/2ML (Base Equivalent): INTRAMUSCULAR | Qty: 2 | Status: AC

## 2019-07-03 MED FILL — Fentanyl Citrate Preservative Free (PF) Inj 100 MCG/2ML: INTRAMUSCULAR | Qty: 2 | Status: AC

## 2019-07-03 SURGICAL SUPPLY — 40 items
BANDAGE ELASTIC 4 VELCRO ST LF (GAUZE/BANDAGES/DRESSINGS) ×3 IMPLANT
BNDG ELASTIC 4X5.8 VLCR STR LF (GAUZE/BANDAGES/DRESSINGS) ×3 IMPLANT
BNDG GAUZE ELAST 4 BULKY (GAUZE/BANDAGES/DRESSINGS) ×3 IMPLANT
CANISTER SUCT 3000ML PPV (MISCELLANEOUS) ×3 IMPLANT
COVER SURGICAL LIGHT HANDLE (MISCELLANEOUS) ×3 IMPLANT
COVER WAND RF STERILE (DRAPES) ×3 IMPLANT
DECANTER SPIKE VIAL GLASS SM (MISCELLANEOUS) ×3 IMPLANT
DRAPE EXTREMITY T 121X128X90 (DISPOSABLE) ×3 IMPLANT
DRAPE HALF SHEET 40X57 (DRAPES) ×3 IMPLANT
DRSG EMULSION OIL 3X3 NADH (GAUZE/BANDAGES/DRESSINGS) ×3 IMPLANT
ELECT REM PT RETURN 9FT ADLT (ELECTROSURGICAL) ×3
ELECTRODE REM PT RTRN 9FT ADLT (ELECTROSURGICAL) ×1 IMPLANT
GAUZE SPONGE 4X4 12PLY STRL (GAUZE/BANDAGES/DRESSINGS) ×3 IMPLANT
GAUZE SPONGE 4X4 12PLY STRL LF (GAUZE/BANDAGES/DRESSINGS) ×3 IMPLANT
GLOVE BIO SURGEON STRL SZ 6.5 (GLOVE) ×2 IMPLANT
GLOVE BIO SURGEON STRL SZ7.5 (GLOVE) ×3 IMPLANT
GLOVE BIO SURGEONS STRL SZ 6.5 (GLOVE) ×1
GLOVE BIOGEL PI IND STRL 6.5 (GLOVE) ×1 IMPLANT
GLOVE BIOGEL PI IND STRL 7.0 (GLOVE) ×1 IMPLANT
GLOVE BIOGEL PI IND STRL 8 (GLOVE) ×1 IMPLANT
GLOVE BIOGEL PI INDICATOR 6.5 (GLOVE) ×2
GLOVE BIOGEL PI INDICATOR 7.0 (GLOVE) ×2
GLOVE BIOGEL PI INDICATOR 8 (GLOVE) ×2
GOWN STRL REUS W/ TWL LRG LVL3 (GOWN DISPOSABLE) ×1 IMPLANT
GOWN STRL REUS W/ TWL XL LVL3 (GOWN DISPOSABLE) ×1 IMPLANT
GOWN STRL REUS W/TWL LRG LVL3 (GOWN DISPOSABLE) ×2
GOWN STRL REUS W/TWL XL LVL3 (GOWN DISPOSABLE) ×2
KIT BASIN OR (CUSTOM PROCEDURE TRAY) ×3 IMPLANT
KIT TURNOVER KIT B (KITS) ×3 IMPLANT
NEEDLE 27GAX1X1/2 (NEEDLE) ×3 IMPLANT
NS IRRIG 1000ML POUR BTL (IV SOLUTION) ×3 IMPLANT
PACK GENERAL/GYN (CUSTOM PROCEDURE TRAY) ×3 IMPLANT
PAD ARMBOARD 7.5X6 YLW CONV (MISCELLANEOUS) ×6 IMPLANT
SUT ETHILON 3 0 PS 1 (SUTURE) ×9 IMPLANT
SUT VIC AB 3-0 SH 27 (SUTURE)
SUT VIC AB 3-0 SH 27X BRD (SUTURE) IMPLANT
SYR CONTROL 10ML LL (SYRINGE) ×3 IMPLANT
TOWEL GREEN STERILE (TOWEL DISPOSABLE) ×6 IMPLANT
UNDERPAD 30X30 (UNDERPADS AND DIAPERS) ×3 IMPLANT
WATER STERILE IRR 1000ML POUR (IV SOLUTION) ×3 IMPLANT

## 2019-07-03 NOTE — Op Note (Signed)
Date: July 03, 2019  Preoperative diagnosis: Gangrenous left great toe  Postoperative diagnosis: Same   Procedure: Ray amputation of left great toe  Surgeon: Dr. Marty Heck, MD  Assistant: OR staff  Indications: Patient is a 68 year old male admitted with acute on chronic biventricular heart failure with a EF of 30 to 35% that vascular surgery was called for evaluation of a gangrenous left great toe.  Ultimately underwent arteriogram yesterday and has single-vessel peroneal runoff with severe tibial disease.  He presents today for toe amputation after risk and benefits were discussed including failure of toe amputation to heal.  Findings: Ray amputation of left great toe using a tennis racquet incision.  The underlying tissue appeared healthy and viable and was bleeding.  Bone cultures were sent from the metatarsal head for gram stain and culture.  Anesthesia: Regional  Details: The patient was taken to the operating room after informed consent was obtained.  He was placed on the operative table in the supine position.  His left leg and foot were then prepped and draped usual sterile fashion.  Preop timeout was performed to identify patient procedure and site.  Initially marked out a tennis racquet incision over the left great toe given that his gangrene extended down to the base of the toe.  Incision was then made with a 15 blade scalpel and carried down to the phalanges.  I then used a elevator and freed all the soft tissue all the way back to the head of the metatarsal where the joint was transected.  Specimen was then passed off the field.  I then used a rongeur and took cultures from the metatarsal head as well to take tension off the wound and shave the bone back.  I did subsequently remove the sesamoid bone as well.  All the tissue appeared viable and was bleeding.  The wound was copiously irrigated with saline.  I injected 10 mL of 1% lidocaine with epinephrine into the wound.  I  then used 3-0 nylon's in both horizontal and vertical mattress format to reapproximate the tissue.  Patient tolerated the procedure without any apparent complications.  Xeroform and dry sterile dressing was applied.  He was taken to PACU in stable condition.  Complication: None  Condition: Stable  Marty Heck, MD [facility] Office: [phone] Pager: [place]

## 2019-07-03 NOTE — Progress Notes (Signed)
PROGRESS NOTE  Joseph Hoffman GLO:[id number] DOB: 1951-03-14 DOA: 06/26/2019 PCP: Orpah Melter, MD  HPI/Recap of past 24 hours: Joseph Hoffman  is a 68 y.o. male, with medical history significant ofhistory significant ofhypertension, diabetes mellitus, asthma,sCHFwith EF 45%, CAD, atrial fibrillation on Coumadin,AS, s/p ofAVR with mechanical valveon coumadin, OSA on CPAP, CKD stage III, depression with anxiety, who presents with shortness of breath, and chest pain, as well he reports worsening left great toe ulcer with surrounding cellulitis -Patient reports he started to complain of chest pressure over last 48 hours, accompanied by dyspnea, mainly upon exertion, he did take sublingual nitro, with improvement of his chest pain, but today reported did not have any significant event on his chest pain, patient with known mechanical aortic valve insufficiency, plan to have urgent cath by Dr. Claiborne Billings next week, for anticipated aortic valve replacement by CT surgery next month. -Patient was noted to have left lower extremity diabetic great toe ulcer, report is been going on for last 2 weeks, with some drainage, as well with surrounding cellulitis, he denies any fever, chills, he did not seek any medical care for it. - in ED patient was hypertensive, mildly tachypneic, retinae elevated at 2.1 from baseline 1.8, mild leukocytosis at 14 point 9K, high-sensitivity troponins were elevated at 96>99, INR supratherapeutic at 3.9, COVID-19 is negative, EKG showing A. fib, patient was seen by cardiology for chest pain, and hospitalist were requested to admit for multiple medical problems, with a presentation of infected diabetic ulcer, gangrenous left toe with cellulitis.  07/02/19: No new complaints this am. Reported 25 beats PVC and asymptomatic. LE aortogram planned today for critical limb ischemia of the left lower extremity with left great toe wound on 07/02/19. Surgeon:  Marty Heck, MD Procedure  Performed: 1.  Ultrasound-guided access of the right common femoral artery 2.  Aortogram 3.  Bilateral lower extremity arteriogram with runoff 4.  Mynx closure of the right common femoral artery  Left great toe amputation 07/03/19.  07/03/19: Patient was seen and examined at his bedside this morning.  He had no acute complaints.  POD #0 post right amputation of left great toe by vascular surgery Dr. Carlis Abbott.   Assessment/Plan: Active Problems:   OSA on CPAP   Chronic anticoagulation   AS (aortic stenosis)   Essential hypertension   Acute combined systolic and diastolic heart failure (HCC)   CKD (chronic kidney disease), stage III (HCC)   Acute on chronic systolic (congestive) heart failure (HCC)   Depression   Foot ulcer, left (HCC)   Aortic valve regurgitation   SOB (shortness of breath)   Aortic valve disease  Sepsis secondary to L great toe gangrenous diabetic ulcer with surrounding cellulitis Presented with leukocytosis with WBC 14 K, tachypnea with respiratory rate 31 Elevated CRP 9.5, sed rate 25 on 06/29/2019 Continue broad-spectrum IV antibiotics IV Vancomycin, IV Rocephin IV Flagyl discontinued since no evidence of osteomyelitis on x-ray Vascular surgery following.  Left lower extremity arterial Dopplers completed. Infectious disease following.  Continue recommendations per infectious disease.  POD #0 post ray amputation of left great toe from left great toe diabetic ulcer/gangrenous with surrounding cellulitis in the setting of PVD VVS with LE angio on 07/02/2019. Unable to revascularize.  Ray amputation of left great toe on 07/03/2019 by vascular surgery Dr. Carlis Abbott Optimize pain control Optimize blood sugar control IV antibiotics as recommended by infectious disease. Defer to vascular surgery to restart anticoagulation.  Resolving AKI on CKD3 Baseline creatinine appears to  be 1.36 with GFR of 52 Presented with creatinine of 2.19 with GFR of 29 Creatinine 1.40 on  7/31 from 1.70 on 07/02/2019 Received 1 dse of IV lasix 80 mg once on 07/02/2019 Net I&O -6.7 L since admission Continue to avoid nephrotoxins Daily BMPs while on diuretics Continue to monitor urine output and volume status  Uncontrolled diabetes, type II with hyperglycemia Blood sugar ranging between 350 and 550 possibly stress related Hemoglobin A1c 8.7 on 05/13/2019 Insulin doses have been increased as stated below Continue resistant insulin sliding scale Continue Lantus 35 units twice daily Continue NovoLog 20 units before meals  Acute on chronic combined diastolic and systolic CHF Presented with BNP greater than 600 Cardiology following Continue cardiac medications as recommended by cardiology Post right and left heart cath 06/29/19 Continue strict I's and O's and daily weight Received 1 dose of IV Lasix 80 mg a.m. 07/02/2019 Renal function stable We will add another dose of IV Lasix 80 mg x1 as recommended by heart failure team.  Due to soft blood pressure will divide dose to 40 mg twice daily starting later this afternoon.  As recommended by heart failure team, try to get weight back to 249-251 pounds prior to d/c. If BP tolerates and creatinine less than 1.8 would aim to get back on Entresto 24/26 bid (can stop hydral/ntg) prior to d/c. Would recommend torsemide 40 am/20 pm at time of discharge.  Heart failure team will arrange f/u in HF Clinic.   Permanent A. fib with intermittent slow ventricular response Heart rate in the 50s Toprol xl and digoxin doses reduced by cardiology C/ to closely monitor on telemetry  Severe aortic insufficiency/RV dysfunction Status post aortic valve replacement and awaiting redo aortic valve replacement which has been postponed Possible CABG at time of redo AVR, postponed due to to infection.  Resolved leukocytosis WBC noted to be trending up this morning 12K from 11K>>10K No leukocytosis wbc 9.9 Afebrile Blood cx x2 negative to date   Resolving acute hypervolemic hyponatremia, asymptomatic Sodium 134 Resolving  Subtherapeutic INR INR today 1.2 Currently not on anticoagulation  Right upper extremity bruising with IV puncture Continue to apply ice as needed Minimal tenderness with palpation  Acute on chronic hypoxic respiratory failure likely secondary to acute on chronic combined CHF Improving Continue to wean off O2 supplementation Self reports on home O2 supplementation only at night Maintain O2 saturation greater than 92%  Severe aortic regurgitation post mechanical AVR On Coumadin, held due to possible procedure  Chest pain, resolved   OSA, compliant Continue CPAP at night  Hyperlipidemia Intolerant to statin On fenofibrate, icosapent, and zetia     Code Status: Full code  Family Communication: We will call family if okay with the patient.  Disposition Plan: Discharge to home possibly in 1-2 days or when cardiology, vascular surgery and infectious disease sign off.  Consultants:  Cardiology  Vascular surgery  Infectious disease  Procedures:  Heart cath on 06/29/2019  Antimicrobials:  IV vancomycin, Rocephin  IV Flagyl DC'd   DVT prophylaxis: SCDs post surgery.  Defer to vascular surgery to restart anticoagulation.   Objective: Vitals:   07/03/19 1230 07/03/19 1245 07/03/19 1300 07/03/19 1315  BP: 127/63 (!) 143/54 140/83 129/71  Pulse: (!) 53 (!) 50 (!) 57 (!) 47  Resp:      Temp:      TempSrc:      SpO2:      Weight:      Height:  Intake/Output Summary (Last 24 hours) at 07/03/2019 1333 Last data filed at 07/03/2019 0840 Gross per 24 hour  Intake 1940 ml  Output 3510 ml  Net -1570 ml   Filed Weights   07/01/19 0533 07/02/19 0443 07/03/19 0458  Weight: 115.9 kg 117.1 kg 116.2 kg    Exam:  . General: 68 y.o. year-old male obese in no acute distress.  Alert and oriented x3.   . Cardiovascular: Irregular rate and rhythm no rubs or gallops.  No JVD or  thyromegaly noted.   [name] Respiratory: Clear to auscultation no wheezes or rales.  Poor respiratory effort.   . Abdomen: Obese nontender nondistended normal bowel sounds present. . Musculoskeletal: Trace lower extremity edema.  Gangrenous left great toe prior to surgery. [name] Psychiatry: Mood is appropriate for condition and setting   Data Reviewed: CBC: Recent Labs  Lab 06/28/19 0602 06/29/19 0311  06/29/19 1050 06/30/19 0551 07/01/19 0515 07/02/19 0511 07/03/19 0536  WBC 11.1* 12.2*  --   --  10.0 10.9* 9.9 10.4  NEUTROABS 7.8*  --   --   --   --   --   --   --   HGB 12.5* 12.8*   < > 12.2* 11.3* 11.9* 11.6* 11.8*  HCT 40.6 39.9   < > 36.0* 35.7* 37.3* 37.5* 37.0*  MCV 83.7 81.3  --   --  81.0 81.6 83.5 83.1  PLT 237 253  --   --  229 250 245 265   < > = values in this interval not displayed.   Basic Metabolic Panel: Recent Labs  Lab 06/30/19 0551 07/01/19 0515 07/02/19 0511 07/02/19 1628 07/02/19 2145 07/03/19 0536  NA 129* 138 136 132*  --  134*  K 4.1 3.5 4.2 5.0  --  4.0  CL 97* 104 105 99  --  103  CO2 22 24 22 24   --  22  GLUCOSE 353* 75 156* 573* 582* 313*  BUN 52* 48* 44* 43*  --  42*  CREATININE 1.37* 1.56* 1.36* 1.70*  --  1.40*  CALCIUM 8.5* 8.8* 8.7* 8.7*  --  9.0  MG  --   --  2.5*  --   --   --    GFR: Estimated Creatinine Clearance: 63.7 mL/min (A) (by C-G formula based on SCr of 1.4 mg/dL (H)). Liver Function Tests: No results for input(s): AST, ALT, ALKPHOS, BILITOT, PROT, ALBUMIN in the last 168 hours. No results for input(s): LIPASE, AMYLASE in the last 168 hours. No results for input(s): AMMONIA in the last 168 hours. Coagulation Profile: Recent Labs  Lab 06/29/19 0311 06/30/19 0551 07/01/19 0515 07/02/19 0511 07/03/19 0536  INR 1.7* 1.4* 1.4* 1.4* 1.2   Cardiac Enzymes: No results for input(s): CKTOTAL, CKMB, CKMBINDEX, TROPONINI in the last 168 hours. BNP (last 3 results) No results for input(s): PROBNP in the last 8760 hours. HbA1C:  No results for input(s): HGBA1C in the last 72 hours. CBG: Recent Labs  Lab 07/02/19 1554 07/02/19 2114 07/02/19 2115 07/03/19 0607 07/03/19 1129  GLUCAP >600* >600* >600* 300* 226*   Lipid Profile: No results for input(s): CHOL, HDL, LDLCALC, TRIG, CHOLHDL, LDLDIRECT in the last 72 hours. Thyroid Function Tests: No results for input(s): TSH, T4TOTAL, FREET4, T3FREE, THYROIDAB in the last 72 hours. Anemia Panel: No results for input(s): VITAMINB12, FOLATE, FERRITIN, TIBC, IRON, RETICCTPCT in the last 72 hours. Urine analysis:    Component Value Date/Time   COLORURINE YELLOW 09/25/2012 [place]  09/25/2012 1506   LABSPEC 1.045 (H) 09/25/2012 1506   PHURINE 6.0 09/25/2012 1506   GLUCOSEU >1000 (A) 09/25/2012 1506   HGBUR NEGATIVE 09/25/2012 1506   BILIRUBINUR NEGATIVE 09/25/2012 1506   KETONESUR NEGATIVE 09/25/2012 1506   PROTEINUR NEGATIVE 09/25/2012 1506   UROBILINOGEN 0.2 09/25/2012 1506   NITRITE NEGATIVE 09/25/2012 1506   LEUKOCYTESUR NEGATIVE 09/25/2012 1506   Sepsis Labs: @LABRCNTIP (procalcitonin:4,lacticidven:4)  ) Recent Results (from the past 240 hour(s))  SARS Coronavirus 2 (CEPHEID - Performed in [place] hospital lab), Hosp Order     Status: None   Collection Time: 06/26/19  1:15 PM   Specimen: Nasopharyngeal Swab  Result Value Ref Range Status   SARS Coronavirus 2 NEGATIVE NEGATIVE Final    Comment: (NOTE) If result is NEGATIVE SARS-CoV-2 target nucleic acids are NOT DETECTED. The SARS-CoV-2 RNA is generally detectable in upper and lower  respiratory specimens during the acute phase of infection. The lowest  concentration of SARS-CoV-2 viral copies this assay can detect is 250  copies / mL. A negative result does not preclude SARS-CoV-2 infection  and should not be used as the sole basis for treatment or other  patient management decisions.  A negative result may occur with  improper specimen collection / handling, submission of  specimen other  than nasopharyngeal swab, presence of viral mutation(s) within the  areas targeted by this assay, and inadequate number of viral copies  (<250 copies / mL). A negative result must be combined with clinical  observations, patient history, and epidemiological information. If result is POSITIVE SARS-CoV-2 target nucleic acids are DETECTED. The SARS-CoV-2 RNA is generally detectable in upper and lower  respiratory specimens dur ing the acute phase of infection.  Positive  results are indicative of active infection with SARS-CoV-2.  Clinical  correlation with patient history and other diagnostic information is  necessary to determine patient infection status.  Positive results do  not rule out bacterial infection or co-infection with other viruses. If result is PRESUMPTIVE POSTIVE SARS-CoV-2 nucleic acids MAY BE PRESENT.   A presumptive positive result was obtained on the submitted specimen  and confirmed on repeat testing.  While 2019 novel coronavirus  (SARS-CoV-2) nucleic acids may be present in the submitted sample  additional confirmatory testing may be necessary for epidemiological  and / or clinical management purposes  to differentiate between  SARS-CoV-2 and other Sarbecovirus currently known to infect humans.  If clinically indicated additional testing with an alternate test  methodology [phone]) is advised. The SARS-CoV-2 RNA is generally  detectable in upper and lower respiratory sp ecimens during the acute  phase of infection. The expected result is Negative. Fact Sheet for Patients:  StrictlyIdeas.no Fact Sheet for Healthcare Providers: BankingDealers.co.za This test is not yet approved or cleared by the [place] FDA and has been authorized for detection and/or diagnosis of SARS-CoV-2 by FDA under an Emergency Use Authorization (EUA).  This EUA will remain in effect (meaning this test can be used) for the  duration of the COVID-19 declaration under Section 564(b)(1) of the Act, 21 U.S.C. section 360bbb-3(b)(1), unless the authorization is terminated or revoked sooner. Performed at [facility], [place] [address], [place],  [address]   Culture, blood (routine x 2)     Status: None   Collection Time: 06/26/19  2:46 PM   Specimen: BLOOD  Result Value Ref Range Status   Specimen Description BLOOD RIGHT ANTECUBITAL  Final   Special Requests   Final  BOTTLES DRAWN AEROBIC AND ANAEROBIC Blood Culture results may not be optimal due to an excessive volume of blood received in culture bottles   Culture   Final    NO GROWTH 5 DAYS Performed at [facility], [place] [address], [place], [place] [address]    Report Status 07/01/2019 FINAL  Final  Culture, blood (routine x 2)     Status: None   Collection Time: 06/26/19  2:52 PM   Specimen: BLOOD LEFT FOREARM  Result Value Ref Range Status   Specimen Description BLOOD LEFT FOREARM  Final   Special Requests   Final    BOTTLES DRAWN AEROBIC AND ANAEROBIC Blood Culture adequate volume   Culture   Final    NO GROWTH 5 DAYS Performed at [facility], [place] [address] [place], [place] [address]    Report Status 07/01/2019 FINAL  Final  Surgical pcr screen     Status: None   Collection Time: 07/01/19 11:23 PM   Specimen: Nasal Mucosa; Nasal Swab  Result Value Ref Range Status   MRSA, PCR NEGATIVE NEGATIVE Final   Staphylococcus aureus NEGATIVE NEGATIVE Final    Comment: (NOTE) The Xpert SA Assay (FDA approved for NASAL specimens in patients 76 years of age and older), is one component of a comprehensive surveillance program. It is not intended to diagnose infection nor to guide or monitor treatment. Performed at [facility], [place] [address] [place], [place] [address]       Studies: No results found.  Scheduled Meds: . cholecalciferol  1,000 Units Oral Daily  . digoxin  0.125 mg Oral QHS  . escitalopram  10 mg  Oral QPM  . ezetimibe  10 mg Oral Daily  . fenofibrate  160 mg Oral Daily  . hydrALAZINE  12.5 mg Oral BID  . Icosapent Ethyl  2 g Oral BID  . insulin aspart      . insulin aspart      . insulin aspart  0-20 Units Subcutaneous TID WC  . insulin aspart  0-5 Units Subcutaneous QHS  . insulin aspart  20 Units Subcutaneous TID WC  . insulin glargine  35 Units Subcutaneous BID  . isosorbide mononitrate  30 mg Oral Daily  . metoprolol succinate  25 mg Oral BID  . mometasone-formoterol  2 puff Inhalation BID  . multivitamin  1 tablet Oral Daily  . nutrition supplement (JUVEN)  1 packet Oral BID BM  . Ensure Max Protein  11 oz Oral Daily  . saccharomyces boulardii  250 mg Oral Q M,W,F  . senna-docusate  2 tablet Oral BID  . sodium chloride flush  3 mL Intravenous Q12H  . sodium chloride flush  3 mL Intravenous Q12H  . vitamin C  1,000 mg Oral QPM    Continuous Infusions: . sodium chloride 50 mL/hr at 06/30/19 0635  . sodium chloride 250 mL (07/01/19 0634)  . sodium chloride    . cefTRIAXone (ROCEPHIN)  IV 2 g (07/02/19 1559)  . vancomycin       LOS: 7 days     Kayleen Memos, MD [facility] Pager [phone]  If 7PM-7AM, please contact night-coverage [email or web address] Password [email or web address] 07/03/2019, 1:33 PM

## 2019-07-03 NOTE — Progress Notes (Signed)
Responded to consult to support patient. Patient was sleeping upon my visit.I prayed over patient and made nurse aware to pass on to patient.  Will follow as needed.  Jaclynn Major, [place], [facility], Pager [phone]

## 2019-07-03 NOTE — Anesthesia Procedure Notes (Signed)
Procedure Name: MAC Date/Time: 07/03/2019 8:30 AM Performed by: Lavell Luster, CRNA Pre-anesthesia Checklist: Patient identified, Emergency Drugs available, Suction available, Patient being monitored and Timeout performed Patient Re-evaluated:Patient Re-evaluated prior to induction Oxygen Delivery Method: Simple face mask Preoxygenation: Pre-oxygenation with 100% oxygen Induction Type: IV induction Placement Confirmation: positive ETCO2 and breath sounds checked- equal and bilateral

## 2019-07-03 NOTE — Anesthesia Postprocedure Evaluation (Signed)
Anesthesia Post Note  Patient: Joseph Hoffman  Procedure(s) Performed: AMPUTATION LEFT GREAT TOE (Left Toe)     Patient location during evaluation: PACU Anesthesia Type: Regional Level of consciousness: awake and alert Pain management: pain level controlled Vital Signs Assessment: post-procedure vital signs reviewed and stable Respiratory status: spontaneous breathing, nonlabored ventilation, respiratory function stable and patient connected to nasal cannula oxygen Cardiovascular status: stable and blood pressure returned to baseline Postop Assessment: no apparent nausea or vomiting Anesthetic complications: no    Last Vitals:  Vitals:   07/03/19 1300 07/03/19 1315  BP: 140/83 129/71  Pulse: (!) 57 (!) 47  Resp:    Temp:    SpO2:      Last Pain:  Vitals:   07/03/19 1050  TempSrc: Oral  PainSc:                  Catalina Gravel

## 2019-07-03 NOTE — Progress Notes (Signed)
[facility]  Subjective  - Right groin ok after arteriogram.     Objective (!) 143/78 (!) 56 (!) 97.4 F (36.3 C) 18 92%  Intake/Output Summary (Last 24 hours) at 07/03/2019 0731 Last data filed at 07/03/2019 0500 Gross per 24 hour  Intake 2210 ml  Output 4335 ml  Net -2125 ml    Right groin c/d/i - no hematoma Gangrene tissue loss left great toe  Laboratory Lab Results: Recent Labs    07/02/19 0511 07/03/19 0536  WBC 9.9 10.4  HGB 11.6* 11.8*  HCT 37.5* 37.0*  PLT 245 265   BMET Recent Labs    07/02/19 1628 07/02/19 2145 07/03/19 0536  NA 132*  --  134*  K 5.0  --  4.0  CL 99  --  103  CO2 24  --  22  GLUCOSE 573* 582* 313*  BUN 43*  --  42*  CREATININE 1.70*  --  1.40*  CALCIUM 8.7*  --  9.0    COAG Lab Results  Component Value Date   INR 1.2 07/03/2019   INR 1.4 (H) 07/02/2019   INR 1.4 (H) 07/01/2019   No results found for: PTT  Assessment/Planning: POD #1 s/p arteriogram.  Left great toe amputation.  In-line flow via peroneal runoff.  Discussed risk that toe does not heal after amputation.  Marty Heck 07/03/2019 7:31 AM --

## 2019-07-03 NOTE — Progress Notes (Signed)
Patient is sleeping and has not ate no urine out at this time.

## 2019-07-03 NOTE — Progress Notes (Signed)
Patient is drowsy from surgery, stable vitals placed on Cpap 3l sating 99-100% Condom Cath placed monitoring for urinary Retention.

## 2019-07-03 NOTE — Progress Notes (Signed)
[facility]ious Disease  Date of Admission:  06/26/2019     Total days of antibiotics 8         ASSESSMENT/PLAN  Joseph Hoffman is a 68 year old male with poorly controlled diabetes and severe cardiac dysfunction status post aortic valve replacement and awaiting redo aortic valve replacement admitted with worsening cardiac function and diabetic foot ulcer with gangrenous changes and surrounding cellulitis of the left lower extremity now status post left great toe amputation.  Severe aortic regurgitation -CVTS planning on redo aortic valve replacement.  Awaiting completion of treatment for lower extremity cellulitis/infection.  Gangrenous left toe with surrounding cellulitis status post amputation of the left great toe -surgery completed today with good margins and blood flow per operative notes.  Recommend continued antimicrobial therapy with Keflex 500 mg tid for 5 days, then discontinued antibiotics with no further therapy needed. Wound care per Vascular Surgery.  CKD Stage III - Renal function stable with most recent creatinine of 1.40.   ID will sign off and be available as needed during remainder of hospitalization.    Active Problems:   OSA on CPAP   Chronic anticoagulation   AS (aortic stenosis)   Essential hypertension   Acute combined systolic and diastolic heart failure (HCC)   CKD (chronic kidney disease), stage III (HCC)   Acute on chronic systolic (congestive) heart failure (HCC)   Depression   Foot ulcer, left (HCC)   Aortic valve regurgitation   SOB (shortness of breath)   Aortic valve disease    cholecalciferol  1,000 Units Oral Daily   digoxin  0.125 mg Oral QHS   escitalopram  10 mg Oral QPM   ezetimibe  10 mg Oral Daily   fenofibrate  160 mg Oral Daily   hydrALAZINE  12.5 mg Oral BID   Icosapent Ethyl  2 g Oral BID   insulin aspart       insulin aspart       insulin aspart  0-20 Units Subcutaneous TID WC   insulin aspart  0-5  Units Subcutaneous QHS   insulin aspart  20 Units Subcutaneous TID WC   insulin glargine  35 Units Subcutaneous BID   isosorbide mononitrate  30 mg Oral Daily   metoprolol succinate  25 mg Oral BID   mometasone-formoterol  2 puff Inhalation BID   multivitamin  1 tablet Oral Daily   nutrition supplement (JUVEN)  1 packet Oral BID BM   Ensure Max Protein  11 oz Oral Daily   saccharomyces boulardii  250 mg Oral Q M,W,F   senna-docusate  2 tablet Oral BID   sodium chloride flush  3 mL Intravenous Q12H   sodium chloride flush  3 mL Intravenous Q12H   vitamin C  1,000 mg Oral QPM    SUBJECTIVE:  Sleepy from surgery on CPAP during visit. Arousable.  Allergies  Allergen Reactions   Iohexol Anaphylaxis   Niacin And Related     Flushing with immediate realese   Penicillins Other (See Comments)    Unknown.[name]  Did it involve swelling of the face/tongue/throat, SOB, or low BP? Unknown Did it involve sudden or severe rash/hives, skin peeling, or any reaction on the inside of your mouth or nose? Unknown Did you need to seek medical attention at a hospital or doctor's office? Unknown When did it last happen?Childhood If all above answers are NO, may proceed with cephalosporin use.     Review of Systems: Review of Systems  Constitutional: Negative for chills, fever and weight loss.  Respiratory: Negative for cough, shortness of breath and wheezing.   Cardiovascular: Negative for chest pain and leg swelling.  Gastrointestinal: Negative for abdominal pain, constipation, diarrhea, nausea and vomiting.  Skin: Negative for rash.      OBJECTIVE: Vitals:   07/03/19 1230 07/03/19 1245 07/03/19 1300 07/03/19 1315  BP: 127/63 (!) 143/54 140/83 129/71  Pulse: (!) 53 (!) 50 (!) 57 (!) 47  Resp:      Temp:      TempSrc:      SpO2:      Weight:      Height:       Body mass index is 35.73 kg/m.  Physical Exam Constitutional:      General:  He is sleeping. He is not in acute distress.    Appearance: He is well-developed. He is obese.     Comments: On CPAP; pleasant/drowsy.   Cardiovascular:     Rate and Rhythm: Normal rate and regular rhythm.     Heart sounds: Normal heart sounds.  Pulmonary:     Effort: Pulmonary effort is normal.     Breath sounds: Normal breath sounds.  Musculoskeletal:     Comments: Surgical dressing clean and dry.   Skin:    General: Skin is warm and dry.  Neurological:     Mental Status: He is oriented to person, place, and time.     Lab Results Lab Results  Component Value Date   WBC 10.4 07/03/2019   HGB 11.8 (L) 07/03/2019   HCT 37.0 (L) 07/03/2019   MCV 83.1 07/03/2019   PLT 265 07/03/2019    Lab Results  Component Value Date   CREATININE 1.40 (H) 07/03/2019   BUN 42 (H) 07/03/2019   NA 134 (L) 07/03/2019   K 4.0 07/03/2019   CL 103 07/03/2019   CO2 22 07/03/2019    Lab Results  Component Value Date   ALT 28 05/28/2019   AST 37 05/28/2019   ALKPHOS 27 (L) 05/28/2019   BILITOT 1.0 05/28/2019     Microbiology: Recent Results (from the past 240 hour(s))  SARS Coronavirus 2 (CEPHEID - Performed in [facility] hospital lab), Hosp Order     Status: None   Collection Time: 06/26/19  1:15 PM   Specimen: Nasopharyngeal Swab  Result Value Ref Range Status   SARS Coronavirus 2 NEGATIVE NEGATIVE Final    Comment: (NOTE) If result is NEGATIVE SARS-CoV-2 target nucleic acids are NOT DETECTED. The SARS-CoV-2 RNA is generally detectable in upper and lower  respiratory specimens during the acute phase of infection. The lowest  concentration of SARS-CoV-2 viral copies this assay can detect is 250  copies / mL. A negative result does not preclude SARS-CoV-2 infection  and should not be used as the sole basis for treatment or other  patient management decisions.  A negative result may occur with  improper specimen collection / handling, submission of specimen other  than nasopharyngeal  swab, presence of viral mutation(s) within the  areas targeted by this assay, and inadequate number of viral copies  (<250 copies / mL). A negative result must be combined with clinical  observations, patient history, and epidemiological information. If result is POSITIVE SARS-CoV-2 target nucleic acids are DETECTED. The SARS-CoV-2 RNA is generally detectable in upper and lower  respiratory specimens dur ing the acute phase of infection.  Positive  results are indicative of active infection with SARS-CoV-2.  Clinical  correlation with patient  history and other diagnostic information is  necessary to determine patient infection status.  Positive results do  not rule out bacterial infection or co-infection with other viruses. If result is PRESUMPTIVE POSTIVE SARS-CoV-2 nucleic acids MAY BE PRESENT.   A presumptive positive result was obtained on the submitted specimen  and confirmed on repeat testing.  While 2019 novel coronavirus  (SARS-CoV-2) nucleic acids may be present in the submitted sample  additional confirmatory testing may be necessary for epidemiological  and / or clinical management purposes  to differentiate between  SARS-CoV-2 and other Sarbecovirus currently known to infect humans.  If clinically indicated additional testing with an alternate test  methodology [phone]) is advised. The SARS-CoV-2 RNA is generally  detectable in upper and lower respiratory sp ecimens during the acute  phase of infection. The expected result is Negative. Fact Sheet for Patients:  StrictlyIdeas.no Fact Sheet for Healthcare Providers: BankingDealers.co.za This test is not yet approved or cleared by the [place] FDA and has been authorized for detection and/or diagnosis of SARS-CoV-2 by FDA under an Emergency Use Authorization (EUA).  This EUA will remain in effect (meaning this test can be used) for the duration of the COVID-19 declaration  under Section 564(b)(1) of the Act, 21 U.S.C. section 360bbb-3(b)(1), unless the authorization is terminated or revoked sooner. Performed at [facility], [place] [address] [place], [place] [address]   Culture, blood (routine x 2)     Status: None   Collection Time: 06/26/19  2:46 PM   Specimen: BLOOD  Result Value Ref Range Status   Specimen Description BLOOD RIGHT ANTECUBITAL  Final   Special Requests   Final    BOTTLES DRAWN AEROBIC AND ANAEROBIC Blood Culture results may not be optimal due to an excessive volume of blood received in culture bottles   Culture   Final    NO GROWTH 5 DAYS Performed at [facility], [place] [address], [place], [place] [address]    Report Status 07/01/2019 FINAL  Final  Culture, blood (routine x 2)     Status: None   Collection Time: 06/26/19  2:52 PM   Specimen: BLOOD LEFT FOREARM  Result Value Ref Range Status   Specimen Description BLOOD LEFT FOREARM  Final   Special Requests   Final    BOTTLES DRAWN AEROBIC AND ANAEROBIC Blood Culture adequate volume   Culture   Final    NO GROWTH 5 DAYS Performed at [facility], [place] [address] [place], [place] [address]    Report Status 07/01/2019 FINAL  Final  Surgical pcr screen     Status: None   Collection Time: 07/01/19 11:23 PM   Specimen: Nasal Mucosa; Nasal Swab  Result Value Ref Range Status   MRSA, PCR NEGATIVE NEGATIVE Final   Staphylococcus aureus NEGATIVE NEGATIVE Final    Comment: (NOTE) The Xpert SA Assay (FDA approved for NASAL specimens in patients 77 years of age and older), is one component of a comprehensive surveillance program. It is not intended to diagnose infection nor to guide or monitor treatment. Performed at [facility], [place] [address], [place], [place] [address]      Terri Piedra, [place] [facility] [place] Pager  07/03/2019  3:00 PM

## 2019-07-03 NOTE — Anesthesia Procedure Notes (Signed)
Anesthesia Regional Block: Adductor canal block   Pre-Anesthetic Checklist: ,, timeout performed, Correct Patient, Correct Site, Correct Laterality, Correct Procedure, Correct Position, site marked, Risks and benefits discussed,  Surgical consent,  Pre-op evaluation,  At surgeon's request and post-op pain management  Laterality: Left  Prep: chloraprep       Needles:  Injection technique: Single-shot  Needle Type: Echogenic Needle     Needle Length: 9cm  Needle Gauge: 21     Additional Needles:   Procedures:,,,, ultrasound used (permanent image in chart),,,,  Narrative:  Start time: 07/03/2019 7:15 AM End time: 07/03/2019 7:18 AM Injection made incrementally with aspirations every 5 mL.  Performed by: Personally  Anesthesiologist: Catalina Gravel, MD  Additional Notes: No pain on injection. No increased resistance to injection. Injection made in 5cc increments.  Good needle visualization.  Patient tolerated procedure well.

## 2019-07-03 NOTE — Plan of Care (Signed)

## 2019-07-03 NOTE — Anesthesia Procedure Notes (Signed)
Anesthesia Regional Block: Popliteal block   Pre-Anesthetic Checklist: ,, timeout performed, Correct Patient, Correct Site, Correct Laterality, Correct Procedure, Correct Position, site marked, Risks and benefits discussed,  Surgical consent,  Pre-op evaluation,  At surgeon's request and post-op pain management  Laterality: Left  Prep: chloraprep       Needles:  Injection technique: Single-shot  Needle Type: Echogenic Needle     Needle Length: 9cm  Needle Gauge: 21     Additional Needles:   Procedures:,,,, ultrasound used (permanent image in chart),,,,  Narrative:  Start time: 07/03/2019 7:07 AM End time: 07/03/2019 7:15 AM Injection made incrementally with aspirations every 5 mL.  Performed by: Personally  Anesthesiologist: Catalina Gravel, MD  Additional Notes: No pain on injection. No increased resistance to injection. Injection made in 5cc increments.  Good needle visualization.  Patient tolerated procedure well.

## 2019-07-03 NOTE — Progress Notes (Signed)
PT Cancellation Note  Patient Details Name: Joseph Hoffman MRN: [id number] DOB: Apr 29, 1951   Cancelled Treatment:    Reason Eval/Treat Not Completed: Patient at procedure or test/unavailable   Michaelina Blandino B Marceline Napierala 07/03/2019, 7:19 AM  Elwyn Reach, PT Acute Rehabilitation Services Pager: [phone] Office: [phone]

## 2019-07-03 NOTE — Progress Notes (Signed)
Inpatient Diabetes Program Recommendations  AACE/ADA: New Consensus Statement on Inpatient Glycemic Control (2015)  Target Ranges:  Prepandial:   less than 140 mg/dL      Peak postprandial:   less than 180 mg/dL (1-2 hours)      Critically ill patients:  140 - 180 mg/dL   Lab Results  Component Value Date   GLUCAP 226 (H) 07/03/2019   HGBA1C 8.7 (H) 05/13/2019    Review of Glycemic Control Results for BUD, KAESER (MRN [id number]) as of 07/03/2019 12:47  Ref. Range 07/02/2019 21:14 07/02/2019 21:15 07/03/2019 06:07 07/03/2019 11:29  Glucose-Capillary Latest Ref Range: 70 - 99 mg/dL >600 (HH) >600 (HH) 300 (H) 226 (H)   Diabetes history: Type 2 DM Outpatient Diabetes medications: Victoza 1.8 mg QHS, Jardiance 25 mg QD, Amaryl 2 mg QAM, Lantus 15-40 units BID, Metformin 2000 mg QHS Current orders for Inpatient glycemic control: Lantus 35 units BID, Novolog 20 units TID, Novolog 0-20 units TID, Novolog 0-5 units QHS Novolog 10 units x 1 Solumedrol 125 mg x 1 Inpatient Diabetes Program Recommendations:    Noted hyperglycemia last night as high as 585 mg/dL. This was following Solumedrol administration and 2 nutritional supplements in addition to meals. In agreement with discontinuation of Breeze Boost ( as each contain 54 g of carbohydrates) and subsequent insulin dosages.   Discussed with Dr Nevada Crane to update Lantus dose following surgery for now to ensure patient received BID dosing. Orders updated.   Thanks, Bronson Curb, MSN, RNC-OB Diabetes Coordinator [phone] (8a-5p)

## 2019-07-03 NOTE — Progress Notes (Signed)
Pt states he will place himself on CPAP when ready.  °

## 2019-07-03 NOTE — Transfer of Care (Signed)
Immediate Anesthesia Transfer of Care Note  Patient: Joseph Hoffman  Procedure(s) Performed: AMPUTATION LEFT GREAT TOE (Left Toe)  Patient Location: PACU  Anesthesia Type:MAC  Level of Consciousness: awake, alert  and oriented  Airway & Oxygen Therapy: Patient connected to nasal cannula oxygen  Post-op Assessment: Post -op Vital signs reviewed and stable  Post vital signs: stable  Last Vitals:  Vitals Value Taken Time  BP    Temp    Pulse 50 07/03/19 0838  Resp 12 07/03/19 0838  SpO2 98 % 07/03/19 0838  Vitals shown include unvalidated device data.  Last Pain:  Vitals:   07/02/19 2032  TempSrc:   PainSc: 0-No pain         Complications: No apparent anesthesia complications

## 2019-07-04 ENCOUNTER — Encounter (HOSPITAL_COMMUNITY): Payer: Self-pay | Admitting: Vascular Surgery

## 2019-07-04 LAB — BASIC METABOLIC PANEL
Anion gap: 7 (ref 5–15)
BUN: 37 mg/dL — ABNORMAL HIGH (ref 8–23)
CO2: 26 mmol/L (ref 22–32)
Calcium: 9 mg/dL (ref 8.9–10.3)
Chloride: 104 mmol/L (ref 98–111)
Creatinine, Ser: 1.18 mg/dL (ref 0.61–1.24)
GFR calc Af Amer: >60 mL/min (ref >60)
GFR calc non Af Amer: >60 mL/min (ref >60)
Glucose, Bld: 201 mg/dL — ABNORMAL HIGH (ref 70–99)
Potassium: 4.1 mmol/L (ref 3.5–5.1)
Sodium: 137 mmol/L (ref 135–145)

## 2019-07-04 LAB — CBC
HCT: 39.1 % (ref 39.0–52.0)
Hemoglobin: 11.9 g/dL — ABNORMAL LOW (ref 13.0–17.0)
MCH: 25.6 pg — ABNORMAL LOW (ref 26.0–34.0)
MCHC: 30.4 g/dL (ref 30.0–36.0)
MCV: 84.3 fL (ref 80.0–100.0)
Platelets: 261 10*3/uL (ref 150–400)
RBC: 4.64 MIL/uL (ref 4.22–5.81)
RDW: 18 % — ABNORMAL HIGH (ref 11.5–15.5)
WBC: 11 10*3/uL — ABNORMAL HIGH (ref 4.0–10.5)
nRBC: 0 % (ref 0.0–0.2)

## 2019-07-04 LAB — GLUCOSE, CAPILLARY
Glucose-Capillary: 189 mg/dL — ABNORMAL HIGH (ref 70–99)
Glucose-Capillary: 260 mg/dL — ABNORMAL HIGH (ref 70–99)
Glucose-Capillary: 206 mg/dL — ABNORMAL HIGH (ref 70–99)
Glucose-Capillary: 182 mg/dL — ABNORMAL HIGH (ref 70–99)

## 2019-07-04 LAB — HEPARIN LEVEL (UNFRACTIONATED): Heparin Unfractionated: 0.17 IU/mL — ABNORMAL LOW (ref 0.30–0.70)

## 2019-07-04 MED ORDER — DM-GUAIFENESIN ER 30-600 MG PO TB12
2.0000 | ORAL_TABLET | Freq: Two times a day (BID) | ORAL | Status: DC
  Administered 2019-07-04 – 2019-07-06 (×5): 2 via ORAL
  Filled 2019-07-04 (×3): qty 2
  Filled 2019-07-04 (×2): qty 1
  Filled 2019-07-04: qty 2

## 2019-07-04 MED ORDER — HEPARIN BOLUS VIA INFUSION
3000.0000 [IU] | Freq: Once | INTRAVENOUS | Status: AC
  Administered 2019-07-04: 3000 [IU] via INTRAVENOUS
  Filled 2019-07-04: qty 3000

## 2019-07-04 MED ORDER — HEPARIN (PORCINE) 25000 UT/250ML-% IV SOLN
2250.0000 [IU]/h | INTRAVENOUS | Status: DC
  Administered 2019-07-04: 1200 [IU]/h via INTRAVENOUS
  Administered 2019-07-05 – 2019-07-09 (×6): 1700 [IU]/h via INTRAVENOUS
  Administered 2019-07-10: 2250 [IU]/h via INTRAVENOUS
  Filled 2019-07-04 (×10): qty 250

## 2019-07-04 MED ORDER — IPRATROPIUM-ALBUTEROL 0.5-2.5 (3) MG/3ML IN SOLN
3.0000 mL | Freq: Four times a day (QID) | RESPIRATORY_TRACT | Status: DC
  Filled 2019-07-04: qty 3

## 2019-07-04 MED ORDER — SODIUM CHLORIDE 0.9% FLUSH: 10.0000 mL | INTRAVENOUS | Status: DC | PRN

## 2019-07-04 MED ORDER — CEPHALEXIN 250 MG PO CAPS
500.0000 mg | ORAL_CAPSULE | Freq: Three times a day (TID) | ORAL | Status: AC
  Administered 2019-07-04 – 2019-07-08 (×20): 500 mg via ORAL
  Filled 2019-07-04 (×20): qty 2

## 2019-07-04 MED ORDER — HEPARIN BOLUS VIA INFUSION
1500.0000 [IU] | Freq: Once | INTRAVENOUS | Status: AC
  Administered 2019-07-04: 1500 [IU] via INTRAVENOUS
  Filled 2019-07-04: qty 1500

## 2019-07-04 MED ORDER — FUROSEMIDE 10 MG/ML IJ SOLN
80.0000 mg | Freq: Once | INTRAMUSCULAR | Status: AC
  Administered 2019-07-04: 10:00:00 80 mg via INTRAVENOUS
  Filled 2019-07-04: qty 8

## 2019-07-04 MED ORDER — WARFARIN - PHARMACIST DOSING INPATIENT: Freq: Every day | Status: DC

## 2019-07-04 MED ORDER — WARFARIN SODIUM 7.5 MG PO TABS: 15.0000 mg | ORAL_TABLET | Freq: Once | ORAL | Status: DC

## 2019-07-04 NOTE — Progress Notes (Signed)
Patient has heparin drip ordered. Current IV site leaking upon flushing. IV Team consult entered to place anew line, given patient poor veins. Joseph Hoffman, [facility]

## 2019-07-04 NOTE — Progress Notes (Signed)
[facility] for Heparin Indication: atrial fibrillation/mechanical AVR  Allergies  Allergen Reactions  . Iohexol Anaphylaxis  . Niacin And Related     Flushing with immediate realese  . Penicillins Other (See Comments)    Unknown.Joseph Hoffman a child  Did it involve swelling of the face/tongue/throat, SOB, or low BP? Unknown Did it involve sudden or severe rash/hives, skin peeling, or any reaction on the inside of your mouth or nose? Unknown Did you need to seek medical attention at a hospital or doctor's office? Unknown When did it last happen?Childhood If all above answers are "NO", may proceed with cephalosporin use.    Patient Measurements: Height: 5\' 11"  (180.3 cm) Weight: 258 lb 13.1 oz (117.4 kg) IBW/kg (Calculated) : 75.3  Heparin dosing weight: 100 kg  Vital Signs: Temp: 97.5 F (36.4 C) (08/01 1123) Temp Source: Oral (08/01 1123) BP: 140/75 (08/01 1123) Pulse Rate: 65 (08/01 1123)  Labs: Recent Labs    07/02/19 0511 07/02/19 1628 07/03/19 0536 07/04/19 0518 07/04/19 1810  HGB 11.6*  --  11.8* 11.9*  --   HCT 37.5*  --  37.0* 39.1  --   PLT 245  --  265 261  --   LABPROT 16.7*  --  15.3*  --   --   INR 1.4*  --  1.2  --   --   HEPARINUNFRC 0.37  --   --   --  0.17*  CREATININE 1.36* 1.70* 1.40* 1.18  --     Estimated Creatinine Clearance: 75.9 mL/min (by C-G formula based on SCr of 1.18 mg/dL).   Assessment: Joseph Hoffman with history of Afib and mechanical AVR on Coumadin PTA. Patient is s/p cath 06/29/19 and will need redo AVR +/- CABG after foot infection resolved. Toe was amputated on 7/31 and pt will complete 5 more days of PO antibiotics (Keflex).  Coumadin PTA for Afib + mech AVR (2013) - Home dose: 10 mg daily except 12.5 mg on Tu/Th/Sa  HL 0.17 - subtherapeutic (goal 0.3-0.7) No s/sx of bleeding. H/H stable plt stable.   Goal of Therapy:  Heparin level 0.3-0.7 units/ml  Monitor platelets by anticoagulation  protocol: Yes    Plan:  -Heparin bolus 1500 units -Increase heparin to 1500 units/hr  -Check 8hr HL -Monitor for s/sx of bleeding   [name], PharmD PGY1 Ambulatory Care Resident [facility] # [phone]

## 2019-07-04 NOTE — Progress Notes (Signed)
Patient ID: Joseph Hoffman, male   DOB: 1951-01-22, 68 y.o.   MRN: [id number] Comfortable.  No pain in his amputation site.  Dressing intact. Will remove dressing in a.m. at 48 hours. Will resume IV heparin for his mechanical heart valve. Patient reports plan for redo aortic valve surgery next week.  Will not resume Coumadin

## 2019-07-04 NOTE — Progress Notes (Addendum)
Progress Note  Patient Name: Joseph Hoffman Date of Encounter: 07/04/2019  Primary Cardiologist: Shelva Majestic, MD   Subjective   No complaints  Inpatient Medications    Scheduled Meds:  cephALEXin  500 mg Oral TID AC & HS   cholecalciferol  1,000 Units Oral Daily   digoxin  0.125 mg Oral QHS   escitalopram  10 mg Oral QPM   ezetimibe  10 mg Oral Daily   fenofibrate  160 mg Oral Daily   heparin  3,000 Units Intravenous Once   hydrALAZINE  12.5 mg Oral BID   Icosapent Ethyl  2 g Oral BID   insulin aspart  0-20 Units Subcutaneous TID WC   insulin aspart  0-5 Units Subcutaneous QHS   insulin aspart  20 Units Subcutaneous TID WC   insulin glargine  35 Units Subcutaneous BID   isosorbide mononitrate  30 mg Oral Daily   metoprolol succinate  25 mg Oral BID   mometasone-formoterol  2 puff Inhalation BID   multivitamin  1 tablet Oral Daily   nutrition supplement (JUVEN)  1 packet Oral BID BM   Ensure Max Protein  11 oz Oral Daily   saccharomyces boulardii  250 mg Oral Q M,W,F   senna-docusate  2 tablet Oral BID   sodium chloride flush  3 mL Intravenous Q12H   sodium chloride flush  3 mL Intravenous Q12H   vitamin C  1,000 mg Oral QPM   Continuous Infusions:  sodium chloride 50 mL/hr at 06/30/19 0635   sodium chloride 250 mL (07/01/19 0634)   sodium chloride     heparin     PRN Meds: sodium chloride, sodium chloride, acetaminophen, albuterol, guaiFENesin-dextromethorphan, hydrALAZINE, HYDROmorphone (DILAUDID) injection, labetalol, loperamide, nitroGLYCERIN, ondansetron (ZOFRAN) IV, oxyCODONE, sodium chloride flush, sodium chloride flush   Vital Signs    Vitals:   07/03/19 1959 07/03/19 2040 07/04/19 0657 07/04/19 0731  BP:  137/67 (!) 142/72   Pulse:  92    Resp:  18 20   Temp:  98.2 F (36.8 C) 98 F (36.7 C)   TempSrc:  Oral Oral   SpO2: 95% 96% 96% 94%  Weight:   117.4 kg   Height:        Intake/Output Summary (Last 24 hours) at  07/04/2019 0850 Last data filed at 07/04/2019 0657 Gross per 24 hour  Intake 240 ml  Output 450 ml  Net -210 ml   Last 3 Weights 07/04/2019 07/03/2019 07/02/2019  Weight (lbs) 258 lb 13.1 oz 256 lb 3.2 oz 258 lb 1.6 oz  Weight (kg) 117.4 kg 116.212 kg 117.073 kg      Telemetry    Afib, occasional brady- Personally Reviewed  ECG    n/a - Personally Reviewed  Physical Exam   GEN: No acute distress.   Neck: No JVD Cardiac: irreg, mechanical S2  Respiratory: Clear to auscultation bilaterally. GI: Soft, nontender, non-distended  MS: No edema; No deformity. Neuro:  Nonfocal  Psych: Normal affect   Labs    High Sensitivity Troponin:   Recent Labs  Lab 06/26/19 1315 06/26/19 1446  TROPONINIHS 96* 99*      Cardiac EnzymesNo results for input(s): TROPONINI in the last 168 hours. No results for input(s): TROPIPOC in the last 168 hours.   Chemistry Recent Labs  Lab 07/02/19 1628 07/02/19 2145 07/03/19 0536 07/04/19 0518  NA 132*  --  134* 137  K 5.0  --  4.0 4.1  CL 99  --  103 104  CO2 24  --  22 26  GLUCOSE 573* 582* 313* 201*  BUN 43*  --  42* 37*  CREATININE 1.70*  --  1.40* 1.18  CALCIUM 8.7*  --  9.0 9.0  GFRNONAA 40*  --  51* >60  GFRAA 46*  --  59* >60  ANIONGAP 9  --  9 7     Hematology Recent Labs  Lab 07/02/19 0511 07/03/19 0536 07/04/19 0518  WBC 9.9 10.4 11.0*  RBC 4.49 4.45 4.64  HGB 11.6* 11.8* 11.9*  HCT 37.5* 37.0* 39.1  MCV 83.5 83.1 84.3  MCH 25.8* 26.5 25.6*  MCHC 30.9 31.9 30.4  RDW 17.6* 17.3* 18.0*  PLT 245 265 261    BNPNo results for input(s): BNP, PROBNP in the last 168 hours.   DDimer No results for input(s): DDIMER in the last 168 hours.   Radiology    No results found.  Cardiac Studies    Patient Profile     BRITAIN SABER a 68 y.o.malewith a PMH of permanent atrial fibrillation, chronic combined CHF,St. JudeAVR in 2013, HTN, HLD, OSA on CPAP, DM type 2, CKD stage 3who has recently was found to have a  decline in LV fxn with significant AVR perivalvular leak and recent nonhealing foot ulcer with development of gangrenous plantar aspect of left great toe.  Assessment & Plan    1. Acute on Chronic Biventricular Heart Failure  - ECHO 05/2019 EF 30-35% Severe AI, severe RV dysfunction - Volume up in setting of holding diuretics for contrast exposure - On Toprol and digoxin, hydral/nitrates. No ACE/ARB/ARNI with A/CKD for now  +8mL yesterday, neg 7L since admission. DId not receive any diuretic yesterday. Cr improving, down to 1.18 today - per CHF team goal weight 249-251 lbs. Reported weight 257 lbs today.  - also recs to start entresto 24/26mg  bid once Cr less than 1.8  - dose IV lasix 80mg  x 1 today. Follow Cr with diuresis prior to starting entresto  2. Chest Pain, CAD - LHC 06/29/19: Multivessel coronary disease, 50% mid LAD, 80% large OM2, 50% mid RCA, and 80-90% ostial rPDA stenoses.  - intolerant of statins. On Vascepa & zetia - Possible CABG at time of re-do AVR - postponed due to toe infection  3. Severe Aortic insuffiency in setting of mechanical AVR - H/O Mechanical AVR 2014 [employer] Jude  - pending re-do high-risk AVR +/- CABG when other medical issues stable.   4. L foot Diabetic Foot Ulcer - per ID and primary team  5. Chronic A fib  - Rate controlled on Toprol XL and digoxin. Doses cut back due to bradycardia - on hep gtt currently for flexibility for invasive procedures    6. CKD Stage IV  - Creatinine baseline ~2. - Cr improved today down to 1.18   9. Bradycardia, nighttime - dig level 0.3 - metoprolol cut down to 25 bid  For questions or updates, please contact [place] Please consult [email or web address] for contact info under        Signed, Carlyle Dolly, MD  07/04/2019, 8:50 AM

## 2019-07-04 NOTE — Progress Notes (Signed)
[place] for Heparin Indication: atrial fibrillation/mechanical AVR  Allergies  Allergen Reactions  . Iohexol Anaphylaxis  . Niacin And Related     Flushing with immediate realese  . Penicillins Other (See Comments)    Unknown.Joseph Kitchenaortic stenosis a child  Did it involve swelling of the face/tongue/throat, SOB, or low BP? Unknown Did it involve sudden or severe rash/hives, skin peeling, or any reaction on the inside of your mouth or nose? Unknown Did you need to seek medical attention at a hospital or doctor's office? Unknown When did it last happen?Childhood If all above answers are "NO", may proceed with cephalosporin use.    Patient Measurements: Height: 5\' 11"  (180.3 cm) Weight: 258 lb 13.1 oz (117.4 kg) IBW/kg (Calculated) : 75.3  Heparin dosing weight: 100 kg  Vital Signs: Temp: 98 F (36.7 C) (08/01 0657) Temp Source: Oral (08/01 0657) BP: 142/72 (08/01 0657) Pulse Rate: 92 (07/31 2040)  Labs: Recent Labs    07/02/19 0511 07/02/19 1628 07/03/19 0536 07/04/19 0518  HGB 11.6*  --  11.8* 11.9*  HCT 37.5*  --  37.0* 39.1  PLT 245  --  265 261  LABPROT 16.7*  --  15.3*  --   INR 1.4*  --  1.2  --   HEPARINUNFRC 0.37  --   --   --   CREATININE 1.36* 1.70* 1.40* 1.18    Estimated Creatinine Clearance: 75.9 mL/min (by C-G formula based on SCr of 1.18 mg/dL).   Assessment: 65 YOM with history of Afib and mechanical AVR on Coumadin PTA. Patient is s/p cath 06/29/19 and will need redo AVR +/- CABG after foot infection resolved. Toe was amputated on 7/31 and pt will complete 5 more days of PO antibiotics (Keflex). Will need to be bridged back to warfarin after AVR redo +/- CABG.   Coumadin PTA for Afib + mech AVR (2013) - Home dose: 10 mg daily except 12.5 mg on Tu/Th/Sa  No s/sx of bleeding. H/H stable plt stable at 261.   Goal of Therapy:  Heparin level 0.3-0.7 units/ml  Monitor platelets by anticoagulation protocol: Yes    Plan:   -Heparin bolus of 3000 units -Haparin drip at 1200 units/hr -Check heparin level in 8 hours -Check daily CBC & heparin level -Monitor for s/sx of bleeding -F/u plans to transition back to Coumadin after redo AVR +/- CABG   Kennon Holter, PharmD PGY1 Ambulatory Care Pharmacy Resident [employer] Phone: [phone] 07/04/2019 8:27 AM

## 2019-07-04 NOTE — Progress Notes (Signed)
PROGRESS NOTE  Joseph Hoffman FVC:[id number] DOB: [birth year] DOA: 06/26/2019 PCP: Orpah Melter, MD  HPI/Recap of past 24 hours: Joseph Hoffman  is a 68 y.o. male, with medical history significant ofhistory significant ofhypertension, diabetes mellitus, asthma,sCHFwith EF 45%, CAD, atrial fibrillation on Coumadin,AS, s/p ofAVR with mechanical valveon coumadin, OSA on CPAP, CKD stage III, depression with anxiety, who presents with shortness of breath, and chest pain, as well he reports worsening left great toe ulcer with surrounding cellulitis -Patient reports he started to complain of chest pressure over last 48 hours, accompanied by dyspnea, mainly upon exertion, he did take sublingual nitro, with improvement of his chest pain, but today reported did not have any significant event on his chest pain, patient with known mechanical aortic valve insufficiency, plan to have urgent cath by Dr. Claiborne Billings next week, for anticipated aortic valve replacement by CT surgery next month. -Patient was noted to have left lower extremity diabetic great toe ulcer, report is been going on for last 2 weeks, with some drainage, as well with surrounding cellulitis, he denies any fever, chills, he did not seek any medical care for it. - in ED patient was hypertensive, mildly tachypneic, retinae elevated at 2.1 from baseline 1.8, mild leukocytosis at 14 point 9K, high-sensitivity troponins were elevated at 96>99, INR supratherapeutic at 3.9, COVID-19 is negative, EKG showing A. fib, patient was seen by cardiology for chest pain, and hospitalist were requested to admit for multiple medical problems, with a presentation of infected diabetic ulcer, gangrenous left toe with cellulitis.  07/02/19: No new complaints this am. Reported 25 beats PVC and asymptomatic. LE aortogram planned today for critical limb ischemia of the left lower extremity with left great toe wound on 07/02/19. Surgeon:  Marty Heck, MD Procedure  Performed: 1.  Ultrasound-guided access of the right common femoral artery 2.  Aortogram 3.  Bilateral lower extremity arteriogram with runoff 4.  Mynx closure of the right common femoral artery  Left great toe amputation 07/03/19 by vascular surgery Dr. Carlis Abbott.  07/04/19: Patient was seen and examined at his bedside this morning.  Reports intermittent cough.  Afebrile.  POD 1 post left great toe amputation.  His pain is well controlled on current pain management.   Assessment/Plan: Active Problems:   OSA on CPAP   Chronic anticoagulation   AS (aortic stenosis)   Essential hypertension   Acute combined systolic and diastolic heart failure (HCC)   CKD (chronic kidney disease), stage III (HCC)   Acute on chronic systolic (congestive) heart failure (HCC)   Depression   Foot ulcer, left (HCC)   Aortic valve regurgitation   SOB (shortness of breath)   Aortic valve disease  Resolving sepsis secondary to L great toe gangrenous diabetic ulcer with surrounding cellulitis POD 1 post left great toe amputation on 07/03/2019 by vascular surgery Dr. Carlis Abbott. Presented with leukocytosis with WBC 14 K, tachypnea with respiratory rate 31 Elevated CRP 9.5, sed rate 25 on 06/29/2019 Completed broad-spectrum IV antibiotics IV vancomycin and IV Rocephin Switched to Keflex x5 days on 07/03/2019 by infectious disease, signed off. Afebrile Blood culture drawn on 06/26/2019- to date Vital signs stable  POD #1 post ray amputation of left great toe from left great toe diabetic ulcer/gangrenous with surrounding cellulitis in the setting of PVD VVS with LE angio on 07/02/2019. Unable to revascularize.  Ray amputation of left great toe on 07/03/2019 by vascular surgery Dr. Carlis Abbott Continue to optimize pain control Continue to optimize blood sugar control P.o.  Keflex x5 days as recommended by infectious disease Bone/tissue no organisms seen  Cough, post surgery Start incentive spirometer Start neb treatment DuoNeb  every 6h and Mucinex.  Resolving AKI on CKD2 Baseline creatinine appears to be 1.1 with GFR greater than 60  Presented with creatinine of 2.19 with GFR of 29 Back to his baseline creatinine 1.1 with GFR greater than 60 on 07/04/2019 Continue to avoid nephrotoxins Continue to monitor urine output and volume status Net I&O -7.2 L since admission  Daily BMPs while on diuretics  Uncontrolled diabetes, type II with hyperglycemia Hemoglobin A1c 8.7 on 05/13/2019 Insulin doses have been increased as stated below Continue resistant insulin sliding scale Continue Lantus 35 units twice daily Continue NovoLog 20 units before meals Diabetes coordinator following  Acute on chronic combined diastolic and systolic CHF Presented with BNP greater than 600 Cardiology following Continue cardiac medications as recommended by cardiology Post right and left heart cath 06/29/19 Continue strict I's and O's and daily weight Received intermittent doses of IV Lasix As recommended by heart failure team, try to get weight back to 249-251 pounds prior to d/c. If BP tolerates and creatinine less than 1.8 would aim to get back on Entresto 24/26 bid (can stop hydral/ntg) prior to d/c. Would recommend torsemide 40 am/20 pm at time of discharge.  Heart failure team will arrange f/u in HF Clinic.   Permanent A. fib with intermittent slow ventricular response Heart rate intermittently in the 50s Toprol xl and digoxin doses reduced by cardiology C/ to closely monitor on telemetry  Severe aortic insufficiency/RV dysfunction Status post aortic valve replacement and awaiting redo aortic valve replacement which has been postponed Possible CABG at time of redo AVR, postponed due to to infection.  Resolved leukocytosis WBC noted to be trending up this morning 12K from 11K>>10K >>11K Afebrile Blood cx x2 negative to date  Resolved acute hypervolemic hyponatremia, asymptomatic Sodium 137  Subtherapeutic INR Restarted  heparin drip 07/04/19 Per vascular surgery for mechanical valve Coumadin on hold due to anticipated cardiac procedures  Right upper extremity bruising with IV puncture Continue to apply ice as needed Minimal tenderness with palpation  Acute on chronic hypoxic respiratory failure likely secondary to acute on chronic combined CHF Improving Continue to wean off O2 supplementation Self reports on home O2 supplementation only at night Maintain O2 saturation greater than 92% Continue nebs  Severe aortic regurgitation post mechanical AVR On Coumadin, held due to possible procedure On heparin drip, managed by pharmacy  Chest pain, resolved   OSA, compliant Continue CPAP at night  Hyperlipidemia Intolerant to statin On fenofibrate, icosapent, and zetia     Code Status: Full code  Family Communication: We will call family if okay with the patient.  Disposition Plan: Discharge to home possibly in 1-2 days or when cardiology and vascular surgery sign off.  Infectious disease signed off on 07/03/2019  Consultants:  Cardiology  Vascular surgery  Infectious disease  Procedures:  Heart cath on 06/29/2019  Antimicrobials:  IV vancomycin, Rocephin  IV Flagyl DC'd   DVT prophylaxis: Heparin drip.  Objective: Vitals:   07/03/19 2040 07/04/19 0657 07/04/19 0731 07/04/19 1123  BP: 137/67 (!) 142/72  140/75  Pulse: 92   65  Resp: 18 20  20   Temp: 98.2 F (36.8 C) 98 F (36.7 C)  (!) 97.5 F (36.4 C)  TempSrc: Oral Oral  Oral  SpO2: 96% 96% 94% 98%  Weight:  117.4 kg    Height:  Intake/Output Summary (Last 24 hours) at 07/04/2019 1350 Last data filed at 07/04/2019 1300 Gross per 24 hour  Intake 1200 ml  Output 1651 ml  Net -451 ml   Filed Weights   07/02/19 0443 07/03/19 0458 07/04/19 0657  Weight: 117.1 kg 116.2 kg 117.4 kg    Exam:  . General: 68 y.o. year-old male obese in no acute distress.  Alert and oriented x3.   . Cardiovascular: Irregular rate and  rhythm with no rubs or gallops.  No JVD or thyromegaly noted.   [name] Respiratory: Clear to auscultation no wheezes or rales.  Poor inspiratory effort.   . Abdomen: Obese nontender nondistended hypoactive bowel sounds present. . Musculoskeletal: Trace lower extremity edema bilaterally.  Left foot in surgical dressing.   [name] Psychiatry: Mood is appropriate for condition and setting.   Data Reviewed: CBC: Recent Labs  Lab 06/28/19 0602  06/30/19 0551 07/01/19 0515 07/02/19 0511 07/03/19 0536 07/04/19 0518  WBC 11.1*   < > 10.0 10.9* 9.9 10.4 11.0*  NEUTROABS 7.8*  --   --   --   --   --   --   HGB 12.5*   < > 11.3* 11.9* 11.6* 11.8* 11.9*  HCT 40.6   < > 35.7* 37.3* 37.5* 37.0* 39.1  MCV 83.7   < > 81.0 81.6 83.5 83.1 84.3  PLT 237   < > 229 250 245 265 261   < > = values in this interval not displayed.   Basic Metabolic Panel: Recent Labs  Lab 07/01/19 0515 07/02/19 0511 07/02/19 1628 07/02/19 2145 07/03/19 0536 07/04/19 0518  NA 138 136 132*  --  134* 137  K 3.5 4.2 5.0  --  4.0 4.1  CL 104 105 99  --  103 104  CO2 24 22 24   --  22 26  GLUCOSE 75 156* 573* 582* 313* 201*  BUN 48* 44* 43*  --  42* 37*  CREATININE 1.56* 1.36* 1.70*  --  1.40* 1.18  CALCIUM 8.8* 8.7* 8.7*  --  9.0 9.0  MG  --  2.5*  --   --   --   --    GFR: Estimated Creatinine Clearance: 75.9 mL/min (by C-G formula based on SCr of 1.18 mg/dL). Liver Function Tests: No results for input(s): AST, ALT, ALKPHOS, BILITOT, PROT, ALBUMIN in the last 168 hours. No results for input(s): LIPASE, AMYLASE in the last 168 hours. No results for input(s): AMMONIA in the last 168 hours. Coagulation Profile: Recent Labs  Lab 06/29/19 0311 06/30/19 0551 07/01/19 0515 07/02/19 0511 07/03/19 0536  INR 1.7* 1.4* 1.4* 1.4* 1.2   Cardiac Enzymes: No results for input(s): CKTOTAL, CKMB, CKMBINDEX, TROPONINI in the last 168 hours. BNP (last 3 results) No results for input(s): PROBNP in the last 8760 hours. HbA1C: No  results for input(s): HGBA1C in the last 72 hours. CBG: Recent Labs  Lab 07/03/19 1129 07/03/19 1628 07/03/19 2116 07/04/19 0635 07/04/19 1118  GLUCAP 226* 127* 189* 189* 260*   Lipid Profile: No results for input(s): CHOL, HDL, LDLCALC, TRIG, CHOLHDL, LDLDIRECT in the last 72 hours. Thyroid Function Tests: No results for input(s): TSH, T4TOTAL, FREET4, T3FREE, THYROIDAB in the last 72 hours. Anemia Panel: No results for input(s): VITAMINB12, FOLATE, FERRITIN, TIBC, IRON, RETICCTPCT in the last 72 hours. Urine analysis:    Component Value Date/Time   COLORURINE YELLOW 09/25/2012 1506   APPEARANCEUR CLEAR 09/25/2012 1506   LABSPEC 1.045 (H) 09/25/2012 1506   PHURINE 6.0  09/25/2012 1506   GLUCOSEU >1000 (A) 09/25/2012 1506   HGBUR NEGATIVE 09/25/2012 1506   BILIRUBINUR NEGATIVE 09/25/2012 1506   KETONESUR NEGATIVE 09/25/2012 1506   PROTEINUR NEGATIVE 09/25/2012 1506   UROBILINOGEN 0.2 09/25/2012 1506   NITRITE NEGATIVE 09/25/2012 1506   LEUKOCYTESUR NEGATIVE 09/25/2012 1506   Sepsis Labs: @LABRCNTIP (procalcitonin:4,lacticidven:4)  ) Recent Results (from the past 240 hour(s))  SARS Coronavirus 2 (CEPHEID - Performed in [place] hospital lab), Hosp Order     Status: None   Collection Time: 06/26/19  1:15 PM   Specimen: Nasopharyngeal Swab  Result Value Ref Range Status   SARS Coronavirus 2 NEGATIVE NEGATIVE Final    Comment: (NOTE) If result is NEGATIVE SARS-CoV-2 target nucleic acids are NOT DETECTED. The SARS-CoV-2 RNA is generally detectable in upper and lower  respiratory specimens during the acute phase of infection. The lowest  concentration of SARS-CoV-2 viral copies this assay can detect is 250  copies / mL. A negative result does not preclude SARS-CoV-2 infection  and should not be used as the sole basis for treatment or other  patient management decisions.  A negative result may occur with  improper specimen collection / handling, submission of specimen  other  than nasopharyngeal swab, presence of viral mutation(s) within the  areas targeted by this assay, and inadequate number of viral copies  (<250 copies / mL). A negative result must be combined with clinical  observations, patient history, and epidemiological information. If result is POSITIVE SARS-CoV-2 target nucleic acids are DETECTED. The SARS-CoV-2 RNA is generally detectable in upper and lower  respiratory specimens dur ing the acute phase of infection.  Positive  results are indicative of active infection with SARS-CoV-2.  Clinical  correlation with patient history and other diagnostic information is  necessary to determine patient infection status.  Positive results do  not rule out bacterial infection or co-infection with other viruses. If result is PRESUMPTIVE POSTIVE SARS-CoV-2 nucleic acids MAY BE PRESENT.   A presumptive positive result was obtained on the submitted specimen  and confirmed on repeat testing.  While 2019 novel coronavirus  (SARS-CoV-2) nucleic acids may be present in the submitted sample  additional confirmatory testing may be necessary for epidemiological  and / or clinical management purposes  to differentiate between  SARS-CoV-2 and other Sarbecovirus currently known to infect humans.  If clinically indicated additional testing with an alternate test  methodology [phone]) is advised. The SARS-CoV-2 RNA is generally  detectable in upper and lower respiratory sp ecimens during the acute  phase of infection. The expected result is Negative. Fact Sheet for Patients:  StrictlyIdeas.no Fact Sheet for Healthcare Providers: BankingDealers.co.za This test is not yet approved or cleared by the [place] FDA and has been authorized for detection and/or diagnosis of SARS-CoV-2 by FDA under an Emergency Use Authorization (EUA).  This EUA will remain in effect (meaning this test can be used) for the duration  of the COVID-19 declaration under Section 564(b)(1) of the Act, 21 U.S.C. section 360bbb-3(b)(1), unless the authorization is terminated or revoked sooner. Performed at [facility], [place] [address], [place], [place] [address]   Culture, blood (routine x 2)     Status: None   Collection Time: 06/26/19  2:46 PM   Specimen: BLOOD  Result Value Ref Range Status   Specimen Description BLOOD RIGHT ANTECUBITAL  Final   Special Requests   Final    BOTTLES DRAWN AEROBIC AND ANAEROBIC Blood Culture results may not be optimal due  to an excessive volume of blood received in culture bottles   Culture   Final    NO GROWTH 5 DAYS Performed at [facility], [place] [address] [place], [place] [address]    Report Status 07/01/2019 FINAL  Final  Culture, blood (routine x 2)     Status: None   Collection Time: 06/26/19  2:52 PM   Specimen: BLOOD LEFT FOREARM  Result Value Ref Range Status   Specimen Description BLOOD LEFT FOREARM  Final   Special Requests   Final    BOTTLES DRAWN AEROBIC AND ANAEROBIC Blood Culture adequate volume   Culture   Final    NO GROWTH 5 DAYS Performed at [facility], [place] [address], [place], [place] [address]    Report Status 07/01/2019 FINAL  Final  Surgical pcr screen     Status: None   Collection Time: 07/01/19 11:23 PM   Specimen: Nasal Mucosa; Nasal Swab  Result Value Ref Range Status   MRSA, PCR NEGATIVE NEGATIVE Final   Staphylococcus aureus NEGATIVE NEGATIVE Final    Comment: (NOTE) The Xpert SA Assay (FDA approved for NASAL specimens in patients 59 years of age and older), is one component of a comprehensive surveillance program. It is not intended to diagnose infection nor to guide or monitor treatment. Performed at [facility], [place] [address], [place], [place] [address]   Aerobic/Anaerobic Culture (surgical/deep wound)     Status: None (Preliminary result)   Collection Time: 07/03/19  8:02 AM   Specimen: Bone; Tissue  Result  Value Ref Range Status   Specimen Description BONE LEFT METATARSAL  Final   Special Requests PATIENT ON FOLLOWING ANCEF  Final   Gram Stain   Final    RARE WBC PRESENT, PREDOMINANTLY PMN NO ORGANISMS SEEN    Culture   Final    NO GROWTH 1 DAY Performed at [facility], [place] [address] [place], [place] [address]    Report Status PENDING  Incomplete      Studies: No results found.  Scheduled Meds: . cephALEXin  500 mg Oral TID AC & HS  . cholecalciferol  1,000 Units Oral Daily  . digoxin  0.125 mg Oral QHS  . escitalopram  10 mg Oral QPM  . ezetimibe  10 mg Oral Daily  . fenofibrate  160 mg Oral Daily  . hydrALAZINE  12.5 mg Oral BID  . Icosapent Ethyl  2 g Oral BID  . insulin aspart  0-20 Units Subcutaneous TID WC  . insulin aspart  0-5 Units Subcutaneous QHS  . insulin aspart  20 Units Subcutaneous TID WC  . insulin glargine  35 Units Subcutaneous BID  . isosorbide mononitrate  30 mg Oral Daily  . metoprolol succinate  25 mg Oral BID  . mometasone-formoterol  2 puff Inhalation BID  . multivitamin  1 tablet Oral Daily  . nutrition supplement (JUVEN)  1 packet Oral BID BM  . Ensure Max Protein  11 oz Oral Daily  . saccharomyces boulardii  250 mg Oral Q M,W,F  . senna-docusate  2 tablet Oral BID  . sodium chloride flush  3 mL Intravenous Q12H  . sodium chloride flush  3 mL Intravenous Q12H  . vitamin C  1,000 mg Oral QPM    Continuous Infusions: . sodium chloride 50 mL/hr at 06/30/19 0635  . sodium chloride 250 mL (07/01/19 0634)  . sodium chloride    . heparin 1,200 Units/hr (07/04/19 1202)  LOS: 8 days     Kayleen Memos, MD [facility] Pager [phone]  If 7PM-7AM, please contact night-coverage [email or web address] Password [email or web address] 07/04/2019, 1:50 PM

## 2019-07-05 DIAGNOSIS — I5023 Acute on chronic systolic (congestive) heart failure: Secondary | ICD-10-CM

## 2019-07-05 LAB — BASIC METABOLIC PANEL
Anion gap: 11 (ref 5–15)
BUN: 36 mg/dL — ABNORMAL HIGH (ref 8–23)
CO2: 24 mmol/L (ref 22–32)
Calcium: 8.9 mg/dL (ref 8.9–10.3)
Chloride: 101 mmol/L (ref 98–111)
Creatinine, Ser: 1.18 mg/dL (ref 0.61–1.24)
GFR calc Af Amer: >60 mL/min (ref >60)
GFR calc non Af Amer: >60 mL/min (ref >60)
Glucose, Bld: 109 mg/dL — ABNORMAL HIGH (ref 70–99)
Potassium: 3.7 mmol/L (ref 3.5–5.1)
Sodium: 136 mmol/L (ref 135–145)

## 2019-07-05 LAB — CBC
HCT: 37.9 % — ABNORMAL LOW (ref 39.0–52.0)
Hemoglobin: 11.6 g/dL — ABNORMAL LOW (ref 13.0–17.0)
MCH: 26 pg (ref 26.0–34.0)
MCHC: 30.6 g/dL (ref 30.0–36.0)
MCV: 84.8 fL (ref 80.0–100.0)
Platelets: 250 10*3/uL (ref 150–400)
RBC: 4.47 MIL/uL (ref 4.22–5.81)
RDW: 17.7 % — ABNORMAL HIGH (ref 11.5–15.5)
WBC: 11.5 10*3/uL — ABNORMAL HIGH (ref 4.0–10.5)
nRBC: 0 % (ref 0.0–0.2)

## 2019-07-05 LAB — GLUCOSE, CAPILLARY
Glucose-Capillary: 96 mg/dL (ref 70–99)
Glucose-Capillary: 202 mg/dL — ABNORMAL HIGH (ref 70–99)
Glucose-Capillary: 114 mg/dL — ABNORMAL HIGH (ref 70–99)
Glucose-Capillary: 194 mg/dL — ABNORMAL HIGH (ref 70–99)

## 2019-07-05 LAB — HEPARIN LEVEL (UNFRACTIONATED)
Heparin Unfractionated: 0.18 IU/mL — ABNORMAL LOW (ref 0.30–0.70)
Heparin Unfractionated: 0.45 IU/mL (ref 0.30–0.70)

## 2019-07-05 MED ORDER — HEPARIN BOLUS VIA INFUSION
1500.0000 [IU] | Freq: Once | INTRAVENOUS | Status: AC
  Administered 2019-07-05: 1500 [IU] via INTRAVENOUS
  Filled 2019-07-05: qty 1500

## 2019-07-05 MED ORDER — FUROSEMIDE 10 MG/ML IJ SOLN
80.0000 mg | Freq: Once | INTRAMUSCULAR | Status: AC
  Administered 2019-07-05: 80 mg via INTRAVENOUS
  Filled 2019-07-05: qty 8

## 2019-07-05 NOTE — Plan of Care (Signed)

## 2019-07-05 NOTE — Progress Notes (Signed)
[place] for Heparin Indication: atrial fibrillation/mechanical AVR  Allergies  Allergen Reactions  . Iohexol Anaphylaxis  . Niacin And Related     Flushing with immediate realese  . Penicillins Other (See Comments)    Unknown.[name]  Did it involve swelling of the face/tongue/throat, SOB, or low BP? Unknown Did it involve sudden or severe rash/hives, skin peeling, or any reaction on the inside of your mouth or nose? Unknown Did you need to seek medical attention at a hospital or doctor's office? Unknown When did it last happen?Childhood If all above answers are "NO", may proceed with cephalosporin use.    Patient Measurements: Height: 5\' 11"  (180.3 cm) Weight: 254 lb 13.6 oz (115.6 kg) IBW/kg (Calculated) : 75.3  Heparin dosing weight: 100 kg  Vital Signs: Temp: 97.8 F (36.6 C) (08/02 1151) Temp Source: Oral (08/02 1151) BP: 139/75 (08/02 1151) Pulse Rate: 86 (08/02 1151)  Labs: Recent Labs    07/03/19 0536 07/04/19 0518 07/04/19 1810 07/05/19 0334 07/05/19 1503  HGB 11.8* 11.9*  --  11.6*  --   HCT 37.0* 39.1  --  37.9*  --   PLT 265 261  --  250  --   LABPROT 15.3*  --   --   --   --   INR 1.2  --   --   --   --   HEPARINUNFRC  --   --  0.17* 0.18* 0.45  CREATININE 1.40* 1.18  --  1.18  --     Estimated Creatinine Clearance: 75.3 mL/min (by C-G formula based on SCr of 1.18 mg/dL).   Assessment: 53 YOM with history of Afib and mechanical AVR on Coumadin PTA. Patient is s/p cath 06/29/19 and will need redo AVR +/- CABG after foot infection resolved. Toe was amputated on 7/31 and pt will complete 5 more days of PO antibiotics (Keflex).  Coumadin PTA for Afib + mech AVR (2013) - Home dose: 10 mg daily except 12.5 mg on Tu/Th/Sa  HL 0.45 - at goal No s/sx of bleeding. H/H stable plt stable.   Goal of Therapy:  Heparin level 0.3-0.7 units/ml  Monitor platelets by anticoagulation protocol: Yes    Plan:   -Continue heparin 1700 units/hr   -Check 8hr cHL -Monitor for s/sx of bleeding  [name], PharmD PGY1 Ambulatory Care Resident [employer] # [phone]

## 2019-07-05 NOTE — Progress Notes (Signed)
Progress Note  Patient Name: Joseph Hoffman Date of Encounter: 07/05/2019  Primary Cardiologist: Shelva Majestic, MD  Subjective   No complaints  Inpatient Medications    Scheduled Meds:  cephALEXin  500 mg Oral TID AC & HS   cholecalciferol  1,000 Units Oral Daily   dextromethorphan-guaiFENesin  2 tablet Oral BID   digoxin  0.125 mg Oral QHS   escitalopram  10 mg Oral QPM   ezetimibe  10 mg Oral Daily   fenofibrate  160 mg Oral Daily   hydrALAZINE  12.5 mg Oral BID   Icosapent Ethyl  2 g Oral BID   insulin aspart  0-20 Units Subcutaneous TID WC   insulin aspart  0-5 Units Subcutaneous QHS   insulin aspart  20 Units Subcutaneous TID WC   insulin glargine  35 Units Subcutaneous BID   isosorbide mononitrate  30 mg Oral Daily   metoprolol succinate  25 mg Oral BID   mometasone-formoterol  2 puff Inhalation BID   multivitamin  1 tablet Oral Daily   nutrition supplement (JUVEN)  1 packet Oral BID BM   Ensure Max Protein  11 oz Oral Daily   saccharomyces boulardii  250 mg Oral Q M,W,F   senna-docusate  2 tablet Oral BID   sodium chloride flush  3 mL Intravenous Q12H   sodium chloride flush  3 mL Intravenous Q12H   vitamin C  1,000 mg Oral QPM   Continuous Infusions:  sodium chloride 50 mL/hr at 06/30/19 0635   sodium chloride 250 mL (07/01/19 0634)   sodium chloride     heparin 1,700 Units/hr (07/05/19 0535)   PRN Meds: sodium chloride, sodium chloride, acetaminophen, albuterol, guaiFENesin-dextromethorphan, hydrALAZINE, HYDROmorphone (DILAUDID) injection, labetalol, loperamide, nitroGLYCERIN, ondansetron (ZOFRAN) IV, oxyCODONE, sodium chloride flush, sodium chloride flush, sodium chloride flush   Vital Signs    Vitals:   07/04/19 2019 07/05/19 0500 07/05/19 0618 07/05/19 0750  BP: 135/62  (!) 144/66   Pulse: 71  92   Resp: 20  20   Temp: 98 F (36.7 C)  98 F (36.7 C)   TempSrc: Oral     SpO2: 97%  100% 99%  Weight:  115.6 kg      Height:        Intake/Output Summary (Last 24 hours) at 07/05/2019 1027 Last data filed at 07/05/2019 0943 Gross per 24 hour  Intake 1964.54 ml  Output 2701 ml  Net -736.46 ml   Last 3 Weights 07/05/2019 07/04/2019 07/03/2019  Weight (lbs) 254 lb 13.6 oz 258 lb 13.1 oz 256 lb 3.2 oz  Weight (kg) 115.6 kg 117.4 kg 116.212 kg      Telemetry    afib rate controlled - Personally Reviewed  ECG    n/a - Personally Reviewed  Physical Exam   GEN: No acute distress.   Neck: No JVD Cardiac: irreg, mechanical S2 Respiratory: Clear to auscultation bilaterally. GI: Soft, nontender, non-distended  MS: 1+ bilateral LE edema; No deformity. Neuro:  Nonfocal  Psych: Normal affect   Labs    High Sensitivity Troponin:   Recent Labs  Lab 06/26/19 1315 06/26/19 1446  TROPONINIHS 96* 99*      Cardiac EnzymesNo results for input(s): TROPONINI in the last 168 hours. No results for input(s): TROPIPOC in the last 168 hours.   Chemistry Recent Labs  Lab 07/03/19 0536 07/04/19 0518 07/05/19 0334  NA 134* 137 136  K 4.0 4.1 3.7  CL 103 104 101  CO2 22 26  24  GLUCOSE 313* 201* 109*  BUN 42* 37* 36*  CREATININE 1.40* 1.18 1.18  CALCIUM 9.0 9.0 8.9  GFRNONAA 51* >60 >60  GFRAA 59* >60 >60  ANIONGAP 9 7 11      Hematology Recent Labs  Lab 07/03/19 0536 07/04/19 0518 07/05/19 0334  WBC 10.4 11.0* 11.5*  RBC 4.45 4.64 4.47  HGB 11.8* 11.9* 11.6*  HCT 37.0* 39.1 37.9*  MCV 83.1 84.3 84.8  MCH 26.5 25.6* 26.0  MCHC 31.9 30.4 30.6  RDW 17.3* 18.0* 17.7*  PLT 265 261 250    BNPNo results for input(s): BNP, PROBNP in the last 168 hours.   DDimer No results for input(s): DDIMER in the last 168 hours.   Radiology    No results found.  Cardiac Studies     Patient Profile     Joseph Hoffman a 68 y.o.malewith a PMH of permanent atrial fibrillation, chronic combined CHF,St. JudeAVR in 2013, HTN, HLD, OSA on CPAP, DM type 2, CKD stage 3who has recently was found to  have a decline in LV fxn with significant AVR perivalvular leakand recent nonhealing foot ulcer with development of gangrenous plantar aspect of left great toe.  Assessment & Plan    1. Acute on Chronic Biventricular Heart Failure  - ECHO 05/2019 EF 30-35% Severe AI, severe RV dysfunction -Volume up in setting of holding diuretics for contrast exposure - On Toprol and digoxin, hydral/nitrates. No ACE/ARB/ARNI with A/CKD for now   - negative 949mL yesterday, neg 8L since admission. Received IV lasix 80mg  x 1 yesterday. Renal function is stable.  - per CHF team goal weight 249-251 lbs. Weight today 254 lbs. Remains flud overloaded by exam, redose IV lasix 80mg  x 1 today.   - also recs to start entresto 24/26mg  bid once Cr less than 1.8. Hold for now as we continue to diurese   2. Chest Pain, CAD - LHC 06/29/19: Multivessel coronary disease, 50% mid LAD, 80% large OM2, 50% mid RCA, and 80-90% ostial rPDA stenoses.  - intolerant of statins. On Vascepa & zetia - Possible CABG at time of re-do AVR- postponed due to toe infection  3. Severe Aortic insuffiency in setting of mechanical AVR - H/O Mechanical AVR 2014 ST Jude  - pending re-do high-risk AVR +/- CABG when other medical issues stable.   4. L foot Diabetic Foot Ulcer - per ID and primary team  5. Chronic A fib  - Rate controlled on Toprol XL and digoxin. Doses cut back due to bradycardia - on hep gtt currently for flexibility for invasive procedures    6. CKD Stage IV  - Creatinine baseline ~2 previously, down to 1.18 this admit    7. Bradycardia, nighttime - meds reduced - no significant sustaned brady overnight.   For questions or updates, please contact [place] Please consult [email or web address] for contact info under        Signed, Carlyle Dolly, MD  07/05/2019, 10:27 AM

## 2019-07-05 NOTE — Progress Notes (Signed)
Patient ID: Joseph Hoffman, male   DOB: 1951-04-24, 68 y.o.   MRN: [id number] Dressing removed from great toe amputation.  Looks quite good.  Skin edges viable.

## 2019-07-05 NOTE — Progress Notes (Signed)
PROGRESS NOTE  [name] XBJ:[id number] DOB: 03-02-1951 DOA: 06/26/2019 PCP: Orpah Melter, MD  HPI/Recap of past 24 hours: Joseph Hoffman  is a 68 y.o. male, with medical history significant ofhistory significant ofhypertension, diabetes mellitus, asthma,sCHFwith EF 45%, CAD, atrial fibrillation on Coumadin,AS, s/p ofAVR with mechanical valveon coumadin, OSA on CPAP, CKD stage III, depression with anxiety, who presents with shortness of breath, and chest pain, as well he reports worsening left great toe ulcer with surrounding cellulitis  Patient reports he started to complain of chest pressure over last 48 hours, accompanied by dyspnea, mainly upon exertion, he did take sublingual nitro, with improvement of his chest pain, but today reported did not have any significant event on his chest pain, patient with known mechanical aortic valve insufficiency, plan to have urgent cath by Dr. Claiborne Billings next week, for anticipated aortic valve replacement by CT surgery next month.  Patient was noted to have left lower extremity diabetic great toe ulcer, report is been going on for last 2 weeks, with some drainage, as well with surrounding cellulitis, he denies any fever, chills, he did not seek any medical care for it.  In the ED patient was hypertensive, mildly tachypneic, retinae elevated at 2.1 from baseline 1.8, mild leukocytosis at 14 point 9K, high-sensitivity troponins were elevated at 96>99, INR supratherapeutic at 3.9, COVID-19 is negative, EKG showing A. fib, patient was seen by cardiology for chest pain, and hospitalist were requested to admit for multiple medical problems, with a presentation of infected diabetic ulcer, gangrenous left toe with cellulitis.    07/05/19: Patient was seen and examined at his bedside this morning.  Resting comfortably no complaints.  Pain control.  Denies headache, no fever/chills/night sweats, no chest pain, no palpitations, no shortness of breath, no  abdominal pain, no weakness.  No acute events overnight per nursing staff.  Assessment/Plan: Active Problems:   OSA on CPAP   Chronic anticoagulation   AS (aortic stenosis)   Essential hypertension   Acute combined systolic and diastolic heart failure (HCC)   CKD (chronic kidney disease), stage III (HCC)   Acute on chronic systolic (congestive) heart failure (HCC)   Depression   Foot ulcer, left (HCC)   Aortic valve regurgitation   SOB (shortness of breath)   Aortic valve disease  Sepsis secondary to L great toe gangrenous diabetic ulcer with surrounding cellulitis, POA Presented with leukocytosis with WBC 14 K, tachypnea with respiratory rate 31, Elevated CRP 9.5, sed rate 25.  Patient was started on broad-spectrum IV antibiotics with vancomycin and Rocephin.  Vascular surgery was consulted and patient underwent ray amputation of left great toe by vascular surgery, Dr. Carlis Abbott on 07/03/2019.  Blood cultures x2 showed no growth x5 days.  Infectious disease was consulted and antibiotics were de-escalated to Keflex. --Continue Keflex 500 mg p.o. 3 times daily, with projected end date 07/07/2019.  AKI on CKD2 Presented with creatinine of 2.19 with GFR of 29. Back to his baseline creatinine 1.1 with GFR greater than 60 on 07/04/2019.  Etiology likely secondary to volume overload versus ATN from underlying sepsis as above. --net negative 960mL past 24h and negative 7.9L since admission --Cr down to 1.18 --Continue to avoid nephrotoxins --Continue to monitor urine output and volume status --Continue to monitor renal function daily  Uncontrolled diabetes, type II with hyperglycemia Hemoglobin A1c 8.7 on 05/13/2019.  Diabetic educator following --Continue Lantus 35 units twice daily --Continue NovoLog 20 units before meals --Continue close monitoring of CBGs.  Acute on  chronic combined diastolic and systolic CHF Presented with BNP greater than 600.  Chest x-ray with cardiomegaly and prominent  bronchovascular markings; no consolidation. --Cardiology following, appreciate assistance --s/p right/left heart catheterization 06/29/2019 --Continues on IV diuresis with furosemide per cardiology --net negative 919mL past 24h and negative 7.9L since admission --Attempting to get weight back to 249-251 pounds per cardiology prior to discharge.  Also plan to resume Entresto twice daily on discharge with recommendations of torsemide 40 mg qAm and 20 mg qPM.   Permanent A. fib with intermittent slow ventricular response Toprol xl and digoxin doses reduced by cardiology; 25mg  p.o. BID and 0.125mg  PO daily respectively --Continue to monitor on telemetry  Severe aortic insufficiency/RV dysfunction Status post aortic valve replacement and awaiting redo aortic valve replacement which has been postponed --Possible CABG at time of redo AVR, postponed due to to infection. --Will need to follow-up with CTS [name] to see if will perform surgery during this hospitalization is once antibiotics are complete; with scheduled end date 07/07/2019.  Subtherapeutic INR Restarted heparin drip 07/04/19 Per vascular surgery for mechanical valve --Coumadin on hold due to anticipated cardiac procedures  Acute on chronic hypoxic respiratory failure likely secondary to acute on chronic combined CHF Etiology likely secondary to volume overload.  Now weaned off supplemental oxygen. --Continue to monitor vital signs closely with SPO2  Severe aortic regurgitation post mechanical AVR On Coumadin, held due to possible procedure --Continue heparin drip  Chest pain, resolved   OSA, compliant Continue CPAP at night  Hyperlipidemia Intolerant to statin On fenofibrate, icosapent, and zetia     Code Status: Full code  Family Communication: None  Disposition Plan: Discharge to home possibly in 1-2 days or when cardiology and vascular surgery sign off.  Infectious disease signed off on 07/03/2019  Consultants:   Cardiology  Vascular surgery  Infectious disease  Procedures:  Heart cath on 06/29/2019  Left great toe amputation 07/03/19 by vascular surgery Dr. Carlis Abbott  Antimicrobials:  IV vancomycin, Rocephin  IV Flagyl  Keflex 8/1>>  DVT prophylaxis: Heparin drip.  Objective: Vitals:   07/04/19 2019 07/05/19 0500 07/05/19 0618 07/05/19 0750  BP: 135/62  (!) 144/66   Pulse: 71  92   Resp: 20  20   Temp: 98 F (36.7 C)  98 F (36.7 C)   TempSrc: Oral     SpO2: 97%  100% 99%  Weight:  115.6 kg    Height:        Intake/Output Summary (Last 24 hours) at 07/05/2019 1115 Last data filed at 07/05/2019 0943 Gross per 24 hour  Intake 1964.54 ml  Output 2701 ml  Net -736.46 ml   Filed Weights   07/03/19 0458 07/04/19 0657 07/05/19 0500  Weight: 116.2 kg 117.4 kg 115.6 kg    Exam:  . General: 68 y.o. year-old male obese in no acute distress.  Alert and oriented x3.   . Cardiovascular: Irregular rate and rhythm with no rubs or gallops.  Mechanical click appreciated, no JVD or thyromegaly noted.  1+ pitting edema bilateral lower extremities . Respiratory: Clear to auscultation no wheezes or rales.  Poor inspiratory effort.   . Abdomen: Obese nontender nondistended hypoactive bowel sounds present. . Musculoskeletal: Trace lower extremity edema bilaterally.  Left foot in surgical dressing.   [name] Psychiatry: Mood is appropriate for condition and setting.   Data Reviewed: CBC: Recent Labs  Lab 07/01/19 0515 07/02/19 0511 07/03/19 0536 07/04/19 0518 07/05/19 0334  WBC 10.9* 9.9 10.4 11.0* 11.5*  HGB 11.9* 11.6* 11.8* 11.9* 11.6*  HCT 37.3* 37.5* 37.0* 39.1 37.9*  MCV 81.6 83.5 83.1 84.3 84.8  PLT 250 245 265 261 517   Basic Metabolic Panel: Recent Labs  Lab 07/02/19 0511 07/02/19 1628 07/02/19 2145 07/03/19 0536 07/04/19 0518 07/05/19 0334  NA 136 132*  --  134* 137 136  K 4.2 5.0  --  4.0 4.1 3.7  CL 105 99  --  103 104 101  CO2 22 24  --  22 26 24   GLUCOSE 156* 573*  582* 313* 201* 109*  BUN 44* 43*  --  42* 37* 36*  CREATININE 1.36* 1.70*  --  1.40* 1.18 1.18  CALCIUM 8.7* 8.7*  --  9.0 9.0 8.9  MG 2.5*  --   --   --   --   --    GFR: Estimated Creatinine Clearance: 75.3 mL/min (by C-G formula based on SCr of 1.18 mg/dL). Liver Function Tests: No results for input(s): AST, ALT, ALKPHOS, BILITOT, PROT, ALBUMIN in the last 168 hours. No results for input(s): LIPASE, AMYLASE in the last 168 hours. No results for input(s): AMMONIA in the last 168 hours. Coagulation Profile: Recent Labs  Lab 06/29/19 0311 06/30/19 0551 07/01/19 0515 07/02/19 0511 07/03/19 0536  INR 1.7* 1.4* 1.4* 1.4* 1.2   Cardiac Enzymes: No results for input(s): CKTOTAL, CKMB, CKMBINDEX, TROPONINI in the last 168 hours. BNP (last 3 results) No results for input(s): PROBNP in the last 8760 hours. HbA1C: No results for input(s): HGBA1C in the last 72 hours. CBG: Recent Labs  Lab 07/04/19 0635 07/04/19 1118 07/04/19 1619 07/04/19 2119 07/05/19 0619  GLUCAP 189* 260* 206* 182* 96   Lipid Profile: No results for input(s): CHOL, HDL, LDLCALC, TRIG, CHOLHDL, LDLDIRECT in the last 72 hours. Thyroid Function Tests: No results for input(s): TSH, T4TOTAL, FREET4, T3FREE, THYROIDAB in the last 72 hours. Anemia Panel: No results for input(s): VITAMINB12, FOLATE, FERRITIN, TIBC, IRON, RETICCTPCT in the last 72 hours. Urine analysis:    Component Value Date/Time   COLORURINE YELLOW 09/25/2012 1506   APPEARANCEUR CLEAR 09/25/2012 1506   LABSPEC 1.045 (H) 09/25/2012 1506   PHURINE 6.0 09/25/2012 1506   GLUCOSEU >1000 (A) 09/25/2012 1506   HGBUR NEGATIVE 09/25/2012 1506   BILIRUBINUR NEGATIVE 09/25/2012 1506   KETONESUR NEGATIVE 09/25/2012 1506   PROTEINUR NEGATIVE 09/25/2012 1506   UROBILINOGEN 0.2 09/25/2012 1506   NITRITE NEGATIVE 09/25/2012 1506   LEUKOCYTESUR NEGATIVE 09/25/2012 1506   Sepsis Labs: @LABRCNTIP (procalcitonin:4,lacticidven:4)  ) Recent Results (from  the past 240 hour(s))  SARS Coronavirus 2 (CEPHEID - Performed in [place] hospital lab), Hosp Order     Status: None   Collection Time: 06/26/19  1:15 PM   Specimen: Nasopharyngeal Swab  Result Value Ref Range Status   SARS Coronavirus 2 NEGATIVE NEGATIVE Final    Comment: (NOTE) If result is NEGATIVE SARS-CoV-2 target nucleic acids are NOT DETECTED. The SARS-CoV-2 RNA is generally detectable in upper and lower  respiratory specimens during the acute phase of infection. The lowest  concentration of SARS-CoV-2 viral copies this assay can detect is 250  copies / mL. A negative result does not preclude SARS-CoV-2 infection  and should not be used as the sole basis for treatment or other  patient management decisions.  A negative result may occur with  improper specimen collection / handling, submission of specimen other  than nasopharyngeal swab, presence of viral mutation(s) within the  areas targeted by this assay, and inadequate number  of viral copies  (<250 copies / mL). A negative result must be combined with clinical  observations, patient history, and epidemiological information. If result is POSITIVE SARS-CoV-2 target nucleic acids are DETECTED. The SARS-CoV-2 RNA is generally detectable in upper and lower  respiratory specimens dur ing the acute phase of infection.  Positive  results are indicative of active infection with SARS-CoV-2.  Clinical  correlation with patient history and other diagnostic information is  necessary to determine patient infection status.  Positive results do  not rule out bacterial infection or co-infection with other viruses. If result is PRESUMPTIVE POSTIVE SARS-CoV-2 nucleic acids MAY BE PRESENT.   A presumptive positive result was obtained on the submitted specimen  and confirmed on repeat testing.  While 2019 novel coronavirus  (SARS-CoV-2) nucleic acids may be present in the submitted sample  additional confirmatory testing may be necessary  for epidemiological  and / or clinical management purposes  to differentiate between  SARS-CoV-2 and other Sarbecovirus currently known to infect humans.  If clinically indicated additional testing with an alternate test  methodology [phone]) is advised. The SARS-CoV-2 RNA is generally  detectable in upper and lower respiratory sp ecimens during the acute  phase of infection. The expected result is Negative. Fact Sheet for Patients:  StrictlyIdeas.no Fact Sheet for Healthcare Providers: BankingDealers.co.za This test is not yet approved or cleared by the [place] FDA and has been authorized for detection and/or diagnosis of SARS-CoV-2 by FDA under an Emergency Use Authorization (EUA).  This EUA will remain in effect (meaning this test can be used) for the duration of the COVID-19 declaration under Section 564(b)(1) of the Act, 21 U.S.C. section 360bbb-3(b)(1), unless the authorization is terminated or revoked sooner. Performed at [facility], [place] [address], [place], [place] [address]   Culture, blood (routine x 2)     Status: None   Collection Time: 06/26/19  2:46 PM   Specimen: BLOOD  Result Value Ref Range Status   Specimen Description BLOOD RIGHT ANTECUBITAL  Final   Special Requests   Final    BOTTLES DRAWN AEROBIC AND ANAEROBIC Blood Culture results may not be optimal due to an excessive volume of blood received in culture bottles   Culture   Final    NO GROWTH 5 DAYS Performed at [facility], [place] [address], [place], [place] [address]    Report Status 07/01/2019 FINAL  Final  Culture, blood (routine x 2)     Status: None   Collection Time: 06/26/19  2:52 PM   Specimen: BLOOD LEFT FOREARM  Result Value Ref Range Status   Specimen Description BLOOD LEFT FOREARM  Final   Special Requests   Final    BOTTLES DRAWN AEROBIC AND ANAEROBIC Blood Culture adequate volume   Culture   Final    NO GROWTH 5 DAYS  Performed at [facility], [place] [address], [place], [place] [address]    Report Status 07/01/2019 FINAL  Final  Surgical pcr screen     Status: None   Collection Time: 07/01/19 11:23 PM   Specimen: Nasal Mucosa; Nasal Swab  Result Value Ref Range Status   MRSA, PCR NEGATIVE NEGATIVE Final   Staphylococcus aureus NEGATIVE NEGATIVE Final    Comment: (NOTE) The Xpert SA Assay (FDA approved for NASAL specimens in patients 42 years of age and older), is one component of a comprehensive surveillance program. It is not intended to diagnose infection nor to guide or monitor treatment.  Performed at [facility], [place] [address], [place], [place] [address]   Aerobic/Anaerobic Culture (surgical/deep wound)     Status: None (Preliminary result)   Collection Time: 07/03/19  8:02 AM   Specimen: Bone; Tissue  Result Value Ref Range Status   Specimen Description BONE LEFT METATARSAL  Final   Special Requests PATIENT ON FOLLOWING ANCEF  Final   Gram Stain   Final    RARE WBC PRESENT, PREDOMINANTLY PMN NO ORGANISMS SEEN    Culture   Final    NO GROWTH 2 DAYS Performed at [facility], [place] [address], [place], [place] [address]    Report Status PENDING  Incomplete      Studies: No results found.  Scheduled Meds: . cephALEXin  500 mg Oral TID AC & HS  . cholecalciferol  1,000 Units Oral Daily  . dextromethorphan-guaiFENesin  2 tablet Oral BID  . digoxin  0.125 mg Oral QHS  . escitalopram  10 mg Oral QPM  . ezetimibe  10 mg Oral Daily  . fenofibrate  160 mg Oral Daily  . furosemide  80 mg Intravenous Once  . hydrALAZINE  12.5 mg Oral BID  . Icosapent Ethyl  2 g Oral BID  . insulin aspart  0-20 Units Subcutaneous TID WC  . insulin aspart  0-5 Units Subcutaneous QHS  . insulin aspart  20 Units Subcutaneous TID WC  . insulin glargine  35 Units Subcutaneous BID  . isosorbide mononitrate  30 mg Oral Daily  . metoprolol succinate  25 mg Oral BID  . mometasone-formoterol   2 puff Inhalation BID  . multivitamin  1 tablet Oral Daily  . nutrition supplement (JUVEN)  1 packet Oral BID BM  . Ensure Max Protein  11 oz Oral Daily  . saccharomyces boulardii  250 mg Oral Q M,W,F  . senna-docusate  2 tablet Oral BID  . sodium chloride flush  3 mL Intravenous Q12H  . sodium chloride flush  3 mL Intravenous Q12H  . vitamin C  1,000 mg Oral QPM    Continuous Infusions: . sodium chloride 50 mL/hr at 06/30/19 0635  . sodium chloride 250 mL (07/01/19 0634)  . sodium chloride    . heparin 1,700 Units/hr (07/05/19 0535)     LOS: 9 days     Cambell Rickenbach J [place] DO [facility] Pager [phone]  If 7PM-7AM, please contact night-coverage [email or web address] Password [email or web address] 07/05/2019, 11:15 AM

## 2019-07-05 NOTE — Progress Notes (Signed)
Arrived to patients room for pt MDI and pt was already on CPAP.

## 2019-07-05 NOTE — Progress Notes (Signed)
[place] for Heparin Indication: atrial fibrillation/mechanical AVR  Allergies  Allergen Reactions  . Iohexol Anaphylaxis  . Niacin And Related     Flushing with immediate realese  . Penicillins Other (See Comments)    Unknown.[name]  Did it involve swelling of the face/tongue/throat, SOB, or low BP? Unknown Did it involve sudden or severe rash/hives, skin peeling, or any reaction on the inside of your mouth or nose? Unknown Did you need to seek medical attention at a hospital or doctor's office? Unknown When did it last happen?Childhood If all above answers are "NO", may proceed with cephalosporin use.    Patient Measurements: Height: 5\' 11"  (180.3 cm) Weight: 258 lb 13.1 oz (117.4 kg) IBW/kg (Calculated) : 75.3  Heparin dosing weight: 100 kg  Vital Signs: Temp: 98 F (36.7 C) (08/01 2019) Temp Source: Oral (08/01 2019) BP: 135/62 (08/01 2019) Pulse Rate: 71 (08/01 2019)  Labs: Recent Labs    07/03/19 0536 07/04/19 0518 07/04/19 1810 07/05/19 0334  HGB 11.8* 11.9*  --  11.6*  HCT 37.0* 39.1  --  37.9*  PLT 265 261  --  250  LABPROT 15.3*  --   --   --   INR 1.2  --   --   --   HEPARINUNFRC  --   --  0.17* 0.18*  CREATININE 1.40* 1.18  --  1.18    Estimated Creatinine Clearance: 75.9 mL/min (by C-G formula based on SCr of 1.18 mg/dL).   Assessment: 89 YOM with history of Afib and mechanical AVR on Coumadin PTA. Patient is s/p cath 06/29/19 and will need redo AVR +/- CABG after foot infection resolved. Toe was amputated on 7/31 and pt will complete 5 more days of PO antibiotics (Keflex).  Coumadin PTA for Afib + mech AVR (2013) - Home dose: 10 mg daily except 12.5 mg on Tu/Th/Sa  AM HL 0.18 - subtherapeutic (goal 0.3-0.7) No s/sx of bleeding. H/H stable plt stable.   Goal of Therapy:  Heparin level 0.3-0.7 units/ml  Monitor platelets by anticoagulation protocol: Yes    Plan:  -Heparin bolus 1500 units x  1 -Increase heparin to 1700 units/hr  -Check 8hr HL -Monitor for s/sx of bleeding   Ch[place]i Goh A. Levada Dy, PharmD, BCPS, FNKF Clinical Pharmacist [place] Please utilize Amion for appropriate phone number to reach the unit pharmacist ([place])

## 2019-07-06 ENCOUNTER — Inpatient Hospital Stay: Admit: 2019-07-06 | Payer: Medicare Other | Admitting: Surgery

## 2019-07-06 LAB — BASIC METABOLIC PANEL
Anion gap: 10 (ref 5–15)
BUN: 33 mg/dL — ABNORMAL HIGH (ref 8–23)
CO2: 25 mmol/L (ref 22–32)
Calcium: 9 mg/dL (ref 8.9–10.3)
Chloride: 102 mmol/L (ref 98–111)
Creatinine, Ser: 1.21 mg/dL (ref 0.61–1.24)
GFR calc Af Amer: >60 mL/min (ref >60)
GFR calc non Af Amer: >60 mL/min (ref >60)
Glucose, Bld: 108 mg/dL — ABNORMAL HIGH (ref 70–99)
Potassium: 4 mmol/L (ref 3.5–5.1)
Sodium: 137 mmol/L (ref 135–145)

## 2019-07-06 LAB — GLUCOSE, CAPILLARY
Glucose-Capillary: >600 mg/dL (ref 70–99)
Glucose-Capillary: 277 mg/dL — ABNORMAL HIGH (ref 70–99)
Glucose-Capillary: 245 mg/dL — ABNORMAL HIGH (ref 70–99)
Glucose-Capillary: 105 mg/dL — ABNORMAL HIGH (ref 70–99)
Glucose-Capillary: 232 mg/dL — ABNORMAL HIGH (ref 70–99)
Glucose-Capillary: 193 mg/dL — ABNORMAL HIGH (ref 70–99)
Glucose-Capillary: 198 mg/dL — ABNORMAL HIGH (ref 70–99)

## 2019-07-06 LAB — HEPARIN LEVEL (UNFRACTIONATED): Heparin Unfractionated: 0.48 IU/mL (ref 0.30–0.70)

## 2019-07-06 LAB — CBC
HCT: 38.7 % — ABNORMAL LOW (ref 39.0–52.0)
Hemoglobin: 12.1 g/dL — ABNORMAL LOW (ref 13.0–17.0)
MCH: 25.7 pg — ABNORMAL LOW (ref 26.0–34.0)
MCHC: 31.3 g/dL (ref 30.0–36.0)
MCV: 82.2 fL (ref 80.0–100.0)
Platelets: 228 10*3/uL (ref 150–400)
RBC: 4.71 MIL/uL (ref 4.22–5.81)
RDW: 18.1 % — ABNORMAL HIGH (ref 11.5–15.5)
WBC: 10.6 10*3/uL — ABNORMAL HIGH (ref 4.0–10.5)
nRBC: 0 % (ref 0.0–0.2)

## 2019-07-06 SURGERY — REDO AORTIC VALVE REPLACEMENT (AVR)
Anesthesia: General | Site: Chest

## 2019-07-06 MED ORDER — FUROSEMIDE 10 MG/ML IJ SOLN
80.0000 mg | Freq: Once | INTRAMUSCULAR | Status: AC
  Administered 2019-07-06: 13:00:00 80 mg via INTRAVENOUS
  Filled 2019-07-06: qty 8

## 2019-07-06 MED ORDER — WARFARIN - PHARMACIST DOSING INPATIENT: Freq: Every day | Status: DC

## 2019-07-06 MED ORDER — WARFARIN SODIUM 2.5 MG PO TABS
12.5000 mg | ORAL_TABLET | Freq: Once | ORAL | Status: AC
  Administered 2019-07-06: 12.5 mg via ORAL
  Filled 2019-07-06: qty 1

## 2019-07-06 NOTE — Progress Notes (Signed)
PT Cancellation Note  Patient Details Name: Joseph Hoffman MRN: [id number] DOB: 1951-02-21   Cancelled Treatment:    Reason Eval/Treat Not Completed: Patient declined, no reason specified([name])   Samyiah Halvorsen B Naithan Delage 07/06/2019, 10:23 AM  Elwyn Reach, PT Acute Rehabilitation Services Pager: [phone] Office: [phone]

## 2019-07-06 NOTE — Progress Notes (Signed)
PROGRESS NOTE  [name]:[id number] DOB: 1951-03-06 DOA: 06/26/2019 PCP: Orpah Melter, MD  HPI/Recap of past 24 hours: Joseph Hoffman  is a 68 y.o. male, with medical history significant ofhistory significant ofhypertension, diabetes mellitus, asthma,sCHFwith EF 45%, CAD, atrial fibrillation on Coumadin,AS, s/p ofAVR with mechanical valveon coumadin, OSA on CPAP, CKD stage III, depression with anxiety, who presents with shortness of breath, and chest pain, as well he reports worsening left great toe ulcer with surrounding cellulitis  Patient reports he started to complain of chest pressure over last 48 hours, accompanied by dyspnea, mainly upon exertion, he did take sublingual nitro, with improvement of his chest pain, but today reported did not have any significant event on his chest pain, patient with known mechanical aortic valve insufficiency, plan to have urgent cath by Dr. Claiborne Billings next week, for anticipated aortic valve replacement by CT surgery next month.  Patient was noted to have left lower extremity diabetic great toe ulcer, report is been going on for last 2 weeks, with some drainage, as well with surrounding cellulitis, he denies any fever, chills, he did not seek any medical care for it.  In the ED patient was hypertensive, mildly tachypneic, retinae elevated at 2.1 from baseline 1.8, mild leukocytosis at 14 point 9K, high-sensitivity troponins were elevated at 96>99, INR supratherapeutic at 3.9, COVID-19 is negative, EKG showing A. fib, patient was seen by cardiology for chest pain, and hospitalist were requested to admit for multiple medical problems, with a presentation of infected diabetic ulcer, gangrenous left toe with cellulitis.    07/06/19: Patient was seen and examined at his bedside this morning.  No complaints.  Discussed with cardiothoracic surgery, Dr. Mohammed Kindle regarding possible surgical intervention during this hospitalization, given his acute gangrenous  foot and infection, he will defer any surgical intervention following resolution of his active infection.  Patient is disappointed regarding this decision, but understands that active infection needs to be addressed prior to any invasive procedures.  Patient denies headache, no fever/chills/night sweats, no chest pain, no palpitations, no shortness of breath, no abdominal pain, no weakness.  No acute events overnight per nursing staff.  Assessment/Plan: Active Problems:   OSA on CPAP   Chronic anticoagulation   AS (aortic stenosis)   Essential hypertension   Acute combined systolic and diastolic heart failure (HCC)   CKD (chronic kidney disease), stage III (HCC)   Acute on chronic systolic (congestive) heart failure (HCC)   Depression   Foot ulcer, left (HCC)   Aortic valve regurgitation   SOB (shortness of breath)   Aortic valve disease  Sepsis secondary to L great toe gangrenous diabetic ulcer with surrounding cellulitis, POA Presented with leukocytosis with WBC 14 K, tachypnea with respiratory rate 31, Elevated CRP 9.5, sed rate 25.  Patient was started on broad-spectrum IV antibiotics with vancomycin and Rocephin.  Vascular surgery was consulted and patient underwent ray amputation of left great toe by vascular surgery, Dr. Carlis Abbott on 07/03/2019.  Blood cultures x2 showed no growth x5 days.  Infectious disease was consulted and antibiotics were de-escalated to Keflex. --Continue Keflex 500 mg p.o. 3 times daily, with projected end date 07/09/2019.  AKI on CKD2 Presented with creatinine of 2.19 with GFR of 29. Back to his baseline creatinine 1.1 with GFR greater than 60 on 07/04/2019.  Etiology likely secondary to volume overload versus ATN from underlying sepsis as above. --net positive 35mL past 24h and negative 7.5L since admission --Cr down to 1.21 --Continue to avoid nephrotoxins --  Continue to monitor urine output and volume status --Continue to monitor renal function  daily  Uncontrolled diabetes, type II with hyperglycemia Hemoglobin A1c 8.7 on 05/13/2019.  Diabetic educator following --Continue Lantus 35 units twice daily --Continue NovoLog 20 units before meals --Continue close monitoring of CBGs.  Acute on chronic combined diastolic and systolic CHF Presented with BNP greater than 600.  Chest x-ray with cardiomegaly and prominent bronchovascular markings; no consolidation. --Cardiology following, appreciate assistance --s/p right/left heart catheterization 06/29/2019 --Continues on IV diuresis with furosemide per cardiology --net positive 341mL past 24h and negative 7.5L since admission --Weight 256 pounds this morning --Cardiology giving an additional dose of furosemide 80 mg IV x1 today --Cardiology may resume Entresto tomorrow --Continue to monitor strict I's and O's and daily weights --Attempting to get weight back to 249-251 pounds per cardiology prior to discharge.  Also plan to resume Entresto twice daily on discharge with recommendations of torsemide 40 mg qAm and 20 mg qPM.   Permanent A. fib with intermittent slow ventricular response Toprol xl and digoxin doses reduced by cardiology; 25mg  p.o. BID and 0.125mg  PO daily respectively --Continue to monitor on telemetry  Severe aortic insufficiency/RV dysfunction Status post aortic valve replacement and awaiting redo aortic valve replacement which has been postponed --Possible CABG at time of redo AVR, postponed due to to infection. --Will need follow-up outpatient with CTS [name] for reschedule of CABG and redo AVR.  Subtherapeutic INR --Restart Coumadin today with heparin bridge; goal INR 2.5-3.5 for mechanical AV valve  Acute on chronic hypoxic respiratory failure likely secondary to acute on chronic combined CHF Etiology likely secondary to volume overload.  Now weaned off supplemental oxygen. --Continue to monitor vital signs closely with SPO2  Severe aortic regurgitation post  mechanical AVR On Coumadin, held due to possible procedure --Continue heparin drip  Chest pain, resolved   OSA, compliant Continue CPAP at night  Hyperlipidemia Intolerant to statin On fenofibrate, icosapent, and zetia     Code Status: Full code  Family Communication: None  Disposition Plan: Discharge to home possibly in 2-3 days, pending cardiology signed off, cardiothoracic surgery plans to follow-up outpatient for consideration of timing of CABG/redo AV valve.  Consultants:  Cardiology  Vascular surgery  Infectious disease  Procedures:  Heart cath on 06/29/2019  Left great toe amputation 07/03/19 by vascular surgery Dr. Carlis Abbott  Antimicrobials:  IV vancomycin, Rocephin  IV Flagyl  Keflex 8/1>>  DVT prophylaxis: Heparin drip.  Objective: Vitals:   07/05/19 2101 07/05/19 2126 07/05/19 2201 07/06/19 0609  BP: 138/60   135/68  Pulse: 74  78 66  Resp: 18   18  Temp: 97.9 F (36.6 C)   98.9 F (37.2 C)  TempSrc: Oral     SpO2: 100% 98%  99%  Weight:    116.5 kg  Height:        Intake/Output Summary (Last 24 hours) at 07/06/2019 1150 Last data filed at 07/06/2019 1015 Gross per 24 hour  Intake 4109.33 ml  Output 3401 ml  Net 708.33 ml   Filed Weights   07/04/19 0657 07/05/19 0500 07/06/19 0609  Weight: 117.4 kg 115.6 kg 116.5 kg    Exam:   General: 68 y.o. year-old male obese in no acute distress.  Alert and oriented x3.    Cardiovascular: Irregular rate and rhythm with no rubs or gallops.  Mechanical click appreciated, no JVD or thyromegaly noted.  1+ pitting edema bilateral lower extremities  Respiratory: Clear to auscultation no wheezes or  rales.  Poor inspiratory effort.    Abdomen: Obese nontender nondistended hypoactive bowel sounds present.  Musculoskeletal: Trace lower extremity edema bilaterally.  Left foot in surgical dressing.    Psychiatry: Mood is appropriate for condition and setting.   Data Reviewed: CBC: Recent Labs  Lab  07/02/19 0511 07/03/19 0536 07/04/19 0518 07/05/19 0334 07/06/19 0536  WBC 9.9 10.4 11.0* 11.5* 10.6*  HGB 11.6* 11.8* 11.9* 11.6* 12.1*  HCT 37.5* 37.0* 39.1 37.9* 38.7*  MCV 83.5 83.1 84.3 84.8 82.2  PLT 245 265 261 250 790   Basic Metabolic Panel: Recent Labs  Lab 07/02/19 0511 07/02/19 1628 07/02/19 2145 07/03/19 0536 07/04/19 0518 07/05/19 0334 07/06/19 0536  NA 136 132*  --  134* 137 136 137  K 4.2 5.0  --  4.0 4.1 3.7 4.0  CL 105 99  --  103 104 101 102  CO2 22 24  --  22 26 24 25   GLUCOSE 156* 573* 582* 313* 201* 109* 108*  BUN 44* 43*  --  42* 37* 36* 33*  CREATININE 1.36* 1.70*  --  1.40* 1.18 1.18 1.21  CALCIUM 8.7* 8.7*  --  9.0 9.0 8.9 9.0  MG 2.5*  --   --   --   --   --   --    GFR: Estimated Creatinine Clearance: 73.8 mL/min (by C-G formula based on SCr of 1.21 mg/dL). Liver Function Tests: No results for input(s): AST, ALT, ALKPHOS, BILITOT, PROT, ALBUMIN in the last 168 hours. No results for input(s): LIPASE, AMYLASE in the last 168 hours. No results for input(s): AMMONIA in the last 168 hours. Coagulation Profile: Recent Labs  Lab 06/30/19 0551 07/01/19 0515 07/02/19 0511 07/03/19 0536  INR 1.4* 1.4* 1.4* 1.2   Cardiac Enzymes: No results for input(s): CKTOTAL, CKMB, CKMBINDEX, TROPONINI in the last 168 hours. BNP (last 3 results) No results for input(s): PROBNP in the last 8760 hours. HbA1C: No results for input(s): HGBA1C in the last 72 hours. CBG: Recent Labs  Lab 07/05/19 0619 07/05/19 1149 07/05/19 1604 07/05/19 2101 07/06/19 0610  GLUCAP 96 202* 114* 194* 105*   Lipid Profile: No results for input(s): CHOL, HDL, LDLCALC, TRIG, CHOLHDL, LDLDIRECT in the last 72 hours. Thyroid Function Tests: No results for input(s): TSH, T4TOTAL, FREET4, T3FREE, THYROIDAB in the last 72 hours. Anemia Panel: No results for input(s): VITAMINB12, FOLATE, FERRITIN, TIBC, IRON, RETICCTPCT in the last 72 hours. Urine analysis:    Component Value  Date/Time   COLORURINE YELLOW 09/25/2012 1506   APPEARANCEUR CLEAR 09/25/2012 1506   LABSPEC 1.045 (H) 09/25/2012 1506   PHURINE 6.0 09/25/2012 1506   GLUCOSEU >1000 (A) 09/25/2012 1506   HGBUR NEGATIVE 09/25/2012 1506   BILIRUBINUR NEGATIVE 09/25/2012 1506   KETONESUR NEGATIVE 09/25/2012 1506   PROTEINUR NEGATIVE 09/25/2012 1506   UROBILINOGEN 0.2 09/25/2012 1506   NITRITE NEGATIVE 09/25/2012 1506   LEUKOCYTESUR NEGATIVE 09/25/2012 1506   Sepsis Labs: @LABRCNTIP (procalcitonin:4,lacticidven:4)  ) Recent Results (from the past 240 hour(s))  SARS Coronavirus 2 (CEPHEID - Performed in [facility] hospital lab), Hosp Order     Status: None   Collection Time: 06/26/19  1:15 PM   Specimen: Nasopharyngeal Swab  Result Value Ref Range Status   SARS Coronavirus 2 NEGATIVE NEGATIVE Final    Comment: (NOTE) If result is NEGATIVE SARS-CoV-2 target nucleic acids are NOT DETECTED. The SARS-CoV-2 RNA is generally detectable in upper and lower  respiratory specimens during the acute phase of infection. The lowest  concentration of SARS-CoV-2 viral copies this assay can detect is 250  copies / mL. A negative result does not preclude SARS-CoV-2 infection  and should not be used as the sole basis for treatment or other  patient management decisions.  A negative result may occur with  improper specimen collection / handling, submission of specimen other  than nasopharyngeal swab, presence of viral mutation(s) within the  areas targeted by this assay, and inadequate number of viral copies  (<250 copies / mL). A negative result must be combined with clinical  observations, patient history, and epidemiological information. If result is POSITIVE SARS-CoV-2 target nucleic acids are DETECTED. The SARS-CoV-2 RNA is generally detectable in upper and lower  respiratory specimens dur ing the acute phase of infection.  Positive  results are indicative of active infection with SARS-CoV-2.  Clinical    correlation with patient history and other diagnostic information is  necessary to determine patient infection status.  Positive results do  not rule out bacterial infection or co-infection with other viruses. If result is PRESUMPTIVE POSTIVE SARS-CoV-2 nucleic acids MAY BE PRESENT.   A presumptive positive result was obtained on the submitted specimen  and confirmed on repeat testing.  While 2019 novel coronavirus  (SARS-CoV-2) nucleic acids may be present in the submitted sample  additional confirmatory testing may be necessary for epidemiological  and / or clinical management purposes  to differentiate between  SARS-CoV-2 and other Sarbecovirus currently known to infect humans.  If clinically indicated additional testing with an alternate test  methodology [phone]) is advised. The SARS-CoV-2 RNA is generally  detectable in upper and lower respiratory sp ecimens during the acute  phase of infection. The expected result is Negative. Fact Sheet for Patients:  StrictlyIdeas.no Fact Sheet for Healthcare Providers: BankingDealers.co.za This test is not yet approved or cleared by the [place] FDA and has been authorized for detection and/or diagnosis of SARS-CoV-2 by FDA under an Emergency Use Authorization (EUA).  This EUA will remain in effect (meaning this test can be used) for the duration of the COVID-19 declaration under Section 564(b)(1) of the Act, 21 U.S.C. section 360bbb-3(b)(1), unless the authorization is terminated or revoked sooner. Performed at [facility], [place] [address] [place], [place] [address]   Culture, blood (routine x 2)     Status: None   Collection Time: 06/26/19  2:46 PM   Specimen: BLOOD  Result Value Ref Range Status   Specimen Description BLOOD RIGHT ANTECUBITAL  Final   Special Requests   Final    BOTTLES DRAWN AEROBIC AND ANAEROBIC Blood Culture results may not be optimal due to an excessive  volume of blood received in culture bottles   Culture   Final    NO GROWTH 5 DAYS Performed at [facility], [place] [address], [place], [place] [address]    Report Status 07/01/2019 FINAL  Final  Culture, blood (routine x 2)     Status: None   Collection Time: 06/26/19  2:52 PM   Specimen: BLOOD LEFT FOREARM  Result Value Ref Range Status   Specimen Description BLOOD LEFT FOREARM  Final   Special Requests   Final    BOTTLES DRAWN AEROBIC AND ANAEROBIC Blood Culture adequate volume   Culture   Final    NO GROWTH 5 DAYS Performed at [facility], [place] [address], [place],  [address]    Report Status 07/01/2019 FINAL  Final  Surgical pcr screen     Status:  None   Collection Time: 07/01/19 11:23 PM   Specimen: Nasal Mucosa; Nasal Swab  Result Value Ref Range Status   MRSA, PCR NEGATIVE NEGATIVE Final   Staphylococcus aureus NEGATIVE NEGATIVE Final    Comment: (NOTE) The Xpert SA Assay (FDA approved for NASAL specimens in patients 56 years of age and older), is one component of a comprehensive surveillance program. It is not intended to diagnose infection nor to guide or monitor treatment. Performed at [facility], [place] [address], [place], [place] [address]   Aerobic/Anaerobic Culture (surgical/deep wound)     Status: None (Preliminary result)   Collection Time: 07/03/19  8:02 AM   Specimen: Bone; Tissue  Result Value Ref Range Status   Specimen Description BONE LEFT METATARSAL  Final   Special Requests PATIENT ON FOLLOWING ANCEF  Final   Gram Stain   Final    RARE WBC PRESENT, PREDOMINANTLY PMN NO ORGANISMS SEEN    Culture   Final    NO GROWTH 2 DAYS NO ANAEROBES ISOLATED; CULTURE IN PROGRESS FOR 5 DAYS Performed at [facility], [place] [address] [place], [place] [address]    Report Status PENDING  Incomplete      Studies: No results found.  Scheduled Meds:  cephALEXin  500 mg Oral TID AC & HS   cholecalciferol  1,000 Units Oral Daily     dextromethorphan-guaiFENesin  2 tablet Oral BID   digoxin  0.125 mg Oral QHS   escitalopram  10 mg Oral QPM   ezetimibe  10 mg Oral Daily   fenofibrate  160 mg Oral Daily   furosemide  80 mg Intravenous Once   hydrALAZINE  12.5 mg Oral BID   Icosapent Ethyl  2 g Oral BID   insulin aspart  0-20 Units Subcutaneous TID WC   insulin aspart  0-5 Units Subcutaneous QHS   insulin aspart  20 Units Subcutaneous TID WC   insulin glargine  35 Units Subcutaneous BID   isosorbide mononitrate  30 mg Oral Daily   metoprolol succinate  25 mg Oral BID   mometasone-formoterol  2 puff Inhalation BID   multivitamin  1 tablet Oral Daily   nutrition supplement (JUVEN)  1 packet Oral BID BM   Ensure Max Protein  11 oz Oral Daily   saccharomyces boulardii  250 mg Oral Q M,W,F   senna-docusate  2 tablet Oral BID   sodium chloride flush  3 mL Intravenous Q12H   sodium chloride flush  3 mL Intravenous Q12H   vitamin C  1,000 mg Oral QPM    Continuous Infusions:  sodium chloride 50 mL/hr at 06/30/19 0635   sodium chloride 250 mL (07/01/19 0634)   sodium chloride     heparin 1,700 Units/hr (07/06/19 1015)     LOS: 10 days     Adie Vilar J [place] DO [facility] Pager [phone]  If 7PM-7AM, please contact night-coverage [email or web address] Password [email or web address] 07/06/2019, 11:50 AM

## 2019-07-06 NOTE — Progress Notes (Signed)
ANTICOAGULATION CONSULT NOTE - Follow Up Consult  Pharmacy Consult for Heparin and Warfarin Indication: mechanical AVR, afib  Allergies  Allergen Reactions  . Iohexol Anaphylaxis  . Niacin And Related     Flushing with immediate realese  . Penicillins Other (See Comments)    Unknown.[name]  Did it involve swelling of the face/tongue/throat, SOB, or low BP? Unknown Did it involve sudden or severe rash/hives, skin peeling, or any reaction on the inside of your mouth or nose? Unknown Did you need to seek medical attention at a hospital or doctor's office? Unknown When did it last happen?Childhood If all above answers are "NO", may proceed with cephalosporin use.    Patient Measurements: Height: 5\' 11"  (180.3 cm) Weight: 256 lb 13.4 oz (116.5 kg) IBW/kg (Calculated) : 75.3 Heparin Dosing Weight: 100 kg  Vital Signs: Temp: 98.7 F (37.1 C) (08/03 1221) Temp Source: Oral (08/03 1221) BP: 142/63 (08/03 1221) Pulse Rate: 76 (08/03 1221)  Labs: Recent Labs    07/04/19 0518  07/05/19 0334 07/05/19 1503 07/06/19 0536  HGB 11.9*  --  11.6*  --  12.1*  HCT 39.1  --  37.9*  --  38.7*  PLT 261  --  250  --  228  HEPARINUNFRC  --    < > 0.18* 0.45 0.48  CREATININE 1.18  --  1.18  --  1.21   < > = values in this interval not displayed.    Estimated Creatinine Clearance: 73.8 mL/min (by C-G formula based on SCr of 1.21 mg/dL).   Medications:  Scheduled:  . cephALEXin  500 mg Oral TID AC & HS  . cholecalciferol  1,000 Units Oral Daily  . dextromethorphan-guaiFENesin  2 tablet Oral BID  . digoxin  0.125 mg Oral QHS  . escitalopram  10 mg Oral QPM  . ezetimibe  10 mg Oral Daily  . fenofibrate  160 mg Oral Daily  . hydrALAZINE  12.5 mg Oral BID  . Icosapent Ethyl  2 g Oral BID  . insulin aspart  0-20 Units Subcutaneous TID WC  . insulin aspart  0-5 Units Subcutaneous QHS  . insulin aspart  20 Units Subcutaneous TID WC  . insulin glargine  35 Units  Subcutaneous BID  . isosorbide mononitrate  30 mg Oral Daily  . metoprolol succinate  25 mg Oral BID  . mometasone-formoterol  2 puff Inhalation BID  . multivitamin  1 tablet Oral Daily  . nutrition supplement (JUVEN)  1 packet Oral BID BM  . Ensure Max Protein  11 oz Oral Daily  . saccharomyces boulardii  250 mg Oral Q M,W,F  . senna-docusate  2 tablet Oral BID  . sodium chloride flush  3 mL Intravenous Q12H  . sodium chloride flush  3 mL Intravenous Q12H  . vitamin C  1,000 mg Oral QPM   Infusions:  . sodium chloride 50 mL/hr at 06/30/19 0635  . sodium chloride 250 mL (07/01/19 0634)  . sodium chloride    . heparin 1,700 Units/hr (07/06/19 1015)    Assessment: 68 yo M on warfarin PTA for mechanical AVR + afib.  Warfarin was held and bridge initiated with heparin for cardiac cath and toe amputation.  Pt also needs redo AVR +/- CABG however this has been delayed due to recent toe infection and amputation.  Pt remains therapeutic on heparin at 1700 units/hr.  To restart warfarin today.  Warfarin dose PTA = 10mg  daily except 12.5mg  TTS  Goal of Therapy:  Heparin level 0.3-0.7 units/ml INR 2.5-3.5 Monitor platelets by anticoagulation protocol: Yes   Plan:  -Continue heparin drip at 1700 units/hr -Warfarin 12.5mg  PO x 1 tonight -Check daily INR, CBC, & heparin level -Monitor for s/sx of bleeding  [employer], Pharm.D., BCPS Clinical Pharmacist Pager: [phone] Clinical phone for 07/06/2019 from 8:30-4:00 is [phone].  **Pharmacist phone directory can now be found on [email or web address] (PW [email or web address]).  Listed under [place].  07/06/2019 12:53 PM

## 2019-07-06 NOTE — Progress Notes (Signed)
Patient has been making progressed with no eventful day. Vitals are stable, has expressed concerns about a post-op shoe. No post-procedure orders to dressed foot or restrictions for left foot.

## 2019-07-06 NOTE — Progress Notes (Signed)
3 Days Post-Op Procedure(s) (LRB): AMPUTATION LEFT GREAT TOE (Left) Subjective:  Denies any shortness of breath or chest pain. Has not been ambulating since left great toe amputation.  Objective: Vital signs in last 24 hours: Temp:  [97.7 F (36.5 C)-98.9 F (37.2 C)] 97.7 F (36.5 C) (08/03 1641) Pulse Rate:  [66-78] 74 (08/03 1641) Cardiac Rhythm: Atrial fibrillation;Bundle branch block (08/03 1921) Resp:  [18] 18 (08/03 0609) BP: (126-142)/(60-74) 126/74 (08/03 1641) SpO2:  [98 %-100 %] 99 % (08/03 1641) Weight:  [116.5 kg] 116.5 kg (08/03 0609)  Hemodynamic parameters for last 24 hours:    Intake/Output from previous day: 08/02 0701 - 08/03 0700 In: 4135.1 [P.O.:1680; I.V.:2455.1] Out: 3801 [Urine:3800; Stool:1] Intake/Output this shift: No intake/output data recorded.  General appearance: alert and cooperative Heart: irregularly irregular rhythm, crisp mechanical valve click, 3/6 systolic murmur RSB, 3/6 diastolic murmur at apex. Lungs: clear to auscultation bilaterally Extremities: mild bilateral lower extremity edema. left foot in dressing.  Lab Results: Recent Labs    07/05/19 0334 07/06/19 0536  WBC 11.5* 10.6*  HGB 11.6* 12.1*  HCT 37.9* 38.7*  PLT 250 228   BMET:  Recent Labs    07/05/19 0334 07/06/19 0536  NA 136 137  K 3.7 4.0  CL 101 102  CO2 24 25  GLUCOSE 109* 108*  BUN 36* 33*  CREATININE 1.18 1.21  CALCIUM 8.9 9.0    PT/INR: No results for input(s): LABPROT, INR in the last 72 hours. ABG    Component Value Date/Time   PHART 7.430 06/29/2019 1050   HCO3 24.1 06/29/2019 1050   TCO2 25 06/29/2019 1050   ACIDBASEDEF 1.0 09/29/2012 2102   O2SAT 97.0 06/29/2019 1050   CBG (last 3)  Recent Labs    07/06/19 0610 07/06/19 1218 07/06/19 1638  GLUCAP 105* 232* 193*    Assessment/Plan:  1. Severe periprosthetic AI with acute on chronic biventricular heart failure, LVEF 30-35% with severe RV dysfunction. Hemodynamics have been  optimized by heart failure and cardiology teams. He has diuresed 7 L since admission.   2.  Multivessel CAD by cath 7/27. He has diffusely diseased diabetic vessels with 80% OM2 and 80% ostial PDA, neither of which is "large" as described on cath. With redo surgery, morbid obesity and DM I don't know if either of these will be graftable.  3.  Stage IV CKD on admission improved with management of his heart failure with creat now of 1.2.  4. Gangrenous infection of left great toe with vascular insufficiency requiring amputation.  He ultimately will require redo AVR/root replacement with possible CABG but I would not do this until we are sure that he is going to resolve the infection in his foot and heal the amputation site. He is at high risk for complications and death after heart surgery without adding the risk of a non-healing infected foot amputation. I think he should be started back on Coumadin and sent home with heart failure meds per heart failure team and be followed up in my office in a few weeks. If his foot heals up then I would plan to do surgery at that time.  LOS: 10 days    Gaye Pollack 07/06/2019

## 2019-07-06 NOTE — Progress Notes (Addendum)
Progress Note  Patient Name: Joseph Hoffman Date of Encounter: 07/06/2019  Primary Cardiologist: Shelva Majestic, MD   Subjective   Breathing and chest pain resolved  Inpatient Medications    Scheduled Meds: . cephALEXin  500 mg Oral TID AC & HS  . cholecalciferol  1,000 Units Oral Daily  . dextromethorphan-guaiFENesin  2 tablet Oral BID  . digoxin  0.125 mg Oral QHS  . escitalopram  10 mg Oral QPM  . ezetimibe  10 mg Oral Daily  . fenofibrate  160 mg Oral Daily  . hydrALAZINE  12.5 mg Oral BID  . Icosapent Ethyl  2 g Oral BID  . insulin aspart  0-20 Units Subcutaneous TID WC  . insulin aspart  0-5 Units Subcutaneous QHS  . insulin aspart  20 Units Subcutaneous TID WC  . insulin glargine  35 Units Subcutaneous BID  . isosorbide mononitrate  30 mg Oral Daily  . metoprolol succinate  25 mg Oral BID  . mometasone-formoterol  2 puff Inhalation BID  . multivitamin  1 tablet Oral Daily  . nutrition supplement (JUVEN)  1 packet Oral BID BM  . Ensure Max Protein  11 oz Oral Daily  . saccharomyces boulardii  250 mg Oral Q M,W,F  . senna-docusate  2 tablet Oral BID  . sodium chloride flush  3 mL Intravenous Q12H  . sodium chloride flush  3 mL Intravenous Q12H  . vitamin C  1,000 mg Oral QPM   Continuous Infusions: . sodium chloride 50 mL/hr at 06/30/19 0635  . sodium chloride 250 mL (07/01/19 0634)  . sodium chloride    . heparin 1,700 Units/hr (07/06/19 1015)   PRN Meds: sodium chloride, sodium chloride, acetaminophen, albuterol, guaiFENesin-dextromethorphan, hydrALAZINE, HYDROmorphone (DILAUDID) injection, labetalol, loperamide, nitroGLYCERIN, ondansetron (ZOFRAN) IV, oxyCODONE, sodium chloride flush, sodium chloride flush, sodium chloride flush   Vital Signs    Vitals:   07/05/19 2101 07/05/19 2126 07/05/19 2201 07/06/19 0609  BP: 138/60   135/68  Pulse: 74  78 66  Resp: 18   18  Temp: 97.9 F (36.6 C)   98.9 F (37.2 C)  TempSrc: Oral     SpO2: 100% 98%  99%   Weight:    116.5 kg  Height:        Intake/Output Summary (Last 24 hours) at 07/06/2019 1024 Last data filed at 07/06/2019 0820 Gross per 24 hour  Intake 3775.08 ml  Output 3401 ml  Net 374.08 ml   Last 3 Weights 07/06/2019 07/05/2019 07/04/2019  Weight (lbs) 256 lb 13.4 oz 254 lb 13.6 oz 258 lb 13.1 oz  Weight (kg) 116.5 kg 115.6 kg 117.4 kg      Telemetry    Atrial fibrillation at rate of 60-80s - Personally Reviewed  ECG    No new tracing   Physical Exam   GEN: No acute distress.   Neck: No JVD Cardiac: Ir Ir , + murmurs, rubs, or gallops.  Respiratory: Clear to auscultation bilaterally. GI: Soft, nontender, non-distended  MS: 1+ BL LE edema; s/p L great toe amputation with dressing  Neuro:  Nonfocal  Psych: Normal affect   Labs    High Sensitivity Troponin:   Recent Labs  Lab 06/26/19 1315 06/26/19 1446  TROPONINIHS 96* 99*       Chemistry Recent Labs  Lab 07/04/19 0518 07/05/19 0334 07/06/19 0536  NA 137 136 137  K 4.1 3.7 4.0  CL 104 101 102  CO2 26 24 25   GLUCOSE 201* 109*  108*  BUN 37* 36* 33*  CREATININE 1.18 1.18 1.21  CALCIUM 9.0 8.9 9.0  GFRNONAA >60 >60 >60  GFRAA >60 >60 >60  ANIONGAP 7 11 10      Hematology Recent Labs  Lab 07/04/19 0518 07/05/19 0334 07/06/19 0536  WBC 11.0* 11.5* 10.6*  RBC 4.64 4.47 4.71  HGB 11.9* 11.6* 12.1*  HCT 39.1 37.9* 38.7*  MCV 84.3 84.8 82.2  MCH 25.6* 26.0 25.7*  MCHC 30.4 30.6 31.3  RDW 18.0* 17.7* 18.1*  PLT 261 250 228    Radiology    No results found.  Cardiac Studies   RIGHT HEART CATH AND CORONARY ANGIOGRAPHY 06/29/19  Conclusion  Conclusions: 1. Multivessel coronary artery disease, including 50% mid LAD, 80% large OM2, 50% mid RCA, and 80-90% ostial rPDA stenoses. 2. Mildly to moderately elevated left and right heart filling pressures. 3. Mildly reduced Fick cardiac output/index.  Recommendations: 1. Gentle post-cath hydration for goal net even fluid balance today.  Consider  gentle diuresis, as renal function tolerates, beginning tomorrow. 2. Ongoing workup for redo aortic valve replacement +/- CABG per Drs. Claiborne Billings and [place]. 3. Initiate heparin infusion 2 hours after TR band removal. 4. Aggressive secondary prevention.   Echo 05/29/19 1. The left ventricle has moderate-severely reduced systolic function, with an ejection fraction of 30-35%. Left ventricular diffuse hypokinesis.  2. The right ventricle has severely reduced systolic function. The cavity was mildly enlarged.  3. Left atrial size was severely dilated.  4. Right atrial size was severely dilated.  5. The mitral valve is grossly normal. No evidence of mitral valve stenosis.  6. The tricuspid valve was grossly normal.  7. Aortic valve regurgitation is severe by color flow Doppler.  8. There is evidence of mild plaque in the descending aorta.  9. Moderate to severe global reduction in LV systolic function; mild RVE with severe RV dysfunction; severe biatrial enlargement; s/p mechanical AVR with mean gradient of 14 mmHg (both leaflets appear to be mobile); severe perivalvular AI; mild MR and  TR.  Patient Profile     [name] a 68 y.o.malewith a PMH of permanent atrial fibrillation, chronic combined CHF,St. JudeAVR in 2013, HTN, HLD, OSA on CPAP, DM type 2, CKD stage 3who has recently was found to have a decline in LV fxn with significant AVR perivalvular leakand recent nonhealing foot ulcer with development of gangrenous plantar aspect of left great toe.  Assessment & Plan    1. Acute on chronic Biventricular heart failure - ECHO 05/2019 EF 30-35% Severe AI, severe RV dysfunction - Last seen by heart failure team on Thursday with goal weight of 249-251 lbs and recommended starting Entresto 24/26 mg BID once Scr less than 1.8. May dc hydr/niro. -Net +334 yesterday and -7.5L since admit.  - volume overloaded by exam - Will give IV lasix 80 x 1 today - Continue Toprol XL 25mg  qd, Imdur  30mg  qd, digoxin and hydralazine 12.5mg  BID - May consider adding Entresto tomorrow  2. CAD - No chest pain. Cath with multivessel CAD on 7/27 - Possible CABG at time of re-do AVR - Continue current medical therapy  3. HLD - 05/13/2019: Cholesterol 111; HDL 19; LDL Cholesterol 74; Triglycerides 89; VLDL 18  - intolerant of statins. On Vascepa & zetia  4. Severe Aortic insuffiency in setting of mechanical AVR - Pending re-do AVR +/- CABG once resolution of infection  5. Persistent atrial fibrillation - Rate controlled - continue BB and digoxin - On heparin  6. CKD IV - Renal function back to normal    For questions or updates, please contact [place] Please consult [email or web address] for contact info under        Signed, Leanor Kail, PA  07/06/2019, 10:24 AM    I have examined the patient and reviewed assessment and plan and discussed with patient.  Agree with above as stated.  Urinating well with IV diuresis.  Renal function getting better.  Ultimately will need CABG /AVR when foot ulcer heals.  Pain is controlled.   Larae Grooms

## 2019-07-06 NOTE — Progress Notes (Signed)
Patient will self administer CPAP when he is ready for bed.  Hospital provided machine and mask.  Auto titration mode used, min 4. Max 20.  3L oxygen bleed in as per home regimen.  Patient is compliant with CPAP and is familiar with equipment and procedure.

## 2019-07-07 ENCOUNTER — Encounter (HOSPITAL_COMMUNITY): Payer: Self-pay | Admitting: Vascular Surgery

## 2019-07-07 DIAGNOSIS — F329 Major depressive disorder, single episode, unspecified: Secondary | ICD-10-CM

## 2019-07-07 DIAGNOSIS — I2510 Atherosclerotic heart disease of native coronary artery without angina pectoris: Secondary | ICD-10-CM

## 2019-07-07 DIAGNOSIS — I350 Nonrheumatic aortic (valve) stenosis: Secondary | ICD-10-CM

## 2019-07-07 LAB — CBC
HCT: 37.8 % — ABNORMAL LOW (ref 39.0–52.0)
Hemoglobin: 11.6 g/dL — ABNORMAL LOW (ref 13.0–17.0)
MCH: 25.7 pg — ABNORMAL LOW (ref 26.0–34.0)
MCHC: 30.7 g/dL (ref 30.0–36.0)
MCV: 83.6 fL (ref 80.0–100.0)
Platelets: 228 10*3/uL (ref 150–400)
RBC: 4.52 MIL/uL (ref 4.22–5.81)
RDW: 17.7 % — ABNORMAL HIGH (ref 11.5–15.5)
WBC: 10 10*3/uL (ref 4.0–10.5)
nRBC: 0 % (ref 0.0–0.2)

## 2019-07-07 LAB — BASIC METABOLIC PANEL
Anion gap: 9 (ref 5–15)
BUN: 35 mg/dL — ABNORMAL HIGH (ref 8–23)
CO2: 30 mmol/L (ref 22–32)
Calcium: 9.2 mg/dL (ref 8.9–10.3)
Chloride: 100 mmol/L (ref 98–111)
Creatinine, Ser: 1.33 mg/dL — ABNORMAL HIGH (ref 0.61–1.24)
GFR calc Af Amer: >60 mL/min (ref >60)
GFR calc non Af Amer: 54 mL/min — ABNORMAL LOW (ref >60)
Glucose, Bld: 68 mg/dL — ABNORMAL LOW (ref 70–99)
Potassium: 4 mmol/L (ref 3.5–5.1)
Sodium: 139 mmol/L (ref 135–145)

## 2019-07-07 LAB — GLUCOSE, CAPILLARY
Glucose-Capillary: 60 mg/dL — ABNORMAL LOW (ref 70–99)
Glucose-Capillary: 82 mg/dL (ref 70–99)
Glucose-Capillary: 161 mg/dL — ABNORMAL HIGH (ref 70–99)
Glucose-Capillary: 136 mg/dL — ABNORMAL HIGH (ref 70–99)
Glucose-Capillary: 143 mg/dL — ABNORMAL HIGH (ref 70–99)

## 2019-07-07 LAB — HEPARIN LEVEL (UNFRACTIONATED): Heparin Unfractionated: 0.52 IU/mL (ref 0.30–0.70)

## 2019-07-07 LAB — PROTIME-INR
INR: 1.1 (ref 0.8–1.2)
Prothrombin Time: 14.2 seconds (ref 11.4–15.2)

## 2019-07-07 MED ORDER — TORSEMIDE 20 MG PO TABS
20.0000 mg | ORAL_TABLET | Freq: Every evening | ORAL | Status: DC
  Administered 2019-07-07 – 2019-07-09 (×3): 20 mg via ORAL
  Filled 2019-07-07 (×3): qty 1

## 2019-07-07 MED ORDER — SACUBITRIL-VALSARTAN 24-26 MG PO TABS
1.0000 | ORAL_TABLET | Freq: Two times a day (BID) | ORAL | Status: DC
  Administered 2019-07-07 – 2019-07-10 (×7): 1 via ORAL
  Filled 2019-07-07 (×7): qty 1

## 2019-07-07 MED ORDER — INSULIN GLARGINE 100 UNIT/ML ~~LOC~~ SOLN
32.0000 [IU] | Freq: Two times a day (BID) | SUBCUTANEOUS | Status: DC
  Administered 2019-07-07 – 2019-07-10 (×6): 32 [IU] via SUBCUTANEOUS
  Filled 2019-07-07 (×7): qty 0.32

## 2019-07-07 MED ORDER — GLUCERNA SHAKE PO LIQD
237.0000 mL | Freq: Two times a day (BID) | ORAL | Status: DC
  Administered 2019-07-07 – 2019-07-10 (×6): 237 mL via ORAL

## 2019-07-07 MED ORDER — WARFARIN SODIUM 2.5 MG PO TABS
12.5000 mg | ORAL_TABLET | Freq: Once | ORAL | Status: AC
  Administered 2019-07-07: 12.5 mg via ORAL
  Filled 2019-07-07: qty 1

## 2019-07-07 MED ORDER — TORSEMIDE 20 MG PO TABS
40.0000 mg | ORAL_TABLET | Freq: Every morning | ORAL | Status: DC
  Administered 2019-07-07 – 2019-07-10 (×4): 40 mg via ORAL
  Filled 2019-07-07 (×4): qty 2

## 2019-07-07 NOTE — Progress Notes (Signed)
[facility]  Subjective  - No complaints   Objective (!) 124/45 76 98.2 F (36.8 C) (Oral) 16 97%  Intake/Output Summary (Last 24 hours) at 07/07/2019 0754 Last data filed at 07/07/2019 0746 Gross per 24 hour  Intake 2318.82 ml  Output 1200 ml  Net 1118.82 ml    Left great toe amp c/d/i  Laboratory Lab Results: Recent Labs    07/06/19 0536 07/07/19 0350  WBC 10.6* 10.0  HGB 12.1* 11.6*  HCT 38.7* 37.8*  PLT 228 228   BMET Recent Labs    07/06/19 0536 07/07/19 0350  NA 137 139  K 4.0 4.0  CL 102 100  CO2 25 30  GLUCOSE 108* 68*  BUN 33* 35*  CREATININE 1.21 1.33*  CALCIUM 9.0 9.2    COAG Lab Results  Component Value Date   INR 1.1 07/07/2019   INR 1.2 07/03/2019   INR 1.4 (H) 07/02/2019   No results found for: PTT  Assessment/Planning:  68 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  Severe tibial disease, peroneal runoff.  Left great toe amp looks good, skins margins viable, dressing changed this am.  Can be discharged from our standpoint and will arrange 3 week follow-up for suture removal/wound check.  Marty Heck 07/07/2019 7:54 AM --

## 2019-07-07 NOTE — Progress Notes (Signed)
Patient CBG 24ml/dl.  4 oz of Orange juice given.  Will recheck in 15 minutes.

## 2019-07-07 NOTE — Progress Notes (Signed)
Nutrition Follow-up  DOCUMENTATION CODES:   Obesity unspecified  INTERVENTION:   -D/c 1 packet Juven BID, each packet provides 95 calories, 2.5 grams of protein (collagen), and 9.8 grams of carbohydrate (3 grams sugar); also contains 7 grams of L-arginine and L-glutamine, 300 mg vitamin C, 15 mg vitamin E, 1.2 mcg vitamin B-12, 9.5 mg zinc, 200 mg calcium, and 1.5 g  Calcium Beta-hydroxy-Beta-methylbutyrate to support wound healing -Glucerna Shake po BID, each supplement provides 220 kcal and 10 grams of protein -Continue Ensure Max po daily, each supplement provides 150 kcal and 30 grams of protein -Continue MVI with minerals daily  NUTRITION DIAGNOSIS:   Increased nutrient needs related to wound healing(cellulitis; left leg, diabetic ulcer; left; great toe) as evidenced by estimated needs.  Ongoing  GOAL:   Patient will meet greater than or equal to 90% of their needs  Progressing   MONITOR:   Skin, Supplement acceptance, PO intake, Labs, Weight trends  REASON FOR ASSESSMENT:   Consult Wound healing  ASSESSMENT:   68 year old male with past medical history significant of a-fib, chronic combined CHF, AVR in 2013, HTN, OSA on CPAP, T2DM, CKD3, and hepatitis. Pt admitted with LLE cellulitis and acute on chronic biventricular HF with severe aortic regurgitation  7/30- s/p Ultrasound-guided access of the right common femoral artery;  Aortogram; Bilateral lower extremity arteriogram with runoff; Mynx closure of the right common femoral artery 7/31- s/p Procedure: Ray amputation of left great toe  Reviewed I/O's: *79 ml x 24 hours and -6.6 L since admission  UOP: 1.2 L x 24 hours  Pt on phone with family members at time of visit.   Case discussed with RN, who reports pt with good appetite (documented meal completion 100%). Pt is refusing Juven supplements, however, likes Ensure max. Per RN, awaiting for therapeutic INR prior to discharge. Per CVTS notes, will awaiting until  foot infection resolves prior to redo AVR/root replacement with possible CABG.   Labs reviewed: CBGS: 60-198 (inpatient orders for glycemic control are 20 units insulin aspart TID with meals, 35 units insulin glargine BID, and 0-20 units insulin aspart TID with meals).   Diet Order:   Diet Order            Diet heart healthy/carb modified Room service appropriate? Yes; Fluid consistency: Thin  Diet effective now              EDUCATION NEEDS:   No education needs have been identified at this time  Skin:  Skin Assessment: Skin Integrity Issues: Skin Integrity Issues:: Incisions Diabetic Ulcer: s/p lt great toe amputation Incisions: lt great toe s/p amputation  Last BM:  07/06/19  Height:   Ht Readings from Last 1 Encounters:  06/26/19 5\' 11"  (1.803 m)    Weight:   Wt Readings from Last 1 Encounters:  07/07/19 115.3 kg    Ideal Body Weight:  78.2 kg  BMI:  Body mass index is 35.45 kg/m.  Estimated Nutritional Needs:   Kcal:  2200-2400  Protein:  120-130  Fluid:  >2.2L    Eilleen Davoli A. Jimmye Norman, RD, LDN, [place] Registered Dietitian II Certified Diabetes Care and Education Specialist Pager: [phone] After hours Pager: [phone]

## 2019-07-07 NOTE — Progress Notes (Signed)
Hypoglycemic Event  CBG: 60  Treatment: 4oz of OJ  Symptoms: lethargic  Follow-up CBG: Time: 0640 CBG Result: 82  Possible Reasons for Event: insulins   Comments/MD notified:  n/a    Ryan Lions

## 2019-07-07 NOTE — Progress Notes (Signed)
Orthopedic Tech Progress Note Patient Details:  Joseph Hoffman [birth year] [id number]  Ortho Devices Type of Ortho Device: Postop shoe/boot Ortho Device/Splint Location: left Ortho Device/Splint Interventions: Application   Post Interventions Patient Tolerated: Well Instructions Provided: Care of device   [place] Pink 07/07/2019, 10:55 AM

## 2019-07-07 NOTE — Progress Notes (Addendum)
Inpatient Diabetes Program Recommendations  AACE/ADA: New Consensus Statement on Inpatient Glycemic Control (2015)  Target Ranges:  Prepandial:   less than 140 mg/dL      Peak postprandial:   less than 180 mg/dL (1-2 hours)      Critically ill patients:  140 - 180 mg/dL   Lab Results  Component Value Date   GLUCAP 82 07/07/2019   HGBA1C 8.7 (H) 05/13/2019    Review of Glycemic Control Results for [name], [name] (MRN [id number]) as of 07/07/2019 10:32  Ref. Range 07/06/2019 12:18 07/06/2019 16:38 07/06/2019 22:25 07/07/2019 06:18 07/07/2019 06:40  Glucose-Capillary Latest Ref Range: 70 - 99 mg/dL 232 (H) 193 (H) 198 (H) 60 (L) 82    Inpatient Diabetes Program Recommendations:   -Decrease Lantus to 32 units bid Secure message sent to Dr. [place]  Thank you, Nani Gasser. Jaliana Medellin, RN, MSN, CDE  Diabetes Coordinator Inpatient Glycemic Control Team Team Pager [phone] (8am-5pm) 07/07/2019 10:47 AM

## 2019-07-07 NOTE — Progress Notes (Signed)
Pt has order for post op shoe- paged ortho tech

## 2019-07-07 NOTE — Progress Notes (Signed)
Physical Therapy Treatment Patient Details Name: Joseph Hoffman MRN: [id number] DOB: 28-Mar-1951 Today's Date: 07/07/2019    History of Present Illness Patient is a 68 year old male who presented to the hospital with cellulitus of left lower leg and chest pain. It was found that his aortic valve was dysfunctional.Left great toe infection s/p aortogram 7/30 with amputation 7/31. Plan for CABG and redo AVR in the near future. PMH: Sleep apnea, A-fib, DMII, AVR, CHF, anxiety, aortic stenois    PT Comments    Pt pleasant and eager to mobilize today for toileting. Max assist to don post op shoe for LLE with pt educated for its wear and pt required mod assist for pericare. Pt with improved activity tolerance adhering to heel weight bearing with use of RW. Pt educated for sternal precautions and mobility restrictions post op with pt encouraged to continue short distance ambulation as well as below HEP. Will continue to follow acutely with mobility sufficient at this time for return home.  HR 90 with gait, SpO2 97% on RA  Access Code: [email or web address]  URL: [email or web address]   Exercises Seated March - 20 reps - 1 sets - 3x daily - 7x weekly Seated Long Arc Quad - 20 reps - 1 sets - 3x daily - 7x weekly Sit to Stand - 5 reps - 1 sets - 3x daily - 7x weekly Supine Hip Abduction - 20 reps - 1 sets - 3x daily - 7x weekly   Follow Up Recommendations  No PT follow up     Equipment Recommendations  Rolling walker with 5" wheels    Recommendations for Other Services       Precautions / Restrictions Precautions Precaution Comments: monitor HR and breathing  Required Braces or Orthoses: Other Brace Other Brace: post op shoe LLE Restrictions LLE Weight Bearing: Partial weight bearing Other Position/Activity Restrictions: heel weight bearing    Mobility  Bed Mobility Overal bed mobility: Modified Independent             General bed mobility comments: with use of rail and HOB  elevated able to pivot to EOB  Transfers Overall transfer level: Needs assistance   Transfers: Sit to/from Stand;Stand Pivot Transfers Sit to Stand: Supervision Stand pivot transfers: Supervision       General transfer comment: supervision for lines to stand from bed and from [facility] with cues for hand placement with pivot as pt letting go of RW to reach for other furniture to turn with. Repeated sit to stands in chair without UE assist in prep for cardiac sx  Ambulation/Gait Ambulation/Gait assistance: Min guard Gait Distance (Feet): 40 Feet Assistive device: Rolling walker (2 wheeled) Gait Pattern/deviations: Step-to pattern;Decreased stride length   Gait velocity interpretation: >2.62 ft/sec, indicative of community ambulatory General Gait Details: cues for weight bearing on left heel and sequence. HR 90 with SpO2 97% on RA   Stairs Stairs: Yes Stairs assistance: Min assist Stair Management: Step to pattern;Backwards;With walker Number of Stairs: 2 General stair comments: cues for sequence with assist for RW stability provided and handout   Wheelchair Mobility    Modified Rankin (Stroke Patients Only)       Balance Overall balance assessment: Mild deficits observed, not formally tested                                          Cognition Arousal/Alertness: Awake/alert Behavior During  Therapy: WFL for tasks assessed/performed Overall Cognitive Status: Within Functional Limits for tasks assessed                                        Exercises General Exercises - Lower Extremity Long Arc Quad: AROM;20 reps;Both;Seated Hip ABduction/ADduction: AROM;20 reps;Both;Seated Hip Flexion/Marching: AROM;20 reps;Both;Seated Other Exercises Other Exercises: sit to stand without UE assist x 6 reps    General Comments        Pertinent Vitals/Pain Pain Assessment: No/denies pain    Home Living                      Prior Function             PT Goals (current goals can now be found in the care plan section) Progress towards PT goals: Progressing toward goals    Frequency    Min 2X/week      PT Plan Current plan remains appropriate;Equipment recommendations need to be updated    Co-evaluation              AM-PAC PT "6 Clicks" Mobility   Outcome Measure  Help needed turning from your back to your side while in a flat bed without using bedrails?: A Little Help needed moving from lying on your back to sitting on the side of a flat bed without using bedrails?: None Help needed moving to and from a bed to a chair (including a wheelchair)?: A Little Help needed standing up from a chair using your arms (e.g., wheelchair or bedside chair)?: None Help needed to walk in hospital room?: A Little Help needed climbing 3-5 steps with a railing? : A Little 6 Click Score: 20    End of Session Equipment Utilized During Treatment: Gait belt Activity Tolerance: Patient tolerated treatment well Patient left: in chair;with call bell/phone within reach;with nursing/sitter in room Nurse Communication: Mobility status PT Visit Diagnosis: Unsteadiness on feet (R26.81);Other abnormalities of gait and mobility (R26.89);Difficulty in walking, not elsewhere classified (R26.2)     Time: 6644-0347 PT Time Calculation (min) (ACUTE ONLY): 32 min  Charges:  $Gait Training: 8-22 mins $Therapeutic Exercise: 8-22 mins                     [place] [name] Drown, PT Acute Rehabilitation Services Pager: [phone] Office: May 07/07/2019, 12:31 PM

## 2019-07-07 NOTE — Progress Notes (Signed)
CCMD called to report pause, telemetry reviewed and measured 3.6 sec pause, pt was asleep, no c/o chest pain or sob, pt returned to afib 60-70, paged Bhagat PA to advise

## 2019-07-07 NOTE — Progress Notes (Signed)
ANTICOAGULATION CONSULT NOTE - Follow Up Consult  Pharmacy Consult for Heparin and Warfarin Indication: mechanical AVR, afib  Allergies  Allergen Reactions  . Iohexol Anaphylaxis  . Niacin And Related     Flushing with immediate realese  . Penicillins Other (See Comments)    Unknown.Joseph Kitchenaortic stenosis a child  Did it involve swelling of the face/tongue/throat, SOB, or low BP? Unknown Did it involve sudden or severe rash/hives, skin peeling, or any reaction on the inside of your mouth or nose? Unknown Did you need to seek medical attention at a hospital or doctor's office? Unknown When did it last happen?Childhood If all above answers are "NO", may proceed with cephalosporin use.    Patient Measurements: Height: 5\' 11"  (180.3 cm) Weight: 254 lb 3.2 oz (115.3 kg)(scale a) IBW/kg (Calculated) : 75.3 Heparin Dosing Weight: 100 kg  Vital Signs: Temp: 98.2 F (36.8 C) (08/04 0638) Temp Source: Oral (08/04 1093) BP: 124/45 (08/04 2355) Pulse Rate: 75 (08/04 0815)  Labs: Recent Labs    07/05/19 0334 07/05/19 1503 07/06/19 0536 07/07/19 0350  HGB 11.6*  --  12.1* 11.6*  HCT 37.9*  --  38.7* 37.8*  PLT 250  --  228 228  LABPROT  --   --   --  14.2  INR  --   --   --  1.1  HEPARINUNFRC 0.18* 0.45 0.48 0.52  CREATININE 1.18  --  1.21 1.33*    Estimated Creatinine Clearance: 66.7 mL/min (A) (by C-G formula based on SCr of 1.33 mg/dL (H)).   Medications:  Scheduled:  . cephALEXin  500 mg Oral TID AC & HS  . cholecalciferol  1,000 Units Oral Daily  . dextromethorphan-guaiFENesin  2 tablet Oral BID  . digoxin  0.125 mg Oral QHS  . escitalopram  10 mg Oral QPM  . ezetimibe  10 mg Oral Daily  . fenofibrate  160 mg Oral Daily  . Icosapent Ethyl  2 g Oral BID  . insulin aspart  0-20 Units Subcutaneous TID WC  . insulin aspart  0-5 Units Subcutaneous QHS  . insulin aspart  20 Units Subcutaneous TID WC  . insulin glargine  35 Units Subcutaneous BID  . metoprolol  succinate  25 mg Oral BID  . mometasone-formoterol  2 puff Inhalation BID  . multivitamin  1 tablet Oral Daily  . nutrition supplement (JUVEN)  1 packet Oral BID BM  . Ensure Max Protein  11 oz Oral Daily  . saccharomyces boulardii  250 mg Oral Q M,W,F  . sacubitril-valsartan  1 tablet Oral BID  . senna-docusate  2 tablet Oral BID  . sodium chloride flush  3 mL Intravenous Q12H  . sodium chloride flush  3 mL Intravenous Q12H  . torsemide  20 mg Oral QPM  . torsemide  40 mg Oral q morning - 10a  . vitamin C  1,000 mg Oral QPM  . Warfarin - Pharmacist Dosing Inpatient   Does not apply q1800   Infusions:  . sodium chloride 50 mL/hr at 06/30/19 0635  . sodium chloride 250 mL (07/01/19 0634)  . sodium chloride    . heparin 1,700 Units/hr (07/07/19 0127)    Assessment: 68 yo M on warfarin PTA for mechanical AVR + afib.  Warfarin was held and bridge initiated with heparin for cardiac cath and toe amputation.  Pt also needs redo AVR +/- CABG however this has been delayed due to recent toe infection and amputation.  Pt remains therapeutic on heparin at 1700  units/hr.  Warfarin restarted 8/3.  Will need parenteral overlap until INR therapeutic given mechanical valve. Warfarin dose PTA = 10mg  daily except 12.5mg  TTS  Goal of Therapy:  Heparin level 0.3-0.7 units/ml INR 2.5-3.5 Monitor platelets by anticoagulation protocol: Yes   Plan:  -Continue heparin drip at 1700 units/hr -Warfarin 12.5mg  PO x 1 tonight -Check daily INR, CBC, & heparin level -Monitor for s/sx of bleeding  [employer], Pharm.D., BCPS Clinical Pharmacist Pager: [phone] Clinical phone for 70/03/2019 from 8:30-4:00 is [phone].  **Pharmacist phone directory can now be found on [email or web address] ([email or web address]).  Listed under [place].  07/07/2019 9:08 AM

## 2019-07-07 NOTE — Care Management Important Message (Signed)
Important Message  Patient Details  Name: ELRIDGE STEMM MRN: [id number] Date of Birth: [birth year]   Medicare Important Message Given:  Yes     [name] 07/07/2019, 12:06 PM

## 2019-07-07 NOTE — Progress Notes (Addendum)
Progress Note  Patient Name: Joseph Hoffman Date of Encounter: 07/07/2019  Primary Cardiologist: Shelva Majestic, MD   Subjective   Feeling much better. Edema improving. Dyspnea resolved.   Inpatient Medications    Scheduled Meds:  cephALEXin  500 mg Oral TID AC & HS   cholecalciferol  1,000 Units Oral Daily   dextromethorphan-guaiFENesin  2 tablet Oral BID   digoxin  0.125 mg Oral QHS   escitalopram  10 mg Oral QPM   ezetimibe  10 mg Oral Daily   fenofibrate  160 mg Oral Daily   hydrALAZINE  12.5 mg Oral BID   Icosapent Ethyl  2 g Oral BID   insulin aspart  0-20 Units Subcutaneous TID WC   insulin aspart  0-5 Units Subcutaneous QHS   insulin aspart  20 Units Subcutaneous TID WC   insulin glargine  35 Units Subcutaneous BID   isosorbide mononitrate  30 mg Oral Daily   metoprolol succinate  25 mg Oral BID   mometasone-formoterol  2 puff Inhalation BID   multivitamin  1 tablet Oral Daily   nutrition supplement (JUVEN)  1 packet Oral BID BM   Ensure Max Protein  11 oz Oral Daily   saccharomyces boulardii  250 mg Oral Q M,W,F   senna-docusate  2 tablet Oral BID   sodium chloride flush  3 mL Intravenous Q12H   sodium chloride flush  3 mL Intravenous Q12H   vitamin C  1,000 mg Oral QPM   Warfarin - Pharmacist Dosing Inpatient   Does not apply q1800   Continuous Infusions:  sodium chloride 50 mL/hr at 06/30/19 0635   sodium chloride 250 mL (07/01/19 0634)   sodium chloride     heparin 1,700 Units/hr (07/07/19 0127)   PRN Meds: sodium chloride, sodium chloride, acetaminophen, albuterol, guaiFENesin-dextromethorphan, hydrALAZINE, HYDROmorphone (DILAUDID) injection, labetalol, loperamide, nitroGLYCERIN, ondansetron (ZOFRAN) IV, oxyCODONE, sodium chloride flush, sodium chloride flush, sodium chloride flush   Vital Signs    Vitals:   07/06/19 1641 07/06/19 2008 07/06/19 2045 07/07/19 0638  BP: 126/74 (!) 127/48  (!) 124/45  Pulse: 74 66  76    Resp:  17  16  Temp: 97.7 F (36.5 C) 98.3 F (36.8 C)  98.2 F (36.8 C)  TempSrc: Oral Oral  Oral  SpO2: 99% 97% 98% 97%  Weight:    115.3 kg  Height:        Intake/Output Summary (Last 24 hours) at 07/07/2019 0809 Last data filed at 07/07/2019 6761 Gross per 24 hour  Intake 2318.82 ml  Output 1525 ml  Net 793.82 ml   Last 3 Weights 07/07/2019 07/06/2019 07/05/2019  Weight (lbs) 254 lb 3.2 oz 256 lb 13.4 oz 254 lb 13.6 oz  Weight (kg) 115.304 kg 116.5 kg 115.6 kg      Telemetry    Atrial fibrillation at controlled rate - Personally Reviewed  ECG    No new tracing   Physical Exam   GEN: No acute distress.   Neck: No JVD Cardiac: Irregularly irregular, + murmurs, rubs, or gallops.  Respiratory: Clear to auscultation bilaterally. GI: Soft, nontender, non-distended  MS: trace to 1 + BL LE edema. S/p L great toe amputation  Neuro:  Nonfocal  Psych: Normal affect   Labs    High Sensitivity Troponin:   Recent Labs  Lab 06/26/19 1315 06/26/19 1446  TROPONINIHS 96* 99*      Cardiac EnzymesNo results for input(s): TROPONINI in the last 168 hours. No results for input(s):  TROPIPOC in the last 168 hours.   Chemistry Recent Labs  Lab 07/05/19 0334 07/06/19 0536 07/07/19 0350  NA 136 137 139  K 3.7 4.0 4.0  CL 101 102 100  CO2 24 25 30   GLUCOSE 109* 108* 68*  BUN 36* 33* 35*  CREATININE 1.18 1.21 1.33*  CALCIUM 8.9 9.0 9.2  GFRNONAA >60 >60 54*  GFRAA >60 >60 >60  ANIONGAP 11 10 9      Hematology Recent Labs  Lab 07/05/19 0334 07/06/19 0536 07/07/19 0350  WBC 11.5* 10.6* 10.0  RBC 4.47 4.71 4.52  HGB 11.6* 12.1* 11.6*  HCT 37.9* 38.7* 37.8*  MCV 84.8 82.2 83.6  MCH 26.0 25.7* 25.7*  MCHC 30.6 31.3 30.7  RDW 17.7* 18.1* 17.7*  PLT 250 228 228    Radiology    No results found.  Cardiac Studies   RIGHT HEART CATH AND CORONARY ANGIOGRAPHY 06/29/19  Conclusion  Conclusions: 1. Multivessel coronary artery disease, including 50% mid LAD, 80%  large OM2, 50% mid RCA, and 80-90% ostial rPDA stenoses. 2. Mildly to moderately elevated left and right heart filling pressures. 3. Mildly reduced Fick cardiac output/index.  Recommendations: 1. Gentle post-cath hydration for goal net even fluid balance today. Consider gentle diuresis, as renal function tolerates, beginning tomorrow. 2. Ongoing workup for redo aortic valve replacement +/- CABG per Drs. Claiborne Billings and [place]. 3. Initiate heparin infusion 2 hours after TR band removal. 4. Aggressive secondary prevention.   Echo 05/29/19 1. The left ventricle has moderate-severely reduced systolic function, with an ejection fraction of 30-35%. Left ventricular diffuse hypokinesis. 2. The right ventricle has severely reduced systolic function. The cavity was mildly enlarged. 3. Left atrial size was severely dilated. 4. Right atrial size was severely dilated. 5. The mitral valve is grossly normal. No evidence of mitral valve stenosis. 6. The tricuspid valve was grossly normal. 7. Aortic valve regurgitation is severe by color flow Doppler. 8. There is evidence of mild plaque in the descending aorta. 9. Moderate to severe global reduction in LV systolic function; mild RVE with severe RV dysfunction; severe biatrial enlargement; s/p mechanical AVR with mean gradient of 14 mmHg (both leaflets appear to be mobile); severe perivalvular AI; mild MR and  TR.   Patient Profile     Joseph Hoffman a 68 y.o.malewith a PMH of permanent atrial fibrillation, chronic combined CHF,St. JudeAVR in 2013, HTN, HLD, OSA on CPAP, DM type 2, CKD stage 3who has recently was found to have a decline in LV fxn with significant AVR perivalvular leakand recent nonhealing foot ulcer with development of gangrenous plantar aspect of left great toe.   Assessment & Plan    1. Acute on chronic Biventricular heart failure - ECHO 05/2019 EF 30-35% Severe AI, severe RV dysfunction - Last seen by heart failure  team on Thursday with goal weight of 249-251 lbs and recommended starting Entresto 24/26 mg BID once Scr less than 1.8. May dc hydr/niro. -Net -6.7L since admit. Weight 254lb today (relatively stable for past few days). Scr minimally up today at 1.33 from 1.21. - Reviewed with CHF team this morning. Will start Torsemide 40mg  AM and 20mg  PM and Entresto 24/26 BID as recommended.  Goal of Scr less than 1.8.  weight not at goal (249-251lb) but symptoms improving.  - Continue Toprol XL 25mg  qd and digoxin 0.125mg  daily  - Stop Imdur and hydralazine - Follow up with heart failure team as outpatient (will arrange follow up) - Watch renal function and electrolyes  2. CAD - No chest pain. Cath with multivessel CAD on 7/27 - Possible CABG at time of re-do AVR  - Continue current medical therapy  3. HLD - 05/13/2019: Cholesterol 111; HDL 19; LDL Cholesterol 74; Triglycerides 89; VLDL 18  - intolerant of statins. On Vascepa & zetia  4. Severe Aortic insuffiency in setting of mechanical AVR - Pending re-do AVR +/- CABG once resolution of infection - Per [name] >> plan as outpatient - Warfarin restarted yesterday. INR of 1.1 today. On heparin for bridge.   5. Persistent atrial fibrillation - initially bradycardia. Rate improved after reduced BB dose - continue BB and digoxin - On heparin  6. CKD IV - minimally up today - AS above  7. PVD For questions or updates, please contact [place] Please consult [email or web address] for contact info under        Signed, Leanor Kail, PA  07/07/2019, 8:09 AM    I have examined the patient and reviewed assessment and plan and discussed with patient.  Agree with above as stated.    Coumadin restarted.  S/p AVR, crisp S2 click.  Needs CABG /AVR once foot ulcer resolved.    Diuretics arnaged with the heart failure team, with attention paid to serum Cr.   Larae Grooms

## 2019-07-07 NOTE — Progress Notes (Signed)
PROGRESS NOTE  Joseph Hoffman PPI:[id number] DOB: Dec 20, 1950 DOA: 06/26/2019 PCP: Orpah Melter, MD  HPI/Recap of past 24 hours: Joseph Hoffman  is a 68 y.o. male, with medical history significant ofhistory significant ofhypertension, diabetes mellitus, asthma,sCHFwith EF 45%, CAD, atrial fibrillation on Coumadin,AS, s/p ofAVR with mechanical valveon coumadin, OSA on CPAP, CKD stage III, depression with anxiety, who presents with shortness of breath, and chest pain, as well he reports worsening left great toe ulcer with surrounding cellulitis  Patient reports he started to complain of chest pressure over last 48 hours, accompanied by dyspnea, mainly upon exertion, he did take sublingual nitro, with improvement of his chest pain, but today reported did not have any significant event on his chest pain, patient with known mechanical aortic valve insufficiency, plan to have urgent cath by Dr. Claiborne Billings next week, for anticipated aortic valve replacement by CT surgery next month.  Patient was noted to have left lower extremity diabetic great toe ulcer, report is been going on for last 2 weeks, with some drainage, as well with surrounding cellulitis, he denies any fever, chills, he did not seek any medical care for it.  In the ED patient was hypertensive, mildly tachypneic, retinae elevated at 2.1 from baseline 1.8, mild leukocytosis at 14 point 9K, high-sensitivity troponins were elevated at 96>99, INR supratherapeutic at 3.9, COVID-19 is negative, EKG showing A. fib, patient was seen by cardiology for chest pain, and hospitalist were requested to admit for multiple medical problems, with a presentation of infected diabetic ulcer, gangrenous left toe with cellulitis.    07/07/19: Patient was seen and examined at his bedside this morning.  No complaints. Patient denies headache, no fever/chills/night sweats, no chest pain, no palpitations, no shortness of breath, no abdominal pain, no weakness.   Nursing reports mild hypoglycemia this morning with blood sugar of 60, and was patient was given a glass of orange juice.  Patient has been restarted on Coumadin as there is no further surgical plans this hospitalization.  Heart failure team has transitioned his IV furosemide to torsemide and started Entresto today.  No other acute events overnight per nursing staff.  Assessment/Plan: Active Problems:   OSA on CPAP   Chronic anticoagulation   AS (aortic stenosis)   Essential hypertension   Acute combined systolic and diastolic heart failure (HCC)   CKD (chronic kidney disease), stage III (HCC)   Acute on chronic systolic (congestive) heart failure (HCC)   Depression   Foot ulcer, left (HCC)   Aortic valve regurgitation   SOB (shortness of breath)   Aortic valve disease  Sepsis secondary to L great toe gangrenous diabetic ulcer with surrounding cellulitis, POA Presented with leukocytosis with WBC 14 K, tachypnea with respiratory rate 31, Elevated CRP 9.5, sed rate 25.  Patient was started on broad-spectrum IV antibiotics with vancomycin and Rocephin.  Vascular surgery was consulted and patient underwent ray amputation of left great toe by vascular surgery, Dr. Carlis Abbott on 07/03/2019.  Blood cultures x2 showed no growth x5 days.  Infectious disease was consulted and antibiotics were de-escalated to Keflex. --Continue Keflex 500 mg p.o. 3 times daily, with projected end date 07/09/2019.  AKI on CKD2 Presented with creatinine of 2.19 with GFR of 29. Back to his baseline creatinine 1.1 with GFR greater than 60 on 07/04/2019.  Etiology likely secondary to volume overload versus ATN from underlying sepsis as above. --net positive 866mL past 24h with 4 unmeasured urinary occurances and negative 6.6L since admission --Cr down to 1.33 --  Continue to avoid nephrotoxins --Continue to monitor urine output and volume status --Continue to monitor renal function daily  Uncontrolled diabetes, type II with  hyperglycemia Hemoglobin A1c 8.7 on 05/13/2019.  Diabetic educator following --decrease Lantus to 32 units twice daily --Continue NovoLog 20 units before meals --Continue close monitoring of CBGs.  Acute on chronic combined diastolic and systolic CHF Presented with BNP greater than 600.  Chest x-ray with cardiomegaly and prominent bronchovascular markings; no consolidation. --Cardiology following, appreciate assistance --s/p right/left heart catheterization 06/29/2019 --Continues on IV diuresis with furosemide per cardiology --net positive 337mL past 24h and negative 7.5L since admission --Weight 254lbs this morning --Cardiology starting torsemide 40 mg qAm and 20mg  qPM, Entresto 24/26 BID today --Discontinued Imdur and hydralazine --Continue to monitor strict I's and O's and daily weights --Attempt to get weight back to 249-251 pounds per cardiology prior to discharge.   --Planned outpatient follow-up with CHF service following discharge  Permanent A. fib with intermittent slow ventricular response Toprol xl and digoxin doses reduced by cardiology; 25mg  p.o. BID and 0.125mg  PO daily respectively --Continue to monitor on telemetry  Severe aortic insufficiency/RV dysfunction Status post aortic valve replacement and awaiting redo aortic valve replacement which has been postponed --Possible CABG at time of redo AVR, postponed due to to infection. --Will need follow-up outpatient with CTS [name] for reschedule of CABG and redo AVR.  Subtherapeutic INR --Restarted Coumadin 8/3 with heparin bridge; goal INR 2.5-3.5 for mechanical AV valve  Acute on chronic hypoxic respiratory failure likely secondary to acute on chronic combined CHF Etiology likely secondary to volume overload.  Now weaned off supplemental oxygen. --Continue to monitor vital signs closely with SPO2  Severe aortic regurgitation post mechanical AVR On Coumadin outpatient with INR goal 2.5-3.5 --Continue heparin drip  for bridge now restarted Coumadin  Chest pain, resolved   OSA, compliant Continue CPAP at night  Hyperlipidemia Intolerant to statin On fenofibrate, icosapent, and zetia     Code Status: Full code  Family Communication: None  Disposition Plan: Discharge to home possibly in 2-3 days, pending cardiology signed off, cardiothoracic surgery plans to follow-up outpatient for consideration of timing of CABG/redo AV valve.  Will need INR to be therapeutic prior to discharge.  Consultants:  Cardiology  Vascular surgery  Infectious disease  Procedures:  Heart cath on 06/29/2019  Left great toe amputation 07/03/19 by vascular surgery Dr. Carlis Abbott  Antimicrobials:  IV vancomycin, Rocephin  IV Flagyl  Keflex 8/1 - 8/6  DVT prophylaxis: Heparin drip.  Coumadin  Objective: Vitals:   07/06/19 2045 07/07/19 0638 07/07/19 0815 07/07/19 0952  BP:  (!) 124/45  135/71  Pulse:  76 75 68  Resp:  16 17   Temp:  98.2 F (36.8 C)    TempSrc:  Oral    SpO2: 98% 97% 98% 94%  Weight:  115.3 kg    Height:        Intake/Output Summary (Last 24 hours) at 07/07/2019 1139 Last data filed at 07/07/2019 1122 Gross per 24 hour  Intake 1844.66 ml  Output 1825 ml  Net 19.66 ml   Filed Weights   07/05/19 0500 07/06/19 0609 07/07/19 5638  Weight: 115.6 kg 116.5 kg 115.3 kg    Exam:  . General: 68 y.o. year-old male obese in no acute distress.  Alert and oriented x3.   . Cardiovascular: Irregular rate and rhythm with no rubs or gallops.  Mechanical click appreciated, no JVD or thyromegaly noted.  1+ pitting edema bilateral lower  extremities . Respiratory: Clear to auscultation no wheezes or rales.   . Abdomen: Obese nontender nondistended hypoactive bowel sounds present. . Musculoskeletal: Trace lower extremity edema bilaterally.  Left foot in surgical dressing.   [name] Psychiatry: Mood is appropriate for condition and setting.   Data Reviewed: CBC: Recent Labs  Lab 07/03/19 0536  07/04/19 0518 07/05/19 0334 07/06/19 0536 07/07/19 0350  WBC 10.4 11.0* 11.5* 10.6* 10.0  HGB 11.8* 11.9* 11.6* 12.1* 11.6*  HCT 37.0* 39.1 37.9* 38.7* 37.8*  MCV 83.1 84.3 84.8 82.2 83.6  PLT 265 261 250 228 478   Basic Metabolic Panel: Recent Labs  Lab 07/02/19 0511  07/03/19 0536 07/04/19 0518 07/05/19 0334 07/06/19 0536 07/07/19 0350  NA 136   < > 134* 137 136 137 139  K 4.2   < > 4.0 4.1 3.7 4.0 4.0  CL 105   < > 103 104 101 102 100  CO2 22   < > 22 26 24 25 30   GLUCOSE 156*   < > 313* 201* 109* 108* 68*  BUN 44*   < > 42* 37* 36* 33* 35*  CREATININE 1.36*   < > 1.40* 1.18 1.18 1.21 1.33*  CALCIUM 8.7*   < > 9.0 9.0 8.9 9.0 9.2  MG 2.5*  --   --   --   --   --   --    < > = values in this interval not displayed.   GFR: Estimated Creatinine Clearance: 66.7 mL/min (A) (by C-G formula based on SCr of 1.33 mg/dL (H)). Liver Function Tests: No results for input(s): AST, ALT, ALKPHOS, BILITOT, PROT, ALBUMIN in the last 168 hours. No results for input(s): LIPASE, AMYLASE in the last 168 hours. No results for input(s): AMMONIA in the last 168 hours. Coagulation Profile: Recent Labs  Lab 07/01/19 0515 07/02/19 0511 07/03/19 0536 07/07/19 0350  INR 1.4* 1.4* 1.2 1.1   Cardiac Enzymes: No results for input(s): CKTOTAL, CKMB, CKMBINDEX, TROPONINI in the last 168 hours. BNP (last 3 results) No results for input(s): PROBNP in the last 8760 hours. HbA1C: No results for input(s): HGBA1C in the last 72 hours. CBG: Recent Labs  Lab 07/06/19 1218 07/06/19 1638 07/06/19 2225 07/07/19 0618 07/07/19 0640  GLUCAP 232* 193* 198* 60* 82   Lipid Profile: No results for input(s): CHOL, HDL, LDLCALC, TRIG, CHOLHDL, LDLDIRECT in the last 72 hours. Thyroid Function Tests: No results for input(s): TSH, T4TOTAL, FREET4, T3FREE, THYROIDAB in the last 72 hours. Anemia Panel: No results for input(s): VITAMINB12, FOLATE, FERRITIN, TIBC, IRON, RETICCTPCT in the last 72 hours.  Urine analysis:    Component Value Date/Time   COLORURINE YELLOW 09/25/2012 1506   APPEARANCEUR CLEAR 09/25/2012 1506   LABSPEC 1.045 (H) 09/25/2012 1506   PHURINE 6.0 09/25/2012 1506   GLUCOSEU >1000 (A) 09/25/2012 1506   HGBUR NEGATIVE 09/25/2012 1506   BILIRUBINUR NEGATIVE 09/25/2012 1506   KETONESUR NEGATIVE 09/25/2012 1506   PROTEINUR NEGATIVE 09/25/2012 1506   UROBILINOGEN 0.2 09/25/2012 1506   NITRITE NEGATIVE 09/25/2012 1506   LEUKOCYTESUR NEGATIVE 09/25/2012 1506   Sepsis Labs: @LABRCNTIP (procalcitonin:4,lacticidven:4)  ) Recent Results (from the past 240 hour(s))  Surgical pcr screen     Status: None   Collection Time: 07/01/19 11:23 PM   Specimen: Nasal Mucosa; Nasal Swab  Result Value Ref Range Status   MRSA, PCR NEGATIVE NEGATIVE Final   Staphylococcus aureus NEGATIVE NEGATIVE Final    Comment: (NOTE) The Xpert SA Assay (FDA approved for NASAL specimens  in patients 66 years of age and older), is one component of a comprehensive surveillance program. It is not intended to diagnose infection nor to guide or monitor treatment. Performed at [facility], [place] [address], [place], [place] [address]   Aerobic/Anaerobic Culture (surgical/deep wound)     Status: None (Preliminary result)   Collection Time: 07/03/19  8:02 AM   Specimen: Bone; Tissue  Result Value Ref Range Status   Specimen Description BONE LEFT METATARSAL  Final   Special Requests PATIENT ON FOLLOWING ANCEF  Final   Gram Stain   Final    RARE WBC PRESENT, PREDOMINANTLY PMN NO ORGANISMS SEEN    Culture   Final    NO GROWTH 3 DAYS NO ANAEROBES ISOLATED; CULTURE IN PROGRESS FOR 5 DAYS Performed at [facility], [place] [address] [place], [place] [address]    Report Status PENDING  Incomplete      Studies: No results found.  Scheduled Meds: . cephALEXin  500 mg Oral TID AC & HS  . cholecalciferol  1,000 Units Oral Daily  . digoxin  0.125 mg Oral QHS  . escitalopram  10 mg Oral  QPM  . ezetimibe  10 mg Oral Daily  . fenofibrate  160 mg Oral Daily  . Icosapent Ethyl  2 g Oral BID  . insulin aspart  0-20 Units Subcutaneous TID WC  . insulin aspart  0-5 Units Subcutaneous QHS  . insulin aspart  20 Units Subcutaneous TID WC  . insulin glargine  32 Units Subcutaneous BID  . metoprolol succinate  25 mg Oral BID  . mometasone-formoterol  2 puff Inhalation BID  . multivitamin  1 tablet Oral Daily  . nutrition supplement (JUVEN)  1 packet Oral BID BM  . Ensure Max Protein  11 oz Oral Daily  . saccharomyces boulardii  250 mg Oral Q M,W,F  . sacubitril-valsartan  1 tablet Oral BID  . senna-docusate  2 tablet Oral BID  . sodium chloride flush  3 mL Intravenous Q12H  . sodium chloride flush  3 mL Intravenous Q12H  . torsemide  20 mg Oral QPM  . torsemide  40 mg Oral q morning - 10a  . vitamin C  1,000 mg Oral QPM  . warfarin  12.5 mg Oral ONCE-1800  . Warfarin - Pharmacist Dosing Inpatient   Does not apply q1800    Continuous Infusions: . sodium chloride 50 mL/hr at 06/30/19 0635  . sodium chloride 250 mL (07/01/19 0634)  . sodium chloride    . heparin 1,700 Units/hr (07/07/19 0127)     LOS: 11 days     Bailynn Dyk J [place] DO [facility] Pager [phone]  If 7PM-7AM, please contact night-coverage [email or web address] Password [facility] 07/07/2019, 11:39 AM

## 2019-07-08 LAB — GLUCOSE, CAPILLARY
Glucose-Capillary: 132 mg/dL — ABNORMAL HIGH (ref 70–99)
Glucose-Capillary: 151 mg/dL — ABNORMAL HIGH (ref 70–99)
Glucose-Capillary: 70 mg/dL (ref 70–99)
Glucose-Capillary: 233 mg/dL — ABNORMAL HIGH (ref 70–99)

## 2019-07-08 LAB — CBC
HCT: 39.5 % (ref 39.0–52.0)
Hemoglobin: 12.3 g/dL — ABNORMAL LOW (ref 13.0–17.0)
MCH: 26 pg (ref 26.0–34.0)
MCHC: 31.1 g/dL (ref 30.0–36.0)
MCV: 83.5 fL (ref 80.0–100.0)
Platelets: 242 10*3/uL (ref 150–400)
RBC: 4.73 MIL/uL (ref 4.22–5.81)
RDW: 18.1 % — ABNORMAL HIGH (ref 11.5–15.5)
WBC: 9.6 10*3/uL (ref 4.0–10.5)
nRBC: 0 % (ref 0.0–0.2)

## 2019-07-08 LAB — HEPARIN LEVEL (UNFRACTIONATED): Heparin Unfractionated: 0.43 IU/mL (ref 0.30–0.70)

## 2019-07-08 LAB — BASIC METABOLIC PANEL
Anion gap: 12 (ref 5–15)
BUN: 34 mg/dL — ABNORMAL HIGH (ref 8–23)
CO2: 24 mmol/L (ref 22–32)
Calcium: 9.1 mg/dL (ref 8.9–10.3)
Chloride: 100 mmol/L (ref 98–111)
Creatinine, Ser: 1.29 mg/dL — ABNORMAL HIGH (ref 0.61–1.24)
GFR calc Af Amer: >60 mL/min (ref >60)
GFR calc non Af Amer: 56 mL/min — ABNORMAL LOW (ref >60)
Glucose, Bld: 162 mg/dL — ABNORMAL HIGH (ref 70–99)
Potassium: 4.1 mmol/L (ref 3.5–5.1)
Sodium: 136 mmol/L (ref 135–145)

## 2019-07-08 LAB — PROTIME-INR
INR: 1.4 — ABNORMAL HIGH (ref 0.8–1.2)
Prothrombin Time: 16.6 seconds — ABNORMAL HIGH (ref 11.4–15.2)

## 2019-07-08 LAB — AEROBIC/ANAEROBIC CULTURE W GRAM STAIN (SURGICAL/DEEP WOUND): Culture: NO GROWTH

## 2019-07-08 MED ORDER — WARFARIN SODIUM 2.5 MG PO TABS
12.5000 mg | ORAL_TABLET | Freq: Once | ORAL | Status: AC
  Administered 2019-07-08: 12.5 mg via ORAL
  Filled 2019-07-08: qty 1

## 2019-07-08 MED ORDER — OXYCODONE HCL 5 MG PO TABS
5.0000 mg | ORAL_TABLET | ORAL | Status: DC | PRN
  Administered 2019-07-08 – 2019-07-10 (×7): 5 mg via ORAL
  Filled 2019-07-08 (×7): qty 1

## 2019-07-08 NOTE — Plan of Care (Signed)

## 2019-07-08 NOTE — Plan of Care (Signed)
  Problem: Education: Goal: Ability to demonstrate management of disease process will improve Outcome: Progressing   Problem: Activity: Goal: Capacity to carry out activities will improve Outcome: Progressing   Problem: Cardiac: Goal: Ability to achieve and maintain adequate cardiopulmonary perfusion will improve Outcome: Progressing   Problem: Education: Goal: Knowledge of General Education information will improve Description: Including pain rating scale, medication(s)/side effects and non-pharmacologic comfort measures Outcome: Progressing   Problem: Health Behavior/Discharge Planning: Goal: Ability to manage health-related needs will improve Outcome: Progressing   Problem: Clinical Measurements: Goal: Respiratory complications will improve Outcome: Progressing Goal: Cardiovascular complication will be avoided Outcome: Progressing   Problem: Activity: Goal: Risk for activity intolerance will decrease Outcome: Progressing   Problem: Coping: Goal: Level of anxiety will decrease Outcome: Progressing   Problem: Elimination: Goal: Will not experience complications related to bowel motility Outcome: Progressing Goal: Will not experience complications related to urinary retention Outcome: Progressing   Problem: Pain Managment: Goal: General experience of comfort will improve Outcome: Progressing

## 2019-07-08 NOTE — Progress Notes (Signed)
CCMD called to report pt HR dropped into 30's, pt was sitting on side of bed, only c/o is throbbing in his left foot, noted afib 60's on telemetry box, pt denies cp or sob, paged MD to advise of pt c/o pain but did not want the dilaudid as it made him so sleepy and felt funny, new parameters for oxy and diluadid dc. Reviewed telemetry and pt afib with slow vent but has not changed since yesterday, cards not reviews and also reports pt has AFIB with SVR, no pauses noted today

## 2019-07-08 NOTE — Progress Notes (Signed)
ANTICOAGULATION CONSULT NOTE - Follow Up Consult  Pharmacy Consult for Heparin and Warfarin Indication: mechanical AVR, afib  Allergies  Allergen Reactions  . Iohexol Anaphylaxis  . Niacin And Related     Flushing with immediate realese  . Penicillins Other (See Comments)    Unknown.[name]  Did it involve swelling of the face/tongue/throat, SOB, or low BP? Unknown Did it involve sudden or severe rash/hives, skin peeling, or any reaction on the inside of your mouth or nose? Unknown Did you need to seek medical attention at a hospital or doctor's office? Unknown When did it last happen?Childhood If all above answers are "NO", may proceed with cephalosporin use.    Patient Measurements: Height: 5\' 11"  (180.3 cm) Weight: 253 lb 4.8 oz (114.9 kg)(scale a) IBW/kg (Calculated) : 75.3 Heparin Dosing Weight: 100 kg  Vital Signs: Temp: 98.5 F (36.9 C) (08/05 0420) Temp Source: Oral (08/05 0420) BP: 123/71 (08/05 0420) Pulse Rate: 88 (08/05 0420)  Labs: Recent Labs    07/06/19 0536 07/07/19 0350 07/08/19 0745  HGB 12.1* 11.6* 12.3*  HCT 38.7* 37.8* 39.5  PLT 228 228 242  LABPROT  --  14.2 16.6*  INR  --  1.1 1.4*  HEPARINUNFRC 0.48 0.52 0.43  CREATININE 1.21 1.33* 1.29*    Estimated Creatinine Clearance: 68.7 mL/min (A) (by C-G formula based on SCr of 1.29 mg/dL (H)).   Medications:  Scheduled:  . cephALEXin  500 mg Oral TID AC & HS  . cholecalciferol  1,000 Units Oral Daily  . digoxin  0.125 mg Oral QHS  . escitalopram  10 mg Oral QPM  . ezetimibe  10 mg Oral Daily  . feeding supplement (GLUCERNA SHAKE)  237 mL Oral BID BM  . fenofibrate  160 mg Oral Daily  . Icosapent Ethyl  2 g Oral BID  . insulin aspart  0-20 Units Subcutaneous TID WC  . insulin aspart  0-5 Units Subcutaneous QHS  . insulin aspart  20 Units Subcutaneous TID WC  . insulin glargine  32 Units Subcutaneous BID  . metoprolol succinate  25 mg Oral BID  .  mometasone-formoterol  2 puff Inhalation BID  . multivitamin  1 tablet Oral Daily  . Ensure Max Protein  11 oz Oral Daily  . saccharomyces boulardii  250 mg Oral Q M,W,F  . sacubitril-valsartan  1 tablet Oral BID  . senna-docusate  2 tablet Oral BID  . sodium chloride flush  3 mL Intravenous Q12H  . sodium chloride flush  3 mL Intravenous Q12H  . torsemide  20 mg Oral QPM  . torsemide  40 mg Oral q morning - 10a  . vitamin C  1,000 mg Oral QPM  . Warfarin - Pharmacist Dosing Inpatient   Does not apply q1800   Infusions:  . sodium chloride 50 mL/hr at 06/30/19 0635  . sodium chloride 250 mL (07/01/19 0634)  . sodium chloride    . heparin 1,700 Units/hr (07/07/19 1816)    Assessment: 68 yo M on warfarin PTA for mechanical AVR + afib.  Warfarin was held and bridge initiated with heparin for cardiac cath and toe amputation.  Pt also needs redo AVR +/- CABG however this has been delayed due to recent toe infection and amputation.  Pt remains therapeutic on heparin at 1700 units/hr.  Warfarin restarted 8/3.  Will need parenteral overlap until INR therapeutic given mechanical valve.  INR rising nicely after 2 doses of warfarin.  No bleeding noted.  Warfarin dose  PTA = 10mg  daily except 12.5mg  TTS  Goal of Therapy:  Heparin level 0.3-0.7 units/ml INR 2.5-3.5 Monitor platelets by anticoagulation protocol: Yes   Plan:  -Continue heparin drip at 1700 units/hr -Warfarin 12.5mg  PO x 1 tonight -Check daily INR, CBC, & heparin level -Monitor for s/sx of bleeding  [employer], Pharm.D., BCPS Clinical Pharmacist Pager: [phone] Clinical phone for 07/08/2019 from 8:30-4:00 is [phone].  **Pharmacist phone directory can now be found on [email or web address] ([email or web address]).  Listed under [place].  07/08/2019 8:56 AM

## 2019-07-08 NOTE — Progress Notes (Signed)
[facility]  Subjective  - No complaints   Objective 123/71 88 98.5 F (36.9 C) (Oral) 20 95%  Intake/Output Summary (Last 24 hours) at 07/08/2019 0848 Last data filed at 07/08/2019 0800 Gross per 24 hour  Intake 2855.53 ml  Output 2975 ml  Net -119.47 ml    Left great toe amp c/d/i - skin edges viable  Laboratory Lab Results: Recent Labs    07/07/19 0350 07/08/19 0745  WBC 10.0 9.6  HGB 11.6* 12.3*  HCT 37.8* 39.5  PLT 228 242   BMET Recent Labs    07/07/19 0350 07/08/19 0745  NA 139 136  K 4.0 4.1  CL 100 100  CO2 30 24  GLUCOSE 68* 162*  BUN 35* 34*  CREATININE 1.33* 1.29*  CALCIUM 9.2 9.1    COAG Lab Results  Component Value Date   INR 1.4 (H) 07/08/2019   INR 1.1 07/07/2019   INR 1.2 07/03/2019   No results found for: PTT  Assessment/Planning:  68 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  Severe tibial disease, peroneal runoff.  Left great toe amp looks good, skins margins viable, dressing changed again this am.  Can be discharged from our standpoint and will arrange 3 week follow-up for suture removal/wound check.  Marty Heck 07/08/2019 8:48 AM --

## 2019-07-08 NOTE — Progress Notes (Signed)
No additional bleeding throughout the night.  Patient has had pain around surgical site rating 7/10 and 9/10.  Has been controlled with PO oxycodone 5mg .

## 2019-07-08 NOTE — Progress Notes (Addendum)
Progress Note  Patient Name: Joseph Hoffman Date of Encounter: 07/08/2019  Primary Cardiologist: Shelva Majestic, MD   Subjective   Sleepy, but received pain meds for foot this morning. No complaints.   Inpatient Medications    Scheduled Meds: . cephALEXin  500 mg Oral TID AC & HS  . cholecalciferol  1,000 Units Oral Daily  . digoxin  0.125 mg Oral QHS  . escitalopram  10 mg Oral QPM  . ezetimibe  10 mg Oral Daily  . feeding supplement (GLUCERNA SHAKE)  237 mL Oral BID BM  . fenofibrate  160 mg Oral Daily  . Icosapent Ethyl  2 g Oral BID  . insulin aspart  0-20 Units Subcutaneous TID WC  . insulin aspart  0-5 Units Subcutaneous QHS  . insulin aspart  20 Units Subcutaneous TID WC  . insulin glargine  32 Units Subcutaneous BID  . metoprolol succinate  25 mg Oral BID  . mometasone-formoterol  2 puff Inhalation BID  . multivitamin  1 tablet Oral Daily  . Ensure Max Protein  11 oz Oral Daily  . saccharomyces boulardii  250 mg Oral Q M,W,F  . sacubitril-valsartan  1 tablet Oral BID  . senna-docusate  2 tablet Oral BID  . sodium chloride flush  3 mL Intravenous Q12H  . sodium chloride flush  3 mL Intravenous Q12H  . torsemide  20 mg Oral QPM  . torsemide  40 mg Oral q morning - 10a  . vitamin C  1,000 mg Oral QPM  . warfarin  12.5 mg Oral ONCE-1800  . Warfarin - Pharmacist Dosing Inpatient   Does not apply q1800   Continuous Infusions: . sodium chloride 50 mL/hr at 06/30/19 0635  . sodium chloride 250 mL (07/01/19 0634)  . sodium chloride    . heparin 1,700 Units/hr (07/07/19 1816)   PRN Meds: sodium chloride, sodium chloride, acetaminophen, albuterol, guaiFENesin-dextromethorphan, hydrALAZINE, HYDROmorphone (DILAUDID) injection, labetalol, loperamide, nitroGLYCERIN, ondansetron (ZOFRAN) IV, oxyCODONE, sodium chloride flush, sodium chloride flush, sodium chloride flush   Vital Signs    Vitals:   07/07/19 1921 07/07/19 2146 07/08/19 0420 07/08/19 0736  BP: 137/67 (!)  117/59 123/71   Pulse: 78 81 88   Resp: 18  20   Temp: 98.8 F (37.1 C)  98.5 F (36.9 C)   TempSrc: Oral  Oral   SpO2: 96%  98% 95%  Weight:   114.9 kg   Height:        Intake/Output Summary (Last 24 hours) at 07/08/2019 5409 Last data filed at 07/08/2019 0800 Gross per 24 hour  Intake 2855.53 ml  Output 2975 ml  Net -119.47 ml   Last 3 Weights 07/08/2019 07/07/2019 07/06/2019  Weight (lbs) 253 lb 4.8 oz 254 lb 3.2 oz 256 lb 13.4 oz  Weight (kg) 114.896 kg 115.304 kg 116.5 kg      Telemetry    Afib rate controlled - Personally Reviewed  ECG    N/a - Personally Reviewed  Physical Exam  Pleasant older WM GEN: No acute distress.   Neck: No JVD Cardiac: Irreg Irreg, + s2 click Respiratory: Clear to auscultation bilaterally. GI: Soft, nontender, non-distended  MS: No edema,  Left foot bandage dry Neuro:  Nonfocal  Psych: Normal affect   Labs    High Sensitivity Troponin:   Recent Labs  Lab 06/26/19 1315 06/26/19 1446  TROPONINIHS 96* 99*      Cardiac EnzymesNo results for input(s): TROPONINI in the last 168 hours. No results for  input(s): TROPIPOC in the last 168 hours.   Chemistry Recent Labs  Lab 07/06/19 0536 07/07/19 0350 07/08/19 0745  NA 137 139 136  K 4.0 4.0 4.1  CL 102 100 100  CO2 25 30 24   GLUCOSE 108* 68* 162*  BUN 33* 35* 34*  CREATININE 1.21 1.33* 1.29*  CALCIUM 9.0 9.2 9.1  GFRNONAA >60 54* 56*  GFRAA >60 >60 >60  ANIONGAP 10 9 12      Hematology Recent Labs  Lab 07/06/19 0536 07/07/19 0350 07/08/19 0745  WBC 10.6* 10.0 9.6  RBC 4.71 4.52 4.73  HGB 12.1* 11.6* 12.3*  HCT 38.7* 37.8* 39.5  MCV 82.2 83.6 83.5  MCH 25.7* 25.7* 26.0  MCHC 31.3 30.7 31.1  RDW 18.1* 17.7* 18.1*  PLT 228 228 242    BNPNo results for input(s): BNP, PROBNP in the last 168 hours.   DDimer No results for input(s): DDIMER in the last 168 hours.   Radiology    No results found.  Cardiac Studies   RIGHT HEART CATH AND CORONARY ANGIOGRAPHY7/27/20   Conclusion  Conclusions: 1. Multivessel coronary artery disease, including 50% mid LAD, 80% large OM2, 50% mid RCA, and 80-90% ostial rPDA stenoses. 2. Mildly to moderately elevated left and right heart filling pressures. 3. Mildly reduced Fick cardiac output/index.  Recommendations: 1. Gentle post-cath hydration for goal net even fluid balance today. Consider gentle diuresis, as renal function tolerates, beginning tomorrow. 2. Ongoing workup for redo aortic valve replacement +/- CABG per Drs. Claiborne Billings and [place]. 3. Initiate heparin infusion 2 hours after TR band removal. 4. Aggressive secondary prevention.   Echo 05/29/19 1. The left ventricle has moderate-severely reduced systolic function, with an ejection fraction of 30-35%. Left ventricular diffuse hypokinesis. 2. The right ventricle has severely reduced systolic function. The cavity was mildly enlarged. 3. Left atrial size was severely dilated. 4. Right atrial size was severely dilated. 5. The mitral valve is grossly normal. No evidence of mitral valve stenosis. 6. The tricuspid valve was grossly normal. 7. Aortic valve regurgitation is severe by color flow Doppler. 8. There is evidence of mild plaque in the descending aorta. 9. Moderate to severe global reduction in LV systolic function; mild RVE with severe RV dysfunction; severe biatrial enlargement; s/p mechanical AVR with mean gradient of 14 mmHg (both leaflets appear to be mobile); severe perivalvular AI; mild MR and  TR.   Patient Profile     68 y.o. male malewith a PMH of permanent atrial fibrillation, chronic combined CHF,St. JudeAVR in 2013, HTN, HLD, OSA on CPAP, DM type 2, CKD stage 3who has recently was found to have a decline in LV fxn with significant AVR perivalvular leakand recent nonhealing foot ulcer with development of gangrenous plantar aspect of left great toe.  Assessment & Plan    1. Acute on chronic Biventricular heart failure: ECHO 05/2019  EF 30-35% Severe AI, severe RV dysfunction -Net -6.8L since admit. Weight 253lb today (relatively stable for past few days). Scr stable today at 1.2 - Started on Torsemide 40mg  AM and 20mg  PM and Entresto 24/26 BID as recommended.  Goal of Scr less than 1.8.  weight not at goal (249-251lb) but symptoms significantly improved.  - Continue Toprol XL 25mg  qd and digoxin 0.125mg  daily  - Follow up with heart failure team as outpatient has been arranged - continue to monitor renal function and electrolyes  2. CAD: No chest pain. Cath with multivessel CAD on 7/27 - Needs CABG with AVR once healed from  foot ulcer/cellulititus  - Continue current medical therapy  3. HLD: -05/13/2019: Cholesterol 111; HDL 19; LDL Cholesterol 74; Triglycerides 89; VLDL 18 -intolerant of statins. On Vascepa & zetia  4.Severe Aortic insuffiency in setting of mechanical AVR: - Pending re-do AVR once infection resolved. - Per [name] >> plan as outpatient. Appt noted.  - Warfarin restarted, INR 1.4 today on heparin bridge  5. Persistent atrial fibrillation: initially bradycardia. BB was reduced, rates still slow today but he has not been up out of bed.  - continue BB and digoxin - On heparin  6. CKD IV: Cr improved to 1.2 today.  7. PVD s/p left great toe amp: with Dr. Carlis Abbott. Cleared from a VVS standpoint.  For questions or updates, please contact [place] Please consult [email or web address] for contact info under        Signed, Reino Bellis, NP  07/08/2019, 9:52 AM    I have examined the patient and reviewed assessment and plan and discussed with patient.  Agree with above as stated.    Able to lie flat wihtout shortness of breath.  Renal function stable.  Await therapeutic INR.  Will need CABG/AVR after foot is stable.  Larae Grooms

## 2019-07-08 NOTE — Progress Notes (Signed)
Patient has quarter sized amount of blood on bandage.  RN marked edges with marker.  Will continue to monitor for additional bleeding.

## 2019-07-08 NOTE — Progress Notes (Signed)
PROGRESS NOTE  Joseph Hoffman JSH:[id number] DOB: 1951-06-14 DOA: 06/26/2019 PCP: Orpah Melter, MD  HPI/Recap of past 24 hours: Joseph Hoffman  is a 68 y.o. male, with medical history significant ofhistory significant ofhypertension, diabetes mellitus, asthma,sCHFwith EF 45%, CAD, atrial fibrillation on Coumadin,AS, s/p ofAVR with mechanical valveon coumadin, OSA on CPAP, CKD stage III, depression with anxiety, who presents with shortness of breath, and chest pain, as well he reports worsening left great toe ulcer with surrounding cellulitis  Patient reports he started to complain of chest pressure over last 48 hours, accompanied by dyspnea, mainly upon exertion, he did take sublingual nitro, with improvement of his chest pain, but today reported did not have any significant event on his chest pain, patient with known mechanical aortic valve insufficiency, plan to have urgent cath by Dr. Claiborne Billings next week, for anticipated aortic valve replacement by CT surgery next month.  Patient was noted to have left lower extremity diabetic great toe ulcer, report is been going on for last 2 weeks, with some drainage, as well with surrounding cellulitis, he denies any fever, chills, he did not seek any medical care for it.  In the ED patient was hypertensive, mildly tachypneic, retinae elevated at 2.1 from baseline 1.8, mild leukocytosis at 14 point 9K, high-sensitivity troponins were elevated at 96>99, INR supratherapeutic at 3.9, COVID-19 is negative, EKG showing A. fib, patient was seen by cardiology for chest pain, and hospitalist were requested to admit for multiple medical problems, with a presentation of infected diabetic ulcer, gangrenous left toe with cellulitis.    07/07/19: Patient was seen and examined at his bedside this morning.  No complaints. Patient denies headache, no fever/chills/night sweats, no chest pain, no palpitations, no shortness of breath, no abdominal pain, no weakness.   Nursing reports mild hypoglycemia this morning with blood sugar of 60, and was patient was given a glass of orange juice.  Patient has been restarted on Coumadin as there is no further surgical plans this hospitalization.  Heart failure team has transitioned his IV furosemide to torsemide and started Entresto today.  No other acute events overnight per nursing staff.  Assessment/Plan: Active Problems:   OSA on CPAP   Chronic anticoagulation   AS (aortic stenosis)   Essential hypertension   Acute combined systolic and diastolic heart failure (HCC)   CKD (chronic kidney disease), stage III (HCC)   Acute on chronic systolic (congestive) heart failure (HCC)   Depression   Foot ulcer, left (HCC)   Aortic valve regurgitation   SOB (shortness of breath)   Aortic valve disease  Peripheral vascular disease Sepsis secondary to L great toe gangrenous diabetic ulcer with surrounding cellulitis, POA Presented with leukocytosis with WBC 14 K, tachypnea with respiratory rate 31, Elevated CRP 9.5, sed rate 25.  Patient was started on broad-spectrum IV antibiotics with vancomycin and Rocephin.  Vascular surgery was consulted and patient underwent ray amputation of left great toe by vascular surgery, Dr. Carlis Abbott on 07/03/2019.  Blood cultures x2 showed no growth x5 days.  Infectious disease was consulted and antibiotics were de-escalated to Keflex. --Continue Keflex 500 mg p.o. 3 times daily, with projected end date 07/09/2019.  AKI on CKD2 Presented with creatinine of 2.19 with GFR of 29. Back to his baseline creatinine 1.1 with GFR greater than 60 on 07/04/2019.  Etiology likely secondary to volume overload versus ATN from underlying sepsis as above. --net positive 863mL past 24h with several unmeasured urinary occurances and negative 7.4L since admission --Cr  down to 1.29 --Continue to avoid nephrotoxins --Continue to monitor urine output and volume status --Continue to monitor renal function daily   Uncontrolled diabetes, type II with hyperglycemia Hemoglobin A1c 8.7 on 05/13/2019.  Diabetic educator following --decreased Lantus to 32 units twice daily on 8/4 --Continue NovoLog 20 units before meals --Continue close monitoring of CBGs.  Acute on chronic hypoxic respiratory failure Acute on chronic combined diastolic and systolic CHF Presented with BNP greater than 600.  Chest x-ray with cardiomegaly and prominent bronchovascular markings; no consolidation.  Oxygen now titrated off. --Cardiology following, appreciate assistance --s/p right/left heart catheterization 06/29/2019 --net positive 877mL past 24h and negative 7.4L since admission --Weight 253lbs this morning --Continue torsemide 40 mg qAm and 20mg  qPM, Entresto 24/26 BID  --Continue to monitor strict I's and O's and daily weights --Attempt to get weight back to 249-251 pounds per cardiology prior to discharge.   --Planned outpatient follow-up with CHF service following discharge, scheduled on 07/17/2019  Permanent A. fib with intermittent slow ventricular response Toprol xl and digoxin doses reduced by cardiology; 25mg  p.o. BID and 0.125mg  PO daily respectively --Continue to monitor on telemetry  CAD No chest pain. Cath with multivessel CAD on 7/27. Status post aortic valve replacement and awaiting redo aortic valve replacement which has been postponed --CABG at time of redo AVR, postponed due to to infection. --Will need follow-up outpatient with CTS [name] for reschedule of CABG and redo AVR.  Severe aortic regurgitation post mechanical AVR --Restarted Coumadin 8/3 with heparin bridge; goal INR 2.5-3.5 for mechanical AV valve --INR 1.1-->1.4 --Continue further management per pharmacy  HLD Cholesterol 111; HDL 19; LDL Cholesterol 74; Triglycerides 89; VLDL 18. Intolerant of statins.  --continue fenofibrate, icosapent, and zetia  Chest pain, resolved   OSA, compliant Continue CPAP at night   Code Status: Full  code  Family Communication: None  Disposition Plan: Discharge to home possibly in 2-3 days, pending cardiology sign off, cardiothoracic surgery plans to follow-up outpatient for consideration of timing of CABG/redo AV valve.  Will need INR to be therapeutic prior to discharge.  Consultants:  Cardiology  Vascular surgery  Infectious disease  Procedures:  Heart cath on 06/29/2019  Left great toe amputation 07/03/19 by vascular surgery Dr. Carlis Abbott  Antimicrobials:  IV vancomycin, Rocephin  IV Flagyl  Keflex 8/1 - 8/6  DVT prophylaxis: Heparin drip.  Coumadin  Objective: Vitals:   07/07/19 1921 07/07/19 2146 07/08/19 0420 07/08/19 0736  BP: 137/67 (!) 117/59 123/71   Pulse: 78 81 88   Resp: 18  20   Temp: 98.8 F (37.1 C)  98.5 F (36.9 C)   TempSrc: Oral  Oral   SpO2: 96%  98% 95%  Weight:   114.9 kg   Height:        Intake/Output Summary (Last 24 hours) at 07/08/2019 1052 Last data filed at 07/08/2019 0800 Gross per 24 hour  Intake 2499.31 ml  Output 2975 ml  Net -475.69 ml   Filed Weights   07/06/19 0609 07/07/19 0638 07/08/19 0420  Weight: 116.5 kg 115.3 kg 114.9 kg    Exam:  . General: 68 y.o. year-old male obese in no acute distress.  Alert and oriented x3.   . Cardiovascular: Irregular rate and rhythm with no rubs or gallops.  Mechanical click appreciated, no JVD or thyromegaly noted.  1+ pitting edema bilateral lower extremities . Respiratory: Clear to auscultation no wheezes or rales.   . Abdomen: Obese nontender nondistended hypoactive bowel sounds present. [name]  Musculoskeletal: Trace lower extremity edema bilaterally.  Left foot in surgical dressing.   [name] Psychiatry: Mood is appropriate for condition and setting.   Data Reviewed: CBC: Recent Labs  Lab 07/04/19 0518 07/05/19 0334 07/06/19 0536 07/07/19 0350 07/08/19 0745  WBC 11.0* 11.5* 10.6* 10.0 9.6  HGB 11.9* 11.6* 12.1* 11.6* 12.3*  HCT 39.1 37.9* 38.7* 37.8* 39.5  MCV 84.3 84.8 82.2 83.6  83.5  PLT 261 250 228 228 784   Basic Metabolic Panel: Recent Labs  Lab 07/02/19 0511  07/04/19 0518 07/05/19 0334 07/06/19 0536 07/07/19 0350 07/08/19 0745  NA 136   < > 137 136 137 139 136  K 4.2   < > 4.1 3.7 4.0 4.0 4.1  CL 105   < > 104 101 102 100 100  CO2 22   < > 26 24 25 30 24   GLUCOSE 156*   < > 201* 109* 108* 68* 162*  BUN 44*   < > 37* 36* 33* 35* 34*  CREATININE 1.36*   < > 1.18 1.18 1.21 1.33* 1.29*  CALCIUM 8.7*   < > 9.0 8.9 9.0 9.2 9.1  MG 2.5*  --   --   --   --   --   --    < > = values in this interval not displayed.   GFR: Estimated Creatinine Clearance: 68.7 mL/min (A) (by C-G formula based on SCr of 1.29 mg/dL (H)). Liver Function Tests: No results for input(s): AST, ALT, ALKPHOS, BILITOT, PROT, ALBUMIN in the last 168 hours. No results for input(s): LIPASE, AMYLASE in the last 168 hours. No results for input(s): AMMONIA in the last 168 hours. Coagulation Profile: Recent Labs  Lab 07/02/19 0511 07/03/19 0536 07/07/19 0350 07/08/19 0745  INR 1.4* 1.2 1.1 1.4*   Cardiac Enzymes: No results for input(s): CKTOTAL, CKMB, CKMBINDEX, TROPONINI in the last 168 hours. BNP (last 3 results) No results for input(s): PROBNP in the last 8760 hours. HbA1C: No results for input(s): HGBA1C in the last 72 hours. CBG: Recent Labs  Lab 07/07/19 0640 07/07/19 1142 07/07/19 1704 07/07/19 2121 07/08/19 0602  GLUCAP 82 161* 136* 143* 132*   Lipid Profile: No results for input(s): CHOL, HDL, LDLCALC, TRIG, CHOLHDL, LDLDIRECT in the last 72 hours. Thyroid Function Tests: No results for input(s): TSH, T4TOTAL, FREET4, T3FREE, THYROIDAB in the last 72 hours. Anemia Panel: No results for input(s): VITAMINB12, FOLATE, FERRITIN, TIBC, IRON, RETICCTPCT in the last 72 hours. Urine analysis:    Component Value Date/Time   COLORURINE YELLOW 09/25/2012 1506   APPEARANCEUR CLEAR 09/25/2012 1506   LABSPEC 1.045 (H) 09/25/2012 1506   PHURINE 6.0 09/25/2012 1506    GLUCOSEU >1000 (A) 09/25/2012 1506   HGBUR NEGATIVE 09/25/2012 1506   BILIRUBINUR NEGATIVE 09/25/2012 1506   KETONESUR NEGATIVE 09/25/2012 1506   PROTEINUR NEGATIVE 09/25/2012 1506   UROBILINOGEN 0.2 09/25/2012 1506   NITRITE NEGATIVE 09/25/2012 1506   LEUKOCYTESUR NEGATIVE 09/25/2012 1506   Sepsis Labs: @LABRCNTIP (procalcitonin:4,lacticidven:4)  ) Recent Results (from the past 240 hour(s))  Surgical pcr screen     Status: None   Collection Time: 07/01/19 11:23 PM   Specimen: Nasal Mucosa; Nasal Swab  Result Value Ref Range Status   MRSA, PCR NEGATIVE NEGATIVE Final   Staphylococcus aureus NEGATIVE NEGATIVE Final    Comment: (NOTE) The Xpert SA Assay (FDA approved for NASAL specimens in patients 28 years of age and older), is one component of a comprehensive surveillance program. It is not intended to diagnose  infection nor to guide or monitor treatment. Performed at [facility], [place] [address] [place], [place] [address]   Aerobic/Anaerobic Culture (surgical/deep wound)     Status: None (Preliminary result)   Collection Time: 07/03/19  8:02 AM   Specimen: Bone; Tissue  Result Value Ref Range Status   Specimen Description BONE LEFT METATARSAL  Final   Special Requests PATIENT ON FOLLOWING ANCEF  Final   Gram Stain   Final    RARE WBC PRESENT, PREDOMINANTLY PMN NO ORGANISMS SEEN    Culture   Final    NO GROWTH 4 DAYS NO ANAEROBES ISOLATED; CULTURE IN PROGRESS FOR 5 DAYS Performed at [facility], [place] [address], [place], [place] [address]    Report Status PENDING  Incomplete      Studies: No results found.  Scheduled Meds: . cephALEXin  500 mg Oral TID AC & HS  . cholecalciferol  1,000 Units Oral Daily  . digoxin  0.125 mg Oral QHS  . escitalopram  10 mg Oral QPM  . ezetimibe  10 mg Oral Daily  . feeding supplement (GLUCERNA SHAKE)  237 mL Oral BID BM  . fenofibrate  160 mg Oral Daily  . Icosapent Ethyl  2 g Oral BID  . insulin aspart  0-20 Units  Subcutaneous TID WC  . insulin aspart  0-5 Units Subcutaneous QHS  . insulin aspart  20 Units Subcutaneous TID WC  . insulin glargine  32 Units Subcutaneous BID  . metoprolol succinate  25 mg Oral BID  . mometasone-formoterol  2 puff Inhalation BID  . multivitamin  1 tablet Oral Daily  . Ensure Max Protein  11 oz Oral Daily  . saccharomyces boulardii  250 mg Oral Q M,W,F  . sacubitril-valsartan  1 tablet Oral BID  . senna-docusate  2 tablet Oral BID  . sodium chloride flush  3 mL Intravenous Q12H  . sodium chloride flush  3 mL Intravenous Q12H  . torsemide  20 mg Oral QPM  . torsemide  40 mg Oral q morning - 10a  . vitamin C  1,000 mg Oral QPM  . warfarin  12.5 mg Oral ONCE-1800  . Warfarin - Pharmacist Dosing Inpatient   Does not apply q1800    Continuous Infusions: . sodium chloride 50 mL/hr at 06/30/19 0635  . sodium chloride 250 mL (07/01/19 0634)  . sodium chloride    . heparin 1,700 Units/hr (07/07/19 1816)     LOS: 12 days     Loredana Medellin J [place] DO [facility] Pager [phone]  If 7PM-7AM, please contact night-coverage [email or web address] Password [email or web address] 07/08/2019, 10:52 AM

## 2019-07-08 NOTE — TOC Initial Note (Signed)
Transition of Care [facility]) - Initial/Assessment Note    Patient Details  Name: Joseph Hoffman MRN: [id number] Date of Birth: July 02, 1951  [facility] CM/SW Contact:    Marilu Favre, RN Phone Number: 07/08/2019, 8:15 AM  Clinical Narrative:                   Expected Discharge Plan: Home/Self Care Barriers to Discharge: Continued Medical Work up   Patient Goals and CMS Choice Patient states their goals for this hospitalization and ongoing recovery are:: to go home   Choice offered to / list presented to : NA  Expected Discharge Plan and Services Expected Discharge Plan: Home/Self Care   Discharge Planning Services: CM Consult   Living arrangements for the past 2 months: Single Family Home                 DME Arranged: Walker rolling DME Agency: [employer] Date DME Agency Contacted: 07/08/19 Time DME Agency Contacted: [id number]   HH Arranged: NA          Prior Living Arrangements/Services Living arrangements for the past 2 months: Single Family Home Lives with:: Spouse Patient language and need for interpreter reviewed:: Yes Do you feel safe going back to the place where you live?: Yes      Need for Family Participation in Patient Care: Yes (Comment) Care giver support system in place?: Yes (comment)   Criminal Activity/Legal Involvement Pertinent to Current Situation/Hospitalization: No - Comment as needed  Activities of Daily Living Home Assistive Devices/Equipment: None ADL Screening (condition at time of admission) Patient's cognitive ability adequate to safely complete daily activities?: Yes Is the patient deaf or have difficulty hearing?: No Does the patient have difficulty seeing, even when wearing glasses/contacts?: No Does the patient have difficulty concentrating, remembering, or making decisions?: No Patient able to express need for assistance with ADLs?: Yes Does the patient have difficulty dressing or bathing?: No Independently  performs ADLs?: Yes (appropriate for developmental age) Does the patient have difficulty walking or climbing stairs?: No Weakness of Legs: None Weakness of Arms/Hands: None  Permission Sought/Granted   Permission granted to share information with : Yes, Verbal Permission Granted     Permission granted to share info w AGENCY: [employer]        Emotional Assessment Appearance:: Appears stated age     Orientation: : Oriented to Self, Oriented to Place, Oriented to  Time, Oriented to Situation Alcohol / Substance Use: Not Applicable Psych Involvement: No (comment)  Admission diagnosis:  Aortic valve disease [I35.9] SOB (shortness of breath) [R06.02] Cellulitis of left lower leg [L03.116] Chest pain, unspecified type [R07.9] Patient Active Problem List   Diagnosis Date Noted  . Aortic valve regurgitation   . SOB (shortness of breath)   . Aortic valve disease   . Foot ulcer, left ([place]) 06/26/2019  . Depression 05/28/2019  . Congestive heart failure (CHF) ([place]) 05/13/2019  . H/O heart valve replacement with mechanical valve   . Type II diabetes mellitus with renal manifestations ([place]) 05/12/2019  . Chest pain 05/12/2019  . Elevated troponin 05/12/2019  . CKD (chronic kidney disease), stage III ([place]) 05/12/2019  . Acute on chronic systolic (congestive) heart failure ([place]) 05/12/2019  . CRI (chronic renal insufficiency), stage 3 (moderate) ([place]) 04/09/2019  . Acute combined systolic and diastolic heart failure ([place]) 04/09/2019  . Essential hypertension 06/03/2017  . Dyslipidemia, goal LDL below 70 04/11/2017  . AS (aortic stenosis) 10/28/2012  . Acute on chronic systolic  heart failure, re-admitted 10/12/12 10/12/2012  . Nonischemic cardiomyopathy, EF 40-45% 10/12/2012  . S/P AVR, 09/29/12, St. Jude. (discharged 10/05/12) 09/30/2012  . Permanent atrial fibrillation, since 1994 09/30/2012  . Type 2 IDDM 09/30/2012  . OSA on CPAP 09/30/2012  . Normal coronary arteries at cath Oct  2013 09/30/2012  . Chronic anticoagulation 09/30/2012  . Severe aortic stenosis 09/25/2012   PCP:  Orpah Melter, MD Pharmacy:   [facility] [address], [place] [address] Phone: [phone] Fax: [facility] [place], [place] [address] Phone: [phone] Fax: [phone]     Social Determinants of Health (SDOH) Interventions    Readmission Risk Interventions No flowsheet data found.

## 2019-07-09 LAB — GLUCOSE, CAPILLARY
Glucose-Capillary: 120 mg/dL — ABNORMAL HIGH (ref 70–99)
Glucose-Capillary: 195 mg/dL — ABNORMAL HIGH (ref 70–99)
Glucose-Capillary: 120 mg/dL — ABNORMAL HIGH (ref 70–99)

## 2019-07-09 LAB — BASIC METABOLIC PANEL
Anion gap: 11 (ref 5–15)
BUN: 38 mg/dL — ABNORMAL HIGH (ref 8–23)
CO2: 25 mmol/L (ref 22–32)
Calcium: 8.9 mg/dL (ref 8.9–10.3)
Chloride: 99 mmol/L (ref 98–111)
Creatinine, Ser: 1.44 mg/dL — ABNORMAL HIGH (ref 0.61–1.24)
GFR calc Af Amer: 57 mL/min — ABNORMAL LOW (ref >60)
GFR calc non Af Amer: 49 mL/min — ABNORMAL LOW (ref >60)
Glucose, Bld: 169 mg/dL — ABNORMAL HIGH (ref 70–99)
Potassium: 4.1 mmol/L (ref 3.5–5.1)
Sodium: 135 mmol/L (ref 135–145)

## 2019-07-09 LAB — CBC
HCT: 36.7 % — ABNORMAL LOW (ref 39.0–52.0)
Hemoglobin: 11.6 g/dL — ABNORMAL LOW (ref 13.0–17.0)
MCH: 26 pg (ref 26.0–34.0)
MCHC: 31.6 g/dL (ref 30.0–36.0)
MCV: 82.1 fL (ref 80.0–100.0)
Platelets: 221 10*3/uL (ref 150–400)
RBC: 4.47 MIL/uL (ref 4.22–5.81)
RDW: 17.9 % — ABNORMAL HIGH (ref 11.5–15.5)
WBC: 9.3 10*3/uL (ref 4.0–10.5)
nRBC: 0 % (ref 0.0–0.2)

## 2019-07-09 LAB — HEPARIN LEVEL (UNFRACTIONATED)
Heparin Unfractionated: <0.1 IU/mL — ABNORMAL LOW (ref 0.30–0.70)
Heparin Unfractionated: <0.1 IU/mL — ABNORMAL LOW (ref 0.30–0.70)
Heparin Unfractionated: 0.12 IU/mL — ABNORMAL LOW (ref 0.30–0.70)

## 2019-07-09 LAB — PROTIME-INR
INR: 1.1 (ref 0.8–1.2)
INR: 1.1 (ref 0.8–1.2)
Prothrombin Time: 14.4 seconds (ref 11.4–15.2)
Prothrombin Time: 14.3 seconds (ref 11.4–15.2)

## 2019-07-09 MED ORDER — HEPARIN BOLUS VIA INFUSION
1500.0000 [IU] | Freq: Once | INTRAVENOUS | Status: AC
  Administered 2019-07-09: 1500 [IU] via INTRAVENOUS
  Filled 2019-07-09: qty 1500

## 2019-07-09 MED ORDER — WARFARIN SODIUM 7.5 MG PO TABS
15.0000 mg | ORAL_TABLET | Freq: Once | ORAL | Status: AC
  Administered 2019-07-09: 15 mg via ORAL
  Filled 2019-07-09: qty 2

## 2019-07-09 NOTE — Progress Notes (Signed)
ANTICOAGULATION CONSULT NOTE - Follow Up Consult  Pharmacy Consult for Heparin and Warfarin Indication: mechanical AVR, afib  Allergies  Allergen Reactions  . Iohexol Anaphylaxis  . Niacin And Related     Flushing with immediate realese  . Penicillins Other (See Comments)    Unknown.[name]  Did it involve swelling of the face/tongue/throat, SOB, or low BP? Unknown Did it involve sudden or severe rash/hives, skin peeling, or any reaction on the inside of your mouth or nose? Unknown Did you need to seek medical attention at a hospital or doctor's office? Unknown When did it last happen?Childhood If all above answers are "NO", may proceed with cephalosporin use.    Patient Measurements: Height: 5\' 11"  (180.3 cm) Weight: 253 lb 9.6 oz (115 kg)(scale a) IBW/kg (Calculated) : 75.3 Heparin Dosing Weight: 100 kg  Vital Signs: Temp: 98.5 F (36.9 C) (08/06 0544) BP: 109/46 (08/06 0544) Pulse Rate: 66 (08/06 0544)  Labs: Recent Labs    07/07/19 0350 07/08/19 0745 07/09/19 0421 07/09/19 0748  HGB 11.6* 12.3* 11.6*  --   HCT 37.8* 39.5 36.7*  --   PLT 228 242 221  --   LABPROT 14.2 16.6* 14.4 14.3  INR 1.1 1.4* 1.1 1.1  HEPARINUNFRC 0.52 0.43 <0.10* <0.10*  CREATININE 1.33* 1.29* 1.44*  --     Estimated Creatinine Clearance: 61.6 mL/min (A) (by C-G formula based on SCr of 1.44 mg/dL (H)).   Medications:  Scheduled:  . cholecalciferol  1,000 Units Oral Daily  . digoxin  0.125 mg Oral QHS  . escitalopram  10 mg Oral QPM  . ezetimibe  10 mg Oral Daily  . feeding supplement (GLUCERNA SHAKE)  237 mL Oral BID BM  . fenofibrate  160 mg Oral Daily  . Icosapent Ethyl  2 g Oral BID  . insulin aspart  0-20 Units Subcutaneous TID WC  . insulin aspart  0-5 Units Subcutaneous QHS  . insulin aspart  20 Units Subcutaneous TID WC  . insulin glargine  32 Units Subcutaneous BID  . metoprolol succinate  25 mg Oral BID  . mometasone-formoterol  2 puff  Inhalation BID  . multivitamin  1 tablet Oral Daily  . Ensure Max Protein  11 oz Oral Daily  . saccharomyces boulardii  250 mg Oral Q M,W,F  . sacubitril-valsartan  1 tablet Oral BID  . senna-docusate  2 tablet Oral BID  . sodium chloride flush  3 mL Intravenous Q12H  . sodium chloride flush  3 mL Intravenous Q12H  . torsemide  20 mg Oral QPM  . torsemide  40 mg Oral q morning - 10a  . vitamin C  1,000 mg Oral QPM  . Warfarin - Pharmacist Dosing Inpatient   Does not apply q1800   Infusions:  . sodium chloride 50 mL/hr at 06/30/19 0635  . sodium chloride 250 mL (07/01/19 0634)  . sodium chloride    . heparin 1,700 Units/hr (07/09/19 0127)    Assessment: 68 yo M on warfarin PTA for mechanical AVR + afib.  Warfarin was held and bridge initiated with heparin for cardiac cath and toe amputation.  Pt also needs redo AVR +/- CABG however this has been delayed due to recent toe infection and amputation.  Warfarin restarted 8/3.  Will need parenteral overlap until INR therapeutic given mechanical valve.  No bleeding noted.  Pt sub therapeutic on heparin at 1700 units/hr and INR has trended down.  Repeated labs to verify.  Noted pt report of  IV beeping overnight but currently no issues with infusion.  Warfarin dose PTA = 10mg  daily except 12.5mg  TTS  Goal of Therapy:  Heparin level 0.3-0.7 units/ml INR 2.5-3.5 Monitor platelets by anticoagulation protocol: Yes   Plan:  -Increase heparin drip to 2000 units/hr -Increase Warfarin 15 mg PO x 1 tonight -Repeat heparin level in 6 hours -Check daily INR, CBC, & heparin level -Monitor for s/sx of bleeding  [employer], Pharm.D., BCPS Clinical Pharmacist Pager: [phone] Clinical phone for 07/09/2019 from 8:30-4:00 is [phone].  **Pharmacist phone directory can now be found on [email or web address] ([email or web address]).  Listed under [place].  07/09/2019 9:46 AM

## 2019-07-09 NOTE — Progress Notes (Signed)
Pt will self administer CPAP when he is ready for bed. 

## 2019-07-09 NOTE — Progress Notes (Signed)
Nutrition Follow-up  DOCUMENTATION CODES:   Obesity unspecified  INTERVENTION:   -Continue Glucerna Shake po BID, each supplement provides 220 kcal and 10 grams of protein -Continue Ensure Max po daily, each supplement provides 150 kcal and 30 grams of protein -Continue MVI with minerals daily -Continue 1000 mg vitamin C daily  NUTRITION DIAGNOSIS:   Increased nutrient needs related to wound healing(cellulitis; left leg, diabetic ulcer; left; great toe) as evidenced by estimated needs.  Ongoing  GOAL:   Patient will meet greater than or equal to 90% of their needs  Progressing   MONITOR:   Skin, Supplement acceptance, PO intake, Labs, Weight trends  REASON FOR ASSESSMENT:   Consult Wound healing  ASSESSMENT:   67 year old male with past medical history significant of a-fib, chronic combined CHF, AVR in 2013, HTN, OSA on CPAP, T2DM, CKD3, and hepatitis. Pt admitted with LLE cellulitis and acute on chronic biventricular HF with severe aortic regurgitation  7/30- s/p Ultrasound-guided access of the right common femoral artery;  Aortogram;Bilateral lower extremity arteriogram with runoff; Mynx closure of the right common femoral artery 7/31- s/p Procedure: Rayamputation of left great toe  Reviewed I/O's: +210 ml x 24 hours and -7.3 L since admission  UOP: 2.8 L x 24 hours  Pt remains with good appetite, noted meal completion 100%. Pt has been accepting supplements (Glucerna and Ensure Max). Juven was d/c on last visit secondary to poor acceptance.   Pt awaiting therapeutic INR prior to discharge. Per CVTS notes, will awaiting until foot infection resolves prior to redo AVR/root replacement with possible CABG.   Labs reviewed: CBGS: 70-133 (inpatient orders for glycemic control are 0-20 units insulin aspart, 0-5 units insulin aspart q HS, 20 units insulin aspart TID with meals, and 32 units insulin glargine BID).   Diet Order:   Diet Order            Diet heart  healthy/carb modified Room service appropriate? Yes; Fluid consistency: Thin  Diet effective now              EDUCATION NEEDS:   No education needs have been identified at this time  Skin:  Skin Assessment: Skin Integrity Issues: Skin Integrity Issues:: Incisions Diabetic Ulcer: s/p lt great toe amputation Incisions: lt great toe s/p amputation  Last BM:  07/08/19  Height:   Ht Readings from Last 1 Encounters:  06/26/19 5\' 11"  (1.803 m)    Weight:   Wt Readings from Last 1 Encounters:  07/09/19 115 kg    Ideal Body Weight:  78.2 kg  BMI:  Body mass index is 35.37 kg/m.  Estimated Nutritional Needs:   Kcal:  2200-2400  Protein:  120-130  Fluid:  >2.2L    Torion Hulgan A. Jimmye Norman, RD, LDN, [place] Registered Dietitian II Certified Diabetes Care and Education Specialist Pager: [phone] After hours Pager: [phone]

## 2019-07-09 NOTE — Progress Notes (Signed)
Pharmacy called to check where heparin was infusing, if he was held at all last night etc due to levels low, I advised the heparin is infusing in midline, site WNL, pt says the pump beeped all night, seckured tubing to help prevent kinking, labs reordered and pt educated and verbalized understanding, Dr [place] in room for rounds and notified

## 2019-07-09 NOTE — Progress Notes (Addendum)
Progress Note  Patient Name: Joseph Hoffman Date of Encounter: 07/09/2019  Primary Cardiologist: Shelva Majestic, MD   Subjective   Feeling very well today. Worked with PT and walked the entire hallway.   Inpatient Medications    Scheduled Meds: . cholecalciferol  1,000 Units Oral Daily  . digoxin  0.125 mg Oral QHS  . escitalopram  10 mg Oral QPM  . ezetimibe  10 mg Oral Daily  . feeding supplement (GLUCERNA SHAKE)  237 mL Oral BID BM  . fenofibrate  160 mg Oral Daily  . Icosapent Ethyl  2 g Oral BID  . insulin aspart  0-20 Units Subcutaneous TID WC  . insulin aspart  0-5 Units Subcutaneous QHS  . insulin aspart  20 Units Subcutaneous TID WC  . insulin glargine  32 Units Subcutaneous BID  . metoprolol succinate  25 mg Oral BID  . mometasone-formoterol  2 puff Inhalation BID  . multivitamin  1 tablet Oral Daily  . Ensure Max Protein  11 oz Oral Daily  . saccharomyces boulardii  250 mg Oral Q M,W,F  . sacubitril-valsartan  1 tablet Oral BID  . senna-docusate  2 tablet Oral BID  . sodium chloride flush  3 mL Intravenous Q12H  . sodium chloride flush  3 mL Intravenous Q12H  . torsemide  20 mg Oral QPM  . torsemide  40 mg Oral q morning - 10a  . vitamin C  1,000 mg Oral QPM  . warfarin  15 mg Oral ONCE-1800  . Warfarin - Pharmacist Dosing Inpatient   Does not apply q1800   Continuous Infusions: . sodium chloride 50 mL/hr at 06/30/19 0635  . sodium chloride 250 mL (07/01/19 0634)  . sodium chloride    . heparin 2,000 Units/hr (07/09/19 1017)   PRN Meds: sodium chloride, sodium chloride, acetaminophen, albuterol, guaiFENesin-dextromethorphan, hydrALAZINE, labetalol, loperamide, nitroGLYCERIN, ondansetron (ZOFRAN) IV, oxyCODONE, sodium chloride flush, sodium chloride flush, sodium chloride flush   Vital Signs    Vitals:   07/08/19 2035 07/09/19 0544 07/09/19 0741 07/09/19 1037  BP: (!) 123/58 (!) 109/46  116/66  Pulse: 69 66  62  Resp: 18 18  16   Temp: 98.3 F (36.8  C) 98.5 F (36.9 C)    TempSrc: Oral     SpO2: 98% 98% 98% 99%  Weight:  115 kg    Height:        Intake/Output Summary (Last 24 hours) at 07/09/2019 1041 Last data filed at 07/09/2019 1017 Gross per 24 hour  Intake 3285.35 ml  Output 2775 ml  Net 510.35 ml   Last 3 Weights 07/09/2019 07/08/2019 07/07/2019  Weight (lbs) 253 lb 9.6 oz 253 lb 4.8 oz 254 lb 3.2 oz  Weight (kg) 115.032 kg 114.896 kg 115.304 kg      Telemetry    Afib, rate controlled - Personally Reviewed  ECG    N/a - Personally Reviewed  Physical Exam  Pleasant older WM, sitting up in the chair.  GEN: No acute distress.   Neck: No JVD Cardiac: Irreg Irreg, +s2 mechanical valve click. Respiratory: Clear to auscultation bilaterally. GI: Soft, nontender, non-distended  MS: Left foot bandage intact. Neuro:  Nonfocal  Psych: Normal affect   Labs    High Sensitivity Troponin:   Recent Labs  Lab 06/26/19 1315 06/26/19 1446  TROPONINIHS 96* 99*      Cardiac EnzymesNo results for input(s): TROPONINI in the last 168 hours. No results for input(s): TROPIPOC in the last 168 hours.  Chemistry Recent Labs  Lab 07/07/19 0350 07/08/19 0745 07/09/19 0421  NA 139 136 135  K 4.0 4.1 4.1  CL 100 100 99  CO2 30 24 25   GLUCOSE 68* 162* 169*  BUN 35* 34* 38*  CREATININE 1.33* 1.29* 1.44*  CALCIUM 9.2 9.1 8.9  GFRNONAA 54* 56* 49*  GFRAA >60 >60 57*  ANIONGAP 9 12 11      Hematology Recent Labs  Lab 07/07/19 0350 07/08/19 0745 07/09/19 0421  WBC 10.0 9.6 9.3  RBC 4.52 4.73 4.47  HGB 11.6* 12.3* 11.6*  HCT 37.8* 39.5 36.7*  MCV 83.6 83.5 82.1  MCH 25.7* 26.0 26.0  MCHC 30.7 31.1 31.6  RDW 17.7* 18.1* 17.9*  PLT 228 242 221    BNPNo results for input(s): BNP, PROBNP in the last 168 hours.   DDimer No results for input(s): DDIMER in the last 168 hours.   Radiology    No results found.  Cardiac Studies   RIGHT HEART CATH AND CORONARY ANGIOGRAPHY7/27/20  Conclusion  Conclusions: 1.  Multivessel coronary artery disease, including 50% mid LAD, 80% large OM2, 50% mid RCA, and 80-90% ostial rPDA stenoses. 2. Mildly to moderately elevated left and right heart filling pressures. 3. Mildly reduced Fick cardiac output/index.  Recommendations: 1. Gentle post-cath hydration for goal net even fluid balance today. Consider gentle diuresis, as renal function tolerates, beginning tomorrow. 2. Ongoing workup for redo aortic valve replacement +/- CABG per Drs. Claiborne Billings and [place]. 3. Initiate heparin infusion 2 hours after TR band removal. 4. Aggressive secondary prevention.   Echo 05/29/19 1. The left ventricle has moderate-severely reduced systolic function, with an ejection fraction of 30-35%. Left ventricular diffuse hypokinesis. 2. The right ventricle has severely reduced systolic function. The cavity was mildly enlarged. 3. Left atrial size was severely dilated. 4. Right atrial size was severely dilated. 5. The mitral valve is grossly normal. No evidence of mitral valve stenosis. 6. The tricuspid valve was grossly normal. 7. Aortic valve regurgitation is severe by color flow Doppler. 8. There is evidence of mild plaque in the descending aorta. 9. Moderate to severe global reduction in LV systolic function; mild RVE with severe RV dysfunction; severe biatrial enlargement; s/p mechanical AVR with mean gradient of 14 mmHg (both leaflets appear to be mobile); severe perivalvular AI; mild MR and  TR.  Patient Profile     68 y.o. male with a PMH of permanent atrial fibrillation, chronic combined CHF,St. JudeAVR in 2013, HTN, HLD, OSA on CPAP, DM type 2, CKD stage 3who has recently was found to have a decline in LV fxn with significant AVR perivalvular leakand recent nonhealing foot ulcer with development of gangrenous plantar aspect of left great toe.  Assessment & Plan    1. Acute on chronic Biventricular heart failure: ECHO 05/2019 EF 30-35% Severe AI, severe RV  dysfunction -Net-6.3L since admit.Weight 253lb today (relatively stable for past few days). Scr with mild increase at 1.4 -Now on Torsemide 40mg  AM and 20mg  PM and Entresto 24/26 BID as recommended. Goal of Scr less than 1.8. weight not at goal (249-251lb) but symptoms significantly improved.  - Continue Toprol XL 25mg  qd and digoxin 0.125mg  daily.  - Follow up with heart failure team as outpatient has been arranged  2. CAD: No chest pain. Cath with multivessel CAD on 7/27 - Needs CABG with AVR once healed from foot ulcer/cellulititus  - Continue current medical therapy  3. HLD: -05/13/2019: Cholesterol 111; HDL 19; LDL Cholesterol 74; Triglycerides 89; VLDL  18 -intolerant of statins. On Vascepa & zetia  4.Severe Aortic insuffiency in setting of mechanical AVR: - Pending re-do AVR once infection resolved. - Per [name] >> plan as outpatient. Appt noted.  - Warfarin restarted, on heparin bridge.Unfortunately INR dropped from 1.4>>1.1 today. Possible issues with IV overnight. If trending upwards tomorrow could consider lovenox bridge as an outpatient. He is followed in the Coumadin clinic at [facility].  5. Persistent atrial fibrillation: initially bradycardia. BB was reduced. Though has been bradycardiac on telemetry at times. May need to further adjust if symptomatic. Walked in the hallway and tolerated well today. - continue BB and digoxin - On heparin  6. CKD IV: Cr with mild bump at 1.4 today.  7. PVD s/p left great toe amp: with Dr. Carlis Abbott. Cleared from a VVS standpoint.  For questions or updates, please contact [place] Please consult [email or web address] for contact info under   Signed, Reino Bellis, NP  07/09/2019, 10:41 AM    I have examined the patient and reviewed assessment and plan and discussed with patient.  Agree with above as stated.    Coumadin for valve.  Want to see INR make a jump to at least 1.5 and trend in the right direction before discharge.   Until then, continue IV heparin.  POssible discharge tomorrow if INR goes up.   Ultimately needs CABG /AVR.  Larae Grooms

## 2019-07-09 NOTE — Progress Notes (Signed)
ANTICOAGULATION CONSULT NOTE - Follow Up Consult  Pharmacy Consult for Heparin and Warfarin Indication: mechanical AVR, afib  Allergies  Allergen Reactions  . Iohexol Anaphylaxis  . Niacin And Related     Flushing with immediate realese  . Penicillins Other (See Comments)    Unknown.[name]  Did it involve swelling of the face/tongue/throat, SOB, or low BP? Unknown Did it involve sudden or severe rash/hives, skin peeling, or any reaction on the inside of your mouth or nose? Unknown Did you need to seek medical attention at a hospital or doctor's office? Unknown When did it last happen?Childhood If all above answers are "NO", may proceed with cephalosporin use.    Patient Measurements: Height: 5\' 11"  (180.3 cm) Weight: 253 lb 9.6 oz (115 kg)(scale a) IBW/kg (Calculated) : 75.3 Heparin Dosing Weight: 100 kg  Vital Signs: BP: 116/66 (08/06 1037) Pulse Rate: 62 (08/06 1037)  Labs: Recent Labs    07/07/19 0350 07/08/19 0745 07/09/19 0421 07/09/19 0748 07/09/19 1626  HGB 11.6* 12.3* 11.6*  --   --   HCT 37.8* 39.5 36.7*  --   --   PLT 228 242 221  --   --   LABPROT 14.2 16.6* 14.4 14.3  --   INR 1.1 1.4* 1.1 1.1  --   HEPARINUNFRC 0.52 0.43 <0.10* <0.10* 0.12*  CREATININE 1.33* 1.29* 1.44*  --   --     Estimated Creatinine Clearance: 61.6 mL/min (A) (by C-G formula based on SCr of 1.44 mg/dL (H)).  Assessment: 68 yo M on warfarin PTA for mechanical AVR + afib.  Warfarin was held and bridge initiated with heparin for cardiac cath and toe amputation.  Pt also needs redo AVR +/- CABG however this has been delayed due to recent toe infection and amputation.  Warfarin restarted 8/3.  Will need parenteral overlap until INR therapeutic given mechanical valve.  No bleeding noted.  Hep lvl still low, now 0.12  Warfarin dose PTA = 10mg  daily except 12.5mg  TTS  Goal of Therapy:  Heparin level 0.3-0.7 units/ml INR 2.5-3.5 Monitor platelets by  anticoagulation protocol: Yes   Plan:  Bolus heparin 1500 units x 1 Increase gtt to 2250 units/hr Continue warfarin as previously ordered Next lvl 0200  Levester Fresh, PharmD, BCPS, BCCCP Clinical Pharmacist [phone]  Please check AMION for all [place] numbers  07/09/2019 6:46 PM

## 2019-07-09 NOTE — Progress Notes (Signed)
Physical Therapy Treatment Patient Details Name: Joseph Hoffman MRN: [id number] DOB: 1951-07-31 Today's Date: 07/09/2019    History of Present Illness Patient is a 68 year old male who presented to the hospital with cellulitus of left lower leg and chest pain. It was found that his aortic valve was dysfunctional.Left great toe infection s/p aortogram 7/30 with amputation 7/31. Plan for CABG and redo AVR in the near future. PMH: Sleep apnea, A-fib, DMII, AVR, CHF, anxiety, aortic stenois    PT Comments    Pt pleasant and reporting phantom pain impacting mobility and pain tolerance at this time with pt premedicated. Pt with significantly improved gait tolerance this session but limited in further mobility by need for BM. Pt requires mod assist to don post op shoe but able to demonstrate understanding of sternal precautions with mobility.   HR 77-90    Follow Up Recommendations  No PT follow up     Equipment Recommendations  Rolling walker with 5" wheels    Recommendations for Other Services       Precautions / Restrictions Precautions Precaution Comments: monitor HR and breathing  Other Brace: post op shoe LLE Restrictions Weight Bearing Restrictions: Yes LLE Weight Bearing: Partial weight bearing Other Position/Activity Restrictions: heel weight bearing    Mobility  Bed Mobility Overal bed mobility: Modified Independent             General bed mobility comments: with use of rail and HOB elevated able to pivot to EOB  Transfers     Transfers: Sit to/from Stand Sit to Stand: Supervision         General transfer comment: increased time with cues for hands on thighs to prep for sternal precautions with cues for anterior translation and sequence. pt unable to rise from low bed and elevated grossly 2 inches with pt able to rise without assist  Ambulation/Gait Ambulation/Gait assistance: Min guard Gait Distance (Feet): 200 Feet Assistive device: Rolling walker (2  wheeled) Gait Pattern/deviations: Step-to pattern;Decreased stride length   Gait velocity interpretation: >2.62 ft/sec, indicative of community ambulatory General Gait Details: cues for weight bearing on left heel and sequence with pt maintaining with reliance on bil UE on RW with increased time   Stairs             Wheelchair Mobility    Modified Rankin (Stroke Patients Only)       Balance                                            Cognition Arousal/Alertness: Awake/alert Behavior During Therapy: WFL for tasks assessed/performed Overall Cognitive Status: Within Functional Limits for tasks assessed                                        Exercises      General Comments        Pertinent Vitals/Pain Pain Assessment: 0-10 Pain Score: 6  Pain Location: left foot Pain Descriptors / Indicators: Aching;Burning Pain Intervention(s): Limited activity within patient's tolerance;Repositioned;Monitored during session;Premedicated before session    Home Living                      Prior Function            PT Goals (current  goals can now be found in the care plan section) Progress towards PT goals: Progressing toward goals    Frequency    Min 2X/week      PT Plan Current plan remains appropriate    Co-evaluation              AM-PAC PT "6 Clicks" Mobility   Outcome Measure  Help needed turning from your back to your side while in a flat bed without using bedrails?: None Help needed moving from lying on your back to sitting on the side of a flat bed without using bedrails?: None Help needed moving to and from a bed to a chair (including a wheelchair)?: A Little Help needed standing up from a chair using your arms (e.g., wheelchair or bedside chair)?: A Little Help needed to walk in hospital room?: A Little Help needed climbing 3-5 steps with a railing? : A Little 6 Click Score: 20    End of Session  Equipment Utilized During Treatment: Gait belt;Other (comment)(post op shoe) Activity Tolerance: Patient tolerated treatment well Patient left: with call bell/phone within reach;Other (comment)(on BSC with RN aware) Nurse Communication: Mobility status PT Visit Diagnosis: Unsteadiness on feet (R26.81);Other abnormalities of gait and mobility (R26.89);Difficulty in walking, not elsewhere classified (R26.2)     Time: [id number] PT Time Calculation (min) (ACUTE ONLY): 19 min  Charges:  $Gait Training: 8-22 mins                     Graycen Sadlon [name] Drown, PT Acute Rehabilitation Services Pager: [phone] Office: [place] 07/09/2019, 1:26 PM

## 2019-07-09 NOTE — Progress Notes (Signed)
Patient will self administer CPAP when he is ready for bed.

## 2019-07-09 NOTE — Progress Notes (Signed)
[facility]  Subjective  - No complaints.  States he may go home tomorrow.   Objective (!) 109/46 66 98.5 F (36.9 C) 18 98%  Intake/Output Summary (Last 24 hours) at 07/09/2019 0846 Last data filed at 07/09/2019 0824 Gross per 24 hour  Intake 2766.58 ml  Output 2775 ml  Net -8.42 ml    Left great toe amp c/d/i - skin edges viable - continues to look good  Laboratory Lab Results: Recent Labs    07/08/19 0745 07/09/19 0421  WBC 9.6 9.3  HGB 12.3* 11.6*  HCT 39.5 36.7*  PLT 242 221   BMET Recent Labs    07/08/19 0745 07/09/19 0421  NA 136 135  K 4.1 4.1  CL 100 99  CO2 24 25  GLUCOSE 162* 169*  BUN 34* 38*  CREATININE 1.29* 1.44*  CALCIUM 9.1 8.9    COAG Lab Results  Component Value Date   INR 1.1 07/09/2019   INR 1.4 (H) 07/08/2019   INR 1.1 07/07/2019   No results found for: PTT  Assessment/Planning:  68 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  Severe tibial disease, peroneal runoff.  Left great toe amp continues to look good, skins margins viable, dressing changed again this am.  Can be discharged from our standpoint and have arranged 3 week follow-up for suture removal/wound check.  Marty Heck 07/09/2019 8:46 AM --

## 2019-07-09 NOTE — Progress Notes (Signed)
PROGRESS NOTE  Joseph Hoffman [id number] DOB: Apr 11, 1951 DOA: 06/26/2019 PCP: Orpah Melter, MD  HPI/Recap of past 24 hours: Joseph Hoffman  is a 68 y.o. male, with medical history significant ofhistory significant ofhypertension, diabetes mellitus, asthma,sCHFwith EF 45%, CAD, atrial fibrillation on Coumadin,AS, s/p ofAVR with mechanical valveon coumadin, OSA on CPAP, CKD stage III, depression with anxiety, who presents with shortness of breath, and chest pain, as well he reports worsening left great toe ulcer with surrounding cellulitis  Patient reports he started to complain of chest pressure over last 48 hours, accompanied by dyspnea, mainly upon exertion, he did take sublingual nitro, with improvement of his chest pain, but today reported did not have any significant event on his chest pain, patient with known mechanical aortic valve insufficiency, plan to have urgent cath by Dr. Claiborne Billings next week, for anticipated aortic valve replacement by CT surgery next month.  Patient was noted to have left lower extremity diabetic great toe ulcer, report is been going on for last 2 weeks, with some drainage, as well with surrounding cellulitis, he denies any fever, chills, he did not seek any medical care for it.  In the ED patient was hypertensive, mildly tachypneic, retinae elevated at 2.1 from baseline 1.8, mild leukocytosis at 14 point 9K, high-sensitivity troponins were elevated at 96>99, INR supratherapeutic at 3.9, COVID-19 is negative, EKG showing A. fib, patient was seen by cardiology for chest pain, and hospitalist were requested to admit for multiple medical problems, with a presentation of infected diabetic ulcer, gangrenous left toe with cellulitis.    07/08/19: Patient was seen and examined at his bedside this morning.  No complaints. Patient denies headache, no fever/chills/night sweats, no chest pain, no palpitations, no shortness of breath, no abdominal pain, no weakness.  Patient has been restarted on Coumadin as there is no further surgical plans this hospitalization with heparin bridge until INR therapeutic.  No acute events overnight per nursing staff.  Assessment/Plan: Active Problems:   OSA on CPAP   Chronic anticoagulation   AS (aortic stenosis)   Essential hypertension   Acute combined systolic and diastolic heart failure (HCC)   CKD (chronic kidney disease), stage III (HCC)   Acute on chronic systolic (congestive) heart failure (HCC)   Depression   Foot ulcer, left (HCC)   Aortic valve regurgitation   SOB (shortness of breath)   Aortic valve disease  Peripheral vascular disease Sepsis secondary to L great toe gangrenous diabetic ulcer with surrounding cellulitis, POA Presented with leukocytosis with WBC 14 K, tachypnea with respiratory rate 31, Elevated CRP 9.5, sed rate 25.  Patient was started on broad-spectrum IV antibiotics with vancomycin and Rocephin.  Vascular surgery was consulted and patient underwent ray amputation of left great toe by vascular surgery, Dr. Carlis Abbott on 07/03/2019.  Blood cultures x2 showed no growth x5 days.  Infectious disease was consulted and antibiotics were de-escalated to Keflex.  Completed course of antibiotics while inpatient.  AKI on CKD2 Presented with creatinine of 2.19 with GFR of 29. Back to his baseline creatinine 1.1 with GFR greater than 60 on 07/04/2019.  Etiology likely secondary to volume overload versus ATN from underlying sepsis as above. --net positive 28mL past 24h and negative 7.2L since admission --Cr slightly bumped today from 1.29-->1.44 --Continue to avoid nephrotoxins --Continue to monitor urine output and volume status --Continue to monitor renal function daily  Uncontrolled diabetes, type II with hyperglycemia Hemoglobin A1c 8.7 on 05/13/2019.  Diabetic educator following --decreased Lantus to 32 units  twice daily on 8/4 --Continue NovoLog 20 units before meals --Continue close monitoring of  CBGs.  Acute on chronic hypoxic respiratory failure Acute on chronic combined diastolic and systolic CHF Presented with BNP greater than 600.  Chest x-ray with cardiomegaly and prominent bronchovascular markings; no consolidation.  Oxygen now titrated off. --Cardiology following, appreciate assistance --s/p right/left heart catheterization 06/29/2019 --net positive 225mL past 24h and negative 7.2L since admission --Weight 253.6lbs this morning --Continue torsemide 40 mg qAm and 20mg  qPM, Entresto 24/26 BID  --Continue to monitor strict I's and O's and daily weights --Attempt to get weight back to 249-251 pounds per cardiology prior to discharge.   --Planned outpatient follow-up with CHF service following discharge, scheduled on 07/17/2019  Permanent A. fib with intermittent slow ventricular response Toprol xl and digoxin doses reduced by cardiology; 25mg  p.o. BID and 0.125mg  PO daily respectively --Continue to monitor on telemetry  CAD No chest pain. Cath with multivessel CAD on 7/27. Status post aortic valve replacement and awaiting redo aortic valve replacement which has been postponed --CABG at time of redo AVR, postponed due to to infection. --Will need follow-up outpatient with CTS [name] for reschedule of CABG and redo AVR.  Severe aortic regurgitation post mechanical AVR --Restarted Coumadin 8/3 with heparin bridge; goal INR 2.5-3.5 for mechanical AV valve --INR 1.1-->1.4-->1.1 --Continue further management per pharmacy --Cardiology wants up trend of INR with it at least 1.5 prior to discharge with continued bridge with Lovenox and follow-up with outpatient Coumadin clinic.  HLD Cholesterol 111; HDL 19; LDL Cholesterol 74; Triglycerides 89; VLDL 18. Intolerant of statins.  --continue fenofibrate, icosapent, and zetia  Chest pain, resolved   OSA, compliant Continue CPAP at night   Code Status: Full code  Family Communication: None  Disposition Plan: Discharge to  home possibly in 1-2 days, pending cardiology sign off, cardiothoracic surgery plans to follow-up outpatient for consideration of timing of CABG/redo AV valve.  Will need INR to be therapeutic or show a consistent trend up and be at least 1.5 per cardiology prior to discharge.  Consultants:  Cardiology  Vascular surgery  Infectious disease  Procedures:  Heart cath on 06/29/2019  Left great toe amputation 07/03/19 by vascular surgery Dr. Carlis Abbott  Antimicrobials:  IV vancomycin, Rocephin  IV Flagyl  Keflex 8/1 - 8/6  DVT prophylaxis: Heparin drip.  Coumadin  Objective: Vitals:   07/08/19 2035 07/09/19 0544 07/09/19 0741 07/09/19 1037  BP: (!) 123/58 (!) 109/46  116/66  Pulse: 69 66  62  Resp: 18 18  16   Temp: 98.3 F (36.8 C) 98.5 F (36.9 C)    TempSrc: Oral     SpO2: 98% 98% 98% 99%  Weight:  115 kg    Height:        Intake/Output Summary (Last 24 hours) at 07/09/2019 1357 Last data filed at 07/09/2019 1220 Gross per 24 hour  Intake 3393.88 ml  Output 3125 ml  Net 268.88 ml   Filed Weights   07/07/19 0638 07/08/19 0420 07/09/19 0544  Weight: 115.3 kg 114.9 kg 115 kg    Exam:  . General: 68 y.o. year-old male obese in no acute distress.  Alert and oriented x3.   . Cardiovascular: Irregular rate and rhythm with no rubs or gallops.  Mechanical click appreciated, no JVD or thyromegaly noted.  1+ pitting edema bilateral lower extremities . Respiratory: Clear to auscultation no wheezes or rales.   . Abdomen: Obese nontender nondistended hypoactive bowel sounds present. . Musculoskeletal: Trace lower  extremity edema bilaterally.  Left foot in surgical dressing.   [name] Psychiatry: Mood is appropriate for condition and setting.   Data Reviewed: CBC: Recent Labs  Lab 07/05/19 0334 07/06/19 0536 07/07/19 0350 07/08/19 0745 07/09/19 0421  WBC 11.5* 10.6* 10.0 9.6 9.3  HGB 11.6* 12.1* 11.6* 12.3* 11.6*  HCT 37.9* 38.7* 37.8* 39.5 36.7*  MCV 84.8 82.2 83.6 83.5 82.1   PLT 250 228 228 242 [id number]   Basic Metabolic Panel: Recent Labs  Lab 07/05/19 0334 07/06/19 0536 07/07/19 0350 07/08/19 0745 07/09/19 0421  NA 136 137 139 136 135  K 3.7 4.0 4.0 4.1 4.1  CL 101 102 100 100 99  CO2 24 25 30 24 25   GLUCOSE 109* 108* 68* 162* 169*  BUN 36* 33* 35* 34* 38*  CREATININE 1.18 1.21 1.33* 1.29* 1.44*  CALCIUM 8.9 9.0 9.2 9.1 8.9   GFR: Estimated Creatinine Clearance: 61.6 mL/min (A) (by C-G formula based on SCr of 1.44 mg/dL (H)). Liver Function Tests: No results for input(s): AST, ALT, ALKPHOS, BILITOT, PROT, ALBUMIN in the last 168 hours. No results for input(s): LIPASE, AMYLASE in the last 168 hours. No results for input(s): AMMONIA in the last 168 hours. Coagulation Profile: Recent Labs  Lab 07/03/19 0536 07/07/19 0350 07/08/19 0745 07/09/19 0421 07/09/19 0748  INR 1.2 1.1 1.4* 1.1 1.1   Cardiac Enzymes: No results for input(s): CKTOTAL, CKMB, CKMBINDEX, TROPONINI in the last 168 hours. BNP (last 3 results) No results for input(s): PROBNP in the last 8760 hours. HbA1C: No results for input(s): HGBA1C in the last 72 hours. CBG: Recent Labs  Lab 07/08/19 1212 07/08/19 1655 07/08/19 2115 07/09/19 0631 07/09/19 1217  GLUCAP 151* 70 233* 120* 195*   Lipid Profile: No results for input(s): CHOL, HDL, LDLCALC, TRIG, CHOLHDL, LDLDIRECT in the last 72 hours. Thyroid Function Tests: No results for input(s): TSH, T4TOTAL, FREET4, T3FREE, THYROIDAB in the last 72 hours. Anemia Panel: No results for input(s): VITAMINB12, FOLATE, FERRITIN, TIBC, IRON, RETICCTPCT in the last 72 hours. Urine analysis:    Component Value Date/Time   COLORURINE YELLOW 09/25/2012 1506   APPEARANCEUR CLEAR 09/25/2012 1506   LABSPEC 1.045 (H) 09/25/2012 1506   PHURINE 6.0 09/25/2012 1506   GLUCOSEU >1000 (A) 09/25/2012 1506   HGBUR NEGATIVE 09/25/2012 1506   BILIRUBINUR NEGATIVE 09/25/2012 1506   KETONESUR NEGATIVE 09/25/2012 1506   PROTEINUR NEGATIVE  09/25/2012 1506   UROBILINOGEN 0.2 09/25/2012 1506   NITRITE NEGATIVE 09/25/2012 1506   LEUKOCYTESUR NEGATIVE 09/25/2012 1506   Sepsis Labs: @LABRCNTIP (procalcitonin:4,lacticidven:4)  ) Recent Results (from the past 240 hour(s))  Surgical pcr screen     Status: None   Collection Time: 07/01/19 11:23 PM   Specimen: Nasal Mucosa; Nasal Swab  Result Value Ref Range Status   MRSA, PCR NEGATIVE NEGATIVE Final   Staphylococcus aureus NEGATIVE NEGATIVE Final    Comment: (NOTE) The Xpert SA Assay (FDA approved for NASAL specimens in patients 51 years of age and older), is one component of a comprehensive surveillance program. It is not intended to diagnose infection nor to guide or monitor treatment. Performed at [facility], [place] [address] [place],  [address]   Aerobic/Anaerobic Culture (surgical/deep wound)     Status: None   Collection Time: 07/03/19  8:02 AM   Specimen: Bone; Tissue  Result Value Ref Range Status   Specimen Description BONE LEFT METATARSAL  Final   Special Requests PATIENT ON FOLLOWING ANCEF  Final   Gram Stain  Final    RARE WBC PRESENT, PREDOMINANTLY PMN NO ORGANISMS SEEN    Culture   Final    No growth aerobically or anaerobically. Performed at [facility], [place] [address], [place], [place] [address]    Report Status 07/08/2019 FINAL  Final      Studies: No results found.  Scheduled Meds: . cholecalciferol  1,000 Units Oral Daily  . digoxin  0.125 mg Oral QHS  . escitalopram  10 mg Oral QPM  . ezetimibe  10 mg Oral Daily  . feeding supplement (GLUCERNA SHAKE)  237 mL Oral BID BM  . fenofibrate  160 mg Oral Daily  . Icosapent Ethyl  2 g Oral BID  . insulin aspart  0-20 Units Subcutaneous TID WC  . insulin aspart  0-5 Units Subcutaneous QHS  . insulin aspart  20 Units Subcutaneous TID WC  . insulin glargine  32 Units Subcutaneous BID  . metoprolol succinate  25 mg Oral BID  . mometasone-formoterol  2 puff Inhalation BID  .  multivitamin  1 tablet Oral Daily  . Ensure Max Protein  11 oz Oral Daily  . saccharomyces boulardii  250 mg Oral Q M,W,F  . sacubitril-valsartan  1 tablet Oral BID  . senna-docusate  2 tablet Oral BID  . sodium chloride flush  3 mL Intravenous Q12H  . sodium chloride flush  3 mL Intravenous Q12H  . torsemide  20 mg Oral QPM  . torsemide  40 mg Oral q morning - 10a  . vitamin C  1,000 mg Oral QPM  . warfarin  15 mg Oral ONCE-1800  . Warfarin - Pharmacist Dosing Inpatient   Does not apply q1800    Continuous Infusions: . sodium chloride 50 mL/hr at 06/30/19 0635  . sodium chloride 250 mL (07/01/19 0634)  . sodium chloride    . heparin 2,000 Units/hr (07/09/19 1017)     LOS: 13 days     Gaege Sangalang J [place] DO [facility] Pager [phone]  If 7PM-7AM, please contact night-coverage [email or web address] Password [email or web address] 07/09/2019, 1:57 PM

## 2019-07-10 ENCOUNTER — Other Ambulatory Visit: Payer: Self-pay | Admitting: Cardiology

## 2019-07-10 DIAGNOSIS — Z79899 Other long term (current) drug therapy: Secondary | ICD-10-CM

## 2019-07-10 DIAGNOSIS — I2510 Atherosclerotic heart disease of native coronary artery without angina pectoris: Secondary | ICD-10-CM

## 2019-07-10 LAB — BASIC METABOLIC PANEL
Anion gap: 9 (ref 5–15)
BUN: 43 mg/dL — ABNORMAL HIGH (ref 8–23)
CO2: 25 mmol/L (ref 22–32)
Calcium: 8.7 mg/dL — ABNORMAL LOW (ref 8.9–10.3)
Chloride: 96 mmol/L — ABNORMAL LOW (ref 98–111)
Creatinine, Ser: 1.6 mg/dL — ABNORMAL HIGH (ref 0.61–1.24)
GFR calc Af Amer: 50 mL/min — ABNORMAL LOW (ref >60)
GFR calc non Af Amer: 43 mL/min — ABNORMAL LOW (ref >60)
Glucose, Bld: 117 mg/dL — ABNORMAL HIGH (ref 70–99)
Potassium: 4.4 mmol/L (ref 3.5–5.1)
Sodium: 130 mmol/L — ABNORMAL LOW (ref 135–145)

## 2019-07-10 LAB — CBC
HCT: 35.6 % — ABNORMAL LOW (ref 39.0–52.0)
Hemoglobin: 11.4 g/dL — ABNORMAL LOW (ref 13.0–17.0)
MCH: 26.3 pg (ref 26.0–34.0)
MCHC: 32 g/dL (ref 30.0–36.0)
MCV: 82.2 fL (ref 80.0–100.0)
Platelets: 189 10*3/uL (ref 150–400)
RBC: 4.33 MIL/uL (ref 4.22–5.81)
RDW: 17.9 % — ABNORMAL HIGH (ref 11.5–15.5)
WBC: 9.9 10*3/uL (ref 4.0–10.5)
nRBC: 0 % (ref 0.0–0.2)

## 2019-07-10 LAB — GLUCOSE, CAPILLARY
Glucose-Capillary: 108 mg/dL — ABNORMAL HIGH (ref 70–99)
Glucose-Capillary: 132 mg/dL — ABNORMAL HIGH (ref 70–99)
Glucose-Capillary: 148 mg/dL — ABNORMAL HIGH (ref 70–99)

## 2019-07-10 LAB — HEPARIN LEVEL (UNFRACTIONATED)
Heparin Unfractionated: 0.53 IU/mL (ref 0.30–0.70)
Heparin Unfractionated: 0.53 IU/mL (ref 0.30–0.70)

## 2019-07-10 LAB — PROTIME-INR
INR: 1.3 — ABNORMAL HIGH (ref 0.8–1.2)
Prothrombin Time: 16 seconds — ABNORMAL HIGH (ref 11.4–15.2)

## 2019-07-10 MED ORDER — OXYCODONE HCL 5 MG PO TABS: 5.0000 mg | ORAL_TABLET | Freq: Four times a day (QID) | ORAL | 0 refills | Status: AC | PRN

## 2019-07-10 MED ORDER — ENOXAPARIN SODIUM 120 MG/0.8ML ~~LOC~~ SOLN
120.0000 mg | Freq: Once | SUBCUTANEOUS | Status: AC
  Administered 2019-07-10: 120 mg via SUBCUTANEOUS
  Filled 2019-07-10: qty 0.8

## 2019-07-10 MED ORDER — SACUBITRIL-VALSARTAN 24-26 MG PO TABS: 1.0000 | ORAL_TABLET | Freq: Two times a day (BID) | ORAL | 3 refills | Status: DC

## 2019-07-10 MED ORDER — TORSEMIDE 20 MG PO TABS: 40.0000 mg | ORAL_TABLET | Freq: Every morning | ORAL | 3 refills | Status: DC

## 2019-07-10 MED ORDER — METOPROLOL SUCCINATE ER 25 MG PO TB24: 25.0000 mg | ORAL_TABLET | Freq: Two times a day (BID) | ORAL | 1 refills | Status: DC

## 2019-07-10 MED ORDER — TORSEMIDE 20 MG PO TABS: 20.0000 mg | ORAL_TABLET | Freq: Every evening | ORAL | 3 refills | Status: DC

## 2019-07-10 MED ORDER — ENOXAPARIN SODIUM 120 MG/0.8ML ~~LOC~~ SOLN: 120.0000 mg | Freq: Two times a day (BID) | SUBCUTANEOUS | 0 refills | Status: DC

## 2019-07-10 MED ORDER — WARFARIN SODIUM 7.5 MG PO TABS
15.0000 mg | ORAL_TABLET | ORAL | Status: AC
  Administered 2019-07-10: 15 mg via ORAL
  Filled 2019-07-10: qty 2

## 2019-07-10 NOTE — Progress Notes (Signed)
    S/P GT amputation Dry guaze dressing PRN to incision.  F/U arranged by Office. Elevate left foot while at rest heel weight bearing with ambulation.   Roxy Horseman PA-C

## 2019-07-10 NOTE — Consult Note (Signed)
   [facility] CM Inpatient Consult   07/10/2019  Joseph Hoffman 1951-11-09 [id number]  Follow up:  Patient will continued to be followed by [facility] Management Coordinator.  Patient to be followed by [facility] home health per inpatient [facility] RNCM.  Inpatient TOC team aware.   Natividad Brood, RN BSN [facility] Liaison  [phone] business mobile phone Toll free office [phone]  Fax number: [phone] [name]

## 2019-07-10 NOTE — Progress Notes (Signed)
Patient discharged.  Education provided on Lovenox injection and wound care, (dry dressing to L foot with Kerlex for pt to self manage until [facility] nurse to start care).  CCMD called, and telemetry discontinued, midline IV removed.  Patient education provided on medications and appointments and transported to [employer] by wheelchair.

## 2019-07-10 NOTE — Discharge Summary (Signed)
Physician Discharge Summary  Joseph Hoffman [id number] DOB: Jul 08, 1951 DOA: 06/26/2019  PCP: Joseph Melter, MD  Admit date: 06/26/2019 Discharge date: 07/10/2019  Admitted From: Home Disposition:  Home  Recommendations for Outpatient Follow-up:  1. Follow up with PCP in 1 week 2. Follow-up with Coumadin clinic on Monday 07/13/2019 w/ repeat INR and BMP at appointment 3. Imdur and hydralazine discontinued 4. Started on Entresto and increased torsemide dose per cardiology 5. Patient has follow-up scheduled with CHF service on 07/17/2019 6. Has follow-up scheduled with vascular surgery, Dr. Carlis Hoffman in 3 weeks for suture removal and wound check 7. Will need follow-up with cardiothoracic surgery for timing of CABG and AV valve repair  Home Health: Yes, PT/RN Equipment/Devices: Walker  Discharge Condition: Stable CODE STATUS: Full code Diet recommendation: Heart Healthy / Carb Modified  History of present illness:  [name] a68 y.o.male,with medical history significant ofhistory significant ofhypertension, diabetes mellitus, asthma,sCHFwith EF 45%, CAD, atrial fibrillation on Coumadin,AS, s/p ofAVR with mechanical valveon coumadin, OSA on CPAP, CKD stage III, depression with anxiety, who presents with shortness of breath,and chest pain,as well he reports worsening left great toe ulcer with surrounding cellulitis  Patient reports he started to complain of chest pressure over last 48 hours, accompanied by dyspnea, mainly upon exertion, he did take sublingual nitro, with improvement of his chest pain, but today reported did not have any significant event on his chest pain, patient with known mechanical aortic valve insufficiency, plan to have urgent cath by Joseph Hoffman next week, for anticipated aortic valve replacement by CT surgery next month.  Patient was noted to have left lower extremity diabetic great toe ulcer, report is been going on for last 2 weeks, with some  drainage, as well with surrounding cellulitis, he denies any fever, chills, he did not seek any medical care for it.  In the EDpatient was hypertensive, mildly tachypneic, retinae elevated at 2.1 from baseline 1.8, mild leukocytosis at 14 point 9K, high-sensitivity troponins were elevated at 96>99, INR supratherapeutic at 3.9, COVID-19 is negative, EKG showing A. fib, patient was seen by cardiology for chest pain, and hospitalist were requested to admit for multiple medical problems, with a presentation of infected diabetic ulcer, gangrenous left toe with cellulitis.  Hospital course:  Peripheral vascular disease Sepsis secondary to L great toe gangrenous diabetic ulcer with surrounding cellulitis, POA Presented with leukocytosis with WBC 14 K, tachypnea with respiratory rate 31, Elevated CRP 9.5, sed rate 25.  Patient was started on broad-spectrum IV antibiotics with vancomycin and Rocephin.  Vascular surgery was consulted and patient underwent ray amputation of left great toe by vascular surgery, Dr. Carlis Hoffman on 07/03/2019.  Blood cultures x2 showed no growth x5 days.  Infectious disease was consulted and antibiotics were de-escalated to Keflex.  Completed course of antibiotics while inpatient.  Home health RN ordered for monitoring wound and dressing changes.  AKI on CKD2 Presented with creatinine of 2.19 with GFR of 29. Back to his baseline creatinine 1.1 with GFR greater than 60 on 07/04/2019.  Etiology likely secondary to volume overload versus ATN from underlying sepsis as above.  Diuretics were adjusted by cardiology.  Creatinine improved and was 1.60 at time of discharge.  Repeat BMP at next visit.  Instructed to maintain daily weights at home and to maintain log to bring to next PCP and cardiology visit.  Uncontrolled diabetes, type II with hyperglycemia Hemoglobin A1c 8.7 on 05/13/2019.    Resume home metformin, Victoza, glimepiride, and Lantus.  Patient started to  continue monitor blood sugar  at home, maintain log to bring to PCP visit for further titration.  Acute on chronic hypoxic respiratory failure Acute on chronic combined diastolic and systolic CHF Presented with BNP greater than 600.  Chest x-ray with cardiomegaly and prominent bronchovascular markings; no consolidation.  Oxygen now titrated off.  Patient underwent right/left heart catheterization on 06/29/2019.  Patient was started on aggressive diuresis with IV furosemide and transition to torsemide 40 mg qAm and 20mg  qPM. Started on Entresto 24/26 BID .  Discharge weight was 258 pounds. Planned outpatient follow-up with CHF service following discharge, scheduled on 07/17/2019.  Permanent A. fib with intermittent slow ventricular response Continue Toprol xl and digoxin 25mg  p.o. BID and 0.125mg  PO daily respectively  CAD No chest pain. Cath with multivessel CAD on 7/27. Status post aortic valve replacement and awaiting redo aortic valve replacement which has been postponed. CABG and redo AVR, postponed due to to infection. Will need follow-up outpatient with CTS Joseph Hoffman for reschedule of CABG and redo AVR.  Severe aortic regurgitation post mechanical AVR Restarted Coumadin 8/3 with heparin bridge; goal INR 2.5-3.5 for mechanical AV valve.  INR at time of discharge 1.3.  Cardiology okay for discharge home with Lovenox bridge.  Resume home Coumadin dose/schedule and continue Lovenox bridge 120 mg subcutaneous twice daily.  Has follow-up at [facility] Cou[facility] clinic on Monday.  HLD Cholesterol 111; HDL 19; LDL Cholesterol 74; Triglycerides 89; VLDL 18. Intolerant of statins. Continue fenofibrate, icosapent, and zetia.  Chest pain: resolved   OSA, compliant: Continue CPAP at night  Discharge Diagnoses:  Active Problems:   OSA on CPAP   Chronic anticoagulation   AS (aortic stenosis)   Essential hypertension   Acute combined systolic and diastolic heart failure (HCC)   CKD (chronic kidney disease), stage III  (HCC)   Acute on chronic systolic (congestive) heart failure (HCC)   Depression   Foot ulcer, left (HCC)   Aortic valve regurgitation   Aortic valve disease   CAD, multiple vessel    Discharge Instructions  Discharge Instructions    (HEART FAILURE PATIENTS) Call MD:  Anytime you have any of the following symptoms: 1) 3 pound weight gain in 24 hours or 5 pounds in 1 week 2) shortness of breath, with or without a dry hacking cough 3) swelling in the hands, feet or stomach 4) if you have to sleep on extra pillows at night in order to breathe.   Complete by: As directed    (HEART FAILURE PATIENTS) Call MD:  Anytime you have any of the following symptoms: 1) 3 pound weight gain in 24 hours or 5 pounds in 1 week 2) shortness of breath, with or without a dry hacking cough 3) swelling in the hands, feet or stomach 4) if you have to sleep on extra pillows at night in order to breathe.   Complete by: As directed    Call MD for:  difficulty breathing, headache or visual disturbances   Complete by: As directed    Call MD for:  difficulty breathing, headache or visual disturbances   Complete by: As directed    Call MD for:  extreme fatigue   Complete by: As directed    Call MD for:  extreme fatigue   Complete by: As directed    Call MD for:  persistant dizziness or light-headedness   Complete by: As directed    Call MD for:  persistant dizziness or light-headedness   Complete by: As directed  Call MD for:  persistant nausea and vomiting   Complete by: As directed    Call MD for:  persistant nausea and vomiting   Complete by: As directed    Call MD for:  redness, tenderness, or signs of infection (pain, swelling, redness, odor or green/yellow discharge around incision site)   Complete by: As directed    Call MD for:  redness, tenderness, or signs of infection (pain, swelling, redness, odor or green/yellow discharge around incision site)   Complete by: As directed    Call MD for:  severe  uncontrolled pain   Complete by: As directed    Call MD for:  severe uncontrolled pain   Complete by: As directed    Call MD for:  temperature >100.4   Complete by: As directed    Call MD for:  temperature >100.4   Complete by: As directed    Change dressing (specify)   Complete by: As directed    Dressing change: once per day   Diet - low sodium heart healthy   Complete by: As directed    Consistent carbohydrate   Diet - low sodium heart healthy   Complete by: As directed    Heart Failure patients record your daily weight using the same scale at the same time of day   Complete by: As directed    Heart Failure patients record your daily weight using the same scale at the same time of day   Complete by: As directed    Increase activity slowly   Complete by: As directed    Increase activity slowly   Complete by: As directed    STOP any activity that causes chest pain, shortness of breath, dizziness, sweating, or exessive weakness   Complete by: As directed    STOP any activity that causes chest pain, shortness of breath, dizziness, sweating, or exessive weakness   Complete by: As directed      Allergies as of 07/10/2019      Reactions   Iohexol Anaphylaxis   Niacin And Related    Flushing with immediate realese   Penicillins Other (See Comments)   Unknown.[name] Did it involve swelling of the face/tongue/throat, SOB, or low BP? Unknown Did it involve sudden or severe rash/hives, skin peeling, or any reaction on the inside of your mouth or nose? Unknown Did you need to seek medical attention at a hospital or doctor's office? Unknown When did it last happen?Childhood If all above answers are "NO", may proceed with cephalosporin use.      Medication List    STOP taking these medications   hydrALAZINE 25 MG tablet Commonly known as: APRESOLINE   isosorbide mononitrate 30 MG 24 hr tablet Commonly known as: IMDUR     TAKE these medications    acetaminophen 325 MG tablet Commonly known as: TYLENOL Take 2 tablets (650 mg total) by mouth every 4 (four) hours as needed for pain or fever. What changed: reasons to take this   albuterol 108 (90 Base) MCG/ACT inhaler Commonly known as: VENTOLIN HFA Inhale 2 puffs into the lungs every 6 (six) hours as needed for wheezing or shortness of breath.   b complex vitamins tablet Take 1 tablet by mouth daily with breakfast.   B-D ULTRAFINE III SHORT PEN 31G X 8 MM Misc Generic drug: Insulin Pen Needle   cholecalciferol 1000 units tablet Commonly known as: VITAMIN D Take 1,000 Units by mouth daily with breakfast.   digoxin 0.125 MG  tablet Commonly known as: LANOXIN TAKE 1 TABLET EVERY DAY What changed: when to take this   Easy Touch Lancets 30G/Twist Misc   empagliflozin 25 MG Tabs tablet Commonly known as: Jardiance Take 25 mg by mouth daily. What changed: when to take this   enoxaparin 120 MG/0.8ML injection Commonly known as: LOVENOX Inject 0.8 mLs (120 mg total) into the skin 2 (two) times daily for 5 days.   escitalopram 10 MG tablet Commonly known as: LEXAPRO Take 10 mg by mouth at bedtime.   ezetimibe 10 MG tablet Commonly known as: ZETIA Take 1 tablet (10 mg total) by mouth daily. What changed: when to take this   fenofibrate 160 MG tablet Take 1 tablet (160 mg total) by mouth daily. What changed: when to take this   Fluticasone-Salmeterol 250-50 MCG/DOSE Aepb Commonly known as: ADVAIR Inhale 1 puff into the lungs 2 (two) times daily as needed (shortness of breath/asthma related symptoms).   glimepiride 2 MG tablet Commonly known as: AMARYL Take 2 mg by mouth daily with breakfast.   GLUCOSAMINE 1500 COMPLEX PO Take 1 tablet by mouth daily with supper.   Icosapent Ethyl 1 g Caps Commonly known as: Vascepa Take 2 capsules (2 g total) by mouth 2 (two) times daily. What changed: when to take this   insulin glargine 100 UNIT/ML injection Commonly known  as: LANTUS Inject 15-40 Units into the skin See admin instructions. Inject 15 - 40 units subcutaneously daily before breakfast (adjusted per CBG); inject 30-40 units at bedtime ( occasionally adjusted per CBG - usually 35 units)   liraglutide 18 MG/3ML Sopn Commonly known as: VICTOZA Inject 1.8 mg into the skin at bedtime.   metFORMIN 500 MG tablet Commonly known as: GLUCOPHAGE Take 2,000 mg by mouth daily with supper.   metoprolol succinate 25 MG 24 hr tablet Commonly known as: Toprol XL Take 1 tablet (25 mg total) by mouth 2 (two) times daily. What changed:   how much to take  when to take this   multivitamin with minerals Tabs tablet Take 1 tablet by mouth daily with breakfast. [facility]   nitroGLYCERIN 0.4 MG SL tablet Commonly known as: NITROSTAT Place 1 tablet (0.4 mg total) under the tongue every 5 (five) minutes as needed for chest pain.   oxyCODONE 5 MG immediate release tablet Commonly known as: Oxy IR/ROXICODONE Take 1 tablet (5 mg total) by mouth every 6 (six) hours as needed for up to 15 days for moderate pain or breakthrough pain.   saccharomyces boulardii 250 MG capsule Commonly known as: FLORASTOR Take 250 mg by mouth daily as needed (constipation).   sacubitril-valsartan 24-26 MG Commonly known as: ENTRESTO Take 1 tablet by mouth 2 (two) times daily.   torsemide 20 MG tablet Commonly known as: DEMADEX Take 1 tablet (20 mg total) by mouth every evening. What changed: when to take this   torsemide 20 MG tablet Commonly known as: DEMADEX Take 2 tablets (40 mg total) by mouth every morning. Start taking on: July 11, 2019 What changed: You were already taking a medication with the same name, and this prescription was added. Make sure you understand how and when to take each.   vitamin C 1000 MG tablet Take 1,000 mg by mouth daily with supper.   warfarin 10 MG tablet Commonly known as: COUMADIN Take 10 mg by mouth at bedtime. On  Tuesday, Thursday, Saturday add 1/2 5 mg tablet (2.5 mg) for a total dose of 12.5 mg What changed:  Another medication with the same name was changed. Make sure you understand how and when to take each.   warfarin 5 MG tablet Commonly known as: COUMADIN Take as instructed with 10 mg. What changed:   how much to take  how to take this  when to take this  additional instructions            Durable Medical Equipment  (From admission, onward)         Start     Ordered   07/07/19 1601  For home use only DME Walker rolling  Once    Question:  Patient needs a walker to treat with the following condition  Answer:  Gait disturbance   07/07/19 1601           Discharge Care Instructions  (From admission, onward)         Start     Ordered   07/10/19 0000  Change dressing (specify)    Comments: Dressing change: once per day   07/10/19 1118         Follow-up Information    Bensimhon, Shaune Pascal, MD. Go on 07/17/2019.   Specialty: Cardiology Why: 9:30 AM, [facility], Entrance C, parking code [place] information: [place] [address] [phone]        Marty Heck, MD In 3 weeks.   Specialty: Vascular Surgery Why: Office will call you to arrange your appt (sent) Contact information: [place] [address] [phone]        Joseph Melter, MD. Call in 1 week(s).   Specialty: Family Medicine Contact information: [place] [address] [phone]        Troy Sine, MD .   Specialty: Cardiology Contact information: [address] [place] [address] [phone]          Allergies  Allergen Reactions  . Iohexol Anaphylaxis  . Niacin And Related     Flushing with immediate realese  . Penicillins Other (See Comments)    Unknown.[name]  Did it involve swelling of the face/tongue/throat, SOB, or low BP?  Unknown Did it involve sudden or severe rash/hives, skin peeling, or any reaction on the inside of your mouth or nose? Unknown Did you need to seek medical attention at a hospital or doctor's office? Unknown When did it last happen?Childhood If all above answers are "NO", may proceed with cephalosporin use.    Consultations:  Cardiology  Cardio thoracic surgery -Joseph Hoffman  Vascular surgery - Dr. Carlis Hoffman   Procedures/Studies: Dg Chest Portable 1 View  Result Date: 06/26/2019 CLINICAL DATA:  Shortness of breath for 3 days. EXAM: PORTABLE CHEST 1 VIEW COMPARISON:  05/27/2019 and older exams. FINDINGS: Stable changes from prior cardiac surgery. Cardiac silhouette is mildly enlarged. No mediastinal or hilar masses. Lungs show prominent bronchovascular markings similar to the prior study. No evidence of pneumonia. No convincing pleural effusion and no pneumothorax. Skeletal structures are grossly intact. IMPRESSION: 1. Cardiomegaly with prominent bronchovascular markings but no overt pulmonary edema. No evidence of pneumonia. Electronically Signed   By: Lajean Manes M.D.   On: 06/26/2019 13:42   Dg Foot Complete Left  Result Date: 06/30/2019 CLINICAL DATA:  Osteomyelitis of left great toe and foot. Hx of diabetes, CHF, arthritis. EXAM: LEFT FOOT - COMPLETE 3+ VIEW COMPARISON:  None. FINDINGS: Soft tissue ulceration noted along the medial margin of the great toe  at and proximal to the level of the IP joint. No bone resorption or focal osteopenia to suggest osteomyelitis. No fractures.  No dislocation. There is narrowing of the interphalangeal joints consistent with mild osteoarthritis. Mild degenerative spurring is noted along the dorsal aspect the intertarsal articulations. Ossification is noted along plantar fascia and there are calcifications along the arteries of the ankle and foot. IMPRESSION: 1. No fracture. 2. No radiographic evidence of osteomyelitis. 3. Also along the medial margin of  the great toe. Electronically Signed   By: Lajean Manes M.D.   On: 06/30/2019 19:21   Vas [place] Burnard Bunting With/wo Tbi  Result Date: 06/27/2019 LOWER EXTREMITY DOPPLER STUDY Indications: Ulceration. High Risk Factors: Hypertension, hyperlipidemia, Diabetes. Other Factors: Permanemt A-Fib, CKD Stage 3, Chronic combined CHF.  Comparison Study: No previous exam available for comparison Performing Technologist: Birdena Crandall, [place] [employer]  Examination Guidelines: A complete evaluation includes at minimum, Doppler waveform signals and systolic blood pressure reading at the level of bilateral brachial, anterior tibial, and posterior tibial arteries, when vessel segments are accessible. Bilateral testing is considered an integral part of a complete examination. Photoelectric Plethysmograph (PPG) waveforms and toe systolic pressure readings are included as required and additional duplex testing as needed. Limited examinations for reoccurring indications may be performed as noted.  ABI Findings: +---------+------------------+-----+----------+--------+ Right    Rt Pressure (mmHg)IndexWaveform  Comment  +---------+------------------+-----+----------+--------+ Brachial 158                    triphasic          +---------+------------------+-----+----------+--------+ PTA      255               1.61 monophasic         +---------+------------------+-----+----------+--------+ DP       190               1.20 monophasic         +---------+------------------+-----+----------+--------+ Great Toe59                0.37                    +---------+------------------+-----+----------+--------+ +---------+------------------+-----+----------+-------------------------+ Left     Lt Pressure (mmHg)IndexWaveform  Comment                   +---------+------------------+-----+----------+-------------------------+ Brachial 148                    triphasic                            +---------+------------------+-----+----------+-------------------------+ PTA      115               0.73 monophasic                          +---------+------------------+-----+----------+-------------------------+ DP       134               0.85 monophasicprobably falsely elevated +---------+------------------+-----+----------+-------------------------+ Great Toe116               0.73                                     +---------+------------------+-----+----------+-------------------------+ +-------+-----------+-----------+------------+------------+ ABI/TBIToday's ABIToday's TBIPrevious ABIPrevious TBI +-------+-----------+-----------+------------+------------+ Right  n/c        0.37                                +-------+-----------+-----------+------------+------------+  Left   0.85       0.73                                +-------+-----------+-----------+------------+------------+ Difficult to evaluate the Doppler waveforms due to cardiac arrythmia and movement of the foot.  Summary: Right: Resting right ankle-brachial index indicates noncompressible right lower extremity arteries.The right toe-brachial index is abnormal. Difficult to evaluate the Doppler waveforms due to invountary movement of the foot and cardiac arrythmia. Left: Resting left ankle-brachial index indicates mild left lower extremity arterial disease. The left toe-brachial index is normal. Difficult to evaluate the Doppler waveforms due to cardiac arrythmia.  *See table(s) above for measurements and observations.  Electronically signed by Deitra Mayo MD on 06/27/2019 at 6:35:01 AM.    Final    Vas US Doppler Pre Cabg  Result Date: 06/30/2019 PREOPERATIVE VASCULAR EVALUATION  Indications:  Pre CABG. Ulceration. Risk Factors: Hypertension, hyperlipidemia, Diabetes, coronary artery disease,               PAD. Limitations:  Cardiac arrythmia, Respiratory interference Performing Technologist:  Birdena Crandall, [place] [employer] Supporting Technologist: June Leap RDMS, RVT  Examination Guidelines: A complete evaluation includes B-mode imaging, spectral Doppler, color Doppler, and power Doppler as needed of all accessible portions of each vessel. Bilateral testing is considered an integral part of a complete examination. Limited examinations for reoccurring indications may be performed as noted.  Right Carotid Findings: +----------+--------+-------+--------+------------+----------------------------+           PSV cm/sEDV    StenosisDescribe    Comments                                       cm/s                                                    +----------+--------+-------+--------+------------+----------------------------+ CCA Prox  110     1                          mild intimal wall changes    +----------+--------+-------+--------+------------+----------------------------+ CCA Distal110     1                          mild intimal wall changes    +----------+--------+-------+--------+------------+----------------------------+ ICA Prox  90      11     1-39%   heterogenousmild plaque                  +----------+--------+-------+--------+------------+----------------------------+ ICA Mid   46      9                          tortuous                     +----------+--------+-------+--------+------------+----------------------------+ ICA Distal84      24                         tortuous                     +----------+--------+-------+--------+------------+----------------------------+ ECA  205     4              heterogenousmoderate plaque at the                                                    origin                       +----------+--------+-------+--------+------------+----------------------------+ Portions of this table do not appear on this page. +----------+--------+-------+--------+------------+           PSV cm/sEDV cmsDescribeArm  Pressure +----------+--------+-------+--------+------------+ Subclavian94                     158          +----------+--------+-------+--------+------------+ +---------+--------+--+--------+-+ VertebralPSV cm/s53EDV cm/s5 +---------+--------+--+--------+-+ Left Carotid Findings: +----------+--------+--------+--------+--------+------------------------------+           PSV cm/sEDV cm/sStenosisDescribeComments                       +----------+--------+--------+--------+--------+------------------------------+ CCA Prox  80      1                       mild intimal wall changes      +----------+--------+--------+--------+--------+------------------------------+ CCA Distal96      2                       mild intimal wall changes      +----------+--------+--------+--------+--------+------------------------------+ ICA Prox                  Occluded        plaque with acoustic shadowing +----------+--------+--------+--------+--------+------------------------------+ ICA Mid                                   no flow noted                  +----------+--------+--------+--------+--------+------------------------------+ ICA Distal                                no flow noted                  +----------+--------+--------+--------+--------+------------------------------+ ECA       126                             difficult to image             +----------+--------+--------+--------+--------+------------------------------+ +----------+--------+--------+--------+------------+ SubclavianPSV cm/sEDV cm/sDescribeArm Pressure +----------+--------+--------+--------+------------+           139                     132          +----------+--------+--------+--------+------------+ Reversed flow just distal to the bulb occluded ICA ABI Findings: +--------+------------------+-----+---------+--------+ Right   Rt Pressure (mmHg)IndexWaveform Comment   +--------+------------------+-----+---------+--------+ [id number]                    triphasic         +--------+------------------+-----+---------+--------+ +--------+------------------+-----+--------+----------------+ Left    Lt Pressure (mmHg)IndexWaveformComment          +--------+------------------+-----+--------+----------------+ [id number]  biphasicsounds triphasic +--------+------------------+-----+--------+----------------+ +-------+---------------+----------------+ ABI/TBIToday's ABI/TBIPrevious ABI/TBI +-------+---------------+----------------+ Right                 [place]/0.37          +-------+---------------+----------------+ Left                  0.85./0.73       +-------+---------------+----------------+ ABI/TBI exam completed 06/26/2019.  Right Doppler Findings: +-----------+--------+-----+---------+--------+ Site       PressureIndexDoppler  Comments +-----------+--------+-----+---------+--------+ Brachial   158          triphasic         +-----------+--------+-----+---------+--------+ Radial                  triphasic         +-----------+--------+-----+---------+--------+ Ulnar                   triphasic         +-----------+--------+-----+---------+--------+ Palmar Arch                      normal   +-----------+--------+-----+---------+--------+  Left Doppler Findings: +-----------+--------+-----+---------+----------------+ Site       PressureIndexDoppler  Comments         +-----------+--------+-----+---------+----------------+ Brachial   132          biphasic sounds triphasic +-----------+--------+-----+---------+----------------+ Radial                  triphasic                 +-----------+--------+-----+---------+----------------+ Ulnar                   triphasic                 +-----------+--------+-----+---------+----------------+ Palmar Arch                      normal            +-----------+--------+-----+---------+----------------+  Summary: Right Carotid: Velocities in the right ICA are consistent with a 1-39% stenosis. 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 Vertebrals:  Bilateral vertebral arteries demonstrate antegrade flow. Subclavians: Normal flow hemodynamics were seen in bilateral subclavian              arteries. Right ABI: Resting right ankle-brachial index indicates noncompressible right lower extremity arteries.The right toe-brachial index is abnormal. Left ABI: Resting left ankle-brachial index indicates mild left lower extremity arterial disease. The left toe-brachial index is abnormal. ABI may be falsely elevated. Right Upper Extremity: Doppler waveforms remain within normal limits with right radial compression. Doppler waveforms remain within normal limits with right ulnar compression. Left Upper Extremity: Doppler waveforms remain within normal limits with left radial compression. Doppler waveforms remain within normal limits with left ulnar compression.  Electronically signed by Deitra Mayo MD on 06/30/2019 at 5:55:59 PM.    Final    Vas [place] Lower Extremity Arterial Duplex  Result Date: 06/30/2019 LOWER EXTREMITY ARTERIAL DUPLEX STUDY Indications: Ulceration, and peripheral artery disease. High Risk Factors: Hypertension, hyperlipidemia, Diabetes. Other Factors: Permanent A-Fib,KD stage 3, Chronic combimed CHF,.  Current ABI: 06/26/2019 RT [place] Lt 0.85 Limitations: Constant drawing of the leg when imaging the lower leg, Cardiac              arrythmia Comparison Study: No imaging comparison available only ABI/TBI exam of  06/26/2019 Performing Technologist: Toma Copier RVS  Examination Guidelines: A complete evaluation includes B-mode imaging, spectral Doppler, color Doppler, and power Doppler as needed of all accessible portions of each vessel. Bilateral testing is considered an integral part of a complete  examination. Limited examinations for reoccurring indications may be performed as noted.  +--------+--------+-----+--------+--------+--------------+ RIGHT   PSV cm/sRatioStenosisWaveformComments       +--------+--------+-----+--------+--------+--------------+ CFA Prox86                   biphasicminimal plaque +--------+--------+-----+--------+--------+--------------+  +----------+--------+-----+--------+---------+--------------+ LEFT      PSV cm/sRatioStenosisWaveform Comments       +----------+--------+-----+--------+---------+--------------+ CFA Prox  105                  biphasic                +----------+--------+-----+--------+---------+--------------+ CFA Distal145                  biphasic minimal plaque +----------+--------+-----+--------+---------+--------------+ DFA       181                  biphasic mild plaque    +----------+--------+-----+--------+---------+--------------+ SFA Prox  164                  biphasic mild plaque    +----------+--------+-----+--------+---------+--------------+ SFA Mid   120                  biphasic mild plaque    +----------+--------+-----+--------+---------+--------------+ SFA Distal125                  triphasicmild plaque    +----------+--------+-----+--------+---------+--------------+ POP Prox  108                  biphasic miild plaque   +----------+--------+-----+--------+---------+--------------+ POP Distal83                   biphasic mild plaque    +----------+--------+-----+--------+---------+--------------+ ATA Distal29                                           +----------+--------+-----+--------+---------+--------------+ PTA Mid   56                   biphasic mild plaque    +----------+--------+-----+--------+---------+--------------+ PTA Distal148                  biphasic mild plaque    +----------+--------+-----+--------+---------+--------------+ Technically  difficult to evaluate due to cardiac arrythmia and constant drawing of the lower leg. There is mild irregular plaque throughout. Unable to note a significant stenosis throughout.  Summary: See table(s) above for measurements and observations. Electronically signed by Deitra Mayo MD on 06/30/2019 at 5:54:26 PM.    Final      Procedures:  Heart cath on 06/29/2019  Left great toe amputation 07/03/19 by vascular surgery Dr. Carlis Hoffman  TEE 05/29/2019: IMPRESSIONS    1. The left ventricle has moderate-severely reduced systolic function, with an ejection fraction of 30-35%. Left ventricular diffuse hypokinesis.  2. The right ventricle has severely reduced systolic function. The cavity was mildly enlarged.  3. Left atrial size was severely dilated.  4. Right atrial size was severely dilated.  5. The mitral valve is grossly normal. No evidence of mitral valve stenosis.  6. The tricuspid valve was grossly normal.  7. Aortic valve regurgitation is severe by  color flow Doppler.  8. There is evidence of mild plaque in the descending aorta.  9. Moderate to severe global reduction in LV systolic function; mild RVE with severe RV dysfunction; severe biatrial enlargement; s/p mechanical AVR with mean gradient of 14 mmHg (both leaflets appear to be mobile); severe perivalvular AI; mild MR and  TR.  Antimicrobials:  IV vancomycin, Rocephin  IV Flagyl  Keflex 8/1 - 8/6  Subjective: Patient was seen and examined at his bedside this morning.  No complaints. Patient denies headache, no fever/chills/night sweats, no chest pain, no palpitations, no shortness of breath, no abdominal pain, no weakness.  Ready for discharge home today, okay for Lovenox bridge per cardiology.  Has follow-up with Coumadin clinic on Monday and follow-up scheduled with cardiology, cardiothoracic surgery, and vascular surgery.  No acute events overnight per nursing staff.   Discharge Exam: Vitals:   07/10/19 0733 07/10/19  0955  BP:  127/64  Pulse:  65  Resp:    Temp:    SpO2: 96%    Vitals:   07/09/19 1918 07/10/19 0526 07/10/19 0733 07/10/19 0955  BP:  (!) 122/55  127/64  Pulse:  76  65  Resp:  15    Temp:  98.2 F (36.8 C)    TempSrc:  Oral    SpO2: 99% 93% 96%   Weight:  117.2 kg    Height:         General: 68 y.o. year-old male obese in no acute distress.  Alert and oriented x3.    Cardiovascular: Irregular rate and rhythm with no rubs or gallops.  Mechanical click appreciated, no JVD or thyromegaly noted.  1+ pitting edema bilateral lower extremities  Respiratory: Clear to auscultation no wheezes or rales.    Abdomen: Obese nontender nondistended hypoactive bowel sounds present.  Musculoskeletal: 1+ pitting edema lower extremity edema bilaterally.  Left foot in surgical dressing.    Psychiatry: Mood is appropriate for condition and setting.    The results of significant diagnostics from this hospitalization (including imaging, microbiology, ancillary and laboratory) are listed below for reference.     Microbiology: Recent Results (from the past 240 hour(s))  Surgical pcr screen     Status: None   Collection Time: 07/01/19 11:23 PM   Specimen: Nasal Mucosa; Nasal Swab  Result Value Ref Range Status   MRSA, PCR NEGATIVE NEGATIVE Final   Staphylococcus aureus NEGATIVE NEGATIVE Final    Comment: (NOTE) The Xpert SA Assay (FDA approved for NASAL specimens in patients 39 years of age and older), is one component of a comprehensive surveillance program. It is not intended to diagnose infection nor to guide or monitor treatment. Performed at [facility], [place] [address] [place], [place] [address]   Aerobic/Anaerobic Culture (surgical/deep wound)     Status: None   Collection Time: 07/03/19  8:02 AM   Specimen: Bone; Tissue  Result Value Ref Range Status   Specimen Description BONE LEFT METATARSAL  Final   Special Requests PATIENT ON FOLLOWING ANCEF  Final   Gram Stain    Final    RARE WBC PRESENT, PREDOMINANTLY PMN NO ORGANISMS SEEN    Culture   Final    No growth aerobically or anaerobically. Performed at [facility], [place] [address], [place], [place] [address]    Report Status 07/08/2019 FINAL  Final     Labs: BNP (last 3 results) Recent Labs    05/12/19 1426 05/28/19 0617 06/26/19 1315  BNP 297.0*  342.8* [id number]*   Basic Metabolic Panel: Recent Labs  Lab 07/06/19 0536 07/07/19 0350 07/08/19 0745 07/09/19 0421 07/10/19 0217  NA 137 139 136 135 130*  K 4.0 4.0 4.1 4.1 4.4  CL 102 100 100 99 96*  CO2 25 30 24 25 25   GLUCOSE 108* 68* 162* 169* 117*  BUN 33* 35* 34* 38* 43*  CREATININE 1.21 1.33* 1.29* 1.44* 1.60*  CALCIUM 9.0 9.2 9.1 8.9 8.7*   Liver Function Tests: No results for input(s): AST, ALT, ALKPHOS, BILITOT, PROT, ALBUMIN in the last 168 hours. No results for input(s): LIPASE, AMYLASE in the last 168 hours. No results for input(s): AMMONIA in the last 168 hours. CBC: Recent Labs  Lab 07/06/19 0536 07/07/19 0350 07/08/19 0745 07/09/19 0421 07/10/19 0217  WBC 10.6* 10.0 9.6 9.3 9.9  HGB 12.1* 11.6* 12.3* 11.6* 11.4*  HCT 38.7* 37.8* 39.5 36.7* 35.6*  MCV 82.2 83.6 83.5 82.1 82.2  PLT 228 228 242 221 189   Cardiac Enzymes: No results for input(s): CKTOTAL, CKMB, CKMBINDEX, TROPONINI in the last 168 hours. BNP: Invalid input(s): POCBNP CBG: Recent Labs  Lab 07/09/19 0631 07/09/19 1217 07/09/19 1726 07/09/19 2332 07/10/19 0610  GLUCAP 120* 195* 120* 132* 108*   D-Dimer No results for input(s): DDIMER in the last 72 hours. Hgb A1c No results for input(s): HGBA1C in the last 72 hours. Lipid Profile No results for input(s): CHOL, HDL, LDLCALC, TRIG, CHOLHDL, LDLDIRECT in the last 72 hours. Thyroid function studies No results for input(s): TSH, T4TOTAL, T3FREE, THYROIDAB in the last 72 hours.  Invalid input(s): FREET3 Anemia work up No results for input(s): VITAMINB12, FOLATE, FERRITIN, TIBC, IRON,  RETICCTPCT in the last 72 hours. Urinalysis    Component Value Date/Time   COLORURINE YELLOW 09/25/2012 1506   APPEARANCEUR CLEAR 09/25/2012 1506   LABSPEC 1.045 (H) 09/25/2012 1506   PHURINE 6.0 09/25/2012 1506   GLUCOSEU >1000 (A) 09/25/2012 1506   HGBUR NEGATIVE 09/25/2012 1506   BILIRUBINUR NEGATIVE 09/25/2012 1506   KETONESUR NEGATIVE 09/25/2012 1506   PROTEINUR NEGATIVE 09/25/2012 1506   UROBILINOGEN 0.2 09/25/2012 1506   NITRITE NEGATIVE 09/25/2012 1506   LEUKOCYTESUR NEGATIVE 09/25/2012 1506   Sepsis Labs Invalid input(s): PROCALCITONIN,  WBC,  LACTICIDVEN Microbiology Recent Results (from the past 240 hour(s))  Surgical pcr screen     Status: None   Collection Time: 07/01/19 11:23 PM   Specimen: Nasal Mucosa; Nasal Swab  Result Value Ref Range Status   MRSA, PCR NEGATIVE NEGATIVE Final   Staphylococcus aureus NEGATIVE NEGATIVE Final    Comment: (NOTE) The Xpert SA Assay (FDA approved for NASAL specimens in patients 20 years of age and older), is one component of a comprehensive surveillance program. It is not intended to diagnose infection nor to guide or monitor treatment. Performed at [facility], [place] [address], [place], [place] [address]   Aerobic/Anaerobic Culture (surgical/deep wound)     Status: None   Collection Time: 07/03/19  8:02 AM   Specimen: Bone; Tissue  Result Value Ref Range Status   Specimen Description BONE LEFT METATARSAL  Final   Special Requests PATIENT ON FOLLOWING ANCEF  Final   Gram Stain   Final    RARE WBC PRESENT, PREDOMINANTLY PMN NO ORGANISMS SEEN    Culture   Final    No growth aerobically or anaerobically. Performed at [facility], [place] [address] [place], [place] [address]    Report Status 07/08/2019 FINAL  Final  Time coordinating discharge: Over 30 minutes  SIGNED:   Eric J [place] DO  [facility] 07/10/2019, 11:21 AM

## 2019-07-10 NOTE — Progress Notes (Signed)
ANTICOAGULATION CONSULT NOTE - Follow Up Consult  Pharmacy Consult for Heparin and Warfarin Indication: mechanical AVR, afib  Allergies  Allergen Reactions  . Iohexol Anaphylaxis  . Niacin And Related     Flushing with immediate realese  . Penicillins Other (See Comments)    Unknown.[name]  Did it involve swelling of the face/tongue/throat, SOB, or low BP? Unknown Did it involve sudden or severe rash/hives, skin peeling, or any reaction on the inside of your mouth or nose? Unknown Did you need to seek medical attention at a hospital or doctor's office? Unknown When did it last happen?Childhood If all above answers are "NO", may proceed with cephalosporin use.    Patient Measurements: Height: 5\' 11"  (180.3 cm) Weight: 258 lb 6.4 oz (117.2 kg) IBW/kg (Calculated) : 75.3 Heparin Dosing Weight: 100 kg  Vital Signs: Temp: 98.2 F (36.8 C) (08/07 0526) Temp Source: Oral (08/07 0526) BP: 122/55 (08/07 0526) Pulse Rate: 76 (08/07 0526)  Labs: Recent Labs    07/08/19 0745 07/09/19 0421 07/09/19 0748 07/09/19 1626 07/10/19 0217  HGB 12.3* 11.6*  --   --  11.4*  HCT 39.5 36.7*  --   --  35.6*  PLT 242 221  --   --  189  LABPROT 16.6* 14.4 14.3  --  16.0*  INR 1.4* 1.1 1.1  --  1.3*  HEPARINUNFRC 0.43 <0.10* <0.10* 0.12* 0.53  CREATININE 1.29* 1.44*  --   --  1.60*    Estimated Creatinine Clearance: 56 mL/min (A) (by C-G formula based on SCr of 1.6 mg/dL (H)).   Medications:  Scheduled:  . cholecalciferol  1,000 Units Oral Daily  . digoxin  0.125 mg Oral QHS  . escitalopram  10 mg Oral QPM  . ezetimibe  10 mg Oral Daily  . feeding supplement (GLUCERNA SHAKE)  237 mL Oral BID BM  . fenofibrate  160 mg Oral Daily  . Icosapent Ethyl  2 g Oral BID  . insulin aspart  0-20 Units Subcutaneous TID WC  . insulin aspart  0-5 Units Subcutaneous QHS  . insulin aspart  20 Units Subcutaneous TID WC  . insulin glargine  32 Units Subcutaneous BID  .  metoprolol succinate  25 mg Oral BID  . mometasone-formoterol  2 puff Inhalation BID  . multivitamin  1 tablet Oral Daily  . Ensure Max Protein  11 oz Oral Daily  . saccharomyces boulardii  250 mg Oral Q M,W,F  . sacubitril-valsartan  1 tablet Oral BID  . senna-docusate  2 tablet Oral BID  . sodium chloride flush  3 mL Intravenous Q12H  . sodium chloride flush  3 mL Intravenous Q12H  . torsemide  20 mg Oral QPM  . torsemide  40 mg Oral q morning - 10a  . vitamin C  1,000 mg Oral QPM  . Warfarin - Pharmacist Dosing Inpatient   Does not apply q1800   Infusions:  . sodium chloride 50 mL/hr at 06/30/19 0635  . sodium chloride 250 mL (07/01/19 0634)  . sodium chloride    . heparin 2,250 Units/hr (07/10/19 0741)    Assessment: 68 yo M on warfarin PTA for mechanical AVR + afib.  Warfarin was held and bridge initiated with heparin for cardiac cath and toe amputation.  Pt also needs redo AVR +/- CABG however this has been delayed due to recent toe infection and amputation.  Warfarin restarted 8/3.  Will need parenteral overlap until INR therapeutic given mechanical valve.   Heparin  level therapeutic at 0.53 on 2250 units/hr. INR subtherapeutic at 1.3 but beginning to trend up. No bleeding noted, CBC stable.  Warfarin dose PTA = 10mg  daily except 12.5mg  TTS  Goal of Therapy:  Heparin level 0.3-0.7 units/ml INR 2.5-3.5 Monitor platelets by anticoagulation protocol: Yes   Plan:  -Continue heparin drip at 2250 units/hr -Warfarin 15 mg PO x 1 tonight -Check daily INR, CBC, & heparin level -Monitor for s/sx of bleeding -Plan to d/c on Lovenox today   Thank you for involving pharmacy in this patient's care.  Renold Genta, PharmD, BCPS Clinical Pharmacist Clinical phone for 07/10/2019 until 3p is [phone] 07/10/2019 8:55 AM  **Pharmacist phone directory can be found on [email or web address] listed under [place]**

## 2019-07-10 NOTE — Discharge Instructions (Signed)
Heart Failure Eating Plan Heart failure, also called congestive heart failure, occurs when your heart does not pump blood well enough to meet your body's needs for oxygen-rich blood. Heart failure is a long-term (chronic) condition. Living with heart failure can be challenging. However, following your health care provider's instructions about a healthy lifestyle and working with a diet and nutrition specialist (dietitian) to choose the right foods may help to improve your symptoms. What are tips for following this plan? Reading food labels  Check food labels for the amount of sodium per serving. Choose foods that have less than 140 mg (milligrams) of sodium in each serving.  Check food labels for the number of calories per serving. This is important if you need to limit your daily calorie intake to lose weight.  Check food labels for the serving size. If you eat more than one serving, you will be eating more sodium and calories than what is listed on the label.  Look for foods that are labeled as "sodium-free," "very low sodium," or "low sodium." ? Foods labeled as "reduced sodium" or "lightly salted" may still have more sodium than what is recommended for you. Cooking  Avoid adding salt when cooking. Ask your health care provider or dietitian before using salt substitutes.  Season food with salt-free seasonings, spices, or herbs. Check the label of seasoning mixes to make sure they do not contain salt.  Cook with heart-healthy oils, such as olive, canola, soybean, or sunflower oil.  Do not fry foods. Cook foods using low-fat methods, such as baking, boiling, grilling, and broiling.  Limit unhealthy fats when cooking by: ? Removing the skin from poultry, such as chicken. ? Removing all visible fats from meats. ? Skimming the fat off from stews, soups, and gravies before serving them. Meal planning   Limit your intake of: ? Processed, canned, or pre-packaged foods. ? Foods that are  high in trans fat, such as fried foods. ? Sweets, desserts, sugary drinks, and other foods with added sugar. ? Full-fat dairy products, such as whole milk.  Eat a balanced diet that includes: ? 4-5 servings of fruit each day and 4-5 servings of vegetables each day. At each meal, try to fill half of your plate with fruits and vegetables. ? Up to 6-8 servings of whole grains each day. ? Up to 2 servings of lean meat, poultry, or fish each day. One serving of meat is equal to 3 oz. This is about the same size as a deck of cards. ? 2 servings of low-fat dairy each day. ? Heart-healthy fats. Healthy fats called omega-3 fatty acids are found in foods such as flaxseed and cold-water fish like sardines, salmon, and mackerel.  Aim to eat 25-35 g (grams) of fiber a day. Foods that are high in fiber include apples, broccoli, carrots, beans, peas, and whole grains.  Do not add salt or condiments that contain salt (such as soy sauce) to foods before eating.  When eating at a restaurant, ask that your food be prepared with less salt or no salt, if possible.  Try to eat 2 or more vegetarian meals each week.  Eat more home-cooked food and eat less restaurant, buffet, and fast food. General information  Do not eat more than 2,300 mg of salt (sodium) a day. The amount of sodium that is recommended for you may be lower, depending on your condition.  Maintain a healthy body weight as directed. Ask your health care provider what a healthy weight is   for you. ? Check your weight every day. ? Work with your health care provider and dietitian to make a plan that is right for you to lose weight or maintain your current weight.  Limit how much fluid you drink. Ask your health care provider or dietitian how much fluid you can have each day.  Limit or avoid alcohol as told by your health care provider or dietitian. Recommended foods The items listed may not be a complete list. Talk with your dietitian about what  dietary choices are best for you. Fruits All fresh, frozen, and canned fruits. Dried fruits, such as raisins, prunes, and cranberries. Vegetables All fresh vegetables. Vegetables that are frozen without sauce or added salt. Low-sodium or sodium-free canned vegetables. Grains Bread with less than 80 mg of sodium per slice. Whole-wheat pasta, quinoa, and brown rice. Oats and oatmeal. Barley. Millet. Grits and cream of wheat. Whole-grain and whole-wheat cold cereal. Meats and other protein foods Lean cuts of meat. Skinless chicken and turkey. Fish with high omega-3 fatty acids, such as salmon, sardines, and other cold-water fishes. Eggs. Dried beans, peas, and edamame. Unsalted nuts and nut butters. Dairy Low-fat or nonfat (skim) milk and dried milk. Rice milk, soy milk, and almond milk. Low-fat or nonfat yogurt. Small amounts of reduced-sodium block cheese. Low-sodium cottage cheese. Fats and oils Olive, canola, soybean, flaxseed, or sunflower oil. Avocado. Sweets and desserts Apple sauce. Granola bars. Sugar-free pudding and gelatin. Frozen fruit bars. Seasoning and other foods Fresh and dried herbs. Lemon or lime juice. Vinegar. Low-sodium ketchup. Salt-free marinades, salad dressings, sauces, and seasonings. The items listed above may not be a complete list of foods and beverages you can eat. Contact a dietitian for more information. Foods to avoid The items listed may not be a complete list. Talk with your dietitian about what dietary choices are best for you. Fruits Fruits that are dried with sodium-containing preservatives. Vegetables Canned vegetables. Frozen vegetables with sauce or seasonings. Creamed vegetables. French fries. Onion rings. Pickled vegetables and sauerkraut. Grains Bread with more than 80 mg of sodium per slice. Hot or cold cereal with more than 140 mg sodium per serving. Salted pretzels and crackers. Pre-packaged breadcrumbs. Bagels, croissants, and biscuits. Meats  and other protein foods Ribs and chicken wings. Bacon, ham, pepperoni, bologna, salami, and packaged luncheon meats. Hot dogs, bratwurst, and sausage. Canned meat. Smoked meat and fish. Salted nuts and seeds. Dairy Whole milk, half-and-half, and cream. Buttermilk. Processed cheese, cheese spreads, and cheese curds. Regular cottage cheese. Feta cheese. Shredded cheese. String cheese. Fats and oils Butter, lard, shortening, ghee, and bacon fat. Canned and packaged gravies. Seasoning and other foods Onion salt, garlic salt, table salt, and sea salt. Marinades. Regular salad dressings. Relishes, pickles, and olives. Meat flavorings and tenderizers, and bouillon cubes. Horseradish, ketchup, and mustard. Worcestershire sauce. Teriyaki sauce, soy sauce (including reduced sodium). Hot sauce and Tabasco sauce. Steak sauce, fish sauce, oyster sauce, and cocktail sauce. Taco seasonings. Barbecue sauce. Tartar sauce. The items listed above may not be a complete list of foods and beverages you should avoid. Contact a dietitian for more information. Summary  A heart failure eating plan includes changes that limit your intake of sodium and unhealthy fat, and it may help you lose weight or maintain a healthy weight. Your health care provider may also recommend limiting how much fluid you drink.  Most people with heart failure should eat no more than 2,300 mg of salt (sodium) a day. The amount of sodium   that is recommended for you may be lower, depending on your condition.  Contact your health care provider or dietitian before making any major changes to your diet. This information is not intended to replace advice given to you by your health care provider. Make sure you discuss any questions you have with your health care provider. Document Released: 04/05/2017 Document Revised: 01/15/2019 Document Reviewed: 04/05/2017 Elsevier Patient Education  [place]. Heart Failure, Diagnosis  Heart failure means  that your heart is not able to pump blood in the right way. This makes it hard for your body to work well. Heart failure is usually a long-term (chronic) condition. You must take good care of yourself and follow your treatment plan from your doctor. What are the causes? This condition may be caused by:  High blood pressure.  Build up of cholesterol and fat in the arteries.  Heart attack. This injures the heart muscle.  Heart valves that do not open and close properly.  Damage of the heart muscle. This is also called cardiomyopathy.  Lung disease.  Abnormal heart rhythms. What increases the risk? The risk of heart failure goes up as a person ages. This condition is also more likely to develop in people who:  Are overweight.  Are male.  Smoke or chew tobacco.  Abuse alcohol or illegal drugs.  Have taken medicines that can damage the heart.  Have diabetes.  Have abnormal heart rhythms.  Have thyroid problems.  Have low blood counts (anemia). What are the signs or symptoms? Symptoms of this condition include:  Shortness of breath.  Coughing.  Swelling of the feet, ankles, legs, or belly.  Losing weight for no reason.  Trouble breathing.  Waking from sleep because of the need to sit up and get more air.  Rapid heartbeat.  Being very tired.  Feeling dizzy, or feeling like you may pass out (faint).  Having no desire to eat.  Feeling like you may vomit (nauseous).  Peeing (urinating) more at night.  Feeling confused. How is this treated?     This condition may be treated with:  Medicines. These can be given to treat blood pressure and to make the heart muscles stronger.  Changes in your daily life. These may include eating a healthy diet, staying at a healthy body weight, quitting tobacco and illegal drug use, or doing exercises.  Surgery. Surgery can be done to open blocked valves, or to put devices in the heart, such as pacemakers.  A donor heart  (heart transplant). You will receive a healthy heart from a donor. Follow these instructions at home:  Treat other conditions as told by your doctor. These may include high blood pressure, diabetes, thyroid disease, or abnormal heart rhythms.  Learn as much as you can about heart failure.  Get support as you need it.  Keep all follow-up visits as told by your doctor. This is important. Summary  Heart failure means that your heart is not able to pump blood in the right way.  This condition is caused by high blood pressure, heart attack, or damage of the heart muscle.  Symptoms of this condition include shortness of breath and swelling of the feet, ankles, legs, or belly. You may also feel very tired or feel like you may vomit.  You may be treated with medicines, surgery, or changes in your daily life.  Treat other health conditions as told by your doctor. This information is not intended to replace advice given to you by  your health care provider. Make sure you discuss any questions you have with your health care provider. Document Released: 08/28/2008 Document Revised: 02/06/2019 Document Reviewed: 02/06/2019 Elsevier Patient Education  [place]. Coronary Artery Disease, Male Coronary artery disease (CAD) is a condition in which the arteries that lead to the heart (coronary arteries) become narrow or blocked. The narrowing or blockage can lead to decreased blood flow to the heart. Prolonged reduced blood flow can cause a heart attack (myocardial infarction or MI). This condition may also be called coronary heart disease. Because CAD is the leading cause of death in men, it is important to understand what causes this condition and how it is treated. What are the causes? CAD is most often caused by atherosclerosis. This is the buildup of fat and cholesterol (plaque) on the inside of the arteries. Over time, the plaque may narrow or block the artery, reducing blood flow to the  heart. Plaque can also become weak and break off within a coronary artery and cause a sudden blockage. Other less common causes of CAD include:  A blood clot or a piece of a blood clot or other substance that blocks the flow of blood in a coronary artery (embolism).  A tearing of the artery (spontaneous coronary artery dissection).  An enlargement of an artery (aneurysm).  Inflammation (vasculitis) in the artery wall. What increases the risk? The following factors may make you more likely to develop this condition:  Age. Men over age 84 are at a greater risk of CAD.  Family history of CAD.  Gender. Men often develop CAD earlier in life than women.  High blood pressure (hypertension).  Diabetes.  High cholesterol levels.  Tobacco use.  Excessive alcohol use.  Lack of exercise.  A diet high in saturated and trans fats, such as fried food and processed meat. Other possible risk factors include:  High stress levels.  Depression.  Obesity.  Sleep apnea. What are the signs or symptoms? Many people do not have any symptoms during the early stages of CAD. As the condition progresses, symptoms may include:  Chest pain (angina). The pain can: ? Feel like crushing or squeezing, or like a tightness, pressure, fullness, or heaviness in the chest. ? Last more than a few minutes or can stop and recur. The pain tends to get worse with exercise or stress and to fade with rest.  Pain in the arms, neck, jaw, ear, or back.  Unexplained heartburn or indigestion.  Shortness of breath.  Nausea or vomiting.  Sudden light-headedness.  Sudden cold sweats.  Fluttering or fast heartbeat (palpitations). How is this diagnosed? This condition is diagnosed based on:  Your family and medical history.  A physical exam.  Tests, including: ? A test to check the electrical signals in your heart (electrocardiogram). ? Exercise stress test. This looks for signs of blockage when the  heart is stressed with exercise, such as running on a treadmill. ? Pharmacologic stress test. This test looks for signs of blockage when the heart is being stressed with a medicine. ? Blood tests. ? Coronary angiogram. This is a procedure to look at the coronary arteries to see if there is any blockage. During this test, a dye is injected into your arteries so they appear on an X-ray. ? Coronary artery CT scan. This CT scan helps detect calcium deposits in your coronary arteries. Calcium deposits are an indicator of CAD. ? A test that uses sound waves to take a picture of your  heart (echocardiogram). ? Chest X-ray. How is this treated? This condition may be treated by:  Healthy lifestyle changes to reduce risk factors.  Medicines such as: ? Antiplatelet medicines and blood-thinning medicines, such as aspirin. These help to prevent blood clots. ? Nitroglycerin. ? Blood pressure medicines. ? Cholesterol-lowering medicine.  Coronary angioplasty and stenting. During this procedure, a thin, flexible tube is inserted through a blood vessel and into a blocked artery. A balloon or similar device on the end of the tube is inflated to open up the artery. In some cases, a small, mesh tube (stent) is inserted into the artery to keep it open.  Coronary artery bypass surgery. During this surgery, veins or arteries from other parts of the body are used to create a bypass around the blockage and allow blood to reach your heart. Follow these instructions at home: Medicines  Take over-the-counter and prescription medicines only as told by your health care provider.  Do not take the following medicines unless your health care provider approves: ? NSAIDs, such as ibuprofen, naproxen, or celecoxib. ? Vitamin supplements that contain vitamin A, vitamin E, or both. Lifestyle  Follow an exercise program approved by your health care provider. Aim for 150 minutes of moderate exercise or 75 minutes of vigorous  exercise each week.  Maintain a healthy weight or lose weight as approved by your health care provider.  Learn to manage stress or try to limit your stress. Ask your health care provider for suggestions if you need help.  Get screened for depression and seek treatment, if needed.  Do not use any products that contain nicotine or tobacco, such as cigarettes, e-cigarettes, and chewing tobacco. If you need help quitting, ask your health care provider.  Do not use illegal drugs. Eating and drinking   Follow a heart-healthy diet. A dietitian can help educate you about healthy food options and changes. In general, eat plenty of fruits and vegetables, lean meats, and whole grains.  Avoid foods high in: ? Sugar. ? Salt (sodium). ? Saturated fat, such as processed or fatty meat. ? Trans fat, such as fried foods.  Use healthy cooking methods such as roasting, grilling, broiling, baking, poaching, steaming, or stir-frying.  Do not drink alcohol if your health care provider tells you not to drink.  If you drink alcohol: ? Limit how much you have to 0-2 drinks per day. ? Be aware of how much alcohol is in your drink. In the [place] one drink equals one 12 oz bottle of beer (355 mL), one 5 oz glass of wine (148 mL), or one 1 oz glass of hard liquor (44 mL). General instructions  Manage any other health conditions, such as hypertension and diabetes. These conditions affect your heart.  Your health care provider may ask you to monitor your blood pressure. Ideally, your blood pressure should be below 130/80.  Keep all follow-up visits as told by your health care provider. This is important. Get help right away if:  You have pain in your chest, neck, ear, arm, jaw, stomach, or back that: ? Lasts more than a few minutes. ? Is recurring. ? Is not relieved by taking medicine under your tongue (sublingual nitroglycerin).  You have profuse sweating without cause.  You have  unexplained: ? Heartburn or indigestion. ? Shortness of breath or difficulty breathing. ? Fluttering or fast heartbeat (palpitations). ? Nausea or vomiting. ? Fatigue. ? Feelings of nervousness or anxiety. ? Weakness. ? Diarrhea.  You have sudden light-headedness or dizziness.  You faint.  You feel like hurting yourself or think about taking your own life. These symptoms may represent a serious problem that is an emergency. Do not wait to see if the symptoms will go away. Get medical help right away. Call your local emergency services (911 in the [place] Do not drive yourself to the hospital. Summary  Coronary artery disease (CAD) is a condition in which the arteries that lead to the heart (coronary arteries) become narrow or blocked. The narrowing or blockage can lead to a heart attack.  Many people do not have any symptoms during the early stages of CAD.  CAD can be treated with lifestyle changes, medicines, surgery, or a combination of these treatments. This information is not intended to replace advice given to you by your health care provider. Make sure you discuss any questions you have with your health care provider. Document Released: 06/16/2014 Document Revised: 08/08/2018 Document Reviewed: 07/29/2018 Elsevier Patient Education  2020 [place]. Gangrene Gangrene is a medical condition that is caused by a lack of blood supply to an area of your body. Oxygen travels through blood, so less blood means less oxygen. Without oxygen, body tissue will start to die. Gangrene can affect many parts of the body. It is most common on the skin (external gangrene) and on your arms and legs, but it can also affect internal body parts (internal gangrene). Gangrene requires emergency medical treatment. What are the causes? Any condition that cuts off blood supply can cause gangrene. Common causes include:  Injuries.  Blood vessel diseases.  Diabetes.  Infections. What increases the  risk? You may be at increased risk for gangrene if you have recently had surgery, or if you have:  A serious injury.  Diabetes.  A weak disease-fighting system (immune system).  Narrowing of your arteries due to plaque buildup (arteriosclerosis).  An immune system disease that causes your arteries to tighten (Raynaud's disease).  A history of IV drug use. What are the signs or symptoms? Symptoms depend on which part of your body is affected. If you have external gangrene, you may have:  Severe pain, followed by loss of feeling.  Redness and swelling.  Crackling sounds when you press on a swollen area of skin.  A wound with bad-smelling drainage.  Changes in the color of your skin. It may turn red, blue, black, or white.  Fever and chills. If you have internal gangrene, you may have:  Fever and chills.  Confusion.  Dizziness.  Severe pain.  Rapid heartbeat and breathing.  Loss of appetite.  Nausea or vomiting. How is this diagnosed? This condition may be diagnosed based on:  Your symptoms and medical history.  A physical exam.  X-rays of your blood vessels after injecting dye into your blood vessels (arteriogram).  Blood tests to check for infection.  Imaging tests such as a CT scan or MRI.  Testing a sample of wound drainage for bacteria (culture).  Testing a tissue sample for cell death (biopsy).  Surgery to look for gangrene inside your body. How is this treated? Treatment for gangrene depends on the cause and the area of your body that is affected. Gangrene is usually treated in the hospital. It is very important to start treatment early, before gangrene spreads. Treatment may include:  High doses of IV antibiotic medicine, for gangrene caused by infection.  Surgery. Surgical options may include: ? Debridement. This is surgery to remove dead tissue. You may need to have debridement several times. ?  Bypass or angioplasty. These surgeries improve  blood flow to an affected area. ? Amputation. This is surgery to remove a body part. This may be necessary in very severe cases.  Oxygen therapy. This involves treatment in a chamber designed to provide high levels of oxygen (hyperbaric oxygen therapy). It can improve the oxygen supply to an affected area.  Supportive care to keep the rest of your body healthy. This may include: ? IV fluids and nutrients. ? Pain medicines. ? Blood thinners to prevent blood clots. Follow these instructions at home: Skin and wound care  Clean any cuts or scratches with a germ-killing (antiseptic) solution. Your health care provider may recommend certain over-the-counter antiseptics.  Cover any open wounds with a clean bandage. Change the bandage as often as needed to keep the wound clean. Make sure to wash your hands before touching your bandage.  Check wounds every day for signs of infection, such as: ? Redness, swelling, or pain. ? Fluid or blood. ? Warmth. ? Pus or a bad smell.  If you have diabetes or another blood vessel disease, check your feet every day for signs of injury or infection. You may be at higher risk for gangrene. Lifestyle  Do not use any products that contain nicotine or tobacco, such as cigarettes and e-cigarettes. These can delay wound healing. If you need help quitting, ask your health care provider.  Limit alcohol intake to no more than 1 drink a day for women (no drinks if you are pregnant) and 2 drinks a day for men. One drink equals 12 oz of beer, 5 oz of wine, or 1 oz of hard liquor.  Eat a healthy diet and maintain a healthy weight.  Get some exercise on most days of the week. Ask your health care provider what forms of exercise may be best for you. General instructions  Keep all follow-up visits as told by your health care provider. This is important. Medicines  Take over-the-counter and prescription medicines only as told by your health care provider.  If you were  prescribed an antibiotic medicine, take it as told by your health care provider. Do not stop taking the antibiotic even if you start to feel better.  If you are taking blood thinners: ? Talk with your health care provider before you take any medicines that contain aspirin or NSAIDs. These medicines increase your risk for dangerous bleeding. ? Take your medicine exactly as told, at the same time every day. ? Avoid activities that could cause injury or bruising, and follow instructions about how to prevent falls. ? Wear a medical alert bracelet or carry a card that lists what medicines you take. Contact a health care provider if:  You have a wound or sore that is not healing. Get help right away if:  You have a wound or sore that shows signs of infection.  An area of your skin turns white, red, blue, or black.  You have rapidly worsening pain at the site of a skin infection or wound.  You experience numbness at the site of a skin infection or wound.  You have unexplained: ? Fever. ? Chills. ? Confusion. ? Fainting. Summary  Gangrene is a medical condition that is caused by a lack of blood supply to an area of your body.  It is most common on the skin (external gangrene), but it can also affect internal body parts (internal gangrene).  Injuries and blood vessel diseases are common causes of gangrene.  Gangrene requires emergency  medical treatment. Treatment may include hospitalization, antibiotics, or surgery. This information is not intended to replace advice given to you by your health care provider. Make sure you discuss any questions you have with your health care provider. Document Released: 09/20/2004 Document Revised: 11/01/2017 Document Reviewed: 08/27/2017 Elsevier Patient Education  2020 [employer].

## 2019-07-10 NOTE — Progress Notes (Addendum)
Progress Note  Patient Name: Joseph Hoffman Date of Encounter: 07/10/2019  Primary Cardiologist: Shelva Majestic, MD   Subjective   No chest pain.  Feels well.  No foot pain.  Inpatient Medications    Scheduled Meds: . cholecalciferol  1,000 Units Oral Daily  . digoxin  0.125 mg Oral QHS  . escitalopram  10 mg Oral QPM  . ezetimibe  10 mg Oral Daily  . feeding supplement (GLUCERNA SHAKE)  237 mL Oral BID BM  . fenofibrate  160 mg Oral Daily  . Icosapent Ethyl  2 g Oral BID  . insulin aspart  0-20 Units Subcutaneous TID WC  . insulin aspart  0-5 Units Subcutaneous QHS  . insulin aspart  20 Units Subcutaneous TID WC  . insulin glargine  32 Units Subcutaneous BID  . metoprolol succinate  25 mg Oral BID  . mometasone-formoterol  2 puff Inhalation BID  . multivitamin  1 tablet Oral Daily  . Ensure Max Protein  11 oz Oral Daily  . saccharomyces boulardii  250 mg Oral Q M,W,F  . sacubitril-valsartan  1 tablet Oral BID  . senna-docusate  2 tablet Oral BID  . sodium chloride flush  3 mL Intravenous Q12H  . sodium chloride flush  3 mL Intravenous Q12H  . torsemide  20 mg Oral QPM  . torsemide  40 mg Oral q morning - 10a  . vitamin C  1,000 mg Oral QPM  . Warfarin - Pharmacist Dosing Inpatient   Does not apply q1800   Continuous Infusions: . sodium chloride 50 mL/hr at 06/30/19 0635  . sodium chloride 250 mL (07/01/19 0634)  . sodium chloride    . heparin 2,250 Units/hr (07/10/19 0741)   PRN Meds: sodium chloride, sodium chloride, acetaminophen, albuterol, guaiFENesin-dextromethorphan, hydrALAZINE, labetalol, loperamide, nitroGLYCERIN, ondansetron (ZOFRAN) IV, oxyCODONE, sodium chloride flush, sodium chloride flush, sodium chloride flush   Vital Signs    Vitals:   07/09/19 1037 07/09/19 1918 07/10/19 0526 07/10/19 0733  BP: 116/66  (!) 122/55   Pulse: 62  76   Resp: 16  15   Temp:   98.2 F (36.8 C)   TempSrc:   Oral   SpO2: 99% 99% 93% 96%  Weight:   117.2 kg    Height:        Intake/Output Summary (Last 24 hours) at 07/10/2019 1011 Last data filed at 07/10/2019 0900 Gross per 24 hour  Intake 3235.16 ml  Output 2350 ml  Net 885.16 ml   Last 3 Weights 07/10/2019 07/09/2019 07/08/2019  Weight (lbs) 258 lb 6.4 oz 253 lb 9.6 oz 253 lb 4.8 oz  Weight (kg) 117.209 kg 115.032 kg 114.896 kg      Telemetry    NSR - Personally Reviewed  ECG      Physical Exam   GEN: No acute distress.   Neck: No JVD Cardiac: RRR, crisp S2 click  Respiratory: Clear to auscultation bilaterally. GI: Soft, nontender, non-distended  MS: No edema; No deformity. Bandaged foot Neuro:  Nonfocal  Psych: Normal affect   Labs    High Sensitivity Troponin:   Recent Labs  Lab 06/26/19 1315 06/26/19 1446  TROPONINIHS 96* 99*      Cardiac EnzymesNo results for input(s): TROPONINI in the last 168 hours. No results for input(s): TROPIPOC in the last 168 hours.   Chemistry Recent Labs  Lab 07/08/19 0745 07/09/19 0421 07/10/19 0217  NA 136 135 130*  K 4.1 4.1 4.4  CL 100 99 96*  CO2 24 25 25   GLUCOSE 162* 169* 117*  BUN 34* 38* 43*  CREATININE 1.29* 1.44* 1.60*  CALCIUM 9.1 8.9 8.7*  GFRNONAA 56* 49* 43*  GFRAA >60 57* 50*  ANIONGAP 12 11 9      Hematology Recent Labs  Lab 07/08/19 0745 07/09/19 0421 07/10/19 0217  WBC 9.6 9.3 9.9  RBC 4.73 4.47 4.33  HGB 12.3* 11.6* 11.4*  HCT 39.5 36.7* 35.6*  MCV 83.5 82.1 82.2  MCH 26.0 26.0 26.3  MCHC 31.1 31.6 32.0  RDW 18.1* 17.9* 17.9*  PLT 242 221 189    BNPNo results for input(s): BNP, PROBNP in the last 168 hours.   DDimer No results for input(s): DDIMER in the last 168 hours.   Radiology    No results found.  Cardiac Studies   Prior cath from 06/2019 reviewed  Patient Profile     68 y.o. male with AVR, valve dysfunction. CAD  Assessment & Plan    1) Needs CABG /AVR.  He is INR increased somewhat.  OK to send home today on Lovenox bridge.  Recheck BMet and INR on Monday.  Plan for surgery  once foot ulcer has healed.   Explained to patient.  He is glad to go home.    Diuretics as outlined for systolic heart failure.  His Cr is trending up.  Stay well hydrated and recheck on Monday.  [facility] will sign off.   Medication Recommendations:  Lovenox bridge until Monday Other recommendations (labs, testing, etc):  BMet, INR on Monday Follow up as an outpatient:  Will schedule  For questions or updates, please contact [place] Please consult [email or web address] for contact info under        Signed, Larae Grooms, MD  07/10/2019, 10:11 AM

## 2019-07-10 NOTE — Progress Notes (Signed)
[facility] [place]  Subjective  - No complaints.  Hopefully home today.   Objective 127/64 65 98.2 F (36.8 C) (Oral) 15 96%  Intake/Output Summary (Last 24 hours) at 07/10/2019 1159 Last data filed at 07/10/2019 1100 Gross per 24 hour  Intake 2716.39 ml  Output 2550 ml  Net 166.39 ml    Left great toe amp c/d/i - skin edges viable - continues to look good  Laboratory Lab Results: Recent Labs    07/09/19 0421 07/10/19 0217  WBC 9.3 9.9  HGB 11.6* 11.4*  HCT 36.7* 35.6*  PLT 221 189   BMET Recent Labs    07/09/19 0421 07/10/19 0217  NA 135 130*  K 4.1 4.4  CL 99 96*  CO2 25 25  GLUCOSE 169* 117*  BUN 38* 43*  CREATININE 1.44* 1.60*  CALCIUM 8.9 8.7*    COAG Lab Results  Component Value Date   INR 1.3 (H) 07/10/2019   INR 1.1 07/09/2019   INR 1.1 07/09/2019   No results found for: PTT  Assessment/Planning: 68 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  Severe tibial disease, peroneal runoff.  Left great toe amp continues to look good, skins margins viable, dressing changed today.  Can be discharged from our standpoint and have arranged 3 week follow-up for suture removal/wound check.  He knows to call office if any concerns sooner.  Marty Heck 07/10/2019 11:59 AM --

## 2019-07-10 NOTE — Progress Notes (Signed)
ANTICOAGULATION CONSULT NOTE - Follow Up Consult  Pharmacy Consult for heparin Indication: AVR/Afib  Labs: Recent Labs    07/08/19 0745 07/09/19 0421 07/09/19 0748 07/09/19 1626 07/10/19 0217  HGB 12.3* 11.6*  --   --  11.4*  HCT 39.5 36.7*  --   --  35.6*  PLT 242 221  --   --  189  LABPROT 16.6* 14.4 14.3  --  16.0*  INR 1.4* 1.1 1.1  --  1.3*  HEPARINUNFRC 0.43 <0.10* <0.10* 0.12* 0.53  CREATININE 1.29* 1.44*  --   --  1.60*    Assessment/Plan:  68yo male therapeutic on heparin after rate change. Will continue gtt at current rate and confirm stable with additional level.   Wynona Neat, PharmD, BCPS  07/10/2019,3:50 AM

## 2019-07-10 NOTE — TOC Transition Note (Signed)
Transition of Care [facility]) - CM/SW Discharge Note   Patient Details  Name: Joseph Hoffman MRN: [id number] Date of Birth: [birth year]  [facility] CM/SW Contact:  Marilu Favre, RN Phone Number: 07/10/2019, 1:05 PM   Clinical Narrative:     Home from home with wife. Confirmed face sheet information.   Will need wound care order clarified for home health RN, bedside nurse working on wound care orders. Patient and wife will need to have education on Lovenox injections and wound care.   Called patient's pharmacy Lovenox cost is $60. Patient states he can afford. Provided patient with  30 day free card for [facility] . Patient voiced understanding.  Final next level of care: [place] Barriers to Discharge: Continued Medical Work up   Patient Goals and CMS Choice Patient states their goals for this hospitalization and ongoing recovery are:: to go home CMS Medicare.gov Compare Post Acute Care list provided to:: Patient Choice offered to / list presented to : Patient  Discharge Placement                       Discharge Plan and Services   Discharge Planning Services: CM Consult Post Acute Care Choice: Home Health          DME Arranged: [place] rolling DME Agency: [employer] Date DME Agency Contacted: 07/08/19 Time DME Agency Contacted: [phone] Representative spoke with at DME Agency: Already in room HH Arranged: RN, PT [place] Agency: [place] Date [place]: 07/10/19 Time [place]: 1305 Representative spoke with at [place]: [place] ([place]) Interventions     Readmission Risk Interventions No flowsheet data found.

## 2019-07-12 DIAGNOSIS — I2510 Atherosclerotic heart disease of native coronary artery without angina pectoris: Secondary | ICD-10-CM | POA: Diagnosis not present

## 2019-07-12 DIAGNOSIS — N179 Acute kidney failure, unspecified: Secondary | ICD-10-CM | POA: Diagnosis not present

## 2019-07-12 DIAGNOSIS — I5043 Acute on chronic combined systolic (congestive) and diastolic (congestive) heart failure: Secondary | ICD-10-CM | POA: Diagnosis not present

## 2019-07-12 DIAGNOSIS — E1152 Type 2 diabetes mellitus with diabetic peripheral angiopathy with gangrene: Secondary | ICD-10-CM | POA: Diagnosis not present

## 2019-07-12 DIAGNOSIS — E785 Hyperlipidemia, unspecified: Secondary | ICD-10-CM | POA: Diagnosis not present

## 2019-07-12 DIAGNOSIS — Z4801 Encounter for change or removal of surgical wound dressing: Secondary | ICD-10-CM | POA: Diagnosis not present

## 2019-07-12 DIAGNOSIS — S301XXD Contusion of abdominal wall, subsequent encounter: Secondary | ICD-10-CM | POA: Diagnosis not present

## 2019-07-12 DIAGNOSIS — S40022D Contusion of left upper arm, subsequent encounter: Secondary | ICD-10-CM | POA: Diagnosis not present

## 2019-07-12 DIAGNOSIS — Z4781 Encounter for orthopedic aftercare following surgical amputation: Secondary | ICD-10-CM | POA: Diagnosis not present

## 2019-07-12 DIAGNOSIS — G4733 Obstructive sleep apnea (adult) (pediatric): Secondary | ICD-10-CM | POA: Diagnosis not present

## 2019-07-12 DIAGNOSIS — N183 Chronic kidney disease, stage 3 (moderate): Secondary | ICD-10-CM | POA: Diagnosis not present

## 2019-07-12 DIAGNOSIS — J9621 Acute and chronic respiratory failure with hypoxia: Secondary | ICD-10-CM | POA: Diagnosis not present

## 2019-07-12 DIAGNOSIS — I4821 Permanent atrial fibrillation: Secondary | ICD-10-CM | POA: Diagnosis not present

## 2019-07-12 DIAGNOSIS — J45909 Unspecified asthma, uncomplicated: Secondary | ICD-10-CM | POA: Diagnosis not present

## 2019-07-12 DIAGNOSIS — T8209XD Other mechanical complication of heart valve prosthesis, subsequent encounter: Secondary | ICD-10-CM | POA: Diagnosis not present

## 2019-07-12 DIAGNOSIS — E1122 Type 2 diabetes mellitus with diabetic chronic kidney disease: Secondary | ICD-10-CM | POA: Diagnosis not present

## 2019-07-12 DIAGNOSIS — Z89412 Acquired absence of left great toe: Secondary | ICD-10-CM | POA: Diagnosis not present

## 2019-07-12 DIAGNOSIS — F329 Major depressive disorder, single episode, unspecified: Secondary | ICD-10-CM | POA: Diagnosis not present

## 2019-07-12 DIAGNOSIS — I70209 Unspecified atherosclerosis of native arteries of extremities, unspecified extremity: Secondary | ICD-10-CM | POA: Diagnosis not present

## 2019-07-12 DIAGNOSIS — E1165 Type 2 diabetes mellitus with hyperglycemia: Secondary | ICD-10-CM | POA: Diagnosis not present

## 2019-07-12 DIAGNOSIS — I428 Other cardiomyopathies: Secondary | ICD-10-CM | POA: Diagnosis not present

## 2019-07-12 DIAGNOSIS — I130 Hypertensive heart and chronic kidney disease with heart failure and stage 1 through stage 4 chronic kidney disease, or unspecified chronic kidney disease: Secondary | ICD-10-CM | POA: Diagnosis not present

## 2019-07-12 DIAGNOSIS — S40021D Contusion of right upper arm, subsequent encounter: Secondary | ICD-10-CM | POA: Diagnosis not present

## 2019-07-12 DIAGNOSIS — I700 Atherosclerosis of aorta: Secondary | ICD-10-CM | POA: Diagnosis not present

## 2019-07-12 DIAGNOSIS — F419 Anxiety disorder, unspecified: Secondary | ICD-10-CM | POA: Diagnosis not present

## 2019-07-13 ENCOUNTER — Ambulatory Visit (INDEPENDENT_AMBULATORY_CARE_PROVIDER_SITE_OTHER): Payer: Medicare Other | Admitting: Pharmacist

## 2019-07-13 ENCOUNTER — Other Ambulatory Visit: Payer: Self-pay

## 2019-07-13 DIAGNOSIS — Z7901 Long term (current) use of anticoagulants: Secondary | ICD-10-CM | POA: Diagnosis not present

## 2019-07-13 DIAGNOSIS — Z952 Presence of prosthetic heart valve: Secondary | ICD-10-CM

## 2019-07-13 LAB — POCT INR: INR: 2.7 (ref 2.0–3.0)

## 2019-07-13 NOTE — Patient Instructions (Signed)
*  STOP taking lovenox* Continue stable dose of warfarin 10mg  daily except for 12.5mg  on Tuesdays, Thursdays, and Saturdays. Repeat INR in 2 weeks

## 2019-07-14 ENCOUNTER — Telehealth: Payer: Self-pay | Admitting: Vascular Surgery

## 2019-07-14 DIAGNOSIS — E1152 Type 2 diabetes mellitus with diabetic peripheral angiopathy with gangrene: Secondary | ICD-10-CM | POA: Diagnosis not present

## 2019-07-14 DIAGNOSIS — Z4781 Encounter for orthopedic aftercare following surgical amputation: Secondary | ICD-10-CM | POA: Diagnosis not present

## 2019-07-14 DIAGNOSIS — I5043 Acute on chronic combined systolic (congestive) and diastolic (congestive) heart failure: Secondary | ICD-10-CM | POA: Diagnosis not present

## 2019-07-14 DIAGNOSIS — I70209 Unspecified atherosclerosis of native arteries of extremities, unspecified extremity: Secondary | ICD-10-CM | POA: Diagnosis not present

## 2019-07-14 DIAGNOSIS — Z4801 Encounter for change or removal of surgical wound dressing: Secondary | ICD-10-CM | POA: Diagnosis not present

## 2019-07-14 DIAGNOSIS — I130 Hypertensive heart and chronic kidney disease with heart failure and stage 1 through stage 4 chronic kidney disease, or unspecified chronic kidney disease: Secondary | ICD-10-CM | POA: Diagnosis not present

## 2019-07-14 NOTE — Telephone Encounter (Signed)
-----   Message from Marty Heck, MD sent at 07/13/2019  4:39 PM EDT ----- Regarding: RE: Wound Care Contact: [phone] Cleaning it was saline wound be fine.  Then dry dressing  Thanks  Gerald Stabs ----- Message ----- From: Kaleen Mask, LPN Sent: [phone]   4:30 PM EDT To: Marty Heck, MD Subject: Wound Care                                     Hey Dr. Carlis Abbott.  This patient is refusing to let the nurses at [facility] cleanse his toe amp site with normal saline, then apply dry dressing.  He said he was told to let it stay dry? Please advise.  Thank you,  Thurston Hole., LPN

## 2019-07-14 NOTE — Telephone Encounter (Signed)
Home Health Nurse aware and will contact the patient.  Thurston Hole., LPN

## 2019-07-15 ENCOUNTER — Other Ambulatory Visit: Payer: Self-pay | Admitting: *Deleted

## 2019-07-15 ENCOUNTER — Telehealth: Payer: Self-pay | Admitting: Cardiovascular Disease

## 2019-07-15 NOTE — Patient Outreach (Signed)
[place] [facility]) Care Management  07/15/2019  Joseph Hoffman 1951-05-02 [id number]    Telephone Assessment-HF Primary to complete transition of care  RN spoke with pt and received a update on pt's ongoing management of care. Pt reports he is recovering well with no acute issues however involved with [facility] for PT and nursing for dressing changes. States RN visits once weekly and PT twice weekly with no problems encountered. Pt has already spoke with his provider's office and a few medication were changed. Pt does not need education on the recent medication changes (verbalized understanding).  Plan of care discussed with some goals met and others updated according to pt's progress. Pt remains in the GREEN zone and denies any swelling with no precipitating symptoms. Will reiterate on the HF zones and confirmed pt continues to weight daily and report any abnormal fluid retention.  Pt very active with self management of care at this time due to pending surgery scheduled next month for a mechanical valve replacement and double bypass. Pt states this should occur around the second week in September. Goals of care updated and RN will follow up after pt's surgery in September concerning his ongoing management of care. Note pt also reports his diabetes with good readings CBG <200 and this Am at 120. No reported issues with his diabetes. A1C was 8.7 6/10 and pt is pending updated A1c on 8/14.  THN CM Care Plan Problem One     Most Recent Value  Care Plan Problem One  Hospital prevention and knowledge deficit related to CHF exacerbation  Role Documenting the Problem One  Care Management Coordinator  Care Plan for Problem One  Active  THN Long Term Goal   Pt will not a hospitalization in the next 90 days with increase knowledge based on CHF.  THN Long Term Goal Start Date  05/22/19  Interventions for Problem One Long Term Goal  Pt will recent hospitalization however due to amputation of LLE  (cellulitis). Will reiterate on ongoing management of care concerninghis heart failure pending upcoming valve replacement and double bypass surgery  THN CM Short Term Goal #1   Pt will weight daily and document all weights over the next 30 days.  THN CM Short Term Goal #1 Start Date  05/22/19  Interventions for Short Term Goal #1  Will extend due to recent hospitalization to allow pt ongoing adherence with monitoring his HF. Will continue to encoudraged adherence with daily weights and when to contact his provider with acute symtoms.  THN CM Short Term Goal #2   Adherence with post-op medical appointments over the next 30 days.  THN CM Short Term Goal #2 Start Date  05/22/19  Interventions for Short Term Goal #2  Will extend due to recent hospitalization to allow ongoing adherence and offer another sources if needed for ongoing compliance. Will stress the importance of attendance due to upcoming heart surgery.      Raina Mina, RN Care Management Coordinator [place] Office [phone]

## 2019-07-15 NOTE — Telephone Encounter (Signed)
New Message   Mordecai Rasmussen from :[facility] calling to find out if Dr. Claiborne Billings will sign off on patient's home healthcare orders from hospital discharge on 07/10/19.  Orders for nursing and physical therapy.

## 2019-07-16 DIAGNOSIS — Z4781 Encounter for orthopedic aftercare following surgical amputation: Secondary | ICD-10-CM | POA: Diagnosis not present

## 2019-07-16 DIAGNOSIS — I70209 Unspecified atherosclerosis of native arteries of extremities, unspecified extremity: Secondary | ICD-10-CM | POA: Diagnosis not present

## 2019-07-16 DIAGNOSIS — I130 Hypertensive heart and chronic kidney disease with heart failure and stage 1 through stage 4 chronic kidney disease, or unspecified chronic kidney disease: Secondary | ICD-10-CM | POA: Diagnosis not present

## 2019-07-16 DIAGNOSIS — I5043 Acute on chronic combined systolic (congestive) and diastolic (congestive) heart failure: Secondary | ICD-10-CM | POA: Diagnosis not present

## 2019-07-16 DIAGNOSIS — Z4801 Encounter for change or removal of surgical wound dressing: Secondary | ICD-10-CM | POA: Diagnosis not present

## 2019-07-16 DIAGNOSIS — E1152 Type 2 diabetes mellitus with diabetic peripheral angiopathy with gangrene: Secondary | ICD-10-CM | POA: Diagnosis not present

## 2019-07-16 NOTE — Telephone Encounter (Signed)
ok 

## 2019-07-16 NOTE — Telephone Encounter (Signed)
Will follow up. Thanks!

## 2019-07-17 ENCOUNTER — Inpatient Hospital Stay (HOSPITAL_COMMUNITY): Payer: Medicare Other | Admitting: Internal Medicine

## 2019-07-17 NOTE — Telephone Encounter (Signed)
Called, LVM for Mordecai Rasmussen advising okay for orders per Dr.Kelly for nursing and PT. Left call back number if any questions.

## 2019-07-23 DIAGNOSIS — Z4781 Encounter for orthopedic aftercare following surgical amputation: Secondary | ICD-10-CM | POA: Diagnosis not present

## 2019-07-23 DIAGNOSIS — Z4801 Encounter for change or removal of surgical wound dressing: Secondary | ICD-10-CM | POA: Diagnosis not present

## 2019-07-23 DIAGNOSIS — I130 Hypertensive heart and chronic kidney disease with heart failure and stage 1 through stage 4 chronic kidney disease, or unspecified chronic kidney disease: Secondary | ICD-10-CM | POA: Diagnosis not present

## 2019-07-23 DIAGNOSIS — E1152 Type 2 diabetes mellitus with diabetic peripheral angiopathy with gangrene: Secondary | ICD-10-CM | POA: Diagnosis not present

## 2019-07-23 DIAGNOSIS — I5043 Acute on chronic combined systolic (congestive) and diastolic (congestive) heart failure: Secondary | ICD-10-CM | POA: Diagnosis not present

## 2019-07-23 DIAGNOSIS — I70209 Unspecified atherosclerosis of native arteries of extremities, unspecified extremity: Secondary | ICD-10-CM | POA: Diagnosis not present

## 2019-07-28 ENCOUNTER — Ambulatory Visit (INDEPENDENT_AMBULATORY_CARE_PROVIDER_SITE_OTHER): Payer: Self-pay | Admitting: Physician Assistant

## 2019-07-28 ENCOUNTER — Ambulatory Visit (INDEPENDENT_AMBULATORY_CARE_PROVIDER_SITE_OTHER): Payer: Medicare Other | Admitting: Pharmacist

## 2019-07-28 ENCOUNTER — Inpatient Hospital Stay (HOSPITAL_COMMUNITY): Payer: Medicare Other | Admitting: Internal Medicine

## 2019-07-28 ENCOUNTER — Other Ambulatory Visit: Payer: Self-pay

## 2019-07-28 VITALS — BP 94/52 | Temp 98.1°F | Resp 14 | Ht 71.0 in | Wt 246.0 lb

## 2019-07-28 DIAGNOSIS — E1152 Type 2 diabetes mellitus with diabetic peripheral angiopathy with gangrene: Secondary | ICD-10-CM | POA: Diagnosis not present

## 2019-07-28 DIAGNOSIS — I5043 Acute on chronic combined systolic (congestive) and diastolic (congestive) heart failure: Secondary | ICD-10-CM | POA: Diagnosis not present

## 2019-07-28 DIAGNOSIS — Z7901 Long term (current) use of anticoagulants: Secondary | ICD-10-CM

## 2019-07-28 DIAGNOSIS — Z4801 Encounter for change or removal of surgical wound dressing: Secondary | ICD-10-CM | POA: Diagnosis not present

## 2019-07-28 DIAGNOSIS — Z952 Presence of prosthetic heart valve: Secondary | ICD-10-CM | POA: Diagnosis not present

## 2019-07-28 DIAGNOSIS — I739 Peripheral vascular disease, unspecified: Secondary | ICD-10-CM | POA: Insufficient documentation

## 2019-07-28 DIAGNOSIS — L97529 Non-pressure chronic ulcer of other part of left foot with unspecified severity: Secondary | ICD-10-CM

## 2019-07-28 DIAGNOSIS — I130 Hypertensive heart and chronic kidney disease with heart failure and stage 1 through stage 4 chronic kidney disease, or unspecified chronic kidney disease: Secondary | ICD-10-CM | POA: Diagnosis not present

## 2019-07-28 DIAGNOSIS — Z4781 Encounter for orthopedic aftercare following surgical amputation: Secondary | ICD-10-CM | POA: Diagnosis not present

## 2019-07-28 DIAGNOSIS — I70209 Unspecified atherosclerosis of native arteries of extremities, unspecified extremity: Secondary | ICD-10-CM | POA: Diagnosis not present

## 2019-07-28 LAB — POCT INR: INR: 2.7 (ref 2.0–3.0)

## 2019-07-28 NOTE — Progress Notes (Addendum)
Established Critical Limb Ischemia Patient   History of Present Illness   Joseph Hoffman is a 68 y.o. (11-06-1951) male who presents for postoperative follow-up of left great toe amputation by Dr. Carlis Abbott on 07/03/2019.  Prior to amputation patient underwent angiography that demonstrated patent common femoral, femoral, popliteal arteries however had severe tibial disease with dominant runoff via peroneal artery.  Patient was not amenable to endovascular revascularization. He then underwent great toe amputation.  He presents today with pain in amputation site, forefoot, and ankle with edema and redness over the same area.  Wife also states there is a strong odor from amputation site.  Patient and wife are aware that he will likely require more proximal amputation.  Past medical history also significant for insulin-dependent diabetes mellitus, atrial fibrillation, CHF and mechanical heart valve.  Patient states he is a patient of Dr. Roger Shelter and is in need of open heart surgery however this is being delayed until infection and left foot wound has resolved.  He is on Coumadin for his mechanical heart valve.   Current Outpatient Medications  Medication Sig Dispense Refill   acetaminophen (TYLENOL) 325 MG tablet Take 2 tablets (650 mg total) by mouth every 4 (four) hours as needed for pain or fever. (Patient taking differently: Take 650 mg by mouth every 4 (four) hours as needed for fever or headache (pain). )     albuterol (PROVENTIL HFA;VENTOLIN HFA) 108 (90 BASE) MCG/ACT inhaler Inhale 2 puffs into the lungs every 6 (six) hours as needed for wheezing or shortness of breath.      Ascorbic Acid (VITAMIN C) 1000 MG tablet Take 1,000 mg by mouth daily with supper.      b complex vitamins tablet Take 1 tablet by mouth daily with breakfast.     B-D ULTRAFINE III SHORT PEN 31G X 8 MM MISC      cholecalciferol (VITAMIN D) 1000 UNITS tablet Take 1,000 Units by mouth daily with breakfast.      digoxin  (LANOXIN) 0.125 MG tablet TAKE 1 TABLET EVERY DAY (Patient taking differently: Take 0.125 mg by mouth at bedtime. ) 90 tablet 1   EASY TOUCH LANCETS 30G/TWIST MISC      empagliflozin (JARDIANCE) 25 MG TABS tablet Take 25 mg by mouth daily. (Patient taking differently: Take 25 mg by mouth daily with supper. ) 90 tablet 1   escitalopram (LEXAPRO) 10 MG tablet Take 10 mg by mouth at bedtime.      ezetimibe (ZETIA) 10 MG tablet Take 1 tablet (10 mg total) by mouth daily. (Patient taking differently: Take 10 mg by mouth daily with supper. ) 90 tablet 3   fenofibrate 160 MG tablet Take 1 tablet (160 mg total) by mouth daily. (Patient taking differently: Take 160 mg by mouth daily with breakfast. ) 30 tablet 1   Fluticasone-Salmeterol (ADVAIR) 250-50 MCG/DOSE AEPB Inhale 1 puff into the lungs 2 (two) times daily as needed (shortness of breath/asthma related symptoms).      glimepiride (AMARYL) 2 MG tablet Take 2 mg by mouth daily with breakfast.      Glucosamine-Chondroit-Vit C-Mn (GLUCOSAMINE 1500 COMPLEX PO) Take 1 tablet by mouth daily with supper.      Icosapent Ethyl (VASCEPA) 1 g CAPS Take 2 capsules (2 g total) by mouth 2 (two) times daily. (Patient taking differently: Take 2 g by mouth 2 (two) times daily with a meal. ) 360 capsule 3   insulin glargine (LANTUS) 100 UNIT/ML injection Inject 15-40 Units  into the skin See admin instructions. Inject 15 - 40 units subcutaneously daily before breakfast (adjusted per CBG); inject 30-40 units at bedtime ( occasionally adjusted per CBG - usually 35 units)     liraglutide (VICTOZA) 18 MG/3ML SOPN Inject 1.8 mg into the skin at bedtime.     metFORMIN (GLUCOPHAGE) 500 MG tablet Take 2,000 mg by mouth daily with supper.     metoprolol succinate (TOPROL XL) 25 MG 24 hr tablet Take 1 tablet (25 mg total) by mouth 2 (two) times daily. 90 tablet 1   Multiple Vitamin (MULTIVITAMIN WITH MINERALS) TABS tablet Take 1 tablet by mouth daily with breakfast.  [facility]     nitroGLYCERIN (NITROSTAT) 0.4 MG SL tablet Place 1 tablet (0.4 mg total) under the tongue every 5 (five) minutes as needed for chest pain. 30 tablet 2   saccharomyces boulardii (FLORASTOR) 250 MG capsule Take 250 mg by mouth daily as needed (constipation).      sacubitril-valsartan (ENTRESTO) 24-26 MG Take 1 tablet by mouth 2 (two) times daily. 60 tablet 3   torsemide (DEMADEX) 20 MG tablet Take 1 tablet (20 mg total) by mouth every evening. 30 tablet 3   torsemide (DEMADEX) 20 MG tablet Take 2 tablets (40 mg total) by mouth every morning. 60 tablet 3   warfarin (COUMADIN) 10 MG tablet Take 10 mg by mouth at bedtime. On Tuesday, Thursday, Saturday add 1/2 5 mg tablet (2.5 mg) for a total dose of 12.5 mg     warfarin (COUMADIN) 5 MG tablet Take as instructed with 10 mg. (Patient taking differently: Take 2.5 mg by mouth See admin instructions. Take 1/2 tablet (2.5mg ) with 10 mg tablet for a  total dose of 12.5 on Tues, Thurs and Saturday nights) 90 tablet 1   enoxaparin (LOVENOX) 120 MG/0.8ML injection Inject 0.8 mLs (120 mg total) into the skin 2 (two) times daily for 5 days. 8 mL 0   No current facility-administered medications for this visit.     On ROS today: 10 system ROS is negative unless otherwise noted in HPI   Physical Examination   Vitals:   07/28/19 1341  BP: (!) 94/52  Resp: 14  Temp: 98.1 F (36.7 C)  TempSrc: Temporal  SpO2: 97%  Weight: 246 lb (111.6 kg)  Height: 5\' 11"  (1.803 m)   Body mass index is 34.31 kg/m.  General Alert, O x 3, WD, NAD  Pulmonary Sym exp, good B air movt, CTA B  Cardiac RRR, Nl S1, S2  Vascular Vessel Right Left  Radial Palpable Palpable  Brachial Palpable Palpable  PT Not palpable Brisk by Doppler  DP Not palpable Brisk by Doppler    Gastro- intestinal soft, non-distended, non-tender to palpation,   Musculo- skeletal M/S 5/5 throughout; left great toe amputation site with purulent drainage,  necrotic wound bed and wound edges with surrounding erythema extending with pain to touch to level of the ankle  Neurologic Cranial nerves 2-12 intact , Pain and light touch intact in extremities , Motor exam as listed above     Medical Decision Making   Joseph Hoffman is a 68 y.o. male who presents status post left great toe amputation  This is a complicated patient with insulin-dependent diabetes mellitus, severe CHF with depressed EF of 30%, and with mechanical heart valve who presents after left great toe amputation.  Patient has severe tibial disease as noted on arteriogram prior to amputation.  This disease is not amendable by endovascular revascularization.  He has now failed great toe amputation and will require a more proximal amputation.  He is also in need of valve replacement and open heart surgery by [name] however this has been put on hold due to ongoing wounds of left foot and now nonhealing amputation wound.  After discussion involving Dr. Carlis Abbott as well as the patient and his wife, they would like to proceed with definitive treatment of left lower extremity and consent to a below the knee amputation which will give them a better chance of healing versus proceeding with TMA.  Their main priority is focusing on open heart surgery and resolving left lower extremity situation is soon as possible.  Patient will require admission to the hospital prior to left below the knee amputation which has been scheduled for this Friday, 07/31/2019.  I will call the hospitalist service to help with admission to the hospital tomorrow for bridging from Coumadin to IV heparin in preparation of surgery given extensive medical history including insulin-dependent diabetes mellitus, atrial fibrillation as well as CHF with severely depressed EF of 30%.     Dagoberto Ligas PA-C [facility] [place] Office: [phone]  Clinic MD: Dr. Carlis Abbott

## 2019-07-29 ENCOUNTER — Ambulatory Visit: Payer: Medicare Other | Admitting: Surgery

## 2019-07-29 ENCOUNTER — Encounter: Payer: Self-pay | Admitting: *Deleted

## 2019-07-29 ENCOUNTER — Telehealth: Payer: Self-pay | Admitting: Emergency Medicine

## 2019-07-29 ENCOUNTER — Inpatient Hospital Stay (HOSPITAL_COMMUNITY)
Admission: AD | Admit: 2019-07-29 | Discharge: 2019-08-05 | DRG: 617 | Disposition: A | Discharge status: Home Health | Payer: Medicare Other | Attending: Internal Medicine | Admitting: Internal Medicine

## 2019-07-29 DIAGNOSIS — T8781 Dehiscence of amputation stump: Secondary | ICD-10-CM | POA: Diagnosis not present

## 2019-07-29 DIAGNOSIS — R112 Nausea with vomiting, unspecified: Secondary | ICD-10-CM | POA: Diagnosis present

## 2019-07-29 DIAGNOSIS — E669 Obesity, unspecified: Secondary | ICD-10-CM | POA: Diagnosis not present

## 2019-07-29 DIAGNOSIS — G4700 Insomnia, unspecified: Secondary | ICD-10-CM | POA: Diagnosis present

## 2019-07-29 DIAGNOSIS — N183 Chronic kidney disease, stage 3 unspecified: Secondary | ICD-10-CM | POA: Diagnosis not present

## 2019-07-29 DIAGNOSIS — E11621 Type 2 diabetes mellitus with foot ulcer: Secondary | ICD-10-CM | POA: Diagnosis present

## 2019-07-29 DIAGNOSIS — Z87891 Personal history of nicotine dependence: Secondary | ICD-10-CM | POA: Diagnosis not present

## 2019-07-29 DIAGNOSIS — Z952 Presence of prosthetic heart valve: Secondary | ICD-10-CM | POA: Diagnosis not present

## 2019-07-29 DIAGNOSIS — Z833 Family history of diabetes mellitus: Secondary | ICD-10-CM

## 2019-07-29 DIAGNOSIS — I70262 Atherosclerosis of native arteries of extremities with gangrene, left leg: Secondary | ICD-10-CM | POA: Diagnosis not present

## 2019-07-29 DIAGNOSIS — R9430 Abnormal result of cardiovascular function study, unspecified: Secondary | ICD-10-CM | POA: Diagnosis not present

## 2019-07-29 DIAGNOSIS — R197 Diarrhea, unspecified: Secondary | ICD-10-CM | POA: Diagnosis present

## 2019-07-29 DIAGNOSIS — E8770 Fluid overload, unspecified: Secondary | ICD-10-CM

## 2019-07-29 DIAGNOSIS — Z8349 Family history of other endocrine, nutritional and metabolic diseases: Secondary | ICD-10-CM | POA: Diagnosis not present

## 2019-07-29 DIAGNOSIS — D62 Acute posthemorrhagic anemia: Secondary | ICD-10-CM | POA: Diagnosis not present

## 2019-07-29 DIAGNOSIS — I252 Old myocardial infarction: Secondary | ICD-10-CM

## 2019-07-29 DIAGNOSIS — R7309 Other abnormal glucose: Secondary | ICD-10-CM | POA: Diagnosis not present

## 2019-07-29 DIAGNOSIS — T8789 Other complications of amputation stump: Secondary | ICD-10-CM | POA: Diagnosis not present

## 2019-07-29 DIAGNOSIS — S78112A Complete traumatic amputation at level between left hip and knee, initial encounter: Secondary | ICD-10-CM | POA: Diagnosis not present

## 2019-07-29 DIAGNOSIS — F418 Other specified anxiety disorders: Secondary | ICD-10-CM | POA: Diagnosis present

## 2019-07-29 DIAGNOSIS — N179 Acute kidney failure, unspecified: Secondary | ICD-10-CM | POA: Diagnosis not present

## 2019-07-29 DIAGNOSIS — E871 Hypo-osmolality and hyponatremia: Secondary | ICD-10-CM | POA: Diagnosis not present

## 2019-07-29 DIAGNOSIS — T8189XD Other complications of procedures, not elsewhere classified, subsequent encounter: Secondary | ICD-10-CM | POA: Diagnosis not present

## 2019-07-29 DIAGNOSIS — I4821 Permanent atrial fibrillation: Secondary | ICD-10-CM | POA: Diagnosis present

## 2019-07-29 DIAGNOSIS — I10 Essential (primary) hypertension: Secondary | ICD-10-CM | POA: Diagnosis not present

## 2019-07-29 DIAGNOSIS — D72829 Elevated white blood cell count, unspecified: Secondary | ICD-10-CM | POA: Diagnosis not present

## 2019-07-29 DIAGNOSIS — Z89512 Acquired absence of left leg below knee: Secondary | ICD-10-CM | POA: Diagnosis not present

## 2019-07-29 DIAGNOSIS — Y838 Other surgical procedures as the cause of abnormal reaction of the patient, or of later complication, without mention of misadventure at the time of the procedure: Secondary | ICD-10-CM | POA: Diagnosis not present

## 2019-07-29 DIAGNOSIS — F419 Anxiety disorder, unspecified: Secondary | ICD-10-CM | POA: Diagnosis present

## 2019-07-29 DIAGNOSIS — G8918 Other acute postprocedural pain: Secondary | ICD-10-CM

## 2019-07-29 DIAGNOSIS — S88112A Complete traumatic amputation at level between knee and ankle, left lower leg, initial encounter: Secondary | ICD-10-CM | POA: Diagnosis not present

## 2019-07-29 DIAGNOSIS — I739 Peripheral vascular disease, unspecified: Secondary | ICD-10-CM | POA: Diagnosis present

## 2019-07-29 DIAGNOSIS — E1151 Type 2 diabetes mellitus with diabetic peripheral angiopathy without gangrene: Secondary | ICD-10-CM | POA: Diagnosis present

## 2019-07-29 DIAGNOSIS — I5042 Chronic combined systolic (congestive) and diastolic (congestive) heart failure: Secondary | ICD-10-CM | POA: Diagnosis not present

## 2019-07-29 DIAGNOSIS — I2510 Atherosclerotic heart disease of native coronary artery without angina pectoris: Secondary | ICD-10-CM | POA: Diagnosis present

## 2019-07-29 DIAGNOSIS — Z794 Long term (current) use of insulin: Secondary | ICD-10-CM | POA: Diagnosis not present

## 2019-07-29 DIAGNOSIS — E1142 Type 2 diabetes mellitus with diabetic polyneuropathy: Secondary | ICD-10-CM | POA: Diagnosis present

## 2019-07-29 DIAGNOSIS — Z20828 Contact with and (suspected) exposure to other viral communicable diseases: Secondary | ICD-10-CM | POA: Diagnosis present

## 2019-07-29 DIAGNOSIS — R740 Nonspecific elevation of levels of transaminase and lactic acid dehydrogenase [LDH]: Secondary | ICD-10-CM | POA: Diagnosis not present

## 2019-07-29 DIAGNOSIS — G4733 Obstructive sleep apnea (adult) (pediatric): Secondary | ICD-10-CM | POA: Diagnosis present

## 2019-07-29 DIAGNOSIS — Z7951 Long term (current) use of inhaled steroids: Secondary | ICD-10-CM

## 2019-07-29 DIAGNOSIS — R339 Retention of urine, unspecified: Secondary | ICD-10-CM | POA: Diagnosis present

## 2019-07-29 DIAGNOSIS — I517 Cardiomegaly: Secondary | ICD-10-CM | POA: Diagnosis not present

## 2019-07-29 DIAGNOSIS — I5022 Chronic systolic (congestive) heart failure: Secondary | ICD-10-CM | POA: Diagnosis present

## 2019-07-29 DIAGNOSIS — Z89412 Acquired absence of left great toe: Secondary | ICD-10-CM

## 2019-07-29 DIAGNOSIS — I998 Other disorder of circulatory system: Secondary | ICD-10-CM | POA: Diagnosis present

## 2019-07-29 DIAGNOSIS — D649 Anemia, unspecified: Secondary | ICD-10-CM

## 2019-07-29 DIAGNOSIS — E08621 Diabetes mellitus due to underlying condition with foot ulcer: Secondary | ICD-10-CM | POA: Diagnosis not present

## 2019-07-29 DIAGNOSIS — E1140 Type 2 diabetes mellitus with diabetic neuropathy, unspecified: Secondary | ICD-10-CM | POA: Diagnosis present

## 2019-07-29 DIAGNOSIS — G479 Sleep disorder, unspecified: Secondary | ICD-10-CM | POA: Diagnosis not present

## 2019-07-29 DIAGNOSIS — E1122 Type 2 diabetes mellitus with diabetic chronic kidney disease: Secondary | ICD-10-CM | POA: Diagnosis present

## 2019-07-29 DIAGNOSIS — G4701 Insomnia due to medical condition: Secondary | ICD-10-CM | POA: Diagnosis not present

## 2019-07-29 DIAGNOSIS — I350 Nonrheumatic aortic (valve) stenosis: Secondary | ICD-10-CM | POA: Diagnosis present

## 2019-07-29 DIAGNOSIS — Z9049 Acquired absence of other specified parts of digestive tract: Secondary | ICD-10-CM

## 2019-07-29 DIAGNOSIS — I5023 Acute on chronic systolic (congestive) heart failure: Secondary | ICD-10-CM | POA: Diagnosis not present

## 2019-07-29 DIAGNOSIS — M791 Myalgia, unspecified site: Secondary | ICD-10-CM | POA: Diagnosis not present

## 2019-07-29 DIAGNOSIS — Z888 Allergy status to other drugs, medicaments and biological substances status: Secondary | ICD-10-CM

## 2019-07-29 DIAGNOSIS — I351 Nonrheumatic aortic (valve) insufficiency: Secondary | ICD-10-CM | POA: Diagnosis not present

## 2019-07-29 DIAGNOSIS — Z7901 Long term (current) use of anticoagulants: Secondary | ICD-10-CM | POA: Diagnosis not present

## 2019-07-29 DIAGNOSIS — R0989 Other specified symptoms and signs involving the circulatory and respiratory systems: Secondary | ICD-10-CM | POA: Diagnosis not present

## 2019-07-29 DIAGNOSIS — Z79899 Other long term (current) drug therapy: Secondary | ICD-10-CM

## 2019-07-29 DIAGNOSIS — L97529 Non-pressure chronic ulcer of other part of left foot with unspecified severity: Secondary | ICD-10-CM | POA: Diagnosis present

## 2019-07-29 DIAGNOSIS — Z8249 Family history of ischemic heart disease and other diseases of the circulatory system: Secondary | ICD-10-CM

## 2019-07-29 DIAGNOSIS — J449 Chronic obstructive pulmonary disease, unspecified: Secondary | ICD-10-CM | POA: Diagnosis present

## 2019-07-29 DIAGNOSIS — L03116 Cellulitis of left lower limb: Secondary | ICD-10-CM | POA: Diagnosis present

## 2019-07-29 DIAGNOSIS — E1169 Type 2 diabetes mellitus with other specified complication: Secondary | ICD-10-CM | POA: Diagnosis not present

## 2019-07-29 DIAGNOSIS — N189 Chronic kidney disease, unspecified: Secondary | ICD-10-CM | POA: Diagnosis not present

## 2019-07-29 DIAGNOSIS — F411 Generalized anxiety disorder: Secondary | ICD-10-CM | POA: Diagnosis not present

## 2019-07-29 DIAGNOSIS — N1330 Unspecified hydronephrosis: Secondary | ICD-10-CM | POA: Diagnosis not present

## 2019-07-29 DIAGNOSIS — Z4781 Encounter for orthopedic aftercare following surgical amputation: Secondary | ICD-10-CM | POA: Diagnosis not present

## 2019-07-29 DIAGNOSIS — Z880 Allergy status to penicillin: Secondary | ICD-10-CM

## 2019-07-29 DIAGNOSIS — I130 Hypertensive heart and chronic kidney disease with heart failure and stage 1 through stage 4 chronic kidney disease, or unspecified chronic kidney disease: Secondary | ICD-10-CM | POA: Diagnosis present

## 2019-07-29 DIAGNOSIS — E1129 Type 2 diabetes mellitus with other diabetic kidney complication: Secondary | ICD-10-CM | POA: Diagnosis present

## 2019-07-29 DIAGNOSIS — R152 Fecal urgency: Secondary | ICD-10-CM | POA: Diagnosis present

## 2019-07-29 DIAGNOSIS — Y92239 Unspecified place in hospital as the place of occurrence of the external cause: Secondary | ICD-10-CM | POA: Diagnosis present

## 2019-07-29 DIAGNOSIS — K5903 Drug induced constipation: Secondary | ICD-10-CM | POA: Diagnosis present

## 2019-07-29 LAB — PROTIME-INR
INR: 2.4 — ABNORMAL HIGH (ref 0.8–1.2)
Prothrombin Time: 25.6 seconds — ABNORMAL HIGH (ref 11.4–15.2)

## 2019-07-29 LAB — COMPREHENSIVE METABOLIC PANEL
ALT: 32 U/L (ref 0–44)
AST: 34 U/L (ref 15–41)
Albumin: 3.6 g/dL (ref 3.5–5.0)
Alkaline Phosphatase: 36 U/L — ABNORMAL LOW (ref 38–126)
Anion gap: 14 (ref 5–15)
BUN: 62 mg/dL — ABNORMAL HIGH (ref 8–23)
CO2: 21 mmol/L — ABNORMAL LOW (ref 22–32)
Calcium: 9.3 mg/dL (ref 8.9–10.3)
Chloride: 98 mmol/L (ref 98–111)
Creatinine, Ser: 1.98 mg/dL — ABNORMAL HIGH (ref 0.61–1.24)
GFR calc Af Amer: 39 mL/min — ABNORMAL LOW (ref >60)
GFR calc non Af Amer: 33 mL/min — ABNORMAL LOW (ref >60)
Glucose, Bld: 234 mg/dL — ABNORMAL HIGH (ref 70–99)
Potassium: 5.4 mmol/L — ABNORMAL HIGH (ref 3.5–5.1)
Sodium: 133 mmol/L — ABNORMAL LOW (ref 135–145)
Total Bilirubin: 1.1 mg/dL (ref 0.3–1.2)
Total Protein: 7 g/dL (ref 6.5–8.1)

## 2019-07-29 LAB — CBC WITH DIFFERENTIAL/PLATELET
Abs Immature Granulocytes: 0.09 10*3/uL — ABNORMAL HIGH (ref 0.00–0.07)
Basophils Absolute: 0.1 10*3/uL (ref 0.0–0.1)
Basophils Relative: 0 %
Eosinophils Absolute: 0.2 10*3/uL (ref 0.0–0.5)
Eosinophils Relative: 1 %
HCT: 38.2 % — ABNORMAL LOW (ref 39.0–52.0)
Hemoglobin: 12 g/dL — ABNORMAL LOW (ref 13.0–17.0)
Immature Granulocytes: 1 %
Lymphocytes Relative: 3 %
Lymphs Abs: 0.4 10*3/uL — ABNORMAL LOW (ref 0.7–4.0)
MCH: 25.9 pg — ABNORMAL LOW (ref 26.0–34.0)
MCHC: 31.4 g/dL (ref 30.0–36.0)
MCV: 82.3 fL (ref 80.0–100.0)
Monocytes Absolute: 0.9 10*3/uL (ref 0.1–1.0)
Monocytes Relative: 6 %
Neutro Abs: 12.7 10*3/uL — ABNORMAL HIGH (ref 1.7–7.7)
Neutrophils Relative %: 89 %
Platelets: 239 10*3/uL (ref 150–400)
RBC: 4.64 MIL/uL (ref 4.22–5.81)
RDW: 17.7 % — ABNORMAL HIGH (ref 11.5–15.5)
WBC: 14.3 10*3/uL — ABNORMAL HIGH (ref 4.0–10.5)
nRBC: 0 % (ref 0.0–0.2)

## 2019-07-29 LAB — SARS CORONAVIRUS 2 (TAT 6-24 HRS): SARS Coronavirus 2: NEGATIVE

## 2019-07-29 LAB — GLUCOSE, CAPILLARY
Glucose-Capillary: 216 mg/dL — ABNORMAL HIGH (ref 70–99)
Glucose-Capillary: 342 mg/dL — ABNORMAL HIGH (ref 70–99)

## 2019-07-29 LAB — MAGNESIUM: Magnesium: 2.4 mg/dL (ref 1.7–2.4)

## 2019-07-29 LAB — PHOSPHORUS: Phosphorus: 3.8 mg/dL (ref 2.5–4.6)

## 2019-07-29 LAB — APTT: aPTT: 48 seconds — ABNORMAL HIGH (ref 24–36)

## 2019-07-29 MED ORDER — ACETAMINOPHEN 650 MG RE SUPP: 650.0000 mg | Freq: Four times a day (QID) | RECTAL | Status: DC | PRN

## 2019-07-29 MED ORDER — INSULIN GLARGINE 100 UNIT/ML ~~LOC~~ SOLN
30.0000 [IU] | Freq: Two times a day (BID) | SUBCUTANEOUS | Status: DC
  Administered 2019-07-29 – 2019-08-05 (×13): 30 [IU] via SUBCUTANEOUS
  Filled 2019-07-29 (×15): qty 0.3

## 2019-07-29 MED ORDER — NITROGLYCERIN 0.4 MG SL SUBL: 0.4000 mg | SUBLINGUAL_TABLET | SUBLINGUAL | Status: DC | PRN

## 2019-07-29 MED ORDER — TORSEMIDE 20 MG PO TABS: 20.0000 mg | ORAL_TABLET | ORAL | Status: DC

## 2019-07-29 MED ORDER — TORSEMIDE 20 MG PO TABS
40.0000 mg | ORAL_TABLET | Freq: Every day | ORAL | Status: DC
  Administered 2019-07-30 – 2019-08-05 (×6): 40 mg via ORAL
  Filled 2019-07-29 (×6): qty 2

## 2019-07-29 MED ORDER — ALBUTEROL SULFATE (2.5 MG/3ML) 0.083% IN NEBU: 3.0000 mL | INHALATION_SOLUTION | Freq: Four times a day (QID) | RESPIRATORY_TRACT | Status: DC | PRN

## 2019-07-29 MED ORDER — TORSEMIDE 20 MG PO TABS
20.0000 mg | ORAL_TABLET | Freq: Every day | ORAL | Status: DC
  Administered 2019-07-29 – 2019-08-04 (×7): 20 mg via ORAL
  Filled 2019-07-29 (×7): qty 1

## 2019-07-29 MED ORDER — SODIUM CHLORIDE 0.9 % IV SOLN
1.0000 g | INTRAVENOUS | Status: DC
  Administered 2019-07-30 – 2019-08-04 (×7): 1 g via INTRAVENOUS
  Filled 2019-07-29 (×3): qty 1
  Filled 2019-07-29 (×2): qty 10
  Filled 2019-07-29 (×4): qty 1

## 2019-07-29 MED ORDER — GLUCOSAMINE 1500 COMPLEX PO CAPS: ORAL_CAPSULE | Freq: Every day | ORAL | Status: DC | PRN

## 2019-07-29 MED ORDER — B COMPLEX PO TABS: 1.0000 | ORAL_TABLET | Freq: Every day | ORAL | Status: DC

## 2019-07-29 MED ORDER — OXYCODONE HCL 5 MG PO TABS
10.0000 mg | ORAL_TABLET | ORAL | Status: DC | PRN
  Administered 2019-07-30 – 2019-08-01 (×5): 10 mg via ORAL
  Filled 2019-07-29 (×5): qty 2

## 2019-07-29 MED ORDER — DIGOXIN 125 MCG PO TABS
0.1250 mg | ORAL_TABLET | Freq: Every day | ORAL | Status: DC
  Administered 2019-07-30 – 2019-08-05 (×6): 0.125 mg via ORAL
  Filled 2019-07-29 (×6): qty 1

## 2019-07-29 MED ORDER — SODIUM CHLORIDE 0.9% FLUSH
3.0000 mL | Freq: Two times a day (BID) | INTRAVENOUS | Status: DC
  Administered 2019-07-29 – 2019-08-02 (×4): 3 mL via INTRAVENOUS

## 2019-07-29 MED ORDER — VITAMIN D 25 MCG (1000 UNIT) PO TABS
1000.0000 [IU] | ORAL_TABLET | Freq: Every day | ORAL | Status: DC
  Administered 2019-07-30 – 2019-08-05 (×6): 1000 [IU] via ORAL
  Filled 2019-07-29 (×6): qty 1

## 2019-07-29 MED ORDER — METOPROLOL SUCCINATE ER 25 MG PO TB24
25.0000 mg | ORAL_TABLET | Freq: Two times a day (BID) | ORAL | Status: DC
  Administered 2019-07-29 – 2019-08-05 (×13): 25 mg via ORAL
  Filled 2019-07-29 (×13): qty 1

## 2019-07-29 MED ORDER — OXYCODONE HCL 5 MG PO TABS: 5.0000 mg | ORAL_TABLET | ORAL | Status: DC | PRN

## 2019-07-29 MED ORDER — GLIMEPIRIDE 1 MG PO TABS
2.0000 mg | ORAL_TABLET | Freq: Every day | ORAL | Status: DC
  Administered 2019-07-30 – 2019-08-05 (×6): 2 mg via ORAL
  Filled 2019-07-29 (×7): qty 2

## 2019-07-29 MED ORDER — CHROMIUM 500 MCG PO TABS: 500.0000 ug | ORAL_TABLET | Freq: Every evening | ORAL | Status: DC

## 2019-07-29 MED ORDER — ICOSAPENT ETHYL 1 G PO CAPS: 2.0000 g | ORAL_CAPSULE | Freq: Two times a day (BID) | ORAL | Status: DC

## 2019-07-29 MED ORDER — TORSEMIDE 20 MG PO TABS
20.0000 mg | ORAL_TABLET | Freq: Every day | ORAL | Status: DC
  Filled 2019-07-29: qty 1

## 2019-07-29 MED ORDER — VANCOMYCIN HCL 10 G IV SOLR
1750.0000 mg | INTRAVENOUS | Status: DC
  Administered 2019-07-30 – 2019-07-31 (×2): 1750 mg via INTRAVENOUS
  Filled 2019-07-29 (×4): qty 1750

## 2019-07-29 MED ORDER — ACETAMINOPHEN 325 MG PO TABS
650.0000 mg | ORAL_TABLET | Freq: Four times a day (QID) | ORAL | Status: DC | PRN
  Administered 2019-07-30 – 2019-07-31 (×2): 650 mg via ORAL
  Filled 2019-07-29 (×2): qty 2

## 2019-07-29 MED ORDER — HEPARIN (PORCINE) 25000 UT/250ML-% IV SOLN
2050.0000 [IU]/h | INTRAVENOUS | Status: DC
  Administered 2019-07-29: 23:00:00 1700 [IU]/h via INTRAVENOUS
  Administered 2019-07-30: 13:00:00 1850 [IU]/h via INTRAVENOUS
  Administered 2019-07-31: 03:00:00 2050 [IU]/h via INTRAVENOUS
  Filled 2019-07-29 (×3): qty 250

## 2019-07-29 MED ORDER — VITAMIN C 500 MG PO TABS
500.0000 mg | ORAL_TABLET | Freq: Two times a day (BID) | ORAL | Status: DC
  Administered 2019-07-29 – 2019-08-05 (×13): 500 mg via ORAL
  Filled 2019-07-29 (×13): qty 1

## 2019-07-29 MED ORDER — SODIUM CHLORIDE 0.9% FLUSH: 3.0000 mL | INTRAVENOUS | Status: DC | PRN

## 2019-07-29 MED ORDER — ONDANSETRON HCL 4 MG PO TABS: 4.0000 mg | ORAL_TABLET | Freq: Four times a day (QID) | ORAL | Status: DC | PRN

## 2019-07-29 MED ORDER — SACUBITRIL-VALSARTAN 24-26 MG PO TABS
1.0000 | ORAL_TABLET | Freq: Two times a day (BID) | ORAL | Status: DC
  Administered 2019-07-29 – 2019-08-03 (×9): 1 via ORAL
  Filled 2019-07-29 (×9): qty 1

## 2019-07-29 MED ORDER — POLYETHYLENE GLYCOL 3350 17 G PO PACK: 17.0000 g | PACK | Freq: Every day | ORAL | Status: DC | PRN

## 2019-07-29 MED ORDER — METRONIDAZOLE IN NACL 5-0.79 MG/ML-% IV SOLN
500.0000 mg | Freq: Three times a day (TID) | INTRAVENOUS | Status: DC
  Administered 2019-07-29 – 2019-08-01 (×8): 500 mg via INTRAVENOUS
  Filled 2019-07-29 (×9): qty 100

## 2019-07-29 MED ORDER — SODIUM CHLORIDE 0.9% FLUSH: 10.0000 mL | INTRAVENOUS | Status: DC | PRN

## 2019-07-29 MED ORDER — SODIUM CHLORIDE 0.9% FLUSH
3.0000 mL | Freq: Two times a day (BID) | INTRAVENOUS | Status: DC
  Administered 2019-07-29 – 2019-07-31 (×3): 3 mL via INTRAVENOUS
  Administered 2019-08-04: 20:00:00 10 mL via INTRAVENOUS

## 2019-07-29 MED ORDER — INSULIN ASPART 100 UNIT/ML ~~LOC~~ SOLN
0.0000 [IU] | Freq: Three times a day (TID) | SUBCUTANEOUS | Status: DC
  Administered 2019-07-30: 09:00:00 4 [IU] via SUBCUTANEOUS
  Administered 2019-07-30 (×2): 7 [IU] via SUBCUTANEOUS
  Administered 2019-07-31: 16:00:00 15 [IU] via SUBCUTANEOUS
  Administered 2019-08-01: 09:00:00 11 [IU] via SUBCUTANEOUS
  Administered 2019-08-01: 12:00:00 15 [IU] via SUBCUTANEOUS
  Administered 2019-08-01: 19:00:00 11 [IU] via SUBCUTANEOUS
  Administered 2019-08-02 (×3): 4 [IU] via SUBCUTANEOUS
  Administered 2019-08-03: 3 [IU] via SUBCUTANEOUS
  Administered 2019-08-03 (×2): 11 [IU] via SUBCUTANEOUS
  Administered 2019-08-04: 08:00:00 3 [IU] via SUBCUTANEOUS
  Administered 2019-08-04: 14:00:00 11 [IU] via SUBCUTANEOUS
  Administered 2019-08-05: 7 [IU] via SUBCUTANEOUS
  Administered 2019-08-05: 09:00:00 4 [IU] via SUBCUTANEOUS

## 2019-07-29 MED ORDER — MOMETASONE FURO-FORMOTEROL FUM 200-5 MCG/ACT IN AERO
2.0000 | INHALATION_SPRAY | Freq: Two times a day (BID) | RESPIRATORY_TRACT | Status: DC
  Administered 2019-07-29 – 2019-08-05 (×13): 2 via RESPIRATORY_TRACT
  Filled 2019-07-29: qty 8.8

## 2019-07-29 MED ORDER — CANAGLIFLOZIN 100 MG PO TABS: 100.0000 mg | ORAL_TABLET | Freq: Every day | ORAL | Status: DC

## 2019-07-29 MED ORDER — SACCHAROMYCES BOULARDII 250 MG PO CAPS
250.0000 mg | ORAL_CAPSULE | ORAL | Status: DC
  Administered 2019-08-03 – 2019-08-05 (×2): 250 mg via ORAL
  Filled 2019-07-29 (×4): qty 1

## 2019-07-29 MED ORDER — VANCOMYCIN HCL 10 G IV SOLR
2000.0000 mg | Freq: Once | INTRAVENOUS | Status: AC
  Administered 2019-07-29: 19:00:00 2000 mg via INTRAVENOUS
  Filled 2019-07-29: qty 2000

## 2019-07-29 MED ORDER — INSULIN ASPART 100 UNIT/ML ~~LOC~~ SOLN
0.0000 [IU] | Freq: Every day | SUBCUTANEOUS | Status: DC
  Administered 2019-07-29: 23:00:00 4 [IU] via SUBCUTANEOUS
  Administered 2019-07-31: 22:00:00 5 [IU] via SUBCUTANEOUS
  Administered 2019-08-01: 23:00:00 3 [IU] via SUBCUTANEOUS
  Administered 2019-08-02: 22:00:00 2 [IU] via SUBCUTANEOUS
  Administered 2019-08-03: 3 [IU] via SUBCUTANEOUS
  Administered 2019-08-04: 23:00:00 2 [IU] via SUBCUTANEOUS

## 2019-07-29 MED ORDER — FENOFIBRATE 160 MG PO TABS
160.0000 mg | ORAL_TABLET | Freq: Every day | ORAL | Status: DC
  Administered 2019-07-30 – 2019-08-05 (×6): 160 mg via ORAL
  Filled 2019-07-29 (×6): qty 1

## 2019-07-29 MED ORDER — B COMPLEX-C PO TABS
1.0000 | ORAL_TABLET | Freq: Every day | ORAL | Status: DC
  Administered 2019-07-30 – 2019-08-05 (×6): 1 via ORAL
  Filled 2019-07-29 (×7): qty 1

## 2019-07-29 MED ORDER — SODIUM CHLORIDE 0.9 % IV SOLN
250.0000 mL | INTRAVENOUS | Status: DC | PRN
  Administered 2019-08-04: 20:00:00 250 mL via INTRAVENOUS

## 2019-07-29 MED ORDER — TORSEMIDE 20 MG PO TABS: 40.0000 mg | ORAL_TABLET | Freq: Every day | ORAL | Status: DC

## 2019-07-29 MED ORDER — LIRAGLUTIDE 18 MG/3ML ~~LOC~~ SOPN: 1.8000 mg | PEN_INJECTOR | Freq: Every day | SUBCUTANEOUS | Status: DC

## 2019-07-29 MED ORDER — ESCITALOPRAM OXALATE 10 MG PO TABS
10.0000 mg | ORAL_TABLET | Freq: Every day | ORAL | Status: DC
  Administered 2019-07-29 – 2019-08-04 (×7): 10 mg via ORAL
  Filled 2019-07-29 (×7): qty 1

## 2019-07-29 MED ORDER — ZOLPIDEM TARTRATE 5 MG PO TABS: 5.0000 mg | ORAL_TABLET | Freq: Every evening | ORAL | Status: DC | PRN

## 2019-07-29 MED ORDER — EZETIMIBE 10 MG PO TABS
10.0000 mg | ORAL_TABLET | Freq: Every day | ORAL | Status: DC
  Administered 2019-07-30 – 2019-08-05 (×6): 10 mg via ORAL
  Filled 2019-07-29 (×6): qty 1

## 2019-07-29 MED ORDER — ONDANSETRON HCL 4 MG/2ML IJ SOLN: 4.0000 mg | Freq: Four times a day (QID) | INTRAMUSCULAR | Status: DC | PRN

## 2019-07-29 MED ORDER — OXYCODONE HCL 5 MG PO TABS
10.0000 mg | ORAL_TABLET | ORAL | Status: AC
  Administered 2019-07-29: 18:00:00 10 mg via ORAL
  Filled 2019-07-29: qty 2

## 2019-07-29 NOTE — Progress Notes (Addendum)
Pharmacy Antibiotic Note  Joseph Hoffman is a 68 y.o. male admitted on 07/29/2019 with infection of left foot (pt is S/P left great toe amputation on 07/03/19).  Pt presented to vascular clinic yesterday with pain in amputation site, forefoot and ankle, with edema and erythema over same area. Pharmacy has been consulted for vancomycin dosing. Pt is also receiving ceftriaxone therapy (Zosyn originally ordered, but pt allergic to [facility]) and metronidazole (wife reported that wound was foul smelling).  Medical history includes: DM, a fib, CHF, mechanical heart valve (on warfarin)  WBC 9.9, afebrile, Scr 1.60 (BL appears to be ~1.3-1.4); CrCl 54.6 ml/min  Plan: Vancomycin 2000 mg IV X 1, followed by vancomycin 1750 mg IV Q 24 hrs (estimated vancomycin AUC on this regimen is 514; goal vancomycin AUC is 400-550) Monitor WBC, temp, clinical improvement, renal function    Temp (24hrs), Avg:98.2 F (36.8 C), Min:98.2 F (36.8 C), Max:98.2 F (36.8 C)   Estimated Creatinine Clearance: 54.6 mL/min (A) (by C-G formula based on SCr of 1.6 mg/dL (H)).    Allergies  Allergen Reactions  . Iohexol Anaphylaxis  . Niacin And Related     Flushing with immediate realese  . Penicillins Other (See Comments)    Unknown.[name]  Did it involve swelling of the face/tongue/throat, SOB, or low BP? Unknown Did it involve sudden or severe rash/hives, skin peeling, or any reaction on the inside of your mouth or nose? Unknown Did you need to seek medical attention at a hospital or doctor's office? Unknown When did it last happen?Childhood If all above answers are "NO", may proceed with cephalosporin use.   Antimicrobials This Admission: 8/26 ceftriaxone >> 8/26 metronidazole >>  Microbiology Results: 7/24 BCx: NG/final 7/31 Bone (L metatarsal, on Ancef): NG/final 7/29 MRSA PCR: negative  Thank you for allowing pharmacy to be a part of this patient's care.  Gillermina Hu, PharmD,  BCPS, [facility] Clinical Pharmacist 07/29/2019 5:43 PM

## 2019-07-29 NOTE — Progress Notes (Signed)
**Note Joseph-Identified via Obfuscation** Joseph Hoffman for Heparin  Indication: mechanical AVR, afib  Allergies  Allergen Reactions  . Iohexol Anaphylaxis  . Niacin And Related     Flushing with immediate realese  . Penicillins Other (See Comments)    Unknown.[name]  Did it involve swelling of the face/tongue/throat, SOB, or low BP? Unknown Did it involve sudden or severe rash/hives, skin peeling, or any reaction on the inside of your mouth or nose? Unknown Did you need to seek medical attention at a hospital or doctor's office? Unknown When did it last happen?Childhood If all above answers are "NO", may proceed with cephalosporin use.    Patient Measurements:   Heparin Dosing Weight: 100 kg  Vital Signs: Temp: 98.2 F (36.8 C) (08/26 1615) Temp Source: Oral (08/26 1615) BP: 125/54 (08/26 1615) Pulse Rate: 66 (08/26 1615)  Labs: Recent Labs    07/28/19  INR 2.7    Estimated Creatinine Clearance: 54.6 mL/min (A) (by C-G formula based on SCr of 1.6 mg/dL (H)).   Medications:  Scheduled:  . [START ON 07/30/2019] b complex vitamins  1 tablet Oral Q breakfast  . [START ON 07/30/2019] canagliflozin  100 mg Oral QAC breakfast  . [START ON 07/30/2019] cholecalciferol  1,000 Units Oral Q breakfast  . Chromium  500 mcg Oral QPM  . digoxin  0.125 mg Oral Daily  . escitalopram  10 mg Oral QHS  . ezetimibe  10 mg Oral Daily  . [START ON 07/30/2019] fenofibrate  160 mg Oral Q breakfast  . [START ON 07/30/2019] glimepiride  2 mg Oral Q breakfast  . Icosapent Ethyl  2 g Oral BID WC  . [START ON 07/30/2019] insulin aspart  0-20 Units Subcutaneous TID WC  . insulin aspart  0-5 Units Subcutaneous QHS  . insulin glargine  15-40 Units Subcutaneous See admin instructions  . liraglutide  1.8 mg Subcutaneous QHS  . metoprolol succinate  25 mg Oral BID  . mometasone-formoterol  2 puff Inhalation BID  . oxyCODONE  10 mg Oral STAT  . saccharomyces boulardii  250 mg Oral 3  times weekly  . sacubitril-valsartan  1 tablet Oral BID  . sodium chloride flush  3 mL Intravenous Q12H  . sodium chloride flush  3 mL Intravenous Q12H  . torsemide  20-40 mg Oral See admin instructions  . vitamin C  500 mg Oral BID   Infusions:  . sodium chloride      Assessment: 68 yo M on warfarin PTA for mechanical AVR + afib.  Warfarin is being held and bridge initiated with heparin pending toe amputation surgical evaluation.   Direct admit, admission labs pending. Patient previously required high dose of heparin to achieve therapeutic goals. INR was 2.7 at outpatient coumadin appointment yesterday, currently 2.4.   Warfarin dose PTA = 10mg  daily except 12.5mg  TTS  Goal of Therapy:  Heparin level 0.3-0.7 units/ml INR 2.5-3.5 Monitor platelets by anticoagulation protocol: Yes   Plan:  Start heparin at 1700 units/hr Check heparin level in 8 hours   Thank you for involving pharmacy in this patient's care.  Erin Hearing PharmD., BCPS Clinical Pharmacist 07/29/2019 5:29 PM  **Pharmacist phone directory can be found on [email or web address] listed under [place]**

## 2019-07-29 NOTE — Care Plan (Signed)
Direct admission [name] is a 68 year old male with past medical history significant for systolic CHF EF [id number], mechanical heart valve in need of replacement on chronic anticoagulation, DM type II, and recent great toe amputation; who presented to vascular surgery office today following great toe amputation.  Wound noted to be erythematous and swollen with purulent drainage.  Direct admission requested due to complicated medical history in need of bridging prior to surgery.  Accepted to a medical telemetry bed.

## 2019-07-29 NOTE — H&P (Addendum)
History and Physical    Joseph Hoffman [id number] DOB: [birth year] DOA: 07/29/2019  PCP: Orpah Melter, MD  Patient coming from: Home  I have personally briefly reviewed patient's old medical records in [place]  Chief Complaint: Left foot infection status post toe amputations  HPI: Joseph Hoffman is a 68 y.o. male with medical history significant of hypertension, diabetes mellitus type 2, asthma, systolic congestive heart failure with EF of 30 to 35%, coronary artery disease, atrial fibrillation on warfarin, aortic stenosis status post AVR with mechanical valve on Coumadin needing replacement, obstructive sleep apnea on CPAP, chronic kidney disease stage III, depression with anxiety who presented to the hospital today having been sent in by Dr. Ainsley Spinner office.  Went into the vascular office today for follow-up after his right amputation of left great toe by Dr. Carlis Abbott on 07/03/2019.  His wound was noted to be erythematous and swollen with purulent drainage.  He was referred to [facility] hospital[facility] for direct admission due to his complicated medical history.  Patient will need bridging heparin prior to surgery.  Unfortunately the patient awoke this morning and had some nausea and vomiting with associated diarrhea.  He states that whenever he gets antibiotics he gets diarrhea.  He has not had any diarrhea prior to today.  While I was examining him patient had a momentary episode of fecal urgency which resolved.  Patient is concerned that he will be getting antibiotics and will have diarrhea.  I have ordered stool studies given that he was on vancomycin and Rocephin less than a month ago. He denies chest pain, shortness of breath, cough, fever, sputum production, dysuria, urinary frequency, abdominal pain, palpitations, blurry vision, double vision, unilateral weakness or numbness.   Review of Systems: As per HPI otherwise all other systems reviewed and  negative.   Past Medical History:    Diagnosis Date   Anxiety    Aortic stenosis    Arthritis    Asthma    Asymptomatic LV dysfunction, EF 45%, normal coronary arteries on cardiac cath 09/18/12 09/30/2012   CHF (congestive heart failure) (HCC)    Chronic anticoagulation, on Xarelto prior to admit 09/30/2012   DM (diabetes mellitus) ([place]) 09/30/2012   Hepatitis 2003   Hypertension    Myocardial infarction [facility])    Permanent atrial fibrillation, since 1994 09/30/2012   stress test 02/08/12- normal study, no significant ischemia   S/P AVR (aortic valve replacement), 09/30/2012   a. s/p mechcanical AVR in 09/2012 (on Coumadin)   Sleep apnea    uses CPAP    Past Surgical History:  Procedure Laterality Date   ABDOMINAL ANGIOGRAM  09/18/2012   Procedure: ABDOMINAL ANGIOGRAM;  Surgeon: Troy Sine, MD;  Location[facility] CATH LAB;  Service: Cardiovascular;;   ABDOMINAL AORTOGRAM W/LOWER EXTREMITY N/A 07/02/2019   Procedure: ABDOMINAL AORTOGRAM W/LOWER EXTREMITY;  Surgeon: Marty Heck, MD;  Location: [place] CV LAB;  Service: Cardiovascular;  Laterality: N/A;   AMPUTATION Left 07/03/2019   Procedure: AMPUTATION LEFT GREAT TOE;  Surgeon: Marty Heck, MD;  Location: [place];  Service: Vascular;  Laterality: Left;   AORTIC VALVE REPLACEMENT  09/29/2012   Procedure: AORTIC VALVE REPLACEMENT (AVR);  Surgeon: Gaye Pollack, MD;  Location: [place];  Service: Open Heart Surgery;  Laterality: N/A;   ARCH AORTOGRAM  09/18/2012   Procedure: ARCH AORTOGRAM;  Surgeon: Troy Sine, MD;  Location[facility] CATH LAB;  Service: Cardiovascular;;   CARDIAC CATHETERIZATION  09/18/12   severe  calcific aortic stenosis, peak gradient 105mm, mean gradient 22mm, EF 45%   CARDIAC VALVE REPLACEMENT     AVR 09-29-12   CHOLECYSTECTOMY     FRACTURE SURGERY     LEFT AND RIGHT HEART CATHETERIZATION WITH CORONARY ANGIOGRAM N/A 09/18/2012   Procedure: LEFT AND RIGHT HEART CATHETERIZATION WITH CORONARY ANGIOGRAM;  Surgeon:  Troy Sine, MD;  Location: [facility] CATH LAB;  Service: Cardiovascular;  Laterality: N/A;   PERIPHERAL VASCULAR INTERVENTION Left 07/02/2019   Procedure: PERIPHERAL VASCULAR INTERVENTION;  Surgeon: Marty Heck, MD;  Location: [place] CV LAB;  Service: Cardiovascular;  Laterality: Left;   RIGHT HEART CATH AND CORONARY ANGIOGRAPHY N/A 06/29/2019   Procedure: RIGHT HEART CATH AND CORONARY ANGIOGRAPHY;  Surgeon: Nelva Bush, MD;  Location: [place] CV LAB;  Service: Cardiovascular;  Laterality: N/A;   TEE WITHOUT CARDIOVERSION N/A 05/29/2019   Procedure: TRANSESOPHAGEAL ECHOCARDIOGRAM (TEE);  Surgeon: Lelon Perla, MD;  Location: [facility] ENDOSCOPY;  Service: Cardiovascular;  Laterality: N/A;    Social History   Social History Narrative   Patient is married [name]) and lives with his wife and son.   Patient has three children.   Patient is working full-time.   Patient has a college education.   Patient is right handed.   Patient drinks about 2-3 cups of coffee daily.     reports that he quit smoking about 36 years ago. His smoking use included cigarettes. He has a 2.50 pack-year smoking history. He has quit using smokeless tobacco. He reports current alcohol use. He reports that he does not use drugs.  Allergies  Allergen Reactions   Iohexol Anaphylaxis   Niacin And Related     Flushing with immediate realese   Penicillins Other (See Comments)    Unknown.[name]  Did it involve swelling of the face/tongue/throat, SOB, or low BP? Unknown Did it involve sudden or severe rash/hives, skin peeling, or any reaction on the inside of your mouth or nose? Unknown Did you need to seek medical attention at a hospital or doctor's office? Unknown When did it last happen?Childhood If all above answers are NO, may proceed with cephalosporin use.    Family History  Problem Relation Age of Onset   Hypertension Mother    Diabetes Father    Heart  attack Brother 36   Hyperlipidemia Brother 50       stents placed     Prior to Admission medications   Medication Sig Start Date End Date Taking? Authorizing Provider  acetaminophen (TYLENOL) 325 MG tablet Take 2 tablets (650 mg total) by mouth every 4 (four) hours as needed for pain or fever. Patient taking differently: Take 650 mg by mouth every 4 (four) hours as needed for fever or headache (pain).  10/16/12  Yes Kilroy, Luke K, PA-C  albuterol (PROVENTIL HFA;VENTOLIN HFA) 108 (90 BASE) MCG/ACT inhaler Inhale 2 puffs into the lungs every 6 (six) hours as needed for wheezing or shortness of breath.    Yes [provider]  b complex vitamins tablet Take 1 tablet by mouth daily with breakfast.   Yes [provider]  cholecalciferol (VITAMIN D) 1000 UNITS tablet Take 1,000 Units by mouth daily with breakfast.    Yes [provider]  Chromium 500 MCG TABS Take 500 mcg by mouth every evening.   Yes [provider]  digoxin (LANOXIN) 0.125 MG tablet TAKE 1 TABLET EVERY DAY Patient taking differently: Take 0.125 mg by mouth daily.  01/21/19  Yes  Troy Sine, MD  empagliflozin (JARDIANCE) 25 MG TABS tablet Take 25 mg by mouth daily. 04/20/19  Yes Troy Sine, MD  escitalopram (LEXAPRO) 10 MG tablet Take 10 mg by mouth at bedtime.    Yes [provider]  ezetimibe (ZETIA) 10 MG tablet Take 1 tablet (10 mg total) by mouth daily. 02/09/19 07/26/22 Yes Almyra Deforest, PA  fenofibrate 160 MG tablet Take 1 tablet (160 mg total) by mouth daily. Patient taking differently: Take 160 mg by mouth daily with breakfast.  05/16/19  Yes Black, Lezlie Octave, NP  Fluticasone-Salmeterol (ADVAIR) 250-50 MCG/DOSE AEPB Inhale 1 puff into the lungs 2 (two) times daily as needed (shortness of breath/asthma related symptoms).    Yes [provider]  glimepiride (AMARYL) 2 MG tablet Take 2 mg by mouth daily with breakfast.  05/25/17  Yes [provider]    Glucosamine-Chondroit-Vit C-Mn (GLUCOSAMINE 1500 COMPLEX PO) Take 1 tablet by mouth daily as needed (arthritis flare up).    Yes [provider]  Icosapent Ethyl (VASCEPA) 1 g CAPS Take 2 capsules (2 g total) by mouth 2 (two) times daily. Patient taking differently: Take 2 g by mouth 2 (two) times daily with a meal.  04/20/19  Yes Troy Sine, MD  insulin glargine (LANTUS) 100 UNIT/ML injection Inject 15-40 Units into the skin See admin instructions. Inject 15 - 40 units subcutaneously daily before breakfast (adjusted per CBG); inject 30-40 units at bedtime ( occasionally adjusted per CBG - usually 35 units)   Yes [provider]  liraglutide (VICTOZA) 18 MG/3ML SOPN Inject 1.8 mg into the skin at bedtime.   Yes [provider]  metFORMIN (GLUCOPHAGE) 500 MG tablet Take 2,000 mg by mouth daily with supper.   Yes [provider]  metoprolol succinate (TOPROL XL) 25 MG 24 hr tablet Take 1 tablet (25 mg total) by mouth 2 (two) times daily. 07/10/19  Yes [place] Eric J, DO  nitroGLYCERIN (NITROSTAT) 0.4 MG SL tablet Place 1 tablet (0.4 mg total) under the tongue every 5 (five) minutes as needed for chest pain. Patient taking differently: Place 0.4 mg under the tongue every 5 (five) minutes x 3 doses as needed for chest pain.  05/30/19  Yes [place] Shi, MD  saccharomyces boulardii (FLORASTOR) 250 MG capsule Take 250 mg by mouth 3 (three) times a week.    Yes [provider]  sacubitril-valsartan (ENTRESTO) 24-26 MG Take 1 tablet by mouth 2 (two) times daily. 07/10/19  Yes [place] Eric J, DO  torsemide (DEMADEX) 20 MG tablet Take 2 tablets (40 mg total) by mouth every morning. Patient taking differently: Take 20-40 mg by mouth See admin instructions. Take 2 tablets (40 mg) by mouth daily in the morning & take 1 tablet (20 mg) by mouth at night 07/11/19  Yes [place] Donnamarie Poag, DO  vitamin C (ASCORBIC ACID) 500 MG tablet Take 500 mg by mouth 2 (two) times daily.    Yes [provider]  warfarin (COUMADIN) 10 MG tablet Take 10 mg by mouth at bedtime. Take 1 tablet (10 mg) by mouth with 0.5 tablet 5 mg (2.5 mg) on Tuesdays, Thursdays, & Saturdays; ONLY take 1 tablet (10 mg) by mouth on Sundays, Mondays, Wednesdays, & Fridays.   Yes [provider]  warfarin (COUMADIN) 5 MG tablet Take as instructed with 10 mg. Patient taking differently: Take 2.5 mg by mouth See admin instructions. Take 0.5 tablet (2.5 mg) by mouth with 1 tablet 10 mg (12.5  mg) on Tuesdays, Thursdays, & Saturdays at bedtime 12/11/18  Yes Troy Sine, MD  B-D ULTRAFINE III SHORT PEN 31G X 8 MM MISC  10/04/13   [provider]  EASY TOUCH LANCETS 30G/TWIST [place]  11/05/13   [provider]  enoxaparin (LOVENOX) 120 MG/0.8ML injection Inject 0.8 mLs (120 mg total) into the skin 2 (two) times daily for 5 days. Patient not taking: Reported on 07/29/2019 07/10/19 07/15/19  [place] Eric J, DO    Physical Exam:  Constitutional: NAD, calm, uncomfortable with severe left foot pain Vitals:   07/29/19 1615  BP: (!) 125/54  Pulse: 66  Resp: 20  Temp: 98.2 F (36.8 C)  TempSrc: Oral  SpO2: 100%   Eyes: PERRL, lids and conjunctivae normal ENMT: Mucous membranes are dry. Posterior pharynx clear of any exudate or lesions.Normal dentition.  Neck: normal, supple, no masses, no thyromegaly Respiratory: clear to auscultation bilaterally, no wheezing, no crackles. Normal respiratory effort. No accessory muscle use.  Cardiovascular: Regular rate and rhythm, no murmurs / rubs / gallops. No extremity edema. 2+ pedal pulses. No carotid bruits.  Abdomen: no tenderness, no masses palpated. No hepatosplenomegaly. Bowel sounds positive.  Musculoskeletal: no clubbing / cyanosis. No joint deformity upper and lower extremities. Good ROM, no contractures. Normal muscle tone.  Skin: no rashes, lesions. No induration.  Left leg with slight evidence of cellulitis with some erythema  below the shin and serosanguineous drainage coming from the dressing of his great toe amputation site Neurologic: CN 2-12 grossly intact. Sensation intact, DTR normal. Strength 5/5 in all 4.  Psychiatric: Normal judgment and insight. Alert and oriented x 3. Normal mood.    Labs on Admission: I have personally reviewed following labs and imaging studies  Coagulation Profile: Recent Labs  Lab 07/28/19  INR 2.7   CBG: Recent Labs  Lab 07/29/19 1610  GLUCAP 216*   Urine analysis:    Component Value Date/Time   COLORURINE YELLOW 09/25/2012 [place] 09/25/2012 1506   LABSPEC 1.045 (H) 09/25/2012 1506   PHURINE 6.0 09/25/2012 1506   GLUCOSEU >1000 (A) 09/25/2012 1506   HGBUR NEGATIVE 09/25/2012 1506   BILIRUBINUR NEGATIVE 09/25/2012 1506   KETONESUR NEGATIVE 09/25/2012 1506   PROTEINUR NEGATIVE 09/25/2012 1506   UROBILINOGEN 0.2 09/25/2012 1506   NITRITE NEGATIVE 09/25/2012 1506   LEUKOCYTESUR NEGATIVE 09/25/2012 1506    Radiological Exams on Admission: No results found.   Assessment/Plan Principal Problem:   Diabetic ulcer of left foot associated with diabetes mellitus due to underlying condition ([place]) Active Problems:   Chronic anticoagulation   H/O heart valve replacement with mechanical valve   Foot ulcer, left (HCC)   PAD (peripheral artery disease) (HCC)   Nausea & vomiting   Diarrhea   Left ventricular ejection fraction of 30% to 35%   Permanent atrial fibrillation, since 1994   CRI (chronic renal insufficiency), stage 3 (moderate) (HCC)   Type II diabetes mellitus with renal manifestations (HCC)   CKD (chronic kidney disease), stage III (HCC)   CAD, multiple vessel   OSA on CPAP   Essential hypertension    Impression: 1.  Foot ulcer of left foot associated with diabetes mellitus due to underlying peripheral artery disease: Patient did not heal well postoperatively.  He has underlying peripheral vascular disease and now is having pus  draining from the wound.  His foot is painful and Dr. Carlis Abbott has determined that he will require a below the knee amputation.  I am starting  him on vancomycin and Zosyn to treat any infection prior to surgery  @@@Addendum : Patient notes that he cannot tolerate penicillins and therefore ceftriaxone has been ordered instead of Rocephin.  2.  History of heart valve replacement with mechanical aortic valve on chronic anticoagulation also with permanent atrial fibrillation: Warfarin will be discontinued we will start him on a heparin drip to bridge him until surgery.  Postoperatively he will restart Coumadin.  He will likely need bridging for his warfarin postoperatively.  3.  Nausea vomiting and diarrhea: Is a new an acute problem for the patient.  He reports in the past that he has had significant diarrhea associated with antibiotics but has not had any antibiotics in the past month.  He stated that he awoke this morning with nausea and had an episode of vomitus as well.  I am concerned that he may have a viral gastroenteritis will check stool studies also check stool for C. difficile as he did receive vancomycin and ceftriaxone last hospitalization less than a month ago.  If stool studies are unremarkable will consider adding Imodium.  Patient already takes a probiotic.  Would recommend that he try adding yogurt to that.  4.  Systolic congestive heart failure with left ventricular ejection fraction of 30 to 35%: Noted.  Will avoid fluid volume overload.  Continue daily weights and monitor intake and output.  5.  Chronic renal insufficiency stage III: Noted avoid nephrotoxic agents.  6.  Coronary artery disease of multiple vessels: Patient not only needs an aortic valve replacement but he tells me he is in need of a two-vessel bypass when his valve is replaced.  Plan is to treat his underlying foot infection first and then proceed with valve replacement and CABG at a later date.  7.  Obstructive sleep  apnea on CPAP: We will ask patient to bring in his machine and use home settings.  8.  Essential hypertension: Noted blood pressure management as per home medications.  DVT prophylaxis: IV heparin Code Status: Full code Family Communication: Patient has discussed with his wife.  She was present at the visit with Dr. Carlis Abbott. Disposition Plan: Likely home in approximately a week Consults called: Dr. Carlis Abbott from vascular surgery Admission status: Admit - It is my clinical opinion that admission to INPATIENT is reasonable and necessary because of the expectation that this patient will require hospital care that crosses at least 2 midnights to treat this condition based on the medical complexity of the problems presented.  Given the aforementioned information, the predictability of an adverse outcome is felt to be significant. Pt will require IV heparin for bridge prior to planned surgery for foot infection due to presence of mechanical Aortic valve    Lady Deutscher MD FACP [facility] Pager [phone]  How to contact the [facility] Attending or Consulting provider [place] or covering provider during after hours [place], for this patient?  1. Check the care team in [facility] and look for a) attending/consulting [facility] provider listed and b) the [facility] team listed 2. Log into [email or web address] and use [place]'s universal password to access. If you do not have the password, please contact the hospital operator. 3. Locate the [facility] provider you are looking for under [facility] and page to a number that you can be directly reached. 4. If you still have difficulty reaching the provider, please page the [facility] (Director on Call) for the Hospitalists listed on amion for assistance.  If 7PM-7AM, please contact night-coverage  [email or web address] Password [facility]  07/29/2019, 5:40 PM

## 2019-07-29 NOTE — Progress Notes (Signed)
Pt set up on BIPAP auto titrate patient requests nasal mask he states he can not tolerate full face mask at this time.  Patient tolerating well at this time.

## 2019-07-30 DIAGNOSIS — R197 Diarrhea, unspecified: Secondary | ICD-10-CM

## 2019-07-30 DIAGNOSIS — Z952 Presence of prosthetic heart valve: Secondary | ICD-10-CM

## 2019-07-30 DIAGNOSIS — Z7901 Long term (current) use of anticoagulants: Secondary | ICD-10-CM

## 2019-07-30 DIAGNOSIS — N183 Chronic kidney disease, stage 3 (moderate): Secondary | ICD-10-CM

## 2019-07-30 DIAGNOSIS — I10 Essential (primary) hypertension: Secondary | ICD-10-CM

## 2019-07-30 DIAGNOSIS — I2510 Atherosclerotic heart disease of native coronary artery without angina pectoris: Secondary | ICD-10-CM

## 2019-07-30 DIAGNOSIS — R112 Nausea with vomiting, unspecified: Secondary | ICD-10-CM

## 2019-07-30 DIAGNOSIS — Z9989 Dependence on other enabling machines and devices: Secondary | ICD-10-CM

## 2019-07-30 DIAGNOSIS — I739 Peripheral vascular disease, unspecified: Secondary | ICD-10-CM

## 2019-07-30 DIAGNOSIS — I4821 Permanent atrial fibrillation: Secondary | ICD-10-CM

## 2019-07-30 DIAGNOSIS — R9430 Abnormal result of cardiovascular function study, unspecified: Secondary | ICD-10-CM

## 2019-07-30 DIAGNOSIS — G4733 Obstructive sleep apnea (adult) (pediatric): Secondary | ICD-10-CM

## 2019-07-30 DIAGNOSIS — E1122 Type 2 diabetes mellitus with diabetic chronic kidney disease: Secondary | ICD-10-CM

## 2019-07-30 LAB — GLUCOSE, CAPILLARY
Glucose-Capillary: 172 mg/dL — ABNORMAL HIGH (ref 70–99)
Glucose-Capillary: 220 mg/dL — ABNORMAL HIGH (ref 70–99)
Glucose-Capillary: 242 mg/dL — ABNORMAL HIGH (ref 70–99)
Glucose-Capillary: 204 mg/dL — ABNORMAL HIGH (ref 70–99)
Glucose-Capillary: 172 mg/dL — ABNORMAL HIGH (ref 70–99)

## 2019-07-30 LAB — PROTIME-INR
INR: 2.5 — ABNORMAL HIGH (ref 0.8–1.2)
Prothrombin Time: 26.6 seconds — ABNORMAL HIGH (ref 11.4–15.2)

## 2019-07-30 LAB — BASIC METABOLIC PANEL
Anion gap: 12 (ref 5–15)
BUN: 60 mg/dL — ABNORMAL HIGH (ref 8–23)
CO2: 24 mmol/L (ref 22–32)
Calcium: 9.3 mg/dL (ref 8.9–10.3)
Chloride: 99 mmol/L (ref 98–111)
Creatinine, Ser: 1.86 mg/dL — ABNORMAL HIGH (ref 0.61–1.24)
GFR calc Af Amer: 42 mL/min — ABNORMAL LOW (ref >60)
GFR calc non Af Amer: 36 mL/min — ABNORMAL LOW (ref >60)
Glucose, Bld: 182 mg/dL — ABNORMAL HIGH (ref 70–99)
Potassium: 4.3 mmol/L (ref 3.5–5.1)
Sodium: 135 mmol/L (ref 135–145)

## 2019-07-30 LAB — CBC
HCT: 36.3 % — ABNORMAL LOW (ref 39.0–52.0)
Hemoglobin: 11.4 g/dL — ABNORMAL LOW (ref 13.0–17.0)
MCH: 26.1 pg (ref 26.0–34.0)
MCHC: 31.4 g/dL (ref 30.0–36.0)
MCV: 83.3 fL (ref 80.0–100.0)
Platelets: 227 10*3/uL (ref 150–400)
RBC: 4.36 MIL/uL (ref 4.22–5.81)
RDW: 17.7 % — ABNORMAL HIGH (ref 11.5–15.5)
WBC: 9.7 10*3/uL (ref 4.0–10.5)
nRBC: 0 % (ref 0.0–0.2)

## 2019-07-30 LAB — HEPARIN LEVEL (UNFRACTIONATED)
Heparin Unfractionated: 0.18 IU/mL — ABNORMAL LOW (ref 0.30–0.70)
Heparin Unfractionated: 0.23 IU/mL — ABNORMAL LOW (ref 0.30–0.70)

## 2019-07-30 LAB — APTT: aPTT: 100 seconds — ABNORMAL HIGH (ref 24–36)

## 2019-07-30 LAB — SURGICAL PCR SCREEN
MRSA, PCR: NEGATIVE
Staphylococcus aureus: NEGATIVE

## 2019-07-30 MED ORDER — VITAMIN K1 10 MG/ML IJ SOLN
5.0000 mg | Freq: Once | INTRAVENOUS | Status: AC
  Administered 2019-07-30: 13:00:00 5 mg via INTRAVENOUS
  Filled 2019-07-30: qty 0.5

## 2019-07-30 MED ORDER — MORPHINE SULFATE (PF) 2 MG/ML IV SOLN
0.5000 mg | INTRAVENOUS | Status: DC | PRN
  Administered 2019-07-30: 19:00:00 0.5 mg via INTRAVENOUS
  Filled 2019-07-30: qty 1

## 2019-07-30 NOTE — Plan of Care (Signed)
  Problem: Activity: Goal: Risk for activity intolerance will decrease Outcome: Progressing   

## 2019-07-30 NOTE — Progress Notes (Signed)
PROGRESS NOTE  [name] [id number] DOB: [birth year] DOA: 07/29/2019 PCP: Orpah Melter, MD  Brief History   The patient presented to [facility] with complaints of infection at the site of his recent amputations of toes on the left foot.  Joseph Hoffman is a 68 y.o. male with medical history significant of hypertension, diabetes mellitus type 2, asthma, systolic congestive heart failure with EF of 30 to 35%, coronary artery disease, atrial fibrillation on warfarin, aortic stenosis status post AVR with mechanical valve on Coumadin needing replacement, obstructive sleep apnea on CPAP, chronic kidney disease stage III, depression with anxiety who presented to the hospital today having been sent in by Dr. Ainsley Spinner office.  Went into the vascular office today for follow-up after his right amputation of left great toe by Dr. Carlis Abbott on 07/03/2019.  His wound was noted to be erythematous and swollen with purulent drainage.  He was referred to [facility] for direct admission due to his complicated medical history.  Patient will need bridging heparin prior to surgery.  Unfortunately the patient awoke this morning and had some nausea and vomiting with associated diarrhea.  He states that whenever he gets antibiotics he gets diarrhea.  He has not had any diarrhea prior to today.  While I was examining him patient had a momentary episode of fecal urgency which resolved.  Patient is concerned that he will be getting antibiotics and will have diarrhea.  I have ordered stool studies given that he was on vancomycin and Rocephin less than a month ago. He denies chest pain, shortness of breath, cough, fever, sputum production, dysuria, urinary frequency, abdominal pain, palpitations, blurry vision, double vision, unilateral weakness or numbness.  The patient was admitted to a telemetry bed. Vascular surgery is consulted. He was started on IV Vancomycin. And has been continued on his home  medications as possible.  Consultants  . Vascular surgery  Procedures  . None  Antibiotics   Anti-infectives (From admission, onward)   Start     Dose/Rate Route Frequency Ordered Stop   07/30/19 1800  vancomycin (VANCOCIN) 1,750 mg in sodium chloride 0.9 % 500 mL IVPB     1,750 mg 250 mL/hr over 120 Minutes Intravenous Every 24 hours 07/29/19 1757     07/29/19 1900  metroNIDAZOLE (FLAGYL) IVPB 500 mg     500 mg 100 mL/hr over 60 Minutes Intravenous Every 8 hours 07/29/19 1807     07/29/19 1830  cefTRIAXone (ROCEPHIN) 1 g in sodium chloride 0.9 % 100 mL IVPB     1 g 200 mL/hr over 30 Minutes Intravenous Every 24 hours 07/29/19 1803     07/29/19 1800  vancomycin (VANCOCIN) 2,000 mg in sodium chloride 0.9 % 500 mL IVPB     2,000 mg 250 mL/hr over 120 Minutes Intravenous  Once 07/29/19 1757 07/29/19 2255    .  Subjective  The patient is resting comfortably. No new complaints.  Objective   Vitals:  Vitals:   07/30/19 0350 07/30/19 1346  BP: (!) 119/49 (!) 105/55  Pulse: 78 69  Resp: 20 18  Temp: 98.3 F (36.8 C) 97.6 F (36.4 C)  SpO2: 100% 99%    Exam:  Constitutional:  . The patient is awake, alert, and oriented x 3.  Respiratory:  . No increased work of breathing.  . No wheezes, rales, or rhonchi.  . No tactile fremitus Cardiovascular:  . Regular rate and rhythm. . No murmurs, ectopy, or gallups. . No lateral PMI.  No thrills. Abdomen:  . Abdomen is soft, non-tender, non-distended. . No hernias, masses, or organomegaly . Normoactive bowel sounds Musculoskeletal:  . No cyanosis, clubbing or edema of right lower extremity and foot. Left foot /great toe is bandaged. Skin:  . No rashes, lesions, ulcers . palpation of skin: no induration or nodules Neurologic:  . CN 2-12 intact . Sensation all 4 extremities intact Psychiatric:  . Mental status o Mood, affect appropriate o Orientation to person, place, time  . judgment and insight appear intact     I  have personally reviewed the following:   Today's Data  . Vitals, BMP, CBC  Scheduled Meds: . B-complex with vitamin C  1 tablet Oral Daily  . cholecalciferol  1,000 Units Oral Q breakfast  . digoxin  0.125 mg Oral Daily  . escitalopram  10 mg Oral QHS  . ezetimibe  10 mg Oral Daily  . fenofibrate  160 mg Oral Q breakfast  . glimepiride  2 mg Oral Q breakfast  . insulin aspart  0-20 Units Subcutaneous TID WC  . insulin aspart  0-5 Units Subcutaneous QHS  . insulin glargine  30 Units Subcutaneous BID  . metoprolol succinate  25 mg Oral BID  . mometasone-formoterol  2 puff Inhalation BID  . saccharomyces boulardii  250 mg Oral 3 times weekly  . sacubitril-valsartan  1 tablet Oral BID  . sodium chloride flush  3 mL Intravenous Q12H  . sodium chloride flush  3 mL Intravenous Q12H  . torsemide  40 mg Oral Daily   And  . torsemide  20 mg Oral Daily  . vitamin C  500 mg Oral BID   Continuous Infusions: . sodium chloride    . cefTRIAXone (ROCEPHIN)  IV Stopped (07/30/19 0056)  . heparin 1,850 Units/hr (07/30/19 1231)  . metronidazole 500 mg (07/30/19 1422)  . vancomycin      Principal Problem:   Diabetic ulcer of left foot associated with diabetes mellitus due to underlying condition [facility]) Active Problems:   Permanent atrial fibrillation, since 1994   OSA on CPAP   Chronic anticoagulation   Essential hypertension   CRI (chronic renal insufficiency), stage 3 (moderate) (HCC)   Type II diabetes mellitus with renal manifestations (HCC)   CKD (chronic kidney disease), stage III (HCC)   H/O heart valve replacement with mechanical valve   Foot ulcer, left (HCC)   CAD, multiple vessel   PAD (peripheral artery disease) (HCC)   Nausea & vomiting   Diarrhea   Left ventricular ejection fraction of 30% to 35%   LOS: 1 day   A & P   Foot ulcer of left foot associated with diabetes mellitus due to underlying peripheral artery disease: Patient did not heal well postoperatively.  He  has underlying peripheral vascular disease and now is having pus draining from the wound.  His foot is painful and Dr. Carlis Abbott has determined that he will require a below the knee amputation.  I am starting him on vancomycin and Ceftriaxone  to treat any infection prior to surgery. Plan is for surgery tomorrow.   History of heart valve replacement with mechanical aortic valve on chronic anticoagulation also with permanent atrial fibrillation: Warfarin will be discontinued we will start him on a heparin drip to bridge him until surgery.  Postoperatively he will restart Coumadin.  He will likely need bridgeing for his warfarin postoperatively.  Nausea vomiting and diarrhea: Resolved per patient.  I am concerned that he may have a viral  gastroenteritis will check stool studies also check stool for C. difficile as he did receive vancomycin and ceftriaxone last hospitalization less than a month ago. However, the tests have not been performed, because the patient has not produced any stool.  Patient already takes a probiotic.  Would recommend that he try adding yogurt to that. Monitor.  Systolic congestive heart failure with left ventricular ejection fraction of 30 to 35%: Noted. Fluid balance negative by 300cc since admission.  Chronic renal insufficiency stage III: Noted avoid nephrotoxic agents or hypotension. Creatinine is stable at 1.86. Monitor creatinine electrolyes, and volume status.  Coronary artery disease of multiple vessels: Patient not only needs an aortic valve replacement but he tells me he is in need of a two-vessel bypass when his valve is replaced.  Plan is to treat his underlying foot infection first and then proceed with valve replacement and CABG at a later date.  Obstructive sleep apnea on CPAP: We will ask patient to bring in his machine and use home settings.  Essential hypertension: Noted blood pressure management as per home medications.  I have seen and examined this  patient myself. I have spent 35 minutes in his evaluation and care.  DVT prophylaxis: IV heparin Code Status: Full code Family Communication: None at bedside. Disposition Plan: Likely home in approximately a week  Marcelyn Ruppe, DO [facility] Direct contact: see [email or web address]  7PM-7AM contact night coverage as above 07/30/2019, 3:55 PM  LOS: 1 day

## 2019-07-30 NOTE — Progress Notes (Signed)
[facility]  Subjective  - No complaints.   Objective (!) 119/49 78 98.3 F (36.8 C) (Oral) 20 100%  Intake/Output Summary (Last 24 hours) at 07/30/2019 0744 Last data filed at 07/30/2019 0700 Gross per 24 hour  Intake 953.73 ml  Output 1200 ml  Net -246.27 ml    Left great toe amp melting with foot cellulitis  Laboratory Lab Results: Recent Labs    07/29/19 1726 07/30/19 0619  WBC 14.3* 9.7  HGB 12.0* 11.4*  HCT 38.2* 36.3*  PLT 239 227   BMET Recent Labs    07/29/19 1726 07/30/19 0619  NA 133* 135  K 5.4* 4.3  CL 98 99  CO2 21* 24  GLUCOSE 234* 182*  BUN 62* 60*  CREATININE 1.98* 1.86*  CALCIUM 9.3 9.3    COAG Lab Results  Component Value Date   INR 2.5 (H) 07/30/2019   INR 2.4 (H) 07/29/2019   INR 2.7 07/28/2019   No results found for: PTT  Assessment/Planning:  Plan for left BKA tomorrow.  Please hold coumadin.  NPO after midnight.  Need INR closer to 1.5 if possible.  Can use heparin bridge and hold on call to OR.    Marty Heck 07/30/2019 7:44 AM --

## 2019-07-30 NOTE — Progress Notes (Signed)
Went into patient's room around 1800 to give meds. Found patient shaking in the bed.  I asked the patient what was wrong. Patient stated that he was cold and started shaking after he came out from under the blanket. Took a temperature on the patient and it was 98.7. BP was 143/80, O2 sats around 94 and HR 80s-90s. Paged Rapid Response and Dr. Benny Lennert. RR at bedside took manual BP and temp it was 160/70 and 98.5 orally. Pt continued to have rigors and he was covered with blankets. Dr. Benny Lennert returned my page and gave orders for IV morphine and to draw blood cultures x2. While I was in the room, I hung patient's scheduled antibiotic and took a blood sugar which was 204. Gave 0.5mg  of morphine. Pt resting comfortably. Will Continue to monitor.   Albertina Senegal E

## 2019-07-30 NOTE — Progress Notes (Signed)
ANTICOAGULATION CONSULT NOTE - Follow Up Consult  Pharmacy Consult for Heparin  Indication: atrial fibrillation and mechanical AVR  Allergies  Allergen Reactions  . Iohexol Anaphylaxis  . Niacin And Related     Flushing with immediate realese  . Penicillins Other (See Comments)    Unknown.[name]  Did it involve swelling of the face/tongue/throat, SOB, or low BP? Unknown Did it involve sudden or severe rash/hives, skin peeling, or any reaction on the inside of your mouth or nose? Unknown Did you need to seek medical attention at a hospital or doctor's office? Unknown When did it last happen?Childhood If all above answers are "NO", may proceed with cephalosporin use.    Patient Measurements: Height: 5\' 11"  (180.3 cm)(recorded on 07/28/19) Weight: 244 lb 7.8 oz (110.9 kg) IBW/kg (Calculated) : 75.3 Heparin Dosing Weight: 99.2 kg  Vital Signs: Temp: 98.3 F (36.8 C) (08/27 0350) Temp Source: Oral (08/27 0350) BP: 119/49 (08/27 0350) Pulse Rate: 78 (08/27 0350)  Labs: Recent Labs    07/28/19 07/29/19 1726 07/30/19 0619  HGB  --  12.0* 11.4*  HCT  --  38.2* 36.3*  PLT  --  239 227  APTT  --  48* 100*  LABPROT  --  25.6* 26.6*  INR 2.7 2.4* 2.5*  HEPARINUNFRC  --   --  0.18*  CREATININE  --  1.98* 1.86*    Estimated Creatinine Clearance: 46.8 mL/min (A) (by C-G formula based on SCr of 1.86 mg/dL (H)).   Medications:  Medications Prior to Admission  Medication Sig Dispense Refill Last Dose  . acetaminophen (TYLENOL) 325 MG tablet Take 2 tablets (650 mg total) by mouth every 4 (four) hours as needed for pain or fever. (Patient taking differently: Take 650 mg by mouth every 4 (four) hours as needed for fever or headache (pain). )     . albuterol (PROVENTIL HFA;VENTOLIN HFA) 108 (90 BASE) MCG/ACT inhaler Inhale 2 puffs into the lungs every 6 (six) hours as needed for wheezing or shortness of breath.      [name] b complex vitamins tablet Take 1 tablet by  mouth daily with breakfast.     . cholecalciferol (VITAMIN D) 1000 UNITS tablet Take 1,000 Units by mouth daily with breakfast.      . Chromium 500 MCG TABS Take 500 mcg by mouth every evening.     . digoxin (LANOXIN) 0.125 MG tablet TAKE 1 TABLET EVERY DAY (Patient taking differently: Take 0.125 mg by mouth daily. ) 90 tablet 1   . empagliflozin (JARDIANCE) 25 MG TABS tablet Take 25 mg by mouth daily. 90 tablet 1   . escitalopram (LEXAPRO) 10 MG tablet Take 10 mg by mouth at bedtime.      [name] ezetimibe (ZETIA) 10 MG tablet Take 1 tablet (10 mg total) by mouth daily. 90 tablet 3   . fenofibrate 160 MG tablet Take 1 tablet (160 mg total) by mouth daily. (Patient taking differently: Take 160 mg by mouth daily with breakfast. ) 30 tablet 1   . Fluticasone-Salmeterol (ADVAIR) 250-50 MCG/DOSE AEPB Inhale 1 puff into the lungs 2 (two) times daily as needed (shortness of breath/asthma related symptoms).      [name] glimepiride (AMARYL) 2 MG tablet Take 2 mg by mouth daily with breakfast.      . Glucosamine-Chondroit-Vit C-Mn (GLUCOSAMINE 1500 COMPLEX PO) Take 1 tablet by mouth daily as needed (arthritis flare up).      Vanessa Kick Ethyl (VASCEPA) 1 g CAPS Take 2  capsules (2 g total) by mouth 2 (two) times daily. (Patient taking differently: Take 2 g by mouth 2 (two) times daily with a meal. ) 360 capsule 3   . insulin glargine (LANTUS) 100 UNIT/ML injection Inject 15-40 Units into the skin See admin instructions. Inject 15 - 40 units subcutaneously daily before breakfast (adjusted per CBG); inject 30-40 units at bedtime ( occasionally adjusted per CBG - usually 35 units)     . liraglutide (VICTOZA) 18 MG/3ML SOPN Inject 1.8 mg into the skin at bedtime.     . metFORMIN (GLUCOPHAGE) 500 MG tablet Take 2,000 mg by mouth daily with supper.     . metoprolol succinate (TOPROL XL) 25 MG 24 hr tablet Take 1 tablet (25 mg total) by mouth 2 (two) times daily. 90 tablet 1   . nitroGLYCERIN (NITROSTAT) 0.4 MG SL tablet Place  1 tablet (0.4 mg total) under the tongue every 5 (five) minutes as needed for chest pain. (Patient taking differently: Place 0.4 mg under the tongue every 5 (five) minutes x 3 doses as needed for chest pain. ) 30 tablet 2   . saccharomyces boulardii (FLORASTOR) 250 MG capsule Take 250 mg by mouth 3 (three) times a week.      . sacubitril-valsartan (ENTRESTO) 24-26 MG Take 1 tablet by mouth 2 (two) times daily. 60 tablet 3   . torsemide (DEMADEX) 20 MG tablet Take 2 tablets (40 mg total) by mouth every morning. (Patient taking differently: Take 20-40 mg by mouth See admin instructions. Take 2 tablets (40 mg) by mouth daily in the morning & take 1 tablet (20 mg) by mouth at night) 60 tablet 3   . vitamin C (ASCORBIC ACID) 500 MG tablet Take 500 mg by mouth 2 (two) times daily.     [name] warfarin (COUMADIN) 10 MG tablet Take 10 mg by mouth at bedtime. Take 1 tablet (10 mg) by mouth with 0.5 tablet 5 mg (2.5 mg) on Tuesdays, Thursdays, & Saturdays; ONLY take 1 tablet (10 mg) by mouth on Sundays, Mondays, Wednesdays, & Fridays.     [name] warfarin (COUMADIN) 5 MG tablet Take as instructed with 10 mg. (Patient taking differently: Take 2.5 mg by mouth See admin instructions. Take 0.5 tablet (2.5 mg) by mouth with 1 tablet 10 mg (12.5 mg) on Tuesdays, Thursdays, & Saturdays at bedtime) 90 tablet 1   . B-D ULTRAFINE III SHORT PEN 31G X 8 MM MISC      . EASY TOUCH LANCETS 30G/TWIST MISC      . enoxaparin (LOVENOX) 120 MG/0.8ML injection Inject 0.8 mLs (120 mg total) into the skin 2 (two) times daily for 5 days. (Patient not taking: Reported on 07/29/2019) 8 mL 0 Not Taking at Unknown time   Scheduled:  . B-complex with vitamin C  1 tablet Oral Daily  . cholecalciferol  1,000 Units Oral Q breakfast  . digoxin  0.125 mg Oral Daily  . escitalopram  10 mg Oral QHS  . ezetimibe  10 mg Oral Daily  . fenofibrate  160 mg Oral Q breakfast  . glimepiride  2 mg Oral Q breakfast  . insulin aspart  0-20 Units Subcutaneous TID WC   . insulin aspart  0-5 Units Subcutaneous QHS  . insulin glargine  30 Units Subcutaneous BID  . metoprolol succinate  25 mg Oral BID  . mometasone-formoterol  2 puff Inhalation BID  . saccharomyces boulardii  250 mg Oral 3 times weekly  . sacubitril-valsartan  1 tablet Oral BID  .  sodium chloride flush  3 mL Intravenous Q12H  . sodium chloride flush  3 mL Intravenous Q12H  . torsemide  40 mg Oral Daily   And  . torsemide  20 mg Oral Daily  . vitamin C  500 mg Oral BID    Assessment: 68 yo M on warfarin PTA for mechanical AVR + afib.  Warfarin is being held and bridge initiated with heparin pending toe amputation surgical evaluation.   Direct admit, admission labs pending. Patient previously required high dose of heparin to achieve therapeutic goals. INR was 2.7 at outpatient coumadin appointment on 8/25. Today INR is 2.5,  Warfarin remains on hold.  Plan for left BKA tomorrow 8/28. Heparin level is 0.18 on heparin drip rate 1700 units/hr , INR of 2.5 is at therapeutic range currently off warfarin.  H/H stable with Hgb 11.4 and pltc wnl/stable. No bleeding noted.  Warfarin dose PTA = 10mg  daily except 12.5mg  TTS  Goal of Therapy:  INR 2-3 Heparin level 0.3-0.7 units/ml Monitor platelets by anticoagulation protocol: Yes   Plan:  Increase heparin rate to 1850 units/hr Check heparin level in 6 hours Warfarin remains on hold.  Plan for left BKA tomorrow 8/28. Daily HL, INR, and CBC   Thank you for involving pharmacy in this patient's care.  Nicole Cella, RPh Clinical Pharmacist [phone] Please check AMION for all [place] phone numbers After 10:00 PM, call [place] [phone] 07/30/2019,8:49 AM

## 2019-07-30 NOTE — Telephone Encounter (Signed)
This encounter was created in error - please disregard.

## 2019-07-30 NOTE — Progress Notes (Addendum)
ANTICOAGULATION CONSULT NOTE - Follow Up Consult  Pharmacy Consult for Heparin  Indication: atrial fibrillation and mechanical AVR  Allergies  Allergen Reactions  . Iohexol Anaphylaxis  . Niacin And Related     Flushing with immediate realese  . Penicillins Other (See Comments)    Unknown.[name]  Did it involve swelling of the face/tongue/throat, SOB, or low BP? Unknown Did it involve sudden or severe rash/hives, skin peeling, or any reaction on the inside of your mouth or nose? Unknown Did you need to seek medical attention at a hospital or doctor's office? Unknown When did it last happen?Childhood If all above answers are "NO", may proceed with cephalosporin use.    Patient Measurements: Height: 5\' 11"  (180.3 cm)(recorded on 07/28/19) Weight: 244 lb 7.8 oz (110.9 kg) IBW/kg (Calculated) : 75.3 Heparin Dosing Weight: 99.2 kg  Vital Signs: Temp: 97.6 F (36.4 C) (08/27 1346) Temp Source: Oral (08/27 1346) BP: 105/55 (08/27 1346) Pulse Rate: 69 (08/27 1346)  Labs: Recent Labs    07/28/19 07/29/19 1726 07/30/19 0619 07/30/19 1535  HGB  --  12.0* 11.4*  --   HCT  --  38.2* 36.3*  --   PLT  --  239 227  --   APTT  --  48* 100*  --   LABPROT  --  25.6* 26.6*  --   INR 2.7 2.4* 2.5*  --   HEPARINUNFRC  --   --  0.18* 0.23*  CREATININE  --  1.98* 1.86*  --     Estimated Creatinine Clearance: 46.8 mL/min (A) (by C-G formula based on SCr of 1.86 mg/dL (H)).   Medications:  Medications Prior to Admission  Medication Sig Dispense Refill Last Dose  . acetaminophen (TYLENOL) 325 MG tablet Take 2 tablets (650 mg total) by mouth every 4 (four) hours as needed for pain or fever. (Patient taking differently: Take 650 mg by mouth every 4 (four) hours as needed for fever or headache (pain). )     . albuterol (PROVENTIL HFA;VENTOLIN HFA) 108 (90 BASE) MCG/ACT inhaler Inhale 2 puffs into the lungs every 6 (six) hours as needed for wheezing or shortness of  breath.      [name] b complex vitamins tablet Take 1 tablet by mouth daily with breakfast.     . cholecalciferol (VITAMIN D) 1000 UNITS tablet Take 1,000 Units by mouth daily with breakfast.      . Chromium 500 MCG TABS Take 500 mcg by mouth every evening.     . digoxin (LANOXIN) 0.125 MG tablet TAKE 1 TABLET EVERY DAY (Patient taking differently: Take 0.125 mg by mouth daily. ) 90 tablet 1   . empagliflozin (JARDIANCE) 25 MG TABS tablet Take 25 mg by mouth daily. 90 tablet 1   . escitalopram (LEXAPRO) 10 MG tablet Take 10 mg by mouth at bedtime.      [name] ezetimibe (ZETIA) 10 MG tablet Take 1 tablet (10 mg total) by mouth daily. 90 tablet 3   . fenofibrate 160 MG tablet Take 1 tablet (160 mg total) by mouth daily. (Patient taking differently: Take 160 mg by mouth daily with breakfast. ) 30 tablet 1   . Fluticasone-Salmeterol (ADVAIR) 250-50 MCG/DOSE AEPB Inhale 1 puff into the lungs 2 (two) times daily as needed (shortness of breath/asthma related symptoms).      [name] glimepiride (AMARYL) 2 MG tablet Take 2 mg by mouth daily with breakfast.      . Glucosamine-Chondroit-Vit C-Mn (GLUCOSAMINE 1500 COMPLEX PO) Take  1 tablet by mouth daily as needed (arthritis flare up).      Vanessa Kick Ethyl (VASCEPA) 1 g CAPS Take 2 capsules (2 g total) by mouth 2 (two) times daily. (Patient taking differently: Take 2 g by mouth 2 (two) times daily with a meal. ) 360 capsule 3   . insulin glargine (LANTUS) 100 UNIT/ML injection Inject 15-40 Units into the skin See admin instructions. Inject 15 - 40 units subcutaneously daily before breakfast (adjusted per CBG); inject 30-40 units at bedtime ( occasionally adjusted per CBG - usually 35 units)     . liraglutide (VICTOZA) 18 MG/3ML SOPN Inject 1.8 mg into the skin at bedtime.     . metFORMIN (GLUCOPHAGE) 500 MG tablet Take 2,000 mg by mouth daily with supper.     . metoprolol succinate (TOPROL XL) 25 MG 24 hr tablet Take 1 tablet (25 mg total) by mouth 2 (two) times daily. 90  tablet 1   . nitroGLYCERIN (NITROSTAT) 0.4 MG SL tablet Place 1 tablet (0.4 mg total) under the tongue every 5 (five) minutes as needed for chest pain. (Patient taking differently: Place 0.4 mg under the tongue every 5 (five) minutes x 3 doses as needed for chest pain. ) 30 tablet 2   . saccharomyces boulardii (FLORASTOR) 250 MG capsule Take 250 mg by mouth 3 (three) times a week.      . sacubitril-valsartan (ENTRESTO) 24-26 MG Take 1 tablet by mouth 2 (two) times daily. 60 tablet 3   . torsemide (DEMADEX) 20 MG tablet Take 2 tablets (40 mg total) by mouth every morning. (Patient taking differently: Take 20-40 mg by mouth See admin instructions. Take 2 tablets (40 mg) by mouth daily in the morning & take 1 tablet (20 mg) by mouth at night) 60 tablet 3   . vitamin C (ASCORBIC ACID) 500 MG tablet Take 500 mg by mouth 2 (two) times daily.     [name] warfarin (COUMADIN) 10 MG tablet Take 10 mg by mouth at bedtime. Take 1 tablet (10 mg) by mouth with 0.5 tablet 5 mg (2.5 mg) on Tuesdays, Thursdays, & Saturdays; ONLY take 1 tablet (10 mg) by mouth on Sundays, Mondays, Wednesdays, & Fridays.     [name] warfarin (COUMADIN) 5 MG tablet Take as instructed with 10 mg. (Patient taking differently: Take 2.5 mg by mouth See admin instructions. Take 0.5 tablet (2.5 mg) by mouth with 1 tablet 10 mg (12.5 mg) on Tuesdays, Thursdays, & Saturdays at bedtime) 90 tablet 1   . B-D ULTRAFINE III SHORT PEN 31G X 8 MM MISC      . EASY TOUCH LANCETS 30G/TWIST MISC      . enoxaparin (LOVENOX) 120 MG/0.8ML injection Inject 0.8 mLs (120 mg total) into the skin 2 (two) times daily for 5 days. (Patient not taking: Reported on 07/29/2019) 8 mL 0 Not Taking at Unknown time   Scheduled:  . B-complex with vitamin C  1 tablet Oral Daily  . cholecalciferol  1,000 Units Oral Q breakfast  . digoxin  0.125 mg Oral Daily  . escitalopram  10 mg Oral QHS  . ezetimibe  10 mg Oral Daily  . fenofibrate  160 mg Oral Q breakfast  . glimepiride  2 mg Oral  Q breakfast  . insulin aspart  0-20 Units Subcutaneous TID WC  . insulin aspart  0-5 Units Subcutaneous QHS  . insulin glargine  30 Units Subcutaneous BID  . metoprolol succinate  25 mg Oral BID  . mometasone-formoterol  2 puff Inhalation BID  . saccharomyces boulardii  250 mg Oral 3 times weekly  . sacubitril-valsartan  1 tablet Oral BID  . sodium chloride flush  3 mL Intravenous Q12H  . sodium chloride flush  3 mL Intravenous Q12H  . torsemide  40 mg Oral Daily   And  . torsemide  20 mg Oral Daily  . vitamin C  500 mg Oral BID    Assessment: 68 yo M on warfarin PTA for mechanical AVR + afib.  Warfarin is being held and bridge initiated with heparin pending toe amputation surgical evaluation.   Direct admit, admission labs pending. Patient previously required high dose of heparin to achieve therapeutic goals. INR was 2.7 at outpatient coumadin appointment on 8/25. Today INR is 2.5,  Warfarin remains on hold.  Plan for left BKA tomorrow 8/28. Heparin level is 0.23 on heparin drip rate 1850 units/hr. Vitamin K given this afternoon.   Warfarin dose PTA = 10mg  daily except 12.5mg  TTS  Goal of Therapy:  INR 2-3 Heparin level 0.3-0.7 units/ml Monitor platelets by anticoagulation protocol: Yes   Plan:  Increase heparin rate to 2050 units/hr Check heparin level in am Warfarin remains on hold.  Plan for left BKA tomorrow 8/28. Daily HL, INR, and CBC   Thank you for involving pharmacy in this patient's care.  Erin Hearing PharmD., BCPS Clinical Pharmacist 07/30/2019 4:42 PM

## 2019-07-30 NOTE — Consult Note (Signed)
   [facility] Inpatient Consult   07/30/2019  ARUSH MICA [birth year] [id number]   Patient is currently active with [facility] for chronic disease management services.  Patient has been engaged by a [facility] Coordinator   Patient has readmitted in less than 30 days with an extreme high risk for unplaned readmission socre of 41% currently.  Insurance Plan:  Medicare [place]  Primary Care Provider: Dr. Orpah Melter  Chart reviewed and notes as follows from MD history and Physical notes:  ZYLEN HOGANSON is a 68 y.o. male with medical history significant of hypertension, diabetes mellitus type 2, asthma, systolic congestive heart failure with EF of 30 to 35%, coronary artery disease, atrial fibrillation on warfarin, aortic stenosis status post AVR with mechanical valve on Coumadin needing replacement, obstructive sleep apnea on CPAP, chronic kidney disease stage III, depression with anxiety who presented to the hospital today having been sent in by Dr. Ainsley Spinner office.  Went into the vascular office today for follow-up after his right amputation of left great toe by Dr. Carlis Abbott on 07/03/2019.  His wound was noted to be erythematous and swollen with purulent drainage.  He was referred to [facility] for direct admission due to his complicated medical history.  Patient will need bridging heparin prior to surgery.  Unfortunately the patient awoke this morning and had some nausea and vomiting with associated diarrhea.  He states that whenever he gets antibiotics he gets diarrhea.  He has not had any diarrhea prior to today.  While I was examining him patient had a momentary episode of fecal urgency which resolved.  Patient is concerned that he will be getting antibiotics and will have diarrhea.  I have ordered stool studies given that he was on vancomycin and Rocephin less than a month ago.   Chart also reveals patient is for  A possible below the knee amputation.  Plan: Follow for progress  and with [facility] team member to make aware that [facility] following and for disposition needs.    Of note, [facility] Care Management services does not replace or interfere with any services that are needed or arranged by inpatient [facility] care management team.  For additional questions or referrals please contact:  Natividad Brood, RN BSN [facility] Liaison  [phone] business mobile phone Toll free office [phone]  Fax number: [phone] [name]

## 2019-07-31 ENCOUNTER — Inpatient Hospital Stay (HOSPITAL_COMMUNITY): Payer: Medicare Other | Admitting: Anesthesiology

## 2019-07-31 ENCOUNTER — Inpatient Hospital Stay (HOSPITAL_COMMUNITY): Payer: Medicare Other | Admitting: Internal Medicine

## 2019-07-31 ENCOUNTER — Encounter (HOSPITAL_COMMUNITY): Payer: Self-pay | Admitting: *Deleted

## 2019-07-31 ENCOUNTER — Encounter (HOSPITAL_COMMUNITY)
Admission: AD | Disposition: A | Discharge status: Home Health | Payer: Self-pay | Source: Home / Self Care | Attending: Internal Medicine

## 2019-07-31 DIAGNOSIS — T8789 Other complications of amputation stump: Secondary | ICD-10-CM

## 2019-07-31 HISTORY — PX: AMPUTATION: SHX166

## 2019-07-31 LAB — CBC
HCT: 34.3 % — ABNORMAL LOW (ref 39.0–52.0)
Hemoglobin: 11 g/dL — ABNORMAL LOW (ref 13.0–17.0)
MCH: 26.3 pg (ref 26.0–34.0)
MCHC: 32.1 g/dL (ref 30.0–36.0)
MCV: 82.1 fL (ref 80.0–100.0)
Platelets: 209 10*3/uL (ref 150–400)
RBC: 4.18 MIL/uL — ABNORMAL LOW (ref 4.22–5.81)
RDW: 17.9 % — ABNORMAL HIGH (ref 11.5–15.5)
WBC: 10.4 10*3/uL (ref 4.0–10.5)
nRBC: 0 % (ref 0.0–0.2)

## 2019-07-31 LAB — GLUCOSE, CAPILLARY
Glucose-Capillary: 147 mg/dL — ABNORMAL HIGH (ref 70–99)
Glucose-Capillary: 143 mg/dL — ABNORMAL HIGH (ref 70–99)
Glucose-Capillary: 206 mg/dL — ABNORMAL HIGH (ref 70–99)
Glucose-Capillary: 309 mg/dL — ABNORMAL HIGH (ref 70–99)
Glucose-Capillary: 374 mg/dL — ABNORMAL HIGH (ref 70–99)

## 2019-07-31 LAB — HEPARIN LEVEL (UNFRACTIONATED): Heparin Unfractionated: 0.38 IU/mL (ref 0.30–0.70)

## 2019-07-31 LAB — BASIC METABOLIC PANEL
Anion gap: 12 (ref 5–15)
BUN: 56 mg/dL — ABNORMAL HIGH (ref 8–23)
CO2: 22 mmol/L (ref 22–32)
Calcium: 8.7 mg/dL — ABNORMAL LOW (ref 8.9–10.3)
Chloride: 101 mmol/L (ref 98–111)
Creatinine, Ser: 1.78 mg/dL — ABNORMAL HIGH (ref 0.61–1.24)
GFR calc Af Amer: 44 mL/min — ABNORMAL LOW (ref >60)
GFR calc non Af Amer: 38 mL/min — ABNORMAL LOW (ref >60)
Glucose, Bld: 150 mg/dL — ABNORMAL HIGH (ref 70–99)
Potassium: 3.9 mmol/L (ref 3.5–5.1)
Sodium: 135 mmol/L (ref 135–145)

## 2019-07-31 LAB — PROTIME-INR
INR: 1.4 — ABNORMAL HIGH (ref 0.8–1.2)
Prothrombin Time: 17.3 seconds — ABNORMAL HIGH (ref 11.4–15.2)

## 2019-07-31 LAB — URINALYSIS, ROUTINE W REFLEX MICROSCOPIC
Bilirubin Urine: NEGATIVE
Glucose, UA: 150 mg/dL — AB
Hgb urine dipstick: NEGATIVE
Ketones, ur: NEGATIVE mg/dL
Leukocytes,Ua: NEGATIVE
Nitrite: NEGATIVE
Protein, ur: NEGATIVE mg/dL
Specific Gravity, Urine: 1.013 (ref 1.005–1.030)
pH: 5 (ref 5.0–8.0)

## 2019-07-31 SURGERY — AMPUTATION BELOW KNEE
Anesthesia: General | Site: Knee | Laterality: Left

## 2019-07-31 MED ORDER — HYDROMORPHONE HCL 1 MG/ML IJ SOLN
0.2500 mg | INTRAMUSCULAR | Status: DC | PRN
  Administered 2019-07-31 (×4): 0.5 mg via INTRAVENOUS

## 2019-07-31 MED ORDER — MIDAZOLAM HCL 2 MG/2ML IJ SOLN
INTRAMUSCULAR | Status: AC
  Filled 2019-07-31: qty 2

## 2019-07-31 MED ORDER — KETOROLAC TROMETHAMINE 15 MG/ML IJ SOLN
15.0000 mg | Freq: Once | INTRAMUSCULAR | Status: AC | PRN
  Administered 2019-07-31: 12:00:00 15 mg via INTRAVENOUS

## 2019-07-31 MED ORDER — LACTATED RINGERS IV SOLN
INTRAVENOUS | Status: DC
  Administered 2019-07-31 (×2): via INTRAVENOUS

## 2019-07-31 MED ORDER — FENTANYL CITRATE (PF) 100 MCG/2ML IJ SOLN
INTRAMUSCULAR | Status: DC | PRN
  Administered 2019-07-31: 100 ug via INTRAVENOUS
  Administered 2019-07-31 (×4): 50 ug via INTRAVENOUS
  Administered 2019-07-31: 100 ug via INTRAVENOUS
  Administered 2019-07-31 (×2): 50 ug via INTRAVENOUS

## 2019-07-31 MED ORDER — DEXMEDETOMIDINE HCL 200 MCG/2ML IV SOLN
INTRAVENOUS | Status: DC | PRN
  Administered 2019-07-31: 20 ug via INTRAVENOUS
  Administered 2019-07-31: 8 ug via INTRAVENOUS
  Administered 2019-07-31: 12 ug via INTRAVENOUS

## 2019-07-31 MED ORDER — HYDROMORPHONE HCL 1 MG/ML IJ SOLN
INTRAMUSCULAR | Status: AC
  Filled 2019-07-31: qty 1

## 2019-07-31 MED ORDER — ACETAMINOPHEN 10 MG/ML IV SOLN
1000.0000 mg | Freq: Once | INTRAVENOUS | Status: DC | PRN
  Administered 2019-07-31: 1000 mg via INTRAVENOUS

## 2019-07-31 MED ORDER — PROPOFOL 10 MG/ML IV BOLUS
INTRAVENOUS | Status: AC
  Filled 2019-07-31: qty 20

## 2019-07-31 MED ORDER — MORPHINE SULFATE (PF) 2 MG/ML IV SOLN
2.0000 mg | INTRAVENOUS | Status: DC | PRN
  Administered 2019-07-31 – 2019-08-01 (×8): 2 mg via INTRAVENOUS
  Filled 2019-07-31 (×8): qty 1

## 2019-07-31 MED ORDER — FENTANYL CITRATE (PF) 100 MCG/2ML IJ SOLN
25.0000 ug | INTRAMUSCULAR | Status: DC | PRN
  Administered 2019-07-31 (×2): 50 ug via INTRAVENOUS

## 2019-07-31 MED ORDER — EPHEDRINE SULFATE-NACL 50-0.9 MG/10ML-% IV SOSY
PREFILLED_SYRINGE | INTRAVENOUS | Status: DC | PRN
  Administered 2019-07-31 (×2): 5 mg via INTRAVENOUS

## 2019-07-31 MED ORDER — ONDANSETRON HCL 4 MG/2ML IJ SOLN
INTRAMUSCULAR | Status: DC | PRN
  Administered 2019-07-31: 4 mg via INTRAVENOUS

## 2019-07-31 MED ORDER — EPHEDRINE 5 MG/ML INJ
INTRAVENOUS | Status: AC
  Filled 2019-07-31: qty 10

## 2019-07-31 MED ORDER — DEXAMETHASONE SODIUM PHOSPHATE 10 MG/ML IJ SOLN
INTRAMUSCULAR | Status: DC | PRN
  Administered 2019-07-31: 5 mg via INTRAVENOUS

## 2019-07-31 MED ORDER — ONDANSETRON HCL 4 MG/2ML IJ SOLN: 4.0000 mg | Freq: Once | INTRAMUSCULAR | Status: DC | PRN

## 2019-07-31 MED ORDER — PHENYLEPHRINE 40 MCG/ML (10ML) SYRINGE FOR IV PUSH (FOR BLOOD PRESSURE SUPPORT)
PREFILLED_SYRINGE | INTRAVENOUS | Status: DC | PRN
  Administered 2019-07-31 (×3): 80 ug via INTRAVENOUS

## 2019-07-31 MED ORDER — ACETAMINOPHEN 10 MG/ML IV SOLN: 1000.0000 mg | Freq: Four times a day (QID) | INTRAVENOUS | Status: DC

## 2019-07-31 MED ORDER — MIDAZOLAM HCL 5 MG/5ML IJ SOLN
INTRAMUSCULAR | Status: DC | PRN
  Administered 2019-07-31: 2 mg via INTRAVENOUS

## 2019-07-31 MED ORDER — DEXMEDETOMIDINE HCL IN NACL 200 MCG/50ML IV SOLN
INTRAVENOUS | Status: AC
  Filled 2019-07-31: qty 50

## 2019-07-31 MED ORDER — FENTANYL CITRATE (PF) 100 MCG/2ML IJ SOLN
INTRAMUSCULAR | Status: AC
  Filled 2019-07-31: qty 2

## 2019-07-31 MED ORDER — 0.9 % SODIUM CHLORIDE (POUR BTL) OPTIME
TOPICAL | Status: DC | PRN
  Administered 2019-07-31: 10:00:00 1000 mL

## 2019-07-31 MED ORDER — PHENYLEPHRINE 40 MCG/ML (10ML) SYRINGE FOR IV PUSH (FOR BLOOD PRESSURE SUPPORT)
PREFILLED_SYRINGE | INTRAVENOUS | Status: AC
  Filled 2019-07-31: qty 10

## 2019-07-31 MED ORDER — SODIUM CHLORIDE 0.9 % IV SOLN
INTRAVENOUS | Status: DC | PRN
  Administered 2019-07-31 – 2019-08-01 (×2): 250 mL via INTRAVENOUS

## 2019-07-31 MED ORDER — MORPHINE SULFATE (PF) 2 MG/ML IV SOLN: 1.0000 mg | Freq: Once | INTRAVENOUS | Status: DC

## 2019-07-31 MED ORDER — DEXAMETHASONE SODIUM PHOSPHATE 10 MG/ML IJ SOLN
INTRAMUSCULAR | Status: AC
  Filled 2019-07-31: qty 1

## 2019-07-31 MED ORDER — KETOROLAC TROMETHAMINE 30 MG/ML IJ SOLN
INTRAMUSCULAR | Status: AC
  Filled 2019-07-31: qty 1

## 2019-07-31 MED ORDER — LIDOCAINE 2% (20 MG/ML) 5 ML SYRINGE
INTRAMUSCULAR | Status: DC | PRN
  Administered 2019-07-31: 60 mg via INTRAVENOUS

## 2019-07-31 MED ORDER — FENTANYL CITRATE (PF) 250 MCG/5ML IJ SOLN
INTRAMUSCULAR | Status: AC
  Filled 2019-07-31: qty 5

## 2019-07-31 MED ORDER — PROPOFOL 10 MG/ML IV BOLUS
INTRAVENOUS | Status: DC | PRN
  Administered 2019-07-31: 200 mg via INTRAVENOUS

## 2019-07-31 MED ORDER — LIDOCAINE 2% (20 MG/ML) 5 ML SYRINGE
INTRAMUSCULAR | Status: AC
  Filled 2019-07-31: qty 5

## 2019-07-31 MED ORDER — ONDANSETRON HCL 4 MG/2ML IJ SOLN
INTRAMUSCULAR | Status: AC
  Filled 2019-07-31: qty 2

## 2019-07-31 MED ORDER — HEPARIN (PORCINE) 25000 UT/250ML-% IV SOLN
2650.0000 [IU]/h | INTRAVENOUS | Status: DC
  Administered 2019-08-01: 05:00:00 2050 [IU]/h via INTRAVENOUS
  Administered 2019-08-01: 19:00:00 2350 [IU]/h via INTRAVENOUS
  Administered 2019-08-02 – 2019-08-05 (×7): 2650 [IU]/h via INTRAVENOUS
  Filled 2019-07-31 (×11): qty 250

## 2019-07-31 MED ORDER — ACETAMINOPHEN 10 MG/ML IV SOLN
INTRAVENOUS | Status: AC
  Filled 2019-07-31: qty 100

## 2019-07-31 SURGICAL SUPPLY — 68 items
BANDAGE ESMARK 6X9 LF (GAUZE/BANDAGES/DRESSINGS) IMPLANT
BLADE SAW SAG 73X25 THK (BLADE) ×1
BLADE SAW SGTL 73X25 THK (BLADE) ×2 IMPLANT
BNDG COHESIVE 6X5 TAN STRL LF (GAUZE/BANDAGES/DRESSINGS) ×3 IMPLANT
BNDG ELASTIC 4X5.8 VLCR STR LF (GAUZE/BANDAGES/DRESSINGS) ×3 IMPLANT
BNDG ELASTIC 6X5.8 VLCR STR LF (GAUZE/BANDAGES/DRESSINGS) ×3 IMPLANT
BNDG ESMARK 6X9 LF (GAUZE/BANDAGES/DRESSINGS)
BNDG GAUZE ELAST 4 BULKY (GAUZE/BANDAGES/DRESSINGS) ×6 IMPLANT
CANISTER SUCT 3000ML PPV (MISCELLANEOUS) ×3 IMPLANT
CLIP VESOCCLUDE MED 6/CT (CLIP) IMPLANT
COVER BACK TABLE 60X90IN (DRAPES) IMPLANT
COVER SURGICAL LIGHT HANDLE (MISCELLANEOUS) ×3 IMPLANT
COVER WAND RF STERILE (DRAPES) ×3 IMPLANT
CUFF TOURN SGL QUICK 24 (TOURNIQUET CUFF)
CUFF TOURN SGL QUICK 34 (TOURNIQUET CUFF)
CUFF TOURN SGL QUICK 42 (TOURNIQUET CUFF) IMPLANT
CUFF TRNQT CYL 24X4X16.5-23 (TOURNIQUET CUFF) IMPLANT
CUFF TRNQT CYL 34X4.125X (TOURNIQUET CUFF) IMPLANT
DRAIN CHANNEL 19F RND (DRAIN) IMPLANT
DRAPE HALF SHEET 40X57 (DRAPES) ×3 IMPLANT
DRAPE ORTHO SPLIT 77X108 STRL (DRAPES) ×4
DRAPE SURG ORHT 6 SPLT 77X108 (DRAPES) ×2 IMPLANT
DRSG ADAPTIC 3X8 NADH LF (GAUZE/BANDAGES/DRESSINGS) ×3 IMPLANT
DRSG KUZMA FLUFF (GAUZE/BANDAGES/DRESSINGS) ×2 IMPLANT
DRSG XEROFORM 1X8 (GAUZE/BANDAGES/DRESSINGS) ×2 IMPLANT
ELECT REM PT RETURN 9FT ADLT (ELECTROSURGICAL) ×3
ELECTRODE REM PT RTRN 9FT ADLT (ELECTROSURGICAL) ×1 IMPLANT
EVACUATOR SILICONE 100CC (DRAIN) IMPLANT
GAUZE SPONGE 4X4 12PLY STRL (GAUZE/BANDAGES/DRESSINGS) ×6 IMPLANT
GAUZE SPONGE 4X4 12PLY STRL LF (GAUZE/BANDAGES/DRESSINGS) ×2 IMPLANT
GAUZE XEROFORM 1X8 LF (GAUZE/BANDAGES/DRESSINGS) ×2 IMPLANT
GLOVE BIO SURGEON STRL SZ 6.5 (GLOVE) ×1 IMPLANT
GLOVE BIO SURGEON STRL SZ7.5 (GLOVE) ×3 IMPLANT
GLOVE BIO SURGEONS STRL SZ 6.5 (GLOVE) ×1
GLOVE BIOGEL PI IND STRL 6.5 (GLOVE) IMPLANT
GLOVE BIOGEL PI IND STRL 8 (GLOVE) ×1 IMPLANT
GLOVE BIOGEL PI IND STRL 8.5 (GLOVE) IMPLANT
GLOVE BIOGEL PI INDICATOR 6.5 (GLOVE) ×2
GLOVE BIOGEL PI INDICATOR 8 (GLOVE) ×2
GLOVE BIOGEL PI INDICATOR 8.5 (GLOVE) ×2
GOWN STRL REUS W/ TWL LRG LVL3 (GOWN DISPOSABLE) ×2 IMPLANT
GOWN STRL REUS W/ TWL XL LVL3 (GOWN DISPOSABLE) ×2 IMPLANT
GOWN STRL REUS W/TWL LRG LVL3 (GOWN DISPOSABLE) ×4
GOWN STRL REUS W/TWL XL LVL3 (GOWN DISPOSABLE) ×4
KIT BASIN OR (CUSTOM PROCEDURE TRAY) ×3 IMPLANT
KIT TURNOVER KIT B (KITS) ×3 IMPLANT
NS IRRIG 1000ML POUR BTL (IV SOLUTION) ×3 IMPLANT
PACK GENERAL/GYN (CUSTOM PROCEDURE TRAY) ×3 IMPLANT
PAD ARMBOARD 7.5X6 YLW CONV (MISCELLANEOUS) ×6 IMPLANT
SPONGE LAP 18X18 RF (DISPOSABLE) IMPLANT
SPONGE LAP 18X18 X RAY DECT (DISPOSABLE) ×4 IMPLANT
STAPLER VISISTAT 35W (STAPLE) ×7 IMPLANT
STOCKINETTE IMPERVIOUS LG (DRAPES) ×3 IMPLANT
SUT ETHILON 3 0 PS 1 (SUTURE) IMPLANT
SUT SILK 0 TIES 10X30 (SUTURE) IMPLANT
SUT SILK 2 0 (SUTURE) ×2
SUT SILK 2 0 SH CR/8 (SUTURE) ×3 IMPLANT
SUT SILK 2 0 TIES 17X18 (SUTURE) ×2
SUT SILK 2-0 18XBRD TIE 12 (SUTURE) ×1 IMPLANT
SUT SILK 2-0 18XBRD TIE BLK (SUTURE) IMPLANT
SUT SILK 3 0 (SUTURE) ×2
SUT SILK 3-0 18XBRD TIE 12 (SUTURE) ×1 IMPLANT
SUT VIC AB 2-0 CT1 18 (SUTURE) ×12 IMPLANT
SUT VIC AB 3-0 SH 18 (SUTURE) IMPLANT
TAPE UMBILICAL COTTON 1/8X30 (MISCELLANEOUS) ×3 IMPLANT
TOWEL GREEN STERILE (TOWEL DISPOSABLE) ×6 IMPLANT
UNDERPAD 30X30 (UNDERPADS AND DIAPERS) ×3 IMPLANT
WATER STERILE IRR 1000ML POUR (IV SOLUTION) ×3 IMPLANT

## 2019-07-31 NOTE — Transfer of Care (Signed)
Immediate Anesthesia Transfer of Care Note  Patient: Joseph Hoffman  Procedure(s) Performed: AMPUTATION BELOW KNEE LEFT (Left Knee)  Patient Location: PACU  Anesthesia Type:General  Level of Consciousness: drowsy and patient cooperative  Airway & Oxygen Therapy: Patient Spontanous Breathing and Patient connected to nasal cannula oxygen  Post-op Assessment: Report given to RN, Post -op Vital signs reviewed and stable and Patient moving all extremities  Post vital signs: Reviewed and stable  Last Vitals:  Vitals Value Taken Time  BP 121/74 07/31/19 1134  Temp    Pulse 93 07/31/19 1140  Resp 20 07/31/19 1140  SpO2 98 % 07/31/19 1140  Vitals shown include unvalidated device data.  Last Pain:  Vitals:   07/31/19 0528  TempSrc:   PainSc: 3       Patients Stated Pain Goal: 0 ([id number])  Complications: No apparent anesthesia complications

## 2019-07-31 NOTE — Progress Notes (Signed)
ANTICOAGULATION CONSULT NOTE - Follow Up Consult  Pharmacy Consult for Heparin  Indication: atrial fibrillation and mechanical AVR  Allergies  Allergen Reactions  . Iohexol Anaphylaxis  . Niacin And Related     Flushing with immediate realese  . Penicillins Other (See Comments)    Unknown.[name]  Did it involve swelling of the face/tongue/throat, SOB, or low BP? Unknown Did it involve sudden or severe rash/hives, skin peeling, or any reaction on the inside of your mouth or nose? Unknown Did you need to seek medical attention at a hospital or doctor's office? Unknown When did it last happen?Childhood If all above answers are "NO", may proceed with cephalosporin use.    Patient Measurements: Height: 5\' 11"  (180.3 cm)(recorded on 07/28/19) Weight: 247 lb 9.2 oz (112.3 kg) IBW/kg (Calculated) : 75.3 Heparin Dosing Weight: 99.2 kg  Vital Signs: Temp: 99.7 F (37.6 C) (08/28 0400) Temp Source: Oral (08/28 0400) BP: 143/55 (08/27 2202) Pulse Rate: 83 (08/28 0400)  Labs: Recent Labs    07/29/19 1726 07/30/19 0619 07/30/19 1535 07/31/19 0513  HGB 12.0* 11.4*  --  11.0*  HCT 38.2* 36.3*  --  34.3*  PLT 239 227  --  209  APTT 48* 100*  --   --   LABPROT 25.6* 26.6*  --   --   INR 2.4* 2.5*  --   --   HEPARINUNFRC  --  0.18* 0.23* 0.38  CREATININE 1.98* 1.86*  --  1.78*    Estimated Creatinine Clearance: 49.2 mL/min (A) (by C-G formula based on SCr of 1.78 mg/dL (H)).   Medications:  Medications Prior to Admission  Medication Sig Dispense Refill Last Dose  . acetaminophen (TYLENOL) 325 MG tablet Take 2 tablets (650 mg total) by mouth every 4 (four) hours as needed for pain or fever. (Patient taking differently: Take 650 mg by mouth every 4 (four) hours as needed for fever or headache (pain). )     . albuterol (PROVENTIL HFA;VENTOLIN HFA) 108 (90 BASE) MCG/ACT inhaler Inhale 2 puffs into the lungs every 6 (six) hours as needed for wheezing or  shortness of breath.      [name] b complex vitamins tablet Take 1 tablet by mouth daily with breakfast.     . cholecalciferol (VITAMIN D) 1000 UNITS tablet Take 1,000 Units by mouth daily with breakfast.      . Chromium 500 MCG TABS Take 500 mcg by mouth every evening.     . digoxin (LANOXIN) 0.125 MG tablet TAKE 1 TABLET EVERY DAY (Patient taking differently: Take 0.125 mg by mouth daily. ) 90 tablet 1   . empagliflozin (JARDIANCE) 25 MG TABS tablet Take 25 mg by mouth daily. 90 tablet 1   . escitalopram (LEXAPRO) 10 MG tablet Take 10 mg by mouth at bedtime.      [name] ezetimibe (ZETIA) 10 MG tablet Take 1 tablet (10 mg total) by mouth daily. 90 tablet 3   . fenofibrate 160 MG tablet Take 1 tablet (160 mg total) by mouth daily. (Patient taking differently: Take 160 mg by mouth daily with breakfast. ) 30 tablet 1   . Fluticasone-Salmeterol (ADVAIR) 250-50 MCG/DOSE AEPB Inhale 1 puff into the lungs 2 (two) times daily as needed (shortness of breath/asthma related symptoms).      [name] glimepiride (AMARYL) 2 MG tablet Take 2 mg by mouth daily with breakfast.      . Glucosamine-Chondroit-Vit C-Mn (GLUCOSAMINE 1500 COMPLEX PO) Take 1 tablet by mouth daily as needed (  arthritis flare up).      Vanessa Kick Ethyl (VASCEPA) 1 g CAPS Take 2 capsules (2 g total) by mouth 2 (two) times daily. (Patient taking differently: Take 2 g by mouth 2 (two) times daily with a meal. ) 360 capsule 3   . insulin glargine (LANTUS) 100 UNIT/ML injection Inject 15-40 Units into the skin See admin instructions. Inject 15 - 40 units subcutaneously daily before breakfast (adjusted per CBG); inject 30-40 units at bedtime ( occasionally adjusted per CBG - usually 35 units)     . liraglutide (VICTOZA) 18 MG/3ML SOPN Inject 1.8 mg into the skin at bedtime.     . metFORMIN (GLUCOPHAGE) 500 MG tablet Take 2,000 mg by mouth daily with supper.     . metoprolol succinate (TOPROL XL) 25 MG 24 hr tablet Take 1 tablet (25 mg total) by mouth 2 (two) times  daily. 90 tablet 1   . nitroGLYCERIN (NITROSTAT) 0.4 MG SL tablet Place 1 tablet (0.4 mg total) under the tongue every 5 (five) minutes as needed for chest pain. (Patient taking differently: Place 0.4 mg under the tongue every 5 (five) minutes x 3 doses as needed for chest pain. ) 30 tablet 2   . saccharomyces boulardii (FLORASTOR) 250 MG capsule Take 250 mg by mouth 3 (three) times a week.      . sacubitril-valsartan (ENTRESTO) 24-26 MG Take 1 tablet by mouth 2 (two) times daily. 60 tablet 3   . torsemide (DEMADEX) 20 MG tablet Take 2 tablets (40 mg total) by mouth every morning. (Patient taking differently: Take 20-40 mg by mouth See admin instructions. Take 2 tablets (40 mg) by mouth daily in the morning & take 1 tablet (20 mg) by mouth at night) 60 tablet 3   . vitamin C (ASCORBIC ACID) 500 MG tablet Take 500 mg by mouth 2 (two) times daily.     [name] warfarin (COUMADIN) 10 MG tablet Take 10 mg by mouth at bedtime. Take 1 tablet (10 mg) by mouth with 0.5 tablet 5 mg (2.5 mg) on Tuesdays, Thursdays, & Saturdays; ONLY take 1 tablet (10 mg) by mouth on Sundays, Mondays, Wednesdays, & Fridays.     [name] warfarin (COUMADIN) 5 MG tablet Take as instructed with 10 mg. (Patient taking differently: Take 2.5 mg by mouth See admin instructions. Take 0.5 tablet (2.5 mg) by mouth with 1 tablet 10 mg (12.5 mg) on Tuesdays, Thursdays, & Saturdays at bedtime) 90 tablet 1   . B-D ULTRAFINE III SHORT PEN 31G X 8 MM MISC      . EASY TOUCH LANCETS 30G/TWIST MISC      . enoxaparin (LOVENOX) 120 MG/0.8ML injection Inject 0.8 mLs (120 mg total) into the skin 2 (two) times daily for 5 days. (Patient not taking: Reported on 07/29/2019) 8 mL 0 Not Taking at Unknown time   Scheduled:  . B-complex with vitamin C  1 tablet Oral Daily  . cholecalciferol  1,000 Units Oral Q breakfast  . digoxin  0.125 mg Oral Daily  . escitalopram  10 mg Oral QHS  . ezetimibe  10 mg Oral Daily  . fenofibrate  160 mg Oral Q breakfast  . glimepiride   2 mg Oral Q breakfast  . insulin aspart  0-20 Units Subcutaneous TID WC  . insulin aspart  0-5 Units Subcutaneous QHS  . insulin glargine  30 Units Subcutaneous BID  . metoprolol succinate  25 mg Oral BID  . mometasone-formoterol  2 puff Inhalation BID  .  saccharomyces boulardii  250 mg Oral 3 times weekly  . sacubitril-valsartan  1 tablet Oral BID  . sodium chloride flush  3 mL Intravenous Q12H  . sodium chloride flush  3 mL Intravenous Q12H  . torsemide  40 mg Oral Daily   And  . torsemide  20 mg Oral Daily  . vitamin C  500 mg Oral BID    Assessment: 68 yo M on warfarin PTA for mechanical AVR + afib.  Warfarin is being held and bridge initiated with heparin pending toe amputation surgical evaluation.   Patient previously required high dose of heparin to achieve therapeutic goals. INR was 2.7 at outpatient coumadin appointment on 8/25. 8/27 INR is 2.5, today's INR pending.  Received 5 mg IV vit K yesterday.  Warfarin remains on hold.  Plan for left BKA today.  Heparin level is within goal range this morning.  No overt bleeding or complications noted.  CBC fairly stable.  Warfarin dose PTA = 10mg  daily except 12.5mg  TTS  Goal of Therapy:  INR 2-3 Heparin level 0.3-0.7 units/ml Monitor platelets by anticoagulation protocol: Yes   Plan:  Continue IV heparin at current rate. Continue to hold warfarin. F/u plans for anticoagulation after BKA today.  [name], [facility] Clinical Pharmacist Phone [phone]  07/31/2019 8:04 AM

## 2019-07-31 NOTE — Anesthesia Preprocedure Evaluation (Addendum)
Anesthesia Evaluation  Patient identified by MRN, date of birth, ID band Patient awake    Reviewed: Allergy & Precautions, NPO status , Patient's Chart, lab work & pertinent test results  Airway Mallampati: III  TM Distance: >3 FB Neck ROM: Full    Dental no notable dental hx.    Pulmonary asthma , sleep apnea and Continuous Positive Airway Pressure Ventilation , former smoker,    Pulmonary exam normal breath sounds clear to auscultation       Cardiovascular hypertension, Pt. on home beta blockers and Pt. on medications + CAD, + Past MI, + Peripheral Vascular Disease and +CHF  + dysrhythmias Atrial Fibrillation + Valvular Problems/Murmurs (s/p AVR) AS  Rhythm:Irregular Rate:Normal  ECG: a-fib, rate 66  Myocardial 1. No reversible ischemia. Fixed defect in the apex and apical inferior wall is likely related to prior infarct.   2. Global hypokinesis.   3. Left ventricular ejection fraction 31%   Neuro/Psych PSYCHIATRIC DISORDERS Anxiety Depression negative neurological ROS     GI/Hepatic negative GI ROS, (+) Hepatitis -  Endo/Other  diabetes, Oral Hypoglycemic Agents, Insulin Dependent  Renal/GU Renal InsufficiencyRenal disease     Musculoskeletal negative musculoskeletal ROS (+)   Abdominal (+) + obese,   Peds  Hematology  (+) anemia ,   Anesthesia Other Findings NON-HEALING SURGICAL WOUND NON-VIABLE TISSUE LEFT LOWER EXTREMITY  Reproductive/Obstetrics                            Anesthesia Physical Anesthesia Plan  ASA: III  Anesthesia Plan: General   Post-op Pain Management:    Induction: Intravenous  PONV Risk Score and Plan: 3 and Midazolam, Dexamethasone, Ondansetron and Treatment may vary due to age or medical condition  Airway Management Planned: LMA  Additional Equipment:   Intra-op Plan:   Post-operative Plan: Extubation in OR  Informed Consent: I have reviewed  the patients History and Physical, chart, labs and discussed the procedure including the risks, benefits and alternatives for the proposed anesthesia with the patient or authorized representative who has indicated his/her understanding and acceptance.     Dental advisory given  Plan Discussed with: CRNA  Anesthesia Plan Comments: (Patient prefers general anesthesia )        Anesthesia Quick Evaluation

## 2019-07-31 NOTE — Op Note (Signed)
Date: July 31, 2019  Preoperative diagnosis: Critical limb ischemia of the left lower extremity with nonhealing great toe amputation  Postoperative diagnosis: Same  Procedure: Left below-knee amputation  Surgeon: Dr. Marty Heck, MD  Assistant: Arlee Muslim, PA  Indications: Patient is a 68 year old male who was previously seen for critical limb ischemia of his left lower extremity with tissue loss of the left great toe.  He underwent arteriogram and had inline flow down a very robust peroneal artery.  His toe was subsequently amputated but unfortunately he was seen in clinic last week with his toe melting and failure to heal with cellulitis.  He was offered debridement and VAC and/or TMA versus a more definitive operation like a BKA.  Patient ultimately elected for BKA because he wants to proceed with his planned heart surgery and valve repair.  Findings: Left below-knee amputation with healthy tissue margins.  Anesthesia: LMA  EBL: 300 mL's  Findings: The patient was taken to the operating room after informed consent was btained.  He was placed operative table supine position.  General anesthesia was induced with an LMA.  Ultimately his left leg was then prepped and draped in usual sterile fashion.  Initially marked out his margin for tibial transection 1 handsbreadth below the tibial tuberosity in the left leg.  I then used a 2-0 suture here and measured the circumference of the calf.  This was cut into two thirds and one thirds distance.  The two thirds distance was used to measure the anterior posterior length of the incision in the one third distance was used to measure the length of the flap distally down the calf.  Incision was then made with a 15 blade scalpel.  I then used Bovie cautery to open subcutaneous tissue fascia and dissected down with muscle.  I circumferentially mobilized muscle off of the tibia.  The anterior tibial arteries and veins were identified ligated  between hemostats and suture ligated with 2-0 silk ties.  The tibia was transected with an oscillating saw. I did the same with the fibula and it was circumferentially isolated with Bovie cautery and transected with an oscillating saw.  We completed the posterior flap by leaving a large piece of gastrocnemius for the muscle flap.  We identified the peroneal and posterior tibial arteries and these were ligated between hemostats and divided and ligated with 2-0 silk ties.  I then completed the posterior flap.  When then irrigated everything until the effluent was clear.  I tried to close the flap unfortunately the posterior flap was too bulky and I could not get it closed and there was too much tension.  As a result we removed additional gastrocnemius muscle to shorten the flap and make it less bulky and we had to do this several times.  Finally we reapproximated the flap with 2-0 Vicryl's until we could not get a finger between the fascia.  Everything was washed out and irrigated again and then the skin was reapproximated with staples.  A sterile dressing was applied.  He was taken to recovery in stable condition.  Complication: None  Condition: Stable  Marty Heck, MD [facility] Office: [phone] Pager: [place]

## 2019-07-31 NOTE — Progress Notes (Signed)
[facility]  Subjective  - Having more pain in left foot.   Objective (!) 122/40 84 99.7 F (37.6 C) (Oral) (!) 24 93%  Intake/Output Summary (Last 24 hours) at 07/31/2019 0927 Last data filed at 07/31/2019 [id number] Gross per 24 hour  Intake 1479.82 ml  Output 1850 ml  Net -370.18 ml    Left 1st toe amp necrotic - cellulitis on foot  Laboratory Lab Results: Recent Labs    07/30/19 0619 07/31/19 0513  WBC 9.7 10.4  HGB 11.4* 11.0*  HCT 36.3* 34.3*  PLT 227 209   BMET Recent Labs    07/30/19 0619 07/31/19 0513  NA 135 135  K 4.3 3.9  CL 99 101  CO2 24 22  GLUCOSE 182* 150*  BUN 60* 56*  CREATININE 1.86* 1.78*  CALCIUM 9.3 8.7*    COAG Lab Results  Component Value Date   INR 2.5 (H) 07/30/2019   INR 2.4 (H) 07/29/2019   INR 2.7 07/28/2019   No results found for: PTT  Assessment/Planning:  Plan for left BKA today for CLI of left lower extremity with tissue loss and non-healing great toe amp.  Discussed option of debridement vac possible TMA with high risk for wound failure and he wants to proceed with more definitive therapy to get healed and move forward with his heart surgery.  Marty Heck 07/31/2019 9:27 AM --

## 2019-07-31 NOTE — Progress Notes (Signed)
ANTICOAGULATION CONSULT NOTE - Follow Up Consult  Pharmacy Consult for Heparin > resume 8/29 Indication: atrial fibrillation and mechanical AVR  Allergies  Allergen Reactions  . Iohexol Anaphylaxis  . Niacin And Related     Flushing with immediate realese  . Penicillins Other (See Comments)    Unknown.[name]  Did it involve swelling of the face/tongue/throat, SOB, or low BP? Unknown Did it involve sudden or severe rash/hives, skin peeling, or any reaction on the inside of your mouth or nose? Unknown Did you need to seek medical attention at a hospital or doctor's office? Unknown When did it last happen?Childhood If all above answers are "NO", may proceed with cephalosporin use.    Patient Measurements: Height: 5\' 11"  (180.3 cm) Weight: 247 lb 9.2 oz (112.3 kg) IBW/kg (Calculated) : 75.3 Heparin Dosing Weight: 99.2 kg  Vital Signs: Temp: 98.3 F (36.8 C) (08/28 1135) Temp Source: Oral (08/28 0400) BP: 150/68 (08/28 1230) Pulse Rate: 92 (08/28 1230)  Labs: Recent Labs    07/29/19 1726 07/30/19 0619 07/30/19 1535 07/31/19 0513 07/31/19 0931  HGB 12.0* 11.4*  --  11.0*  --   HCT 38.2* 36.3*  --  34.3*  --   PLT 239 227  --  209  --   APTT 48* 100*  --   --   --   LABPROT 25.6* 26.6*  --   --  17.3*  INR 2.4* 2.5*  --   --  1.4*  HEPARINUNFRC  --  0.18* 0.23* 0.38  --   CREATININE 1.98* 1.86*  --  1.78*  --     Estimated Creatinine Clearance: 49.2 mL/min (A) (by C-G formula based on SCr of 1.78 mg/dL (H)).   Medications:  Medications Prior to Admission  Medication Sig Dispense Refill Last Dose  . acetaminophen (TYLENOL) 325 MG tablet Take 2 tablets (650 mg total) by mouth every 4 (four) hours as needed for pain or fever. (Patient taking differently: Take 650 mg by mouth every 4 (four) hours as needed for fever or headache (pain). )     . albuterol (PROVENTIL HFA;VENTOLIN HFA) 108 (90 BASE) MCG/ACT inhaler Inhale 2 puffs into the lungs every  6 (six) hours as needed for wheezing or shortness of breath.      [name] b complex vitamins tablet Take 1 tablet by mouth daily with breakfast.     . cholecalciferol (VITAMIN D) 1000 UNITS tablet Take 1,000 Units by mouth daily with breakfast.      . Chromium 500 MCG TABS Take 500 mcg by mouth every evening.     . digoxin (LANOXIN) 0.125 MG tablet TAKE 1 TABLET EVERY DAY (Patient taking differently: Take 0.125 mg by mouth daily. ) 90 tablet 1   . empagliflozin (JARDIANCE) 25 MG TABS tablet Take 25 mg by mouth daily. 90 tablet 1   . escitalopram (LEXAPRO) 10 MG tablet Take 10 mg by mouth at bedtime.      [name] ezetimibe (ZETIA) 10 MG tablet Take 1 tablet (10 mg total) by mouth daily. 90 tablet 3   . fenofibrate 160 MG tablet Take 1 tablet (160 mg total) by mouth daily. (Patient taking differently: Take 160 mg by mouth daily with breakfast. ) 30 tablet 1   . Fluticasone-Salmeterol (ADVAIR) 250-50 MCG/DOSE AEPB Inhale 1 puff into the lungs 2 (two) times daily as needed (shortness of breath/asthma related symptoms).      [name] glimepiride (AMARYL) 2 MG tablet Take 2 mg by mouth daily  with breakfast.      . Glucosamine-Chondroit-Vit C-Mn (GLUCOSAMINE 1500 COMPLEX PO) Take 1 tablet by mouth daily as needed (arthritis flare up).      Vanessa Kick Ethyl (VASCEPA) 1 g CAPS Take 2 capsules (2 g total) by mouth 2 (two) times daily. (Patient taking differently: Take 2 g by mouth 2 (two) times daily with a meal. ) 360 capsule 3   . insulin glargine (LANTUS) 100 UNIT/ML injection Inject 15-40 Units into the skin See admin instructions. Inject 15 - 40 units subcutaneously daily before breakfast (adjusted per CBG); inject 30-40 units at bedtime ( occasionally adjusted per CBG - usually 35 units)     . liraglutide (VICTOZA) 18 MG/3ML SOPN Inject 1.8 mg into the skin at bedtime.     . metFORMIN (GLUCOPHAGE) 500 MG tablet Take 2,000 mg by mouth daily with supper.     . metoprolol succinate (TOPROL XL) 25 MG 24 hr tablet Take 1  tablet (25 mg total) by mouth 2 (two) times daily. 90 tablet 1   . nitroGLYCERIN (NITROSTAT) 0.4 MG SL tablet Place 1 tablet (0.4 mg total) under the tongue every 5 (five) minutes as needed for chest pain. (Patient taking differently: Place 0.4 mg under the tongue every 5 (five) minutes x 3 doses as needed for chest pain. ) 30 tablet 2   . saccharomyces boulardii (FLORASTOR) 250 MG capsule Take 250 mg by mouth 3 (three) times a week.      . sacubitril-valsartan (ENTRESTO) 24-26 MG Take 1 tablet by mouth 2 (two) times daily. 60 tablet 3   . torsemide (DEMADEX) 20 MG tablet Take 2 tablets (40 mg total) by mouth every morning. (Patient taking differently: Take 20-40 mg by mouth See admin instructions. Take 2 tablets (40 mg) by mouth daily in the morning & take 1 tablet (20 mg) by mouth at night) 60 tablet 3   . vitamin C (ASCORBIC ACID) 500 MG tablet Take 500 mg by mouth 2 (two) times daily.     [name] warfarin (COUMADIN) 10 MG tablet Take 10 mg by mouth at bedtime. Take 1 tablet (10 mg) by mouth with 0.5 tablet 5 mg (2.5 mg) on Tuesdays, Thursdays, & Saturdays; ONLY take 1 tablet (10 mg) by mouth on Sundays, Mondays, Wednesdays, & Fridays.     [name] warfarin (COUMADIN) 5 MG tablet Take as instructed with 10 mg. (Patient taking differently: Take 2.5 mg by mouth See admin instructions. Take 0.5 tablet (2.5 mg) by mouth with 1 tablet 10 mg (12.5 mg) on Tuesdays, Thursdays, & Saturdays at bedtime) 90 tablet 1   . B-D ULTRAFINE III SHORT PEN 31G X 8 MM MISC      . EASY TOUCH LANCETS 30G/TWIST MISC      . enoxaparin (LOVENOX) 120 MG/0.8ML injection Inject 0.8 mLs (120 mg total) into the skin 2 (two) times daily for 5 days. (Patient not taking: Reported on 07/29/2019) 8 mL 0 Not Taking at Unknown time   Scheduled:  . B-complex with vitamin C  1 tablet Oral Daily  . cholecalciferol  1,000 Units Oral Q breakfast  . digoxin  0.125 mg Oral Daily  . escitalopram  10 mg Oral QHS  . ezetimibe  10 mg Oral Daily  .  fenofibrate  160 mg Oral Q breakfast  . fentaNYL      . glimepiride  2 mg Oral Q breakfast  . HYDROmorphone      . HYDROmorphone      . insulin aspart  0-20 Units Subcutaneous TID WC  . insulin aspart  0-5 Units Subcutaneous QHS  . insulin glargine  30 Units Subcutaneous BID  . ketorolac      . metoprolol succinate  25 mg Oral BID  . mometasone-formoterol  2 puff Inhalation BID  . saccharomyces boulardii  250 mg Oral 3 times weekly  . sacubitril-valsartan  1 tablet Oral BID  . sodium chloride flush  3 mL Intravenous Q12H  . sodium chloride flush  3 mL Intravenous Q12H  . torsemide  40 mg Oral Daily   And  . torsemide  20 mg Oral Daily  . vitamin C  500 mg Oral BID    Assessment: 68 yo M on warfarin PTA for mechanical AVR + afib.  Warfarin is being held and bridge initiated with heparin pending toe amputation surgical evaluation.   Patient previously required high dose of heparin to achieve therapeutic goals. INR was 2.7 at outpatient coumadin appointment on 8/25. 8/27 INR is 2.5, today's INR pending.  Received 5 mg IV vit K yesterday.  Warfarin remains on hold.  Plan for left BKA today.  Heparin level is within goal range this morning.  No overt bleeding or complications noted.  CBC fairly stable.  Heparin was turned off prior to amputation today.  Warfarin dose PTA = 10 mg daily except 12.5mg  TTS  Goal of Therapy:  INR 2-3 Heparin level 0.3-0.7 units/ml Monitor platelets by anticoagulation protocol: Yes   Plan:  Per orders, will resume heparin at 2050 units/hr at 0500 on Saturday 8/29. Check heparin level 6 hrs after gtt resumed. Daily heparin level and CBC. Continue to hold warfarin- f/u resume 8/29?   Joseph Hoffman, [facility] Clinical Pharmacist Phone [phone]  07/31/2019 1:01 PM

## 2019-07-31 NOTE — Progress Notes (Signed)
Pt has CPAP setup at bedside and places himself on/off per home use. O2 bled in2L. H2O filled

## 2019-07-31 NOTE — Anesthesia Procedure Notes (Signed)
Procedure Name: LMA Insertion Date/Time: 07/31/2019 9:48 AM Performed by: Moshe Salisbury, CRNA Pre-anesthesia Checklist: Patient identified, Emergency Drugs available, Suction available and Patient being monitored Patient Re-evaluated:Patient Re-evaluated prior to induction Oxygen Delivery Method: Circle System Utilized Preoxygenation: Pre-oxygenation with 100% oxygen Induction Type: IV induction Ventilation: Mask ventilation without difficulty LMA: LMA with gastric port inserted LMA Size: 5.0 Number of attempts: 1 Placement Confirmation: positive ETCO2 Tube secured with: Tape Dental Injury: Teeth and Oropharynx as per pre-operative assessment

## 2019-07-31 NOTE — Progress Notes (Signed)
Pt alert and oriented x3 no complaints of pain or discomfort.  Bed in low position, call bell within reach.  Bed alarms on and functioning.  Pt is being prepared to go down for OR.  Bath with wipes done.

## 2019-07-31 NOTE — Progress Notes (Signed)
PROGRESS NOTE  Joseph Hoffman [id number] DOB: May 28, 1951 DOA: 07/29/2019 PCP: Orpah Melter, MD  Brief History   The patient presented to [facility] with complaints of infection at the site of his recent amputations of toes on the left foot.  Joseph Hoffman is a 68 y.o. male with medical history significant of hypertension, diabetes mellitus type 2, asthma, systolic congestive heart failure with EF of 30 to 35%, coronary artery disease, atrial fibrillation on warfarin, aortic stenosis status post AVR with mechanical valve on Coumadin needing replacement, obstructive sleep apnea on CPAP, chronic kidney disease stage III, depression with anxiety who presented to the hospital today having been sent in by Dr. Ainsley Spinner office.  Went into the vascular office today for follow-up after his right amputation of left great toe by Dr. Carlis Abbott on 07/03/2019.  His wound was noted to be erythematous and swollen with purulent drainage.  He was referred to [facility] for direct admission due to his complicated medical history.  Patient will need bridging heparin prior to surgery.  Unfortunately the patient awoke this morning and had some nausea and vomiting with associated diarrhea.  He states that whenever he gets antibiotics he gets diarrhea.  He has not had any diarrhea prior to today.  While I was examining him patient had a momentary episode of fecal urgency which resolved.  Patient is concerned that he will be getting antibiotics and will have diarrhea.  I have ordered stool studies given that he was on vancomycin and Rocephin less than a month ago. He denies chest pain, shortness of breath, cough, fever, sputum production, dysuria, urinary frequency, abdominal pain, palpitations, blurry vision, double vision, unilateral weakness or numbness.  The patient was admitted to a telemetry bed. Vascular surgery is consulted. He was started on IV Vancomycin. And has been continued on his home  medications as possible.  The patient underwent BKA with Dr. Carlis Abbott today. He has tolerated the procedure well.  Consultants  . Vascular surgery  Procedures  . BKA  Antibiotics   Anti-infectives (From admission, onward)   Start     Dose/Rate Route Frequency Ordered Stop   07/30/19 1800  vancomycin (VANCOCIN) 1,750 mg in sodium chloride 0.9 % 500 mL IVPB     1,750 mg 250 mL/hr over 120 Minutes Intravenous Every 24 hours 07/29/19 1757     07/29/19 1900  metroNIDAZOLE (FLAGYL) IVPB 500 mg     500 mg 100 mL/hr over 60 Minutes Intravenous Every 8 hours 07/29/19 1807     07/29/19 1830  cefTRIAXone (ROCEPHIN) 1 g in sodium chloride 0.9 % 100 mL IVPB     1 g 200 mL/hr over 30 Minutes Intravenous Every 24 hours 07/29/19 1803     07/29/19 1800  vancomycin (VANCOCIN) 2,000 mg in sodium chloride 0.9 % 500 mL IVPB     2,000 mg 250 mL/hr over 120 Minutes Intravenous  Once 07/29/19 1757 07/29/19 2255     Subjective  The patient is resting comfortably. No new complaints.  Objective   Vitals:  Vitals:   07/31/19 1230 07/31/19 1726  BP: (!) 150/68   Pulse: 92   Resp: 19   Temp:  98.5 F (36.9 C)  SpO2: 97%     Exam:  Constitutional:  . The patient is somnolent after returning from PACU. No acute distress. Respiratory:  . No increased work of breathing.  . No wheezes, rales, or rhonchi.  . No tactile fremitus Cardiovascular:  . Regular rate and  rhythm. . No murmurs, ectopy, or gallups. . No lateral PMI. No thrills. Abdomen:  . Abdomen is soft, non-tender, non-distended. . No hernias, masses, or organomegaly . Normoactive bowel sounds Musculoskeletal:  . No cyanosis, clubbing or edema of right lower extremity and foot. Left foot /great toe is bandaged. Skin:  . No rashes, lesions, ulcers . palpation of skin: no induration or nodules Neurologic:  . CN 2-12 intact . Sensation all 4 extremities intact Psychiatric:  . Mental status o Mood, affect appropriate o  Orientation to person, place, time  . judgment and insight appear intact     I have personally reviewed the following:   Today's Data  . Vitals, BMP, CBC  Scheduled Meds: . B-complex with vitamin C  1 tablet Oral Daily  . cholecalciferol  1,000 Units Oral Q breakfast  . digoxin  0.125 mg Oral Daily  . escitalopram  10 mg Oral QHS  . ezetimibe  10 mg Oral Daily  . fenofibrate  160 mg Oral Q breakfast  . fentaNYL      . glimepiride  2 mg Oral Q breakfast  . HYDROmorphone      . HYDROmorphone      . insulin aspart  0-20 Units Subcutaneous TID WC  . insulin aspart  0-5 Units Subcutaneous QHS  . insulin glargine  30 Units Subcutaneous BID  . ketorolac      . metoprolol succinate  25 mg Oral BID  . mometasone-formoterol  2 puff Inhalation BID  . saccharomyces boulardii  250 mg Oral 3 times weekly  . sacubitril-valsartan  1 tablet Oral BID  . sodium chloride flush  3 mL Intravenous Q12H  . sodium chloride flush  3 mL Intravenous Q12H  . torsemide  40 mg Oral Daily   And  . torsemide  20 mg Oral Daily  . vitamin C  500 mg Oral BID   Continuous Infusions: . sodium chloride    . cefTRIAXone (ROCEPHIN)  IV Stopped (07/30/19 2207)  . [START ON 08/01/2019] heparin    . lactated ringers 10 mL/hr at 07/31/19 1750  . metronidazole Stopped (07/31/19 1648)  . vancomycin Stopped (07/30/19 2053)    Principal Problem:   Diabetic ulcer of left foot associated with diabetes mellitus due to underlying condition [facility]) Active Problems:   Permanent atrial fibrillation, since 1994   OSA on CPAP   Chronic anticoagulation   Essential hypertension   CRI (chronic renal insufficiency), stage 3 (moderate) (HCC)   Type II diabetes mellitus with renal manifestations (HCC)   CKD (chronic kidney disease), stage III (HCC)   H/O heart valve replacement with mechanical valve   Foot ulcer, left (HCC)   CAD, multiple vessel   PAD (peripheral artery disease) (HCC)   Nausea & vomiting   Diarrhea   Left  ventricular ejection fraction of 30% to 35%   LOS: 2 days   A & P   Foot ulcer of left foot associated with diabetes mellitus due to underlying peripheral artery disease: Patient did not heal well postoperatively.  He has underlying peripheral vascular disease and now is having pus draining from the wound.  His foot is painful and Dr. Carlis Abbott has determined that he will require a below the knee amputation.  I am starting him on vancomycin and Ceftriaxone  to treat any infection prior to surgery. Pt underwent surgery for BKA today. The patient has tolerated the procedure well.  History of heart valve replacement with mechanical aortic valve on chronic anticoagulation  also with permanent atrial fibrillation: Warfarin will be discontinued we will start him on a heparin drip to bridge him until surgery.  Postoperatively he will restart Coumadin.  He will likely need bridgeing for his warfarin postoperatively.  Nausea vomiting and diarrhea: Resolved per patient.  I am concerned that he may have a viral gastroenteritis will check stool studies also check stool for C. difficile as he did receive vancomycin and ceftriaxone last hospitalization less than a month ago. However, the tests have not been performed, because the patient has not produced any stool.  Patient already takes a probiotic.  Would recommend that he try adding yogurt to that. Monitor.  Systolic congestive heart failure with left ventricular ejection fraction of 30 to 35%: Noted. Fluid balance negative by 300cc since admission.  Chronic renal insufficiency stage III: Noted avoid nephrotoxic agents or hypotension. Creatinine is stable at 1.86. Monitor creatinine electrolyes, and volume status.  Coronary artery disease of multiple vessels: Patient not only needs an aortic valve replacement but he tells me he is in need of a two-vessel bypass when his valve is replaced.  Plan is to treat his underlying foot infection first and then proceed  with valve replacement and CABG at a later date.  Obstructive sleep apnea on CPAP: We will ask patient to bring in his machine and use home settings.  Essential hypertension: Noted blood pressure management as per home medications.  I have seen and examined this patient myself. I have spent 32 minutes in his evaluation and care.  DVT prophylaxis: IV heparin Code Status: Full code Family Communication: None at bedside. Disposition Plan: Likely home in approximately a week  Dorance Spink, DO [facility] Direct contact: see [email or web address]  7PM-7AM contact night coverage as above 07/31/2019, 3:55 PM  LOS: 1 day

## 2019-08-01 LAB — CBC WITH DIFFERENTIAL/PLATELET
Abs Immature Granulocytes: 0.11 10*3/uL — ABNORMAL HIGH (ref 0.00–0.07)
Basophils Absolute: 0 10*3/uL (ref 0.0–0.1)
Basophils Relative: 1 %
Eosinophils Absolute: 0.4 10*3/uL (ref 0.0–0.5)
Eosinophils Relative: 4 %
HCT: 29.9 % — ABNORMAL LOW (ref 39.0–52.0)
Hemoglobin: 9.5 g/dL — ABNORMAL LOW (ref 13.0–17.0)
Immature Granulocytes: 1 %
Lymphocytes Relative: 4 %
Lymphs Abs: 0.3 10*3/uL — ABNORMAL LOW (ref 0.7–4.0)
MCH: 26.2 pg (ref 26.0–34.0)
MCHC: 31.8 g/dL (ref 30.0–36.0)
MCV: 82.6 fL (ref 80.0–100.0)
Monocytes Absolute: 0.7 10*3/uL (ref 0.1–1.0)
Monocytes Relative: 9 %
Neutro Abs: 7.1 10*3/uL (ref 1.7–7.7)
Neutrophils Relative %: 81 %
Platelets: 210 10*3/uL (ref 150–400)
RBC: 3.62 MIL/uL — ABNORMAL LOW (ref 4.22–5.81)
RDW: 17.5 % — ABNORMAL HIGH (ref 11.5–15.5)
WBC: 8.6 10*3/uL (ref 4.0–10.5)
nRBC: 0 % (ref 0.0–0.2)

## 2019-08-01 LAB — GLUCOSE, CAPILLARY
Glucose-Capillary: 251 mg/dL — ABNORMAL HIGH (ref 70–99)
Glucose-Capillary: 319 mg/dL — ABNORMAL HIGH (ref 70–99)
Glucose-Capillary: 290 mg/dL — ABNORMAL HIGH (ref 70–99)
Glucose-Capillary: 266 mg/dL — ABNORMAL HIGH (ref 70–99)

## 2019-08-01 LAB — BASIC METABOLIC PANEL
Anion gap: 10 (ref 5–15)
BUN: 55 mg/dL — ABNORMAL HIGH (ref 8–23)
CO2: 24 mmol/L (ref 22–32)
Calcium: 8.5 mg/dL — ABNORMAL LOW (ref 8.9–10.3)
Chloride: 98 mmol/L (ref 98–111)
Creatinine, Ser: 1.83 mg/dL — ABNORMAL HIGH (ref 0.61–1.24)
GFR calc Af Amer: 42 mL/min — ABNORMAL LOW (ref >60)
GFR calc non Af Amer: 37 mL/min — ABNORMAL LOW (ref >60)
Glucose, Bld: 310 mg/dL — ABNORMAL HIGH (ref 70–99)
Potassium: 4.3 mmol/L (ref 3.5–5.1)
Sodium: 132 mmol/L — ABNORMAL LOW (ref 135–145)

## 2019-08-01 LAB — HEPARIN LEVEL (UNFRACTIONATED)
Heparin Unfractionated: 0.15 IU/mL — ABNORMAL LOW (ref 0.30–0.70)
Heparin Unfractionated: 0.14 IU/mL — ABNORMAL LOW (ref 0.30–0.70)

## 2019-08-01 MED ORDER — ONDANSETRON HCL 4 MG/2ML IJ SOLN: 4.0000 mg | Freq: Four times a day (QID) | INTRAMUSCULAR | Status: DC | PRN

## 2019-08-01 MED ORDER — NALOXONE HCL 0.4 MG/ML IJ SOLN: 0.4000 mg | INTRAMUSCULAR | Status: DC | PRN

## 2019-08-01 MED ORDER — DIPHENHYDRAMINE HCL 50 MG/ML IJ SOLN: 12.5000 mg | Freq: Four times a day (QID) | INTRAMUSCULAR | Status: DC | PRN

## 2019-08-01 MED ORDER — SODIUM CHLORIDE 0.9% FLUSH: 9.0000 mL | INTRAVENOUS | Status: DC | PRN

## 2019-08-01 MED ORDER — DIPHENHYDRAMINE HCL 12.5 MG/5ML PO ELIX: 12.5000 mg | ORAL_SOLUTION | Freq: Four times a day (QID) | ORAL | Status: DC | PRN

## 2019-08-01 MED ORDER — GABAPENTIN 300 MG PO CAPS
300.0000 mg | ORAL_CAPSULE | Freq: Three times a day (TID) | ORAL | Status: DC
  Administered 2019-08-01 – 2019-08-05 (×12): 300 mg via ORAL
  Filled 2019-08-01 (×13): qty 1

## 2019-08-01 MED ORDER — HYDROMORPHONE 1 MG/ML IV SOLN
INTRAVENOUS | Status: DC
  Administered 2019-08-01: 1.7 mg via INTRAVENOUS
  Administered 2019-08-01: 30 mg via INTRAVENOUS
  Administered 2019-08-03: 3.6 mg via INTRAVENOUS
  Filled 2019-08-01: qty 30

## 2019-08-01 NOTE — Progress Notes (Signed)
ANTICOAGULATION CONSULT NOTE - Follow Up Consult  Pharmacy Consult for Heparin Indication: atrial fibrillation and mechanical AVR  Allergies  Allergen Reactions  . Iohexol Anaphylaxis  . Niacin And Related     Flushing with immediate realese  . Penicillins Other (See Comments)    Unknown.[name]  Did it involve swelling of the face/tongue/throat, SOB, or low BP? Unknown Did it involve sudden or severe rash/hives, skin peeling, or any reaction on the inside of your mouth or nose? Unknown Did you need to seek medical attention at a hospital or doctor's office? Unknown When did it last happen?Childhood If all above answers are "NO", may proceed with cephalosporin use.    Patient Measurements: Height: 5\' 11"  (180.3 cm) Weight: 246 lb 8 oz (111.8 kg) IBW/kg (Calculated) : 75.3 Heparin Dosing Weight: 99.2 kg  Vital Signs: Temp: 97.7 F (36.5 C) (08/29 0607) Temp Source: Oral (08/29 0607) BP: 130/69 (08/29 0616) Pulse Rate: 98 (08/29 0850)  Labs: Recent Labs    07/29/19 1726  07/30/19 0619 07/30/19 1535 07/31/19 0513 07/31/19 0931 08/01/19 0338 08/01/19 1035  HGB 12.0*  --  11.4*  --  11.0*  --  9.5*  --   HCT 38.2*  --  36.3*  --  34.3*  --  29.9*  --   PLT 239  --  227  --  209  --  210  --   APTT 48*  --  100*  --   --   --   --   --   LABPROT 25.6*  --  26.6*  --   --  17.3*  --   --   INR 2.4*  --  2.5*  --   --  1.4*  --   --   HEPARINUNFRC  --    < > 0.18* 0.23* 0.38  --   --  0.15*  CREATININE 1.98*  --  1.86*  --  1.78*  --  1.83*  --    < > = values in this interval not displayed.    Estimated Creatinine Clearance: 47.8 mL/min (A) (by C-G formula based on SCr of 1.83 mg/dL (H)).   Medications:  Medications Prior to Admission  Medication Sig Dispense Refill Last Dose  . acetaminophen (TYLENOL) 325 MG tablet Take 2 tablets (650 mg total) by mouth every 4 (four) hours as needed for pain or fever. (Patient taking differently: Take 650  mg by mouth every 4 (four) hours as needed for fever or headache (pain). )     . albuterol (PROVENTIL HFA;VENTOLIN HFA) 108 (90 BASE) MCG/ACT inhaler Inhale 2 puffs into the lungs every 6 (six) hours as needed for wheezing or shortness of breath.      [name] b complex vitamins tablet Take 1 tablet by mouth daily with breakfast.     . cholecalciferol (VITAMIN D) 1000 UNITS tablet Take 1,000 Units by mouth daily with breakfast.      . Chromium 500 MCG TABS Take 500 mcg by mouth every evening.     . digoxin (LANOXIN) 0.125 MG tablet TAKE 1 TABLET EVERY DAY (Patient taking differently: Take 0.125 mg by mouth daily. ) 90 tablet 1   . empagliflozin (JARDIANCE) 25 MG TABS tablet Take 25 mg by mouth daily. 90 tablet 1   . escitalopram (LEXAPRO) 10 MG tablet Take 10 mg by mouth at bedtime.      [name] ezetimibe (ZETIA) 10 MG tablet Take 1 tablet (10 mg total) by mouth daily.  90 tablet 3   . fenofibrate 160 MG tablet Take 1 tablet (160 mg total) by mouth daily. (Patient taking differently: Take 160 mg by mouth daily with breakfast. ) 30 tablet 1   . Fluticasone-Salmeterol (ADVAIR) 250-50 MCG/DOSE AEPB Inhale 1 puff into the lungs 2 (two) times daily as needed (shortness of breath/asthma related symptoms).      [name] glimepiride (AMARYL) 2 MG tablet Take 2 mg by mouth daily with breakfast.      . Glucosamine-Chondroit-Vit C-Mn (GLUCOSAMINE 1500 COMPLEX PO) Take 1 tablet by mouth daily as needed (arthritis flare up).      Vanessa Kick Ethyl (VASCEPA) 1 g CAPS Take 2 capsules (2 g total) by mouth 2 (two) times daily. (Patient taking differently: Take 2 g by mouth 2 (two) times daily with a meal. ) 360 capsule 3   . insulin glargine (LANTUS) 100 UNIT/ML injection Inject 15-40 Units into the skin See admin instructions. Inject 15 - 40 units subcutaneously daily before breakfast (adjusted per CBG); inject 30-40 units at bedtime ( occasionally adjusted per CBG - usually 35 units)     . liraglutide (VICTOZA) 18 MG/3ML SOPN Inject 1.8  mg into the skin at bedtime.     . metFORMIN (GLUCOPHAGE) 500 MG tablet Take 2,000 mg by mouth daily with supper.     . metoprolol succinate (TOPROL XL) 25 MG 24 hr tablet Take 1 tablet (25 mg total) by mouth 2 (two) times daily. 90 tablet 1   . nitroGLYCERIN (NITROSTAT) 0.4 MG SL tablet Place 1 tablet (0.4 mg total) under the tongue every 5 (five) minutes as needed for chest pain. (Patient taking differently: Place 0.4 mg under the tongue every 5 (five) minutes x 3 doses as needed for chest pain. ) 30 tablet 2   . saccharomyces boulardii (FLORASTOR) 250 MG capsule Take 250 mg by mouth 3 (three) times a week.      . sacubitril-valsartan (ENTRESTO) 24-26 MG Take 1 tablet by mouth 2 (two) times daily. 60 tablet 3   . torsemide (DEMADEX) 20 MG tablet Take 2 tablets (40 mg total) by mouth every morning. (Patient taking differently: Take 20-40 mg by mouth See admin instructions. Take 2 tablets (40 mg) by mouth daily in the morning & take 1 tablet (20 mg) by mouth at night) 60 tablet 3   . vitamin C (ASCORBIC ACID) 500 MG tablet Take 500 mg by mouth 2 (two) times daily.     [name] warfarin (COUMADIN) 10 MG tablet Take 10 mg by mouth at bedtime. Take 1 tablet (10 mg) by mouth with 0.5 tablet 5 mg (2.5 mg) on Tuesdays, Thursdays, & Saturdays; ONLY take 1 tablet (10 mg) by mouth on Sundays, Mondays, Wednesdays, & Fridays.     [name] warfarin (COUMADIN) 5 MG tablet Take as instructed with 10 mg. (Patient taking differently: Take 2.5 mg by mouth See admin instructions. Take 0.5 tablet (2.5 mg) by mouth with 1 tablet 10 mg (12.5 mg) on Tuesdays, Thursdays, & Saturdays at bedtime) 90 tablet 1   . B-D ULTRAFINE III SHORT PEN 31G X 8 MM MISC      . EASY TOUCH LANCETS 30G/TWIST MISC      . enoxaparin (LOVENOX) 120 MG/0.8ML injection Inject 0.8 mLs (120 mg total) into the skin 2 (two) times daily for 5 days. (Patient not taking: Reported on 07/29/2019) 8 mL 0 Not Taking at Unknown time   Scheduled:  . B-complex with vitamin C  1  tablet Oral Daily  .  cholecalciferol  1,000 Units Oral Q breakfast  . digoxin  0.125 mg Oral Daily  . escitalopram  10 mg Oral QHS  . ezetimibe  10 mg Oral Daily  . fenofibrate  160 mg Oral Q breakfast  . gabapentin  300 mg Oral TID  . glimepiride  2 mg Oral Q breakfast  . HYDROmorphone   Intravenous Q4H  . insulin aspart  0-20 Units Subcutaneous TID WC  . insulin aspart  0-5 Units Subcutaneous QHS  . insulin glargine  30 Units Subcutaneous BID  . metoprolol succinate  25 mg Oral BID  . mometasone-formoterol  2 puff Inhalation BID  . saccharomyces boulardii  250 mg Oral 3 times weekly  . sacubitril-valsartan  1 tablet Oral BID  . sodium chloride flush  3 mL Intravenous Q12H  . sodium chloride flush  3 mL Intravenous Q12H  . torsemide  40 mg Oral Daily   And  . torsemide  20 mg Oral Daily  . vitamin C  500 mg Oral BID    Assessment: 68 yo M on warfarin PTA for mechanical AVR + afib.  Warfarin is being held and bridge initiated with heparin. Patient is now  S/p left BKA.  Patient previously required high dose (2050 units/hr) of heparin to achieve therapeutic goal. INR was 2.7 at outpatient coumadin appointment on 8/25. 8/28 INR was 1.4. Last warfarin dose was 8/27. Warfarin remains on hold today.  Heparin level is subtherapeutic at 0.15. Hgb low at 9.5 from 11, plts remain WNL. No overt bleeding or complications per RN.   Warfarin dose PTA = 10 mg daily except 12.5mg  TTS  Goal of Therapy:  INR 2-3 Heparin level 0.3-0.7 units/ml Monitor platelets by anticoagulation protocol: Yes   Plan:  - Increase heparin to 2350 units/hr - Check heparin level in 6 hrs  - Daily heparin level and CBC. - Continue to hold warfarin- f/u resume   [name] Service, PharmD PGY1 Pharmacy Resident Phone: [phone] 08/01/2019  11:23 AM  Please check [email or web address] for unit-specific pharmacy phone numbers.

## 2019-08-01 NOTE — Progress Notes (Signed)
PROGRESS NOTE  Joseph Hoffman [id number] DOB: [birth year] DOA: 07/29/2019 PCP: Orpah Melter, MD  Brief History   The patient presented to [facility] with complaints of infection at the site of his recent amputations of toes on the left foot.  Joseph Hoffman is a 68 y.o. male with medical history significant of hypertension, diabetes mellitus type 2, asthma, systolic congestive heart failure with EF of 30 to 35%, coronary artery disease, atrial fibrillation on warfarin, aortic stenosis status post AVR with mechanical valve on Coumadin needing replacement, obstructive sleep apnea on CPAP, chronic kidney disease stage III, depression with anxiety who presented to the hospital today having been sent in by Dr. Ainsley Spinner office.  Went into the vascular office today for follow-up after his right amputation of left great toe by Dr. Carlis Abbott on 07/03/2019.  His wound was noted to be erythematous and swollen with purulent drainage.  He was referred to [facility] for direct admission due to his complicated medical history.  Patient will need bridging heparin prior to surgery.  Unfortunately the patient awoke this morning and had some nausea and vomiting with associated diarrhea.  He states that whenever he gets antibiotics he gets diarrhea.  He has not had any diarrhea prior to today.  While I was examining him patient had a momentary episode of fecal urgency which resolved.  Patient is concerned that he will be getting antibiotics and will have diarrhea.  I have ordered stool studies given that he was on vancomycin and Rocephin less than a month ago. He denies chest pain, shortness of breath, cough, fever, sputum production, dysuria, urinary frequency, abdominal pain, palpitations, blurry vision, double vision, unilateral weakness or numbness.  The patient was admitted to a telemetry bed. Vascular surgery is consulted. He was started on IV Vancomycin. And has been continued on his home  medications as possible.  The patient underwent BKA with Dr. Carlis Abbott today. He has tolerated the procedure well, but complains of excruciating pain today.  Consultants  . Vascular surgery  Procedures  . BKA  Antibiotics   Anti-infectives (From admission, onward)   Start     Dose/Rate Route Frequency Ordered Stop   07/30/19 1800  vancomycin (VANCOCIN) 1,750 mg in sodium chloride 0.9 % 500 mL IVPB  Status:  Discontinued     1,750 mg 250 mL/hr over 120 Minutes Intravenous Every 24 hours 07/29/19 1757 08/01/19 1213   07/29/19 1900  metroNIDAZOLE (FLAGYL) IVPB 500 mg  Status:  Discontinued     500 mg 100 mL/hr over 60 Minutes Intravenous Every 8 hours 07/29/19 1807 08/01/19 1213   07/29/19 1830  cefTRIAXone (ROCEPHIN) 1 g in sodium chloride 0.9 % 100 mL IVPB     1 g 200 mL/hr over 30 Minutes Intravenous Every 24 hours 07/29/19 1803     07/29/19 1800  vancomycin (VANCOCIN) 2,000 mg in sodium chloride 0.9 % 500 mL IVPB     2,000 mg 250 mL/hr over 120 Minutes Intravenous  Once 07/29/19 1757 07/29/19 2255     Subjective  The patient is resting comfortably. Complaining of excruciating pain at the site of the left amputation.  Objective   Vitals:  Vitals:   08/01/19 1412 08/01/19 1450  BP:  126/65  Pulse:    Resp:  16  Temp: 98.3 F (36.8 C)   SpO2:  96%    Exam:  Constitutional:  . The patient is awake, alert, and oriented x 3. Severe distress from pain. Respiratory:  .  No increased work of breathing.  . No wheezes, rales, or rhonchi.  . No tactile fremitus Cardiovascular:  . Regular rate and rhythm. . No murmurs, ectopy, or gallups. . No lateral PMI. No thrills. Abdomen:  . Abdomen is soft, non-tender, non-distended. . No hernias, masses, or organomegaly . Normoactive bowel sounds Musculoskeletal:  No cyanosis, clubbing or edema of right lower extremity and foot. Left stump in bandaged. Skin:  . No rashes, lesions, ulcers . palpation of skin: no induration or  nodules Neurologic:  . CN 2-12 intact . Sensation all 4 extremities intact Psychiatric:  . Mental status o Mood, affect appropriate o Orientation to person, place, time  . judgment and insight appear intact     I have personally reviewed the following:   Today's Data  . Vitals, BMP, CBC  Scheduled Meds: . B-complex with vitamin C  1 tablet Oral Daily  . cholecalciferol  1,000 Units Oral Q breakfast  . digoxin  0.125 mg Oral Daily  . escitalopram  10 mg Oral QHS  . ezetimibe  10 mg Oral Daily  . fenofibrate  160 mg Oral Q breakfast  . gabapentin  300 mg Oral TID  . glimepiride  2 mg Oral Q breakfast  . HYDROmorphone   Intravenous Q4H  . insulin aspart  0-20 Units Subcutaneous TID WC  . insulin aspart  0-5 Units Subcutaneous QHS  . insulin glargine  30 Units Subcutaneous BID  . metoprolol succinate  25 mg Oral BID  . mometasone-formoterol  2 puff Inhalation BID  . saccharomyces boulardii  250 mg Oral 3 times weekly  . sacubitril-valsartan  1 tablet Oral BID  . sodium chloride flush  3 mL Intravenous Q12H  . sodium chloride flush  3 mL Intravenous Q12H  . torsemide  40 mg Oral Daily   And  . torsemide  20 mg Oral Daily  . vitamin C  500 mg Oral BID   Continuous Infusions: . sodium chloride    . sodium chloride 250 mL (08/01/19 0009)  . cefTRIAXone (ROCEPHIN)  IV Stopped (07/31/19 2237)  . heparin 2,350 Units/hr (08/01/19 1201)  . lactated ringers 10 mL/hr at 07/31/19 1750    Principal Problem:   Diabetic ulcer of left foot associated with diabetes mellitus due to underlying condition [facility]) Active Problems:   Permanent atrial fibrillation, since 1994   OSA on CPAP   Chronic anticoagulation   Essential hypertension   CRI (chronic renal insufficiency), stage 3 (moderate) (HCC)   Type II diabetes mellitus with renal manifestations (HCC)   CKD (chronic kidney disease), stage III (HCC)   H/O heart valve replacement with mechanical valve   Foot ulcer, left (HCC)    CAD, multiple vessel   PAD (peripheral artery disease) (HCC)   Nausea & vomiting   Diarrhea   Left ventricular ejection fraction of 30% to 35%   LOS: 3 days   A & P   Foot ulcer of left foot associated with diabetes mellitus due to underlying peripheral artery disease: Patient did not heal well postoperatively.  He has underlying peripheral vascular disease and now is having pus draining from the wound.  His foot is painful and Dr. Carlis Abbott has determined that he will require a below the knee amputation.  I am starting him on vancomycin and Ceftriaxone  to treat any infection prior to surgery. Pt underwent surgery for BKA today. The patient has tolerated the procedure well ,but today is having pain that is not relieved by morphine  and oxycodone. I have added neurontin, but will leave titration of narcotic pain relief to vascular surgery.  History of heart valve replacement with mechanical aortic valve on chronic anticoagulation also with permanent atrial fibrillation: Warfarin will be discontinued we will start him on a heparin drip to bridge him until surgery.  Postoperatively he will restart Coumadin.  He will likely need bridgeing for his warfarin postoperatively. Heparin infusion has been continued.  Nausea vomiting and diarrhea: Resolved per patient.  I am concerned that he may have a viral gastroenteritis will check stool studies also check stool for C. difficile as he did receive vancomycin and ceftriaxone last hospitalization less than a month ago. However, the tests have not been performed, because the patient has not produced any stool.  Patient already takes a probiotic.  Would recommend that he try adding yogurt to that. Monitor.  Systolic congestive heart failure with left ventricular ejection fraction of 30 to 35%: Noted. Fluid balance negative by 420cc since admission.  Chronic renal insufficiency stage III: Noted avoid nephrotoxic agents or hypotension. Creatinine is stable at 1.83.  Monitor creatinine electrolyes, and volume status.  Coronary artery disease of multiple vessels: Patient not only needs an aortic valve replacement but he tells me he is in need of a two-vessel bypass when his valve is replaced.  Plan is to treat his underlying foot infection first and then proceed with valve replacement and CABG at a later date.  Obstructive sleep apnea on CPAP: We will ask patient to bring in his machine and use home settings.  Essential hypertension: Noted blood pressure management as per home medications.  I have seen and examined this patient myself. I have spent 34 minutes in his evaluation and care.  DVT prophylaxis: IV heparin Code Status: Full code Family Communication: None at bedside. Disposition Plan: Likely home in approximately a week  Laquonda Welby, DO [facility] Direct contact: see [email or web address]  7PM-7AM contact night coverage as above 08/01/2019, 3:15 PM  LOS: 1 day

## 2019-08-01 NOTE — Progress Notes (Signed)
ANTICOAGULATION CONSULT NOTE - Follow Up Consult  Pharmacy Consult for Heparin Indication: atrial fibrillation and mechanical AVR  Allergies  Allergen Reactions  . Iohexol Anaphylaxis  . Niacin And Related     Flushing with immediate realese  . Penicillins Other (See Comments)    Unknown.[name]  Did it involve swelling of the face/tongue/throat, SOB, or low BP? Unknown Did it involve sudden or severe rash/hives, skin peeling, or any reaction on the inside of your mouth or nose? Unknown Did you need to seek medical attention at a hospital or doctor's office? Unknown When did it last happen?Childhood If all above answers are "NO", may proceed with cephalosporin use.    Patient Measurements: Height: 5\' 11"  (180.3 cm) Weight: 246 lb 8 oz (111.8 kg) IBW/kg (Calculated) : 75.3 Heparin Dosing Weight: 99.2 kg  Vital Signs: Temp: 98.3 F (36.8 C) (08/29 1412) Temp Source: Oral (08/29 1412) BP: 110/56 (08/29 1551) Pulse Rate: 98 (08/29 0850)  Labs: Recent Labs    07/30/19 0619  07/31/19 0513 07/31/19 0931 08/01/19 0338 08/01/19 1035 08/01/19 1754  HGB 11.4*  --  11.0*  --  9.5*  --   --   HCT 36.3*  --  34.3*  --  29.9*  --   --   PLT 227  --  209  --  210  --   --   APTT 100*  --   --   --   --   --   --   LABPROT 26.6*  --   --  17.3*  --   --   --   INR 2.5*  --   --  1.4*  --   --   --   HEPARINUNFRC 0.18*   < > 0.38  --   --  0.15* 0.14*  CREATININE 1.86*  --  1.78*  --  1.83*  --   --    < > = values in this interval not displayed.    Estimated Creatinine Clearance: 47.8 mL/min (A) (by C-G formula based on SCr of 1.83 mg/dL (H)).  Assessment: 68 yo M on warfarin PTA for mechanical AVR + afib.  Warfarin is being held and bridge initiated with heparin. Patient is now s/p left BKA.  Patient previously required high dose (2050 units/hr) of heparin to achieve therapeutic goal. INR was 2.7 at outpatient coumadin appointment on 8/25. 8/28 INR was  1.4. Last warfarin dose was 8/27. Warfarin remains on hold today.  Warfarin dose PTA = 10 mg daily except 12.5mg  TTS  PM heparin level remains subtherapeutic at 0.14 units/mL. Hgb decreased to 9.5, Pltc remains WNL. No overt bleeding or infusion issues noted per nursing.      Goal of Therapy:  INR 2-3 Heparin level 0.3-0.7 units/ml Monitor platelets by anticoagulation protocol: Yes   Plan:  - Increase heparin infusion to 2650 units/hr - Check heparin level in 8 hours  - Daily heparin level and CBC  - Monitor closely for s/sx of bleeding   Lindell Spar, PharmD, BCPS Clinical Pharmacist  08/01/2019  6:50 PM

## 2019-08-01 NOTE — Progress Notes (Signed)
   VASCULAR SURGERY ASSESSMENT & PLAN:   1 Day Post-Op s/p: Left below the knee amputation.  He was having pain today so I inspected his wound which looks fine.  I have given him a PCA for pain.  Begin daily dressing changes tomorrow.  SUBJECTIVE:   Earlier today was complaining of significant pain at the amputation site  PHYSICAL EXAM:   Vitals:   08/01/19 1450 08/01/19 1551 08/01/19 1624 08/01/19 1643  BP: 126/65 (!) 110/56    Pulse:      Resp: 16  18 17   Temp:      TempSrc:      SpO2: 96%  98% 98%  Weight:      Height:       I inspected his amputation site looks fine.  There is minimal drainage and it appears adequately perfused.  LABS:   Lab Results  Component Value Date   WBC 8.6 08/01/2019   HGB 9.5 (L) 08/01/2019   HCT 29.9 (L) 08/01/2019   MCV 82.6 08/01/2019   PLT 210 08/01/2019   Lab Results  Component Value Date   CREATININE 1.83 (H) 08/01/2019   Lab Results  Component Value Date   INR 1.4 (H) 07/31/2019   CBG (last 3)  Recent Labs    08/01/19 0741 08/01/19 1128 08/01/19 1821  GLUCAP 251* 319* 290*    PROBLEM LIST:    Principal Problem:   Diabetic ulcer of left foot associated with diabetes mellitus due to underlying condition (HCC) Active Problems:   Permanent atrial fibrillation, since 1994   OSA on CPAP   Chronic anticoagulation   Essential hypertension   CRI (chronic renal insufficiency), stage 3 (moderate) (HCC)   Type II diabetes mellitus with renal manifestations (HCC)   CKD (chronic kidney disease), stage III (HCC)   H/O heart valve replacement with mechanical valve   Foot ulcer, left (HCC)   CAD, multiple vessel   PAD (peripheral artery disease) (HCC)   Nausea & vomiting   Diarrhea   Left ventricular ejection fraction of 30% to 35%   CURRENT MEDS:   . B-complex with vitamin C  1 tablet Oral Daily  . cholecalciferol  1,000 Units Oral Q breakfast  . digoxin  0.125 mg Oral Daily  . escitalopram  10 mg Oral QHS  .  ezetimibe  10 mg Oral Daily  . fenofibrate  160 mg Oral Q breakfast  . gabapentin  300 mg Oral TID  . glimepiride  2 mg Oral Q breakfast  . HYDROmorphone   Intravenous Q4H  . insulin aspart  0-20 Units Subcutaneous TID WC  . insulin aspart  0-5 Units Subcutaneous QHS  . insulin glargine  30 Units Subcutaneous BID  . metoprolol succinate  25 mg Oral BID  . mometasone-formoterol  2 puff Inhalation BID  . saccharomyces boulardii  250 mg Oral 3 times weekly  . sacubitril-valsartan  1 tablet Oral BID  . sodium chloride flush  3 mL Intravenous Q12H  . sodium chloride flush  3 mL Intravenous Q12H  . torsemide  40 mg Oral Daily   And  . torsemide  20 mg Oral Daily  . vitamin C  500 mg Oral BID    Staves: [id number] Office: [phone] 08/01/2019

## 2019-08-02 LAB — BASIC METABOLIC PANEL
Anion gap: 11 (ref 5–15)
BUN: 65 mg/dL — ABNORMAL HIGH (ref 8–23)
CO2: 22 mmol/L (ref 22–32)
Calcium: 8.5 mg/dL — ABNORMAL LOW (ref 8.9–10.3)
Chloride: 98 mmol/L (ref 98–111)
Creatinine, Ser: 2.21 mg/dL — ABNORMAL HIGH (ref 0.61–1.24)
GFR calc Af Amer: 34 mL/min — ABNORMAL LOW (ref >60)
GFR calc non Af Amer: 29 mL/min — ABNORMAL LOW (ref >60)
Glucose, Bld: 217 mg/dL — ABNORMAL HIGH (ref 70–99)
Potassium: 4.4 mmol/L (ref 3.5–5.1)
Sodium: 131 mmol/L — ABNORMAL LOW (ref 135–145)

## 2019-08-02 LAB — CBC
HCT: 30.4 % — ABNORMAL LOW (ref 39.0–52.0)
Hemoglobin: 9.7 g/dL — ABNORMAL LOW (ref 13.0–17.0)
MCH: 26.1 pg (ref 26.0–34.0)
MCHC: 31.9 g/dL (ref 30.0–36.0)
MCV: 81.9 fL (ref 80.0–100.0)
Platelets: 225 10*3/uL (ref 150–400)
RBC: 3.71 MIL/uL — ABNORMAL LOW (ref 4.22–5.81)
RDW: 17.8 % — ABNORMAL HIGH (ref 11.5–15.5)
WBC: 10.9 10*3/uL — ABNORMAL HIGH (ref 4.0–10.5)
nRBC: 0 % (ref 0.0–0.2)

## 2019-08-02 LAB — GLUCOSE, CAPILLARY
Glucose-Capillary: 179 mg/dL — ABNORMAL HIGH (ref 70–99)
Glucose-Capillary: 168 mg/dL — ABNORMAL HIGH (ref 70–99)
Glucose-Capillary: 164 mg/dL — ABNORMAL HIGH (ref 70–99)
Glucose-Capillary: 217 mg/dL — ABNORMAL HIGH (ref 70–99)

## 2019-08-02 LAB — HEPARIN LEVEL (UNFRACTIONATED)
Heparin Unfractionated: 0.51 IU/mL (ref 0.30–0.70)
Heparin Unfractionated: 0.46 IU/mL (ref 0.30–0.70)

## 2019-08-02 MED ORDER — WARFARIN - PHARMACIST DOSING INPATIENT: Freq: Every day | Status: DC

## 2019-08-02 MED ORDER — WARFARIN SODIUM 2.5 MG PO TABS
12.5000 mg | ORAL_TABLET | Freq: Once | ORAL | Status: AC
  Administered 2019-08-02: 19:00:00 12.5 mg via ORAL
  Filled 2019-08-02: qty 1

## 2019-08-02 NOTE — Progress Notes (Signed)
ANTICOAGULATION CONSULT NOTE - Follow Up Consult  Pharmacy Consult for Heparin and Warfarin Indication: atrial fibrillation and mechanical AVR  Allergies  Allergen Reactions  . Iohexol Anaphylaxis  . Niacin And Related     Flushing with immediate realese  . Penicillins Other (See Comments)    Unknown.[name]  Did it involve swelling of the face/tongue/throat, SOB, or low BP? Unknown Did it involve sudden or severe rash/hives, skin peeling, or any reaction on the inside of your mouth or nose? Unknown Did you need to seek medical attention at a hospital or doctor's office? Unknown When did it last happen?Childhood If all above answers are "NO", may proceed with cephalosporin use.    Patient Measurements: Height: 5\' 11"  (180.3 cm) Weight: 248 lb 7.3 oz (112.7 kg) IBW/kg (Calculated) : 75.3 Heparin Dosing Weight: 99.2 kg  Vital Signs: Temp: 98.2 F (36.8 C) (08/30 0754) Temp Source: Oral (08/30 0754) BP: 95/44 (08/30 1619) Pulse Rate: 34 (08/30 1619)  Labs: Recent Labs    07/31/19 0513 07/31/19 0931 08/01/19 0338  08/01/19 1754 08/02/19 0442 08/02/19 1456  HGB 11.0*  --  9.5*  --   --  9.7*  --   HCT 34.3*  --  29.9*  --   --  30.4*  --   PLT 209  --  210  --   --  225  --   LABPROT  --  17.3*  --   --   --   --   --   INR  --  1.4*  --   --   --   --   --   HEPARINUNFRC 0.38  --   --    < > 0.14* 0.51 0.46  CREATININE 1.78*  --  1.83*  --   --  2.21*  --    < > = values in this interval not displayed.    Estimated Creatinine Clearance: 39.7 mL/min (A) (by C-G formula based on SCr of 2.21 mg/dL (H)).  Assessment: 68 yo M on warfarin PTA for mechanical AVR + afib.  Warfarin was held for BKA and pt was initiated on heparin bridge.  Patient is now s/p left BKA.  OK to resume warfarin per Dr. Marthenia Rolling and Dr. Scot Dock ([facility]).  Patient received 5mg  IV Vit K to reverse INR prior to surgery so may need increased warfarin doses to overcome resistance.   Will need to continue heparin bridge until INR therapeutic.  Heparin remains therapeutic on 2650 units/hr.  No bleeding noted.  INR 1.4 (8/28)  Warfarin dose PTA = 10 mg daily except 12.5mg  TTS Confirmed INR goal 2.5-3.5 based on outpatient records (AVR + Afib)  Goal of Therapy:  INR 2.5-3.5 Heparin level 0.3-0.7 units/ml Monitor platelets by anticoagulation protocol: Yes   Plan:  - Continue heparin infusion at 2650 units/hr - Coumadin 12.5mg  PO x 1 tonight - Daily INR, heparin level, and CBC  - Monitor closely for s/sx of bleeding  [employer], Pharm.D., BCPS Clinical Pharmacist  **Pharmacist phone directory can now be found on [email or web address] ([email or web address]).  Listed under [place].  08/02/2019 4:40 PM

## 2019-08-02 NOTE — Progress Notes (Signed)
ANTICOAGULATION CONSULT NOTE - Follow Up Consult  Pharmacy Consult for Heparin Indication: atrial fibrillation and mechanical AVR  Allergies  Allergen Reactions  . Iohexol Anaphylaxis  . Niacin And Related     Flushing with immediate realese  . Penicillins Other (See Comments)    Unknown.[name]  Did it involve swelling of the face/tongue/throat, SOB, or low BP? Unknown Did it involve sudden or severe rash/hives, skin peeling, or any reaction on the inside of your mouth or nose? Unknown Did you need to seek medical attention at a hospital or doctor's office? Unknown When did it last happen?Childhood If all above answers are "NO", may proceed with cephalosporin use.    Patient Measurements: Height: 5\' 11"  (180.3 cm) Weight: 248 lb 7.3 oz (112.7 kg) IBW/kg (Calculated) : 75.3 Heparin Dosing Weight: 99.2 kg  Vital Signs: Temp: 98.5 F (36.9 C) (08/30 0440) Temp Source: Oral (08/30 0440) BP: 99/55 (08/30 0440) Pulse Rate: 72 (08/30 0440)  Labs: Recent Labs    07/31/19 0513 07/31/19 0931 08/01/19 0338 08/01/19 1035 08/01/19 1754 08/02/19 0442  HGB 11.0*  --  9.5*  --   --  9.7*  HCT 34.3*  --  29.9*  --   --  30.4*  PLT 209  --  210  --   --  225  LABPROT  --  17.3*  --   --   --   --   INR  --  1.4*  --   --   --   --   HEPARINUNFRC 0.38  --   --  0.15* 0.14* 0.51  CREATININE 1.78*  --  1.83*  --   --  2.21*    Estimated Creatinine Clearance: 39.7 mL/min (A) (by C-G formula based on SCr of 2.21 mg/dL (H)).  Assessment: 68 yo M on warfarin PTA for mechanical AVR + afib.  Warfarin is being held and bridge initiated with heparin. Patient is now s/p left BKA.  Patient previously required high dose (2050 units/hr) of heparin to achieve therapeutic goal. INR was 2.7 at outpatient coumadin appointment on 8/25. 8/28 INR was 1.4. Last warfarin dose was 8/27. Warfarin remains on hold.  Warfarin dose PTA = 10 mg daily except 12.5mg  TTS  Heparin  level therapeutic at 0.51 units/mL. Hgb 9.7, Pltc WNL. No overt bleeding per RN.    Goal of Therapy:  INR 2-3 Heparin level 0.3-0.7 units/ml Monitor platelets by anticoagulation protocol: Yes   Plan:  - Continue heparin infusion at 2650 units/hr - Check confirmatory heparin level in 8 hours  - Daily heparin level and CBC  - Monitor closely for s/sx of bleeding  [name], PharmD PGY1 Ambulatory Care Resident [employer] # [phone]

## 2019-08-02 NOTE — Progress Notes (Signed)
PROGRESS NOTE  Joseph Hoffman [id number] DOB: 1951/01/20 DOA: 07/29/2019 PCP: Joseph Melter, MD  Brief History   The patient presented to [facility] with complaints of infection at the site of his recent amputations of toes on the left foot.  Joseph Hoffman is a 68 y.o. male with medical history significant of hypertension, diabetes mellitus type 2, asthma, systolic congestive heart failure with EF of 30 to 35%, coronary artery disease, atrial fibrillation on warfarin, aortic stenosis status post AVR with mechanical valve on Coumadin needing replacement, obstructive sleep apnea on CPAP, chronic kidney disease stage III, depression with anxiety who presented to the hospital today having been sent in by Joseph Hoffman office.  Went into the vascular office today for follow-up after his right amputation of left great toe by Joseph Hoffman on 07/03/2019.  His wound was noted to be erythematous and swollen with purulent drainage.  He was referred to [facility] hospitalist for direct admission due to his complicated medical history.  Patient will need bridging heparin prior to surgery.  Unfortunately the patient awoke this morning and had some nausea and vomiting with associated diarrhea.  He states that whenever he gets antibiotics he gets diarrhea.  He has not had any diarrhea prior to today.  While I was examining him patient had a momentary episode of fecal urgency which resolved.  Patient is concerned that he will be getting antibiotics and will have diarrhea.  I have ordered stool studies given that he was on vancomycin and Rocephin less than a month ago. He denies chest pain, shortness of breath, cough, fever, sputum production, dysuria, urinary frequency, abdominal pain, palpitations, blurry vision, double vision, unilateral weakness or numbness.  The patient was admitted to a telemetry bed. Vascular surgery is consulted. He was started on IV Vancomycin. And has been continued on his home  medications as possible.  The patient underwent BKA with Joseph Hoffman today. He has tolerated the procedure well, but complains of excruciating pain today.  08/02/2019: Patient seen.  Patient seems a bit drowsy.  Will be careful with mind altering medications.  Patient is on pain medications (Dilaudid) via PCA pump.    Consultants  . Vascular surgery  Procedures  . BKA  Antibiotics   Anti-infectives (From admission, onward)   Start     Dose/Rate Route Frequency Ordered Stop   07/30/19 1800  vancomycin (VANCOCIN) 1,750 mg in sodium chloride 0.9 % 500 mL IVPB  Status:  Discontinued     1,750 mg 250 mL/hr over 120 Minutes Intravenous Every 24 hours 07/29/19 1757 08/01/19 1213   07/29/19 1900  metroNIDAZOLE (FLAGYL) IVPB 500 mg  Status:  Discontinued     500 mg 100 mL/hr over 60 Minutes Intravenous Every 8 hours 07/29/19 1807 08/01/19 1213   07/29/19 1830  cefTRIAXone (ROCEPHIN) 1 g in sodium chloride 0.9 % 100 mL IVPB     1 g 200 mL/hr over 30 Minutes Intravenous Every 24 hours 07/29/19 1803     07/29/19 1800  vancomycin (VANCOCIN) 2,000 mg in sodium chloride 0.9 % 500 mL IVPB     2,000 mg 250 mL/hr over 120 Minutes Intravenous  Once 07/29/19 1757 07/29/19 2255     Subjective  The patient is resting comfortably. Complaining of excruciating pain at the site of the left amputation.  Objective   Vitals:  Vitals:   08/02/19 0848 08/02/19 1229  BP:    Pulse:    Resp: 19 13  Temp:  SpO2: 98% 96%    Exam:  Constitutional:  . The patient is drowsy.  Patient is obese. Respiratory:  . Clear to auscultation  Cardiovascular:  . S1-S2 Abdomen:  Abdomen is obese, soft and nontender.  Organs are difficult to assess.   Neurologic:  . Drowsy.   . Patient moves all extremities.   Extremities: Patient is status post left BKA.     I have personally reviewed the following:   Today's Data  . Vitals, BMP, CBC  Scheduled Meds: . B-complex with vitamin C  1 tablet Oral Daily  .  cholecalciferol  1,000 Units Oral Q breakfast  . digoxin  0.125 mg Oral Daily  . escitalopram  10 mg Oral QHS  . ezetimibe  10 mg Oral Daily  . fenofibrate  160 mg Oral Q breakfast  . gabapentin  300 mg Oral TID  . glimepiride  2 mg Oral Q breakfast  . HYDROmorphone   Intravenous Q4H  . insulin aspart  0-20 Units Subcutaneous TID WC  . insulin aspart  0-5 Units Subcutaneous QHS  . insulin glargine  30 Units Subcutaneous BID  . metoprolol succinate  25 mg Oral BID  . mometasone-formoterol  2 puff Inhalation BID  . saccharomyces boulardii  250 mg Oral 3 times weekly  . sacubitril-valsartan  1 tablet Oral BID  . sodium chloride flush  3 mL Intravenous Q12H  . sodium chloride flush  3 mL Intravenous Q12H  . torsemide  40 mg Oral Daily   And  . torsemide  20 mg Oral Daily  . vitamin C  500 mg Oral BID   Continuous Infusions: . sodium chloride    . sodium chloride 250 mL (08/01/19 0009)  . cefTRIAXone (ROCEPHIN)  IV 1 g (08/01/19 2020)  . heparin 2,650 Units/hr (08/02/19 0350)  . lactated ringers 10 mL/hr at 07/31/19 1750    Principal Problem:   Diabetic ulcer of left foot associated with diabetes mellitus due to underlying condition [facility]) Active Problems:   Permanent atrial fibrillation, since 1994   OSA on CPAP   Chronic anticoagulation   Essential hypertension   CRI (chronic renal insufficiency), stage 3 (moderate) (HCC)   Type II diabetes mellitus with renal manifestations (HCC)   CKD (chronic kidney disease), stage III (HCC)   H/O heart valve replacement with mechanical valve   Foot ulcer, left (HCC)   CAD, multiple vessel   PAD (peripheral artery disease) (HCC)   Nausea & vomiting   Diarrhea   Left ventricular ejection fraction of 30% to 35%   LOS: 4 days   A & P   Foot ulcer of left foot associated with diabetes mellitus due to underlying peripheral artery disease: Patient did not heal well postoperatively.  He has underlying peripheral vascular disease and now is  having pus draining from the wound.  His foot is painful and Joseph Hoffman has determined that he will require a below the knee amputation.  I am starting him on vancomycin and Ceftriaxone  to treat any infection prior to surgery. Pt underwent surgery for BKA today. The patient has tolerated the procedure well ,but today is having pain that is not relieved by morphine and oxycodone. I have added neurontin, but will leave titration of narcotic pain relief to vascular surgery. 08/02/2019: Patient is status left BKA.  Will optimize pain medication to avoid excessive sedation.  History of heart valve replacement with mechanical aortic valve on chronic anticoagulation also with permanent atrial fibrillation: Warfarin will be  discontinued we will start him on a heparin drip to bridge him until surgery.  Postoperatively he will restart Coumadin.  He will likely need bridgeing for his warfarin postoperatively. Heparin infusion has been continued. 08/02/2019: We will start warfarin.  Nausea vomiting and diarrhea: Resolved per patient.  I am concerned that he may have a viral gastroenteritis will check stool studies also check stool for C. difficile as he did receive vancomycin and ceftriaxone last hospitalization less than a month ago. However, the tests have not been performed, because the patient has not produced any stool.  Patient already takes a probiotic.  Would recommend that he try adding yogurt to that. Monitor.  Systolic congestive heart failure with left ventricular ejection fraction of 30 to 35%: Noted. Fluid balance negative by 420cc since admission.  Chronic renal insufficiency stage III: Noted avoid nephrotoxic agents or hypotension. Creatinine is stable at 1.83. Monitor creatinine electrolyes, and volume status. 08/02/2019: We will be careful with opiates.  Coronary artery disease of multiple vessels: Patient not only needs an aortic valve replacement but he tells me he is in need of a two-vessel  bypass when his valve is replaced.  Plan is to treat his underlying foot infection first and then proceed with valve replacement and CABG at a later date.  Obstructive sleep apnea on CPAP: Continue home settings.   Essential hypertension: Controlled.  Mind on the low side.    DVT prophylaxis: IV heparin.  Restart warfarin Code Status: Full code Family Communication: None at bedside. Disposition Plan:  This will depend on hospital course.  Dana Allan MD  [facility] Direct contact: see [email or web address]  7PM-7AM contact night coverage as above

## 2019-08-02 NOTE — Anesthesia Postprocedure Evaluation (Signed)
Anesthesia Post Note  Patient: RONDA CHIARELLA  Procedure(s) Performed: AMPUTATION BELOW KNEE LEFT (Left Knee)     Patient location during evaluation: PACU Anesthesia Type: General Level of consciousness: awake and alert Pain control: Improving. Vital Signs Assessment: post-procedure vital signs reviewed and stable Respiratory status: spontaneous breathing, nonlabored ventilation, respiratory function stable and patient connected to nasal cannula oxygen Cardiovascular status: blood pressure returned to baseline and stable Postop Assessment: no apparent nausea or vomiting Anesthetic complications: no    Last Vitals:  Vitals:   08/02/19 1600 08/02/19 1619  BP:  (!) 95/44  Pulse:  (!) 34  Resp: 13   Temp:    SpO2: 96% 98%    Last Pain:  Vitals:   08/02/19 1600  TempSrc:   PainSc: Asleep                 Ryan P Ellender

## 2019-08-03 ENCOUNTER — Encounter (HOSPITAL_COMMUNITY): Payer: Self-pay | Admitting: Vascular Surgery

## 2019-08-03 ENCOUNTER — Other Ambulatory Visit: Payer: Self-pay

## 2019-08-03 ENCOUNTER — Inpatient Hospital Stay (HOSPITAL_COMMUNITY): Payer: Medicare Other

## 2019-08-03 ENCOUNTER — Other Ambulatory Visit (HOSPITAL_COMMUNITY): Payer: Medicare Other

## 2019-08-03 DIAGNOSIS — N179 Acute kidney failure, unspecified: Secondary | ICD-10-CM

## 2019-08-03 DIAGNOSIS — N189 Chronic kidney disease, unspecified: Secondary | ICD-10-CM

## 2019-08-03 LAB — CBC WITH DIFFERENTIAL/PLATELET
Abs Immature Granulocytes: 0.15 10*3/uL — ABNORMAL HIGH (ref 0.00–0.07)
Basophils Absolute: 0 10*3/uL (ref 0.0–0.1)
Basophils Relative: 0 %
Eosinophils Absolute: 1.1 10*3/uL — ABNORMAL HIGH (ref 0.0–0.5)
Eosinophils Relative: 10 %
HCT: 30.2 % — ABNORMAL LOW (ref 39.0–52.0)
Hemoglobin: 9.6 g/dL — ABNORMAL LOW (ref 13.0–17.0)
Immature Granulocytes: 1 %
Lymphocytes Relative: 7 %
Lymphs Abs: 0.8 10*3/uL (ref 0.7–4.0)
MCH: 26.1 pg (ref 26.0–34.0)
MCHC: 31.8 g/dL (ref 30.0–36.0)
MCV: 82.1 fL (ref 80.0–100.0)
Monocytes Absolute: 1.2 10*3/uL — ABNORMAL HIGH (ref 0.1–1.0)
Monocytes Relative: 11 %
Neutro Abs: 7.6 10*3/uL (ref 1.7–7.7)
Neutrophils Relative %: 71 %
Platelets: 225 10*3/uL (ref 150–400)
RBC: 3.68 MIL/uL — ABNORMAL LOW (ref 4.22–5.81)
RDW: 18 % — ABNORMAL HIGH (ref 11.5–15.5)
WBC: 10.9 10*3/uL — ABNORMAL HIGH (ref 4.0–10.5)
nRBC: 0 % (ref 0.0–0.2)

## 2019-08-03 LAB — URINALYSIS, ROUTINE W REFLEX MICROSCOPIC
Bilirubin Urine: NEGATIVE
Glucose, UA: 150 mg/dL — AB
Hgb urine dipstick: NEGATIVE
Ketones, ur: NEGATIVE mg/dL
Leukocytes,Ua: NEGATIVE
Nitrite: NEGATIVE
Protein, ur: NEGATIVE mg/dL
Specific Gravity, Urine: 1.008 (ref 1.005–1.030)
pH: 5 (ref 5.0–8.0)

## 2019-08-03 LAB — PROTIME-INR
INR: 1.3 — ABNORMAL HIGH (ref 0.8–1.2)
Prothrombin Time: 15.7 seconds — ABNORMAL HIGH (ref 11.4–15.2)

## 2019-08-03 LAB — GLUCOSE, CAPILLARY
Glucose-Capillary: 163 mg/dL — ABNORMAL HIGH (ref 70–99)
Glucose-Capillary: 148 mg/dL — ABNORMAL HIGH (ref 70–99)
Glucose-Capillary: 300 mg/dL — ABNORMAL HIGH (ref 70–99)
Glucose-Capillary: 284 mg/dL — ABNORMAL HIGH (ref 70–99)
Glucose-Capillary: 276 mg/dL — ABNORMAL HIGH (ref 70–99)

## 2019-08-03 LAB — RENAL FUNCTION PANEL
Albumin: 2.7 g/dL — ABNORMAL LOW (ref 3.5–5.0)
Anion gap: 11 (ref 5–15)
BUN: 76 mg/dL — ABNORMAL HIGH (ref 8–23)
CO2: 23 mmol/L (ref 22–32)
Calcium: 8.6 mg/dL — ABNORMAL LOW (ref 8.9–10.3)
Chloride: 97 mmol/L — ABNORMAL LOW (ref 98–111)
Creatinine, Ser: 2.33 mg/dL — ABNORMAL HIGH (ref 0.61–1.24)
GFR calc Af Amer: 32 mL/min — ABNORMAL LOW
GFR calc non Af Amer: 27 mL/min — ABNORMAL LOW
Glucose, Bld: 152 mg/dL — ABNORMAL HIGH (ref 70–99)
Phosphorus: 4.6 mg/dL (ref 2.5–4.6)
Potassium: 4.6 mmol/L (ref 3.5–5.1)
Sodium: 131 mmol/L — ABNORMAL LOW (ref 135–145)

## 2019-08-03 LAB — BRAIN NATRIURETIC PEPTIDE: B Natriuretic Peptide: 168.4 pg/mL — ABNORMAL HIGH (ref 0.0–100.0)

## 2019-08-03 LAB — MAGNESIUM: Magnesium: 2.3 mg/dL (ref 1.7–2.4)

## 2019-08-03 LAB — HEPARIN LEVEL (UNFRACTIONATED): Heparin Unfractionated: 0.64 IU/mL (ref 0.30–0.70)

## 2019-08-03 MED ORDER — WARFARIN SODIUM 2.5 MG PO TABS
12.5000 mg | ORAL_TABLET | Freq: Once | ORAL | Status: AC
  Administered 2019-08-03: 19:00:00 12.5 mg via ORAL
  Filled 2019-08-03: qty 1

## 2019-08-03 MED ORDER — MORPHINE SULFATE (PF) 2 MG/ML IV SOLN
2.0000 mg | INTRAVENOUS | Status: DC | PRN
  Administered 2019-08-03: 06:00:00 4 mg via INTRAVENOUS
  Filled 2019-08-03 (×2): qty 2

## 2019-08-03 MED ORDER — HYDROMORPHONE HCL 2 MG PO TABS
1.0000 mg | ORAL_TABLET | ORAL | Status: DC | PRN
  Administered 2019-08-04 – 2019-08-05 (×6): 1 mg via ORAL
  Filled 2019-08-03 (×6): qty 1

## 2019-08-03 NOTE — Progress Notes (Signed)
PROGRESS NOTE  Joseph Hoffman [id number] DOB: [birth year] DOA: 07/29/2019 PCP: Orpah Melter, MD  Brief History   The patient presented to [facility] with complaints of infection at the site of his recent amputations of toes on the left foot.  Joseph Hoffman is a 68 y.o. male with medical history significant of hypertension, diabetes mellitus type 2, asthma, systolic congestive heart failure with EF of 30 to 35%, coronary artery disease, atrial fibrillation on warfarin, aortic stenosis status post AVR with mechanical valve on Coumadin needing replacement, obstructive sleep apnea on CPAP, chronic kidney disease stage III, depression with anxiety who presented to the hospital today having been sent in by Dr. Ainsley Spinner office.  Went into the vascular office today for follow-up after his right amputation of left great toe by Dr. Carlis Abbott on 07/03/2019.  His wound was noted to be erythematous and swollen with purulent drainage.  He was referred to [facility] hospitalist for direct admission due to his complicated medical history.  Patient will need bridging heparin prior to surgery.  Unfortunately the patient awoke this morning and had some nausea and vomiting with associated diarrhea.  He states that whenever he gets antibiotics he gets diarrhea.  He has not had any diarrhea prior to today.  While I was examining him patient had a momentary episode of fecal urgency which resolved.  Patient is concerned that he will be getting antibiotics and will have diarrhea.  I have ordered stool studies given that he was on vancomycin and Rocephin less than a month ago. He denies chest pain, shortness of breath, cough, fever, sputum production, dysuria, urinary frequency, abdominal pain, palpitations, blurry vision, double vision, unilateral weakness or numbness.  The patient was admitted to a telemetry bed. Vascular surgery is consulted. He was started on IV Vancomycin. And has been continued on his home  medications as possible.  The patient underwent BKA with Dr. Carlis Abbott today. He has tolerated the procedure well, but complains of excruciating pain today.  08/02/2019: Patient seen.  Patient seems a bit drowsy.  Will be careful with mind altering medications.  Patient is on pain medications (Dilaudid) via PCA pump. 08/03/2019: Patient seen.  Patient will is off PCA pump.  Patient is more awake and alert today, though, still mildly confused.  Pain is controlled.  Mild worsening of renal function is noted.  Work-up AKI.  Low threshold to consult nephrology.  Consultants  . Vascular surgery  Procedures  . BKA  Antibiotics   Anti-infectives (From admission, onward)   Start     Dose/Rate Route Frequency Ordered Stop   07/30/19 1800  vancomycin (VANCOCIN) 1,750 mg in sodium chloride 0.9 % 500 mL IVPB  Status:  Discontinued     1,750 mg 250 mL/hr over 120 Minutes Intravenous Every 24 hours 07/29/19 1757 08/01/19 1213   07/29/19 1900  metroNIDAZOLE (FLAGYL) IVPB 500 mg  Status:  Discontinued     500 mg 100 mL/hr over 60 Minutes Intravenous Every 8 hours 07/29/19 1807 08/01/19 1213   07/29/19 1830  cefTRIAXone (ROCEPHIN) 1 g in sodium chloride 0.9 % 100 mL IVPB     1 g 200 mL/hr over 30 Minutes Intravenous Every 24 hours 07/29/19 1803     07/29/19 1800  vancomycin (VANCOCIN) 2,000 mg in sodium chloride 0.9 % 500 mL IVPB     2,000 mg 250 mL/hr over 120 Minutes Intravenous  Once 07/29/19 1757 07/29/19 2255     Subjective  No new complaints.  Pain is controlled.   No fever or chills.   No shortness of breath.    Objective   Vitals:  Vitals:   08/03/19 1240 08/03/19 1440  BP: 134/63 (!) 137/126  Pulse: 81 81  Resp:    Temp:    SpO2: 99% 93%    Exam:  Constitutional:  . The patient is awake and alert, but mildly confused.   Respiratory:  . Clear to auscultation  Cardiovascular:  . S1-S2 Abdomen:  Abdomen is obese, soft and nontender.  Organs are difficult to assess.    Neurologic:  . Drowsy.   . Patient moves all extremities.   Extremities: Patient is status post left BKA.     Today's Data  . Vitals, BMP, CBC  Scheduled Meds: . B-complex with vitamin C  1 tablet Oral Daily  . cholecalciferol  1,000 Units Oral Q breakfast  . digoxin  0.125 mg Oral Daily  . escitalopram  10 mg Oral QHS  . ezetimibe  10 mg Oral Daily  . fenofibrate  160 mg Oral Q breakfast  . gabapentin  300 mg Oral TID  . glimepiride  2 mg Oral Q breakfast  . insulin aspart  0-20 Units Subcutaneous TID WC  . insulin aspart  0-5 Units Subcutaneous QHS  . insulin glargine  30 Units Subcutaneous BID  . metoprolol succinate  25 mg Oral BID  . mometasone-formoterol  2 puff Inhalation BID  . saccharomyces boulardii  250 mg Oral 3 times weekly  . sodium chloride flush  3 mL Intravenous Q12H  . sodium chloride flush  3 mL Intravenous Q12H  . torsemide  40 mg Oral Daily   And  . torsemide  20 mg Oral Daily  . vitamin C  500 mg Oral BID  . warfarin  12.5 mg Oral ONCE-1800  . Warfarin - Pharmacist Dosing Inpatient   Does not apply q1800   Continuous Infusions: . sodium chloride    . sodium chloride 250 mL (08/01/19 0009)  . cefTRIAXone (ROCEPHIN)  IV 1 g (08/02/19 2301)  . heparin 2,650 Units/hr (08/03/19 0222)  . lactated ringers 10 mL/hr at 07/31/19 1750    Principal Problem:   Diabetic ulcer of left foot associated with diabetes mellitus due to underlying condition [facility]) Active Problems:   Permanent atrial fibrillation, since 1994   OSA on CPAP   Chronic anticoagulation   Essential hypertension   CRI (chronic renal insufficiency), stage 3 (moderate) (HCC)   Type II diabetes mellitus with renal manifestations (HCC)   CKD (chronic kidney disease), stage III (HCC)   H/O heart valve replacement with mechanical valve   Foot ulcer, left (HCC)   CAD, multiple vessel   PAD (peripheral artery disease) (HCC)   Nausea & vomiting   Diarrhea   Left ventricular ejection fraction  of 30% to 35%   LOS: 5 days   A & P   Foot ulcer of left foot associated with diabetes mellitus due to underlying peripheral artery disease: Patient did not heal well postoperatively.  He has underlying peripheral vascular disease and now is having pus draining from the wound.  His foot is painful and Dr. Carlis Abbott has determined that he will require a below the knee amputation.  I am starting him on vancomycin and Ceftriaxone  to treat any infection prior to surgery. Pt underwent surgery for BKA today. The patient has tolerated the procedure well ,but today is having pain that is not relieved by morphine and oxycodone. I have  added neurontin, but will leave titration of narcotic pain relief to vascular surgery. 08/02/2019: Patient is status left BKA.  Will optimize pain medication to avoid excessive sedation. 08/03/2019: Pain is controlled.  Vascular surgery input is appreciated.  History of heart valve replacement with mechanical aortic valve on chronic anticoagulation also with permanent atrial fibrillation: Warfarin will be discontinued we will start him on a heparin drip to bridge him until surgery.  Postoperatively he will restart Coumadin.  He will likely need bridgeing for his warfarin postoperatively. Heparin infusion has been continued. 08/02/2019: We will start warfarin. 08/03/2019: INR today is 1.3  Systolic congestive heart failure with left ventricular ejection fraction of 30 to 35%: Noted. Fluid balance negative by 420cc since admission. 08/03/2019: Continue to monitor closely.  Acute kidney injury versus acute kidney injury on chronic kidney disease stage II/III: 08/03/2019: Work-up AKI.  Avoid nephrotoxic medications.  Dose all medications assuming GFR of less than 10 mils per minute.  Obstructive sleep apnea on CPAP: Continue home settings.   Essential hypertension: Controlled.      DVT prophylaxis: IV heparin.  Warfarin bridge. Code Status: Full code Family Communication:  None at bedside. Disposition Plan:  This will depend on hospital course.  Dana Allan MD  [facility] Direct contact: see [email or web address]  7PM-7AM contact night coverage as above

## 2019-08-03 NOTE — Progress Notes (Signed)
Pt stated that he would call when ready to be placed on CPAP for the night. Resting comfortably. RT to cont to monitor.

## 2019-08-03 NOTE — Care Management Important Message (Signed)
Important Message  Patient Details  Name: MARYLAND SCHWINDT MRN: [id number] Date of Birth: 1951/04/03   Medicare Important Message Given:  Yes     [name] 08/03/2019, 1:47 PM

## 2019-08-03 NOTE — Progress Notes (Signed)
ANTICOAGULATION CONSULT NOTE - Follow Up Consult  Pharmacy Consult for Heparin and Warfarin Indication: atrial fibrillation and mechanical AVR  Allergies  Allergen Reactions  . Iohexol Anaphylaxis  . Niacin And Related     Flushing with immediate realese  . Penicillins Other (See Comments)    Unknown.[name]  Did it involve swelling of the face/tongue/throat, SOB, or low BP? Unknown Did it involve sudden or severe rash/hives, skin peeling, or any reaction on the inside of your mouth or nose? Unknown Did you need to seek medical attention at a hospital or doctor's office? Unknown When did it last happen?Childhood If all above answers are "NO", may proceed with cephalosporin use.    Patient Measurements: Height: 5\' 11"  (180.3 cm) Weight: 247 lb 6.4 oz (112.2 kg) IBW/kg (Calculated) : 75.3 Heparin Dosing Weight: 99.2 kg  Vital Signs: Temp: 97.6 F (36.4 C) (08/31 1108) Temp Source: Oral (08/31 1108) BP: 93/64 (08/31 1108) Pulse Rate: 79 (08/31 1108)  Labs: Recent Labs    08/01/19 0338  08/02/19 0442 08/02/19 1456 08/03/19 0729  HGB 9.5*  --  9.7*  --  9.6*  HCT 29.9*  --  30.4*  --  30.2*  PLT 210  --  225  --  225  LABPROT  --   --   --   --  15.7*  INR  --   --   --   --  1.3*  HEPARINUNFRC  --    < > 0.51 0.46 0.64  CREATININE 1.83*  --  2.21*  --  2.33*   < > = values in this interval not displayed.    Estimated Creatinine Clearance: 37.6 mL/min (A) (by C-G formula based on SCr of 2.33 mg/dL (H)).  Assessment: 68 yo M on warfarin PTA for mechanical AVR + afib.  Warfarin was held for BKA and pt was initiated on heparin bridge.  Patient is now s/p left BKA.   -heparin level at goal, CBC stable  -INR 1.3 (s/p 5mg  IV Vit K 8/27)   Warfarin dose PTA = 10 mg daily except 12.5mg  TTS  Goal of Therapy:  INR 2.5-3.5 Heparin level 0.3-0.7 units/ml Monitor platelets by anticoagulation protocol: Yes   Plan:  - Continue heparin infusion at  2650 units/hr - Coumadin 12.5mg  PO x 1 tonight - Daily INR, heparin level, and CBC   Hildred Laser, PharmD Clinical Pharmacist **Pharmacist phone directory can now be found on [email or web address] ([email or web address]).  Listed under [place].

## 2019-08-03 NOTE — Progress Notes (Signed)
[facility] [place]  Subjective  - Pain slowly improving through weekend.   Objective (!) 117/47 74 98.6 F (37 C) (Oral) 16 98%  Intake/Output Summary (Last 24 hours) at 08/03/2019 0748 Last data filed at 08/03/2019 0330 Gross per 24 hour  Intake 1865.41 ml  Output 1600 ml  Net 265.41 ml    L BKA c/d/i  Laboratory Lab Results: Recent Labs    08/01/19 0338 08/02/19 0442  WBC 8.6 10.9*  HGB 9.5* 9.7*  HCT 29.9* 30.4*  PLT 210 225   BMET Recent Labs    08/01/19 0338 08/02/19 0442  NA 132* 131*  K 4.3 4.4  CL 98 98  CO2 24 22  GLUCOSE 310* 217*  BUN 55* 65*  CREATININE 1.83* 2.21*  CALCIUM 8.5* 8.5*    COAG Lab Results  Component Value Date   INR 1.4 (H) 07/31/2019   INR 2.5 (H) 07/30/2019   INR 2.4 (H) 07/29/2019   No results found for: PTT  Assessment/Planning: POD #3 s/p L BKA.  Dressing changed this am and incision looks great.  Engage PT/OT etc.  Vascular will continue to follow.  Discussed staples typically stay 3+ weeks.  Marty Heck 08/03/2019 7:48 AM --

## 2019-08-03 NOTE — TOC Initial Note (Addendum)
Transition of Care [facility]) - Initial/Assessment Note    Patient Details  Name: Joseph Hoffman MRN: [id number] Date of Birth: 20-May-1951  Transition of Care [facility]) CM/SW Contact:    Sherrilyn Rist Advanced Care Supervisor Phone Number: [phone] 08/03/2019, 12:15 PM  Clinical Narrative:                 Patient lives at home with spouse; PCP: Orpah Melter, MD; has private insurance with Medicare / Christella Scheuermann with prescription drug coverage; patient is active with [facility] as prior to admission for HHRN/ PT; DME rolling walker at home; patient is requesting a wheelchair at discharge; he is also active with [facility]; CM will continue to follow for progression of care.  Expected Discharge Plan: [place] Barriers to Discharge: No Barriers Identified   Patient Goals and CMS Choice Patient states their goals for this hospitalization and ongoing recovery are:: to get stronger CMS Medicare.gov Compare Post Acute Care list provided to:: Patient Choice offered to / list presented to : Patient  Expected Discharge Plan and Services Expected Discharge Plan: [place] In-house Referral: NA Discharge Planning Services: CM Consult Post Acute Care Choice: Resumption of Svcs/PTA Provider Living arrangements for the past 2 months: Single Family Home                           HH Arranged: RN, PT [facility] Agency: [place] Date [facility] Agency Contacted: 08/03/19 Time [place]: 1214 Representative spoke with at [place]: Tommi Rumps  Prior Living Arrangements/Services Living arrangements for the past 2 months: [place] with:: Spouse Patient language and need for interpreter reviewed:: Yes Do you feel safe going back to the place where you live?: Yes      Need for Family Participation in Patient Care: Yes (Comment)(sopportive spouse "the [employer]")   Current home services: Home PT, Home RN Criminal Activity/Legal Involvement  Pertinent to Current Situation/Hospitalization: No - Comment as needed  Activities of Daily Living      Permission Sought/Granted Permission sought to share information with : Case Manager Permission granted to share information with : Yes, Verbal Permission Granted  Share Information with NAME: [place]  Permission granted to share info w AGENCY: [facility]        Emotional Assessment Appearance:: Developmentally appropriate Attitude/Demeanor/Rapport: Engaged Affect (typically observed): Accepting Orientation: : Oriented to Self, Oriented to Place, Oriented to  Time, Oriented to Situation Alcohol / Substance Use: Not Applicable Psych Involvement: No (comment)  Admission diagnosis:  NON-HEALING SURGICAL WOUND NON-VIABLE TISSUE LEFT LOWER EXTREMITY Patient Active Problem List   Diagnosis Date Noted  . Nausea & vomiting 07/29/2019  . Diarrhea 07/29/2019  . Diabetic ulcer of left foot associated with diabetes mellitus due to underlying condition ([place]) 07/29/2019  . Left ventricular ejection fraction of 30% to 35% 07/29/2019  . PAD (peripheral artery disease) ([place]) 07/28/2019  . Long term (current) use of anticoagulants 07/13/2019  . CAD, multiple vessel 07/10/2019  . Aortic valve regurgitation   . Aortic valve disease   . Foot ulcer, left ([place]) 06/26/2019  . Depression 05/28/2019  . Congestive heart failure (CHF) ([place]) 05/13/2019  . H/O heart valve replacement with mechanical valve   . Type II diabetes mellitus with renal manifestations ([place]) 05/12/2019  . Chest pain 05/12/2019  . Elevated troponin 05/12/2019  . CKD (chronic kidney disease), stage III ([place]) 05/12/2019  . Acute on  chronic systolic (congestive) heart failure ([place]) 05/12/2019  . CRI (chronic renal insufficiency), stage 3 (moderate) ([place]) 04/09/2019  . Acute combined systolic and diastolic heart failure ([place]) 04/09/2019  . Essential hypertension 06/03/2017  . Dyslipidemia, goal LDL below 70 04/11/2017  . AS  (aortic stenosis) 10/28/2012  . Acute on chronic systolic heart failure, re-admitted 10/12/12 10/12/2012  . S/P AVR, 09/29/12, [facility]. (discharged 10/05/12) 09/30/2012  . Permanent atrial fibrillation, since 1994 09/30/2012  . Type 2 IDDM 09/30/2012  . OSA on CPAP 09/30/2012  . Normal coronary arteries at cath Oct 2013 09/30/2012  . Chronic anticoagulation 09/30/2012  . Severe aortic stenosis 09/25/2012   PCP:  Orpah Melter, MD Pharmacy:   [facility] [address] [place] [address] Phone: [phone] Fax: [place] [facility] [place], [place] [address] Phone: [phone] Fax: [phone]  [facility] [place] [place] - [address] [place] [address] Phone: [phone] Fax: [phone]     Social Determinants of Health (SDOH) Interventions    Readmission Risk Interventions No flowsheet data found.

## 2019-08-03 NOTE — Progress Notes (Signed)
Patient stating having the urge to urinate but unable to start stream. Patient also stated feeling pressure on bladder. Bladder scanned, got 924ml. Paged [facility] got orders for an in &out catheter. Output 1600 ml. Patient stating pressure had gone away and feels way better. Will continue to monitor patient.

## 2019-08-03 NOTE — Progress Notes (Signed)
Patient on PCA pump harder to arouse, needing physical stimulation. Patient alert & oriented x3, having increased confusion. Patient stating seeing a fire and needing to get paid. POSS score of 3. Patient 3 days s/p LBKA. Paged [place] about discontinuing PCA pump, got orders to discontinue and PRN morphine.   Stopped PCA pump, wasted 60ml IV dilaudid witnessed by Jamal Collin, RN. Will continue to monitor patient closely.

## 2019-08-04 LAB — PROTIME-INR
INR: 1.3 — ABNORMAL HIGH (ref 0.8–1.2)
Prothrombin Time: 16 seconds — ABNORMAL HIGH (ref 11.4–15.2)

## 2019-08-04 LAB — GLUCOSE, CAPILLARY
Glucose-Capillary: 129 mg/dL — ABNORMAL HIGH (ref 70–99)
Glucose-Capillary: 252 mg/dL — ABNORMAL HIGH (ref 70–99)
Glucose-Capillary: 259 mg/dL — ABNORMAL HIGH (ref 70–99)
Glucose-Capillary: 237 mg/dL — ABNORMAL HIGH (ref 70–99)

## 2019-08-04 LAB — CULTURE, BLOOD (ROUTINE X 2)
Culture: NO GROWTH
Culture: NO GROWTH
Special Requests: ADEQUATE
Special Requests: ADEQUATE

## 2019-08-04 LAB — HEPARIN LEVEL (UNFRACTIONATED): Heparin Unfractionated: 0.63 IU/mL (ref 0.30–0.70)

## 2019-08-04 LAB — SODIUM, URINE, RANDOM: Sodium, Ur: 40 mmol/L

## 2019-08-04 MED ORDER — TAMSULOSIN HCL 0.4 MG PO CAPS
0.4000 mg | ORAL_CAPSULE | Freq: Every day | ORAL | Status: DC
  Administered 2019-08-04: 0.4 mg via ORAL
  Filled 2019-08-04: qty 1

## 2019-08-04 MED ORDER — WARFARIN SODIUM 7.5 MG PO TABS
15.0000 mg | ORAL_TABLET | Freq: Once | ORAL | Status: AC
  Administered 2019-08-04: 15 mg via ORAL
  Filled 2019-08-04: qty 2

## 2019-08-04 NOTE — Progress Notes (Signed)
ANTICOAGULATION CONSULT NOTE - Follow Up Consult  Pharmacy Consult for Heparin and Warfarin Indication: atrial fibrillation and mechanical AVR  Allergies  Allergen Reactions  . Iohexol Anaphylaxis  . Niacin And Related     Flushing with immediate realese  . Penicillins Other (See Comments)    Unknown.[name]  Did it involve swelling of the face/tongue/throat, SOB, or low BP? Unknown Did it involve sudden or severe rash/hives, skin peeling, or any reaction on the inside of your mouth or nose? Unknown Did you need to seek medical attention at a hospital or doctor's office? Unknown When did it last happen?Childhood If all above answers are "NO", may proceed with cephalosporin use.    Patient Measurements: Height: 5\' 11"  (180.3 cm) Weight: 244 lb 4.3 oz (110.8 kg) IBW/kg (Calculated) : 75.3 Heparin Dosing Weight: 99.2 kg  Vital Signs: Temp: 98.9 F (37.2 C) (09/01 0013) Temp Source: Axillary (09/01 0013) BP: 121/55 (09/01 0754) Pulse Rate: 65 (09/01 0754)  Labs: Recent Labs    08/02/19 0442 08/02/19 1456 08/03/19 0729 08/04/19 0410  HGB 9.7*  --  9.6*  --   HCT 30.4*  --  30.2*  --   PLT 225  --  225  --   LABPROT  --   --  15.7* 16.0*  INR  --   --  1.3* 1.3*  HEPARINUNFRC 0.51 0.46 0.64 0.63  CREATININE 2.21*  --  2.33*  --     Estimated Creatinine Clearance: 37.3 mL/min (A) (by C-G formula based on SCr of 2.33 mg/dL (H)).  Assessment: 68 yo M on warfarin PTA for mechanical AVR + afib.  Warfarin was held for BKA and pt was initiated on heparin bridge.  Patient is now s/p left BKA.   -heparin level at goal, CBC stable  -INR still at 1.3 (s/p 5mg  IV Vit K 8/27)   Warfarin dose PTA = 10 mg daily except 12.5mg  TTS  Goal of Therapy:  INR 2.5-3.5 Heparin level 0.3-0.7 units/ml Monitor platelets by anticoagulation protocol: Yes   Plan:  - Continue heparin infusion at 2650 units/hr - Coumadin 15 mg po x 1 tonight - Daily INR, heparin  level, and CBC   Anette Guarneri, PharmD Clinical Pharmacist **Pharmacist phone directory can now be found on [email or web address] ([email or web address]).  Listed under [place].

## 2019-08-04 NOTE — Progress Notes (Signed)
Pt self administered CPAP. 

## 2019-08-04 NOTE — Progress Notes (Signed)
Inpatient Diabetes Program Recommendations  AACE/ADA: New Consensus Statement on Inpatient Glycemic Control (2015)  Target Ranges:  Prepandial:   less than 140 mg/dL      Peak postprandial:   less than 180 mg/dL (1-2 hours)      Critically ill patients:  140 - 180 mg/dL   Lab Results  Component Value Date   GLUCAP 129 (H) 08/04/2019   HGBA1C 8.7 (H) 05/13/2019    Review of Glycemic Control Results for Joseph Hoffman, Joseph Hoffman (MRN [id number]) as of 08/04/2019 11:41  Ref. Range 08/03/2019 07:35 08/03/2019 11:06 08/03/2019 17:30 08/03/2019 21:29 08/04/2019 07:54  Glucose-Capillary Latest Ref Range: 70 - 99 mg/dL 148 (H) 300 (H) 284 (H) 276 (H) 129 (H)   Inpatient Diabetes Program Recommendations:   Postprandial CBGs elevated. -Add Novolog 4 units tid meal coverage if eats 50%  Thank you, Bethena Roys E. Shaaron Golliday, RN, MSN, CDE  Diabetes Coordinator Inpatient Glycemic Control Team Team Pager [phone] (8am-5pm) 08/04/2019 11:41 AM

## 2019-08-04 NOTE — Evaluation (Addendum)
Physical Therapy Evaluation Patient Details Name: Joseph Hoffman MRN: [id number] DOB: 1951-11-26 Today's Date: 08/04/2019   History of Present Illness  68 y.o. male with medical history significant of hypertension, diabetes mellitus type 2, asthma, systolic congestive heart failure with EF of 30 to 35%, coronary artery disease, atrial fibrillation on warfarin, aortic stenosis status post AVR with mechanical valve on Coumadin needing replacement, obstructive sleep apnea on CPAP, chronic kidney disease stage III, depression with anxiety. Presents to hospital from vascular office for follow up of L great toe amputation 07/03/19. Admitted for treatment of infection of surgical site. S/p L BKA 07/31/19.  Clinical Impression  PTA pt living with wife in 2 story home with 3 steps to enter, since L great toe amputation 07/03/19 pt was utilizing RW for ambulation and living on the 1st floor of his home. Pt is limited in today's session by burning pain in his residual limb. Pt educated on desensitizing strategies to aid in pain reduction. Pt is currently max Ax2 for bed moblity and transfers. Pt is very motivated to return to independence and is hopeful for eventual prosthetic placement. Pt also has good home support as his wife is a retired Education officer, environmental and is available 24 hrs/day at discharge. These factors make this pt an excellent candidate for CIR level rehab at discharge. PT will continue to follow acutely.    Follow Up Recommendations CIR    Equipment Recommendations  Other (comment)(TBD at next venue)       Precautions / Restrictions Precautions Precautions: Fall Precaution Comments: monitor HR and breathing  Restrictions Weight Bearing Restrictions: Yes LLE Weight Bearing: Non weight bearing      Mobility  Bed Mobility Overal bed mobility: Needs Assistance Bed Mobility: Supine to Sit     Supine to sit: Max assist;+2 for physical assistance;HOB elevated     General bed mobility comments: assist  to progress BLE off EOB and progress trunk to upright position;  Transfers Overall transfer level: Needs assistance Equipment used: Rolling walker (2 wheeled) Transfers: Sit to/from Stand;Lateral/Scoot Transfers Sit to Stand: Max assist;+2 physical assistance        Lateral/Scoot Transfers: Max assist;+2 physical assistance General transfer comment: maxA+2 to progress to upright standing;cues to extend through RLE, pelvic control and progress trunk upright, able to tolerate standing for about 10seconds;maxA+2 to lateral scoot to recliner towards R side      Balance Overall balance assessment: Needs assistance Sitting-balance support: Bilateral upper extremity supported;Feet supported Sitting balance-Leahy Scale: Poor Sitting balance - Comments: reliant on BUE support initially with intermittent no UE support;LOB posteriorly x2 with physical assist from therapist to stabilize;   Standing balance support: Bilateral upper extremity supported Standing balance-Leahy Scale: Zero Standing balance comment: maxA+2 for support in standing able to tolerate standing for 10seconds, attempt to stand 2x                             Pertinent Vitals/Pain Pain Assessment: Faces Faces Pain Scale: Hurts worst Pain Location: L leg Pain Descriptors / Indicators: Aching;Burning;Grimacing;Guarding Pain Intervention(s): Limited activity within patient's tolerance    Home Living Family/patient expects to be discharged to:: Private residence Living Arrangements: Spouse/significant other;Children Available Help at Discharge: Family Type of Home: Apartment Home Access: Stairs to enter   Technical brewer of Steps: 3 Home Layout: Two level;Able to live on main level with bedroom/bathroom        Prior Function Level of Independence: Independent  Comments: pt used to be a Academic librarian, independent with all ADL and no AD     Hand Dominance   Dominant Hand: Right     Extremity/Trunk Assessment   Upper Extremity Assessment Upper Extremity Assessment: Overall WFL for tasks assessed    Lower Extremity Assessment Lower Extremity Assessment: LLE deficits/detail LLE Deficits / Details: s/p BKA;limited knee extension;educated pt on mirroring movement with RLE;educated pt on importance of neutralizing hip and extending knee in preparation for prosthetic use in the future;educated pt on desensitization strategies (looking at limb, touching leg) LLE: Unable to fully assess due to pain LLE Sensation: decreased light touch(burning)    Cervical / Trunk Assessment Cervical / Trunk Assessment: Normal  Communication   Communication: No difficulties  Cognition Arousal/Alertness: Awake/alert Behavior During Therapy: WFL for tasks assessed/performed Overall Cognitive Status: Within Functional Limits for tasks assessed                                        General Comments General comments (skin integrity, edema, etc.): HR 70s to max of 86bpm with standing and transfers; O2 >95% RA throughout;minor blood noted through dressing, RN aware        Assessment/Plan    PT Assessment Patient needs continued PT services  PT Problem List Decreased strength;Decreased activity tolerance;Decreased range of motion;Decreased balance;Decreased mobility;Decreased coordination;Pain;Impaired sensation       PT Treatment Interventions DME instruction;Gait training;Functional mobility training;Therapeutic activities;Therapeutic exercise;Stair training;Balance training;Cognitive remediation;Patient/family education    PT Goals (Current goals can be found in the Care Plan section)  Acute Rehab PT Goals Patient Stated Goal: to get stronger and be independent PT Goal Formulation: With patient Time For Goal Achievement: 08/18/19 Potential to Achieve Goals: Good    Frequency Min 3X/week        Co-evaluation PT/OT/SLP Co-Evaluation/Treatment: Yes Reason for  Co-Treatment: Complexity of the patient's impairments (multi-system involvement) PT goals addressed during session: Mobility/safety with mobility;Balance OT goals addressed during session: ADL's and self-care       AM-PAC PT "6 Clicks" Mobility  Outcome Measure Help needed turning from your back to your side while in a flat bed without using bedrails?: A Lot Help needed moving from lying on your back to sitting on the side of a flat bed without using bedrails?: Total Help needed moving to and from a bed to a chair (including a wheelchair)?: Total Help needed standing up from a chair using your arms (e.g., wheelchair or bedside chair)?: Total Help needed to walk in hospital room?: Total Help needed climbing 3-5 steps with a railing? : Total 6 Click Score: 7    End of Session Equipment Utilized During Treatment: Gait belt;Oxygen Activity Tolerance: Patient limited by pain Patient left: in chair;with call bell/phone within reach;with chair alarm set Nurse Communication: Mobility status PT Visit Diagnosis: Unsteadiness on feet (R26.81);Other abnormalities of gait and mobility (R26.89);Muscle weakness (generalized) (M62.81);Difficulty in walking, not elsewhere classified (R26.2);Pain Pain - Right/Left: Left Pain - part of body: Leg    Time: 1015-1059 PT Time Calculation (min) (ACUTE ONLY): 44 min   Charges:   PT Evaluation $PT Eval Moderate Complexity: 1 Mod          Angel Hobdy B. Migdalia Dk PT, DPT Acute Rehabilitation Services Pager [phone] Office [phone]   [place] 08/04/2019, 1:37 PM

## 2019-08-04 NOTE — Progress Notes (Signed)
[facility] [place]  Subjective  - Some pain in the left LE stump and weakness.   Objective (!) 121/55 65 98.9 F (37.2 C) (Axillary) 18 98%  Intake/Output Summary (Last 24 hours) at 08/04/2019 1125 Last data filed at 08/04/2019 0650 Gross per 24 hour  Intake 1444.61 ml  Output 6925 ml  Net -5480.39 ml    Left Stump ecchymosis posterior stump with anterior middle staple line blisters.  SS drainage on old dressing.  New dressing applied. Stump is warm to touch.  Assessment/Planning: POD # 4 Left BKA  Currently stable disposition with warm stump and intact staples.  Joseph Hoffman 08/04/2019 11:25 AM --  Laboratory Lab Results: Recent Labs    08/02/19 0442 08/03/19 0729  WBC 10.9* 10.9*  HGB 9.7* 9.6*  HCT 30.4* 30.2*  PLT 225 225   BMET Recent Labs    08/02/19 0442 08/03/19 0729  NA 131* 131*  K 4.4 4.6  CL 98 97*  CO2 22 23  GLUCOSE 217* 152*  BUN 65* 76*  CREATININE 2.21* 2.33*  CALCIUM 8.5* 8.6*    COAG Lab Results  Component Value Date   INR 1.3 (H) 08/04/2019   INR 1.3 (H) 08/03/2019   INR 1.4 (H) 07/31/2019   No results found for: PTT

## 2019-08-04 NOTE — Progress Notes (Signed)
Rehab Admissions Coordinator Note:  Patient was screened by Cleatrice Burke for appropriateness for an Inpatient Acute Rehab Consult per PT and OT recs.   At this time, we are recommending Inpatient Rehab consult if pt would like to be considered for admit. Please advise.Cleatrice Burke RN MSN 08/04/2019, 1:49 PM  I can be reached at [phone].

## 2019-08-04 NOTE — Progress Notes (Signed)
Foley inserted with Alleen Borne, RN.

## 2019-08-04 NOTE — Progress Notes (Signed)
Patient placed on nasal CPAP for HS using 2L oxygen and auto titration mode (min4, max 20).  Patient tolerated well.

## 2019-08-04 NOTE — Progress Notes (Signed)
PROGRESS NOTE  Joseph Hoffman [id number] DOB: May 01, 1951 DOA: 07/29/2019 PCP: Orpah Melter, MD  Brief History   The patient presented to [facility] with complaints of infection at the site of his recent amputations of toes on the left foot.  Joseph Hoffman is a 68 y.o. male with medical history significant of hypertension, diabetes mellitus type 2, asthma, systolic congestive heart failure with EF of 30 to 35%, coronary artery disease, atrial fibrillation on warfarin, aortic stenosis status post AVR with mechanical valve on Coumadin needing replacement, obstructive sleep apnea on CPAP, chronic kidney disease stage III, depression with anxiety who presented to the hospital today having been sent in by Dr. Ainsley Spinner office.  Went into the vascular office today for follow-up after his right amputation of left great toe by Dr. Carlis Abbott on 07/03/2019.  His wound was noted to be erythematous and swollen with purulent drainage.  He was referred to [facility] for direct admission due to his complicated medical history.  Patient will need bridging heparin prior to surgery.  Unfortunately the patient awoke this morning and had some nausea and vomiting with associated diarrhea.  He states that whenever he gets antibiotics he gets diarrhea.  He has not had any diarrhea prior to today.  While I was examining him patient had a momentary episode of fecal urgency which resolved.  Patient is concerned that he will be getting antibiotics and will have diarrhea.  I have ordered stool studies given that he was on vancomycin and Rocephin less than a month ago. He denies chest pain, shortness of breath, cough, fever, sputum production, dysuria, urinary frequency, abdominal pain, palpitations, blurry vision, double vision, unilateral weakness or numbness.  The patient was admitted to a telemetry bed. Vascular surgery is consulted. He was started on IV Vancomycin. And has been continued on his home  medications as possible.  The patient underwent BKA with Dr. Carlis Abbott today. He has tolerated the procedure well, but complains of excruciating pain today.  The patient was transiently on Dilaudid PCA pump, but easily became too sleepy. It was discontinued on 08/03/2019.   Consultants  . Vascular surgery  Procedures  . BKA  Antibiotics   Anti-infectives (From admission, onward)   Start     Dose/Rate Route Frequency Ordered Stop   07/30/19 1800  vancomycin (VANCOCIN) 1,750 mg in sodium chloride 0.9 % 500 mL IVPB  Status:  Discontinued     1,750 mg 250 mL/hr over 120 Minutes Intravenous Every 24 hours 07/29/19 1757 08/01/19 1213   07/29/19 1900  metroNIDAZOLE (FLAGYL) IVPB 500 mg  Status:  Discontinued     500 mg 100 mL/hr over 60 Minutes Intravenous Every 8 hours 07/29/19 1807 08/01/19 1213   07/29/19 1830  cefTRIAXone (ROCEPHIN) 1 g in sodium chloride 0.9 % 100 mL IVPB     1 g 200 mL/hr over 30 Minutes Intravenous Every 24 hours 07/29/19 1803 08/05/19 2359   07/29/19 1800  vancomycin (VANCOCIN) 2,000 mg in sodium chloride 0.9 % 500 mL IVPB     2,000 mg 250 mL/hr over 120 Minutes Intravenous  Once 07/29/19 1757 07/29/19 2255     Subjective  The patient is resting comfortably. Complaining of excruciating pain at the site of the left amputation.  Objective   Vitals:  Vitals:   08/04/19 1157 08/04/19 1652  BP: 121/62 (!) 131/52  Pulse: 72 60  Resp: 15 15  Temp: 98.5 F (36.9 C) 98.6 F (37 C)  SpO2: 96% 96%  Exam:  Constitutional:  . The patient is awake, alert, and oriented x 3. Moderate distress from pain. Respiratory:  . No increased work of breathing.  . No wheezes, rales, or rhonchi.  . No tactile fremitus Cardiovascular:  . Regular rate and rhythm. . No murmurs, ectopy, or gallups. . No lateral PMI. No thrills. Abdomen:  . Abdomen is soft, non-tender, non-distended. . No hernias, masses, or organomegaly . Normoactive bowel sounds Musculoskeletal:  No  cyanosis, clubbing or edema of right lower extremity and foot. Left stump in bandaged. Skin:  . No rashes, lesions, ulcers . palpation of skin: no induration or nodules Neurologic:  . CN 2-12 intact . Sensation all 4 extremities intact Psychiatric:  . Mental status o Mood, affect appropriate o Orientation to person, place, time  . judgment and insight appear intact     I have personally reviewed the following:   Today's Data  . Vitals, BMP, CBC  Scheduled Meds: . B-complex with vitamin C  1 tablet Oral Daily  . cholecalciferol  1,000 Units Oral Q breakfast  . digoxin  0.125 mg Oral Daily  . escitalopram  10 mg Oral QHS  . ezetimibe  10 mg Oral Daily  . fenofibrate  160 mg Oral Q breakfast  . gabapentin  300 mg Oral TID  . glimepiride  2 mg Oral Q breakfast  . insulin aspart  0-20 Units Subcutaneous TID WC  . insulin aspart  0-5 Units Subcutaneous QHS  . insulin glargine  30 Units Subcutaneous BID  . metoprolol succinate  25 mg Oral BID  . mometasone-formoterol  2 puff Inhalation BID  . saccharomyces boulardii  250 mg Oral 3 times weekly  . sodium chloride flush  3 mL Intravenous Q12H  . sodium chloride flush  3 mL Intravenous Q12H  . tamsulosin  0.4 mg Oral QPC supper  . torsemide  40 mg Oral Daily   And  . torsemide  20 mg Oral Daily  . vitamin C  500 mg Oral BID  . Warfarin - Pharmacist Dosing Inpatient   Does not apply q1800   Continuous Infusions: . sodium chloride    . sodium chloride 250 mL (08/01/19 0009)  . cefTRIAXone (ROCEPHIN)  IV 1 g (08/03/19 2114)  . heparin 2,650 Units/hr (08/04/19 1121)  . lactated ringers 10 mL/hr at 07/31/19 1750    Principal Problem:   Diabetic ulcer of left foot associated with diabetes mellitus due to underlying condition [facility]) Active Problems:   Permanent atrial fibrillation, since 1994   OSA on CPAP   Chronic anticoagulation   Essential hypertension   CRI (chronic renal insufficiency), stage 3 (moderate) (HCC)   Type  II diabetes mellitus with renal manifestations (HCC)   CKD (chronic kidney disease), stage III (HCC)   H/O heart valve replacement with mechanical valve   Foot ulcer, left (HCC)   CAD, multiple vessel   PAD (peripheral artery disease) (HCC)   Nausea & vomiting   Diarrhea   Left ventricular ejection fraction of 30% to 35%   LOS: 6 days   A & P   Foot ulcer of left foot associated with diabetes mellitus due to underlying peripheral artery disease: Patient did not heal well postoperatively.  He has underlying peripheral vascular disease and now is having pus draining from the wound.  His foot is painful and Dr. Carlis Abbott has determined that he will require a below the knee amputation.  I am starting him on vancomycin and Ceftriaxone  to treat  any infection prior to surgery. Pt underwent surgery for BKA today. The patient has tolerated the procedure well ,but today is having pain that is not relieved by morphine and oxycodone. I have added neurontin, but will leave titration of narcotic pain relief to vascular surgery. The patient will be evaluated for inpatient rehab.  History of heart valve replacement with mechanical aortic valve on chronic anticoagulation also with permanent atrial fibrillation: Warfarin will be discontinued we will start him on a heparin drip to bridge him until surgery.  Postoperatively he will restart Coumadin.  He will likely need bridgeing for his warfarin postoperatively. Heparin infusion has been continued.  Nausea vomiting and diarrhea: Resolved per patient.  I am concerned that he may have a viral gastroenteritis will check stool studies also check stool for C. difficile as he did receive vancomycin and ceftriaxone last hospitalization less than a month ago. However, the tests have not been performed, because the patient has not produced any stool.  Patient already takes a probiotic.  Would recommend that he try adding yogurt to that. Monitor.  Systolic congestive heart  failure with left ventricular ejection fraction of 30 to 35%: Noted. Fluid balance negative by [id number] since admission.  Chronic renal insufficiency stage III: Noted avoid nephrotoxic agents or hypotension. Creatinine is elevated at 2.33. Monitor creatinine electrolyes, and volume status.  Coronary artery disease of multiple vessels: Patient not only needs an aortic valve replacement but he tells me he is in need of a two-vessel bypass when his valve is replaced.  Plan is to treat his underlying foot infection first and then proceed with valve replacement and CABG at a later date.  Obstructive sleep apnea on CPAP: We will ask patient to bring in his machine and use home settings.  Essential hypertension: Noted blood pressure management as per home medications.  I have seen and examined this patient myself. I have spent 35 minutes in his evaluation and care.  DVT prophylaxis: IV heparin Code Status: Full code Family Communication: None at bedside. Disposition Plan: Inpatient rehab  Charlean Carneal, DO [facility] Direct contact: see [email or web address]  7PM-7AM contact night coverage as above 08/04/2019, 3:15 PM  LOS: 1 day

## 2019-08-04 NOTE — Progress Notes (Signed)
Occupational Therapy Evaluation Patient Details Name: Joseph Hoffman MRN: [id number] DOB: 1951/11/18 Today's Date: 08/04/2019    History of Present Illness 68 y.o. male with medical history significant of hypertension, diabetes mellitus type 2, asthma, systolic congestive heart failure with EF of 30 to 35%, coronary artery disease, atrial fibrillation on warfarin, aortic stenosis status post AVR with mechanical valve on Coumadin needing replacement, obstructive sleep apnea on CPAP, chronic kidney disease stage III, depression with anxiety. Presents to hospital from vascular office for follow up of L great toe amputation 07/03/19. Admitted for treatment of infection of surgical site. S/p L BKA 07/31/19.   Clinical Impression   PTA, pt was living at home with his wife, and was independent with ADL/IADL and functional mobility without AD. 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 Pt currently requires maxA+2 for bed mobility, maxA+2 for LB dressing, and maxA+2 for sit<>stand with RW and lateral scoot transfer from EOB to recliner towards his R side. Educated pt on phantom limb sensation, desensitization, and importance of hip and knee ROM in anticipation for future prosthesis. Due to decline in current level of function, pt would benefit from acute OT to address established goals to facilitate safe D/C to venue listed below. Pt demonstrates great motivation to participate with therapy, was independent at baseline, and has a good support system at home. Pt would benefit greatly from CIR therapies prior to returning home. Will continue to follow acutely.     Follow Up Recommendations  CIR    Equipment Recommendations  3 in 1 bedside commode    Recommendations for Other Services       Precautions / Restrictions Precautions Precautions: Fall Precaution Comments: monitor HR and breathing  Restrictions Weight Bearing Restrictions: Yes LLE  Weight Bearing: Non weight bearing      Mobility Bed Mobility Overal bed mobility: Needs Assistance Bed Mobility: Supine to Sit     Supine to sit: Max assist;+2 for physical assistance;HOB elevated     General bed mobility comments: assist to progress BLE off EOB and progress trunk to upright position;  Transfers Overall transfer level: Needs assistance Equipment used: Rolling walker (2 wheeled) Transfers: Sit to/from Stand;Lateral/Scoot Transfers Sit to Stand: Max assist;+2 physical assistance        Lateral/Scoot Transfers: Max assist;+2 physical assistance General transfer comment: maxA+2 to progress to upright standing;cues to extend through RLE, pelvic control and progress trunk upright, able to tolerate standing for about 10seconds;maxA+2 to lateral scoot to recliner towards R side    Balance Overall balance assessment: Needs assistance Sitting-balance support: Bilateral upper extremity supported;Feet supported Sitting balance-Leahy Scale: Poor Sitting balance - Comments: reliant on BUE support initially with intermittent no UE support;LOB posteriorly x2 with physical assist from therapist to stabilize;   Standing balance support: Bilateral upper extremity supported Standing balance-Leahy Scale: Zero Standing balance comment: maxA+2 for support in standing able to tolerate standing for 10seconds, attempt to stand 2x                           ADL either performed or assessed with clinical judgement   ADL Overall ADL's : Needs assistance/impaired Eating/Feeding: Set up;Sitting   Grooming: Min guard;Minimal assistance;Sitting Grooming Details (indicate cue type and reason): minguard-minA for balance sitting EOB Upper Body Bathing: Set up;Sitting   Lower Body Bathing: Maximal assistance;+2 for physical assistance;Sit to/from stand   Upper Body Dressing : Minimal assistance;Sitting  Lower Body Dressing: Maximal assistance;+2 for physical assistance;Sit  to/from stand   Toilet Transfer: Maximal assistance;+2 for physical assistance Toilet Transfer Details (indicate cue type and reason): simulated lateral scoot to recliner toward R side Toileting- Clothing Manipulation and Hygiene: Maximal assistance;Sit to/from stand;+2 for physical assistance       Functional mobility during ADLs: Maximal assistance;+2 for physical assistance;Rolling walker General ADL Comments: required minguard-minA for balance sitting EOB;minor LOB required assist from therapists to correct;     Vision Baseline Vision/History: Wears glasses Wears Glasses: At all times Patient Visual Report: No change from baseline       Perception     Praxis      Pertinent Vitals/Pain Pain Assessment: Faces Faces Pain Scale: Hurts worst Pain Location: L leg Pain Descriptors / Indicators: Aching;Burning;Grimacing;Guarding Pain Intervention(s): Limited activity within patient's tolerance;Monitored during session     Hand Dominance Right   Extremity/Trunk Assessment Upper Extremity Assessment Upper Extremity Assessment: Overall WFL for tasks assessed   Lower Extremity Assessment Lower Extremity Assessment: LLE deficits/detail;Defer to PT evaluation LLE Deficits / Details: s/p BKA;limited knee extension;educated pt on mirroring movement with RLE;educated pt on importance of neutralizing hip and extending knee in preparation for prosthetic use in the future;educated pt on desensitization strategies (looking at limb, touching leg) LLE: Unable to fully assess due to pain LLE Sensation: decreased light touch(burning)   Cervical / Trunk Assessment Cervical / Trunk Assessment: Normal   Communication Communication Communication: No difficulties   Cognition Arousal/Alertness: Awake/alert Behavior During Therapy: WFL for tasks assessed/performed Overall Cognitive Status: Within Functional Limits for tasks assessed                                     General  Comments  HR 70s to max of 86bpm with standing and transfers; O2 >95% RA throughout;minor blood noted through dressing, RN aware    Exercises     Shoulder Instructions      Home Living Family/patient expects to be discharged to:: Private residence Living Arrangements: Spouse/significant other;Children Available Help at Discharge: Family Type of Home: Apartment Home Access: Stairs to enter Technical brewer of Steps: 3   Home Layout: Two level;Able to live on main level with bedroom/bathroom Alternate Level Stairs-Number of Steps: flight   Bathroom Shower/Tub: Tub/shower unit                    Prior Functioning/Environment Level of Independence: Independent        Comments: pt used to be a Academic librarian, independent with all ADL and no AD        OT Problem List: Decreased activity tolerance;Impaired balance (sitting and/or standing);Decreased safety awareness;Decreased knowledge of precautions;Decreased knowledge of use of DME or AE;Pain      OT Treatment/Interventions: Self-care/ADL training;Therapeutic exercise;DME and/or AE instruction;Energy conservation;Therapeutic activities;Patient/family education;Balance training    OT Goals(Current goals can be found in the care plan section) Acute Rehab OT Goals Patient Stated Goal: to get stronger and be independent OT Goal Formulation: With patient Time For Goal Achievement: 08/18/19 Potential to Achieve Goals: Good ADL Goals Pt Will Perform Grooming: with modified independence;sitting;bed level Pt Will Perform Upper Body Dressing: with modified independence;sitting;bed level Pt Will Perform Lower Body Dressing: with adaptive equipment;with min assist;sit to/from stand Pt Will Transfer to Toilet: with min assist;stand pivot transfer Additional ADL Goal #1: Pt will progress to EOB independently in preparation for ADL.  OT  Frequency: Min 2X/week   Barriers to D/C:            Co-evaluation PT/OT/SLP  Co-Evaluation/Treatment: Yes Reason for Co-Treatment: Complexity of the patient's impairments (multi-system involvement);For patient/therapist safety;To address functional/ADL transfers   OT goals addressed during session: ADL's and self-care      AM-PAC OT "6 Clicks" Daily Activity     Outcome Measure Help from another person eating meals?: A Little Help from another person taking care of personal grooming?: A Little Help from another person toileting, which includes using toliet, bedpan, or urinal?: A Lot Help from another person bathing (including washing, rinsing, drying)?: A Lot Help from another person to put on and taking off regular upper body clothing?: A Little Help from another person to put on and taking off regular lower body clothing?: A Lot 6 Click Score: 15   End of Session Equipment Utilized During Treatment: Gait belt;Rolling walker Nurse Communication: Mobility status  Activity Tolerance: Patient tolerated treatment well Patient left: in chair;with call bell/phone within reach;with chair alarm set  OT Visit Diagnosis: Unsteadiness on feet (R26.81);Other abnormalities of gait and mobility (R26.89);Muscle weakness (generalized) (M62.81);Pain;Other (comment)(L BKA) Pain - Right/Left: Left Pain - part of body: Leg                Time: 1007-1103 OT Time Calculation (min): 56 min Charges:  OT General Charges $OT Visit: 1 Visit OT Evaluation $OT Eval Moderate Complexity: 1 Mod OT Treatments $Self Care/Home Management : 8-22 mins  Dorinda Hill OTR/L Acute Rehabilitation Services Office: [phone]   Wyn Forster 08/04/2019, 12:02 PM

## 2019-08-05 ENCOUNTER — Other Ambulatory Visit: Payer: Self-pay

## 2019-08-05 ENCOUNTER — Encounter (HOSPITAL_COMMUNITY): Payer: Self-pay | Admitting: Physical Medicine and Rehabilitation

## 2019-08-05 ENCOUNTER — Inpatient Hospital Stay (HOSPITAL_COMMUNITY)
Admission: RE | Admit: 2019-08-05 | Discharge: 2019-08-13 | DRG: 560 | Disposition: A | Discharge status: Other Acute Inpatient Hospital | Payer: Medicare Other | Source: Intra-hospital | Attending: Physical Medicine and Rehabilitation | Admitting: Physical Medicine and Rehabilitation

## 2019-08-05 DIAGNOSIS — D649 Anemia, unspecified: Secondary | ICD-10-CM

## 2019-08-05 DIAGNOSIS — Y838 Other surgical procedures as the cause of abnormal reaction of the patient, or of later complication, without mention of misadventure at the time of the procedure: Secondary | ICD-10-CM | POA: Diagnosis not present

## 2019-08-05 DIAGNOSIS — D72829 Elevated white blood cell count, unspecified: Secondary | ICD-10-CM | POA: Diagnosis not present

## 2019-08-05 DIAGNOSIS — S88112A Complete traumatic amputation at level between knee and ankle, left lower leg, initial encounter: Secondary | ICD-10-CM | POA: Diagnosis present

## 2019-08-05 DIAGNOSIS — Z4781 Encounter for orthopedic aftercare following surgical amputation: Principal | ICD-10-CM

## 2019-08-05 DIAGNOSIS — F419 Anxiety disorder, unspecified: Secondary | ICD-10-CM | POA: Diagnosis present

## 2019-08-05 DIAGNOSIS — G4701 Insomnia due to medical condition: Secondary | ICD-10-CM | POA: Diagnosis not present

## 2019-08-05 DIAGNOSIS — Z794 Long term (current) use of insulin: Secondary | ICD-10-CM

## 2019-08-05 DIAGNOSIS — E871 Hypo-osmolality and hyponatremia: Secondary | ICD-10-CM | POA: Diagnosis not present

## 2019-08-05 DIAGNOSIS — I252 Old myocardial infarction: Secondary | ICD-10-CM

## 2019-08-05 DIAGNOSIS — I4821 Permanent atrial fibrillation: Secondary | ICD-10-CM | POA: Diagnosis present

## 2019-08-05 DIAGNOSIS — Z79899 Other long term (current) drug therapy: Secondary | ICD-10-CM

## 2019-08-05 DIAGNOSIS — E1122 Type 2 diabetes mellitus with diabetic chronic kidney disease: Secondary | ICD-10-CM | POA: Diagnosis present

## 2019-08-05 DIAGNOSIS — Z8249 Family history of ischemic heart disease and other diseases of the circulatory system: Secondary | ICD-10-CM

## 2019-08-05 DIAGNOSIS — Z833 Family history of diabetes mellitus: Secondary | ICD-10-CM | POA: Diagnosis not present

## 2019-08-05 DIAGNOSIS — Z880 Allergy status to penicillin: Secondary | ICD-10-CM

## 2019-08-05 DIAGNOSIS — G8918 Other acute postprocedural pain: Secondary | ICD-10-CM

## 2019-08-05 DIAGNOSIS — Z87891 Personal history of nicotine dependence: Secondary | ICD-10-CM | POA: Diagnosis not present

## 2019-08-05 DIAGNOSIS — Y92239 Unspecified place in hospital as the place of occurrence of the external cause: Secondary | ICD-10-CM | POA: Diagnosis present

## 2019-08-05 DIAGNOSIS — I2510 Atherosclerotic heart disease of native coronary artery without angina pectoris: Secondary | ICD-10-CM | POA: Diagnosis present

## 2019-08-05 DIAGNOSIS — E1142 Type 2 diabetes mellitus with diabetic polyneuropathy: Secondary | ICD-10-CM

## 2019-08-05 DIAGNOSIS — L299 Pruritus, unspecified: Secondary | ICD-10-CM | POA: Diagnosis not present

## 2019-08-05 DIAGNOSIS — G4733 Obstructive sleep apnea (adult) (pediatric): Secondary | ICD-10-CM | POA: Diagnosis present

## 2019-08-05 DIAGNOSIS — Z89512 Acquired absence of left leg below knee: Secondary | ICD-10-CM | POA: Diagnosis not present

## 2019-08-05 DIAGNOSIS — N183 Chronic kidney disease, stage 3 unspecified: Secondary | ICD-10-CM | POA: Diagnosis present

## 2019-08-05 DIAGNOSIS — J449 Chronic obstructive pulmonary disease, unspecified: Secondary | ICD-10-CM | POA: Diagnosis present

## 2019-08-05 DIAGNOSIS — I5042 Chronic combined systolic (congestive) and diastolic (congestive) heart failure: Secondary | ICD-10-CM | POA: Diagnosis present

## 2019-08-05 DIAGNOSIS — R7309 Other abnormal glucose: Secondary | ICD-10-CM | POA: Diagnosis not present

## 2019-08-05 DIAGNOSIS — Z952 Presence of prosthetic heart valve: Secondary | ICD-10-CM | POA: Diagnosis not present

## 2019-08-05 DIAGNOSIS — M1990 Unspecified osteoarthritis, unspecified site: Secondary | ICD-10-CM | POA: Diagnosis present

## 2019-08-05 DIAGNOSIS — R339 Retention of urine, unspecified: Secondary | ICD-10-CM | POA: Diagnosis present

## 2019-08-05 DIAGNOSIS — I130 Hypertensive heart and chronic kidney disease with heart failure and stage 1 through stage 4 chronic kidney disease, or unspecified chronic kidney disease: Secondary | ICD-10-CM | POA: Diagnosis present

## 2019-08-05 DIAGNOSIS — F329 Major depressive disorder, single episode, unspecified: Secondary | ICD-10-CM | POA: Diagnosis present

## 2019-08-05 DIAGNOSIS — Z7901 Long term (current) use of anticoagulants: Secondary | ICD-10-CM | POA: Diagnosis not present

## 2019-08-05 DIAGNOSIS — E1140 Type 2 diabetes mellitus with diabetic neuropathy, unspecified: Secondary | ICD-10-CM | POA: Diagnosis present

## 2019-08-05 DIAGNOSIS — Z8349 Family history of other endocrine, nutritional and metabolic diseases: Secondary | ICD-10-CM

## 2019-08-05 DIAGNOSIS — G4700 Insomnia, unspecified: Secondary | ICD-10-CM | POA: Diagnosis present

## 2019-08-05 DIAGNOSIS — Z9049 Acquired absence of other specified parts of digestive tract: Secondary | ICD-10-CM

## 2019-08-05 DIAGNOSIS — R252 Cramp and spasm: Secondary | ICD-10-CM | POA: Diagnosis not present

## 2019-08-05 DIAGNOSIS — D62 Acute posthemorrhagic anemia: Secondary | ICD-10-CM | POA: Diagnosis present

## 2019-08-05 DIAGNOSIS — T40605A Adverse effect of unspecified narcotics, initial encounter: Secondary | ICD-10-CM | POA: Diagnosis present

## 2019-08-05 DIAGNOSIS — I998 Other disorder of circulatory system: Secondary | ICD-10-CM | POA: Diagnosis not present

## 2019-08-05 DIAGNOSIS — N179 Acute kidney failure, unspecified: Secondary | ICD-10-CM

## 2019-08-05 DIAGNOSIS — K5903 Drug induced constipation: Secondary | ICD-10-CM | POA: Diagnosis present

## 2019-08-05 DIAGNOSIS — Z7951 Long term (current) use of inhaled steroids: Secondary | ICD-10-CM

## 2019-08-05 DIAGNOSIS — Z888 Allergy status to other drugs, medicaments and biological substances status: Secondary | ICD-10-CM

## 2019-08-05 DIAGNOSIS — G479 Sleep disorder, unspecified: Secondary | ICD-10-CM | POA: Diagnosis not present

## 2019-08-05 DIAGNOSIS — M791 Myalgia, unspecified site: Secondary | ICD-10-CM | POA: Diagnosis present

## 2019-08-05 DIAGNOSIS — T8189XA Other complications of procedures, not elsewhere classified, initial encounter: Secondary | ICD-10-CM

## 2019-08-05 LAB — GLUCOSE, CAPILLARY
Glucose-Capillary: 177 mg/dL — ABNORMAL HIGH (ref 70–99)
Glucose-Capillary: 231 mg/dL — ABNORMAL HIGH (ref 70–99)
Glucose-Capillary: 136 mg/dL — ABNORMAL HIGH (ref 70–99)
Glucose-Capillary: 209 mg/dL — ABNORMAL HIGH (ref 70–99)

## 2019-08-05 LAB — PROTIME-INR
INR: 1.7 — ABNORMAL HIGH (ref 0.8–1.2)
Prothrombin Time: 19.5 seconds — ABNORMAL HIGH (ref 11.4–15.2)

## 2019-08-05 LAB — HEPARIN LEVEL (UNFRACTIONATED): Heparin Unfractionated: 0.59 IU/mL (ref 0.30–0.70)

## 2019-08-05 MED ORDER — TORSEMIDE 20 MG PO TABS
20.0000 mg | ORAL_TABLET | Freq: Every day | ORAL | Status: DC
  Administered 2019-08-05 – 2019-08-12 (×8): 20 mg via ORAL
  Filled 2019-08-05 (×11): qty 1

## 2019-08-05 MED ORDER — PROCHLORPERAZINE MALEATE 5 MG PO TABS: 5.0000 mg | ORAL_TABLET | Freq: Four times a day (QID) | ORAL | Status: DC | PRN

## 2019-08-05 MED ORDER — ALBUTEROL SULFATE (2.5 MG/3ML) 0.083% IN NEBU: 3.0000 mL | INHALATION_SOLUTION | Freq: Four times a day (QID) | RESPIRATORY_TRACT | Status: DC | PRN

## 2019-08-05 MED ORDER — HYDROCODONE-ACETAMINOPHEN 10-325 MG PO TABS
1.0000 | ORAL_TABLET | ORAL | Status: DC | PRN
  Administered 2019-08-05 – 2019-08-13 (×24): 1 via ORAL
  Filled 2019-08-05 (×27): qty 1

## 2019-08-05 MED ORDER — WARFARIN SODIUM 7.5 MG PO TABS: 15.0000 mg | ORAL_TABLET | Freq: Once | ORAL | Status: DC

## 2019-08-05 MED ORDER — TRAMADOL HCL 50 MG PO TABS
50.0000 mg | ORAL_TABLET | Freq: Four times a day (QID) | ORAL | Status: DC | PRN
  Administered 2019-08-07 – 2019-08-12 (×11): 50 mg via ORAL
  Filled 2019-08-05 (×14): qty 1

## 2019-08-05 MED ORDER — SODIUM CHLORIDE 0.9 % IV SOLN
1.0000 g | INTRAVENOUS | Status: AC
  Administered 2019-08-05: 22:00:00 1 g via INTRAVENOUS
  Filled 2019-08-05: qty 1

## 2019-08-05 MED ORDER — TORSEMIDE 20 MG PO TABS
40.0000 mg | ORAL_TABLET | Freq: Every day | ORAL | Status: DC
  Administered 2019-08-06 – 2019-08-12 (×7): 40 mg via ORAL
  Filled 2019-08-05 (×9): qty 2

## 2019-08-05 MED ORDER — VITAMIN C 500 MG PO TABS
500.0000 mg | ORAL_TABLET | Freq: Two times a day (BID) | ORAL | Status: DC
  Administered 2019-08-05 – 2019-08-12 (×15): 500 mg via ORAL
  Filled 2019-08-05 (×15): qty 1

## 2019-08-05 MED ORDER — SACCHAROMYCES BOULARDII 250 MG PO CAPS
250.0000 mg | ORAL_CAPSULE | ORAL | Status: DC
  Administered 2019-08-07 – 2019-08-12 (×3): 250 mg via ORAL
  Filled 2019-08-05 (×3): qty 1

## 2019-08-05 MED ORDER — B COMPLEX-C PO TABS
1.0000 | ORAL_TABLET | Freq: Every day | ORAL | Status: DC
  Administered 2019-08-06 – 2019-08-12 (×7): 1 via ORAL
  Filled 2019-08-05 (×8): qty 1

## 2019-08-05 MED ORDER — VITAMIN D 25 MCG (1000 UNIT) PO TABS
1000.0000 [IU] | ORAL_TABLET | Freq: Every day | ORAL | Status: DC
  Administered 2019-08-06 – 2019-08-12 (×7): 1000 [IU] via ORAL
  Filled 2019-08-05 (×7): qty 1

## 2019-08-05 MED ORDER — TAMSULOSIN HCL 0.4 MG PO CAPS
0.4000 mg | ORAL_CAPSULE | Freq: Every day | ORAL | Status: DC
  Administered 2019-08-05 – 2019-08-07 (×3): 0.4 mg via ORAL
  Filled 2019-08-05 (×3): qty 1

## 2019-08-05 MED ORDER — MOMETASONE FURO-FORMOTEROL FUM 200-5 MCG/ACT IN AERO
2.0000 | INHALATION_SPRAY | Freq: Two times a day (BID) | RESPIRATORY_TRACT | Status: DC
  Administered 2019-08-05 – 2019-08-13 (×16): 2 via RESPIRATORY_TRACT
  Filled 2019-08-05: qty 8.8

## 2019-08-05 MED ORDER — FENOFIBRATE 160 MG PO TABS
160.0000 mg | ORAL_TABLET | Freq: Every day | ORAL | Status: DC
  Administered 2019-08-06 – 2019-08-12 (×7): 160 mg via ORAL
  Filled 2019-08-05 (×8): qty 1

## 2019-08-05 MED ORDER — ESCITALOPRAM OXALATE 10 MG PO TABS
10.0000 mg | ORAL_TABLET | Freq: Every day | ORAL | Status: DC
  Administered 2019-08-05 – 2019-08-12 (×8): 10 mg via ORAL
  Filled 2019-08-05 (×8): qty 1

## 2019-08-05 MED ORDER — B COMPLEX-C PO TABS: 1.0000 | ORAL_TABLET | Freq: Every day | ORAL | 0 refills | Status: AC

## 2019-08-05 MED ORDER — GUAIFENESIN-DM 100-10 MG/5ML PO SYRP: 5.0000 mL | ORAL_SOLUTION | Freq: Four times a day (QID) | ORAL | Status: DC | PRN

## 2019-08-05 MED ORDER — GABAPENTIN 300 MG PO CAPS
300.0000 mg | ORAL_CAPSULE | Freq: Three times a day (TID) | ORAL | Status: DC
  Administered 2019-08-05 – 2019-08-12 (×22): 300 mg via ORAL
  Filled 2019-08-05 (×22): qty 1

## 2019-08-05 MED ORDER — BISACODYL 10 MG RE SUPP
10.0000 mg | Freq: Every day | RECTAL | Status: DC | PRN
  Administered 2019-08-09: 10 mg via RECTAL
  Filled 2019-08-05: qty 1

## 2019-08-05 MED ORDER — PROCHLORPERAZINE EDISYLATE 10 MG/2ML IJ SOLN: 5.0000 mg | Freq: Four times a day (QID) | INTRAMUSCULAR | Status: DC | PRN

## 2019-08-05 MED ORDER — INSULIN ASPART 100 UNIT/ML ~~LOC~~ SOLN
0.0000 [IU] | Freq: Three times a day (TID) | SUBCUTANEOUS | Status: DC
  Administered 2019-08-05: 3 [IU] via SUBCUTANEOUS
  Administered 2019-08-06: 4 [IU] via SUBCUTANEOUS
  Administered 2019-08-06: 18:00:00 11 [IU] via SUBCUTANEOUS
  Administered 2019-08-07: 3 [IU] via SUBCUTANEOUS
  Administered 2019-08-07: 13:00:00 4 [IU] via SUBCUTANEOUS
  Administered 2019-08-07 – 2019-08-08 (×2): 3 [IU] via SUBCUTANEOUS
  Administered 2019-08-08 (×2): 7 [IU] via SUBCUTANEOUS
  Administered 2019-08-09 (×2): 4 [IU] via SUBCUTANEOUS
  Administered 2019-08-10: 09:00:00 7 [IU] via SUBCUTANEOUS
  Administered 2019-08-10: 4 [IU] via SUBCUTANEOUS
  Administered 2019-08-10: 19:00:00 7 [IU] via SUBCUTANEOUS
  Administered 2019-08-11: 18:00:00 4 [IU] via SUBCUTANEOUS
  Administered 2019-08-11: 7 [IU] via SUBCUTANEOUS
  Administered 2019-08-11 – 2019-08-12 (×3): 4 [IU] via SUBCUTANEOUS
  Administered 2019-08-12: 08:00:00 3 [IU] via SUBCUTANEOUS
  Administered 2019-08-13: 11:00:00 4 [IU] via SUBCUTANEOUS
  Administered 2019-08-13: 13:00:00 3 [IU] via SUBCUTANEOUS

## 2019-08-05 MED ORDER — EZETIMIBE 10 MG PO TABS
10.0000 mg | ORAL_TABLET | Freq: Every day | ORAL | Status: DC
  Administered 2019-08-06 – 2019-08-12 (×7): 10 mg via ORAL
  Filled 2019-08-05 (×7): qty 1

## 2019-08-05 MED ORDER — OXYCODONE-ACETAMINOPHEN 10-325 MG PO TABS: 1.0000 | ORAL_TABLET | ORAL | 0 refills | Status: DC | PRN

## 2019-08-05 MED ORDER — WARFARIN - PHARMACIST DOSING INPATIENT
Freq: Every day | Status: DC
  Administered 2019-08-05 – 2019-08-07 (×3)

## 2019-08-05 MED ORDER — ACETAMINOPHEN 325 MG PO TABS
325.0000 mg | ORAL_TABLET | ORAL | Status: DC | PRN
  Administered 2019-08-12: 11:00:00 650 mg via ORAL
  Filled 2019-08-05: qty 2

## 2019-08-05 MED ORDER — HEPARIN (PORCINE) 25000 UT/250ML-% IV SOLN: 2650.0000 [IU]/h | INTRAVENOUS | 0 refills | Status: DC

## 2019-08-05 MED ORDER — POLYETHYLENE GLYCOL 3350 17 G PO PACK
17.0000 g | PACK | Freq: Every day | ORAL | Status: DC | PRN
  Administered 2019-08-06 – 2019-08-11 (×2): 17 g via ORAL
  Filled 2019-08-05 (×3): qty 1

## 2019-08-05 MED ORDER — GLIMEPIRIDE 1 MG PO TABS
2.0000 mg | ORAL_TABLET | Freq: Every day | ORAL | Status: DC
  Administered 2019-08-06 – 2019-08-12 (×7): 2 mg via ORAL
  Filled 2019-08-05 (×8): qty 2

## 2019-08-05 MED ORDER — INSULIN GLARGINE 100 UNIT/ML ~~LOC~~ SOLN
30.0000 [IU] | Freq: Two times a day (BID) | SUBCUTANEOUS | Status: DC
  Administered 2019-08-05 – 2019-08-09 (×8): 30 [IU] via SUBCUTANEOUS
  Filled 2019-08-05 (×9): qty 0.3

## 2019-08-05 MED ORDER — TAMSULOSIN HCL 0.4 MG PO CAPS: 0.4000 mg | ORAL_CAPSULE | Freq: Every day | ORAL | 0 refills | Status: DC

## 2019-08-05 MED ORDER — HEPARIN (PORCINE) 25000 UT/250ML-% IV SOLN
2100.0000 [IU]/h | INTRAVENOUS | Status: DC
  Administered 2019-08-05: 2650 [IU]/h via INTRAVENOUS
  Administered 2019-08-06: 22:00:00 2300 [IU]/h via INTRAVENOUS
  Administered 2019-08-06: 12:00:00 2500 [IU]/h via INTRAVENOUS
  Administered 2019-08-06: 2650 [IU]/h via INTRAVENOUS
  Administered 2019-08-07 – 2019-08-09 (×6): 2300 [IU]/h via INTRAVENOUS
  Administered 2019-08-09: 2100 [IU]/h via INTRAVENOUS
  Filled 2019-08-05 (×13): qty 250

## 2019-08-05 MED ORDER — METOPROLOL SUCCINATE ER 25 MG PO TB24
25.0000 mg | ORAL_TABLET | Freq: Two times a day (BID) | ORAL | Status: DC
  Administered 2019-08-06 – 2019-08-13 (×13): 25 mg via ORAL
  Filled 2019-08-05 (×16): qty 1

## 2019-08-05 MED ORDER — DIGOXIN 125 MCG PO TABS
0.1250 mg | ORAL_TABLET | Freq: Every day | ORAL | Status: DC
  Administered 2019-08-06 – 2019-08-13 (×8): 0.125 mg via ORAL
  Filled 2019-08-05 (×8): qty 1

## 2019-08-05 MED ORDER — PROCHLORPERAZINE 25 MG RE SUPP: 12.5000 mg | Freq: Four times a day (QID) | RECTAL | Status: DC | PRN

## 2019-08-05 MED ORDER — FLEET ENEMA 7-19 GM/118ML RE ENEM
1.0000 | ENEMA | Freq: Once | RECTAL | Status: AC | PRN
  Administered 2019-08-12: 1 via RECTAL
  Filled 2019-08-05: qty 1

## 2019-08-05 MED ORDER — WARFARIN SODIUM 7.5 MG PO TABS
15.0000 mg | ORAL_TABLET | Freq: Once | ORAL | Status: AC
  Administered 2019-08-05: 15 mg via ORAL
  Filled 2019-08-05: qty 2

## 2019-08-05 MED ORDER — INSULIN ASPART 100 UNIT/ML ~~LOC~~ SOLN
0.0000 [IU] | Freq: Every day | SUBCUTANEOUS | Status: DC
  Administered 2019-08-05 – 2019-08-07 (×3): 2 [IU] via SUBCUTANEOUS
  Administered 2019-08-08: 4 [IU] via SUBCUTANEOUS
  Administered 2019-08-09: 21:00:00 3 [IU] via SUBCUTANEOUS
  Administered 2019-08-10 – 2019-08-11 (×2): 2 [IU] via SUBCUTANEOUS
  Administered 2019-08-12: 3 [IU] via SUBCUTANEOUS

## 2019-08-05 MED ORDER — NITROGLYCERIN 0.4 MG SL SUBL: 0.4000 mg | SUBLINGUAL_TABLET | SUBLINGUAL | Status: DC | PRN

## 2019-08-05 MED ORDER — ALUM & MAG HYDROXIDE-SIMETH 200-200-20 MG/5ML PO SUSP: 30.0000 mL | ORAL | Status: DC | PRN

## 2019-08-05 MED ORDER — WARFARIN SODIUM 7.5 MG PO TABS: 15.0000 mg | ORAL_TABLET | Freq: Once | ORAL | 0 refills | Status: DC

## 2019-08-05 MED ORDER — TRAZODONE HCL 50 MG PO TABS
25.0000 mg | ORAL_TABLET | Freq: Every evening | ORAL | Status: DC | PRN
  Administered 2019-08-07 – 2019-08-11 (×4): 50 mg via ORAL
  Filled 2019-08-05 (×5): qty 1

## 2019-08-05 MED ORDER — GABAPENTIN 300 MG PO CAPS: 300.0000 mg | ORAL_CAPSULE | Freq: Three times a day (TID) | ORAL | 0 refills | Status: DC

## 2019-08-05 MED ORDER — DIPHENHYDRAMINE HCL 12.5 MG/5ML PO ELIX: 12.5000 mg | ORAL_SOLUTION | Freq: Four times a day (QID) | ORAL | Status: DC | PRN

## 2019-08-05 NOTE — H&P (Signed)
Physical Medicine and Rehabilitation Admission H&P    CC: functional deficits due to BKA   HPI: Joseph Hoffman is a 68 year old male with history of HTN, T2DM, CAF, mechanical AVR (leaking)- on coumadin,  CAD--pending CABG, systolic CHF, CKD, OSA--CPAP with 2 L oxygen, left great toe amputation 07/03/19  with poor healing and purulent drainage. History taken from chart review and patient. He was admitted on 07/29/2019 with N/V, diarrhea and left foot infection. He was started on IV abx. He underwent L-BKA on  07/31/2019 by Dr. Carlis Abbott. Hospital course complicated by lethargy and confusion, thought to be secondary to narcotics, hypotensions and AKI.  Entersto was discontinued on 08/21. He continues on heparin/Coumadin bridge as INR subtherapeutic and stump sock ordered from [employer]. Therapy evaluations completed and aptient limited by BKA as well neuropathy. CIR recommended due to functional decline. Please also see preadmission assessment from earlier today.     Review of Systems  Constitutional: Negative for chills and fever.  HENT: Negative for hearing loss and tinnitus.   Eyes: Negative for blurred vision and double vision.  Respiratory: Positive for shortness of breath (difficutly climbing stairs).   Cardiovascular: Positive for leg swelling. Negative for chest pain and palpitations.  Gastrointestinal: Positive for constipation. Negative for heartburn and nausea.  Genitourinary: Negative for dysuria and urgency.  Musculoskeletal: Positive for myalgias. Negative for neck pain.  Skin: Negative for itching and rash.  Neurological: Positive for sensory change (decreased in right foot). Negative for focal weakness and weakness.  Psychiatric/Behavioral: Negative for hallucinations. The patient has insomnia.      Past Medical History:  Diagnosis Date  . Anxiety   . Aortic stenosis   . Arthritis   . Asthma   . Asymptomatic LV dysfunction, EF 45%, normal coronary arteries on cardiac cath  09/18/12 09/30/2012  . CHF (congestive heart failure) ([place])   . Chronic anticoagulation, on Xarelto prior to admit 09/30/2012  . DM (diabetes mellitus) ([place]) 09/30/2012  . Hepatitis 2003  . Hypertension   . Myocardial infarction ([place])   . Permanent atrial fibrillation, since 1994 09/30/2012   stress test 02/08/12- normal study, no significant ischemia  . S/P AVR (aortic valve replacement), 09/30/2012   a. s/p mechcanical AVR in 09/2012 (on Coumadin)  . Sleep apnea    uses CPAP    Past Surgical History:  Procedure Laterality Date  . ABDOMINAL ANGIOGRAM  09/18/2012   Procedure: ABDOMINAL ANGIOGRAM;  Surgeon: Troy Sine, MD;  Location: [facility] CATH LAB;  Service: Cardiovascular;;  . ABDOMINAL AORTOGRAM W/LOWER EXTREMITY N/A 07/02/2019   Procedure: ABDOMINAL AORTOGRAM W/LOWER EXTREMITY;  Surgeon: Marty Heck, MD;  Location: [place] CV LAB;  Service: Cardiovascular;  Laterality: N/A;  . AMPUTATION Left 07/03/2019   Procedure: AMPUTATION LEFT GREAT TOE;  Surgeon: Marty Heck, MD;  Location: [place];  Service: Vascular;  Laterality: Left;  . AMPUTATION Left 07/31/2019   Procedure: AMPUTATION BELOW KNEE LEFT;  Surgeon: Marty Heck, MD;  Location: [place];  Service: Vascular;  Laterality: Left;  . AORTIC VALVE REPLACEMENT  09/29/2012   Procedure: AORTIC VALVE REPLACEMENT (AVR);  Surgeon: Gaye Pollack, MD;  Location: [place];  Service: Open Heart Surgery;  Laterality: N/A;  . ARCH AORTOGRAM  09/18/2012   Procedure: ARCH AORTOGRAM;  Surgeon: Troy Sine, MD;  Location: [facility] CATH LAB;  Service: Cardiovascular;;  . CARDIAC CATHETERIZATION  09/18/12   severe calcific aortic stenosis, peak gradient 77mm, mean gradient 43mm, EF 45%  .  CARDIAC VALVE REPLACEMENT     AVR 09-29-12  . CHOLECYSTECTOMY    . FRACTURE SURGERY    . LEFT AND RIGHT HEART CATHETERIZATION WITH CORONARY ANGIOGRAM N/A 09/18/2012   Procedure: LEFT AND RIGHT HEART CATHETERIZATION WITH CORONARY ANGIOGRAM;   Surgeon: Troy Sine, MD;  Location: [facility] CATH LAB;  Service: Cardiovascular;  Laterality: N/A;  . PERIPHERAL VASCULAR INTERVENTION Left 07/02/2019   Procedure: PERIPHERAL VASCULAR INTERVENTION;  Surgeon: Marty Heck, MD;  Location: [place] CV LAB;  Service: Cardiovascular;  Laterality: Left;  . RIGHT HEART CATH AND CORONARY ANGIOGRAPHY N/A 06/29/2019   Procedure: RIGHT HEART CATH AND CORONARY ANGIOGRAPHY;  Surgeon: Nelva Bush, MD;  Location: [place] CV LAB;  Service: Cardiovascular;  Laterality: N/A;  . TEE WITHOUT CARDIOVERSION N/A 05/29/2019   Procedure: TRANSESOPHAGEAL ECHOCARDIOGRAM (TEE);  Surgeon: Lelon Perla, MD;  Location: [facility] ENDOSCOPY;  Service: Cardiovascular;  Laterality: N/A;    Family History  Problem Relation Age of Onset  . Hypertension Mother   . Diabetes Father   . Heart attack Brother 38  . Hyperlipidemia Brother 34       stents placed    Social History:   Married. Wife is a retired Education officer, environmental. 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 He reports that he quit smoking about 36 years ago. His smoking use included cigarettes. He has a 2.50 pack-year smoking history. He has quit using smokeless tobacco. He reports he uses a glass of wine/one beer daily. He reports that he does not use drugs.    Allergies  Allergen Reactions  . Iohexol Anaphylaxis  . Niacin And Related     Flushing with immediate realese  . Penicillins Other (See Comments)    Unknown.[name]  Did it involve swelling of the face/tongue/throat, SOB, or low BP? Unknown Did it involve sudden or severe rash/hives, skin peeling, or any reaction on the inside of your mouth or nose? Unknown Did you need to seek medical attention at a hospital or doctor's office? Unknown When did it last happen?Childhood If all above answers are "NO", may proceed with cephalosporin use.    Medications Prior to Admission  Medication Sig Dispense Refill  . acetaminophen (TYLENOL) 325  MG tablet Take 2 tablets (650 mg total) by mouth every 4 (four) hours as needed for pain or fever. (Patient taking differently: Take 650 mg by mouth every 4 (four) hours as needed for fever or headache (pain). )    . albuterol (PROVENTIL HFA;VENTOLIN HFA) 108 (90 BASE) MCG/ACT inhaler Inhale 2 puffs into the lungs every 6 (six) hours as needed for wheezing or shortness of breath.     [name] b complex vitamins tablet Take 1 tablet by mouth daily with breakfast.    . cholecalciferol (VITAMIN D) 1000 UNITS tablet Take 1,000 Units by mouth daily with breakfast.     . Chromium 500 MCG TABS Take 500 mcg by mouth every evening.    . digoxin (LANOXIN) 0.125 MG tablet TAKE 1 TABLET EVERY DAY (Patient taking differently: Take 0.125 mg by mouth daily. ) 90 tablet 1  . empagliflozin (JARDIANCE) 25 MG TABS tablet Take 25 mg by mouth daily. 90 tablet 1  . escitalopram (LEXAPRO) 10 MG tablet Take 10 mg by mouth at bedtime.     [name] ezetimibe (ZETIA) 10 MG tablet Take 1 tablet (10 mg total) by mouth daily. 90 tablet 3  . fenofibrate 160 MG tablet Take 1 tablet (160 mg total) by mouth daily. (Patient  taking differently: Take 160 mg by mouth daily with breakfast. ) 30 tablet 1  . Fluticasone-Salmeterol (ADVAIR) 250-50 MCG/DOSE AEPB Inhale 1 puff into the lungs 2 (two) times daily as needed (shortness of breath/asthma related symptoms).     [name] glimepiride (AMARYL) 2 MG tablet Take 2 mg by mouth daily with breakfast.     . Glucosamine-Chondroit-Vit C-Mn (GLUCOSAMINE 1500 COMPLEX PO) Take 1 tablet by mouth daily as needed (arthritis flare up).     Vanessa Kick Ethyl (VASCEPA) 1 g CAPS Take 2 capsules (2 g total) by mouth 2 (two) times daily. (Patient taking differently: Take 2 g by mouth 2 (two) times daily with a meal. ) 360 capsule 3  . insulin glargine (LANTUS) 100 UNIT/ML injection Inject 15-40 Units into the skin See admin instructions. Inject 15 - 40 units subcutaneously daily before breakfast (adjusted per CBG); inject 30-40  units at bedtime ( occasionally adjusted per CBG - usually 35 units)    . liraglutide (VICTOZA) 18 MG/3ML SOPN Inject 1.8 mg into the skin at bedtime.    . metFORMIN (GLUCOPHAGE) 500 MG tablet Take 2,000 mg by mouth daily with supper.    . metoprolol succinate (TOPROL XL) 25 MG 24 hr tablet Take 1 tablet (25 mg total) by mouth 2 (two) times daily. 90 tablet 1  . nitroGLYCERIN (NITROSTAT) 0.4 MG SL tablet Place 1 tablet (0.4 mg total) under the tongue every 5 (five) minutes as needed for chest pain. (Patient taking differently: Place 0.4 mg under the tongue every 5 (five) minutes x 3 doses as needed for chest pain. ) 30 tablet 2  . saccharomyces boulardii (FLORASTOR) 250 MG capsule Take 250 mg by mouth 3 (three) times a week.     . sacubitril-valsartan (ENTRESTO) 24-26 MG Take 1 tablet by mouth 2 (two) times daily. 60 tablet 3  . torsemide (DEMADEX) 20 MG tablet Take 2 tablets (40 mg total) by mouth every morning. (Patient taking differently: Take 20-40 mg by mouth See admin instructions. Take 2 tablets (40 mg) by mouth daily in the morning & take 1 tablet (20 mg) by mouth at night) 60 tablet 3  . vitamin C (ASCORBIC ACID) 500 MG tablet Take 500 mg by mouth 2 (two) times daily.    [name] warfarin (COUMADIN) 10 MG tablet Take 10 mg by mouth at bedtime. Take 1 tablet (10 mg) by mouth with 0.5 tablet 5 mg (2.5 mg) on Tuesdays, Thursdays, & Saturdays; ONLY take 1 tablet (10 mg) by mouth on Sundays, Mondays, Wednesdays, & Fridays.    [name] warfarin (COUMADIN) 5 MG tablet Take as instructed with 10 mg. (Patient taking differently: Take 2.5 mg by mouth See admin instructions. Take 0.5 tablet (2.5 mg) by mouth with 1 tablet 10 mg (12.5 mg) on Tuesdays, Thursdays, & Saturdays at bedtime) 90 tablet 1  . B-D ULTRAFINE III SHORT PEN 31G X 8 MM MISC     . EASY TOUCH LANCETS 30G/TWIST MISC     . enoxaparin (LOVENOX) 120 MG/0.8ML injection Inject 0.8 mLs (120 mg total) into the skin 2 (two) times daily for 5 days. (Patient not  taking: Reported on 07/29/2019) 8 mL 0    Drug Regimen Review  Drug regimen was reviewed and remains appropriate with no significant issues identified  Home: Home Living Family/patient expects to be discharged to:: Private residence Living Arrangements: Spouse/significant other, Children Available Help at Discharge: Family Type of Home: Apartment Home Access: Stairs to enter [employer] of Steps: 3 Home Layout:  Two level, Able to live on main level with bedroom/bathroom Alternate Level Stairs-Number of Steps: flight Bathroom Shower/Tub: Tub/shower unit   Functional History: Prior Function Level of Independence: Independent Comments: pt used to be a Academic librarian, independent with all ADL and no AD  Functional Status:  Mobility: Bed Mobility Overal bed mobility: Needs Assistance Bed Mobility: Supine to Sit Supine to sit: Max assist, +2 for physical assistance, HOB elevated General bed mobility comments: assist to progress BLE off EOB and progress trunk to upright position; Transfers Overall transfer level: Needs assistance Equipment used: Rolling walker (2 wheeled) Transfers: Sit to/from Stand, Lateral/Scoot Transfers Sit to Stand: Max assist, +2 physical assistance  Lateral/Scoot Transfers: Max assist, +2 physical assistance General transfer comment: maxA+2 to progress to upright standing;cues to extend through RLE, pelvic control and progress trunk upright, able to tolerate standing for about 10seconds;maxA+2 to lateral scoot to recliner towards R side      ADL: ADL Overall ADL's : Needs assistance/impaired Eating/Feeding: Set up, Sitting Grooming: Min guard, Minimal assistance, Sitting Grooming Details (indicate cue type and reason): minguard-minA for balance sitting EOB Upper Body Bathing: Set up, Sitting Lower Body Bathing: Maximal assistance, +2 for physical assistance, Sit to/from stand Upper Body Dressing : Minimal assistance, Sitting Lower Body  Dressing: Maximal assistance, +2 for physical assistance, Sit to/from stand Toilet Transfer: Maximal assistance, +2 for physical assistance Toilet Transfer Details (indicate cue type and reason): simulated lateral scoot to recliner toward R side Toileting- Clothing Manipulation and Hygiene: Maximal assistance, Sit to/from stand, +2 for physical assistance Functional mobility during ADLs: Maximal assistance, +2 for physical assistance, Rolling walker General ADL Comments: required minguard-minA for balance sitting EOB;minor LOB required assist from therapists to correct;  Cognition: Cognition Overall Cognitive Status: Within Functional Limits for tasks assessed Orientation Level: Oriented X4 Cognition Arousal/Alertness: Awake/alert Behavior During Therapy: WFL for tasks assessed/performed Overall Cognitive Status: Within Functional Limits for tasks assessed   Blood pressure (!) 126/50, pulse 62, temperature 99.9 F (37.7 C), temperature source Oral, resp. rate 18, height 5\' 11"  (1.803 m), weight 109.8 kg, SpO2 97 %. Physical Exam  Nursing note and vitals reviewed. Constitutional: He is oriented to person, place, and time. He appears well-developed. No distress.  Morbidly obese  HENT:  Head: Normocephalic and atraumatic.  Eyes: EOM are normal. Right eye exhibits no discharge. Left eye exhibits no discharge.  Neck: No tracheal deviation present. No thyromegaly present.  Respiratory: Effort normal. No respiratory distress.  GI: He exhibits no distension.  Musculoskeletal:     Comments: Left stump edema and tenderness  Neurological: He is alert and oriented to person, place, and time.  Motor: B/l UE, RLE: 5/5 proximal to distal LLE: HF 3/5 (pain inhibition) Sensation grossly intact to light touch  Skin: He is not diaphoretic.  Left stump with dressing c/d/i  Psychiatric: He has a normal mood and affect. His behavior is normal.  Left foot with small ulcer on 5th toe  L-BKa with two  fluid filled blisters. Ecchymotic area on lateral aspect.    Results for orders placed or performed during the hospital encounter of 07/29/19 (from the past 48 hour(s))  Brain natriuretic peptide     Status: Abnormal   Collection Time: 08/03/19  1:28 PM  Result Value Ref Range   B Natriuretic Peptide 168.4 (H) 0.0 - 100.0 pg/mL    Comment: Performed at [facility], [address], [place], [place] [address]  Glucose, capillary     Status: Abnormal  Collection Time: 08/03/19  5:30 PM  Result Value Ref Range   Glucose-Capillary 284 (H) 70 - 99 mg/dL  Glucose, capillary     Status: Abnormal   Collection Time: 08/03/19  9:29 PM  Result Value Ref Range   Glucose-Capillary 276 (H) 70 - 99 mg/dL   Comment 1 Notify RN    Comment 2 Document in Chart   Urinalysis, Routine w reflex microscopic     Status: Abnormal   Collection Time: 08/03/19 10:25 PM  Result Value Ref Range   Color, Urine YELLOW YELLOW   APPearance CLEAR CLEAR   Specific Gravity, Urine 1.008 1.005 - 1.030   pH 5.0 5.0 - 8.0   Glucose, UA 150 (A) NEGATIVE mg/dL   Hgb urine dipstick NEGATIVE NEGATIVE   Bilirubin Urine NEGATIVE NEGATIVE   Ketones, ur NEGATIVE NEGATIVE mg/dL   Protein, ur NEGATIVE NEGATIVE mg/dL   Nitrite NEGATIVE NEGATIVE   Leukocytes,Ua NEGATIVE NEGATIVE    Comment: Performed at [facility], [place] [address] [place], [place] [address]  Sodium, urine, random     Status: None   Collection Time: 08/03/19 10:25 PM  Result Value Ref Range   Sodium, Ur 40 mmol/L    Comment: Performed at [facility] [address] [place], [place] [address]  Heparin level (unfractionated)     Status: None   Collection Time: 08/04/19  4:10 AM  Result Value Ref Range   Heparin Unfractionated 0.63 0.30 - 0.70 IU/mL    Comment: (NOTE) If heparin results are below expected values, and patient dosage has  been confirmed, suggest follow up testing of antithrombin III levels. Performed at [place], [place] [address], [place], [place] [address]   Protime-INR     Status: Abnormal   Collection Time: 08/04/19  4:10 AM  Result Value Ref Range   Prothrombin Time 16.0 (H) 11.4 - 15.2 seconds   INR 1.3 (H) 0.8 - 1.2    Comment: (NOTE) INR goal varies based on device and disease states. Performed at [facility], [place] [address], [place], [place] [address]   Glucose, capillary     Status: Abnormal   Collection Time: 08/04/19  7:54 AM  Result Value Ref Range   Glucose-Capillary 129 (H) 70 - 99 mg/dL  Glucose, capillary     Status: Abnormal   Collection Time: 08/04/19  1:00 PM  Result Value Ref Range   Glucose-Capillary 252 (H) 70 - 99 mg/dL   Comment 1 Notify RN    Comment 2 Document in Chart   Glucose, capillary     Status: Abnormal   Collection Time: 08/04/19  3:26 PM  Result Value Ref Range   Glucose-Capillary 259 (H) 70 - 99 mg/dL   Comment 1 Notify RN    Comment 2 Document in Chart   Glucose, capillary     Status: Abnormal   Collection Time: 08/04/19 10:06 PM  Result Value Ref Range   Glucose-Capillary 237 (H) 70 - 99 mg/dL  Heparin level (unfractionated)     Status: None   Collection Time: 08/05/19  5:16 AM  Result Value Ref Range   Heparin Unfractionated 0.59 0.30 - 0.70 IU/mL    Comment: (NOTE) If heparin results are below expected values, and patient dosage has  been confirmed, suggest follow up testing of antithrombin III levels. Performed at [facility], [place] [address] [place], [place] [address]   Protime-INR     Status: Abnormal  Collection Time: 08/05/19  5:16 AM  Result Value Ref Range   Prothrombin Time 19.5 (H) 11.4 - 15.2 seconds   INR 1.7 (H) 0.8 - 1.2    Comment: (NOTE) INR goal varies based on device and disease states. Performed at [facility], [place] [address] [place], [place] [address]   Glucose, capillary     Status: Abnormal   Collection Time: 08/05/19  8:37 AM  Result Value Ref Range   Glucose-Capillary 177 (H) 70 - 99  mg/dL  Glucose, capillary     Status: Abnormal   Collection Time: 08/05/19 12:11 PM  Result Value Ref Range   Glucose-Capillary 231 (H) 70 - 99 mg/dL   No results found.     Medical Problem List and Plan: 1.  Deficits with mobility, transfers, self-care secondary to left BKA.  Admit to CIR. 2.  Mechanical AVR/Antithrombotics: -DVT/anticoagulation:  Pharmaceutical: Coumadin  -antiplatelet therapy: N/A 3. Pain Management: Still on IV dilaudid for pain--> will plan to d/c and change to oxycodone prn.   Monitor with increased mobility 4. Mood: LCSW to follow for evaluation and support.   -antipsychotic agents: N/A 5. Neuropsych: This patient is capable of making decisions on his own behalf. 6. Skin/Wound Care: Monitor wound for healing.  7. Fluids/Electrolytes/Nutrition: Strict I/Os. Monitor weights daily.   CMP ordered for tomorrow AM. 8. CAD/Systolic CHF: Added HH restrictions to diet. On Lanoxin, Metoprolol and Demadex.   Monitor for signs/symptoms of fluid overload  Daily weights 9. Acute on chronic renal failure: BUN/SCr - 62/1.98 at admission-->76/2.33. May need to  decrease gabapentin dose if SCr continues to worsen.    CMP ordered for tomorrow AM 10 Leucocytosis: Monitor wound, trend and for other signs of infection.   CBC ordered for tomorrow AM 11. Acute on chronic anemia:   CBC ordered for tomorrow AM 12. COPD: Respiratory status stable on Dulera 13. OSA: Has been compliant with CPAP at nights.  14. T2DM with neuropathy: On Lantus and amaryl with BS reasonable. Monitor BS ac/hs  Monitor with increased mobility 15. Anxiety/depression: Managed on Lexapro.   Post Admission Physician Evaluation: 1. Preadmission assessment reviewed and changes made below. 2. Functional deficits secondary  to Left BKA. 3. Patient is admitted to receive collaborative, interdisciplinary care between the physiatrist, rehab nursing staff, and therapy team. 4. Patient's level of medical  complexity and substantial therapy needs in context of that medical necessity cannot be provided at a lesser intensity of care such as a SNF. 5. Patient has experienced substantial functional loss from his/her baseline which was documented above under the "Functional History" and "Functional Status" headings.  Judging by the patient's diagnosis, physical exam, and functional history, the patient has potential for functional progress which will result in measurable gains while on inpatient rehab.  These gains will be of substantial and practical use upon discharge  in facilitating mobility and self-care at the household level. 6. Physiatrist will provide 24 hour management of medical needs as well as oversight of the therapy plan/treatment and provide guidance as appropriate regarding the interaction of the two. 7. 24 hour rehab nursing will assist with bowel management, safety, skin/wound care, disease management, pain management and patient education  and help integrate therapy concepts, techniques,education, etc. 8. PT will assess and treat for/with: Lower extremity strength, range of motion, stamina, balance, functional mobility, safety, adaptive techniques and equipment, wound care, coping skills, pain control, pre-prosthetic education. Goals are: Min A. 9. OT will assess and treat for/with: ADL's,  functional mobility, safety, upper extremity strength, adaptive techniques and equipment, wound mgt, ego support, and community reintegration.   Goals are: Min A. Therapy may not proceed with showering this patient. 10. Case Management and Social Worker will assess and treat for psychological issues and discharge planning. 11. Team conference will be held weekly to assess progress toward goals and to determine barriers to discharge. 12. Patient will receive at least 3 hours of therapy per day at least 5 days per week. 13. ELOS: 13-17 days.       14. Prognosis:  good  I have personally performed a face to  face diagnostic evaluation, including, but not limited to relevant history and physical exam findings, of this patient and developed relevant assessment and plan.  Additionally, I have reviewed and concur with the physician assistant's documentation above.  Delice Lesch, MD, ABPMR Bary Leriche, PA-C 08/05/2019

## 2019-08-05 NOTE — Progress Notes (Signed)
Inpatient Rehab Admissions:  Inpatient Rehab Consult received.  I met with patient at the bedside for rehabilitation assessment and to discuss goals and expectations of an inpatient rehab admission.  I was also able to speak to his wife over the phone.  Pt would need to be at least supervision level for mobility in order for wife to be able to manage, and I think this is feasible.  I will contact attending to see about possibly admitting patient today.   Signed: Shann Medal, PT, DPT Admissions Coordinator [phone] 08/05/19  12:53 PM

## 2019-08-05 NOTE — Plan of Care (Signed)
  Problem: Education: Goal: Knowledge of General Education information will improve Description: Including pain rating scale, medication(s)/side effects and non-pharmacologic comfort measures Outcome: Completed/Met   Problem: Health Behavior/Discharge Planning: Goal: Ability to manage health-related needs will improve Outcome: Completed/Met   Problem: Clinical Measurements: Goal: Ability to maintain clinical measurements within normal limits will improve Outcome: Completed/Met Goal: Will remain free from infection Outcome: Completed/Met Goal: Diagnostic test results will improve Outcome: Completed/Met Goal: Respiratory complications will improve Outcome: Completed/Met Goal: Cardiovascular complication will be avoided Outcome: Completed/Met   Problem: Activity: Goal: Risk for activity intolerance will decrease Outcome: Completed/Met   Problem: Coping: Goal: Level of anxiety will decrease Outcome: Completed/Met   Problem: Elimination: Goal: Will not experience complications related to bowel motility Outcome: Completed/Met Goal: Will not experience complications related to urinary retention Outcome: Completed/Met   Problem: Pain Managment: Goal: General experience of comfort will improve Outcome: Completed/Met   Problem: Safety: Goal: Ability to remain free from injury will improve Outcome: Completed/Met   Problem: Skin Integrity: Goal: Risk for impaired skin integrity will decrease Outcome: Completed/Met   Problem: Education: Goal: Knowledge of the prescribed therapeutic regimen will improve Outcome: Completed/Met Goal: Ability to verbalize activity precautions or restrictions will improve Outcome: Completed/Met Goal: Understanding of discharge needs will improve Outcome: Completed/Met   Problem: Activity: Goal: Ability to perform//tolerate increased activity and mobilize with assistive devices will improve Outcome: Completed/Met   Problem: Clinical  Measurements: Goal: Postoperative complications will be avoided or minimized Outcome: Completed/Met   Problem: Self-Care: Goal: Ability to meet self-care needs will improve Outcome: Completed/Met   Problem: Self-Concept: Goal: Ability to maintain and perform role responsibilities to the fullest extent possible will improve Outcome: Completed/Met   Problem: Pain Management: Goal: Pain level will decrease with appropriate interventions Outcome: Completed/Met   Problem: Skin Integrity: Goal: Demonstration of wound healing without infection will improve Outcome: Completed/Met

## 2019-08-05 NOTE — Progress Notes (Signed)
[facility]  Subjective  - Pain in stump improving.   Objective (!) 126/52 (!) 57 98.5 F (36.9 C) (Oral) 19 97%  Intake/Output Summary (Last 24 hours) at 08/05/2019 1024 Last data filed at 08/05/2019 0600 Gross per 24 hour  Intake 1045.26 ml  Output 4300 ml  Net -3254.74 ml    Left Stump ecchymosis - staples c/d/i.    Assessment/Planning: POD #5 s/p Left BKA.  Some eccymosis that should improve with time.  Will consult biotech for stump stock.  PT/OT CIR ok from our standpoint.   Marty Heck 08/05/2019 10:24 AM --  Laboratory Lab Results: Recent Labs    08/03/19 0729  WBC 10.9*  HGB 9.6*  HCT 30.2*  PLT 225   BMET Recent Labs    08/03/19 0729  NA 131*  K 4.6  CL 97*  CO2 23  GLUCOSE 152*  BUN 76*  CREATININE 2.33*  CALCIUM 8.6*    COAG Lab Results  Component Value Date   INR 1.7 (H) 08/05/2019   INR 1.3 (H) 08/04/2019   INR 1.3 (H) 08/03/2019   No results found for: PTT   Marty Heck, MD [facility] Office: [phone] Pager: [place]

## 2019-08-05 NOTE — Progress Notes (Signed)
Occupational Therapy Treatment Patient Details Name: Joseph Hoffman MRN: [id number] DOB: 1951-08-18 Today's Date: 08/05/2019    History of present illness 68 y.o. male with medical history significant of hypertension, diabetes mellitus type 2, asthma, systolic congestive heart failure with EF of 30 to 35%, coronary artery disease, atrial fibrillation on warfarin, aortic stenosis status post AVR with mechanical valve on Coumadin needing replacement, obstructive sleep apnea on CPAP, chronic kidney disease stage III, depression with anxiety. Presents to hospital from vascular office for follow up of L great toe amputation 07/03/19. Admitted for treatment of infection of surgical site. S/p L BKA 07/31/19.   OT comments  Pt making good progress towards OT goals. Pt participated in EOB grooming for ~ 6 minutes. Pt MOD assist for bed mobility for sequencing of task, LLE management and to scoot hips to EOB. Pt initially hesitant to participate in therapy but very motivated and agreeable to complete grooming EOB  Pt supervision for oral care/ UB bathing EOB. Provided education on importance of desensitization technique for residual limb to assist with pain . Feel pt will do well on CIR. Will continue to follow for acute OT needs.   Follow Up Recommendations  CIR    Equipment Recommendations  3 in 1 bedside commode    Recommendations for Other Services      Precautions / Restrictions Precautions Precautions: Fall Precaution Comments: monitor HR and breathing  Restrictions Weight Bearing Restrictions: Yes LLE Weight Bearing: Non weight bearing Other Position/Activity Restrictions: L BKA       Mobility Bed Mobility Overal bed mobility: Needs Assistance Bed Mobility: Supine to Sit     Supine to sit: Mod assist     General bed mobility comments: MOD A for sequencing of task and to scoot hip to EOB; increased assist to manage residual limb  Transfers                 General transfer  comment: deferred further transfer training this day d/t pain and pt likely getting ready to DC to CIR    Balance Overall balance assessment: Needs assistance Sitting-balance support: Feet supported Sitting balance-Leahy Scale: Fair Sitting balance - Comments: pt able to reach out of BOS to retrieve ADL items from bed side table with no LOB                                   ADL either performed or assessed with clinical judgement   ADL Overall ADL's : Needs assistance/impaired     Grooming: Wash/dry face;Oral care;Sitting;Min guard Grooming Details (indicate cue type and reason): minguard for balance sitting EOB                               General ADL Comments: required minguar  for balance sitting EOB; hesitant to participate initially d/t pain; suspect further standing ADLs would require MAX A     Vision Baseline Vision/History: Wears glasses Wears Glasses: At all times Patient Visual Report: No change from baseline     Perception     Praxis      Cognition Arousal/Alertness: Awake/alert Behavior During Therapy: WFL for tasks assessed/performed Overall Cognitive Status: Within Functional Limits for tasks assessed  General Comments: very motivated and friendly during session; initally hesitant to work with therapy d/t pain but participated well        Exercises     Shoulder Instructions       General Comments      Pertinent Vitals/ Pain       Pain Score: 7  Pain Descriptors / Indicators: Aching;Grimacing;Moaning;Pressure Pain Intervention(s): Monitored during session;Repositioned  Home Living                                          Prior Functioning/Environment              Frequency  Min 2X/week        Progress Toward Goals  OT Goals(current goals can now be found in the care plan section)  Progress towards OT goals: Progressing toward goals  Acute  Rehab OT Goals Time For Goal Achievement: 08/18/19 Potential to Achieve Goals: Good  Plan      Co-evaluation                 AM-PAC OT "6 Clicks" Daily Activity     Outcome Measure   Help from another person eating meals?: A Little Help from another person taking care of personal grooming?: A Little Help from another person toileting, which includes using toliet, bedpan, or urinal?: A Lot Help from another person bathing (including washing, rinsing, drying)?: A Lot Help from another person to put on and taking off regular upper body clothing?: A Little Help from another person to put on and taking off regular lower body clothing?: A Lot 6 Click Score: 15    End of Session    OT Visit Diagnosis: Unsteadiness on feet (R26.81);Other abnormalities of gait and mobility (R26.89);Muscle weakness (generalized) (M62.81);Pain;Other (comment)   Activity Tolerance Patient tolerated treatment well   Patient Left in bed;with call bell/phone within reach   Nurse Communication Mobility status        Time: [id number] OT Time Calculation (min): 24 min  Charges: OT General Charges $OT Visit: 1 Visit OT Treatments $Self Care/Home Management : 23-37 mins     Ihor Gully, COTA/L 08/05/2019, 3:40 PM  [phone]

## 2019-08-05 NOTE — Consult Note (Signed)
   [facility] CM Inpatient Consult   08/05/2019  MAZE SHARAF 1951/02/03 [id number]  Follow up: Active Patient  Patient is transitioning to inpatient CIR s/p amputation for rehab need.  Plan: Will follow progress and needs.  For questions, please contact:  Natividad Brood, RN BSN [facility] Liaison  [phone] business mobile phone Toll free office [phone]  Fax number: [phone] [name]

## 2019-08-05 NOTE — Progress Notes (Signed)
Orthopedic Tech Progress Note Patient Details:  Joseph Hoffman December 19, 1950 [id number] Called in order to [facility] for a BKA retention sock Patient ID: Joseph Hoffman, male   DOB: 01-07-1951, 68 y.o.   MRN: [id number]   [name] 08/05/2019, 9:10 AM

## 2019-08-05 NOTE — Progress Notes (Signed)
Jamse Arn, MD  Physician  Physical Medicine and Rehabilitation  PMR Pre-admission  Signed  Date of Service:  08/05/2019 1:45 PM      Related encounter: Admission (Discharged) from 07/29/2019 in [facility]      Signed         Show:Clear all [x] Manual[x] Template[x] Copied  Added by: [x] Posey Pronto, Domenick Bookbinder, MD[x] Michel Santee, PT  [] Hover for details PMR Admission Coordinator Pre-Admission Assessment  Patient: Joseph Hoffman is an 68 y.o., male MRN: [id number] DOB: [birth year] Height: 5' 11"  (180.3 cm) Weight: 109.8 kg  Insurance Information HMO:     PPO:      PCP:      IPA:      80/20:      OTHER:  PRIMARY: Medicare A and B      Policy#: [id number]      Subscriber: patient CM Name:       Phone#:      Fax#:  Pre-Cert#: verified Civil engineer, contracting:  Benefits:  Phone #:      Name:  Eff. Date: 04/02/2014 for A and B     Deduct: $1408      Out of Pocket Max: n/a      Life Max: n/a CIR: 100%      SNF: 20 full days Outpatient: 80%     Co-Pay: 20% Home Health: 100%      Co-Pay:  DME: 80%     Co-Pay: 20% Providers: pt choice  SECONDARY:       Policy#:       Subscriber:  CM Name:       Phone#:      Fax#:  Pre-Cert#:       Employer:  Benefits:  Phone #:      Name:  Eff. Date:      Deduct:       Out of Pocket Max:       Life Max:  CIR:       SNF:  Outpatient:      Co-Pay:  Home Health:       Co-Pay:  DME:      Co-Pay:   Medicaid Application Date:       Case Manager:  Disability Application Date:       Case Worker:   The "Data Collection Information Summary" for patients in Inpatient Rehabilitation Facilities with attached "Privacy Act [place] Records" was provided and verbally reviewed with: Patient and Family  Emergency Contact Information         Contact Information    Name Relation Home Work Mobile   [place] Spouse [phone]  [phone]   [place], [place] [phone]        Current Medical  History  Patient Admitting Diagnosis: L BKA   History of Present Illness: Joseph Hoffman is a 68 year old male with history of HTN, T2DM, mechanical AVR(leaking)- on coumadin, CAD--pending CABG, systolic CHF, CKD, OSA--CPAP with 2 L oxygen, left great toe amputation 07/03/19 with poor healing and purulent drainage. He was admitted on 07/29/19 with N/V, diarrhea and left foot infection. He was started on IV antibiotics and transitioned to IV heparin. he underwent L-BKA on 07/31/19 by Dr. Carlis Abbott. Post op with lethargy, hypotension, and confusion felt to be secondary to morphine and/or oxycodone, discontinuedas well as steady worsening of renal status. Entersto discontinued,coumadin resumed and stump sock ordered from [employer]. Therapy evaluations completed and patient limited by BKA as well neuropathy. CIR recommended  due to functional decline.    Patient's medical record from [facility] has been reviewed by the rehabilitation admission coordinator and physician.  Past Medical History      Past Medical History:  Diagnosis Date  . Anxiety   . Aortic stenosis   . Arthritis   . Asthma   . Asymptomatic LV dysfunction, EF 45%, normal coronary arteries on cardiac cath 09/18/12 09/30/2012  . CHF (congestive heart failure) ([place])   . Chronic anticoagulation, on Xarelto prior to admit 09/30/2012  . DM (diabetes mellitus) ([place]) 09/30/2012  . Hepatitis B 2003  . Hypertension   . Myocardial infarction ([place])   . Permanent atrial fibrillation, since 1994 09/30/2012   stress test 02/08/12- normal study, no significant ischemia  . S/P AVR (aortic valve replacement), 09/30/2012   a. s/p mechcanical AVR in 09/2012 (on Coumadin)  . Sleep apnea    uses CPAP    Family History   family history includes Diabetes in his father; Heart attack (age of onset: 47) in his brother; Hyperlipidemia (age of onset: 61) in his brother; Hypertension in his mother.  Prior Rehab/Hospitalizations  Has the patient had prior rehab or hospitalizations prior to admission? Yes  Has the patient had major surgery during 100 days prior to admission? Yes             Current Medications  Current Facility-Administered Medications:  .  0.9 %  sodium chloride infusion, 250 mL, Intravenous, PRN, Dagoberto Ligas, PA-C, Last Rate: 10 mL/hr at 08/04/19 2029, 250 mL at 08/04/19 2029 .  0.9 %  sodium chloride infusion, , Intravenous, PRN, Swayze, Ava, DO, Last Rate: 10 mL/hr at 08/01/19 0009, 250 mL at 08/01/19 0009 .  acetaminophen (TYLENOL) tablet 650 mg, 650 mg, Oral, Q6H PRN, 650 mg at 07/31/19 0428 **OR** acetaminophen (TYLENOL) suppository 650 mg, 650 mg, Rectal, Q6H PRN, Eveland, Matthew, PA-C .  albuterol (PROVENTIL) (2.5 MG/3ML) 0.083% nebulizer solution 3 mL, 3 mL, Inhalation, Q6H PRN, Dagoberto Ligas, PA-C .  B-complex with vitamin C tablet 1 tablet, 1 tablet, Oral, Daily, Dagoberto Ligas, PA-C, 1 tablet at 08/05/19 1301 .  cefTRIAXone (ROCEPHIN) 1 g in sodium chloride 0.9 % 100 mL IVPB, 1 g, Intravenous, Q24H, Swayze, Ava, DO, Stopped at 08/04/19 2107 .  cholecalciferol (VITAMIN D3) tablet 1,000 Units, 1,000 Units, Oral, Q breakfast, Dagoberto Ligas, PA-C, 1,000 Units at 08/05/19 3007 .  digoxin (LANOXIN) tablet 0.125 mg, 0.125 mg, Oral, Daily, Dagoberto Ligas, PA-C, 0.125 mg at 08/05/19 0916 .  escitalopram (LEXAPRO) tablet 10 mg, 10 mg, Oral, QHS, Eveland, Matthew, PA-C, 10 mg at 08/04/19 2255 .  ezetimibe (ZETIA) tablet 10 mg, 10 mg, Oral, Daily, Dagoberto Ligas, PA-C, 10 mg at 08/05/19 0917 .  fenofibrate tablet 160 mg, 160 mg, Oral, Q breakfast, Dagoberto Ligas, PA-C, 160 mg at 08/05/19 0916 .  gabapentin (NEURONTIN) capsule 300 mg, 300 mg, Oral, TID, Swayze, Ava, DO, 300 mg at 08/05/19 0917 .  glimepiride (AMARYL) tablet 2 mg, 2 mg, Oral, Q breakfast, Eveland, Matthew, PA-C, 2 mg at 08/05/19 6226 .  heparin ADULT infusion 100 units/mL (25000 units/257m sodium chloride 0.45%),  2,650 Units/hr, Intravenous, Continuous, GLuiz Ochoa RPH, Last Rate: 26.5 mL/hr at 08/05/19 0657, 2,650 Units/hr at 08/05/19 0657 .  HYDROmorphone (DILAUDID) tablet 1 mg, 1 mg, Oral, Q4H PRN, ODana AllanI, MD, 1 mg at 08/05/19 1301 .  insulin aspart (novoLOG) injection 0-20 Units, 0-20 Units, Subcutaneous, TID WC, EDagoberto Ligas PA-C, 7 Units at 08/05/19 1307 .  insulin aspart (novoLOG) injection 0-5 Units, 0-5 Units, Subcutaneous, QHS, Dagoberto Ligas, PA-C, 2 Units at 08/04/19 2255 .  insulin glargine (LANTUS) injection 30 Units, 30 Units, Subcutaneous, BID, Dagoberto Ligas, PA-C, 30 Units at 08/05/19 1308 .  lactated ringers infusion, , Intravenous, Continuous, Dagoberto Ligas, PA-C, Last Rate: 10 mL/hr at 07/31/19 1750 .  metoprolol succinate (TOPROL-XL) 24 hr tablet 25 mg, 25 mg, Oral, BID, Dagoberto Ligas, PA-C, 25 mg at 08/05/19 0917 .  mometasone-formoterol (DULERA) 200-5 MCG/ACT inhaler 2 puff, 2 puff, Inhalation, BID, Dagoberto Ligas, PA-C, 2 puff at 08/05/19 0843 .  nitroGLYCERIN (NITROSTAT) SL tablet 0.4 mg, 0.4 mg, Sublingual, Q5 Min x 3 PRN, Eveland, Matthew, PA-C .  ondansetron (ZOFRAN) tablet 4 mg, 4 mg, Oral, Q6H PRN **OR** ondansetron (ZOFRAN) injection 4 mg, 4 mg, Intravenous, Q6H PRN, Eveland, Matthew, PA-C .  polyethylene glycol (MIRALAX / GLYCOLAX) packet 17 g, 17 g, Oral, Daily PRN, Dagoberto Ligas, PA-C .  saccharomyces boulardii (FLORASTOR) capsule 250 mg, 250 mg, Oral, 3 times weekly, Dagoberto Ligas, PA-C, 250 mg at 08/05/19 0917 .  sodium chloride flush (NS) 0.9 % injection 10-40 mL, 10-40 mL, Intracatheter, PRN, Cameron Proud, Matthew, PA-C .  sodium chloride flush (NS) 0.9 % injection 3 mL, 3 mL, Intravenous, Q12H, Eveland, Matthew, PA-C, 3 mL at 08/02/19 2307 .  sodium chloride flush (NS) 0.9 % injection 3 mL, 3 mL, Intravenous, Q12H, Eveland, Matthew, PA-C, 10 mL at 08/04/19 2027 .  sodium chloride flush (NS) 0.9 % injection 3 mL, 3 mL, Intravenous, PRN,  Dagoberto Ligas, PA-C .  tamsulosin (FLOMAX) capsule 0.4 mg, 0.4 mg, Oral, QPC supper, Swayze, Ava, DO, 0.4 mg at 08/04/19 1709 .  torsemide (DEMADEX) tablet 40 mg, 40 mg, Oral, Daily, 40 mg at 08/05/19 0916 **AND** torsemide (DEMADEX) tablet 20 mg, 20 mg, Oral, Daily, Eveland, Matthew, PA-C, 20 mg at 08/04/19 1709 .  vitamin C (ASCORBIC ACID) tablet 500 mg, 500 mg, Oral, BID, Dagoberto Ligas, PA-C, 500 mg at 08/05/19 0916 .  warfarin (COUMADIN) tablet 15 mg, 15 mg, Oral, ONCE-1800, Swayze, Ava, DO .  Warfarin - Pharmacist Dosing Inpatient, , Does not apply, q1800, Hammons, Theone Murdoch, RPH .  zolpidem (AMBIEN) tablet 5 mg, 5 mg, Oral, QHS PRN, Dagoberto Ligas, PA-C  Patients Current Diet:     Diet Order                  Diet Carb Modified Fluid consistency: Thin; Room service appropriate? Yes  Diet effective now               Precautions / Restrictions Precautions Precautions: Fall Precaution Comments: monitor HR and breathing  Restrictions Weight Bearing Restrictions: Yes LLE Weight Bearing: Non weight bearing   Has the patient had 2 or more falls or a fall with injury in the past year? No  Prior Activity Level Community (5-7x/wk): retired, driving, used to Psychologist, occupational  Prior Functional Level Self Care: Did the patient need help bathing, dressing, using the toilet or eating? Independent  Indoor Mobility: Did the patient need assistance with walking from room to room (with or without device)? Independent  Stairs: Did the patient need assistance with internal or external stairs (with or without device)? Independent  Functional Cognition: Did the patient need help planning regular tasks such as shopping or remembering to take medications? Independent  Home Assistive Devices / Equipment Home Assistive Devices/Equipment: Eyeglasses, Environmental consultant (specify type), Shower chair without back  Prior Device Use: Indicate devices/aids used by the  patient  prior to current illness, exacerbation or injury? None of the above  Current Functional Level Cognition  Overall Cognitive Status: Within Functional Limits for tasks assessed Orientation Level: Oriented X4    Extremity Assessment (includes Sensation/Coordination)  Upper Extremity Assessment: Overall WFL for tasks assessed  Lower Extremity Assessment: LLE deficits/detail LLE Deficits / Details: s/p BKA;limited knee extension;educated pt on mirroring movement with RLE;educated pt on importance of neutralizing hip and extending knee in preparation for prosthetic use in the future;educated pt on desensitization strategies (looking at limb, touching leg) LLE: Unable to fully assess due to pain LLE Sensation: decreased light touch(burning)    ADLs  Overall ADL's : Needs assistance/impaired Eating/Feeding: Set up, Sitting Grooming: Min guard, Minimal assistance, Sitting Grooming Details (indicate cue type and reason): minguard-minA for balance sitting EOB Upper Body Bathing: Set up, Sitting Lower Body Bathing: Maximal assistance, +2 for physical assistance, Sit to/from stand Upper Body Dressing : Minimal assistance, Sitting Lower Body Dressing: Maximal assistance, +2 for physical assistance, Sit to/from stand Toilet Transfer: Maximal assistance, +2 for physical assistance Toilet Transfer Details (indicate cue type and reason): simulated lateral scoot to recliner toward R side Toileting- Clothing Manipulation and Hygiene: Maximal assistance, Sit to/from stand, +2 for physical assistance Functional mobility during ADLs: Maximal assistance, +2 for physical assistance, Rolling walker General ADL Comments: required minguard-minA for balance sitting EOB;minor LOB required assist from therapists to correct;    Mobility  Overal bed mobility: Needs Assistance Bed Mobility: Supine to Sit Supine to sit: Max assist, +2 for physical assistance, HOB elevated General bed mobility comments: assist  to progress BLE off EOB and progress trunk to upright position;    Transfers  Overall transfer level: Needs assistance Equipment used: Rolling walker (2 wheeled) Transfers: Sit to/from Stand, Lateral/Scoot Transfers Sit to Stand: Max assist, +2 physical assistance  Lateral/Scoot Transfers: Max assist, +2 physical assistance General transfer comment: maxA+2 to progress to upright standing;cues to extend through RLE, pelvic control and progress trunk upright, able to tolerate standing for about 10seconds;maxA+2 to lateral scoot to recliner towards R side    Ambulation / Gait / Stairs / Wheelchair Mobility  Ambulation/Gait General Gait Details:    Gait velocity:      Posture / Balance Dynamic Sitting Balance Sitting balance - Comments: reliant on BUE support initially with intermittent no UE support;LOB posteriorly x2 with physical assist from therapist to stabilize; Balance Overall balance assessment: Needs assistance Sitting-balance support: Bilateral upper extremity supported, Feet supported Sitting balance-Leahy Scale: Poor Sitting balance - Comments: reliant on BUE support initially with intermittent no UE support;LOB posteriorly x2 with physical assist from therapist to stabilize; Standing balance support: Bilateral upper extremity supported Standing balance-Leahy Scale: Zero Standing balance comment: maxA+2 for support in standing able to tolerate standing for 10seconds, attempt to stand 2x    Special needs/care consideration BiPAP/CPAP CPAP at night CPM no Continuous Drip IV no Dialysis no        Days n/a Life Vest no Oxygen no Special Bed no Trach Size no Wound Vac (area) no      Location n/a Skin cellulitis to RLE, amputation to LLE                  Bowel mgmt: continent, last BM 8/26 Bladder mgmt: foley for urinary retention Diabetic mgmt: yes Behavioral consideration no Chemo/radiation no   Previous Home Environment (from acute therapy documentation) Living  Arrangements: Spouse/significant other, Children Available Help at Discharge: Family Type of Home: [place]  Layout: Two level, Able to live on main level with bedroom/bathroom Alternate Level Stairs-Number of Steps: flight Home Access: Stairs to enter Technical brewer of Steps: 3 Bathroom Shower/Tub: Tub/shower unit Home Care Services: Yes Type of Home Care Services: Home RN, [place] (if known): [place]  Discharge Living Setting Plans for Discharge Living Setting: Patient's home Type of Home at Discharge: Apartment Discharge Home Layout: Two level, Able to live on main level with bedroom/bathroom Discharge Home Access: Stairs to enter Entrance Stairs-Rails: None Entrance Stairs-Number of Steps: 3 Discharge Bathroom Shower/Tub: Tub/shower unit Discharge Bathroom Toilet: Standard Discharge Bathroom Accessibility: Yes How Accessible: Accessible via walker Does the patient have any problems obtaining your medications?: No  Social/Family/Support Systems Anticipated Caregiver: spouse, [name] Anticipated Ambulance person Information: [phone] Ability/Limitations of Caregiver: supervision only Caregiver Availability: 24/7 Discharge Plan Discussed with Primary Caregiver: Yes Is Caregiver In Agreement with Plan?: Yes Does Caregiver/Family have Issues with Lodging/Transportation while Pt is in Rehab?: No  Goals/Additional Needs Patient/Family Goal for Rehab: PT/OT supervision to mod I Expected length of stay: 18-21 days Dietary Needs: carb modified/thin Pt/Family Agrees to Admission and willing to participate: Yes Program Orientation Provided & Reviewed with Pt/Caregiver Including Roles  & Responsibilities: Yes  Decrease burden of Care through IP rehab admission: n/a  Possible need for SNF placement upon discharge: Possibly.   Patient Condition: I have reviewed medical records from [facility], spoken with CM, and patient and spouse. I  met with patient at the bedside for inpatient rehabilitation assessment.  Patient will benefit from ongoing PT, OT and SLP, can actively participate in 3 hours of therapy a day 5 days of the week, and can make measurable gains during the admission.  Patient will also benefit from the coordinated team approach during an Inpatient Acute Rehabilitation admission.  The patient will receive intensive therapy as well as Rehabilitation physician, nursing, social worker, and care management interventions.  Due to bladder management, safety, skin/wound care, disease management, medication administration, pain management and patient education the patient requires 24 hour a day rehabilitation nursing.  The patient is currently max +2 with mobility and basic ADLs.  Discharge setting and therapy post discharge at home with home health is anticipated.  Patient has agreed to participate in the Acute Inpatient Rehabilitation Program and will admit today.  Preadmission Screen Completed By:  Michel Santee, PT, DPT 08/05/2019 1:45 PM ______________________________________________________________________   Discussed status with Dr. Posey Pronto on .today  at 1:52 PM  and received approval for admission today.  Admission Coordinator:  Michel Santee, PT, DPT, time 1:52 PM Sudie Grumbling 08/05/19    Assessment/Plan: Diagnosis: L BKA   1. Does the need for close, 24 hr/day Medical supervision in concert with the patient's rehab needs make it unreasonable for this patient to be served in a less intensive setting? Yes  2. Co-Morbidities requiring supervision/potential complications: HTN, E2AS, mechanical AVR(leaking)- on coumadin, CAD--pending CABG, systolic CHF, CKD, OSA--CPAP with 2 L oxygen, left great toe amputation 3. Due to bowel management, safety, skin/wound care, disease management, pain management and patient education, does the patient require 24 hr/day rehab nursing? Yes 4. Does the patient require coordinated care of a  physician, rehab nurse, PT (1-2 hrs/day, 5 days/week) and OT (1-2 hrs/day, 5 days/week) to address physical and functional deficits in the context of the above medical diagnosis(es)? Yes Addressing deficits in the following areas: balance, endurance, locomotion, strength, transferring, bathing, dressing, toileting and psychosocial support 5. Can the patient actively  participate in an intensive therapy program of at least 3 hrs of therapy 5 days a week? Yes 6. The potential for patient to make measurable gains while on inpatient rehab is excellent 7. Anticipated functional outcomes upon discharge from inpatients are: Mod I at wheelchair level PT, Mod I at wheelchair level OT, n/a SLP 8. Estimated rehab length of stay to reach the above functional goals is: 16-20 days. 9. Anticipated D/C setting: Home 10. Anticipated post D/C treatments: HH therapy and Home excercise program 11. Overall Rehab/Functional Prognosis: good  MD Signature: Delice Lesch, MD, ABPMR        Revision History

## 2019-08-05 NOTE — Progress Notes (Signed)
ANTICOAGULATION CONSULT NOTE - Follow Up Consult  Pharmacy Consult for Heparin and Warfarin Indication: atrial fibrillation and mechanical AVR  Allergies  Allergen Reactions  . Iohexol Anaphylaxis  . Niacin And Related     Flushing with immediate realese  . Penicillins Other (See Comments)    Unknown.[name]  Did it involve swelling of the face/tongue/throat, SOB, or low BP? Unknown Did it involve sudden or severe rash/hives, skin peeling, or any reaction on the inside of your mouth or nose? Unknown Did you need to seek medical attention at a hospital or doctor's office? Unknown When did it last happen?Childhood If all above answers are "NO", may proceed with cephalosporin use.    Patient Measurements: Height: 5\' 11"  (180.3 cm) Weight: 242 lb 1 oz (109.8 kg) IBW/kg (Calculated) : 75.3 Heparin Dosing Weight: 99.2 kg  Vital Signs: Temp: 98.6 F (37 C) (09/02 0448) Temp Source: Oral (09/02 0448) BP: 119/57 (09/02 0447) Pulse Rate: 68 (09/02 0447)  Labs: Recent Labs    08/03/19 0729 08/04/19 0410 08/05/19 0516  HGB 9.6*  --   --   HCT 30.2*  --   --   PLT 225  --   --   LABPROT 15.7* 16.0* 19.5*  INR 1.3* 1.3* 1.7*  HEPARINUNFRC 0.64 0.63 0.59  CREATININE 2.33*  --   --     Estimated Creatinine Clearance: 37.2 mL/min (A) (by C-G formula based on SCr of 2.33 mg/dL (H)).  Assessment: 68 yo M on warfarin PTA for mechanical AVR + afib.  Warfarin was held for BKA and pt was initiated on heparin bridge.  Patient is now s/p left BKA.    -heparin level at goal, CBC stable  -INR up to 1.7 (s/p 5mg  IV Vit K 8/27)   Warfarin dose PTA = 10 mg daily except 12.5mg  TTS  Goal of Therapy:  INR 2.5-3.5 Heparin level 0.3-0.7 units/ml Monitor platelets by anticoagulation protocol: Yes   Plan:  - Continue heparin infusion at 2650 units/hr - Repeat Coumadin 15 mg po x 1 tonight - Daily INR, heparin level, and CBC   Anette Guarneri, PharmD Clinical  Pharmacist **Pharmacist phone directory can now be found on [email or web address] ([email or web address]).  Listed under [place].

## 2019-08-05 NOTE — Progress Notes (Signed)
Patient ID: Joseph Hoffman, male   DOB: 02-Jun-1951, 68 y.o.   MRN: [id number] Admit to u nit, reviewed medications, therayp schedule and plan of care. States an understanding of information reviewed. Joseph Hoffman

## 2019-08-05 NOTE — Discharge Summary (Addendum)
Physician Discharge Summary  Joseph Hoffman [id number] DOB: [birth year] DOA: 07/29/2019  PCP: Orpah Melter, MD  Admit date: 07/29/2019 Discharge date: 08/05/2019  Recommendations for Outpatient Follow-up:  1. Patient is being discharged to inpatient rehab for PT/OT 2. Follow up with PCP in 7-10 days 3. Follow up with orthopedic surgery as directed. 4. Follow up with orthotics as directed. 5. Follow up with cardiology as directed.  Follow-up Information    [facility], [facility] Follow up.   Specialty: [place] Why: They will continue to do your home health care at your home Contact information: [place] [address] [phone]          Discharge Diagnoses: Principal diagnosis is #1 1. Left foot ulcer at site of previous toe amputation, s/p BKA 2. Mechanical aortic valve. 3. Nausea/Vomiting/Diarrhea 4. Systolic CHF with EF [id number] 5. CKD III 6. CAD 7. OSA on CPAP 8. Hypertension  Discharge Condition: Fair  Disposition: Inpatient rehab  Diet recommendation: Heart healthy with carbohydrate control.  Filed Weights   08/03/19 0323 08/04/19 0435 08/05/19 0448  Weight: 112.2 kg 110.8 kg 109.8 kg    History of present illness:  Joseph Hoffman is a 68 y.o. male with medical history significant of hypertension, diabetes mellitus type 2, asthma, systolic congestive heart failure with EF of 30 to 35%, coronary artery disease, atrial fibrillation on warfarin, aortic stenosis status post AVR with mechanical valve on Coumadin needing replacement, obstructive sleep apnea on CPAP, chronic kidney disease stage III, depression with anxiety who presented to the hospital today having been sent in by Dr. Ainsley Spinner office.  Went into the vascular office today for follow-up after his right amputation of left great toe by Dr. Carlis Abbott on 07/03/2019.  His wound was noted to be erythematous and swollen with purulent drainage.  He was referred to [facility] for direct admission due to his complicated medical history.  Patient will need bridging heparin prior to surgery.  Unfortunately the patient awoke this morning and had some nausea and vomiting with associated diarrhea.  He states that whenever he gets antibiotics he gets diarrhea.  He has not had any diarrhea prior to today.  While I was examining him patient had a momentary episode of fecal urgency which resolved.  Patient is concerned that he will be getting antibiotics and will have diarrhea.  I have ordered stool studies given that he was on vancomycin and Rocephin less than a month ago. He denies chest pain, shortness of breath, cough, fever, sputum production, dysuria, urinary frequency, abdominal pain, palpitations, blurry vision, double vision, unilateral weakness or numbness.  Hospital Course:  The patient was admitted to a telemetry bed. Vascular surgery is consulted. He was started on IV Vancomycin. And has been continued on his home medications as possible.  The patient underwent BKA with Dr. Carlis Abbott on 07/31/2019.  The patient was transiently on Dilaudid PCA pump, but easily became too sleepy. It was discontinued on 08/03/2019.  The patient will be discharged to inpatient rehab today. Orthopedic surgery is making arrangements for orthotic for the pateint.   Today's assessment: S: The patient is awake, alert, and oriented x 3. O: Vitals:  Vitals:   08/05/19 0843 08/05/19 1214  BP:  (!) 126/50  Pulse:  62  Resp:  18  Temp:  99.9 F (37.7 C)  SpO2: 97% 97%    Constitutional:   The patient is awake, alert, and oriented x 3. No acute distress. Respiratory:  No increased work of breathing.  No wheezes, rales, or rhonchi.  No tactile fremitus Cardiovascular:   Regular rate and rhythm  No murmurs, ectopy, or gallups.  No lateral PMI. No thrills. Abdomen:   Abdomen is soft, non-tender, non-distended.  No hernias, masses, or organomegaly  Normoactive  bowel sounds.  Musculoskeletal:   No cyanosis, clubbing, or edema in the right foot.  Left stump is wrapped. Skin:   No rashes, lesions, ulcers  palpation of skin: no induration or nodules Neurologic:   CN 2-12 intact  Sensation all 4 extremities intact Psychiatric:   judgement and insight appear normal  Mental status o Mood, affect appropriate o Orientation to person, place, time   Discharge Instructions  Discharge Instructions    Activity as tolerated - No restrictions   Complete by: As directed    Call MD for:  persistant dizziness or light-headedness   Complete by: As directed    Call MD for:  redness, tenderness, or signs of infection (pain, swelling, redness, odor or green/yellow discharge around incision site)   Complete by: As directed    Call MD for:  temperature >100.4   Complete by: As directed    Diet - low sodium heart healthy   Complete by: As directed    Discharge instructions   Complete by: As directed    PT/OT in rehab. Follow up with PCP in 7-10 days after discharge from rehab. Follow up with Orthopedic surgery as directed. Follow up with orthotics as directed.   Increase activity slowly   Complete by: As directed    Carbohydrate controlled.   warfarin (COUMADIN) per pharmacy consult   Complete by: As directed    For mechanical aortic valve. Goal 2.5 - 3.5.     Allergies as of 08/05/2019      Reactions   Iohexol Anaphylaxis   Niacin And Related    Flushing with immediate realese   Penicillins Other (See Comments)   Unknown.[name] Did it involve swelling of the face/tongue/throat, SOB, or low BP? Unknown Did it involve sudden or severe rash/hives, skin peeling, or any reaction on the inside of your mouth or nose? Unknown Did you need to seek medical attention at a hospital or doctor's office? Unknown When did it last happen?Childhood If all above answers are NO, may proceed with cephalosporin use.        Medication List    STOP taking these medications   b complex vitamins tablet   Chromium 500 MCG Tabs   empagliflozin 25 MG Tabs tablet Commonly known as: Jardiance   enoxaparin 120 MG/0.8ML injection Commonly known as: LOVENOX   glimepiride 2 MG tablet Commonly known as: AMARYL   GLUCOSAMINE 1500 COMPLEX PO   metFORMIN 500 MG tablet Commonly known as: GLUCOPHAGE     TAKE these medications   acetaminophen 325 MG tablet Commonly known as: TYLENOL Take 2 tablets (650 mg total) by mouth every 4 (four) hours as needed for pain or fever. What changed: reasons to take this   albuterol 108 (90 Base) MCG/ACT inhaler Commonly known as: VENTOLIN HFA Inhale 2 puffs into the lungs every 6 (six) hours as needed for wheezing or shortness of breath.   B-complex with vitamin C tablet Take 1 tablet by mouth daily. Start taking on: August 06, 2019   B-D ULTRAFINE III SHORT PEN 31G X 8 MM Misc Generic drug: Insulin Pen Needle   cholecalciferol 1000 units tablet Commonly known as: VITAMIN D Take 1,000 Units  by mouth daily with breakfast.   digoxin 0.125 MG tablet Commonly known as: LANOXIN TAKE 1 TABLET EVERY DAY   Easy Touch Lancets 30G/Twist Misc   escitalopram 10 MG tablet Commonly known as: LEXAPRO Take 10 mg by mouth at bedtime.   ezetimibe 10 MG tablet Commonly known as: ZETIA Take 1 tablet (10 mg total) by mouth daily.   fenofibrate 160 MG tablet Take 1 tablet (160 mg total) by mouth daily. What changed: when to take this   Fluticasone-Salmeterol 250-50 MCG/DOSE Aepb Commonly known as: ADVAIR Inhale 1 puff into the lungs 2 (two) times daily as needed (shortness of breath/asthma related symptoms).   gabapentin 300 MG capsule Commonly known as: NEURONTIN Take 1 capsule (300 mg total) by mouth 3 (three) times daily.   heparin 25000-0.45 UT/250ML-% infusion Inject 2,650 Units/hr into the vein continuous.   Icosapent Ethyl 1 g Caps Commonly known as:  Vascepa Take 2 capsules (2 g total) by mouth 2 (two) times daily. What changed: when to take this   insulin glargine 100 UNIT/ML injection Commonly known as: LANTUS Inject 15-40 Units into the skin See admin instructions. Inject 15 - 40 units subcutaneously daily before breakfast (adjusted per CBG); inject 30-40 units at bedtime ( occasionally adjusted per CBG - usually 35 units)   liraglutide 18 MG/3ML Sopn Commonly known as: VICTOZA Inject 1.8 mg into the skin at bedtime.   metoprolol succinate 25 MG 24 hr tablet Commonly known as: Toprol XL Take 1 tablet (25 mg total) by mouth 2 (two) times daily.   nitroGLYCERIN 0.4 MG SL tablet Commonly known as: NITROSTAT Place 1 tablet (0.4 mg total) under the tongue every 5 (five) minutes as needed for chest pain. What changed: when to take this   oxyCODONE-acetaminophen 10-325 MG tablet Commonly known as: Percocet Take 1 tablet by mouth every 4 (four) hours as needed for pain.   saccharomyces boulardii 250 MG capsule Commonly known as: FLORASTOR Take 250 mg by mouth 3 (three) times a week.   sacubitril-valsartan 24-26 MG Commonly known as: ENTRESTO Take 1 tablet by mouth 2 (two) times daily.   tamsulosin 0.4 MG Caps capsule Commonly known as: FLOMAX Take 1 capsule (0.4 mg total) by mouth daily after supper.   torsemide 20 MG tablet Commonly known as: DEMADEX Take 2 tablets (40 mg total) by mouth every morning. What changed:   how much to take  when to take this  additional instructions   vitamin C 500 MG tablet Commonly known as: ASCORBIC ACID Take 500 mg by mouth 2 (two) times daily.   warfarin 7.5 MG tablet Commonly known as: COUMADIN Take as directed. If you are unsure how to take this medication, talk to your nurse or doctor. Original instructions: Take 2 tablets (15 mg total) by mouth one time only at 6 PM. What changed:   medication strength  how much to take  how to take this  when to take  this  additional instructions  Another medication with the same name was removed. Continue taking this medication, and follow the directions you see here.      Allergies  Allergen Reactions   Iohexol Anaphylaxis   Niacin And Related     Flushing with immediate realese   Penicillins Other (See Comments)    Unknown.[name]  Did it involve swelling of the face/tongue/throat, SOB, or low BP? Unknown Did it involve sudden or severe rash/hives, skin peeling, or any reaction on the inside of your  mouth or nose? Unknown Did you need to seek medical attention at a hospital or doctor's office? Unknown When did it last happen?Childhood If all above answers are NO, may proceed with cephalosporin use.    The results of significant diagnostics from this hospitalization (including imaging, microbiology, ancillary and laboratory) are listed below for reference.    Significant Diagnostic Studies: US Renal  Result Date: 08/03/2019 CLINICAL DATA:  Acute kidney injury. EXAM: RENAL / URINARY TRACT ULTRASOUND COMPLETE COMPARISON:  None. FINDINGS: Right Kidney: Renal measurements: 13.7 x 5.8 x 5.6 cm = volume: 230 mL. Mild hydronephrosis is noted. Echogenicity within normal limits. No mass visualized. Left Kidney: Renal measurements: 13.0 x 6.0 x 5.8 cm = volume: 237 mL. Mild hydronephrosis is noted. Echogenicity within normal limits. No mass visualized. Bladder: Appears normal for degree of bladder distention. Ureteral jets are not visualized. IMPRESSION: Mild bilateral hydronephrosis. Electronically Signed   By: Marijo Conception M.D.   On: 08/03/2019 13:01   Dg Chest Port 1 View  Result Date: 08/03/2019 CLINICAL DATA:  Patient status post left below-the-knee amputation 07/31/2019. EXAM: PORTABLE CHEST 1 VIEW COMPARISON:  Single-view of the chest 06/26/2019 and 10/12/2012. FINDINGS: The patient is status post CABG. There is cardiomegaly without edema. No consolidative process,  pneumothorax or effusion. IMPRESSION: Cardiomegaly without acute disease. Electronically Signed   By: Inge Rise M.D.   On: 08/03/2019 12:29    Microbiology: Recent Results (from the past 240 hour(s))  SARS CORONAVIRUS 2 (TAT 6-12 HRS)     Status: None   Collection Time: 07/29/19  5:55 PM  Result Value Ref Range Status   SARS Coronavirus 2 NEGATIVE NEGATIVE Final    Comment: (NOTE) SARS-CoV-2 target nucleic acids are NOT DETECTED. The SARS-CoV-2 RNA is generally detectable in upper and lower respiratory specimens during the acute phase of infection. Negative results do not preclude SARS-CoV-2 infection, do not rule out co-infections with other pathogens, and should not be used as the sole basis for treatment or other patient management decisions. Negative results must be combined with clinical observations, patient history, and epidemiological information. The expected result is Negative. Fact Sheet for Patients: SugarRoll.be Fact Sheet for Healthcare Providers: https://www.woods-mathews.com/ This test is not yet approved or cleared by the [place] FDA and  has been authorized for detection and/or diagnosis of SARS-CoV-2 by FDA under an Emergency Use Authorization (EUA). This EUA will remain  in effect (meaning this test can be used) for the duration of the COVID-19 declaration under Section 56 4(b)(1) of the Act, 21 U.S.C. section 360bbb-3(b)(1), unless the authorization is terminated or revoked sooner. Performed at [facility], [place] [address], [place], [place] [address]   Culture, blood (routine x 2)     Status: None   Collection Time: 07/30/19  6:33 PM   Specimen: BLOOD LEFT HAND  Result Value Ref Range Status   Specimen Description BLOOD LEFT HAND  Final   Special Requests   Final    BOTTLES DRAWN AEROBIC ONLY Blood Culture adequate volume   Culture   Final    NO GROWTH 5 DAYS Performed at [place], [place] [address], [place], [place] [address]    Report Status 08/04/2019 FINAL  Final  Culture, blood (routine x 2)     Status: None   Collection Time: 07/30/19  6:40 PM   Specimen: BLOOD LEFT HAND  Result Value Ref Range Status   Specimen Description BLOOD LEFT HAND  Final   Special  Requests   Final    BOTTLES DRAWN AEROBIC ONLY Blood Culture adequate volume   Culture   Final    NO GROWTH 5 DAYS Performed at [facility], [place] [address] [place], [place] [address]    Report Status 08/04/2019 FINAL  Final  Surgical pcr screen     Status: None   Collection Time: 07/30/19  9:02 PM   Specimen: Nasal Mucosa; Nasal Swab  Result Value Ref Range Status   MRSA, PCR NEGATIVE NEGATIVE Final   Staphylococcus aureus NEGATIVE NEGATIVE Final    Comment: (NOTE) The Xpert SA Assay (FDA approved for NASAL specimens in patients 78 years of age and older), is one component of a comprehensive surveillance program. It is not intended to diagnose infection nor to guide or monitor treatment. Performed at [facility], [place] [address], [place], [place] [address]      Labs: Basic Metabolic Panel: Recent Labs  Lab 07/29/19 1726 07/30/19 [id number] 07/31/19 0513 08/01/19 0338 08/02/19 0442 08/03/19 0729  NA 133* 135 135 132* 131* 131*  K 5.4* 4.3 3.9 4.3 4.4 4.6  CL 98 99 101 98 98 97*  CO2 21* 24 22 24 22 23   GLUCOSE 234* 182* 150* 310* 217* 152*  BUN 62* 60* 56* 55* 65* 76*  CREATININE 1.98* 1.86* 1.78* 1.83* 2.21* 2.33*  CALCIUM 9.3 9.3 8.7* 8.5* 8.5* 8.6*  MG 2.4  --   --   --   --  2.3  PHOS 3.8  --   --   --   --  4.6   Liver Function Tests: Recent Labs  Lab 07/29/19 1726 08/03/19 0729  AST 34  --   ALT 32  --   ALKPHOS 36*  --   BILITOT 1.1  --   PROT 7.0  --   ALBUMIN 3.6 2.7*   No results for input(s): LIPASE, AMYLASE in the last 168 hours. No results for input(s): AMMONIA in the last 168 hours. CBC: Recent Labs  Lab 07/29/19 1726 07/30/19 0619 07/31/19 0513  08/01/19 0338 08/02/19 0442 08/03/19 0729  WBC 14.3* 9.7 10.4 8.6 10.9* 10.9*  NEUTROABS 12.7*  --   --  7.1  --  7.6  HGB 12.0* 11.4* 11.0* 9.5* 9.7* 9.6*  HCT 38.2* 36.3* 34.3* 29.9* 30.4* 30.2*  MCV 82.3 83.3 82.1 82.6 81.9 82.1  PLT 239 227 209 210 225 225   Cardiac Enzymes: No results for input(s): CKTOTAL, CKMB, CKMBINDEX, TROPONINI in the last 168 hours. BNP: BNP (last 3 results) Recent Labs    05/28/19 0617 06/26/19 1315 08/03/19 1328  BNP 342.8* 669.2* 168.4*    ProBNP (last 3 results) No results for input(s): PROBNP in the last 8760 hours.  CBG: Recent Labs  Lab 08/04/19 1300 08/04/19 1526 08/04/19 2206 08/05/19 0837 08/05/19 1211  GLUCAP 252* 259* 237* 177* 231*    Principal Problem:   Diabetic ulcer of left foot associated with diabetes mellitus due to underlying condition (HCC) Active Problems:   Permanent atrial fibrillation, since 1994   OSA on CPAP   Chronic anticoagulation   Essential hypertension   CRI (chronic renal insufficiency), stage 3 (moderate) (HCC)   Type II diabetes mellitus with renal manifestations (HCC)   CKD (chronic kidney disease), stage III (HCC)   H/O heart valve replacement with mechanical valve   Foot ulcer, left (HCC)   CAD, multiple vessel   PAD (peripheral artery disease) (HCC)   Nausea & vomiting   Diarrhea  Left ventricular ejection fraction of 30% to 35%   Time coordinating discharge: 38 minutes.  Signed:        Rishika Mccollom, DO [facility]  08/05/2019, 1:47 PM

## 2019-08-05 NOTE — PMR Pre-admission (Signed)
PMR Admission Coordinator Pre-Admission Assessment  Patient: Joseph Hoffman is an 68 y.o., male MRN: [id number] DOB: [birth year] Height: 5' 11"  (180.3 cm) Weight: 109.8 kg  Insurance Information HMO:     PPO:      PCP:      IPA:      80/20:      OTHER:  PRIMARY: Medicare A and B      Policy#: [id number]      Subscriber: patient CM Name:       Phone#:      Fax#:  Pre-Cert#: verified Civil engineer, contracting:  Benefits:  Phone #:      Name:  Eff. Date: 04/02/2014 for A and B     Deduct: $1408      Out of Pocket Max: n/a      Life Max: n/a CIR: 100%      SNF: 20 full days Outpatient: 80%     Co-Pay: 20% Home Health: 100%      Co-Pay:  DME: 80%     Co-Pay: 20% Providers: pt choice  SECONDARY:       Policy#:       Subscriber:  CM Name:       Phone#:      Fax#:  Pre-Cert#:       Employer:  Benefits:  Phone #:      Name:  Eff. Date:      Deduct:       Out of Pocket Max:       Life Max:  CIR:       SNF:  Outpatient:      Co-Pay:  Home Health:       Co-Pay:  DME:      Co-Pay:   Medicaid Application Date:       Case Manager:  Disability Application Date:       Case Worker:   The "Data Collection Information Summary" for patients in Inpatient Rehabilitation Facilities with attached "Privacy Act [place] Records" was provided and verbally reviewed with: Patient and Family  Emergency Contact Information Contact Information    Name Relation Home Work Mobile   [place] Spouse [phone]  [phone]   [place], Fleet [phone]        Current Medical History  Patient Admitting Diagnosis: L BKA   History of Present Illness: Joseph Hoffman is a 68 year old male with history of HTN, T2DM, mechanical AVR (leaking)- on coumadin,  CAD--pending CABG, systolic CHF, CKD, OSA--CPAP with 2 L oxygen, left great toe amputation 07/03/19  with poor healing and purulent drainage. He was admitted on 07/29/19 with N/V, diarrhea and left foot infection. He was started on IV antibiotics  and transitioned to IV heparin. he underwent L-BKA on 07/31/19 by Dr. Carlis Abbott. Post op with lethargy, hypotension, and confusion felt to be secondary to morphine and/or oxycodone, discontinued as well as steady worsening of renal status. Entersto discontinued, coumadin resumed and stump sock ordered from [employer]. Therapy evaluations completed and patient limited by BKA as well neuropathy. CIR recommended due to functional decline.     Patient's medical record from [facility] has been reviewed by the rehabilitation admission coordinator and physician.  Past Medical History  Past Medical History:  Diagnosis Date  . Anxiety   . Aortic stenosis   . Arthritis   . Asthma   . Asymptomatic LV dysfunction, EF 45%, normal coronary arteries on cardiac cath 09/18/12 09/30/2012  . CHF (congestive heart failure) ([place])   .  Chronic anticoagulation, on Xarelto prior to admit 09/30/2012  . DM (diabetes mellitus) ([place]) 09/30/2012  . Hepatitis B 2003  . Hypertension   . Myocardial infarction ([place])   . Permanent atrial fibrillation, since 1994 09/30/2012   stress test 02/08/12- normal study, no significant ischemia  . S/P AVR (aortic valve replacement), 09/30/2012   a. s/p mechcanical AVR in 09/2012 (on Coumadin)  . Sleep apnea    uses CPAP    Family History   family history includes Diabetes in his father; Heart attack (age of onset: 48) in his brother; Hyperlipidemia (age of onset: 28) in his brother; Hypertension in his mother.  Prior Rehab/Hospitalizations Has the patient had prior rehab or hospitalizations prior to admission? Yes  Has the patient had major surgery during 100 days prior to admission? Yes   Current Medications  Current Facility-Administered Medications:  .  0.9 %  sodium chloride infusion, 250 mL, Intravenous, PRN, Dagoberto Ligas, PA-C, Last Rate: 10 mL/hr at 08/04/19 2029, 250 mL at 08/04/19 2029 .  0.9 %  sodium chloride infusion, , Intravenous, PRN, Swayze, Ava, DO,  Last Rate: 10 mL/hr at 08/01/19 0009, 250 mL at 08/01/19 0009 .  acetaminophen (TYLENOL) tablet 650 mg, 650 mg, Oral, Q6H PRN, 650 mg at 07/31/19 0428 **OR** acetaminophen (TYLENOL) suppository 650 mg, 650 mg, Rectal, Q6H PRN, Eveland, Matthew, PA-C .  albuterol (PROVENTIL) (2.5 MG/3ML) 0.083% nebulizer solution 3 mL, 3 mL, Inhalation, Q6H PRN, Dagoberto Ligas, PA-C .  B-complex with vitamin C tablet 1 tablet, 1 tablet, Oral, Daily, Dagoberto Ligas, PA-C, 1 tablet at 08/05/19 1301 .  cefTRIAXone (ROCEPHIN) 1 g in sodium chloride 0.9 % 100 mL IVPB, 1 g, Intravenous, Q24H, Swayze, Ava, DO, Stopped at 08/04/19 2107 .  cholecalciferol (VITAMIN D3) tablet 1,000 Units, 1,000 Units, Oral, Q breakfast, Dagoberto Ligas, PA-C, 1,000 Units at 08/05/19 1610 .  digoxin (LANOXIN) tablet 0.125 mg, 0.125 mg, Oral, Daily, Dagoberto Ligas, PA-C, 0.125 mg at 08/05/19 0916 .  escitalopram (LEXAPRO) tablet 10 mg, 10 mg, Oral, QHS, Eveland, Matthew, PA-C, 10 mg at 08/04/19 2255 .  ezetimibe (ZETIA) tablet 10 mg, 10 mg, Oral, Daily, Dagoberto Ligas, PA-C, 10 mg at 08/05/19 0917 .  fenofibrate tablet 160 mg, 160 mg, Oral, Q breakfast, Dagoberto Ligas, PA-C, 160 mg at 08/05/19 0916 .  gabapentin (NEURONTIN) capsule 300 mg, 300 mg, Oral, TID, Swayze, Ava, DO, 300 mg at 08/05/19 0917 .  glimepiride (AMARYL) tablet 2 mg, 2 mg, Oral, Q breakfast, Eveland, Matthew, PA-C, 2 mg at 08/05/19 9604 .  heparin ADULT infusion 100 units/mL (25000 units/260m sodium chloride 0.45%), 2,650 Units/hr, Intravenous, Continuous, GLuiz Ochoa RPH, Last Rate: 26.5 mL/hr at 08/05/19 0657, 2,650 Units/hr at 08/05/19 0657 .  HYDROmorphone (DILAUDID) tablet 1 mg, 1 mg, Oral, Q4H PRN, ODana AllanI, MD, 1 mg at 08/05/19 1301 .  insulin aspart (novoLOG) injection 0-20 Units, 0-20 Units, Subcutaneous, TID WC, EDagoberto Ligas PA-C, 7 Units at 08/05/19 1307 .  insulin aspart (novoLOG) injection 0-5 Units, 0-5 Units, Subcutaneous, QHS,  EDagoberto Ligas PA-C, 2 Units at 08/04/19 2255 .  insulin glargine (LANTUS) injection 30 Units, 30 Units, Subcutaneous, BID, EDagoberto Ligas PA-C, 30 Units at 08/05/19 1308 .  lactated ringers infusion, , Intravenous, Continuous, EDagoberto Ligas PA-C, Last Rate: 10 mL/hr at 07/31/19 1750 .  metoprolol succinate (TOPROL-XL) 24 hr tablet 25 mg, 25 mg, Oral, BID, EDagoberto Ligas PA-C, 25 mg at 08/05/19 0917 .  mometasone-formoterol (DULERA) 200-5 MCG/ACT inhaler 2 puff, 2 puff,  Inhalation, BID, Dagoberto Ligas, PA-C, 2 puff at 08/05/19 [phone] .  nitroGLYCERIN (NITROSTAT) SL tablet 0.4 mg, 0.4 mg, Sublingual, Q5 Min x 3 PRN, Eveland, Matthew, PA-C .  ondansetron (ZOFRAN) tablet 4 mg, 4 mg, Oral, Q6H PRN **OR** ondansetron (ZOFRAN) injection 4 mg, 4 mg, Intravenous, Q6H PRN, Eveland, Matthew, PA-C .  polyethylene glycol (MIRALAX / GLYCOLAX) packet 17 g, 17 g, Oral, Daily PRN, Dagoberto Ligas, PA-C .  saccharomyces boulardii (FLORASTOR) capsule 250 mg, 250 mg, Oral, 3 times weekly, Dagoberto Ligas, PA-C, 250 mg at 08/05/19 0917 .  sodium chloride flush (NS) 0.9 % injection 10-40 mL, 10-40 mL, Intracatheter, PRN, Cameron Proud, Matthew, PA-C .  sodium chloride flush (NS) 0.9 % injection 3 mL, 3 mL, Intravenous, Q12H, Eveland, Matthew, PA-C, 3 mL at 08/02/19 2307 .  sodium chloride flush (NS) 0.9 % injection 3 mL, 3 mL, Intravenous, Q12H, Eveland, Matthew, PA-C, 10 mL at 08/04/19 2027 .  sodium chloride flush (NS) 0.9 % injection 3 mL, 3 mL, Intravenous, PRN, Dagoberto Ligas, PA-C .  tamsulosin (FLOMAX) capsule 0.4 mg, 0.4 mg, Oral, QPC supper, Swayze, Ava, DO, 0.4 mg at 08/04/19 1709 .  torsemide (DEMADEX) tablet 40 mg, 40 mg, Oral, Daily, 40 mg at 08/05/19 0916 **AND** torsemide (DEMADEX) tablet 20 mg, 20 mg, Oral, Daily, Eveland, Matthew, PA-C, 20 mg at 08/04/19 1709 .  vitamin C (ASCORBIC ACID) tablet 500 mg, 500 mg, Oral, BID, Dagoberto Ligas, PA-C, 500 mg at 08/05/19 0916 .  warfarin (COUMADIN)  tablet 15 mg, 15 mg, Oral, ONCE-1800, Swayze, Ava, DO .  Warfarin - Pharmacist Dosing Inpatient, , Does not apply, q1800, Hammons, Theone Murdoch, RPH .  zolpidem (AMBIEN) tablet 5 mg, 5 mg, Oral, QHS PRN, Dagoberto Ligas, PA-C  Patients Current Diet:  Diet Order            Diet Carb Modified Fluid consistency: Thin; Room service appropriate? Yes  Diet effective now              Precautions / Restrictions Precautions Precautions: Fall Precaution Comments: monitor HR and breathing  Restrictions Weight Bearing Restrictions: Yes LLE Weight Bearing: Non weight bearing   Has the patient had 2 or more falls or a fall with injury in the past year? No  Prior Activity Level Community (5-7x/wk): retired, driving, used to Psychologist, occupational  Prior Functional Level Self Care: Did the patient need help bathing, dressing, using the toilet or eating? Independent  Indoor Mobility: Did the patient need assistance with walking from room to room (with or without device)? Independent  Stairs: Did the patient need assistance with internal or external stairs (with or without device)? Independent  Functional Cognition: Did the patient need help planning regular tasks such as shopping or remembering to take medications? Independent  Home Assistive Devices / Equipment Home Assistive Devices/Equipment: Eyeglasses, Environmental consultant (specify type), Shower chair without back  Prior Device Use: Indicate devices/aids used by the patient prior to current illness, exacerbation or injury? None of the above  Current Functional Level Cognition  Overall Cognitive Status: Within Functional Limits for tasks assessed Orientation Level: Oriented X4    Extremity Assessment (includes Sensation/Coordination)  Upper Extremity Assessment: Overall WFL for tasks assessed  Lower Extremity Assessment: LLE deficits/detail LLE Deficits / Details: s/p BKA;limited knee extension;educated pt on mirroring movement with  RLE;educated pt on importance of neutralizing hip and extending knee in preparation for prosthetic use in the future;educated pt on desensitization strategies (looking at limb, touching leg) LLE: Unable to fully assess  due to pain LLE Sensation: decreased light touch(burning)    ADLs  Overall ADL's : Needs assistance/impaired Eating/Feeding: Set up, Sitting Grooming: Min guard, Minimal assistance, Sitting Grooming Details (indicate cue type and reason): minguard-minA for balance sitting EOB Upper Body Bathing: Set up, Sitting Lower Body Bathing: Maximal assistance, +2 for physical assistance, Sit to/from stand Upper Body Dressing : Minimal assistance, Sitting Lower Body Dressing: Maximal assistance, +2 for physical assistance, Sit to/from stand Toilet Transfer: Maximal assistance, +2 for physical assistance Toilet Transfer Details (indicate cue type and reason): simulated lateral scoot to recliner toward R side Toileting- Clothing Manipulation and Hygiene: Maximal assistance, Sit to/from stand, +2 for physical assistance Functional mobility during ADLs: Maximal assistance, +2 for physical assistance, Rolling walker General ADL Comments: required minguard-minA for balance sitting EOB;minor LOB required assist from therapists to correct;    Mobility  Overal bed mobility: Needs Assistance Bed Mobility: Supine to Sit Supine to sit: Max assist, +2 for physical assistance, HOB elevated General bed mobility comments: assist to progress BLE off EOB and progress trunk to upright position;    Transfers  Overall transfer level: Needs assistance Equipment used: Rolling walker (2 wheeled) Transfers: Sit to/from Stand, Lateral/Scoot Transfers Sit to Stand: Max assist, +2 physical assistance  Lateral/Scoot Transfers: Max assist, +2 physical assistance General transfer comment: maxA+2 to progress to upright standing;cues to extend through RLE, pelvic control and progress trunk upright, able to  tolerate standing for about 10seconds;maxA+2 to lateral scoot to recliner towards R side    Ambulation / Gait / Stairs / Wheelchair Mobility  Ambulation/Gait General Gait Details:    Gait velocity:      Posture / Balance Dynamic Sitting Balance Sitting balance - Comments: reliant on BUE support initially with intermittent no UE support;LOB posteriorly x2 with physical assist from therapist to stabilize; Balance Overall balance assessment: Needs assistance Sitting-balance support: Bilateral upper extremity supported, Feet supported Sitting balance-Leahy Scale: Poor Sitting balance - Comments: reliant on BUE support initially with intermittent no UE support;LOB posteriorly x2 with physical assist from therapist to stabilize; Standing balance support: Bilateral upper extremity supported Standing balance-Leahy Scale: Zero Standing balance comment: maxA+2 for support in standing able to tolerate standing for 10seconds, attempt to stand 2x    Special needs/care consideration BiPAP/CPAP CPAP at night CPM no Continuous Drip IV no Dialysis no        Days n/a Life Vest no Oxygen no Special Bed no Trach Size no Wound Vac (area) no      Location n/a Skin cellulitis to RLE, amputation to LLE                  Bowel mgmt: continent, last BM 8/26 Bladder mgmt: foley for urinary retention Diabetic mgmt: yes Behavioral consideration no Chemo/radiation no   Previous Home Environment (from acute therapy documentation) Living Arrangements: Spouse/significant other, Children Available Help at Discharge: Family Type of Home: Apartment Home Layout: Two level, Able to live on main level with bedroom/bathroom Alternate Level Stairs-Number of Steps: flight Home Access: Stairs to enter Technical brewer of Steps: 3 Bathroom Shower/Tub: Tub/shower unit Home Care Services: Yes Type of Home Care Services: Home RN, [place] (if known): [place]  Discharge Living Setting Plans for  Discharge Living Setting: Patient's home Type of Home at Discharge: Apartment Discharge Home Layout: Two level, Able to live on main level with bedroom/bathroom Discharge Home Access: Stairs to enter Entrance Stairs-Rails: None Entrance Stairs-Number of Steps: 3 Discharge Bathroom Shower/Tub: Tub/shower unit  Discharge Bathroom Toilet: Standard Discharge Bathroom Accessibility: Yes How Accessible: Accessible via walker Does the patient have any problems obtaining your medications?: No  Social/Family/Support Systems Anticipated Caregiver: spouse, [name] Anticipated Caregiver's Contact Information: [phone] Ability/Limitations of Caregiver: supervision only Caregiver Availability: 24/7 Discharge Plan Discussed with Primary Caregiver: Yes Is Caregiver In Agreement with Plan?: Yes Does Caregiver/Family have Issues with Lodging/Transportation while Pt is in Rehab?: No  Goals/Additional Needs Patient/Family Goal for Rehab: PT/OT supervision to mod I Expected length of stay: 18-21 days Dietary Needs: carb modified/thin Pt/Family Agrees to Admission and willing to participate: Yes Program Orientation Provided & Reviewed with Pt/Caregiver Including Roles  & Responsibilities: Yes  Decrease burden of Care through IP rehab admission: n/a  Possible need for SNF placement upon discharge: Possibly.   Patient Condition: I have reviewed medical records from [facility], spoken with CM, and patient and spouse. I met with patient at the bedside for inpatient rehabilitation assessment.  Patient will benefit from ongoing PT, OT and SLP, can actively participate in 3 hours of therapy a day 5 days of the week, and can make measurable gains during the admission.  Patient will also benefit from the coordinated team approach during an Inpatient Acute Rehabilitation admission.  The patient will receive intensive therapy as well as Rehabilitation physician, nursing, social worker, and care management  interventions.  Due to bladder management, safety, skin/wound care, disease management, medication administration, pain management and patient education the patient requires 24 hour a day rehabilitation nursing.  The patient is currently max +2 with mobility and basic ADLs.  Discharge setting and therapy post discharge at home with home health is anticipated.  Patient has agreed to participate in the Acute Inpatient Rehabilitation Program and will admit today.  Preadmission Screen Completed By:  Michel Santee, PT, DPT 08/05/2019 1:45 PM ______________________________________________________________________   Discussed status with Dr. Posey Pronto on .today  at 1:52 PM  and received approval for admission today.  Admission Coordinator:  Michel Santee, PT, DPT, time 1:52 PM Sudie Grumbling 08/05/19    Assessment/Plan: Diagnosis: L BKA   1. Does the need for close, 24 hr/day Medical supervision in concert with the patient's rehab needs make it unreasonable for this patient to be served in a less intensive setting? Yes  2. Co-Morbidities requiring supervision/potential complications: HTN, M4WO, mechanical AVR (leaking)- on coumadin,  CAD--pending CABG, systolic CHF, CKD, OSA--CPAP with 2 L oxygen, left great toe amputation 3. Due to bowel management, safety, skin/wound care, disease management, pain management and patient education, does the patient require 24 hr/day rehab nursing? Yes 4. Does the patient require coordinated care of a physician, rehab nurse, PT (1-2 hrs/day, 5 days/week) and OT (1-2 hrs/day, 5 days/week) to address physical and functional deficits in the context of the above medical diagnosis(es)? Yes Addressing deficits in the following areas: balance, endurance, locomotion, strength, transferring, bathing, dressing, toileting and psychosocial support 5. Can the patient actively participate in an intensive therapy program of at least 3 hrs of therapy 5 days a week? Yes 6. The potential for patient  to make measurable gains while on inpatient rehab is excellent 7. Anticipated functional outcomes upon discharge from inpatients are: Mod I at wheelchair level PT, Mod I at wheelchair level OT, n/a SLP 8. Estimated rehab length of stay to reach the above functional goals is: 16-20 days. 9. Anticipated D/C setting: Home 10. Anticipated post D/C treatments: HH therapy and Home excercise program 11. Overall Rehab/Functional Prognosis: good  MD Signature: Marjie Chea Posey Pronto,  MD, ABPMR

## 2019-08-05 NOTE — H&P (Signed)
Physical Medicine and Rehabilitation Admission H&P    CC: functional deficits due to BKA   HPI: Joseph Hoffman is a 68 year old male with history of HTN, T2DM, CAF, mechanical AVR (leaking)- on coumadin,  CAD--pending CABG, systolic CHF, CKD, OSA--CPAP with 2 L oxygen, left great toe amputation 07/03/19  with poor healing and purulent drainage. History taken from chart review and patient. He was admitted on 07/29/2019 with N/V, diarrhea and left foot infection. He was started on IV abx. He underwent L-BKA on  07/31/2019 by Dr. Carlis Abbott. Hospital course complicated by lethargy and confusion, thought to be secondary to narcotics, hypotensions and AKI.  Entersto was discontinued on 08/21. He continues on heparin/Coumadin bridge as INR subtherapeutic and stump sock ordered from [employer]. Therapy evaluations completed and aptient limited by BKA as well neuropathy. CIR recommended due to functional decline. Please also see preadmission assessment from earlier today.     Review of Systems  Constitutional: Negative for chills and fever.  HENT: Negative for hearing loss and tinnitus.   Eyes: Negative for blurred vision and double vision.  Respiratory: Positive for shortness of breath (difficutly climbing stairs).   Cardiovascular: Positive for leg swelling. Negative for chest pain and palpitations.  Gastrointestinal: Positive for constipation. Negative for heartburn and nausea.  Genitourinary: Negative for dysuria and urgency.  Musculoskeletal: Positive for myalgias. Negative for neck pain.  Skin: Negative for itching and rash.  Neurological: Positive for sensory change (decreased in right foot). Negative for focal weakness and weakness.  Psychiatric/Behavioral: Negative for hallucinations. The patient has insomnia.      Past Medical History:  Diagnosis Date   Anxiety    Aortic stenosis    Arthritis    Asthma    Asymptomatic LV dysfunction, EF 45%, normal coronary arteries on cardiac cath  09/18/12 09/30/2012   CHF (congestive heart failure) (HCC)    Chronic anticoagulation, on Xarelto prior to admit 09/30/2012   DM (diabetes mellitus) ([place]) 09/30/2012   Hepatitis 2003   Hypertension    Myocardial infarction [facility])    Permanent atrial fibrillation, since 1994 09/30/2012   stress test 02/08/12- normal study, no significant ischemia   S/P AVR (aortic valve replacement), 09/30/2012   a. s/p mechcanical AVR in 09/2012 (on Coumadin)   Sleep apnea    uses CPAP    Past Surgical History:  Procedure Laterality Date   ABDOMINAL ANGIOGRAM  09/18/2012   Procedure: ABDOMINAL ANGIOGRAM;  Surgeon: Troy Sine, MD;  Location: [facility] CATH LAB;  Service: Cardiovascular;;   ABDOMINAL AORTOGRAM W/LOWER EXTREMITY N/A 07/02/2019   Procedure: ABDOMINAL AORTOGRAM W/LOWER EXTREMITY;  Surgeon: Marty Heck, MD;  Location: [place] CV LAB;  Service: Cardiovascular;  Laterality: N/A;   AMPUTATION Left 07/03/2019   Procedure: AMPUTATION LEFT GREAT TOE;  Surgeon: Marty Heck, MD;  Location: [place];  Service: Vascular;  Laterality: Left;   AMPUTATION Left 07/31/2019   Procedure: AMPUTATION BELOW KNEE LEFT;  Surgeon: Marty Heck, MD;  Location: [place];  Service: Vascular;  Laterality: Left;   AORTIC VALVE REPLACEMENT  09/29/2012   Procedure: AORTIC VALVE REPLACEMENT (AVR);  Surgeon: Gaye Pollack, MD;  Location: [place];  Service: Open Heart Surgery;  Laterality: N/A;   ARCH AORTOGRAM  09/18/2012   Procedure: ARCH AORTOGRAM;  Surgeon: Troy Sine, MD;  Location: [facility] CATH LAB;  Service: Cardiovascular;;   CARDIAC CATHETERIZATION  09/18/12   severe calcific aortic stenosis, peak gradient 2mm, mean gradient 18mm, EF 45%  CARDIAC VALVE REPLACEMENT     AVR 09-29-12   CHOLECYSTECTOMY     FRACTURE SURGERY     LEFT AND RIGHT HEART CATHETERIZATION WITH CORONARY ANGIOGRAM N/A 09/18/2012   Procedure: LEFT AND RIGHT HEART CATHETERIZATION WITH CORONARY ANGIOGRAM;   Surgeon: Troy Sine, MD;  Location: [facility] CATH LAB;  Service: Cardiovascular;  Laterality: N/A;   PERIPHERAL VASCULAR INTERVENTION Left 07/02/2019   Procedure: PERIPHERAL VASCULAR INTERVENTION;  Surgeon: Marty Heck, MD;  Location: [place] CV LAB;  Service: Cardiovascular;  Laterality: Left;   RIGHT HEART CATH AND CORONARY ANGIOGRAPHY N/A 06/29/2019   Procedure: RIGHT HEART CATH AND CORONARY ANGIOGRAPHY;  Surgeon: Nelva Bush, MD;  Location: [facility] CV LAB;  Service: Cardiovascular;  Laterality: N/A;   TEE WITHOUT CARDIOVERSION N/A 05/29/2019   Procedure: TRANSESOPHAGEAL ECHOCARDIOGRAM (TEE);  Surgeon: Lelon Perla, MD;  Location: [facility] ENDOSCOPY;  Service: Cardiovascular;  Laterality: N/A;    Family History  Problem Relation Age of Onset   Hypertension Mother    Diabetes Father    Heart attack Brother 40   Hyperlipidemia Brother 41       stents placed    Social History:   Married. Wife is a retired Education officer, environmental. 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 He reports that he quit smoking about 36 years ago. His smoking use included cigarettes. He has a 2.50 pack-year smoking history. He has quit using smokeless tobacco. He reports he uses a glass of wine/one beer daily. He reports that he does not use drugs.    Allergies  Allergen Reactions   Iohexol Anaphylaxis   Niacin And Related     Flushing with immediate realese   Penicillins Other (See Comments)    Unknown.[name]  Did it involve swelling of the face/tongue/throat, SOB, or low BP? Unknown Did it involve sudden or severe rash/hives, skin peeling, or any reaction on the inside of your mouth or nose? Unknown Did you need to seek medical attention at a hospital or doctor's office? Unknown When did it last happen?Childhood If all above answers are NO, may proceed with cephalosporin use.    Medications Prior to Admission  Medication Sig Dispense Refill   acetaminophen (TYLENOL) 325  MG tablet Take 2 tablets (650 mg total) by mouth every 4 (four) hours as needed for pain or fever. (Patient taking differently: Take 650 mg by mouth every 4 (four) hours as needed for fever or headache (pain). )     albuterol (PROVENTIL HFA;VENTOLIN HFA) 108 (90 BASE) MCG/ACT inhaler Inhale 2 puffs into the lungs every 6 (six) hours as needed for wheezing or shortness of breath.      b complex vitamins tablet Take 1 tablet by mouth daily with breakfast.     cholecalciferol (VITAMIN D) 1000 UNITS tablet Take 1,000 Units by mouth daily with breakfast.      Chromium 500 MCG TABS Take 500 mcg by mouth every evening.     digoxin (LANOXIN) 0.125 MG tablet TAKE 1 TABLET EVERY DAY (Patient taking differently: Take 0.125 mg by mouth daily. ) 90 tablet 1   empagliflozin (JARDIANCE) 25 MG TABS tablet Take 25 mg by mouth daily. 90 tablet 1   escitalopram (LEXAPRO) 10 MG tablet Take 10 mg by mouth at bedtime.      ezetimibe (ZETIA) 10 MG tablet Take 1 tablet (10 mg total) by mouth daily. 90 tablet 3   fenofibrate 160 MG tablet Take 1 tablet (160 mg total) by mouth daily. (Patient  taking differently: Take 160 mg by mouth daily with breakfast. ) 30 tablet 1   Fluticasone-Salmeterol (ADVAIR) 250-50 MCG/DOSE AEPB Inhale 1 puff into the lungs 2 (two) times daily as needed (shortness of breath/asthma related symptoms).      glimepiride (AMARYL) 2 MG tablet Take 2 mg by mouth daily with breakfast.      Glucosamine-Chondroit-Vit C-Mn (GLUCOSAMINE 1500 COMPLEX PO) Take 1 tablet by mouth daily as needed (arthritis flare up).      Icosapent Ethyl (VASCEPA) 1 g CAPS Take 2 capsules (2 g total) by mouth 2 (two) times daily. (Patient taking differently: Take 2 g by mouth 2 (two) times daily with a meal. ) 360 capsule 3   insulin glargine (LANTUS) 100 UNIT/ML injection Inject 15-40 Units into the skin See admin instructions. Inject 15 - 40 units subcutaneously daily before breakfast (adjusted per CBG); inject 30-40  units at bedtime ( occasionally adjusted per CBG - usually 35 units)     liraglutide (VICTOZA) 18 MG/3ML SOPN Inject 1.8 mg into the skin at bedtime.     metFORMIN (GLUCOPHAGE) 500 MG tablet Take 2,000 mg by mouth daily with supper.     metoprolol succinate (TOPROL XL) 25 MG 24 hr tablet Take 1 tablet (25 mg total) by mouth 2 (two) times daily. 90 tablet 1   nitroGLYCERIN (NITROSTAT) 0.4 MG SL tablet Place 1 tablet (0.4 mg total) under the tongue every 5 (five) minutes as needed for chest pain. (Patient taking differently: Place 0.4 mg under the tongue every 5 (five) minutes x 3 doses as needed for chest pain. ) 30 tablet 2   saccharomyces boulardii (FLORASTOR) 250 MG capsule Take 250 mg by mouth 3 (three) times a week.      sacubitril-valsartan (ENTRESTO) 24-26 MG Take 1 tablet by mouth 2 (two) times daily. 60 tablet 3   torsemide (DEMADEX) 20 MG tablet Take 2 tablets (40 mg total) by mouth every morning. (Patient taking differently: Take 20-40 mg by mouth See admin instructions. Take 2 tablets (40 mg) by mouth daily in the morning & take 1 tablet (20 mg) by mouth at night) 60 tablet 3   vitamin C (ASCORBIC ACID) 500 MG tablet Take 500 mg by mouth 2 (two) times daily.     warfarin (COUMADIN) 10 MG tablet Take 10 mg by mouth at bedtime. Take 1 tablet (10 mg) by mouth with 0.5 tablet 5 mg (2.5 mg) on Tuesdays, Thursdays, & Saturdays; ONLY take 1 tablet (10 mg) by mouth on Sundays, Mondays, Wednesdays, & Fridays.     warfarin (COUMADIN) 5 MG tablet Take as instructed with 10 mg. (Patient taking differently: Take 2.5 mg by mouth See admin instructions. Take 0.5 tablet (2.5 mg) by mouth with 1 tablet 10 mg (12.5 mg) on Tuesdays, Thursdays, & Saturdays at bedtime) 90 tablet 1   B-D ULTRAFINE III SHORT PEN 31G X 8 MM MISC      EASY TOUCH LANCETS 30G/TWIST MISC      enoxaparin (LOVENOX) 120 MG/0.8ML injection Inject 0.8 mLs (120 mg total) into the skin 2 (two) times daily for 5 days. (Patient not  taking: Reported on 07/29/2019) 8 mL 0    Drug Regimen Review  Drug regimen was reviewed and remains appropriate with no significant issues identified  Home: Home Living Family/patient expects to be discharged to:: Private residence Living Arrangements: Spouse/significant other, Children Available Help at Discharge: Family Type of Home: Apartment Home Access: Stairs to enter [employer] of Steps: 3 Home Layout:  Two level, Able to live on main level with bedroom/bathroom Alternate Level Stairs-Number of Steps: flight Bathroom Shower/Tub: Tub/shower unit   Functional History: Prior Function Level of Independence: Independent Comments: pt used to be a Academic librarian, independent with all ADL and no AD  Functional Status:  Mobility: Bed Mobility Overal bed mobility: Needs Assistance Bed Mobility: Supine to Sit Supine to sit: Max assist, +2 for physical assistance, HOB elevated General bed mobility comments: assist to progress BLE off EOB and progress trunk to upright position; Transfers Overall transfer level: Needs assistance Equipment used: Rolling walker (2 wheeled) Transfers: Sit to/from Stand, Lateral/Scoot Transfers Sit to Stand: Max assist, +2 physical assistance  Lateral/Scoot Transfers: Max assist, +2 physical assistance General transfer comment: maxA+2 to progress to upright standing;cues to extend through RLE, pelvic control and progress trunk upright, able to tolerate standing for about 10seconds;maxA+2 to lateral scoot to recliner towards R side      ADL: ADL Overall ADL's : Needs assistance/impaired Eating/Feeding: Set up, Sitting Grooming: Min guard, Minimal assistance, Sitting Grooming Details (indicate cue type and reason): minguard-minA for balance sitting EOB Upper Body Bathing: Set up, Sitting Lower Body Bathing: Maximal assistance, +2 for physical assistance, Sit to/from stand Upper Body Dressing : Minimal assistance, Sitting Lower Body  Dressing: Maximal assistance, +2 for physical assistance, Sit to/from stand Toilet Transfer: Maximal assistance, +2 for physical assistance Toilet Transfer Details (indicate cue type and reason): simulated lateral scoot to recliner toward R side Toileting- Clothing Manipulation and Hygiene: Maximal assistance, Sit to/from stand, +2 for physical assistance Functional mobility during ADLs: Maximal assistance, +2 for physical assistance, Rolling walker General ADL Comments: required minguard-minA for balance sitting EOB;minor LOB required assist from therapists to correct;  Cognition: Cognition Overall Cognitive Status: Within Functional Limits for tasks assessed Orientation Level: Oriented X4 Cognition Arousal/Alertness: Awake/alert Behavior During Therapy: WFL for tasks assessed/performed Overall Cognitive Status: Within Functional Limits for tasks assessed   Blood pressure (!) 126/50, pulse 62, temperature 99.9 F (37.7 C), temperature source Oral, resp. rate 18, height 5\' 11"  (1.803 m), weight 109.8 kg, SpO2 97 %. Physical Exam  Nursing note and vitals reviewed. Constitutional: He is oriented to person, place, and time. He appears well-developed. No distress.  Morbidly obese  HENT:  Head: Normocephalic and atraumatic.  Eyes: EOM are normal. Right eye exhibits no discharge. Left eye exhibits no discharge.  Neck: No tracheal deviation present. No thyromegaly present.  Respiratory: Effort normal. No respiratory distress.  GI: He exhibits no distension.  Musculoskeletal:     Comments: Left stump edema and tenderness  Neurological: He is alert and oriented to person, place, and time.  Motor: B/l UE, RLE: 5/5 proximal to distal LLE: HF 3/5 (pain inhibition) Sensation grossly intact to light touch  Skin: He is not diaphoretic.  Left stump with dressing c/d/i  Psychiatric: He has a normal mood and affect. His behavior is normal.  Left foot with small ulcer on 5th toe  L-BKa with two  fluid filled blisters. Ecchymotic area on lateral aspect.    Results for orders placed or performed during the hospital encounter of 07/29/19 (from the past 48 hour(s))  Brain natriuretic peptide     Status: Abnormal   Collection Time: 08/03/19  1:28 PM  Result Value Ref Range   B Natriuretic Peptide 168.4 (H) 0.0 - 100.0 pg/mL    Comment: Performed at [facility], [address] [place], [place] [address]  Glucose, capillary     Status: Abnormal  Collection Time: 08/03/19  5:30 PM  Result Value Ref Range   Glucose-Capillary 284 (H) 70 - 99 mg/dL  Glucose, capillary     Status: Abnormal   Collection Time: 08/03/19  9:29 PM  Result Value Ref Range   Glucose-Capillary 276 (H) 70 - 99 mg/dL   Comment 1 Notify RN    Comment 2 Document in Chart   Urinalysis, Routine w reflex microscopic     Status: Abnormal   Collection Time: 08/03/19 10:25 PM  Result Value Ref Range   Color, Urine YELLOW YELLOW   APPearance CLEAR CLEAR   Specific Gravity, Urine 1.008 1.005 - 1.030   pH 5.0 5.0 - 8.0   Glucose, UA 150 (A) NEGATIVE mg/dL   Hgb urine dipstick NEGATIVE NEGATIVE   Bilirubin Urine NEGATIVE NEGATIVE   Ketones, ur NEGATIVE NEGATIVE mg/dL   Protein, ur NEGATIVE NEGATIVE mg/dL   Nitrite NEGATIVE NEGATIVE   Leukocytes,Ua NEGATIVE NEGATIVE    Comment: Performed at [facility], [place] [address] [place], [place] [address]  Sodium, urine, random     Status: None   Collection Time: 08/03/19 10:25 PM  Result Value Ref Range   Sodium, Ur 40 mmol/L    Comment: Performed at [place] [address], [place], [place] [address]  Heparin level (unfractionated)     Status: None   Collection Time: 08/04/19  4:10 AM  Result Value Ref Range   Heparin Unfractionated 0.63 0.30 - 0.70 IU/mL    Comment: (NOTE) If heparin results are below expected values, and patient dosage has  been confirmed, suggest follow up testing of antithrombin III levels. Performed at [place], [place] [address], [place], [place] [address]   Protime-INR     Status: Abnormal   Collection Time: 08/04/19  4:10 AM  Result Value Ref Range   Prothrombin Time 16.0 (H) 11.4 - 15.2 seconds   INR 1.3 (H) 0.8 - 1.2    Comment: (NOTE) INR goal varies based on device and disease states. Performed at [facility], [place] [address] [place], [place] [address]   Glucose, capillary     Status: Abnormal   Collection Time: 08/04/19  7:54 AM  Result Value Ref Range   Glucose-Capillary 129 (H) 70 - 99 mg/dL  Glucose, capillary     Status: Abnormal   Collection Time: 08/04/19  1:00 PM  Result Value Ref Range   Glucose-Capillary 252 (H) 70 - 99 mg/dL   Comment 1 Notify RN    Comment 2 Document in Chart   Glucose, capillary     Status: Abnormal   Collection Time: 08/04/19  3:26 PM  Result Value Ref Range   Glucose-Capillary 259 (H) 70 - 99 mg/dL   Comment 1 Notify RN    Comment 2 Document in Chart   Glucose, capillary     Status: Abnormal   Collection Time: 08/04/19 10:06 PM  Result Value Ref Range   Glucose-Capillary 237 (H) 70 - 99 mg/dL  Heparin level (unfractionated)     Status: None   Collection Time: 08/05/19  5:16 AM  Result Value Ref Range   Heparin Unfractionated 0.59 0.30 - 0.70 IU/mL    Comment: (NOTE) If heparin results are below expected values, and patient dosage has  been confirmed, suggest follow up testing of antithrombin III levels. Performed at [facility], [place] [address] [place], [place] [address]   Protime-INR     Status: Abnormal  Collection Time: 08/05/19  5:16 AM  Result Value Ref Range   Prothrombin Time 19.5 (H) 11.4 - 15.2 seconds   INR 1.7 (H) 0.8 - 1.2    Comment: (NOTE) INR goal varies based on device and disease states. Performed at [facility], [place] [address] [place], [place] [address]   Glucose, capillary     Status: Abnormal   Collection Time: 08/05/19  8:37 AM  Result Value Ref Range   Glucose-Capillary 177 (H) 70 - 99  mg/dL  Glucose, capillary     Status: Abnormal   Collection Time: 08/05/19 12:11 PM  Result Value Ref Range   Glucose-Capillary 231 (H) 70 - 99 mg/dL   No results found.     Medical Problem List and Plan: 1.  Deficits with mobility, transfers, self-care secondary to left BKA.  Admit to CIR. 2.  Mechanical AVR/Antithrombotics: -DVT/anticoagulation:  Pharmaceutical: Coumadin  -antiplatelet therapy: N/A 3. Pain Management: Still on IV dilaudid for pain--> will plan to d/c and change to oxycodone prn.   Monitor with increased mobility 4. Mood: LCSW to follow for evaluation and support.   -antipsychotic agents: N/A 5. Neuropsych: This patient is capable of making decisions on his own behalf. 6. Skin/Wound Care: Monitor wound for healing.  7. Fluids/Electrolytes/Nutrition: Strict I/Os. Monitor weights daily.   CMP ordered for tomorrow AM. 8. CAD/Systolic CHF: Added HH restrictions to diet. On Lanoxin, Metoprolol and Demadex.   Monitor for signs/symptoms of fluid overload  Daily weights 9. Acute on chronic renal failure: BUN/SCr - 62/1.98 at admission-->76/2.33. May need to  decrease gabapentin dose if SCr continues to worsen.    CMP ordered for tomorrow AM 10 Leucocytosis: Monitor wound, trend and for other signs of infection.   CBC ordered for tomorrow AM 11. Acute on chronic anemia:   CBC ordered for tomorrow AM 12. COPD: Respiratory status stable on Dulera 13. OSA: Has been compliant with CPAP at nights.  14. T2DM with neuropathy: On Lantus and amaryl with BS reasonable. Monitor BS ac/hs  Monitor with increased mobility 15. Anxiety/depression: Managed on Lexapro.   Bary Leriche, PA-C 08/05/2019

## 2019-08-06 ENCOUNTER — Inpatient Hospital Stay (HOSPITAL_COMMUNITY): Payer: Medicare Other | Admitting: Physical Therapy

## 2019-08-06 ENCOUNTER — Inpatient Hospital Stay (HOSPITAL_COMMUNITY): Payer: Medicare Other | Admitting: Occupational Therapy

## 2019-08-06 ENCOUNTER — Inpatient Hospital Stay (HOSPITAL_COMMUNITY): Payer: Medicare Other

## 2019-08-06 DIAGNOSIS — N183 Chronic kidney disease, stage 3 (moderate): Secondary | ICD-10-CM

## 2019-08-06 DIAGNOSIS — S88112A Complete traumatic amputation at level between knee and ankle, left lower leg, initial encounter: Secondary | ICD-10-CM

## 2019-08-06 DIAGNOSIS — Z7901 Long term (current) use of anticoagulants: Secondary | ICD-10-CM

## 2019-08-06 DIAGNOSIS — K5903 Drug induced constipation: Secondary | ICD-10-CM

## 2019-08-06 DIAGNOSIS — G4701 Insomnia due to medical condition: Secondary | ICD-10-CM

## 2019-08-06 LAB — CBC WITH DIFFERENTIAL/PLATELET
Abs Immature Granulocytes: 0.65 10*3/uL — ABNORMAL HIGH (ref 0.00–0.07)
Basophils Absolute: 0.1 10*3/uL (ref 0.0–0.1)
Basophils Relative: 1 %
Eosinophils Absolute: 1.6 10*3/uL — ABNORMAL HIGH (ref 0.0–0.5)
Eosinophils Relative: 13 %
HCT: 29 % — ABNORMAL LOW (ref 39.0–52.0)
Hemoglobin: 9.4 g/dL — ABNORMAL LOW (ref 13.0–17.0)
Immature Granulocytes: 5 %
Lymphocytes Relative: 9 %
Lymphs Abs: 1.1 10*3/uL (ref 0.7–4.0)
MCH: 26.6 pg (ref 26.0–34.0)
MCHC: 32.4 g/dL (ref 30.0–36.0)
MCV: 82.2 fL (ref 80.0–100.0)
Monocytes Absolute: 1.1 10*3/uL — ABNORMAL HIGH (ref 0.1–1.0)
Monocytes Relative: 9 %
Neutro Abs: 7.5 10*3/uL (ref 1.7–7.7)
Neutrophils Relative %: 63 %
Platelets: 237 10*3/uL (ref 150–400)
RBC: 3.53 MIL/uL — ABNORMAL LOW (ref 4.22–5.81)
RDW: 18.1 % — ABNORMAL HIGH (ref 11.5–15.5)
WBC: 12 10*3/uL — ABNORMAL HIGH (ref 4.0–10.5)
nRBC: 0 % (ref 0.0–0.2)

## 2019-08-06 LAB — HEPARIN LEVEL (UNFRACTIONATED)
Heparin Unfractionated: 0.84 IU/mL — ABNORMAL HIGH (ref 0.30–0.70)
Heparin Unfractionated: 0.75 IU/mL — ABNORMAL HIGH (ref 0.30–0.70)
Heparin Unfractionated: 0.51 IU/mL (ref 0.30–0.70)

## 2019-08-06 LAB — URINALYSIS, ROUTINE W REFLEX MICROSCOPIC
Bilirubin Urine: NEGATIVE
Glucose, UA: NEGATIVE mg/dL
Ketones, ur: NEGATIVE mg/dL
Leukocytes,Ua: NEGATIVE
Nitrite: NEGATIVE
Protein, ur: NEGATIVE mg/dL
RBC / HPF: >50 RBC/hpf — ABNORMAL HIGH (ref 0–5)
Specific Gravity, Urine: 1.006 (ref 1.005–1.030)
pH: 8 (ref 5.0–8.0)

## 2019-08-06 LAB — COMPREHENSIVE METABOLIC PANEL
ALT: 44 U/L (ref 0–44)
AST: 49 U/L — ABNORMAL HIGH (ref 15–41)
Albumin: 2.5 g/dL — ABNORMAL LOW (ref 3.5–5.0)
Alkaline Phosphatase: 44 U/L (ref 38–126)
Anion gap: 9 (ref 5–15)
BUN: 59 mg/dL — ABNORMAL HIGH (ref 8–23)
CO2: 27 mmol/L (ref 22–32)
Calcium: 8.9 mg/dL (ref 8.9–10.3)
Chloride: 100 mmol/L (ref 98–111)
Creatinine, Ser: 1.56 mg/dL — ABNORMAL HIGH (ref 0.61–1.24)
GFR calc Af Amer: 51 mL/min — ABNORMAL LOW (ref >60)
GFR calc non Af Amer: 44 mL/min — ABNORMAL LOW (ref >60)
Glucose, Bld: 128 mg/dL — ABNORMAL HIGH (ref 70–99)
Potassium: 4.1 mmol/L (ref 3.5–5.1)
Sodium: 136 mmol/L (ref 135–145)
Total Bilirubin: 0.6 mg/dL (ref 0.3–1.2)
Total Protein: 5.9 g/dL — ABNORMAL LOW (ref 6.5–8.1)

## 2019-08-06 LAB — GLUCOSE, CAPILLARY
Glucose-Capillary: 119 mg/dL — ABNORMAL HIGH (ref 70–99)
Glucose-Capillary: 178 mg/dL — ABNORMAL HIGH (ref 70–99)
Glucose-Capillary: 263 mg/dL — ABNORMAL HIGH (ref 70–99)
Glucose-Capillary: 246 mg/dL — ABNORMAL HIGH (ref 70–99)

## 2019-08-06 LAB — PROTIME-INR
INR: 2.2 — ABNORMAL HIGH (ref 0.8–1.2)
Prothrombin Time: 23.7 seconds — ABNORMAL HIGH (ref 11.4–15.2)

## 2019-08-06 MED ORDER — BISACODYL 10 MG RE SUPP
10.0000 mg | Freq: Once | RECTAL | Status: DC
  Filled 2019-08-06 (×2): qty 1

## 2019-08-06 MED ORDER — WARFARIN SODIUM 7.5 MG PO TABS
7.5000 mg | ORAL_TABLET | Freq: Once | ORAL | Status: AC
  Administered 2019-08-06: 19:00:00 7.5 mg via ORAL
  Filled 2019-08-06: qty 1

## 2019-08-06 MED ORDER — POLYETHYLENE GLYCOL 3350 17 G PO PACK
17.0000 g | PACK | Freq: Every day | ORAL | Status: DC
  Administered 2019-08-07 – 2019-08-12 (×6): 17 g via ORAL
  Filled 2019-08-06 (×6): qty 1

## 2019-08-06 MED ORDER — LIDOCAINE HCL URETHRAL/MUCOSAL 2 % EX GEL
CUTANEOUS | Status: DC | PRN
  Administered 2019-08-07 – 2019-08-08 (×2): 1 via TOPICAL
  Filled 2019-08-06 (×4): qty 5

## 2019-08-06 MED ORDER — MAGNESIUM CITRATE PO SOLN
1.0000 | Freq: Once | ORAL | Status: DC
  Filled 2019-08-06: qty 296

## 2019-08-06 NOTE — Progress Notes (Signed)
[facility] for Heparin and Warfarin Indication: atrial fibrillation and mechanical AVR  Allergies  Allergen Reactions  . Iohexol Anaphylaxis  . Niacin And Related     Flushing with immediate realese  . Penicillins Other (See Comments)    Unknown.[name]  Did it involve swelling of the face/tongue/throat, SOB, or low BP? Unknown Did it involve sudden or severe rash/hives, skin peeling, or any reaction on the inside of your mouth or nose? Unknown Did you need to seek medical attention at a hospital or doctor's office? Unknown When did it last happen?Childhood If all above answers are "NO", may proceed with cephalosporin use.    Patient Measurements: Height: 5\' 11"  (180.3 cm) Weight: 254 lb 3.1 oz (115.3 kg) IBW/kg (Calculated) : 75.3 Heparin Dosing Weight: 99.2 kg  Vital Signs: Temp: 97.8 F (36.6 C) (09/03 1935) Temp Source: Oral (09/03 1935) BP: 120/51 (09/03 1935) Pulse Rate: 63 (09/03 1935)  Labs: Recent Labs    08/04/19 0410 08/05/19 0516 08/06/19 0529 08/06/19 1216 08/06/19 2138  HGB  --   --  9.4*  --   --   HCT  --   --  29.0*  --   --   PLT  --   --  237  --   --   LABPROT 16.0* 19.5*  --  23.7*  --   INR 1.3* 1.7*  --  2.2*  --   HEPARINUNFRC 0.63 0.59 0.84* 0.75* 0.51  CREATININE  --   --  1.56*  --   --     Estimated Creatinine Clearance: 56.9 mL/min (A) (by C-G formula based on SCr of 1.56 mg/dL (H)).  Assessment: 68 yo M on warfarin PTA for mechanical AVR + afib.  Warfarin was held for BKA and pt was initiated on heparin bridge.  Patient is now s/p left BKA. Heparin level remains elevated despite rate reductions. INR is now trending up fairly quickly but not yet therapeutic.   Warfarin dose PTA = 10 mg daily except 12.5mg  TTS  Heparin level at goal (0.51) on 2300 units/hr, expect INR to be at goal tomorrow morning.   Goal of Therapy:  INR 2.5-3.5 Heparin level 0.3-0.7 units/ml Monitor  platelets by anticoagulation protocol: Yes   Plan:  Heparin gtt at 2300 units/hr Daily INR, heparin level and CBC  Erin Hearing PharmD., BCPS Clinical Pharmacist 08/06/2019 10:27 PM

## 2019-08-06 NOTE — Progress Notes (Signed)
Occupational Therapy Session Note  Patient Details  Name: Joseph Hoffman MRN: [id number] Date of Birth: 13-Mar-1951  Today's Date: 08/06/2019 OT Individual Time: 1300-1345 OT Individual Time Calculation (min): 45 min    Short Term Goals: Week 1:  OT Short Term Goal 1 (Week 1): Pt will be able to transfer to [facility] and or toilet with min A using LRAD. OT Short Term Goal 2 (Week 1): Pt will be able to bathe LB with S OT Short Term Goal 3 (Week 1): Pt will be able to dress LB with min A. OT Short Term Goal 4 (Week 1): Pt will be able to maintain static stand for 1 minute with min A to pull clothing over hips.  Skilled Therapeutic Interventions/Progress Updates:  Pt resting in bed upon arrival.  Pt stated his LLE was very painful (10/10).  Gentle massage provided for desensitization. OT focus on bed mobility, discharge planning, activity tolerance, and safety awareness to increase independence with BADLs. Pt able to roll R<>L for LB clothing management but required assistance with doffing pants to use urinal.  Pt unable to void.  Pt has walk in shower at home which will accommodate TTB. Pt's kitchen is w/c accessible. Attempted to sit EOB but LLE pain prevented completion.  Pt remained in bed with all needs within reach and lunch tray positioned on bedside table. Bed alarm activated.  Therapy Documentation Precautions:  Precautions Precautions: Fall Precaution Comments: monitor HR and breathing  Restrictions Weight Bearing Restrictions: Yes LLE Weight Bearing: Non weight bearing Other Position/Activity Restrictions: L BKA Pain: Pt c/o 10/10 pain LLE; repositioned and relaxation  Therapy/Group: Individual Therapy  [name] 08/06/2019, 2:44 PM

## 2019-08-06 NOTE — Care Management Note (Addendum)
[place] Individual Statement of Services  Patient Name:  Joseph Hoffman  Date:  08/06/2019  Welcome to the [facility].  Our goal is to provide you with an individualized program based on your diagnosis and situation, designed to meet your specific needs.  With this comprehensive rehabilitation program, you will be expected to participate in at least 3 hours of rehabilitation therapies Monday-Friday, with modified therapy programming on the weekends.  Your rehabilitation program will include the following services:  Physical Therapy (PT), Occupational Therapy (OT), 24 hour per day rehabilitation nursing, Therapeutic Recreaction (TR), Case Management (Social Worker), Rehabilitation Medicine, Nutrition Services and Pharmacy Services  Weekly team conferences will be held on Wednesday to discuss your progress.  Your Social Worker will talk with you frequently to get your input and to update you on team discussions.  Team conferences with you and your family in attendance may also be held.  Expected length of stay: 14-18 days  Overall anticipated outcome: supervision with cueing-10 ft gait and independent with transfers  Depending on your progress and recovery, your program may change. Your Social Worker will coordinate services and will keep you informed of any changes. Your Social Worker's name and contact numbers are listed  below.  The following services may also be recommended but are not provided by the [facility] will be made to provide these services after discharge if needed.  Arrangements include referral to agencies that provide these services.  Your insurance has been verified to be:  Medicare & [employer] Your primary doctor is:  Orpah Melter  Pertinent information will be shared with your doctor and your  insurance company.  Social Worker:  Ovidio Kin, [place] or ([phone]  Information discussed with and copy given to patient by: Elease Hashimoto, 08/06/2019, 9:42 AM

## 2019-08-06 NOTE — Progress Notes (Signed)
Inpatient Rehabilitation  Patient information reviewed and entered into eRehab system by Naylin Burkle M. Daishaun Ayre, M.A., CCC/SLP, PPS Coordinator.  Information including medical coding, functional ability and quality indicators will be reviewed and updated through discharge.    

## 2019-08-06 NOTE — Evaluation (Signed)
Occupational Therapy Assessment and Plan  Patient Details  Name: Joseph Hoffman MRN: [id number] Date of Birth: 19-Feb-1951  OT Diagnosis: acute pain and muscle weakness (generalized) Rehab Potential: Rehab Potential (ACUTE ONLY): Excellent ELOS: 14-17 days   Today's Date: 08/06/2019 OT Individual Time: 6294-7654 OT Individual Time Calculation (min): 80 min     Problem List:  Patient Active Problem List   Diagnosis Date Noted  . Unilateral complete BKA, left, initial encounter ([place]) 08/05/2019  . Leukocytosis   . Acute on chronic anemia   . Post-operative pain   . Nausea & vomiting 07/29/2019  . Diarrhea 07/29/2019  . Diabetic ulcer of left foot associated with diabetes mellitus due to underlying condition ([place]) 07/29/2019  . Left ventricular ejection fraction of 30% to 35% 07/29/2019  . PAD (peripheral artery disease) ([place]) 07/28/2019  . Long term (current) use of anticoagulants 07/13/2019  . CAD, multiple vessel 07/10/2019  . Aortic valve regurgitation   . Aortic valve disease   . Foot ulcer, left ([place]) 06/26/2019  . Depression 05/28/2019  . Congestive heart failure (CHF) ([place]) 05/13/2019  . H/O heart valve replacement with mechanical valve   . Type II diabetes mellitus with renal manifestations ([place]) 05/12/2019  . Chest pain 05/12/2019  . Elevated troponin 05/12/2019  . CKD (chronic kidney disease), stage III ([place]) 05/12/2019  . Acute on chronic systolic (congestive) heart failure ([place]) 05/12/2019  . CRI (chronic renal insufficiency), stage 3 (moderate) ([place]) 04/09/2019  . Acute combined systolic and diastolic heart failure ([place]) 04/09/2019  . Essential hypertension 06/03/2017  . Dyslipidemia, goal LDL below 70 04/11/2017  . AS (aortic stenosis) 10/28/2012  . Acute on chronic systolic heart failure, re-admitted 10/12/12 10/12/2012  . S/P AVR, 09/29/12, [employer] [facility]. (discharged 10/05/12) 09/30/2012  . Permanent atrial fibrillation, since 1994 09/30/2012  . Type 2 IDDM  09/30/2012  . OSA on CPAP 09/30/2012  . Normal coronary arteries at cath Oct 2013 09/30/2012  . Chronic anticoagulation 09/30/2012  . Severe aortic stenosis 09/25/2012    Past Medical History:  Past Medical History:  Diagnosis Date  . Anxiety   . Aortic stenosis   . Arthritis   . Asthma   . Asymptomatic LV dysfunction, EF 45%, normal coronary arteries on cardiac cath 09/18/12 09/30/2012  . CHF (congestive heart failure) ([place])   . Chronic anticoagulation, on Xarelto prior to admit 09/30/2012  . DM (diabetes mellitus) ([place]) 09/30/2012  . Hepatitis B 2003  . Hypertension   . Myocardial infarction ([place])   . Permanent atrial fibrillation, since 1994 09/30/2012   stress test 02/08/12- normal study, no significant ischemia  . S/P AVR (aortic valve replacement), 09/30/2012   a. s/p mechcanical AVR in 09/2012 (on Coumadin)  . Sleep apnea    uses CPAP   Past Surgical History:  Past Surgical History:  Procedure Laterality Date  . ABDOMINAL ANGIOGRAM  09/18/2012   Procedure: ABDOMINAL ANGIOGRAM;  Surgeon: Troy Sine, MD;  Location[facility] CATH LAB;  Service: Cardiovascular;;  . ABDOMINAL AORTOGRAM W/LOWER EXTREMITY N/A 07/02/2019   Procedure: ABDOMINAL AORTOGRAM W/LOWER EXTREMITY;  Surgeon: Marty Heck, MD;  Location: [facility] CV LAB;  Service: Cardiovascular;  Laterality: N/A;  . AMPUTATION Left 07/03/2019   Procedure: AMPUTATION LEFT GREAT TOE;  Surgeon: Marty Heck, MD;  Location: [facility];  Service: Vascular;  Laterality: Left;  . AMPUTATION Left 07/31/2019   Procedure: AMPUTATION BELOW KNEE LEFT;  Surgeon: Marty Heck, MD;  Location: [facility];  Service: Vascular;  Laterality:  Left;  . AORTIC VALVE REPLACEMENT  09/29/2012   Procedure: AORTIC VALVE REPLACEMENT (AVR);  Surgeon: Gaye Pollack, MD;  Location: [facility];  Service: Open Heart Surgery;  Laterality: N/A;  . ARCH AORTOGRAM  09/18/2012   Procedure: ARCH AORTOGRAM;  Surgeon: Troy Sine, MD;  Location: [facility]  CATH LAB;  Service: Cardiovascular;;  . CARDIAC CATHETERIZATION  09/18/12   severe calcific aortic stenosis, peak gradient 82m, mean gradient 553m EF 45%  . CARDIAC VALVE REPLACEMENT     AVR 09-29-12  . CHOLECYSTECTOMY    . FRACTURE SURGERY    . LEFT AND RIGHT HEART CATHETERIZATION WITH CORONARY ANGIOGRAM N/A 09/18/2012   Procedure: LEFT AND RIGHT HEART CATHETERIZATION WITH CORONARY ANGIOGRAM;  Surgeon: ThTroy SineMD;  [facility];  Service: Cardiovascular;  Laterality: N/A;  . PERIPHERAL VASCULAR INTERVENTION Left 07/02/2019   Procedure: PERIPHERAL VASCULAR INTERVENTION;  Surgeon: ClMarty HeckMD;  [facility];  Service: Cardiovascular;  Laterality: Left;  . RIGHT HEART CATH AND CORONARY ANGIOGRAPHY N/A 06/29/2019   Procedure: RIGHT HEART CATH AND CORONARY ANGIOGRAPHY;  Surgeon: EnNelva BushMD;  [facility];  Service: Cardiovascular;  Laterality: N/A;  . TEE WITHOUT CARDIOVERSION N/A 05/29/2019   Procedure: TRANSESOPHAGEAL ECHOCARDIOGRAM (TEE);  Surgeon: CrLelon PerlaMD;  [facility];  Service: Cardiovascular;  Laterality: N/A;    Assessment & Plan Clinical Impression:  Joseph WARWICKs a [age]ear old male with history of HTN, T2DM, CAF, mechanical AVR (leaking)- on coumadin, CAD--pending CABG, systolic CHF, CKD, OSA--CPAP with 2 L oxygen, left great toe amputation 07/03/19 with poor healing and purulent drainage. History taken from chart review and patient. He was admitted on 07/29/2019 with N/V, diarrhea and left foot infection. He was started on IV abx. He underwent L-BKA on 07/31/2019 by Dr. ClCarlis AbbottHospital course complicated by lethargy and confusion, thought to be secondary to narcotics, hypotensions and AKI. Entersto was discontinued on 08/21. He continues on heparin/Coumadin bridge as INR subtherapeutic and stump sock ordered from [employer] evaluations completed and aptient limited by BKA as well  neuropathy. CIR recommended due to functional decline. Please also see preadmission assessment from earlier today.  .  Patient transferred to CIR on 08/05/2019 .    Patient currently requires max with basic self-care skills secondary to muscle weakness, decreased cardiorespiratoy endurance and decreased standing balance and decreased balance strategies.  Prior to hospitalization, patient could was fully independent,  Patient will benefit from skilled intervention to increase independence with basic self-care skills prior to discharge home with care partner.  Anticipate patient will require intermittent supervision and follow up home health.  OT - End of Session Activity Tolerance: Tolerates 10 - 20 min activity with multiple rests Endurance Deficit: Yes Endurance Deficit Description: limited by pain OT Assessment Rehab Potential (ACUTE ONLY): Excellent OT Barriers to Discharge: Decreased caregiver support OT Barriers to Discharge Comments: wife is a COTA but has physical limitations OT Patient demonstrates impairments in the following area(s): Balance;Endurance;Motor;Pain OT Basic ADL's Functional Problem(s): Bathing;Dressing;Toileting OT Transfers Functional Problem(s): Toilet;Tub/Shower OT Additional Impairment(s): None OT Plan OT Intensity: Minimum of 1-2 x/day, 45 to 90 minutes OT Frequency: 5 out of 7 days OT Duration/Estimated Length of Stay: 14-17 days OT Treatment/Interventions: Balance/vestibular training;Discharge planning;Disease mangement/prevention;Pain management;Functional mobility training;DME/adaptive equipment instruction;Patient/family education;Psychosocial support;Self Care/advanced ADL retraining;Skin care/wound managment;UE/LE Strength taining/ROM;Therapeutic Exercise;Therapeutic Activities OT Self Feeding Anticipated Outcome(s): no goal, pt is I OT Basic Self-Care Anticipated Outcome(s): Mod  I OT Toileting Anticipated Outcome(s): Mod I OT Bathroom Transfers  Anticipated Outcome(s): Mod I to toilet, S to shower OT Recommendation Patient destination: Home Follow Up Recommendations: Home health OT Equipment Recommended: 3 in 1 bedside comode;Tub/shower bench   Skilled Therapeutic Intervention Pt seen for initial evaluation and ADL training. Reviewed role of OT, POC, goals, ELOS.  Pt was in no pain at rest in supine, sat to EOB and with leg in dependent position pain became an 8.  Completed b/d from EOB with mod-max for LB self care.  Slide board to w/c with mod A.  Provided pt with amputee leg pad with pillow support. His pain progressed to 10/10.  RN provided medication.   Pt completed grooming from wc level.  Pt resting in w/c with chair pad alarm on and resting prior to PT.  OT Evaluation Precautions/Restrictions  Precautions Precautions: Fall Precaution Comments: monitor HR and breathing  Restrictions Weight Bearing Restrictions: Yes LLE Weight Bearing: Non weight bearing Other Position/Activity Restrictions: L BKA    Vital Signs Oxygen Therapy O2 Device: Room Air Pain Pain Assessment Pain Scale: 0-10 Pain Score: 8  Pain Type: Surgical pain Pain Location: Leg Pain Orientation: Left Pain Descriptors / Indicators: Aching;Throbbing Pain Onset: On-going Pain Intervention(s): Repositioned;Rest;Relaxation Home Living/Prior Functioning Home Living Living Arrangements: Spouse/significant other, Children Available Help at Discharge: Available 24 hours/day, Family(wife home 24/7 but has her own medical issues, son lives on pt's 2nd floor, son able to help w/ meals and chores PRN) Type of Home: House Home Access: Stairs to enter [employer] of Steps: 1 small step and a threshold to get into house, pt has been negotiating backwards w/ RW since L toe amputation Home Layout: Two level, Able to live on main level with bedroom/bathroom Bathroom Shower/Tub: Multimedia programmer: Standard  Lives With: Spouse(spouse with  medical issues, uses cane but home 24/7) Prior Function Level of Independence: Independent with basic ADLs, Independent with homemaking with ambulation, Independent with gait, Independent with transfers(prior to initial toe amputation in LLE of July 2020, using RW since then)  Able to Take Stairs?: Yes(prior to 1st amputation in July 2020) Driving: Yes(prior to 1st amputation in July 2020) Vocation: Retired Comments: pt used to be a Academic librarian, independent with all ADL and no AD ADL ADL Eating: Independent Grooming: Setup Upper Body Bathing: Setup Where Assessed-Upper Body Bathing: Edge of bed Lower Body Bathing: Moderate assistance Where Assessed-Lower Body Bathing: Edge of bed Upper Body Dressing: Setup Where Assessed-Upper Body Dressing: Edge of bed Lower Body Dressing: Maximal assistance Where Assessed-Lower Body Dressing: Edge of bed Vision Baseline Vision/History: No visual deficits Vision Assessment?: No apparent visual deficits Perception  Perception: Within Functional Limits Praxis Praxis: Intact Cognition Overall Cognitive Status: Within Functional Limits for tasks assessed Arousal/Alertness: Awake/alert Orientation Level: Person;Place;Situation Person: Oriented Place: Oriented Situation: Oriented Year: 2020 Month: September Day of Week: Correct Memory: Appears intact Immediate Memory Recall: Sock;Blue;Bed Memory Recall Sock: Without Cue Memory Recall Blue: Without Cue Memory Recall Bed: Without Cue Awareness: Appears intact Problem Solving: Appears intact Safety/Judgment: Appears intact Sensation Sensation Light Touch: Appears Intact Hot/Cold: Appears Intact Proprioception: Appears Intact Stereognosis: Appears Intact Coordination Gross Motor Movements are Fluid and Coordinated: No Fine Motor Movements are Fluid and Coordinated: Yes Coordination and Movement Description: impaired 2/2 recent L BKA Motor  Motor Motor: Within Functional Limits Motor -  Skilled Clinical Observations: generalized weakness/guarding Mobility  Bed Mobility Bed Mobility: Rolling Right;Rolling Left;Supine to Sit;Sit to Supine Rolling Right: Supervision/verbal cueing  Rolling Left: Supervision/Verbal cueing Supine to Sit: Minimal Assistance - Patient > 75% Sit to Supine: Minimal Assistance - Patient > 75% Transfers Sit to Stand: Maximal Assistance - Patient 25-49% Stand to Sit: Maximal Assistance - Patient 25-49%  Trunk/Postural Assessment  Cervical Assessment Cervical Assessment: Within Functional Limits Thoracic Assessment Thoracic Assessment: Within Functional Limits Lumbar Assessment Lumbar Assessment: Within Functional Limits Postural Control Postural Control: Within Functional Limits  Balance Balance Balance Assessed: Yes Static Sitting Balance Static Sitting - Level of Assistance: 5: Stand by assistance Dynamic Sitting Balance Dynamic Sitting - Level of Assistance: 5: Stand by assistance Static Standing Balance Static Standing - Level of Assistance: 4: Min assist  Dynamic standing - not assessed due to pain Extremity/Trunk Assessment RUE Assessment RUE Assessment: Within Functional Limits LUE Assessment LUE Assessment: Within Functional Limits     Refer to Care Plan for Long Term Goals  Recommendations for other services: None    Discharge Criteria: Patient will be discharged from OT if patient refuses treatment 3 consecutive times without medical reason, if treatment goals not met, if there is a change in medical status, if patient makes no progress towards goals or if patient is discharged from hospital.  The above assessment, treatment plan, treatment alternatives and goals were discussed and mutually agreed upon: by patient  [facility] 08/06/2019, 11:34 AM

## 2019-08-06 NOTE — Evaluation (Signed)
Physical Therapy Assessment and Plan  Patient Details  Name: Joseph Hoffman MRN: [id number] Date of Birth: [birth year]  PT Diagnosis: Difficulty walking, Muscle weakness and Pain in L residual limb Rehab Potential: Good ELOS: 14-18 days   Today's Date: 08/06/2019 PT Individual Time: 6073-7106 PT Individual Time Calculation (min): 70 min    Problem List:  Patient Active Problem List   Diagnosis Date Noted  . Unilateral complete BKA, left, initial encounter ([place]) 08/05/2019  . Leukocytosis   . Acute on chronic anemia   . Post-operative pain   . Nausea & vomiting 07/29/2019  . Diarrhea 07/29/2019  . Diabetic ulcer of left foot associated with diabetes mellitus due to underlying condition ([place]) 07/29/2019  . Left ventricular ejection fraction of 30% to 35% 07/29/2019  . PAD (peripheral artery disease) ([place]) 07/28/2019  . Long term (current) use of anticoagulants 07/13/2019  . CAD, multiple vessel 07/10/2019  . Aortic valve regurgitation   . Aortic valve disease   . Foot ulcer, left ([place]) 06/26/2019  . Depression 05/28/2019  . Congestive heart failure (CHF) ([place]) 05/13/2019  . H/O heart valve replacement with mechanical valve   . Type II diabetes mellitus with renal manifestations ([place]) 05/12/2019  . Chest pain 05/12/2019  . Elevated troponin 05/12/2019  . CKD (chronic kidney disease), stage III ([place]) 05/12/2019  . Acute on chronic systolic (congestive) heart failure ([place]) 05/12/2019  . CRI (chronic renal insufficiency), stage 3 (moderate) ([place]) 04/09/2019  . Acute combined systolic and diastolic heart failure ([place]) 04/09/2019  . Essential hypertension 06/03/2017  . Dyslipidemia, goal LDL below 70 04/11/2017  . AS (aortic stenosis) 10/28/2012  . Acute on chronic systolic heart failure, re-admitted 10/12/12 10/12/2012  . S/P AVR, 09/29/12, [employer]. (discharged 10/05/12) 09/30/2012  . Permanent atrial fibrillation, since 1994 09/30/2012  . Type 2 IDDM 09/30/2012  . OSA on CPAP  09/30/2012  . Normal coronary arteries at cath Oct 2013 09/30/2012  . Chronic anticoagulation 09/30/2012  . Severe aortic stenosis 09/25/2012    Past Medical History:  Past Medical History:  Diagnosis Date  . Anxiety   . Aortic stenosis   . Arthritis   . Asthma   . Asymptomatic LV dysfunction, EF 45%, normal coronary arteries on cardiac cath 09/18/12 09/30/2012  . CHF (congestive heart failure) ([place])   . Chronic anticoagulation, on Xarelto prior to admit 09/30/2012  . DM (diabetes mellitus) ([place]) 09/30/2012  . Hepatitis B 2003  . Hypertension   . Myocardial infarction ([place])   . Permanent atrial fibrillation, since 1994 09/30/2012   stress test 02/08/12- normal study, no significant ischemia  . S/P AVR (aortic valve replacement), 09/30/2012   a. s/p mechcanical AVR in 09/2012 (on Coumadin)  . Sleep apnea    uses CPAP   Past Surgical History:  Past Surgical History:  Procedure Laterality Date  . ABDOMINAL ANGIOGRAM  09/18/2012   Procedure: ABDOMINAL ANGIOGRAM;  Surgeon: Troy Sine, MD;  Location: [facility] CATH LAB;  Service: Cardiovascular;;  . ABDOMINAL AORTOGRAM W/LOWER EXTREMITY N/A 07/02/2019   Procedure: ABDOMINAL AORTOGRAM W/LOWER EXTREMITY;  Surgeon: Marty Heck, MD;  Location: [facility] CV LAB;  Service: Cardiovascular;  Laterality: N/A;  . AMPUTATION Left 07/03/2019   Procedure: AMPUTATION LEFT GREAT TOE;  Surgeon: Marty Heck, MD;  Location: [facility];  Service: Vascular;  Laterality: Left;  . AMPUTATION Left 07/31/2019   Procedure: AMPUTATION BELOW KNEE LEFT;  Surgeon: Marty Heck, MD;  Location: [facility];  Service: Vascular;  Laterality: Left;  .  AORTIC VALVE REPLACEMENT  09/29/2012   Procedure: AORTIC VALVE REPLACEMENT (AVR);  Surgeon: Gaye Pollack, MD;  Location: [facility];  Service: Open Heart Surgery;  Laterality: N/A;  . ARCH AORTOGRAM  09/18/2012   Procedure: ARCH AORTOGRAM;  Surgeon: Troy Sine, MD;  Location: [facility] CATH LAB;  Service:  Cardiovascular;;  . CARDIAC CATHETERIZATION  09/18/12   severe calcific aortic stenosis, peak gradient 52m, mean gradient 541m EF 45%  . CARDIAC VALVE REPLACEMENT     AVR 09-29-12  . CHOLECYSTECTOMY    . FRACTURE SURGERY    . LEFT AND RIGHT HEART CATHETERIZATION WITH CORONARY ANGIOGRAM N/A 09/18/2012   Procedure: LEFT AND RIGHT HEART CATHETERIZATION WITH CORONARY ANGIOGRAM;  Surgeon: ThTroy SineMD;  [facility];  Service: Cardiovascular;  Laterality: N/A;  . PERIPHERAL VASCULAR INTERVENTION Left 07/02/2019   Procedure: PERIPHERAL VASCULAR INTERVENTION;  Surgeon: ClMarty HeckMD;  [facility];  Service: Cardiovascular;  Laterality: Left;  . RIGHT HEART CATH AND CORONARY ANGIOGRAPHY N/A 06/29/2019   Procedure: RIGHT HEART CATH AND CORONARY ANGIOGRAPHY;  Surgeon: EnNelva BushMD;  [facility];  Service: Cardiovascular;  Laterality: N/A;  . TEE WITHOUT CARDIOVERSION N/A 05/29/2019   Procedure: TRANSESOPHAGEAL ECHOCARDIOGRAM (TEE);  Surgeon: CrLelon PerlaMD;  [facility];  Service: Cardiovascular;  Laterality: N/A;    Assessment & Plan Clinical Impression: Patient is a [age]ear old male with history of HTN, T2DM, CAF, mechanical AVR (leaking)- on coumadin,  CAD--pending CABG, systolic CHF, CKD, OSA--CPAP with 2 L oxygen, left great toe amputation 07/03/19  with poor healing and purulent drainage. History taken from chart review and patient. He was admitted on 07/29/2019 with N/V, diarrhea and left foot infection. He was started on IV abx. He underwent L-BKA on  07/31/2019 by Dr. ClCarlis AbbottHospital course complicated by lethargy and confusion, thought to be secondary to narcotics, hypotensions and AKI.  Entersto was discontinued on 08/21. He continues on heparin/Coumadin bridge as INR subtherapeutic and stump sock ordered from [employer] FoodsTherapy evaluations completed and aptient limited by BKA as well neuropathy. CIR recommended due to  functional decline.  Patient transferred to CIR on 08/05/2019 .   Patient currently requires total with mobility secondary to muscle weakness and muscle joint tightness, decreased cardiorespiratoy endurance and decreased sitting balance, decreased standing balance and decreased balance strategies.  Prior to hospitalization, patient was independent  with mobility and lived with Spouse(spouse with medical issues, uses cane but home 24/7) in a House home.  Home access is 1 small step and a threshold to get into house, pt has been negotiating backwards w/ RW since L toe amputationStairs to enter.  Patient will benefit from skilled PT intervention to maximize safe functional mobility, minimize fall risk and decrease caregiver burden for planned discharge home with 24 hour supervision.  Anticipate patient will benefit from follow up HHSandwicht discharge.  PT - End of Session Activity Tolerance: Tolerates < 10 min activity, no significant change in vital signs Endurance Deficit: Yes Endurance Deficit Description: decreased PT Assessment Rehab Potential (ACUTE/IP ONLY): Good PT Barriers to Discharge: Decreased caregiver support;Inaccessible home environment;Home environment access/layout;Wound Care PT Barriers to Discharge Comments: wife w/ her own medical issues, son able to provide PRN assist, 1 small step to enter home PT Patient demonstrates impairments in the following area(s): Balance;Edema;Endurance;Pain;Safety;Skin Integrity PT Transfers Functional Problem(s): Bed Mobility;Bed to Chair;Car;Furniture;Floor PT Locomotion Functional Problem(s): Stairs;Wheelchair Mobility;Ambulation PT Plan PT Intensity: Minimum of 1-2  x/day ,45 to 90 minutes PT Frequency: 5 out of 7 days PT Duration Estimated Length of Stay: 14-18 days PT Treatment/Interventions: Ambulation/gait training;Community reintegration;DME/adaptive equipment instruction;Neuromuscular re-education;Psychosocial support;Stair training;UE/LE Strength  taining/ROM;Wheelchair propulsion/positioning;UE/LE Coordination activities;Therapeutic Activities;Skin care/wound management;Pain management;Functional electrical stimulation;Discharge planning;Balance/vestibular training;Disease management/prevention;Functional mobility training;Patient/family education;Splinting/orthotics;Therapeutic Exercise PT Transfers Anticipated Outcome(s): mod/i PT Locomotion Anticipated Outcome(s): supervision short distance gait PT Recommendation Follow Up Recommendations: Home health PT Patient destination: Home Equipment Recommended: To be determined Equipment Details: has RW already  Skilled Therapeutic Intervention  Pt sitting up in chair upon arrival and sweating and yelling out in pain. Requesting cool wash cloth for forehead and to return to supine. Pt stated pain was 8/10 in L residual limb. Pain subsided some when therapist lowered amputee pad so knee was in more flexed position. Pt agreeable to attempt stedy transfer back to bed. Max assist sit<>stand in stedy and performed bed mobility w/ min assist. After a few minutes, pain subsided to 3/10 and pt visibly more relaxed. Instructed pt in results of PT evaluation as detailed below, PT POC, rehab potential, rehab goals, and discharge recommendations. Introduced amputee education including residual limb shaping and swelling, desensitization techniques, and importance of flexibility and ROM for future prosthetic use. Performed LLE AROM/flexibility exercises as detailed below. Pt required significantly increased time for all mobility and exercises 2/2 pain w/ any active movement. Washed pt's shrinker sock in sink as it was soiled of blood and clear drainage. Incision was dry however when observed. Educated on ACE wrap technique and re-wrapped residual limb w/ total assist. Ended session in supine, all needs in reach.  LLE AROM and flexibility exercises: -quad set, 3x10 -knee-to-chest, 3x5 -supine hip flexor and  hamstring stretch, 1 min x3  -SLR, 1x5   PT Evaluation Precautions/Restrictions Precautions Precautions: Fall Precaution Comments: monitor HR and breathing  Restrictions Weight Bearing Restrictions: Yes LLE Weight Bearing: Non weight bearing Other Position/Activity Restrictions: L BKA General   Vital SignsOxygen Therapy O2 Device: Room Air Pain Pain Assessment Pain Scale: 0-10 Pain Score: 8  Pain Type: Surgical pain Pain Location: Leg Pain Orientation: Left Pain Descriptors / Indicators: Aching;Throbbing Pain Onset: On-going Pain Intervention(s): Repositioned;Rest;Relaxation Home Living/Prior Functioning Home Living Living Arrangements: Spouse/significant other;Children Available Help at Discharge: Available 24 hours/day;Family(wife home 24/7 but has her own medical issues, son lives on pt's 2nd floor, son able to help w/ meals and chores PRN) Type of Home: House Home Access: Stairs to enter [employer] of Steps: 1 small step and a threshold to get into house, pt has been negotiating backwards w/ RW since L toe amputation Home Layout: Two level;Able to live on main level with bedroom/bathroom Bathroom Shower/Tub: Multimedia programmer: Standard  Lives With: Spouse(spouse with medical issues, uses cane but home 24/7) Prior Function Level of Independence: Independent with basic ADLs;Independent with homemaking with ambulation;Independent with gait;Independent with transfers(prior to initial toe amputation in LLE of July 2020, using RW since then)  Able to Take Stairs?: Yes(prior to 1st amputation in July 2020) Driving: Yes(prior to 1st amputation in July 2020) Vocation: Retired Comments: pt used to be a Academic librarian, independent with all ADL and no AD Vision/Perception  Perception Perception: Within Functional Limits Praxis Praxis: Intact  Cognition Overall Cognitive Status: Within Functional Limits for tasks assessed Arousal/Alertness:  Awake/alert Orientation Level: Oriented X4 Memory: Appears intact Immediate Memory Recall: Sock;Blue;Bed Memory Recall Sock: Without Cue Memory Recall Blue: Without Cue Memory Recall Bed: Without Cue Awareness: Appears intact Problem Solving: Appears intact Safety/Judgment: Appears  intact Sensation Sensation Light Touch: Appears Intact Hot/Cold: Appears Intact Proprioception: Appears Intact Stereognosis: Appears Intact Coordination Gross Motor Movements are Fluid and Coordinated: No Fine Motor Movements are Fluid and Coordinated: Yes Coordination and Movement Description: impaired 2/2 recent L BKA Motor  Motor Motor: Within Functional Limits Motor - Skilled Clinical Observations: generalized weakness/guarding  Mobility Bed Mobility Bed Mobility: Rolling Right;Rolling Left;Supine to Sit;Sit to Supine Rolling Right: Supervision/verbal cueing Rolling Left: Supervision/Verbal cueing Supine to Sit: Minimal Assistance - Patient > 75% Sit to Supine: Minimal Assistance - Patient > 75% Transfers Transfers: Stand to Sit;Transfer via Geophysicist/field seismologist;Sit to Stand Sit to Stand: Maximal Assistance - Patient 25-49% Stand to Sit: Maximal Assistance - Patient 25-49% Transfer via Lift Equipment: Probation officer Ambulation: No Gait Gait: No Stairs / Additional Locomotion Stairs: No Wheelchair Mobility Wheelchair Mobility: No  Trunk/Postural Assessment  Cervical Assessment Cervical Assessment: Within Water engineer Thoracic Assessment: Within Functional Limits Lumbar Assessment Lumbar Assessment: Within Functional Limits Postural Control Postural Control: Within Functional Limits  Balance Balance Balance Assessed: Yes Static Sitting Balance Static Sitting - Level of Assistance: 5: Stand by assistance Dynamic Sitting Balance Dynamic Sitting - Level of Assistance: 5: Stand by assistance Static Standing Balance Static Standing - Level of Assistance:  4: Min assist Extremity Assessment  RLE Assessment RLE Assessment: Within Functional Limits LLE Assessment LLE Assessment: Exceptions to [facility] Passive Range of Motion (PROM) Comments: knee flexion limited to 5 to 100 deg, unable to push further into extension or flexion 2/2 pain, hip ROM globally limited 2/2 swelling and guarding General Strength Comments: able to move through available ROM w/o assist, unable to tolerate any resistance 2/2 pain, 3/5 globally    Refer to Care Plan for Long Term Goals  Recommendations for other services: Neuropsych  Discharge Criteria: Patient will be discharged from PT if patient refuses treatment 3 consecutive times without medical reason, if treatment goals not met, if there is a change in medical status, if patient makes no progress towards goals or if patient is discharged from hospital.  The above assessment, treatment plan, treatment alternatives and goals were discussed and mutually agreed upon: by patient  Courtlynn Holloman K Vilas Edgerly 08/06/2019, 12:03 PM

## 2019-08-06 NOTE — Progress Notes (Signed)
[facility] for Heparin and Warfarin Indication: atrial fibrillation and mechanical AVR  Allergies  Allergen Reactions  . Iohexol Anaphylaxis  . Niacin And Related     Flushing with immediate realese  . Penicillins Other (See Comments)    Unknown.[name]  Did it involve swelling of the face/tongue/throat, SOB, or low BP? Unknown Did it involve sudden or severe rash/hives, skin peeling, or any reaction on the inside of your mouth or nose? Unknown Did you need to seek medical attention at a hospital or doctor's office? Unknown When did it last happen?Childhood If all above answers are "NO", may proceed with cephalosporin use.    Patient Measurements: Height: 5\' 11"  (180.3 cm) Weight: 254 lb 3.1 oz (115.3 kg) IBW/kg (Calculated) : 75.3 Heparin Dosing Weight: 99.2 kg  Vital Signs: Temp: 97.6 F (36.4 C) (09/03 0603) Temp Source: Oral (09/03 0603) BP: 122/55 (09/03 0603) Pulse Rate: 71 (09/03 0603)  Labs: Recent Labs    08/04/19 0410 08/05/19 0516 08/06/19 0529 08/06/19 1216  HGB  --   --  9.4*  --   HCT  --   --  29.0*  --   PLT  --   --  237  --   LABPROT 16.0* 19.5*  --  23.7*  INR 1.3* 1.7*  --  2.2*  HEPARINUNFRC 0.63 0.59 0.84* 0.75*  CREATININE  --   --  1.56*  --     Estimated Creatinine Clearance: 56.9 mL/min (A) (by C-G formula based on SCr of 1.56 mg/dL (H)).  Assessment: 68 yo M on warfarin PTA for mechanical AVR + afib.  Warfarin was held for BKA and pt was initiated on heparin bridge.  Patient is now s/p left BKA. Heparin level remains elevated despite rate reductions. INR is now trending up fairly quickly but not yet therapeutic.   Warfarin dose PTA = 10 mg daily except 12.5mg  TTS  Goal of Therapy:  INR 2.5-3.5 Heparin level 0.3-0.7 units/ml Monitor platelets by anticoagulation protocol: Yes   Plan:  Reduce heparin gtt to 2300 units/hr Check an 8 hr heparin level  Decrease warfarin  dose to 7.5mg  PO x 1 tonight Daily INR, heparin level and CBC  Salome Arnt, PharmD, BCPS Please see AMION for all pharmacy numbers 08/06/2019 1:27 PM

## 2019-08-06 NOTE — Progress Notes (Signed)
ANTICOAGULATION CONSULT NOTE - Follow Up Consult  Pharmacy Consult for heparin Indication: Afib/AVR  Labs: Recent Labs    08/03/19 0729 08/04/19 0410 08/05/19 0516 08/06/19 0529  HGB 9.6*  --   --  9.4*  HCT 30.2*  --   --  29.0*  PLT 225  --   --  237  LABPROT 15.7* 16.0* 19.5*  --   INR 1.3* 1.3* 1.7*  --   HEPARINUNFRC 0.64 0.63 0.59 0.84*  CREATININE 2.33*  --   --  1.56*    Assessment: 68yo male supratherapeutic on heparin after three levels at goal; Hgb low but stable, Plt stable.  Goal of Therapy:  Heparin level 0.3-0.7 units/ml   Plan:  Will decrease heparin gtt by 1-2 units/kg/hr to 2500 units/hr and check level in 6-8 hours.    Wynona Neat, PharmD, BCPS  08/06/2019,6:25 AM

## 2019-08-06 NOTE — Progress Notes (Signed)
Social Work Assessment and Plan   Patient Details  Name: Joseph Hoffman MRN: [id number] Date of Birth: 10/24/1951  Today's Date: 08/06/2019  Problem List:  Patient Active Problem List   Diagnosis Date Noted  . Unilateral complete BKA, left, initial encounter ([place]) 08/05/2019  . Leukocytosis   . Acute on chronic anemia   . Post-operative pain   . Nausea & vomiting 07/29/2019  . Diarrhea 07/29/2019  . Diabetic ulcer of left foot associated with diabetes mellitus due to underlying condition ([place]) 07/29/2019  . Left ventricular ejection fraction of 30% to 35% 07/29/2019  . PAD (peripheral artery disease) ([place]) 07/28/2019  . Long term (current) use of anticoagulants 07/13/2019  . CAD, multiple vessel 07/10/2019  . Aortic valve regurgitation   . Aortic valve disease   . Foot ulcer, left ([place]) 06/26/2019  . Depression 05/28/2019  . Congestive heart failure (CHF) ([place]) 05/13/2019  . H/O heart valve replacement with mechanical valve   . Type II diabetes mellitus with renal manifestations ([place]) 05/12/2019  . Chest pain 05/12/2019  . Elevated troponin 05/12/2019  . CKD (chronic kidney disease), stage III ([place]) 05/12/2019  . Acute on chronic systolic (congestive) heart failure ([place]) 05/12/2019  . CRI (chronic renal insufficiency), stage 3 (moderate) ([place]) 04/09/2019  . Acute combined systolic and diastolic heart failure ([place]) 04/09/2019  . Essential hypertension 06/03/2017  . Dyslipidemia, goal LDL below 70 04/11/2017  . AS (aortic stenosis) 10/28/2012  . Acute on chronic systolic heart failure, re-admitted 10/12/12 10/12/2012  . S/P AVR, 09/29/12, [employer]. (discharged 10/05/12) 09/30/2012  . Permanent atrial fibrillation, since 1994 09/30/2012  . Type 2 IDDM 09/30/2012  . OSA on CPAP 09/30/2012  . Normal coronary arteries at cath Oct 2013 09/30/2012  . Chronic anticoagulation 09/30/2012  . Severe aortic stenosis 09/25/2012   Past Medical History:  Past Medical History:  Diagnosis  Date  . Anxiety   . Aortic stenosis   . Arthritis   . Asthma   . Asymptomatic LV dysfunction, EF 45%, normal coronary arteries on cardiac cath 09/18/12 09/30/2012  . CHF (congestive heart failure) ([place])   . Chronic anticoagulation, on Xarelto prior to admit 09/30/2012  . DM (diabetes mellitus) ([place]) 09/30/2012  . Hepatitis B 2003  . Hypertension   . Myocardial infarction ([place])   . Permanent atrial fibrillation, since 1994 09/30/2012   stress test 02/08/12- normal study, no significant ischemia  . S/P AVR (aortic valve replacement), 09/30/2012   a. s/p mechcanical AVR in 09/2012 (on Coumadin)  . Sleep apnea    uses CPAP   Past Surgical History:  Past Surgical History:  Procedure Laterality Date  . ABDOMINAL ANGIOGRAM  09/18/2012   Procedure: ABDOMINAL ANGIOGRAM;  Surgeon: Troy Sine, MD;  Location: [facility] CATH LAB;  Service: Cardiovascular;;  . ABDOMINAL AORTOGRAM W/LOWER EXTREMITY N/A 07/02/2019   Procedure: ABDOMINAL AORTOGRAM W/LOWER EXTREMITY;  Surgeon: Marty Heck, MD;  Location: [facility] CV LAB;  Service: Cardiovascular;  Laterality: N/A;  . AMPUTATION Left 07/03/2019   Procedure: AMPUTATION LEFT GREAT TOE;  Surgeon: Marty Heck, MD;  Location: [facility];  Service: Vascular;  Laterality: Left;  . AMPUTATION Left 07/31/2019   Procedure: AMPUTATION BELOW KNEE LEFT;  Surgeon: Marty Heck, MD;  Location: [facility];  Service: Vascular;  Laterality: Left;  . AORTIC VALVE REPLACEMENT  09/29/2012   Procedure: AORTIC VALVE REPLACEMENT (AVR);  Surgeon: Gaye Pollack, MD;  Location: [facility];  Service: Open Heart Surgery;  Laterality: N/A;  .  ARCH AORTOGRAM  09/18/2012   Procedure: ARCH AORTOGRAM;  Surgeon: Troy Sine, MD;  Location: [facility] CATH LAB;  Service: Cardiovascular;;  . CARDIAC CATHETERIZATION  09/18/12   severe calcific aortic stenosis, peak gradient 40mm, mean gradient 33mm, EF 45%  . CARDIAC VALVE REPLACEMENT     AVR 09-29-12  . CHOLECYSTECTOMY    .  FRACTURE SURGERY    . LEFT AND RIGHT HEART CATHETERIZATION WITH CORONARY ANGIOGRAM N/A 09/18/2012   Procedure: LEFT AND RIGHT HEART CATHETERIZATION WITH CORONARY ANGIOGRAM;  Surgeon: Troy Sine, MD;  Location: [facility] CATH LAB;  Service: Cardiovascular;  Laterality: N/A;  . PERIPHERAL VASCULAR INTERVENTION Left 07/02/2019   Procedure: PERIPHERAL VASCULAR INTERVENTION;  Surgeon: Marty Heck, MD;  Location: [facility] CV LAB;  Service: Cardiovascular;  Laterality: Left;  . RIGHT HEART CATH AND CORONARY ANGIOGRAPHY N/A 06/29/2019   Procedure: RIGHT HEART CATH AND CORONARY ANGIOGRAPHY;  Surgeon: Nelva Bush, MD;  Location: [place] CV LAB;  Service: Cardiovascular;  Laterality: N/A;  . TEE WITHOUT CARDIOVERSION N/A 05/29/2019   Procedure: TRANSESOPHAGEAL ECHOCARDIOGRAM (TEE);  Surgeon: Lelon Perla, MD;  Location: [facility] ENDOSCOPY;  Service: Cardiovascular;  Laterality: N/A;   Social History:  reports that he quit smoking about 36 years ago. His smoking use included cigarettes. He has a 2.50 pack-year smoking history. He has quit using smokeless tobacco. He reports current alcohol use. He reports that he does not use drugs.  Family / Support Systems Marital Status: Married Patient Roles: Spouse, Parent Spouse/Significant Other: [name] [phone]  [phone] Children: Dan-son [phone] Other Supports: Daughter in [place] and son in [place] Anticipated Caregiver: Self- wife has health issues Ability/Limitations of Caregiver: wife can do supervision only due to health issues and son lives up stairs and works Building control surveyor Availability: 24/7 Family Dynamics: Close knit with family, have some friends who are supportive. Pt voiced he was suppose to help his wife and not be the one who is ill and needs assist. Their son may be able to assist some-per pt he will help his Mom more than he.  Social History Preferred language: English Religion: Catholic Cultural Background: No  issues Education: Secretary/administrator educated Read: Yes Write: Yes Employment Status: Retired Public relations account executive Issues: No issues Guardian/Conservator: None-according to MD pt is capable of making his own decisions while here.   Abuse/Neglect Abuse/Neglect Assessment Can Be Completed: Yes Physical Abuse: Denies Verbal Abuse: Denies Sexual Abuse: Denies Exploitation of patient/patient's resources: Denies Self-Neglect: Denies  Emotional Status Pt's affect, behavior and adjustment status: Pt is motivated to recover and get back to his independent level. He had recovered from his toe being amputated 07/03/2019. He has always been independent and taken care of himself and plans to again. He does not to burden his wife who is dealing with her own health issues. Recent Psychosocial Issues: other health issues-feels his fault due to not managing his diabetes Psychiatric History: No history-do feel he would benefit from seeing neuro-psych while here due to feels responsible for his amputation. He would benefit from talking to someone while here and with his wife's health issues. Substance Abuse History: No issues  Patient / Family Perceptions, Expectations & Goals Pt/Family understanding of illness & functional limitations: Pt and wife can explain his amputation and treatment plan going forward. he is not one who shy always from asking his questions and wanting the answers even if it is something he doesn't want to hear. Premorbid pt/family roles/activities: Husband, father, retiree, friend, etc Anticipated changes in roles/activities/participation:  resume Pt/family expectations/goals: Pt states: " I plan on being able to do for myself when I leave here."  Wife states: " I will do what I can but am limited myself."  [employer]: Other (Comment) Premorbid Home Care/DME Agencies: Other (Comment)([facility] was following prior to admission and rw) Transportation available at  discharge: Wife Resource referrals recommended: Neuropsychology, Support group (specify)  Discharge Planning Living Arrangements: Spouse/significant other, Children Support Systems: Spouse/significant other, Children, Friends/neighbors Type of Residence: Private residence Insurance Resources: [employer], Multimedia programmer (specify)([employer]) Museum/gallery curator Resources: Fish farm manager, Family Support Financial Screen Referred: No Living Expenses: Own Money Management: Patient, Spouse Does the patient have any problems obtaining your medications?: No Home Management: Both he and wife when able Patient/Family Preliminary Plans: Return home with wife who can be there but not really provide assist due to her own health issues. Will await therapy team evaluations and will work on discharge needs. Will make neuro-psych referral for coping. Social Work Anticipated Follow Up Needs: HH/OP, Support Group  Clinical Impression Pleasant very talkative gentleman who is motivated to do well here and get back to his independent level. His wife has her own health issues and can only provide supervision level. Their son will help wife but pt feels will not assist him. Will await evaluations and work on discharge needs. Will make neuro-psych referral.  Elease Hashimoto 08/06/2019, 10:29 AM

## 2019-08-06 NOTE — Progress Notes (Signed)
[facility] PHYSICAL MEDICINE & REHABILITATION PROGRESS NOTE   Subjective/Complaints: Pt reports slept poorly- pain "not too bad" 5/10- poor sleep- more problems staying asleep- only passing gas- LBM 5-6 days ago- feels awful/constipated.  Will d/c foley- PA discussed with pt to remove it.     Objective:   No results found. Recent Labs    08/06/19 0529  WBC 12.0*  HGB 9.4*  HCT 29.0*  PLT 237   Recent Labs    08/06/19 0529  NA 136  K 4.1  CL 100  CO2 27  GLUCOSE 128*  BUN 59*  CREATININE 1.56*  CALCIUM 8.9    Intake/Output Summary (Last 24 hours) at 08/06/2019 1130 Last data filed at 08/06/2019 0700 Gross per 24 hour  Intake 378.19 ml  Output 2000 ml  Net -1621.81 ml     Physical Exam: Vital Signs Blood pressure (!) 122/55, pulse 71, temperature 97.6 F (36.4 C), temperature source Oral, resp. rate 18, height 5\' 11"  (1.803 m), weight 115.3 kg, SpO2 99 %.  Nursing note, labs and vitals reviewed. Constitutional: awake, alert, appropriate, sitting up in bed, eating breakfast, NAD  Morbidly obese  HENT:  Head: Normocephalic and atraumatic.  Eyes: EOM are normal.  Neck: No tracheal deviation present. No thyromegaly present.  CV- RRR Respiratory: CTA B/L  GI: abd protuberantdistended;  NT, a little firm/not hard; (+) hypoactive BS  Musculoskeletal:     Comments: Left stump edema and tenderness  Neurological: He is alert and oriented to person, place, and time.  Motor: B/l UE, RLE: 5/5 proximal to distal LLE: HF 3/5 (pain inhibition) Sensation grossly intact to light touch  Skin: He is not diaphoretic.  Left stump with dressing c/d/i  Psychiatric: He has a normal mood and affect. His behavior is normal.       Assessment/Plan: 1. Functional deficits secondary to L BKA which require 3+ hours per day of interdisciplinary therapy in a comprehensive inpatient rehab setting.  Physiatrist is providing close team supervision and 24 hour management of active  medical problems listed below.  Physiatrist and rehab team continue to assess barriers to discharge/monitor patient progress toward functional and medical goals  Care Tool:  Bathing    Body parts bathed by patient: Right arm, Left arm, Chest, Abdomen, Front perineal area, Right upper leg, Left upper leg, Face   Body parts bathed by helper: Buttocks, Right lower leg Body parts n/a: Left lower leg   Bathing assist Assist Level: Moderate Assistance - Patient 50 - 74%     Upper Body Dressing/Undressing Upper body dressing   What is the patient wearing?: Hospital gown only    Upper body assist Assist Level: Set up assist    Lower Body Dressing/Undressing Lower body dressing      What is the patient wearing?: Underwear/pull up, Pants     Lower body assist Assist for lower body dressing: Maximal Assistance - Patient 25 - 49%     Toileting Toileting    Toileting assist       Transfers Chair/bed transfer  Transfers assist     Chair/bed transfer assist level: Moderate Assistance - Patient 50 - 74%     Locomotion Ambulation   Ambulation assist              Walk 10 feet activity   Assist           Walk 50 feet activity   Assist           Walk 150  feet activity   Assist           Walk 10 feet on uneven surface  activity   Assist           Wheelchair     Assist               Wheelchair 50 feet with 2 turns activity    Assist            Wheelchair 150 feet activity     Assist          Blood pressure (!) 122/55, pulse 71, temperature 97.6 F (36.4 C), temperature source Oral, resp. rate 18, height 5\' 11"  (1.803 m), weight 115.3 kg, SpO2 99 %.   Medical Problem List and Plan: 1.  Deficits with mobility, transfers, self-care secondary to left BKA.             Admit to CIR. 2.  Mechanical AVR/Antithrombotics: -DVT/anticoagulation:  Pharmaceutical: Coumadin             -antiplatelet therapy:  N/A 3. Pain Management: Still on IV dilaudid for pain--> will plan to d/c and change to oxycodone prn.              Monitor with increased mobility  9/3- pt reports pain 5/10- meds keep it controlled pretty well- will con't 4. Mood: LCSW to follow for evaluation and support.              -antipsychotic agents: N/A 5. Neuropsych: This patient is capable of making decisions on his own behalf. 6. Skin/Wound Care: Monitor wound for healing.  7. Fluids/Electrolytes/Nutrition: Strict I/Os. Monitor weights daily.              CMP ordered for tomorrow AM. 8. CAD/Systolic CHF: Added HH restrictions to diet. On Lanoxin, Metoprolol and Demadex.              Monitor for signs/symptoms of fluid overload             Daily weights 9. Acute on chronic renal failure: BUN/SCr - 62/1.98 at admission-->76/2.33. May need to  decrease gabapentin dose if SCr continues to worsen.    9/3- Cr down to 1.56 and BUN down to 59 from 2.33 and 76- will  10 Leucocytosis: Monitor wound, trend and for other signs of infection.              CBC ordered for tomorrow AM  9/3- wbc 12K- 63% segs- will recheck in AM 11. Acute on chronic anemia:             9/3 stable 12. COPD: Respiratory status stable on Dulera 13. OSA: Has been compliant with CPAP at nights.  14. T2DM with neuropathy: On Lantus and amaryl with BS reasonable. Monitor BS ac/hs             Monitor with increased mobility 15. Anxiety/depression: Managed on Lexapro.  16. Constipation- LBM 5-6 days ago- ordered Mg citrate and Suppository 17. Foley- will d/c foley and get pt voiding hopefully. 18. Insomnia- will give trazodone 50 mg QHS prn. 19. Dispo- will determine length of stay at team conference- likely 7-10 days    LOS: 1 days A FACE TO FACE EVALUATION WAS PERFORMED  Shon Indelicato 08/06/2019, 11:30 AM

## 2019-08-07 ENCOUNTER — Inpatient Hospital Stay (HOSPITAL_COMMUNITY): Payer: Medicare Other | Admitting: Physical Therapy

## 2019-08-07 ENCOUNTER — Inpatient Hospital Stay (HOSPITAL_COMMUNITY): Payer: Medicare Other | Admitting: Occupational Therapy

## 2019-08-07 ENCOUNTER — Inpatient Hospital Stay (HOSPITAL_COMMUNITY): Payer: Medicare Other

## 2019-08-07 LAB — BASIC METABOLIC PANEL
Anion gap: 10 (ref 5–15)
BUN: 51 mg/dL — ABNORMAL HIGH (ref 8–23)
CO2: 27 mmol/L (ref 22–32)
Calcium: 8.9 mg/dL (ref 8.9–10.3)
Chloride: 99 mmol/L (ref 98–111)
Creatinine, Ser: 1.47 mg/dL — ABNORMAL HIGH (ref 0.61–1.24)
GFR calc Af Amer: 55 mL/min — ABNORMAL LOW (ref >60)
GFR calc non Af Amer: 48 mL/min — ABNORMAL LOW (ref >60)
Glucose, Bld: 156 mg/dL — ABNORMAL HIGH (ref 70–99)
Potassium: 4.2 mmol/L (ref 3.5–5.1)
Sodium: 136 mmol/L (ref 135–145)

## 2019-08-07 LAB — CBC
HCT: 28.9 % — ABNORMAL LOW (ref 39.0–52.0)
Hemoglobin: 9.1 g/dL — ABNORMAL LOW (ref 13.0–17.0)
MCH: 25.9 pg — ABNORMAL LOW (ref 26.0–34.0)
MCHC: 31.5 g/dL (ref 30.0–36.0)
MCV: 82.3 fL (ref 80.0–100.0)
Platelets: 253 10*3/uL (ref 150–400)
RBC: 3.51 MIL/uL — ABNORMAL LOW (ref 4.22–5.81)
RDW: 18.5 % — ABNORMAL HIGH (ref 11.5–15.5)
WBC: 12.4 10*3/uL — ABNORMAL HIGH (ref 4.0–10.5)
nRBC: 0 % (ref 0.0–0.2)

## 2019-08-07 LAB — GLUCOSE, CAPILLARY
Glucose-Capillary: 151 mg/dL — ABNORMAL HIGH (ref 70–99)
Glucose-Capillary: 149 mg/dL — ABNORMAL HIGH (ref 70–99)
Glucose-Capillary: 173 mg/dL — ABNORMAL HIGH (ref 70–99)
Glucose-Capillary: 148 mg/dL — ABNORMAL HIGH (ref 70–99)
Glucose-Capillary: 221 mg/dL — ABNORMAL HIGH (ref 70–99)

## 2019-08-07 LAB — URINE CULTURE: Culture: NO GROWTH

## 2019-08-07 LAB — PROTIME-INR
INR: 1.9 — ABNORMAL HIGH (ref 0.8–1.2)
Prothrombin Time: 21.7 seconds — ABNORMAL HIGH (ref 11.4–15.2)

## 2019-08-07 LAB — HEPARIN LEVEL (UNFRACTIONATED): Heparin Unfractionated: 0.64 IU/mL (ref 0.30–0.70)

## 2019-08-07 MED ORDER — HYDROCORTISONE 1 % EX LOTN
TOPICAL_LOTION | Freq: Three times a day (TID) | CUTANEOUS | Status: DC
  Filled 2019-08-07: qty 118

## 2019-08-07 MED ORDER — SENNOSIDES-DOCUSATE SODIUM 8.6-50 MG PO TABS
1.0000 | ORAL_TABLET | Freq: Two times a day (BID) | ORAL | Status: DC
  Administered 2019-08-07 – 2019-08-12 (×11): 1 via ORAL
  Filled 2019-08-07 (×11): qty 1

## 2019-08-07 MED ORDER — WARFARIN SODIUM 5 MG PO TABS
10.0000 mg | ORAL_TABLET | Freq: Once | ORAL | Status: AC
  Administered 2019-08-07: 18:00:00 10 mg via ORAL
  Filled 2019-08-07: qty 2

## 2019-08-07 MED ORDER — HYDROCORTISONE 1 % EX CREA
TOPICAL_CREAM | Freq: Three times a day (TID) | CUTANEOUS | Status: DC
  Administered 2019-08-07 – 2019-08-12 (×12): via TOPICAL
  Filled 2019-08-07: qty 28

## 2019-08-07 NOTE — Progress Notes (Signed)
Occupational Therapy Session Note  Patient Details  Name: NARAIN WAUGAMAN MRN: [id number] Date of Birth: Sep 10, 1951  Today's Date: 08/07/2019 OT Individual Time: [id number] OT Individual Time Calculation (min): 70 min    Short Term Goals: Week 1:  OT Short Term Goal 1 (Week 1): Pt will be able to transfer to [facility] and or toilet with min A using LRAD. OT Short Term Goal 2 (Week 1): Pt will be able to bathe LB with S OT Short Term Goal 3 (Week 1): Pt will be able to dress LB with min A. OT Short Term Goal 4 (Week 1): Pt will be able to maintain static stand for 1 minute with min A to pull clothing over hips.  Skilled Therapeutic Interventions/Progress Updates:    Pt resting in w/c upon arrival and agreeable to therapy.  Continued discharge planning.  Grooming seated in w/c at sink.  Pt requested use of urinal.  Issued self inspection mirror.  Discussed importance of self inspection. Continued education regarding of importance of knee etxtension to prepare for prosthesis.  Green Theraband issued (see below) for BUE therex.  Completed 3 sets of each exercise. [place] board transfer to bed with max A and max verbal cues for sequencing. Pt remained in bed with all needs within reach and bed alarm activated.   Therapy Documentation Precautions:  Precautions Precautions: Fall Precaution Comments: monitor HR and breathing  Restrictions Weight Bearing Restrictions: Yes LLE Weight Bearing: Non weight bearing Other Position/Activity Restrictions: L BKA  Pain:  Pt c/o 4/10 pain in LLE changing to 8/10 when LLE in dependent position   Exercises: General Exercises - Upper Extremity Shoulder Flexion: Strengthening;Both;5 reps;Supine;Theraband Theraband Level (Shoulder Flexion): Level 3 (Green) Shoulder Extension: AROM;Both;5 reps;Supine;Theraband Theraband Level (Shoulder Extension): Level 3 (Green) Shoulder ABduction: AROM;Both;5 reps;Supine;Theraband Theraband Level (Shoulder Abduction): Level 3  (Green) Shoulder ADduction: AROM;Both;5 reps;Supine;Theraband Theraband Level (Shoulder Adduction): Level 3 (Green) Elbow Flexion: AROM;Both;5 reps;Supine;Theraband Theraband Level (Elbow Flexion): Level 3 (Green) Elbow Extension: AROM;Both;5 reps;Supine;Theraband Theraband Level (Elbow Extension): Level 3 (Green)   Therapy/Group: Individual Therapy  [name] 08/07/2019, 12:08 PM

## 2019-08-07 NOTE — Progress Notes (Signed)
Physical Therapy Session Note  Patient Details  Name: Joseph Hoffman MRN: [id number] Date of Birth: [birth year]  Today's Date: 08/07/2019 PT Individual Time: 1540-1625 PT Individual Time Calculation (min): 45 min   Short Term Goals: Week 1:  PT Short Term Goal 1 (Week 1): Pt will initiate gait training PT Short Term Goal 2 (Week 1): Pt will perform bed<>chair transfer w/ mod assist PT Short Term Goal 3 (Week 1): Pt will tolerate sitting OOB in between therapy sessions for 2 hours PT Short Term Goal 4 (Week 1): Pt will self-propel w/c 150' w/ supervision  Skilled Therapeutic Interventions/Progress Updates:   Pt received supine in bed and agreeable to PT. Supine>sit transfer with supervision assist and increased time. SB transfer to [place] with min assist moderate cues for UE placement and safety. Pt reports need for urination and BM. SB transfer to wide BSC over toilet with mod assist and moderate cues for LE positioning and anterior weight shift to prevent sliding off board. Sitting balance EOB x 30mn to attempt BM with no LE support due to height of BSC. Pt performed forward reach to multiple objects with supervision assist from PT. While sitting on BSC pt reports phantom pain. Pain management instructed by PT for improved desensitization on distal residual limb. Squat [facility]. Pt returned to room and performed transfer to bed with squat pivot and mod assist. Sit>supine completed with supervision assist and moderate cues for use of bed features. Pt left supine in bed with call bell in reach and all needs met.         Therapy Documentation Precautions:  Precautions Precautions: Fall Precaution Comments: monitor HR and breathing  Restrictions Weight Bearing Restrictions: Yes LLE Weight Bearing: Non weight bearing Other Position/Activity Restrictions: L BKA    Vital Signs: Therapy Vitals Temp: 99.1 F (37.3 C) Temp Source: Oral Pulse Rate: (!)  52 Resp: 16 BP: 109/80 Patient Position (if appropriate): Sitting Oxygen Therapy SpO2: 96 % O2 Device: Room Air Pain: Pain Assessment Pain Scale: 0-10 Pain Score: 8  Faces Pain Scale: Hurts whole lot Pain Type: Phantom pain Pain Location: Other (Comment)(BKA) Pain Orientation: Left Pain Intervention(s): Medication (See eMAR)  Other Treatments:      Therapy/Group: Individual Therapy  ALorie Phenix9/03/2019, 5:43 PM

## 2019-08-07 NOTE — Progress Notes (Signed)
Occupational Therapy Session Note  Patient Details  Name: Joseph Hoffman MRN: [id number] Date of Birth: 1951-02-22  Today's Date: 08/07/2019 OT Individual Time: [id number] OT Individual Time Calculation (min): 75 min    Short Term Goals: Week 1:  OT Short Term Goal 1 (Week 1): Pt will be able to transfer to [facility] and or toilet with min A using LRAD. OT Short Term Goal 2 (Week 1): Pt will be able to bathe LB with S OT Short Term Goal 3 (Week 1): Pt will be able to dress LB with min A. OT Short Term Goal 4 (Week 1): Pt will be able to maintain static stand for 1 minute with min A to pull clothing over hips.  Skilled Therapeutic Interventions/Progress Updates:    Pt received in bed ready for therapy.    ADL Retraining:     see details below  Sat at EOB for B/d.  Today pt demonstrated improved dynamic reach in sitting to reach R foot (except for bottom of foot) and looping clothing over R foot.  He has difficulty with L limb due to sensory issues with phantom pain and sensation.  lateral leans to pull underwear and pants over hips with min A.  Transfers: A to position slide board and then pt moved over to wc with CGA.  Improved movement pattern to move over efficiently vs his transfer yesterday.  Once in wc positioned Leg pad with additional padding for better L limb support.   Pt in w/c completing grooming at the sink.  Call light in reach.     Therapy Documentation Precautions:  Precautions Precautions: Fall Precaution Comments: monitor HR and breathing  Restrictions Weight Bearing Restrictions: Yes LLE Weight Bearing: Non weight bearing Other Position/Activity Restrictions: L BKA    Vital Signs: Oxygen Therapy O2 Device: Room Air   Pain: 7/10 pain LLE - pt premedicated   ADL: ADL Eating: Independent Grooming: Independent Where Assessed-Grooming: Sitting at sink Upper Body Bathing: Setup Where Assessed-Upper Body Bathing: Edge of bed Lower Body Bathing: Minimal  assistance Where Assessed-Lower Body Bathing: Edge of bed Upper Body Dressing: Setup Where Assessed-Upper Body Dressing: Wheelchair Lower Body Dressing: Moderate assistance Where Assessed-Lower Body Dressing: Edge of bed   Therapy/Group: Individual Therapy  [place] 08/07/2019, 11:03 AM

## 2019-08-07 NOTE — Progress Notes (Addendum)
[place] PHYSICAL MEDICINE & REHABILITATION PROGRESS NOTE   Subjective/Complaints: Pt reports slept better- had 3 extra large BMs yesterday and they were like "cannonballs" and very painful- hope to not do that again.  Also c/o itching all over his back- denies any rash- just itching.   Objective:   No results found. Recent Labs    08/06/19 0529 08/07/19 0546  WBC 12.0* 12.4*  HGB 9.4* 9.1*  HCT 29.0* 28.9*  PLT 237 253   Recent Labs    08/06/19 0529 08/07/19 0546  NA 136 136  K 4.1 4.2  CL 100 99  CO2 27 27  GLUCOSE 128* 156*  BUN 59* 51*  CREATININE 1.56* 1.47*  CALCIUM 8.9 8.9    Intake/Output Summary (Last 24 hours) at 08/07/2019 1913 Last data filed at 08/07/2019 1841 Gross per 24 hour  Intake 630 ml  Output 4350 ml  Net -3720 ml     Physical Exam: Vital Signs Blood pressure 109/80, pulse (!) 52, temperature 98.2 F (36.8 C), temperature source Oral, resp. rate 16, height 5\' 11"  (1.803 m), weight 115.3 kg, SpO2 96 %.  Nursing note, labs and vitals reviewed. Constitutional: awake, alert, appropriate, sitting up in bed, watching TV, NAD  HENT:  Head: Normocephalic and atraumatic.  Eyes: EOM are normal.  Neck: No tracheal deviation present. No thyromegaly present.  CV- RRR Respiratory: CTA B/L  GI: abd less protuberantdistended;  NT, soft; (+) hypoactive BS  Musculoskeletal:     Comments: Left stump edema and tenderness  Neurological: He is alert and oriented to person, place, and time.  Motor: B/l UE, RLE: 5/5 proximal to distal LLE: HF 3/5 (pain inhibition) Sensation grossly intact to light touch  Skin: He is not diaphoretic.  Left stump with dressing c/d/i  Psychiatric: He has a normal mood and affect. His behavior is normal.       Assessment/Plan: 1. Functional deficits secondary to L BKA which require 3+ hours per day of interdisciplinary therapy in a comprehensive inpatient rehab setting.  Physiatrist is providing close team  supervision and 24 hour management of active medical problems listed below.  Physiatrist and rehab team continue to assess barriers to discharge/monitor patient progress toward functional and medical goals  Care Tool:  Bathing    Body parts bathed by patient: Right arm, Left arm, Chest, Abdomen, Front perineal area, Right upper leg, Left upper leg, Face, Right lower leg   Body parts bathed by helper: Buttocks Body parts n/a: Left lower leg   Bathing assist Assist Level: Minimal Assistance - Patient > 75%     Upper Body Dressing/Undressing Upper body dressing   What is the patient wearing?: Hospital gown only    Upper body assist Assist Level: Set up assist    Lower Body Dressing/Undressing Lower body dressing      What is the patient wearing?: Underwear/pull up, Pants     Lower body assist Assist for lower body dressing: Moderate Assistance - Patient 50 - 74%     Toileting Toileting    Toileting assist Assist for toileting: Maximal Assistance - Patient 25 - 49%     Transfers Chair/bed transfer  Transfers assist     Chair/bed transfer assist level: Minimal Assistance - Patient > 75%(slide board)     Locomotion Ambulation   Ambulation assist   Ambulation activity did not occur: Safety/medical concerns          Walk 10 feet activity   Assist  Walk 10 feet activity did  not occur: Safety/medical concerns        Walk 50 feet activity   Assist Walk 50 feet with 2 turns activity did not occur: Safety/medical concerns         Walk 150 feet activity   Assist Walk 150 feet activity did not occur: Safety/medical concerns         Walk 10 feet on uneven surface  activity   Assist Walk 10 feet on uneven surfaces activity did not occur: Safety/medical concerns         Wheelchair     Assist     Wheelchair activity did not occur: (pain)         Wheelchair 50 feet with 2 turns activity    Assist             Wheelchair 150 feet activity     Assist          Blood pressure 109/80, pulse (!) 52, temperature 98.2 F (36.8 C), temperature source Oral, resp. rate 16, height 5\' 11"  (1.803 m), weight 115.3 kg, SpO2 96 %.   Medical Problem List and Plan: 1.  Deficits with mobility, transfers, self-care secondary to left BKA.             Admit to CIR. 2.  Mechanical AVR/Antithrombotics: -DVT/anticoagulation:  Pharmaceutical: Coumadin             -antiplatelet therapy: N/A 3. Pain Management: Still on IV dilaudid for pain--> will plan to d/c and change to oxycodone prn.              Monitor with increased mobility  9/3- pt reports pain 5/10- meds keep it controlled pretty well- will con't 4. Mood: LCSW to follow for evaluation and support.              -antipsychotic agents: N/A 5. Neuropsych: This patient is capable of making decisions on his own behalf. 6. Skin/Wound Care: Monitor wound for healing.  7. Fluids/Electrolytes/Nutrition: Strict I/Os. Monitor weights daily.              CMP ordered for tomorrow AM. 8. CAD/Systolic CHF: Added HH restrictions to diet. On Lanoxin, Metoprolol and Demadex.              Monitor for signs/symptoms of fluid overload             Daily weights 9. Acute on chronic renal failure: BUN/SCr - 62/1.98 at admission-->76/2.33. May need to  decrease gabapentin dose if SCr continues to worsen.    9/3- Cr down to 1.56 and BUN down to 59 from 2.33 and 76-   9/4- Cr down to 1.47 and BUN 51- heading downwards trend is great- will recheck Sunday 10 Leucocytosis: Monitor wound, trend and for other signs of infection.              CBC ordered for tomorrow AM  9/3- wbc 12K- 63% segs- will recheck in AM  9/4- WBC 12.4k- still afebrile; will recheck Sunday and monitor clinically 11. Acute on chronic anemia:             9/3 stable 12. COPD: Respiratory status stable on Dulera 13. OSA: Has been compliant with CPAP at nights.  14. T2DM with neuropathy: On Lantus and  amaryl with BS reasonable. Monitor BS ac/hs             Monitor with increased mobility 15. Anxiety/depression: Managed on Lexapro.  16. Constipation- LBM 5-6 days ago- ordered Mg citrate and  Suppository  9/4- cleaned out- wil add Senokot 1 tab BID to keep him from becoming constipated 17. Foley- will d/c foley and get pt voiding hopefully. 18. Insomnia- will give trazodone 50 mg QHS prn. 19. Itching- 9/4- will order cortisone lotion for back TID 20. On Coumadin - INR is 1.9 - was 2.2/1.7- will stop daily checks when stable x 2 days  20. Dispo- will determine length of stay at team conference- likely 7-10 days    LOS: 2 days A FACE TO FACE EVALUATION WAS PERFORMED  Suni Jarnagin 08/07/2019, 7:13 PM

## 2019-08-07 NOTE — Progress Notes (Signed)
[place] for Heparin and Warfarin Indication: atrial fibrillation and mechanical AVR  Patient Measurements: Height: 5\' 11"  (180.3 cm) Weight: 254 lb 3.1 oz (115.3 kg) IBW/kg (Calculated) : 75.3 Heparin Dosing Weight: 99.2 kg  Vital Signs: Temp: 98.4 F (36.9 C) (09/04 0601) Temp Source: Axillary (09/04 0601) BP: 106/38 (09/04 0601) Pulse Rate: 59 (09/04 0601)  Labs: Recent Labs    08/05/19 0516 08/06/19 0529 08/06/19 1216 08/06/19 2138 08/07/19 0546  HGB  --  9.4*  --   --  9.1*  HCT  --  29.0*  --   --  28.9*  PLT  --  237  --   --  253  LABPROT 19.5*  --  23.7*  --  21.7*  INR 1.7*  --  2.2*  --  1.9*  HEPARINUNFRC 0.59 0.84* 0.75* 0.51 0.64  CREATININE  --  1.56*  --   --  1.47*    Estimated Creatinine Clearance: 60.4 mL/min (A) (by C-G formula based on SCr of 1.47 mg/dL (H)).  Assessment: 68 yo M on warfarin PTA for mechanical AVR + afib.  Warfarin was held for BKA and pt was initiated on heparin bridge.  Patient is now s/p left BKA. Heparin level remains at goal. INR is now unexpectedly decreased after rapidly increasing the last few days. No bleeding noted.   Warfarin dose PTA = 10 mg daily except 12.5mg  TTS  Goal of Therapy:  INR 2.5-3.5 Heparin level 0.3-0.7 units/ml Monitor platelets by anticoagulation protocol: Yes   Plan:  Continue Heparin gtt at 2300 units/hr Warfarin 10mg  PO x 1 tonight Daily INR, heparin level and CBC  Salome Arnt, PharmD, BCPS Please see AMION for all pharmacy numbers 08/07/2019 7:53 AM

## 2019-08-08 DIAGNOSIS — R339 Retention of urine, unspecified: Secondary | ICD-10-CM | POA: Diagnosis present

## 2019-08-08 LAB — PROTIME-INR
INR: 2.4 — ABNORMAL HIGH (ref 0.8–1.2)
Prothrombin Time: 25.6 seconds — ABNORMAL HIGH (ref 11.4–15.2)

## 2019-08-08 LAB — GLUCOSE, CAPILLARY
Glucose-Capillary: 147 mg/dL — ABNORMAL HIGH (ref 70–99)
Glucose-Capillary: 203 mg/dL — ABNORMAL HIGH (ref 70–99)
Glucose-Capillary: 203 mg/dL — ABNORMAL HIGH (ref 70–99)
Glucose-Capillary: 328 mg/dL — ABNORMAL HIGH (ref 70–99)

## 2019-08-08 LAB — HEPARIN LEVEL (UNFRACTIONATED): Heparin Unfractionated: 0.59 IU/mL (ref 0.30–0.70)

## 2019-08-08 MED ORDER — TAMSULOSIN HCL 0.4 MG PO CAPS
0.8000 mg | ORAL_CAPSULE | Freq: Every day | ORAL | Status: DC
  Administered 2019-08-08 – 2019-08-12 (×5): 0.8 mg via ORAL
  Filled 2019-08-08 (×5): qty 2

## 2019-08-08 MED ORDER — WARFARIN SODIUM 7.5 MG PO TABS
12.5000 mg | ORAL_TABLET | Freq: Once | ORAL | Status: AC
  Administered 2019-08-08: 12.5 mg via ORAL
  Filled 2019-08-08: qty 1

## 2019-08-08 NOTE — IPOC Note (Signed)
Overall Plan of Care [facility]) Patient Details Name: Joseph Hoffman MRN: [id number] DOB: 22-Mar-1951  Admitting Diagnosis: <principal problem not specified>  Hospital Problems: Active Problems:   Stage 3 chronic kidney disease ([place])   Unilateral complete BKA, left, initial encounter ([place])   Insomnia due to medical condition   Drug-induced constipation   Anticoagulated on Coumadin   Urinary retention     Functional Problem List: Nursing Bladder  PT Balance, Edema, Endurance, Pain, Safety, Skin Integrity  OT Balance, Endurance, Motor, Pain  SLP    TR         Basic ADL's: OT Bathing, Dressing, Toileting     Advanced  ADL's: OT       Transfers: PT Bed Mobility, Bed to Chair, Car, Furniture, Floor  OT Toilet, Tub/Shower     Locomotion: PT Stairs, Emergency planning/management officer, Ambulation     Additional Impairments: OT None  SLP        TR      Anticipated Outcomes Item Anticipated Outcome  Self Feeding no goal, pt is I  Swallowing      Basic self-care  Mod I  Toileting  Mod I   Bathroom Transfers Mod I to toilet, S to shower  Bowel/Bladder  manage bladder with min assist  Transfers  mod/i  Locomotion  supervision short distance gait  Communication     Cognition     Pain  )Pain at or below level 5  Safety/Judgment  Maintain safety with cues/reminders   Therapy Plan: PT Intensity: Minimum of 1-2 x/day ,45 to 90 minutes PT Frequency: 5 out of 7 days PT Duration Estimated Length of Stay: 14-18 days OT Intensity: Minimum of 1-2 x/day, 45 to 90 minutes OT Frequency: 5 out of 7 days OT Duration/Estimated Length of Stay: 14-17 days     Due to the current state of emergency, patients may not be receiving their 3-hours of Medicare-mandated therapy.   Team Interventions: Nursing Interventions Bladder Management  PT interventions Ambulation/gait training, Community reintegration, DME/adaptive equipment instruction, Neuromuscular re-education, Psychosocial support,  Stair training, UE/LE Strength taining/ROM, Wheelchair propulsion/positioning, UE/LE Coordination activities, Therapeutic Activities, Skin care/wound management, Pain management, Functional electrical stimulation, Discharge planning, Training and development officer, Disease management/prevention, Functional mobility training, Patient/family education, Splinting/orthotics, Therapeutic Exercise  OT Interventions Balance/vestibular training, Discharge planning, Disease mangement/prevention, Pain management, Functional mobility training, DME/adaptive equipment instruction, Patient/family education, Psychosocial support, Self Care/advanced ADL retraining, Skin care/wound managment, UE/LE Strength taining/ROM, Therapeutic Exercise, Therapeutic Activities  SLP Interventions    TR Interventions    SW/CM Interventions Discharge Planning, Psychosocial Support, Patient/Family Education   Barriers to Discharge MD  Medical stability, Neurogenic bowel and bladder, Wound care and Weight  Nursing      PT Decreased caregiver support, Inaccessible home environment, Home environment access/layout, Wound Care wife w/ her own medical issues, son able to provide PRN assist, 1 small step to enter home  OT Decreased caregiver support wife is a Education officer, environmental but has physical limitations  SLP      SW       Team Discharge Planning: Destination: PT-Home ,OT- Home , SLP-  Projected Follow-up: PT-Home health PT, OT-  Home health OT, SLP-  Projected Equipment Needs: PT-To be determined, OT- 3 in 1 bedside comode, Tub/shower bench, SLP-  Equipment Details: PT-has RW already, OT-  Patient/family involved in discharge planning: PT- Patient,  OT-Patient, SLP-   MD ELOS: 14-18 days Medical Rehab Prognosis:  Good Assessment: Pt is a 68 yr old male with CHF, on Coumadin and  heparin gtt, with urinary retention and s/p L BKA- goal is Mod I to supervision for short distances.     See Team Conference Notes for weekly updates to the plan of  care

## 2019-08-08 NOTE — Progress Notes (Signed)
[place] for Heparin and Warfarin Indication: atrial fibrillation and mechanical AVR  Patient Measurements: Height: 5\' 11"  (180.3 cm) Weight: 244 lb 0.8 oz (110.7 kg) IBW/kg (Calculated) : 75.3 Heparin Dosing Weight: 99.2 kg  Vital Signs: Temp: 97.7 F (36.5 C) (09/05 0519) Temp Source: Oral (09/05 0519) BP: 105/60 (09/05 0519) Pulse Rate: 56 (09/05 0609)  Labs: Recent Labs    08/06/19 0529 08/06/19 1216 08/06/19 2138 08/07/19 0546 08/08/19 0502  HGB 9.4*  --   --  9.1*  --   HCT 29.0*  --   --  28.9*  --   PLT 237  --   --  253  --   LABPROT  --  23.7*  --  21.7* 25.6*  INR  --  2.2*  --  1.9* 2.4*  HEPARINUNFRC 0.84* 0.75* 0.51 0.64 0.59  CREATININE 1.56*  --   --  1.47*  --      Assessment: 68 yo M on warfarin PTA for mechanical AVR + afib.  Warfarin was held for BKA and pt was initiated on heparin bridge.  Patient is now s/p left BKA. Warfarin has been reinitiated post-operatively. INR close goal, trending in correct direction. Heparin level is at goal. Small drop in hgb, plts still wnl.  Warfarin dose PTA = 10 mg daily except 12.5mg  TTS  Goal of Therapy:  INR 2.5-3.5 Heparin level 0.3-0.7 units/ml Monitor platelets by anticoagulation protocol: Yes   Plan:  Continue Heparin gtt at 2300 units/hr Warfarin 12.5 mg po x 1  Daily INR, heparin level and CBC Discontinue heparin when INR >/ 2.5   Joseph Hoffman 08/08/2019 11:09 AM

## 2019-08-08 NOTE — Progress Notes (Signed)
[place] PHYSICAL MEDICINE & REHABILITATION PROGRESS NOTE   Subjective/Complaints: Pt reports that he doesn't understand why he's needing caths- "taking voiding meds for fluid/CHF".  Went over discussion of why he needs cathing- to STOP reducing his intake.  Cream is helping itching. Is using "stump sock" on LLE.   Objective:   No results found. Recent Labs    08/06/19 0529 08/07/19 0546  WBC 12.0* 12.4*  HGB 9.4* 9.1*  HCT 29.0* 28.9*  PLT 237 253   Recent Labs    08/06/19 0529 08/07/19 0546  NA 136 136  K 4.1 4.2  CL 100 99  CO2 27 27  GLUCOSE 128* 156*  BUN 59* 51*  CREATININE 1.56* 1.47*  CALCIUM 8.9 8.9    Intake/Output Summary (Last 24 hours) at 08/08/2019 1127 Last data filed at 08/08/2019 0700 Gross per 24 hour  Intake 1381.1 ml  Output 3275 ml  Net -1893.9 ml     Physical Exam: Vital Signs Blood pressure 105/60, pulse (!) 56, temperature 97.7 F (36.5 C), temperature source Oral, resp. rate 16, height 5\' 11"  (1.803 m), weight 110.7 kg, SpO2 97 %.  Nursing note, labs and vitals reviewed. Constitutional: awake, alert, appropriate, sitting up in bed, watching TV and on ipad/pad, NAD  HENT:  Head: Normocephalic and atraumatic.  Eyes: EOM are normal.  Neck: No tracheal deviation present. No thyromegaly present.  CV- RRR Respiratory: CTA B/L  GI: abd less protuberantdistended;  NT, soft; (+) hypoactive BS  Musculoskeletal:     Comments: Left stump edema and tenderness  Neurological: He is alert and oriented to person, place, and time.  Motor: B/l UE, RLE: 5/5 proximal to distal LLE: HF 3/5 (pain inhibition) Sensation grossly intact to light touch  Skin: He is not diaphoretic.  Left stump with stump sock- some bruising on tip of residual limb- dog ears pulling in; staples in place; no drainage; mild-mod edema  Psychiatric: He has a normal mood and affect. His behavior is normal.       Assessment/Plan: 1. Functional deficits secondary to L  BKA which require 3+ hours per day of interdisciplinary therapy in a comprehensive inpatient rehab setting.  Physiatrist is providing close team supervision and 24 hour management of active medical problems listed below.  Physiatrist and rehab team continue to assess barriers to discharge/monitor patient progress toward functional and medical goals  Care Tool:  Bathing    Body parts bathed by patient: Right arm, Left arm, Chest, Abdomen, Front perineal area, Right upper leg, Left upper leg, Face, Right lower leg   Body parts bathed by helper: Buttocks Body parts n/a: Left lower leg   Bathing assist Assist Level: Minimal Assistance - Patient > 75%     Upper Body Dressing/Undressing Upper body dressing   What is the patient wearing?: Hospital gown only    Upper body assist Assist Level: Minimal Assistance - Patient > 75%    Lower Body Dressing/Undressing Lower body dressing      What is the patient wearing?: Underwear/pull up, Pants     Lower body assist Assist for lower body dressing: Moderate Assistance - Patient 50 - 74%     Toileting Toileting    Toileting assist Assist for toileting: Moderate Assistance - Patient 50 - 74%(mod a with urinal, dependent with catheterizations)     Transfers Chair/bed transfer  Transfers assist     Chair/bed transfer assist level: Minimal Assistance - Patient > 75%(slide board)     Locomotion Ambulation  Ambulation assist   Ambulation activity did not occur: Safety/medical concerns          Walk 10 feet activity   Assist  Walk 10 feet activity did not occur: Safety/medical concerns        Walk 50 feet activity   Assist Walk 50 feet with 2 turns activity did not occur: Safety/medical concerns         Walk 150 feet activity   Assist Walk 150 feet activity did not occur: Safety/medical concerns         Walk 10 feet on uneven surface  activity   Assist Walk 10 feet on uneven surfaces activity did  not occur: Safety/medical concerns         Wheelchair     Assist     Wheelchair activity did not occur: (pain)         Wheelchair 50 feet with 2 turns activity    Assist            Wheelchair 150 feet activity     Assist          Blood pressure 105/60, pulse (!) 56, temperature 97.7 F (36.5 C), temperature source Oral, resp. rate 16, height 5\' 11"  (1.803 m), weight 110.7 kg, SpO2 97 %.   Medical Problem List and Plan: 1.  Deficits with mobility, transfers, self-care secondary to left BKA.             Admit to CIR. 2.  Mechanical AVR/Antithrombotics: -DVT/anticoagulation:  Pharmaceutical: Coumadin             -antiplatelet therapy: N/A  9/5- on heparin gtt- until INR >2.5- INR heading up- is 2.4  3. Pain Management: Still on IV dilaudid for pain--> will plan to d/c and change to oxycodone prn.              Monitor with increased mobility  9/3- pt reports pain 5/10- meds keep it controlled pretty well- will con't 4. Mood: LCSW to follow for evaluation and support.              -antipsychotic agents: N/A 5. Neuropsych: This patient is capable of making decisions on his own behalf. 6. Skin/Wound Care: Monitor wound for healing.  7. Fluids/Electrolytes/Nutrition: Strict I/Os. Monitor weights daily.              CMP ordered for tomorrow AM. 8. CAD/Systolic CHF: Added HH restrictions to diet. On Lanoxin, Metoprolol and Demadex.              Monitor for signs/symptoms of fluid overload             Daily weights 9. Acute on chronic renal failure: BUN/SCr - 62/1.98 at admission-->76/2.33. May need to  decrease gabapentin dose if SCr continues to worsen.    9/3- Cr down to 1.56 and BUN down to 59 from 2.33 and 76-   9/4- Cr down to 1.47 and BUN 51- heading downwards trend is great- will recheck Sunday 10 Leucocytosis: Monitor wound, trend and for other signs of infection.              CBC ordered for tomorrow AM  9/3- wbc 12K- 63% segs- will recheck in  AM  9/4- WBC 12.4k- still afebrile; will recheck Sunday and monitor clinically 11. Acute on chronic anemia:             9/3 stable 12. COPD: Respiratory status stable on Dulera 13. OSA: Has been compliant with CPAP at nights.  14. T2DM with neuropathy: On Lantus and amaryl with BS reasonable. Monitor BS ac/hs             Monitor with increased mobility 15. Anxiety/depression: Managed on Lexapro.  16. Constipation- LBM 5-6 days ago- ordered Mg citrate and Suppository  9/4- cleaned out- wil add Senokot 1 tab BID to keep him from becoming constipated 17. Foley- will d/c foley and get pt voiding hopefully.  9/5- still requiring in/out caths due to elevated volumes- will increase Flomax to 0.8 mg QHS to help him void.  18. Insomnia- will give trazodone 50 mg QHS prn. 19. Itching- 9/4- will order cortisone lotion for back TID  9/5- helping 20. On Coumadin - INR is 1.9 - was 2.2/1.7- will stop daily checks when stable x 2 days  9/5- INR 2.4- will stop heparin gtt when INR gets >2.5- hopefully tomorrow 21. Dispo- will determine length of stay at team conference- likely 7-10 days    LOS: 3 days A FACE TO FACE EVALUATION WAS PERFORMED  Domitila Stetler 08/08/2019, 11:27 AM

## 2019-08-09 ENCOUNTER — Inpatient Hospital Stay (HOSPITAL_COMMUNITY): Payer: Medicare Other

## 2019-08-09 LAB — URINALYSIS, ROUTINE W REFLEX MICROSCOPIC
Bilirubin Urine: NEGATIVE
Glucose, UA: 50 mg/dL — AB
Hgb urine dipstick: NEGATIVE
Ketones, ur: NEGATIVE mg/dL
Leukocytes,Ua: NEGATIVE
Nitrite: NEGATIVE
Protein, ur: NEGATIVE mg/dL
Specific Gravity, Urine: 1.009 (ref 1.005–1.030)
pH: 6 (ref 5.0–8.0)

## 2019-08-09 LAB — CBC WITH DIFFERENTIAL/PLATELET
Abs Immature Granulocytes: 0.58 10*3/uL — ABNORMAL HIGH (ref 0.00–0.07)
Basophils Absolute: 0.1 10*3/uL (ref 0.0–0.1)
Basophils Relative: 1 %
Eosinophils Absolute: 1.5 10*3/uL — ABNORMAL HIGH (ref 0.0–0.5)
Eosinophils Relative: 12 %
HCT: 28.5 % — ABNORMAL LOW (ref 39.0–52.0)
Hemoglobin: 8.7 g/dL — ABNORMAL LOW (ref 13.0–17.0)
Immature Granulocytes: 5 %
Lymphocytes Relative: 10 %
Lymphs Abs: 1.2 10*3/uL (ref 0.7–4.0)
MCH: 26 pg (ref 26.0–34.0)
MCHC: 30.5 g/dL (ref 30.0–36.0)
MCV: 85.1 fL (ref 80.0–100.0)
Monocytes Absolute: 1.1 10*3/uL — ABNORMAL HIGH (ref 0.1–1.0)
Monocytes Relative: 9 %
Neutro Abs: 8.1 10*3/uL — ABNORMAL HIGH (ref 1.7–7.7)
Neutrophils Relative %: 63 %
Platelets: 242 10*3/uL (ref 150–400)
RBC: 3.35 MIL/uL — ABNORMAL LOW (ref 4.22–5.81)
RDW: 18.6 % — ABNORMAL HIGH (ref 11.5–15.5)
WBC: 12.6 10*3/uL — ABNORMAL HIGH (ref 4.0–10.5)
nRBC: 0 % (ref 0.0–0.2)

## 2019-08-09 LAB — GLUCOSE, CAPILLARY
Glucose-Capillary: 164 mg/dL — ABNORMAL HIGH (ref 70–99)
Glucose-Capillary: 169 mg/dL — ABNORMAL HIGH (ref 70–99)
Glucose-Capillary: 118 mg/dL — ABNORMAL HIGH (ref 70–99)
Glucose-Capillary: 271 mg/dL — ABNORMAL HIGH (ref 70–99)

## 2019-08-09 LAB — BASIC METABOLIC PANEL
Anion gap: 10 (ref 5–15)
BUN: 51 mg/dL — ABNORMAL HIGH (ref 8–23)
CO2: 27 mmol/L (ref 22–32)
Calcium: 9.1 mg/dL (ref 8.9–10.3)
Chloride: 96 mmol/L — ABNORMAL LOW (ref 98–111)
Creatinine, Ser: 1.76 mg/dL — ABNORMAL HIGH (ref 0.61–1.24)
GFR calc Af Amer: 44 mL/min — ABNORMAL LOW (ref >60)
GFR calc non Af Amer: 38 mL/min — ABNORMAL LOW (ref >60)
Glucose, Bld: 181 mg/dL — ABNORMAL HIGH (ref 70–99)
Potassium: 4.3 mmol/L (ref 3.5–5.1)
Sodium: 133 mmol/L — ABNORMAL LOW (ref 135–145)

## 2019-08-09 LAB — PROTIME-INR
INR: 2.4 — ABNORMAL HIGH (ref 0.8–1.2)
Prothrombin Time: 26.1 seconds — ABNORMAL HIGH (ref 11.4–15.2)

## 2019-08-09 LAB — HEPARIN LEVEL (UNFRACTIONATED)
Heparin Unfractionated: 0.74 IU/mL — ABNORMAL HIGH (ref 0.30–0.70)
Heparin Unfractionated: 0.67 IU/mL (ref 0.30–0.70)

## 2019-08-09 MED ORDER — OXYCODONE HCL 5 MG PO TABS
5.0000 mg | ORAL_TABLET | ORAL | Status: DC | PRN
  Administered 2019-08-09 – 2019-08-12 (×8): 5 mg via ORAL
  Filled 2019-08-09 (×8): qty 1

## 2019-08-09 MED ORDER — DIAZEPAM 5 MG PO TABS
5.0000 mg | ORAL_TABLET | Freq: Two times a day (BID) | ORAL | Status: DC | PRN
  Administered 2019-08-09: 18:00:00 5 mg via ORAL
  Filled 2019-08-09: qty 1

## 2019-08-09 MED ORDER — WARFARIN SODIUM 7.5 MG PO TABS
15.0000 mg | ORAL_TABLET | Freq: Once | ORAL | Status: AC
  Administered 2019-08-09: 18:00:00 15 mg via ORAL
  Filled 2019-08-09: qty 2

## 2019-08-09 MED ORDER — INSULIN GLARGINE 100 UNIT/ML ~~LOC~~ SOLN
33.0000 [IU] | Freq: Two times a day (BID) | SUBCUTANEOUS | Status: DC
  Administered 2019-08-09: 20:00:00 16.5 [IU] via SUBCUTANEOUS
  Administered 2019-08-10 – 2019-08-12 (×6): 33 [IU] via SUBCUTANEOUS
  Filled 2019-08-09 (×10): qty 0.33

## 2019-08-09 MED ORDER — DOCUSATE SODIUM 100 MG PO CAPS
100.0000 mg | ORAL_CAPSULE | Freq: Two times a day (BID) | ORAL | Status: DC
  Administered 2019-08-09 – 2019-08-12 (×7): 100 mg via ORAL
  Filled 2019-08-09 (×8): qty 1

## 2019-08-09 NOTE — Progress Notes (Signed)
[place] for Heparin and Warfarin Indication: atrial fibrillation and mechanical AVR  Patient Measurements: Height: 5\' 11"  (180.3 cm) Weight: 245 lb 9.5 oz (111.4 kg) IBW/kg (Calculated) : 75.3 Heparin Dosing Weight: 99.2 kg  Vital Signs: Temp: 97.9 F (36.6 C) (09/06 1218) Temp Source: Oral (09/06 1218) BP: 156/67 (09/06 1800) Pulse Rate: 62 (09/06 1800)  Labs: Recent Labs    08/07/19 0546 08/08/19 0502 08/09/19 0356 08/09/19 1924  HGB 9.1*  --  8.7*  --   HCT 28.9*  --  28.5*  --   PLT 253  --  242  --   LABPROT 21.7* 25.6* 26.1*  --   INR 1.9* 2.4* 2.4*  --   HEPARINUNFRC 0.64 0.59 0.74* 0.67  CREATININE 1.47*  --  1.76*  --      Assessment: 68 yo M on warfarin PTA for mechanical AVR + afib.  Warfarin was held for BKA and pt was initiated on heparin bridge.  Patient is now s/p left BKA. Warfarin has been reinitiated post-operatively. INR close to goal. Heparin now therapeutic at 0.67 this evening.  Warfarin dose PTA = 10 mg daily except 12.5mg  TTS  Goal of Therapy:  INR 2.5-3.5 Heparin level 0.3-0.7 units/ml Monitor platelets by anticoagulation protocol: Yes   Plan:  -Continue heparin 2100 units/hr -Daily heparin level and CBC   Arrie Senate, PharmD, BCPS Clinical Pharmacist [phone] Please check AMION for all [place] numbers 08/09/2019

## 2019-08-09 NOTE — Progress Notes (Addendum)
Physical Therapy Session Note  Patient Details  Name: Joseph Hoffman MRN: [id number] Date of Birth: 1951/04/25  Today's Date: 08/09/2019 PT Individual Time: 1100-1200, 1515-1605 PT Individual Time Calculation (min): 60 min , 50 min  Short Term Goals: Week 1:  PT Short Term Goal 1 (Week 1): Pt will initiate gait training PT Short Term Goal 2 (Week 1): Pt will perform bed<>chair transfer w/ mod assist PT Short Term Goal 3 (Week 1): Pt will tolerate sitting OOB in between therapy sessions for 2 hours PT Short Term Goal 4 (Week 1): Pt will self-propel w/c 150' w/ supervision  Skilled Therapeutic Interventions/Progress Updates:  tx 1:  Pt resting in bed; heparin IV in place.  He donned boxers and shorts in bed with extra time.  Supine> sit with bed features, supervision.  Pt dizzy upon sitting up; this resolved in about 1 minute.  Therapeutic exercise performed with LE to increase strength for functional mobility; seated 2 x 15 bil adductor squeezes, 1 x 15 trunk flexion, R heel raises, L short arc quad knee extensions.    Slide board transfers with mod/max assist, to w/c.  Pt locked/unlocked w/c brakes with tactile cues.  Simulated car transfer to sedan ht seat attempted without slide board, but unsafe.  With slide board, mod assist to enter, min assist to exit.  Pt exhausted and asked to get back in bed.  Min assist slide board transfer.  Pt dizzy during transfer with min assist needed for balance.  Sit> supine with min assist.  At end of session, pt resting in bed with alarm set and needs at hand.  tx 2:  Pt resting in bed, Heparin IV in place.  Pt missed 10 min due to Nsg catheterizing him. He rated pain "15" on a scale of 1-10; posterior residual limb, cramping muscle pain.  He was willing to do bedside strengthening: supine-  2 x 15 cervical flexion, R ankle pumps, R straight leg raises, 2 x 10 R/L shoulder protraction with extended elbow; 1 x 10 L hip extension with towel roll under  distal thigh, bil bridging . In R side lying, 1 x 15 L hip abduction and L hip extension with isometric squeeze at end range of extension.  PT instructed pt in relaxation imagery and diaphragmatic breathing to address pain, with fair carry-over.  Pt initiated rubbing residual limb and posterior thigh due to muscle cramping.  PT asked Deidre Ala about adding a muscle relaxant ; she will follow up with PA. Pain now rated 10/10 posterior L thigh.   At end of session, pt resting in bed with needs at hand and bed alarm set.     Therapy Documentation Precautions:  Precautions Precautions: Fall Precaution Comments: monitor HR and breathing  Restrictions Weight Bearing Restrictions: Yes LLE Weight Bearing: Non weight bearing Other Position/Activity Restrictions: L BKA  Pain:  AM-Pain Assessment Pain Scale: 0-10 Pain Score: 8 Pain Type: Acute pain Pain Location: Leg Pain Descriptors / Indicators: Burning Pain Frequency: Intermittent Pain Onset: On-going Pain Intervention(s): Medication (See eMAR)  PM- see above      Therapy/Group: Individual Therapy  [name] 08/09/2019, 12:11 PM

## 2019-08-09 NOTE — Progress Notes (Signed)
Placed pt on CPAP with autosettings and pt tolerating well

## 2019-08-09 NOTE — Progress Notes (Signed)
[place] for Heparin and Warfarin Indication: atrial fibrillation and mechanical AVR  Patient Measurements: Height: 5\' 11"  (180.3 cm) Weight: 245 lb 9.5 oz (111.4 kg) IBW/kg (Calculated) : 75.3 Heparin Dosing Weight: 99.2 kg  Vital Signs: Temp: 98.5 F (36.9 C) (09/06 0315) Temp Source: Oral (09/06 0315) BP: 134/48 (09/06 0836) Pulse Rate: 60 (09/06 0836)  Labs: Recent Labs    08/07/19 0546 08/08/19 0502 08/09/19 0356  HGB 9.1*  --  8.7*  HCT 28.9*  --  28.5*  PLT 253  --  242  LABPROT 21.7* 25.6* 26.1*  INR 1.9* 2.4* 2.4*  HEPARINUNFRC 0.64 0.59 0.74*  CREATININE 1.47*  --  1.76*     Assessment: 68 yo M on warfarin PTA for mechanical AVR + afib.  Warfarin was held for BKA and pt was initiated on heparin bridge.  Patient is now s/p left BKA. Warfarin has been reinitiated post-operatively. INR close to goal. Patient required 85 mg of warfarin over the past 7 days, this averages to 12 mg/day - still with a subtherapeutic INR. I believe 12.5 mg daily would be adequate for this patient at discharge. Steady decline in hgb over the past 7 days.  Warfarin dose PTA = 10 mg daily except 12.5mg  TTS  Goal of Therapy:  INR 2.5-3.5 Heparin level 0.3-0.7 units/ml Monitor platelets by anticoagulation protocol: Yes   Plan:  Reduce heparin to 2100 units/hr Check level in 8 hours Warfarin 15 mg po x 1 - just one time to get him over INR 2.5 then try to get patient on 12.5 mg daily schedule  Daily INR, heparin level and CBC Discontinue heparin when INR >/ 2.5 Monitor hgb   Joseph Hoffman 08/09/2019 9:59 AM

## 2019-08-09 NOTE — Progress Notes (Signed)
Called that pt having "horrific pain"- crying it's so bad and he has not shown any sign of increased pain in last few days- shooting, stabbing pain- per nursing, gabapentin was late, however didn't think could be the cause, or at least all of the cause.  Per nurse, he's also describing severe muscle cramping Sx's.  Will give low dose Valium 5 mg BID prn x 3 days  And give some oxycodone for the next few doses as well- advised nursing to NOT give them within 2 hours of each other.  If pain gets worse, call Dr Haskell Riling call back

## 2019-08-09 NOTE — Progress Notes (Signed)
[facility] PHYSICAL MEDICINE & REHABILITATION PROGRESS NOTE   Subjective/Complaints: Pt reports  Not voiding as of yet, however got another suppository/clean out last night- were "cannonballs" again and needed assistance from nursing.  Cr increased from 1.47 to 1.76 in 1 day- WBC still slightly elevated at 12.6- afebrile and "feels good" except lack of voiding/constipation.  Objective:   No results found. Recent Labs    08/07/19 0546 08/09/19 0356  WBC 12.4* 12.6*  HGB 9.1* 8.7*  HCT 28.9* 28.5*  PLT 253 242   Recent Labs    08/07/19 0546 08/09/19 0356  NA 136 133*  K 4.2 4.3  CL 99 96*  CO2 27 27  GLUCOSE 156* 181*  BUN 51* 51*  CREATININE 1.47* 1.76*  CALCIUM 8.9 9.1    Intake/Output Summary (Last 24 hours) at 08/09/2019 1017 Last data filed at 08/09/2019 0705 Gross per 24 hour  Intake 1321.76 ml  Output 3225 ml  Net -1903.24 ml     Physical Exam: Vital Signs Blood pressure (!) 134/48, pulse 60, temperature 98.5 F (36.9 C), temperature source Oral, resp. rate 18, height 5\' 11"  (1.803 m), weight 111.4 kg, SpO2 94 %.  Nursing note, labs and vitals reviewed. Constitutional: awake, alert, appropriate, sitting at sink in manual w/c, OT at bedside, still on Heparin gtt, NAD HENT:  Head: Normocephalic and atraumatic.  Eyes: EOM are normal.  Neck: No tracheal deviation present. No thyromegaly present.  CV- RRR Respiratory: CTA B/L  GI: abd less protuberantdistended;  NT, soft; (+) hypoactive BS  Musculoskeletal:     Comments: Left stump edema and tenderness  Neurological: He is alert and oriented to person, place, and time.  Motor: B/l UE, RLE: 5/5 proximal to distal LLE: HF 3/5 (pain inhibition) Sensation grossly intact to light touch  Skin: He is not diaphoretic.  Left stump with stump sock- some bruising on tip of residual limb- dog ears pulling in; staples in place; no drainage; mild-mod edema ; no signs of infection Psychiatric: He has a normal mood and  affect. His behavior is normal.       Assessment/Plan: 1. Functional deficits secondary to L BKA which require 3+ hours per day of interdisciplinary therapy in a comprehensive inpatient rehab setting.  Physiatrist is providing close team supervision and 24 hour management of active medical problems listed below.  Physiatrist and rehab team continue to assess barriers to discharge/monitor patient progress toward functional and medical goals  Care Tool:  Bathing    Body parts bathed by patient: Right arm, Left arm, Chest, Abdomen, Front perineal area, Right upper leg, Left upper leg, Face, Right lower leg   Body parts bathed by helper: Buttocks Body parts n/a: Left lower leg   Bathing assist Assist Level: Minimal Assistance - Patient > 75%     Upper Body Dressing/Undressing Upper body dressing   What is the patient wearing?: Hospital gown only    Upper body assist Assist Level: Set up assist    Lower Body Dressing/Undressing Lower body dressing      What is the patient wearing?: Underwear/pull up, Pants     Lower body assist Assist for lower body dressing: Moderate Assistance - Patient 50 - 74%     Toileting Toileting    Toileting assist Assist for toileting: Moderate Assistance - Patient 50 - 74%     Transfers Chair/bed transfer  Transfers assist     Chair/bed transfer assist level: Minimal Assistance - Patient > 75%(slideboard)     Locomotion  Ambulation   Ambulation assist   Ambulation activity did not occur: Safety/medical concerns          Walk 10 feet activity   Assist  Walk 10 feet activity did not occur: Safety/medical concerns        Walk 50 feet activity   Assist Walk 50 feet with 2 turns activity did not occur: Safety/medical concerns         Walk 150 feet activity   Assist Walk 150 feet activity did not occur: Safety/medical concerns         Walk 10 feet on uneven surface  activity   Assist Walk 10 feet on  uneven surfaces activity did not occur: Safety/medical concerns         Wheelchair     Assist     Wheelchair activity did not occur: (pain)         Wheelchair 50 feet with 2 turns activity    Assist            Wheelchair 150 feet activity     Assist          Blood pressure (!) 134/48, pulse 60, temperature 98.5 F (36.9 C), temperature source Oral, resp. rate 18, height 5\' 11"  (1.803 m), weight 111.4 kg, SpO2 94 %.   CBG (last 3)  Recent Labs    08/08/19 1631 08/08/19 2134 08/09/19 0622  GLUCAP 203* 328* 164*    Medical Problem List and Plan: 1.  Deficits with mobility, transfers, self-care secondary to left BKA.             Admit to CIR. 2.  Mechanical AVR/Antithrombotics: -DVT/anticoagulation:  Pharmaceutical: Coumadin             -antiplatelet therapy: N/A  9/5- on heparin gtt- until INR >2.5- INR heading up- is 2.4  3. Pain Management: Still on IV dilaudid for pain--> will plan to d/c and change to oxycodone prn.              Monitor with increased mobility  9/3- pt reports pain 5/10- meds keep it controlled pretty well- will con't 4. Mood: LCSW to follow for evaluation and support.              -antipsychotic agents: N/A 5. Neuropsych: This patient is capable of making decisions on his own behalf. 6. Skin/Wound Care: Monitor wound for healing.  7. Fluids/Electrolytes/Nutrition: Strict I/Os. Monitor weights daily.              CMP ordered for tomorrow AM. 8. CAD/Systolic CHF: Added HH restrictions to diet. On Lanoxin, Metoprolol and Demadex.              Monitor for signs/symptoms of fluid overload             Daily weights 9. Acute on chronic renal failure: BUN/SCr - 62/1.98 at admission-->76/2.33. May need to  decrease gabapentin dose if SCr continues to worsen.    9/3- Cr down to 1.56 and BUN down to 59 from 2.33 and 76-   9/4- Cr down to 1.47 and BUN 51- heading downwards trend is great- will recheck Sunday  9/6- Cr up to 1.76 from  1.47- will encourage pt to drink a little more; BUN stable at 51 10 Leucocytosis: Monitor wound, trend and for other signs of infection.              CBC ordered for tomorrow AM  9/3- wbc 12K- 63% segs- will recheck in AM  9/4- WBC 12.4k- still afebrile; will recheck Sunday and monitor clinically  9/6- check U/A and Cx 11. Acute on chronic anemia:             9/3 stable 12. COPD: Respiratory status stable on Dulera 13. OSA: Has been compliant with CPAP at nights.  14. T2DM with neuropathy: On Lantus and amaryl with BS reasonable. Monitor BS ac/hs             Monitor with increased mobility  9/6- variable BGs- 100s to 300s- will increase Lantus to 33 units BID 15. Anxiety/depression: Managed on Lexapro.  16. Constipation- LBM 5-6 days ago- ordered Mg citrate and Suppository  9/4- cleaned out- wil add Senokot 1 tab BID to keep him from becoming constipated  9/6- got cleaned out AGAIN- still cannonballs- will increase Colace (on senokot -S 1tab BID)  17. Foley/urinary retention - will d/c foley and get pt voiding hopefully.  9/5- still requiring in/out caths due to elevated volumes- will increase Flomax to 0.8 mg QHS to help him void.  9/6- not voiding- will check U/A and Cx since WBC is 12.6 and slowly rising, although no left shift- could be reason he's not voiding.  18. Insomnia- will give trazodone 50 mg QHS prn. 19. Itching- 9/4- will order cortisone lotion for back TID  9/5- helping 20. On Coumadin - INR is 1.9 - was 2.2/1.7- will stop daily checks when stable x 2 days  9/5- INR 2.4- will stop heparin gtt when INR gets >2.5- hopefully tomorrow  9/6- INR 2.4 again- pharmacy is titrating- will con't until INR 2.5, then sotp heparin gtt, 21. Dispo- will determine length of stay at team conference- likely 7-10 days    LOS: 4 days A FACE TO FACE EVALUATION WAS PERFORMED  Loyalty Brashier 08/09/2019, 10:17 AM

## 2019-08-09 NOTE — Progress Notes (Signed)
Occupational Therapy Session Note  Patient Details  Name: Joseph Hoffman MRN: [id number] Date of Birth: 1951/09/03  Today's Date: 08/09/2019 OT Individual Time: 2355-7322 OT Individual Time Calculation (min): 75 min    Short Term Goals: Week 1:  OT Short Term Goal 1 (Week 1): Pt will be able to transfer to [facility] and or toilet with min A using LRAD. OT Short Term Goal 2 (Week 1): Pt will be able to bathe LB with S OT Short Term Goal 3 (Week 1): Pt will be able to dress LB with min A. OT Short Term Goal 4 (Week 1): Pt will be able to maintain static stand for 1 minute with min A to pull clothing over hips.  Skilled Therapeutic Interventions/Progress Updates:    Pt received supine with c/o pain 4/10 in residual limb, described as burning. OOB mobility used as pain relief. Pt very friendly, VERY verbose, requiring redirection to task several times. Pt transitioned to sitting EOB with (S), pt c/o dizziness but all VSS. Pt completed slideboard transfer toward the L to the w/c with min A. Discussed d/c planning and shower transfers, including technique and equipment. Pt completed UB bathing sitting at the sink with set up assistance. Pt completed 2x sit <> stand at the sink with min cueing for UE placement, mod A to stand. Pt completed peri hygiene in standing with min A to wash thoroughly. Pt completed oral hygiene with set up assist. Pt donned gown 2/2 heparin drip unable to be disconnected. Edu re desensitization reinforced. Pt was left sitting up with all needs met, chair alarm set, RN present.   Therapy Documentation Precautions:  Precautions Precautions: Fall Precaution Comments: monitor HR and breathing  Restrictions Weight Bearing Restrictions: Yes LLE Weight Bearing: Non weight bearing Other Position/Activity Restrictions: L BKA   Therapy/Group: Individual Therapy  Curtis Sites 08/09/2019, 7:11 AM

## 2019-08-09 NOTE — Progress Notes (Addendum)
Pt continues to c/o 8/10 left leg pain, after medication administration, ice and applying pressure with object to the hamstring. Pt is sweating, and moaning due to the pain. Pt is refusing night bath b/c the pain is to bad. All VSS. Will continue to monitor.

## 2019-08-10 ENCOUNTER — Other Ambulatory Visit: Payer: Self-pay

## 2019-08-10 ENCOUNTER — Encounter (HOSPITAL_COMMUNITY): Payer: Self-pay | Admitting: *Deleted

## 2019-08-10 ENCOUNTER — Inpatient Hospital Stay (HOSPITAL_COMMUNITY): Payer: Medicare Other | Admitting: Occupational Therapy

## 2019-08-10 ENCOUNTER — Inpatient Hospital Stay (HOSPITAL_COMMUNITY): Payer: Medicare Other

## 2019-08-10 DIAGNOSIS — I5042 Chronic combined systolic (congestive) and diastolic (congestive) heart failure: Secondary | ICD-10-CM

## 2019-08-10 DIAGNOSIS — R7309 Other abnormal glucose: Secondary | ICD-10-CM

## 2019-08-10 DIAGNOSIS — N179 Acute kidney failure, unspecified: Secondary | ICD-10-CM

## 2019-08-10 DIAGNOSIS — G479 Sleep disorder, unspecified: Secondary | ICD-10-CM

## 2019-08-10 DIAGNOSIS — E871 Hypo-osmolality and hyponatremia: Secondary | ICD-10-CM

## 2019-08-10 DIAGNOSIS — D649 Anemia, unspecified: Secondary | ICD-10-CM

## 2019-08-10 DIAGNOSIS — E1142 Type 2 diabetes mellitus with diabetic polyneuropathy: Secondary | ICD-10-CM

## 2019-08-10 DIAGNOSIS — D72829 Elevated white blood cell count, unspecified: Secondary | ICD-10-CM

## 2019-08-10 DIAGNOSIS — M791 Myalgia, unspecified site: Secondary | ICD-10-CM

## 2019-08-10 LAB — CBC WITH DIFFERENTIAL/PLATELET
Abs Immature Granulocytes: 0.38 10*3/uL — ABNORMAL HIGH (ref 0.00–0.07)
Basophils Absolute: 0.1 10*3/uL (ref 0.0–0.1)
Basophils Relative: 1 %
Eosinophils Absolute: 0.3 10*3/uL (ref 0.0–0.5)
Eosinophils Relative: 2 %
HCT: 28.5 % — ABNORMAL LOW (ref 39.0–52.0)
Hemoglobin: 9 g/dL — ABNORMAL LOW (ref 13.0–17.0)
Immature Granulocytes: 3 %
Lymphocytes Relative: 8 %
Lymphs Abs: 1.1 10*3/uL (ref 0.7–4.0)
MCH: 26.6 pg (ref 26.0–34.0)
MCHC: 31.6 g/dL (ref 30.0–36.0)
MCV: 84.3 fL (ref 80.0–100.0)
Monocytes Absolute: 0.9 10*3/uL (ref 0.1–1.0)
Monocytes Relative: 6 %
Neutro Abs: 12 10*3/uL — ABNORMAL HIGH (ref 1.7–7.7)
Neutrophils Relative %: 80 %
Platelets: 270 10*3/uL (ref 150–400)
RBC: 3.38 MIL/uL — ABNORMAL LOW (ref 4.22–5.81)
RDW: 18.5 % — ABNORMAL HIGH (ref 11.5–15.5)
WBC: 14.8 10*3/uL — ABNORMAL HIGH (ref 4.0–10.5)
nRBC: 0 % (ref 0.0–0.2)

## 2019-08-10 LAB — URINE CULTURE: Culture: NO GROWTH

## 2019-08-10 LAB — BASIC METABOLIC PANEL
Anion gap: 10 (ref 5–15)
BUN: 50 mg/dL — ABNORMAL HIGH (ref 8–23)
CO2: 24 mmol/L (ref 22–32)
Calcium: 9 mg/dL (ref 8.9–10.3)
Chloride: 96 mmol/L — ABNORMAL LOW (ref 98–111)
Creatinine, Ser: 1.84 mg/dL — ABNORMAL HIGH (ref 0.61–1.24)
GFR calc Af Amer: 42 mL/min — ABNORMAL LOW (ref >60)
GFR calc non Af Amer: 36 mL/min — ABNORMAL LOW (ref >60)
Glucose, Bld: 243 mg/dL — ABNORMAL HIGH (ref 70–99)
Potassium: 4.3 mmol/L (ref 3.5–5.1)
Sodium: 130 mmol/L — ABNORMAL LOW (ref 135–145)

## 2019-08-10 LAB — PROTIME-INR
INR: 2.9 — ABNORMAL HIGH (ref 0.8–1.2)
Prothrombin Time: 29.7 seconds — ABNORMAL HIGH (ref 11.4–15.2)

## 2019-08-10 LAB — HEPARIN LEVEL (UNFRACTIONATED): Heparin Unfractionated: 0.4 IU/mL (ref 0.30–0.70)

## 2019-08-10 LAB — GLUCOSE, CAPILLARY
Glucose-Capillary: 224 mg/dL — ABNORMAL HIGH (ref 70–99)
Glucose-Capillary: 191 mg/dL — ABNORMAL HIGH (ref 70–99)
Glucose-Capillary: 213 mg/dL — ABNORMAL HIGH (ref 70–99)
Glucose-Capillary: 206 mg/dL — ABNORMAL HIGH (ref 70–99)

## 2019-08-10 MED ORDER — WARFARIN SODIUM 7.5 MG PO TABS
12.5000 mg | ORAL_TABLET | Freq: Every day | ORAL | Status: DC
  Administered 2019-08-10: 12.5 mg via ORAL
  Filled 2019-08-10: qty 1

## 2019-08-10 MED ORDER — METHOCARBAMOL 750 MG PO TABS
750.0000 mg | ORAL_TABLET | Freq: Four times a day (QID) | ORAL | Status: DC
  Administered 2019-08-10 – 2019-08-12 (×8): 750 mg via ORAL
  Filled 2019-08-10 (×8): qty 1

## 2019-08-10 MED ORDER — BETHANECHOL CHLORIDE 10 MG PO TABS
5.0000 mg | ORAL_TABLET | Freq: Three times a day (TID) | ORAL | Status: DC
  Administered 2019-08-10 – 2019-08-11 (×3): 5 mg via ORAL
  Filled 2019-08-10 (×3): qty 1

## 2019-08-10 NOTE — Progress Notes (Signed)
Occupational Therapy Session Note  Patient Details  Name: IBUKUNOLUWA SURACE MRN: [id number] Date of Birth: June 19, 1951  Today's Date: 08/10/2019 OT Individual Time: 0815-0900 OT Individual Time Calculation (min): 45 min  and Today's Date: 08/10/2019 OT Missed Time: 30 Minutes Missed Time Reason: Pain   Short Term Goals: Week 1:  OT Short Term Goal 1 (Week 1): Pt will be able to transfer to [facility] and or toilet with min A using LRAD. OT Short Term Goal 2 (Week 1): Pt will be able to bathe LB with S OT Short Term Goal 3 (Week 1): Pt will be able to dress LB with min A. OT Short Term Goal 4 (Week 1): Pt will be able to maintain static stand for 1 minute with min A to pull clothing over hips.  Skilled Therapeutic Interventions/Progress Updates:    Pt resting in bed upon arrival with apparent severe pain and grimacing.  Pt states he was not able to sleep during the night secondary to pain.  Pt states he can't do anything until pain gets better.  Pt c/o severe pain in L hamstring (more lateral). MD attended and checked LLE incision.  Curlex reapplied and Ace wraps (per MD) to mid thigh. Pt instructed to keep knee extended as much as possible. Trigger point and deep tissue mobilizations for pain relief in addition to LLE stretching provided. Pt unable to actively participate in BADLs or bed mobility 2/2 paiin.  RN notified and attended to pt with medications.  Pt remained in bed with all needs within reach and RN present.   Therapy Documentation Precautions:  Precautions Precautions: Fall Precaution Comments: monitor HR and breathing  Restrictions Weight Bearing Restrictions: Yes LLE Weight Bearing: Non weight bearing Other Position/Activity Restrictions: L BKA General: General OT Amount of Missed Time: 30 Minutes Pain:  Pt c/o "15/10" pain in L hamstring and "some pain" on medial patella; repositioned, deep tissue massage; heat   Therapy/Group: Individual Therapy  [name] 08/10/2019, 9:20 AM

## 2019-08-10 NOTE — Progress Notes (Signed)
Physical Therapy Session Note  Patient Details  Name: Joseph Hoffman MRN: [id number] Date of Birth: [birth year]  Today's Date: 08/10/2019 PT Individual Time: 1315-1400 PT Individual Time Calculation (min): 45 min   Short Term Goals: Week 1:  PT Short Term Goal 1 (Week 1): Pt will initiate gait training PT Short Term Goal 2 (Week 1): Pt will perform bed<>chair transfer w/ mod assist PT Short Term Goal 3 (Week 1): Pt will tolerate sitting OOB in between therapy sessions for 2 hours PT Short Term Goal 4 (Week 1): Pt will self-propel w/c 150' w/ supervision  Skilled Therapeutic Interventions/Progress Updates:     Patient in bed asleep upon PT arrival. Patient easily aroused and reported that he needed to use the urinal. PT provided assistance with placing the urinal, however, he did not have any success. PT suggested that he try the [facility]. Patient was frustrated and stated he thought he would need an in/out catheter due to being unsuccessful with voiding on is own. RN made aware and performed bladder scan and in/out cath during session. Missed 15 min of skilled PT due to nursing care. Following the patient stated that he did not sleep at all last night due to L residual limb pain and muscle spasms and he did not want to get out of the bed due to 3/10 pain at rest and 7/10 pain with spasms in L residual limb, but he was agreeable to participating in residual limb education and L LE soft tissue mobilization and other pain management techniques for his residual limb.   PT educated on positioning in bed, promoting supine and prone positions over side lying to promote L knee extension to prevent contractures. Provided education on signs/symptoms of infection, pressure relief, edema control and limb wrapping, promotion of activity including swimming when cleared by the doctor, performing B limb and R foot checks daily, and promotion of healthy nutritional and fluid choices for wound healing. Patient was  receptive to all education provided and would benefit from reinforcement throughout stay.   PT doffed L ACE wraps and had the patient roll to prone with verbal cues for technique with supervision. In prone, PT provided soft tissue mobilization to L hamstrings for reduced muscle spasm and pain relief. Provided guided imagery and desensitization for pain relief and education on how to perform those independently in the room.   PT re-wrapped his residual limb with 1-4" ACE wrap and 1-6" ACE wrap up 3/4 of his L thigh for edema control. Provided education on wrapping purpose, benefits, and technique during.   Patient in bed at end of session with breaks locked, bed alarm set, and all needs within reach.    Therapy Documentation Precautions:  Precautions Precautions: Fall Precaution Comments: monitor HR and breathing  Restrictions Weight Bearing Restrictions: Yes LLE Weight Bearing: Non weight bearing Other Position/Activity Restrictions: L BKA General: PT Amount of Missed Time (min): 15 Minutes PT Missed Treatment Reason: Toileting;Nursing care(In/Out Catheter)    Therapy/Group: Individual Therapy  Natlie Asfour L Donal Lynam PT, DPT  08/10/2019, 4:37 PM

## 2019-08-10 NOTE — Progress Notes (Signed)
Bed alarm went off in patient room and NT went and found patient kneeling at bedside. NT called for assistance so this RN, another NT, and RN went to the room and found the patient in this position. The patient had no idea that he was on the floor but he knew how he got there. He stated that he was trying to roll over in the bed to reach a prone position in order to stretch out his left thigh muscle. The occupational therapist had showed him this position earlier in the day. There were only 3 side rails up at the time, so there was not enough bed to prevent him from rolling out of the bed. The patient stated that he had no pain and that he found the whole situation quite funny. Will continue to monitor the patient.

## 2019-08-10 NOTE — Progress Notes (Signed)
Pt c/o 10/10 pain to his left leg, requesting a heat pad for his leg. Increased pain when palpating the knee cap. NORCO was given to decrease pain. Will continue to monitor pt.

## 2019-08-10 NOTE — Progress Notes (Signed)
ANTICOAGULATION CONSULT NOTE - Follow Up Consult  Pharmacy Consult for Coumadin Indication: Mech AVR  Allergies  Allergen Reactions  . Iohexol Anaphylaxis  . Niacin And Related     Flushing with immediate realese  . Penicillins Other (See Comments)    Unknown.Joseph Hoffman a child  Did it involve swelling of the face/tongue/throat, SOB, or low BP? Unknown Did it involve sudden or severe rash/hives, skin peeling, or any reaction on the inside of your mouth or nose? Unknown Did you need to seek medical attention at a hospital or doctor's office? Unknown When did it last happen?Childhood If all above answers are "NO", may proceed with cephalosporin use.    Patient Measurements: Height: 5\' 11"  (180.3 cm) Weight: 245 lb 9.5 oz (111.4 kg) IBW/kg (Calculated) : 75.3   Vital Signs: Temp: 97.8 F (36.6 C) (09/07 0627) Temp Source: Oral (09/07 0627) BP: 140/57 (09/07 0627) Pulse Rate: 87 (09/07 0753)  Labs: Recent Labs    08/08/19 0502 08/09/19 0356 08/09/19 1924 08/10/19 0341  HGB  --  8.7*  --  9.0*  HCT  --  28.5*  --  28.5*  PLT  --  242  --  270  LABPROT 25.6* 26.1*  --  29.7*  INR 2.4* 2.4*  --  2.9*  HEPARINUNFRC 0.59 0.74* 0.67 0.40  CREATININE  --  1.76*  --  1.84*    Estimated Creatinine Clearance: 47.4 mL/min (A) (by C-G formula based on SCr of 1.84 mg/dL (H)).   Assessment: Anticoag: Heparin/warfarin for mech AVR HL 0.4  goal. INR 2.9, Hgb 9, plts WNL, no bleeding noted  (PTA 10 mg daily except 12.5mg  TTS with admit INR 2.7 (goal 2.5-3.5))  Goal of Therapy:  INR 2.5-3.5 Monitor platelets by anticoagulation protocol: Yes   Plan:  D/c IV heparin Coumadin 12.5mg  po daily. Daily INR  Joseph Hoffman Joseph Hoffman [place], PharmD, BCPS Clinical Staff Pharmacist Eilene Ghazi Stillinger 08/10/2019,8:18 AM

## 2019-08-10 NOTE — Progress Notes (Signed)
Occupational Therapy Session Note  Patient Details  Name: Joseph Hoffman MRN: [id number] Date of Birth: 1951/06/23  Today's Date: 08/10/2019 OT Individual Time: 1100-1135 OT Individual Time Calculation (min): 35 min  and Today's Date: 08/10/2019 OT Missed Time: 25 Minutes Missed Time Reason: Patient fatigue   Short Term Goals: Week 1:  OT Short Term Goal 1 (Week 1): Pt will be able to transfer to [facility] and or toilet with min A using LRAD. OT Short Term Goal 2 (Week 1): Pt will be able to bathe LB with S OT Short Term Goal 3 (Week 1): Pt will be able to dress LB with min A. OT Short Term Goal 4 (Week 1): Pt will be able to maintain static stand for 1 minute with min A to pull clothing over hips.  Skilled Therapeutic Interventions/Progress Updates:     Pt received in bed stating last night and this morning have been very difficulty as his pain has been unrelenting and he has not been able to tolerate any out of bed activity or even in bed self care. Pt agreeable to moving into prone to enable me to do deep tissue massage and myofascial release along with stretching and ROM.   Pt slid to one edge of bed and rolled into prone with pillow positioned under R foot and head for support.   Spent a great deal of time on deep tissue work on L hamstring which pt tolerated well and stated his leg was feeling considerably better.  Passive stretching and MFR with pushing lower limb down to achieve full knee extension, hip abd/ adduction, and hip extension for hip flexor stretch. Pt is very tight in his hip.   Pt became so relaxed that he fell asleep.  As I adjusted the bed down to the lowest level, he woke slightly and said he was so relaxed and wanted to stay in that position to sleep as he has not had a good nights rest in several days.  All 4 bedrails put up for safety as he was in prone.  Reported to RN, LPN, and NT on patient's status.  Bed alarm set and call light in reach.    Therapy  Documentation Precautions:  Precautions Precautions: Fall Precaution Comments: monitor HR and breathing  Restrictions Weight Bearing Restrictions: Yes LLE Weight Bearing: Non weight bearing Other Position/Activity Restrictions: L BKA    Vital Signs: Therapy Vitals Temp: 97.8 F (36.6 C) Temp Source: Oral Pulse Rate: 87 Resp: 16 BP: (!) 140/57 Patient Position (if appropriate): Lying Oxygen Therapy SpO2: 96 % O2 Device: Room Air   Pain:  AT START OF SESSION 10/10 IN L LIMB, AFTER THERAPY PT RELAXED, STATING NO PAIN AND ABLE TO FALL ASLEEP      Therapy/Group: Individual Therapy  [place] 08/10/2019, 8:52 AM

## 2019-08-10 NOTE — Progress Notes (Signed)
Pt continues to c/o of 15/10  LLE (posterior) pain. This nurse medicated at 2230 and had no relief. Pt called at 2345, c/o of 25/10 pain, stating " this is the worst pain ive ever felt". Pt was medicated with PRN Oxy, placed in the prone position with pillow under the head, K pad placed on the hamstring, CPAP in place, and call light in reach.

## 2019-08-10 NOTE — Progress Notes (Addendum)
[facility] PHYSICAL MEDICINE & REHABILITATION PROGRESS NOTE   Subjective/Complaints: Patient seen laying in bed working with therapies.  He initially states he is doing well, however when prompted by therapies, patient states that he is in severe pain he states he did not sleep well overnight due to cramping leg pain.  He has a history of tight hamstrings.  Discussed stump dressing with therapies.  ROS: +Myalgia.  Denies CP, shortness of breath, nausea, vomiting, diarrhea.  Objective:   No results found. Recent Labs    08/09/19 0356 08/10/19 0341  WBC 12.6* 14.8*  HGB 8.7* 9.0*  HCT 28.5* 28.5*  PLT 242 270   Recent Labs    08/09/19 0356 08/10/19 0341  NA 133* 130*  K 4.3 4.3  CL 96* 96*  CO2 27 24  GLUCOSE 181* 243*  BUN 51* 50*  CREATININE 1.76* 1.84*  CALCIUM 9.1 9.0    Intake/Output Summary (Last 24 hours) at 08/10/2019 1006 Last data filed at 08/10/2019 0630 Gross per 24 hour  Intake 600.04 ml  Output 3050 ml  Net -2449.96 ml     Physical Exam: Vital Signs Blood pressure (!) 140/57, pulse 87, temperature 97.8 F (36.6 C), temperature source Oral, resp. rate 16, height 5\' 11"  (1.803 m), weight 111.4 kg, SpO2 96 %. Constitutional: No distress . Vital signs reviewed. HENT: Normocephalic.  Atraumatic. Eyes: EOMI. No discharge. Cardiovascular: No JVD. Respiratory: Normal effort.  No stridor. GI: Non-distended. Skin: Warm and dry.  Intact. Psych: Normal mood.  Normal behavior. Musc: Left stump with edema and tenderness.  Tender to palpation left hamstrings Neurological: He is alert and oriented Motor: B/l UE, RLE: 5/5 proximal to distal, unchanged LLE: HF 3/5 (pain inhibition), unchanged RLE: 5/5 proximal distal Sensation grossly intact to light touch  Skin: He is not diaphoretic.  Left stump with erythema and edema.  Central incision with dark area. Psychiatric: He has a normal mood and affect. His behavior is normal.    Assessment/Plan: 1. Functional  deficits secondary to L BKA which require 3+ hours per day of interdisciplinary therapy in a comprehensive inpatient rehab setting.  Physiatrist is providing close team supervision and 24 hour management of active medical problems listed below.  Physiatrist and rehab team continue to assess barriers to discharge/monitor patient progress toward functional and medical goals  Care Tool:  Bathing    Body parts bathed by patient: Right arm, Left arm, Chest, Abdomen, Front perineal area, Right upper leg, Left upper leg, Face, Right lower leg   Body parts bathed by helper: Buttocks Body parts n/a: Left lower leg   Bathing assist Assist Level: Minimal Assistance - Patient > 75%     Upper Body Dressing/Undressing Upper body dressing   What is the patient wearing?: Hospital gown only    Upper body assist Assist Level: Set up assist    Lower Body Dressing/Undressing Lower body dressing      What is the patient wearing?: Underwear/pull up, Pants     Lower body assist Assist for lower body dressing: Moderate Assistance - Patient 50 - 74%     Toileting Toileting    Toileting assist Assist for toileting: Moderate Assistance - Patient 50 - 74%     Transfers Chair/bed transfer  Transfers assist     Chair/bed transfer assist level: Minimal Assistance - Patient > 75%(slideboard)     Locomotion Ambulation   Ambulation assist   Ambulation activity did not occur: Safety/medical concerns  Walk 10 feet activity   Assist  Walk 10 feet activity did not occur: Safety/medical concerns        Walk 50 feet activity   Assist Walk 50 feet with 2 turns activity did not occur: Safety/medical concerns         Walk 150 feet activity   Assist Walk 150 feet activity did not occur: Safety/medical concerns         Walk 10 feet on uneven surface  activity   Assist Walk 10 feet on uneven surfaces activity did not occur: Safety/medical concerns          Wheelchair     Assist     Wheelchair activity did not occur: (pain)         Wheelchair 50 feet with 2 turns activity    Assist            Wheelchair 150 feet activity     Assist          Blood pressure (!) 140/57, pulse 87, temperature 97.8 F (36.6 C), temperature source Oral, resp. rate 16, height 5\' 11"  (1.803 m), weight 111.4 kg, SpO2 96 %.   CBG (last 3)  Recent Labs    08/09/19 1622 08/09/19 2106 08/10/19 0623  GLUCAP 118* 271* 224*    Medical Problem List and Plan: 1.  Deficits with mobility, transfers, self-care secondary to left BKA.  Continue CIR 2.  Mechanical AVR/Antithrombotics: -DVT/anticoagulation:  Pharmaceutical: Coumadin             -antiplatelet therapy: N/A  On heparin GGT this a.m., likely DC soon 3. Pain Management: Severe hamstring pain  Continue Norco as needed  Valium changed to scheduled Robaxin on 9/7  Educated patient on avoiding knee flexion             Monitor with increased mobility 4. Mood: LCSW to follow for evaluation and support.              -antipsychotic agents: N/A 5. Neuropsych: This patient is capable of making decisions on his own behalf. 6. Skin/Wound Care:   Monitor wound for healing and signs and symptoms of infection.   Kerlix with Ace wrap as ordered to stump  Stump protector ordered 7. Fluids/Electrolytes/Nutrition: Strict I/Os.  8. CAD/Systolic CHF: Added HH restrictions to diet. On Lanoxin, Metoprolol and Demadex.              Monitor for signs/symptoms of fluid overload             Daily weights Filed Weights   08/05/19 1617 08/08/19 0609 08/09/19 0315  Weight: 115.3 kg 110.7 kg 111.4 kg   Relatively stable on 9/7 9. Acute on chronic renal failure:   Creatinine 1.84 on 9/7, labs ordered for tomorrow  Encourage fluids 10 Leucocytosis: Monitor wound, trend and for other signs of infection.              WBCs 14.8 on 9/7, labs ordered for tomorrow  Monitor stump closely for  infection  Afebrile  UA unremarkable, urine culture no growth 11. Acute on chronic anemia:             Hemoglobin 9.2 on 9/7 12. COPD: Respiratory status stable on Dulera 13. OSA: Has been compliant with CPAP at nights.  14. T2DM with neuropathy: On Lantus and amaryl with BS reasonable. Monitor BS ac/hs             Monitor with increased mobility  Increase Lantus to 33 units  BID on 9/7  Labile on 9/7 15. Anxiety/depression: Managed on Lexapro.  16. Constipation-   Added Senokot 1 tab BID   Increased Colace  17. Foley/urinary retention  increased Flomax to 0.8 mg QHS to help him void.  Bethanechol 5 3 times daily started on 9/7  Continues to require I/O cath 18.  Sleep disturbance- will give trazodone 50 mg QHS prn. 19. Itching- 9/4-ordered cortisone lotion for back TID  Improving 20.  Hyponatremia  Sodium 130 on 9/7, labs ordered for tomorrow  Continue to monitor  LOS: 5 days A FACE TO FACE EVALUATION WAS PERFORMED  Catalina Salasar Lorie Phenix 08/10/2019, 10:06 AM

## 2019-08-11 ENCOUNTER — Inpatient Hospital Stay (HOSPITAL_COMMUNITY): Payer: Medicare Other

## 2019-08-11 LAB — CBC WITH DIFFERENTIAL/PLATELET
Abs Immature Granulocytes: 0.18 10*3/uL — ABNORMAL HIGH (ref 0.00–0.07)
Basophils Absolute: 0.1 10*3/uL (ref 0.0–0.1)
Basophils Relative: 0 %
Eosinophils Absolute: 0.8 10*3/uL — ABNORMAL HIGH (ref 0.0–0.5)
Eosinophils Relative: 6 %
HCT: 27.7 % — ABNORMAL LOW (ref 39.0–52.0)
Hemoglobin: 8.6 g/dL — ABNORMAL LOW (ref 13.0–17.0)
Immature Granulocytes: 1 %
Lymphocytes Relative: 7 %
Lymphs Abs: 1 10*3/uL (ref 0.7–4.0)
MCH: 26.5 pg (ref 26.0–34.0)
MCHC: 31 g/dL (ref 30.0–36.0)
MCV: 85.5 fL (ref 80.0–100.0)
Monocytes Absolute: 1.1 10*3/uL — ABNORMAL HIGH (ref 0.1–1.0)
Monocytes Relative: 8 %
Neutro Abs: 10.9 10*3/uL — ABNORMAL HIGH (ref 1.7–7.7)
Neutrophils Relative %: 78 %
Platelets: 270 10*3/uL (ref 150–400)
RBC: 3.24 MIL/uL — ABNORMAL LOW (ref 4.22–5.81)
RDW: 18.8 % — ABNORMAL HIGH (ref 11.5–15.5)
WBC: 13.9 10*3/uL — ABNORMAL HIGH (ref 4.0–10.5)
nRBC: 0 % (ref 0.0–0.2)

## 2019-08-11 LAB — GLUCOSE, CAPILLARY
Glucose-Capillary: 169 mg/dL — ABNORMAL HIGH (ref 70–99)
Glucose-Capillary: 212 mg/dL — ABNORMAL HIGH (ref 70–99)
Glucose-Capillary: 171 mg/dL — ABNORMAL HIGH (ref 70–99)
Glucose-Capillary: 238 mg/dL — ABNORMAL HIGH (ref 70–99)

## 2019-08-11 LAB — BASIC METABOLIC PANEL WITH GFR
Anion gap: 10 (ref 5–15)
BUN: 41 mg/dL — ABNORMAL HIGH (ref 8–23)
CO2: 27 mmol/L (ref 22–32)
Calcium: 9.2 mg/dL (ref 8.9–10.3)
Chloride: 98 mmol/L (ref 98–111)
Creatinine, Ser: 1.47 mg/dL — ABNORMAL HIGH (ref 0.61–1.24)
GFR calc Af Amer: 55 mL/min — ABNORMAL LOW
GFR calc non Af Amer: 48 mL/min — ABNORMAL LOW
Glucose, Bld: 186 mg/dL — ABNORMAL HIGH (ref 70–99)
Potassium: 4.2 mmol/L (ref 3.5–5.1)
Sodium: 135 mmol/L (ref 135–145)

## 2019-08-11 LAB — PROTIME-INR
INR: 3.7 — ABNORMAL HIGH (ref 0.8–1.2)
Prothrombin Time: 36.2 seconds — ABNORMAL HIGH (ref 11.4–15.2)

## 2019-08-11 MED ORDER — WARFARIN SODIUM 7.5 MG PO TABS
7.5000 mg | ORAL_TABLET | Freq: Once | ORAL | Status: AC
  Administered 2019-08-11: 7.5 mg via ORAL
  Filled 2019-08-11: qty 1

## 2019-08-11 MED ORDER — BETHANECHOL CHLORIDE 10 MG PO TABS
10.0000 mg | ORAL_TABLET | Freq: Four times a day (QID) | ORAL | Status: DC
  Administered 2019-08-11 – 2019-08-12 (×7): 10 mg via ORAL
  Filled 2019-08-11 (×7): qty 1

## 2019-08-11 NOTE — Progress Notes (Signed)
Orthopedic Tech Progress Note Patient Details:  Joseph Hoffman 17-Jan-1951 [id number] Called in order to [facility] for a STUMP PROTECTOR  Patient ID: [name], male   DOB: 04-Aug-1951, 68 y.o.   MRN: [id number]   [name] 08/11/2019, 8:44 AM

## 2019-08-11 NOTE — Progress Notes (Signed)
Physical Therapy Session Note  Patient Details  Name: Joseph Hoffman MRN: [id number] Date of Birth: 1951-05-03  Today's Date: 08/11/2019 PT Individual Time: [id number] PT Individual Time Calculation (min): 30 min   Short Term Goals: Week 1:  PT Short Term Goal 1 (Week 1): Pt will initiate gait training PT Short Term Goal 2 (Week 1): Pt will perform bed<>chair transfer w/ mod assist PT Short Term Goal 3 (Week 1): Pt will tolerate sitting OOB in between therapy sessions for 2 hours PT Short Term Goal 4 (Week 1): Pt will self-propel w/c 150' w/ supervision  Skilled Therapeutic Interventions/Progress Updates:     Session 1: Patient in bed finishing lunch upon PT arrival. Patient alert and agreeable to PT session. Patient reported 9/10 L residual limb pain and muscle spasms during session, RN made aware and provided a muscle relaxer during session. PT provided repositioning, rest breaks, and distraction as pain interventions throughout session. PT re-wrapped the patient's residual limb with 2-4" ACE wraps from the end of his limb up to 3/4 up his thigh for edema control. Educated patient on wrapping technique and importance of shaping and edema control. Patient was receptive to education.   Therapeutic Activity: Bed Mobility: Patient performed supine to sit with supervision with heavy use of bed rails in a flat bed. Provided verbal cues for rolling on his R side then pushing to sit up. Transfers: Patient performed a slide board transfer with min-mod A and total a for board placement. Provided verbal cues for head hips relationship, hand placement, board placement, and lifting his bottom rather than sliding across the borad. Patient with minor pain in his scrotum during transfer due to limited clothing as patient was in a hospital gown and loose fitting boxer briefs, to prevent friction during transfer. No signs of redness and pain resolved after transfer. PT educated on having family bring shorts or  pants for him to wear during therapy sessions, patient in agreement.   Manual Therapy: Provided soft tissue mobilization and trigger point release to L hamstrings followed by a prolonged knee extension stretch 2x1 min with gentle manual overpressure in supine for pain management and reduction of muscle spasms.   Therapeutic Exercise: Patient performed the following exercises with verbal and tactile cues for proper technique. -hip flexion in supine x5 -quad sets x5, limited by muscle spasms in hamstrings -glut sets x10 with 5 sec hold -SLR x5 with mild knee flexion to prevent muscle spasms  Patient in w/c at end of session with breaks locked, chair alarm set, and all needs within reach.   Session 2: Patient in bed upon PT arrival. Patient alert and agreeable to PT session. Patient reported 25/10 L residual limb pain during session including burning from his gluts down the back of his leg and muscle spasms in his hamstrings, RN made aware and provided pain medicine prior to session. PT provided repositioning, rest breaks, and distraction as pain interventions throughout session.   Therapeutic Activity: Bed Mobility: Patient performed rolling supine to/from prone with supervision and heavy use of bed rails with 4 rails up for safety. Provided verbal cues for reaching with his L UE and LE to roll to the R and scooting back in side lying to make room to roll to prone.  Manual Therapy: Provided soft tissue mobilization and trigger point release to L hamstrings and gluteal muscles for pain relief and to reduce muscle spasms. Patient reported 7/10 pain with spasms and burning/numbness from his posterior gluteal region to  behind his knee after. PT noted several trigger points in is periformis and gluteus maximus musculature.   Therapeutic Exercise: Patient performed the following exercises with verbal and tactile cues for proper technique. -L Piriformis stretch with hip flexion and external rotation  in supine with manual over pressure 2x1 min -L knee extension stretch in prone with gentle manual overpressure to ischial tuberosity and posterior tibia to stretch hamstrings  Patient in bed at end of session with breaks locked, bed alarm set, 4 rails up, and all needs within reach.    Therapy Documentation Precautions:  Precautions Precautions: Fall Precaution Comments: monitor HR and breathing  Restrictions Weight Bearing Restrictions: Yes LLE Weight Bearing: Non weight bearing Other Position/Activity Restrictions: L BKA    Therapy/Group: Individual Therapy  Joseph Hoffman L Joseph Hoffman PT, DPT  08/11/2019, 5:13 PM

## 2019-08-11 NOTE — Progress Notes (Signed)
Physical Therapy Session Note  Patient Details  Name: Joseph Hoffman MRN: [id number] Date of Birth: [birth year]  Today's Date: 08/11/2019 PT Individual Time: 0800-0825 PT Individual Time Calculation (min): 25 min   Short Term Goals: Week 1:  PT Short Term Goal 1 (Week 1): Pt will initiate gait training PT Short Term Goal 2 (Week 1): Pt will perform bed<>chair transfer w/ mod assist PT Short Term Goal 3 (Week 1): Pt will tolerate sitting OOB in between therapy sessions for 2 hours PT Short Term Goal 4 (Week 1): Pt will self-propel w/c 150' w/ supervision  Skilled Therapeutic Interventions/Progress Updates:    Pt sleeping hard but agreeable once aroused. Pt donned pants with set up assist in supine and able to roll with use of bed rails with supervision. Bed mobility retraining with supervision to come to EOB and extra time with cues for increased efficient technique using RUE to assist to push up vs momentum. PT placed slideboard and pt able to perform slideboard transfer to w/c with min assist and verbal and tactile cues for technique and weightshifting. PT educated on residual limb wrapping with ACE wrap every 3-4 hours and that he will likely get a shrinker soon (RN reports it may be today). PT unwrapped and rewrapped limb while RN performed skin inspection with some drainage noted. Pt verbalized understanding and left with RN administering medications.   Therapy Documentation Precautions:  Precautions Precautions: Fall Precaution Comments: monitor HR and breathing  Restrictions Weight Bearing Restrictions: Yes LLE Weight Bearing: Non weight bearing Other Position/Activity Restrictions: L BKA Pain:  No complaints of pain currently.   Therapy/Group: Individual Therapy  Canary Brim Ivory Broad, PT, DPT, CBIS  08/11/2019, 8:29 AM

## 2019-08-11 NOTE — Progress Notes (Signed)
Occupational Therapy Session Note  Patient Details  Name: Joseph Hoffman MRN: [id number] Date of Birth: 05/03/1951  Today's Date: 08/11/2019 OT Individual Time: 0930-1040 OT Individual Time Calculation (min): 70 min    Short Term Goals: Week 1:  OT Short Term Goal 1 (Week 1): Pt will be able to transfer to [facility] and or toilet with min A using LRAD. OT Short Term Goal 2 (Week 1): Pt will be able to bathe LB with S OT Short Term Goal 3 (Week 1): Pt will be able to dress LB with min A. OT Short Term Goal 4 (Week 1): Pt will be able to maintain static stand for 1 minute with min A to pull clothing over hips.  Skilled Therapeutic Interventions/Progress Updates:    Pt resting in w/c upon arrival.  Pt commented that he was in excrutiating pain (L ham string).  Biotech rep entered room with Limb Guard.  Biotech rep educated pt on purpose of device.  Pt transferred to bed with sliding board requiring min A. Pt rolled in bed to prone position for L hamstring MFR and deep trigger point therapy.  Pt with multiple trigger points and myofascial restrictions.  Pt reported some relief and tissue more pliable at end of session. Pt returned to supine with increased L knee extension noted. Pt remained in bed with all needs within reach and 4 side rails up for safety.   Therapy Documentation Precautions:  Precautions Precautions: Fall Precaution Comments: monitor HR and breathing  Restrictions Weight Bearing Restrictions: Yes LLE Weight Bearing: Non weight bearing Other Position/Activity Restrictions: L BKA Pain:  "excrutiating"; L hamstrings; soft tissue myofascial release   Therapy/Group: Individual Therapy  [name] 08/11/2019, 10:40 AM

## 2019-08-11 NOTE — Progress Notes (Signed)
ANTICOAGULATION CONSULT NOTE - Follow Up Consult  Pharmacy Consult for Coumadin Indication: Mech AVR  Allergies  Allergen Reactions  . Iohexol Anaphylaxis  . Niacin And Related     Flushing with immediate realese  . Penicillins Other (See Comments)    Unknown.[name]  Did it involve swelling of the face/tongue/throat, SOB, or low BP? Unknown Did it involve sudden or severe rash/hives, skin peeling, or any reaction on the inside of your mouth or nose? Unknown Did you need to seek medical attention at a hospital or doctor's office? Unknown When did it last happen?Childhood If all above answers are "NO", may proceed with cephalosporin use.    Patient Measurements: Height: 5\' 11"  (180.3 cm) Weight: 239 lb 13.8 oz (108.8 kg) IBW/kg (Calculated) : 75.3   Vital Signs: Temp: 97.7 F (36.5 C) (09/08 1418) Temp Source: Oral (09/08 1418) BP: 126/62 (09/08 1418) Pulse Rate: 83 (09/08 1418)  Labs: Recent Labs    08/09/19 0356 08/09/19 1924 08/10/19 0341 08/11/19 0421  HGB 8.7*  --  9.0* 8.6*  HCT 28.5*  --  28.5* 27.7*  PLT 242  --  270 270  LABPROT 26.1*  --  29.7* 36.2*  INR 2.4*  --  2.9* 3.7*  HEPARINUNFRC 0.74* 0.67 0.40  --   CREATININE 1.76*  --  1.84* 1.47*    Estimated Creatinine Clearance: 58.7 mL/min (A) (by C-G formula based on SCr of 1.47 mg/dL (H)).   Assessment: Anticoag: Heparin drip discontinued 9/7.  Continues on [place] for mech AVR , goal INR 2.5-3.5.  Today the INR is 3.7, slightly supratherapeutic,  INR up from 2.9 yesterday.   Hgb 8.6 low/stable,  plts WNL, no bleeding noted  (PTA 10 mg daily except 12.5mg  TTS with admit INR 2.7 (goal 2.5-3.5))  Goal of Therapy:  INR 2.5-3.5 Monitor platelets by anticoagulation protocol: Yes   Plan:  Coumadin 7.5 mg po daily. Daily INR  Nicole Cella, RPh Clinical Pharmacist 08/11/2019,4:18 PM

## 2019-08-11 NOTE — Progress Notes (Signed)
[facility] PHYSICAL MEDICINE & REHABILITATION PROGRESS NOTE   Subjective/Complaints: Patient reports he just wants to sleep- didn't sleep well overnight and just wants to keep sleeping this AM- asked me, politely, to leave him alone.  ROS: +Myalgia.  Denies CP, shortness of breath, nausea, vomiting, diarrhea.  Objective:   No results found. Recent Labs    08/10/19 0341 08/11/19 0421  WBC 14.8* 13.9*  HGB 9.0* 8.6*  HCT 28.5* 27.7*  PLT 270 270   Recent Labs    08/10/19 0341 08/11/19 0421  NA 130* 135  K 4.3 4.2  CL 96* 98  CO2 24 27  GLUCOSE 243* 186*  BUN 50* 41*  CREATININE 1.84* 1.47*  CALCIUM 9.0 9.2    Intake/Output Summary (Last 24 hours) at 08/11/2019 1625 Last data filed at 08/11/2019 1300 Gross per 24 hour  Intake 720 ml  Output 4117 ml  Net -3397 ml     Physical Exam: Vital Signs Blood pressure 126/62, pulse 83, temperature 97.7 F (36.5 C), temperature source Oral, resp. rate 18, height 5\' 11"  (1.803 m), weight 108.8 kg, SpO2 98 %. Constitutional: No distress . Vital signs reviewed. Pt asleep, lying supine in bed, more comfortable than yesterday, per staff HENT: Normocephalic.  Atraumatic. Eyes: EOMI. No discharge. Cardiovascular: No JVD. Respiratory: Normal effort.  No stridor. GI: Non-distended. Skin: Warm and dry.  Intact. Psych: Normal mood.  Normal behavior. Musc: Left stump with edema and tenderness.  Tender to palpation left hamstrings Neurological: He is alert and oriented Motor: B/l UE, RLE: 5/5 proximal to distal, unchanged LLE: HF 3/5 (pain inhibition), unchanged RLE: 5/5 proximal distal Sensation grossly intact to light touch  Skin: He is not diaphoretic.  Left stump with erythema and edema.  Central incision with dark area. Psychiatric: He has a normal mood and affect. His behavior is normal.    Assessment/Plan: 1. Functional deficits secondary to L BKA which require 3+ hours per day of interdisciplinary therapy in a  comprehensive inpatient rehab setting.  Physiatrist is providing close team supervision and 24 hour management of active medical problems listed below.  Physiatrist and rehab team continue to assess barriers to discharge/monitor patient progress toward functional and medical goals  Care Tool:  Bathing    Body parts bathed by patient: Right arm, Left arm, Chest, Abdomen, Front perineal area, Right upper leg, Left upper leg, Face, Right lower leg   Body parts bathed by helper: Buttocks Body parts n/a: Left lower leg   Bathing assist Assist Level: Minimal Assistance - Patient > 75%     Upper Body Dressing/Undressing Upper body dressing   What is the patient wearing?: Hospital gown only    Upper body assist Assist Level: Set up assist    Lower Body Dressing/Undressing Lower body dressing      What is the patient wearing?: Underwear/pull up, Pants     Lower body assist Assist for lower body dressing: Moderate Assistance - Patient 50 - 74%     Toileting Toileting    Toileting assist Assist for toileting: Moderate Assistance - Patient 50 - 74%     Transfers Chair/bed transfer  Transfers assist     Chair/bed transfer assist level: Minimal Assistance - Patient > 75%     Locomotion Ambulation   Ambulation assist   Ambulation activity did not occur: Safety/medical concerns          Walk 10 feet activity   Assist  Walk 10 feet activity did not occur: Safety/medical concerns  Walk 50 feet activity   Assist Walk 50 feet with 2 turns activity did not occur: Safety/medical concerns         Walk 150 feet activity   Assist Walk 150 feet activity did not occur: Safety/medical concerns         Walk 10 feet on uneven surface  activity   Assist Walk 10 feet on uneven surfaces activity did not occur: Safety/medical concerns         Wheelchair     Assist     Wheelchair activity did not occur: (pain)         Wheelchair 50  feet with 2 turns activity    Assist            Wheelchair 150 feet activity     Assist          Blood pressure 126/62, pulse 83, temperature 97.7 F (36.5 C), temperature source Oral, resp. rate 18, height 5\' 11"  (1.803 m), weight 108.8 kg, SpO2 98 %.   CBG (last 3)  Recent Labs    08/10/19 2200 08/11/19 0643 08/11/19 1132  GLUCAP 206* 169* 212*    Medical Problem List and Plan: 1.  Deficits with mobility, transfers, self-care secondary to left BKA.  Continue CIR 2.  Mechanical AVR/Antithrombotics: -DVT/anticoagulation:  Pharmaceutical: Coumadin             -antiplatelet therapy: N/A  On heparin GGT this a.m., likely DC soon 3. Pain Management: Severe hamstring pain  Continue Norco as needed  Valium changed to scheduled Robaxin on 9/7  Educated patient on avoiding knee flexion             Monitor with increased mobility 4. Mood: LCSW to follow for evaluation and support.              -antipsychotic agents: N/A 5. Neuropsych: This patient is capable of making decisions on his own behalf. 6. Skin/Wound Care:   Monitor wound for healing and signs and symptoms of infection.   Kerlix with Ace wrap as ordered to stump  Stump protector ordered 7. Fluids/Electrolytes/Nutrition: Strict I/Os.  8. CAD/Systolic CHF: Added HH restrictions to diet. On Lanoxin, Metoprolol and Demadex.              Monitor for signs/symptoms of fluid overload             Daily weights Filed Weights   08/08/19 0609 08/09/19 0315 08/11/19 0500  Weight: 110.7 kg 111.4 kg 108.8 kg   Relatively stable on 9/7 9. Acute on chronic renal failure:   Creatinine 1.84 on 9/7, labs ordered for tomorrow  Encourage fluids  9/8- Cr down to 1.47 and BUN down to 41 10 Leucocytosis: Monitor wound, trend and for other signs of infection.              WBCs 14.8 on 9/7, labs ordered for tomorrow  Monitor stump closely for infection  Afebrile  UA unremarkable, urine culture no growth  9/8- WBC down  to 13.9, afebrile, however residual limb most likely cause of leukocytosis- will monitor closely-  11. Acute on chronic anemia:             Hemoglobin 9.2 on 9/7 12. COPD: Respiratory status stable on Dulera 13. OSA: Has been compliant with CPAP at nights.  14. T2DM with neuropathy: On Lantus and amaryl with BS reasonable. Monitor BS ac/hs             Monitor with increased mobility  Increase Lantus to 33 units BID on 9/7  Labile on 9/7 15. Anxiety/depression: Managed on Lexapro.  16. Constipation-   Added Senokot 1 tab BID   Increased Colace  17. Foley/urinary retention  increased Flomax to 0.8 mg QHS to help him void.  Bethanechol 5 3 times daily started on 9/7  Continues to require I/O cath  9/8- volumes >1000-1400cc per staff- will need to fluid restrict pt, since drinking so much. 18.  Sleep disturbance- will give trazodone 50 mg QHS prn. 19. Itching- 9/4-ordered cortisone lotion for back TID  Improving 20.  Hyponatremia  Sodium 130 on 9/7, labs ordered for tomorrow  Continue to monitor  9/8- Na up to 135.  LOS: 6 days A FACE TO FACE EVALUATION WAS PERFORMED  Joseph Hoffman 08/11/2019, 4:25 PM

## 2019-08-12 ENCOUNTER — Inpatient Hospital Stay (HOSPITAL_COMMUNITY): Payer: Medicare Other

## 2019-08-12 ENCOUNTER — Inpatient Hospital Stay (HOSPITAL_COMMUNITY): Payer: Medicare Other | Admitting: Psychology

## 2019-08-12 LAB — CBC WITH DIFFERENTIAL/PLATELET
Abs Immature Granulocytes: 0.09 10*3/uL — ABNORMAL HIGH (ref 0.00–0.07)
Basophils Absolute: 0.1 10*3/uL (ref 0.0–0.1)
Basophils Relative: 1 %
Eosinophils Absolute: 0.4 10*3/uL (ref 0.0–0.5)
Eosinophils Relative: 3 %
HCT: 27.6 % — ABNORMAL LOW (ref 39.0–52.0)
Hemoglobin: 8.6 g/dL — ABNORMAL LOW (ref 13.0–17.0)
Immature Granulocytes: 1 %
Lymphocytes Relative: 6 %
Lymphs Abs: 0.9 10*3/uL (ref 0.7–4.0)
MCH: 27 pg (ref 26.0–34.0)
MCHC: 31.2 g/dL (ref 30.0–36.0)
MCV: 86.5 fL (ref 80.0–100.0)
Monocytes Absolute: 1.3 10*3/uL — ABNORMAL HIGH (ref 0.1–1.0)
Monocytes Relative: 9 %
Neutro Abs: 11.7 10*3/uL — ABNORMAL HIGH (ref 1.7–7.7)
Neutrophils Relative %: 80 %
Platelets: 270 10*3/uL (ref 150–400)
RBC: 3.19 MIL/uL — ABNORMAL LOW (ref 4.22–5.81)
RDW: 19.1 % — ABNORMAL HIGH (ref 11.5–15.5)
WBC: 14.6 10*3/uL — ABNORMAL HIGH (ref 4.0–10.5)
nRBC: 0 % (ref 0.0–0.2)

## 2019-08-12 LAB — GLUCOSE, CAPILLARY
Glucose-Capillary: 139 mg/dL — ABNORMAL HIGH (ref 70–99)
Glucose-Capillary: 181 mg/dL — ABNORMAL HIGH (ref 70–99)
Glucose-Capillary: 177 mg/dL — ABNORMAL HIGH (ref 70–99)
Glucose-Capillary: 253 mg/dL — ABNORMAL HIGH (ref 70–99)

## 2019-08-12 LAB — PROTIME-INR
INR: 4.4 (ref 0.8–1.2)
Prothrombin Time: 41.1 seconds — ABNORMAL HIGH (ref 11.4–15.2)

## 2019-08-12 MED ORDER — VITAMIN K1 10 MG/ML IJ SOLN
10.0000 mg | Freq: Once | INTRAVENOUS | Status: DC
  Filled 2019-08-12: qty 1

## 2019-08-12 MED ORDER — CYCLOBENZAPRINE HCL 10 MG PO TABS
10.0000 mg | ORAL_TABLET | Freq: Three times a day (TID) | ORAL | Status: DC
  Administered 2019-08-12 (×3): 10 mg via ORAL
  Filled 2019-08-12 (×3): qty 1

## 2019-08-12 MED ORDER — VITAMIN K1 10 MG/ML IJ SOLN
5.0000 mg | INTRAVENOUS | Status: AC
  Administered 2019-08-12: 5 mg via INTRAVENOUS
  Filled 2019-08-12: qty 0.5

## 2019-08-12 MED ORDER — DOXYCYCLINE HYCLATE 100 MG PO TABS
100.0000 mg | ORAL_TABLET | Freq: Two times a day (BID) | ORAL | Status: DC
  Administered 2019-08-12 (×2): 100 mg via ORAL
  Filled 2019-08-12 (×2): qty 1

## 2019-08-12 MED ORDER — POLYETHYLENE GLYCOL 3350 17 G PO PACK
17.0000 g | PACK | Freq: Two times a day (BID) | ORAL | Status: DC
  Administered 2019-08-12: 17 g via ORAL
  Filled 2019-08-12: qty 1

## 2019-08-12 MED ORDER — SENNOSIDES-DOCUSATE SODIUM 8.6-50 MG PO TABS
2.0000 | ORAL_TABLET | Freq: Two times a day (BID) | ORAL | Status: DC
  Administered 2019-08-12: 2 via ORAL
  Filled 2019-08-12: qty 2

## 2019-08-12 NOTE — Consult Note (Signed)
Neuropsychological Consultation   Patient:   Joseph Hoffman   DOB:   15-Jan-1951  MR Number:  [id number]  Location:  [facility] [address] CENTER [address] [place] [id number] [place] [address] Dept: [place]: [phone]           Date of Service:   08/12/2019  Start Time:   10 AM End Time:   10:45 AM  Provider/Observer:  Ilean Skill, Psy.D.       Clinical Neuropsychologist       Billing Code/Service: [phone]  Chief Complaint:    Joseph Hoffman is a 68 year old male with a history of hypertension, type 2 diabetes, CAF, mechanical AVR, CAD pending CABG, systolic CHF, CKD, OSA with CPAP device and 2 L oxygen, with a left great toe amputation on 07/03/2019 and poor healing.  The patient was admitted on 8/26 with N/V, diarrhea and left foot infection.  He was started on IV antibiotics and underwent left below the knee amputation on 07/31/2019.  Hospital course was complicated by lethargy and confusion there were thought to be secondary to narcotics, hypotension and AKI.  Patient was evaluated and ultimately recommended for the comprehensive rehabilitation unit due to functional decline.  Reason for Service:  The patient was referred for neuropsychological consultation due to coping and adjustment issues following his left below the knee amputation.  The patient also had periods of confusion and lethargy initially after the surgery but these appear to have cleared overall.  Below is the HPI for the current admission.  HPI: Joseph Hoffman is a 68 year old male with history of HTN, T2DM, CAF, mechanical AVR (leaking)- on coumadin,  CAD--pending CABG, systolic CHF, CKD, OSA--CPAP with 2 L oxygen, left great toe amputation 07/03/19  with poor healing and purulent drainage. History taken from chart review and patient. He was admitted on 07/29/2019 with N/V, diarrhea and left foot infection. He was started on IV abx. He underwent L-BKA on   07/31/2019 by Dr. Carlis Abbott. Hospital course complicated by lethargy and confusion, thought to be secondary to narcotics, hypotensions and AKI.  Entersto was discontinued on 08/21. He continues on heparin/Coumadin bridge as INR subtherapeutic and stump sock ordered from [employer]. Therapy evaluations completed and aptient limited by BKA as well neuropathy. CIR recommended due to functional decline. Please also see preadmission assessment from earlier today.   Current Status:  The patient was oriented with good mental status today and in a very upbeat and jovial mood.  The patient denied any significant issues of depression or anxiety and reports that while he initially struggled with coping and adjustment to the resulting loss of his leg patient reports that he is managing better and is looking forward to getting a prosthetic and moving forward with his life.  The patient reports that his wife was more pessimistic about his prognosis with a prosthetic but the more he is working with therapy the more comfortable he is in the more confidence that he is going to be able to successfully transition to a prosthetic.  Behavioral Observation: Joseph Hoffman  presents as a 68 y.o.-year-old Right Caucasian Male who appeared his stated age. his dress was Appropriate and he was Well Groomed and his manners were Appropriate to the situation.  his participation was indicative of Appropriate and Attentive behaviors.  There were any physical disabilities noted.  he displayed an appropriate level of cooperation and motivation.     Interactions:  Active Appropriate and Attentive  Attention:   within normal limits and attention span and concentration were age appropriate  Memory:   within normal limits; recent and remote memory intact  Visuo-spatial:  not examined  Speech (Volume):  normal  Speech:   normal; normal  Thought Process:  Coherent and Relevant  Though Content:  WNL; not suicidal and not  homicidal  Orientation:   person, place, time/date and situation  Judgment:   Good  Planning:   Good  Affect:    Appropriate  Mood:    Euthymic  Insight:   Good  Intelligence:   normal  Medical History:   Past Medical History:  Diagnosis Date  . Anxiety   . Aortic stenosis   . Arthritis   . Asthma   . Asymptomatic LV dysfunction, EF 45%, normal coronary arteries on cardiac cath 09/18/12 09/30/2012  . CHF (congestive heart failure) ([place])   . Chronic anticoagulation, on Xarelto prior to admit 09/30/2012  . DM (diabetes mellitus) ([place]) 09/30/2012  . Hepatitis B 2003  . Hypertension   . Myocardial infarction ([place])   . Permanent atrial fibrillation, since 1994 09/30/2012   stress test 02/08/12- normal study, no significant ischemia  . S/P AVR (aortic valve replacement), 09/30/2012   a. s/p mechcanical AVR in 09/2012 (on Coumadin)  . Sleep apnea    uses CPAP        Psychiatric History:  The patient does have a prior history of anxiety and reports that while initially was quite anxious about his prognosis moving forward and how he would manage after his amputation the patient reports that the symptoms have improved and his confidence and comfort level is been growing.  The patient reports that he has had fears regarding potential infection due to cardiovascular issues which is 1 of the primary reasons why he went ahead and decided as quickly as he did to have the amputation to avoid any type of septic or infectious process that could complicate his cardiovascular status.  Family Med/Psych History:  Family History  Problem Relation Age of Onset  . Hypertension Mother   . Diabetes Father   . Heart attack Brother 63  . Hyperlipidemia Brother 26       stents placed    Impression/DX:  Joseph Hoffman is a 68 year old male with a history of hypertension, type 2 diabetes, CAF, mechanical AVR, CAD pending CABG, systolic CHF, CKD, OSA with CPAP device and 2 L oxygen, with a left great  toe amputation on 07/03/2019 and poor healing.  The patient was admitted on 8/26 with N/V, diarrhea and left foot infection.  He was started on IV antibiotics and underwent left below the knee amputation on 07/31/2019.  Hospital course was complicated by lethargy and confusion there were thought to be secondary to narcotics, hypotension and AKI.  Patient was evaluated and ultimately recommended for the comprehensive rehabilitation unit due to functional decline.  The patient was oriented with good mental status today and in a very upbeat and jovial mood.  The patient denied any significant issues of depression or anxiety and reports that while he initially struggled with coping and adjustment to the resulting loss of his leg patient reports that he is managing better and is looking forward to getting a prosthetic and moving forward with his life.  The patient reports that his wife was more pessimistic about his prognosis with a prosthetic but the more he is working with therapy the more comfortable he is in the more  confidence that he is going to be able to successfully transition to a prosthetic.  Disposition/Plan:  Worked on coping and adjustment issues and the patient does appear to be managing well.  I do not anticipate the need to see him again but if the patient's anxiety does increase over become more problematic I will be available to see him again to continue to work on coping and adjustment issues.  Diagnosis:    Left BKA        Electronically Signed   _______________________ Ilean Skill, Psy.D.

## 2019-08-12 NOTE — Progress Notes (Signed)
Pt stated he could place self on cpap when ready. RT let pt know if he needed help to let RN know and they would call.

## 2019-08-12 NOTE — Progress Notes (Addendum)
ANTICOAGULATION CONSULT NOTE - Follow Up Consult  Pharmacy Consult for Warfarin (to reverse) + Hep IV when INR<2.5 Indication: Mech AVR  Allergies  Allergen Reactions  . Iohexol Anaphylaxis  . Niacin And Related     Flushing with immediate realese  . Penicillins Other (See Comments)    Unknown.[name]  Did it involve swelling of the face/tongue/throat, SOB, or low BP? Unknown Did it involve sudden or severe rash/hives, skin peeling, or any reaction on the inside of your mouth or nose? Unknown Did you need to seek medical attention at a hospital or doctor's office? Unknown When did it last happen?Childhood If all above answers are "NO", may proceed with cephalosporin use.    Patient Measurements: Height: 5\' 11"  (180.3 cm) Weight: 238 lb 5.1 oz (108.1 kg) IBW/kg (Calculated) : 75.3   Vital Signs: Temp: 99.6 F (37.6 C) (09/09 1427) Temp Source: Oral (09/09 1427) BP: 136/47 (09/09 1427) Pulse Rate: 75 (09/09 1427)  Labs: Recent Labs    08/09/19 1924  08/10/19 0341 08/11/19 0421 08/12/19 0445 08/12/19 1309  HGB  --    < > 9.0* 8.6*  --  8.6*  HCT  --   --  28.5* 27.7*  --  27.6*  PLT  --   --  270 270  --  270  LABPROT  --   --  29.7* 36.2* 41.1*  --   INR  --   --  2.9* 3.7* 4.4*  --   HEPARINUNFRC 0.67  --  0.40  --   --   --   CREATININE  --   --  1.84* 1.47*  --   --    < > = values in this interval not displayed.    Estimated Creatinine Clearance: 58.5 mL/min (A) (by C-G formula based on SCr of 1.47 mg/dL (H)).   Assessment: 28 YOM who has been continued warfarin for mechanical AVR however now with plans for potential AKA on 9/10 AM if INR reduced enough. Pharmacy has been consulted to aid with INR reversal and Heparin bridge when INR<2.5  PTA warfarin dose:  10 mg daily except 12.5mg  TTS, admit INR 2.7 (goal 2.5-3.5)  INR earlier today was SUPRAtherapeutic at 4.4, Vit K 10 mg IV x 1 dose has been ordered by the rehab PA Reesa Chew)  however this high dose of Vit K can lead to residual warfarin resistance for >/= 1 week. Given the patient's mechanical valve - will reduce this initial dose to 5 mg IV x 1, will plan to recheck an INR in 6 hours and repeat the dose if it remains >2.5. Once the INR is down to <2.5 - will plan to start a Heparin bridge.     Goal of Therapy:  INR 2.5-3.5 Monitor platelets by anticoagulation protocol: Yes   Plan:  - Reduce Vit K to 5 mg IV x 1 - Will obtain a INR level in q6h - If INR remains >2.5 - will order an additional dose of Vit K - If/when INR<2.5 - will plan to initiate a heparin bridge  Thank you for allowing pharmacy to be a part of this patient's care.  Alycia Rossetti, PharmD, BCPS Clinical Pharmacist Clinical phone for 08/12/2019: [phone] 08/12/2019 5:00 PM   **Pharmacist phone directory can now be found on [email or web address] ([email or web address]).  Listed under [facility].

## 2019-08-12 NOTE — Progress Notes (Signed)
Physical Therapy Session Note  Patient Details  Name: Joseph Hoffman MRN: [id number] Date of Birth: Apr 02, 1951  Today's Date: 08/12/2019 Patient reported 10/10 L residual limb pain upon PT entry. He also stated that he had just spoken with his surgeon who told him he would be going for surgery for a L AKA due to poor wound healing and that he felt overwhelmed and fatigued from the news. PT comforted patient and discussed rehab and prostheses for AKA. Patient in better spirits after, but requesting to rest rather than participate in therapies this afternoon. Patient missed 75 skilled PT due to pain and fatigue. RN made aware.   Deanie Jupiter L Yahia Bottger PT, DPT  08/12/2019, 7:59 AM

## 2019-08-12 NOTE — Patient Care Conference (Signed)
Inpatient RehabilitationTeam Conference and Plan of Care Update Date: 08/12/2019   Time: 9:30 AM    Patient Name: Joseph Hoffman      Medical Record Number: [id number]  Date of Birth: 24-Aug-1951 Sex: Male         Room/Bed: 4M11C/4M11C-01 Payor Info: Payor: MEDICARE / Plan: MEDICARE PART A AND B / Product Type: *No Product type* /    Admitting Diagnosis: 4. Gen Team  LT. Uni. BKA; 16-18days  Admit Date/Time:  08/05/2019  3:52 PM Admission Comments: No comment available   Primary Diagnosis:  Unilateral complete BKA, left, initial encounter [facility]) Principal Problem: Unilateral complete BKA, left, initial encounter [facility])  Patient Active Problem List   Diagnosis Date Noted  . Hyponatremia   . Sleep disturbance   . Type 2 diabetes mellitus with peripheral neuropathy (HCC)   . Labile blood glucose   . AKI (acute kidney injury) ([place])   . Chronic combined systolic and diastolic CHF (congestive heart failure) ([place])   . Myalgia   . Urinary retention   . Insomnia due to medical condition   . Drug-induced constipation   . Anticoagulated on Coumadin   . Unilateral complete BKA, left, initial encounter ([place]) 08/05/2019  . Leukocytosis   . Acute on chronic anemia   . Post-operative pain   . Nausea & vomiting 07/29/2019  . Diarrhea 07/29/2019  . Diabetic ulcer of left foot associated with diabetes mellitus due to underlying condition ([place]) 07/29/2019  . Left ventricular ejection fraction of 30% to 35% 07/29/2019  . PAD (peripheral artery disease) ([place]) 07/28/2019  . Long term (current) use of anticoagulants 07/13/2019  . CAD, multiple vessel 07/10/2019  . Aortic valve regurgitation   . Aortic valve disease   . Foot ulcer, left ([place]) 06/26/2019  . Depression 05/28/2019  . Congestive heart failure (CHF) ([place]) 05/13/2019  . H/O heart valve replacement with mechanical valve   . Type II diabetes mellitus with renal manifestations ([place]) 05/12/2019  . Chest pain 05/12/2019  . Elevated troponin  05/12/2019  . Stage 3 chronic kidney disease ([place]) 05/12/2019  . Acute on chronic systolic (congestive) heart failure ([place]) 05/12/2019  . CRI (chronic renal insufficiency), stage 3 (moderate) ([place]) 04/09/2019  . Acute combined systolic and diastolic heart failure ([place]) 04/09/2019  . Essential hypertension 06/03/2017  . Dyslipidemia, goal LDL below 70 04/11/2017  . AS (aortic stenosis) 10/28/2012  . Acute on chronic systolic heart failure, re-admitted 10/12/12 10/12/2012  . S/P AVR, 09/29/12, [facility] (discharged 10/05/12) 09/30/2012  . Permanent atrial fibrillation, since 1994 09/30/2012  . Type 2 IDDM 09/30/2012  . OSA on CPAP 09/30/2012  . Normal coronary arteries at cath Oct 2013 09/30/2012  . Chronic anticoagulation 09/30/2012  . Severe aortic stenosis 09/25/2012    Expected Discharge Date: Expected Discharge Date: 08/25/19  Team Members Present: Physician leading conference: Dr. Alysia Penna Social Worker Present: Ovidio Kin, LCSW Nurse Present: Judee Clara, LPN PT Present: Apolinar Junes, PT OT Present: Roanna Epley, [place], OT SLP Present: Other (comment)(Erin Smith-SP) PPS Coordinator present : Gunnar Fusi, SLP     Current Status/Progress Goal Weekly Team Focus  Medical   Pt needs to go for L AKA- having loss of circulation to L BKA; WBC 14.6k  also constipated and not voiding; in/out caths and will get him pooping; INR also 4.4- wil give Vit K  L AKA   Bowel/Bladder   Pt is continent of B/B. Pt is able to void but does not empty the  bladder. Bladder scan/In&Out q4-6h  Pt will remain continent of B/B and have ability to empty entire bladder when voiding.  Q2h toileting.Monitor liquid intake due to excessive voiding volumes.   Swallow/Nutrition/ Hydration             ADL's   LB bathing/dressing-mod A at bed level; sliding board tranfsers-min A/mod A; bed mobility-min A; ongoing pain slowing functional gains  mod I overall; shower  transfers-supervision  functional tansfers, sit<>stand, standing balance, BADL training, educaiton   Mobility   Max assist sit<>stands, min to mod assist slideboard transfer, min assist bed mobility  Supervision-mod/I from w/c level, 10' gait goal to get into bathroom  Pain managment, amputee education, w/c mobility, transfers, residual limb management, sit<>stands, and basic transfer training   Communication             Safety/Cognition/ Behavioral Observations            Pain   pt c/o "25 out of 10" L hamstring pain when assessing the pain. Pt has no relief with the pain medications ordered and no relief with position changes and K pad. . Pt requesting more medication for the pain.  Pain will be >3  Assess pain Qshift/ PRN   Skin   Pt skin shows no signs of breakdown, and free of infection. Pt has slight irritation to the lower abd b/w the folds.  Prevent further breakdown nad infection.  Assess skin qshift/ PRN      *See Care Plan and progress notes for long and short-term goals.     Barriers to Discharge  Current Status/Progress Possible Resolutions Date Resolved   Physician    Decreased caregiver support;Medical stability;Neurogenic Bowel & Bladder;Wound Care;Weight bearing restrictions;Medication compliance;Other (comments)  needs another L AKA surgery  mod to max assist esp due to pain  surgery      Nursing                  PT  Lack of/limited family support;Home environment access/layout;Other (comments)  Very limited progress with mobility due to pain/muscle spasms, has small step to enter his home and wife unable to provide physical assist, son available PRN              OT                  SLP                SW                Discharge Planning/Teaching Needs:  HOme with wife who has health issues of her own and will need him to be mod/i-supervision level. Pt is making slow progress pain does limit him.      Team Discussion:  Goals mod/i wheelchair level. Currently  min-mod level. Pain limits him in therapies and participation. Needed to be cath today. Not fully emptying bladder. MD adjusting pain meds. Leg spasms. MD looking at amputaiton and wound today.  Revisions to Treatment Plan:  DC 9/22?    Continued Need for Acute Rehabilitation Level of Care: The patient requires daily medical management by a physician with specialized training in physical medicine and rehabilitation for the following conditions: Daily direction of a multidisciplinary physical rehabilitation program to ensure safe treatment while eliciting the highest outcome that is of practical value to the patient.: Yes Daily medical management of patient stability for increased activity during participation in an intensive rehabilitation regime.: Yes Daily analysis of laboratory values  and/or radiology reports with any subsequent need for medication adjustment of medical intervention for : Post surgical problems;Wound care problems;Blood pressure problems;Renal problems;Other;Diabetes problems   I attest that I was present, lead the team conference, and concur with the assessment and plan of the team. Teleconference due to COVID 19   Louanne Calvillo, [place] Rhyme 08/13/2019, 8:32 AM

## 2019-08-12 NOTE — Progress Notes (Signed)
Occupational Therapy Session Note  Patient Details  Name: Joseph Hoffman MRN: [id number] Date of Birth: 09-11-1951  Today's Date: 08/12/2019 OT Individual Time: [id number] OT Individual Time Calculation (min): 40 min    Short Term Goals: Week 2:  OT Short Term Goal 1 (Week 2): Pt will be able to transfer to [facility] and or toilet with min A using LRAD. OT Short Term Goal 2 (Week 2): Pt will be able to bathe LB with S OT Short Term Goal 3 (Week 2): Pt will be able to dress LB with min A. OT Short Term Goal 4 (Week 2): Pt will be able to maintain static stand for 1 minute with min A to pull clothing over hips.  Skilled Therapeutic Interventions/Progress Updates:    OT intervention with focus on BUE therex and LLE stretching/deep tissue massage and MFR to provide pain relief in LLE.  Pt continues to c/o hamstring spasms just proximal to popliteal. Pt c/o increased tenderness proximal to incision. Pt remained in bed with all needs within reach and bed alarm activated.   Therapy Documentation Precautions:  Precautions Precautions: Fall Precaution Comments: monitor HR and breathing  Restrictions Weight Bearing Restrictions: Yes LLE Weight Bearing: Non weight bearing Other Position/Activity Restrictions: L BKA Pain:  Pt c/o 8/10 pain in LLE; repositioned and soft tissue massage provided  Therapy/Group: Individual Therapy  [name] 08/12/2019, 8:56 AM

## 2019-08-12 NOTE — Progress Notes (Signed)
ANTICOAGULATION CONSULT NOTE - Follow Up Consult  Pharmacy Consult for Coumadin Indication: Mech AVR  Allergies  Allergen Reactions  . Iohexol Anaphylaxis  . Niacin And Related     Flushing with immediate realese  . Penicillins Other (See Comments)    Unknown.Joseph Kitchenaortic stenosis a child  Did it involve swelling of the face/tongue/throat, SOB, or low BP? Unknown Did it involve sudden or severe rash/hives, skin peeling, or any reaction on the inside of your mouth or nose? Unknown Did you need to seek medical attention at a hospital or doctor's office? Unknown When did it last happen?Childhood If all above answers are "NO", may proceed with cephalosporin use.    Patient Measurements: Height: 5\' 11"  (180.3 cm) Weight: 238 lb 5.1 oz (108.1 kg) IBW/kg (Calculated) : 75.3   Vital Signs: Temp: 98.9 F (37.2 C) (09/09 0419) Temp Source: Axillary (09/09 0419) BP: 113/57 (09/09 0419) Pulse Rate: 68 (09/09 0719)  Labs: Recent Labs    08/09/19 1924 08/10/19 0341 08/11/19 0421 08/12/19 0445  HGB  --  9.0* 8.6*  --   HCT  --  28.5* 27.7*  --   PLT  --  270 270  --   LABPROT  --  29.7* 36.2* 41.1*  INR  --  2.9* 3.7* 4.4*  HEPARINUNFRC 0.67 0.40  --   --   CREATININE  --  1.84* 1.47*  --     Estimated Creatinine Clearance: 58.5 mL/min (A) (by C-G formula based on SCr of 1.47 mg/dL (H)).   Assessment: 77 yom continuing on warfarin for mechanical AVR. INR today trended up further to 4.4, now supratherapeutic. CBC stable. No active bleed issues documented.  PTA warfarin dose:  10 mg daily except 12.5mg  TTS, admit INR 2.7 (goal 2.5-3.5)  Goal of Therapy:  INR 2.5-3.5 Monitor platelets by anticoagulation protocol: Yes   Plan:  Hold warfarin tonight with supratherapeutic INR Monitor daily INR, CBC, s/sx bleeding   [name], PharmD, BCPS Please check AMION for all [facility] contact numbers Clinical Pharmacist 08/12/2019 7:55 AM

## 2019-08-12 NOTE — Progress Notes (Signed)
Occupational Therapy Session Note  Patient Details  Name: Joseph Hoffman MRN: [id number] Date of Birth: 11/01/1951  Today's Date: 08/12/2019 OT Individual Time: 1115-1155 OT Individual Time Calculation (min): 40 min    Short Term Goals: Week 2:  OT Short Term Goal 1 (Week 2): Pt will be able to transfer to [facility] and or toilet with min A using LRAD. OT Short Term Goal 2 (Week 2): Pt will be able to bathe LB with S OT Short Term Goal 3 (Week 2): Pt will be able to dress LB with min A. OT Short Term Goal 4 (Week 2): Pt will be able to maintain static stand for 1 minute with min A to pull clothing over hips.  Skilled Therapeutic Interventions/Progress Updates:    Pt resting in bed upon arrival with "excrutiating" pain in LLE proximal to incision.  ACE wrap and curlex removed with increased drainage noted. RN notified and PA notified. PA [name] Love examined incision and stated she was Occupational psychologist. Pt with sporadic spasms and pt yelling out in pain.  Assisted pt with repositioning in bed and curlex and ACE wrapped residual limb to mid thigh.  Pt's LLE repositioned and RN present. Bed alarm activated.   Therapy Documentation Precautions:  Precautions Precautions: Fall Precaution Comments: monitor HR and breathing  Restrictions Weight Bearing Restrictions: Yes LLE Weight Bearing: Non weight bearing Other Position/Activity Restrictions: L BKA  Pain:  Pt c/o "15/10" pain in LLE with spasms and phantom pain   Therapy/Group: Individual Therapy  [name] 08/12/2019, 12:16 PM

## 2019-08-12 NOTE — Progress Notes (Addendum)
[facility] [place]  Subjective  - He feels clammy and hot.   Objective (!) 107/52 (!) 58 98.7 F (37.1 C) (Oral) 18 95%  Intake/Output Summary (Last 24 hours) at 08/12/2019 1241 Last data filed at 08/12/2019 0800 Gross per 24 hour  Intake 480 ml  Output 4417 ml  Net -3937 ml        Very painful to movement and touch.   Assessment/Planning:  Ischemic changes to left BKA with edema and erythema  Angiogram pre BKA showed tibial disease   On the left side which is the side of interest, the patient has a patent common femoral, profunda, SFA, and above and below-knee popliteal artery with no flow-limiting stenosis.  He has severe tibial disease below the knee.  He has inline flow down to the foot through a very robust peroneal artery that is widely patent throughout its course.  Posterior tibial has a high takeoff and then occludes shortly after takeoff.  Its occluded through most of his segment and then there is a small diseased PT that reconstitutes at the foot off the peroneal distally.  The anterior tibial also is patent proximally but then occluded shortly after takeoff.  It also has a long segment chronic total occlusion.  There is a dorsalis pedis that reconstitutes at the foot off a collateral from the peroneal with retrograde filling of the AT.  He will likely need conversion to AKA.  Afebrile, WBC elevated 13.9  Joseph Hoffman 08/12/2019 12:41 PM --  Laboratory Lab Results: Recent Labs    08/10/19 0341 08/11/19 0421  WBC 14.8* 13.9*  HGB 9.0* 8.6*  HCT 28.5* 27.7*  PLT 270 270   BMET Recent Labs    08/10/19 0341 08/11/19 0421  NA 130* 135  K 4.3 4.2  CL 96* 98  CO2 24 27  GLUCOSE 243* 186*  BUN 50* 41*  CREATININE 1.84* 1.47*  CALCIUM 9.0 9.2    COAG Lab Results  Component Value Date   INR 4.4 (HH) 08/12/2019   INR 3.7 (H) 08/11/2019   INR 2.9 (H) 08/10/2019   No results found for: PTT  I have seen and  evaluated the patient. I agree with the PA note as documented above.  68 year old male status post left below-knee amputation on 07/31/2019.  We were called by rehab today due to noted ecchymosis and nonhealing of left BKA.  Patient has been having increasing stump pain over the last 2 days.  This is in the setting of severe tibial disease.  Ultimately he wants to move forward with his heart surgery as his primary goal.  Discussed options of BKA revision and possible leaving this open with a VAC versus converting him to an AKA.  He feels adamant about proceeding with AKA to facilitate his hospital discharge.  We will schedule for tomorrow.  I have asked rehab to give him IV vitamin K to reverse his INR recheck an INR tomorrow morning and have n.p.o. from midnight.  Marty Heck, MD [facility] [place] [facility] Office: [phone] Pager: [phone]

## 2019-08-12 NOTE — Progress Notes (Signed)
Social Work Patient ID: Joseph Hoffman, male   DOB: Oct 08, 1951, 68 y.o.   MRN: [id number] Met with pt to discuss team conference and his goals of mod/i wheelchair level and target discharge 9/22. He reports he may need more surgery and is waiting to hear. Will await MD decision and work with pt on his discharge needs.

## 2019-08-12 NOTE — Progress Notes (Signed)
Occupational Therapy Session Note  Patient Details  Name: Joseph Hoffman MRN: [id number] Date of Birth: [birth year]  Today's Date: 08/12/2019 OT Individual Time: 1300-1326 OT Individual Time Calculation (min): 26 min    Short Term Goals: Week 1:  OT Short Term Goal 1 (Week 1): Pt will be able to transfer to [facility] and or toilet with min A using LRAD. OT Short Term Goal 1 - Progress (Week 1): Progressing toward goal OT Short Term Goal 2 (Week 1): Pt will be able to bathe LB with S OT Short Term Goal 2 - Progress (Week 1): Progressing toward goal OT Short Term Goal 3 (Week 1): Pt will be able to dress LB with min A. OT Short Term Goal 3 - Progress (Week 1): Progressing toward goal OT Short Term Goal 4 (Week 1): Pt will be able to maintain static stand for 1 minute with min A to pull clothing over hips. OT Short Term Goal 4 - Progress (Week 1): Progressing toward goal  Skilled Therapeutic Interventions/Progress Updates:    1:1. Pt received in bed with pain in LLE and muscle spasms in hamstring. Pt repositioned for comfort. Dressing off residual limb d/t surgeon needing to come and inspect wound. Pt asking about differences between BKA and AKA, prognosis, prosthetic differences and quality of life. OT educates and provides pt with informational viseo describing prosthesis for AKA as well as demoing ambulation. Pt very encouraged by video and continues to ask appropriate questions. Exited session with pt seated in bed, exit alarm on and call light inreach  Therapy Documentation Precautions:  Precautions Precautions: Fall Precaution Comments: monitor HR and breathing  Restrictions Weight Bearing Restrictions: Yes LLE Weight Bearing: Non weight bearing Other Position/Activity Restrictions: L BKA General:   Vital Signs: Therapy Vitals Temp: 98.7 F (37.1 C) Temp Source: Oral Pulse Rate: (!) 58 Resp: 18 BP: (!) 107/52 Patient Position (if appropriate): Lying Oxygen Therapy SpO2: 95 % O2  Device: Room Air Pain:   ADL: ADL Eating: Independent Grooming: Independent Where Assessed-Grooming: Sitting at sink Upper Body Bathing: Setup Where Assessed-Upper Body Bathing: Edge of bed Lower Body Bathing: Minimal assistance Where Assessed-Lower Body Bathing: Edge of bed Upper Body Dressing: Setup Where Assessed-Upper Body Dressing: Wheelchair Lower Body Dressing: Moderate assistance Where Assessed-Lower Body Dressing: Edge of bed Vision   Perception    Praxis   Exercises:   Other Treatments:     Therapy/Group: Individual Therapy  Tonny Branch 08/12/2019, 1:28 PM

## 2019-08-12 NOTE — Progress Notes (Signed)
[place] PHYSICAL MEDICINE & REHABILITATION PROGRESS NOTE   Subjective/Complaints: Patient reports no BM 'since last week" and still not voiding- concerned hasn't pooped- says R hamstring pain is somewhat better- the spasms are the worst when they occur.   ROS: +Myalgia.  Denies CP, shortness of breath, nausea, vomiting, diarrhea.  Objective:   No results found. Recent Labs    08/11/19 0421 08/12/19 1309  WBC 13.9* 14.6*  HGB 8.6* 8.6*  HCT 27.7* 27.6*  PLT 270 270   Recent Labs    08/10/19 0341 08/11/19 0421  NA 130* 135  K 4.3 4.2  CL 96* 98  CO2 24 27  GLUCOSE 243* 186*  BUN 50* 41*  CREATININE 1.84* 1.47*  CALCIUM 9.0 9.2    Intake/Output Summary (Last 24 hours) at 08/12/2019 1556 Last data filed at 08/12/2019 1450 Gross per 24 hour  Intake 240 ml  Output 4500 ml  Net -4260 ml     Physical Exam: Vital Signs Blood pressure (!) 136/47, pulse 75, temperature 99.6 F (37.6 C), temperature source Oral, resp. rate 18, height 5\' 11"  (1.803 m), weight 108.1 kg, SpO2 95 %. Constitutional: No distress . Vital signs reviewed. Pt awake, has CPAP on; took off; lying in bed, wearing ACE wrap on L BKA- grabbed L hamstring occ and mentioned he had issues in past with this. HENT: Normocephalic.  Atraumatic. Eyes: EOMI. No discharge. Cardiovascular: No JVD. Respiratory: Normal effort.  No stridor. GI: Non-distended. Skin: Warm and dry.  Intact. Psych: Normal mood.  Normal behavior. Musc: Left stump with edema and tenderness.  Tender to palpation left hamstrings- no wound on L hamstring Neurological: He is alert and oriented Motor: B/l UE, RLE: 5/5 proximal to distal, unchanged LLE: HF 3/5 (pain inhibition), unchanged RLE: 5/5 proximal distal Sensation grossly intact to light touch  Skin: He is not diaphoretic.  Left stump with erythema and edema.  Central incision with dark area. Per pic from PA, looks darker around incision line than over weekend Psychiatric: He has  a normal mood and affect. His behavior is normal.      Assessment/Plan: 1. Functional deficits secondary to L BKA which require 3+ hours per day of interdisciplinary therapy in a comprehensive inpatient rehab setting.  Physiatrist is providing close team supervision and 24 hour management of active medical problems listed below.  Physiatrist and rehab team continue to assess barriers to discharge/monitor patient progress toward functional and medical goals  Care Tool:  Bathing    Body parts bathed by patient: Right arm, Left arm, Chest, Abdomen, Front perineal area, Right upper leg, Left upper leg, Face, Right lower leg   Body parts bathed by helper: Buttocks Body parts n/a: Left lower leg   Bathing assist Assist Level: Minimal Assistance - Patient > 75%     Upper Body Dressing/Undressing Upper body dressing   What is the patient wearing?: Hospital gown only    Upper body assist Assist Level: Set up assist    Lower Body Dressing/Undressing Lower body dressing      What is the patient wearing?: Underwear/pull up, Pants     Lower body assist Assist for lower body dressing: Moderate Assistance - Patient 50 - 74%     Toileting Toileting    Toileting assist Assist for toileting: Moderate Assistance - Patient 50 - 74%     Transfers Chair/bed transfer  Transfers assist     Chair/bed transfer assist level: Moderate Assistance - Patient 50 - 74% Chair/bed transfer assistive device:  Sliding board   Locomotion Ambulation   Ambulation assist   Ambulation activity did not occur: Safety/medical concerns          Walk 10 feet activity   Assist  Walk 10 feet activity did not occur: Safety/medical concerns        Walk 50 feet activity   Assist Walk 50 feet with 2 turns activity did not occur: Safety/medical concerns         Walk 150 feet activity   Assist Walk 150 feet activity did not occur: Safety/medical concerns         Walk 10 feet on  uneven surface  activity   Assist Walk 10 feet on uneven surfaces activity did not occur: Safety/medical concerns         Wheelchair     Assist     Wheelchair activity did not occur: (pain)         Wheelchair 50 feet with 2 turns activity    Assist            Wheelchair 150 feet activity     Assist          Blood pressure (!) 136/47, pulse 75, temperature 99.6 F (37.6 C), temperature source Oral, resp. rate 18, height 5\' 11"  (1.803 m), weight 108.1 kg, SpO2 95 %.     CBG (last 3)  Recent Labs    08/11/19 2118 08/12/19 0627 08/12/19 1226  GLUCAP 238* 139* 181*    Medical Problem List and Plan: 1.  Deficits with mobility, transfers, self-care secondary to left BKA.  Continue CIR 2.  Mechanical AVR/Antithrombotics: -DVT/anticoagulation:  Pharmaceutical: Coumadin             -antiplatelet therapy: N/A  On heparin GGT this a.m., likely DC soon  9/9- pt seen by vascular- offered I&D vs L AKA_ he decided to get AKA- will go in AM if INR has reduced, since supratherapeutic- INR 4.4 Held lantus and amaryl for AM and Vit K given- 10 mg given already.  3. Pain Management: Severe hamstring pain  Continue Norco as needed  Valium changed to scheduled Robaxin on 9/7  Educated patient on avoiding knee flexion             Monitor with increased mobility  9/9- going back to OR for AKA 4. Mood: LCSW to follow for evaluation and support.              -antipsychotic agents: N/A 5. Neuropsych: This patient is capable of making decisions on his own behalf. 6. Skin/Wound Care:   Monitor wound for healing and signs and symptoms of infection.   Kerlix with Ace wrap as ordered to stump  Stump protector ordered 7. Fluids/Electrolytes/Nutrition: Strict I/Os.  8. CAD/Systolic CHF: Added HH restrictions to diet. On Lanoxin, Metoprolol and Demadex.              Monitor for signs/symptoms of fluid overload             Daily weights Filed Weights   08/09/19  0315 08/11/19 0500 08/12/19 0500  Weight: 111.4 kg 108.8 kg 108.1 kg   Relatively stable on 9/7 9. Acute on chronic renal failure:   Creatinine 1.84 on 9/7, labs ordered for tomorrow  Encourage fluids  9/8- Cr down to 1.47 and BUN down to 41 10 Leucocytosis: Monitor wound, trend and for other signs of infection.              WBCs 14.8 on 9/7, labs  ordered for tomorrow  Monitor stump closely for infection  Afebrile  UA unremarkable, urine culture no growth  9/8- WBC down to 13.9, afebrile, however residual limb most likely cause of leukocytosis- will monitor closely-   9/9- WBC up to 14.6k- vascular called- will go for AKA tomorrow, if INR down. 11. Acute on chronic anemia:             Hemoglobin 9.2 on 9/7 12. COPD: Respiratory status stable on Dulera 13. OSA: Has been compliant with CPAP at nights.  14. T2DM with neuropathy: On Lantus and amaryl with BS reasonable. Monitor BS ac/hs             Monitor with increased mobility  Increase Lantus to 33 units BID on 9/7  Labile on 9/7 15. Anxiety/depression: Managed on Lexapro.  16. Constipation-   Added Senokot 1 tab BID   Increased Colace  17. Foley/urinary retention  increased Flomax to 0.8 mg QHS to help him void.  Bethanechol 5 3 times daily started on 9/7  Continues to require I/O cath  9/8- volumes >1000-1400cc per staff- will need to fluid restrict pt, since drinking so much. 18.  Sleep disturbance- will give trazodone 50 mg QHS prn. 19. Itching- 9/4-ordered cortisone lotion for back TID  Improving 20.  Hyponatremia  Sodium 130 on 9/7, labs ordered for tomorrow  Continue to monitor  9/8- Na up to 135.  LOS: 7 days A FACE TO FACE EVALUATION WAS PERFORMED  Everlena Mackley 08/12/2019, 3:56 PM

## 2019-08-12 NOTE — Progress Notes (Signed)
Lab reported Critical Lab for INR- 4.4- Provider will be notified

## 2019-08-12 NOTE — Progress Notes (Signed)
Occupational Therapy Weekly Progress Note  Patient Details  Name: MCCLAIN SHALL MRN: [id number] Date of Birth: 1951/11/25  Beginning of progress report period: August 06, 2019 End of progress report period: August 12, 2019  Patient has met 0 of 4 short term goals.  Pt progress with BADLs and functional transfers has been inconsistent and minimal since admission.  Pt continues to c/o increased LLE hamstring pain/spasms (pain at 8/10 to 15/10). Pt requires mod A for LB dressing tasks, min A for bed mobility, and min A/mod A for sliding board transfers. Pt's OOB tolerance has been limited secondary to ongoing pain and pt's lack of sleep at night secondary to ongoing pain. Pt requires max A for sit<>stand and standing balance and is currently unable to pull pants over hips while standing.   Patient continues to demonstrate the following deficits: muscle weakness and muscle joint tightness, decreased cardiorespiratoy endurance and decreased standing balance and decreased balance strategies and therefore will continue to benefit from skilled OT intervention to enhance overall performance with BADL and iADL.  Patient progressing toward long term goals..  Continue plan of care.  OT Short Term Goals Week 1:  OT Short Term Goal 1 (Week 1): Pt will be able to transfer to [facility] and or toilet with min A using LRAD. OT Short Term Goal 1 - Progress (Week 1): Progressing toward goal OT Short Term Goal 2 (Week 1): Pt will be able to bathe LB with S OT Short Term Goal 2 - Progress (Week 1): Progressing toward goal OT Short Term Goal 3 (Week 1): Pt will be able to dress LB with min A. OT Short Term Goal 3 - Progress (Week 1): Progressing toward goal OT Short Term Goal 4 (Week 1): Pt will be able to maintain static stand for 1 minute with min A to pull clothing over hips. OT Short Term Goal 4 - Progress (Week 1): Progressing toward goal Week 2:  OT Short Term Goal 1 (Week 2): Pt will be able to transfer to [facility]  and or toilet with min A using LRAD. OT Short Term Goal 2 (Week 2): Pt will be able to bathe LB with S OT Short Term Goal 3 (Week 2): Pt will be able to dress LB with min A. OT Short Term Goal 4 (Week 2): Pt will be able to maintain static stand for 1 minute with min A to pull clothing over hips.    Leotis Shames [facility] 08/12/2019, 6:41 AM

## 2019-08-12 NOTE — Progress Notes (Signed)
Patient with significant pain L-BKA and inability to extend out due to tightness. Very sensitive to touch and ischemic changes noted laterally with blistering as well as significant edema inferior aspect. Will check CBC as INR supra therapeutic. Add doxycycline for wound prophylaxis due to erythema but concerned that its likely due to ischemia v.s infection. VVS contacted to evaluate wound. Continue to wrap to manage edema.   Lateral aspect of wound.

## 2019-08-13 ENCOUNTER — Inpatient Hospital Stay (HOSPITAL_COMMUNITY): Payer: Medicare Other

## 2019-08-13 ENCOUNTER — Inpatient Hospital Stay (HOSPITAL_COMMUNITY): Payer: Medicare Other | Admitting: Occupational Therapy

## 2019-08-13 ENCOUNTER — Inpatient Hospital Stay (HOSPITAL_COMMUNITY)
Admission: RE | Admit: 2019-08-13 | Discharge: 2019-08-17 | DRG: 475 | Disposition: A | Discharge status: Rehabilitation Facility | Payer: Medicare Other | Attending: Vascular Surgery | Admitting: Vascular Surgery

## 2019-08-13 ENCOUNTER — Encounter (HOSPITAL_COMMUNITY): Payer: Self-pay

## 2019-08-13 ENCOUNTER — Encounter (HOSPITAL_COMMUNITY)
Admission: RE | Disposition: A | Discharge status: Rehabilitation Facility | Payer: Self-pay | Source: Home / Self Care | Attending: Vascular Surgery

## 2019-08-13 ENCOUNTER — Other Ambulatory Visit: Payer: Self-pay | Admitting: *Deleted

## 2019-08-13 DIAGNOSIS — R339 Retention of urine, unspecified: Secondary | ICD-10-CM | POA: Diagnosis present

## 2019-08-13 DIAGNOSIS — Z79899 Other long term (current) drug therapy: Secondary | ICD-10-CM

## 2019-08-13 DIAGNOSIS — I5023 Acute on chronic systolic (congestive) heart failure: Secondary | ICD-10-CM | POA: Diagnosis not present

## 2019-08-13 DIAGNOSIS — E1151 Type 2 diabetes mellitus with diabetic peripheral angiopathy without gangrene: Secondary | ICD-10-CM | POA: Diagnosis present

## 2019-08-13 DIAGNOSIS — E871 Hypo-osmolality and hyponatremia: Secondary | ICD-10-CM | POA: Diagnosis present

## 2019-08-13 DIAGNOSIS — I252 Old myocardial infarction: Secondary | ICD-10-CM | POA: Diagnosis not present

## 2019-08-13 DIAGNOSIS — T8189XD Other complications of procedures, not elsewhere classified, subsequent encounter: Secondary | ICD-10-CM | POA: Diagnosis not present

## 2019-08-13 DIAGNOSIS — Z880 Allergy status to penicillin: Secondary | ICD-10-CM

## 2019-08-13 DIAGNOSIS — Y835 Amputation of limb(s) as the cause of abnormal reaction of the patient, or of later complication, without mention of misadventure at the time of the procedure: Secondary | ICD-10-CM | POA: Diagnosis present

## 2019-08-13 DIAGNOSIS — I2510 Atherosclerotic heart disease of native coronary artery without angina pectoris: Secondary | ICD-10-CM | POA: Diagnosis present

## 2019-08-13 DIAGNOSIS — N183 Chronic kidney disease, stage 3 (moderate): Secondary | ICD-10-CM | POA: Diagnosis not present

## 2019-08-13 DIAGNOSIS — R410 Disorientation, unspecified: Secondary | ICD-10-CM | POA: Diagnosis not present

## 2019-08-13 DIAGNOSIS — T8189XA Other complications of procedures, not elsewhere classified, initial encounter: Secondary | ICD-10-CM

## 2019-08-13 DIAGNOSIS — E11649 Type 2 diabetes mellitus with hypoglycemia without coma: Secondary | ICD-10-CM | POA: Diagnosis not present

## 2019-08-13 DIAGNOSIS — R7401 Elevation of levels of liver transaminase levels: Secondary | ICD-10-CM | POA: Diagnosis not present

## 2019-08-13 DIAGNOSIS — Z7901 Long term (current) use of anticoagulants: Secondary | ICD-10-CM

## 2019-08-13 DIAGNOSIS — Z952 Presence of prosthetic heart valve: Secondary | ICD-10-CM | POA: Diagnosis not present

## 2019-08-13 DIAGNOSIS — Z89512 Acquired absence of left leg below knee: Secondary | ICD-10-CM

## 2019-08-13 DIAGNOSIS — Z833 Family history of diabetes mellitus: Secondary | ICD-10-CM | POA: Diagnosis not present

## 2019-08-13 DIAGNOSIS — Z888 Allergy status to other drugs, medicaments and biological substances status: Secondary | ICD-10-CM

## 2019-08-13 DIAGNOSIS — E669 Obesity, unspecified: Secondary | ICD-10-CM | POA: Diagnosis not present

## 2019-08-13 DIAGNOSIS — Z20828 Contact with and (suspected) exposure to other viral communicable diseases: Secondary | ICD-10-CM | POA: Diagnosis present

## 2019-08-13 DIAGNOSIS — Z8249 Family history of ischemic heart disease and other diseases of the circulatory system: Secondary | ICD-10-CM

## 2019-08-13 DIAGNOSIS — G546 Phantom limb syndrome with pain: Secondary | ICD-10-CM | POA: Diagnosis present

## 2019-08-13 DIAGNOSIS — I130 Hypertensive heart and chronic kidney disease with heart failure and stage 1 through stage 4 chronic kidney disease, or unspecified chronic kidney disease: Secondary | ICD-10-CM | POA: Diagnosis present

## 2019-08-13 DIAGNOSIS — K5900 Constipation, unspecified: Secondary | ICD-10-CM | POA: Diagnosis present

## 2019-08-13 DIAGNOSIS — Z87891 Personal history of nicotine dependence: Secondary | ICD-10-CM

## 2019-08-13 DIAGNOSIS — Z7951 Long term (current) use of inhaled steroids: Secondary | ICD-10-CM

## 2019-08-13 DIAGNOSIS — Z9049 Acquired absence of other specified parts of digestive tract: Secondary | ICD-10-CM

## 2019-08-13 DIAGNOSIS — I5082 Biventricular heart failure: Secondary | ICD-10-CM | POA: Diagnosis not present

## 2019-08-13 DIAGNOSIS — Z794 Long term (current) use of insulin: Secondary | ICD-10-CM

## 2019-08-13 DIAGNOSIS — S78112A Complete traumatic amputation at level between left hip and knee, initial encounter: Secondary | ICD-10-CM | POA: Diagnosis not present

## 2019-08-13 DIAGNOSIS — M62838 Other muscle spasm: Secondary | ICD-10-CM | POA: Diagnosis present

## 2019-08-13 DIAGNOSIS — I4821 Permanent atrial fibrillation: Secondary | ICD-10-CM | POA: Diagnosis present

## 2019-08-13 DIAGNOSIS — T8789 Other complications of amputation stump: Principal | ICD-10-CM | POA: Diagnosis present

## 2019-08-13 DIAGNOSIS — D62 Acute posthemorrhagic anemia: Secondary | ICD-10-CM | POA: Diagnosis present

## 2019-08-13 DIAGNOSIS — G92 Toxic encephalopathy: Secondary | ICD-10-CM | POA: Diagnosis not present

## 2019-08-13 DIAGNOSIS — I739 Peripheral vascular disease, unspecified: Secondary | ICD-10-CM | POA: Diagnosis not present

## 2019-08-13 DIAGNOSIS — E1169 Type 2 diabetes mellitus with other specified complication: Secondary | ICD-10-CM | POA: Diagnosis not present

## 2019-08-13 DIAGNOSIS — I351 Nonrheumatic aortic (valve) insufficiency: Secondary | ICD-10-CM | POA: Diagnosis not present

## 2019-08-13 DIAGNOSIS — E1165 Type 2 diabetes mellitus with hyperglycemia: Secondary | ICD-10-CM | POA: Diagnosis not present

## 2019-08-13 DIAGNOSIS — T8754 Necrosis of amputation stump, left lower extremity: Secondary | ICD-10-CM | POA: Diagnosis not present

## 2019-08-13 DIAGNOSIS — I4819 Other persistent atrial fibrillation: Secondary | ICD-10-CM | POA: Diagnosis not present

## 2019-08-13 DIAGNOSIS — G4733 Obstructive sleep apnea (adult) (pediatric): Secondary | ICD-10-CM | POA: Diagnosis present

## 2019-08-13 DIAGNOSIS — R791 Abnormal coagulation profile: Secondary | ICD-10-CM | POA: Diagnosis present

## 2019-08-13 DIAGNOSIS — E1122 Type 2 diabetes mellitus with diabetic chronic kidney disease: Secondary | ICD-10-CM | POA: Diagnosis present

## 2019-08-13 DIAGNOSIS — D72829 Elevated white blood cell count, unspecified: Secondary | ICD-10-CM | POA: Diagnosis not present

## 2019-08-13 DIAGNOSIS — K5903 Drug induced constipation: Secondary | ICD-10-CM | POA: Diagnosis not present

## 2019-08-13 DIAGNOSIS — I5022 Chronic systolic (congestive) heart failure: Secondary | ICD-10-CM | POA: Diagnosis present

## 2019-08-13 DIAGNOSIS — E1152 Type 2 diabetes mellitus with diabetic peripheral angiopathy with gangrene: Secondary | ICD-10-CM | POA: Diagnosis present

## 2019-08-13 DIAGNOSIS — G253 Myoclonus: Secondary | ICD-10-CM | POA: Diagnosis not present

## 2019-08-13 DIAGNOSIS — S88112A Complete traumatic amputation at level between knee and ankle, left lower leg, initial encounter: Secondary | ICD-10-CM | POA: Diagnosis not present

## 2019-08-13 DIAGNOSIS — Z91041 Radiographic dye allergy status: Secondary | ICD-10-CM

## 2019-08-13 DIAGNOSIS — E1142 Type 2 diabetes mellitus with diabetic polyneuropathy: Secondary | ICD-10-CM | POA: Diagnosis not present

## 2019-08-13 DIAGNOSIS — N184 Chronic kidney disease, stage 4 (severe): Secondary | ICD-10-CM | POA: Diagnosis not present

## 2019-08-13 DIAGNOSIS — M791 Myalgia, unspecified site: Secondary | ICD-10-CM | POA: Diagnosis not present

## 2019-08-13 DIAGNOSIS — R7309 Other abnormal glucose: Secondary | ICD-10-CM | POA: Diagnosis not present

## 2019-08-13 DIAGNOSIS — E785 Hyperlipidemia, unspecified: Secondary | ICD-10-CM | POA: Diagnosis not present

## 2019-08-13 DIAGNOSIS — I5042 Chronic combined systolic (congestive) and diastolic (congestive) heart failure: Secondary | ICD-10-CM | POA: Diagnosis not present

## 2019-08-13 DIAGNOSIS — F418 Other specified anxiety disorders: Secondary | ICD-10-CM | POA: Diagnosis not present

## 2019-08-13 DIAGNOSIS — N179 Acute kidney failure, unspecified: Secondary | ICD-10-CM | POA: Diagnosis not present

## 2019-08-13 DIAGNOSIS — Z8349 Family history of other endocrine, nutritional and metabolic diseases: Secondary | ICD-10-CM

## 2019-08-13 DIAGNOSIS — Z7289 Other problems related to lifestyle: Secondary | ICD-10-CM

## 2019-08-13 DIAGNOSIS — T8781 Dehiscence of amputation stump: Secondary | ICD-10-CM | POA: Diagnosis not present

## 2019-08-13 DIAGNOSIS — Z4781 Encounter for orthopedic aftercare following surgical amputation: Secondary | ICD-10-CM | POA: Diagnosis not present

## 2019-08-13 DIAGNOSIS — G8918 Other acute postprocedural pain: Secondary | ICD-10-CM | POA: Diagnosis not present

## 2019-08-13 DIAGNOSIS — G4701 Insomnia due to medical condition: Secondary | ICD-10-CM | POA: Diagnosis not present

## 2019-08-13 HISTORY — PX: AMPUTATION: SHX166

## 2019-08-13 LAB — CBC
HCT: 29.8 % — ABNORMAL LOW (ref 39.0–52.0)
Hemoglobin: 9.4 g/dL — ABNORMAL LOW (ref 13.0–17.0)
MCH: 26.6 pg (ref 26.0–34.0)
MCHC: 31.5 g/dL (ref 30.0–36.0)
MCV: 84.2 fL (ref 80.0–100.0)
Platelets: 270 10*3/uL (ref 150–400)
RBC: 3.54 MIL/uL — ABNORMAL LOW (ref 4.22–5.81)
RDW: 19.5 % — ABNORMAL HIGH (ref 11.5–15.5)
WBC: 13.3 10*3/uL — ABNORMAL HIGH (ref 4.0–10.5)
nRBC: 0 % (ref 0.0–0.2)

## 2019-08-13 LAB — GLUCOSE, CAPILLARY
Glucose-Capillary: 151 mg/dL — ABNORMAL HIGH (ref 70–99)
Glucose-Capillary: 143 mg/dL — ABNORMAL HIGH (ref 70–99)
Glucose-Capillary: 115 mg/dL — ABNORMAL HIGH (ref 70–99)
Glucose-Capillary: 190 mg/dL — ABNORMAL HIGH (ref 70–99)

## 2019-08-13 LAB — PROTIME-INR
INR: 1.4 — ABNORMAL HIGH (ref 0.8–1.2)
INR: 1.8 — ABNORMAL HIGH (ref 0.8–1.2)
Prothrombin Time: 16.9 seconds — ABNORMAL HIGH (ref 11.4–15.2)
Prothrombin Time: 20.5 seconds — ABNORMAL HIGH (ref 11.4–15.2)

## 2019-08-13 LAB — HEPARIN LEVEL (UNFRACTIONATED): Heparin Unfractionated: 0.12 IU/mL — ABNORMAL LOW (ref 0.30–0.70)

## 2019-08-13 SURGERY — AMPUTATION, ABOVE KNEE
Anesthesia: Spinal | Site: Knee | Laterality: Left

## 2019-08-13 MED ORDER — GUAIFENESIN-DM 100-10 MG/5ML PO SYRP: 15.0000 mL | ORAL_SOLUTION | ORAL | Status: DC | PRN

## 2019-08-13 MED ORDER — PANTOPRAZOLE SODIUM 40 MG PO TBEC
40.0000 mg | DELAYED_RELEASE_TABLET | Freq: Every day | ORAL | Status: DC
  Administered 2019-08-13 – 2019-08-17 (×5): 40 mg via ORAL
  Filled 2019-08-13 (×5): qty 1

## 2019-08-13 MED ORDER — ROPIVACAINE HCL 5 MG/ML IJ SOLN
INTRAMUSCULAR | Status: DC | PRN
  Administered 2019-08-13: 20 mL via PERINEURAL

## 2019-08-13 MED ORDER — VITAMIN C 500 MG PO TABS
500.0000 mg | ORAL_TABLET | Freq: Two times a day (BID) | ORAL | Status: DC
  Administered 2019-08-13 – 2019-08-17 (×8): 500 mg via ORAL
  Filled 2019-08-13 (×8): qty 1

## 2019-08-13 MED ORDER — OXYCODONE-ACETAMINOPHEN 10-325 MG PO TABS: 1.0000 | ORAL_TABLET | ORAL | Status: DC | PRN

## 2019-08-13 MED ORDER — BUPIVACAINE IN DEXTROSE 0.75-8.25 % IT SOLN
INTRATHECAL | Status: DC | PRN
  Administered 2019-08-13: 1.8 mL via INTRATHECAL

## 2019-08-13 MED ORDER — SODIUM CHLORIDE 0.9 % IV SOLN
INTRAVENOUS | Status: DC
  Administered 2019-08-13: 21:00:00 via INTRAVENOUS

## 2019-08-13 MED ORDER — VANCOMYCIN HCL IN DEXTROSE 1-5 GM/200ML-% IV SOLN
1000.0000 mg | Freq: Two times a day (BID) | INTRAVENOUS | Status: AC
  Administered 2019-08-14 (×2): 1000 mg via INTRAVENOUS
  Filled 2019-08-13 (×2): qty 200

## 2019-08-13 MED ORDER — ALUM & MAG HYDROXIDE-SIMETH 200-200-20 MG/5ML PO SUSP: 15.0000 mL | ORAL | Status: DC | PRN

## 2019-08-13 MED ORDER — PHENYLEPHRINE HCL (PRESSORS) 10 MG/ML IV SOLN
INTRAVENOUS | Status: DC | PRN
  Administered 2019-08-13 (×2): 60 ug via INTRAVENOUS

## 2019-08-13 MED ORDER — MOMETASONE FURO-FORMOTEROL FUM 200-5 MCG/ACT IN AERO
2.0000 | INHALATION_SPRAY | Freq: Two times a day (BID) | RESPIRATORY_TRACT | Status: DC
  Administered 2019-08-14 – 2019-08-17 (×6): 2 via RESPIRATORY_TRACT
  Filled 2019-08-13: qty 8.8

## 2019-08-13 MED ORDER — SACCHAROMYCES BOULARDII 250 MG PO CAPS
250.0000 mg | ORAL_CAPSULE | ORAL | Status: DC
  Administered 2019-08-14 – 2019-08-17 (×2): 250 mg via ORAL
  Filled 2019-08-13 (×3): qty 1

## 2019-08-13 MED ORDER — SACUBITRIL-VALSARTAN 24-26 MG PO TABS
1.0000 | ORAL_TABLET | Freq: Two times a day (BID) | ORAL | Status: DC
  Administered 2019-08-13 – 2019-08-17 (×8): 1 via ORAL
  Filled 2019-08-13 (×9): qty 1

## 2019-08-13 MED ORDER — LIDOCAINE 2% (20 MG/ML) 5 ML SYRINGE
INTRAMUSCULAR | Status: AC
  Filled 2019-08-13: qty 5

## 2019-08-13 MED ORDER — HEPARIN (PORCINE) 25000 UT/250ML-% IV SOLN
1800.0000 [IU]/h | INTRAVENOUS | Status: DC
  Administered 2019-08-13: 1800 [IU]/h via INTRAVENOUS
  Administered 2019-08-13: 1400 [IU]/h via INTRAVENOUS
  Filled 2019-08-13 (×2): qty 250

## 2019-08-13 MED ORDER — PROPOFOL 10 MG/ML IV BOLUS
INTRAVENOUS | Status: AC
  Filled 2019-08-13: qty 20

## 2019-08-13 MED ORDER — ACETAMINOPHEN 325 MG PO TABS: 325.0000 mg | ORAL_TABLET | ORAL | Status: DC | PRN

## 2019-08-13 MED ORDER — OXYCODONE HCL 5 MG PO TABS
5.0000 mg | ORAL_TABLET | ORAL | Status: DC | PRN
  Administered 2019-08-13 – 2019-08-15 (×5): 5 mg via ORAL
  Administered 2019-08-15: 10 mg via ORAL
  Administered 2019-08-15 – 2019-08-17 (×7): 5 mg via ORAL
  Filled 2019-08-13: qty 2
  Filled 2019-08-13 (×10): qty 1
  Filled 2019-08-13: qty 2
  Filled 2019-08-13: qty 1

## 2019-08-13 MED ORDER — MIDAZOLAM HCL 2 MG/2ML IJ SOLN
1.0000 mg | Freq: Once | INTRAMUSCULAR | Status: AC
  Administered 2019-08-13: 15:00:00 1 mg via INTRAVENOUS

## 2019-08-13 MED ORDER — HEPARIN (PORCINE) 25000 UT/250ML-% IV SOLN
1800.0000 [IU]/h | INTRAVENOUS | Status: DC
  Administered 2019-08-13: 1700 [IU]/h via INTRAVENOUS
  Administered 2019-08-14: 1900 [IU]/h via INTRAVENOUS
  Administered 2019-08-15: 02:00:00 2100 [IU]/h via INTRAVENOUS
  Filled 2019-08-13 (×3): qty 250

## 2019-08-13 MED ORDER — FENTANYL CITRATE (PF) 100 MCG/2ML IJ SOLN
50.0000 ug | Freq: Once | INTRAMUSCULAR | Status: AC
  Administered 2019-08-13: 15:00:00 50 ug via INTRAVENOUS

## 2019-08-13 MED ORDER — INSULIN ASPART 100 UNIT/ML ~~LOC~~ SOLN
0.0000 [IU] | Freq: Three times a day (TID) | SUBCUTANEOUS | Status: DC
  Administered 2019-08-14: 07:00:00 15 [IU] via SUBCUTANEOUS
  Administered 2019-08-14: 8 [IU] via SUBCUTANEOUS
  Administered 2019-08-14: 11:00:00 15 [IU] via SUBCUTANEOUS
  Administered 2019-08-15: 07:00:00 5 [IU] via SUBCUTANEOUS
  Administered 2019-08-15: 11 [IU] via SUBCUTANEOUS
  Administered 2019-08-15 – 2019-08-16 (×2): 8 [IU] via SUBCUTANEOUS

## 2019-08-13 MED ORDER — PHENOL 1.4 % MT LIQD: 1.0000 | OROMUCOSAL | Status: DC | PRN

## 2019-08-13 MED ORDER — LABETALOL HCL 5 MG/ML IV SOLN: 10.0000 mg | INTRAVENOUS | Status: DC | PRN

## 2019-08-13 MED ORDER — ALBUTEROL SULFATE (2.5 MG/3ML) 0.083% IN NEBU: 2.5000 mg | INHALATION_SOLUTION | Freq: Four times a day (QID) | RESPIRATORY_TRACT | Status: DC | PRN

## 2019-08-13 MED ORDER — VANCOMYCIN HCL 1000 MG IV SOLR
INTRAVENOUS | Status: DC | PRN
  Administered 2019-08-13: 1000 mg via INTRAVENOUS

## 2019-08-13 MED ORDER — EPHEDRINE SULFATE 50 MG/ML IJ SOLN
INTRAMUSCULAR | Status: DC | PRN
  Administered 2019-08-13: 7.5 mg via INTRAVENOUS
  Administered 2019-08-13: 10 mg via INTRAVENOUS

## 2019-08-13 MED ORDER — ONDANSETRON HCL 4 MG/2ML IJ SOLN
INTRAMUSCULAR | Status: AC
  Filled 2019-08-13: qty 2

## 2019-08-13 MED ORDER — ACETAMINOPHEN 325 MG RE SUPP: 325.0000 mg | RECTAL | Status: DC | PRN

## 2019-08-13 MED ORDER — MIDAZOLAM HCL 2 MG/2ML IJ SOLN
INTRAMUSCULAR | Status: AC
  Filled 2019-08-13: qty 2

## 2019-08-13 MED ORDER — DIGOXIN 125 MCG PO TABS
0.1250 mg | ORAL_TABLET | Freq: Every day | ORAL | Status: DC
  Administered 2019-08-14 – 2019-08-17 (×4): 0.125 mg via ORAL
  Filled 2019-08-13 (×4): qty 1

## 2019-08-13 MED ORDER — FENTANYL CITRATE (PF) 250 MCG/5ML IJ SOLN
INTRAMUSCULAR | Status: AC
  Filled 2019-08-13: qty 5

## 2019-08-13 MED ORDER — LIRAGLUTIDE 18 MG/3ML ~~LOC~~ SOPN: 1.8000 mg | PEN_INJECTOR | Freq: Every day | SUBCUTANEOUS | Status: DC

## 2019-08-13 MED ORDER — TORSEMIDE 20 MG PO TABS: 20.0000 mg | ORAL_TABLET | ORAL | Status: DC

## 2019-08-13 MED ORDER — TAMSULOSIN HCL 0.4 MG PO CAPS
0.4000 mg | ORAL_CAPSULE | Freq: Every day | ORAL | Status: DC
  Administered 2019-08-14 – 2019-08-17 (×4): 0.4 mg via ORAL
  Filled 2019-08-13 (×4): qty 1

## 2019-08-13 MED ORDER — LACTATED RINGERS IV SOLN
INTRAVENOUS | Status: DC
  Administered 2019-08-13: 15:00:00 via INTRAVENOUS

## 2019-08-13 MED ORDER — BISACODYL 10 MG RE SUPP: 10.0000 mg | Freq: Every day | RECTAL | Status: DC | PRN

## 2019-08-13 MED ORDER — ACETAMINOPHEN 10 MG/ML IV SOLN: 1000.0000 mg | Freq: Once | INTRAVENOUS | Status: DC | PRN

## 2019-08-13 MED ORDER — TORSEMIDE 20 MG PO TABS
20.0000 mg | ORAL_TABLET | Freq: Every evening | ORAL | Status: DC
  Administered 2019-08-13 – 2019-08-17 (×5): 20 mg via ORAL
  Filled 2019-08-13 (×5): qty 1

## 2019-08-13 MED ORDER — 0.9 % SODIUM CHLORIDE (POUR BTL) OPTIME
TOPICAL | Status: DC | PRN
  Administered 2019-08-13: 15:00:00 1000 mL

## 2019-08-13 MED ORDER — NITROGLYCERIN 0.4 MG SL SUBL: 0.4000 mg | SUBLINGUAL_TABLET | SUBLINGUAL | Status: DC | PRN

## 2019-08-13 MED ORDER — MIDAZOLAM HCL 2 MG/2ML IJ SOLN
INTRAMUSCULAR | Status: AC
  Administered 2019-08-13: 15:00:00 1 mg via INTRAVENOUS
  Filled 2019-08-13: qty 2

## 2019-08-13 MED ORDER — DEXAMETHASONE SODIUM PHOSPHATE 10 MG/ML IJ SOLN
INTRAMUSCULAR | Status: AC
  Filled 2019-08-13: qty 1

## 2019-08-13 MED ORDER — GABAPENTIN 300 MG PO CAPS
300.0000 mg | ORAL_CAPSULE | Freq: Three times a day (TID) | ORAL | Status: DC
  Administered 2019-08-13 – 2019-08-17 (×12): 300 mg via ORAL
  Filled 2019-08-13 (×12): qty 1

## 2019-08-13 MED ORDER — VITAMIN D 25 MCG (1000 UNIT) PO TABS
1000.0000 [IU] | ORAL_TABLET | Freq: Every day | ORAL | Status: DC
  Administered 2019-08-14 – 2019-08-17 (×4): 1000 [IU] via ORAL
  Filled 2019-08-13 (×4): qty 1

## 2019-08-13 MED ORDER — ESCITALOPRAM OXALATE 10 MG PO TABS
10.0000 mg | ORAL_TABLET | Freq: Every day | ORAL | Status: DC
  Administered 2019-08-13 – 2019-08-16 (×4): 10 mg via ORAL
  Filled 2019-08-13 (×4): qty 1

## 2019-08-13 MED ORDER — MAGNESIUM SULFATE 2 GM/50ML IV SOLN: 2.0000 g | Freq: Every day | INTRAVENOUS | Status: DC | PRN

## 2019-08-13 MED ORDER — POLYETHYLENE GLYCOL 3350 17 G PO PACK: 17.0000 g | PACK | Freq: Every day | ORAL | Status: DC | PRN

## 2019-08-13 MED ORDER — MORPHINE SULFATE (PF) 2 MG/ML IV SOLN
2.0000 mg | INTRAVENOUS | Status: DC | PRN
  Administered 2019-08-13 – 2019-08-14 (×2): 2 mg via INTRAVENOUS
  Filled 2019-08-13 (×3): qty 1

## 2019-08-13 MED ORDER — METOPROLOL TARTRATE 5 MG/5ML IV SOLN: 2.0000 mg | INTRAVENOUS | Status: DC | PRN

## 2019-08-13 MED ORDER — FENTANYL CITRATE (PF) 100 MCG/2ML IJ SOLN
INTRAMUSCULAR | Status: AC
  Administered 2019-08-13: 50 ug via INTRAVENOUS
  Filled 2019-08-13: qty 2

## 2019-08-13 MED ORDER — VANCOMYCIN HCL IN DEXTROSE 1-5 GM/200ML-% IV SOLN
INTRAVENOUS | Status: AC
  Filled 2019-08-13: qty 200

## 2019-08-13 MED ORDER — OXYCODONE-ACETAMINOPHEN 5-325 MG PO TABS
1.0000 | ORAL_TABLET | ORAL | Status: DC | PRN
  Administered 2019-08-13 – 2019-08-16 (×11): 2 via ORAL
  Administered 2019-08-16: 1 via ORAL
  Administered 2019-08-16 – 2019-08-17 (×4): 2 via ORAL
  Filled 2019-08-13: qty 1
  Filled 2019-08-13 (×15): qty 2

## 2019-08-13 MED ORDER — BACITRACIN ZINC 500 UNIT/GM EX OINT
TOPICAL_OINTMENT | CUTANEOUS | Status: AC
  Filled 2019-08-13: qty 28.35

## 2019-08-13 MED ORDER — METOPROLOL SUCCINATE ER 25 MG PO TB24
25.0000 mg | ORAL_TABLET | Freq: Two times a day (BID) | ORAL | Status: DC
  Administered 2019-08-13 – 2019-08-17 (×8): 25 mg via ORAL
  Filled 2019-08-13 (×8): qty 1

## 2019-08-13 MED ORDER — HYDRALAZINE HCL 20 MG/ML IJ SOLN: 5.0000 mg | INTRAMUSCULAR | Status: DC | PRN

## 2019-08-13 MED ORDER — SODIUM CHLORIDE 0.9 % IV SOLN
INTRAVENOUS | Status: DC | PRN
  Administered 2019-08-13: 35 ug/min via INTRAVENOUS
  Administered 2019-08-13: 25 ug/min via INTRAVENOUS
  Administered 2019-08-13: 16:00:00 50 ug/min via INTRAVENOUS

## 2019-08-13 MED ORDER — TORSEMIDE 20 MG PO TABS
40.0000 mg | ORAL_TABLET | Freq: Every day | ORAL | Status: DC
  Administered 2019-08-14 – 2019-08-17 (×4): 40 mg via ORAL
  Filled 2019-08-13 (×4): qty 2

## 2019-08-13 MED ORDER — DEXAMETHASONE SODIUM PHOSPHATE 10 MG/ML IJ SOLN
INTRAMUSCULAR | Status: DC | PRN
  Administered 2019-08-13: 10 mg

## 2019-08-13 MED ORDER — FENOFIBRATE 160 MG PO TABS
160.0000 mg | ORAL_TABLET | Freq: Every day | ORAL | Status: DC
  Administered 2019-08-14 – 2019-08-17 (×4): 160 mg via ORAL
  Filled 2019-08-13 (×4): qty 1

## 2019-08-13 MED ORDER — ACETAMINOPHEN 10 MG/ML IV SOLN
INTRAVENOUS | Status: AC
  Filled 2019-08-13: qty 100

## 2019-08-13 MED ORDER — DOCUSATE SODIUM 100 MG PO CAPS
100.0000 mg | ORAL_CAPSULE | Freq: Every day | ORAL | Status: DC
  Administered 2019-08-15 – 2019-08-17 (×3): 100 mg via ORAL
  Filled 2019-08-13 (×4): qty 1

## 2019-08-13 MED ORDER — POTASSIUM CHLORIDE CRYS ER 20 MEQ PO TBCR: 20.0000 meq | EXTENDED_RELEASE_TABLET | Freq: Every day | ORAL | Status: DC | PRN

## 2019-08-13 MED ORDER — ONDANSETRON HCL 4 MG/2ML IJ SOLN: 4.0000 mg | Freq: Four times a day (QID) | INTRAMUSCULAR | Status: DC | PRN

## 2019-08-13 MED ORDER — EZETIMIBE 10 MG PO TABS
10.0000 mg | ORAL_TABLET | Freq: Every day | ORAL | Status: DC
  Administered 2019-08-13 – 2019-08-17 (×5): 10 mg via ORAL
  Filled 2019-08-13 (×5): qty 1

## 2019-08-13 SURGICAL SUPPLY — 50 items
BANDAGE ESMARK 6X9 LF (GAUZE/BANDAGES/DRESSINGS) ×1 IMPLANT
BLADE SAGITTAL 25.0X1.19X90 (BLADE) ×2 IMPLANT
BLADE SAGITTAL 25.0X1.19X90MM (BLADE) ×1
BLADE SAW SAG 73X25 THK (BLADE) ×1
BLADE SAW SGTL 73X25 THK (BLADE) ×2 IMPLANT
BNDG COHESIVE 6X5 TAN STRL LF (GAUZE/BANDAGES/DRESSINGS) ×3 IMPLANT
BNDG ELASTIC 4X5.8 VLCR STR LF (GAUZE/BANDAGES/DRESSINGS) ×3 IMPLANT
BNDG ELASTIC 6X5.8 VLCR STR LF (GAUZE/BANDAGES/DRESSINGS) ×3 IMPLANT
BNDG ESMARK 6X9 LF (GAUZE/BANDAGES/DRESSINGS) ×3
BNDG GAUZE ELAST 4 BULKY (GAUZE/BANDAGES/DRESSINGS) ×3 IMPLANT
CANISTER SUCT 3000ML PPV (MISCELLANEOUS) ×3 IMPLANT
CLIP VESOCCLUDE MED 6/CT (CLIP) ×3 IMPLANT
COVER BACK TABLE 60X90IN (DRAPES) ×3 IMPLANT
COVER SURGICAL LIGHT HANDLE (MISCELLANEOUS) ×6 IMPLANT
COVER WAND RF STERILE (DRAPES) ×3 IMPLANT
DRAIN CHANNEL 19F RND (DRAIN) IMPLANT
DRAPE HALF SHEET 40X57 (DRAPES) ×3 IMPLANT
DRAPE ORTHO SPLIT 77X108 STRL (DRAPES) ×4
DRAPE SURG ORHT 6 SPLT 77X108 (DRAPES) ×2 IMPLANT
DRAPE U-SHAPE 47X51 STRL (DRAPES) ×3 IMPLANT
DRSG ADAPTIC 3X8 NADH LF (GAUZE/BANDAGES/DRESSINGS) ×3 IMPLANT
ELECT CAUTERY BLADE 6.4 (BLADE) ×3 IMPLANT
ELECT REM PT RETURN 9FT ADLT (ELECTROSURGICAL) ×3
ELECTRODE REM PT RTRN 9FT ADLT (ELECTROSURGICAL) ×1 IMPLANT
EVACUATOR SILICONE 100CC (DRAIN) IMPLANT
GAUZE SPONGE 4X4 12PLY STRL (GAUZE/BANDAGES/DRESSINGS) ×6 IMPLANT
GAUZE SPONGE 4X4 12PLY STRL LF (GAUZE/BANDAGES/DRESSINGS) ×3 IMPLANT
GLOVE BIO SURGEON STRL SZ7.5 (GLOVE) ×9 IMPLANT
GLOVE BIOGEL PI IND STRL 8 (GLOVE) ×1 IMPLANT
GLOVE BIOGEL PI INDICATOR 8 (GLOVE) ×2
GOWN STRL REUS W/ TWL XL LVL3 (GOWN DISPOSABLE) ×1 IMPLANT
GOWN STRL REUS W/TWL XL LVL3 (GOWN DISPOSABLE) ×2
KIT BASIN OR (CUSTOM PROCEDURE TRAY) ×3 IMPLANT
KIT TURNOVER KIT B (KITS) ×3 IMPLANT
NS IRRIG 1000ML POUR BTL (IV SOLUTION) ×3 IMPLANT
PACK GENERAL/GYN (CUSTOM PROCEDURE TRAY) ×3 IMPLANT
PAD ARMBOARD 7.5X6 YLW CONV (MISCELLANEOUS) ×6 IMPLANT
SPONGE LAP 18X18 X RAY DECT (DISPOSABLE) ×3 IMPLANT
STAPLER VISISTAT 35W (STAPLE) ×3 IMPLANT
STOCKINETTE IMPERVIOUS LG (DRAPES) ×3 IMPLANT
SUT ETHILON 3 0 PS 1 (SUTURE) IMPLANT
SUT SILK 0 TIES 10X30 (SUTURE) ×3 IMPLANT
SUT SILK 2 0 (SUTURE) ×2
SUT SILK 2 0 SH CR/8 (SUTURE) ×3 IMPLANT
SUT SILK 2-0 18XBRD TIE 12 (SUTURE) ×1 IMPLANT
SUT VIC AB 2-0 CT1 18 (SUTURE) ×9 IMPLANT
SUT VIC AB 3-0 SH 18 (SUTURE) IMPLANT
TOWEL GREEN STERILE (TOWEL DISPOSABLE) ×6 IMPLANT
UNDERPAD 30X30 (UNDERPADS AND DIAPERS) ×3 IMPLANT
WATER STERILE IRR 1000ML POUR (IV SOLUTION) ×3 IMPLANT

## 2019-08-13 NOTE — Progress Notes (Signed)
[facility] [place]  Subjective  - No complaints.  INR 1.8 after IV vitamin K.   Objective (!) 135/59 64 97.8 F (36.6 C) (Oral) 18 94%  Intake/Output Summary (Last 24 hours) at 08/13/2019 0838 Last data filed at 08/13/2019 0300 Gross per 24 hour  Intake 590 ml  Output 3400 ml  Net -2810 ml    Left BKA non-healing.  Laboratory Lab Results: Recent Labs    08/11/19 0421 08/12/19 1309  WBC 13.9* 14.6*  HGB 8.6* 8.6*  HCT 27.7* 27.6*  PLT 270 270   BMET Recent Labs    08/11/19 0421  NA 135  K 4.2  CL 98  CO2 27  GLUCOSE 186*  BUN 41*  CREATININE 1.47*  CALCIUM 9.2    COAG Lab Results  Component Value Date   INR 1.8 (H) 08/12/2019   INR 4.4 (HH) 08/12/2019   INR 3.7 (H) 08/11/2019   No results found for: PTT  Assessment/Planning:  Plan for conversion of left BKA to AKA today given non-healing left BKA.   Ultimately he wants to move forward with his heart surgery as his primary goal and get his amp healed in setting of severe tibial disease.  Discussed options of BKA revision and possible leaving this open with a VAC versus converting him to an AKA.  He wants to proceed with AKA.  Marty Heck 08/13/2019 8:38 AM --

## 2019-08-13 NOTE — Progress Notes (Signed)
ANTICOAGULATION CONSULT NOTE - Follow Up Consult  Pharmacy Consult to reverse warfarin and start Hep IV when INR<2.5 Indication: Mech AVR  Allergies  Allergen Reactions  . Iohexol Anaphylaxis  . Niacin And Related     Flushing with immediate realese  . Penicillins Other (See Comments)    Unknown.[name]  Did it involve swelling of the face/tongue/throat, SOB, or low BP? Unknown Did it involve sudden or severe rash/hives, skin peeling, or any reaction on the inside of your mouth or nose? Unknown Did you need to seek medical attention at a hospital or doctor's office? Unknown When did it last happen?Childhood If all above answers are "NO", may proceed with cephalosporin use.    Patient Measurements: Height: 5\' 11"  (180.3 cm) Weight: 238 lb 5.1 oz (108.1 kg) IBW/kg (Calculated) : 75.3   Vital Signs: Temp: 99 F (37.2 C) (09/09 1918) Temp Source: Oral (09/09 1918) BP: 146/54 (09/09 1918) Pulse Rate: 75 (09/09 1918)  Labs: Recent Labs    08/10/19 0341 08/11/19 0421 08/12/19 0445 08/12/19 1309 08/12/19 2350  HGB 9.0* 8.6*  --  8.6*  --   HCT 28.5* 27.7*  --  27.6*  --   PLT 270 270  --  270  --   LABPROT 29.7* 36.2* 41.1*  --  20.5*  INR 2.9* 3.7* 4.4*  --  1.8*  HEPARINUNFRC 0.40  --   --   --   --   CREATININE 1.84* 1.47*  --   --   --     Estimated Creatinine Clearance: 58.5 mL/min (A) (by C-G formula based on SCr of 1.47 mg/dL (H)).   Assessment: 30 YOM who has been continued warfarin for mechanical AVR however now with plans for potential AKA on 9/10 AM if INR reduced enough. Pharmacy has been consulted to aid with INR reversal and Heparin bridge when INR<2.5  PTA warfarin dose:  10 mg daily except 12.5mg  TTS, admit INR 2.7 (goal 2.5-3.5)  INR earlier today was SUPRAtherapeutic at 4.4, Vit K 10 mg IV x 1 dose has been ordered by the rehab PA Reesa Chew) however this high dose of Vit K can lead to residual warfarin resistance for >/= 1  week. Given the patient's mechanical valve - will reduce this initial dose to 5 mg IV x 1, will plan to recheck an INR in 6 hours and repeat the dose if it remains >2.5. Once the INR is down to <2.5 - will plan to start a Heparin bridge.     9/10 AM update:  INR is now 1.8 after reversal  Plan to start heparin bridge to surgery   Goal of Therapy:  Heparin level 0.3-0.7 units/mL INR 2.5-3.5 Monitor platelets by anticoagulation protocol: Yes   Plan:  -Start heparin drip at 1400 units/hr -Check heparin level at 0930  -Daily CBC/HL/INR -Monitor for bleeding -F/U vascular surgery plans  Narda Bonds, PharmD, BCPS Clinical Pharmacist Phone: [phone]

## 2019-08-13 NOTE — Progress Notes (Signed)
ANTICOAGULATION CONSULT NOTE - Follow Up Consult  Pharmacy Consult to reverse warfarin and start Hep IV when INR<2.5 Indication: Mech AVR  Allergies  Allergen Reactions  . Iohexol Anaphylaxis  . Niacin And Related     Flushing with immediate realese  . Penicillins Other (See Comments)    Unknown.Joseph Kitchenaortic stenosis a child  Did it involve swelling of the face/tongue/throat, SOB, or low BP? Unknown Did it involve sudden or severe rash/hives, skin peeling, or any reaction on the inside of your mouth or nose? Unknown Did you need to seek medical attention at a hospital or doctor's office? Unknown When did it last happen?Childhood If all above answers are "NO", may proceed with cephalosporin use.    Patient Measurements: Height: 5\' 11"  (180.3 cm) Weight: 234 lb 9.1 oz (106.4 kg) IBW/kg (Calculated) : 75.3 Heparin dosing wt: 100.5 kg  Vital Signs: Temp: 97.9 F (36.6 C) (09/10 1101) Temp Source: Oral (09/10 1101) BP: 134/57 (09/10 1101) Pulse Rate: 60 (09/10 1101)  Labs: Recent Labs    08/11/19 0421 08/12/19 0445 08/12/19 1309 08/12/19 2350 08/13/19 1042  HGB 8.6*  --  8.6*  --  9.4*  HCT 27.7*  --  27.6*  --  29.8*  PLT 270  --  270  --  270  LABPROT 36.2* 41.1*  --  20.5* 16.9*  INR 3.7* 4.4*  --  1.8* 1.4*  HEPARINUNFRC  --   --   --   --  0.12*  CREATININE 1.47*  --   --   --   --     Estimated Creatinine Clearance: 58 mL/min (A) (by C-G formula based on SCr of 1.47 mg/dL (H)).   Assessment: 55 YOM who has been continued warfarin for mechanical AVR; however, now with plans for potential AKA on 9/10 and has been transitioned to heparin after INR reversed with vitamin K on 9/9 and warfarin held. INR 1.8 this AM. Initial heparin level low at 0.12 (previously therapeutic on heparin at 2300 units/hr). CBC stable. No bleeding or issues with infusion per discussion with RN.  Goal of Therapy:  Heparin level 0.3-0.7 units/mL INR 2.5-3.5 Monitor platelets by  anticoagulation protocol: Yes   Plan:  Increase heparin to 1800 units/hr 8h heparin level Monitor daily heparin level and CBC, s/sx bleeding F/u anticoagulation plan post-AKA  [name], PharmD, BCPS Please check AMION for all [place] contact numbers Clinical Pharmacist 08/13/2019 11:46 AM

## 2019-08-13 NOTE — Progress Notes (Signed)
ANTICOAGULATION CONSULT NOTE - Follow Up Consult  Pharmacy Consult for Heparin (while INR<2.5) Indication: Mech AVR  Allergies  Allergen Reactions  . Iohexol Anaphylaxis  . Niacin And Related     Flushing with immediate realese  . Penicillins Other (See Comments)    Unknown.[name]  Did it involve swelling of the face/tongue/throat, SOB, or low BP? Unknown Did it involve sudden or severe rash/hives, skin peeling, or any reaction on the inside of your mouth or nose? Unknown Did you need to seek medical attention at a hospital or doctor's office? Unknown When did it last happen?Childhood If all above answers are "NO", may proceed with cephalosporin use.    Patient Measurements: Height: 5\' 11"  (180.3 cm) Weight: 227 lb 15.3 oz (103.4 kg)(post AKA) IBW/kg (Calculated) : 75.3 Heparin dosing wt: 100.5 kg  Vital Signs: Temp: 97.7 F (36.5 C) (09/10 2025) Temp Source: Oral (09/10 2025) BP: 118/60 (09/10 2025) Pulse Rate: 75 (09/10 2025)  Labs: Recent Labs    08/11/19 0421 08/12/19 0445 08/12/19 1309 08/12/19 2350 08/13/19 1042  HGB 8.6*  --  8.6*  --  9.4*  HCT 27.7*  --  27.6*  --  29.8*  PLT 270  --  270  --  270  LABPROT 36.2* 41.1*  --  20.5* 16.9*  INR 3.7* 4.4*  --  1.8* 1.4*  HEPARINUNFRC  --   --   --   --  0.12*  CREATININE 1.47*  --   --   --   --     Estimated Creatinine Clearance: 57.2 mL/min (A) (by C-G formula based on SCr of 1.47 mg/dL (H)).   Assessment: 34 YOM who has been continued warfarin for mechanical AVR. Warfarin was held on 9/9 and the INR reversed with Vit K for plans for AKA on 9/10 - now completed. Pharmacy has been consulted to resume Heparin without a bolus on 9/11 AM.   A heparin level drawn earlier on 9/10 was low and the rate was increased to 1800 units/hr - will resume at a similar rate but slightly reduced since s/p AKA.   Goal of Therapy:  Heparin level 0.3-0.7 units/mL INR 2.5-3.5 Monitor platelets by  anticoagulation protocol: Yes   Plan:  - Resume Heparin at a rate of 1700 units/hr starting on 9/11 AM - Will f/u plans to restart warfarin - Will continue to monitor for any signs/symptoms of bleeding and will follow up with heparin level in 6 hours   Thank you for allowing pharmacy to be a part of this patient's care.  Alycia Rossetti, PharmD, BCPS Clinical Pharmacist Clinical phone for 08/13/2019: [phone] 08/13/2019 9:18 PM   **Pharmacist phone directory can now be found on [email or web address] ([email or web address]).  Listed under [place].

## 2019-08-13 NOTE — Op Note (Signed)
Date: August 13, 2019  Preoperative diagnosis: Nonhealing and necrotic left below knee amputation  Postoperative diagnosis: Same  Procedure: Left above-knee amputation  Surgeon: Dr. Marty Heck, MD  Assistant: Leontine Locket, PA  Indication: Patient is a 68 year old male who unfortunately has had a prolonged course after he presented with critical limb ischemia with tissue loss of the left lower extremity.  Initially underwent arteriogram and had inline flow down the peroneal in the setting of severe tibial disease and had a toe amputation that failed to heal.  Ultimately he was converted to a below-knee amputation and that subsequently failed to heal as well.  We offered him the option of BKA revision versus conversion to above-knee amputation and he chose to proceed with above-knee amputation after risk benefits were discussed including ongoing failure of wound healing.  Findings: Left above-knee amputation with healthy tissue margins and good bleeding in the wound bed.  Anesthesia: Regional and spinal  Details: Patient was taken to the operating room after informed consent was obtained.  He did get a regional block in holding prior to going back to the OR.  He was then taken to the operating room placed on the OR table and ultimately got a spinal block as well.  Once his anesthesia was satisfactory we then prepped his left below-knee amputation as well as his left thigh.  Preop timeout was performed to identify patient procedure and site.  He did get preoperative antibiotics.  Initially marked out the location for femur transection 1 handsbreadth above the patella and this was marked on the skin.  I then made a fishmouth incision with a 15 blade scalpel.  I then carried this down through the subcutaneous tissue and opened the muscle fascia with Bovie cautery.  All the muscle was divided anteriorly again creating a fishmouth type incision and having a nice muscle flap for closure  anteriorly and posteriorly.  Ultimately I developed a plane posterior to the femur and then the femur was transected with an oscillating saw.  After this was transected I used a bone hook and then I found the femoral vessels posteriorly and these were clamped with Kelly clamps and divided.  I then completed the posterior flap with Bovie cautery.  That point in time I then tied off the femoral artery and femoral vein with 2-0 Vicryl silk suture twice as well as a 0 silk tie.  All other bleeding was controlled with hemostats and tied off with 2-0 silk ties.  The wound was then copiously irrigated with saline until the effluent was clear.  I then reapproximated the muscle fascia with 2-0 Vicryl in interrupted fashion until I could not insert an index finger into the fascia.  I then closed the skin with staples.  Dry sterile dressing was applied.  He tolerated this without any apparent complications.  Complication: None  Condition: Stable  Marty Heck, MD [facility] [place] Office: [phone] Pager: [place]

## 2019-08-13 NOTE — Anesthesia Procedure Notes (Signed)
Spinal  Patient location during procedure: OR Start time: 08/13/2019 3:10 PM End time: 08/13/2019 3:18 PM Staffing Anesthesiologist: Pervis Hocking, DO Performed: anesthesiologist  Preanesthetic Checklist Completed: patient identified, surgical consent, pre-op evaluation, timeout performed, IV checked, risks and benefits discussed and monitors and equipment checked Spinal Block Patient position: sitting Prep: site prepped and draped and DuraPrep Patient monitoring: cardiac monitor, continuous pulse ox and blood pressure Approach: midline Location: L3-4 Injection technique: single-shot Needle Needle type: Pencan  Needle gauge: 24 G Needle length: 9 cm Assessment Sensory level: T6 Additional Notes Functioning IV was confirmed and monitors were applied. Sterile prep and drape, including hand hygiene and sterile gloves were used. The patient was positioned and the spine was prepped. The skin was anesthetized with lidocaine.  Free flow of clear CSF was obtained prior to injecting local anesthetic into the CSF.  The spinal needle aspirated freely following injection.  The needle was carefully withdrawn.  The patient tolerated the procedure well.

## 2019-08-13 NOTE — Progress Notes (Addendum)
Pt arrived to [facility] from PACU. Pt given CHG bath and telemetry applied. Assessments complete. Pt given something to eat. Pt A&O. Dressings c,d,i. Pt oriented to room. Waiting for pharmacy to release meds as pt c/o pain in L AKA. Bed in lowest position and call bell within reach. Will continue to monitor. Lajoyce Corners, RN

## 2019-08-13 NOTE — Progress Notes (Signed)
Called the Pharmacist to verify the order for the Heparin. Due to the the patient having surgery in the morning. He confirmed that it is okay for the patient to start the heparin and states the OR will call and left nurse know when to discontinue the medication.

## 2019-08-13 NOTE — Progress Notes (Signed)
Joseph Hoffman PHYSICAL MEDICINE & REHABILITATION PROGRESS NOTE   Subjective/Complaints: Patient reports ready to go forward with L AKA- INR down to 1.8 so ready to proceed with surgery.  Asking to be cathed  ROS: +Myalgia.  Denies CP, shortness of breath, nausea, vomiting, diarrhea.  Objective:   No results found. Recent Labs    08/11/19 0421 08/12/19 1309  WBC 13.9* 14.6*  HGB 8.6* 8.6*  HCT 27.7* 27.6*  PLT 270 270   Recent Labs    08/11/19 0421  NA 135  K 4.2  CL 98  CO2 27  GLUCOSE 186*  BUN 41*  CREATININE 1.47*  CALCIUM 9.2    Intake/Output Summary (Last 24 hours) at 08/13/2019 0945 Last data filed at 08/13/2019 0848 Gross per 24 hour  Intake 590 ml  Output 4400 ml  Net -3810 ml     Physical Exam: Vital Signs Blood pressure (!) 135/59, pulse 64, temperature 97.8 F (36.6 C), temperature source Oral, resp. rate 18, height 5\' 11"  (1.803 m), weight 106.4 kg, SpO2 94 %. Constitutional: No distress . Vital signs reviewed. Pt awake, still sleepy; appropriate, lying in bed, NAD HENT: Normocephalic.  Atraumatic. Eyes: EOMI. No discharge. Cardiovascular: No JVD. Respiratory: Normal effort.  No stridor. GI: Non-distended. Skin: Warm and dry.  Intact. Psych: Normal mood.  Normal behavior. Musc: Left stump with edema and tenderness.  Tender to palpation left hamstrings- no wound on L hamstring Neurological: He is alert and oriented Motor: B/l UE, RLE: 5/5 proximal to distal, unchanged LLE: HF 3/5 (pain inhibition), unchanged RLE: 5/5 proximal distal Sensation grossly intact to light touch  Skin: He is not diaphoretic.  Left stump with erythema and edema.  Central incision with dark area. Per pic from PA, looks darker around incision line than over weekend Psychiatric: He has a normal mood and affect. His behavior is normal.      Assessment/Plan: 1. Functional deficits secondary to L BKA which require 3+ hours per day of interdisciplinary therapy in a  comprehensive inpatient rehab setting.  Physiatrist is providing close team supervision and 24 hour management of active medical problems listed below.  Physiatrist and rehab team continue to assess barriers to discharge/monitor patient progress toward functional and medical goals  Care Tool:  Bathing    Body parts bathed by patient: Right arm, Left arm, Chest, Abdomen, Front perineal area, Right upper leg, Left upper leg, Face, Right lower leg   Body parts bathed by helper: Buttocks Body parts n/a: Left lower leg   Bathing assist Assist Level: Minimal Assistance - Patient > 75%     Upper Body Dressing/Undressing Upper body dressing   What is the patient wearing?: Hospital gown only    Upper body assist Assist Level: Set up assist    Lower Body Dressing/Undressing Lower body dressing      What is the patient wearing?: Underwear/pull up, Pants     Lower body assist Assist for lower body dressing: Moderate Assistance - Patient 50 - 74%     Toileting Toileting    Toileting assist Assist for toileting: Moderate Assistance - Patient 50 - 74%     Transfers Chair/bed transfer  Transfers assist     Chair/bed transfer assist level: Moderate Assistance - Patient 50 - 74% Chair/bed transfer assistive device: Sliding board   Locomotion Ambulation   Ambulation assist   Ambulation activity did not occur: Safety/medical concerns          Walk 10 feet activity   Assist  Walk 10 feet activity did not occur: Safety/medical concerns        Walk 50 feet activity   Assist Walk 50 feet with 2 turns activity did not occur: Safety/medical concerns         Walk 150 feet activity   Assist Walk 150 feet activity did not occur: Safety/medical concerns         Walk 10 feet on uneven surface  activity   Assist Walk 10 feet on uneven surfaces activity did not occur: Safety/medical concerns         Wheelchair     Assist     Wheelchair activity  did not occur: (pain)         Wheelchair 50 feet with 2 turns activity    Assist            Wheelchair 150 feet activity     Assist          Blood pressure (!) 135/59, pulse 64, temperature 97.8 F (36.6 C), temperature source Oral, resp. rate 18, height 5\' 11"  (1.803 m), weight 106.4 kg, SpO2 94 %.     CBG (last 3)  Recent Labs    08/12/19 1715 08/12/19 2108 08/13/19 0613  GLUCAP 177* 253* 151*    Medical Problem List and Plan: 1.  Deficits with mobility, transfers, self-care secondary to left BKA.  Continue CIR 2.  Mechanical AVR/Antithrombotics: -DVT/anticoagulation:  Pharmaceutical: Coumadin             -antiplatelet therapy: N/A  On heparin GGT this a.m., likely DC soon  9/9- pt seen by vascular- offered I&D vs L AKA_ he decided to get AKA- will go in AM if INR has reduced, since supratherapeutic- INR 4.4 Held lantus and amaryl for AM and Vit K given- 10 mg given already. 9/10- INR down to 1.8- will proceed with surgery- might need foley for OR- will let OR place if necessary. Will also recheck labs in AM.  3. Pain Management: Severe hamstring pain  Continue Norco as needed  Valium changed to scheduled Robaxin on 9/7  Educated patient on avoiding knee flexion             Monitor with increased mobility  9/9- going back to OR for AKA 4. Mood: LCSW to follow for evaluation and support.              -antipsychotic agents: N/A 5. Neuropsych: This patient is capable of making decisions on his own behalf. 6. Skin/Wound Care:   Monitor wound for healing and signs and symptoms of infection.   Kerlix with Ace wrap as ordered to stump  Stump protector ordered 7. Fluids/Electrolytes/Nutrition: Strict I/Os.  8. CAD/Systolic CHF: Added HH restrictions to diet. On Lanoxin, Metoprolol and Demadex.              Monitor for signs/symptoms of fluid overload             Daily weights Filed Weights   08/11/19 0500 08/12/19 0500 08/13/19 0407  Weight: 108.8  kg 108.1 kg 106.4 kg   Relatively stable on 9/7 9. Acute on chronic renal failure:   Creatinine 1.84 on 9/7, labs ordered for tomorrow  Encourage fluids  9/8- Cr down to 1.47 and BUN down to 41 10 Leucocytosis: Monitor wound, trend and for other signs of infection.              WBCs 14.8 on 9/7, labs ordered for tomorrow  Monitor stump closely for infection  Afebrile  UA unremarkable, urine culture no growth  9/8- WBC down to 13.9, afebrile, however residual limb most likely cause of leukocytosis- will monitor closely-   9/9- WBC up to 14.6k- vascular called- will go for AKA tomorrow, if INR down. 11. Acute on chronic anemia:             Hemoglobin 9.2 on 9/7 12. COPD: Respiratory status stable on Dulera 13. OSA: Has been compliant with CPAP at nights.  14. T2DM with neuropathy: On Lantus and amaryl with BS reasonable. Monitor BS ac/hs             Monitor with increased mobility  Increase Lantus to 33 units BID on 9/7  Labile on 9/7 15. Anxiety/depression: Managed on Lexapro.  16. Constipation-   Added Senokot 1 tab BID   Increased Colace   9/10- increased senokot to 2 tabs BID and miralax to BID and suppository last night- had BM- will monitor closely 17. Foley/urinary retention  increased Flomax to 0.8 mg QHS to help him void.  Bethanechol 5 3 times daily started on 9/7  Continues to require I/O cath  9/8- volumes >1000-1400cc per staff- will need to fluid restrict pt, since drinking so much. 18.  Sleep disturbance- will give trazodone 50 mg QHS prn. 19. Itching- 9/4-ordered cortisone lotion for back TID  Improving 20.  Hyponatremia  Sodium 130 on 9/7, labs ordered for tomorrow  Continue to monitor  9/8- Na up to 135.  LOS: 8 days A FACE TO FACE EVALUATION WAS PERFORMED  Ellard Nan 08/13/2019, 9:45 AM

## 2019-08-13 NOTE — Discharge Instructions (Signed)
Inpatient Rehab Discharge Instructions  Joseph Hoffman Discharge date and time:  08/13/19 08/13/19 Activities/Precautions/ Functional Status: Activity: no lifting, driving, or strenuous exercise  till cleared by MD Diet: cardiac diet and diabetic diet Wound Care:   Functional status:  ___ No restrictions     ___ Walk up steps independently ___ 24/7 supervision/assistance   ___ Walk up steps with assistance ___ Intermittent supervision/assistance  ___ Bathe/dress independently ___ Walk with walker     ___ Bathe/dress with assistance ___ Walk Independently    ___ Shower independently ___ Walk with assistance    ___ Shower with assistance ___ No alcohol     ___ Return to work/school ________  Special Instructions:    My questions have been answered and I understand these instructions. I will adhere to these goals and the provided educational materials after my discharge from the hospital.  Patient/Caregiver Signature _______________________________ Date __________  Clinician Signature _______________________________________ Date __________  Please bring this form and your medication list with you to all your follow-up doctor's appointments.

## 2019-08-13 NOTE — Progress Notes (Addendum)
Received report from PACU RN on pt coming to [id number]. Lajoyce Corners, RN

## 2019-08-13 NOTE — Progress Notes (Signed)
Noted heparin infusing in midline. Notified phlebotomist pt will need venipuncture for lab orders.

## 2019-08-13 NOTE — Transfer of Care (Signed)
Immediate Anesthesia Transfer of Care Note  Patient: Joseph Hoffman  Procedure(s) Performed: LEFT AMPUTATION ABOVE KNEE (Left Knee)  Patient Location: PACU  Anesthesia Type:Regional and Spinal  Level of Consciousness: awake, alert  and oriented  Airway & Oxygen Therapy: Patient Spontanous Breathing and Patient connected to nasal cannula oxygen  Post-op Assessment: Report given to RN and Post -op Vital signs reviewed and stable  Post vital signs: Reviewed and stable  Last Vitals:  Vitals Value Taken Time  BP 121/47 08/13/19 1635  Temp    Pulse 70 08/13/19 1640  Resp 9 08/13/19 1640  SpO2 100 % 08/13/19 1640  Vitals shown include unvalidated device data.  Last Pain: There were no vitals filed for this visit.       Complications: No apparent anesthesia complications

## 2019-08-13 NOTE — Anesthesia Procedure Notes (Signed)
Anesthesia Regional Block: Femoral nerve block   Pre-Anesthetic Checklist: ,, timeout performed, Correct Patient, Correct Site, Correct Laterality, Correct Procedure, Correct Position, site marked, Risks and benefits discussed,  Surgical consent,  Pre-op evaluation,  At surgeon's request and post-op pain management  Laterality: Left  Prep: Maximum Sterile Barrier Precautions used, chloraprep       Needles:  Injection technique: Single-shot  Needle Type: Echogenic Stimulator Needle     Needle Length: 9cm  Needle Gauge: 22     Additional Needles:   Procedures:,,,, ultrasound used (permanent image in chart),,,,  Narrative:  Start time: 08/13/2019 2:48 PM End time: 08/13/2019 2:58 PM Injection made incrementally with aspirations every 5 mL.  Performed by: Personally  Anesthesiologist: Pervis Hocking, DO  Additional Notes: Monitors applied. No increased pain on injection. No increased resistance to injection. Injection made in 5cc increments. Good needle visualization. Patient tolerated procedure well.

## 2019-08-13 NOTE — Anesthesia Preprocedure Evaluation (Signed)
Anesthesia Evaluation  Patient identified by MRN, date of birth, ID band Patient awake    Reviewed: Allergy & Precautions, NPO status , Patient's Chart, lab work & pertinent test results, reviewed documented beta blocker date and time   Airway Mallampati: II  TM Distance: >3 FB Neck ROM: Full    Dental  (+) Poor Dentition, Dental Advisory Given   Pulmonary asthma , sleep apnea , former smoker,    Pulmonary exam normal breath sounds clear to auscultation       Cardiovascular Exercise Tolerance: Poor METS: < 3 Mets hypertension, Pt. on medications and Pt. on home beta blockers + CAD, + Past MI, + Peripheral Vascular Disease and +CHF  Normal cardiovascular exam+ dysrhythmias + Valvular Problems/Murmurs AS and AI  Rhythm:Regular Rate:Normal   1. The left ventricle has moderate-severely reduced systolic function, with an ejection fraction of 30-35%. Left ventricular diffuse hypokinesis.  2. The right ventricle has severely reduced systolic function. The cavity was mildly enlarged.  3. Left atrial size was severely dilated.  4. Right atrial size was severely dilated.  5. The mitral valve is grossly normal. No evidence of mitral valve stenosis.  6. The tricuspid valve was grossly normal.  7. Aortic valve regurgitation is severe by color flow Doppler.  8. There is evidence of mild plaque in the descending aorta.  9. Moderate to severe global reduction in LV systolic function; mild RVE with severe RV dysfunction; severe biatrial enlargement; s/p mechanical AVR with mean gradient of 14 mmHg (both leaflets appear to be mobile); severe perivalvular AI; mild MR and TR.  Cath 06/2019: 1. Multivessel coronary artery disease, including 50% mid LAD, 80% large OM2, 50% mid RCA, and 80-90% ostial rPDA stenoses. 2. Mildly to moderately elevated left and right heart filling pressures. 3. Mildly reduced Fick cardiac output/index.    Neuro/Psych PSYCHIATRIC DISORDERS Anxiety Depression    GI/Hepatic   Endo/Other  diabetes  Renal/GU CRFRenal disease     Musculoskeletal  (+) Arthritis , Osteoarthritis,    Abdominal (+) + obese,   Peds  Hematology  (+) anemia ,   Anesthesia Other Findings   Reproductive/Obstetrics                             Anesthesia Physical Anesthesia Plan  ASA: IV  Anesthesia Plan: Spinal   Post-op Pain Management:  Regional for Post-op pain   Induction:   PONV Risk Score and Plan: 1  Airway Management Planned: Natural Airway  Additional Equipment: None  Intra-op Plan:   Post-operative Plan:   Informed Consent: I have reviewed the patients History and Physical, chart, labs and discussed the procedure including the risks, benefits and alternatives for the proposed anesthesia with the patient or authorized representative who has indicated his/her understanding and acceptance.       Plan Discussed with: CRNA  Anesthesia Plan Comments: (Patient adamantly refusing GA d/t issues with mental fogginess last time. Heparin off x 4h, INR <1.4 requesting spinal anesthesia. Agreed to proceed with spinal anesthetic with plan to utilize multiple pressors to maintain adequate blood pressure given poor cardiac function.)        Anesthesia Quick Evaluation

## 2019-08-13 NOTE — Anesthesia Postprocedure Evaluation (Signed)
Anesthesia Post Note  Patient: Joseph Hoffman  Procedure(s) Performed: LEFT AMPUTATION ABOVE KNEE (Left Knee)     Anesthesia Post Evaluation  Last Vitals:  Vitals:   08/13/19 1704 08/13/19 1720  BP: (!) 123/57 (!) 109/92  Pulse: (!) 56 (!) 56  Resp: 19 17  Temp:    SpO2: 100% 98%    Last Pain:  Vitals:   08/13/19 1720  PainSc: 0-No pain                 Pervis Hocking

## 2019-08-13 NOTE — Patient Outreach (Signed)
[facility]) Care Management  08/13/2019  [name] 1951-02-04 [id number]    Case Closure  RN with long inpt hospitalization greater then 10 days stay. Pt has had two amputation procedures for BKA and today AKA.  Verified with [facility] hospital liaison pt exceed criteria for [facility] services at this time and case was closed.   Will notify pt's primary provider of pt's disposition with [facility] services.   Raina Mina, RN Care Management Coordinator [place] Office [phone]

## 2019-08-13 NOTE — Progress Notes (Signed)
Social Work Discharge Note   The overall goal for the admission was met for:   Discharge location: No-TRANSFERRING TO OR FOR MORE SURGERY-AKA  Length of Stay: No-8 DAYS  Discharge activity level: No-MIN-MOD LEVEL  Home/community participation: Yes  Services provided included: MD, RD, PT, OT, RN, CM, Pharmacy, Neuropsych and SW  Financial Services: Medicare and Private Insurance: [employer]  Follow-up services arranged: Other: OR FOR AKA  Comments (or additional information):  Patient/Family verbalized understanding of follow-up arrangements: Yes  Individual responsible for coordination of the follow-up plan: SELF  Confirmed correct DME delivered: Dupree, Rebecca G 08/13/2019    Dupree, Rebecca G 

## 2019-08-13 NOTE — Discharge Summary (Signed)
Physician Discharge Summary  Patient ID: Joseph Hoffman MRN: [id number] DOB/AGE: [birth year] 70 y.o.  Admit date: 08/05/2019 Discharge date: 08/13/2019  Discharge Diagnoses:  Principal Problem:   Nonhealing surgical wound Active Problems:   Permanent atrial fibrillation, since 1994   Stage 3 chronic kidney disease (HCC)   Unilateral complete BKA, left, initial encounter ([place])   Insomnia due to medical condition   Drug-induced constipation   Anticoagulated on Coumadin   Urinary retention   Type 2 diabetes mellitus with peripheral neuropathy (HCC)   Chronic combined systolic and diastolic CHF (congestive heart failure) (HCC)   Myalgia   Discharged Condition: stable   Significant Diagnostic Studies: N/A   Labs:  Basic Metabolic Panel: Recent Labs  Lab 08/07/19 0546 08/09/19 0356 08/10/19 0341 08/11/19 0421  NA 136 133* 130* 135  K 4.2 4.3 4.3 4.2  CL 99 96* 96* 98  CO2 27 27 24 27   GLUCOSE 156* 181* 243* 186*  BUN 51* 51* 50* 41*  CREATININE 1.47* 1.76* 1.84* 1.47*  CALCIUM 8.9 9.1 9.0 9.2    CBC: Recent Labs  Lab 08/10/19 0341 08/11/19 0421 08/12/19 1309  WBC 14.8* 13.9* 14.6*  NEUTROABS 12.0* 10.9* 11.7*  HGB 9.0* 8.6* 8.6*  HCT 28.5* 27.7* 27.6*  MCV 84.3 85.5 86.5  PLT 270 270 270    CBG: Recent Labs  Lab 08/12/19 0627 08/12/19 1226 08/12/19 1715 08/12/19 2108 08/13/19 0613  GLUCAP 139* 181* 177* 253* 151*    Brief HPI:   Joseph Hoffman is a 68 y.o. male with history of HTN, T2DM, CAF, mechanical AVR- on coumadin, CAD--pending CABG, CKD, OSA- CPAP with 2 L oxygen at nights, left great toe amputation with poor healing and purulent drainage. He was admitted on 07/29/19 with N/V, diarrhea and left foot infection. He was started on IV antibiotics and underwent L-BKA on 07/31/19 by Dr. Carlis Abbott. Post op course complicated by lethargy and confusion felt to be due to narcotics, hypotension as well as AKI. Delene Loll was discontinued and he continues on  heparin/coumadin bridge as INR subtherapeutic. He continued to be limited by pain as well as neuropathy.  CIR recommended due to functional decline.    Hospital Course: Joseph Hoffman was admitted to rehab 08/05/2019 for inpatient therapies to consist of PT and OT at least three hours five days a week. Past admission physiatrist, therapy team and rehab RN have worked together to provide customized collaborative inpatient rehab.  Dilaudid was discontinued and pain has been managed with transition to oxycodone prn. During patient's stay in rehab team conferences were held to monitor patient's progress, set goals and discuss barriers to discharge. At admission, patient required max assist with ADL tasks and total assist on mobility. Acute on chronic renal failure has been monitored with serial check and SCr has improved.  Diet was modified to [facility] and his weights were monitored daily -- no signs of fluid overload noted. Respiratory status has been stable on Dulera.   Diabetes has been monitored with ac/hs CBG checks and SSI was use prn for tighter BS control. Follow up CBC showed persistent leucocytosis without fevers and ABLA is stable. Foley was removed past admission and urine culture done 9/3 and 9/6 which were negative for UTI. He continues to have problems voiding with urinary retention. PVRs showed volumes of [phone] cc qid therefore he was placed on 1500 cc/FR. Recommend continue monitoring PVR/Bladder scan every 4-6 hours and cath for volumes > 350 cc. His wound has been monitored  for healing. He had increase in pain and edema on 09/09 and was found to have increase in ischemia of wound edges. Dr.Clark was consulted and recommended surgical intervention. Patient elected on revision of wound to L-AKA. Coumadin was reversed with vitamin K with INR down to 1.4 on 9/10 am. He was discharged to acute for surgery on 08/13/19.   Current Medications:     Diet: Heart Healthy/Carb Modified. 1500 cc/FR.    Special Instructions: 1. INR goal 2.5-3.5 due to AVR. 2. Monitor BS ac/hs with SSI for elevated BS 3. Monitor voiding with PVR checks with bladder scanner every 4-6 hours and cath for volumes > 350 cc.  Disposition: Acute Hospital    Follow-up Information    Lovorn, Jinny Blossom, MD Follow up.   Specialty: Physical Medicine and Rehabilitation Why: Office will call you with follow up appointment Contact information: [id number] [place] [address] [phone]        Marty Heck, MD Follow up.   Specialty: Vascular Surgery Contact information: [place] [address] [phone]        Orpah Melter, MD. Call in 1 day(s).   Specialty: Family Medicine Why: for post hospital follow up Contact information: [address] [place] [address] [phone]        Troy Sine, MD .   Specialty: Cardiology Contact information: [address] [place] [address] [phone]           Signed: Bary Leriche 08/17/2019, 1:23 AM

## 2019-08-14 ENCOUNTER — Other Ambulatory Visit: Payer: Self-pay

## 2019-08-14 ENCOUNTER — Encounter (HOSPITAL_COMMUNITY): Payer: Self-pay | Admitting: Vascular Surgery

## 2019-08-14 LAB — CBC
HCT: 28.3 % — ABNORMAL LOW (ref 39.0–52.0)
Hemoglobin: 8.7 g/dL — ABNORMAL LOW (ref 13.0–17.0)
MCH: 26.4 pg (ref 26.0–34.0)
MCHC: 30.7 g/dL (ref 30.0–36.0)
MCV: 85.8 fL (ref 80.0–100.0)
Platelets: 276 10*3/uL (ref 150–400)
RBC: 3.3 MIL/uL — ABNORMAL LOW (ref 4.22–5.81)
RDW: 19 % — ABNORMAL HIGH (ref 11.5–15.5)
WBC: 6.6 10*3/uL (ref 4.0–10.5)
nRBC: 0 % (ref 0.0–0.2)

## 2019-08-14 LAB — GLUCOSE, CAPILLARY
Glucose-Capillary: 362 mg/dL — ABNORMAL HIGH (ref 70–99)
Glucose-Capillary: 406 mg/dL — ABNORMAL HIGH (ref 70–99)
Glucose-Capillary: 300 mg/dL — ABNORMAL HIGH (ref 70–99)
Glucose-Capillary: 318 mg/dL — ABNORMAL HIGH (ref 70–99)

## 2019-08-14 LAB — BASIC METABOLIC PANEL
Anion gap: 10 (ref 5–15)
BUN: 44 mg/dL — ABNORMAL HIGH (ref 8–23)
CO2: 25 mmol/L (ref 22–32)
Calcium: 9.2 mg/dL (ref 8.9–10.3)
Chloride: 100 mmol/L (ref 98–111)
Creatinine, Ser: 1.71 mg/dL — ABNORMAL HIGH (ref 0.61–1.24)
GFR calc Af Amer: 46 mL/min — ABNORMAL LOW (ref >60)
GFR calc non Af Amer: 40 mL/min — ABNORMAL LOW (ref >60)
Glucose, Bld: 381 mg/dL — ABNORMAL HIGH (ref 70–99)
Potassium: 5.1 mmol/L (ref 3.5–5.1)
Sodium: 135 mmol/L (ref 135–145)

## 2019-08-14 LAB — HEPARIN LEVEL (UNFRACTIONATED)
Heparin Unfractionated: 0.22 IU/mL — ABNORMAL LOW (ref 0.30–0.70)
Heparin Unfractionated: 0.27 IU/mL — ABNORMAL LOW (ref 0.30–0.70)
Heparin Unfractionated: 0.63 IU/mL (ref 0.30–0.70)

## 2019-08-14 LAB — GLUCOSE, RANDOM: Glucose, Bld: 383 mg/dL — ABNORMAL HIGH (ref 70–99)

## 2019-08-14 MED ORDER — SODIUM CHLORIDE 0.9 % IV BOLUS
500.0000 mL | Freq: Once | INTRAVENOUS | Status: AC
  Administered 2019-08-14: 500 mL via INTRAVENOUS

## 2019-08-14 MED ORDER — MORPHINE SULFATE (PF) 2 MG/ML IV SOLN
1.0000 mg | INTRAVENOUS | Status: DC | PRN
  Administered 2019-08-16 – 2019-08-17 (×3): 2 mg via INTRAVENOUS
  Filled 2019-08-14 (×3): qty 1

## 2019-08-14 MED ORDER — INSULIN GLARGINE 100 UNIT/ML ~~LOC~~ SOLN: 33.0000 [IU] | Freq: Two times a day (BID) | SUBCUTANEOUS | Status: DC

## 2019-08-14 MED ORDER — NALOXONE HCL 0.4 MG/ML IJ SOLN
INTRAMUSCULAR | Status: AC
  Administered 2019-08-14: 0.4 mg
  Filled 2019-08-14: qty 1

## 2019-08-14 MED ORDER — INSULIN GLARGINE 100 UNIT/ML ~~LOC~~ SOLN
33.0000 [IU] | Freq: Two times a day (BID) | SUBCUTANEOUS | Status: DC
  Administered 2019-08-14 – 2019-08-15 (×4): 33 [IU] via SUBCUTANEOUS
  Filled 2019-08-14 (×6): qty 0.33

## 2019-08-14 NOTE — Progress Notes (Signed)
Patient stated he wear CPAP at night and when moved from 4 room on 4M11 patient CPAP and equipment did not make the trip as well. Patient reordered CPAP and setup for night time. With O2 bleed in. Patient places himself on and off and will call if any further assistance needed.

## 2019-08-14 NOTE — Consult Note (Signed)
PV Navigator consult acknowledged and chart reviewed.  68 y/o gentleman admitted for  surgery s/p L BKA site that is not healing. Patient opted for L AKA vs revision due to risks of non-healing/infection and his need for aortic valve replacement surgery.  Able to visit with patient at bedside. Awake,  Pleasant and social. States having some residual pain to affected leg however thinks that it could be due to an "old hamstring injury" that never fully resolved from years ago. Nurse administered pain medication during this consult. Patient denies any current needs or barriers. States he lives at home with  his wife who is supportive, however she recently completed her cancer treatments for melanoma and also has severe arthritis, so unable to help him much. Plan currently is to return to IP rehab once stable to continue therapy and strength training. Once rehab completed, will plan to return home. Denies any issues managing his diabetes at home and reports that is blood sugars are usually well controlled until he got this infection.   Pertinent Medical History to include DM, CAD, A-Fib,Heart Failure, HTN, MI, PAD, CKD-Stage 3 and aortic valve disease w/valve replacement.  Labs Today: WBC 6.6 down from 13.3 on 08/13/19                       RBC  3.30                       Hemoglobin  8.7                       HCT  28.3                       Glucose  362                        BUN  44                        Creatinine  1.71                        Albumin  2.5    08/06/19                        Hemoglobin A1C  8.7   05/13/19  Contact information left with patient and encouraged him to call should barriers or questions arise. Understanding verbalized. No other needs at this time. Thank you for this consult. Cletis Media RN BSN CWS [place] [phone]

## 2019-08-14 NOTE — Evaluation (Signed)
Occupational Therapy Evaluation Patient Details Name: Joseph Hoffman MRN: [id number] DOB: 07-Dec-1950 Today's Date: 08/14/2019    History of Present Illness 68 y.o. male with medical history significant of hypertension, diabetes mellitus type 2, asthma, systolic congestive heart failure with EF of 30 to 35%, coronary artery disease, atrial fibrillation on warfarin, aortic stenosis status post AVR with mechanical valve on Coumadin needing replacement, obstructive sleep apnea on CPAP, chronic kidney disease stage III, depression with anxiety. Presents to hospital from vascular office for follow up of L great toe amputation 07/03/19. Admitted for treatment of infection of surgical site. S/p L BKA 07/31/19. Went to CIR and back to acute for AKA 08/13/19.   Clinical Impression   This 68 yo male readmitted to acute care from CIR and underwent above presents to acute OT with increased pain, decreased mobility, decreased balance, decreased alertness today when up to recliner all affecting his safety and independence with basic ADLs. He will continue to benefit from acute OT with follow up at on CIR to get to Mod I/ WC level to go home.    Follow Up Recommendations  CIR;Supervision/Assistance - 24 hour    Equipment Recommendations  3 in 1 bedside commode       Precautions / Restrictions Precautions Precautions: Fall Precaution Comments: monitor HR, sats and BP Restrictions Weight Bearing Restrictions: Yes LLE Weight Bearing: Non weight bearing Other Position/Activity Restrictions: L AKA - in ace wrap.  no drainage seen through wrap      Mobility Bed Mobility Overal bed mobility: Needs Assistance Bed Mobility: Supine to Sit     Supine to sit: Mod assist     General bed mobility comments: Pt given cues to roll and push up to sitting - using rail and HOB elevated.  pt reminded to use his right leg to assist - when he did this - it was much easier for him.  pt able to scoot forward and get his  feet on the floor with min assist using chuck.  Transfers Overall transfer level: Needs assistance Equipment used: None Transfers: Lateral/Scoot Transfers          Lateral/Scoot Transfers: Min assist;+2 physical assistance General transfer comment: Pt transfering from higher bed to lower chair to the right side - scoot transfer with min assist of 2.  pt did well with optimal set up.  Pt became less responsive in chair and ended up needing +4 total assist to scoot back into the bed and lay down.  Nursing present.    Balance Overall balance assessment: Needs assistance Sitting-balance support: (foot supported) Sitting balance-Leahy Scale: Good Sitting balance - Comments: pt able to sit EOB with RLe on the floor.  Occasional dizziness                                   ADL either performed or assessed with clinical judgement   ADL Overall ADL's : Needs assistance/impaired Eating/Feeding: Set up;Sitting Eating/Feeding Details (indicate cue type and reason): EOB Grooming: Set up;Supervision/safety;Sitting Grooming Details (indicate cue type and reason): EOB Upper Body Bathing: Supervision/ safety;Set up;Sitting Upper Body Bathing Details (indicate cue type and reason): EOB Lower Body Bathing: Maximal assistance;Bed level   Upper Body Dressing : Supervision/safety;Set up;Sitting Upper Body Dressing Details (indicate cue type and reason): EOB Lower Body Dressing: Total assistance;Bed level   Toilet Transfer: Minimal assistance;+2 for physical assistance Toilet Transfer Details (indicate cue type and reason): lateral  scoot to right                              Pertinent Vitals/Pain Pain Assessment: 0-10 Pain Score: 8  Pain Location: L leg - esp hamstring Pain Descriptors / Indicators: Aching;Grimacing;Pressure Pain Intervention(s): Monitored during session;Repositioned;Limited activity within patient's tolerance;Premedicated before session     Hand  Dominance Right   Extremity/Trunk Assessment Upper Extremity Assessment Upper Extremity Assessment: Overall WFL for tasks assessed           Communication Communication Communication: No difficulties   Cognition Arousal/Alertness: Awake/alert Behavior During Therapy: WFL for tasks assessed/performed Overall Cognitive Status: Within Functional Limits for tasks assessed                                 General Comments: Very motivated and friendly during session; initally hesitant to work with therapy d/t pain but participated well.  Once in the chiar - pt became less responsive - delayed and inconsistent responses, not focused visually.  Pts HR throughout treatment 54-74 and irregular.  pts BP 124/76 in bed, 106/60 in the chair and after 10 minutes down to 98/54.  pt total assisted back to bed.  Nursing present and rapid response called.  Pts blood sugar 406.  Left pt with 4 staff members working with him              Home Living Family/patient expects to be discharged to:: Private residence Living Arrangements: Spouse/significant other;Children Available Help at Discharge: Available 24 hours/day;Family Type of Home: House Home Access: Stairs to enter [employer] of Steps: 1 small step and a threshold to get into house, pt has been negotiating backwards w/ RW since L toe amputation   Home Layout: Two level;Able to live on main level with bedroom/bathroom Alternate Level Stairs-Number of Steps: flight   Bathroom Shower/Tub: Walk-in shower                Lives With: Spouse    Prior Functioning/Environment Level of Independence: Independent        Comments: pt used to be a Academic librarian, independent with all ADL and no AD        OT Problem List: Decreased activity tolerance;Decreased safety awareness;Decreased knowledge of precautions;Pain;Decreased knowledge of use of DME or AE      OT Treatment/Interventions: Self-care/ADL training;DME  and/or AE instruction;Patient/family education;Balance training    OT Goals(Current goals can be found in the care plan section) Acute Rehab OT Goals Patient Stated Goal: to be able to go back to rehab and then home OT Goal Formulation: With patient Time For Goal Achievement: 08/28/19 Potential to Achieve Goals: Good  OT Frequency: Min 2X/week           Co-evaluation PT/OT/SLP Co-Evaluation/Treatment: Yes Reason for Co-Treatment: Complexity of the patient's impairments (multi-system involvement);Necessary to address cognition/behavior during functional activity;For patient/therapist safety PT goals addressed during session: Mobility/safety with mobility;Balance;Strengthening/ROM OT goals addressed during session: Strengthening/ROM      AM-PAC OT "6 Clicks" Daily Activity     Outcome Measure Help from another person eating meals?: None Help from another person taking care of personal grooming?: A Little Help from another person toileting, which includes using toliet, bedpan, or urinal?: A Lot Help from another person bathing (including washing, rinsing, drying)?: A Lot Help from another person to put on and taking off regular upper body clothing?:  A Lot Help from another person to put on and taking off regular lower body clothing?: Total 6 Click Score: 14   End of Session Nurse Communication: (pt with decreased interaction/responsiveness when up to recliner)  Activity Tolerance: (limited by lack of responsiveness when up to recliner despite elevation of legs) Patient left: in bed(with RN an rapid response team assessing pt)  OT Visit Diagnosis: Other abnormalities of gait and mobility (R26.89);Muscle weakness (generalized) (M62.81);Pain;Other (comment) Pain - Right/Left: Left Pain - part of body: Leg                Time: 1002-1100 OT Time Calculation (min): 58 min Charges:  OT General Charges $OT Visit: 1 Visit OT Evaluation $OT Eval Moderate Complexity: 1 Mod OT  Treatments $Self Care/Home Management : 8-22 mins  [place], OTR/L Acute [employer] Pager [phone] Office [phone]     Almon Register 08/14/2019, 4:48 PM

## 2019-08-14 NOTE — Progress Notes (Addendum)
Inpatient Diabetes Program Recommendations  AACE/ADA: New Consensus Statement on Inpatient Glycemic Control (2015)  Target Ranges:  Prepandial:   less than 140 mg/dL      Peak postprandial:   less than 180 mg/dL (1-2 hours)      Critically ill patients:  140 - 180 mg/dL   Lab Results  Component Value Date   GLUCAP 362 (H) 08/14/2019   HGBA1C 8.7 (H) 05/13/2019    Review of Glycemic Control  Diabetes history: DM 2 Outpatient Diabetes medications: Lantus 15-40 units qam, 30-40 units qpm, Victoza 1.8 mg Daily  Current orders for Inpatient glycemic control:  Novolog 0-15 units tid Victoza 1.8 mg Daily  Inpatient Diabetes Program Recommendations:    Glucose 300's this am. Lantus not reordered after surgery. Pt received decadron 10 mg yesterday.  Please reorder Lantus 33 units bid starting this am.  Spoke with Dr. Carlis Abbott. Lantus reordered per verbal order to cosign.  Thanks,  Tama Headings RN, MSN, BC-ADM Inpatient Diabetes Coordinator Team Pager [phone] (8a-5p)

## 2019-08-14 NOTE — Progress Notes (Addendum)
  Progress Note    08/14/2019 7:33 AM 1 Day Post-Op  Subjective:  Minimal pain overnight   Vitals:   08/14/19 0400 08/14/19 0543  BP: 125/69   Pulse: 72 73  Resp: 17 18  Temp: 97.7 F (36.5 C)   SpO2: 100% 100%    Physical Exam: Incisions:  Dressing left in place L AKA   CBC    Component Value Date/Time   WBC 13.3 (H) 08/13/2019 1042   RBC 3.54 (L) 08/13/2019 1042   HGB 9.4 (L) 08/13/2019 1042   HGB 13.8 04/03/2018 0809   HCT 29.8 (L) 08/13/2019 1042   HCT 42.4 04/03/2018 0809   PLT 270 08/13/2019 1042   PLT 168 04/03/2018 0809   MCV 84.2 08/13/2019 1042   MCV 84 04/03/2018 0809   MCH 26.6 08/13/2019 1042   MCHC 31.5 08/13/2019 1042   RDW 19.5 (H) 08/13/2019 1042   RDW 15.5 (H) 04/03/2018 0809   LYMPHSABS 0.9 08/12/2019 1309   MONOABS 1.3 (H) 08/12/2019 1309   EOSABS 0.4 08/12/2019 1309   BASOSABS 0.1 08/12/2019 1309    BMET    Component Value Date/Time   NA 135 08/11/2019 0421   NA 139 03/13/2019 1511   K 4.2 08/11/2019 0421   CL 98 08/11/2019 0421   CO2 27 08/11/2019 0421   GLUCOSE 186 (H) 08/11/2019 0421   BUN 41 (H) 08/11/2019 0421   BUN 42 (H) 03/13/2019 1511   CREATININE 1.47 (H) 08/11/2019 0421   CREATININE 1.06 08/23/2014 0853   CALCIUM 9.2 08/11/2019 0421   GFRNONAA 48 (L) 08/11/2019 0421   GFRAA 55 (L) 08/11/2019 0421    INR    Component Value Date/Time   INR 1.4 (H) 08/13/2019 1042     Intake/Output Summary (Last 24 hours) at 08/14/2019 0733 Last data filed at 08/14/2019 [id number] Gross per 24 hour  Intake 1684.69 ml  Output 3571 ml  Net -1886.31 ml     Assessment/Plan:  68 y.o. Hoffman is s/p left above knee amputation  1 Day Post-Op  - dressing down tomorrow - pain meds on schedule today - IV heparin, restart coumadin likely tomorrow - hopefully back to rehab tomorrow if incision looks ok   Dagoberto Ligas, PA-C [facility] [phone] 08/14/2019 7:33 AM  I have seen and evaluated the patient. I agree  with the PA note as documented above. POD#1 s/p L AKA for non-healing BKA.  Heparin gtt restarted without bolus for mechanical aortic valve.  Will take down dressing tomorrow.  Pain well controlled.   Marty Heck, MD [facility] [place] Office: [phone] Pager: [phone]

## 2019-08-14 NOTE — Progress Notes (Addendum)
ANTICOAGULATION CONSULT NOTE - Follow Up Consult  Pharmacy Consult for Heparin (while INR<2.5) Indication: Mech AVR  Allergies  Allergen Reactions  . Iohexol Anaphylaxis  . Niacin And Related     Flushing with immediate realese  . Penicillins Other (See Comments)    Unknown.[name]  Did it involve swelling of the face/tongue/throat, SOB, or low BP? Unknown Did it involve sudden or severe rash/hives, skin peeling, or any reaction on the inside of your mouth or nose? Unknown Did you need to seek medical attention at a hospital or doctor's office? Unknown When did it last happen?Childhood If all above answers are "NO", may proceed with cephalosporin use.    Patient Measurements: Height: 5\' 11"  (180.3 cm) Weight: 227 lb 15.3 oz (103.4 kg)(post AKA) IBW/kg (Calculated) : 75.3 Heparin dosing wt: 100.5 kg  Vital Signs: Temp: 97.7 F (36.5 C) (09/11 0400) Temp Source: Oral (09/11 0400) BP: 116/56 (09/11 0836) Pulse Rate: 73 (09/11 0833)  Labs: Recent Labs    08/12/19 0445  08/12/19 1309 08/12/19 2350 08/13/19 1042 08/14/19 0701  HGB  --    < > 8.6*  --  9.4* 8.7*  HCT  --   --  27.6*  --  29.8* 28.3*  PLT  --   --  270  --  270 276  LABPROT 41.1*  --   --  20.5* 16.9*  --   INR 4.4*  --   --  1.8* 1.4*  --   HEPARINUNFRC  --   --   --   --  0.12* 0.22*  CREATININE  --   --   --   --   --  1.71*   < > = values in this interval not displayed.    Estimated Creatinine Clearance: 49.2 mL/min (A) (by C-G formula based on SCr of 1.71 mg/dL (H)).   Assessment: 60 YOM who has been continued warfarin for mechanical AVR. Warfarin was held on 9/9 and the INR reversed with Vit K for plans for AKA on 9/10 - now completed. Pharmacy has been consulted to resume Heparin without a bolus on 9/11 AM.   Initial heparin level after re-start subtherapeutic at 0.22  Goal of Therapy:  Heparin level 0.3-0.7 units/mL INR 2.5-3.5 Monitor platelets by anticoagulation  protocol: Yes   Plan:  Increase heparin gtt to 1900 units/hr F/u 6 hour heparin level VVS plan to restart warfarin tomorrow likely   Addendum: Heparin level subtherapeutic at 0.27 s/p rate increase Increase heparin gtt to 2100 units/hr F/u 6 hour heparin level  Bertis Ruddy, PharmD Clinical Pharmacist Please check AMION for all [place] numbers 08/14/2019 3:40 PM

## 2019-08-14 NOTE — Significant Event (Signed)
Rapid Response Event Note  Overview: Time Called: 1055 Arrival Time: 1057 Event Type: Neurologic  Initial Focused Assessment: PT assisted patient to chair, slide transfer. After up in the chair patient had decreased LOC. Per staff he in normally very talkative.  BP 107/39 in the bed then 98/49 when in the chair.  AF rate 50-60s.  RR 16  O2 sat 97%. S/P BKA (recent toe amputation, infection, AKA, non healing wound) Redo CABG/AVR this past summer  Interventions: Assisted back to lying in the bed. BP 129/51  AF 60s More talkative, back to normal mental status Narcan given Voided > 600cc RN spoke with PA via phone  500cc NS bolus.  BP 124/49  AF 50-70  RR 14-16  O2 sat 98%    Plan of Care (if not transferred): RN to call if assistance needed.  Event Summary: Name of Physician Notified: Matt PA & Dr Carlis Abbott at 1055    at    Outcome: Stayed in room and stabalized     Lilyan Punt, [place]

## 2019-08-14 NOTE — Evaluation (Signed)
Physical Therapy Evaluation Patient Details Name: Joseph Hoffman MRN: [id number] DOB: October 19, 1951 Today's Date: 08/14/2019   History of Present Illness  68 y.o. male with medical history significant of hypertension, diabetes mellitus type 2, asthma, systolic congestive heart failure with EF of 30 to 35%, coronary artery disease, atrial fibrillation on warfarin, aortic stenosis status post AVR with mechanical valve on Coumadin needing replacement, obstructive sleep apnea on CPAP, chronic kidney disease stage III, depression with anxiety. Presents to hospital from vascular office for follow up of L great toe amputation 07/03/19. Admitted for treatment of infection of surgical site. S/p L BKA 07/31/19. Went to CIR and back to acute for AKA 08/13/19.    Clinical Impression  Pt agreeable to OOB.  He did well getting up - +2 min assist scoot to recliner.  Pt was telling stories and then all of a sudden was delayed in speech and responses.  He has occasional word finding difficulties. His BP was slowly dropping to 98/50 - HR staying 60s and irregular.  Nursing to room and +4 total lift him back to bed.  Pt left in bed with nursing and medical staff present.  Pt will be good candidate to return to CIR setting when medically stable.  Will follow him on acute for therapy.    Follow Up Recommendations CIR;Supervision for mobility/OOB    Equipment Recommendations  Other (comment)    Recommendations for Other Services       Precautions / Restrictions Precautions Precautions: Fall Precaution Comments: monitor HR and breathing  Restrictions Weight Bearing Restrictions: Yes LLE Weight Bearing: Non weight bearing Other Position/Activity Restrictions: L AKA - in ace wrap.  no drainage seen through wrap      Mobility  Bed Mobility Overal bed mobility: Needs Assistance Bed Mobility: Supine to Sit     Supine to sit: Mod assist     General bed mobility comments: Pt given cues to roll and push up to  sitting - using rail and HOB elevated.  pt reminded to use his right leg to assist - when he did this - it was much easier for him.  pt able to scoot forward and get his feet on the floor with min assist using chuck.  Transfers Overall transfer level: Needs assistance Equipment used: None Transfers: Lateral/Scoot Transfers          Lateral/Scoot Transfers: Min assist;+2 physical assistance General transfer comment: Pt transfering from higher bed to lower chair to the right side - scoot transfer with min assist of 2.  pt did well with optimal set up.  Pt became less responsive in chair and ended up needing +4 total assist to scoot back into the bed and lay down.  Nursing present.  Ambulation/Gait             General Gait Details: unable - did not try to stand today  Stairs            Wheelchair Mobility    Modified Rankin (Stroke Patients Only)       Balance Overall balance assessment: Needs assistance Sitting-balance support: Feet supported Sitting balance-Leahy Scale: Good Sitting balance - Comments: pt able to sit EOB with feet on the floor.  Occasional dizziness     Standing balance-Leahy Scale: Zero Standing balance comment: not tested today                             Pertinent Vitals/Pain Pain Assessment: 0-10  Pain Score: 8  Pain Location: L leg - esp hamstring Pain Descriptors / Indicators: Aching;Grimacing;Pressure Pain Intervention(s): Monitored during session;Repositioned;Limited activity within patient's tolerance;Premedicated before session    [place] expects to be discharged to:: Private residence Living Arrangements: Spouse/significant other;Children Available Help at Discharge: Available 24 hours/day;Family Type of Home: House Home Access: Stairs to enter   [employer] of Steps: 1 small step and a threshold to get into house, pt has been negotiating backwards w/ RW since L toe amputation Home  Layout: Two level;Able to live on main level with bedroom/bathroom        Prior Function Level of Independence: Independent         Comments: pt used to be a Academic librarian, 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  Pt supine and lifted left leg (instructed not to put hands near end of residual limb) and pt stretched out hamstring and said it helped the pain.  pt knows to avoid all strenous movement and to avoid any pressure at end of residual limb    Cervical / Trunk Assessment Cervical / Trunk Assessment: Normal  Communication   Communication: No difficulties  Cognition Arousal/Alertness: Awake/alert Behavior During Therapy: WFL for tasks assessed/performed Overall Cognitive Status: Within Functional Limits for tasks assessed                                 General Comments: very motivated and friendly during session; initally hesitant to work with therapy d/t pain but participated well.  once in the chiar - pt became less responsive - delayed and inconsistent responses, not focused visually.  Pts HR throughout treatment 54-74 and irregular.  pts BP 124/76 in bed, 106/60 in the chair and after 10 minutes up 98/54.  pt total assisted back to bed.  Nursing present and doctor called.  Pts blood sugar 406.  Left pt with 4 staff members working with him      General Comments General comments (skin integrity, edema, etc.): HR 54-74 and irregular.  Pt BP (124/60 initally) dropping throughout session - last checked 98/50 and total assisted pt back to bed with nursing present.  Pts blood sugar tested and 406    Exercises     Assessment/Plan    PT Assessment Patient needs continued PT services  PT Problem List Decreased strength;Decreased activity tolerance;Decreased balance;Decreased mobility;Decreased  coordination;Pain;Cardiopulmonary status limiting activity;Decreased skin integrity       PT Treatment Interventions DME instruction;Gait training;Functional mobility training;Therapeutic activities;Therapeutic exercise;Balance training;Patient/family education    PT Goals (Current goals can be found in the Care Plan section)  Acute Rehab PT Goals Patient Stated Goal: to get stronger and be independent PT Goal Formulation: With patient Time For Goal Achievement: 08/21/19 Potential to Achieve Goals: Good    Frequency Min 3X/week   Barriers to discharge        Co-evaluation PT/OT/SLP Co-Evaluation/Treatment: Yes Reason for Co-Treatment: Complexity of the patient's impairments (multi-system involvement);For patient/therapist safety;Necessary to address cognition/behavior during functional activity PT goals addressed during session: Mobility/safety with mobility;Balance;Strengthening/ROM OT goals addressed during session: Strengthening/ROM       AM-PAC PT "6 Clicks" Mobility  Outcome Measure Help needed turning from your back to your side while in  a flat bed without using bedrails?: A Lot Help needed moving from lying on your back to sitting on the side of a flat bed without using bedrails?: A Lot Help needed moving to and from a bed to a chair (including a wheelchair)?: A Lot Help needed standing up from a chair using your arms (e.g., wheelchair or bedside chair)?: Total Help needed to walk in hospital room?: Total Help needed climbing 3-5 steps with a railing? : Total 6 Click Score: 9    End of Session   Activity Tolerance: Treatment limited secondary to medical complications (Comment) Patient left: in bed;with nursing/sitter in room;with call bell/phone within reach Nurse Communication: Mobility status;Precautions PT Visit Diagnosis: Unsteadiness on feet (R26.81);Other abnormalities of gait and mobility (R26.89);Muscle weakness (generalized) (M62.81);Difficulty in walking,  not elsewhere classified (R26.2);Pain Pain - Right/Left: Left Pain - part of body: Leg    Time: 1002-1100 PT Time Calculation (min) (ACUTE ONLY): 58 min   Charges:   PT Evaluation $PT Eval Moderate Complexity: 1 Mod PT Treatments $Therapeutic Activity: 23-37 mins        08/14/2019   Rande Lawman, PT   Loyal Buba 08/14/2019, 12:06 PM

## 2019-08-14 NOTE — Progress Notes (Signed)
Spoke with patient concerning pain medication regime. Patient says we are not giving him enough pain medication. Patient was given narcan earlier today after PT worked with him and he developed delayed responses and some confusion. CBG was 406 and insulin given. Patient responded to medication and was aware that he got fuzzy and kind of out of it. Patient given lunch and asked to relax. Patient asked for more pain medication in the afternoon and I explained that we would have to monitor him more closely due to earlier episode. Patient stated that we should be able to give him something to "knock me out". I explained that we would control his pain and monitor his vital signs to make sure he could tolerate. I offered heat pads and per patient request K-pad delivered to patient. Patient states that we are not even giving him the amount of medication he was getting in the other part of the hospital. Pt states that therapist helped him before by massaging limb. Patient aware that MD would look at pain medication and make adjustments as needed. I will give pt two percocets as directed without additional oxycodone IR unless able to tolerate. Pt resting with call bell within reach.  Will continue to monitor.

## 2019-08-14 NOTE — Consult Note (Signed)
   [facility] CM Inpatient Consult   08/14/2019  FAVOR Joseph Hoffman [birth year] [id number]   Follow up: Patient transitioned out of CIR 68 y.o. male is s/p left above knee amputation  1 Day Post-Op and remains in extreme high risk for unplanned readmission score in Medicare ACO. Patient was active prior to admission however has been made not active status due to greater than 10 days and discussed in [facility] IDT conference for progress and goals..   Patient is post op Day 1 and chart review reveals that disposition could be to return to CIR.  Plan: Follow progress and needs as appropriate.  Joseph Brood, RN BSN [facility] Liaison  [phone] business mobile phone Toll free office [phone]  Fax number: [phone] [name]

## 2019-08-14 NOTE — Progress Notes (Signed)
After receiving verbal order for in and out catheterization, returned to pt room to find that he was using urinal and voiding on his own. Pt stated that this was the first time in a long time that he was able to void on his own. Pt has voided 375 ml of urine so far. Will do a second bladder scan to check post void residual. Will continue to monitor. Lajoyce Corners, RN

## 2019-08-14 NOTE — Progress Notes (Signed)
Pt called this RN to room and stated he had to urinate but couldn't do so unless he was cathed. Proceded with a bladder scan that revealed urine residual volume >1356 ml. Paged MD. Received verbal order for in and out catheterization. Also received verbal order for Foley catheter placement should patient bladder volume require a second in and out catheterization. Will place order and continue to monitor. Lajoyce Corners, RN

## 2019-08-14 NOTE — Progress Notes (Signed)
Pt post void residual was 881 ml. In and out cath was performed. Pericare performed before and after procedure. Pt informed of what was being done. Assisted by Rodman Key, RN. Pt voided 1,000 ml. Third bladder scan showed post void residual of 115 ml. Pt returned to a comfortable position. Call bell within reach and bed in lowest position. Will continue to monitor. Lajoyce Corners, RN

## 2019-08-14 NOTE — Progress Notes (Signed)
Inpatient Rehab Admissions:  Inpatient Rehab Consult received.  I met with patient at the bedside for rehabilitation assessment and to discuss goals and expectations of an inpatient rehab admission.  He is interested in returning to CIR. Note pt with rapid response during therapy evaluation today.  Per Dr. Letta Pate, will plan to possibly re-admit to CIR Monday.   Signed: Shann Medal, PT, DPT Admissions Coordinator [phone] 08/14/19  4:13 PM

## 2019-08-15 LAB — CBC
HCT: 28.2 % — ABNORMAL LOW (ref 39.0–52.0)
Hemoglobin: 8.7 g/dL — ABNORMAL LOW (ref 13.0–17.0)
MCH: 26.2 pg (ref 26.0–34.0)
MCHC: 30.9 g/dL (ref 30.0–36.0)
MCV: 84.9 fL (ref 80.0–100.0)
Platelets: 306 10*3/uL (ref 150–400)
RBC: 3.32 MIL/uL — ABNORMAL LOW (ref 4.22–5.81)
RDW: 18.6 % — ABNORMAL HIGH (ref 11.5–15.5)
WBC: 6.7 10*3/uL (ref 4.0–10.5)
nRBC: 0 % (ref 0.0–0.2)

## 2019-08-15 LAB — GLUCOSE, CAPILLARY
Glucose-Capillary: 248 mg/dL — ABNORMAL HIGH (ref 70–99)
Glucose-Capillary: 253 mg/dL — ABNORMAL HIGH (ref 70–99)
Glucose-Capillary: 336 mg/dL — ABNORMAL HIGH (ref 70–99)
Glucose-Capillary: 318 mg/dL — ABNORMAL HIGH (ref 70–99)

## 2019-08-15 LAB — BASIC METABOLIC PANEL
Anion gap: 12 (ref 5–15)
BUN: 45 mg/dL — ABNORMAL HIGH (ref 8–23)
CO2: 23 mmol/L (ref 22–32)
Calcium: 8.9 mg/dL (ref 8.9–10.3)
Chloride: 97 mmol/L — ABNORMAL LOW (ref 98–111)
Creatinine, Ser: 1.5 mg/dL — ABNORMAL HIGH (ref 0.61–1.24)
GFR calc Af Amer: 54 mL/min — ABNORMAL LOW (ref >60)
GFR calc non Af Amer: 46 mL/min — ABNORMAL LOW (ref >60)
Glucose, Bld: 279 mg/dL — ABNORMAL HIGH (ref 70–99)
Potassium: 4.3 mmol/L (ref 3.5–5.1)
Sodium: 132 mmol/L — ABNORMAL LOW (ref 135–145)

## 2019-08-15 LAB — HEPARIN LEVEL (UNFRACTIONATED)
Heparin Unfractionated: 0.98 IU/mL — ABNORMAL HIGH (ref 0.30–0.70)
Heparin Unfractionated: 1 IU/mL — ABNORMAL HIGH (ref 0.30–0.70)

## 2019-08-15 LAB — PROTIME-INR
INR: 1.3 — ABNORMAL HIGH (ref 0.8–1.2)
Prothrombin Time: 15.7 seconds — ABNORMAL HIGH (ref 11.4–15.2)

## 2019-08-15 MED ORDER — HEPARIN (PORCINE) 25000 UT/250ML-% IV SOLN
1500.0000 [IU]/h | INTRAVENOUS | Status: DC
  Administered 2019-08-15: 17:00:00 1600 [IU]/h via INTRAVENOUS
  Administered 2019-08-16 – 2019-08-17 (×2): 1500 [IU]/h via INTRAVENOUS
  Filled 2019-08-15 (×3): qty 250

## 2019-08-15 MED ORDER — WARFARIN - PHARMACIST DOSING INPATIENT
Freq: Every day | Status: DC
  Administered 2019-08-17: 18:00:00

## 2019-08-15 MED ORDER — WARFARIN SODIUM 2.5 MG PO TABS
12.5000 mg | ORAL_TABLET | Freq: Once | ORAL | Status: AC
  Administered 2019-08-15: 12.5 mg via ORAL
  Filled 2019-08-15: qty 1

## 2019-08-15 NOTE — Progress Notes (Signed)
Asked patient if he needed help with his CPAP tonight.  Patient stated that he did not. Patient self manages the machine.

## 2019-08-15 NOTE — Progress Notes (Signed)
ANTICOAGULATION CONSULT NOTE   Pharmacy Consult Heparin  Indication: Mech AVR  Allergies  Allergen Reactions  . Iohexol Anaphylaxis  . Niacin And Related     Flushing with immediate realese  . Penicillins Other (See Comments)    Unknown.[name]  Did it involve swelling of the face/tongue/throat, SOB, or low BP? Unknown Did it involve sudden or severe rash/hives, skin peeling, or any reaction on the inside of your mouth or nose? Unknown Did you need to seek medical attention at a hospital or doctor's office? Unknown When did it last happen?Childhood If all above answers are "NO", may proceed with cephalosporin use.    Patient Measurements: Height: 5\' 11"  (180.3 cm) Weight: 225 lb 15.5 oz (102.5 kg) IBW/kg (Calculated) : 75.3   Vital Signs: Temp: 98.2 F (36.8 C) (09/12 0410) Temp Source: Oral (09/12 0410) BP: 120/42 (09/12 0410) Pulse Rate: 68 (09/11 2200)  Labs: Recent Labs    08/12/19 2350  08/13/19 1042 08/14/19 0701 08/14/19 1454 08/14/19 2130 08/15/19 0256  HGB  --   --  9.4* 8.7*  --   --  8.7*  HCT  --   --  29.8* 28.3*  --   --  28.2*  PLT  --   --  270 276  --   --  306  LABPROT 20.5*  --  16.9*  --   --   --   --   INR 1.8*  --  1.4*  --   --   --   --   HEPARINUNFRC  --    < > 0.12* 0.22* 0.27* 0.63 0.98*  CREATININE  --   --   --  1.71*  --   --  1.50*   < > = values in this interval not displayed.    Estimated Creatinine Clearance: 55.9 mL/min (A) (by C-G formula based on SCr of 1.5 mg/dL (H)).   Assessment: 68 y.o. male with AVR for heparin    Goal of Therapy:  Heparin level 0.3-0.7 units/mL INR 2.5-3.5 Monitor platelets by anticoagulation protocol: Yes   Plan:  Decrease Heparin 1800 units/hr Check heparin level in 8 hours.  Phillis Knack, PharmD, BCPS

## 2019-08-15 NOTE — Progress Notes (Signed)
ANTICOAGULATION CONSULT NOTE - Follow Up Consult  Pharmacy Consult Heparin while INR<2.5 and to Restart warfarin Indication: Mech AVR  Allergies  Allergen Reactions  . Iohexol Anaphylaxis  . Niacin And Related     Flushing with immediate realese  . Penicillins Other (See Comments)    Unknown.[name]  Did it involve swelling of the face/tongue/throat, SOB, or low BP? Unknown Did it involve sudden or severe rash/hives, skin peeling, or any reaction on the inside of your mouth or nose? Unknown Did you need to seek medical attention at a hospital or doctor's office? Unknown When did it last happen?Childhood If all above answers are "NO", may proceed with cephalosporin use.    Patient Measurements: Height: 5\' 11"  (180.3 cm) Weight: 225 lb 15.5 oz (102.5 kg) IBW/kg (Calculated) : 75.3   Vital Signs: Temp: 97.7 F (36.5 C) (09/12 0841) Temp Source: Oral (09/12 0841) BP: 125/53 (09/12 1135) Pulse Rate: 69 (09/12 1135)  Labs: Recent Labs    08/12/19 2350  08/13/19 1042 08/14/19 0701  08/14/19 2130 08/15/19 0256 08/15/19 1142 08/15/19 1502  HGB  --    < > 9.4* 8.7*  --   --  8.7*  --   --   HCT  --   --  29.8* 28.3*  --   --  28.2*  --   --   PLT  --   --  270 276  --   --  306  --   --   LABPROT 20.5*  --  16.9*  --   --   --   --  15.7*  --   INR 1.8*  --  1.4*  --   --   --   --  1.3*  --   HEPARINUNFRC  --    < > 0.12* 0.22*   < > 0.63 0.98*  --  1.00*  CREATININE  --   --   --  1.71*  --   --  1.50*  --   --    < > = values in this interval not displayed.    Estimated Creatinine Clearance: 55.9 mL/min (A) (by C-G formula based on SCr of 1.5 mg/dL (H)).   Assessment: 60 YOM who has been continued on warfarin for mechanical AVR however due to AKA on 9/10 AM, warfarin was revered. Now that pt is finished with AKA, Pharmacy has been consulted to resume warfarin dosing and bridge with heparin until INR>2.5 -heparin level= 1.0   PTA warfarin  dose: 10 mg daily except 12.5mg  TTS, admit INR 2.7 (goal 2.5-3.5)   Goal of Therapy:  Heparin level 0.3-0.7 units/mL INR 2.5-3.5 Monitor platelets by anticoagulation protocol: Yes   Plan:  -hold heparin for 30 minutes then decrease to 1600 units/hr -Heparin level in 6 hours and daily wth CBC daily   Hildred Laser, PharmD Clinical Pharmacist **Pharmacist phone directory can now be found on [email or web address] ([email or web address]).  Listed under [place].

## 2019-08-15 NOTE — Progress Notes (Signed)
ANTICOAGULATION CONSULT NOTE - Follow Up Consult  Pharmacy Consult Heparin while INR<2.5 Indication: Mech AVR  Allergies  Allergen Reactions  . Iohexol Anaphylaxis  . Niacin And Related     Flushing with immediate realese  . Penicillins Other (See Comments)    Unknown.[name]  Did it involve swelling of the face/tongue/throat, SOB, or low BP? Unknown Did it involve sudden or severe rash/hives, skin peeling, or any reaction on the inside of your mouth or nose? Unknown Did you need to seek medical attention at a hospital or doctor's office? Unknown When did it last happen?Childhood If all above answers are "NO", may proceed with cephalosporin use.    Patient Measurements: Height: 5\' 11"  (180.3 cm) Weight: 227 lb 15.3 oz (103.4 kg)(post AKA) IBW/kg (Calculated) : 75.3   Vital Signs: Temp: 98.4 F (36.9 C) (09/11 2000) Temp Source: Oral (09/11 2000) BP: 109/83 (09/11 2000) Pulse Rate: 68 (09/11 2200)  Labs: Recent Labs    08/12/19 0445  08/12/19 1309 08/12/19 2350  08/13/19 1042 08/14/19 0701 08/14/19 1454 08/14/19 2130  HGB  --    < > 8.6*  --   --  9.4* 8.7*  --   --   HCT  --   --  27.6*  --   --  29.8* 28.3*  --   --   PLT  --   --  270  --   --  270 276  --   --   LABPROT 41.1*  --   --  20.5*  --  16.9*  --   --   --   INR 4.4*  --   --  1.8*  --  1.4*  --   --   --   HEPARINUNFRC  --   --   --   --    < > 0.12* 0.22* 0.27* 0.63  CREATININE  --   --   --   --   --   --  1.71*  --   --    < > = values in this interval not displayed.    Estimated Creatinine Clearance: 49.2 mL/min (A) (by C-G formula based on SCr of 1.71 mg/dL (H)).   Assessment: 48 YOM who has been continued warfarin for mechanical AVR however now with plans for potential AKA on 9/10 AM if INR reduced enough. Pharmacy has been consulted to aid with INR reversal and Heparin bridge when INR<2.5  PTA warfarin dose:  10 mg daily except 12.5mg  TTS, admit INR 2.7 (goal  2.5-3.5)  INR earlier today was SUPRAtherapeutic at 4.4, Vit K 10 mg IV x 1 dose has been ordered by the rehab PA Reesa Chew) however this high dose of Vit K can lead to residual warfarin resistance for >/= 1 week. Given the patient's mechanical valve - will reduce this initial dose to 5 mg IV x 1, will plan to recheck an INR in 6 hours and repeat the dose if it remains >2.5. Once the INR is down to <2.5 - will plan to start a Heparin bridge.     9/12 AM update:  Heparin level therapeutic x 1 after rate increase Now 2 days post-op  Goal of Therapy:  Heparin level 0.3-0.7 units/mL INR 2.5-3.5 Monitor platelets by anticoagulation protocol: Yes   Plan:  -Cont heparin at 2100 units/hr -Confirmatory heparin level with AM labs  Narda Bonds, PharmD, [place] Pharmacist Phone: [phone]

## 2019-08-15 NOTE — Progress Notes (Signed)
  Progress Note    08/15/2019 10:58 AM 2 Days Post-Op  Subjective: No overnight issues having some pain in left AKA site  Vitals:   08/15/19 0817 08/15/19 0841  BP:  131/68  Pulse:  89  Resp:    Temp:  97.7 F (36.5 C)  SpO2: 96% 96%    Physical Exam: Awake alert oriented Nonlabored respirations Left AKA site healing well with staples clean dry intact  CBC    Component Value Date/Time   WBC 6.7 08/15/2019 0256   RBC 3.32 (L) 08/15/2019 0256   HGB 8.7 (L) 08/15/2019 0256   HGB 13.8 04/03/2018 0809   HCT 28.2 (L) 08/15/2019 0256   HCT 42.4 04/03/2018 0809   PLT 306 08/15/2019 0256   PLT 168 04/03/2018 0809   MCV 84.9 08/15/2019 0256   MCV 84 04/03/2018 0809   MCH 26.2 08/15/2019 0256   MCHC 30.9 08/15/2019 0256   RDW 18.6 (H) 08/15/2019 0256   RDW 15.5 (H) 04/03/2018 0809   LYMPHSABS 0.9 08/12/2019 1309   MONOABS 1.3 (H) 08/12/2019 1309   EOSABS 0.4 08/12/2019 1309   BASOSABS 0.1 08/12/2019 1309    BMET    Component Value Date/Time   NA 132 (L) 08/15/2019 0256   NA 139 03/13/2019 1511   K 4.3 08/15/2019 0256   CL 97 (L) 08/15/2019 0256   CO2 23 08/15/2019 0256   GLUCOSE 279 (H) 08/15/2019 0256   BUN 45 (H) 08/15/2019 0256   BUN 42 (H) 03/13/2019 1511   CREATININE 1.50 (H) 08/15/2019 0256   CREATININE 1.06 08/23/2014 0853   CALCIUM 8.9 08/15/2019 0256   GFRNONAA 46 (L) 08/15/2019 0256   GFRAA 54 (L) 08/15/2019 0256    INR    Component Value Date/Time   INR 1.4 (H) 08/13/2019 1042     Intake/Output Summary (Last 24 hours) at 08/15/2019 1058 Last data filed at 08/15/2019 0849 Gross per 24 hour  Intake 1392.23 ml  Output 3955 ml  Net -2562.77 ml     Assessment:  68 y.o. male is s/p conversion of left BKA to AKA.  Dressing came down today  Plan: On heparin drip we will begin warfarin for mechanical valve Hep-Lock IV Dispel will be to rehab likely Monday.   Lashawndra Lampkins C. Donzetta Matters, MD [facility] [place] Office:  [phone] Pager: [phone]  08/15/2019 10:58 AM

## 2019-08-15 NOTE — Progress Notes (Signed)
ANTICOAGULATION CONSULT NOTE - Follow Up Consult  Pharmacy Consult Heparin while INR<2.5 and to Restart warfarin Indication: Mech AVR  Allergies  Allergen Reactions  . Iohexol Anaphylaxis  . Niacin And Related     Flushing with immediate realese  . Penicillins Other (See Comments)    Unknown.[name]  Did it involve swelling of the face/tongue/throat, SOB, or low BP? Unknown Did it involve sudden or severe rash/hives, skin peeling, or any reaction on the inside of your mouth or nose? Unknown Did you need to seek medical attention at a hospital or doctor's office? Unknown When did it last happen?Childhood If all above answers are "NO", may proceed with cephalosporin use.    Patient Measurements: Height: 5\' 11"  (180.3 cm) Weight: 225 lb 15.5 oz (102.5 kg) IBW/kg (Calculated) : 75.3   Vital Signs: Temp: 97.7 F (36.5 C) (09/12 0841) Temp Source: Oral (09/12 0841) BP: 131/68 (09/12 0841) Pulse Rate: 89 (09/12 0841)  Labs: Recent Labs    08/12/19 2350  08/13/19 1042 08/14/19 0701 08/14/19 1454 08/14/19 2130 08/15/19 0256  HGB  --   --  9.4* 8.7*  --   --  8.7*  HCT  --   --  29.8* 28.3*  --   --  28.2*  PLT  --   --  270 276  --   --  306  LABPROT 20.5*  --  16.9*  --   --   --   --   INR 1.8*  --  1.4*  --   --   --   --   HEPARINUNFRC  --    < > 0.12* 0.22* 0.27* 0.63 0.98*  CREATININE  --   --   --  1.71*  --   --  1.50*   < > = values in this interval not displayed.    Estimated Creatinine Clearance: 55.9 mL/min (A) (by C-G formula based on SCr of 1.5 mg/dL (H)).   Assessment: 58 YOM who has been continued on warfarin for mechanical AVR however due to AKA on 9/10 AM, warfarin was revered. Now that pt is finished with AKA, Pharmacy has been consulted to resume warfarin dosing and bridge with heparin until INR>2.5  PTA warfarin dose: 10 mg daily except 12.5mg  TTS, admit INR 2.7 (goal 2.5-3.5)  Pt is post-op day 3. INR today is 1.3  (subtherapeutic) after not receiving any warfarin for the last 3 days. Heparin level still pending. Patient's Hg and Hct are low but stable. Plts are wnls. No issues with bleeding or heparin infusion per RN.    Goal of Therapy:  Heparin level 0.3-0.7 units/mL INR 2.5-3.5 Monitor platelets by anticoagulation protocol: Yes   Plan:  -Give warfarin 12.5 mg x 1 tonight (restarting home dose). -Cont heparin at 1800 units/hr -Awaiting heparin level from 1400 to made dose adjustment (if needed) on heparin. - Monitor CBC, INR, and heparin levels daily - Watch for signs/symptoms of bleeding  Sherren Kerns, PharmD PGY1 Acute Care Pharmacy Resident [phone]

## 2019-08-16 LAB — CBC
HCT: 27.8 % — ABNORMAL LOW (ref 39.0–52.0)
Hemoglobin: 8.8 g/dL — ABNORMAL LOW (ref 13.0–17.0)
MCH: 26.2 pg (ref 26.0–34.0)
MCHC: 31.7 g/dL (ref 30.0–36.0)
MCV: 82.7 fL (ref 80.0–100.0)
Platelets: 316 10*3/uL (ref 150–400)
RBC: 3.36 MIL/uL — ABNORMAL LOW (ref 4.22–5.81)
RDW: 18.3 % — ABNORMAL HIGH (ref 11.5–15.5)
WBC: 9.4 10*3/uL (ref 4.0–10.5)
nRBC: 0 % (ref 0.0–0.2)

## 2019-08-16 LAB — HEPARIN LEVEL (UNFRACTIONATED)
Heparin Unfractionated: 0.61 IU/mL (ref 0.30–0.70)
Heparin Unfractionated: 0.63 IU/mL (ref 0.30–0.70)
Heparin Unfractionated: 0.69 IU/mL (ref 0.30–0.70)

## 2019-08-16 LAB — GLUCOSE, CAPILLARY
Glucose-Capillary: 271 mg/dL — ABNORMAL HIGH (ref 70–99)
Glucose-Capillary: 263 mg/dL — ABNORMAL HIGH (ref 70–99)
Glucose-Capillary: 263 mg/dL — ABNORMAL HIGH (ref 70–99)
Glucose-Capillary: 263 mg/dL — ABNORMAL HIGH (ref 70–99)

## 2019-08-16 LAB — PROTIME-INR
INR: 1.2 (ref 0.8–1.2)
Prothrombin Time: 15.2 seconds (ref 11.4–15.2)

## 2019-08-16 MED ORDER — INSULIN ASPART 100 UNIT/ML ~~LOC~~ SOLN
0.0000 [IU] | Freq: Three times a day (TID) | SUBCUTANEOUS | Status: DC
  Administered 2019-08-16 – 2019-08-17 (×3): 11 [IU] via SUBCUTANEOUS
  Administered 2019-08-17: 7 [IU] via SUBCUTANEOUS
  Administered 2019-08-17: 11 [IU] via SUBCUTANEOUS

## 2019-08-16 MED ORDER — INSULIN GLARGINE 100 UNIT/ML ~~LOC~~ SOLN
40.0000 [IU] | Freq: Two times a day (BID) | SUBCUTANEOUS | Status: DC
  Administered 2019-08-16 – 2019-08-17 (×3): 40 [IU] via SUBCUTANEOUS
  Filled 2019-08-16 (×4): qty 0.4

## 2019-08-16 MED ORDER — WARFARIN SODIUM 10 MG PO TABS
10.0000 mg | ORAL_TABLET | Freq: Once | ORAL | Status: AC
  Administered 2019-08-16: 10 mg via ORAL
  Filled 2019-08-16: qty 1

## 2019-08-16 NOTE — Progress Notes (Signed)
ANTICOAGULATION CONSULT NOTE - Follow Up Consult  Pharmacy Consult Heparin while INR<2.5 and to Restart warfarin Indication: Mech AVR  Allergies  Allergen Reactions  . Iohexol Anaphylaxis  . Niacin And Related     Flushing with immediate realese  . Penicillins Other (See Comments)    Unknown.[name]  Did it involve swelling of the face/tongue/throat, SOB, or low BP? Unknown Did it involve sudden or severe rash/hives, skin peeling, or any reaction on the inside of your mouth or nose? Unknown Did you need to seek medical attention at a hospital or doctor's office? Unknown When did it last happen?Childhood If all above answers are "NO", may proceed with cephalosporin use.    Patient Measurements: Height: 5\' 11"  (180.3 cm) Weight: 225 lb 15.5 oz (102.5 kg) IBW/kg (Calculated) : 75.3   Vital Signs: Temp: 98 F (36.7 C) (09/13 0503) Temp Source: Axillary (09/13 0503) BP: 122/51 (09/13 0503) Pulse Rate: 89 (09/13 0503)  Labs: Recent Labs    08/13/19 1042 08/14/19 0701  08/15/19 0256 08/15/19 1142 08/15/19 1502 08/15/19 2331 08/16/19 0231  HGB 9.4* 8.7*  --  8.7*  --   --   --  8.8*  HCT 29.8* 28.3*  --  28.2*  --   --   --  27.8*  PLT 270 276  --  306  --   --   --  316  LABPROT 16.9*  --   --   --  15.7*  --   --  15.2  INR 1.4*  --   --   --  1.3*  --   --  1.2  HEPARINUNFRC 0.12* 0.22*   < > 0.98*  --  1.00* 0.61 0.69  CREATININE  --  1.71*  --  1.50*  --   --   --   --    < > = values in this interval not displayed.    Estimated Creatinine Clearance: 55.9 mL/min (A) (by C-G formula based on SCr of 1.5 mg/dL (H)).   Assessment: 58 YOM who has been continued on warfarin for mechanical AVR however due to AKA on 9/10 AM, warfarin was revered. Now that pt is finished with AKA, Pharmacy has been consulted to resume warfarin dosing and bridge with heparin until INR>2.5  PTA warfarin dose: 10 mg daily except 12.5mg  TTS, admit INR 2.7 (goal  2.5-3.5)  Pt is post-op day 4. INR today is 1.2 (subtherapeutic) after restarting warfarin yesterday evening. AM labs were drawn only a few hours after receiving warfarin dose, thus INR not representative of yesterday's dose. It may take a week or more to reach steady state for warfarin. Heparin level this morning is on the high end of the therapeutic range at 0.69 on heparin rate of 1600 units/hr. Patient's Hg and Hct are low but stable. Plts are wnls. No issues with bleeding or heparin infusion per RN.   Goal of Therapy:  Heparin level 0.3-0.7 units/mL INR 2.5-3.5 Monitor platelets by anticoagulation protocol: Yes   Plan:  - Give warfarin 10 mg x 1 tonight (restarting home dose). - Decrease heparin rate to 1500 units/hr - Recheck heparin level in 6 hours. - Monitor CBC, INR, and heparin levels daily - Watch for signs/symptoms of bleeding  Sherren Kerns, PharmD PGY1 Acute Care Pharmacy Resident [phone]

## 2019-08-16 NOTE — Progress Notes (Signed)
ANTICOAGULATION CONSULT NOTE   Pharmacy Consult Heparin  Indication: Mech AVR  Allergies  Allergen Reactions  . Iohexol Anaphylaxis  . Niacin And Related     Flushing with immediate realese  . Penicillins Other (See Comments)    Unknown.[name]  Did it involve swelling of the face/tongue/throat, SOB, or low BP? Unknown Did it involve sudden or severe rash/hives, skin peeling, or any reaction on the inside of your mouth or nose? Unknown Did you need to seek medical attention at a hospital or doctor's office? Unknown When did it last happen?Childhood If all above answers are "NO", may proceed with cephalosporin use.    Patient Measurements: Height: 5\' 11"  (180.3 cm) Weight: 225 lb 15.5 oz (102.5 kg) IBW/kg (Calculated) : 75.3   Vital Signs: Temp: 97.8 F (36.6 C) (09/12 2046) Temp Source: Oral (09/12 2046) BP: 155/56 (09/12 2046) Pulse Rate: 74 (09/12 2046)  Labs: Recent Labs    08/13/19 1042 08/14/19 0701  08/15/19 0256 08/15/19 1142 08/15/19 1502 08/15/19 2331  HGB 9.4* 8.7*  --  8.7*  --   --   --   HCT 29.8* 28.3*  --  28.2*  --   --   --   PLT 270 276  --  306  --   --   --   LABPROT 16.9*  --   --   --  15.7*  --   --   INR 1.4*  --   --   --  1.3*  --   --   HEPARINUNFRC 0.12* 0.22*   < > 0.98*  --  1.00* 0.61  CREATININE  --  1.71*  --  1.50*  --   --   --    < > = values in this interval not displayed.    Estimated Creatinine Clearance: 55.9 mL/min (A) (by C-G formula based on SCr of 1.5 mg/dL (H)).   Assessment: 68 y.o. male with AVR for heparin    Goal of Therapy:  Heparin level 0.3-0.7 units/mL INR 2.5-3.5 Monitor platelets by anticoagulation protocol: Yes   Plan:  Continue Heparin at current rate   Phillis Knack, PharmD, BCPS

## 2019-08-16 NOTE — Progress Notes (Signed)
  Progress Note    08/16/2019 10:35 AM 3 Days Post-Op  Subjective: Pain better this morning  Vitals:   08/16/19 0800 08/16/19 0912  BP: (!) 120/51   Pulse:    Resp:    Temp: 98.5 F (36.9 C)   SpO2: 99% 96%    Physical Exam: Awake alert oriented and discussing sports On lab respirations Left above-knee amputation dressing is in place with some sanguinous ooze on the medial aspect  CBC    Component Value Date/Time   WBC 9.4 08/16/2019 0231   RBC 3.36 (L) 08/16/2019 0231   HGB 8.8 (L) 08/16/2019 0231   HGB 13.8 04/03/2018 0809   HCT 27.8 (L) 08/16/2019 0231   HCT 42.4 04/03/2018 0809   PLT 316 08/16/2019 0231   PLT 168 04/03/2018 0809   MCV 82.7 08/16/2019 0231   MCV 84 04/03/2018 0809   MCH 26.2 08/16/2019 0231   MCHC 31.7 08/16/2019 0231   RDW 18.3 (H) 08/16/2019 0231   RDW 15.5 (H) 04/03/2018 0809   LYMPHSABS 0.9 08/12/2019 1309   MONOABS 1.3 (H) 08/12/2019 1309   EOSABS 0.4 08/12/2019 1309   BASOSABS 0.1 08/12/2019 1309    BMET    Component Value Date/Time   NA 132 (L) 08/15/2019 0256   NA 139 03/13/2019 1511   K 4.3 08/15/2019 0256   CL 97 (L) 08/15/2019 0256   CO2 23 08/15/2019 0256   GLUCOSE 279 (H) 08/15/2019 0256   BUN 45 (H) 08/15/2019 0256   BUN 42 (H) 03/13/2019 1511   CREATININE 1.50 (H) 08/15/2019 0256   CREATININE 1.06 08/23/2014 0853   CALCIUM 8.9 08/15/2019 0256   GFRNONAA 46 (L) 08/15/2019 0256   GFRAA 54 (L) 08/15/2019 0256    INR    Component Value Date/Time   INR 1.2 08/16/2019 0231     Intake/Output Summary (Last 24 hours) at 08/16/2019 1035 Last data filed at 08/16/2019 0825 Gross per 24 hour  Intake 862.33 ml  Output 3875 ml  Net -3012.67 ml     Assessment/plan:  68 y.o. male is s/p conversion left below-knee to above-knee amputation.  Remains on heparin drip with transition to Coumadin.  Disposition will be rehab.    Weldon Nouri C. Donzetta Matters, MD [facility] [place] Office: [phone] Pager:  [phone]  08/16/2019 10:35 AM

## 2019-08-16 NOTE — Progress Notes (Signed)
Checked patient's water in CPAP machine.  Everything is ready for patient to wear tonight.  Patient self manages the machine.  No distress noted.

## 2019-08-16 NOTE — Progress Notes (Signed)
ANTICOAGULATION CONSULT NOTE - Follow Up Consult  Pharmacy Consult Heparin while INR<2.5 and to Restart warfarin Indication: Mech AVR  Allergies  Allergen Reactions  . Iohexol Anaphylaxis  . Niacin And Related     Flushing with immediate realese  . Penicillins Other (See Comments)    Unknown.[name]  Did it involve swelling of the face/tongue/throat, SOB, or low BP? Unknown Did it involve sudden or severe rash/hives, skin peeling, or any reaction on the inside of your mouth or nose? Unknown Did you need to seek medical attention at a hospital or doctor's office? Unknown When did it last happen?Childhood If all above answers are "NO", may proceed with cephalosporin use.    Patient Measurements: Height: 5\' 11"  (180.3 cm) Weight: 225 lb 15.5 oz (102.5 kg) IBW/kg (Calculated) : 75.3   Vital Signs: Temp: 100.1 F (37.8 C) (09/13 1539) Temp Source: Oral (09/13 1539) BP: 153/54 (09/13 1539) Pulse Rate: 89 (09/13 0503)  Labs: Recent Labs    08/14/19 0701  08/15/19 0256 08/15/19 1142  08/15/19 2331 08/16/19 0231 08/16/19 1446  HGB 8.7*  --  8.7*  --   --   --  8.8*  --   HCT 28.3*  --  28.2*  --   --   --  27.8*  --   PLT 276  --  306  --   --   --  316  --   LABPROT  --   --   --  15.7*  --   --  15.2  --   INR  --   --   --  1.3*  --   --  1.2  --   HEPARINUNFRC 0.22*   < > 0.98*  --    < > 0.61 0.69 0.63  CREATININE 1.71*  --  1.50*  --   --   --   --   --    < > = values in this interval not displayed.    Estimated Creatinine Clearance: 55.9 mL/min (A) (by C-G formula based on SCr of 1.5 mg/dL (H)).   Assessment: 14 YOM who has been continued on warfarin for mechanical AVR however due to AKA on 9/10 AM, warfarin was revered. Now that pt is finished with AKA, Pharmacy has been consulted to resume warfarin dosing and bridge with heparin until INR>2.5 -heparin level confirmed at goal  PTA warfarin dose: 10 mg daily except 12.5mg  TTS, admit INR  2.7 (goal 2.5-3.5)   Goal of Therapy:  Heparin level 0.3-0.7 units/mL INR 2.5-3.5 Monitor platelets by anticoagulation protocol: Yes   Plan:  - No heparin changes needed - Monitor CBC, INR, and heparin levels daily - Watch for signs/symptoms of bleeding  Hildred Laser, PharmD Clinical Pharmacist **Pharmacist phone directory can now be found on [email or web address] ([email or web address]).  Listed under [place].

## 2019-08-17 ENCOUNTER — Other Ambulatory Visit: Payer: Self-pay

## 2019-08-17 ENCOUNTER — Inpatient Hospital Stay (HOSPITAL_COMMUNITY)
Admission: RE | Admit: 2019-08-17 | Discharge: 2019-09-15 | DRG: 559 | Disposition: A | Discharge status: Home Health | Payer: Medicare Other | Source: Intra-hospital | Attending: Physical Medicine and Rehabilitation | Admitting: Physical Medicine and Rehabilitation

## 2019-08-17 ENCOUNTER — Encounter (HOSPITAL_COMMUNITY): Payer: Self-pay | Admitting: *Deleted

## 2019-08-17 DIAGNOSIS — R410 Disorientation, unspecified: Secondary | ICD-10-CM | POA: Diagnosis not present

## 2019-08-17 DIAGNOSIS — I10 Essential (primary) hypertension: Secondary | ICD-10-CM | POA: Diagnosis not present

## 2019-08-17 DIAGNOSIS — Z8249 Family history of ischemic heart disease and other diseases of the circulatory system: Secondary | ICD-10-CM

## 2019-08-17 DIAGNOSIS — R791 Abnormal coagulation profile: Secondary | ICD-10-CM | POA: Diagnosis present

## 2019-08-17 DIAGNOSIS — E1169 Type 2 diabetes mellitus with other specified complication: Secondary | ICD-10-CM

## 2019-08-17 DIAGNOSIS — E669 Obesity, unspecified: Secondary | ICD-10-CM

## 2019-08-17 DIAGNOSIS — R7401 Elevation of levels of liver transaminase levels: Secondary | ICD-10-CM | POA: Diagnosis not present

## 2019-08-17 DIAGNOSIS — Z794 Long term (current) use of insulin: Secondary | ICD-10-CM

## 2019-08-17 DIAGNOSIS — G546 Phantom limb syndrome with pain: Secondary | ICD-10-CM | POA: Diagnosis not present

## 2019-08-17 DIAGNOSIS — R740 Nonspecific elevation of levels of transaminase and lactic acid dehydrogenase [LDH]: Secondary | ICD-10-CM | POA: Diagnosis not present

## 2019-08-17 DIAGNOSIS — N184 Chronic kidney disease, stage 4 (severe): Secondary | ICD-10-CM | POA: Diagnosis present

## 2019-08-17 DIAGNOSIS — Z7901 Long term (current) use of anticoagulants: Secondary | ICD-10-CM | POA: Diagnosis not present

## 2019-08-17 DIAGNOSIS — Z89612 Acquired absence of left leg above knee: Secondary | ICD-10-CM

## 2019-08-17 DIAGNOSIS — Z952 Presence of prosthetic heart valve: Secondary | ICD-10-CM | POA: Diagnosis not present

## 2019-08-17 DIAGNOSIS — Z7951 Long term (current) use of inhaled steroids: Secondary | ICD-10-CM

## 2019-08-17 DIAGNOSIS — I4819 Other persistent atrial fibrillation: Secondary | ICD-10-CM | POA: Diagnosis not present

## 2019-08-17 DIAGNOSIS — Z9981 Dependence on supplemental oxygen: Secondary | ICD-10-CM

## 2019-08-17 DIAGNOSIS — G253 Myoclonus: Secondary | ICD-10-CM | POA: Diagnosis not present

## 2019-08-17 DIAGNOSIS — I2510 Atherosclerotic heart disease of native coronary artery without angina pectoris: Secondary | ICD-10-CM | POA: Diagnosis present

## 2019-08-17 DIAGNOSIS — Z880 Allergy status to penicillin: Secondary | ICD-10-CM

## 2019-08-17 DIAGNOSIS — I5022 Chronic systolic (congestive) heart failure: Secondary | ICD-10-CM | POA: Diagnosis not present

## 2019-08-17 DIAGNOSIS — F411 Generalized anxiety disorder: Secondary | ICD-10-CM | POA: Diagnosis not present

## 2019-08-17 DIAGNOSIS — M62838 Other muscle spasm: Secondary | ICD-10-CM | POA: Diagnosis not present

## 2019-08-17 DIAGNOSIS — Z87891 Personal history of nicotine dependence: Secondary | ICD-10-CM

## 2019-08-17 DIAGNOSIS — Z91041 Radiographic dye allergy status: Secondary | ICD-10-CM

## 2019-08-17 DIAGNOSIS — Z79899 Other long term (current) drug therapy: Secondary | ICD-10-CM

## 2019-08-17 DIAGNOSIS — I739 Peripheral vascular disease, unspecified: Secondary | ICD-10-CM

## 2019-08-17 DIAGNOSIS — I5082 Biventricular heart failure: Secondary | ICD-10-CM | POA: Diagnosis present

## 2019-08-17 DIAGNOSIS — I252 Old myocardial infarction: Secondary | ICD-10-CM

## 2019-08-17 DIAGNOSIS — Z4781 Encounter for orthopedic aftercare following surgical amputation: Principal | ICD-10-CM

## 2019-08-17 DIAGNOSIS — D72829 Elevated white blood cell count, unspecified: Secondary | ICD-10-CM | POA: Diagnosis not present

## 2019-08-17 DIAGNOSIS — R0789 Other chest pain: Secondary | ICD-10-CM | POA: Diagnosis not present

## 2019-08-17 DIAGNOSIS — Z833 Family history of diabetes mellitus: Secondary | ICD-10-CM

## 2019-08-17 DIAGNOSIS — R509 Fever, unspecified: Secondary | ICD-10-CM

## 2019-08-17 DIAGNOSIS — I351 Nonrheumatic aortic (valve) insufficiency: Secondary | ICD-10-CM

## 2019-08-17 DIAGNOSIS — F419 Anxiety disorder, unspecified: Secondary | ICD-10-CM | POA: Diagnosis present

## 2019-08-17 DIAGNOSIS — Z8349 Family history of other endocrine, nutritional and metabolic diseases: Secondary | ICD-10-CM

## 2019-08-17 DIAGNOSIS — R0989 Other specified symptoms and signs involving the circulatory and respiratory systems: Secondary | ICD-10-CM | POA: Diagnosis not present

## 2019-08-17 DIAGNOSIS — N179 Acute kidney failure, unspecified: Secondary | ICD-10-CM | POA: Diagnosis present

## 2019-08-17 DIAGNOSIS — N183 Chronic kidney disease, stage 3 unspecified: Secondary | ICD-10-CM | POA: Diagnosis present

## 2019-08-17 DIAGNOSIS — I5023 Acute on chronic systolic (congestive) heart failure: Secondary | ICD-10-CM | POA: Diagnosis not present

## 2019-08-17 DIAGNOSIS — E1165 Type 2 diabetes mellitus with hyperglycemia: Secondary | ICD-10-CM | POA: Diagnosis present

## 2019-08-17 DIAGNOSIS — E1122 Type 2 diabetes mellitus with diabetic chronic kidney disease: Secondary | ICD-10-CM | POA: Diagnosis present

## 2019-08-17 DIAGNOSIS — Z9049 Acquired absence of other specified parts of digestive tract: Secondary | ICD-10-CM

## 2019-08-17 DIAGNOSIS — E871 Hypo-osmolality and hyponatremia: Secondary | ICD-10-CM | POA: Diagnosis present

## 2019-08-17 DIAGNOSIS — S78112A Complete traumatic amputation at level between left hip and knee, initial encounter: Secondary | ICD-10-CM

## 2019-08-17 DIAGNOSIS — K5900 Constipation, unspecified: Secondary | ICD-10-CM | POA: Diagnosis present

## 2019-08-17 DIAGNOSIS — E11649 Type 2 diabetes mellitus with hypoglycemia without coma: Secondary | ICD-10-CM | POA: Diagnosis not present

## 2019-08-17 DIAGNOSIS — D62 Acute posthemorrhagic anemia: Secondary | ICD-10-CM | POA: Diagnosis present

## 2019-08-17 DIAGNOSIS — M2550 Pain in unspecified joint: Secondary | ICD-10-CM | POA: Diagnosis not present

## 2019-08-17 DIAGNOSIS — R7309 Other abnormal glucose: Secondary | ICD-10-CM

## 2019-08-17 DIAGNOSIS — E1151 Type 2 diabetes mellitus with diabetic peripheral angiopathy without gangrene: Secondary | ICD-10-CM | POA: Diagnosis present

## 2019-08-17 DIAGNOSIS — I130 Hypertensive heart and chronic kidney disease with heart failure and stage 1 through stage 4 chronic kidney disease, or unspecified chronic kidney disease: Secondary | ICD-10-CM | POA: Diagnosis present

## 2019-08-17 DIAGNOSIS — E119 Type 2 diabetes mellitus without complications: Secondary | ICD-10-CM

## 2019-08-17 DIAGNOSIS — I4821 Permanent atrial fibrillation: Secondary | ICD-10-CM | POA: Diagnosis present

## 2019-08-17 DIAGNOSIS — R001 Bradycardia, unspecified: Secondary | ICD-10-CM | POA: Diagnosis not present

## 2019-08-17 DIAGNOSIS — G92 Toxic encephalopathy: Secondary | ICD-10-CM | POA: Diagnosis not present

## 2019-08-17 DIAGNOSIS — Z7401 Bed confinement status: Secondary | ICD-10-CM | POA: Diagnosis not present

## 2019-08-17 DIAGNOSIS — I959 Hypotension, unspecified: Secondary | ICD-10-CM | POA: Diagnosis not present

## 2019-08-17 DIAGNOSIS — I5042 Chronic combined systolic (congestive) and diastolic (congestive) heart failure: Secondary | ICD-10-CM | POA: Diagnosis present

## 2019-08-17 DIAGNOSIS — G4733 Obstructive sleep apnea (adult) (pediatric): Secondary | ICD-10-CM | POA: Diagnosis present

## 2019-08-17 DIAGNOSIS — R29818 Other symptoms and signs involving the nervous system: Secondary | ICD-10-CM | POA: Diagnosis not present

## 2019-08-17 DIAGNOSIS — Z91018 Allergy to other foods: Secondary | ICD-10-CM

## 2019-08-17 DIAGNOSIS — R339 Retention of urine, unspecified: Secondary | ICD-10-CM | POA: Diagnosis not present

## 2019-08-17 HISTORY — DX: Chronic combined systolic (congestive) and diastolic (congestive) heart failure: I50.42

## 2019-08-17 HISTORY — DX: Atherosclerotic heart disease of native coronary artery without angina pectoris: I25.10

## 2019-08-17 HISTORY — DX: Chronic kidney disease, stage 4 (severe): N18.4

## 2019-08-17 HISTORY — DX: Type 2 diabetes mellitus with foot ulcer: E11.621

## 2019-08-17 HISTORY — DX: Acquired absence of left leg above knee: Z89.612

## 2019-08-17 HISTORY — DX: Hyperlipidemia, unspecified: E78.5

## 2019-08-17 HISTORY — DX: Disorder of arteries and arterioles, unspecified: I77.9

## 2019-08-17 HISTORY — DX: Nonrheumatic aortic (valve) insufficiency: I35.1

## 2019-08-17 LAB — CBC
HCT: 27.8 % — ABNORMAL LOW (ref 39.0–52.0)
Hemoglobin: 8.7 g/dL — ABNORMAL LOW (ref 13.0–17.0)
MCH: 26.6 pg (ref 26.0–34.0)
MCHC: 31.3 g/dL (ref 30.0–36.0)
MCV: 85 fL (ref 80.0–100.0)
Platelets: 356 10*3/uL (ref 150–400)
RBC: 3.27 MIL/uL — ABNORMAL LOW (ref 4.22–5.81)
RDW: 18.3 % — ABNORMAL HIGH (ref 11.5–15.5)
WBC: 11.1 10*3/uL — ABNORMAL HIGH (ref 4.0–10.5)
nRBC: 0 % (ref 0.0–0.2)

## 2019-08-17 LAB — BASIC METABOLIC PANEL
Anion gap: 10 (ref 5–15)
BUN: 43 mg/dL — ABNORMAL HIGH (ref 8–23)
CO2: 25 mmol/L (ref 22–32)
Calcium: 8.8 mg/dL — ABNORMAL LOW (ref 8.9–10.3)
Chloride: 98 mmol/L (ref 98–111)
Creatinine, Ser: 1.59 mg/dL — ABNORMAL HIGH (ref 0.61–1.24)
GFR calc Af Amer: 50 mL/min — ABNORMAL LOW (ref >60)
GFR calc non Af Amer: 43 mL/min — ABNORMAL LOW (ref >60)
Glucose, Bld: 263 mg/dL — ABNORMAL HIGH (ref 70–99)
Potassium: 4.6 mmol/L (ref 3.5–5.1)
Sodium: 133 mmol/L — ABNORMAL LOW (ref 135–145)

## 2019-08-17 LAB — GLUCOSE, CAPILLARY
Glucose-Capillary: 292 mg/dL — ABNORMAL HIGH (ref 70–99)
Glucose-Capillary: 202 mg/dL — ABNORMAL HIGH (ref 70–99)
Glucose-Capillary: 257 mg/dL — ABNORMAL HIGH (ref 70–99)

## 2019-08-17 LAB — PROTIME-INR
INR: 1.4 — ABNORMAL HIGH (ref 0.8–1.2)
Prothrombin Time: 16.5 seconds — ABNORMAL HIGH (ref 11.4–15.2)

## 2019-08-17 LAB — HEPARIN LEVEL (UNFRACTIONATED)
Heparin Unfractionated: 0.4 IU/mL (ref 0.30–0.70)
Heparin Unfractionated: 0.31 IU/mL (ref 0.30–0.70)

## 2019-08-17 MED ORDER — ALUM & MAG HYDROXIDE-SIMETH 200-200-20 MG/5ML PO SUSP: 30.0000 mL | ORAL | Status: DC | PRN

## 2019-08-17 MED ORDER — GUAIFENESIN-DM 100-10 MG/5ML PO SYRP: 5.0000 mL | ORAL_SOLUTION | Freq: Four times a day (QID) | ORAL | Status: DC | PRN

## 2019-08-17 MED ORDER — DOCUSATE SODIUM 100 MG PO CAPS
100.0000 mg | ORAL_CAPSULE | Freq: Two times a day (BID) | ORAL | Status: DC
  Administered 2019-08-17 – 2019-09-08 (×39): 100 mg via ORAL
  Filled 2019-08-17 (×45): qty 1

## 2019-08-17 MED ORDER — SACCHAROMYCES BOULARDII 250 MG PO CAPS: 250.0000 mg | ORAL_CAPSULE | ORAL | Status: DC

## 2019-08-17 MED ORDER — POLYETHYLENE GLYCOL 3350 17 G PO PACK: 17.0000 g | PACK | Freq: Every day | ORAL | Status: DC | PRN

## 2019-08-17 MED ORDER — WARFARIN SODIUM 10 MG PO TABS
10.0000 mg | ORAL_TABLET | Freq: Once | ORAL | Status: DC
  Administered 2019-08-17: 10 mg via ORAL
  Filled 2019-08-17: qty 1

## 2019-08-17 MED ORDER — TAMSULOSIN HCL 0.4 MG PO CAPS: 0.4000 mg | ORAL_CAPSULE | Freq: Every day | ORAL | Status: DC

## 2019-08-17 MED ORDER — SENNOSIDES-DOCUSATE SODIUM 8.6-50 MG PO TABS
2.0000 | ORAL_TABLET | Freq: Two times a day (BID) | ORAL | Status: DC
  Administered 2019-08-17 – 2019-09-08 (×37): 2 via ORAL
  Filled 2019-08-17 (×44): qty 2

## 2019-08-17 MED ORDER — VITAMIN C 500 MG PO TABS
500.0000 mg | ORAL_TABLET | Freq: Two times a day (BID) | ORAL | Status: DC
  Administered 2019-08-17 – 2019-09-15 (×58): 500 mg via ORAL
  Filled 2019-08-17 (×58): qty 1

## 2019-08-17 MED ORDER — TRAZODONE HCL 50 MG PO TABS: 25.0000 mg | ORAL_TABLET | Freq: Every evening | ORAL | Status: DC | PRN

## 2019-08-17 MED ORDER — SACCHAROMYCES BOULARDII 250 MG PO CAPS
250.0000 mg | ORAL_CAPSULE | ORAL | Status: DC
  Administered 2019-08-19 – 2019-09-14 (×12): 250 mg via ORAL
  Filled 2019-08-17 (×12): qty 1

## 2019-08-17 MED ORDER — GUAIFENESIN-DM 100-10 MG/5ML PO SYRP: 15.0000 mL | ORAL_SOLUTION | ORAL | Status: DC | PRN

## 2019-08-17 MED ORDER — BISACODYL 10 MG RE SUPP: 10.0000 mg | Freq: Every day | RECTAL | Status: DC | PRN

## 2019-08-17 MED ORDER — DOXYCYCLINE HYCLATE 100 MG PO TABS
100.0000 mg | ORAL_TABLET | Freq: Two times a day (BID) | ORAL | Status: DC
  Administered 2019-08-17 – 2019-08-18 (×2): 100 mg via ORAL
  Filled 2019-08-17 (×2): qty 1

## 2019-08-17 MED ORDER — TORSEMIDE 20 MG PO TABS: 40.0000 mg | ORAL_TABLET | Freq: Every day | ORAL | Status: DC

## 2019-08-17 MED ORDER — GABAPENTIN 300 MG PO CAPS
300.0000 mg | ORAL_CAPSULE | Freq: Three times a day (TID) | ORAL | Status: DC
  Administered 2019-08-17 – 2019-08-24 (×19): 300 mg via ORAL
  Filled 2019-08-17 (×20): qty 1

## 2019-08-17 MED ORDER — PROCHLORPERAZINE MALEATE 5 MG PO TABS
5.0000 mg | ORAL_TABLET | Freq: Four times a day (QID) | ORAL | Status: DC | PRN
  Administered 2019-08-23: 5 mg via ORAL
  Filled 2019-08-17: qty 2

## 2019-08-17 MED ORDER — HYDROCORTISONE 1 % EX CREA
TOPICAL_CREAM | Freq: Three times a day (TID) | CUTANEOUS | Status: DC
  Administered 2019-08-17 – 2019-08-19 (×5): via TOPICAL
  Administered 2019-08-19: 1 via TOPICAL
  Administered 2019-08-20 – 2019-09-03 (×39): via TOPICAL
  Administered 2019-09-04: 1 via TOPICAL
  Administered 2019-09-04 – 2019-09-06 (×6): via TOPICAL
  Administered 2019-09-06: 1 via TOPICAL
  Administered 2019-09-06 – 2019-09-15 (×21): via TOPICAL
  Filled 2019-08-17 (×4): qty 28

## 2019-08-17 MED ORDER — INSULIN GLARGINE 100 UNIT/ML ~~LOC~~ SOLN
40.0000 [IU] | Freq: Two times a day (BID) | SUBCUTANEOUS | Status: DC
  Administered 2019-08-17 – 2019-08-18 (×2): 40 [IU] via SUBCUTANEOUS
  Filled 2019-08-17 (×4): qty 0.4

## 2019-08-17 MED ORDER — DIGOXIN 125 MCG PO TABS
0.1250 mg | ORAL_TABLET | Freq: Every day | ORAL | Status: DC
  Administered 2019-08-18 – 2019-08-24 (×7): 0.125 mg via ORAL
  Filled 2019-08-17 (×7): qty 1

## 2019-08-17 MED ORDER — PROCHLORPERAZINE MALEATE 5 MG PO TABS: 5.0000 mg | ORAL_TABLET | Freq: Four times a day (QID) | ORAL | Status: DC | PRN

## 2019-08-17 MED ORDER — METOPROLOL SUCCINATE ER 25 MG PO TB24
25.0000 mg | ORAL_TABLET | Freq: Two times a day (BID) | ORAL | Status: DC
  Administered 2019-08-17 – 2019-08-20 (×5): 25 mg via ORAL
  Filled 2019-08-17 (×6): qty 1

## 2019-08-17 MED ORDER — INSULIN ASPART 100 UNIT/ML ~~LOC~~ SOLN
0.0000 [IU] | Freq: Three times a day (TID) | SUBCUTANEOUS | Status: DC
  Administered 2019-08-18: 15 [IU] via SUBCUTANEOUS
  Administered 2019-08-18: 13:00:00 11 [IU] via SUBCUTANEOUS
  Administered 2019-08-18: 18:00:00 7 [IU] via SUBCUTANEOUS
  Administered 2019-08-19: 19:00:00 4 [IU] via SUBCUTANEOUS
  Administered 2019-08-19: 11 [IU] via SUBCUTANEOUS
  Administered 2019-08-19: 3 [IU] via SUBCUTANEOUS
  Administered 2019-08-20: 4 [IU] via SUBCUTANEOUS
  Administered 2019-08-20: 20 [IU] via SUBCUTANEOUS
  Administered 2019-08-20: 10 [IU] via SUBCUTANEOUS
  Administered 2019-08-22: 3 [IU] via SUBCUTANEOUS
  Administered 2019-08-23 – 2019-08-25 (×5): 7 [IU] via SUBCUTANEOUS
  Administered 2019-08-25: 3 [IU] via SUBCUTANEOUS
  Administered 2019-08-26 (×3): 7 [IU] via SUBCUTANEOUS
  Administered 2019-08-27: 15 [IU] via SUBCUTANEOUS
  Administered 2019-08-27 – 2019-08-28 (×4): 4 [IU] via SUBCUTANEOUS
  Administered 2019-08-28: 11 [IU] via SUBCUTANEOUS
  Administered 2019-08-29: 3 [IU] via SUBCUTANEOUS
  Administered 2019-08-29: 4 [IU] via SUBCUTANEOUS
  Administered 2019-08-29: 12:00:00 7 [IU] via SUBCUTANEOUS
  Administered 2019-08-30 (×2): 4 [IU] via SUBCUTANEOUS
  Administered 2019-08-31: 13:00:00 11 [IU] via SUBCUTANEOUS
  Administered 2019-09-01: 08:00:00 3 [IU] via SUBCUTANEOUS
  Administered 2019-09-01: 18:00:00 4 [IU] via SUBCUTANEOUS
  Administered 2019-09-01: 13:00:00 11 [IU] via SUBCUTANEOUS
  Administered 2019-09-02: 14:00:00 7 [IU] via SUBCUTANEOUS
  Administered 2019-09-03 – 2019-09-04 (×2): 4 [IU] via SUBCUTANEOUS
  Administered 2019-09-05 – 2019-09-06 (×2): 3 [IU] via SUBCUTANEOUS
  Administered 2019-09-07: 11 [IU] via SUBCUTANEOUS
  Administered 2019-09-07: 18:00:00 3 [IU] via SUBCUTANEOUS
  Administered 2019-09-07: 4 [IU] via SUBCUTANEOUS
  Administered 2019-09-08: 3 [IU] via SUBCUTANEOUS
  Administered 2019-09-09: 13:00:00 4 [IU] via SUBCUTANEOUS

## 2019-08-17 MED ORDER — TAMSULOSIN HCL 0.4 MG PO CAPS
0.8000 mg | ORAL_CAPSULE | Freq: Every day | ORAL | Status: DC
  Administered 2019-08-17 – 2019-09-14 (×29): 0.8 mg via ORAL
  Filled 2019-08-17 (×30): qty 2

## 2019-08-17 MED ORDER — GABAPENTIN 300 MG PO CAPS: 300.0000 mg | ORAL_CAPSULE | Freq: Three times a day (TID) | ORAL | Status: DC

## 2019-08-17 MED ORDER — CYCLOBENZAPRINE HCL 10 MG PO TABS: 10.0000 mg | ORAL_TABLET | Freq: Three times a day (TID) | ORAL | Status: DC

## 2019-08-17 MED ORDER — METOPROLOL SUCCINATE ER 25 MG PO TB24: 25.0000 mg | ORAL_TABLET | Freq: Two times a day (BID) | ORAL | Status: DC

## 2019-08-17 MED ORDER — SACUBITRIL-VALSARTAN 24-26 MG PO TABS
1.0000 | ORAL_TABLET | Freq: Two times a day (BID) | ORAL | Status: DC
  Administered 2019-08-17 – 2019-09-15 (×57): 1 via ORAL
  Filled 2019-08-17 (×59): qty 1

## 2019-08-17 MED ORDER — MOMETASONE FURO-FORMOTEROL FUM 200-5 MCG/ACT IN AERO: 2.0000 | INHALATION_SPRAY | Freq: Two times a day (BID) | RESPIRATORY_TRACT | Status: DC

## 2019-08-17 MED ORDER — ALBUTEROL SULFATE (2.5 MG/3ML) 0.083% IN NEBU
3.0000 mL | INHALATION_SOLUTION | Freq: Four times a day (QID) | RESPIRATORY_TRACT | Status: DC | PRN
  Administered 2019-09-04 – 2019-09-10 (×5): 3 mL via RESPIRATORY_TRACT
  Filled 2019-08-17 (×5): qty 3

## 2019-08-17 MED ORDER — OXYCODONE HCL 5 MG PO TABS
5.0000 mg | ORAL_TABLET | ORAL | Status: DC | PRN
  Administered 2019-08-17 – 2019-08-20 (×4): 10 mg via ORAL
  Administered 2019-08-21: 5 mg via ORAL
  Administered 2019-08-25 – 2019-09-14 (×13): 10 mg via ORAL
  Filled 2019-08-17 (×3): qty 2
  Filled 2019-08-17: qty 1
  Filled 2019-08-17 (×5): qty 2
  Filled 2019-08-17: qty 1
  Filled 2019-08-17 (×11): qty 2

## 2019-08-17 MED ORDER — PANTOPRAZOLE SODIUM 40 MG PO TBEC
40.0000 mg | DELAYED_RELEASE_TABLET | Freq: Every day | ORAL | Status: DC
  Administered 2019-08-18 – 2019-09-15 (×29): 40 mg via ORAL
  Filled 2019-08-17 (×29): qty 1

## 2019-08-17 MED ORDER — DIPHENHYDRAMINE HCL 12.5 MG/5ML PO ELIX: 12.5000 mg | ORAL_SOLUTION | Freq: Four times a day (QID) | ORAL | Status: DC | PRN

## 2019-08-17 MED ORDER — TORSEMIDE 20 MG PO TABS
20.0000 mg | ORAL_TABLET | Freq: Every day | ORAL | Status: DC
  Administered 2019-08-17 – 2019-08-25 (×8): 20 mg via ORAL
  Filled 2019-08-17 (×9): qty 1

## 2019-08-17 MED ORDER — FENOFIBRATE 160 MG PO TABS
160.0000 mg | ORAL_TABLET | Freq: Every day | ORAL | Status: DC
  Administered 2019-08-18 – 2019-09-15 (×29): 160 mg via ORAL
  Filled 2019-08-17 (×29): qty 1

## 2019-08-17 MED ORDER — ACETAMINOPHEN 325 MG PO TABS
325.0000 mg | ORAL_TABLET | ORAL | Status: DC | PRN
  Administered 2019-08-18 – 2019-09-12 (×7): 650 mg via ORAL
  Filled 2019-08-17 (×7): qty 2

## 2019-08-17 MED ORDER — LIDOCAINE HCL URETHRAL/MUCOSAL 2 % EX GEL: CUTANEOUS | Status: DC | PRN

## 2019-08-17 MED ORDER — INSULIN GLARGINE 100 UNIT/ML ~~LOC~~ SOLN: 33.0000 [IU] | Freq: Two times a day (BID) | SUBCUTANEOUS | Status: DC

## 2019-08-17 MED ORDER — BETHANECHOL CHLORIDE 10 MG PO TABS
10.0000 mg | ORAL_TABLET | Freq: Four times a day (QID) | ORAL | Status: DC
  Administered 2019-08-17 – 2019-08-18 (×2): 10 mg via ORAL
  Filled 2019-08-17 (×3): qty 1

## 2019-08-17 MED ORDER — GLIMEPIRIDE 2 MG PO TABS: 2.0000 mg | ORAL_TABLET | Freq: Every day | ORAL | Status: DC

## 2019-08-17 MED ORDER — PROCHLORPERAZINE EDISYLATE 10 MG/2ML IJ SOLN: 5.0000 mg | Freq: Four times a day (QID) | INTRAMUSCULAR | Status: DC | PRN

## 2019-08-17 MED ORDER — GLIMEPIRIDE 2 MG PO TABS
2.0000 mg | ORAL_TABLET | Freq: Every day | ORAL | Status: DC
  Administered 2019-08-18 – 2019-09-15 (×28): 2 mg via ORAL
  Filled 2019-08-17 (×29): qty 1

## 2019-08-17 MED ORDER — INSULIN ASPART 100 UNIT/ML ~~LOC~~ SOLN: 0.0000 [IU] | Freq: Every day | SUBCUTANEOUS | Status: DC

## 2019-08-17 MED ORDER — LIDOCAINE HCL URETHRAL/MUCOSAL 2 % EX GEL
CUTANEOUS | Status: DC | PRN
  Administered 2019-08-23 – 2019-08-28 (×4): 5 via TOPICAL
  Filled 2019-08-17 (×2): qty 5

## 2019-08-17 MED ORDER — POLYETHYLENE GLYCOL 3350 17 G PO PACK
17.0000 g | PACK | Freq: Two times a day (BID) | ORAL | Status: DC
  Administered 2019-08-17 – 2019-09-06 (×30): 17 g via ORAL
  Filled 2019-08-17 (×42): qty 1

## 2019-08-17 MED ORDER — TRAMADOL HCL 50 MG PO TABS: 50.0000 mg | ORAL_TABLET | Freq: Four times a day (QID) | ORAL | Status: DC | PRN

## 2019-08-17 MED ORDER — B COMPLEX-C PO TABS
1.0000 | ORAL_TABLET | Freq: Every day | ORAL | Status: DC
  Administered 2019-08-18 – 2019-09-15 (×29): 1 via ORAL
  Filled 2019-08-17 (×29): qty 1

## 2019-08-17 MED ORDER — VITAMIN C 500 MG PO TABS: 500.0000 mg | ORAL_TABLET | Freq: Two times a day (BID) | ORAL | Status: DC

## 2019-08-17 MED ORDER — ESCITALOPRAM OXALATE 10 MG PO TABS: 10.0000 mg | ORAL_TABLET | Freq: Every day | ORAL | Status: DC

## 2019-08-17 MED ORDER — WARFARIN SODIUM 5 MG PO TABS: 10.0000 mg | ORAL_TABLET | Freq: Once | ORAL | Status: DC

## 2019-08-17 MED ORDER — PRO-STAT SUGAR FREE PO LIQD
30.0000 mL | Freq: Two times a day (BID) | ORAL | Status: DC
  Administered 2019-08-17 – 2019-09-14 (×53): 30 mL via ORAL
  Filled 2019-08-17 (×57): qty 30

## 2019-08-17 MED ORDER — FLEET ENEMA 7-19 GM/118ML RE ENEM: 1.0000 | ENEMA | Freq: Once | RECTAL | Status: DC | PRN

## 2019-08-17 MED ORDER — ICOSAPENT ETHYL 1 G PO CAPS: 2.0000 g | ORAL_CAPSULE | Freq: Two times a day (BID) | ORAL | Status: DC

## 2019-08-17 MED ORDER — EZETIMIBE 10 MG PO TABS
10.0000 mg | ORAL_TABLET | Freq: Every day | ORAL | Status: DC
  Administered 2019-08-18 – 2019-09-15 (×29): 10 mg via ORAL
  Filled 2019-08-17 (×29): qty 1

## 2019-08-17 MED ORDER — HEPARIN (PORCINE) 25000 UT/250ML-% IV SOLN
1800.0000 [IU]/h | INTRAVENOUS | Status: DC
  Administered 2019-08-17 – 2019-08-20 (×5): 1500 [IU]/h via INTRAVENOUS
  Administered 2019-08-21 – 2019-08-24 (×6): 1800 [IU]/h via INTRAVENOUS
  Filled 2019-08-17 (×11): qty 250

## 2019-08-17 MED ORDER — ALBUTEROL SULFATE (2.5 MG/3ML) 0.083% IN NEBU: 2.5000 mg | INHALATION_SOLUTION | Freq: Four times a day (QID) | RESPIRATORY_TRACT | Status: DC | PRN

## 2019-08-17 MED ORDER — CYCLOBENZAPRINE HCL 5 MG PO TABS
5.0000 mg | ORAL_TABLET | Freq: Three times a day (TID) | ORAL | Status: DC
  Administered 2019-08-17 – 2019-09-15 (×83): 5 mg via ORAL
  Filled 2019-08-17 (×87): qty 1

## 2019-08-17 MED ORDER — NITROGLYCERIN 0.4 MG SL SUBL: 0.4000 mg | SUBLINGUAL_TABLET | SUBLINGUAL | Status: DC | PRN

## 2019-08-17 MED ORDER — EZETIMIBE 10 MG PO TABS: 10.0000 mg | ORAL_TABLET | Freq: Every day | ORAL | Status: DC

## 2019-08-17 MED ORDER — TRAZODONE HCL 50 MG PO TABS
25.0000 mg | ORAL_TABLET | Freq: Every evening | ORAL | Status: DC | PRN
  Administered 2019-08-17 – 2019-09-13 (×9): 50 mg via ORAL
  Filled 2019-08-17 (×11): qty 1

## 2019-08-17 MED ORDER — PROCHLORPERAZINE 25 MG RE SUPP: 12.5000 mg | Freq: Four times a day (QID) | RECTAL | Status: DC | PRN

## 2019-08-17 MED ORDER — VITAMIN D 25 MCG (1000 UNIT) PO TABS
1000.0000 [IU] | ORAL_TABLET | Freq: Every day | ORAL | Status: DC
  Administered 2019-08-18 – 2019-09-15 (×29): 1000 [IU] via ORAL
  Filled 2019-08-17 (×29): qty 1

## 2019-08-17 MED ORDER — HYDROCODONE-ACETAMINOPHEN 10-325 MG PO TABS: 1.0000 | ORAL_TABLET | ORAL | Status: DC | PRN

## 2019-08-17 MED ORDER — SENNOSIDES-DOCUSATE SODIUM 8.6-50 MG PO TABS: 2.0000 | ORAL_TABLET | Freq: Every day | ORAL | Status: DC

## 2019-08-17 MED ORDER — ALUM & MAG HYDROXIDE-SIMETH 200-200-20 MG/5ML PO SUSP
15.0000 mL | ORAL | Status: DC | PRN
  Administered 2019-08-23 – 2019-09-14 (×3): 30 mL via ORAL
  Filled 2019-08-17 (×4): qty 30

## 2019-08-17 MED ORDER — INSULIN ASPART 100 UNIT/ML ~~LOC~~ SOLN: 0.0000 [IU] | Freq: Three times a day (TID) | SUBCUTANEOUS | Status: DC

## 2019-08-17 MED ORDER — TAMSULOSIN HCL 0.4 MG PO CAPS: 0.8000 mg | ORAL_CAPSULE | Freq: Every day | ORAL | Status: DC

## 2019-08-17 MED ORDER — OXYCODONE-ACETAMINOPHEN 5-325 MG PO TABS: 1.0000 | ORAL_TABLET | ORAL | Status: DC | PRN

## 2019-08-17 MED ORDER — LIRAGLUTIDE 18 MG/3ML ~~LOC~~ SOPN: 1.8000 mg | PEN_INJECTOR | Freq: Every day | SUBCUTANEOUS | Status: DC

## 2019-08-17 MED ORDER — VITAMIN D 25 MCG (1000 UNIT) PO TABS: 1000.0000 [IU] | ORAL_TABLET | Freq: Every day | ORAL | Status: DC

## 2019-08-17 MED ORDER — INSULIN ASPART 100 UNIT/ML ~~LOC~~ SOLN
0.0000 [IU] | Freq: Every day | SUBCUTANEOUS | Status: DC
  Administered 2019-08-17: 4 [IU] via SUBCUTANEOUS
  Administered 2019-08-18: 22:00:00 2 [IU] via SUBCUTANEOUS
  Administered 2019-08-19: 3 [IU] via SUBCUTANEOUS
  Administered 2019-08-20 – 2019-08-26 (×2): 2 [IU] via SUBCUTANEOUS
  Administered 2019-08-27: 4 [IU] via SUBCUTANEOUS
  Administered 2019-08-28 – 2019-09-01 (×3): 2 [IU] via SUBCUTANEOUS
  Administered 2019-09-04: 3 [IU] via SUBCUTANEOUS

## 2019-08-17 MED ORDER — MILK AND MOLASSES ENEMA
1.0000 | Freq: Once | RECTAL | Status: DC
  Filled 2019-08-17 (×2): qty 240

## 2019-08-17 MED ORDER — DIGOXIN 125 MCG PO TABS: 0.1250 mg | ORAL_TABLET | Freq: Every day | ORAL | Status: DC

## 2019-08-17 MED ORDER — WARFARIN - PHARMACIST DOSING INPATIENT
Freq: Every day | Status: DC
  Administered 2019-08-18 – 2019-09-08 (×16)

## 2019-08-17 MED ORDER — TORSEMIDE 20 MG PO TABS: 20.0000 mg | ORAL_TABLET | Freq: Every evening | ORAL | Status: DC

## 2019-08-17 MED ORDER — FENOFIBRATE 160 MG PO TABS: 160.0000 mg | ORAL_TABLET | Freq: Every day | ORAL | Status: DC

## 2019-08-17 MED ORDER — MOMETASONE FURO-FORMOTEROL FUM 200-5 MCG/ACT IN AERO
2.0000 | INHALATION_SPRAY | Freq: Two times a day (BID) | RESPIRATORY_TRACT | Status: DC
  Administered 2019-08-17 – 2019-09-15 (×55): 2 via RESPIRATORY_TRACT
  Filled 2019-08-17 (×2): qty 8.8

## 2019-08-17 MED ORDER — OXYCODONE HCL 5 MG PO TABS: 5.0000 mg | ORAL_TABLET | ORAL | Status: DC | PRN

## 2019-08-17 MED ORDER — ACETAMINOPHEN 325 MG PO TABS: 325.0000 mg | ORAL_TABLET | ORAL | Status: DC | PRN

## 2019-08-17 MED ORDER — TORSEMIDE 20 MG PO TABS
40.0000 mg | ORAL_TABLET | Freq: Every day | ORAL | Status: DC
  Administered 2019-08-18 – 2019-09-15 (×29): 40 mg via ORAL
  Filled 2019-08-17 (×29): qty 2

## 2019-08-17 MED ORDER — ESCITALOPRAM OXALATE 10 MG PO TABS
10.0000 mg | ORAL_TABLET | Freq: Every day | ORAL | Status: DC
  Administered 2019-08-17 – 2019-09-14 (×28): 10 mg via ORAL
  Filled 2019-08-17 (×31): qty 1

## 2019-08-17 NOTE — Care Management Important Message (Signed)
Important Message  Patient Details  Name: Joseph Hoffman MRN: [id number] Date of Birth: 1951-03-29   Medicare Important Message Given:  Yes     [name] 08/17/2019, 1:43 PM

## 2019-08-17 NOTE — Progress Notes (Signed)
ANTICOAGULATION CONSULT NOTE - Follow Up Consult  Pharmacy Consult for Hep/Coum Indication: Mechanical valve  Allergies  Allergen Reactions  . Iohexol Anaphylaxis  . Niacin And Related     Flushing with immediate realese  . Penicillins Other (See Comments)    Unknown.[name]  Did it involve swelling of the face/tongue/throat, SOB, or low BP? Unknown Did it involve sudden or severe rash/hives, skin peeling, or any reaction on the inside of your mouth or nose? Unknown Did you need to seek medical attention at a hospital or doctor's office? Unknown When did it last happen?Childhood If all above answers are "NO", may proceed with cephalosporin use.    Patient Measurements: Height: 5\' 11"  (180.3 cm) Weight: 225 lb 15.5 oz (102.5 kg) IBW/kg (Calculated) : 75.3 Heparin Dosing Weight:   Vital Signs: Temp: 98.8 F (37.1 C) (09/14 0823) Temp Source: Oral (09/14 0823) BP: 131/62 (09/14 0823) Pulse Rate: 82 (09/14 0823)  Labs: Recent Labs    08/15/19 0256 08/15/19 1142  08/16/19 0231 08/16/19 1446 08/17/19 0555  HGB 8.7*  --   --  8.8*  --  8.7*  HCT 28.2*  --   --  27.8*  --  27.8*  PLT 306  --   --  316  --  356  LABPROT  --  15.7*  --  15.2  --  16.5*  INR  --  1.3*  --  1.2  --  1.4*  HEPARINUNFRC 0.98*  --    < > 0.69 0.63 0.40  CREATININE 1.50*  --   --   --   --   --    < > = values in this interval not displayed.    Estimated Creatinine Clearance: 55.9 mL/min (A) (by C-G formula based on SCr of 1.5 mg/dL (H)).  Assessment:  Anticoag: warfarin for mech AVR, reversed 9/9 for AKA. - HL 0.4 in goal. INR 1.4. Hgb 8.7 post-op. Plts WNL PTA warfarin dose - 10 mg daily except 12.5mg  TTS, admit INR 2.7 (goal 2.5-3.5)  Goal of Therapy:  INR 2.5-3.5 Monitor platelets by anticoagulation protocol: Yes   Plan:  IV heparin 1500 units/hr Warfarin 10mg  x 1 tonight Daily HL, CBC, and INR Bridging with IV heparin until INR >2.5  Jammie Troup S.  Alford Highland, PharmD, BCPS Clinical Staff Pharmacist Eilene Ghazi Stillinger 08/17/2019,9:20 AM

## 2019-08-17 NOTE — H&P (Signed)
Physical Medicine and Rehabilitation Admission H&P     CC: Functional deficit due to L-AKA     HPI:  Joseph Hoffman is a 68 year old male with history of T2DM, CAD, CAF, mechanical AVR- on coumadin, OSA-CPAP with 2 L oxygen at nights, left great toe amputation 07/03/2019 who was admitted with nausea vomiting, poor wound healing and purulent drainage.  He was started on IV antibiotics and underwent left BKA on 07/31/2019 by Dr. Carlis Abbott.  Hospital course complicated by hypotension with AKI, urinary retention, lethargy with bouts of confusion felt to be due to narcotics as well as subtherapeutic INR requiring heparin/Coumadin bridge.  Therapy evaluations done revealing functional deficits and CIR was recommended for follow-up therapy.  He was admitted for comprehensive inpatient rehab on 08/05/2019.     He was maintained on IV heparin until INR greater than 2.5.  Pain control was reasonable with prn use of  oxycodone and Robaxin.  Foley was discontinued past admission and he was started on bladder program.  He continued to have problems with urinary retention requiring in and out caths at least 4-5 times a day and was placed on 1500 cc fluid restriction due to high PVRs.  Laxatives were added to help with OIC.  He was noted to develop increasing pain with edema of amputation site on 09/09.  Wound showed signs of ischemia and Dr. Carlis Abbott was consulted for input.  Surgical intervention recommended and patient was discharged to acute hospital for revision of wound to left AKA on zero 9/10 a.m.  Postop pain control is improving.  He continues to have serosanguineous drainage from amputation site and currently on IV heparin/Coumadin bridge as INR subtherapeutic.  Follow-up CBC shows H&H to be relatively stable.  Therapy was resumed and patient continues to have limitations in mobility and ADLs.  He was cleared to resume CIR program on 09/14.   .Review of Systems  Constitutional: Negative for chills and fever.   HENT: Negative for hearing loss and tinnitus.   Eyes: Negative for blurred vision and double vision.  Respiratory: Negative for cough and shortness of breath.   Cardiovascular: Negative for chest pain and palpitations.  Gastrointestinal: Positive for constipation (hasn't had BM since surgery. ). Negative for heartburn.  Genitourinary: Negative for dysuria and urgency.       Now "peeing like a horse"  Skin: Negative for rash.  Neurological: Positive for dizziness (with activity), sensory change and weakness. Negative for speech change and focal weakness.  Psychiatric/Behavioral: The patient is not nervous/anxious and does not have insomnia.             Past Medical History:  Diagnosis Date   Anxiety     Aortic stenosis     Arthritis     Asthma     Asymptomatic LV dysfunction, EF 45%, normal coronary arteries on cardiac cath 09/18/12 09/30/2012   CHF (congestive heart failure) (HCC)     Chronic anticoagulation, on Xarelto prior to admit 09/30/2012   DM (diabetes mellitus) ([place]) 09/30/2012   Hepatitis B 2003   Hypertension     Myocardial infarction [facility])     Permanent atrial fibrillation, since 1994 09/30/2012    stress test 02/08/12- normal study, no significant ischemia   S/P AVR (aortic valve replacement), 09/30/2012    a. s/p mechcanical AVR in 09/2012 (on Coumadin)   Sleep apnea      uses CPAP        Past Surgical History:  Procedure Laterality Date   ABDOMINAL ANGIOGRAM   09/18/2012    Procedure: ABDOMINAL ANGIOGRAM;  Surgeon: Troy Sine, MD;  Location: [facility] CATH LAB;  Service: Cardiovascular;;   ABDOMINAL AORTOGRAM W/LOWER EXTREMITY N/A 07/02/2019    Procedure: ABDOMINAL AORTOGRAM W/LOWER EXTREMITY;  Surgeon: Marty Heck, MD;  Location: [place] CV LAB;  Service: Cardiovascular;  Laterality: N/A;   AMPUTATION Left 07/03/2019    Procedure: AMPUTATION LEFT GREAT TOE;  Surgeon: Marty Heck, MD;  Location: [place];  Service: Vascular;   Laterality: Left;   AMPUTATION Left 07/31/2019    Procedure: AMPUTATION BELOW KNEE LEFT;  Surgeon: Marty Heck, MD;  Location: [place];  Service: Vascular;  Laterality: Left;   AMPUTATION Left 08/13/2019    Procedure: LEFT AMPUTATION ABOVE KNEE;  Surgeon: Marty Heck, MD;  Location: [place];  Service: Vascular;  Laterality: Left;   AORTIC VALVE REPLACEMENT   09/29/2012    Procedure: AORTIC VALVE REPLACEMENT (AVR);  Surgeon: Gaye Pollack, MD;  Location: [place];  Service: Open Heart Surgery;  Laterality: N/A;   ARCH AORTOGRAM   09/18/2012    Procedure: ARCH AORTOGRAM;  Surgeon: Troy Sine, MD;  Location: [facility] CATH LAB;  Service: Cardiovascular;;   CARDIAC CATHETERIZATION   09/18/12    severe calcific aortic stenosis, peak gradient 57mm, mean gradient 45mm, EF 45%   CARDIAC VALVE REPLACEMENT        AVR 09-29-12   CHOLECYSTECTOMY       FRACTURE SURGERY       LEFT AND RIGHT HEART CATHETERIZATION WITH CORONARY ANGIOGRAM N/A 09/18/2012    Procedure: LEFT AND RIGHT HEART CATHETERIZATION WITH CORONARY ANGIOGRAM;  Surgeon: Troy Sine, MD;  Location: [facility] CATH LAB;  Service: Cardiovascular;  Laterality: N/A;   PERIPHERAL VASCULAR INTERVENTION Left 07/02/2019    Procedure: PERIPHERAL VASCULAR INTERVENTION;  Surgeon: Marty Heck, MD;  Location: [place] CV LAB;  Service: Cardiovascular;  Laterality: Left;   RIGHT HEART CATH AND CORONARY ANGIOGRAPHY N/A 06/29/2019    Procedure: RIGHT HEART CATH AND CORONARY ANGIOGRAPHY;  Surgeon: Nelva Bush, MD;  Location: [place] CV LAB;  Service: Cardiovascular;  Laterality: N/A;   TEE WITHOUT CARDIOVERSION N/A 05/29/2019    Procedure: TRANSESOPHAGEAL ECHOCARDIOGRAM (TEE);  Surgeon: Lelon Perla, MD;  Location: [facility] ENDOSCOPY;  Service: Cardiovascular;  Laterality: N/A;   Family History  Problem Relation Age of Onset   Hypertension Mother     Diabetes Father     Heart attack Brother 68   Hyperlipidemia Brother  51        stents placed     Social History:  reports that he quit smoking about 36 years ago. His smoking use included cigarettes. He has a 2.50 pack-year smoking history. He has quit using smokeless tobacco. He reports current alcohol use. He reports that he does not use drugs.           Allergies  Allergen Reactions   Iohexol Anaphylaxis   Niacin And Related        Flushing with immediate realese   Penicillins Other (See Comments)      Unknown.[name]   Did it involve swelling of the face/tongue/throat, SOB, or low BP? Unknown Did it involve sudden or severe rash/hives, skin peeling, or any reaction on the inside of your mouth or nose? Unknown Did you need to seek medical attention at a hospital or doctor's office? Unknown When did it last happen?  Childhood If all above answers are NO, may proceed with cephalosporin use.           Medications Prior to Admission  Medication Sig Dispense Refill   acetaminophen (TYLENOL) 325 MG tablet Take 2 tablets (650 mg total) by mouth every 4 (four) hours as needed for pain or fever. (Patient taking differently: Take 650 mg by mouth every 4 (four) hours as needed for fever or headache (pain). )       albuterol (PROVENTIL HFA;VENTOLIN HFA) 108 (90 BASE) MCG/ACT inhaler Inhale 2 puffs into the lungs every 6 (six) hours as needed for wheezing or shortness of breath.        B Complex-C (B-COMPLEX WITH VITAMIN C) tablet Take 1 tablet by mouth daily. 30 tablet 0   B-D ULTRAFINE III SHORT PEN 31G X 8 MM MISC         cholecalciferol (VITAMIN D) 1000 UNITS tablet Take 1,000 Units by mouth daily with breakfast.        digoxin (LANOXIN) 0.125 MG tablet TAKE 1 TABLET EVERY DAY (Patient taking differently: Take 0.125 mg by mouth daily. ) 90 tablet 1   EASY TOUCH LANCETS 30G/TWIST MISC         escitalopram (LEXAPRO) 10 MG tablet Take 10 mg by mouth at bedtime.        ezetimibe (ZETIA) 10 MG tablet Take 1 tablet (10 mg  total) by mouth daily. 90 tablet 3   fenofibrate 160 MG tablet Take 1 tablet (160 mg total) by mouth daily. (Patient taking differently: Take 160 mg by mouth daily with breakfast. ) 30 tablet 1   Fluticasone-Salmeterol (ADVAIR) 250-50 MCG/DOSE AEPB Inhale 1 puff into the lungs 2 (two) times daily as needed (shortness of breath/asthma related symptoms).        gabapentin (NEURONTIN) 300 MG capsule Take 1 capsule (300 mg total) by mouth 3 (three) times daily. 90 capsule 0   heparin 25000-0.45 UT/250ML-% infusion Inject 2,650 Units/hr into the vein continuous. 250 mL 0   Icosapent Ethyl (VASCEPA) 1 g CAPS Take 2 capsules (2 g total) by mouth 2 (two) times daily. (Patient taking differently: Take 2 g by mouth 2 (two) times daily with a meal. ) 360 capsule 3   insulin glargine (LANTUS) 100 UNIT/ML injection Inject 15-40 Units into the skin See admin instructions. Inject 15 - 40 units subcutaneously daily before breakfast (adjusted per CBG); inject 30-40 units at bedtime ( occasionally adjusted per CBG - usually 35 units)       liraglutide (VICTOZA) 18 MG/3ML SOPN Inject 1.8 mg into the skin at bedtime.       metoprolol succinate (TOPROL XL) 25 MG 24 hr tablet Take 1 tablet (25 mg total) by mouth 2 (two) times daily. 90 tablet 1   nitroGLYCERIN (NITROSTAT) 0.4 MG SL tablet Place 1 tablet (0.4 mg total) under the tongue every 5 (five) minutes as needed for chest pain. (Patient taking differently: Place 0.4 mg under the tongue every 5 (five) minutes x 3 doses as needed for chest pain. ) 30 tablet 2   oxyCODONE-acetaminophen (PERCOCET) 10-325 MG tablet Take 1 tablet by mouth every 4 (four) hours as needed for pain. 20 tablet 0   saccharomyces boulardii (FLORASTOR) 250 MG capsule Take 250 mg by mouth 3 (three) times a week.        sacubitril-valsartan (ENTRESTO) 24-26 MG Take 1 tablet by mouth 2 (two) times daily. 60 tablet 3   tamsulosin (FLOMAX) 0.4 MG CAPS capsule Take  1 capsule (0.4 mg total) by  mouth daily after supper. 30 capsule 0   torsemide (DEMADEX) 20 MG tablet Take 2 tablets (40 mg total) by mouth every morning. (Patient taking differently: Take 20-40 mg by mouth See admin instructions. Take 2 tablets (40 mg) by mouth daily in the morning & take 1 tablet (20 mg) by mouth at night) 60 tablet 3   vitamin C (ASCORBIC ACID) 500 MG tablet Take 500 mg by mouth 2 (two) times daily.       warfarin (COUMADIN) 7.5 MG tablet Take 2 tablets (15 mg total) by mouth one time only at 6 PM. 2 tablet 0     Drug Regimen Review  Drug regimen was reviewed and remains appropriate with no significant issues identified   Home: Home Living Family/patient expects to be discharged to:: Private residence Living Arrangements: Spouse/significant other, Children Available Help at Discharge: Available 24 hours/day, Family Type of Home: House Home Access: Stairs to enter [employer] of Steps: 1 small step and a threshold to get into house, pt has been negotiating backwards w/ RW since L toe amputation Home Layout: Two level, Able to live on main level with bedroom/bathroom Alternate Level Stairs-Number of Steps: flight Bathroom Shower/Tub: Multimedia programmer: Standard  Lives With: Spouse   Functional History: Prior Function Level of Independence: Independent Comments: pt used to be a Academic librarian, independent with all ADL and no AD   Functional Status:  Mobility: Bed Mobility Overal bed mobility: Needs Assistance Bed Mobility: Supine to Sit Supine to sit: Mod assist General bed mobility comments: Pt given cues to roll and push up to sitting - using rail and HOB elevated.  pt reminded to use his right leg to assist - when he did this - it was much easier for him.  pt able to scoot forward and get his feet on the floor with min assist using chuck. Transfers Overall transfer level: Needs assistance Equipment used: None Transfers: Lateral/Scoot Transfers  Lateral/Scoot  Transfers: Min assist, +2 physical assistance General transfer comment: Pt transfering from higher bed to lower chair to the right side - scoot transfer with min assist of 2.  pt did well with optimal set up.  Pt became less responsive in chair and ended up needing +4 total assist to scoot back into the bed and lay down.  Nursing present. Ambulation/Gait General Gait Details: unable - did not try to stand today     ADL: ADL Overall ADL's : Needs assistance/impaired Eating/Feeding: Set up, Sitting Eating/Feeding Details (indicate cue type and reason): EOB Grooming: Set up, Supervision/safety, Sitting Grooming Details (indicate cue type and reason): EOB Upper Body Bathing: Supervision/ safety, Set up, Sitting Upper Body Bathing Details (indicate cue type and reason): EOB Lower Body Bathing: Maximal assistance, Bed level Upper Body Dressing : Supervision/safety, Set up, Sitting Upper Body Dressing Details (indicate cue type and reason): EOB Lower Body Dressing: Total assistance, Bed level Toilet Transfer: Minimal assistance, +2 for physical assistance Toilet Transfer Details (indicate cue type and reason): lateral scoot to right   Cognition: Cognition Overall Cognitive Status: Within Functional Limits for tasks assessed Orientation Level: Oriented X4 Cognition Arousal/Alertness: Awake/alert Behavior During Therapy: WFL for tasks assessed/performed Overall Cognitive Status: Within Functional Limits for tasks assessed General Comments: Very motivated and friendly during session; initally hesitant to work with therapy d/t pain but participated well.  Once in the chiar - pt became less responsive - delayed and inconsistent responses, not focused visually.  Pts  HR throughout treatment 54-74 and irregular.  pts BP 124/76 in bed, 106/60 in the chair and after 10 minutes down to 98/54.  pt total assisted back to bed.  Nursing present and rapid response called.  Pts blood sugar 406.  Left pt with 4  staff members working with him      Blood pressure 131/62, pulse 82, temperature 98.8 F (37.1 C), temperature source Oral, resp. rate 13, height 5\' 11"  (1.803 m), weight 102.5 kg, SpO2 99 %. Physical Exam  Nursing note and vitals reviewed. Constitutional: He is oriented to person, place, and time. He appears well-developed and well-nourished.  Uncomfortable d/t pain  HENT:  Head: Normocephalic and atraumatic.  Eyes: Pupils are equal, round, and reactive to light. EOM are normal.  Neck: Normal range of motion. No tracheal deviation present. No thyromegaly present.  Cardiovascular: Normal rate. An irregularly irregular rhythm present. Exam reveals no friction rub.  No murmur heard. +click  Respiratory: Effort normal. No respiratory distress. He has no wheezes.  GI: Soft. He exhibits no distension. There is no abdominal tenderness.  Musculoskeletal:        General: Tenderness and edema (L-AKA with moderate edema) present.  Neurological: He is alert and oriented to person, place, and time.  UE 5/6. RLE 4/5 prox to distal. Able to lift left leg against gravity. No sensory deficits  Skin: Skin is warm.  Incision with staples intact--small clot seen on medial aspect of wound. Wound continues to have moderate to large amount of serosanguinous drainage from lateral and medial aspect--soaked thorough dressing onto the bed. RLE intact without breakdown.   Psychiatric: He has a normal mood and affect. His behavior is normal. Judgment and thought content normal.         Lab Results Last 48 Hours        Results for orders placed or performed during the hospital encounter of 08/13/19 (from the past 48 hour(s))  Glucose, capillary     Status: Abnormal    Collection Time: 08/15/19 11:28 AM  Result Value Ref Range    Glucose-Capillary 253 (H) 70 - 99 mg/dL  Protime-INR     Status: Abnormal    Collection Time: 08/15/19 11:42 AM  Result Value Ref Range    Prothrombin Time 15.7 (H) 11.4 - 15.2  seconds    INR 1.3 (H) 0.8 - 1.2      Comment: (NOTE) INR goal varies based on device and disease states. Performed at [facility], [place] [address], [place], [place] [address]    Heparin level (unfractionated)     Status: Abnormal    Collection Time: 08/15/19  3:02 PM  Result Value Ref Range    Heparin Unfractionated 1.00 (H) 0.30 - 0.70 IU/mL      Comment: (NOTE) If heparin results are below expected values, and patient dosage has  been confirmed, suggest follow up testing of antithrombin III levels. Performed at [facility], [place] [address], [place], [place] [address]    Glucose, capillary     Status: Abnormal    Collection Time: 08/15/19  4:13 PM  Result Value Ref Range    Glucose-Capillary 336 (H) 70 - 99 mg/dL  Glucose, capillary     Status: Abnormal    Collection Time: 08/15/19  8:42 PM  Result Value Ref Range    Glucose-Capillary 318 (H) 70 - 99 mg/dL    Comment 1 Notify RN      Comment 2 Document in Chart  Heparin level (unfractionated)     Status: None    Collection Time: 08/15/19 11:31 PM  Result Value Ref Range    Heparin Unfractionated 0.61 0.30 - 0.70 IU/mL      Comment: (NOTE) If heparin results are below expected values, and patient dosage has  been confirmed, suggest follow up testing of antithrombin III levels. Performed at [facility], [place] [address], [place], [place] [address]    Heparin level (unfractionated)     Status: None    Collection Time: 08/16/19  2:31 AM  Result Value Ref Range    Heparin Unfractionated 0.69 0.30 - 0.70 IU/mL      Comment: (NOTE) If heparin results are below expected values, and patient dosage has  been confirmed, suggest follow up testing of antithrombin III levels. Performed at [facility], [place] [address] [place], [place] [address]    Protime-INR     Status: None    Collection Time: 08/16/19  2:31 AM  Result Value Ref Range    Prothrombin Time 15.2 11.4 - 15.2 seconds    INR 1.2 0.8 - 1.2        Comment: (NOTE) INR goal varies based on device and disease states. Performed at [facility], [place] [address], [place], [place] [address]    CBC     Status: Abnormal    Collection Time: 08/16/19  2:31 AM  Result Value Ref Range    WBC 9.4 4.0 - 10.5 K/uL    RBC 3.36 (L) 4.22 - 5.81 MIL/uL    Hemoglobin 8.8 (L) 13.0 - 17.0 g/dL    HCT 27.8 (L) 39.0 - 52.0 %    MCV 82.7 80.0 - 100.0 fL    MCH 26.2 26.0 - 34.0 pg    MCHC 31.7 30.0 - 36.0 g/dL    RDW 18.3 (H) 11.5 - 15.5 %    Platelets 316 150 - 400 K/uL    nRBC 0.0 0.0 - 0.2 %      Comment: Performed at [facility], [place] [address], [place],  [address]  Glucose, capillary     Status: Abnormal    Collection Time: 08/16/19  6:16 AM  Result Value Ref Range    Glucose-Capillary 271 (H) 70 - 99 mg/dL    Comment 1 Notify RN      Comment 2 Document in Chart    Glucose, capillary     Status: Abnormal    Collection Time: 08/16/19 11:07 AM  Result Value Ref Range    Glucose-Capillary 263 (H) 70 - 99 mg/dL  Heparin level (unfractionated)     Status: None    Collection Time: 08/16/19  2:46 PM  Result Value Ref Range    Heparin Unfractionated 0.63 0.30 - 0.70 IU/mL      Comment: (NOTE) If heparin results are below expected values, and patient dosage has  been confirmed, suggest follow up testing of antithrombin III levels. Performed at [facility], [place] [address], [place], [place] [address]    Glucose, capillary     Status: Abnormal    Collection Time: 08/16/19  5:13 PM  Result Value Ref Range    Glucose-Capillary 263 (H) 70 - 99 mg/dL  Glucose, capillary     Status: Abnormal    Collection Time: 08/16/19  8:48 PM  Result Value Ref Range    Glucose-Capillary 263 (H) 70 - 99 mg/dL    Comment 1 Document in Chart  Protime-INR     Status: Abnormal    Collection Time: 08/17/19  5:55 AM  Result Value Ref Range    Prothrombin Time 16.5 (H) 11.4 - 15.2 seconds    INR 1.4 (H) 0.8 - 1.2      Comment:  (NOTE) INR goal varies based on device and disease states. Performed at [facility], [place] [address], [place], [place] [address]    CBC     Status: Abnormal    Collection Time: 08/17/19  5:55 AM  Result Value Ref Range    WBC 11.1 (H) 4.0 - 10.5 K/uL    RBC 3.27 (L) 4.22 - 5.81 MIL/uL    Hemoglobin 8.7 (L) 13.0 - 17.0 g/dL    HCT 27.8 (L) 39.0 - 52.0 %    MCV 85.0 80.0 - 100.0 fL    MCH 26.6 26.0 - 34.0 pg    MCHC 31.3 30.0 - 36.0 g/dL    RDW 18.3 (H) 11.5 - 15.5 %    Platelets 356 150 - 400 K/uL    nRBC 0.0 0.0 - 0.2 %      Comment: Performed at [facility], [place] [address], [place], [place] [address]  Heparin level (unfractionated)     Status: None    Collection Time: 08/17/19  5:55 AM  Result Value Ref Range    Heparin Unfractionated 0.40 0.30 - 0.70 IU/mL      Comment: (NOTE) If heparin results are below expected values, and patient dosage has  been confirmed, suggest follow up testing of antithrombin III levels. Performed at [facility], [place] [address], [place], [place] [address]    Glucose, capillary     Status: Abnormal    Collection Time: 08/17/19  6:34 AM  Result Value Ref Range    Glucose-Capillary 292 (H) 70 - 99 mg/dL    Comment 1 Document in Chart       Imaging Results (Last 48 hours)  No results found.           Medical Problem List and Plan: 1.  Fxnl and mobility deficits secondary to PAD and left AKA             -admit to inpatient rehab 2.  Mechanical AVR/Antithrombotics: -DVT/anticoagulation:  Pharmaceutical: Coumadin and Heparin till INR> 2.5.             -antiplatelet therapy:  N/A 3. Pain Management: Still has a lot of pain--phantom as well as surgical post op pain and ongoing issues with muscle spasms.  Schedule low dose flexeril tid to help with spasms.   -May need neurontin titrate upwarrds.              -consider scheduling oxycodone in AM prior to therapies 4. Mood: LCSW to follow for evaluation and support.               -antipsychotic agents: N/A 5. Neuropsych: This patient is capable of making decisions on his own behalf. 6. Skin/Wound Care: Monitor wound daily.             -Increase dressing changes to bid due to amount of serosanguinous drainage that is ongoing.  7. Fluids/Electrolytes/Nutrition: Monitor I/O. Check lytes in am.  8. T2DM: Monitor BS ac/hs. Continue Lantus 40 mg bid with Victoza--resume amaryl. Continue to hold  9. CAD/CAF: Monitor for presyncope/syncope as back on Entresto.  10 Chronic systolic CHF: Heart healthy diet and monitor for signs of overload. Check weight daily. On  Entresto, Demadex, metoprolol and fenofibrate--wife to bring in [place].  11. Urinary retention: Reports voiding without difficulty. Will order PVRs X 3 to monitor. 12. Constipation: Will start bowel program as this has been an issues since first surgery.  13. Hyponatremia: Recheck labs in am.  14. Acute on chronic anemia: H/H is stable. Rise in WBC noted--monitor for sign of infecton.  15. OSA: CPAP when sleeping with 2 L bleed in.  16. Acute on chronic renal failure:Trending back to baseline? Monitor for recurrent presyncope.           Bary Leriche, PA-C 08/17/2019  I have personally performed a face to face diagnostic evaluation of this patient and formulated the key components of the plan.  Additionally, I have personally reviewed laboratory data, imaging studies, as well as relevant notes and concur with the physician assistant's documentation above.  Meredith Staggers, MD, [facility]rown

## 2019-08-17 NOTE — TOC Transition Note (Signed)
Transition of Care [facility]) - CM/SW Discharge Note Marvetta Gibbons RN, BSN Transitions of Care Unit 4E- RN Case Manager [phone]   Patient Details  Name: Joseph Hoffman MRN: [id number] Date of Birth: 08-Jan-1951  Transition of Care [facility]) CM/SW Contact:  Dawayne Patricia, RN Phone Number: 08/17/2019, 12:06 PM   Clinical Narrative:    Pt s/p AKA - CIR consulted and has a bed to offer today for INPT rehab. Pt has been cleared for admission to CIR later today.    Final next level of care: IP Rehab Facility Barriers to Discharge: No Barriers Identified   Patient Goals and CMS Choice Patient states their goals for this hospitalization and ongoing recovery are:: rehab      Discharge Placement  INPT rehab                                                    Social Determinants of Health (SDOH) Interventions     Readmission Risk Interventions No flowsheet data found.

## 2019-08-17 NOTE — PMR Pre-admission (Signed)
PMR Admission Coordinator Pre-Admission Assessment  Patient: Joseph Hoffman is an 68 y.o., male MRN: [id number] DOB: Apr 08, 1951 Height: 5' 11"  (180.3 cm) Weight: 102.5 kg  Insurance Information HMO:     PPO:      PCP:      IPA:      80/20:      OTHER:  PRIMARY: Medicare A and B Policy#: [id number]        Subscriber: patient CM Name:       Phone#:      Fax#:  Pre-Cert#: verified Civil engineer, contracting:  Benefits:  Phone #:      Name:  Eff. Date: 04/02/14 for A and B     Deduct: $1408      Out of Pocket Max: n/a      Life Max: n/a CIR: 100%      SNF: 20 full days  Outpatient:      Co-Pay:  Home Health:       Co-Pay:  DME:      Co-Pay:  Providers:  SECONDARY:       Policy#:       Subscriber:  CM Name:       Phone#:      Fax#:  Pre-Cert#:       Employer:  Benefits:  Phone #:      Name:  Eff. Date:      Deduct:       Out of Pocket Max:       Life Max:  CIR:       SNF: Outpatient:      Co-Pay:  Home Health:       Co-Pay:  DME:      Co-Pay:   Medicaid Application Date:       Case Manager:  Disability Application Date:       Case Worker:   The "Data Collection Information Summary" for patients in Inpatient Rehabilitation Facilities with attached "Privacy Act [place] Records" was provided and verbally reviewed with: Patient  Emergency Contact Information Contact Information    Name Relation Home Work Mobile   [place] Spouse [phone]  [phone]   [place], Wuertz [phone]        Current Medical History  Patient Admitting Diagnosis: L AKA  History of Present Illness: Joseph ESQUIVIAS is a 68 year old male with history of HTN, T2DM, mechanical AVR(leaking)- on coumadin, CAD--pending CABG, systolic CHF, CKD, OSA--CPAP with 2 L oxygen, left great toe amputation 07/03/19 with poor healing and purulent drainage. He was admitted on 07/29/19 with N/V, diarrhea and left foot infection. He was started on IV antibiotics and transitioned to IV heparin. he underwent  L-BKA on 07/31/19 by Dr. Carlis Abbott. Post op with lethargy, hypotension,and confusion felt to be secondary to morphine and/or oxycodone, discontinuedas well as steady worsening of renal status. Entersto discontinued,coumadin resumed and stump sock ordered from [employer]. Therapy evaluations completed andpatient limited by BKA as well neuropathy. Pt was transferred to CIR on 9/2 and participating well in therapies, but limited by ongoing pain and non-healing of BKA.  Vascular was consulted and felt pt would benefit from revision to AKA, which occurred on 9/10.  Postop pt recommended for CIR and will return on 9/14.      Patient's medical record from [facility] has been reviewed by the rehabilitation admission coordinator and physician.  Past Medical History  Past Medical History:  Diagnosis Date  . Anxiety   . Aortic stenosis   .  Arthritis   . Asthma   . Asymptomatic LV dysfunction, EF 45%, normal coronary arteries on cardiac cath 09/18/12 09/30/2012  . CHF (congestive heart failure) ([place])   . Chronic anticoagulation, on Xarelto prior to admit 09/30/2012  . DM (diabetes mellitus) ([place]) 09/30/2012  . Hepatitis B 2003  . Hypertension   . Myocardial infarction ([place])   . Permanent atrial fibrillation, since 1994 09/30/2012   stress test 02/08/12- normal study, no significant ischemia  . S/P AVR (aortic valve replacement), 09/30/2012   a. s/p mechcanical AVR in 09/2012 (on Coumadin)  . Sleep apnea    uses CPAP    Family History   family history includes Diabetes in his father; Heart attack (age of onset: 36) in his brother; Hyperlipidemia (age of onset: 70) in his brother; Hypertension in his mother.  Prior Rehab/Hospitalizations Has the patient had prior rehab or hospitalizations prior to admission? Yes  Has the patient had major surgery during 100 days prior to admission? Yes   Current Medications  Current Facility-Administered Medications:  .  acetaminophen (TYLENOL) tablet  325-650 mg, 325-650 mg, Oral, Q4H PRN **OR** acetaminophen (TYLENOL) suppository 325-650 mg, 325-650 mg, Rectal, Q4H PRN, Rhyne, Samantha J, PA-C .  albuterol (PROVENTIL) (2.5 MG/3ML) 0.083% nebulizer solution 2.5 mg, 2.5 mg, Inhalation, Q6H PRN, Rhyne, Samantha J, PA-C .  alum & mag hydroxide-simeth (MAALOX/MYLANTA) 200-200-20 MG/5ML suspension 15-30 mL, 15-30 mL, Oral, Q2H PRN, Rhyne, Samantha J, PA-C .  bisacodyl (DULCOLAX) suppository 10 mg, 10 mg, Rectal, Daily PRN, Rhyne, Samantha J, PA-C .  cholecalciferol (VITAMIN D3) tablet 1,000 Units, 1,000 Units, Oral, Q breakfast, Rhyne, Samantha J, PA-C, 1,000 Units at 08/17/19 0856 .  digoxin (LANOXIN) tablet 0.125 mg, 0.125 mg, Oral, Daily, Rhyne, Samantha J, PA-C, 0.125 mg at 08/17/19 0858 .  docusate sodium (COLACE) capsule 100 mg, 100 mg, Oral, Daily, Rhyne, Samantha J, PA-C, 100 mg at 08/17/19 0857 .  escitalopram (LEXAPRO) tablet 10 mg, 10 mg, Oral, QHS, Rhyne, Samantha J, PA-C, 10 mg at 08/16/19 2113 .  ezetimibe (ZETIA) tablet 10 mg, 10 mg, Oral, Daily, Rhyne, Samantha J, PA-C, 10 mg at 08/17/19 0857 .  fenofibrate tablet 160 mg, 160 mg, Oral, Q breakfast, Rhyne, Samantha J, PA-C, 160 mg at 08/17/19 0858 .  gabapentin (NEURONTIN) capsule 300 mg, 300 mg, Oral, TID, Rhyne, Samantha J, PA-C, 300 mg at 08/17/19 0857 .  guaiFENesin-dextromethorphan (ROBITUSSIN DM) 100-10 MG/5ML syrup 15 mL, 15 mL, Oral, Q4H PRN, Rhyne, Samantha J, PA-C .  heparin ADULT infusion 100 units/mL (25000 units/264m sodium chloride 0.45%), 1,500 Units/hr, Intravenous, Continuous, NWerner Lean RPH, Last Rate: 15 mL/hr at 08/17/19 0403, 1,500 Units/hr at 08/17/19 0403 .  hydrALAZINE (APRESOLINE) injection 5 mg, 5 mg, Intravenous, Q20 Min PRN, Rhyne, Samantha J, PA-C .  insulin aspart (novoLOG) injection 0-20 Units, 0-20 Units, Subcutaneous, TID WC, CWaynetta Sandy MD, 11 Units at 08/17/19 [phone].  insulin glargine (LANTUS) injection 40 Units, 40 Units,  Subcutaneous, BID, CWaynetta Sandy MD, 40 Units at 08/16/19 2114 .  labetalol (NORMODYNE) injection 10 mg, 10 mg, Intravenous, Q10 min PRN, Rhyne, Samantha J, PA-C .  liraglutide (VICTOZA) SOPN 1.8 mg, 1.8 mg, Subcutaneous, QHS, Clark, CGwenyth Allegra MD .  magnesium sulfate IVPB 2 g 50 mL, 2 g, Intravenous, Daily PRN, Rhyne, Samantha J, PA-C .  metoprolol succinate (TOPROL-XL) 24 hr tablet 25 mg, 25 mg, Oral, BID, Rhyne, Samantha J, PA-C, 25 mg at 08/17/19 0856 .  metoprolol tartrate (LOPRESSOR) injection 2-5 mg,  2-5 mg, Intravenous, Q2H PRN, Rhyne, Samantha J, PA-C .  mometasone-formoterol (DULERA) 200-5 MCG/ACT inhaler 2 puff, 2 puff, Inhalation, BID, Rhyne, Samantha J, PA-C, 2 puff at 08/17/19 0807 .  morphine 2 MG/ML injection 1-2 mg, 1-2 mg, Intravenous, Q2H PRN, Dagoberto Ligas, PA-C, 2 mg at 08/17/19 0858 .  nitroGLYCERIN (NITROSTAT) SL tablet 0.4 mg, 0.4 mg, Sublingual, Q5 Min x 3 PRN, Rhyne, Samantha J, PA-C .  ondansetron (ZOFRAN) injection 4 mg, 4 mg, Intravenous, Q6H PRN, Rhyne, Samantha J, PA-C .  oxyCODONE-acetaminophen (PERCOCET/ROXICET) 5-325 MG per tablet 1-2 tablet, 1-2 tablet, Oral, Q4H PRN, 2 tablet at 08/17/19 0529 **AND** oxyCODONE (Oxy IR/ROXICODONE) immediate release tablet 5-10 mg, 5-10 mg, Oral, Q4H PRN, Marty Heck, MD, 5 mg at 08/17/19 0529 .  pantoprazole (PROTONIX) EC tablet 40 mg, 40 mg, Oral, Daily, Rhyne, Samantha J, PA-C, 40 mg at 08/17/19 0855 .  phenol (CHLORASEPTIC) mouth spray 1 spray, 1 spray, Mouth/Throat, PRN, Rhyne, Samantha J, PA-C .  polyethylene glycol (MIRALAX / GLYCOLAX) packet 17 g, 17 g, Oral, Daily PRN, Rhyne, Samantha J, PA-C .  potassium chloride SA (K-DUR) CR tablet 20-40 mEq, 20-40 mEq, Oral, Daily PRN, Rhyne, Samantha J, PA-C .  saccharomyces boulardii (FLORASTOR) capsule 250 mg, 250 mg, Oral, 3 times weekly, Rhyne, Samantha J, PA-C, 250 mg at 08/17/19 0856 .  sacubitril-valsartan (ENTRESTO) 24-26 mg per tablet, 1 tablet,  Oral, BID, Rhyne, Hulen Shouts, PA-C, 1 tablet at 08/16/19 2113 .  tamsulosin (FLOMAX) capsule 0.4 mg, 0.4 mg, Oral, QPC supper, Rhyne, Samantha J, PA-C, 0.4 mg at 08/16/19 1726 .  torsemide (DEMADEX) tablet 40 mg, 40 mg, Oral, Daily, 40 mg at 08/17/19 0856 **AND** torsemide (DEMADEX) tablet 20 mg, 20 mg, Oral, QPM, Marty Heck, MD, 20 mg at 08/16/19 1726 .  vitamin C (ASCORBIC ACID) tablet 500 mg, 500 mg, Oral, BID, Rhyne, Samantha J, PA-C, 500 mg at 08/16/19 2112 .  warfarin (COUMADIN) tablet 10 mg, 10 mg, Oral, ONCE-1800, Karren Cobble, RPH .  Warfarin - Pharmacist Dosing Inpatient, , Does not apply, q1800, Werner Lean, RPH  Patients Current Diet:  Diet Order            Diet Carb Modified Fluid consistency: Thin; Room service appropriate? Yes with Assist  Diet effective now              Precautions / Restrictions Precautions Precautions: Fall Precaution Comments: monitor HR, sats and BP Restrictions Weight Bearing Restrictions: Yes LLE Weight Bearing: Non weight bearing Other Position/Activity Restrictions: L AKA - in ace wrap.  no drainage seen through wrap   Has the patient had 2 or more falls or a fall with injury in the past year? No  Prior Activity Level Community (5-7x/wk): retired, driving, used to Psychologist, occupational  Prior Functional Level Self Care: Did the patient need help bathing, dressing, using the toilet or eating? Independent  Indoor Mobility: Did the patient need assistance with walking from room to room (with or without device)? Independent  Stairs: Did the patient need assistance with internal or external stairs (with or without device)? Independent  Functional Cognition: Did the patient need help planning regular tasks such as shopping or remembering to take medications? Independent  Home Assistive Devices / Equipment Home Assistive Devices/Equipment: Eyeglasses, Shower chair without back, Environmental consultant (specify type)  Prior Device  Use: Indicate devices/aids used by the patient prior to current illness, exacerbation or injury? None of the above  Current Functional Level Cognition  Overall  Cognitive Status: Within Functional Limits for tasks assessed Orientation Level: Oriented X4 General Comments: Very motivated and friendly during session; initally hesitant to work with therapy d/t pain but participated well.  Once in the chiar - pt became less responsive - delayed and inconsistent responses, not focused visually.  Pts HR throughout treatment 54-74 and irregular.  pts BP 124/76 in bed, 106/60 in the chair and after 10 minutes down to 98/54.  pt total assisted back to bed.  Nursing present and rapid response called.  Pts blood sugar 406.  Left pt with 4 staff members working with him    Extremity Assessment (includes Sensation/Coordination)  Upper Extremity Assessment: Overall WFL for tasks assessed  LLE Deficits / Details: pt complaining of left hamstring pain.  Pt supine and lifted left leg (instructed not to put hands near end of residual limb) and pt stretched out hamstring and said it helped the pain.  pt knows to avoid all strenous movement and to avoid any pressure at end of residual limb    ADLs  Overall ADL's : Needs assistance/impaired Eating/Feeding: Set up, Sitting Eating/Feeding Details (indicate cue type and reason): EOB Grooming: Set up, Supervision/safety, Sitting Grooming Details (indicate cue type and reason): EOB Upper Body Bathing: Supervision/ safety, Set up, Sitting Upper Body Bathing Details (indicate cue type and reason): EOB Lower Body Bathing: Maximal assistance, Bed level Upper Body Dressing : Supervision/safety, Set up, Sitting Upper Body Dressing Details (indicate cue type and reason): EOB Lower Body Dressing: Total assistance, Bed level Toilet Transfer: Minimal assistance, +2 for physical assistance Toilet Transfer Details (indicate cue type and reason): lateral scoot to right     Mobility  Overal bed mobility: Needs Assistance Bed Mobility: Supine to Sit Supine to sit: Mod assist General bed mobility comments: Pt given cues to roll and push up to sitting - using rail and HOB elevated.  pt reminded to use his right leg to assist - when he did this - it was much easier for him.  pt able to scoot forward and get his feet on the floor with min assist using chuck.    Transfers  Overall transfer level: Needs assistance Equipment used: None Transfers: Lateral/Scoot Transfers  Lateral/Scoot Transfers: Min assist, +2 physical assistance General transfer comment: Pt transfering from higher bed to lower chair to the right side - scoot transfer with min assist of 2.  pt did well with optimal set up.  Pt became less responsive in chair and ended up needing +4 total assist to scoot back into the bed and lay down.  Nursing present.    Ambulation / Gait / Stairs / Wheelchair Mobility  Ambulation/Gait General Gait Details: unable - did not try to stand today    Posture / Balance Dynamic Sitting Balance Sitting balance - Comments: pt able to sit EOB with RLe on the floor.  Occasional dizziness Balance Overall balance assessment: Needs assistance Sitting-balance support: (foot supported) Sitting balance-Leahy Scale: Good Sitting balance - Comments: pt able to sit EOB with RLe on the floor.  Occasional dizziness Standing balance-Leahy Scale: Zero Standing balance comment: not tested today    Special needs/care consideration BiPAP/CPAP CPAP at night CPM no Continuous Drip IV no Dialysis no        Days n/a Life Vest no Oxygen no Special Bed no Trach Size no Wound Vac (area) no      Location n/a Skin incision to LLE  Bowel mgmt: continent, last BM 9-11 Bladder mgmt: continent Diabetic mgmt: yes Behavioral consideration no Chemo/radiation no   Previous Home Environment (from acute therapy documentation) Living Arrangements: Spouse/significant other,  Children  Lives With: Spouse Available Help at Discharge: Available 24 hours/day, Family Type of Home: House Home Layout: Two level, Able to live on main level with bedroom/bathroom Alternate Level Stairs-Number of Steps: flight Home Access: Stairs to enter Entrance Stairs-Number of Steps: 1 small step and a threshold to get into house, pt has been negotiating backwards w/ RW since L toe amputation Bathroom Shower/Tub: Multimedia programmer: Standard Home Care Services: Yes Type of Home Care Services: Home RN, [place] (if known): [place]  Discharge Living Setting Plans for Discharge Living Setting: Patient's home Type of Home at Discharge: Apartment Discharge Home Layout: Two level, Able to live on main level with bedroom/bathroom Discharge Home Access: Stairs to enter Entrance Stairs-Rails: None Entrance Stairs-Number of Steps: 3 Discharge Bathroom Shower/Tub: Tub/shower unit Discharge Bathroom Toilet: Standard Discharge Bathroom Accessibility: Yes How Accessible: Accessible via walker Does the patient have any problems obtaining your medications?: No  Social/Family/Support Systems Patient Roles: Spouse, Parent Anticipated Caregiver: self--wife has her own health issues Anticipated Ambulance person Information: [name]: [phone] Ability/Limitations of Caregiver: wife can do supervision only due to health issues and son lives up stairs and works Building control surveyor Availability: 24/7 Discharge Plan Discussed with Primary Caregiver: Yes Is Caregiver In Agreement with Plan?: Yes Does Caregiver/Family have Issues with Lodging/Transportation while Pt is in Rehab?: No  Goals/Additional Needs Patient/Family Goal for Rehab: PT/OT supervision to mod I Expected length of stay: 18-21 days Dietary Needs: carb modified/thin Pt/Family Agrees to Admission and willing to participate: Yes Program Orientation Provided & Reviewed with Pt/Caregiver Including Roles  &  Responsibilities: Yes  Decrease burden of Care through IP rehab admission: n/a  Possible need for SNF placement upon discharge: not anticipated  Patient Condition: I have reviewed medical records from [facility], spoken with CM, and patient. I met with patient at the bedside for inpatient rehabilitation assessment.  Patient will benefit from ongoing PT and OT, can actively participate in 3 hours of therapy a day 5 days of the week, and can make measurable gains during the admission.  Patient will also benefit from the coordinated team approach during an Inpatient Acute Rehabilitation admission.  The patient will receive intensive therapy as well as Rehabilitation physician, nursing, social worker, and care management interventions.  Due to safety, skin/wound care, disease management, medication administration, pain management and patient education the patient requires 24 hour a day rehabilitation nursing.  The patient is currently min +2 with mobility and basic ADLs.  Discharge setting and therapy post discharge at home with home health is anticipated.  Patient has agreed to participate in the Acute Inpatient Rehabilitation Program and will admit today.  Preadmission Screen Completed By:  Michel Santee, 08/17/2019 9:47 AM ______________________________________________________________________   Discussed status with Dr. Naaman Plummer on 08/17/19  at 9:52 AM  and received approval for admission today.  Admission Coordinator:  Michel Santee, PT, DPT 9:52 AM Sudie Grumbling 08/17/19    Assessment/Plan: Diagnosis: left AKA 1. Does the need for close, 24 hr/day Medical supervision in concert with the patient's rehab needs make it unreasonable for this patient to be served in a less intensive setting? Yes 2. Co-Morbidities requiring supervision/potential complications: htn, dm,avr, ckd 3. Due to bladder management, bowel management, safety, skin/wound care, disease management, medication administration, pain  management and  patient education, does the patient require 24 hr/day rehab nursing? Yes 4. Does the patient require coordinated care of a physician, rehab nurse, PT (1-2 hrs/day, 5 days/week) and OT (1-2 hrs/day, 5 days/week) to address physical and functional deficits in the context of the above medical diagnosis(es)? Yes Addressing deficits in the following areas: balance, endurance, locomotion, strength, transferring, bowel/bladder control, bathing, dressing, feeding, grooming, toileting and psychosocial support 5. Can the patient actively participate in an intensive therapy program of at least 3 hrs of therapy 5 days a week? Yes 6. The potential for patient to make measurable gains while on inpatient rehab is excellent 7. Anticipated functional outcomes upon discharge from inpatients are: modified independent and supervision PT, modified independent and supervision OT, n/a SLP 8. Estimated rehab length of stay to reach the above functional goals is: 18-21 days 9. Anticipated D/C setting: Home 10. Anticipated post D/C treatments: [place] therapy 11. Overall Rehab/Functional Prognosis: excellent  MD Signature: Meredith Staggers, MD, [facility] 08/17/2019

## 2019-08-17 NOTE — Progress Notes (Signed)
Patient transferred to [phone] along with his personal belongings, heparin drip infusing @15ml /hr. Unable to reach patient's wife on the phone.

## 2019-08-17 NOTE — Progress Notes (Addendum)
Admit the pt. To rehab.The unit routine were repeated to the pt. Safety plan and fall prevention plan were review.

## 2019-08-17 NOTE — Plan of Care (Signed)

## 2019-08-17 NOTE — Progress Notes (Addendum)
Inpatient Diabetes Program Recommendations  AACE/ADA: New Consensus Statement on Inpatient Glycemic Control (2015)  Target Ranges:  Prepandial:   less than 140 mg/dL      Peak postprandial:   less than 180 mg/dL (1-2 hours)      Critically ill patients:  140 - 180 mg/dL   Lab Results  Component Value Date   GLUCAP 292 (H) 08/17/2019   HGBA1C 8.7 (H) 05/13/2019    Review of Glycemic Control Results for Joseph Hoffman, Joseph Hoffman (MRN [id number]) as of 08/17/2019 11:35  Ref. Range 08/16/2019 06:16 08/16/2019 11:07 08/16/2019 17:13 08/16/2019 20:48 08/17/2019 06:34 08/17/2019 11:32  Glucose-Capillary Latest Ref Range: 70 - 99 mg/dL 271 (H) 263 (H) 263 (H) 263 (H) 292 (H) 202 (H)   Diabetes history: DM 2 Outpatient Diabetes medications: Lantus 15-40 units qam, 30-40 units qpm, Victoza 1.8 mg Daily  Current orders for Inpatient glycemic control:  Lantus 40 units bid Novolog 0-20 units tid Victoza 1.8 mg Daily  Inpatient Diabetes Program Recommendations:    Pt received decadron 10 mg day of surgery  Consider increasing Lantus to 45 units bid  Thanks,  Tama Headings RN, MSN, BC-ADM Inpatient Diabetes Coordinator Team Pager [phone] (8a-5p)

## 2019-08-17 NOTE — Progress Notes (Signed)
Meredith Staggers, MD  Physician  Physical Medicine and Rehabilitation  PMR Pre-admission  Signed  Date of Service:  08/17/2019 9:47 AM      Related encounter: Admission (Current) from 08/13/2019 in [facility] 4E CV SURGICAL PROGRESSIVE CARE      Signed         Show:Clear all _0 Manual_1 Template_2 Copied  Added by: _3 Meredith Staggers, MD_4 Michel Santee, PT  _5 Hover for details PMR Admission Coordinator Pre-Admission Assessment  Patient: Joseph Hoffman is an 68 y.o., male MRN: [id number] DOB: May 14, 1951 Height: _6  (180.3 cm) Weight: 102.5 kg  Insurance Information HMO:     PPO:      PCP:      IPA:      80/20:      OTHER:  PRIMARY: Medicare A and B Policy#: [id number]        Subscriber: patient CM Name:       Phone#:      Fax#:  Pre-Cert#: verified Civil engineer, contracting:  Benefits:  Phone #:      Name:  Eff. Date: 04/02/14 for A and B     Deduct: $1408      Out of Pocket Max: n/a      Life Max: n/a CIR: 100%      SNF: 20 full days  Outpatient:      Co-Pay:  Home Health:       Co-Pay:  DME:      Co-Pay:  Providers:  SECONDARY:       Policy#:       Subscriber:  CM Name:       Phone#:      Fax#:  Pre-Cert#:       Employer:  Benefits:  Phone #:      Name:  Eff. Date:      Deduct:       Out of Pocket Max:       Life Max:  CIR:       SNF: Outpatient:      Co-Pay:  Home Health:       Co-Pay:  DME:      Co-Pay:   Medicaid Application Date:       Case Manager:  Disability Application Date:       Case Worker:   The Data Collection Information Summary for patients in Inpatient Rehabilitation Facilities with attached Privacy Act [place] Records was provided and verbally reviewed with: Patient  Emergency Contact Information         Contact Information    Name Relation Home Work Mobile   [name] Spouse [phone]  [phone]   [name], [name] [phone]        Current Medical History  Patient Admitting Diagnosis: L  AKA  History of Present Illness: Joseph Hoffman is a 68 year old male with history of HTN, T2DM, mechanical AVR(leaking)- on coumadin, CAD--pending CABG, systolic CHF, CKD, OSA--CPAP with 2 L oxygen, left great toe amputation 07/03/19 with poor healing and purulent drainage. He was admitted on 07/29/19 with N/V, diarrhea and left foot infection. He was started on IV antibiotics and transitioned to IV heparin. he underwent L-BKA on 07/31/19 by Dr. Carlis Abbott. Post op with lethargy, hypotension,and confusion felt to be secondary to morphine and/or oxycodone, discontinuedas well as steady worsening of renal status. Entersto discontinued,coumadin resumed and stump sock ordered from [employer]. Therapy evaluations completed andpatient limited by BKA as well neuropathy. Pt was transferred to CIR on 9/2 and participating well in  therapies, but limited by ongoing pain and non-healing of BKA.  Vascular was consulted and felt pt would benefit from revision to AKA, which occurred on 9/10.  Postop pt recommended for CIR and will return on 9/14.      Patient's medical record from [facility] has been reviewed by the rehabilitation admission coordinator and physician.  Past Medical History      Past Medical History:  Diagnosis Date   Anxiety    Aortic stenosis    Arthritis    Asthma    Asymptomatic LV dysfunction, EF 45%, normal coronary arteries on cardiac cath 09/18/12 09/30/2012   CHF (congestive heart failure) (HCC)    Chronic anticoagulation, on Xarelto prior to admit 09/30/2012   DM (diabetes mellitus) ([place]) 09/30/2012   Hepatitis B 2003   Hypertension    Myocardial infarction [facility])    Permanent atrial fibrillation, since 1994 09/30/2012   stress test 02/08/12- normal study, no significant ischemia   S/P AVR (aortic valve replacement), 09/30/2012   a. s/p mechcanical AVR in 09/2012 (on Coumadin)   Sleep apnea    uses CPAP    Family History   family history  includes Diabetes in his father; Heart attack (age of onset: 43) in his brother; Hyperlipidemia (age of onset: 65) in his brother; Hypertension in his mother.  Prior Rehab/Hospitalizations Has the patient had prior rehab or hospitalizations prior to admission? Yes  Has the patient had major surgery during 100 days prior to admission? Yes             Current Medications  Current Facility-Administered Medications:    acetaminophen (TYLENOL) tablet 325-650 mg, 325-650 mg, Oral, Q4H PRN **OR** acetaminophen (TYLENOL) suppository 325-650 mg, 325-650 mg, Rectal, Q4H PRN, Rhyne, Samantha J, PA-C   albuterol (PROVENTIL) (2.5 MG/3ML) 0.083% nebulizer solution 2.5 mg, 2.5 mg, Inhalation, Q6H PRN, Rhyne, Samantha J, PA-C   alum & mag hydroxide-simeth (MAALOX/MYLANTA) 200-200-20 MG/5ML suspension 15-30 mL, 15-30 mL, Oral, Q2H PRN, Rhyne, Samantha J, PA-C   bisacodyl (DULCOLAX) suppository 10 mg, 10 mg, Rectal, Daily PRN, Rhyne, Samantha J, PA-C   cholecalciferol (VITAMIN D3) tablet 1,000 Units, 1,000 Units, Oral, Q breakfast, Rhyne, Samantha J, PA-C, 1,000 Units at 08/17/19 0856   digoxin (LANOXIN) tablet 0.125 mg, 0.125 mg, Oral, Daily, Rhyne, Samantha J, PA-C, 0.125 mg at 08/17/19 0858   docusate sodium (COLACE) capsule 100 mg, 100 mg, Oral, Daily, Rhyne, Samantha J, PA-C, 100 mg at 08/17/19 0857   escitalopram (LEXAPRO) tablet 10 mg, 10 mg, Oral, QHS, Rhyne, Samantha J, PA-C, 10 mg at 08/16/19 2113   ezetimibe (ZETIA) tablet 10 mg, 10 mg, Oral, Daily, Rhyne, Samantha J, PA-C, 10 mg at 08/17/19 0857   fenofibrate tablet 160 mg, 160 mg, Oral, Q breakfast, Rhyne, Samantha J, PA-C, 160 mg at 08/17/19 0858   gabapentin (NEURONTIN) capsule 300 mg, 300 mg, Oral, TID, Rhyne, Samantha J, PA-C, 300 mg at 08/17/19 0857   guaiFENesin-dextromethorphan (ROBITUSSIN DM) 100-10 MG/5ML syrup 15 mL, 15 mL, Oral, Q4H PRN, Rhyne, Samantha J, PA-C   heparin ADULT infusion 100 units/mL (25000 units/238m  sodium chloride 0.45%), 1,500 Units/hr, Intravenous, Continuous, NWerner Lean RPH, Last Rate: 15 mL/hr at 08/17/19 0403, 1,500 Units/hr at 08/17/19 0403   hydrALAZINE (APRESOLINE) injection 5 mg, 5 mg, Intravenous, Q20 Min PRN, Rhyne, Samantha J, PA-C   insulin aspart (novoLOG) injection 0-20 Units, 0-20 Units, Subcutaneous, TID WC, CWaynetta Sandy MD, 11 Units at 08/17/19 0657   insulin glargine (LANTUS) injection  40 Units, 40 Units, Subcutaneous, BID, Waynetta Sandy, MD, 40 Units at 08/16/19 2114   labetalol (NORMODYNE) injection 10 mg, 10 mg, Intravenous, Q10 min PRN, Rhyne, Samantha J, PA-C   liraglutide (VICTOZA) SOPN 1.8 mg, 1.8 mg, Subcutaneous, QHS, Clark, Gwenyth Allegra, MD   magnesium sulfate IVPB 2 g 50 mL, 2 g, Intravenous, Daily PRN, Rhyne, Samantha J, PA-C   metoprolol succinate (TOPROL-XL) 24 hr tablet 25 mg, 25 mg, Oral, BID, Rhyne, Samantha J, PA-C, 25 mg at 08/17/19 0856   metoprolol tartrate (LOPRESSOR) injection 2-5 mg, 2-5 mg, Intravenous, Q2H PRN, Rhyne, Samantha J, PA-C   mometasone-formoterol (DULERA) 200-5 MCG/ACT inhaler 2 puff, 2 puff, Inhalation, BID, Rhyne, Samantha J, PA-C, 2 puff at 08/17/19 0807   morphine 2 MG/ML injection 1-2 mg, 1-2 mg, Intravenous, Q2H PRN, Dagoberto Ligas, PA-C, 2 mg at 08/17/19 0858   nitroGLYCERIN (NITROSTAT) SL tablet 0.4 mg, 0.4 mg, Sublingual, Q5 Min x 3 PRN, Rhyne, Samantha J, PA-C   ondansetron (ZOFRAN) injection 4 mg, 4 mg, Intravenous, Q6H PRN, Rhyne, Samantha J, PA-C   oxyCODONE-acetaminophen (PERCOCET/ROXICET) 5-325 MG per tablet 1-2 tablet, 1-2 tablet, Oral, Q4H PRN, 2 tablet at 08/17/19 0529 **AND** oxyCODONE (Oxy IR/ROXICODONE) immediate release tablet 5-10 mg, 5-10 mg, Oral, Q4H PRN, Marty Heck, MD, 5 mg at 08/17/19 0529   pantoprazole (PROTONIX) EC tablet 40 mg, 40 mg, Oral, Daily, Rhyne, Samantha J, PA-C, 40 mg at 08/17/19 0855   phenol (CHLORASEPTIC) mouth spray 1 spray, 1 spray,  Mouth/Throat, PRN, Rhyne, Samantha J, PA-C   polyethylene glycol (MIRALAX / GLYCOLAX) packet 17 g, 17 g, Oral, Daily PRN, Rhyne, Samantha J, PA-C   potassium chloride SA (K-DUR) CR tablet 20-40 mEq, 20-40 mEq, Oral, Daily PRN, Rhyne, Samantha J, PA-C   saccharomyces boulardii (FLORASTOR) capsule 250 mg, 250 mg, Oral, 3 times weekly, Rhyne, Samantha J, PA-C, 250 mg at 08/17/19 0856   sacubitril-valsartan (ENTRESTO) 24-26 mg per tablet, 1 tablet, Oral, BID, Rhyne, Samantha J, PA-C, 1 tablet at 08/16/19 2113   tamsulosin (FLOMAX) capsule 0.4 mg, 0.4 mg, Oral, QPC supper, Rhyne, Samantha J, PA-C, 0.4 mg at 08/16/19 1726   torsemide (DEMADEX) tablet 40 mg, 40 mg, Oral, Daily, 40 mg at 08/17/19 0856 **AND** torsemide (DEMADEX) tablet 20 mg, 20 mg, Oral, QPM, Marty Heck, MD, 20 mg at 08/16/19 1726   vitamin C (ASCORBIC ACID) tablet 500 mg, 500 mg, Oral, BID, Rhyne, Samantha J, PA-C, 500 mg at 08/16/19 2112   warfarin (COUMADIN) tablet 10 mg, 10 mg, Oral, ONCE-1800, Karren Cobble, RPH   Warfarin - Pharmacist Dosing Inpatient, , Does not apply, q1800, Hillery Aldo, Kushal D, RPH  Patients Current Diet:     Diet Order                  Diet Carb Modified Fluid consistency: Thin; Room service appropriate? Yes with Assist  Diet effective now               Precautions / Restrictions Precautions Precautions: Fall Precaution Comments: monitor HR, sats and BP Restrictions Weight Bearing Restrictions: Yes LLE Weight Bearing: Non weight bearing Other Position/Activity Restrictions: L AKA - in ace wrap.  no drainage seen through wrap   Has the patient had 2 or more falls or a fall with injury in the past year? No  Prior Activity Level Community (5-7x/wk): retired, driving, used to Psychologist, occupational  Prior Functional Level Self Care: Did the patient need help bathing, dressing, using the  toilet or eating? Independent  Indoor Mobility: Did the patient need  assistance with walking from room to room (with or without device)? Independent  Stairs: Did the patient need assistance with internal or external stairs (with or without device)? Independent  Functional Cognition: Did the patient need help planning regular tasks such as shopping or remembering to take medications? Independent  Home Assistive Devices / Equipment Home Assistive Devices/Equipment: Eyeglasses, Shower chair without back, Environmental consultant (specify type)  Prior Device Use: Indicate devices/aids used by the patient prior to current illness, exacerbation or injury? None of the above  Current Functional Level Cognition  Overall Cognitive Status: Within Functional Limits for tasks assessed Orientation Level: Oriented X4 General Comments: Very motivated and friendly during session; initally hesitant to work with therapy d/t pain but participated well.  Once in the chiar - pt became less responsive - delayed and inconsistent responses, not focused visually.  Pts HR throughout treatment 54-74 and irregular.  pts BP 124/76 in bed, 106/60 in the chair and after 10 minutes down to 98/54.  pt total assisted back to bed.  Nursing present and rapid response called.  Pts blood sugar 406.  Left pt with 4 staff members working with him    Extremity Assessment (includes Sensation/Coordination)  Upper Extremity Assessment: Overall WFL for tasks assessed  LLE Deficits / Details: pt complaining of left hamstring pain.  Pt supine and lifted left leg (instructed not to put hands near end of residual limb) and pt stretched out hamstring and said it helped the pain.  pt knows to avoid all strenous movement and to avoid any pressure at end of residual limb    ADLs  Overall ADL's : Needs assistance/impaired Eating/Feeding: Set up, Sitting Eating/Feeding Details (indicate cue type and reason): EOB Grooming: Set up, Supervision/safety, Sitting Grooming Details (indicate cue type and reason): EOB Upper Body  Bathing: Supervision/ safety, Set up, Sitting Upper Body Bathing Details (indicate cue type and reason): EOB Lower Body Bathing: Maximal assistance, Bed level Upper Body Dressing : Supervision/safety, Set up, Sitting Upper Body Dressing Details (indicate cue type and reason): EOB Lower Body Dressing: Total assistance, Bed level Toilet Transfer: Minimal assistance, +2 for physical assistance Toilet Transfer Details (indicate cue type and reason): lateral scoot to right    Mobility  Overal bed mobility: Needs Assistance Bed Mobility: Supine to Sit Supine to sit: Mod assist General bed mobility comments: Pt given cues to roll and push up to sitting - using rail and HOB elevated.  pt reminded to use his right leg to assist - when he did this - it was much easier for him.  pt able to scoot forward and get his feet on the floor with min assist using chuck.    Transfers  Overall transfer level: Needs assistance Equipment used: None Transfers: Lateral/Scoot Transfers  Lateral/Scoot Transfers: Min assist, +2 physical assistance General transfer comment: Pt transfering from higher bed to lower chair to the right side - scoot transfer with min assist of 2.  pt did well with optimal set up.  Pt became less responsive in chair and ended up needing +4 total assist to scoot back into the bed and lay down.  Nursing present.    Ambulation / Gait / Stairs / Wheelchair Mobility  Ambulation/Gait General Gait Details: unable - did not try to stand today    Posture / Balance Dynamic Sitting Balance Sitting balance - Comments: pt able to sit EOB with RLe on the floor.  Occasional dizziness Balance  Overall balance assessment: Needs assistance Sitting-balance support: (foot supported) Sitting balance-Leahy Scale: Good Sitting balance - Comments: pt able to sit EOB with RLe on the floor.  Occasional dizziness Standing balance-Leahy Scale: Zero Standing balance comment: not tested today    Special  needs/care consideration BiPAP/CPAP CPAP at night CPM no Continuous Drip IV no Dialysis no        Days n/a Life Vest no Oxygen no Special Bed no Trach Size no Wound Vac (area) no      Location n/a Skin incision to LLE                 Bowel mgmt: continent, last BM 9-11 Bladder mgmt: continent Diabetic mgmt: yes Behavioral consideration no Chemo/radiation no   Previous Home Environment (from acute therapy documentation) Living Arrangements: Spouse/significant other, Children  Lives With: Spouse Available Help at Discharge: Available 24 hours/day, Family Type of Home: House Home Layout: Two level, Able to live on main level with bedroom/bathroom Alternate Level Stairs-Number of Steps: flight Home Access: Stairs to enter Entrance Stairs-Number of Steps: 1 small step and a threshold to get into house, pt has been negotiating backwards w/ RW since L toe amputation Bathroom Shower/Tub: Multimedia programmer: Standard Home Care Services: Yes Type of Home Care Services: Home RN, [place] (if known): [place]  Discharge Living Setting Plans for Discharge Living Setting: Patient's home Type of Home at Discharge: Apartment Discharge Home Layout: Two level, Able to live on main level with bedroom/bathroom Discharge Home Access: Stairs to enter Entrance Stairs-Rails: None Entrance Stairs-Number of Steps: 3 Discharge Bathroom Shower/Tub: Tub/shower unit Discharge Bathroom Toilet: Standard Discharge Bathroom Accessibility: Yes How Accessible: Accessible via walker Does the patient have any problems obtaining your medications?: No  Social/Family/Support Systems Patient Roles: Spouse, Parent Anticipated Caregiver: self--wife has her own health issues Anticipated Ambulance person Information: [name]: [phone] Ability/Limitations of Caregiver: wife can do supervision only due to health issues and son lives up stairs and works Building control surveyor Availability:  24/7 Discharge Plan Discussed with Primary Caregiver: Yes Is Caregiver In Agreement with Plan?: Yes Does Caregiver/Family have Issues with Lodging/Transportation while Pt is in Rehab?: No  Goals/Additional Needs Patient/Family Goal for Rehab: PT/OT supervision to mod I Expected length of stay: 18-21 days Dietary Needs: carb modified/thin Pt/Family Agrees to Admission and willing to participate: Yes Program Orientation Provided & Reviewed with Pt/Caregiver Including Roles  & Responsibilities: Yes  Decrease burden of Care through IP rehab admission: n/a  Possible need for SNF placement upon discharge: not anticipated  Patient Condition: I have reviewed medical records from [facility], spoken with CM, and patient. I met with patient at the bedside for inpatient rehabilitation assessment.  Patient will benefit from ongoing PT and OT, can actively participate in 3 hours of therapy a day 5 days of the week, and can make measurable gains during the admission.  Patient will also benefit from the coordinated team approach during an Inpatient Acute Rehabilitation admission.  The patient will receive intensive therapy as well as Rehabilitation physician, nursing, social worker, and care management interventions.  Due to safety, skin/wound care, disease management, medication administration, pain management and patient education the patient requires 24 hour a day rehabilitation nursing.  The patient is currently min +2 with mobility and basic ADLs.  Discharge setting and therapy post discharge at home with home health is anticipated.  Patient has agreed to participate in the Acute Inpatient Rehabilitation Program and will admit today.  Preadmission  Screen Completed By:  Michel Santee, 08/17/2019 9:47 AM ______________________________________________________________________   Discussed status with Dr. Naaman Plummer on 08/17/19  at 9:52 AM  and received approval for admission today.  Admission  Coordinator:  Michel Santee, PT, DPT 9:52 AM Sudie Grumbling 08/17/19    Assessment/Plan: Diagnosis: left AKA 1. Does the need for close, 24 hr/day Medical supervision in concert with the patient's rehab needs make it unreasonable for this patient to be served in a less intensive setting? Yes 2. Co-Morbidities requiring supervision/potential complications: htn, dm,avr, ckd 3. Due to bladder management, bowel management, safety, skin/wound care, disease management, medication administration, pain management and patient education, does the patient require 24 hr/day rehab nursing? Yes 4. Does the patient require coordinated care of a physician, rehab nurse, PT (1-2 hrs/day, 5 days/week) and OT (1-2 hrs/day, 5 days/week) to address physical and functional deficits in the context of the above medical diagnosis(es)? Yes Addressing deficits in the following areas: balance, endurance, locomotion, strength, transferring, bowel/bladder control, bathing, dressing, feeding, grooming, toileting and psychosocial support 5. Can the patient actively participate in an intensive therapy program of at least 3 hrs of therapy 5 days a week? Yes 6. The potential for patient to make measurable gains while on inpatient rehab is excellent 7. Anticipated functional outcomes upon discharge from inpatients are: modified independent and supervision PT, modified independent and supervision OT, n/a SLP 8. Estimated rehab length of stay to reach the above functional goals is: 18-21 days 9. Anticipated D/C setting: Home 10. Anticipated post D/C treatments: [place] therapy 11. Overall Rehab/Functional Prognosis: excellent  MD Signature: Meredith Staggers, MD, [place] [facility] 08/17/2019         Revision History

## 2019-08-17 NOTE — H&P (Signed)
Physical Medicine and Rehabilitation Admission H&P    CC: Functional deficit due to L-AKA   HPI:  Joseph Hoffman is a 67 year old male with history of T2DM, CAD, CAF, mechanical AVR- on coumadin, OSA-CPAP with 2 L oxygen at nights, left great toe amputation 07/03/2019 who was admitted with nausea vomiting, poor wound healing and purulent drainage.  He was started on IV antibiotics and underwent left BKA on 07/31/2019 by Dr. Carlis Abbott.  Hospital course complicated by hypotension with AKI, urinary retention, lethargy with bouts of confusion felt to be due to narcotics as well as subtherapeutic INR requiring heparin/Coumadin bridge.  Therapy evaluations done revealing functional deficits and CIR was recommended for follow-up therapy.  He was admitted for comprehensive inpatient rehab on 08/05/2019.    He was maintained on IV heparin until INR greater than 2.5.  Pain control was reasonable with prn use of  oxycodone and Robaxin.  Foley was discontinued past admission and he was started on bladder program.  He continued to have problems with urinary retention requiring in and out caths at least 4-5 times a day and was placed on 1500 cc fluid restriction due to high PVRs.  Laxatives were added to help with OIC.  He was noted to develop increasing pain with edema of amputation site on 09/09.  Wound showed signs of ischemia and Dr. Carlis Abbott was consulted for input.  Surgical intervention recommended and patient was discharged to acute hospital for revision of wound to left AKA on zero 9/10 a.m.  Postop pain control is improving.  He continues to have serosanguineous drainage from amputation site and currently on IV heparin/Coumadin bridge as INR subtherapeutic.  Follow-up CBC shows H&H to be relatively stable.  Therapy was resumed and patient continues to have limitations in mobility and ADLs.  He was cleared to resume CIR program on 09/14.  .Review of Systems  Constitutional: Negative for chills and fever.   HENT: Negative for hearing loss and tinnitus.   Eyes: Negative for blurred vision and double vision.  Respiratory: Negative for cough and shortness of breath.   Cardiovascular: Negative for chest pain and palpitations.  Gastrointestinal: Positive for constipation (hasn't had BM since surgery. ). Negative for heartburn.  Genitourinary: Negative for dysuria and urgency.       Now "peeing like a horse"  Skin: Negative for rash.  Neurological: Positive for dizziness (with activity), sensory change and weakness. Negative for speech change and focal weakness.  Psychiatric/Behavioral: The patient is not nervous/anxious and does not have insomnia.       Past Medical History:  Diagnosis Date  . Anxiety   . Aortic stenosis   . Arthritis   . Asthma   . Asymptomatic LV dysfunction, EF 45%, normal coronary arteries on cardiac cath 09/18/12 09/30/2012  . CHF (congestive heart failure) ([place])   . Chronic anticoagulation, on Xarelto prior to admit 09/30/2012  . DM (diabetes mellitus) ([place]) 09/30/2012  . Hepatitis B 2003  . Hypertension   . Myocardial infarction ([place])   . Permanent atrial fibrillation, since 1994 09/30/2012   stress test 02/08/12- normal study, no significant ischemia  . S/P AVR (aortic valve replacement), 09/30/2012   a. s/p mechcanical AVR in 09/2012 (on Coumadin)  . Sleep apnea    uses CPAP   Past Surgical History:  Procedure Laterality Date  . ABDOMINAL ANGIOGRAM  09/18/2012   Procedure: ABDOMINAL ANGIOGRAM;  Surgeon: Troy Sine, MD;  Location: [facility] CATH LAB;  Service: Cardiovascular;;  .  ABDOMINAL AORTOGRAM W/LOWER EXTREMITY N/A 07/02/2019   Procedure: ABDOMINAL AORTOGRAM W/LOWER EXTREMITY;  Surgeon: Marty Heck, MD;  Location: [place] CV LAB;  Service: Cardiovascular;  Laterality: N/A;  . AMPUTATION Left 07/03/2019   Procedure: AMPUTATION LEFT GREAT TOE;  Surgeon: Marty Heck, MD;  Location: [place];  Service: Vascular;  Laterality: Left;  . AMPUTATION  Left 07/31/2019   Procedure: AMPUTATION BELOW KNEE LEFT;  Surgeon: Marty Heck, MD;  Location: [place];  Service: Vascular;  Laterality: Left;  . AMPUTATION Left 08/13/2019   Procedure: LEFT AMPUTATION ABOVE KNEE;  Surgeon: Marty Heck, MD;  Location: [place];  Service: Vascular;  Laterality: Left;  . AORTIC VALVE REPLACEMENT  09/29/2012   Procedure: AORTIC VALVE REPLACEMENT (AVR);  Surgeon: Gaye Pollack, MD;  Location: [place];  Service: Open Heart Surgery;  Laterality: N/A;  . ARCH AORTOGRAM  09/18/2012   Procedure: ARCH AORTOGRAM;  Surgeon: Troy Sine, MD;  Location: [facility] CATH LAB;  Service: Cardiovascular;;  . CARDIAC CATHETERIZATION  09/18/12   severe calcific aortic stenosis, peak gradient 43mm, mean gradient 37mm, EF 45%  . CARDIAC VALVE REPLACEMENT     AVR 09-29-12  . CHOLECYSTECTOMY    . FRACTURE SURGERY    . LEFT AND RIGHT HEART CATHETERIZATION WITH CORONARY ANGIOGRAM N/A 09/18/2012   Procedure: LEFT AND RIGHT HEART CATHETERIZATION WITH CORONARY ANGIOGRAM;  Surgeon: Troy Sine, MD;  Location: [facility] CATH LAB;  Service: Cardiovascular;  Laterality: N/A;  . PERIPHERAL VASCULAR INTERVENTION Left 07/02/2019   Procedure: PERIPHERAL VASCULAR INTERVENTION;  Surgeon: Marty Heck, MD;  Location: [place] CV LAB;  Service: Cardiovascular;  Laterality: Left;  . RIGHT HEART CATH AND CORONARY ANGIOGRAPHY N/A 06/29/2019   Procedure: RIGHT HEART CATH AND CORONARY ANGIOGRAPHY;  Surgeon: Nelva Bush, MD;  Location: [place] CV LAB;  Service: Cardiovascular;  Laterality: N/A;  . TEE WITHOUT CARDIOVERSION N/A 05/29/2019   Procedure: TRANSESOPHAGEAL ECHOCARDIOGRAM (TEE);  Surgeon: Lelon Perla, MD;  Location: [facility] ENDOSCOPY;  Service: Cardiovascular;  Laterality: N/A;   Family History  Problem Relation Age of Onset  . Hypertension Mother   . Diabetes Father   . Heart attack Brother 75  . Hyperlipidemia Brother 26       stents placed    Social History:  reports  that he quit smoking about 36 years ago. His smoking use included cigarettes. He has a 2.50 pack-year smoking history. He has quit using smokeless tobacco. He reports current alcohol use. He reports that he does not use drugs.    Allergies  Allergen Reactions  . Iohexol Anaphylaxis  . Niacin And Related     Flushing with immediate realese  . Penicillins Other (See Comments)    Unknown.[name]  Did it involve swelling of the face/tongue/throat, SOB, or low BP? Unknown Did it involve sudden or severe rash/hives, skin peeling, or any reaction on the inside of your mouth or nose? Unknown Did you need to seek medical attention at a hospital or doctor's office? Unknown When did it last happen?Childhood If all above answers are "NO", may proceed with cephalosporin use.    Medications Prior to Admission  Medication Sig Dispense Refill  . acetaminophen (TYLENOL) 325 MG tablet Take 2 tablets (650 mg total) by mouth every 4 (four) hours as needed for pain or fever. (Patient taking differently: Take 650 mg by mouth every 4 (four) hours as needed for fever or headache (pain). )    . albuterol (  PROVENTIL HFA;VENTOLIN HFA) 108 (90 BASE) MCG/ACT inhaler Inhale 2 puffs into the lungs every 6 (six) hours as needed for wheezing or shortness of breath.     . B Complex-C (B-COMPLEX WITH VITAMIN C) tablet Take 1 tablet by mouth daily. 30 tablet 0  . B-D ULTRAFINE III SHORT PEN 31G X 8 MM MISC     . cholecalciferol (VITAMIN D) 1000 UNITS tablet Take 1,000 Units by mouth daily with breakfast.     . digoxin (LANOXIN) 0.125 MG tablet TAKE 1 TABLET EVERY DAY (Patient taking differently: Take 0.125 mg by mouth daily. ) 90 tablet 1  . EASY TOUCH LANCETS 30G/TWIST MISC     . escitalopram (LEXAPRO) 10 MG tablet Take 10 mg by mouth at bedtime.     [name] ezetimibe (ZETIA) 10 MG tablet Take 1 tablet (10 mg total) by mouth daily. 90 tablet 3  . fenofibrate 160 MG tablet Take 1 tablet (160 mg total) by  mouth daily. (Patient taking differently: Take 160 mg by mouth daily with breakfast. ) 30 tablet 1  . Fluticasone-Salmeterol (ADVAIR) 250-50 MCG/DOSE AEPB Inhale 1 puff into the lungs 2 (two) times daily as needed (shortness of breath/asthma related symptoms).     . gabapentin (NEURONTIN) 300 MG capsule Take 1 capsule (300 mg total) by mouth 3 (three) times daily. 90 capsule 0  . heparin 25000-0.45 UT/250ML-% infusion Inject 2,650 Units/hr into the vein continuous. 250 mL 0  . Icosapent Ethyl (VASCEPA) 1 g CAPS Take 2 capsules (2 g total) by mouth 2 (two) times daily. (Patient taking differently: Take 2 g by mouth 2 (two) times daily with a meal. ) 360 capsule 3  . insulin glargine (LANTUS) 100 UNIT/ML injection Inject 15-40 Units into the skin See admin instructions. Inject 15 - 40 units subcutaneously daily before breakfast (adjusted per CBG); inject 30-40 units at bedtime ( occasionally adjusted per CBG - usually 35 units)    . liraglutide (VICTOZA) 18 MG/3ML SOPN Inject 1.8 mg into the skin at bedtime.    . metoprolol succinate (TOPROL XL) 25 MG 24 hr tablet Take 1 tablet (25 mg total) by mouth 2 (two) times daily. 90 tablet 1  . nitroGLYCERIN (NITROSTAT) 0.4 MG SL tablet Place 1 tablet (0.4 mg total) under the tongue every 5 (five) minutes as needed for chest pain. (Patient taking differently: Place 0.4 mg under the tongue every 5 (five) minutes x 3 doses as needed for chest pain. ) 30 tablet 2  . oxyCODONE-acetaminophen (PERCOCET) 10-325 MG tablet Take 1 tablet by mouth every 4 (four) hours as needed for pain. 20 tablet 0  . saccharomyces boulardii (FLORASTOR) 250 MG capsule Take 250 mg by mouth 3 (three) times a week.     . sacubitril-valsartan (ENTRESTO) 24-26 MG Take 1 tablet by mouth 2 (two) times daily. 60 tablet 3  . tamsulosin (FLOMAX) 0.4 MG CAPS capsule Take 1 capsule (0.4 mg total) by mouth daily after supper. 30 capsule 0  . torsemide (DEMADEX) 20 MG tablet Take 2 tablets (40 mg total)  by mouth every morning. (Patient taking differently: Take 20-40 mg by mouth See admin instructions. Take 2 tablets (40 mg) by mouth daily in the morning & take 1 tablet (20 mg) by mouth at night) 60 tablet 3  . vitamin C (ASCORBIC ACID) 500 MG tablet Take 500 mg by mouth 2 (two) times daily.    [name] warfarin (COUMADIN) 7.5 MG tablet Take 2 tablets (15 mg total) by mouth one time  only at 6 PM. 2 tablet 0    Drug Regimen Review  Drug regimen was reviewed and remains appropriate with no significant issues identified  Home: Home Living Family/patient expects to be discharged to:: Private residence Living Arrangements: Spouse/significant other, Children Available Help at Discharge: Available 24 hours/day, Family Type of Home: House Home Access: Stairs to enter [employer] of Steps: 1 small step and a threshold to get into house, pt has been negotiating backwards w/ RW since L toe amputation Home Layout: Two level, Able to live on main level with bedroom/bathroom Alternate Level Stairs-Number of Steps: flight Bathroom Shower/Tub: Multimedia programmer: Standard  Lives With: Spouse   Functional History: Prior Function Level of Independence: Independent Comments: pt used to be a Academic librarian, independent with all ADL and no AD  Functional Status:  Mobility: Bed Mobility Overal bed mobility: Needs Assistance Bed Mobility: Supine to Sit Supine to sit: Mod assist General bed mobility comments: Pt given cues to roll and push up to sitting - using rail and HOB elevated.  pt reminded to use his right leg to assist - when he did this - it was much easier for him.  pt able to scoot forward and get his feet on the floor with min assist using chuck. Transfers Overall transfer level: Needs assistance Equipment used: None Transfers: Lateral/Scoot Transfers  Lateral/Scoot Transfers: Min assist, +2 physical assistance General transfer comment: Pt transfering from higher bed to lower  chair to the right side - scoot transfer with min assist of 2.  pt did well with optimal set up.  Pt became less responsive in chair and ended up needing +4 total assist to scoot back into the bed and lay down.  Nursing present. Ambulation/Gait General Gait Details: unable - did not try to stand today    ADL: ADL Overall ADL's : Needs assistance/impaired Eating/Feeding: Set up, Sitting Eating/Feeding Details (indicate cue type and reason): EOB Grooming: Set up, Supervision/safety, Sitting Grooming Details (indicate cue type and reason): EOB Upper Body Bathing: Supervision/ safety, Set up, Sitting Upper Body Bathing Details (indicate cue type and reason): EOB Lower Body Bathing: Maximal assistance, Bed level Upper Body Dressing : Supervision/safety, Set up, Sitting Upper Body Dressing Details (indicate cue type and reason): EOB Lower Body Dressing: Total assistance, Bed level Toilet Transfer: Minimal assistance, +2 for physical assistance Toilet Transfer Details (indicate cue type and reason): lateral scoot to right  Cognition: Cognition Overall Cognitive Status: Within Functional Limits for tasks assessed Orientation Level: Oriented X4 Cognition Arousal/Alertness: Awake/alert Behavior During Therapy: WFL for tasks assessed/performed Overall Cognitive Status: Within Functional Limits for tasks assessed General Comments: Very motivated and friendly during session; initally hesitant to work with therapy d/t pain but participated well.  Once in the chiar - pt became less responsive - delayed and inconsistent responses, not focused visually.  Pts HR throughout treatment 54-74 and irregular.  pts BP 124/76 in bed, 106/60 in the chair and after 10 minutes down to 98/54.  pt total assisted back to bed.  Nursing present and rapid response called.  Pts blood sugar 406.  Left pt with 4 staff members working with him    Blood pressure 131/62, pulse 82, temperature 98.8 F (37.1 C), temperature  source Oral, resp. rate 13, height 5\' 11"  (1.803 m), weight 102.5 kg, SpO2 99 %. Physical Exam  Nursing note and vitals reviewed. Constitutional: He is oriented to person, place, and time. He appears well-developed and well-nourished.  Uncomfortable d/t pain  HENT:  Head: Normocephalic and atraumatic.  Eyes: Pupils are equal, round, and reactive to light. EOM are normal.  Neck: Normal range of motion. No tracheal deviation present. No thyromegaly present.  Cardiovascular: Normal rate. An irregularly irregular rhythm present. Exam reveals no friction rub.  No murmur heard. +click  Respiratory: Effort normal. No respiratory distress. He has no wheezes.  GI: Soft. He exhibits no distension. There is no abdominal tenderness.  Musculoskeletal:        General: Tenderness and edema (L-AKA with moderate edema) present.  Neurological: He is alert and oriented to person, place, and time.  UE 5/6. RLE 4/5 prox to distal. Able to lift left leg against gravity. No sensory deficits  Skin: Skin is warm.  Incision with staples intact--small clot seen on medial aspect of wound. Wound continues to have moderate to large amount of serosanguinous drainage from lateral and medial aspect--soaked thorough dressing onto the bed. RLE intact without breakdown.   Psychiatric: He has a normal mood and affect. His behavior is normal. Judgment and thought content normal.      Results for orders placed or performed during the hospital encounter of 08/13/19 (from the past 48 hour(s))  Glucose, capillary     Status: Abnormal   Collection Time: 08/15/19 11:28 AM  Result Value Ref Range   Glucose-Capillary 253 (H) 70 - 99 mg/dL  Protime-INR     Status: Abnormal   Collection Time: 08/15/19 11:42 AM  Result Value Ref Range   Prothrombin Time 15.7 (H) 11.4 - 15.2 seconds   INR 1.3 (H) 0.8 - 1.2    Comment: (NOTE) INR goal varies based on device and disease states. Performed at [facility], [place] [address] [place], [place] [address]   Heparin level (unfractionated)     Status: Abnormal   Collection Time: 08/15/19  3:02 PM  Result Value Ref Range   Heparin Unfractionated 1.00 (H) 0.30 - 0.70 IU/mL    Comment: (NOTE) If heparin results are below expected values, and patient dosage has  been confirmed, suggest follow up testing of antithrombin III levels. Performed at [facility], [place] [address], [place], [place] [address]   Glucose, capillary     Status: Abnormal   Collection Time: 08/15/19  4:13 PM  Result Value Ref Range   Glucose-Capillary 336 (H) 70 - 99 mg/dL  Glucose, capillary     Status: Abnormal   Collection Time: 08/15/19  8:42 PM  Result Value Ref Range   Glucose-Capillary 318 (H) 70 - 99 mg/dL   Comment 1 Notify RN    Comment 2 Document in Chart   Heparin level (unfractionated)     Status: None   Collection Time: 08/15/19 11:31 PM  Result Value Ref Range   Heparin Unfractionated 0.61 0.30 - 0.70 IU/mL    Comment: (NOTE) If heparin results are below expected values, and patient dosage has  been confirmed, suggest follow up testing of antithrombin III levels. Performed at [facility], [place] [address], [place], [place] [address]   Heparin level (unfractionated)     Status: None   Collection Time: 08/16/19  2:31 AM  Result Value Ref Range   Heparin Unfractionated 0.69 0.30 - 0.70 IU/mL    Comment: (NOTE) If heparin results are below expected values, and patient dosage has  been confirmed, suggest follow up testing of antithrombin III levels. Performed at [facility], [place] [address], [place], [place] [address]   Protime-INR  Status: None   Collection Time: 08/16/19  2:31 AM  Result Value Ref Range   Prothrombin Time 15.2 11.4 - 15.2 seconds   INR 1.2 0.8 - 1.2    Comment: (NOTE) INR goal varies based on device and disease states. Performed at [facility], [place] [address] [place], [place] [address]   CBC     Status: Abnormal    Collection Time: 08/16/19  2:31 AM  Result Value Ref Range   WBC 9.4 4.0 - 10.5 K/uL   RBC 3.36 (L) 4.22 - 5.81 MIL/uL   Hemoglobin 8.8 (L) 13.0 - 17.0 g/dL   HCT 27.8 (L) 39.0 - 52.0 %   MCV 82.7 80.0 - 100.0 fL   MCH 26.2 26.0 - 34.0 pg   MCHC 31.7 30.0 - 36.0 g/dL   RDW 18.3 (H) 11.5 - 15.5 %   Platelets 316 150 - 400 K/uL   nRBC 0.0 0.0 - 0.2 %    Comment: Performed at [facility], [place] [address] [place], [place] [address]  Glucose, capillary     Status: Abnormal   Collection Time: 08/16/19  6:16 AM  Result Value Ref Range   Glucose-Capillary 271 (H) 70 - 99 mg/dL   Comment 1 Notify RN    Comment 2 Document in Chart   Glucose, capillary     Status: Abnormal   Collection Time: 08/16/19 11:07 AM  Result Value Ref Range   Glucose-Capillary 263 (H) 70 - 99 mg/dL  Heparin level (unfractionated)     Status: None   Collection Time: 08/16/19  2:46 PM  Result Value Ref Range   Heparin Unfractionated 0.63 0.30 - 0.70 IU/mL    Comment: (NOTE) If heparin results are below expected values, and patient dosage has  been confirmed, suggest follow up testing of antithrombin III levels. Performed at [facility], [place] [address], [place], [place] [address]   Glucose, capillary     Status: Abnormal   Collection Time: 08/16/19  5:13 PM  Result Value Ref Range   Glucose-Capillary 263 (H) 70 - 99 mg/dL  Glucose, capillary     Status: Abnormal   Collection Time: 08/16/19  8:48 PM  Result Value Ref Range   Glucose-Capillary 263 (H) 70 - 99 mg/dL   Comment 1 Document in Chart   Protime-INR     Status: Abnormal   Collection Time: 08/17/19  5:55 AM  Result Value Ref Range   Prothrombin Time 16.5 (H) 11.4 - 15.2 seconds   INR 1.4 (H) 0.8 - 1.2    Comment: (NOTE) INR goal varies based on device and disease states. Performed at [facility], [place] [address] [place], [place] [address]   CBC     Status: Abnormal   Collection Time: 08/17/19  5:55 AM  Result Value Ref Range    WBC 11.1 (H) 4.0 - 10.5 K/uL   RBC 3.27 (L) 4.22 - 5.81 MIL/uL   Hemoglobin 8.7 (L) 13.0 - 17.0 g/dL   HCT 27.8 (L) 39.0 - 52.0 %   MCV 85.0 80.0 - 100.0 fL   MCH 26.6 26.0 - 34.0 pg   MCHC 31.3 30.0 - 36.0 g/dL   RDW 18.3 (H) 11.5 - 15.5 %   Platelets 356 150 - 400 K/uL   nRBC 0.0 0.0 - 0.2 %    Comment: Performed at [facility], [place] [address] [place], [place] [address]  Heparin level (unfractionated)  Status: None   Collection Time: 08/17/19  5:55 AM  Result Value Ref Range   Heparin Unfractionated 0.40 0.30 - 0.70 IU/mL    Comment: (NOTE) If heparin results are below expected values, and patient dosage has  been confirmed, suggest follow up testing of antithrombin III levels. Performed at [facility], [place] [address], [place], [place] [address]   Glucose, capillary     Status: Abnormal   Collection Time: 08/17/19  6:34 AM  Result Value Ref Range   Glucose-Capillary 292 (H) 70 - 99 mg/dL   Comment 1 Document in Chart    No results found.     Medical Problem List and Plan: 1.  Fxnl and mobility deficits secondary to PAD and left AKA  -admit to inpatient rehab 2.  Mechanical AVR/Antithrombotics: -DVT/anticoagulation:  Pharmaceutical: Coumadin and Heparin till INR> 2.5.             -antiplatelet therapy:  N/A 3. Pain Management: Still has a lot of pain--phantom as well as surgical post op pain and ongoing issues with muscle spasms.  Schedule low dose flexeril tid to help with spasms.  -May need neurontin titrate upwarrds.   -schedule oxycodone in AM prior to therapies 4. Mood: LCSW to follow for evaluation and support.              -antipsychotic agents: N/A 5. Neuropsych: This patient is capable of making decisions on his own behalf. 6. Skin/Wound Care: Monitor wound daily.  -Increase dressing changes to bid due to amount of serosanguinous drainage that is ongoing.  7. Fluids/Electrolytes/Nutrition: Monitor I/O. Check lytes in am.  8. T2DM: Monitor  BS ac/hs. Continue Lantus 40 mg bid with Victoza--resume amaryl. Continue to hold  9. CAD/CAF: Monitor for presyncope/syncope as back on Entresto.  10 Chronic systolic CHF: Heart healthy diet and monitor for signs of overload. Check weight daily. On Entresto, Demadex, metoprolol and fenofibrate--wife to bring in [place].  11. Urinary retention: Reports voiding without difficulty. Will order PVRs X 3 to monitor. 12. Constipation: Will start bowel program as this has been an issues since first surgery.  13. Hyponatremia: Recheck labs in am.  14. Acute on chronic anemia: H/H is stable. Rise in WBC noted--monitor for sign of infecton.  15. OSA: CPAP when sleeping with 2 L bleed in.  16. Acute on chronic renal failure:Trending back to baseline? Monitor for recurrent presyncope.        Bary Leriche, PA-C 08/17/2019

## 2019-08-17 NOTE — Progress Notes (Addendum)
Inpatient Rehab Admissions Coordinator:   I have a bed available for pt to admit to CIR today. I await word from attending that he is ready to admit.   Addendum: I have approval from [facility], PA-C, for pt to admit to CIR today. I will let pt/family, and CM know.   Shann Medal, PT, DPT Admissions Coordinator [phone] 08/17/19  9:29 AM

## 2019-08-17 NOTE — Progress Notes (Addendum)
  Progress Note    08/17/2019 7:56 AM 4 Days Post-Op  Subjective:  No complaints  Afebrile HR 60's-80's afib [id number] systolic [id number] CPAP  Vitals:   08/16/19 1954 08/17/19 0409  BP:  (!) 122/53  Pulse:  74  Resp:  (!) 23  Temp:  98.4 F (36.9 C)  SpO2: 97% 100%    Physical Exam: Incisions:  Bandage with bloody drainage-removed and incision looks good-no active oozing with bandage off.    CBC    Component Value Date/Time   WBC 11.1 (H) 08/17/2019 0555   RBC 3.27 (L) 08/17/2019 0555   HGB 8.7 (L) 08/17/2019 0555   HGB 13.8 04/03/2018 0809   HCT 27.8 (L) 08/17/2019 0555   HCT 42.4 04/03/2018 0809   PLT 356 08/17/2019 0555   PLT 168 04/03/2018 0809   MCV 85.0 08/17/2019 0555   MCV 84 04/03/2018 0809   MCH 26.6 08/17/2019 0555   MCHC 31.3 08/17/2019 0555   RDW 18.3 (H) 08/17/2019 0555   RDW 15.5 (H) 04/03/2018 0809   LYMPHSABS 0.9 08/12/2019 1309   MONOABS 1.3 (H) 08/12/2019 1309   EOSABS 0.4 08/12/2019 1309   BASOSABS 0.1 08/12/2019 1309    BMET    Component Value Date/Time   NA 132 (L) 08/15/2019 0256   NA 139 03/13/2019 1511   K 4.3 08/15/2019 0256   CL 97 (L) 08/15/2019 0256   CO2 23 08/15/2019 0256   GLUCOSE 279 (H) 08/15/2019 0256   BUN 45 (H) 08/15/2019 0256   BUN 42 (H) 03/13/2019 1511   CREATININE 1.50 (H) 08/15/2019 0256   CREATININE 1.06 08/23/2014 0853   CALCIUM 8.9 08/15/2019 0256   GFRNONAA 46 (L) 08/15/2019 0256   GFRAA 54 (L) 08/15/2019 0256    INR    Component Value Date/Time   INR 1.4 (H) 08/17/2019 0555     Intake/Output Summary (Last 24 hours) at 08/17/2019 0756 Last data filed at 08/17/2019 0534 Gross per 24 hour  Intake 764.15 ml  Output 2200 ml  Net -1435.85 ml     Assessment/Plan:  68 y.o. male is s/p left above knee amputation  4 Days Post-Op  -left AKA bandage with bloody drainage-removed and incision looks good with staples in tact.  No active oozing with bandage off.   Bandage placed back on stump.    -continue heparin gtt per pharmacy for mechanical valve and afib.  INR today is 1.4 -disposition will be back to [place], PA-C [facility] [phone] 08/17/2019 7:56 AM   I have independently interviewed and examined patient and agree with PA assessment and plan above.  H&H is stable with dressing off did not appear to be actively oozing.  Would expect some level of his given heparin drip overall he is progressing well and okay to return to rehab.  Isauro Skelley C. Donzetta Matters, MD [facility] [place] Office: [phone] Pager: [phone]

## 2019-08-17 NOTE — Discharge Summary (Signed)
Discharge Summary    Joseph Hoffman 11-28-1951 68 y.o. male  [id number]  Admission Date: 08/13/2019  Discharge Date: 08/17/2019  Physician: Marty Heck, MD  Admission Diagnosis: STUMP BREAKDOWN GANGRENE   HPI:   This is a 68 y.o. male who unfortunately has had a prolonged course after he presented with critical limb ischemia with tissue loss of the left lower extremity.  Initially underwent arteriogram and had inline flow down the peroneal in the setting of severe tibial disease and had a toe amputation that failed to heal.  Ultimately he was converted to a below-knee amputation and that subsequently failed to heal as well.  We offered him the option of BKA revision versus conversion to above-knee amputation and he chose to proceed with above-knee amputation after risk benefits were discussed including ongoing failure of wound healing.  Hospital Course:  The patient was admitted to the hospital and taken to the operating room on 08/13/2019 and underwent: Left AKA    Findings: Left above-knee amputation with healthy tissue margins and good bleeding in the wound bed.  The pt tolerated the procedure well and was transported to the PACU in good condition.   Pt did well and by POD 3, pt continuing heparin to coumadin bridge.  He is discharged to CIR to continue his rehab.  He had acute blood loss anemia that he tolerated well.    The remainder of the hospital course consisted of increasing mobilization and increasing intake of solids without difficulty.  CBC    Component Value Date/Time   WBC 11.1 (H) 08/17/2019 0555   RBC 3.27 (L) 08/17/2019 0555   HGB 8.7 (L) 08/17/2019 0555   HGB 13.8 04/03/2018 0809   HCT 27.8 (L) 08/17/2019 0555   HCT 42.4 04/03/2018 0809   PLT 356 08/17/2019 0555   PLT 168 04/03/2018 0809   MCV 85.0 08/17/2019 0555   MCV 84 04/03/2018 0809   MCH 26.6 08/17/2019 0555   MCHC 31.3 08/17/2019 0555   RDW 18.3 (H) 08/17/2019 0555   RDW 15.5 (H)  04/03/2018 0809   LYMPHSABS 0.9 08/12/2019 1309   MONOABS 1.3 (H) 08/12/2019 1309   EOSABS 0.4 08/12/2019 1309   BASOSABS 0.1 08/12/2019 1309    BMET    Component Value Date/Time   NA 132 (L) 08/15/2019 0256   NA 139 03/13/2019 1511   K 4.3 08/15/2019 0256   CL 97 (L) 08/15/2019 0256   CO2 23 08/15/2019 0256   GLUCOSE 279 (H) 08/15/2019 0256   BUN 45 (H) 08/15/2019 0256   BUN 42 (H) 03/13/2019 1511   CREATININE 1.50 (H) 08/15/2019 0256   CREATININE 1.06 08/23/2014 0853   CALCIUM 8.9 08/15/2019 0256   GFRNONAA 46 (L) 08/15/2019 0256   GFRAA 54 (L) 08/15/2019 0256      Discharge Instructions    Discharge patient   Complete by: As directed    Discharge to CIR today on heparin gtt to coumadin.   Discharge disposition: [place] Not Defined   Discharge patient date: 08/17/2019      Discharge Diagnosis:  STUMP BREAKDOWN GANGRENE  Secondary Diagnosis: Patient Active Problem List   Diagnosis Date Noted  . Nonhealing surgical wound 08/13/2019  . Non-healing amputation site ([place]) 08/13/2019  . Type 2 diabetes mellitus with peripheral neuropathy (HCC)   . Chronic combined systolic and diastolic CHF (congestive heart failure) ([place])   . Myalgia   . Urinary retention   . Insomnia due to medical condition   .  Drug-induced constipation   . Anticoagulated on Coumadin   . Unilateral complete BKA, left, initial encounter ([place]) 08/05/2019  . Leukocytosis   . Acute on chronic anemia   . Post-operative pain   . Nausea & vomiting 07/29/2019  . Diarrhea 07/29/2019  . Diabetic ulcer of left foot associated with diabetes mellitus due to underlying condition ([place]) 07/29/2019  . Left ventricular ejection fraction of 30% to 35% 07/29/2019  . PAD (peripheral artery disease) ([place]) 07/28/2019  . Long term (current) use of anticoagulants 07/13/2019  . CAD, multiple vessel 07/10/2019  . Aortic valve regurgitation   . Aortic valve disease   . Foot ulcer, left  ([place]) 06/26/2019  . Depression 05/28/2019  . Congestive heart failure (CHF) ([place]) 05/13/2019  . H/O heart valve replacement with mechanical valve   . Type II diabetes mellitus with renal manifestations ([place]) 05/12/2019  . Chest pain 05/12/2019  . Elevated troponin 05/12/2019  . Stage 3 chronic kidney disease ([place]) 05/12/2019  . Acute on chronic systolic (congestive) heart failure ([place]) 05/12/2019  . CRI (chronic renal insufficiency), stage 3 (moderate) ([place]) 04/09/2019  . Acute combined systolic and diastolic heart failure ([place]) 04/09/2019  . Essential hypertension 06/03/2017  . Dyslipidemia, goal LDL below 70 04/11/2017  . AS (aortic stenosis) 10/28/2012  . Acute on chronic systolic heart failure, re-admitted 10/12/12 10/12/2012  . S/P AVR, 09/29/12, [facility]. (discharged 10/05/12) 09/30/2012  . Permanent atrial fibrillation, since 1994 09/30/2012  . Type 2 IDDM 09/30/2012  . OSA on CPAP 09/30/2012  . Normal coronary arteries at cath Oct 2013 09/30/2012  . Chronic anticoagulation 09/30/2012  . Severe aortic stenosis 09/25/2012   Past Medical History:  Diagnosis Date  . Anxiety   . Aortic stenosis   . Arthritis   . Asthma   . Asymptomatic LV dysfunction, EF 45%, normal coronary arteries on cardiac cath 09/18/12 09/30/2012  . CHF (congestive heart failure) ([place])   . Chronic anticoagulation, on Xarelto prior to admit 09/30/2012  . DM (diabetes mellitus) ([place]) 09/30/2012  . Hepatitis B 2003  . Hypertension   . Myocardial infarction ([place])   . Permanent atrial fibrillation, since 1994 09/30/2012   stress test 02/08/12- normal study, no significant ischemia  . S/P AVR (aortic valve replacement), 09/30/2012   a. s/p mechcanical AVR in 09/2012 (on Coumadin)  . Sleep apnea    uses CPAP     Allergies as of 08/17/2019      Reactions   Iohexol Anaphylaxis   Niacin And Related    Flushing with immediate realese   Penicillins Other (See Comments)   Unknown.[name]  Did it involve swelling of the face/tongue/throat, SOB, or low BP? Unknown Did it involve sudden or severe rash/hives, skin peeling, or any reaction on the inside of your mouth or nose? Unknown Did you need to seek medical attention at a hospital or doctor's office? Unknown When did it last happen?Childhood If all above answers are "NO", may proceed with cephalosporin use.      Medication List    TAKE these medications   acetaminophen 325 MG tablet Commonly known as: TYLENOL Take 2 tablets (650 mg total) by mouth every 4 (four) hours as needed for pain or fever. What changed: reasons to take this   albuterol 108 (90 Base) MCG/ACT inhaler Commonly known as: VENTOLIN HFA Inhale 2 puffs into the lungs every 6 (six) hours as needed for wheezing or shortness of breath.   B-complex with vitamin C tablet Take 1  tablet by mouth daily.   B-D ULTRAFINE III SHORT PEN 31G X 8 MM Misc Generic drug: Insulin Pen Needle   cholecalciferol 1000 units tablet Commonly known as: VITAMIN D Take 1,000 Units by mouth daily with breakfast.   digoxin 0.125 MG tablet Commonly known as: LANOXIN TAKE 1 TABLET EVERY DAY   Easy Touch Lancets 30G/Twist Misc   escitalopram 10 MG tablet Commonly known as: LEXAPRO Take 10 mg by mouth at bedtime.   ezetimibe 10 MG tablet Commonly known as: ZETIA Take 1 tablet (10 mg total) by mouth daily.   fenofibrate 160 MG tablet Take 1 tablet (160 mg total) by mouth daily. What changed: when to take this   Fluticasone-Salmeterol 250-50 MCG/DOSE Aepb Commonly known as: ADVAIR Inhale 1 puff into the lungs 2 (two) times daily as needed (shortness of breath/asthma related symptoms).   gabapentin 300 MG capsule Commonly known as: NEURONTIN Take 1 capsule (300 mg total) by mouth 3 (three) times daily.   heparin 25000-0.45 UT/250ML-% infusion Inject 2,650 Units/hr into the vein continuous.   Icosapent Ethyl 1 g Caps Commonly known as: Vascepa Take 2  capsules (2 g total) by mouth 2 (two) times daily. What changed: when to take this   insulin glargine 100 UNIT/ML injection Commonly known as: LANTUS Inject 15-40 Units into the skin See admin instructions. Inject 15 - 40 units subcutaneously daily before breakfast (adjusted per CBG); inject 30-40 units at bedtime ( occasionally adjusted per CBG - usually 35 units)   liraglutide 18 MG/3ML Sopn Commonly known as: VICTOZA Inject 1.8 mg into the skin at bedtime.   metoprolol succinate 25 MG 24 hr tablet Commonly known as: Toprol XL Take 1 tablet (25 mg total) by mouth 2 (two) times daily.   nitroGLYCERIN 0.4 MG SL tablet Commonly known as: NITROSTAT Place 1 tablet (0.4 mg total) under the tongue every 5 (five) minutes as needed for chest pain. What changed: when to take this   oxyCODONE-acetaminophen 10-325 MG tablet Commonly known as: Percocet Take 1 tablet by mouth every 4 (four) hours as needed for pain.   saccharomyces boulardii 250 MG capsule Commonly known as: FLORASTOR Take 250 mg by mouth 3 (three) times a week.   sacubitril-valsartan 24-26 MG Commonly known as: ENTRESTO Take 1 tablet by mouth 2 (two) times daily.   tamsulosin 0.4 MG Caps capsule Commonly known as: FLOMAX Take 1 capsule (0.4 mg total) by mouth daily after supper.   torsemide 20 MG tablet Commonly known as: DEMADEX Take 2 tablets (40 mg total) by mouth every morning. What changed:   how much to take  when to take this  additional instructions   vitamin C 500 MG tablet Commonly known as: ASCORBIC ACID Take 500 mg by mouth 2 (two) times daily.   warfarin 7.5 MG tablet Commonly known as: COUMADIN Take as directed. If you are unsure how to take this medication, talk to your nurse or doctor. Original instructions: Take 2 tablets (15 mg total) by mouth one time only at 6 PM.       Prescriptions given: None, but pt should continue heparin to coumadin bridge  Instructions: 1.  Resume  previous instructions  Disposition: CIR  Patient's condition: is Good  Follow up: 1. Dr. Carlis Abbott in 4 weeks   Leontine Locket, PA-C [facility] [phone] 08/17/2019  11:38 AM

## 2019-08-17 NOTE — Progress Notes (Signed)
Physical Therapy Treatment Patient Details Name: Joseph Hoffman MRN: [id number] DOB: 04-17-1951 Today's Date: 08/17/2019    History of Present Illness 68 y.o. male with medical history significant of hypertension, diabetes mellitus type 2, asthma, systolic congestive heart failure with EF of 30 to 35%, coronary artery disease, atrial fibrillation on warfarin, aortic stenosis status post AVR with mechanical valve on Coumadin needing replacement, obstructive sleep apnea on CPAP, chronic kidney disease stage III, depression with anxiety. Presents to hospital from vascular office for follow up of L great toe amputation 07/03/19. Admitted for treatment of infection of surgical site. S/p L BKA 07/31/19. Went to CIR and back to acute for AKA 08/13/19.    PT Comments    Pt admitted with above diagnosis. Pt noted to have orthostasis at EOB.  Last  BP prior to session 111/49 with HR 48 bpm. BP initially 105/50 with HR 79 bpm.   Once at EOB, pt with blank stare and had to lie him down and BP finally registered at 106/61 with HR 66 bpm.  Progressively inclined bed to 53 degree chair position and BP 104/39 with HR 62 bpm.  After sitting in chair position at 53 degrees for 3 minutes, BP was 131/50 with HR 60 bpm.  BP fluctuations limiting pt ability to progress mobility.   Pt currently with functional limitations due to balance and endurance deficits. Pt will benefit from skilled PT to increase their independence and safety with mobility to allow discharge to the venue listed below.    Follow Up Recommendations  CIR;Supervision for mobility/OOB     Equipment Recommendations  Other (comment)    Recommendations for Other Services       Precautions / Restrictions Precautions Precautions: Fall Precaution Comments: monitor HR, sats and BP Restrictions Weight Bearing Restrictions: Yes LLE Weight Bearing: Non weight bearing Other Position/Activity Restrictions: L AKA - in ace wrap.  no drainage seen through  wrap    Mobility  Bed Mobility Overal bed mobility: Needs Assistance Bed Mobility: Supine to Sit     Supine to sit: Mod assist;+2 for physical assistance     General bed mobility comments: Pt given cues to roll and push up to sitting - using rail and HOB elevated.  pt reminded to use his right leg to assist - when he did this - it was much easier for him.  pt able to scoot forward and get his feet on the floor with min assist using chuck.   Once to EOB, pt with blank stare and not answering PT.  LAid pt down and he stated he could hear PT but could not speak.   Pts HR throughout treatment 48-79 bpm and irregular.  pts BP 111/49 in bed, 106/61 after at EOB  (would not register at EOB). BP after a few min in supine was 104/39.  Placed pt in 53 degree chair position to leave him sitting up and BP was 104/39 with HR of 62 initially  and after 3 min in chiar position, 131/50 with HR of 60 bpm.     Transfers                 General transfer comment: unable due to orthostatsis.  Ambulation/Gait         Gait velocity:         Stairs             Wheelchair Mobility    Modified Rankin (Stroke Patients Only)  Balance Overall balance assessment: Needs assistance Sitting-balance support: (foot supported) Sitting balance-Leahy Scale: Good Sitting balance - Comments: pt able to sit EOB with RLe on the floor but then decr responsiveness.                                     Cognition Arousal/Alertness: Awake/alert Behavior During Therapy: WFL for tasks assessed/performed Overall Cognitive Status: Within Functional Limits for tasks assessed                                 General Comments: Very motivated and friendly during session; initally hesitant to work with therapy d/t pain but participated well.  Once at EOB - pt became less responsive - delayed and inconsistent responses, not focused visually.  Pts HR throughout treatment 48-79 bpm  and irregular.  pts BP 111/49 in bed, 106/61 after at EOB  (would not register at EOB).       Exercises General Exercises - Upper Extremity Shoulder Flexion: Strengthening;Both;5 reps;Supine;Theraband Theraband Level (Shoulder Flexion): Level 3 (Green) Shoulder Extension: AROM;Both;5 reps;Supine;Theraband Theraband Level (Shoulder Extension): Level 3 (Green) Shoulder Horizontal ADduction: AROM;Both;10 reps;Theraband Elbow Flexion: AROM;Both;5 reps;Supine;Theraband Theraband Level (Elbow Flexion): Level 3 (Green) Elbow Extension: AROM;Both;5 reps;Supine;Theraband Theraband Level (Elbow Extension): Level 3 (Green)    General Comments        Pertinent Vitals/Pain Pain Assessment: Faces Faces Pain Scale: Hurts even more Pain Location: L leg - esp hamstring Pain Descriptors / Indicators: Aching;Grimacing;Pressure Pain Intervention(s): Limited activity within patient's tolerance;Monitored during session;Repositioned    Home Living                      Prior Function            PT Goals (current goals can now be found in the care plan section) Acute Rehab PT Goals Patient Stated Goal: to be able to go back to rehab and then home Progress towards PT goals: Progressing toward goals    Frequency    Min 3X/week      PT Plan Current plan remains appropriate    Co-evaluation              AM-PAC PT "6 Clicks" Mobility   Outcome Measure  Help needed turning from your back to your side while in a flat bed without using bedrails?: A Lot Help needed moving from lying on your back to sitting on the side of a flat bed without using bedrails?: A Lot Help needed moving to and from a bed to a chair (including a wheelchair)?: A Lot Help needed standing up from a chair using your arms (e.g., wheelchair or bedside chair)?: Total Help needed to walk in hospital room?: Total Help needed climbing 3-5 steps with a railing? : Total 6 Click Score: 9    End of Session  Equipment Utilized During Treatment: Gait belt Activity Tolerance: Treatment limited secondary to medical complications (Comment)(orthostasis) Patient left: in bed;with call bell/phone within reach(in 53 degree chair position) Nurse Communication: Mobility status PT Visit Diagnosis: Unsteadiness on feet (R26.81);Other abnormalities of gait and mobility (R26.89);Muscle weakness (generalized) (M62.81);Difficulty in walking, not elsewhere classified (R26.2);Pain Pain - Right/Left: Left Pain - part of body: Leg     Time: [id number] PT Time Calculation (min) (ACUTE ONLY): 37 min  Charges:  $Therapeutic Exercise: 8-22 mins $Therapeutic Activity: 8-22 mins  [place] Pager:  [phone]  Office:  [place] 08/17/2019, 4:26 PM

## 2019-08-17 NOTE — Discharge Instructions (Signed)

## 2019-08-18 ENCOUNTER — Inpatient Hospital Stay (HOSPITAL_COMMUNITY): Payer: Medicare Other | Admitting: Occupational Therapy

## 2019-08-18 ENCOUNTER — Inpatient Hospital Stay (HOSPITAL_COMMUNITY): Payer: Medicare Other

## 2019-08-18 LAB — COMPREHENSIVE METABOLIC PANEL
ALT: 30 U/L (ref 0–44)
AST: 38 U/L (ref 15–41)
Albumin: 2.4 g/dL — ABNORMAL LOW (ref 3.5–5.0)
Alkaline Phosphatase: 41 U/L (ref 38–126)
Anion gap: 11 (ref 5–15)
BUN: 47 mg/dL — ABNORMAL HIGH (ref 8–23)
CO2: 26 mmol/L (ref 22–32)
Calcium: 8.8 mg/dL — ABNORMAL LOW (ref 8.9–10.3)
Chloride: 95 mmol/L — ABNORMAL LOW (ref 98–111)
Creatinine, Ser: 1.72 mg/dL — ABNORMAL HIGH (ref 0.61–1.24)
GFR calc Af Amer: 46 mL/min — ABNORMAL LOW (ref >60)
GFR calc non Af Amer: 39 mL/min — ABNORMAL LOW (ref >60)
Glucose, Bld: 373 mg/dL — ABNORMAL HIGH (ref 70–99)
Potassium: 4.7 mmol/L (ref 3.5–5.1)
Sodium: 132 mmol/L — ABNORMAL LOW (ref 135–145)
Total Bilirubin: 0.6 mg/dL (ref 0.3–1.2)
Total Protein: 5.9 g/dL — ABNORMAL LOW (ref 6.5–8.1)

## 2019-08-18 LAB — CBC WITH DIFFERENTIAL/PLATELET
Abs Immature Granulocytes: 0.07 10*3/uL (ref 0.00–0.07)
Basophils Absolute: 0.1 10*3/uL (ref 0.0–0.1)
Basophils Relative: 1 %
Eosinophils Absolute: 0.5 10*3/uL (ref 0.0–0.5)
Eosinophils Relative: 4 %
HCT: 24.4 % — ABNORMAL LOW (ref 39.0–52.0)
Hemoglobin: 7.5 g/dL — ABNORMAL LOW (ref 13.0–17.0)
Immature Granulocytes: 1 %
Lymphocytes Relative: 10 %
Lymphs Abs: 1 10*3/uL (ref 0.7–4.0)
MCH: 26.5 pg (ref 26.0–34.0)
MCHC: 30.7 g/dL (ref 30.0–36.0)
MCV: 86.2 fL (ref 80.0–100.0)
Monocytes Absolute: 1.3 10*3/uL — ABNORMAL HIGH (ref 0.1–1.0)
Monocytes Relative: 12 %
Neutro Abs: 8 10*3/uL — ABNORMAL HIGH (ref 1.7–7.7)
Neutrophils Relative %: 72 %
Platelets: 298 10*3/uL (ref 150–400)
RBC: 2.83 MIL/uL — ABNORMAL LOW (ref 4.22–5.81)
RDW: 18.3 % — ABNORMAL HIGH (ref 11.5–15.5)
WBC: 10.9 10*3/uL — ABNORMAL HIGH (ref 4.0–10.5)
nRBC: 0 % (ref 0.0–0.2)

## 2019-08-18 LAB — GLUCOSE, CAPILLARY
Glucose-Capillary: 333 mg/dL — ABNORMAL HIGH (ref 70–99)
Glucose-Capillary: 347 mg/dL — ABNORMAL HIGH (ref 70–99)
Glucose-Capillary: 274 mg/dL — ABNORMAL HIGH (ref 70–99)

## 2019-08-18 LAB — PROTIME-INR
INR: 1.5 — ABNORMAL HIGH (ref 0.8–1.2)
Prothrombin Time: 17.8 seconds — ABNORMAL HIGH (ref 11.4–15.2)

## 2019-08-18 LAB — HEPARIN LEVEL (UNFRACTIONATED): Heparin Unfractionated: 0.39 IU/mL (ref 0.30–0.70)

## 2019-08-18 MED ORDER — ENSURE MAX PROTEIN PO LIQD
11.0000 [oz_av] | Freq: Every day | ORAL | Status: DC
  Administered 2019-08-18 – 2019-09-14 (×28): 11 [oz_av] via ORAL
  Filled 2019-08-18 (×29): qty 330

## 2019-08-18 MED ORDER — INSULIN GLARGINE 100 UNIT/ML ~~LOC~~ SOLN
44.0000 [IU] | Freq: Two times a day (BID) | SUBCUTANEOUS | Status: DC
  Administered 2019-08-18 – 2019-08-29 (×21): 44 [IU] via SUBCUTANEOUS
  Filled 2019-08-18 (×23): qty 0.44

## 2019-08-18 MED ORDER — SODIUM CHLORIDE 0.9 % IV SOLN
INTRAVENOUS | Status: AC
  Administered 2019-08-18: 18:00:00 via INTRAVENOUS

## 2019-08-18 MED ORDER — BETHANECHOL CHLORIDE 10 MG PO TABS
10.0000 mg | ORAL_TABLET | Freq: Four times a day (QID) | ORAL | Status: DC
  Administered 2019-08-18 – 2019-09-15 (×113): 10 mg via ORAL
  Filled 2019-08-18 (×115): qty 1

## 2019-08-18 MED ORDER — ACETAMINOPHEN 325 MG PO TABS: 325.0000 mg | ORAL_TABLET | ORAL | Status: AC | PRN

## 2019-08-18 MED ORDER — WARFARIN SODIUM 7.5 MG PO TABS
12.5000 mg | ORAL_TABLET | Freq: Once | ORAL | Status: AC
  Administered 2019-08-18: 18:00:00 12.5 mg via ORAL
  Filled 2019-08-18: qty 1

## 2019-08-18 MED ORDER — NALOXONE HCL 0.4 MG/ML IJ SOLN
INTRAMUSCULAR | Status: AC
  Administered 2019-08-18: 0.4 mg
  Filled 2019-08-18: qty 1

## 2019-08-18 NOTE — Significant Event (Addendum)
Rapid Response Event Note  Overview: Neurologic - AMS/Confusion/Decreased LOC  Initial Focused Assessment: Called by nursing staff with concerns of patient being increasing lethargic as the day has gone on, LSN - unknown. Patient was working with PT this afternoon and PT also stated that patient could keep his eyes, couldn't lift his RUE up for a BP cuff to be applied.   Upon arrival, [name] was alert but only oriented to place/situation, disoriented to his age and birth date, his speech was slurred, RUE and RLE weakness, he moved his LLE (AKA) better than his RLE, + ataxia, he could not recollect events or people (like where he is from or his wife's name). Per multiple staff members, patient is normally alert/oriented x 4, very social/interactive and this is not his baseline. Lung sounds clear, skin warm and dry, VSS, and blood sugar normal. I spoke with Dr. Dagoberto Ligas, updated her, per her, patient was lethargic this morning but did not have the motor deficits. I asked to initiate a Code Stroke, and she agreed. NIH 6  Interventions: -- Heparin Infusion stopped -- Narcan 0.4mg  IV x 1 -- Code Stroke called at 1724 -- STAT CT HEAD   Plan of Care:  -- CTH - negative for hemorrhage, resume ATC -- TIA workup - neuro checks Q2H x 12 hours  -- NS 100 cc/hr (SBP 100-130s) - patient has not been awake to eat/drink -- Passed swallow evaluation.  -- No CTA due to worsening CR, also has mechanical valve - no MRI.  -- I called Dr. Dagoberto Ligas and discussed the plan of care with her and the nursing staff.  Patient improved, RUE/RLE were not as weak but he was still having trouble with getting his thoughts out.  Please call RRRN for assistance, worsening neurological status. Monitor respiratory status, given EF 35% and patient is on Demandex BID, will hold current dose and nurse to evaluate patient prior to next dose. Should the patient have respiratory distress or compromise, call RRRN and provider for  assistance. Monitor UOP if possible.   Event Summary:  Call Time 1703 Arrival Time [id number] End Time 1805   Tramond Slinker R

## 2019-08-18 NOTE — Progress Notes (Signed)
Orthopedic Tech Progress Note Patient Details:  TANNOR PRETORIUS 1951/05/16 [id number]  Patient ID: [name], male   DOB: Jun 22, 1951, 68 y.o.   MRN: [id number]   [place] Pink 08/18/2019, 10:31 AMCalled [facility] for left stump shrinnker.

## 2019-08-18 NOTE — Progress Notes (Signed)
Physical Therapy Session Note  Patient Details  Name: Joseph Hoffman MRN: [id number] Date of Birth: May 26, 1951  Today's Date: 08/18/2019 PT Individual Time: [id number] PT Individual Time Calculation (min): 23 min   Short Term Goals: Week 1:  PT Short Term Goal 1 (Week 1): Patient will perform bed mobility with min A. PT Short Term Goal 2 (Week 1): Pt will perform bed<>chair transfer w/ mod assist +2 for safety. PT Short Term Goal 3 (Week 1): Pt will tolerate sitting OOB in between therapy sessions for 1 hour. PT Short Term Goal 4 (Week 1): Pt will self-propel w/c 73' w/ supervision PT Short Term Goal 5 (Week 1): Patient will initiate standing with mod A +2 using LRAD.  Skilled Therapeutic Interventions/Progress Updates:   Therapist arrived to patient's room to make up missed therapy time. Pt received supine in bed, asleep with RN present and reporting pt had been lethargic all day. Pt difficult to arouse requiring increased stimulus and still with heavy eye lids. Pt able to respond "lets give it a try" to question regarding participation in therapy. Assessed vitals supine in R UE: BP 118/44 (MAP 62), HR 53bpm with pt noticed to have difficulty holding up his R arm for therapist to place BP cuff. During this time pt continued to have heavy eyelids but with delayed response was able to appropriately answer that his wife's name is "[name]" and that their "49th wedding anniversary is on 9/16" Pt agreeable to attempt sitting EOB. Supine>L sidelying>sitting EOB with pt able to use R UE on bedrail to assist with rolling and sitting upright with max assist from therapist for trunk upright. Upon sitting on EOB pt's eyes closed and he had open mouth posturing requiring loud verbal stimulus and sternal rub to open eyes and respond to therapist's questions - RN entering room and calling Rapid Response. Assessed vitals sitting EOB: BP 113/78, HR 67bpm. Pt continues to have open mouth posturing while sitting on EOB  with mod assist for trunk control but with increasing engagement in conversation but continued slow response to questioning and some inappropriate responses such as "where did you take that pillow" in response to questions regarding his wife's name and where they live. Siit>supine with +2 (for safety) max assist for trunk descent and LE management. Rapid response provider, Puja, entering and assuming care of patient with therapist providing hand-off details of the above information to her.  Therapy Documentation Precautions:  Precautions Precautions: Fall Precaution Comments: monitor HR, sats and BP Restrictions Weight Bearing Restrictions: Yes LLE Weight Bearing: Non weight bearing Other Position/Activity Restrictions: L AKA - in ace wrap.  no drainage seen through wrap  Vital Signs: Details above.  Therapy/Group: Individual Therapy  [name], PT, DPT 08/18/2019, 5:37 PM

## 2019-08-18 NOTE — Progress Notes (Signed)
Elease Hashimoto, LCSW  Social Worker  Physical Medicine and Rehabilitation  Progress Notes  Signed  Date of Service:  08/06/2019 10:29 AM      Related encounter: Admission (Discharged) from 08/05/2019 in [facility] [facility]      Signed        Show:Clear all [x] Manual[x] Template[] Copied  Added by: [x] Elease Hashimoto, LCSW  [] Hover for details Social Work Assessment and Plan    Patient Details  Name: Joseph Hoffman MRN: [id number] Date of Birth: 09/03/1951   Today's Date: 08/06/2019   Problem List:      Patient Active Problem List    Diagnosis Date Noted   Unilateral complete BKA, left, initial encounter ([place]) 08/05/2019   Leukocytosis     Acute on chronic anemia     Post-operative pain     Nausea & vomiting 07/29/2019   Diarrhea 07/29/2019   Diabetic ulcer of left foot associated with diabetes mellitus due to underlying condition ([place]) 07/29/2019   Left ventricular ejection fraction of 30% to 35% 07/29/2019   PAD (peripheral artery disease) ([place]) 07/28/2019   Long term (current) use of anticoagulants 07/13/2019   CAD, multiple vessel 07/10/2019   Aortic valve regurgitation     Aortic valve disease     Foot ulcer, left ([place]) 06/26/2019   Depression 05/28/2019   Congestive heart failure (CHF) ([place]) 05/13/2019   H/O heart valve replacement with mechanical valve     Type II diabetes mellitus with renal manifestations ([place]) 05/12/2019   Chest pain 05/12/2019   Elevated troponin 05/12/2019   CKD (chronic kidney disease), stage III ([place]) 05/12/2019   Acute on chronic systolic (congestive) heart failure ([place]) 05/12/2019   CRI (chronic renal insufficiency), stage 3 (moderate) ([place]) 04/09/2019   Acute combined systolic and diastolic heart failure ([place]) 04/09/2019   Essential hypertension 06/03/2017   Dyslipidemia, goal LDL below 70 04/11/2017   AS (aortic stenosis) 10/28/2012   Acute on chronic systolic heart  failure, re-admitted 10/12/12 10/12/2012   S/P AVR, 09/29/12, [employer]. (discharged 10/05/12) 09/30/2012   Permanent atrial fibrillation, since 1994 09/30/2012   Type 2 IDDM 09/30/2012   OSA on CPAP 09/30/2012   Normal coronary arteries at cath Oct 2013 09/30/2012   Chronic anticoagulation 09/30/2012   Severe aortic stenosis 09/25/2012   Past Medical History:      Past Medical History:  Diagnosis Date   Anxiety     Aortic stenosis     Arthritis     Asthma     Asymptomatic LV dysfunction, EF 45%, normal coronary arteries on cardiac cath 09/18/12 09/30/2012   CHF (congestive heart failure) (HCC)     Chronic anticoagulation, on Xarelto prior to admit 09/30/2012   DM (diabetes mellitus) ([place]) 09/30/2012   Hepatitis B 2003   Hypertension     Myocardial infarction [facility])     Permanent atrial fibrillation, since 1994 09/30/2012    stress test 02/08/12- normal study, no significant ischemia   S/P AVR (aortic valve replacement), 09/30/2012    a. s/p mechcanical AVR in 09/2012 (on Coumadin)   Sleep apnea      uses CPAP   Past Surgical History:       Past Surgical History:  Procedure Laterality Date   ABDOMINAL ANGIOGRAM   09/18/2012    Procedure: ABDOMINAL ANGIOGRAM;  Surgeon: Troy Sine, MD;  Location: [facility] CATH LAB;  Service: Cardiovascular;;   ABDOMINAL AORTOGRAM W/LOWER EXTREMITY N/A 07/02/2019    Procedure: ABDOMINAL AORTOGRAM  W/LOWER EXTREMITY;  Surgeon: Marty Heck, MD;  Location: [place] CV LAB;  Service: Cardiovascular;  Laterality: N/A;   AMPUTATION Left 07/03/2019    Procedure: AMPUTATION LEFT GREAT TOE;  Surgeon: Marty Heck, MD;  Location: [place];  Service: Vascular;  Laterality: Left;   AMPUTATION Left 07/31/2019    Procedure: AMPUTATION BELOW KNEE LEFT;  Surgeon: Marty Heck, MD;  Location: [place];  Service: Vascular;  Laterality: Left;   AORTIC VALVE REPLACEMENT   09/29/2012    Procedure: AORTIC VALVE REPLACEMENT  (AVR);  Surgeon: Gaye Pollack, MD;  Location: [place];  Service: Open Heart Surgery;  Laterality: N/A;   ARCH AORTOGRAM   09/18/2012    Procedure: ARCH AORTOGRAM;  Surgeon: Troy Sine, MD;  Location: [facility] CATH LAB;  Service: Cardiovascular;;   CARDIAC CATHETERIZATION   09/18/12    severe calcific aortic stenosis, peak gradient 56mm, mean gradient 21mm, EF 45%   CARDIAC VALVE REPLACEMENT        AVR 09-29-12   CHOLECYSTECTOMY       FRACTURE SURGERY       LEFT AND RIGHT HEART CATHETERIZATION WITH CORONARY ANGIOGRAM N/A 09/18/2012    Procedure: LEFT AND RIGHT HEART CATHETERIZATION WITH CORONARY ANGIOGRAM;  Surgeon: Troy Sine, MD;  Location: [facility] CATH LAB;  Service: Cardiovascular;  Laterality: N/A;   PERIPHERAL VASCULAR INTERVENTION Left 07/02/2019    Procedure: PERIPHERAL VASCULAR INTERVENTION;  Surgeon: Marty Heck, MD;  Location: [place] CV LAB;  Service: Cardiovascular;  Laterality: Left;   RIGHT HEART CATH AND CORONARY ANGIOGRAPHY N/A 06/29/2019    Procedure: RIGHT HEART CATH AND CORONARY ANGIOGRAPHY;  Surgeon: Nelva Bush, MD;  Location: [place] CV LAB;  Service: Cardiovascular;  Laterality: N/A;   TEE WITHOUT CARDIOVERSION N/A 05/29/2019    Procedure: TRANSESOPHAGEAL ECHOCARDIOGRAM (TEE);  Surgeon: Lelon Perla, MD;  Location: [facility] ENDOSCOPY;  Service: Cardiovascular;  Laterality: N/A;   Social History:  reports that he quit smoking about 36 years ago. His smoking use included cigarettes. He has a 2.50 pack-year smoking history. He has quit using smokeless tobacco. He reports current alcohol use. He reports that he does not use drugs.   Family / Support Systems Marital Status: Married Patient Roles: Spouse, Parent Spouse/Significant Other: [name] [phone]  [phone] Children: Dan-son [phone] Other Supports: Daughter in [place] and son in [place] Anticipated Caregiver: Self- wife has health issues Ability/Limitations of Caregiver: wife can  do supervision only due to health issues and son lives up stairs and works Building control surveyor Availability: 24/7 Family Dynamics: Close knit with family, have some friends who are supportive. Pt voiced he was suppose to help his wife and not be the one who is ill and needs assist. Their son may be able to assist some-per pt he will help his Mom more than he.   Social History Preferred language: English Religion: Catholic Cultural Background: No issues Education: Secretary/administrator educated Read: Yes Write: Yes Employment Status: Retired Public relations account executive Issues: No issues Guardian/Conservator: None-according to MD pt is capable of making his own decisions while here.    Abuse/Neglect Abuse/Neglect Assessment Can Be Completed: Yes Physical Abuse: Denies Verbal Abuse: Denies Sexual Abuse: Denies Exploitation of patient/patient's resources: Denies Self-Neglect: Denies   Emotional Status Pt's affect, behavior and adjustment status: Pt is motivated to recover and get back to his independent level. He had recovered from his toe being amputated 07/03/2019. He has always been independent and taken care of himself and plans to again.  He does not to burden his wife who is dealing with her own health issues. Recent Psychosocial Issues: other health issues-feels his fault due to not managing his diabetes Psychiatric History: No history-do feel he would benefit from seeing neuro-psych while here due to feels responsible for his amputation. He would benefit from talking to someone while here and with his wife's health issues. Substance Abuse History: No issues   Patient / Family Perceptions, Expectations & Goals Pt/Family understanding of illness & functional limitations: Pt and wife can explain his amputation and treatment plan going forward. he is not one who shy always from asking his questions and wanting the answers even if it is something he doesn't want to hear. Premorbid pt/family roles/activities:  Husband, father, retiree, friend, etc Anticipated changes in roles/activities/participation: resume Pt/family expectations/goals: Pt states: " I plan on being able to do for myself when I leave here."  Wife states: " I will do what I can but am limited myself."   [employer]: Other (Comment) Premorbid Home Care/DME Agencies: Other (Comment)([facility] was following prior to admission and rw) Transportation available at discharge: Wife Resource referrals recommended: Neuropsychology, Support group (specify)   Discharge Planning Living Arrangements: Spouse/significant other, Children Support Systems: Spouse/significant other, Children, Friends/neighbors Type of Residence: Private residence Insurance Resources: [employer], Multimedia programmer (specify)([employer]) Museum/gallery curator Resources: Fish farm manager, Family Support Financial Screen Referred: No Living Expenses: Own Money Management: Patient, Spouse Does the patient have any problems obtaining your medications?: No Home Management: Both he and wife when able Patient/Family Preliminary Plans: Return home with wife who can be there but not really provide assist due to her own health issues. Will await therapy team evaluations and will work on discharge needs. Will make neuro-psych referral for coping. Social Work Anticipated Follow Up Needs: HH/OP, Support Group   Clinical Impression Pleasant very talkative gentleman who is motivated to do well here and get back to his independent level. His wife has her own health issues and can only provide supervision level. Their son will help wife but pt feels will not assist him. Will await evaluations and work on discharge needs. Will make neuro-psych referral.   Elease Hashimoto 08/06/2019, 10:29 AM             Staphanie Harbison, Gardiner Rhyme, LCSW  Social Worker  Physical Medicine and Rehabilitation  Progress Notes  Signed  Date of Service:  08/06/2019 10:29 AM      Related encounter:  Admission (Discharged) from 08/05/2019 in [place] [facility]      Signed        Show:Clear all [x] Manual[x] Template[] Copied  Added by: [x] Trinia Georgi, Gardiner Rhyme, LCSW  [] Hover for details Social Work Assessment and Plan    Patient Details  Name: Joseph Hoffman MRN: [id number] Date of Birth: [birth year]   Today's Date: 08/06/2019   Problem List:      Patient Active Problem List    Diagnosis Date Noted   Unilateral complete BKA, left, initial encounter ([place]) 08/05/2019   Leukocytosis     Acute on chronic anemia     Post-operative pain     Nausea & vomiting 07/29/2019   Diarrhea 07/29/2019   Diabetic ulcer of left foot associated with diabetes mellitus due to underlying condition ([place]) 07/29/2019   Left ventricular ejection fraction of 30% to 35% 07/29/2019   PAD (peripheral artery disease) ([place]) 07/28/2019   Long term (current) use of anticoagulants 07/13/2019   CAD, multiple vessel 07/10/2019   Aortic  valve regurgitation     Aortic valve disease     Foot ulcer, left ([place]) 06/26/2019   Depression 05/28/2019   Congestive heart failure (CHF) ([place]) 05/13/2019   H/O heart valve replacement with mechanical valve     Type II diabetes mellitus with renal manifestations ([place]) 05/12/2019   Chest pain 05/12/2019   Elevated troponin 05/12/2019   CKD (chronic kidney disease), stage III ([place]) 05/12/2019   Acute on chronic systolic (congestive) heart failure ([place]) 05/12/2019   CRI (chronic renal insufficiency), stage 3 (moderate) ([place]) 04/09/2019   Acute combined systolic and diastolic heart failure ([place]) 04/09/2019   Essential hypertension 06/03/2017   Dyslipidemia, goal LDL below 70 04/11/2017   AS (aortic stenosis) 10/28/2012   Acute on chronic systolic heart failure, re-admitted 10/12/12 10/12/2012   S/P AVR, 09/29/12, [employer] [facility] (discharged 10/05/12) 09/30/2012   Permanent atrial fibrillation, since 1994 09/30/2012   Type 2  IDDM 09/30/2012   OSA on CPAP 09/30/2012   Normal coronary arteries at cath Oct 2013 09/30/2012   Chronic anticoagulation 09/30/2012   Severe aortic stenosis 09/25/2012   Past Medical History:      Past Medical History:  Diagnosis Date   Anxiety     Aortic stenosis     Arthritis     Asthma     Asymptomatic LV dysfunction, EF 45%, normal coronary arteries on cardiac cath 09/18/12 09/30/2012   CHF (congestive heart failure) (HCC)     Chronic anticoagulation, on Xarelto prior to admit 09/30/2012   DM (diabetes mellitus) ([place]) 09/30/2012   Hepatitis B 2003   Hypertension     Myocardial infarction [facility])     Permanent atrial fibrillation, since 1994 09/30/2012    stress test 02/08/12- normal study, no significant ischemia   S/P AVR (aortic valve replacement), 09/30/2012    a. s/p mechcanical AVR in 09/2012 (on Coumadin)   Sleep apnea      uses CPAP   Past Surgical History:       Past Surgical History:  Procedure Laterality Date   ABDOMINAL ANGIOGRAM   09/18/2012    Procedure: ABDOMINAL ANGIOGRAM;  Surgeon: Troy Sine, MD;  Location: [facility] CATH LAB;  Service: Cardiovascular;;   ABDOMINAL AORTOGRAM W/LOWER EXTREMITY N/A 07/02/2019    Procedure: ABDOMINAL AORTOGRAM W/LOWER EXTREMITY;  Surgeon: Marty Heck, MD;  Location: [place] CV LAB;  Service: Cardiovascular;  Laterality: N/A;   AMPUTATION Left 07/03/2019    Procedure: AMPUTATION LEFT GREAT TOE;  Surgeon: Marty Heck, MD;  Location: [place];  Service: Vascular;  Laterality: Left;   AMPUTATION Left 07/31/2019    Procedure: AMPUTATION BELOW KNEE LEFT;  Surgeon: Marty Heck, MD;  Location: [place];  Service: Vascular;  Laterality: Left;   AORTIC VALVE REPLACEMENT   09/29/2012    Procedure: AORTIC VALVE REPLACEMENT (AVR);  Surgeon: Gaye Pollack, MD;  Location: [place];  Service: Open Heart Surgery;  Laterality: N/A;   ARCH AORTOGRAM   09/18/2012    Procedure: ARCH AORTOGRAM;  Surgeon:  Troy Sine, MD;  Location: [facility] CATH LAB;  Service: Cardiovascular;;   CARDIAC CATHETERIZATION   09/18/12    severe calcific aortic stenosis, peak gradient 41mm, mean gradient 60mm, EF 45%   CARDIAC VALVE REPLACEMENT        AVR 09-29-12   CHOLECYSTECTOMY       FRACTURE SURGERY       LEFT AND RIGHT HEART CATHETERIZATION WITH CORONARY ANGIOGRAM N/A 09/18/2012    Procedure: LEFT AND RIGHT  HEART CATHETERIZATION WITH CORONARY ANGIOGRAM;  Surgeon: Troy Sine, MD;  Location: [facility] CATH LAB;  Service: Cardiovascular;  Laterality: N/A;   PERIPHERAL VASCULAR INTERVENTION Left 07/02/2019    Procedure: PERIPHERAL VASCULAR INTERVENTION;  Surgeon: Marty Heck, MD;  Location: [place] CV LAB;  Service: Cardiovascular;  Laterality: Left;   RIGHT HEART CATH AND CORONARY ANGIOGRAPHY N/A 06/29/2019    Procedure: RIGHT HEART CATH AND CORONARY ANGIOGRAPHY;  Surgeon: Nelva Bush, MD;  Location: [place] CV LAB;  Service: Cardiovascular;  Laterality: N/A;   TEE WITHOUT CARDIOVERSION N/A 05/29/2019    Procedure: TRANSESOPHAGEAL ECHOCARDIOGRAM (TEE);  Surgeon: Lelon Perla, MD;  Location[facility] ENDOSCOPY;  Service: Cardiovascular;  Laterality: N/A;   Social History:  reports that he quit smoking about 36 years ago. His smoking use included cigarettes. He has a 2.50 pack-year smoking history. He has quit using smokeless tobacco. He reports current alcohol use. He reports that he does not use drugs.   Family / Support Systems Marital Status: Married Patient Roles: Spouse, Parent Spouse/Significant Other: [name] [phone]  [phone] Children: Dan-son [phone]-cell Other Supports: Daughter in [place] and son in [place] Anticipated Caregiver: Self- wife has health issues Ability/Limitations of Caregiver: wife can do supervision only due to health issues and son lives up stairs and works Building control surveyor Availability: 24/7 Family Dynamics: Close knit with family, have some friends who are  supportive. Pt voiced he was suppose to help his wife and not be the one who is ill and needs assist. Their son may be able to assist some-per pt he will help his Mom more than he.   Social History Preferred language: English Religion: Catholic Cultural Background: No issues Education: Secretary/administrator educated Read: Yes Write: Yes Employment Status: Retired Public relations account executive Issues: No issues Guardian/Conservator: None-according to MD pt is capable of making his own decisions while here.    Abuse/Neglect Abuse/Neglect Assessment Can Be Completed: Yes Physical Abuse: Denies Verbal Abuse: Denies Sexual Abuse: Denies Exploitation of patient/patient's resources: Denies Self-Neglect: Denies   Emotional Status Pt's affect, behavior and adjustment status: Pt is motivated to recover and get back to his independent level. He had recovered from his toe being amputated 07/03/2019. He has always been independent and taken care of himself and plans to again. He does not to burden his wife who is dealing with her own health issues. Recent Psychosocial Issues: other health issues-feels his fault due to not managing his diabetes Psychiatric History: No history-do feel he would benefit from seeing neuro-psych while here due to feels responsible for his amputation. He would benefit from talking to someone while here and with his wife's health issues. Substance Abuse History: No issues   Patient / Family Perceptions, Expectations & Goals Pt/Family understanding of illness & functional limitations: Pt and wife can explain his amputation and treatment plan going forward. he is not one who shy always from asking his questions and wanting the answers even if it is something he doesn't want to hear. Premorbid pt/family roles/activities: Husband, father, retiree, friend, etc Anticipated changes in roles/activities/participation: resume Pt/family expectations/goals: Pt states: " I plan on being able to do for  myself when I leave here."  Wife states: " I will do what I can but am limited myself."   [employer]: Other (Comment) Premorbid Home Care/DME Agencies: Other (Comment)([facility] was following prior to admission and rw) Transportation available at discharge: Wife Resource referrals recommended: Neuropsychology, Support group (specify)   Discharge Planning Living Arrangements: Spouse/significant other, Children  Support Systems: Spouse/significant other, Children, Friends/neighbors Type of Residence: Private residence Insurance Resources: [employer], Multimedia programmer (specify)([employer]) Financial Resources: Social Security, Family Support Financial Screen Referred: No Living Expenses: Own Money Management: Patient, Spouse Does the patient have any problems obtaining your medications?: No Home Management: Both he and wife when able Patient/Family Preliminary Plans: Return home with wife who can be there but not really provide assist due to her own health issues. Will await therapy team evaluations and will work on discharge needs. Will make neuro-psych referral for coping. Social Work Anticipated Follow Up Needs: HH/OP, Support Group   Clinical Impression Pleasant very talkative gentleman who is motivated to do well here and get back to his independent level. His wife has her own health issues and can only provide supervision level. Their son will help wife but pt feels will not assist him. Will await evaluations and work on discharge needs. Will make neuro-psych referral.   Elease Hashimoto 08/06/2019, 10:29 AM

## 2019-08-18 NOTE — Progress Notes (Signed)
ANTICOAGULATION CONSULT NOTE - Follow Up Consult  Pharmacy Consult for Hep/Coum Indication: Mechanical valve  Allergies  Allergen Reactions  . Iohexol Anaphylaxis  . Niacin And Related     Flushing with immediate realese  . Penicillins Other (See Comments)    Unknown.[name]  Did it involve swelling of the face/tongue/throat, SOB, or low BP? Unknown Did it involve sudden or severe rash/hives, skin peeling, or any reaction on the inside of your mouth or nose? Unknown Did you need to seek medical attention at a hospital or doctor's office? Unknown When did it last happen?Childhood If all above answers are "NO", may proceed with cephalosporin use.    Patient Measurements: Weight: 223 lb 5.2 oz (101.3 kg) Heparin Dosing Weight:   Vital Signs: Temp: 98.3 F (36.8 C) (09/15 0207) Temp Source: Oral (09/15 0207) BP: 140/70 (09/15 0207) Pulse Rate: 78 (09/15 0207)  Labs: Recent Labs    08/16/19 0231  08/17/19 0555 08/17/19 1851 08/18/19 0333  HGB 8.8*  --  8.7*  --  7.5*  HCT 27.8*  --  27.8*  --  24.4*  PLT 316  --  356  --  298  LABPROT 15.2  --  16.5*  --  17.8*  INR 1.2  --  1.4*  --  1.5*  HEPARINUNFRC 0.69   < > 0.40 0.31 0.39  CREATININE  --   --   --  1.59* 1.72*   < > = values in this interval not displayed.    Estimated Creatinine Clearance: 48.4 mL/min (A) (by C-G formula based on SCr of 1.72 mg/dL (H)).  Assessment: 68 year old male on warfarin for mechanical AVR who received INR reversal (Vitamin K 5mg  IV) on 9/9 for an AKA. Patient is on IV Heparin per pharmacy dosing consult and Warfarin was resumed per pharmacy dosing on 9/12. PTA warfarin dose - 10 mg daily except 12.5mg  TTS, admit INR 2.7 (goal 2.5-3.5)  Today: INR today is 1.5 (sub-therapeutic but increasing slowly).  Heparin level remains therapeutic at 0.39 on 1500 units/hr. No bleeding reported.  Hgb dropped some today from 8.7 to 7.5. Platelets are stable.  Note patient  initiated on Doxycycline 9/14 which may interact with Warfarin resulting in elevated INR - will monitor closely for rapid increases.   Goal of Therapy:  INR 2.5-3.5 Monitor platelets by anticoagulation protocol: Yes   Plan:  Continue IV heparin 1500 units/hr Warfarin 12.5mg  x 1 tonight Daily HL, CBC, and INR Bridging with IV heparin until INR >2.5  Sloan Leiter, PharmD, BCPS, BCCCP Clinical Pharmacist Please refer to [facility] for [place] numbers 08/18/2019,7:24 AM

## 2019-08-18 NOTE — Care Management Note (Signed)
[facility] Individual Statement of Services  Patient Name:  Joseph Hoffman  Date:  08/18/2019  Welcome to the [facility].  Our goal is to provide you with an individualized program based on your diagnosis and situation, designed to meet your specific needs.  With this comprehensive rehabilitation program, you will be expected to participate in at least 3 hours of rehabilitation therapies Monday-Friday, with modified therapy programming on the weekends.  Your rehabilitation program will include the following services:  Physical Therapy (PT), Occupational Therapy (OT), 24 hour per day rehabilitation nursing, Neuropsychology, Case Management (Social Worker), Rehabilitation Medicine, Nutrition Services and Pharmacy Services  Weekly team conferences will be held on Wednesday to discuss your progress.  Your Social Worker will talk with you frequently to get your input and to update you on team discussions.  Team conferences with you and your family in attendance may also be held.  Expected length of stay: 17-21 days  Overall anticipated outcome: independent with device  Depending on your progress and recovery, your program may change. Your Social Worker will coordinate services and will keep you informed of any changes. Your Social Worker's name and contact numbers are listed  below.  The following services may also be recommended but are not provided by the [facility] will be made to provide these services after discharge if needed.  Arrangements include referral to agencies that provide these services.  Your insurance has been verified to be:  Medicare & [employer] Your primary doctor is:  Orpah Melter  Pertinent information will be shared with your doctor and your insurance company.  Social Worker:  Ovidio Kin, [place] or ([phone]  Information discussed with and copy given to patient by: Elease Hashimoto, 08/18/2019, 9:05 AM

## 2019-08-18 NOTE — Progress Notes (Signed)
[facility] PHYSICAL MEDICINE & REHABILITATION PROGRESS NOTE   Subjective/Complaints:  Pt asleep after receiving pain meds- per PT in room, pt was rating pain 12/10 but came down with meds he received during PT/therapy- then fell asleep.  BP was laying 102/76 and sitting 97/77- so not orthostatic however c/o dizziness and lightheadedness when sitting x 4 minutes at EOB.    ROS- unable to determine today because pt asleep- refused to wake up to stimuli  Objective:   No results found. Recent Labs    08/17/19 0555 08/18/19 0333  WBC 11.1* 10.9*  HGB 8.7* 7.5*  HCT 27.8* 24.4*  PLT 356 298   Recent Labs    08/17/19 1851 08/18/19 0333  NA 133* 132*  K 4.6 4.7  CL 98 95*  CO2 25 26  GLUCOSE 263* 373*  BUN 43* 47*  CREATININE 1.59* 1.72*  CALCIUM 8.8* 8.8*    Intake/Output Summary (Last 24 hours) at 08/18/2019 1411 Last data filed at 08/18/2019 0900 Gross per 24 hour  Intake 611.62 ml  Output 2750 ml  Net -2138.38 ml     Physical Exam: Vital Signs Blood pressure (!) 100/43, pulse 84, temperature 98.3 F (36.8 C), temperature source Oral, resp. rate 15, weight 101.3 kg, SpO2 97 %.  Nursing noteand vitalsreviewed. Constitutional: asleep, was awake, alert and appropriate for PT, but wouldn't wake to verbal/light tactile stimuli by MD, NAD currently, snoring HENT:  Head:Normocephalicand atraumatic.  Eyes:EOMare normal.  Neck:Normal range of motion.No tracheal deviationpresent. No thyromegalypresent.  Cardiovascular:Normal rate.An irregularly irregular rhythmpresent.  No murmurheard. +click Respiratory:Effort normal. Norespiratory distress. He hasno wheezes.  NX:[id number]. NT, ND, (+)BS  Musculoskeletal:  General: Tendernessand edema(L-AKA with moderate edema)present.  Neurological: He isalertand oriented to person, place, and time (while awake). UE 5/6. RLE 4/5 prox to distal. Able to lift left leg against gravity. No sensory  deficits Skin: Skin iswarm. Incision with staples intact--still draining. Psychiatric: He has a normal mood and affect. Hisbehavior is normal.Judgmentand thought contentnormal.     Assessment/Plan: 1. Functional deficits secondary to new L AKA due to ischemia/gangrene which require 3+ hours per day of interdisciplinary therapy in a comprehensive inpatient rehab setting.  Physiatrist is providing close team supervision and 24 hour management of active medical problems listed below.  Physiatrist and rehab team continue to assess barriers to discharge/monitor patient progress toward functional and medical goals  Care Tool:  Bathing    Body parts bathed by patient: Right arm, Left arm, Chest, Abdomen, Front perineal area, Right upper leg, Left upper leg, Face   Body parts bathed by helper: Buttocks, Right lower leg Body parts n/a: Left lower leg   Bathing assist Assist Level: Moderate Assistance - Patient 50 - 74%     Upper Body Dressing/Undressing Upper body dressing   What is the patient wearing?: Hospital gown only    Upper body assist Assist Level: Minimal Assistance - Patient > 75%    Lower Body Dressing/Undressing Lower body dressing      What is the patient wearing?: Hospital gown only     Lower body assist Assist for lower body dressing: Minimal Assistance - Patient > 75%     Toileting Toileting    Toileting assist Assist for toileting: Minimal Assistance - Patient > 75%(urinal only)     Transfers Chair/bed transfer  Transfers assist  Chair/bed transfer activity did not occur: Safety/medical concerns        Locomotion Ambulation   Ambulation assist   Ambulation activity did not occur:  Safety/medical concerns(labial BP and HR sitting EOB.)          Walk 10 feet activity   Assist  Walk 10 feet activity did not occur: Safety/medical concerns(labial BP and HR sitting EOB.)        Walk 50 feet activity   Assist Walk 50 feet with  2 turns activity did not occur: Safety/medical concerns(labial BP and HR sitting EOB.)         Walk 150 feet activity   Assist Walk 150 feet activity did not occur: Safety/medical concerns(labial BP and HR sitting EOB.)         Walk 10 feet on uneven surface  activity   Assist Walk 10 feet on uneven surfaces activity did not occur: Safety/medical concerns(labial BP and HR sitting EOB.)         Wheelchair     Assist     Wheelchair activity did not occur: Safety/medical concerns(labial BP and HR sitting EOB.)         Wheelchair 50 feet with 2 turns activity    Assist    Wheelchair 50 feet with 2 turns activity did not occur: Safety/medical concerns(labial BP and HR sitting EOB.)       Wheelchair 150 feet activity     Assist  Wheelchair 150 feet activity did not occur: Safety/medical concerns(labial BP and HR sitting EOB.)       Blood pressure (!) 100/43, pulse 84, temperature 98.3 F (36.8 C), temperature source Oral, resp. rate 15, weight 101.3 kg, SpO2 97 %.  Medical Problem List and Plan: 1.Fxnl and mobility deficitssecondary to PAD and left AKA -admit to inpatient rehab 2.Mechanical AVR/Antithrombotics: -DVT/anticoagulation:Pharmaceutical:Coumadin and Heparin gtttill INR> 2.5. -antiplatelet therapy: N/A  9/15- INR 1.5- on Heparin gtt until INR 2.5- pharmacy monitoring daily.  Hb dropped from 8.7 to 7.5- daily checks -might need transfusion.  3. Pain Management:Still has a lot of pain--phantomas well assurgical post op pain and ongoing issues with muscle spasms.Schedule low dose flexeril tid to help with spasms.-May need neurontin titrate upwarrds. -consider scheduling oxycodone in AM prior to therapies 4. Mood:LCSW to follow for evaluation and support. -antipsychotic agents: N/A 5. Neuropsych: This patientiscapable of making decisions on hisown behalf. 6. Skin/Wound  Care:Monitor wound daily. -Increase dressing changes to bid due to amount of serosanguinous drainage that is ongoing. 7. Fluids/Electrolytes/Nutrition:Monitor I/O. Check lytes in am.  8. T2DM: Monitor BS ac/hs. Continue Lantus 40 mg bid with Victoza--resume amaryl.   9/15- increase Lantus to 44 units BID CBG (last 3)  Recent Labs    08/17/19 1611 08/17/19 2129 08/18/19 0600  GLUCAP 257* 333* 347*    9. CAD/CAF: Monitor for presyncope/syncope as back on Entresto.   9/15- lightheaded today, however BP didn't show orthostasis, just on low side.  10 Chronic systolic CHF: Heart healthy diet and monitor for signs of overload. Check weight daily. On Entresto, Demadex, metoprolol and fenofibrate--wife to bring in [place].    Filed Weights   08/18/19 0424  Weight: 101.3 kg   11. Urinary retention: Reports voiding without difficulty. Will order PVRs X 3 to monitor. 12. Constipation:Will start bowel program as this has been an issues since first surgery.   9/15- on Miralax BID and Senokot-s 2 tabs BID- monitor closely. 13. Hyponatremia: Recheck labs in am.  14. Acute on chronic anemia: H/H is stable. Rise in WBC noted--monitor for sign of infecton.   9/15- Hb 7.5 from 8.7- monitor again TH 15. OSA: CPAP when sleeping with 2 L bleed  in.  16. Acute on chronic renal failure:Trending back to baseline? Monitor for recurrent presyncope.  9/15- Cr up to 1.72 from 1.59 and BUN 47 up from 42- will encourage fluids, and monitor- recheck Thursday.    LOS: 1 days A FACE TO FACE EVALUATION WAS PERFORMED  [place] 08/18/2019, 2:11 PM

## 2019-08-18 NOTE — Evaluation (Signed)
Occupational Therapy Assessment and Plan  Patient Details  Name: Joseph Hoffman MRN: [id number] Date of Birth: 10/11/1951  OT Diagnosis: acute pain, cognitive deficits and muscle weakness (generalized) Rehab Potential: Rehab Potential (ACUTE ONLY): Good ELOS: 14-17 days   Today's Date: 08/18/2019 OT Individual Time: 9794-8016  OT Individual Time Calculation (min): 55 min   (missed 20 min due to low blood pressure and lethargy)  Problem List:  Patient Active Problem List   Diagnosis Date Noted  . Above knee amputation of left lower extremity ([place]) 08/17/2019  . Nonhealing surgical wound 08/13/2019  . Non-healing amputation site ([place]) 08/13/2019  . Type 2 diabetes mellitus with peripheral neuropathy (HCC)   . Chronic combined systolic and diastolic CHF (congestive heart failure) ([place])   . Myalgia   . Urinary retention   . Insomnia due to medical condition   . Drug-induced constipation   . Anticoagulated on Coumadin   . Unilateral complete BKA, left, initial encounter ([place]) 08/05/2019  . Leukocytosis   . Acute on chronic anemia   . Post-operative pain   . Nausea & vomiting 07/29/2019  . Diarrhea 07/29/2019  . Diabetic ulcer of left foot associated with diabetes mellitus due to underlying condition ([place]) 07/29/2019  . Left ventricular ejection fraction of 30% to 35% 07/29/2019  . PAD (peripheral artery disease) ([place]) 07/28/2019  . Long term (current) use of anticoagulants 07/13/2019  . CAD, multiple vessel 07/10/2019  . Aortic valve regurgitation   . Aortic valve disease   . Foot ulcer, left ([place]) 06/26/2019  . Depression 05/28/2019  . Congestive heart failure (CHF) ([place]) 05/13/2019  . H/O heart valve replacement with mechanical valve   . Type II diabetes mellitus with renal manifestations ([place]) 05/12/2019  . Chest pain 05/12/2019  . Elevated troponin 05/12/2019  . Stage 3 chronic kidney disease ([place]) 05/12/2019  . Acute on chronic systolic (congestive) heart failure ([place])  05/12/2019  . CRI (chronic renal insufficiency), stage 3 (moderate) ([place]) 04/09/2019  . Acute combined systolic and diastolic heart failure ([place]) 04/09/2019  . Essential hypertension 06/03/2017  . Dyslipidemia, goal LDL below 70 04/11/2017  . AS (aortic stenosis) 10/28/2012  . Acute on chronic systolic heart failure, re-admitted 10/12/12 10/12/2012  . S/P AVR, 09/29/12, [employer]. (discharged 10/05/12) 09/30/2012  . Permanent atrial fibrillation, since 1994 09/30/2012  . Type 2 IDDM 09/30/2012  . OSA on CPAP 09/30/2012  . Normal coronary arteries at cath Oct 2013 09/30/2012  . Chronic anticoagulation 09/30/2012  . Severe aortic stenosis 09/25/2012    Past Medical History:  Past Medical History:  Diagnosis Date  . Anxiety   . Aortic stenosis   . Arthritis   . Asthma   . Asymptomatic LV dysfunction, EF 45%, normal coronary arteries on cardiac cath 09/18/12 09/30/2012  . CHF (congestive heart failure) ([place])   . Chronic anticoagulation, on Xarelto prior to admit 09/30/2012  . DM (diabetes mellitus) ([place]) 09/30/2012  . Hepatitis B 2003  . Hypertension   . Myocardial infarction ([place])   . Permanent atrial fibrillation, since 1994 09/30/2012   stress test 02/08/12- normal study, no significant ischemia  . S/P AVR (aortic valve replacement), 09/30/2012   a. s/p mechcanical AVR in 09/2012 (on Coumadin)  . Sleep apnea    uses CPAP   Past Surgical History:  Past Surgical History:  Procedure Laterality Date  . ABDOMINAL ANGIOGRAM  09/18/2012   Procedure: ABDOMINAL ANGIOGRAM;  Surgeon: Troy Sine, MD;  Location: [facility] CATH LAB;  Service: Cardiovascular;;  .  ABDOMINAL AORTOGRAM W/LOWER EXTREMITY N/A 07/02/2019   Procedure: ABDOMINAL AORTOGRAM W/LOWER EXTREMITY;  Surgeon: Marty Heck, MD;  Location: [place] CV LAB;  Service: Cardiovascular;  Laterality: N/A;  . AMPUTATION Left 07/03/2019   Procedure: AMPUTATION LEFT GREAT TOE;  Surgeon: Marty Heck, MD;  Location: [place];  Service: Vascular;  Laterality: Left;  . AMPUTATION Left 07/31/2019   Procedure: AMPUTATION BELOW KNEE LEFT;  Surgeon: Marty Heck, MD;  Location: [place];  Service: Vascular;  Laterality: Left;  . AMPUTATION Left 08/13/2019   Procedure: LEFT AMPUTATION ABOVE KNEE;  Surgeon: Marty Heck, MD;  Location: [place];  Service: Vascular;  Laterality: Left;  . AORTIC VALVE REPLACEMENT  09/29/2012   Procedure: AORTIC VALVE REPLACEMENT (AVR);  Surgeon: Gaye Pollack, MD;  Location: [place];  Service: Open Heart Surgery;  Laterality: N/A;  . ARCH AORTOGRAM  09/18/2012   Procedure: ARCH AORTOGRAM;  Surgeon: Troy Sine, MD;  Location: [facility] CATH LAB;  Service: Cardiovascular;;  . CARDIAC CATHETERIZATION  09/18/12   severe calcific aortic stenosis, peak gradient 61m, mean gradient 531m EF 45%  . CARDIAC VALVE REPLACEMENT     AVR 09-29-12  . CHOLECYSTECTOMY    . FRACTURE SURGERY    . LEFT AND RIGHT HEART CATHETERIZATION WITH CORONARY ANGIOGRAM N/A 09/18/2012   Procedure: LEFT AND RIGHT HEART CATHETERIZATION WITH CORONARY ANGIOGRAM;  Surgeon: ThTroy SineMD;  [facility];  Service: Cardiovascular;  Laterality: N/A;  . PERIPHERAL VASCULAR INTERVENTION Left 07/02/2019   Procedure: PERIPHERAL VASCULAR INTERVENTION;  Surgeon: ClMarty HeckMD;  [facility];  Service: Cardiovascular;  Laterality: Left;  . RIGHT HEART CATH AND CORONARY ANGIOGRAPHY N/A 06/29/2019   Procedure: RIGHT HEART CATH AND CORONARY ANGIOGRAPHY;  Surgeon: EnNelva BushMD;  [facility];  Service: Cardiovascular;  Laterality: N/A;  . TEE WITHOUT CARDIOVERSION N/A 05/29/2019   Procedure: TRANSESOPHAGEAL ECHOCARDIOGRAM (TEE);  Surgeon: CrLelon PerlaMD;  [facility];  Service: Cardiovascular;  Laterality: N/A;    Assessment & Plan Clinical Impression: Joseph Hoffman a [age]ear old male with history of T2DM, CAD, CAF, mechanical AVR- on coumadin,  OSA-CPAP with 2 L oxygen at nights, left great toe amputation 07/03/2019 who was admitted with nausea vomiting, poor wound healing and purulent drainage.  He was started on IV antibiotics and underwent left BKA on 07/31/2019 by Dr. ClCarlis Abbott Hospital course complicated by hypotension with AKI, urinary retention, lethargy with bouts of confusion felt to be due to narcotics as well as subtherapeutic INR requiring heparin/Coumadin bridge.  Therapy evaluations done revealing functional deficits and CIR was recommended for follow-up therapy.  He was admitted for comprehensive inpatient rehab on 08/05/2019.     He was maintained on IV heparin until INR greater than 2.5.  Pain control was reasonable with prn use of  oxycodone and Robaxin.  Foley was discontinued past admission and he was started on bladder program.  He continued to have problems with urinary retention requiring in and out caths at least 4-5 times a day and was placed on 1500 cc fluid restriction due to high PVRs.  Laxatives were added to help with OIC.  He was noted to develop increasing pain with edema of amputation site on 09/09.  Wound showed signs of ischemia and Dr. ClCarlis Abbottas consulted for input.  Surgical intervention recommended and patient was discharged to acute hospital for revision of wound to left AKA on  zero 9/10 a.m.  Postop pain control is improving.  He continues to have serosanguineous drainage from amputation site and currently on IV heparin/Coumadin bridge as INR subtherapeutic.  Follow-up CBC shows H&H to be relatively stable.  Therapy was resumed and patient continues to have limitations in mobility and ADLs.  He was cleared to resume CIR program on 09/14. Patient transferred to CIR on 08/17/2019 .    Patient currently requires max with basic self-care skills secondary to muscle weakness, decreased cardiorespiratoy endurance, decreased memory and delayed processing and decreased sitting balance.  Prior to hospitalization, patient was  fully independent and patient will benefit from skilled intervention to increase independence with basic self-care skills prior to discharge home with care partner.  Anticipate patient will require intermittent supervision and follow up home health.  OT - End of Session Activity Tolerance: Tolerates < 10 min activity with changes in vital signs Endurance Deficit: Yes Endurance Deficit Description: decreased, low blood pressure OT Assessment Rehab Potential (ACUTE ONLY): Good OT Barriers to Discharge: Decreased caregiver support OT Barriers to Discharge Comments: wife is a Education officer, environmental but has physical limitations OT Patient demonstrates impairments in the following area(s): Balance;Endurance;Pain;Motor OT Basic ADL's Functional Problem(s): Grooming;Bathing;Dressing;Toileting OT Transfers Functional Problem(s): Toilet;Tub/Shower OT Additional Impairment(s): None OT Plan OT Intensity: Minimum of 1-2 x/day, 45 to 90 minutes OT Frequency: 5 out of 7 days OT Duration/Estimated Length of Stay: 14-17 days OT Treatment/Interventions: Balance/vestibular training;Discharge planning;Disease mangement/prevention;Pain management;Functional mobility training;DME/adaptive equipment instruction;Patient/family education;Psychosocial support;Self Care/advanced ADL retraining;Skin care/wound managment;UE/LE Strength taining/ROM;Therapeutic Exercise;Therapeutic Activities OT Self Feeding Anticipated Outcome(s): I OT Basic Self-Care Anticipated Outcome(s): Mod I OT Toileting Anticipated Outcome(s): Mod I OT Bathroom Transfers Anticipated Outcome(s): Mod I to toilet, S to shower OT Recommendation Patient destination: Home Follow Up Recommendations: Home health OT Equipment Recommended: 3 in 1 bedside comode;Tub/shower bench   Skilled Therapeutic Intervention Pt seen for initial evaluation, but was unable to participate fully due to severe lethargy and low blood pressure. This therapist is familiar with this patient  as I evaluated him 2 weeks ago.  Pt was having major struggles with cognition this am, as he was very lethargic and his blood pressure was low.  His nurse stated that he had a great deal of medication this am and the pain medication may have affected him.  Therefore, pt needed a great deal of physical assist also.  He was able to do a light amount of bathing from bed, donn a new gown and used the urinal 2x.  Pt was only able to sit to EOB for 20 seconds and then began to pass out. Moved into trendelenburg position.  Pt's blood pressure only increased to 120/44 after it was 100/43.  Reported to RN.    Will plan on mod I goals for 2 weeks LOS.    Pt resting in bed with alarm set and all needs met.    OT Evaluation Precautions/Restrictions  Precautions Precautions: Fall Precaution Comments: monitor HR, sats and BP Restrictions Weight Bearing Restrictions: Yes LLE Weight Bearing: Non weight bearing Other Position/Activity Restrictions: L AKA - in ace wrap.  no drainage seen through wrap General OT Amount of Missed Time: 20 Minutes PT Missed Treatment Reason: Patient fatigue Vital Signs Therapy Vitals BP: (!) 100/43 Patient Position (if appropriate): Sitting Oxygen Therapy SpO2: 97 % O2 Device: Room Air Pain Pain Assessment Pain Scale: 0-10 Pain Score: 4  Home Living/Prior Functioning Home Living Family/patient expects to be discharged to:: Private residence Living Arrangements: Spouse/significant other Available  Help at Discharge: Available 24 hours/day, Family Type of Home: House Home Access: Stairs to enter [employer] of Steps: 1 small step and a threshold to get into house, pt has been negotiating backwards w/ RW since L toe amputation Home Layout: Two level, Able to live on main level with bedroom/bathroom Alternate Level Stairs-Number of Steps: flight Bathroom Shower/Tub: Multimedia programmer: Standard  Lives With: Spouse Prior Function Level of  Independence: Requires assistive device for independence, Independent with transfers, Independent with gait, Independent with homemaking with ambulation, Independent with basic ADLs  Able to Take Stairs?: Yes Driving: Yes Vocation: Retired Comments: pt used to be a Academic librarian, independent with all ADL and no AD ADL ADL ADL Comments: Pt initially evaluated on 08/06/19 (13 days ago) at a min -mod A level overall. Today due to lethargy and low blood pressure he was not able to fully participate in eval.  min A UB, max - total LB self care bed level. Vision Baseline Vision/History: Wears glasses Wears Glasses: At all times Patient Visual Report: No change from baseline Vision Assessment?: No apparent visual deficits Perception  Perception: Within Functional Limits Praxis Praxis: Intact Cognition Overall Cognitive Status: Impaired/Different from baseline Arousal/Alertness: Lethargic Orientation Level: Person;Situation(confused about where he was) Person: Oriented Place: Disoriented(with cues recalled he was on the 4th floor but initially did not think he was in a hospital) Situation: Oriented Year: 2020 Month: September Day of Week: Incorrect("I have no idea") Memory: Appears intact Immediate Memory Recall: Sock;Blue;Bed Memory Recall Sock: Not able to recall Memory Recall Blue: Not able to recall Memory Recall Bed: Not able to recall Attention: Focused;Sustained Focused Attention: Appears intact Sustained Attention: Appears intact Awareness: Appears intact Problem Solving: Appears intact Safety/Judgment: Appears intact Comments: slow to respond due to lethargy and low blood pressure Sensation Sensation Light Touch: Appears Intact Hot/Cold: Appears Intact Proprioception: Appears Intact Stereognosis: Appears Intact Coordination Gross Motor Movements are Fluid and Coordinated: No Fine Motor Movements are Fluid and Coordinated: Yes Coordination and Movement Description: L AKA  with generalized weakness/deconditioning Motor  Motor Motor: Other (comment) Motor - Skilled Clinical Observations: L AKA with generalized weakness/deconditioning Mobility  Bed Mobility Bed Mobility: Rolling Right;Rolling Left;Supine to Sit;Sit to Supine Rolling Right: Moderate Assistance - Patient 50-74% Rolling Left: Moderate Assistance - Patient 50-74% Supine to Sit: Maximal Assistance - Patient - Patient 25-49% Sit to Supine: Moderate Assistance - Patient 50-74%  Trunk/Postural Assessment  Cervical Assessment Cervical Assessment: Within Functional Limits Thoracic Assessment Thoracic Assessment: Within Functional Limits Lumbar Assessment Lumbar Assessment: Within Functional Limits Postural Control Postural Control: (at time of OT eval, pt unable to hold balance EOB due to low blood pressure)  Balance Balance Balance Assessed: Yes Static Sitting Balance Static Sitting - Balance Support: Bilateral upper extremity supported Static Sitting - Level of Assistance: 2: Max assist Static Sitting - Comment/# of Minutes: unable to hold balance due to low blood pressure and lethargy Dynamic Sitting Balance Dynamic Sitting - Balance Support: Right upper extremity supported;Left upper extremity supported Dynamic Sitting - Level of Assistance: 1: +1 Total assist Dynamic Sitting - Balance Activities: Forward lean/weight shifting;Lateral lean/weight shifting Extremity/Trunk Assessment RUE Assessment Active Range of Motion (AROM) Comments: WFL General Strength Comments: Grossly at least 3+/5 LUE Assessment Active Range of Motion (AROM) Comments: WFL General Strength Comments: 3+/5     Refer to Care Plan for Long Term Goals  Recommendations for other services: None    Discharge Criteria: Patient will be discharged from OT if patient  refuses treatment 3 consecutive times without medical reason, if treatment goals not met, if there is a change in medical status, if patient makes no  progress towards goals or if patient is discharged from hospital.  The above assessment, treatment plan, treatment alternatives and goals were discussed and mutually agreed upon: No family available/patient unable  [facility] 08/18/2019, 11:50 AM

## 2019-08-18 NOTE — Evaluation (Signed)
Physical Therapy Assessment and Plan  Patient Details  Name: Joseph Hoffman MRN: [id number] Date of Birth: 24-Oct-1951  PT Diagnosis: Abnormal posture, Abnormality of gait, Difficulty walking, Edema, Muscle spasms, Muscle weakness and Pain in L residual limb. Rehab Potential: Good ELOS: 2.5-3 weeks   Today's Date: 08/18/2019 PT Individual Time: 0800-0900 PT Individual Time Calculation (min): 60 min    Problem List:  Patient Active Problem List   Diagnosis Date Noted  . Above knee amputation of left lower extremity ([place]) 08/17/2019  . Nonhealing surgical wound 08/13/2019  . Non-healing amputation site ([place]) 08/13/2019  . Type 2 diabetes mellitus with peripheral neuropathy (HCC)   . Chronic combined systolic and diastolic CHF (congestive heart failure) ([place])   . Myalgia   . Urinary retention   . Insomnia due to medical condition   . Drug-induced constipation   . Anticoagulated on Coumadin   . Unilateral complete BKA, left, initial encounter ([place]) 08/05/2019  . Leukocytosis   . Acute on chronic anemia   . Post-operative pain   . Nausea & vomiting 07/29/2019  . Diarrhea 07/29/2019  . Diabetic ulcer of left foot associated with diabetes mellitus due to underlying condition ([place]) 07/29/2019  . Left ventricular ejection fraction of 30% to 35% 07/29/2019  . PAD (peripheral artery disease) ([place]) 07/28/2019  . Long term (current) use of anticoagulants 07/13/2019  . CAD, multiple vessel 07/10/2019  . Aortic valve regurgitation   . Aortic valve disease   . Foot ulcer, left ([place]) 06/26/2019  . Depression 05/28/2019  . Congestive heart failure (CHF) ([place]) 05/13/2019  . H/O heart valve replacement with mechanical valve   . Type II diabetes mellitus with renal manifestations ([place]) 05/12/2019  . Chest pain 05/12/2019  . Elevated troponin 05/12/2019  . Stage 3 chronic kidney disease ([place]) 05/12/2019  . Acute on chronic systolic (congestive) heart failure ([place]) 05/12/2019  . CRI (chronic  renal insufficiency), stage 3 (moderate) ([place]) 04/09/2019  . Acute combined systolic and diastolic heart failure ([place]) 04/09/2019  . Essential hypertension 06/03/2017  . Dyslipidemia, goal LDL below 70 04/11/2017  . AS (aortic stenosis) 10/28/2012  . Acute on chronic systolic heart failure, re-admitted 10/12/12 10/12/2012  . S/P AVR, 09/29/12, [employer] [facility]. (discharged 10/05/12) 09/30/2012  . Permanent atrial fibrillation, since 1994 09/30/2012  . Type 2 IDDM 09/30/2012  . OSA on CPAP 09/30/2012  . Normal coronary arteries at cath Oct 2013 09/30/2012  . Chronic anticoagulation 09/30/2012  . Severe aortic stenosis 09/25/2012    Past Medical History:  Past Medical History:  Diagnosis Date  . Anxiety   . Aortic stenosis   . Arthritis   . Asthma   . Asymptomatic LV dysfunction, EF 45%, normal coronary arteries on cardiac cath 09/18/12 09/30/2012  . CHF (congestive heart failure) ([place])   . Chronic anticoagulation, on Xarelto prior to admit 09/30/2012  . DM (diabetes mellitus) ([place]) 09/30/2012  . Hepatitis B 2003  . Hypertension   . Myocardial infarction ([place])   . Permanent atrial fibrillation, since 1994 09/30/2012   stress test 02/08/12- normal study, no significant ischemia  . S/P AVR (aortic valve replacement), 09/30/2012   a. s/p mechcanical AVR in 09/2012 (on Coumadin)  . Sleep apnea    uses CPAP   Past Surgical History:  Past Surgical History:  Procedure Laterality Date  . ABDOMINAL ANGIOGRAM  09/18/2012   Procedure: ABDOMINAL ANGIOGRAM;  Surgeon: Troy Sine, MD;  Location: [facility] CATH LAB;  Service: Cardiovascular;;  . ABDOMINAL AORTOGRAM W/LOWER EXTREMITY  N/A 07/02/2019   Procedure: ABDOMINAL AORTOGRAM W/LOWER EXTREMITY;  Surgeon: Marty Heck, MD;  Location: [place] CV LAB;  Service: Cardiovascular;  Laterality: N/A;  . AMPUTATION Left 07/03/2019   Procedure: AMPUTATION LEFT GREAT TOE;  Surgeon: Marty Heck, MD;  Location: [place];  Service: Vascular;   Laterality: Left;  . AMPUTATION Left 07/31/2019   Procedure: AMPUTATION BELOW KNEE LEFT;  Surgeon: Marty Heck, MD;  Location: [place];  Service: Vascular;  Laterality: Left;  . AMPUTATION Left 08/13/2019   Procedure: LEFT AMPUTATION ABOVE KNEE;  Surgeon: Marty Heck, MD;  Location: [place];  Service: Vascular;  Laterality: Left;  . AORTIC VALVE REPLACEMENT  09/29/2012   Procedure: AORTIC VALVE REPLACEMENT (AVR);  Surgeon: Gaye Pollack, MD;  Location: [place];  Service: Open Heart Surgery;  Laterality: N/A;  . ARCH AORTOGRAM  09/18/2012   Procedure: ARCH AORTOGRAM;  Surgeon: Troy Sine, MD;  Location: [facility] CATH LAB;  Service: Cardiovascular;;  . CARDIAC CATHETERIZATION  09/18/12   severe calcific aortic stenosis, peak gradient 62m, mean gradient 560m EF 45%  . CARDIAC VALVE REPLACEMENT     AVR 09-29-12  . CHOLECYSTECTOMY    . FRACTURE SURGERY    . LEFT AND RIGHT HEART CATHETERIZATION WITH CORONARY ANGIOGRAM N/A 09/18/2012   Procedure: LEFT AND RIGHT HEART CATHETERIZATION WITH CORONARY ANGIOGRAM;  Surgeon: ThTroy SineMD;  [facility];  Service: Cardiovascular;  Laterality: N/A;  . PERIPHERAL VASCULAR INTERVENTION Left 07/02/2019   Procedure: PERIPHERAL VASCULAR INTERVENTION;  Surgeon: ClMarty HeckMD;  [facility];  Service: Cardiovascular;  Laterality: Left;  . RIGHT HEART CATH AND CORONARY ANGIOGRAPHY N/A 06/29/2019   Procedure: RIGHT HEART CATH AND CORONARY ANGIOGRAPHY;  Surgeon: EnNelva BushMD;  [facility];  Service: Cardiovascular;  Laterality: N/A;  . TEE WITHOUT CARDIOVERSION N/A 05/29/2019   Procedure: TRANSESOPHAGEAL ECHOCARDIOGRAM (TEE);  Surgeon: CrLelon PerlaMD;  [facility];  Service: Cardiovascular;  Laterality: N/A;    Assessment & Plan Clinical Impression: Patient is a [age].o. year old male with history ofT2DM, CAD, CAF, mechanical AVR- on coumadin,OSA-CPAP with 2 L oxygen at  nights, left great toe amputation 07/03/2019 who was admitted with nausea vomiting, poor wound healing and purulent drainage.He was started on IV antibiotics and underwent left BKA on 07/31/2019 by Dr. ClCarlis AbbottHospital course complicated by hypotension with AKI, urinary retention, lethargy with bouts of confusion felt to be due to narcotics as well as subtherapeutic INR requiring heparin/Coumadin bridge. Therapy evaluations done revealing functional deficits and CIR was recommended for follow-up therapy. He was admitted for comprehensive inpatient rehab on 08/05/2019.   He was maintained on IV heparin untilINRgreater than 2.5. Pain control was reasonable with prn use ofoxycodone and Robaxin. Foley was discontinued past admission and he was started on bladder program. He continued to have problems with urinary retention requiring in and out caths at least 4-5 times a day and wasplaced on 1500 cc fluid restriction due tohighPVRs.Laxatives were added to help with OIC. He was noted to develop increasing pain with edema of amputation site on 09/09. Wound showed signs of ischemiaand Dr. ClCarlis Abbottas consulted for input. Surgical intervention recommended and patient was discharged to acute hospital for revision of wound to left AKA on zero 9/10 a.m. Postop pain control is improving.He continues to have serosanguineous drainage from amputation site and currently on IV heparin/Coumadin bridge as INR subtherapeutic. Follow-up CBC shows H&H to  be relatively stable. Therapy was resumed and patient continues to have limitations in mobility and ADLs. He was cleared to resume CIR program on 09/14.  Patient transferred to CIR on 08/17/2019 .   Patient currently requires max with mobility secondary to muscle weakness, decreased cardiorespiratoy endurance and decreased sitting balance, decreased standing balance, decreased postural control, decreased balance strategies and difficulty maintaining  precautions.  Prior to hospitalization, patient was modified independent  with mobility and lived with Spouse in a House home.  Home access is 1 small step and a threshold to get into house, pt has been negotiating backwards w/ RW since L toe amputationStairs to enter.  Patient will benefit from skilled PT intervention to maximize safe functional mobility, minimize fall risk and decrease caregiver burden for planned discharge home with 24 hour supervision.  Anticipate patient will benefit from follow up [place] at discharge.  PT - End of Session Activity Tolerance: Tolerates < 10 min activity, no significant change in vital signs;Tolerates 30+ min activity with multiple rests Endurance Deficit: Yes Endurance Deficit Description: requiered several rest breaks with minimal activity and asleep 1 hour into eval PT Assessment Rehab Potential (ACUTE/IP ONLY): Good PT Barriers to Discharge: Decreased caregiver support;Inaccessible home environment;Home environment access/layout;Wound Care PT Patient demonstrates impairments in the following area(s): Balance;Edema;Endurance;Motor;Pain;Perception;Safety;Nutrition PT Transfers Functional Problem(s): Bed Mobility;Bed to Chair;Car;Furniture;Floor PT Locomotion Functional Problem(s): Ambulation;Wheelchair Mobility;Stairs PT Plan PT Intensity: Minimum of 1-2 x/day ,45 to 90 minutes PT Frequency: 5 out of 7 days PT Duration Estimated Length of Stay: 2.5-3 weeks PT Treatment/Interventions: Ambulation/gait training;Community reintegration;DME/adaptive equipment instruction;Neuromuscular re-education;Psychosocial support;Stair training;UE/LE Strength taining/ROM;Wheelchair propulsion/positioning;UE/LE Coordination activities;Therapeutic Activities;Skin care/wound management;Pain management;Functional electrical stimulation;Discharge planning;Balance/vestibular training;Disease management/prevention;Functional mobility training;Patient/family  education;Splinting/orthotics;Therapeutic Exercise PT Recommendation Recommendations for Other Services: Therapeutic Recreation consult Therapeutic Recreation Interventions: Stress management Follow Up Recommendations: Home health PT Patient destination: Home Equipment Recommended: To be determined Equipment Details: has RW already  Skilled Therapeutic Intervention In addition to the PT evaluation below, the patient performed the following skilled PT interventions:  Patient in bed upon PT arrival. Patient alert and agreeable to PT session. Patient reported 12/10 L residual limb pain during session, RN made aware and provided pain meds during session. PT provided repositioning, rest breaks, and distraction as pain interventions throughout session.   Therapeutic Activity: Bed Mobility: Patient performed bed mobility as described below. Reported feeling light headed after sitting EOB x4 min and requested to lie down. Vitals below.  Therapeutic Exercise: Patient performed the following exercises with verbal and tactile cues for proper technique. -L hip flexion 2x10 -B isometric hip adduction x10 -L isometric hip extension x5 Patient became lethargic during exercises in supine and eventually fell asleep.  Dr. Dagoberto Ligas, MD, rounded at end of session and PT updated her on patients mobility, pain, vitals, and symptoms during session. Patient did not arouse during out conversation.   Patient in bed asleep at end of session with breaks locked, bed alarm set, and all needs within reach. Missed 15 min of skilled PT due to patient fatigue.  Instructed pt in results of PT evaluation as detailed below, PT POC, rehab potential, rehab goals, and discharge recommendations. Additionally discussed CIR's policies regarding fall safety and use of chair alarm and/or quick release belt. Pt verbalized understanding and in agreement. Will update pt's family members as they become available.    PT  Evaluation Precautions/Restrictions Precautions Precautions: Fall Precaution Comments: monitor HR, sats and BP Restrictions Weight Bearing Restrictions: Yes LLE Weight Bearing: Non weight bearing General PT  Amount of Missed Time (min): 15 Minutes PT Missed Treatment Reason: Patient fatigue  Vital Signs BP: (!) 102/76 HR: 76 (stable) Patient Position (if appropriate): Supine BP: (!) 97/77 HR: 74 (stable) Patient Position (if appropriate): Sitting BP: (!) 145/51 HR: 59-73 (labile)  Patient Position (if appropriate): Supine after sitting 4 min BP: (!) 134/51 HR: 71 (stable) Patient Position (if appropriate): Supine after 2 min rest Home Living/Prior Functioning Home Living Available Help at Discharge: Available 24 hours/day;Family(wife is retired Education officer, environmental, but has health issues, can only provide CGA-supervision A 24/7, son available PRN) Type of Home: House Home Access: Stairs to enter [employer] of Steps: 1 small step and a threshold to get into house, pt has been negotiating backwards w/ RW since L toe amputation Home Layout: Two level;Able to live on main level with bedroom/bathroom Alternate Level Stairs-Number of Steps: flight Bathroom Shower/Tub: Multimedia programmer: Standard  Lives With: Spouse Prior Function Level of Independence: Requires assistive device for independence;Independent with transfers;Independent with gait Vocation: Retired Comments: pt used to be a Academic librarian, independent with all ADL and no AD Vision/Perception  Perception Perception: Within Functional Limits Praxis Praxis: Intact  Cognition Orientation Level: Oriented X4 Attention: Focused;Sustained Focused Attention: Appears intact Sustained Attention: Appears intact Memory: Appears intact Awareness: Appears intact Problem Solving: Appears intact Safety/Judgment: Appears intact Comments: Slow to respond intermittently, but overall WFL Sensation Sensation Light Touch:  Appears Intact Proprioception: Appears Intact Coordination Gross Motor Movements are Fluid and Coordinated: No Fine Motor Movements are Fluid and Coordinated: Yes Coordination and Movement Description: L AKA with generalized weakness/deconditioning Motor  Motor Motor: Other (comment) Motor - Skilled Clinical Observations: L AKA with generalized weakness/deconditioning  Mobility Bed Mobility Bed Mobility: Rolling Right;Rolling Left;Supine to Sit;Sit to Supine Rolling Right: Moderate Assistance - Patient 50-74% Rolling Left: Moderate Assistance - Patient 50-74% Supine to Sit: Maximal Assistance - Patient - Patient 25-49% Sit to Supine: Moderate Assistance - Patient 50-74% Transfers Transfers: (Deferred transferrs today due to labial BP and HR with sitting EOB.) Locomotion  Gait Ambulation: No(Labial BP and HR sitting EOB with reports of feeling lightheaded) Gait Gait: No(Labial BP and HR sitting EOB with reports of feeling lightheaded) Stairs / Additional Locomotion Stairs: No(Labial BP and HR sitting EOB with reports of feeling lightheaded) Wheelchair Mobility Wheelchair Mobility: No(Labial BP and HR sitting EOB with reports of feeling lightheaded)  Trunk/Postural Assessment  Cervical Assessment Cervical Assessment: Within Functional Limits Thoracic Assessment Thoracic Assessment: Within Functional Limits Lumbar Assessment Lumbar Assessment: Within Functional Limits Postural Control Postural Control: Within Functional Limits  Balance Balance Balance Assessed: Yes Static Sitting Balance Static Sitting - Balance Support: Bilateral upper extremity supported Static Sitting - Level of Assistance: 2: Max assist Static Sitting - Comment/# of Minutes: Progressed to CGA-min A after 2 min Dynamic Sitting Balance Dynamic Sitting - Balance Support: Right upper extremity supported;Left upper extremity supported Dynamic Sitting - Level of Assistance: 3: Mod assist Dynamic Sitting -  Balance Activities: Forward lean/weight shifting;Lateral lean/weight shifting Extremity Assessment  RUE Assessment Active Range of Motion (AROM) Comments: WFL General Strength Comments: Grossly at least 3+/5 LUE Assessment Active Range of Motion (AROM) Comments: WFL General Strength Comments: Grossly at least 3+/5 RLE Assessment Active Range of Motion (AROM) Comments: WFL General Strength Comments: Grossly at least 3+/5 in sitting, can perform SLR in supine LLE Assessment LLE Assessment: Exceptions to [facility] Active Range of Motion (AROM) Comments: Hip flexion to at least 90 deg and hip extension to neutral in supine General  Strength Comments: able to move through available ROM w/o assist, unable to tolerate any resistance 2/2 pain, 3/5 globally    Refer to Care Plan for Long Term Goals  Recommendations for other services: Therapeutic Recreation  Stress management  Discharge Criteria: Patient will be discharged from PT if patient refuses treatment 3 consecutive times without medical reason, if treatment goals not met, if there is a change in medical status, if patient makes no progress towards goals or if patient is discharged from hospital.  The above assessment, treatment plan, treatment alternatives and goals were discussed and mutually agreed upon: by patient  Doreene Burke PT, DPT  08/18/2019, 12:15 PM

## 2019-08-18 NOTE — Progress Notes (Signed)
Patient information reviewed and entered into e[facility] System by Becky Mykell Genao, PPS coordinator. Information including medical coding, function ability, and quality indicators will be reviewed and updated through discharge.   

## 2019-08-18 NOTE — Consult Note (Signed)
Requesting Physician: Dr. Dagoberto Ligas    Chief Complaint: Dysarthria, altered mental status and right-sided weakness  History obtained from: Patient, rapid response and Chart    HPI:                                                                                                                                       Joseph Hoffman is a 68 y.o. male with past medical history of hypertension, diabetes mellitus, mechanical valve on chronic anticoagulation, CHF, atrial fibrillation, sleep apnea, aortic stenosis admitted to the hospital for below the knee amputation for foot ulcer due to peripheral artery disease.  Patient was in rehab post AKA of the left leg. Rapid response was called because patient had becoming increasingly lethargic as the day progressed.  Last known normal was unclear, per nurse patient has been lethargic since this morning.  On assessment by rapid response nurse, patient was confused, had slurred speech with right upper extremity right lower extremity weakness.  As patient was on heparin drip and Coumadin code stroke was called and patient was immediately taken to CT scan.  Patient also received Narcan prior to being taken to the CT scan.  On assessment, patient was more awake and answering questions appropriately.  He no longer had any weakness in the right upper extremity.  A stat CT head did not show any acute findings.  CT angiogram was not performed due to low suspicion for LVO and impaired renal function.  Patient did not receive IV TPA due to being on heparin drip.    Past Medical History:  Diagnosis Date  . Anxiety   . Aortic stenosis   . Arthritis   . Asthma   . Asymptomatic LV dysfunction, EF 45%, normal coronary arteries on cardiac cath 09/18/12 09/30/2012  . CHF (congestive heart failure) ([place])   . Chronic anticoagulation, on Xarelto prior to admit 09/30/2012  . DM (diabetes mellitus) ([place]) 09/30/2012  . Hepatitis B 2003  . Hypertension   . Myocardial infarction  ([place])   . Permanent atrial fibrillation, since 1994 09/30/2012   stress test 02/08/12- normal study, no significant ischemia  . S/P AVR (aortic valve replacement), 09/30/2012   a. s/p mechcanical AVR in 09/2012 (on Coumadin)  . Sleep apnea    uses CPAP    Past Surgical History:  Procedure Laterality Date  . ABDOMINAL ANGIOGRAM  09/18/2012   Procedure: ABDOMINAL ANGIOGRAM;  Surgeon: Troy Sine, MD;  Location: [facility] CATH LAB;  Service: Cardiovascular;;  . ABDOMINAL AORTOGRAM W/LOWER EXTREMITY N/A 07/02/2019   Procedure: ABDOMINAL AORTOGRAM W/LOWER EXTREMITY;  Surgeon: Marty Heck, MD;  Location: [place] CV LAB;  Service: Cardiovascular;  Laterality: N/A;  . AMPUTATION Left 07/03/2019   Procedure: AMPUTATION LEFT GREAT TOE;  Surgeon: Marty Heck, MD;  Location: [place];  Service: Vascular;  Laterality: Left;  . AMPUTATION Left 07/31/2019   Procedure: AMPUTATION BELOW KNEE  LEFT;  Surgeon: Marty Heck, MD;  Location: [place];  Service: Vascular;  Laterality: Left;  . AMPUTATION Left 08/13/2019   Procedure: LEFT AMPUTATION ABOVE KNEE;  Surgeon: Marty Heck, MD;  Location: [place];  Service: Vascular;  Laterality: Left;  . AORTIC VALVE REPLACEMENT  09/29/2012   Procedure: AORTIC VALVE REPLACEMENT (AVR);  Surgeon: Gaye Pollack, MD;  Location: [place];  Service: Open Heart Surgery;  Laterality: N/A;  . ARCH AORTOGRAM  09/18/2012   Procedure: ARCH AORTOGRAM;  Surgeon: Troy Sine, MD;  Location: [facility] CATH LAB;  Service: Cardiovascular;;  . CARDIAC CATHETERIZATION  09/18/12   severe calcific aortic stenosis, peak gradient 9mm, mean gradient 27mm, EF 45%  . CARDIAC VALVE REPLACEMENT     AVR 09-29-12  . CHOLECYSTECTOMY    . FRACTURE SURGERY    . LEFT AND RIGHT HEART CATHETERIZATION WITH CORONARY ANGIOGRAM N/A 09/18/2012   Procedure: LEFT AND RIGHT HEART CATHETERIZATION WITH CORONARY ANGIOGRAM;  Surgeon: Troy Sine, MD;  Location: [facility] CATH LAB;  Service:  Cardiovascular;  Laterality: N/A;  . PERIPHERAL VASCULAR INTERVENTION Left 07/02/2019   Procedure: PERIPHERAL VASCULAR INTERVENTION;  Surgeon: Marty Heck, MD;  Location: [place] CV LAB;  Service: Cardiovascular;  Laterality: Left;  . RIGHT HEART CATH AND CORONARY ANGIOGRAPHY N/A 06/29/2019   Procedure: RIGHT HEART CATH AND CORONARY ANGIOGRAPHY;  Surgeon: Nelva Bush, MD;  Location: [place] CV LAB;  Service: Cardiovascular;  Laterality: N/A;  . TEE WITHOUT CARDIOVERSION N/A 05/29/2019   Procedure: TRANSESOPHAGEAL ECHOCARDIOGRAM (TEE);  Surgeon: Lelon Perla, MD;  Location: [facility] ENDOSCOPY;  Service: Cardiovascular;  Laterality: N/A;    Family History  Problem Relation Age of Onset  . Hypertension Mother   . Diabetes Father   . Heart attack Brother 24  . Hyperlipidemia Brother 9       stents placed   Social History:  reports that he quit smoking about 36 years ago. His smoking use included cigarettes. He has a 2.50 pack-year smoking history. He has quit using smokeless tobacco. He reports current alcohol use. He reports that he does not use drugs.  Allergies:  Allergies  Allergen Reactions  . Iohexol Anaphylaxis  . Niacin And Related     Flushing with immediate realese  . Penicillins Other (See Comments)    Unknown.[name]  Did it involve swelling of the face/tongue/throat, SOB, or low BP? Unknown Did it involve sudden or severe rash/hives, skin peeling, or any reaction on the inside of your mouth or nose? Unknown Did you need to seek medical attention at a hospital or doctor's office? Unknown When did it last happen?Childhood If all above answers are "NO", may proceed with cephalosporin use.    Medications:                                                                                                                        I reviewed home medications   ROS:  14 systems reviewed and negative except above    Examination:                                                                                                      General: Appears well-developed and well-nourished.  Psych: Affect appropriate to situation Eyes: No scleral injection HENT: No OP obstrucion Head: Normocephalic.  Cardiovascular: Normal rate and regular rhythm.  Respiratory: Effort normal and breath sounds normal to anterior ascultation GI: Soft.  No distension. There is no tenderness.  Skin: WDI    Neurological Examination Mental Status: Alert, oriented, thought content appropriate.  Some confusion such as difficulty remembering current president.  Speech fluent without evidence of aphasia (naming and repetition intact). Able to follow 3 step commands without difficulty. Cranial Nerves: II: Visual fields grossly normal,  III,IV, VI: ptosis not present, extra-ocular motions intact bilaterally, pupils equal, round, reactive to light and accommodation V,VII: smile symmetric, facial light touch sensation normal bilaterally VIII: hearing normal bilaterally IX,X: uvula rises symmetrically XI: bilateral shoulder shrug XII: midline tongue extension Motor: Right : Upper extremity   5/5    Left:     Upper extremity   5/5  Lower extremity   5/5     Lower extremity  AKA Tone and bulk:normal tone throughout; no atrophy noted Sensory: Pinprick and light touch intact throughout, bilaterally Plantars: Right: downgoing   Left: downgoing Cerebellar: normal finger-to-nose bilaterally      Lab Results: Basic Metabolic Panel: Recent Labs  Lab 08/14/19 0701 08/14/19 1123 08/15/19 0256 08/17/19 1851 08/18/19 0333  NA 135  --  132* 133* 132*  K 5.1  --  4.3 4.6 4.7  CL 100  --  97* 98 95*  CO2 25  --  23 25 26   GLUCOSE 381* 383* 279* 263* 373*  BUN 44*  --  45* 43* 47*  CREATININE 1.71*  --  1.50* 1.59* 1.72*  CALCIUM  9.2  --  8.9 8.8* 8.8*    CBC: Recent Labs  Lab 08/12/19 1309  08/14/19 0701 08/15/19 0256 08/16/19 0231 08/17/19 0555 08/18/19 0333  WBC 14.6*   < > 6.6 6.7 9.4 11.1* 10.9*  NEUTROABS 11.7*  --   --   --   --   --  8.0*  HGB 8.6*   < > 8.7* 8.7* 8.8* 8.7* 7.5*  HCT 27.6*   < > 28.3* 28.2* 27.8* 27.8* 24.4*  MCV 86.5   < > 85.8 84.9 82.7 85.0 86.2  PLT 270   < > 276 306 316 356 298   < > = values in this interval not displayed.    Coagulation Studies: Recent Labs    08/16/19 0231 08/17/19 0555 08/18/19 0333  LABPROT 15.2 16.5* 17.8*  INR 1.2 1.4* 1.5*    Imaging: Ct Head Code Stroke Wo Contrast  Result Date: 08/18/2019 CLINICAL DATA:  Code stroke.  Aphasia and dysarthria. EXAM: CT HEAD WITHOUT CONTRAST TECHNIQUE: Contiguous axial images were obtained from the base of the skull through the vertex without intravenous contrast. COMPARISON:  08/27/2008 FINDINGS: Brain: Generalized atrophy.  Mild chronic small-vessel ischemic changes of the deep white matter. Old small vessel infarction left basal ganglia. Old small vessel infarction right cerebellum. No sign of acute infarction, mass lesion, hemorrhage, hydrocephalus or extra-axial collection. Vascular: There is atherosclerotic calcification of the major vessels at the base of the brain. Skull: Normal Sinuses/Orbits: Clear/normal Other: None ASPECTS ([place] Stroke Program Early CT Score) - Ganglionic level infarction (caudate, lentiform nuclei, internal capsule, insula, M1-M3 cortex): 7 - Supraganglionic infarction (M4-M6 cortex): 3 Total score (0-10 with 10 being normal): 10 IMPRESSION: 1. No acute finding by CT. Chronic small-vessel ischemic changes of the white matter, cerebellum and left basal ganglia. 2. ASPECTS is 10 3. These results were communicated to Dr. Lorraine Lax at 5:47 pmon 9/15/2020by text page via the [facility] messaging system. Electronically Signed   By: Nelson Chimes M.D.   On: 08/18/2019 17:49     I have reviewed the above  imaging : CT head   ASSESSMENT AND PLAN  68 year old patient with multiple vascular risk factors, mechanical heart valve on heparin drip and Coumadin stroke alerted for worsening lethargy and possible right-sided weakness.  Patient received Narcan and improved significantly and I suspect that dysarthria or weakness likely from narcotics.  Patient also has asterixis on exam and so likely this is toxic metabolic encephalopathy.   Acute toxic metabolic encephalopathy  Recommendations Minimize narcotics ABG Frequent neurochecks  Correct metabolic disturbances such as hyperglycemia  CPAP for obstructive sleep apnea    [facility] Pager Number [phone]

## 2019-08-18 NOTE — Progress Notes (Signed)
Meds delayed due to pt being in too deep sleep to swallow. Responds to sternal rub and followed command to grip hands bilaterally before drifting off to sleep immediately after. Charge RN notified and also assessed at bedside. VSS. No other acute changes, will continue to monitor neuro checks q2h.

## 2019-08-18 NOTE — Progress Notes (Signed)
Pt triggered MEWS score 2=yellow due to decreased LOC. Will continue to do neuro checks and q2h vitals as before. Charge RN notified of update. No medications given at this time.    Vital Signs MEWS/VS Documentation      08/18/2019 2145 08/18/2019 2150 08/18/2019 2241 08/18/2019 2341   MEWS Score:  3  0  0  2   MEWS Score Color:  Yellow  Green  Green  Yellow   Pulse:  -  64  65  -   BP:  -  -  133/60  -   Temp:  -  -  98.7 F (37.1 C)  -   Level of Consciousness:  Responds to Pain  -  -  Responds to Pain           Nazareth Norenberg R Hindy Perrault 08/19/2019,12:02 AM

## 2019-08-18 NOTE — Progress Notes (Signed)
Occupational Therapy Session Note  Patient Details  Name: Joseph Hoffman MRN: [id number] Date of Birth: 06-Aug-1951  Today's Date: 08/18/2019 OT Individual Time: [id number] OT Individual Time Calculation (min): 18 min  and Today's Date: 08/18/2019 OT Missed Time: 27 Minutes Missed Time Reason: Patient fatigue   Short Term Goals: Week 1:  OT Short Term Goal 1 (Week 1): Pt will be able to transfer to [facility] and or toilet with mod A using LRAD. OT Short Term Goal 2 (Week 1): Pt will be able to bathe LB with min A. OT Short Term Goal 3 (Week 1): Pt will be able to dress LB with mod A. OT Short Term Goal 4 (Week 1): Pt will be able to maintain static stand for 1 minute with mod A to pull clothing over hips.  Skilled Therapeutic Interventions/Progress Updates:  Attempted to engage pt in any functional tasks to address mobility or endurance.  Pt falling asleep despite SWK attempting to engage with pt upon arrival.  Pt required frequent verbalizations of name to arouse but unable to maintain fully aroused to participate in any activity.  Pt reports excruciating pain in Lt residual limb with no tolerance to allow therapist to touch to attempt rewrapping or education on desensitization or other pain management strategies.  Once pain subsided, pt returning to sleep.  RN notified of pain and decreased arousal.   Therapy Documentation Precautions:  Precautions Precautions: Fall Precaution Comments: monitor HR, sats and BP Restrictions Weight Bearing Restrictions: Yes LLE Weight Bearing: Non weight bearing Other Position/Activity Restrictions: L AKA - in ace wrap.  no drainage seen through wrap General: General OT Amount of Missed Time: 27 Minutes Vital Signs: Therapy Vitals Temp: 98.1 F (36.7 C) Pulse Rate: 70 Resp: 16 BP: (!) 156/115 Patient Position (if appropriate): Lying Oxygen Therapy SpO2: 97 % O2 Device: Room Air Pain: Pain Assessment Pain Scale: 0-10 Pain Score:  10   Therapy/Group: Individual Therapy  Simonne Come 08/18/2019, 2:46 PM

## 2019-08-18 NOTE — Progress Notes (Signed)
PT called nurse into room due to patient have a low diastolic blood pressure. Patient vitals was 118/44 Blood sugar was 208. Patient started becoming slow to respond and his right side was weak. Due to change in condition rapid response and MD Lovorn was notified of issue. Once patient was assessed rapid nurse  Gave orders to administer narcan, rapid nurse called a code stroke,  patient went down for a STAT  CT scan.  Patient arrived back up to the floor with orders to continue heparin drip, administer fluids over 12 hrs and neuro checks q2hrs x 12 hrs. Vitals stable, no fever noted. Patient alert and communicating well.  Patient passed  swallowing test with rapid response nurse. tylenol was given for pain patient tolerated well patient  is in bed in stable condition.

## 2019-08-19 ENCOUNTER — Inpatient Hospital Stay (HOSPITAL_COMMUNITY): Payer: Medicare Other | Admitting: Physical Therapy

## 2019-08-19 ENCOUNTER — Inpatient Hospital Stay (HOSPITAL_COMMUNITY): Payer: Medicare Other

## 2019-08-19 ENCOUNTER — Inpatient Hospital Stay (HOSPITAL_COMMUNITY): Payer: Medicare Other | Admitting: Occupational Therapy

## 2019-08-19 LAB — GLUCOSE, CAPILLARY
Glucose-Capillary: 205 mg/dL — ABNORMAL HIGH (ref 70–99)
Glucose-Capillary: 232 mg/dL — ABNORMAL HIGH (ref 70–99)
Glucose-Capillary: 144 mg/dL — ABNORMAL HIGH (ref 70–99)
Glucose-Capillary: 264 mg/dL — ABNORMAL HIGH (ref 70–99)

## 2019-08-19 LAB — CBC
HCT: 22.7 % — ABNORMAL LOW (ref 39.0–52.0)
Hemoglobin: 7.3 g/dL — ABNORMAL LOW (ref 13.0–17.0)
MCH: 27.2 pg (ref 26.0–34.0)
MCHC: 32.2 g/dL (ref 30.0–36.0)
MCV: 84.7 fL (ref 80.0–100.0)
Platelets: 250 10*3/uL (ref 150–400)
RBC: 2.68 MIL/uL — ABNORMAL LOW (ref 4.22–5.81)
RDW: 18.2 % — ABNORMAL HIGH (ref 11.5–15.5)
WBC: 10.6 10*3/uL — ABNORMAL HIGH (ref 4.0–10.5)
nRBC: 0 % (ref 0.0–0.2)

## 2019-08-19 LAB — PREPARE RBC (CROSSMATCH)

## 2019-08-19 LAB — PROTIME-INR
INR: 1.6 — ABNORMAL HIGH (ref 0.8–1.2)
Prothrombin Time: 19 seconds — ABNORMAL HIGH (ref 11.4–15.2)

## 2019-08-19 LAB — HEPARIN LEVEL (UNFRACTIONATED): Heparin Unfractionated: 0.31 IU/mL (ref 0.30–0.70)

## 2019-08-19 MED ORDER — SODIUM CHLORIDE 0.9% FLUSH: 10.0000 mL | INTRAVENOUS | Status: DC | PRN

## 2019-08-19 MED ORDER — ACETAMINOPHEN 325 MG PO TABS
650.0000 mg | ORAL_TABLET | Freq: Once | ORAL | Status: AC
  Administered 2019-08-19: 14:00:00 650 mg via ORAL
  Filled 2019-08-19: qty 2

## 2019-08-19 MED ORDER — FUROSEMIDE 10 MG/ML IJ SOLN
20.0000 mg | Freq: Once | INTRAMUSCULAR | Status: AC
  Administered 2019-08-19: 20 mg via INTRAVENOUS
  Filled 2019-08-19: qty 2

## 2019-08-19 MED ORDER — WARFARIN SODIUM 7.5 MG PO TABS
12.5000 mg | ORAL_TABLET | Freq: Once | ORAL | Status: AC
  Administered 2019-08-19: 12.5 mg via ORAL
  Filled 2019-08-19: qty 1

## 2019-08-19 MED ORDER — DIPHENHYDRAMINE HCL 25 MG PO CAPS
25.0000 mg | ORAL_CAPSULE | Freq: Once | ORAL | Status: AC
  Administered 2019-08-19: 25 mg via ORAL
  Filled 2019-08-19: qty 1

## 2019-08-19 MED ORDER — TRAMADOL HCL 50 MG PO TABS
50.0000 mg | ORAL_TABLET | Freq: Three times a day (TID) | ORAL | Status: DC
  Administered 2019-08-19 – 2019-09-15 (×80): 50 mg via ORAL
  Filled 2019-08-19 (×80): qty 1

## 2019-08-19 MED ORDER — ICOSAPENT ETHYL 1 G PO CAPS
2.0000 g | ORAL_CAPSULE | Freq: Two times a day (BID) | ORAL | Status: DC
  Administered 2019-08-19 – 2019-09-15 (×53): 2 g via ORAL
  Filled 2019-08-19 (×56): qty 2

## 2019-08-19 NOTE — Patient Care Conference (Signed)
Inpatient RehabilitationTeam Conference and Plan of Care Update Date: 08/19/2019   Time: 9:55 AM    Patient Name: Joseph Hoffman      Medical Record Number: [id number]  Date of Birth: Apr 09, 1951 Sex: Male         Room/Bed: 4W21C/4W21C-01 Payor Info: Payor: MEDICARE / Plan: MEDICARE PART A AND B / Product Type: *No Product type* /    Admit Date/Time:  08/17/2019  5:57 PM  Primary Diagnosis:  Above knee amputation of left lower extremity [facility])  Patient Active Problem List   Diagnosis Date Noted  . Above knee amputation of left lower extremity ([place]) 08/17/2019  . Nonhealing surgical wound 08/13/2019  . Non-healing amputation site ([place]) 08/13/2019  . Type 2 diabetes mellitus with peripheral neuropathy ([facility])   . Chronic combined systolic and diastolic CHF (congestive heart failure) ([place])   . Myalgia   . Urinary retention   . Insomnia due to medical condition   . Drug-induced constipation   . Anticoagulated on Coumadin   . Unilateral complete BKA, left, initial encounter ([place]) 08/05/2019  . Leukocytosis   . Acute on chronic anemia   . Post-operative pain   . Nausea & vomiting 07/29/2019  . Diarrhea 07/29/2019  . Diabetic ulcer of left foot associated with diabetes mellitus due to underlying condition ([place]) 07/29/2019  . Left ventricular ejection fraction of 30% to 35% 07/29/2019  . PAD (peripheral artery disease) ([place]) 07/28/2019  . Long term (current) use of anticoagulants 07/13/2019  . CAD, multiple vessel 07/10/2019  . Aortic valve regurgitation   . Aortic valve disease   . Foot ulcer, left ([place]) 06/26/2019  . Depression 05/28/2019  . Congestive heart failure (CHF) ([place]) 05/13/2019  . H/O heart valve replacement with mechanical valve   . Type II diabetes mellitus with renal manifestations ([place]) 05/12/2019  . Chest pain 05/12/2019  . Elevated troponin 05/12/2019  . Stage 3 chronic kidney disease ([place]) 05/12/2019  . Acute on chronic systolic (congestive) heart failure ([place])  05/12/2019  . CRI (chronic renal insufficiency), stage 3 (moderate) ([place]) 04/09/2019  . Acute combined systolic and diastolic heart failure ([place]) 04/09/2019  . Essential hypertension 06/03/2017  . Dyslipidemia, goal LDL below 70 04/11/2017  . AS (aortic stenosis) 10/28/2012  . Acute on chronic systolic heart failure, re-admitted 10/12/12 10/12/2012  . S/P AVR, 09/29/12, [facility]. (discharged 10/05/12) 09/30/2012  . Permanent atrial fibrillation, since 1994 09/30/2012  . Type 2 IDDM 09/30/2012  . OSA on CPAP 09/30/2012  . Normal coronary arteries at cath Oct 2013 09/30/2012  . Chronic anticoagulation 09/30/2012  . Severe aortic stenosis 09/25/2012    Expected Discharge Date: Expected Discharge Date: (2-3 weeks-TBD)  Team Members Present: Physician leading conference: Dr. Courtney Heys Social Worker Present: Ovidio Kin, LCSW Nurse Present: Blair Heys, RN PT Present: Apolinar Junes, PT OT Present: Willeen Cass, OT;Roanna Epley, COTA SLP Present: Weston Anna, SLP PPS Coordinator present : Gunnar Fusi, SLP     Current Status/Progress Goal Weekly Team Focus  Bowel/Bladder   cont B&B however PVRs remain >350 with I&O cath q6h, lbm 9/15  pt able to fully empty bladder  sit pt upright to pee to focus on emptying bladder without cath   Swallow/Nutrition/ Hydration             ADL's   on evaluation, pt was very lethargic with low blood pressure so he had confusion/ disorientation and therefore needed max - total A  mod I overall; shower transfers-supervision  ADL training,  functional mobility, pain tolerance, limb care, education   Mobility   Max A bed mobility during eval. Tolerated 4 min in sitting EOB before becoming light headed and requiring to lie back down.  S-mod I w/c level, gait 10' into bathroom with RW  Amputee education, strengthening/ROM, balance, functional mobility, w/c mobility, activity tolerance, pain/edema control, patient/family education    Communication             Safety/Cognition/ Behavioral Observations            Pain   pain ranges from 4-10 in left leg  level of pain tolerable enough to participate in therapies  manage pain without oversedation/adverse side effects   Skin   surgical incision to left AKA site with gauze and compression bandage  keep dressing CDI to prevent infection  changer dressing daily as ordered      *See Care Plan and progress notes for long and short-term goals.     Barriers to Discharge  Current Status/Progress Possible Resolutions Date Resolved   Nursing                  PT  Medical stability  Labial BP and HR during PT evaluation, rapid response called code stroke on 9/15, CT negative per chart.              OT Decreased caregiver support  wife is a Education officer, environmental but has physical limitations             SLP                SW                Discharge Planning/Teaching Needs:  HOme with wife who is not able to physically assist him due to her own health issues. He will need to be mod/i wheelchair level to go home safely. BP issues yesterday could not fully participate in therapies      Team Discussion:  Lethargic yesterday and medical issues. Code stroke called CT negative. hemoglobin dropped MD watching. BP issues. Unable to participate in therapies due to medical issues. Goals hopefully mod/i wheelchair level needed for wife to be able to manage him at home. Set target discharge date at next week's conference.  Revisions to Treatment Plan:  2-3 weeks    Medical Summary Current Status: medically complex- needs transfusion, had code stroke called last night; very sedated- working on pain control/calling cards consult Weekly Focus/Goal: focus on getting pain under control and make sure no stroke- CT (-)  Barriers to Discharge: Behavior;Neurogenic Bowel & Bladder;Medical stability;Wound care;Other (comments)  Barriers to Discharge Comments: pain control Possible Resolutions to Barriers:  cards consult   Continued Need for Acute Rehabilitation Level of Care: The patient requires daily medical management by a physician with specialized training in physical medicine and rehabilitation for the following reasons: Direction of a multidisciplinary physical rehabilitation program to maximize functional independence : Yes Medical management of patient stability for increased activity during participation in an intensive rehabilitation regime.: Yes   I attest that I was present, lead the team conference, and concur with the assessment and plan of the team. Teleconference held due to COVID 19   Yasmin Dibello, Gardiner Rhyme 08/19/2019, 1:49 PM

## 2019-08-19 NOTE — Plan of Care (Signed)
  Problem: Consults Goal: RH LIMB LOSS PATIENT EDUCATION Description: Description: See Patient Education module for eduction specifics. Outcome: Progressing Goal: Skin Care Protocol Initiated - if Braden Score 18 or less Description: If consults are not indicated, leave blank or document N/A Outcome: Progressing   Problem: RH BOWEL ELIMINATION Goal: RH STG MANAGE BOWEL WITH ASSISTANCE Description: STG Manage Bowel with Assistance. Outcome: Progressing Flowsheets (Taken 08/19/2019 1713) STG: Pt will manage bowels with assistance: 4-Minimum assistance Goal: RH STG MANAGE BOWEL W/MEDICATION W/ASSISTANCE Description: STG Manage Bowel with Medication with Assistance. Outcome: Progressing Flowsheets (Taken 08/19/2019 1713) STG: Pt will manage bowels with medication with assistance: 4-Minimal assistance   Problem: RH BLADDER ELIMINATION Goal: RH STG MANAGE BLADDER WITH ASSISTANCE Description: STG Manage Bladder With Assistance Outcome: Progressing Flowsheets (Taken 08/19/2019 1713) STG: Pt will manage bladder with assistance: 1-Total assistance Goal: RH STG MANAGE BLADDER WITH MEDICATION WITH ASSISTANCE Description: STG Manage Bladder With Medication With Assistance. Outcome: Progressing Flowsheets (Taken 08/19/2019 1713) STG: Pt will manage bladder with medication with assistance: 1-Total assistance   Problem: RH SKIN INTEGRITY Goal: RH STG SKIN FREE OF INFECTION/BREAKDOWN Outcome: Progressing Goal: RH STG MAINTAIN SKIN INTEGRITY WITH ASSISTANCE Description: STG Maintain Skin Integrity With Assistance. Outcome: Progressing Flowsheets (Taken 08/19/2019 1713) STG: Maintain skin integrity with assistance: 3-Moderate assistance Goal: RH STG ABLE TO PERFORM INCISION/WOUND CARE W/ASSISTANCE Description: STG Able To Perform Incision/Wound Care With Assistance. Outcome: Progressing Flowsheets (Taken 08/19/2019 1713) STG: Pt will be able to perform incision/wound care with assistance:  3-Moderate assistance   Problem: RH SAFETY Goal: RH STG ADHERE TO SAFETY PRECAUTIONS W/ASSISTANCE/DEVICE Description: STG Adhere to Safety Precautions With Assistance/Device. Outcome: Progressing Flowsheets (Taken 08/19/2019 1713) STG:Pt will adhere to safety precautions with assistance/device: 3-Moderate assistance   Problem: RH PAIN MANAGEMENT Goal: RH STG PAIN MANAGED AT OR BELOW PT'S PAIN GOAL Outcome: Progressing   Problem: RH KNOWLEDGE DEFICIT LIMB LOSS Goal: RH STG INCREASE KNOWLEDGE OF SELF CARE AFTER LIMB LOSS Outcome: Progressing

## 2019-08-19 NOTE — Progress Notes (Signed)
[place] PHYSICAL MEDICINE & REHABILITATION PROGRESS NOTE   Subjective/Complaints:  Pt reports RUE shaking- when sitting up. When seen initially,  Pt was very sedated/out of it, forgot to take CPAP mask off to talk to me- admits was dazed.  Saw him again at 10:30- pt more awake, however admits has been really lightheaded sitting up with therapy and actual has B/L UE shakiness, esp when sits up.  Today is his anniversary of 71 years with wife. Wants to get blood transfusion we discussed with him, since incision still oozing while on heparin gtt, and Hb down to 7.3. Just doesn't want it to get in way of wife coming in. Focused on wife.  Suggested scheduling tramadol to help with pain- 50 mg TID was scheduled.     ROS- denies CP, SOB, N/V/D  Objective:   Ct Head Code Stroke Wo Contrast  Result Date: 08/18/2019 CLINICAL DATA:  Code stroke.  Aphasia and dysarthria. EXAM: CT HEAD WITHOUT CONTRAST TECHNIQUE: Contiguous axial images were obtained from the base of the skull through the vertex without intravenous contrast. COMPARISON:  08/27/2008 FINDINGS: Brain: Generalized atrophy. Mild chronic small-vessel ischemic changes of the deep white matter. Old small vessel infarction left basal ganglia. Old small vessel infarction right cerebellum. No sign of acute infarction, mass lesion, hemorrhage, hydrocephalus or extra-axial collection. Vascular: There is atherosclerotic calcification of the major vessels at the base of the brain. Skull: Normal Sinuses/Orbits: Clear/normal Other: None ASPECTS ([place] Stroke Program Early CT Score) - Ganglionic level infarction (caudate, lentiform nuclei, internal capsule, insula, M1-M3 cortex): 7 - Supraganglionic infarction (M4-M6 cortex): 3 Total score (0-10 with 10 being normal): 10 IMPRESSION: 1. No acute finding by CT. Chronic small-vessel ischemic changes of the white matter, cerebellum and left basal ganglia. 2. ASPECTS is 10 3. These results were  communicated to Dr. Lorraine Lax at 5:47 pmon 9/15/2020by text page via the [facility] messaging system. Electronically Signed   By: Nelson Chimes M.D.   On: 08/18/2019 17:49   Recent Labs    08/18/19 0333 08/19/19 0436  WBC 10.9* 10.6*  HGB 7.5* 7.3*  HCT 24.4* 22.7*  PLT 298 250   Recent Labs    08/17/19 1851 08/18/19 0333  NA 133* 132*  K 4.6 4.7  CL 98 95*  CO2 25 26  GLUCOSE 263* 373*  BUN 43* 47*  CREATININE 1.59* 1.72*  CALCIUM 8.8* 8.8*    Intake/Output Summary (Last 24 hours) at 08/19/2019 1215 Last data filed at 08/19/2019 0848 Gross per 24 hour  Intake 1608.95 ml  Output 4058 ml  Net -2449.05 ml     Physical Exam: Vital Signs Blood pressure (!) 140/43, pulse 60, temperature 98.6 F (37 C), temperature source Oral, resp. rate 14, height 5\' 11"  (1.803 m), weight 101.2 kg, SpO2 98 %.  Nursing noteand vitalsreviewed. Constitutional: asleep, was awake, alert and appropriate but somewhat dazed, vague, out of it initially, more alert 2nd visit with PA, NAD HENT:  Head:Normocephalicand atraumatic.  Eyes:EOMare normal.  Neck:Normal range of motion.No tracheal deviationpresent. No thyromegalypresent.  Cardiovascular:Normal rate.An irregularly irregular rhythmpresent.  No murmurheard. +click Respiratory:Effort normal. Norespiratory distress. He hasno wheezes.  [id number]. NT, ND, (+)BS  Musculoskeletal:  General: less TTP over L AKA- still oozing actively bloody drainage in between staples; no significant erythema seen; maybe a hematoma more medially; on heparin gtt  Neurological: He isalertand oriented to person, place, and time (while awake). UE shaking when held outright- like a little weak.; able to lift L AKA against  gravity UE 5/6. RLE 4/5 prox to distal.. No sensory deficits Skin: Skin iswarm. Incision with staples intact--still draining. Psychiatric:somewhat dazed, slow to answer; better 2nd visit    Assessment/Plan: 1. Functional  deficits secondary to new L AKA due to ischemia/gangrene which require 3+ hours per day of interdisciplinary therapy in a comprehensive inpatient rehab setting.  Physiatrist is providing close team supervision and 24 hour management of active medical problems listed below.  Physiatrist and rehab team continue to assess barriers to discharge/monitor patient progress toward functional and medical goals  Care Tool:  Bathing    Body parts bathed by patient: Right arm, Left arm, Chest, Abdomen, Front perineal area, Right upper leg, Left upper leg, Face   Body parts bathed by helper: Buttocks, Right lower leg Body parts n/a: Left lower leg   Bathing assist Assist Level: Moderate Assistance - Patient 50 - 74%     Upper Body Dressing/Undressing Upper body dressing   What is the patient wearing?: Hospital gown only    Upper body assist Assist Level: Minimal Assistance - Patient > 75%    Lower Body Dressing/Undressing Lower body dressing      What is the patient wearing?: Hospital gown only     Lower body assist Assist for lower body dressing: Minimal Assistance - Patient > 75%     Toileting Toileting    Toileting assist Assist for toileting: Minimal Assistance - Patient > 75%(urinal only)     Transfers Chair/bed transfer  Transfers assist  Chair/bed transfer activity did not occur: Safety/medical concerns        Locomotion Ambulation   Ambulation assist   Ambulation activity did not occur: Safety/medical concerns(labial BP and HR sitting EOB.)          Walk 10 feet activity   Assist  Walk 10 feet activity did not occur: Safety/medical concerns(labial BP and HR sitting EOB.)        Walk 50 feet activity   Assist Walk 50 feet with 2 turns activity did not occur: Safety/medical concerns(labial BP and HR sitting EOB.)         Walk 150 feet activity   Assist Walk 150 feet activity did not occur: Safety/medical concerns(labial BP and HR sitting  EOB.)         Walk 10 feet on uneven surface  activity   Assist Walk 10 feet on uneven surfaces activity did not occur: Safety/medical concerns(labial BP and HR sitting EOB.)         Wheelchair     Assist     Wheelchair activity did not occur: Safety/medical concerns(labial BP and HR sitting EOB.)         Wheelchair 50 feet with 2 turns activity    Assist    Wheelchair 50 feet with 2 turns activity did not occur: Safety/medical concerns(labial BP and HR sitting EOB.)       Wheelchair 150 feet activity     Assist  Wheelchair 150 feet activity did not occur: Safety/medical concerns(labial BP and HR sitting EOB.)       Blood pressure (!) 140/43, pulse 60, temperature 98.6 F (37 C), temperature source Oral, resp. rate 14, height 5\' 11"  (1.803 m), weight 101.2 kg, SpO2 98 %.  Medical Problem List and Plan: 1.Fxnl and mobility deficitssecondary to PAD causing left AKA -admit to inpatient rehab 2.Mechanical AVR/Antithrombotics: -DVT/anticoagulation:Pharmaceutical:Coumadin and Heparin gtttill INR> 2.5. -antiplatelet therapy: N/A  9/15- INR 1.5- on Heparin gtt until INR 2.5- pharmacy monitoring daily.  Hb dropped  from 8.7 to 7.5- daily checks -might need transfusion.  9/16- Hb down to 7.3 and actively bleeding/oozing- will give 1 unit pRBCs- PA to help schedule type and cross, etc. 3. Pain Management:Still has a lot of pain--phantomas well assurgical post op pain and ongoing issues with muscle spasms.Schedule low dose flexeril tid to help with spasms.-May need neurontin titrate upwarrds. -consider scheduling oxycodone in AM prior to therapies  9/16- since so dazed, scheduled tramadol 50 mg TID and will use oxy only when needs it. 4. Mood:LCSW to follow for evaluation and support. -antipsychotic agents: N/A 5. Neuropsych: This patientiscapable of making decisions on hisown behalf. 6.  Skin/Wound Care:Monitor wound daily. -Increase dressing changes to bid due to amount of serosanguinous drainage that is ongoing. 7. Fluids/Electrolytes/Nutrition:Monitor I/O. Check lytes in am.  8. T2DM: Monitor BS ac/hs. Continue Lantus 40 mg bid with Victoza--resume amaryl.   9/15- increase Lantus to 44 units BID CBG (last 3)  Recent Labs    08/18/19 1647 08/18/19 2104 08/19/19 0622  GLUCAP 205* 232* 144*    9. CAD/CAF: Monitor for presyncope/syncope as back on Entresto.   9/15- lightheaded today, however BP didn't show orthostasis, just on low side.   9/16- had a code stroke called last night- could have been cardiac in origin- will call Cards and get EKG to reassess pt considering his need for CABG/on heparin gtt/bleeding/low Hb/etc 10 Chronic systolic CHF: Heart healthy diet and monitor for signs of overload. Check weight daily. On Entresto, Demadex, metoprolol and fenofibrate--wife to bring in [place].    Filed Weights   08/18/19 0424 08/19/19 0459  Weight: 101.3 kg 101.2 kg   11. Urinary retention: Reports voiding without difficulty. Will order PVRs X 3 to monitor. 12. Constipation:Will start bowel program as this has been an issues since first surgery.   9/15- on Miralax BID and Senokot-s 2 tabs BID- monitor closely. 13. Hyponatremia: Recheck labs in am.  14. Acute on chronic anemia: H/H is stable. Rise in WBC noted--monitor for sign of infecton.   9/15- Hb 7.5 from 8.7- monitor again [facility]  9/16- will transfuse 1 unit pRBCs today and recheck in AM 15. OSA: CPAP when sleeping with 2 L bleed in.  16. Acute on chronic renal failure:Trending back to baseline? Monitor for recurrent presyncope.  9/15- Cr up to 1.72 from 1.59 and BUN 47 up from 42- will encourage fluids, and monitor- recheck Thursday.  9/16- labs in AM    LOS: 2 days A FACE TO FACE EVALUATION WAS PERFORMED  Roldan Laforest 08/19/2019, 12:15 PM

## 2019-08-19 NOTE — Progress Notes (Signed)
Occupational Therapy Session Note  Patient Details  Name: Joseph Hoffman MRN: [id number] Date of Birth: November 15, 1951  Today's Date: 08/19/2019 OT Individual Time: 1120-1200 OT Individual Time Calculation (min): 40 min  and Today's Date: 08/19/2019 OT Missed Time: 20 Minutes Missed Time Reason: Other (comment)(lab tests, EKG tests)   Short Term Goals: Week 1:  OT Short Term Goal 1 (Week 1): Pt will be able to transfer to [facility] and or toilet with mod A using LRAD. OT Short Term Goal 2 (Week 1): Pt will be able to bathe LB with min A. OT Short Term Goal 3 (Week 1): Pt will be able to dress LB with mod A. OT Short Term Goal 4 (Week 1): Pt will be able to maintain static stand for 1 minute with mod A to pull clothing over hips.  Skilled Therapeutic Interventions/Progress Updates:    Pt received in bed in awake and alert and in much better condition than yesterday.  He was fully oriented and answering questions well, but still had some general confusion.  Discussed taking a light therapy day as he is scheduled to have a transfusion later today due to low hemoglobin. Pt agreeable to attempt to sit EOB for bathing.  Began to sit pt up when lab arrived for stat labs,  He began to sit up a 2nd time and then EKG test arrived.   Pt finally sat up on 3rd attempt. Prior to sitting up he said his L leg was 7/10 and aching terribly. Once sitting, said it was 2/10.  CGA with static and min-mod with dynamic as he was bathing UB.  Intially he needed increased A to sit and tried to have pt use RUE to hold bed rail for support.  He could not maintain his grasp, as he was tremoring and kept flipping his hand off the rail.  Asked him to focus on grasping rail but he had great difficulty, not so much from strength but more so from motor impersistence and decreased awareness.    Attempted scooting in bed to move closer to pillow before laying down but pt unable to elevate his hips high enough for an adequate scoot.  +2 A  to move pt into supine and scoot him up in the bed.     He engaged fairly well in LB bathing.  Pt donned gown and was set up in bed with all needs met.  Bed alarm set.    Therapy Documentation Precautions:  Precautions Precautions: Fall Precaution Comments: monitor HR, sats and BP Restrictions Weight Bearing Restrictions: Yes LLE Weight Bearing: Non weight bearing Other Position/Activity Restrictions: L AKA - in ace wrap.  no drainage seen through wrap  General PT Missed Treatment Reason: Patient fatigue;Other (Comment)(lethargy) Vital Signs: Therapy Vitals Pulse Rate: 60 BP: (!) 140/43 Oxygen Therapy SpO2: 98 % O2 Device: Bi-PAP O2 Flow Rate (L/min): 4 L/min FiO2 (%): 32 % Pain: Pain Assessment Pain Score: 7  Pain Type: Surgical pain Pain Location: Leg Pain Orientation: Left Pain Descriptors / Indicators: Aching;Burning Pain Onset: On-going Pain Intervention(s): RN made aware   Therapy/Group: Individual Therapy  [place] 08/19/2019, 11:28 AM

## 2019-08-19 NOTE — Progress Notes (Signed)
ANTICOAGULATION CONSULT NOTE - Follow Up Consult  Pharmacy Consult for Heparin + warfarin Indication: Mechanical valve  Patient Measurements: Height: 5\' 11"  (180.3 cm) Weight: 223 lb 1.7 oz (101.2 kg) IBW/kg (Calculated) : 75.3 Heparin Dosing Weight:   Vital Signs: Temp: 98.6 F (37 C) (09/16 0459) Temp Source: Oral (09/16 0459) BP: 144/61 (09/16 0459) Pulse Rate: 66 (09/16 0459)  Labs: Recent Labs    08/17/19 0555 08/17/19 1851 08/18/19 0333 08/19/19 0436  HGB 8.7*  --  7.5* 7.3*  HCT 27.8*  --  24.4* 22.7*  PLT 356  --  298 250  LABPROT 16.5*  --  17.8* 19.0*  INR 1.4*  --  1.5* 1.6*  HEPARINUNFRC 0.40 0.31 0.39 0.31  CREATININE  --  1.59* 1.72*  --     Estimated Creatinine Clearance: 48.4 mL/min (A) (by C-G formula based on SCr of 1.72 mg/dL (H)).  Assessment: 68 year old male on warfarin for mechanical AVR who received INR reversal (Vitamin K 5mg  IV) on 9/9 for an AKA. Patient is on IV Heparin per pharmacy dosing consult and Warfarin was resumed per pharmacy dosing on 9/12. PTA warfarin dose - 10 mg daily except 12.5mg  TTS, admit INR 2.7 (goal 2.5-3.5)  Today INR remains subtherapeutic at 1.6 but is trending up slowly. Heparin remains therapeutic but was briefly held yesterday during rapid response episode. CT head negative for hemorrhage.   Goal of Therapy:  INR 2.5-3.5 Monitor platelets by anticoagulation protocol: Yes   Plan:  Continue IV heparin 1500 units/hr Repeat Warfarin 12.5mg  x 1 tonight Daily HL, CBC, and INR Bridging with IV heparin until INR >2.5  Salome Arnt, PharmD, BCPS Please see AMION for all pharmacy numbers 08/19/2019 7:46 AM

## 2019-08-19 NOTE — Progress Notes (Signed)
  Progress Note    08/19/2019 8:43 AM   Subjective:  Says his leg hurts  Afebrile HR 60's  [id number] systolic [id number] BiPap   Vitals:   08/19/19 0459 08/19/19 0810  BP: (!) 144/61   Pulse: 66   Resp: 14   Temp: 98.6 F (37 C)   SpO2: 100% 98%    Physical Exam: Incisions:  Looks good with mild ecchymosis center posterior.  There is a mild bloody ooze from the incision   CBC    Component Value Date/Time   WBC 10.6 (H) 08/19/2019 0436   RBC 2.68 (L) 08/19/2019 0436   HGB 7.3 (L) 08/19/2019 0436   HGB 13.8 04/03/2018 0809   HCT 22.7 (L) 08/19/2019 0436   HCT 42.4 04/03/2018 0809   PLT 250 08/19/2019 0436   PLT 168 04/03/2018 0809   MCV 84.7 08/19/2019 0436   MCV 84 04/03/2018 0809   MCH 27.2 08/19/2019 0436   MCHC 32.2 08/19/2019 0436   RDW 18.2 (H) 08/19/2019 0436   RDW 15.5 (H) 04/03/2018 0809   LYMPHSABS 1.0 08/18/2019 0333   MONOABS 1.3 (H) 08/18/2019 0333   EOSABS 0.5 08/18/2019 0333   BASOSABS 0.1 08/18/2019 0333    BMET    Component Value Date/Time   NA 132 (L) 08/18/2019 0333   NA 139 03/13/2019 1511   K 4.7 08/18/2019 0333   CL 95 (L) 08/18/2019 0333   CO2 26 08/18/2019 0333   GLUCOSE 373 (H) 08/18/2019 0333   BUN 47 (H) 08/18/2019 0333   BUN 42 (H) 03/13/2019 1511   CREATININE 1.72 (H) 08/18/2019 0333   CREATININE 1.06 08/23/2014 0853   CALCIUM 8.8 (L) 08/18/2019 0333   GFRNONAA 39 (L) 08/18/2019 0333   GFRAA 46 (L) 08/18/2019 0333    INR    Component Value Date/Time   INR 1.6 (H) 08/19/2019 0436     Intake/Output Summary (Last 24 hours) at 08/19/2019 0843 Last data filed at 08/19/2019 0502 Gross per 24 hour  Intake 1648.95 ml  Output 4058 ml  Net -2409.05 ml     Assessment/Plan:  68 y.o. male is s/p left above knee amputation  * No   -pt's incision looks good with staples in tact;  There continues to be a mild bloody ooze from the incision.  He has an acute blood loss anemia as his hgb has drifted down to 7.3.  Given he has a  mechanical heart valve and INR is still 1.5, will not stop heparin at this point.  Keep gentle pressure dressing on stump.  Ck cbc in am. -MD will evaluate later today   Leontine Locket, PA-C [facility] [phone] 08/19/2019 8:43 AM

## 2019-08-19 NOTE — Progress Notes (Signed)
Physical Therapy Session Note  Patient Details  Name: Joseph Hoffman MRN: [id number] Date of Birth: May 04, 1951  Today's Date: 08/19/2019 PT Individual Time: 0900-0945 PT Individual Time Calculation (min): 45 min   Short Term Goals: Week 1:  PT Short Term Goal 1 (Week 1): Patient will perform bed mobility with min A. PT Short Term Goal 2 (Week 1): Pt will perform bed<>chair transfer w/ mod assist +2 for safety. PT Short Term Goal 3 (Week 1): Pt will tolerate sitting OOB in between therapy sessions for 1 hour. PT Short Term Goal 4 (Week 1): Pt will self-propel w/c 67' w/ supervision PT Short Term Goal 5 (Week 1): Patient will initiate standing with mod A +2 using LRAD.  Skilled Therapeutic Interventions/Progress Updates:     Patient in bed with RN in room upon PT arrival. Patient alert and verbose this morning and agreeable to PT session. Patient reported 8/10 L posterior residual limb pain during session, RN made aware. PT provided repositioning, rest breaks, and distraction as pain interventions throughout session. Patient complained on discomfort from ACE wraps on his residual limb. PT removed wraps, noted moderate bloody drainage on dressing, RN made aware, and applied additional dressing to manage drainage, then re-wrapped his residual limb with 2-4" ACE wraps using figure eight wrap up to his groin medially and greater trochanter laterally. Educated on wrapping technique and edema control. During wrapping the patient mentioned that it was his 70th wedding anniversary today. PT asked what year he was married and he stated 35. PT then asked if he was sure it was his 70th anniversary and patient stated "yes, 68 years together." PT oriented his to this year and the year he was married and patient was able to stated, "no, my 47th anniversary." Noted several times when patient used the wrong word when he meant to say another throughout session. He sometimes would catch it and other times he was  unaware.    Therapeutic Activity: Bed Mobility: Patient's vitals in supine indicated that his HR was irregular and diastolic BP was low. Had patient rest quietly for 2 min and HR was less irregular. PT adjusted bed to chair position for 5 min, HR remained irregular and diastolic BP low in sitting, patient asymptomatic. At ~5 min in sitting patient's R hand began to tremor and he demonstrated mild weakness with shoulder flexion on the R. Patient was returned to supine, vitals remained the same, however the tremor and R sided weakness resolved. Patient was alert, asymptomatic, and verbose discussing baseball and football throughout session. RN made aware of vitals and symptoms during session. Patient missed 15 min of skilled PT due to irregular HR and BP, indicating mobility would not be safe for this patient at this time, RN in agreement.  Vitals: Supine: BP 127/54 HR 81-154 (irregular) Supine after 2 min with patient relaxed: 137/53 HR 60-80  Sitting chair position in bed: BP 125/56 HR 60-86  Supine: BP 126/43 HR 25-67, manual HR 61 with irregular rhythm   Patient in bed at end of session with breaks locked, bed alarm set, and all needs within reach.    Therapy Documentation Precautions:  Precautions Precautions: Fall Precaution Comments: monitor HR, sats and BP Restrictions Weight Bearing Restrictions: Yes LLE Weight Bearing: Non weight bearing Other Position/Activity Restrictions: L AKA - in ace wrap.  no drainage seen through wrap General: PT Amount of Missed Time (min): 30 Minutes PT Missed Treatment Reason: Patient fatigue;Other (Comment)(lethargy)    Therapy/Group: Individual Therapy  Vondra Aldredge L Brittie Whisnant PT, DPT  08/19/2019, 12:52 PM

## 2019-08-19 NOTE — Progress Notes (Signed)
Social Work Patient ID: Joseph Hoffman, male   DOB: Feb 17, 1951, 68 y.o.   MRN: [id number]  Met with pt and wife who is here due to it is their anniversary of 27 years. Pt is much better today and able to converse. Wife had questions regarding his medical issues. Have asked RN to talk with wife while she is here today. Pt will need to get mod/i before going home due to wife uses a walker and has many health issues of her own. Hopefully pt will improve and be able to reach mod/i level

## 2019-08-19 NOTE — Progress Notes (Signed)
Occupational Therapy Session Note  Patient Details  Name: Joseph Hoffman MRN: [id number] Date of Birth: 01/22/1951  Today's Date: 08/19/2019 OT Individual Time: [id number] Individual Time Calculation (min): 30 min  and Today's Date: 08/19/2019 OT Missed Time: 15 Minutes Missed Time Reason: Nursing care;Other (comment)(EKG)   Short Term Goals: Week 1:  OT Short Term Goal 1 (Week 1): Pt will be able to transfer to [facility] and or toilet with mod A using LRAD. OT Short Term Goal 2 (Week 1): Pt will be able to bathe LB with min A. OT Short Term Goal 3 (Week 1): Pt will be able to dress LB with mod A. OT Short Term Goal 4 (Week 1): Pt will be able to maintain static stand for 1 minute with mod A to pull clothing over hips.  Skilled Therapeutic Interventions/Progress Updates:    Pt missed 15 mins skilled OT services for EKG and nursing care.  Pt resting in bed upon arrival.  Pt oriented and with some recollection of previous days events. OT intervention with focus on bed mobility and sitting EOB.  Pt required mod A for supine>sit EOB and for repositioning in bed. Pt maintained sitting balance EOB with close supervision. No reports of dizziness. Pt returned to supine with min A. Pt remained in bed with all needs within reach and bed alarm activated.   Therapy Documentation Precautions:  Precautions Precautions: Fall Precaution Comments: monitor HR, sats and BP Restrictions Weight Bearing Restrictions: Yes LLE Weight Bearing: Non weight bearing Other Position/Activity Restrictions: L AKA - in ace wrap.  no drainage seen through wrap General: General OT Amount of Missed Time: 15 Minutes Pain: Pt c/p "20/10" pain in LLE; repositioned and emotional support  Therapy/Group: Individual Therapy  [name] 08/19/2019, 2:44 PM

## 2019-08-19 NOTE — Progress Notes (Signed)
Physical Therapy Session Note  Patient Details  Name: Joseph Hoffman MRN: [id number] Date of Birth: 1951-07-16  Today's Date: 08/19/2019     Short Term Goals: Week 1:  PT Short Term Goal 1 (Week 1): Patient will perform bed mobility with min A. PT Short Term Goal 2 (Week 1): Pt will perform bed<>chair transfer w/ mod assist +2 for safety. PT Short Term Goal 3 (Week 1): Pt will tolerate sitting OOB in between therapy sessions for 1 hour. PT Short Term Goal 4 (Week 1): Pt will self-propel w/c 64' w/ supervision PT Short Term Goal 5 (Week 1): Patient will initiate standing with mod A +2 using LRAD.  Skilled Therapeutic Interventions/Progress Updates: Pt missed 30 min skilled therapy due to lethargy. Upon PTA arrival pt receiving tx from respiratory therapist. Pt immediately falling back asleep. PTA able to arpuse pt using sternal rub and raising bed however pt unable to stay awake for more than a few seconds. BP checked with pt in supine with pt able to follow command to raise right arm. BP 136/50 MAP 73 pulse 58. Pt left in bed with alarm on.      Therapy Documentation Precautions:  Precautions Precautions: Fall Precaution Comments: monitor HR, sats and BP Restrictions Weight Bearing Restrictions: Yes LLE Weight Bearing: Non weight bearing Other Position/Activity Restrictions: L AKA - in ace wrap.  no drainage seen through wrap General: PT Amount of Missed Time (min): 30 Minutes PT Missed Treatment Reason: Patient fatigue;Other (Comment)(lethargy) Vital Signs: Oxygen Therapy SpO2: 98 % O2 Device: Bi-PAP O2 Flow Rate (L/min): 4 L/min FiO2 (%): 32 %  Therapy/Group: Individual Therapy  [name] 08/19/2019, 9:04 AM

## 2019-08-20 ENCOUNTER — Inpatient Hospital Stay (HOSPITAL_COMMUNITY): Payer: Medicare Other

## 2019-08-20 ENCOUNTER — Encounter (HOSPITAL_COMMUNITY): Payer: Self-pay | Admitting: Physician Assistant

## 2019-08-20 ENCOUNTER — Inpatient Hospital Stay (HOSPITAL_COMMUNITY): Payer: Medicare Other | Admitting: Occupational Therapy

## 2019-08-20 DIAGNOSIS — R7309 Other abnormal glucose: Secondary | ICD-10-CM

## 2019-08-20 DIAGNOSIS — Z794 Long term (current) use of insulin: Secondary | ICD-10-CM

## 2019-08-20 DIAGNOSIS — N183 Chronic kidney disease, stage 3 (moderate): Secondary | ICD-10-CM

## 2019-08-20 DIAGNOSIS — D62 Acute posthemorrhagic anemia: Secondary | ICD-10-CM

## 2019-08-20 DIAGNOSIS — Z7901 Long term (current) use of anticoagulants: Secondary | ICD-10-CM

## 2019-08-20 DIAGNOSIS — I351 Nonrheumatic aortic (valve) insufficiency: Secondary | ICD-10-CM

## 2019-08-20 DIAGNOSIS — E1122 Type 2 diabetes mellitus with diabetic chronic kidney disease: Secondary | ICD-10-CM

## 2019-08-20 DIAGNOSIS — I4819 Other persistent atrial fibrillation: Secondary | ICD-10-CM

## 2019-08-20 DIAGNOSIS — I5023 Acute on chronic systolic (congestive) heart failure: Secondary | ICD-10-CM

## 2019-08-20 DIAGNOSIS — I2510 Atherosclerotic heart disease of native coronary artery without angina pectoris: Secondary | ICD-10-CM

## 2019-08-20 DIAGNOSIS — R7401 Elevation of levels of liver transaminase levels: Secondary | ICD-10-CM

## 2019-08-20 DIAGNOSIS — R740 Nonspecific elevation of levels of transaminase and lactic acid dehydrogenase [LDH]: Secondary | ICD-10-CM

## 2019-08-20 DIAGNOSIS — I10 Essential (primary) hypertension: Secondary | ICD-10-CM

## 2019-08-20 DIAGNOSIS — Z952 Presence of prosthetic heart valve: Secondary | ICD-10-CM

## 2019-08-20 LAB — TYPE AND SCREEN
ABO/RH(D): A POS
Antibody Screen: NEGATIVE
Unit division: 0

## 2019-08-20 LAB — COMPREHENSIVE METABOLIC PANEL
ALT: 49 U/L — ABNORMAL HIGH (ref 0–44)
AST: 59 U/L — ABNORMAL HIGH (ref 15–41)
Albumin: 2.5 g/dL — ABNORMAL LOW (ref 3.5–5.0)
Alkaline Phosphatase: 40 U/L (ref 38–126)
Anion gap: 13 (ref 5–15)
BUN: 50 mg/dL — ABNORMAL HIGH (ref 8–23)
CO2: 23 mmol/L (ref 22–32)
Calcium: 9 mg/dL (ref 8.9–10.3)
Chloride: 99 mmol/L (ref 98–111)
Creatinine, Ser: 1.66 mg/dL — ABNORMAL HIGH (ref 0.61–1.24)
GFR calc Af Amer: 48 mL/min — ABNORMAL LOW (ref >60)
GFR calc non Af Amer: 41 mL/min — ABNORMAL LOW (ref >60)
Glucose, Bld: 335 mg/dL — ABNORMAL HIGH (ref 70–99)
Potassium: 4.7 mmol/L (ref 3.5–5.1)
Sodium: 135 mmol/L (ref 135–145)
Total Bilirubin: 0.8 mg/dL (ref 0.3–1.2)
Total Protein: 6.3 g/dL — ABNORMAL LOW (ref 6.5–8.1)

## 2019-08-20 LAB — CBC
HCT: 26.7 % — ABNORMAL LOW (ref 39.0–52.0)
Hemoglobin: 8.2 g/dL — ABNORMAL LOW (ref 13.0–17.0)
MCH: 26.4 pg (ref 26.0–34.0)
MCHC: 30.7 g/dL (ref 30.0–36.0)
MCV: 85.9 fL (ref 80.0–100.0)
Platelets: 296 10*3/uL (ref 150–400)
RBC: 3.11 MIL/uL — ABNORMAL LOW (ref 4.22–5.81)
RDW: 17.7 % — ABNORMAL HIGH (ref 11.5–15.5)
WBC: 9.7 10*3/uL (ref 4.0–10.5)
nRBC: 0.2 % (ref 0.0–0.2)

## 2019-08-20 LAB — PROTIME-INR
INR: 1.9 — ABNORMAL HIGH (ref 0.8–1.2)
Prothrombin Time: 21.2 seconds — ABNORMAL HIGH (ref 11.4–15.2)

## 2019-08-20 LAB — BPAM RBC
Blood Product Expiration Date: 202010122359
ISSUE DATE / TIME: 202009161419
Unit Type and Rh: 6200

## 2019-08-20 LAB — HEPARIN LEVEL (UNFRACTIONATED)
Heparin Unfractionated: 0.11 IU/mL — ABNORMAL LOW (ref 0.30–0.70)
Heparin Unfractionated: 0.37 IU/mL (ref 0.30–0.70)

## 2019-08-20 LAB — GLUCOSE, CAPILLARY
Glucose-Capillary: 180 mg/dL — ABNORMAL HIGH (ref 70–99)
Glucose-Capillary: 224 mg/dL — ABNORMAL HIGH (ref 70–99)
Glucose-Capillary: 161 mg/dL — ABNORMAL HIGH (ref 70–99)
Glucose-Capillary: 424 mg/dL — ABNORMAL HIGH (ref 70–99)
Glucose-Capillary: 379 mg/dL — ABNORMAL HIGH (ref 70–99)
Glucose-Capillary: 332 mg/dL — ABNORMAL HIGH (ref 70–99)

## 2019-08-20 MED ORDER — LIRAGLUTIDE 18 MG/3ML ~~LOC~~ SOPN
1.8000 mg | PEN_INJECTOR | Freq: Every day | SUBCUTANEOUS | Status: DC
  Administered 2019-08-20 – 2019-09-14 (×22): 1.8 mg via SUBCUTANEOUS
  Filled 2019-08-20 (×3): qty 3

## 2019-08-20 MED ORDER — METOPROLOL SUCCINATE ER 25 MG PO TB24
25.0000 mg | ORAL_TABLET | Freq: Every day | ORAL | Status: DC
  Administered 2019-08-21 – 2019-08-24 (×4): 25 mg via ORAL
  Filled 2019-08-20 (×4): qty 1

## 2019-08-20 MED ORDER — WARFARIN SODIUM 7.5 MG PO TABS
12.5000 mg | ORAL_TABLET | Freq: Once | ORAL | Status: AC
  Administered 2019-08-20: 12.5 mg via ORAL
  Filled 2019-08-20: qty 1

## 2019-08-20 NOTE — Consult Note (Addendum)
Cardiology Consultation:   Patient ID: VAHIN DISCHER; [id number]; 02/27/1951   Admit date: 08/17/2019 Date of Consult: 08/20/2019  Primary Care Provider: Orpah Melter, MD Primary Cardiologist: Shelva Majestic, MD Primary Electrophysiologist:  None  Chief Complaint: recent amputation  Patient Profile:   Joseph Hoffman is a 68 y.o. male with a hx of permanent atrial fibrillation, chronic combined CHF, mechanical AVR in 2013, HTN, HLD (statin intolerant), OSA on CPAP, DM type 2, CKD stage IV, and hepatitis who is being seen today for the evaluation of cardiac medical management at the request of Dr. Posey Pronto.  History of Present Illness:   Over past few months he's had a steady downward course with multiple admissions for CP, CHF, and a nonhealing diabetic foot ulcer. In 05/2019, nuclear stress test showed a decline in LVEF, but no ischemia. Given drop in LVEF and CHF, he underwent TEE 05/29/19 which showed EF 30-35%, diffuse hypokinesis, severely reduced RV function, severe biatrial enlargement, and severe perivalvular AI. His CHF medications were adjusted with initial clinical improvement. He was transitioned to torsemide for acute on chronic biventricular combined CHF with a plan for outpatient surgical evaluation. He was scheduled for a cardiac catheterization 07/02/2019 with tentative plans for redo AVR with [name] 07/06/2019. However, before that could occur, he was admitted with fever and diabetic foot ulcer requiring vascular evaluation. He was managed by the vascular team and underwent an LE angiogram but unable to revascularize flow. He underwent left great toe amputation on 7/31. During that admission he also underwent cath showing diffuse diabetic CAD, RHC with elevated filling pressures and preserved output. Based on TEE and clinical information, his AI was not felt related to endocarditis. There had been progression since 2019. He was felt to require redo AVR by TCTS +/- redo CABG but due  to his foot infection, this was deferred pending recovery. Unfortunately his toe amputation site did not heal so required left BKA on 8/28. Hospital course was complicated by lethargy and confusion thought to be secondary to narcotics, hypotensions and AKI. Delene Loll was temporarily discontinued. His left BKA site did not heal well and he ultimately required left AKA on 08/13/19. He was subsequently discharged to CIR and we are asked to assist in medication management of his cardiac disease.  On 08/18/19 he had an episode where he was "oriented to place/situation, disoriented to his age and birth date, his speech was slurred, RUE and RLE weakness, he moved his LLE (AKA) better than his RLE, + ataxia, he could not recollect events or people (like where he is from or his wife's name)." He was given Narcan and neurology was called. Stat CT of the head was nonacute. It was recommended to minimize narcotics. On 08/19/19 he was seen by physical med MD for RUE shaking when sitting up and was very sedated/out of it on initial eval, then much more awake on re-eval.   The first time I interviewed patient, I had awoken him from sleep. He appeared fully awake. However, when I asked him simple questions such as how he was doing, he would begin to answer them with 3-4 words then pause and stare for long periods of silence. He was redirectable but would then answer with "holy mackerel" and not be able to answer the question at all. He initially could tell me where he was or what year it was. He did know his name easily. As the interview went on he was then able to recall he was in  the hospital. Exam was otherwise nonfocal. I stepped out to request to speak with rehab to determine if this was his baseline. When we revisited the patient he was totally lucid. In speaking with Algis Liming, it sounds like he has been this way upon waking in the past. Last labs showed Hgb 8.2 (irecent range 7-9), Cr 1.66 (appears generally stable), mildly  abnormal AST/ALT, albumin 2.5, INR 1.9. He is on heparin->warfarin. HR appears controlled. BP low-normal today, 108/47 but trend has been 114-140's. Some oozing at stump site reported in vascular notes.   Past Medical History:  Diagnosis Date   Anxiety    Arthritis    Asthma    CAD (coronary artery disease)    Chronic combined systolic and diastolic CHF (congestive heart failure) (HCC)    CKD (chronic kidney disease), stage IV (HCC)    Diabetic foot ulcer ([place])    DM (diabetes mellitus) ([place]) 09/30/2012   Hepatitis B 2003   Hx of AKA (above knee amputation), left (HCC)    Hyperlipidemia    Hypertension    Myocardial infarction ([place])    Permanent atrial fibrillation, since 1994    S/P AVR (aortic valve replacement), 09/30/2012   a. s/p mechanical AVR in 09/2012 (on Coumadin)   Severe aortic insufficiency    Sleep apnea    uses CPAP    Past Surgical History:  Procedure Laterality Date   ABDOMINAL ANGIOGRAM  09/18/2012   Procedure: ABDOMINAL ANGIOGRAM;  Surgeon: Troy Sine, MD;  Location: [facility] CATH LAB;  Service: Cardiovascular;;   ABDOMINAL AORTOGRAM W/LOWER EXTREMITY N/A 07/02/2019   Procedure: ABDOMINAL AORTOGRAM W/LOWER EXTREMITY;  Surgeon: Marty Heck, MD;  Location: [place] CV LAB;  Service: Cardiovascular;  Laterality: N/A;   AMPUTATION Left 07/03/2019   Procedure: AMPUTATION LEFT GREAT TOE;  Surgeon: Marty Heck, MD;  Location: [place];  Service: Vascular;  Laterality: Left;   AMPUTATION Left 07/31/2019   Procedure: AMPUTATION BELOW KNEE LEFT;  Surgeon: Marty Heck, MD;  Location: [place];  Service: Vascular;  Laterality: Left;   AMPUTATION Left 08/13/2019   Procedure: LEFT AMPUTATION ABOVE KNEE;  Surgeon: Marty Heck, MD;  Location: [place];  Service: Vascular;  Laterality: Left;   AORTIC VALVE REPLACEMENT  09/29/2012   Procedure: AORTIC VALVE REPLACEMENT (AVR);  Surgeon: Gaye Pollack, MD;  Location: [place];   Service: Open Heart Surgery;  Laterality: N/A;   ARCH AORTOGRAM  09/18/2012   Procedure: ARCH AORTOGRAM;  Surgeon: Troy Sine, MD;  Location: [facility] CATH LAB;  Service: Cardiovascular;;   CARDIAC CATHETERIZATION  09/18/12   severe calcific aortic stenosis, peak gradient 32mm, mean gradient 12mm, EF 45%   CARDIAC VALVE REPLACEMENT     AVR 09-29-12   CHOLECYSTECTOMY     FRACTURE SURGERY     LEFT AND RIGHT HEART CATHETERIZATION WITH CORONARY ANGIOGRAM N/A 09/18/2012   Procedure: LEFT AND RIGHT HEART CATHETERIZATION WITH CORONARY ANGIOGRAM;  Surgeon: Troy Sine, MD;  Location: [facility] CATH LAB;  Service: Cardiovascular;  Laterality: N/A;   PERIPHERAL VASCULAR INTERVENTION Left 07/02/2019   Procedure: PERIPHERAL VASCULAR INTERVENTION;  Surgeon: Marty Heck, MD;  Location: [place] CV LAB;  Service: Cardiovascular;  Laterality: Left;   RIGHT HEART CATH AND CORONARY ANGIOGRAPHY N/A 06/29/2019   Procedure: RIGHT HEART CATH AND CORONARY ANGIOGRAPHY;  Surgeon: Nelva Bush, MD;  Location: [place] CV LAB;  Service: Cardiovascular;  Laterality: N/A;   TEE WITHOUT CARDIOVERSION N/A 05/29/2019   Procedure: TRANSESOPHAGEAL  ECHOCARDIOGRAM (TEE);  Surgeon: Lelon Perla, MD;  Location: [facility] ENDOSCOPY;  Service: Cardiovascular;  Laterality: N/A;     Inpatient Medications: Scheduled Meds:  B-complex with vitamin C  1 tablet Oral Daily   bethanechol  10 mg Oral QID   cholecalciferol  1,000 Units Oral Q breakfast   cyclobenzaprine  5 mg Oral TID   digoxin  0.125 mg Oral Daily   docusate sodium  100 mg Oral BID   escitalopram  10 mg Oral QHS   ezetimibe  10 mg Oral Daily   feeding supplement (PRO-STAT SUGAR FREE 64)  30 mL Oral BID   fenofibrate  160 mg Oral Q breakfast   gabapentin  300 mg Oral TID   glimepiride  2 mg Oral Q breakfast   hydrocortisone cream   Topical TID   Icosapent Ethyl  2 g Oral BID   insulin aspart  0-20 Units Subcutaneous TID WC    insulin aspart  0-5 Units Subcutaneous QHS   insulin glargine  44 Units Subcutaneous BID   liraglutide  1.8 mg Subcutaneous QHS   metoprolol succinate  25 mg Oral BID   milk and molasses  1 enema Rectal Once   mometasone-formoterol  2 puff Inhalation BID   pantoprazole  40 mg Oral Daily   polyethylene glycol  17 g Oral BID   Ensure Max Protein  11 oz Oral Daily   saccharomyces boulardii  250 mg Oral 3 times weekly   sacubitril-valsartan  1 tablet Oral BID   senna-docusate  2 tablet Oral BID   tamsulosin  0.8 mg Oral QPC supper   torsemide  20 mg Oral Daily   torsemide  40 mg Oral Daily   traMADol  50 mg Oral TID   vitamin C  500 mg Oral BID   warfarin  12.5 mg Oral ONCE-1800   Warfarin - Pharmacist Dosing Inpatient   Does not apply q1800   Continuous Infusions:  heparin 1,800 Units/hr (08/20/19 1234)   PRN Meds: acetaminophen, albuterol, alum & mag hydroxide-simeth, bisacodyl, guaiFENesin-dextromethorphan, lidocaine, nitroGLYCERIN, oxyCODONE, polyethylene glycol, prochlorperazine **OR** prochlorperazine **OR** prochlorperazine, sodium chloride flush, sodium phosphate, traZODone  Home Meds: Prior to Admission medications   Medication Sig Start Date End Date Taking? Authorizing Provider  acetaminophen (TYLENOL) 325 MG tablet Take 2 tablets (650 mg total) by mouth every 4 (four) hours as needed for pain or fever. Patient taking differently: Take 650 mg by mouth every 4 (four) hours as needed for fever or headache (pain).  10/16/12   Erlene Quan, PA-C  acetaminophen (TYLENOL) 325 MG tablet Take 1-2 tablets (325-650 mg total) by mouth every 4 (four) hours as needed for mild pain. 08/18/19   Love, Ivan Anchors, PA-C  albuterol (PROVENTIL HFA;VENTOLIN HFA) 108 (90 BASE) MCG/ACT inhaler Inhale 2 puffs into the lungs every 6 (six) hours as needed for wheezing or shortness of breath.     [provider]  B Complex-C (B-COMPLEX WITH VITAMIN C) tablet Take 1 tablet by  mouth daily. 08/06/19   Swayze, Ava, DO  B-D ULTRAFINE III SHORT PEN 31G X 8 MM MISC  10/04/13   [provider]  cholecalciferol (VITAMIN D) 1000 UNITS tablet Take 1,000 Units by mouth daily with breakfast.     [provider]  digoxin (LANOXIN) 0.125 MG tablet TAKE 1 TABLET EVERY DAY Patient taking differently: Take 0.125 mg by mouth daily.  01/21/19   Troy Sine, MD  EASY TOUCH LANCETS 30G/TWIST [place]  11/05/13  [provider]  escitalopram (LEXAPRO) 10 MG tablet Take 10 mg by mouth at bedtime.     [provider]  ezetimibe (ZETIA) 10 MG tablet Take 1 tablet (10 mg total) by mouth daily. 02/09/19 07/26/22  Almyra Deforest, PA  fenofibrate 160 MG tablet Take 1 tablet (160 mg total) by mouth daily. Patient taking differently: Take 160 mg by mouth daily with breakfast.  05/16/19   Black, Lezlie Octave, NP  Fluticasone-Salmeterol (ADVAIR) 250-50 MCG/DOSE AEPB Inhale 1 puff into the lungs 2 (two) times daily as needed (shortness of breath/asthma related symptoms).     [provider]  gabapentin (NEURONTIN) 300 MG capsule Take 1 capsule (300 mg total) by mouth 3 (three) times daily. 08/05/19   Swayze, Ava, DO  heparin 25000-0.45 UT/250ML-% infusion Inject 2,650 Units/hr into the vein continuous. 08/05/19   Swayze, Ava, DO  Icosapent Ethyl (VASCEPA) 1 g CAPS Take 2 capsules (2 g total) by mouth 2 (two) times daily. Patient taking differently: Take 2 g by mouth 2 (two) times daily with a meal.  04/20/19   Troy Sine, MD  insulin glargine (LANTUS) 100 UNIT/ML injection Inject 15-40 Units into the skin See admin instructions. Inject 15 - 40 units subcutaneously daily before breakfast (adjusted per CBG); inject 30-40 units at bedtime ( occasionally adjusted per CBG - usually 35 units)    [provider]  liraglutide (VICTOZA) 18 MG/3ML SOPN Inject 1.8 mg into the skin at bedtime.    [provider]  metoprolol succinate (TOPROL XL) 25 MG 24 hr tablet Take 1  tablet (25 mg total) by mouth 2 (two) times daily. 07/10/19   [place] Donnamarie Poag, DO  nitroGLYCERIN (NITROSTAT) 0.4 MG SL tablet Place 1 tablet (0.4 mg total) under the tongue every 5 (five) minutes as needed for chest pain. Patient taking differently: Place 0.4 mg under the tongue every 5 (five) minutes x 3 doses as needed for chest pain.  05/30/19   [place] Shi, MD  oxyCODONE-acetaminophen (PERCOCET) 10-325 MG tablet Take 1 tablet by mouth every 4 (four) hours as needed for pain. 08/05/19   Swayze, Ava, DO  saccharomyces boulardii (FLORASTOR) 250 MG capsule Take 250 mg by mouth 3 (three) times a week.     [provider]  sacubitril-valsartan (ENTRESTO) 24-26 MG Take 1 tablet by mouth 2 (two) times daily. 07/10/19   [place] Donnamarie Poag, DO  tamsulosin (FLOMAX) 0.4 MG CAPS capsule Take 1 capsule (0.4 mg total) by mouth daily after supper. 08/05/19   Swayze, Ava, DO  torsemide (DEMADEX) 20 MG tablet Take 2 tablets (40 mg total) by mouth every morning. Patient taking differently: Take 20-40 mg by mouth See admin instructions. Take 2 tablets (40 mg) by mouth daily in the morning & take 1 tablet (20 mg) by mouth at night 07/11/19   [place] Donnamarie Poag, DO  vitamin C (ASCORBIC ACID) 500 MG tablet Take 500 mg by mouth 2 (two) times daily.    [provider]  warfarin (COUMADIN) 7.5 MG tablet Take 2 tablets (15 mg total) by mouth one time only at 6 PM. 08/05/19   Swayze, Ava, DO    Allergies:    Allergies  Allergen Reactions   Iohexol Anaphylaxis   Niacin And Related     Flushing with immediate realese   Penicillins Other (See Comments)    Unknown.[name]  Did it involve swelling of the face/tongue/throat, SOB, or low BP? Unknown Did it involve sudden or severe  rash/hives, skin peeling, or any reaction on the inside of your mouth or nose? Unknown Did you need to seek medical attention at a hospital or doctor's office? Unknown When did it last happen?Childhood If all  above answers are NO, may proceed with cephalosporin use.    Social History:   Social History   Socioeconomic History   Marital status: Married    Spouse name: Joseph Hoffman   Number of children: 3   Years of education: 15+   Highest education level: Not on file  Occupational History    Employer: [employer]  Social Needs   Financial resource strain: Not on file   Food insecurity    Worry: Not on file    Inability: Not on file   Transportation needs    Medical: Not on file    Non-medical: Not on file  Tobacco Use   Smoking status: Former Smoker    Packs/day: 0.25    Years: 10.00    Pack years: 2.50    Types: Cigarettes    Quit date: 09/25/1982    Years since quitting: 36.9   Smokeless tobacco: Former Network engineer and Sexual Activity   Alcohol use: Yes    Comment: occ. beer/wine   Drug use: No   Sexual activity: Yes    Birth control/protection: None  Lifestyle   Physical activity    Days per week: Not on file    Minutes per session: Not on file   Stress: Not on file  Relationships   Social connections    Talks on phone: Not on file    Gets together: Not on file    Attends religious service: Not on file    Active member of club or organization: Not on file    Attends meetings of clubs or organizations: Not on file    Relationship status: Not on file   Intimate partner violence    Fear of current or ex partner: Not on file    Emotionally abused: Not on file    Physically abused: Not on file    Forced sexual activity: Not on file  Other Topics Concern   Not on file  Social History Narrative   Patient is married Joseph Hoffman) and lives with his wife and son.   Patient has three children.   Patient is working full-time.   Patient has a college education.   Patient is right handed.   Patient drinks about 2-3 cups of coffee daily.     Family History:    Family History  Problem Relation Age of Onset   Hypertension Mother    Diabetes Father     Heart attack Brother 47   Hyperlipidemia Brother 48       stents placed      ROS:  Please see the history of present illness.  All other ROS reviewed and negative.     Physical Exam/Data:   Vitals:   08/19/19 2010 08/20/19 0310 08/20/19 0813 08/20/19 1514  BP: (!) 121/35 (!) 114/52  (!) 108/47  Hoffman: (!) 50 (!) 53  72  Resp: 18 18  20   Temp: 98.1 F (36.7 C) 98.6 F (37 C)  98.2 F (36.8 C)  TempSrc: Oral     SpO2: 99% 99% 98% 95%  Weight:      Height:        Intake/Output Summary (Last 24 hours) at 08/20/2019 1659 Last data filed at 08/20/2019 1645 Gross per 24 hour  Intake 1300 ml  Output  3800 ml  Net -2500 ml   Last 3 Weights 08/19/2019 08/18/2019 08/15/2019  Weight (lbs) 223 lb 1.7 oz 223 lb 5.2 oz 225 lb 15.5 oz  Weight (kg) 101.2 kg 101.3 kg 102.5 kg     Body mass index is 31.12 kg/m.  General: Pale obese WM, in no acute distress. Head: Normocephalic, atraumatic, sclera non-icteric, no xanthomas, nares are without discharge.  Neck: Negative for carotid bruits. JVD not elevated. Lungs: Clear bilaterally to auscultation without wheezes, rales, or rhonchi. Breathing is unlabored. Heart: RRR with S1 S2. No murmurs, rubs, or gallops appreciated. Abdomen: Soft, non-tender, non-distended with normoactive bowel sounds. No hepatomegaly. No rebound/guarding. No obvious abdominal masses. Msk:  Strength and tone appear normal for age. Extremities: No clubbing or cyanosis. No edema.  Distal pedal pulses are 2+ and equal bilaterally. Neuro: Initial eval, oriented to self only with deficits noted above. On re-eval several minutes after waking up, he is now more lucid, A+Ox3 and able to provide a better history. Strength equal bilaterally, no facial asymmetry or pupil asymmetry Psych: Flat, bewildered affect initially  EKG:  The EKG was personally reviewed and demonstrates:  Atrial fib 78bpm, NSIVCD, prior inferior infarct, nonspecific STT changes  Telemetry:  No telemetry  available on this floor  Relevant CV Studies: 1. Cath 06/29/19 Conclusions: 1. Multivessel coronary artery disease, including 50% mid LAD, 80% large OM2, 50% mid RCA, and 80-90% ostial rPDA stenoses. 2. Mildly to moderately elevated left and right heart filling pressures. 3. Mildly reduced Fick cardiac output/index. Recommendations: 1. Gentle post-cath hydration for goal net even fluid balance today.  Consider gentle diuresis, as renal function tolerates, beginning tomorrow. 2. Ongoing workup for redo aortic valve replacement +/- CABG per Drs. Claiborne Billings and [place]. 3. Initiate heparin infusion 2 hours after TR band removal. 4. Aggressive secondary prevention. Nelva Bush, MD [facility] Pager: [phone]  TEE 05/29/19 IMPRESSIONS   1. The left ventricle has moderate-severely reduced systolic function, with an ejection fraction of 30-35%. Left ventricular diffuse hypokinesis.  2. The right ventricle has severely reduced systolic function. The cavity was mildly enlarged.  3. Left atrial size was severely dilated.  4. Right atrial size was severely dilated.  5. The mitral valve is grossly normal. No evidence of mitral valve stenosis.  6. The tricuspid valve was grossly normal.  7. Aortic valve regurgitation is severe by color flow Doppler.  8. There is evidence of mild plaque in the descending aorta.  9. Moderate to severe global reduction in LV systolic function; mild RVE with severe RV dysfunction; severe biatrial enlargement; s/p mechanical AVR with mean gradient of 14 mmHg (both leaflets appear to be mobile); severe perivalvular AI; mild MR and TR.  Laboratory Data:  High Sensitivity Troponin:  No results for input(s): TROPONINIHS in the last 720 hours.   Cardiac EnzymesNo results for input(s): TROPONINI in the last 168 hours. No results for input(s): TROPIPOC in the last 168 hours.  Chemistry Recent Labs  Lab 08/17/19 1851 08/18/19 0333 08/20/19 0949  NA 133* 132* 135    K 4.6 4.7 4.7  CL 98 95* 99  CO2 25 26 23   GLUCOSE 263* 373* 335*  BUN 43* 47* 50*  CREATININE 1.59* 1.72* 1.66*  CALCIUM 8.8* 8.8* 9.0  GFRNONAA 43* 39* 41*  GFRAA 50* 46* 48*  ANIONGAP 10 11 13     Recent Labs  Lab 08/18/19 0333 08/20/19 0949  PROT 5.9* 6.3*  ALBUMIN 2.4* 2.5*  AST 38 59*  ALT  30 49*  ALKPHOS 41 40  BILITOT 0.6 0.8   Hematology Recent Labs  Lab 08/18/19 0333 08/19/19 0436 08/20/19 0949  WBC 10.9* 10.6* 9.7  RBC 2.83* 2.68* 3.11*  HGB 7.5* 7.3* 8.2*  HCT 24.4* 22.7* 26.7*  MCV 86.2 84.7 85.9  MCH 26.5 27.2 26.4  MCHC 30.7 32.2 30.7  RDW 18.3* 18.2* 17.7*  PLT 298 250 296    Radiology/Studies:  Ct Head Code Stroke Wo Contrast  Result Date: 08/18/2019 CLINICAL DATA:  Code stroke.  Aphasia and dysarthria. EXAM: CT HEAD WITHOUT CONTRAST TECHNIQUE: Contiguous axial images were obtained from the base of the skull through the vertex without intravenous contrast. COMPARISON:  08/27/2008 FINDINGS: Brain: Generalized atrophy. Mild chronic small-vessel ischemic changes of the deep white matter. Old small vessel infarction left basal ganglia. Old small vessel infarction right cerebellum. No sign of acute infarction, mass lesion, hemorrhage, hydrocephalus or extra-axial collection. Vascular: There is atherosclerotic calcification of the major vessels at the base of the brain. Skull: Normal Sinuses/Orbits: Clear/normal Other: None ASPECTS ([place] Stroke Program Early CT Score) - Ganglionic level infarction (caudate, lentiform nuclei, internal capsule, insula, M1-M3 cortex): 7 - Supraganglionic infarction (M4-M6 cortex): 3 Total score (0-10 with 10 being normal): 10 IMPRESSION: 1. No acute finding by CT. Chronic small-vessel ischemic changes of the white matter, cerebellum and left basal ganglia. 2. ASPECTS is 10 3. These results were communicated to Dr. Lorraine Lax at 5:47 pmon 9/15/2020by text page via the [facility] messaging system. Electronically Signed   By: Nelson Chimes  M.D.   On: 08/18/2019 17:49    Assessment and Plan:   1. Diabetic foot ulcer infection s/p L toe amputation with subsequent nonhealing, then L BKA with nonhealing, then L AKA with nonhealing - Also noted to have chronic L internal carotid occlusion. Followed by vascular, slow healing, oozing issues.   2. Chronic combined biventricular CHF - clinically appears euvolemic. [name] indicates there was some concern about rising BUN/Cr. Per review of Epic in 2020 his renal function actually appears quite stable and his vitals appear to be tolerating his current med regimen, albeit with soft diastolic pressures at times. I will discuss regimen with Dr. Sallyanne Kuster.  3. Severe perivalvular AI with history of mechanical AVR 2013 -  he was pending timing of possible CABG, but still significantly debilitated with recent reinfection issues, anemia, intermittent confusion, and now AKA. He does not presently appear to be an operative candidate, may need TCTS to weigh in on future plans.  4. Multivessel CAD - denies any angina - see above regarding surgery. I do not see he is on ASA, likely due to need for heparin->Coumadin and stump bleeding. Will review with MD. Continue BB. He is intolerant of statins per notes and has been on vascepa and Zetia. Check lipid profile in AM. Consider PCSK9 as OP.  5. Confusion - per d/w rehab PA, patient's baseline has waxed and waned, influenced by sleep and narcotics. He was more alert and coherent after giving him a few minutes to fully awaken on re-eval this afternoon. Neuro exam otherwise nonfocal. CT head 9/15 nonacute. He did not have CPAP on when he was asleep (has OSA) which may have been contributing. If this recurs would consider neurology reconsultation.  6. Persistent atrial fibrillation - rate appears controlled by vitals. He is on heparin->Coumading given his AVR. Check digoxin level with AM labs.  For questions or updates, please contact [place] Please consult  [email or web address] for contact info under  Signed, Charlie Pitter, PA-C  08/20/2019 4:59 PM    I have seen and examined the patient along with Charlie Pitter, PA-C.  I have reviewed the chart, notes and new data.  I agree with PA/NP's note.  Key new complaints: He apologized for not being entirely well coming to our physician's assistant, commenting that he had just woken up from sleep and was surprised by her presence there.  He denies any cardiovascular symptoms.  While I was interviewing him, I did notice a couple of episodes of similar sudden interruption in his speech, almost as if he was having difficulty finding a word or remembering what he was talking about.  On most occasions, if given enough time he was able to continue his train of thought, but at least on 2 occasions I have to prompt him before he started talking again.  Otherwise he appears to be oriented and shows good understanding and judgment and no memory problems. Key examination changes: Status post left above-the-knee amputation.  Regular rate and rhythm, crisp prosthetic valve clicks, grade 2/6 decrescendo aortic insufficiency murmur heard best at the left lower sternal border with very limited radiation, grade 1-2/6 brief early peaking aortic ejection murmur.  No peripheral stigmata of endocarditis.  Slightly pale.  Slight oozing from amputation stump. Key new findings / data: Reviewed echocardiograms, including TEE from June 2020, labs and notes from the heart failure service.  PLAN:  As far as I can tell he is euvolemic.  I think we need to tolerate some slight azotemia to avoid heart failure in the setting of likely to severely reduced left ventricular systolic function and severe aortic insufficiency. After his recent episodes of acute renal insufficiency, is hard to say what his baseline BUN and creatinine are.  They have been relatively stable now for a couple of weeks. Also need to reestablish what his "dry weight" is,  following his amputation. On the other hand I am a little concerned about his relative bradycardia and very broad Hoffman pressure.  His diastolic blood pressure is often in the 40s.  This is likely due to severe aortic insufficiency in the setting of long diastole/bradycardia. I have recommended reducing the dose of metoprolol succinate to 25 mg once daily. I am not sure if the low diastolic blood pressure has anything to do with his occasional difficulty in continuing his train of thought and speaking. He is worried about his family situation and his ability to rehab.  His wife also needs a knee replacement.  He would like to have a conference to discuss their situation. We will check again on him tomorrow.  Sanda Klein, MD, [place] [phone] 08/20/2019, 6:00 PM

## 2019-08-20 NOTE — Progress Notes (Signed)
ANTICOAGULATION CONSULT NOTE - Follow Up Consult  Pharmacy Consult for Heparin + warfarin Indication: Mechanical valve  Patient Measurements: Height: 5\' 11"  (180.3 cm) Weight: 223 lb 1.7 oz (101.2 kg) IBW/kg (Calculated) : 75.3 Heparin Dosing Weight:   Vital Signs: Temp: 98.6 F (37 C) (09/17 0310) BP: 114/52 (09/17 0310) Pulse Rate: 53 (09/17 0310)  Labs: Recent Labs    08/17/19 1851  08/18/19 0333 08/19/19 0436 08/20/19 0949  HGB  --    < > 7.5* 7.3* 8.2*  HCT  --   --  24.4* 22.7* 26.7*  PLT  --   --  298 250 296  LABPROT  --   --  17.8* 19.0* 21.2*  INR  --   --  1.5* 1.6* 1.9*  HEPARINUNFRC 0.31  --  0.39 0.31 0.11*  CREATININE 1.59*  --  1.72*  --  1.66*   < > = values in this interval not displayed.    Estimated Creatinine Clearance: 50.2 mL/min (A) (by C-G formula based on SCr of 1.66 mg/dL (H)).  Assessment: 68 year old male on warfarin for mechanical AVR who received INR reversal (Vitamin K 5mg  IV) on 9/9 for an AKA. Patient is on IV Heparin per pharmacy dosing consult and Warfarin was resumed per pharmacy dosing on 9/12. PTA warfarin dose - 10 mg daily except 12.5mg  TTS, admit INR 2.7 (goal 2.5-3.5)  Today INR remains subtherapeutic at 1.9 but continues to trend up nicely. Heparin is now subtherapeutic unexpectedly. RN verified that heparin is running ok and was not off at any point.   Goal of Therapy:  INR 2.5-3.5 Monitor platelets by anticoagulation protocol: Yes   Plan:  Increase IV heparin to 1800 units/hr Repeat Warfarin 12.5mg  x 1 tonight Daily HL, CBC, and INR Bridging with IV heparin until INR >2.5  Salome Arnt, PharmD, BCPS Please see AMION for all pharmacy numbers 08/20/2019 11:20 AM

## 2019-08-20 NOTE — Progress Notes (Signed)
[place] PHYSICAL MEDICINE & REHABILITATION PROGRESS NOTE   Subjective/Complaints: Patient seen laying in bed this morning.  He states he slept well overnight.  ROS: Denies CP, SOB, N/V/D  Objective:   Ct Head Code Stroke Wo Contrast  Result Date: 08/18/2019 CLINICAL DATA:  Code stroke.  Aphasia and dysarthria. EXAM: CT HEAD WITHOUT CONTRAST TECHNIQUE: Contiguous axial images were obtained from the base of the skull through the vertex without intravenous contrast. COMPARISON:  08/27/2008 FINDINGS: Brain: Generalized atrophy. Mild chronic small-vessel ischemic changes of the deep white matter. Old small vessel infarction left basal ganglia. Old small vessel infarction right cerebellum. No sign of acute infarction, mass lesion, hemorrhage, hydrocephalus or extra-axial collection. Vascular: There is atherosclerotic calcification of the major vessels at the base of the brain. Skull: Normal Sinuses/Orbits: Clear/normal Other: None ASPECTS (Gratis Stroke Program Early CT Score) - Ganglionic level infarction (caudate, lentiform nuclei, internal capsule, insula, M1-M3 cortex): 7 - Supraganglionic infarction (M4-M6 cortex): 3 Total score (0-10 with 10 being normal): 10 IMPRESSION: 1. No acute finding by CT. Chronic small-vessel ischemic changes of the white matter, cerebellum and left basal ganglia. 2. ASPECTS is 10 3. These results were communicated to Dr. Lorraine Lax at 5:47 pmon 9/15/2020by text page via the [facility] messaging system. Electronically Signed   By: Nelson Chimes M.D.   On: 08/18/2019 17:49   Recent Labs    08/19/19 0436 08/20/19 0949  WBC 10.6* 9.7  HGB 7.3* 8.2*  HCT 22.7* 26.7*  PLT 250 296   Recent Labs    08/18/19 0333 08/20/19 0949  NA 132* 135  K 4.7 4.7  CL 95* 99  CO2 26 23  GLUCOSE 373* 335*  BUN 47* 50*  CREATININE 1.72* 1.66*  CALCIUM 8.8* 9.0    Intake/Output Summary (Last 24 hours) at 08/20/2019 1303 Last data filed at 08/20/2019 1255 Gross per 24 hour  Intake  1540 ml  Output 4350 ml  Net -2810 ml     Physical Exam: Vital Signs Blood pressure (!) 114/52, pulse (!) 53, temperature 98.6 F (37 C), resp. rate 18, height 5\' 11"  (1.803 m), weight 101.2 kg, SpO2 98 %. Constitutional: No distress . Vital signs reviewed. HENT: Normocephalic.  Atraumatic. Eyes: EOMI. No discharge. Cardiovascular: No JVD. Respiratory: Normal effort.  No stridor. GI: Non-distended. Skin: Left AKA with significant serosanguineous drainage. Psych: Slowed. Musc: Left AKA with edema and tenderness. Neurological: Alert Motor: Bilateral upper extremities: 5/5 proximal distal Right lower extremity: 4/5 proximal distal Left lower extremity: 4+/5 (pain inhibition)   Assessment/Plan: 1. Functional deficits secondary to new L AKA due to ischemia/gangrene which require 3+ hours per day of interdisciplinary therapy in a comprehensive inpatient rehab setting.  Physiatrist is providing close team supervision and 24 hour management of active medical problems listed below.  Physiatrist and rehab team continue to assess barriers to discharge/monitor patient progress toward functional and medical goals  Care Tool:  Bathing    Body parts bathed by patient: Right arm, Left arm, Chest, Abdomen, Front perineal area, Right upper leg, Left upper leg, Face, Right lower leg   Body parts bathed by helper: Buttocks Body parts n/a: Left lower leg   Bathing assist Assist Level: Minimal Assistance - Patient > 75%     Upper Body Dressing/Undressing Upper body dressing   What is the patient wearing?: Hospital gown only    Upper body assist Assist Level: Minimal Assistance - Patient > 75%    Lower Body Dressing/Undressing Lower body dressing  What is the patient wearing?: Pants     Lower body assist Assist for lower body dressing: Contact Guard/Touching assist     Toileting Toileting    Toileting assist Assist for toileting: Maximal Assistance - Patient 25 -  49% Assistive Device Comment: bedpan   Transfers Chair/bed transfer  Transfers assist  Chair/bed transfer activity did not occur: Safety/medical concerns  Chair/bed transfer assist level: Minimal Assistance - Patient > 75% Chair/bed transfer assistive device: Sliding board   Locomotion Ambulation   Ambulation assist   Ambulation activity did not occur: Safety/medical concerns(labial BP and HR sitting EOB.)          Walk 10 feet activity   Assist  Walk 10 feet activity did not occur: Safety/medical concerns(labial BP and HR sitting EOB.)        Walk 50 feet activity   Assist Walk 50 feet with 2 turns activity did not occur: Safety/medical concerns(labial BP and HR sitting EOB.)         Walk 150 feet activity   Assist Walk 150 feet activity did not occur: Safety/medical concerns(labial BP and HR sitting EOB.)         Walk 10 feet on uneven surface  activity   Assist Walk 10 feet on uneven surfaces activity did not occur: Safety/medical concerns(labial BP and HR sitting EOB.)         Wheelchair     Assist Will patient use wheelchair at discharge?: Yes Type of Wheelchair: Manual Wheelchair activity did not occur: Safety/medical concerns(labial BP and HR sitting EOB.)  Wheelchair assist level: Supervision/Verbal cueing Max wheelchair distance: 130    Wheelchair 50 feet with 2 turns activity    Assist    Wheelchair 50 feet with 2 turns activity did not occur: Safety/medical concerns(labial BP and HR sitting EOB.)   Assist Level: Supervision/Verbal cueing   Wheelchair 150 feet activity     Assist  Wheelchair 150 feet activity did not occur: Safety/medical concerns(labial BP and HR sitting EOB.)       Blood pressure (!) 114/52, pulse (!) 53, temperature 98.6 F (37 C), resp. rate 18, height 5\' 11"  (1.803 m), weight 101.2 kg, SpO2 98 %.  Medical Problem List and Plan: 1.Fxnl and mobility deficitssecondary to PAD causing  left AKA  Continue CIR 2.Mechanical AVR/Antithrombotics: -DVT/anticoagulation:Pharmaceutical:Coumadin and heparin GGT INR> 2.5.  INR 1.9 on 9/17 -antiplatelet therapy: N/A 3. Pain Management:  Scheduled low dose flexeril tid to help with spasms.  May need neurontin titrate upwarrds. Scheduled tramadol 50 mg TID and will use oxy only when needs it.  Appears relatively controlled with medication on 9/17 4. Mood:LCSW to follow for evaluation and support. -antipsychotic agents: N/A 5. Neuropsych: This patientiscapable of making decisions on hisown behalf. 6. Skin/Wound Care:Monitor wound daily. Monitor stump for signs and symptoms of infection 7. Fluids/Electrolytes/Nutrition:Monitor I/Os 8. T2DM: Monitor BS ac/hs. Continue Lantus 40 mg bid with Victoza--resume amaryl.   9/15- increase Lantus to 44 units BID CBG (last 3)  Recent Labs    08/19/19 1703 08/19/19 2037 08/20/19 0625  GLUCAP 180* 224* 161*   Extremely labile with CBGs greater than 400 on 9/17  Will monitor today, will likely require further adjustments tomorrow 9. CAD/CAF: Monitor for presyncope/syncope as back on Entresto.   Awaiting further Cards recs 10 Chronic systolic CHF: Heart healthy diet and monitor for signs of overload. Check weight daily. On Entresto, Demadex, metoprolol and fenofibrate--wife to bring in [place].    Filed Weights   08/18/19 0424  08/19/19 0459  Weight: 101.3 kg 101.2 kg   Stable on 9/17 11. Urinary retention:   PVRs elevated  Continue Flomax 12. Constipation:Start bowel program as this has been an issues since first surgery.  13.  Acute blood loss anemia  Transfused 1 unit PRBC on 9/16  Hemoglobin 8.2 on 9/17  Continue to monitor 13. Hyponatremia:   Sodium 135 on 9/17  Continue to monitor 14. OSA: CPAP when sleeping with 2 L bleed in.  15. Acute on chronic renal failure:   Creatinine 1.66 on 9/17  Encourage  fluids  Continue to monitor 16.  Transaminitis  LFTs elevated on 9/17  Continue to monitor  LOS: 3 days A FACE TO FACE EVALUATION WAS PERFORMED  Gauri Galvao Lorie Phenix 08/20/2019, 1:03 PM

## 2019-08-20 NOTE — Progress Notes (Signed)
Occupational Therapy Session Note  Patient Details  Name: Joseph Hoffman MRN: [id number] Date of Birth: 1951-09-23  Today's Date: 08/20/2019 OT Individual Time: [id number] OT Individual Time Calculation (min): 43 min  and Today's Date: 08/20/2019 OT Missed Time: 17 Minutes Missed Time Reason: Pain;Patient fatigue   Short Term Goals: Week 1:  OT Short Term Goal 1 (Week 1): Pt will be able to transfer to [facility] and or toilet with mod A using LRAD. OT Short Term Goal 2 (Week 1): Pt will be able to bathe LB with min A. OT Short Term Goal 3 (Week 1): Pt will be able to dress LB with mod A. OT Short Term Goal 4 (Week 1): Pt will be able to maintain static stand for 1 minute with mod A to pull clothing over hips.  Skilled Therapeutic Interventions/Progress Updates:    Treatment session with focus on functional mobility and sit > stand.  Pt received supine in bed reporting pain at 20/10.  RN notified for pain meds, administered halfway through session.  Pt reporting sometimes unsure as to whether things are really happening or not, also reports some initial confusion which seems to be improving.  Pt donned shorts in supine with rolling to pull shorts over hips.  Min cues for sequencing of rolling and max assist sidelying to sitting ?due to pain.  Engaged in multiple attempts of sit > stand with pt pushing up from bed with LUE (per pt preference) unable to achieve full upright stance despite max assist +2.  Alternated to pushing up from be with RUE while pushing through RW with LUE with pt able to achieve upright standing with mod assist and min assist from 2nd person.  Pt declined attempting another stand, however reports pleased with progress.  Pt pleased that he was able to stand and feels more confident in his ability to continue to improve.  Pt returned to supine and doffed shorts. Pt left semi-reclined with all needs in reach.  Therapy Documentation Precautions:  Precautions Precautions:  Fall Precaution Comments: monitor HR, sats and BP Restrictions Weight Bearing Restrictions: Yes LLE Weight Bearing: Non weight bearing Other Position/Activity Restrictions: L AKA - in ace wrap.  no drainage seen through wrap General: General OT Amount of Missed Time: 17 Minutes Pain: Pain Assessment Pain Score: 0-No pain   Therapy/Group: Individual Therapy  Simonne Come 08/20/2019, 3:00 PM

## 2019-08-20 NOTE — Progress Notes (Signed)
Occupational Therapy Session Note  Patient Details  Name: Joseph Hoffman MRN: [id number] Date of Birth: [birth year]  Today's Date: 08/20/2019 OT Individual Time: 1050-1200 OT Individual Time Calculation (min): 70 min    Short Term Goals: Week 1:  OT Short Term Goal 1 (Week 1): Pt will be able to transfer to [facility] and or toilet with mod A using LRAD. OT Short Term Goal 2 (Week 1): Pt will be able to bathe LB with min A. OT Short Term Goal 3 (Week 1): Pt will be able to dress LB with mod A. OT Short Term Goal 4 (Week 1): Pt will be able to maintain static stand for 1 minute with mod A to pull clothing over hips.  Skilled Therapeutic Interventions/Progress Updates:    Pt received in bed stating she felt much better and did not have any leg pain.  He was energetic and ready to get going with therapy.  Today his RUE was functioning well.  Pt worked on rolling in bed to self cleanse (A with bottom), don shorts and sock.  Rewrapped L limb. Sat to EOB and completed slide board with min A/ CGA to w.c. completed UB self care and grooming at sink and then transferred back to bed.  He did have intermittent dizziness, but it would resolve with some rest.   Pt in bed with all needs met.     Therapy Documentation Precautions:  Precautions Precautions: Fall Precaution Comments: monitor HR, sats and BP Restrictions Weight Bearing Restrictions: Yes LLE Weight Bearing: Non weight bearing Other Position/Activity Restrictions: L AKA - in ace wrap.  no drainage seen through wrap      Pain: Pain Assessment Pain Score: 0-No pain ADL: ADL Grooming: Independent Where Assessed-Grooming: Wheelchair Upper Body Bathing: Setup Where Assessed-Upper Body Bathing: Sitting at sink Lower Body Bathing: Contact guard Where Assessed-Lower Body Bathing: Bed level Upper Body Dressing: Minimal assistance Where Assessed-Upper Body Dressing: Bed level Lower Body Dressing: Contact guard Where Assessed-Lower Body  Dressing: Bed level   Therapy/Group: Individual Therapy  [place] 08/20/2019, 12:19 PM

## 2019-08-20 NOTE — IPOC Note (Addendum)
Overall Plan of Care [facility]) Patient Details Name: Joseph Hoffman MRN: [id number] DOB: [birth year]  Admitting Diagnosis: Above knee amputation of left lower extremity [facility])  Hospital Problems: Principal Problem:   Above knee amputation of left lower extremity [facility]) Active Problems:   S/P AVR, 09/29/12, [employer]. (discharged 10/05/12)   Permanent atrial fibrillation, since 1994   Type 2 IDDM   Chronic anticoagulation   Acute on chronic systolic heart failure, re-admitted 10/12/12   Essential hypertension   CRI (chronic renal insufficiency), stage 3 (moderate) (HCC)   CAD, multiple vessel     Functional Problem List: Nursing Bladder, Endurance, Motor, Pain, Safety  PT Balance, Edema, Endurance, Motor, Pain, Perception, Safety, Nutrition  OT Balance, Endurance, Pain, Motor  SLP    TR         Basic ADL's: OT Grooming, Bathing, Dressing, Toileting     Advanced  ADL's: OT       Transfers: PT Bed Mobility, Bed to Chair, Car, [employer], Floor  OT Toilet, Tub/Shower     Locomotion: PT Ambulation, Emergency planning/management officer, Stairs     Additional Impairments: OT None  SLP        TR      Anticipated Outcomes Item Anticipated Outcome  Self Feeding I  Swallowing      Basic self-care  Mod I  Toileting  Mod I   Bathroom Transfers Mod I to toilet, S to shower  Bowel/Bladder  Manage bladder and bowel with min. assist  Transfers     Locomotion     Communication     Cognition     Pain  Less than 3,on 1 to 10 scale  Safety/Judgment  Keep pt. free from falls during his stay in rehab   Therapy Plan: PT Intensity: Minimum of 1-2 x/day ,45 to 90 minutes PT Frequency: 5 out of 7 days PT Duration Estimated Length of Stay: 2.5-3 weeks OT Intensity: Minimum of 1-2 x/day, 45 to 90 minutes OT Frequency: 5 out of 7 days OT Duration/Estimated Length of Stay: 14-17 days     Due to the current state of emergency, patients may not be receiving their 3-hours of  Medicare-mandated therapy.   Team Interventions: Nursing Interventions Patient/Family Education, Bladder Management, Skin Care/Wound Management, Pain Management  PT interventions Ambulation/gait training, Community reintegration, DME/adaptive equipment instruction, Neuromuscular re-education, Psychosocial support, Stair training, UE/LE Strength taining/ROM, Wheelchair propulsion/positioning, UE/LE Coordination activities, Therapeutic Activities, Skin care/wound management, Pain management, Functional electrical stimulation, Discharge planning, Training and development officer, Disease management/prevention, Functional mobility training, Patient/family education, Splinting/orthotics, Therapeutic Exercise  OT Interventions Balance/vestibular training, Discharge planning, Disease mangement/prevention, Pain management, Functional mobility training, DME/adaptive equipment instruction, Patient/family education, Psychosocial support, Self Care/advanced ADL retraining, Skin care/wound managment, UE/LE Strength taining/ROM, Therapeutic Exercise, Therapeutic Activities  SLP Interventions    TR Interventions    SW/CM Interventions  Psychsocial Assessment, Pt & Family Education and Discharge Planning   Barriers to Discharge MD  Medical stability  Nursing      PT Medical stability Labial BP and HR during PT evaluation, rapid response called code stroke on 9/15, CT negative per chart.  OT Decreased caregiver support wife is a Education officer, environmental but has physical limitations  SLP      SW       Team Discharge Planning: Destination: PT-Home ,OT- Home , SLP-  Projected Follow-up: PT-Home health PT, OT-  Home health OT, SLP-  Projected Equipment Needs: PT-To be determined, OT- 3 in 1 bedside comode, Tub/shower bench, SLP-  Equipment Details: PT-has RW  already, OT-  Patient/family involved in discharge planning: PT- Patient,  OT-Patient, SLP-   MD ELOS: 14-17 days Medical Rehab Prognosis:  Excellent Assessment: The patient  has been admitted for CIR therapies with the diagnosis of left AKA. The team will be addressing functional mobility, strength, stamina, balance, safety, adaptive techniques and equipment, self-care, bowel and bladder mgt, patient and caregiver education, pain mgt, pre-prosthetic education, wound care. Goals have been set at mod I for mobility and self-care at w/c level. Limited gait goals  Due to the current state of emergency, patients may not be receiving their 3 hours per day of Medicare-mandated therapy.    Meredith Staggers, MD, FAAPMR      See Team Conference Notes for weekly updates to the plan of care

## 2019-08-20 NOTE — Progress Notes (Signed)
Pt able to place himself on CPAP when ready for bed. Water level checked, 3L O2 bled in, and machine within patients reach. Advised pt to notify for RT if any further assistance is needed.

## 2019-08-20 NOTE — Progress Notes (Signed)
   Patient still having some oozing and wound left AKA site.  Dressing is clean and dry today.  Overall progressing with therapy well.  Will follow peripherally.   Maytal Mijangos C. Donzetta Matters, MD [facility] [place] Office: [phone] Pager: [phone]

## 2019-08-20 NOTE — Progress Notes (Signed)
Physical Therapy Session Note  Patient Details  Name: Joseph Hoffman MRN: [id number] Date of Birth: [birth year]  Today's Date: 08/20/2019 PT Individual Time: 0802-0858 PT Individual Time Calculation (min): 56 min   Short Term Goals: Week 1:  PT Short Term Goal 1 (Week 1): Patient will perform bed mobility with min A. PT Short Term Goal 2 (Week 1): Pt will perform bed<>chair transfer w/ mod assist +2 for safety. PT Short Term Goal 3 (Week 1): Pt will tolerate sitting OOB in between therapy sessions for 1 hour. PT Short Term Goal 4 (Week 1): Pt will self-propel w/c 72' w/ supervision PT Short Term Goal 5 (Week 1): Patient will initiate standing with mod A +2 using LRAD. Week 2:    Week 3:     Skilled Therapeutic Interventions/Progress Updates:      Therapy Documentation Precautions:  Precautions Precautions: Fall Precaution Comments: monitor HR, sats and BP Restrictions Weight Bearing Restrictions: Yes LLE Weight Bearing: Non weight bearing Other Position/Activity Restrictions: L AKA - in ace wrap.  no drainage seen through wrap   Pain:  L residual limb 5/10, repositioned and rewrapped limb, treatment to tolerance.  Pt seen BS for treatment this am.  Initially supine w/residual limb unwrapped.  New abd pad applied and figure * acewrapping to limb by therapist.  BP in supine 115/56, HR 59  Supine to side to sit w/mod assist of 1 and use of rails. Static sit w/cga only.  Pt c/o mild dizzyness  Performed LAQ and ankle pumps in sitting  BP sitting91/69, HR 88  Sliding board transfer bed to wc w/set up, verbal cues, and mod assist of 1. BP following transfer     95/56, HR 62   wc propulsion 155ft w/bilat UE's and verbal cues for efficiency of stroke including turning half way at 43ft. BP following wc propulsion   136/52  wc to bed via sliding board transfer w/set up assist , verbal cues for sequencing, and mod assist of 1  Sit to supine w/cga and use of rails.  Rolls L and R  w/rails and min assist.  Pt much more alert and with significant improvement in activity tolerance.  Mild dizzyness and orthosatic hypotension, but improved BP w/activity.  Pt very pleased with beign able to participate and be OOB today.     Therapy/Group: Individual Therapy  Jerrilyn Cairo 08/20/2019, 11:21 AM

## 2019-08-20 NOTE — Progress Notes (Addendum)
ANTICOAGULATION CONSULT NOTE - Follow Up Consult  Pharmacy Consult for Heparin + warfarin Indication: Mechanical valve  Patient Measurements: Height: 5\' 11"  (180.3 cm) Weight: 223 lb 1.7 oz (101.2 kg) IBW/kg (Calculated) : 75.3 Heparin Dosing Weight:   Vital Signs: Temp: 98 F (36.7 C) (09/17 1935) BP: 117/40 (09/17 1935) Pulse Rate: 57 (09/17 1935)  Labs: Recent Labs    08/18/19 0333 08/19/19 0436 08/20/19 0949 08/20/19 2112  HGB 7.5* 7.3* 8.2*  --   HCT 24.4* 22.7* 26.7*  --   PLT 298 250 296  --   LABPROT 17.8* 19.0* 21.2*  --   INR 1.5* 1.6* 1.9*  --   HEPARINUNFRC 0.39 0.31 0.11* 0.37  CREATININE 1.72*  --  1.66*  --     Estimated Creatinine Clearance: 50.2 mL/min (A) (by C-G formula based on SCr of 1.66 mg/dL (H)).  Assessment: 68 year old male on warfarin for mechanical AVR who received INR reversal (Vitamin K 5mg  IV) on 9/9 for an AKA. Patient is on IV Heparin per pharmacy dosing consult and Warfarin was resumed per pharmacy dosing on 9/12. PTA warfarin dose - 10 mg daily except 12.5mg  TTS, admit INR 2.7 (goal 2.5-3.5)  Today INR remains subtherapeutic at 1.9 but continues to trend up nicely. Warfarin 12.5mg  dose given earlier tonight.  PM update - Heparin level now therapeutic after rate increase. No active bleed issues documented.   Goal of Therapy:  INR 2.5-3.5 Monitor platelets by anticoagulation protocol: Yes   Plan:  Continue IV heparin at 1800 units/hr Confirmatory heparin level with AM labs Daily Heparin level, CBC, INR, and monitor for s/sx bleeding Bridging with IV heparin until INR >2.5  [name], PharmD, BCPS Please check AMION for all [place] contact numbers Clinical Pharmacist 08/20/2019 10:08 PM

## 2019-08-21 ENCOUNTER — Ambulatory Visit: Payer: Self-pay | Admitting: *Deleted

## 2019-08-21 ENCOUNTER — Inpatient Hospital Stay (HOSPITAL_COMMUNITY): Payer: Medicare Other

## 2019-08-21 ENCOUNTER — Inpatient Hospital Stay (HOSPITAL_COMMUNITY): Payer: Medicare Other | Admitting: Occupational Therapy

## 2019-08-21 DIAGNOSIS — R0989 Other specified symptoms and signs involving the circulatory and respiratory systems: Secondary | ICD-10-CM

## 2019-08-21 LAB — CBC
HCT: 27.3 % — ABNORMAL LOW (ref 39.0–52.0)
Hemoglobin: 8.4 g/dL — ABNORMAL LOW (ref 13.0–17.0)
MCH: 26 pg (ref 26.0–34.0)
MCHC: 30.8 g/dL (ref 30.0–36.0)
MCV: 84.5 fL (ref 80.0–100.0)
Platelets: 323 10*3/uL (ref 150–400)
RBC: 3.23 MIL/uL — ABNORMAL LOW (ref 4.22–5.81)
RDW: 17.6 % — ABNORMAL HIGH (ref 11.5–15.5)
WBC: 10.6 10*3/uL — ABNORMAL HIGH (ref 4.0–10.5)
nRBC: 0 % (ref 0.0–0.2)

## 2019-08-21 LAB — DIGOXIN LEVEL: Digoxin Level: 0.4 ng/mL — ABNORMAL LOW (ref 0.8–2.0)

## 2019-08-21 LAB — LIPID PANEL
Cholesterol: 112 mg/dL (ref 0–200)
HDL: 19 mg/dL — ABNORMAL LOW (ref >40)
LDL Cholesterol: 69 mg/dL (ref 0–99)
Total CHOL/HDL Ratio: 5.9 RATIO
Triglycerides: 122 mg/dL (ref <150)
VLDL: 24 mg/dL (ref 0–40)

## 2019-08-21 LAB — GLUCOSE, CAPILLARY
Glucose-Capillary: 204 mg/dL — ABNORMAL HIGH (ref 70–99)
Glucose-Capillary: 226 mg/dL — ABNORMAL HIGH (ref 70–99)
Glucose-Capillary: 78 mg/dL (ref 70–99)
Glucose-Capillary: 73 mg/dL (ref 70–99)

## 2019-08-21 LAB — PROTIME-INR
INR: 2 — ABNORMAL HIGH (ref 0.8–1.2)
Prothrombin Time: 22.7 seconds — ABNORMAL HIGH (ref 11.4–15.2)

## 2019-08-21 LAB — HEPARIN LEVEL (UNFRACTIONATED): Heparin Unfractionated: 0.48 IU/mL (ref 0.30–0.70)

## 2019-08-21 MED ORDER — WARFARIN SODIUM 7.5 MG PO TABS
12.5000 mg | ORAL_TABLET | Freq: Once | ORAL | Status: AC
  Administered 2019-08-21: 12.5 mg via ORAL
  Filled 2019-08-21: qty 1

## 2019-08-21 NOTE — Progress Notes (Signed)
Still with relative bradycardia and very broad pulse pressure. Too soon to see if meds change is beneficial. Will ask Cardiology consult team to reevaluate him on Monday. Please call back over the weekend with any questions  Sanda Klein, MD, [facility] [phone] office [phone] pager

## 2019-08-21 NOTE — Progress Notes (Signed)
Physical Therapy Session Note  Patient Details  Name: Joseph Hoffman MRN: [id number] Date of Birth: Jun 12, 1951  Today's Date: 08/21/2019 PT Individual Time: [id number] and 8:00-9:20 PT Individual Time Calculation (min): 30 min and 80 min   Short Term Goals: Week 1:  PT Short Term Goal 1 (Week 1): Patient will perform bed mobility with min A. PT Short Term Goal 2 (Week 1): Pt will perform bed<>chair transfer w/ mod assist +2 for safety. PT Short Term Goal 3 (Week 1): Pt will tolerate sitting OOB in between therapy sessions for 1 hour. PT Short Term Goal 4 (Week 1): Pt will self-propel w/c 75' w/ supervision PT Short Term Goal 5 (Week 1): Patient will initiate standing with mod A +2 using LRAD. Week 2:    Week 3:     Skilled Therapeutic Interventions/Progress Updates:  AM SESSION:   Pain  Pt c/o 7/10 pain in residual limb, but then continued to state the pain really "wasn't bad".  Reviewed pain scale, but pt possibly not entirely comprehending.  Treatment to tolerance.    Pt initally supine at rest.  BP 140/43 HR 67 02 sats 98 Therapist assisted pt w/donning shorts in bed, pt able to roll w/rails to assist w/pants. Supine to sit on edge of bed w/mod assist of 1.  Pt w/mild dizzyness in sitting. BP 124/40, HR 68 Performed LE therex to counteract OH symptoms,  LAQs and ankle pumps. Bed to wc via sliding board w/mod assist. Pt then stated he needed to have BM WC to [facility] w/mod assist w/sB Sit to squat w/total  assist to lower pants. Pt proceeded to have large BM Sit to squat x 8 w/max assist w/second person assist for cleaning pt/pt dependent for hygiene and dependent for donning clean brief and shorts. BSC to wc via sliding board w/mod to max assist due to fatigue. Vitals at this point in session: 124/52  HR 58  wc propulsion x 168ft w/assist to steer due to faulty alignment of wc.  Pt began to c/o dizzyness and mild tremor of face and arms noted. Pt tilted in wc and leg elevated x 6  min and symptoms resolved. Pt returned to room and nursing notified. wc to bed via sliding board transfer w/mod to max assist, sit to supine w/mod assist.  Bed alarm set and needs in reach.  Pt left w/nursing, alert and stating he felt much better in  supine.     PM SESSION: Pain:  7/10 residual limb.  Nursing in to provide pain meds.  Treatment to tolerance.   Supine to side to sit w/mod assist of 1.  Pt able to sit on edge of bed x 3 min without c/o dizzyness this pm.   Sit to stand w/RW and min assist of 2, therapist blocking R knee due to wobbles in wbing.  Stood 20 secx3, 30 sec x 2.  Rest breaks approx 2 min between efforts. Last effort, pt attempted sidestepping towards head of bed w/min assist of 1, max assist of 1 only to stabilize R knee w/weight shifting.  Sit to supine w/min assist and rail.  In supine performed bridgiing x 10, sidelying L hip extension x 10, supine hip abd/add L only x 10.  Pt left supine in bed w/needs in reach, rails up x 3, and alarm set.  Assessment:  Pt w/episode of dizzyness/tremors today following strenuous session using BSC/large BM.  Sx's alleviated w/tilting pt in wc and elevating legs x 6 min,  then  returning to bed.  Nursing aware and following up w/pt.  Pm session pt able to tolerate standing w/RW.  Therapy Documentation Precautions:  Precautions Precautions: Fall Precaution Comments: monitor HR, sats and BP Restrictions Weight Bearing Restrictions: Yes LLE Weight Bearing: Non weight bearing Other Position/Activity Restrictions: L AKA - in ace wrap.  no drainage seen through wrap    Therapy/Group: Individual Therapy  Callie Fielding, [place] 08/21/2019, 3:59 PM

## 2019-08-21 NOTE — Progress Notes (Signed)
Social Work Patient ID: Joseph Hoffman, male   DOB: [birth year], 68 y.o.   MRN: [id number] Wife called very conerned that Cardologist came in to see pt yesterday and he told him his was not a candidate for heart surgery due to heart function so much worse and that he wouled not survive the surgery. She has many questions and would like clarification and to talk with Cardologist, since pt is not always with it. Have asked [name]-PA to call wife and will leave wife's number in sticky not for Cardologist to call on Monday when see's again.

## 2019-08-21 NOTE — Progress Notes (Signed)
ANTICOAGULATION CONSULT NOTE - Follow Up Consult  Pharmacy Consult for Heparin + warfarin Indication: Mechanical valve  Patient Measurements: Height: 5\' 11"  (180.3 cm) Weight: 221 lb 9 oz (100.5 kg) IBW/kg (Calculated) : 75.3 Heparin Dosing Weight:   Vital Signs: Temp: 97.6 F (36.4 C) (09/18 0226) Temp Source: Oral (09/18 0226) BP: 111/49 (09/18 0920) Pulse Rate: 64 (09/18 0920)  Labs: Recent Labs    08/19/19 0436 08/20/19 0949 08/20/19 2112 08/21/19 0540  HGB 7.3* 8.2*  --  8.4*  HCT 22.7* 26.7*  --  27.3*  PLT 250 296  --  323  LABPROT 19.0* 21.2*  --  22.7*  INR 1.6* 1.9*  --  2.0*  HEPARINUNFRC 0.31 0.11* 0.37 0.48  CREATININE  --  1.66*  --   --     Estimated Creatinine Clearance: 50 mL/min (A) (by C-G formula based on SCr of 1.66 mg/dL (H)).  Assessment: 68 year old male on warfarin for mechanical AVR who received INR reversal (Vitamin K 5mg  IV) on 9/9 for an AKA. Patient is on IV Heparin per pharmacy dosing consult and Warfarin was resumed per pharmacy dosing on 9/12. PTA warfarin dose - 10 mg daily except 12.5mg  TTS, admit INR 2.7 (goal 2.5-3.5)  Heparin level this morning remains therapeutic (HL 0.48 << 0.37, goal of 0.3-0.7). INR today remains SUBtherapeutic though slowly trending up (INR 2 << 1.9, goal of 2.5-3.5). CBC stable - no bleeding noted.     Goal of Therapy:  Heparin level 0.3-0.7 units/ml INR 2.5-3.5 Monitor platelets by anticoagulation protocol: Yes   Plan:  - Continue Heparin at 1800 units/hr (18 ml/hr) - Repeat Warfarin 12.5 mg x 1 at 1800 today - Daily Heparin level, CBC, INR, and monitor for s/sx bleeding - Bridging with IV heparin until INR >2.5  Thank you for allowing pharmacy to be a part of this patient's care.  Alycia Rossetti, PharmD, BCPS Clinical Pharmacist Clinical phone for 08/21/2019: [phone] 08/21/2019 10:34 AM   **Pharmacist phone directory can now be found on [email or web address] ([email or web address]).  Listed under [place].

## 2019-08-21 NOTE — Progress Notes (Signed)
[place] PHYSICAL MEDICINE & REHABILITATION PROGRESS NOTE   Subjective/Complaints: Patient seen lying in bed this morning.  States he slept well overnight.  Discussed decreasing drainage with nursing.  He was seen by cardiology yesterday as well as vascular.  ROS: Denies CP, SOB, N/V/D  Objective:   No results found. Recent Labs    08/20/19 0949 08/21/19 0540  WBC 9.7 10.6*  HGB 8.2* 8.4*  HCT 26.7* 27.3*  PLT 296 323   Recent Labs    08/20/19 0949  NA 135  K 4.7  CL 99  CO2 23  GLUCOSE 335*  BUN 50*  CREATININE 1.66*  CALCIUM 9.0    Intake/Output Summary (Last 24 hours) at 08/21/2019 1050 Last data filed at 08/21/2019 0838 Gross per 24 hour  Intake 1286.72 ml  Output 3990 ml  Net -2703.28 ml     Physical Exam: Vital Signs Blood pressure (!) 111/49, pulse 64, temperature 97.6 F (36.4 C), temperature source Oral, resp. rate 17, height 5\' 11"  (1.803 m), weight 100.5 kg, SpO2 98 %. Constitutional: NAD.  Vital signs reviewed. HENT: Normocephalic.  Atraumatic. Eyes: EOMI.  No discharge. Cardiovascular: No JVD. Respiratory: Normal effort.  No stridor. GI: Non-distended. Skin: Left AKA with serosanguineous drainage, improving. Psych: Slowed. Musc: Left AKA with edema and tenderness, improving. Neurological: Alert Motor: Bilateral upper extremities: 5/5 proximal distal Right lower extremity: 4/5 proximal distal, unchanged Left lower extremity: 4+/5 (pain inhibition)   Assessment/Plan: 1. Functional deficits secondary to new L AKA due to ischemia/gangrene which require 3+ hours per day of interdisciplinary therapy in a comprehensive inpatient rehab setting.  Physiatrist is providing close team supervision and 24 hour management of active medical problems listed below.  Physiatrist and rehab team continue to assess barriers to discharge/monitor patient progress toward functional and medical goals  Care Tool:  Bathing    Body parts bathed by patient:  Right arm, Left arm, Chest, Abdomen, Front perineal area, Right upper leg, Left upper leg, Face, Right lower leg   Body parts bathed by helper: Buttocks Body parts n/a: Left lower leg   Bathing assist Assist Level: Minimal Assistance - Patient > 75%     Upper Body Dressing/Undressing Upper body dressing   What is the patient wearing?: Hospital gown only    Upper body assist Assist Level: Minimal Assistance - Patient > 75%    Lower Body Dressing/Undressing Lower body dressing      What is the patient wearing?: Pants     Lower body assist Assist for lower body dressing: Contact Guard/Touching assist     Toileting Toileting    Toileting assist Assist for toileting: Maximal Assistance - Patient 25 - 49% Assistive Device Comment: bedpan   Transfers Chair/bed transfer  Transfers assist  Chair/bed transfer activity did not occur: Safety/medical concerns  Chair/bed transfer assist level: Minimal Assistance - Patient > 75% Chair/bed transfer assistive device: Sliding board   Locomotion Ambulation   Ambulation assist   Ambulation activity did not occur: Safety/medical concerns(labial BP and HR sitting EOB.)          Walk 10 feet activity   Assist  Walk 10 feet activity did not occur: Safety/medical concerns(labial BP and HR sitting EOB.)        Walk 50 feet activity   Assist Walk 50 feet with 2 turns activity did not occur: Safety/medical concerns(labial BP and HR sitting EOB.)         Walk 150 feet activity   Assist Walk 150 feet activity  did not occur: Safety/medical concerns(labial BP and HR sitting EOB.)         Walk 10 feet on uneven surface  activity   Assist Walk 10 feet on uneven surfaces activity did not occur: Safety/medical concerns(labial BP and HR sitting EOB.)         Wheelchair     Assist Will patient use wheelchair at discharge?: Yes Type of Wheelchair: Manual Wheelchair activity did not occur: Safety/medical  concerns(labial BP and HR sitting EOB.)  Wheelchair assist level: Supervision/Verbal cueing Max wheelchair distance: 130    Wheelchair 50 feet with 2 turns activity    Assist    Wheelchair 50 feet with 2 turns activity did not occur: Safety/medical concerns(labial BP and HR sitting EOB.)   Assist Level: Supervision/Verbal cueing   Wheelchair 150 feet activity     Assist  Wheelchair 150 feet activity did not occur: Safety/medical concerns(labial BP and HR sitting EOB.)       Blood pressure (!) 111/49, pulse 64, temperature 97.6 F (36.4 C), temperature source Oral, resp. rate 17, height 5\' 11"  (1.803 m), weight 100.5 kg, SpO2 98 %.  Medical Problem List and Plan: 1.Fxnl and mobility deficitssecondary to PAD causing left AKA  Continue CIR 2.Mechanical AVR/Antithrombotics: -DVT/anticoagulation:Pharmaceutical:Coumadin and heparin GGT INR> 2.5.  Unfractionated heparin within normal range on 9/18  INR 2.0 on 9/18 -antiplatelet therapy: N/A 3. Pain Management:  Scheduled low dose flexeril tid to help with spasms.  May need neurontin titrate upwarrds. Scheduled tramadol 50 mg TID and will use oxy only when needs it.  Appears relatively controlled with medication on 9/18 4. Mood:LCSW to follow for evaluation and support. -antipsychotic agents: N/A 5. Neuropsych: This patientiscapable of making decisions on hisown behalf. 6. Skin/Wound Care:Monitor wound daily. Monitor stump for signs and symptoms of infection 7. Fluids/Electrolytes/Nutrition:Monitor I/Os 8. T2DM: Monitor BS ac/hs. Continue Lantus 40 mg bid with Victoza  Resumed amaryl 2mg    9/15- increase Lantus to 44 units BID CBG (last 3)  Recent Labs    08/20/19 2121 08/21/19 0611 08/21/19 0742  GLUCAP 226* 78 73   Extremely labile with CBGs ranging from 73-424 on 9/18  Continue to monitor for trend 9. CAD/CAF: Monitor for presyncope/syncope  as back on Entresto.   Appreciate cards recs, metoprolol decreased  Dig level low on 9/18 10 Chronic systolic CHF: Heart healthy diet and monitor for signs of overload. Check weight daily. On Entresto, Demadex, metoprolol and fenofibrate, Vascepa.    Filed Weights   08/18/19 0424 08/19/19 0459 08/21/19 0226  Weight: 101.3 kg 101.2 kg 100.5 kg   Stable on 9/18 11. Urinary retention:   PVRs elevated  Continue Flomax 12. Constipation:Start bowel program as this has been an issues since first surgery.  13.  Acute blood loss anemia  Transfused 1 unit PRBC on 9/16  Hemoglobin 8.4 on 9/15  Continue to monitor 13. Hyponatremia:   Sodium 135 on 9/17, labs ordered for Monday  Continue to monitor 14. OSA: CPAP when sleeping with 2 L bleed in.  15. Acute on chronic renal failure:   Creatinine 1.66 on 9/17, labs ordered for Monday  Encourage fluids  Continue to monitor 16.  Transaminitis  LFTs elevated on 9/17, labs ordered for Monday  Continue to monitor  LOS: 4 days A FACE TO FACE EVALUATION WAS PERFORMED   Lorie Phenix 08/21/2019, 10:50 AM

## 2019-08-21 NOTE — Progress Notes (Signed)
Occupational Therapy Session Note  Patient Details  Name: Joseph Hoffman MRN: [id number] Date of Birth: 1951-09-22  Today's Date: 08/21/2019 OT Individual Time: 1045-1200 OT Individual Time Calculation (min): 75 min    Short Term Goals: Week 1:  OT Short Term Goal 1 (Week 1): Pt will be able to transfer to [facility] and or toilet with mod A using LRAD. OT Short Term Goal 2 (Week 1): Pt will be able to bathe LB with min A. OT Short Term Goal 3 (Week 1): Pt will be able to dress LB with mod A. OT Short Term Goal 4 (Week 1): Pt will be able to maintain static stand for 1 minute with mod A to pull clothing over hips.  Skilled Therapeutic Interventions/Progress Updates:   Pt received in bed stating he felt more fatigued today and his L leg was hurting.  Removed ACE wrap and had pt massage his leg, rewrapped leg and donned compression sock. Pt stated his leg felt much better.   He needed frequent rest breaks today and can only tolerate a minute or less of activity at a time, but overall could sit to EOB and use slide board to wc with min A.   Completed grooming and bathing from w/c and then needed to use a urinal. As a precursor to sit to stand, had pt practice w/c push ups with forward lean. Pt able to hold each pushup about 10 seconds so helpers could doff pants, (pt used urinal), don briefs, don pants over pt's hips while he held the push up.  Then tried sit to partial stand at end of bed using bed rail. Pt pushed up with B arms then transferred R arm to bed rail with MOD A to support his balance, then MAX A to further rise to stand which he could only do for 2-3 seconds with MAX A.  Pt out of breath, so rested for a while.  2nd attempt with the goal of only going halfway, pushing up with B hands and then just R hand on rail. Pt did so with max A but had immediate chest pain.  Pain was temporary, pt rested and relaxed before completing slide board back to bed with min A.  Pt set up in bed with all  needs met. Pt feeling better and relaxed. Bed alarm set.   Therapy Documentation Precautions:  Precautions Precautions: Fall Precaution Comments: monitor HR, sats and BP Restrictions Weight Bearing Restrictions: Yes LLE Weight Bearing: Non weight bearing Other Position/Activity Restrictions: L AKA - in ace wrap.  no drainage seen through wrap   Vital Signs: Therapy Vitals Pulse Rate: 64 BP: (!) 111/49 Oxygen Therapy SpO2: 98 % O2 Device: Room Air Pain: Pain Assessment Pain Score: 3  Pain Type: Acute pain Pain Location: Leg Pain Orientation: Left Pain Descriptors / Indicators: Aching Pain Onset: Gradual Pain Intervention(s): (rewrapped)    Therapy/Group: Individual Therapy  [place] 08/21/2019, 12:19 PM

## 2019-08-21 NOTE — Progress Notes (Signed)
Pt stated he could place self on cpap when ready for bed. RT will continue to monitor as needed.

## 2019-08-22 ENCOUNTER — Inpatient Hospital Stay (HOSPITAL_COMMUNITY): Payer: Medicare Other | Admitting: Occupational Therapy

## 2019-08-22 LAB — CBC
HCT: 28.8 % — ABNORMAL LOW (ref 39.0–52.0)
Hemoglobin: 8.7 g/dL — ABNORMAL LOW (ref 13.0–17.0)
MCH: 26.1 pg (ref 26.0–34.0)
MCHC: 30.2 g/dL (ref 30.0–36.0)
MCV: 86.5 fL (ref 80.0–100.0)
Platelets: 321 10*3/uL (ref 150–400)
RBC: 3.33 MIL/uL — ABNORMAL LOW (ref 4.22–5.81)
RDW: 17.7 % — ABNORMAL HIGH (ref 11.5–15.5)
WBC: 9.9 10*3/uL (ref 4.0–10.5)
nRBC: 0 % (ref 0.0–0.2)

## 2019-08-22 LAB — HEPARIN LEVEL (UNFRACTIONATED): Heparin Unfractionated: 0.43 IU/mL (ref 0.30–0.70)

## 2019-08-22 LAB — PROTIME-INR
INR: 2.3 — ABNORMAL HIGH (ref 0.8–1.2)
Prothrombin Time: 24.8 seconds — ABNORMAL HIGH (ref 11.4–15.2)

## 2019-08-22 MED ORDER — WARFARIN SODIUM 7.5 MG PO TABS
12.5000 mg | ORAL_TABLET | Freq: Once | ORAL | Status: AC
  Administered 2019-08-22: 18:00:00 12.5 mg via ORAL
  Filled 2019-08-22: qty 1

## 2019-08-22 NOTE — Progress Notes (Signed)
ANTICOAGULATION CONSULT NOTE - Follow Up Consult  Pharmacy Consult for Heparin + warfarin Indication: Mechanical valve  Patient Measurements: Height: 5\' 11"  (180.3 cm) Weight: 222 lb 0.1 oz (100.7 kg) IBW/kg (Calculated) : 75.3  Vital Signs: Temp: 97.7 F (36.5 C) (09/19 0435) Temp Source: Oral (09/19 0435) BP: 129/60 (09/19 0435) Pulse Rate: 78 (09/19 0435)  Labs: Recent Labs    08/20/19 0949 08/20/19 2112 08/21/19 0540 08/22/19 0524  HGB 8.2*  --  8.4* 8.7*  HCT 26.7*  --  27.3* 28.8*  PLT 296  --  323 321  LABPROT 21.2*  --  22.7* 24.8*  INR 1.9*  --  2.0* 2.3*  HEPARINUNFRC 0.11* 0.37 0.48 0.43  CREATININE 1.66*  --   --   --     Estimated Creatinine Clearance: 50.1 mL/min (A) (by C-G formula based on SCr of 1.66 mg/dL (H)).  Assessment: 68 year old male on warfarin for mechanical AVR who received INR reversal (Vitamin K 5mg  IV) on 9/9 for an AKA. Patient is on IV Heparin per pharmacy dosing consult and Warfarin was resumed per pharmacy dosing on 9/12. PTA warfarin dose - 10 mg daily except 12.5mg  TTS, admit INR 2.7 (goal 2.5-3.5)  Heparin level this morning remains therapeutic at 0.43. INR is trending up nicely but remains subtherapeutic. Anticipate it will be over 2.5 tomorrow and heparin can be stopped. CBC is stable and no bleeding noted.   Goal of Therapy:  Heparin level 0.3-0.7 units/ml INR 2.5-3.5 Monitor platelets by anticoagulation protocol: Yes   Plan:  - Continue Heparin at 1800 units/hr (18 ml/hr) - Repeat Warfarin 12.5 mg x 1 at 1800 today - Daily Heparin level, CBC, INR, and monitor for s/sx bleeding - Bridging with IV heparin until INR >2.5  Salome Arnt, PharmD, BCPS Please see AMION for all pharmacy numbers 08/22/2019 10:09 AM

## 2019-08-22 NOTE — Progress Notes (Signed)
[place] PHYSICAL MEDICINE & REHABILITATION PROGRESS NOTE   Subjective/Complaints: Patient seen laying in bed this morning.  He states he slept well overnight.  He was seen by cards yesterday, notes reviewed.  He appears more calm and comfortable this AM.  ROS: Denies CP, SOB, N/V/D  Objective:   No results found. Recent Labs    08/21/19 0540 08/22/19 0524  WBC 10.6* 9.9  HGB 8.4* 8.7*  HCT 27.3* 28.8*  PLT 323 321   Recent Labs    08/20/19 0949  NA 135  K 4.7  CL 99  CO2 23  GLUCOSE 335*  BUN 50*  CREATININE 1.66*  CALCIUM 9.0    Intake/Output Summary (Last 24 hours) at 08/22/2019 1033 Last data filed at 08/22/2019 1019 Gross per 24 hour  Intake 1354.6 ml  Output 4050 ml  Net -2695.4 ml     Physical Exam: Vital Signs Blood pressure 129/60, pulse 78, temperature 97.7 F (36.5 C), temperature source Oral, resp. rate 19, height 5\' 11"  (1.803 m), weight 100.7 kg, SpO2 100 %. Constitutional: No distress . Vital signs reviewed. HENT: Normocephalic.  Atraumatic. Eyes: EOMI. No discharge. Cardiovascular: No JVD. Respiratory: Normal effort.  No stridor. GI: Non-distended. Skin: Left AKA with dressing C/D/I Psych: Normal mood.  Normal behavior. Musc: Left AKA with edema and tenderness Neurological: Alert Motor: Bilateral upper extremities: 5/5 proximal distal Right lower extremity: 4/5 proximal distal, unchanged Left lower extremity: 4+/5 (pain inhibition)   Assessment/Plan: 1. Functional deficits secondary to new L AKA due to ischemia/gangrene which require 3+ hours per day of interdisciplinary therapy in a comprehensive inpatient rehab setting.  Physiatrist is providing close team supervision and 24 hour management of active medical problems listed below.  Physiatrist and rehab team continue to assess barriers to discharge/monitor patient progress toward functional and medical goals  Care Tool:  Bathing    Body parts bathed by patient: Right arm, Left  arm, Chest, Abdomen, Front perineal area, Right upper leg, Left upper leg, Face, Right lower leg   Body parts bathed by helper: Buttocks Body parts n/a: Left lower leg   Bathing assist Assist Level: Minimal Assistance - Patient > 75%     Upper Body Dressing/Undressing Upper body dressing   What is the patient wearing?: Hospital gown only    Upper body assist Assist Level: Set up assist    Lower Body Dressing/Undressing Lower body dressing      What is the patient wearing?: Pants     Lower body assist Assist for lower body dressing: Contact Guard/Touching assist     Toileting Toileting    Toileting assist Assist for toileting: Maximal Assistance - Patient 25 - 49% Assistive Device Comment: bedpan   Transfers Chair/bed transfer  Transfers assist  Chair/bed transfer activity did not occur: Safety/medical concerns  Chair/bed transfer assist level: Moderate Assistance - Patient 50 - 74% Chair/bed transfer assistive device: Sliding board   Locomotion Ambulation   Ambulation assist   Ambulation activity did not occur: Safety/medical concerns(labial BP and HR sitting EOB.)          Walk 10 feet activity   Assist  Walk 10 feet activity did not occur: Safety/medical concerns(labial BP and HR sitting EOB.)        Walk 50 feet activity   Assist Walk 50 feet with 2 turns activity did not occur: Safety/medical concerns(labial BP and HR sitting EOB.)         Walk 150 feet activity   Assist Walk 150 feet  activity did not occur: Safety/medical concerns(labial BP and HR sitting EOB.)         Walk 10 feet on uneven surface  activity   Assist Walk 10 feet on uneven surfaces activity did not occur: Safety/medical concerns(labial BP and HR sitting EOB.)         Wheelchair     Assist Will patient use wheelchair at discharge?: Yes Type of Wheelchair: Manual Wheelchair activity did not occur: Safety/medical concerns(labial BP and HR sitting  EOB.)  Wheelchair assist level: Supervision/Verbal cueing Max wheelchair distance: 130    Wheelchair 50 feet with 2 turns activity    Assist    Wheelchair 50 feet with 2 turns activity did not occur: Safety/medical concerns(labial BP and HR sitting EOB.)   Assist Level: Supervision/Verbal cueing   Wheelchair 150 feet activity     Assist  Wheelchair 150 feet activity did not occur: Safety/medical concerns(labial BP and HR sitting EOB.)       Blood pressure 129/60, pulse 78, temperature 97.7 F (36.5 C), temperature source Oral, resp. rate 19, height 5\' 11"  (1.803 m), weight 100.7 kg, SpO2 100 %.  Medical Problem List and Plan: 1.Fxnl and mobility deficitssecondary to PAD causing left AKA  Continue CIR 2.Mechanical AVR/Antithrombotics: -DVT/anticoagulation:Pharmaceutical:Coumadin and heparin GGT INR> 2.5.  Unfractionated heparin within normal range on 9/19  INR 2.3 on 9/19 -antiplatelet therapy: N/A 3. Pain Management:  Scheduled low dose flexeril tid to help with spasms.  May need neurontin titrate upwarrds. Scheduled tramadol 50 mg TID and will use oxy only when needs it.  Appears relatively controlled with medication on 9/18 4. Mood:LCSW to follow for evaluation and support. -antipsychotic agents: N/A 5. Neuropsych: This patientiscapable of making decisions on hisown behalf. 6. Skin/Wound Care:Monitor wound daily. Monitor stump for signs and symptoms of infection 7. Fluids/Electrolytes/Nutrition:Monitor I/Os 8. T2DM: Monitor BS ac/hs. Continue Lantus 40 mg bid with Victoza  Resumed amaryl 2mg    9/15- increase Lantus to 44 units BID CBG (last 3)  Recent Labs    08/20/19 2121 08/21/19 0611 08/21/19 0742  GLUCAP 226* 78 73   Remains labile with inconsistent recordings over the last 24 hours 9. CAD/CAF: Monitor for presyncope/syncope as back on Entresto.   Appreciate cards recs, metoprolol  decreased  Dig level low on 9/18  Pulse pressure remains wide, but slightly improved on 9/19  Heart rate improving on 9/19 10 Chronic systolic CHF: Heart healthy diet and monitor for signs of overload. Check weight daily. On Entresto, Demadex, metoprolol and fenofibrate, Vascepa.    Filed Weights   08/19/19 0459 08/21/19 0226 08/22/19 0436  Weight: 101.2 kg 100.5 kg 100.7 kg   Stable on 9/19 11. Urinary retention:   PVRs elevated  Continue Flomax 12. Constipation:Start bowel program as this has been an issues since first surgery.  13.  Acute blood loss anemia  Transfused 1 unit PRBC on 9/16  Hemoglobin 8.7 on 9/19  Continue to monitor 13. Hyponatremia:   Sodium 135 on 9/17, labs ordered for Monday  Continue to monitor 14. OSA: CPAP when sleeping with 2 L bleed in.  15. Acute on chronic renal failure:   Creatinine 1.66 on 9/17, labs ordered for Monday  Encourage fluids  Continue to monitor 16.  Transaminitis  LFTs elevated on 9/17, labs ordered for Monday  Continue to monitor  LOS: 5 days A FACE TO FACE EVALUATION WAS PERFORMED  Velecia Ovitt Lorie Phenix 08/22/2019, 10:33 AM

## 2019-08-22 NOTE — Progress Notes (Signed)
Occupational Therapy Session Note  Patient Details  Name: Joseph Hoffman MRN: [id number] Date of Birth: 1951/07/07  Today's Date: 08/22/2019 OT Individual Time: 1100-1200 OT Individual Time Calculation (min): 60 min    Short Term Goals: Week 1:  OT Short Term Goal 1 (Week 1): Pt will be able to transfer to [facility] and or toilet with mod A using LRAD. OT Short Term Goal 2 (Week 1): Pt will be able to bathe LB with min A. OT Short Term Goal 3 (Week 1): Pt will be able to dress LB with mod A. OT Short Term Goal 4 (Week 1): Pt will be able to maintain static stand for 1 minute with mod A to pull clothing over hips.  Skilled Therapeutic Interventions/Progress Updates:    Treatment session with focus on functional transfers and dynamic sitting balance during self-care retraining.  Pt received supine in bed reporting pain in residual limb but agreeable to OOB therapy.  Engaged in LB bathing at bed level with rolling at supervision level.  Pt completed sidelying to sitting at EOB with supervision.  Slide board transfer mod assist (+2 for safety) with pt reporting mild dizziness that quickly passed.  Engaged in [place] bathing and grooming tasks in sitting at sink with setup.  Pt with overall improved mobility and activity tolerance this session.  Pt requested to return to bed, but agreeable to sitting upright in chair position to continue to address upright tolerance, as this may be contributing to his dizziness.  Therapy Documentation Precautions:  Precautions Precautions: Fall Precaution Comments: monitor HR, sats and BP Restrictions Weight Bearing Restrictions: Yes LLE Weight Bearing: Non weight bearing Other Position/Activity Restrictions: L AKA - in ace wrap.  no drainage seen through wrap Pain: Pt with c/o pain 9/10.  Premedicated.  Therapy/Group: Individual Therapy  Simonne Come 08/22/2019, 12:30 PM

## 2019-08-23 ENCOUNTER — Inpatient Hospital Stay (HOSPITAL_COMMUNITY): Payer: Medicare Other

## 2019-08-23 LAB — CBC
HCT: 27.5 % — ABNORMAL LOW (ref 39.0–52.0)
Hemoglobin: 8.6 g/dL — ABNORMAL LOW (ref 13.0–17.0)
MCH: 26.8 pg (ref 26.0–34.0)
MCHC: 31.3 g/dL (ref 30.0–36.0)
MCV: 85.7 fL (ref 80.0–100.0)
Platelets: 300 10*3/uL (ref 150–400)
RBC: 3.21 MIL/uL — ABNORMAL LOW (ref 4.22–5.81)
RDW: 17.7 % — ABNORMAL HIGH (ref 11.5–15.5)
WBC: 10 10*3/uL (ref 4.0–10.5)
nRBC: 0 % (ref 0.0–0.2)

## 2019-08-23 LAB — HEPARIN LEVEL (UNFRACTIONATED): Heparin Unfractionated: 0.44 IU/mL (ref 0.30–0.70)

## 2019-08-23 LAB — GLUCOSE, CAPILLARY
Glucose-Capillary: 100 mg/dL — ABNORMAL HIGH (ref 70–99)
Glucose-Capillary: 91 mg/dL (ref 70–99)
Glucose-Capillary: 135 mg/dL — ABNORMAL HIGH (ref 70–99)
Glucose-Capillary: 100 mg/dL — ABNORMAL HIGH (ref 70–99)
Glucose-Capillary: 116 mg/dL — ABNORMAL HIGH (ref 70–99)
Glucose-Capillary: 113 mg/dL — ABNORMAL HIGH (ref 70–99)
Glucose-Capillary: 140 mg/dL — ABNORMAL HIGH (ref 70–99)
Glucose-Capillary: 123 mg/dL — ABNORMAL HIGH (ref 70–99)
Glucose-Capillary: 143 mg/dL — ABNORMAL HIGH (ref 70–99)
Glucose-Capillary: 221 mg/dL — ABNORMAL HIGH (ref 70–99)

## 2019-08-23 LAB — PROTIME-INR
INR: 2.4 — ABNORMAL HIGH (ref 0.8–1.2)
Prothrombin Time: 26.1 seconds — ABNORMAL HIGH (ref 11.4–15.2)

## 2019-08-23 MED ORDER — WARFARIN SODIUM 7.5 MG PO TABS
12.5000 mg | ORAL_TABLET | Freq: Once | ORAL | Status: AC
  Administered 2019-08-23: 17:00:00 12.5 mg via ORAL
  Filled 2019-08-23: qty 1

## 2019-08-23 NOTE — Progress Notes (Signed)
Physical Therapy Session Note  Patient Details  Name: Joseph Hoffman MRN: [id number] Date of Birth: Apr 06, 1951  Today's Date: 08/23/2019 PT Individual Time: 0800-0900 PT Individual Time Calculation (min): 60 min   Short Term Goals: Week 1:  PT Short Term Goal 1 (Week 1): Patient will perform bed mobility with min A. PT Short Term Goal 2 (Week 1): Pt will perform bed<>chair transfer w/ mod assist +2 for safety. PT Short Term Goal 3 (Week 1): Pt will tolerate sitting OOB in between therapy sessions for 1 hour. PT Short Term Goal 4 (Week 1): Pt will self-propel w/c 48' w/ supervision PT Short Term Goal 5 (Week 1): Patient will initiate standing with mod A +2 using LRAD.  Skilled Therapeutic Interventions/Progress Updates:     Patient in bed with RN in room providing morning medications upon PT arrival. Patient alert and agreeable to PT session. Patient reported 10/10 L residual limb pain, located below the incision and described as feeling like it was "smashed by a hammer," at beginning of session, RN made aware and provided pain medicine. PT provided repositioning, rest breaks, and distraction as pain interventions throughout session. Dr. Posey Pronto MD rounded during session and pain management and stated pain was consistent with post amputation. Wound open to air and appears to be heeling well. PT and RN dressed wound with Kurlex and 2 4" ace wraps for compression/edema control. Provided education about phantom pain, edema management and wrapping throughout.   Therapeutic Activity: Bed Mobility: Patient performed supine to sit with min A and sit to supine with supervision for safety in a flat bed without use of bed rails. Provided verbal cues for bringing LEs off the bed then pushing through elbows to sit up. Transfers: Patient performed sit to/from stand x1 and stand pivot x1 using a RW with the bed slightly elevated with min-mod A. He ambulated 3 feet taking very small steps using a hop-to gait  pattern on the R foot during stand pivot transfer. Provided verbal cues for hand placement on RW, L on RW R on bed to push up, reaching back with his R hand for controlled sitting, and using UEs to lift his foot up when ambulating/turning to the w/c. While sitting in the w/c PT provided patient with set up for UB bathing in front of the mirror seated in the w/c. Patient performed UB dressing and bathing with supervision.  He requested to get back to bed at end of session and performed a level slide board transfer from the w/c to the bed with total A for board placement and min A for the transfer. Provided cues for head-hips relationship, board placement, hand placement, and w/c set up during transfer.   Wheelchair Mobility:  Patient propelled wheelchair 5 feet with B UEs and R LE with supervision in the room. Provided verbal cues for use of R LE to assist with steering in tight spaces.  Patient in bed at end of session with breaks locked, bed alarm set, and all needs within reach. Educated on energy conservation techniques and benefits of sitting up OOB. Patient agreeable to sitting OOB during the day tomorrow.   Vitals: BP sitting 140/63 HR 99; BP after standing 144/53 HR 102. HR variable throughout session, maintaining between 60s-90s throughout. Patient reported some dizziness after stand that resolved in sitting.    Therapy Documentation Precautions:  Precautions Precautions: Fall Precaution Comments: monitor HR, sats and BP Restrictions Weight Bearing Restrictions: Yes LLE Weight Bearing: Non weight bearing Other Position/Activity  Restrictions: L AKA - in ace wrap.  no drainage seen through wrap    Therapy/Group: Individual Therapy  Reva Pinkley L Tyisha Cressy PT, DPT  08/23/2019, 12:17 PM

## 2019-08-23 NOTE — Progress Notes (Signed)
Occupational Therapy Session Note  Patient Details  Name: Joseph Hoffman MRN: [id number] Date of Birth: [birth year]  Today's Date: 08/23/2019 OT Missed Time: 62 Minutes Missed Time Reason: Patient fatigue;Patient ill (comment)  Pt stating he is very ill- sick to his stomach and nauseas. Pt unable to participate in any therapy. 60 min missed.      Curtis Sites 08/23/2019, 7:27 AM

## 2019-08-23 NOTE — Progress Notes (Signed)
ANTICOAGULATION CONSULT NOTE - Follow Up Consult  Pharmacy Consult for Heparin + warfarin Indication: Mechanical valve  Patient Measurements: Height: 5\' 11"  (180.3 cm) Weight: 227 lb 15.3 oz (103.4 kg) IBW/kg (Calculated) : 75.3  Vital Signs: Temp: 98.1 F (36.7 C) (09/20 0423) BP: 138/39 (09/20 0427) Pulse Rate: 75 (09/20 0427)  Labs: Recent Labs    08/21/19 0540 08/22/19 0524 08/23/19 0626  HGB 8.4* 8.7* 8.6*  HCT 27.3* 28.8* 27.5*  PLT 323 321 300  LABPROT 22.7* 24.8* 26.1*  INR 2.0* 2.3* 2.4*  HEPARINUNFRC 0.48 0.43 0.44    Estimated Creatinine Clearance: 50.7 mL/min (A) (by C-G formula based on SCr of 1.66 mg/dL (H)).  Assessment: 68 year old male on warfarin for mechanical AVR who received INR reversal (Vitamin K 5mg  IV) on 9/9 for an AKA. Patient is on IV Heparin per pharmacy dosing consult and Warfarin was resumed per pharmacy dosing on 9/12. PTA warfarin dose - 10 mg daily except 12.5mg  TTS, admit INR 2.7 (goal 2.5-3.5)  Heparin level this morning remains therapeutic at 0.44. INR is trending up nicely but remains subtherapeutic. CBC is stable and no bleeding noted.   Goal of Therapy:  Heparin level 0.3-0.7 units/ml INR 2.5-3.5 Monitor platelets by anticoagulation protocol: Yes   Plan:  - Continue Heparin at 1800 units/hr (18 ml/hr) - Repeat Warfarin 12.5 mg x 1 at 1800 today - Daily Heparin level, CBC, INR, and monitor for s/sx bleeding - Bridging with IV heparin until INR >2.5  Salome Arnt, PharmD, BCPS Please see AMION for all pharmacy numbers 08/23/2019 10:27 AM

## 2019-08-23 NOTE — Progress Notes (Signed)
Patient claims he does not feel  good claims he is clammy; vital signs and cbg checked WINL.Patient claims he just want to sleep. Wants to be cath but patient has been voiding and volumes are below 350 after voiding. Complains of nausea. Nausea med given.

## 2019-08-23 NOTE — Progress Notes (Signed)
[place] PHYSICAL MEDICINE & REHABILITATION PROGRESS NOTE   Subjective/Complaints: Patient seen sitting up in bed this AM.  He states he slept well overnight. He has questions regarding pain in his stump, which he notes improves with pressure.  Discussed desensitization with patient.   ROS: Denies CP, SOB, N/V/D  Objective:   No results found. Recent Labs    08/22/19 0524 08/23/19 0626  WBC 9.9 10.0  HGB 8.7* 8.6*  HCT 28.8* 27.5*  PLT 321 300   No results for input(s): NA, K, CL, CO2, GLUCOSE, BUN, CREATININE, CALCIUM in the last 72 hours.  Intake/Output Summary (Last 24 hours) at 08/23/2019 1407 Last data filed at 08/23/2019 1349 Gross per 24 hour  Intake 1242 ml  Output 3700 ml  Net -2458 ml     Physical Exam: Vital Signs Blood pressure (!) 134/50, pulse 73, temperature 98.2 F (36.8 C), temperature source Oral, resp. rate 18, height 5\' 11"  (1.803 m), weight 103.4 kg, SpO2 98 %. Constitutional: No distress . Vital signs reviewed. HENT: Normocephalic.  Atraumatic. Eyes: EOMI. No discharge. Cardiovascular: No JVD. Respiratory: Normal effort.  No stridor. GI: Non-distended. Skin: Left AKA with dressing C/D/I Psych: Normal mood.  Normal behavior. Musc: Left AKA with edema and tenderness Neurological: Alert Motor: Bilateral upper extremities: 5/5 proximal distal Right lower extremity: 4/5 proximal distal, stable Left lower extremity: 4+/5 (pain inhibition), stable   Assessment/Plan: 1. Functional deficits secondary to new L AKA due to ischemia/gangrene which require 3+ hours per day of interdisciplinary therapy in a comprehensive inpatient rehab setting.  Physiatrist is providing close team supervision and 24 hour management of active medical problems listed below.  Physiatrist and rehab team continue to assess barriers to discharge/monitor patient progress toward functional and medical goals  Care Tool:  Bathing    Body parts bathed by patient: Right arm,  Left arm, Chest, Abdomen, Front perineal area, Right upper leg, Left upper leg, Face, Right lower leg   Body parts bathed by helper: Buttocks Body parts n/a: Left lower leg   Bathing assist Assist Level: Minimal Assistance - Patient > 75%     Upper Body Dressing/Undressing Upper body dressing   What is the patient wearing?: Hospital gown only    Upper body assist Assist Level: Set up assist    Lower Body Dressing/Undressing Lower body dressing      What is the patient wearing?: Pants     Lower body assist Assist for lower body dressing: Contact Guard/Touching assist     Toileting Toileting    Toileting assist Assist for toileting: Maximal Assistance - Patient 25 - 49% Assistive Device Comment: bedpan   Transfers Chair/bed transfer  Transfers assist  Chair/bed transfer activity did not occur: Safety/medical concerns  Chair/bed transfer assist level: Minimal Assistance - Patient > 75% Chair/bed transfer assistive device: Sliding board   Locomotion Ambulation   Ambulation assist   Ambulation activity did not occur: Safety/medical concerns(labial BP and HR sitting EOB.)  Assist level: Minimal Assistance - Patient > 75% Assistive device: Walker-rolling Max distance: 3'   Walk 10 feet activity   Assist  Walk 10 feet activity did not occur: Safety/medical concerns(labial BP and HR sitting EOB.)        Walk 50 feet activity   Assist Walk 50 feet with 2 turns activity did not occur: Safety/medical concerns(labial BP and HR sitting EOB.)         Walk 150 feet activity   Assist Walk 150 feet activity did not occur:  Safety/medical concerns(labial BP and HR sitting EOB.)         Walk 10 feet on uneven surface  activity   Assist Walk 10 feet on uneven surfaces activity did not occur: Safety/medical concerns(labial BP and HR sitting EOB.)         Wheelchair     Assist Will patient use wheelchair at discharge?: Yes Type of Wheelchair:  Manual Wheelchair activity did not occur: Safety/medical concerns(labial BP and HR sitting EOB.)  Wheelchair assist level: Supervision/Verbal cueing Max wheelchair distance: 5'    Wheelchair 50 feet with 2 turns activity    Assist    Wheelchair 50 feet with 2 turns activity did not occur: Safety/medical concerns(labial BP and HR sitting EOB.)   Assist Level: Supervision/Verbal cueing   Wheelchair 150 feet activity     Assist  Wheelchair 150 feet activity did not occur: Safety/medical concerns(labial BP and HR sitting EOB.)       Blood pressure (!) 134/50, pulse 73, temperature 98.2 F (36.8 C), temperature source Oral, resp. rate 18, height 5\' 11"  (1.803 m), weight 103.4 kg, SpO2 98 %.  Medical Problem List and Plan: 1.Fxnl and mobility deficitssecondary to PAD causing left AKA  Cont CIR 2.Mechanical AVR/Antithrombotics: -DVT/anticoagulation:Pharmaceutical:Coumadin and heparin GGT INR> 2.5.  Unfractionated heparin within normal range on 9/20  INR 2.4 on 9/20 -antiplatelet therapy: N/A 3. Pain Management:  Scheduled low dose flexeril tid to help with spasms.  May need neurontin titrate upwarrds. Scheduled tramadol 50 mg TID and will use oxy only when needs it.  Appears relatively controlled with medication on 9/20  Encouraged desensitization techniques 4. Mood:LCSW to follow for evaluation and support. -antipsychotic agents: N/A 5. Neuropsych: This patientiscapable of making decisions on hisown behalf. 6. Skin/Wound Care:Monitor wound daily. Monitor stump for signs and symptoms of infection  Drainage continues to improve, decrease dressing changes to daily 7. Fluids/Electrolytes/Nutrition:Monitor I/Os 8. T2DM: Monitor BS ac/hs. Continue Lantus 40 mg bid with Victoza  Resumed amaryl 2mg    9/15- increase Lantus to 44 units BID CBG (last 3)  Recent Labs    08/22/19 2124 08/23/19 0627  08/23/19 1134  GLUCAP 123* 143* 221*   Labile on 9/20, monitor for trend 9. CAD/CAF: Monitor for presyncope/syncope as back on Entresto.   Appreciate cards recs, metoprolol decreased  Dig level low on 9/18  Pulse pressure remains wide on 9/20  Heart rate improving on 9/20 10 Chronic systolic CHF: Heart healthy diet and monitor for signs of overload. Check weight daily. On Entresto, Demadex, metoprolol and fenofibrate, Vascepa.    Filed Weights   08/21/19 0226 08/22/19 0436 08/23/19 0427  Weight: 100.5 kg 100.7 kg 103.4 kg   ? Reliability on 9/20 11. Urinary retention:   PVRs elevated  Continue Flomax 12. Constipation:Start bowel program as this has been an issues since first surgery.  13.  Acute blood loss anemia  Transfused 1 unit PRBC on 9/16  Hemoglobin 8.6 on 9/20  Continue to monitor 13. Hyponatremia:   Sodium 135 on 9/17, labs ordered for tomorrow  Continue to monitor 14. OSA: CPAP when sleeping with 2 L bleed in.  15. Acute on chronic renal failure:   Creatinine 1.66 on 9/17, labs ordered for tomorrow  Encourage fluids  Continue to monitor 16.  Transaminitis  LFTs elevated on 9/17, labs ordered for tomorrow  Continue to monitor  LOS: 6 days A FACE TO FACE EVALUATION WAS PERFORMED  Ankit Lorie Phenix 08/23/2019, 2:07 PM

## 2019-08-24 ENCOUNTER — Inpatient Hospital Stay (HOSPITAL_COMMUNITY): Payer: Medicare Other

## 2019-08-24 ENCOUNTER — Inpatient Hospital Stay (HOSPITAL_COMMUNITY): Payer: Medicare Other | Admitting: Occupational Therapy

## 2019-08-24 ENCOUNTER — Encounter (HOSPITAL_COMMUNITY): Payer: Medicare Other | Admitting: Psychology

## 2019-08-24 DIAGNOSIS — F411 Generalized anxiety disorder: Secondary | ICD-10-CM

## 2019-08-24 LAB — BASIC METABOLIC PANEL
Anion gap: 11 (ref 5–15)
BUN: 48 mg/dL — ABNORMAL HIGH (ref 8–23)
CO2: 27 mmol/L (ref 22–32)
Calcium: 9.5 mg/dL (ref 8.9–10.3)
Chloride: 101 mmol/L (ref 98–111)
Creatinine, Ser: 1.36 mg/dL — ABNORMAL HIGH (ref 0.61–1.24)
GFR calc Af Amer: >60 mL/min (ref >60)
GFR calc non Af Amer: 52 mL/min — ABNORMAL LOW (ref >60)
Glucose, Bld: 114 mg/dL — ABNORMAL HIGH (ref 70–99)
Potassium: 4.2 mmol/L (ref 3.5–5.1)
Sodium: 139 mmol/L (ref 135–145)

## 2019-08-24 LAB — PROTIME-INR
INR: 2.6 — ABNORMAL HIGH (ref 0.8–1.2)
Prothrombin Time: 27.1 seconds — ABNORMAL HIGH (ref 11.4–15.2)

## 2019-08-24 LAB — CBC
HCT: 27.8 % — ABNORMAL LOW (ref 39.0–52.0)
Hemoglobin: 8.7 g/dL — ABNORMAL LOW (ref 13.0–17.0)
MCH: 26.8 pg (ref 26.0–34.0)
MCHC: 31.3 g/dL (ref 30.0–36.0)
MCV: 85.5 fL (ref 80.0–100.0)
Platelets: 369 10*3/uL (ref 150–400)
RBC: 3.25 MIL/uL — ABNORMAL LOW (ref 4.22–5.81)
RDW: 17.6 % — ABNORMAL HIGH (ref 11.5–15.5)
WBC: 10.1 10*3/uL (ref 4.0–10.5)
nRBC: 0 % (ref 0.0–0.2)

## 2019-08-24 LAB — HEPARIN LEVEL (UNFRACTIONATED): Heparin Unfractionated: 0.57 IU/mL (ref 0.30–0.70)

## 2019-08-24 LAB — GLUCOSE, CAPILLARY
Glucose-Capillary: 163 mg/dL — ABNORMAL HIGH (ref 70–99)
Glucose-Capillary: 116 mg/dL — ABNORMAL HIGH (ref 70–99)
Glucose-Capillary: 151 mg/dL — ABNORMAL HIGH (ref 70–99)
Glucose-Capillary: 111 mg/dL — ABNORMAL HIGH (ref 70–99)
Glucose-Capillary: 221 mg/dL — ABNORMAL HIGH (ref 70–99)

## 2019-08-24 MED ORDER — CLONAZEPAM 0.25 MG PO TBDP: 0.2500 mg | ORAL_TABLET | Freq: Two times a day (BID) | ORAL | Status: DC | PRN

## 2019-08-24 MED ORDER — METOPROLOL SUCCINATE ER 25 MG PO TB24
12.5000 mg | ORAL_TABLET | Freq: Every day | ORAL | Status: DC
  Administered 2019-08-25 – 2019-09-15 (×21): 12.5 mg via ORAL
  Filled 2019-08-24 (×22): qty 1

## 2019-08-24 MED ORDER — CLONAZEPAM 0.5 MG PO TABS
0.2500 mg | ORAL_TABLET | Freq: Two times a day (BID) | ORAL | Status: DC | PRN
  Administered 2019-08-24: 0.25 mg via ORAL
  Filled 2019-08-24 (×2): qty 1

## 2019-08-24 MED ORDER — GABAPENTIN 300 MG PO CAPS: 300.0000 mg | ORAL_CAPSULE | Freq: Two times a day (BID) | ORAL | Status: DC

## 2019-08-24 MED ORDER — WARFARIN SODIUM 7.5 MG PO TABS
12.5000 mg | ORAL_TABLET | Freq: Once | ORAL | Status: AC
  Administered 2019-08-24: 12.5 mg via ORAL
  Filled 2019-08-24: qty 1

## 2019-08-24 MED ORDER — GLUCOSE 40 % PO GEL
ORAL | Status: AC
  Administered 2019-08-24: 37.5 g
  Filled 2019-08-24: qty 1

## 2019-08-24 MED ORDER — GLUCOSE 40 % PO GEL
ORAL | Status: AC
  Administered 2019-08-24: 22:00:00 37.5 g
  Filled 2019-08-24: qty 1

## 2019-08-24 MED ORDER — DIGOXIN 125 MCG PO TABS
0.0625 mg | ORAL_TABLET | Freq: Every day | ORAL | Status: DC
  Administered 2019-08-25 – 2019-08-27 (×3): 0.0625 mg via ORAL
  Filled 2019-08-24 (×3): qty 1

## 2019-08-24 NOTE — Progress Notes (Signed)
Occupational Therapy Session Note  Patient Details  Name: NYHEEM MADURO MRN: [id number] Date of Birth: 12-19-1950  Today's Date: 08/24/2019 OT Individual Time: [id number] OT Individual Time Calculation (min): 75 min    Short Term Goals: Week 1:  OT Short Term Goal 1 (Week 1): Pt will be able to transfer to [facility] and or toilet with mod A using LRAD. OT Short Term Goal 2 (Week 1): Pt will be able to bathe LB with min A. OT Short Term Goal 3 (Week 1): Pt will be able to dress LB with mod A. OT Short Term Goal 4 (Week 1): Pt will be able to maintain static stand for 1 minute with mod A to pull clothing over hips.  Skilled Therapeutic Interventions/Progress Updates:    patient in bed, alert, aware of needs, talkative.  Supine to SSP at edge of bed with min A.  C/o visual changes upon sitting - BP 153/127 , vision improved within a few minutes - cardiac MD in room shortly after and aware of symptoms with change of position.  Good seated balance.  Bathing and LB dressing completed edge of bed with increased time, and with min a for bathing to reach buttocks,   CS for CM of underwear with lateral leand and CGA for CM of shorts in stance with RW.  Sit to stand from elevated bed with RW CG/min A x2.  SPT bed to w/c min a for steadying and walker management.  Grooming independent w/c level.  Patient remained in the w/c at close of session with call bell in reach and tray table.    Therapy Documentation Precautions:  Precautions Precautions: Fall Precaution Comments: monitor HR, sats and BP Restrictions Weight Bearing Restrictions: Yes LLE Weight Bearing: Non weight bearing Other Position/Activity Restrictions: L AKA - in ace wrap.  no drainage seen through wrap General:   Vital Signs: Oxygen Therapy SpO2: 98 % O2 Device: Room Air Pain: Pain Assessment Pain Scale: 0-10 Pain Score: 5  Pain Location: Leg Pain Orientation: Left Pain Descriptors / Indicators: Tender;Discomfort Pain  Intervention(s): Repositioned   Therapy/Group: Individual Therapy  Carlos Levering 08/24/2019, 12:06 PM

## 2019-08-24 NOTE — Progress Notes (Signed)
ANTICOAGULATION CONSULT NOTE - Follow Up Consult  Pharmacy Consult for Heparin + warfarin Indication: Mechanical valve  Patient Measurements: Height: 5\' 11"  (180.3 cm) Weight: 227 lb 15.3 oz (103.4 kg) IBW/kg (Calculated) : 75.3  Vital Signs: Temp: 98.3 F (36.8 C) (09/21 0445) Temp Source: Oral (09/21 0445) BP: 112/52 (09/21 0445) Pulse Rate: 61 (09/21 0445)  Labs: Recent Labs    08/22/19 0524 08/23/19 0626 08/24/19 0724  HGB 8.7* 8.6* 8.7*  HCT 28.8* 27.5* 27.8*  PLT 321 300 369  LABPROT 24.8* 26.1* 27.1*  INR 2.3* 2.4* 2.6*  HEPARINUNFRC 0.43 0.44 0.57  CREATININE  --   --  1.36*    Estimated Creatinine Clearance: 61.8 mL/min (A) (by C-G formula based on SCr of 1.36 mg/dL (H)).  Assessment: 68 year old male on warfarin for mechanical AVR who received INR reversal (Vitamin K 5mg  IV) on 9/9 for an AKA. Patient is on IV Heparin per pharmacy dosing consult and Warfarin was resumed per pharmacy dosing on 9/12. PTA warfarin dose - 10 mg daily except 12.5mg  TTS, admit INR 2.7 (goal 2.5-3.5)  Heparin level this morning remains therapeutic. INR is trending up nicely, nowtherapeutic. CBC is stable, and no bleeding issues documented.   Goal of Therapy:  Heparin level 0.3-0.7 units/ml INR 2.5-3.5 Monitor platelets by anticoagulation protocol: Yes   Plan:  - D/c heparin as INR therapeutic - Repeat Warfarin 12.5 mg x 1 at 1800 - Monitor daily INR, CBC, s/sx bleeding - 1 more boosted dose today and hopefully can resume home warfarin dose tomorrow  [name], PharmD, BCPS Please check AMION for all [facility] contact numbers Clinical Pharmacist 08/24/2019 10:09 AM

## 2019-08-24 NOTE — Progress Notes (Signed)
Patient continues to have tremors occasionally transitioning to myoclonus question SE of gabapentin compounded by anxiety. Will d/c gabapentin and add klonopin prn.  In room to evaluate patient due to reports of bradycardia--nurse reported HR 38 with patient being alert and symptomatic. On exam, heart is IRIR with 50 bpm/apical  for a minute with + click.  EKG ordered and nonspecific AVB--will contact cards for input. Digoxin decreased today but question need for further changes. Cardiology contacted to evaluate EKG and for input.  Discussed with Dr. Oval Linsey who recommends monitoring as patient asymptomatic. They will continue to follow and adjust metoprolol/digoxin as needed.   L-AKA dressing changed--is dry with minimal serous drainage. Incision is healing well with decrease in edema.

## 2019-08-24 NOTE — Progress Notes (Signed)
[facility] PHYSICAL MEDICINE & REHABILITATION PROGRESS NOTE   Subjective/Complaints:   Pt reports feels better today- had upset stomach yesterday. AKA still painful- waiting for pain meds.  BP was low yesterday- he thinks that also made him feel bad- Cards just reduced his Metoprolol and Digoxin in last 24 hours, so hopefully will feel better.  Still unable to void regularly- just once in awhile.   ROS: Denies CP, SOB, N/V/D  Objective:   No results found. Recent Labs    08/23/19 0626 08/24/19 0724  WBC 10.0 10.1  HGB 8.6* 8.7*  HCT 27.5* 27.8*  PLT 300 369   Recent Labs    08/24/19 0724  NA 139  K 4.2  CL 101  CO2 27  GLUCOSE 114*  BUN 48*  CREATININE 1.36*  CALCIUM 9.5    Intake/Output Summary (Last 24 hours) at 08/24/2019 1114 Last data filed at 08/24/2019 0647 Gross per 24 hour  Intake 1006.39 ml  Output 1775 ml  Net -768.61 ml     Physical Exam: Vital Signs Blood pressure (!) 112/52, pulse 61, temperature 98.3 F (36.8 C), temperature source Oral, resp. rate 19, height 5\' 11"  (1.803 m), weight 103.4 kg, SpO2 98 %. Constitutional: No distress . Vital signs and labs and progress notes reviewed. Pt sitting up in bed, holding his L AKA HENT: Normocephalic.  Atraumatic. Eyes: EOMI. No discharge. Cardiovascular: No JVD. Respiratory: Normal effort.  No stridor. GI: Non-distended. Skin: Left AKA with dressing C/D/I Psych: Normal mood.  Normal behavior. Musc: Left AKA with edema and tenderness Neurological: Alert Motor: Bilateral upper extremities: 5/5 proximal distal Right lower extremity: 4/5 proximal distal, stable Left lower extremity: 4+/5 (pain inhibition), stable   Assessment/Plan: 1. Functional deficits secondary to new L AKA due to ischemia/gangrene which require 3+ hours per day of interdisciplinary therapy in a comprehensive inpatient rehab setting.  Physiatrist is providing close team supervision and 24 hour management of active medical  problems listed below.  Physiatrist and rehab team continue to assess barriers to discharge/monitor patient progress toward functional and medical goals  Care Tool:  Bathing    Body parts bathed by patient: Right arm, Left arm, Chest, Abdomen, Front perineal area, Right upper leg, Left upper leg, Face, Right lower leg   Body parts bathed by helper: Buttocks Body parts n/a: Left lower leg   Bathing assist Assist Level: Minimal Assistance - Patient > 75%     Upper Body Dressing/Undressing Upper body dressing   What is the patient wearing?: Hospital gown only    Upper body assist Assist Level: Set up assist    Lower Body Dressing/Undressing Lower body dressing      What is the patient wearing?: Pants     Lower body assist Assist for lower body dressing: Contact Guard/Touching assist     Toileting Toileting    Toileting assist Assist for toileting: Maximal Assistance - Patient 25 - 49% Assistive Device Comment: bedpan   Transfers Chair/bed transfer  Transfers assist  Chair/bed transfer activity did not occur: Safety/medical concerns  Chair/bed transfer assist level: Minimal Assistance - Patient > 75% Chair/bed transfer assistive device: Sliding board   Locomotion Ambulation   Ambulation assist   Ambulation activity did not occur: Safety/medical concerns(labial BP and HR sitting EOB.)  Assist level: Minimal Assistance - Patient > 75% Assistive device: Walker-rolling Max distance: 3'   Walk 10 feet activity   Assist  Walk 10 feet activity did not occur: Safety/medical concerns(labial BP and HR sitting EOB.)  Walk 50 feet activity   Assist Walk 50 feet with 2 turns activity did not occur: Safety/medical concerns(labial BP and HR sitting EOB.)         Walk 150 feet activity   Assist Walk 150 feet activity did not occur: Safety/medical concerns(labial BP and HR sitting EOB.)         Walk 10 feet on uneven surface   activity   Assist Walk 10 feet on uneven surfaces activity did not occur: Safety/medical concerns(labial BP and HR sitting EOB.)         Wheelchair     Assist Will patient use wheelchair at discharge?: Yes Type of Wheelchair: Manual Wheelchair activity did not occur: Safety/medical concerns(labial BP and HR sitting EOB.)  Wheelchair assist level: Supervision/Verbal cueing Max wheelchair distance: 5'    Wheelchair 50 feet with 2 turns activity    Assist    Wheelchair 50 feet with 2 turns activity did not occur: Safety/medical concerns(labial BP and HR sitting EOB.)   Assist Level: Supervision/Verbal cueing   Wheelchair 150 feet activity     Assist  Wheelchair 150 feet activity did not occur: Safety/medical concerns(labial BP and HR sitting EOB.)       Blood pressure (!) 112/52, pulse 61, temperature 98.3 F (36.8 C), temperature source Oral, resp. rate 19, height 5\' 11"  (1.803 m), weight 103.4 kg, SpO2 98 %.  Medical Problem List and Plan: 1.Fxnl and mobility deficitssecondary to PAD causing left AKA  Cont CIR 2.Mechanical AVR/Antithrombotics: -DVT/anticoagulation:Pharmaceutical:Coumadin and heparin GGT INR> 2.5.  Unfractionated heparin within normal range on 9/20   INR 2.4 on 9/20   9/21- INR 2.6- pharmacy stopped Heparin gtt- will con't coumadin mgmt  -antiplatelet therapy: N/A 3. Pain Management:  Scheduled low dose flexeril tid to help with spasms.  May need neurontin titrate upwarrds. Scheduled tramadol 50 mg TID and will use oxy only when needs it.  Appears relatively controlled with medication on 9/20  Encouraged desensitization techniques 4. Mood:LCSW to follow for evaluation and support. -antipsychotic agents: N/A 5. Neuropsych: This patientiscapable of making decisions on hisown behalf. 6. Skin/Wound Care:Monitor wound daily. Monitor stump for signs and symptoms of  infection  Drainage continues to improve, decrease dressing changes to daily 7. Fluids/Electrolytes/Nutrition:Monitor I/Os 8. T2DM: Monitor BS ac/hs. Continue Lantus 40 mg bid with Victoza  Resumed amaryl 2mg    9/15- increase Lantus to 44 units BID CBG (last 3)  Recent Labs    08/23/19 1637 08/23/19 2140 08/24/19 0647  GLUCAP 116* 151* 111*   Labile on 9/20, monitor for trend  9/21- BGs much better controlled 9. CAD/CAF: Monitor for presyncope/syncope as back on Entresto.   Appreciate cards recs, metoprolol decreased  Dig level low on 9/18  Pulse pressure remains wide on 9/20  Heart rate improving on 9/20  9/21- Cards- decr Metoprolol and Digoxin- pt feeling much better  10 Chronic systolic CHF: Heart healthy diet and monitor for signs of overload. Check weight daily. On Entresto, Demadex, metoprolol and fenofibrate, Vascepa.    Filed Weights   08/21/19 0226 08/22/19 0436 08/23/19 0427  Weight: 100.5 kg 100.7 kg 103.4 kg   ? Reliability on 9/20 11. Urinary retention:   PVRs elevated  Continue Flomax 12. Constipation:Start bowel program as this has been an issues since first surgery.  13.  Acute blood loss anemia  Transfused 1 unit PRBC on 9/16  Hemoglobin 8.6 on 9/20  Continue to monitor 13. Hyponatremia:   Sodium 135 on 9/17, labs ordered for tomorrow  Continue to monitor 14. OSA: CPAP when sleeping with 2 L bleed in.  15. Acute on chronic renal failure:   Creatinine 1.66 on 9/17, labs ordered for tomorrow  Encourage fluids  9/21- Cr down to 1.4  Continue to monitor 16.  Transaminitis  LFTs elevated on 9/17, labs ordered for tomorrow  9/21- BMP was ordered- will recheck CMP Thursday  Continue to monitor  LOS: 7 days A FACE TO FACE EVALUATION WAS PERFORMED  Sheli Dorin 08/24/2019, 11:14 AM

## 2019-08-24 NOTE — Plan of Care (Signed)
  Problem: Consults Goal: RH LIMB LOSS PATIENT EDUCATION Description: Description: See Patient Education module for eduction specifics. Outcome: Progressing Goal: Skin Care Protocol Initiated - if Braden Score 18 or less Description: If consults are not indicated, leave blank or document N/A Outcome: Progressing   Problem: RH BOWEL ELIMINATION Goal: RH STG MANAGE BOWEL WITH ASSISTANCE Description: STG Manage Bowel with Assistance. Outcome: Progressing Goal: RH STG MANAGE BOWEL W/MEDICATION W/ASSISTANCE Description: STG Manage Bowel with Medication with Assistance. Outcome: Progressing   Problem: RH BLADDER ELIMINATION Goal: RH STG MANAGE BLADDER WITH ASSISTANCE Description: STG Manage Bladder With Assistance Outcome: Progressing Goal: RH STG MANAGE BLADDER WITH MEDICATION WITH ASSISTANCE Description: STG Manage Bladder With Medication With Assistance. Outcome: Progressing   Problem: RH SKIN INTEGRITY Goal: RH STG SKIN FREE OF INFECTION/BREAKDOWN Outcome: Progressing Goal: RH STG MAINTAIN SKIN INTEGRITY WITH ASSISTANCE Description: STG Maintain Skin Integrity With Assistance. Outcome: Progressing Goal: RH STG ABLE TO PERFORM INCISION/WOUND CARE W/ASSISTANCE Description: STG Able To Perform Incision/Wound Care With Assistance. Outcome: Progressing   Problem: RH SAFETY Goal: RH STG ADHERE TO SAFETY PRECAUTIONS W/ASSISTANCE/DEVICE Description: STG Adhere to Safety Precautions With Assistance/Device. Outcome: Progressing   Problem: RH PAIN MANAGEMENT Goal: RH STG PAIN MANAGED AT OR BELOW PT'S PAIN GOAL Outcome: Progressing   Problem: RH KNOWLEDGE DEFICIT LIMB LOSS Goal: RH STG INCREASE KNOWLEDGE OF SELF CARE AFTER LIMB LOSS Outcome: Progressing

## 2019-08-24 NOTE — Progress Notes (Signed)
Physical Therapy Session Note  Patient Details  Name: Joseph Hoffman MRN: [id number] Date of Birth: 07/17/1951  Today's Date: 08/24/2019 PT Individual Time: 1100-1200 PT Individual Time Calculation (min): 60 min   Short Term Goals: Week 1:  PT Short Term Goal 1 (Week 1): Patient will perform bed mobility with min A. PT Short Term Goal 2 (Week 1): Pt will perform bed<>chair transfer w/ mod assist +2 for safety. PT Short Term Goal 3 (Week 1): Pt will tolerate sitting OOB in between therapy sessions for 1 hour. PT Short Term Goal 4 (Week 1): Pt will self-propel w/c 8' w/ supervision PT Short Term Goal 5 (Week 1): Patient will initiate standing with mod A +2 using LRAD.  Skilled Therapeutic Interventions/Progress Updates:     Patient in bed upon PT arrival. Patient alert and agreeable to PT session, reported blurred vision and seeing "in 3D" earlier with mobility and feeling mildly lightheaded in the bed at beginning of session. Vitals in supine: BP 114/41, manual HR 42 and irregular, SPO2 100%, [name], PA, made aware. Limited session to bed level exercise and sitting EOB per PA recommendations. Monitored HR with activity.  Patient reported 5/10 L residual limb pain during session, and he reported that he was pre-medicated prior to session. PT provided repositioning, rest breaks, and distraction as pain interventions throughout session.     Therapeutic Exercise: Patient performed the following exercises with verbal and tactile cues for proper technique. B SLR 2x10 B hip abduction 2x10 L isometric hip extension 2x10 Manual HR 45 after and continued to be irregular  Therapeutic Activity: Bed Mobility: Patient requested to sit up on the EOB to drink some water stating I really want to get moving, and he began to sit up on his own. He performed supine to/from sit with mod-max A for trunk management. Sitting EOB patient leaned to the R and unable to correct with max A, reported feeling more light  headed in sitting and returned to supine immediately. Vitals after sitting: BP 117/40, manual HR 38 and irregular, SPO2 100%.   Noted intermittent jerking movements in all 4 extremities and intermittent tremors in his R hand, RN called in and made aware and performed neuro assessment and checked vitals. Patient was alert and verbose throughout all of session.   Patient in bed with RN in room at end of session with breaks locked, bed alarm set, and all needs within reach.    Therapy Documentation Precautions:  Precautions Precautions: Fall Precaution Comments: monitor HR, sats and BP Restrictions Weight Bearing Restrictions: Yes LLE Weight Bearing: Non weight bearing Other Position/Activity Restrictions: L AKA - in ace wrap.  no drainage seen through wrap    Therapy/Group: Individual Therapy  Trannie Bardales L Arwyn Besaw PT, DPT  08/24/2019, 3:49 PM

## 2019-08-24 NOTE — Progress Notes (Signed)
RT offered pt CPAP dream station for the night and pt declined stating he did not want to use it tonight. RT will continue to monitor.

## 2019-08-24 NOTE — Progress Notes (Signed)
Physical Therapy Session Note  Patient Details  Name: Joseph Hoffman MRN: [id number] Date of Birth: [birth year]  Today's Date: 08/24/2019 PT Individual Time: [id number] PT Individual Time Calculation (min): 57 min   Short Term Goals: Week 1:  PT Short Term Goal 1 (Week 1): Patient will perform bed mobility with min A. PT Short Term Goal 2 (Week 1): Pt will perform bed<>chair transfer w/ mod assist +2 for safety. PT Short Term Goal 3 (Week 1): Pt will tolerate sitting OOB in between therapy sessions for 1 hour. PT Short Term Goal 4 (Week 1): Pt will self-propel w/c 36' w/ supervision PT Short Term Goal 5 (Week 1): Patient will initiate standing with mod A +2 using LRAD.      Skilled Therapeutic Interventions/Progress Updates:  [name], LPN stated that pt's cardiac meds are being adjusted, and he has A.fib.  Pt in bed, resting; he denied pain.   Mark, NT taking vitals; BP 110/36; HR 56 in supine.  Pt very motivated to sit up and participate in PT; PT agreed to attempt it.  Supine> sit with CGA. He was immediately dizzy and lay back down with poor control, mod assist.  Therapeutic exercises performed with LE to increase strength for functional mobility: supine- 15 x 1 each- R straight leg raises, bil bridging with towel roll under L thigh, L glut and quad set with towel roll under distal residual limb, R/L scapular protraction;  10 x 1  Each- bil hip adduction with 5 second hold, bil hip internal rotation.  2 x 10 cervical flexion.  In R sidelying- 2 x 10 active assistive L hip abduction.     Pt stated that R side lying was very comfortable.  PT encouraged him to try to sleep in this position.  At end of session, pt resting in R side lying, pillow between thighs and alarm set.  Call bell at hand.     Therapy Documentation Precautions:  Precautions Precautions: Fall Precaution Comments: monitor HR, sats and BP Restrictions Weight Bearing Restrictions: Yes LLE Weight Bearing: Non weight  bearing Other Position/Activity Restrictions: L AKA - in ace wrap.  no drainage seen through wrap  Therapy Vitals Temp: 98 F (36.7 C) Temp Source: Oral Pulse Rate: (!) 49 Resp: 16 BP: (!) 110/36 Patient Position (if appropriate): Lying Oxygen Therapy SpO2: 97 % O2 Device: Room Air          Therapy/Group: Individual Therapy  Abigaile Rossie 08/24/2019, 4:04 PM

## 2019-08-24 NOTE — Consult Note (Signed)
Neuropsychological Consultation   Patient:   Joseph Hoffman   DOB:   [birth year]  MR Number:  [id number]  Location:  [facility] [place] [id number] [place] [address] Dept: [place]: [phone]           Date of Service:   08/24/2019  Start Time:   8 AM End Time:   9 AM  Provider/Observer:  Ilean Skill, Psy.D.       Clinical Neuropsychologist       Billing Code/Service: 96158/[id number]  Chief Complaint:    Joseph Hoffman is a 68 year old male with a history of hypertension, type 2 diabetes, CAF, mechanical AVR, CAD pending CABG, systolic CHF, CKD, OSA with CPAP device and 2 L oxygen, with a left great toe amputation on 07/03/2019 and poor healing.  The patient was admitted on 8/26 with N/V, diarrhea and left foot infection.  He was started on IV antibiotics and underwent left below the knee amputation on 07/31/2019.  Hospital course was complicated by lethargy and confusion there were thought to be secondary to narcotics, hypotension and AKI.  Patient was evaluated and ultimately recommended for the comprehensive rehabilitation unit due to functional decline.  08/24/2019:  Patient had revision to AKA on 08/13/2019 due to signs of ischemia and edema at amputation site.  Patient continuing at times of anxiety and hard to wake after periods of naps/sleep especially in afternoon or evening.  Patient reports times of continued anxiety but that as he gets more familiar with rehab/hospital that the anxiety has improved.  Reason for Service:  The patient was referred for neuropsychological consultation due to coping and adjustment issues following his left below the knee amputation with later revision to AKA.  The patient also had periods of confusion and lethargy initially after the surgery but these appear to have cleared overall.  Below is the HPI for the current admission.  Joseph Hoffman is a 68  year old male with history ofT2DM, CAD, CAF, mechanical AVR- on coumadin,OSA-CPAP with 2 L oxygen at nights, left great toe amputation 07/03/2019 who was admitted with nausea vomiting, poor wound healing and purulent drainage.He was started on IV antibiotics and underwent left BKA on 07/31/2019 by Dr. Carlis Abbott. Hospital course complicated by hypotension with AKI, urinary retention, lethargy with bouts of confusion felt to be due to narcotics as well as subtherapeutic INR requiring heparin/Coumadin bridge. Therapy evaluations done revealing functional deficits and CIR was recommended for follow-up therapy. He was admitted for comprehensive inpatient rehab on 08/05/2019.   He was maintained on IV heparin untilINRgreater than 2.5. Pain control was reasonable with prn use ofoxycodone and Robaxin. Foley was discontinued past admission and he was started on bladder program. He continued to have problems with urinary retention requiring in and out caths at least 4-5 times a day and wasplaced on 1500 cc fluid restriction due tohighPVRs.Laxatives were added to help with OIC. He was noted to develop increasing pain with edema of amputation site on 09/09. Wound showed signs of ischemiaand Dr. Carlis Abbott was consulted for input. Surgical intervention recommended and patient was discharged to acute hospital for revision of wound to left AKA on zero 9/10 a.m. Postop pain control is improving.He continues to have serosanguineous drainage from amputation site and currently on IV heparin/Coumadin bridge as INR subtherapeutic. Follow-up CBC shows H&H to be relatively stable. Therapy was resumed and patient continues to have limitations in mobility and ADLs. He was  cleared to resume CIR program on 09/14.  Current Status:  The patient was orientated with good mental status today.  Reported that anxiety was improved but still more apprehensive than on first visit but it was also early in AM.  Patient still  positive but worried that wife is not asking for help and that wife is planning on going forward with her knee replacement during his recovery.   Behavioral Observation: Joseph Hoffman  presents as a 68 y.o.-year-old Right Caucasian Male who appeared his stated age. his dress was Appropriate and he was Well Groomed and his manners were Appropriate to the situation.  his participation was indicative of Appropriate and Attentive behaviors.  There were any physical disabilities noted.  he displayed an appropriate level of cooperation and motivation.     Interactions:    Active Appropriate and Attentive  Attention:   within normal limits and attention span and concentration were age appropriate  Memory:   within normal limits; recent and remote memory intact  Visuo-spatial:  not examined  Speech (Volume):  normal  Speech:   normal; normal  Thought Process:  Coherent and Relevant  Though Content:  WNL; not suicidal and not homicidal  Orientation:   person, place, time/date and situation  Judgment:   Good  Planning:   Good  Affect:    Anxious  Mood:    Anxious  Insight:   Good  Intelligence:   normal  Medical History:   Past Medical History:  Diagnosis Date  . Anxiety   . Arthritis   . Asthma   . CAD (coronary artery disease)   . Carotid artery disease ([place])    a. dopplers 06/2019 - Evidence consistent with a total occlusion of the left ICA, 1-39% RICA.  [name] Chronic combined systolic and diastolic CHF (congestive heart failure) ([place])   . CKD (chronic kidney disease), stage IV ([place])   . Diabetic foot ulcer ([place])   . DM (diabetes mellitus) ([place]) 09/30/2012  . Hepatitis B 2003  . Hx of AKA (above knee amputation), left ([place])   . Hyperlipidemia   . Hypertension   . Myocardial infarction ([place])   . Permanent atrial fibrillation, since 1994   . S/P AVR (aortic valve replacement), 09/30/2012   a. s/p mechanical AVR in 09/2012 (on Coumadin)  . Severe aortic insufficiency   .  Sleep apnea    uses CPAP        Psychiatric History:  The patient does have a prior history of anxiety and reports that while initially was quite anxious about his prognosis moving forward and how he would manage after his amputation the patient reports that the symptoms have improved and his confidence and comfort level is been growing.  The patient reports that he has had fears regarding potential infection due to cardiovascular issues which is 1 of the primary reasons why he went ahead and decided as quickly as he did to have the amputation to avoid any type of septic or infectious process that could complicate his cardiovascular status.  Family Med/Psych History:  Family History  Problem Relation Age of Onset  . Hypertension Mother   . Diabetes Father   . Heart attack Brother 66  . Hyperlipidemia Brother 68       stents placed    Impression/DX:  Joseph Hoffman is a 68 year old male with a history of hypertension, type 2 diabetes, CAF, mechanical AVR, CAD pending CABG, systolic CHF, CKD, OSA with CPAP device and 2 L oxygen,  with a left great toe amputation on 07/03/2019 and poor healing.  The patient was admitted on 8/26 with N/V, diarrhea and left foot infection.  He was started on IV antibiotics and underwent left below the knee amputation on 07/31/2019.  Hospital course was complicated by lethargy and confusion there were thought to be secondary to narcotics, hypotension and AKI.  Patient was evaluated and ultimately recommended for the comprehensive rehabilitation unit due to functional decline.  08/24/2019:  Patient had revision to AKA on 08/13/2019 due to signs of ischemia and edema at amputation site.  Patient continuing at times of anxiety and hard to wake after periods of naps/sleep especially in afternoon or evening.  Patient reports times of continued anxiety but that as he gets more familiar with rehab/hospital that the anxiety has improved.  The patient was orientated with good mental  status today.  Reported that anxiety was improved but still more apprehensive than on first visit but it was also early in AM.  Patient still positive but worried that wife is not asking for help and that wife is planning on going forward with her knee replacement during his recovery.   Disposition/Plan:  Patient should be placed on CPAP even if just taking a nap.  His difficulty with anxiety may improve and be quicker to engage in therapies.  Diagnosis:    Left AKA        Electronically Signed   _______________________ Ilean Skill, Psy.D.

## 2019-08-24 NOTE — Progress Notes (Signed)
Progress Note  Patient Name: Joseph Hoffman Date of Encounter: 08/24/2019  Primary Cardiologist: Shelva Majestic, MD  CT Surgeon: [name]  Subjective   Feeling much better. Notes improved mental clarity after reducing metoprolol.  He has dizziness and blurred vision when standing.  Working with PT/OT and able to stand with assistance and move to wheelchair.   Inpatient Medications    Scheduled Meds: . B-complex with vitamin C  1 tablet Oral Daily  . bethanechol  10 mg Oral QID  . cholecalciferol  1,000 Units Oral Q breakfast  . cyclobenzaprine  5 mg Oral TID  . digoxin  0.125 mg Oral Daily  . docusate sodium  100 mg Oral BID  . escitalopram  10 mg Oral QHS  . ezetimibe  10 mg Oral Daily  . feeding supplement (PRO-STAT SUGAR FREE 64)  30 mL Oral BID  . fenofibrate  160 mg Oral Q breakfast  . gabapentin  300 mg Oral TID  . glimepiride  2 mg Oral Q breakfast  . hydrocortisone cream   Topical TID  . Icosapent Ethyl  2 g Oral BID  . insulin aspart  0-20 Units Subcutaneous TID WC  . insulin aspart  0-5 Units Subcutaneous QHS  . insulin glargine  44 Units Subcutaneous BID  . liraglutide  1.8 mg Subcutaneous QHS  . metoprolol succinate  25 mg Oral Daily  . milk and molasses  1 enema Rectal Once  . mometasone-formoterol  2 puff Inhalation BID  . pantoprazole  40 mg Oral Daily  . polyethylene glycol  17 g Oral BID  . Ensure Max Protein  11 oz Oral Daily  . saccharomyces boulardii  250 mg Oral 3 times weekly  . sacubitril-valsartan  1 tablet Oral BID  . senna-docusate  2 tablet Oral BID  . tamsulosin  0.8 mg Oral QPC supper  . torsemide  20 mg Oral Daily  . torsemide  40 mg Oral Daily  . traMADol  50 mg Oral TID  . vitamin C  500 mg Oral BID  . Warfarin - Pharmacist Dosing Inpatient   Does not apply q1800   Continuous Infusions: . heparin 1,800 Units/hr (08/24/19 0647)   PRN Meds: acetaminophen, albuterol, alum & mag hydroxide-simeth, bisacodyl,  guaiFENesin-dextromethorphan, lidocaine, nitroGLYCERIN, oxyCODONE, polyethylene glycol, prochlorperazine **OR** prochlorperazine **OR** prochlorperazine, sodium chloride flush, sodium phosphate, traZODone   Vital Signs    Vitals:   08/23/19 2130 08/24/19 0444 08/24/19 0445 08/24/19 0913  BP:  (!) 111/44 (!) 112/52   Pulse: 62 63 61   Resp: 18 18 19    Temp:  98.3 F (36.8 C) 98.3 F (36.8 C)   TempSrc:   Oral   SpO2: 98% 100%  98%  Weight:      Height:        Intake/Output Summary (Last 24 hours) at 08/24/2019 0934 Last data filed at 08/24/2019 0647 Gross per 24 hour  Intake 1006.39 ml  Output 2050 ml  Net -1043.61 ml   Last 3 Weights 08/23/2019 08/22/2019 08/21/2019  Weight (lbs) 227 lb 15.3 oz 222 lb 0.1 oz 221 lb 9 oz  Weight (kg) 103.4 kg 100.7 kg 100.5 kg      Telemetry    n/a - Personally Reviewed  ECG    n/a - Personally Reviewed  Physical Exam   VS:  BP (!) 112/52 (BP Location: Right Arm)   Pulse 61   Temp 98.3 F (36.8 C) (Oral)   Resp 19   Ht 5'  11" (1.803 m)   Wt 103.4 kg   SpO2 98%   BMI 31.79 kg/m  , BMI Body mass index is 31.79 kg/m. GENERAL:  Chronically ill-appearing HEENT: Pupils equal round and reactive, fundi not visualized, oral mucosa unremarkable NECK:  No jugular venous distention, waveform within normal limits, carotid upstroke brisk and symmetric, no bruits LUNGS:  Clear to auscultation bilaterally HEART: Irregularly irregular.  Bradycardic.   PMI not displaced or sustained,S1 and S2 within normal limits, no S3, no S4, no clicks, no rubs, III/VI diastolic murmur at the LUSB ABD:  Flat, positive bowel sounds normal in frequency in pitch, no bruits, no rebound, no guarding, no midline pulsatile mass, no hepatomegaly, no splenomegaly EXT: L AKA.  no edema, no cyanosis no clubbing SKIN:  No rashes no nodules NEURO:  Cranial nerves II through XII grossly intact, motor grossly intact throughout PSYCH:  Cognitively intact, oriented to person  place and time   Labs    High Sensitivity Troponin:  No results for input(s): TROPONINIHS in the last 720 hours.    Chemistry Recent Labs  Lab 08/18/19 0333 08/20/19 0949 08/24/19 0724  NA 132* 135 139  K 4.7 4.7 4.2  CL 95* 99 101  CO2 26 23 27   GLUCOSE 373* 335* 114*  BUN 47* 50* 48*  CREATININE 1.72* 1.66* 1.36*  CALCIUM 8.8* 9.0 9.5  PROT 5.9* 6.3*  --   ALBUMIN 2.4* 2.5*  --   AST 38 59*  --   ALT 30 49*  --   ALKPHOS 41 40  --   BILITOT 0.6 0.8  --   GFRNONAA 39* 41* 52*  GFRAA 46* 48* >60  ANIONGAP 11 13 11      Hematology Recent Labs  Lab 08/22/19 0524 08/23/19 0626 08/24/19 0724  WBC 9.9 10.0 10.1  RBC 3.33* 3.21* 3.25*  HGB 8.7* 8.6* 8.7*  HCT 28.8* 27.5* 27.8*  MCV 86.5 85.7 85.5  MCH 26.1 26.8 26.8  MCHC 30.2 31.3 31.3  RDW 17.7* 17.7* 17.6*  PLT 321 300 369    BNPNo results for input(s): BNP, PROBNP in the last 168 hours.   DDimer No results for input(s): DDIMER in the last 168 hours.   Radiology    No results found.  Cardiac Studies   1. Cath 06/29/19 Conclusions: 1. Multivessel coronary artery disease, including 50% mid LAD, 80% large OM2, 50% mid RCA, and 80-90% ostial rPDA stenoses. 2. Mildly to moderately elevated left and right heart filling pressures. 3. Mildly reduced Fick cardiac output/index. Recommendations: 1. Gentle post-cath hydration for goal net even fluid balance today. Consider gentle diuresis, as renal function tolerates, beginning tomorrow. 2. Ongoing workup for redo aortic valve replacement +/- CABG per Drs. Claiborne Billings and Eckley. 3. Initiate heparin infusion 2 hours after TR band removal. 4. Aggressive secondary prevention.  TEE 05/29/19 IMPRESSIONS  1. The left ventricle has moderate-severely reduced systolic function, with an ejection fraction of 30-35%. Left ventricular diffuse hypokinesis. 2. The right ventricle has severely reduced systolic function. The cavity was mildly enlarged. 3. Left atrial size was  severely dilated. 4. Right atrial size was severely dilated. 5. The mitral valve is grossly normal. No evidence of mitral valve stenosis. 6. The tricuspid valve was grossly normal. 7. Aortic valve regurgitation is severe by color flow Doppler. 8. There is evidence of mild plaque in the descending aorta. 9. Moderate to severe global reduction in LV systolic function; mild RVE with severe RV dysfunction; severe biatrial enlargement; s/p mechanical AVR  with mean gradient of 14 mmHg (both leaflets appear to be mobile); severe perivalvular AI; mild MR and TR.  Patient Profile     68 y.o. male with permanent atrial fibrillation, chronic systolic and diastolic heart failure, mechanical AVR in 2013, severe perivalvular AR, hypertension, hyperlipidemia (intolerant of statins) OSA on CPAP, diabetes, CKD 4, and hepatitis  Assessment & Plan    # Chronic systolic and diastolic heart failure: # RV failure: Volume status stable.  Renal function continues to improve.  Continue torsemide.  We will reduce the dose of his metoprolol to shorten the period of diastases given his severe AR with low diastolic blood pressure.  We will also reduce digoxin to 0.0625 mg daily.  Continue Entresto.    # s/p mechanical AVR:  # Severe perivalvular AR:  Low diastolic blood pressure.  Metoprolol was reduced to reduce time in diastole and he is feeling better.  Heart rates are in the 50-70s.  DBP remains in the 30-50s with a wide pulse pressure.  Reduce metoprolol and digoxin as above.   # CAD:  Severe CAD on cath 06/2019.  CABG/AVR has been on hold due to non-healing wound requiring amputations.   # Diabetic foot ulcer:  S/p toe amputation 06/2019 followed by BKA 8/28 followed by AKA 08/2019.  Admitted to CIR.  # CKD: Renal function improving with diuresis.   # Intermittent confusion:  Likely attributable to low DBP and slow HR.  Reducing metoprolol and digoxin as above.   # Hyperlpidemia:  Statin intolerant.  Continue Zetia and fenofibrate.  Consider PCSK9 inhibitor as outpatient.   # Persistent atrial fibrillation:  Continue warfarin.  Reduce metoprolol and digoxin as above.       For questions or updates, please contact [place] Please consult [email or web address] for contact info under        Signed, Skeet Latch, MD  08/24/2019, 9:34 AM

## 2019-08-25 ENCOUNTER — Inpatient Hospital Stay (HOSPITAL_COMMUNITY): Payer: Medicare Other

## 2019-08-25 ENCOUNTER — Inpatient Hospital Stay (HOSPITAL_COMMUNITY): Payer: Medicare Other | Admitting: Occupational Therapy

## 2019-08-25 LAB — GLUCOSE, CAPILLARY
Glucose-Capillary: 100 mg/dL — ABNORMAL HIGH (ref 70–99)
Glucose-Capillary: 108 mg/dL — ABNORMAL HIGH (ref 70–99)
Glucose-Capillary: 138 mg/dL — ABNORMAL HIGH (ref 70–99)
Glucose-Capillary: 66 mg/dL — ABNORMAL LOW (ref 70–99)
Glucose-Capillary: 66 mg/dL — ABNORMAL LOW (ref 70–99)
Glucose-Capillary: 66 mg/dL — ABNORMAL LOW (ref 70–99)
Glucose-Capillary: 68 mg/dL — ABNORMAL LOW (ref 70–99)
Glucose-Capillary: 71 mg/dL (ref 70–99)
Glucose-Capillary: 73 mg/dL (ref 70–99)
Glucose-Capillary: 204 mg/dL — ABNORMAL HIGH (ref 70–99)

## 2019-08-25 LAB — CBC
HCT: 28 % — ABNORMAL LOW (ref 39.0–52.0)
Hemoglobin: 8.4 g/dL — ABNORMAL LOW (ref 13.0–17.0)
MCH: 25.8 pg — ABNORMAL LOW (ref 26.0–34.0)
MCHC: 30 g/dL (ref 30.0–36.0)
MCV: 86.2 fL (ref 80.0–100.0)
Platelets: 374 10*3/uL (ref 150–400)
RBC: 3.25 MIL/uL — ABNORMAL LOW (ref 4.22–5.81)
RDW: 17.7 % — ABNORMAL HIGH (ref 11.5–15.5)
WBC: 12 10*3/uL — ABNORMAL HIGH (ref 4.0–10.5)
nRBC: 0 % (ref 0.0–0.2)

## 2019-08-25 LAB — PROTIME-INR
INR: 2.9 — ABNORMAL HIGH (ref 0.8–1.2)
Prothrombin Time: 29.7 seconds — ABNORMAL HIGH (ref 11.4–15.2)

## 2019-08-25 MED ORDER — WARFARIN SODIUM 7.5 MG PO TABS
12.5000 mg | ORAL_TABLET | Freq: Once | ORAL | Status: AC
  Administered 2019-08-25: 12.5 mg via ORAL
  Filled 2019-08-25: qty 1

## 2019-08-25 NOTE — Progress Notes (Signed)
Occupational Therapy Session Note  Patient Details  Name: Joseph Hoffman MRN: [id number] Date of Birth: [birth year]  Today's Date: 08/25/2019 OT Individual Time: 1045-1200 OT Individual Time Calculation (min): 75 min    Short Term Goals: Week 1:  OT Short Term Goal 1 (Week 1): Pt will be able to transfer to [facility] and or toilet with mod A using LRAD. OT Short Term Goal 2 (Week 1): Pt will be able to bathe LB with min A. OT Short Term Goal 3 (Week 1): Pt will be able to dress LB with mod A. OT Short Term Goal 4 (Week 1): Pt will be able to maintain static stand for 1 minute with mod A to pull clothing over hips.  Skilled Therapeutic Interventions/Progress Updates:    Pt received in bed ready for therapy.  Sat to EOB with S and then used slide board to w.c with CGA to close S.   Pt taken to gym to work on sit to stand strength using the [place] as a A.  From sit to stand he needed Max A initially.to rise to stand.  Once standing he held his balance with BUE and CGA for 20 seconds.  He rested on pads of stedy. From stedy pads to stand, pt can rise with CGA.  Pt practiced standing 2 more times for 20 sec from rising from pads.  Stood another time rising from wc seat with mod A to rise and then stood 20 sec.  Some increases in pain in L limb.  Pt then worked on activity tolerance using 2 lb bar on arm exercises of rowing exercises and overhead tricep presses. Pt completed approx  10- 15 reps at a time going to is level of fatigue and challenge.   Pt taken back to room with all needs met. Belt alarm on.   Therapy Documentation Precautions:  Precautions Precautions: Fall Precaution Comments: monitor HR, sats and BP Restrictions Weight Bearing Restrictions: Yes LLE Weight Bearing: Non weight bearing Other Position/Activity Restrictions: L AKA - in ace wrap.  no drainage seen through wrap    Vital Signs: Oxygen Therapy SpO2: 97 % O2 Device: Room Air Pain:  no c/o pain    Therapy/Group:  Individual Therapy  [place] 08/25/2019, 11:49 AM

## 2019-08-25 NOTE — Plan of Care (Signed)
  Problem: Consults Goal: RH LIMB LOSS PATIENT EDUCATION Description: Description: See Patient Education module for eduction specifics. Outcome: Progressing Goal: Skin Care Protocol Initiated - if Braden Score 18 or less Description: If consults are not indicated, leave blank or document N/A Outcome: Progressing   Problem: RH BOWEL ELIMINATION Goal: RH STG MANAGE BOWEL WITH ASSISTANCE Description: STG Manage Bowel with Assistance. Outcome: Progressing Goal: RH STG MANAGE BOWEL W/MEDICATION W/ASSISTANCE Description: STG Manage Bowel with Medication with Assistance. Outcome: Progressing   Problem: RH BLADDER ELIMINATION Goal: RH STG MANAGE BLADDER WITH ASSISTANCE Description: STG Manage Bladder With Assistance Outcome: Progressing Goal: RH STG MANAGE BLADDER WITH MEDICATION WITH ASSISTANCE Description: STG Manage Bladder With Medication With Assistance. Outcome: Progressing   Problem: RH SKIN INTEGRITY Goal: RH STG SKIN FREE OF INFECTION/BREAKDOWN Outcome: Progressing Goal: RH STG MAINTAIN SKIN INTEGRITY WITH ASSISTANCE Description: STG Maintain Skin Integrity With Assistance. Outcome: Progressing Goal: RH STG ABLE TO PERFORM INCISION/WOUND CARE W/ASSISTANCE Description: STG Able To Perform Incision/Wound Care With Assistance. Outcome: Progressing   Problem: RH SAFETY Goal: RH STG ADHERE TO SAFETY PRECAUTIONS W/ASSISTANCE/DEVICE Description: STG Adhere to Safety Precautions With Assistance/Device. Outcome: Progressing   Problem: RH PAIN MANAGEMENT Goal: RH STG PAIN MANAGED AT OR BELOW PT'S PAIN GOAL Outcome: Progressing   Problem: RH KNOWLEDGE DEFICIT LIMB LOSS Goal: RH STG INCREASE KNOWLEDGE OF SELF CARE AFTER LIMB LOSS Outcome: Progressing

## 2019-08-25 NOTE — Progress Notes (Signed)
[facility] PHYSICAL MEDICINE & REHABILITATION PROGRESS NOTE   Subjective/Complaints:   Pt reports his brain is "firing better" is more able to participate in therapy. Was having shakes and spasms yesterday- didn't so far today.  Did GREAT with mirror therapy for phantom pain- working Media planner.  Has cotton mouth.   ROS: Denies CP, SOB, N/V/D  Objective:   No results found. Recent Labs    08/24/19 0724 08/25/19 0506  WBC 10.1 12.0*  HGB 8.7* 8.4*  HCT 27.8* 28.0*  PLT 369 374   Recent Labs    08/24/19 0724  NA 139  K 4.2  CL 101  CO2 27  GLUCOSE 114*  BUN 48*  CREATININE 1.36*  CALCIUM 9.5    Intake/Output Summary (Last 24 hours) at 08/25/2019 1539 Last data filed at 08/25/2019 1300 Gross per 24 hour  Intake 480 ml  Output 2900 ml  Net -2420 ml     Physical Exam: Vital Signs Blood pressure (!) 140/46, pulse 76, temperature 98.1 F (36.7 C), temperature source Oral, resp. rate 19, height 5\' 11"  (1.803 m), weight 112.2 kg, SpO2 100 %. Constitutional: No distress . Vital signs  and progress notes reviewed. Pt sitting up in manual w/c in hallway with therapy, NAD HENT: Normocephalic.  Atraumatic. Eyes: EOMI. No discharge. Cardiovascular: No JVD. Respiratory: Normal effort.  No stridor. GI: Non-distended. Skin: Left AKA with dressing C/D/I Psych: Normal mood.  Normal behavior. More with it, less confused/less vague Musc: Left AKA with edema and tenderness Neurological: Alert Motor: Bilateral upper extremities: 5/5 proximal distal Right lower extremity: 4/5 proximal distal, stable Left lower extremity: 4+/5 (pain inhibition), stable   Assessment/Plan: 1. Functional deficits secondary to new L AKA due to ischemia/gangrene which require 3+ hours per day of interdisciplinary therapy in a comprehensive inpatient rehab setting.  Physiatrist is providing close team supervision and 24 hour management of active medical problems listed below.  Physiatrist and  rehab team continue to assess barriers to discharge/monitor patient progress toward functional and medical goals  Care Tool:  Bathing    Body parts bathed by patient: Right arm, Left arm, Chest, Abdomen, Front perineal area, Right upper leg, Left upper leg, Face, Right lower leg, Buttocks   Body parts bathed by helper: Buttocks Body parts n/a: Left lower leg   Bathing assist Assist Level: Minimal Assistance - Patient > 75%     Upper Body Dressing/Undressing Upper body dressing   What is the patient wearing?: Pull over shirt    Upper body assist Assist Level: Set up assist    Lower Body Dressing/Undressing Lower body dressing      What is the patient wearing?: Underwear/pull up, Pants     Lower body assist Assist for lower body dressing: Minimal Assistance - Patient > 75%     Toileting Toileting    Toileting assist Assist for toileting: Maximal Assistance - Patient 25 - 49% Assistive Device Comment: bedpan   Transfers Chair/bed transfer  Transfers assist  Chair/bed transfer activity did not occur: Safety/medical concerns  Chair/bed transfer assist level: Moderate Assistance - Patient 50 - 74% Chair/bed transfer assistive device: Programmer, multimedia   Ambulation assist   Ambulation activity did not occur: Safety/medical concerns(labial BP and HR sitting EOB.)  Assist level: Minimal Assistance - Patient > 75% Assistive device: Walker-rolling Max distance: 3'   Walk 10 feet activity   Assist  Walk 10 feet activity did not occur: Safety/medical concerns(labial BP and HR sitting EOB.)  Walk 50 feet activity   Assist Walk 50 feet with 2 turns activity did not occur: Safety/medical concerns(labial BP and HR sitting EOB.)         Walk 150 feet activity   Assist Walk 150 feet activity did not occur: Safety/medical concerns(labial BP and HR sitting EOB.)         Walk 10 feet on uneven surface  activity   Assist Walk 10 feet  on uneven surfaces activity did not occur: Safety/medical concerns(labial BP and HR sitting EOB.)         Wheelchair     Assist Will patient use wheelchair at discharge?: Yes Type of Wheelchair: Manual Wheelchair activity did not occur: Safety/medical concerns(labial BP and HR sitting EOB.)  Wheelchair assist level: Supervision/Verbal cueing Max wheelchair distance: 5'    Wheelchair 50 feet with 2 turns activity    Assist    Wheelchair 50 feet with 2 turns activity did not occur: Safety/medical concerns(labial BP and HR sitting EOB.)   Assist Level: Supervision/Verbal cueing   Wheelchair 150 feet activity     Assist  Wheelchair 150 feet activity did not occur: Safety/medical concerns(labial BP and HR sitting EOB.)       Blood pressure (!) 140/46, pulse 76, temperature 98.1 F (36.7 C), temperature source Oral, resp. rate 19, height 5\' 11"  (1.803 m), weight 112.2 kg, SpO2 100 %.  Medical Problem List and Plan: 1.Fxnl and mobility deficitssecondary to PAD causing left AKA  Cont CIR 2.Mechanical AVR/Antithrombotics: -DVT/anticoagulation:Pharmaceutical:Coumadin and heparin GGT INR> 2.5.  Unfractionated heparin within normal range on 9/20   INR 2.4 on 9/20   9/21- INR 2.6- pharmacy stopped Heparin gtt- will con't coumadin mgmt  9/22- INR 2.9  -antiplatelet therapy: N/A 3. Pain Management:  Scheduled low dose flexeril tid to help with spasms.  May need neurontin titrate upwarrds. Scheduled tramadol 50 mg TID and will use oxy only when needs it.  Appears relatively controlled with medication on 9/20  Encouraged desensitization techniques 4. Mood:LCSW to follow for evaluation and support. -antipsychotic agents: N/A 5. Neuropsych: This patientiscapable of making decisions on hisown behalf. 6. Skin/Wound Care:Monitor wound daily. Monitor stump for signs and symptoms of infection  Drainage  continues to improve, decrease dressing changes to daily 7. Fluids/Electrolytes/Nutrition:Monitor I/Os 8. T2DM: Monitor BS ac/hs. Continue Lantus 40 mg bid with Victoza  Resumed amaryl 2mg    9/15- increase Lantus to 44 units BID CBG (last 3)  Recent Labs    08/24/19 2331 08/25/19 0020 08/25/19 0618  GLUCAP 73 108* 138*   Labile on 9/20, monitor for trend  9/21- BGs much better controlled 9. CAD/CAF: Monitor for presyncope/syncope as back on Entresto.   Appreciate cards recs, metoprolol decreased  Dig level low on 9/18  Pulse pressure remains wide on 9/20  Heart rate improving on 9/20  9/21- Cards- decr Metoprolol and Digoxin- pt feeling much better  10 Chronic systolic CHF: Heart healthy diet and monitor for signs of overload. Check weight daily. On Entresto, Demadex, metoprolol and fenofibrate, Vascepa.    Filed Weights   08/22/19 0436 08/23/19 0427 08/25/19 0304  Weight: 100.7 kg 103.4 kg 112.2 kg   ? Reliability on 9/20 11. Urinary retention:   PVRs elevated  Continue Flomax 12. Constipation:Start bowel program as this has been an issues since first surgery.  13.  Acute blood loss anemia  Transfused 1 unit PRBC on 9/16  Hemoglobin 8.6 on 9/20  Continue to monitor 13. Hyponatremia:   Sodium 135 on 9/17,  labs ordered for tomorrow  Continue to monitor 14. OSA: CPAP when sleeping with 2 L bleed in.  15. Acute on chronic renal failure:   Creatinine 1.66 on 9/17, labs ordered for tomorrow  Encourage fluids  9/21- Cr down to 1.4  Continue to monitor 16.  Transaminitis  LFTs elevated on 9/17, labs ordered for tomorrow  9/21- BMP was ordered- will recheck CMP Thursday  Continue to monitor  LOS: 8 days A FACE TO FACE EVALUATION WAS PERFORMED  Rossie Scarfone 08/25/2019, 3:39 PM

## 2019-08-25 NOTE — Progress Notes (Signed)
Hypoglycemic Event  CBG:66  Treatment: Glutose Oral Gel   Symptoms: Asympotomatic denies discomfort  Follow-up CBG: Time1050p: CBG Result:71  Possible Reasons for Event: unk  Comments/MD  Hypoglycemic protocol initiated Follow up after result 73 Glutose gel repeated at 2314 Results 0008 CBG [place]

## 2019-08-25 NOTE — Progress Notes (Signed)
Hypoglycemic Event  CBG: 66  Treatment: peanut butter, graham cracker and po liquid  Symptoms: Asymptomatic  Follow-up CBG: Time:2150 CBG Result:71  Possible Reasons for Event: unknown  Comments/MD  Hypoglycemic protocol initiated   Joseph Hoffman

## 2019-08-25 NOTE — Progress Notes (Signed)
Progress Note  Patient Name: Joseph Hoffman Date of Encounter: 08/25/2019  Primary Cardiologist: Shelva Majestic, MD  CT Surgeon: [name]  Subjective   Feeling much better. He was able to work with PT twice daily.  Mental clarity continues to improve.  Inpatient Medications    Scheduled Meds: . B-complex with vitamin C  1 tablet Oral Daily  . bethanechol  10 mg Oral QID  . cholecalciferol  1,000 Units Oral Q breakfast  . cyclobenzaprine  5 mg Oral TID  . digoxin  0.0625 mg Oral Daily  . docusate sodium  100 mg Oral BID  . escitalopram  10 mg Oral QHS  . ezetimibe  10 mg Oral Daily  . feeding supplement (PRO-STAT SUGAR FREE 64)  30 mL Oral BID  . fenofibrate  160 mg Oral Q breakfast  . glimepiride  2 mg Oral Q breakfast  . hydrocortisone cream   Topical TID  . Icosapent Ethyl  2 g Oral BID  . insulin aspart  0-20 Units Subcutaneous TID WC  . insulin aspart  0-5 Units Subcutaneous QHS  . insulin glargine  44 Units Subcutaneous BID  . liraglutide  1.8 mg Subcutaneous QHS  . metoprolol succinate  12.5 mg Oral Daily  . milk and molasses  1 enema Rectal Once  . mometasone-formoterol  2 puff Inhalation BID  . pantoprazole  40 mg Oral Daily  . polyethylene glycol  17 g Oral BID  . Ensure Max Protein  11 oz Oral Daily  . saccharomyces boulardii  250 mg Oral 3 times weekly  . sacubitril-valsartan  1 tablet Oral BID  . senna-docusate  2 tablet Oral BID  . tamsulosin  0.8 mg Oral QPC supper  . torsemide  20 mg Oral Daily  . torsemide  40 mg Oral Daily  . traMADol  50 mg Oral TID  . vitamin C  500 mg Oral BID  . warfarin  12.5 mg Oral ONCE-1800  . Warfarin - Pharmacist Dosing Inpatient   Does not apply q1800   Continuous Infusions:  PRN Meds: acetaminophen, albuterol, alum & mag hydroxide-simeth, bisacodyl, clonazePAM, guaiFENesin-dextromethorphan, lidocaine, nitroGLYCERIN, oxyCODONE, polyethylene glycol, prochlorperazine **OR** prochlorperazine **OR** prochlorperazine,  sodium chloride flush, sodium phosphate, traZODone   Vital Signs    Vitals:   08/24/19 1933 08/24/19 2218 08/25/19 0304 08/25/19 0822  BP: (!) 121/44  (!) 139/47   Pulse: 99 81 (!) 59   Resp: 15 16 16    Temp: 98 F (36.7 C)  98 F (36.7 C)   TempSrc: Oral  Oral   SpO2: 96%  99% 97%  Weight:   112.2 kg   Height:        Intake/Output Summary (Last 24 hours) at 08/25/2019 1321 Last data filed at 08/25/2019 0749 Gross per 24 hour  Intake 360 ml  Output 2900 ml  Net -2540 ml   Last 3 Weights 08/25/2019 08/23/2019 08/22/2019  Weight (lbs) 247 lb 5.7 oz 227 lb 15.3 oz 222 lb 0.1 oz  Weight (kg) 112.2 kg 103.4 kg 100.7 kg      Telemetry    n/a - Personally Reviewed  ECG    n/a - Personally Reviewed  Physical Exam   VS:  BP (!) 139/47 (BP Location: Right Arm)   Pulse (!) 59   Temp 98 F (36.7 C) (Oral)   Resp 16   Ht 5\' 11"  (1.803 m)   Wt 112.2 kg   SpO2 97%   BMI 34.50 kg/m  ,  BMI Body mass index is 34.5 kg/m. GENERAL:  Chronically ill-appearing HEENT: Pupils equal round and reactive, fundi not visualized, oral mucosa unremarkable NECK:  No jugular venous distention, waveform within normal limits, carotid upstroke brisk and symmetric, no bruits LUNGS:  Clear to auscultation bilaterally HEART: Irregularly irregular.  Bradycardic.   PMI not displaced or sustained,S1 and S2 within normal limits, no S3, no S4, no clicks, no rubs, III/VI diastolic murmur at the LUSB ABD:  Flat, positive bowel sounds normal in frequency in pitch, no bruits, no rebound, no guarding, no midline pulsatile mass, no hepatomegaly, no splenomegaly EXT: L AKA.  no edema, no cyanosis no clubbing SKIN:  No rashes no nodules NEURO:  Cranial nerves II through XII grossly intact, motor grossly intact throughout PSYCH:  Cognitively intact, oriented to person place and time   Labs    High Sensitivity Troponin:  No results for input(s): TROPONINIHS in the last 720 hours.    Chemistry Recent Labs   Lab 08/20/19 0949 08/24/19 0724  NA 135 139  K 4.7 4.2  CL 99 101  CO2 23 27  GLUCOSE 335* 114*  BUN 50* 48*  CREATININE 1.66* 1.36*  CALCIUM 9.0 9.5  PROT 6.3*  --   ALBUMIN 2.5*  --   AST 59*  --   ALT 49*  --   ALKPHOS 40  --   BILITOT 0.8  --   GFRNONAA 41* 52*  GFRAA 48* >60  ANIONGAP 13 11     Hematology Recent Labs  Lab 08/23/19 0626 08/24/19 0724 08/25/19 0506  WBC 10.0 10.1 12.0*  RBC 3.21* 3.25* 3.25*  HGB 8.6* 8.7* 8.4*  HCT 27.5* 27.8* 28.0*  MCV 85.7 85.5 86.2  MCH 26.8 26.8 25.8*  MCHC 31.3 31.3 30.0  RDW 17.7* 17.6* 17.7*  PLT 300 369 374    BNPNo results for input(s): BNP, PROBNP in the last 168 hours.   DDimer No results for input(s): DDIMER in the last 168 hours.   Radiology    No results found.  Cardiac Studies   Cath 06/29/19 Conclusions: 1. Multivessel coronary artery disease, including 50% mid LAD, 80% large OM2, 50% mid RCA, and 80-90% ostial rPDA stenoses. 2. Mildly to moderately elevated left and right heart filling pressures. 3. Mildly reduced Fick cardiac output/index. Recommendations: 1. Gentle post-cath hydration for goal net even fluid balance today. Consider gentle diuresis, as renal function tolerates, beginning tomorrow. 2. Ongoing workup for redo aortic valve replacement +/- CABG per Drs. Claiborne Billings and Ackerman. 3. Initiate heparin infusion 2 hours after TR band removal. 4. Aggressive secondary prevention.  TEE 05/29/19 IMPRESSIONS  1. The left ventricle has moderate-severely reduced systolic function, with an ejection fraction of 30-35%. Left ventricular diffuse hypokinesis. 2. The right ventricle has severely reduced systolic function. The cavity was mildly enlarged. 3. Left atrial size was severely dilated. 4. Right atrial size was severely dilated. 5. The mitral valve is grossly normal. No evidence of mitral valve stenosis. 6. The tricuspid valve was grossly normal. 7. Aortic valve regurgitation is severe by  color flow Doppler. 8. There is evidence of mild plaque in the descending aorta. 9. Moderate to severe global reduction in LV systolic function; mild RVE with severe RV dysfunction; severe biatrial enlargement; s/p mechanical AVR with mean gradient of 14 mmHg (both leaflets appear to be mobile); severe perivalvular AI; mild MR and TR.  Patient Profile     68 y.o. male with permanent atrial fibrillation, chronic systolic and diastolic heart failure,  mechanical AVR in 2013, severe perivalvular AR, hypertension, hyperlipidemia (intolerant of statins) OSA on CPAP, diabetes, CKD 4, and hepatitis  Assessment & Plan    # Chronic systolic and diastolic heart failure: # RV failure: Volume status and renal function are stable.  Continue torsemide.  Metoprolol and digoxin were reduced to shorten the period of diastases given his severe AR with low diastolic blood pressure.  Continue Entresto.    # s/p mechanical AVR:  # Severe perivalvular AR:  Low diastolic blood pressure.  Metoprolol was reduced to reduce time in diastole and he is feeling better.  Heart rates are in the 50-70s.  DBP remains in the 30-50s with a wide pulse pressure.  Reduced metoprolol and digoxin as above. AVR pending recovery from AKA.   # CAD:  Severe CAD on cath 06/2019.  CABG/AVR has been on hold due to non-healing wound requiring amputations.   # Diabetic foot ulcer:  S/p toe amputation 06/2019 followed by BKA 8/28 followed by AKA 08/2019.  Admitted to CIR.  # CKD: Renal function improving with diuresis.   # Intermittent confusion:  Likely attributable to low DBP and slow HR.  Reducing metoprolol and digoxin has helped.   # Hyperlpidemia:  Statin intolerant. Continue Zetia and fenofibrate.  Consider PCSK9 inhibitor as outpatient.   # Persistent atrial fibrillation:  Continue warfarin.  Reduce metoprolol and digoxin as above.       For questions or updates, please contact [place] Please consult [email or web address]  for contact info under        Signed, Skeet Latch, MD  08/25/2019, 1:21 PM

## 2019-08-25 NOTE — Progress Notes (Signed)
Physical Therapy Weekly Progress Note  Patient Details  Name: Joseph Hoffman MRN: [id number] Date of Birth: December 05, 1950  Beginning of progress report period: August 18, 2019 End of progress report period: August 25, 2019  Today's Date: 08/25/2019 PT Individual Time: 1410-1505 PT Individual Time Calculation (min): 55 min   Patient has met 5 of 5 short term goals.  Patient has had slow progress due to low and irregular HR and low diastolic BP causing patient to feel symptomatic in sitting intermittently during therapies intermittently throughout the week. He has demonstrated min-mod A for bed mobility, slide board and stand pivot transfers using a RW with 1 person assisting, initiated gait training in the parallel bars with min A up to 6', and initiated supine exercises and amputee education on edema and pain control with limb wrapping and desensitization techniques.    Patient continues to demonstrate the following deficits muscle weakness, decreased cardiorespiratoy endurance and decreased sitting balance, decreased standing balance, decreased postural control and decreased balance strategies and therefore will continue to benefit from skilled PT intervention to increase functional independence with mobility.  Patient progressing toward long term goals..  Continue plan of care.  PT Short Term Goals Week 1:  PT Short Term Goal 1 (Week 1): Patient will perform bed mobility with min A. PT Short Term Goal 1 - Progress (Week 1): Met PT Short Term Goal 2 (Week 1): Pt will perform bed<>chair transfer w/ mod assist +2 for safety. PT Short Term Goal 2 - Progress (Week 1): Met PT Short Term Goal 3 (Week 1): Pt will tolerate sitting OOB in between therapy sessions for 1 hour. PT Short Term Goal 3 - Progress (Week 1): Met PT Short Term Goal 4 (Week 1): Pt will self-propel w/c 50' w/ supervision PT Short Term Goal 4 - Progress (Week 1): Met PT Short Term Goal 5 (Week 1): Patient will initiate  standing with mod A +2 using LRAD. PT Short Term Goal 5 - Progress (Week 1): Met Week 2:  PT Short Term Goal 1 (Week 2): Patient will perform bed mobility with supervision. PT Short Term Goal 2 (Week 2): Patient will perform sit<>stand with min A using RW. PT Short Term Goal 3 (Week 2): Patient will perform basic transfers with min A. PT Short Term Goal 4 (Week 2): Patient will initiate ambulation with RW.  Skilled Therapeutic Interventions/Progress Updates:     Patient in recliner in room upon PT arrival. Patient alert and agreeable to PT session. Patient reported 8/10 residual limb pain during session, RN made aware. PT provided repositioning, rest breaks, and distraction as pain interventions throughout session. Vitals in sitting: BP 123/41 HR 74 SPO2 98% and patient denied any symptoms with mobility today.   Therapeutic Activity: Bed Mobility: Patient performed sit to supine with supervision with the bed flat and with use of bed rail. Provided verbal cues for lying on his side then rolling to supine. Transfers: Patient performed sit to/from stand x3 in the // bars with mod-min A and stand pivot x2 with mod-min A using the RW. Provided verbal cues for R foot placement, leaning forward to stand, pushing through his UEs to assist him to standing, and sequencing for pivots. He performed a lateral scoot transfer w/c<>drop arm BSC over the toilet and w/c>bed with mod A. He required total A for peri-care and mod A for LB dressing during toileting.   Gait Training:  Patient ambulated 2, 4, and 6 feet in the // bars with min  A. Ambulated with hop-to gait pattern on R with very small hops and decreased control at initial contact on R foot. Provided verbal cues for use of UE to control impact on R foot. Patient reported L shoulder discomfort after, stated it was from dislocating his shoulder when he was 23. PT educated on UE strengthening and management of activity tolerance to avoid overuse injuries in his  UEs.   Wheelchair Mobility:  Patient propelled wheelchair ~150 feet with supervision using B UEs. Provided verbal cues for turning technique. Required total A for management of leg rest and cues for using breaks throughout session.   Patient in bed at end of session with breaks locked, bed alarm set, and all needs within reach. Manual HR irregular and 61 bpm at end of session. Patient denied any symptoms of feeling lightheaded throughout session.    Therapy Documentation Precautions:  Precautions Precautions: Fall Precaution Comments: monitor HR, sats and BP Restrictions Weight Bearing Restrictions: Yes LLE Weight Bearing: Non weight bearing Other Position/Activity Restrictions: L AKA - in ace wrap.  no drainage seen through wrap   Therapy/Group: Individual Therapy  Julianah Marciel L Samil Mecham PT, DPT  08/25/2019, 4:44 PM

## 2019-08-25 NOTE — Progress Notes (Signed)
ANTICOAGULATION CONSULT NOTE - Follow Up Consult  Pharmacy Consult for warfarin Indication: Mechanical valve  Patient Measurements: Height: 5\' 11"  (180.3 cm) Weight: 247 lb 5.7 oz (112.2 kg) IBW/kg (Calculated) : 75.3  Vital Signs: Temp: 98 F (36.7 C) (09/22 0304) Temp Source: Oral (09/22 0304) BP: 139/47 (09/22 0304) Pulse Rate: 59 (09/22 0304)  Labs: Recent Labs    08/23/19 0626 08/24/19 0724 08/25/19 0506  HGB 8.6* 8.7* 8.4*  HCT 27.5* 27.8* 28.0*  PLT 300 369 374  LABPROT 26.1* 27.1* 29.7*  INR 2.4* 2.6* 2.9*  HEPARINUNFRC 0.44 0.57  --   CREATININE  --  1.36*  --     Estimated Creatinine Clearance: 64.4 mL/min (A) (by C-G formula based on SCr of 1.36 mg/dL (H)).  Assessment: 68 year old male on warfarin for mechanical AVR who received INR reversal (Vitamin K 5mg  IV) on 9/9 for an AKA. Patient is on IV Heparin per pharmacy dosing consult and Warfarin was resumed per pharmacy dosing on 9/12. PTA warfarin dose - 10 mg daily except 12.5mg  TTS, admit INR 2.7 (goal 2.5-3.5)  INR remains therapeutic. Heparin d/c'd on 9/21. CBC is stable, and no bleeding issues documented.   Goal of Therapy:  INR 2.5-3.5 Monitor platelets by anticoagulation protocol: Yes   Plan:  - Repeat Warfarin 12.5 mg x 1 at 1800 - Monitor daily INR, CBC, s/sx bleeding - Hopefully can resume home warfarin dose tomorrow  [name], PharmD, BCPS Please check AMION for all [place] contact numbers Clinical Pharmacist 08/25/2019 10:11 AM

## 2019-08-25 NOTE — Progress Notes (Signed)
Occupational Therapy Session Note  Patient Details  Name: Joseph Hoffman MRN: [id number] Date of Birth: August 18, 1951  Today's Date: 08/25/2019 OT Individual Time: 8850-2774 OT Individual Time Calculation (min): 75 min    Short Term Goals: Week 1:  OT Short Term Goal 1 (Week 1): Pt will be able to transfer to [facility] and or toilet with mod A using LRAD. OT Short Term Goal 2 (Week 1): Pt will be able to bathe LB with min A. OT Short Term Goal 3 (Week 1): Pt will be able to dress LB with mod A. OT Short Term Goal 4 (Week 1): Pt will be able to maintain static stand for 1 minute with mod A to pull clothing over hips.  Skilled Therapeutic Interventions/Progress Updates:    Pt received supine with c/o pain 6/10, in distal end of the residual limb, aching. OOB mobility and emotional support provided for pain relief. BP supine 125/44, HR 69. Pt completed bed mobility to EOB with (S), heavy use of bed rails. Pt's residual limb was re-wrapped with gauze and ace wrap. Pt completed sit > stand from EOB with mod +2. Pt pivoted to the w/c with mod A. Pt completed UB bathing at the sink with set up assist. Pt was taken to the end of the bed where he completed sit > stand with UE support on the bottom bed rail. Pt required mod +2 support to stand. Min A overall to don pants/underwear. Pt cued for energy conservation techniques. Pt propelled w/c to the therapy gym with slow pace but no cueing needed. Pt transferred to the mat with the RW with mod A overall. Pt set up for mirror therapy for phantom pain in residual limb. Pt reported pain relief and great success with the mirror. Pt completed squat pivot back to w/c with mod A. Pt returned to room and left sitting up with all needs met.   Therapy Documentation Precautions:  Precautions Precautions: Fall Precaution Comments: monitor HR, sats and BP Restrictions Weight Bearing Restrictions: Yes LLE Weight Bearing: Non weight bearing Other Position/Activity  Restrictions: L AKA - in ace wrap.  no drainage seen through wrap  Therapy/Group: Individual Therapy  Curtis Sites 08/25/2019, 7:54 AM

## 2019-08-25 NOTE — Progress Notes (Signed)
RT placed pt on CPAP dream station for the night on auto titrate 14 max 4 min with 4lpm bled into the system. Pt respiratory stable at this time on CPAP. RT will continue to monitor.

## 2019-08-26 ENCOUNTER — Inpatient Hospital Stay (HOSPITAL_COMMUNITY): Payer: Medicare Other | Admitting: Occupational Therapy

## 2019-08-26 ENCOUNTER — Inpatient Hospital Stay (HOSPITAL_COMMUNITY): Payer: Medicare Other

## 2019-08-26 LAB — GLUCOSE, CAPILLARY
Glucose-Capillary: 152 mg/dL — ABNORMAL HIGH (ref 70–99)
Glucose-Capillary: 205 mg/dL — ABNORMAL HIGH (ref 70–99)
Glucose-Capillary: 202 mg/dL — ABNORMAL HIGH (ref 70–99)
Glucose-Capillary: 201 mg/dL — ABNORMAL HIGH (ref 70–99)

## 2019-08-26 LAB — CBC WITH DIFFERENTIAL/PLATELET
Abs Immature Granulocytes: 0.19 10*3/uL — ABNORMAL HIGH (ref 0.00–0.07)
Basophils Absolute: 0.1 10*3/uL (ref 0.0–0.1)
Basophils Relative: 1 %
Eosinophils Absolute: 0.6 10*3/uL — ABNORMAL HIGH (ref 0.0–0.5)
Eosinophils Relative: 5 %
HCT: 25.9 % — ABNORMAL LOW (ref 39.0–52.0)
Hemoglobin: 8.5 g/dL — ABNORMAL LOW (ref 13.0–17.0)
Immature Granulocytes: 2 %
Lymphocytes Relative: 8 %
Lymphs Abs: 0.9 10*3/uL (ref 0.7–4.0)
MCH: 27.2 pg (ref 26.0–34.0)
MCHC: 32.8 g/dL (ref 30.0–36.0)
MCV: 83 fL (ref 80.0–100.0)
Monocytes Absolute: 0.8 10*3/uL (ref 0.1–1.0)
Monocytes Relative: 7 %
Neutro Abs: 8.8 10*3/uL — ABNORMAL HIGH (ref 1.7–7.7)
Neutrophils Relative %: 77 %
Platelets: 361 10*3/uL (ref 150–400)
RBC: 3.12 MIL/uL — ABNORMAL LOW (ref 4.22–5.81)
RDW: 17.5 % — ABNORMAL HIGH (ref 11.5–15.5)
WBC: 11.4 10*3/uL — ABNORMAL HIGH (ref 4.0–10.5)
nRBC: 0 % (ref 0.0–0.2)

## 2019-08-26 LAB — COMPREHENSIVE METABOLIC PANEL
ALT: 37 U/L (ref 0–44)
AST: 30 U/L (ref 15–41)
Albumin: 2.8 g/dL — ABNORMAL LOW (ref 3.5–5.0)
Alkaline Phosphatase: 43 U/L (ref 38–126)
Anion gap: 10 (ref 5–15)
BUN: 68 mg/dL — ABNORMAL HIGH (ref 8–23)
CO2: 23 mmol/L (ref 22–32)
Calcium: 9.3 mg/dL (ref 8.9–10.3)
Chloride: 100 mmol/L (ref 98–111)
Creatinine, Ser: 1.69 mg/dL — ABNORMAL HIGH (ref 0.61–1.24)
GFR calc Af Amer: 47 mL/min — ABNORMAL LOW (ref >60)
GFR calc non Af Amer: 40 mL/min — ABNORMAL LOW (ref >60)
Glucose, Bld: 236 mg/dL — ABNORMAL HIGH (ref 70–99)
Potassium: 4.6 mmol/L (ref 3.5–5.1)
Sodium: 133 mmol/L — ABNORMAL LOW (ref 135–145)
Total Bilirubin: 0.8 mg/dL (ref 0.3–1.2)
Total Protein: 6.4 g/dL — ABNORMAL LOW (ref 6.5–8.1)

## 2019-08-26 LAB — URINALYSIS, ROUTINE W REFLEX MICROSCOPIC
Bilirubin Urine: NEGATIVE
Glucose, UA: NEGATIVE mg/dL
Hgb urine dipstick: NEGATIVE
Ketones, ur: NEGATIVE mg/dL
Leukocytes,Ua: NEGATIVE
Nitrite: NEGATIVE
Protein, ur: NEGATIVE mg/dL
Specific Gravity, Urine: 1.009 (ref 1.005–1.030)
pH: 6 (ref 5.0–8.0)

## 2019-08-26 LAB — PROTIME-INR
INR: 3.1 — ABNORMAL HIGH (ref 0.8–1.2)
Prothrombin Time: 31.5 seconds — ABNORMAL HIGH (ref 11.4–15.2)

## 2019-08-26 MED ORDER — WARFARIN SODIUM 5 MG PO TABS
10.0000 mg | ORAL_TABLET | Freq: Once | ORAL | Status: AC
  Administered 2019-08-26: 17:00:00 10 mg via ORAL
  Filled 2019-08-26: qty 2

## 2019-08-26 NOTE — Progress Notes (Signed)
[facility] PHYSICAL MEDICINE & REHABILITATION PROGRESS NOTE   Subjective/Complaints:   Pt reports doing poorly- feels like has cold, poor appetite, doesn't want to drink or eat at all; feeling chills overnight and now; overall felt like has cold, however denies sneezing, coughing.   ROS: Denies CP, SOB, N/V/D  Objective:   No results found. Recent Labs    08/25/19 0506 08/26/19 1014  WBC 12.0* 11.4*  HGB 8.4* 8.5*  HCT 28.0* 25.9*  PLT 374 361   Recent Labs    08/24/19 0724 08/26/19 1014  NA 139 133*  K 4.2 4.6  CL 101 100  CO2 27 23  GLUCOSE 114* 236*  BUN 48* 68*  CREATININE 1.36* 1.69*  CALCIUM 9.5 9.3    Intake/Output Summary (Last 24 hours) at 08/26/2019 1135 Last data filed at 08/26/2019 1015 Gross per 24 hour  Intake 240 ml  Output 2300 ml  Net -2060 ml     Physical Exam: Vital Signs Blood pressure (!) 106/43, pulse 71, temperature 98 F (36.7 C), resp. rate 15, height 5\' 11"  (1.803 m), weight 112.2 kg, SpO2 100 %. Pt lying in bed; poor color; supine; more nasal, but no other signs of URI, no acute distress HENT: Normocephalic.  Atraumatic. Eyes: EOMI. No discharge. Cardiovascular: RRR has click Respiratory: CTA B/L- no wheezes, rales, or rhonchi and good air movement B/L GI: protuberant, NT, ND, Soft, (+) hypoactive BS Skin: Left AKA with dressing C/D/I- per nurse, looks great Psych: appears sedated/tired/malaise Musc: Left AKA with edema and tenderness Neurological: Alert Motor: Bilateral upper extremities: 5/5 proximal distal Right lower extremity: 4/5 proximal distal, stable Left lower extremity: 4+/5 (pain inhibition), stable   Assessment/Plan: 1. Functional deficits secondary to new L AKA due to ischemia/gangrene which require 3+ hours per day of interdisciplinary therapy in a comprehensive inpatient rehab setting.  Physiatrist is providing close team supervision and 24 hour management of active medical problems listed  below.  Physiatrist and rehab team continue to assess barriers to discharge/monitor patient progress toward functional and medical goals  Care Tool:  Bathing    Body parts bathed by patient: Right arm, Left arm, Chest, Abdomen, Front perineal area, Right upper leg, Left upper leg, Face, Right lower leg, Buttocks   Body parts bathed by helper: Buttocks Body parts n/a: Left lower leg   Bathing assist Assist Level: Minimal Assistance - Patient > 75%     Upper Body Dressing/Undressing Upper body dressing   What is the patient wearing?: Pull over shirt    Upper body assist Assist Level: Set up assist    Lower Body Dressing/Undressing Lower body dressing      What is the patient wearing?: Underwear/pull up, Pants     Lower body assist Assist for lower body dressing: Minimal Assistance - Patient > 75%     Toileting Toileting    Toileting assist Assist for toileting: Maximal Assistance - Patient 25 - 49% Assistive Device Comment: bedpan   Transfers Chair/bed transfer  Transfers assist  Chair/bed transfer activity did not occur: Safety/medical concerns  Chair/bed transfer assist level: Moderate Assistance - Patient 50 - 74% Chair/bed transfer assistive device: Armrests   Locomotion Ambulation   Ambulation assist   Ambulation activity did not occur: Safety/medical concerns(labial BP and HR sitting EOB.)  Assist level: Minimal Assistance - Patient > 75% Assistive device: Parallel bars Max distance: 6'   Walk 10 feet activity   Assist  Walk 10 feet activity did not occur: Safety/medical concerns(labial BP and  HR sitting EOB.)        Walk 50 feet activity   Assist Walk 50 feet with 2 turns activity did not occur: Safety/medical concerns(labial BP and HR sitting EOB.)         Walk 150 feet activity   Assist Walk 150 feet activity did not occur: Safety/medical concerns(labial BP and HR sitting EOB.)         Walk 10 feet on uneven surface   activity   Assist Walk 10 feet on uneven surfaces activity did not occur: Safety/medical concerns(labial BP and HR sitting EOB.)         Wheelchair     Assist Will patient use wheelchair at discharge?: Yes Type of Wheelchair: Manual Wheelchair activity did not occur: Safety/medical concerns(labial BP and HR sitting EOB.)  Wheelchair assist level: Set up assist, Supervision/Verbal cueing Max wheelchair distance: 150'    Wheelchair 50 feet with 2 turns activity    Assist    Wheelchair 50 feet with 2 turns activity did not occur: Safety/medical concerns(labial BP and HR sitting EOB.)   Assist Level: Set up assist, Supervision/Verbal cueing   Wheelchair 150 feet activity     Assist  Wheelchair 150 feet activity did not occur: Safety/medical concerns(labial BP and HR sitting EOB.)   Assist Level: Set up assist, Supervision/Verbal cueing   Blood pressure (!) 106/43, pulse 71, temperature 98 F (36.7 C), resp. rate 15, height 5\' 11"  (1.803 m), weight 112.2 kg, SpO2 100 %.  Medical Problem List and Plan: 1.Fxnl and mobility deficitssecondary to PAD causing left AKA  Cont CIR 2.Mechanical AVR/Antithrombotics: -DVT/anticoagulation:Pharmaceutical:Coumadin and heparin GGT INR> 2.5.  Unfractionated heparin within normal range on 9/20   INR 2.4 on 9/20   9/21- INR 2.6- pharmacy stopped Heparin gtt- will con't coumadin mgmt  9/22- INR 2.9  9/23- INR 3.1- per pharmacy  -antiplatelet therapy: N/A 3. Pain Management:  Scheduled low dose flexeril tid to help with spasms.  May need neurontin titrate upwarrds. Scheduled tramadol 50 mg TID and will use oxy only when needs it.  Appears relatively controlled with medication on 9/20  Encouraged desensitization techniques 4. Mood:LCSW to follow for evaluation and support. -antipsychotic agents: N/A 5. Neuropsych: This patientiscapable of making decisions on hisown  behalf. 6. Skin/Wound Care:Monitor wound daily. Monitor stump for signs and symptoms of infection  Drainage continues to improve, decrease dressing changes to daily 7. Fluids/Electrolytes/Nutrition:Monitor I/Os 8. T2DM: Monitor BS ac/hs. Continue Lantus 40 mg bid with Victoza  Resumed amaryl 2mg    9/15- increase Lantus to 44 units BID CBG (last 3)  Recent Labs    08/25/19 0020 08/25/19 0618 08/25/19 1250  GLUCAP 108* 138* 204*   Labile on 9/20, monitor for trend  9/21- BGs much better controlled 9. CAD/CAF: Monitor for presyncope/syncope as back on Entresto.   Appreciate cards recs, metoprolol decreased  Dig level low on 9/18  Pulse pressure remains wide on 9/20  Heart rate improving on 9/20  9/21- Cards- decr Metoprolol and Digoxin- pt feeling much better  10 Chronic systolic CHF: Heart healthy diet and monitor for signs of overload. Check weight daily. On Entresto, Demadex, metoprolol and fenofibrate, Vascepa.    Filed Weights   08/23/19 0427 08/25/19 0304 08/26/19 0403  Weight: 103.4 kg 112.2 kg 112.2 kg   ? Reliability on 9/20  9/23- Weight stable since yesterday- not sure if previous weight accurate 11. Urinary retention:   PVRs elevated  Continue Flomax  9/23- voiding "all night" per pt- which  was new- didn't require cath o/n 12. Constipation:Start bowel program as this has been an issues since first surgery.   9/23- LBM o/n 13.  Acute blood loss anemia  Transfused 1 unit PRBC on 9/16  Hemoglobin 8.6 on 9/20  Continue to monitor 13. Hyponatremia:   Sodium 135 on 9/17, labs ordered for tomorrow  9/23- Na 133  Continue to monitor 14. OSA: CPAP when sleeping with 2 L bleed in.  15. Acute on chronic renal failure:   Creatinine 1.66 on 9/17, labs ordered for tomorrow  Encourage fluids  9/21- Cr down to 1.4  9/23- Cr up to 1.69 and BUN up to 68- is dry, Cards held QHS torsemide   Continue to monitor 16.  Transaminitis  LFTs elevated on 9/17, labs  ordered for tomorrow  9/21- BMP was ordered- will recheck CMP Thursday  Continue to monitor 17. Lethargy/malaise  9/23- U/A (-) for UTI- CBC shows mild leukocytosis; of 11.4k can't just start ABX, unsure as to why- AKA looks OK, per nursing; might be cold/URI (is more nasal); could be dehydration since Cr up and BUN significantly up. Will check labs in AM and monitor more closely.   LOS: 9 days A FACE TO FACE EVALUATION WAS PERFORMED  Amyriah Buras 08/26/2019, 11:35 AM

## 2019-08-26 NOTE — Progress Notes (Addendum)
Physical Therapy Session Note  Patient Details  Name: Joseph Hoffman MRN: [id number] Date of Birth: 07-Feb-1951  Today's Date: 08/26/2019 PT Individual Time: 1415-1445 PT Individual Time Calculation (min): 30 min   Short Term Goals:  Week 2:  PT Short Term Goal 1 (Week 2): Patient will perform bed mobility with supervision. PT Short Term Goal 2 (Week 2): Patient will perform sit<>stand with min A using RW. PT Short Term Goal 3 (Week 2): Patient will perform basic transfers with min A. PT Short Term Goal 4 (Week 2): Patient will initiate ambulation with RW.    Skilled Therapeutic Interventions/Progress Updates:  Pt dozing in bed.  He stated that he felt terrible and was covered in sweat.  Pt doffed gown.  PT provided pt with a wet washcloth and he wiped down his upper body and face; PT wiped off his back.  Rolling L><R using bed features with supervision.  PT switched out bed pads. Pt scooted to [facility] using bil hands on headboard, cues for use of RLE.  At end of session, pt resting in L side lying with pillow between thighs. Bed alarm set and needs at hand.     Therapy Documentation Precautions:  Precautions Precautions: Fall Precaution Comments: monitor HR, sats and BP Restrictions Weight Bearing Restrictions: Yes LLE Weight Bearing: Non weight bearing Other Position/Activity Restrictions: L AKA - in ace wrap.  no drainage seen through wrap  Pain: pt denied       Therapy/Group: Individual Therapy  Trula Frede 08/26/2019, 2:52 PM

## 2019-08-26 NOTE — Progress Notes (Signed)
ANTICOAGULATION CONSULT NOTE - Follow Up Consult  Pharmacy Consult for warfarin Indication: Mechanical valve  Patient Measurements: Height: 5\' 11"  (180.3 cm) Weight: 247 lb 5.7 oz (112.2 kg) IBW/kg (Calculated) : 75.3  Vital Signs: Temp: 98 F (36.7 C) (09/23 0814) Temp Source: Oral (09/23 0403) BP: 106/43 (09/23 0814) Pulse Rate: 71 (09/23 0814)  Labs: Recent Labs    08/24/19 0724 08/25/19 0506 08/26/19 0734  HGB 8.7* 8.4*  --   HCT 27.8* 28.0*  --   PLT 369 374  --   LABPROT 27.1* 29.7* 31.5*  INR 2.6* 2.9* 3.1*  HEPARINUNFRC 0.57  --   --   CREATININE 1.36*  --   --     Estimated Creatinine Clearance: 64.4 mL/min (A) (by C-G formula based on SCr of 1.36 mg/dL (H)).  Assessment: 68 year old male on warfarin for mechanical AVR who received INR reversal (Vitamin K 5mg  IV) on 9/9 for an AKA. Patient is on IV Heparin per pharmacy dosing consult and Warfarin was resumed per pharmacy dosing on 9/12. PTA warfarin dose - 10 mg daily except 12.5mg  TTS, admit INR 2.7 (goal 2.5-3.5)  INR remains therapeutic. Heparin d/c'd on 9/21. CBC is stable, and no bleeding issues documented.   Goal of Therapy:  INR 2.5-3.5 Monitor platelets by anticoagulation protocol: Yes   Plan:  - Warfarin 10 mg x 1 at 1800 - resume home dose - Monitor daily INR, CBC, s/sx bleeding  [name], PharmD, BCPS Please check AMION for all [facility] contact numbers Clinical Pharmacist 08/26/2019 10:11 AM

## 2019-08-26 NOTE — Progress Notes (Signed)
Occupational Therapy Weekly Progress Note  Patient Details  Name: Joseph Hoffman MRN: [id number] Date of Birth: 1951/01/02  Beginning of progress report period: August 18, 2019 End of progress report period: August 26, 2019  Today's Date: 08/26/2019 OT Individual Time: 0730-0800 OT Individual Time Calculation (min): 30 min  and Today's Date: 08/26/2019 OT Missed Time: 4 Minutes Missed Time Reason: Patient ill (comment)   Patient has met 3 of 4 short term goals.  Pt is making slow progress towards goals due to various medical issues impacting progress.  Pt continues to have fluctuations in BP and HR causing pt to be symptomatic with dizziness upon sitting at EOB.  Pt has demonstrated ability to complete bed mobility at min-mod assist level and ability to engage in LB bathing and dressing with lateral leans with CGA, however continues to require +2 assist for sit > stand when managing clothing at standing level.  Pt has demonstrated ability to complete transfers bed > w/c with Min assist with RW and Min A when utilizing SB and Mod assist transfer to toilet/BSC.  Pt has been unable to maintain standing >20 seconds due to decreased endurance and occasional onset of dizziness with upright mobility.    Patient continues to demonstrate the following deficits: muscle weakness, decreased cardiorespiratoy endurance, decreased memory and delayed processing and decreased sitting balance and decreased standing balance and therefore will continue to benefit from skilled OT intervention to enhance overall performance with BADL and Reduce care partner burden.  Patient progressing toward long term goals..  Continue plan of care.  OT Short Term Goals Week 1:  OT Short Term Goal 1 (Week 1): Pt will be able to transfer to [facility] and or toilet with mod A using LRAD. OT Short Term Goal 1 - Progress (Week 1): Met OT Short Term Goal 2 (Week 1): Pt will be able to bathe LB with min A. OT Short Term Goal 2 -  Progress (Week 1): Met OT Short Term Goal 3 (Week 1): Pt will be able to dress LB with mod A. OT Short Term Goal 3 - Progress (Week 1): Met OT Short Term Goal 4 (Week 1): Pt will be able to maintain static stand for 1 minute with mod A to pull clothing over hips. OT Short Term Goal 4 - Progress (Week 1): Progressing toward goal Week 2:  OT Short Term Goal 1 (Week 2): Pt will be able to transfer to [facility] and or toilet with min A using LRAD. OT Short Term Goal 2 (Week 2): Pt will be able to bathe LB with S OT Short Term Goal 3 (Week 2): Pt will be able to dress LB with min A at sit > stand level OT Short Term Goal 4 (Week 2): Pt will be able to maintain static stand for 1 minute with min A to pull clothing over hips.  Skilled Therapeutic Interventions/Progress Updates:    Treatment session with focus on bed mobility to engage in self-care retraining as pt reporting not feeling well.  Upon arrival, pt reports feeling "sick as a dog".  Pt reports that he is sweaty and wants to clean up.  Provided pt with cool cloth for face and then engaged in UB bathing at bed level with setup.  Noted pt to have been incontinent of stool, engaged in rolling at bed with use of bed rails with CGA to allow therapist to complete hygiene.  Pt declined any OOB activity and declined donning clothes - therefore donned hospital  gown at bed level and changed bedsheets while pt rolling Rt and Lt.  Pt reports cold, therefore covered with blankets and pt left supine in bed with all needs in reach.  Notified RN of pt complaints.    Therapy Documentation Precautions:  Precautions Precautions: Fall Precaution Comments: monitor HR, sats and BP Restrictions Weight Bearing Restrictions: Yes LLE Weight Bearing: Non weight bearing Other Position/Activity Restrictions: L AKA - in ace wrap.  no drainage seen through wrap General:   Vital Signs: Therapy Vitals Temp: 98 F (36.7 C) Temp Source: Oral Pulse Rate: (!) 51 Resp: 15 BP:  (!) 136/41 Patient Position (if appropriate): Lying Oxygen Therapy SpO2: 99 % O2 Device: Room Air Pain:  Pt with no c/o pain.  Just stating feeling ill.   Therapy/Group: Individual Therapy  Joseph Hoffman 08/26/2019, 7:27 AM

## 2019-08-26 NOTE — Patient Care Conference (Signed)
Inpatient RehabilitationTeam Conference and Plan of Care Update Date: 08/26/2019   Time: 10:25 AM    Patient Name: Joseph Hoffman      Medical Record Number: [id number]  Date of Birth: 09-04-1951 Sex: Male         Room/Bed: 4W21C/4W21C-01 Payor Info: Payor: MEDICARE / Plan: MEDICARE PART A AND B / Product Type: *No Product type* /    Admit Date/Time:  08/17/2019  5:57 PM  Primary Diagnosis:  Above knee amputation of left lower extremity [facility])  Patient Active Problem List   Diagnosis Date Noted  . Anxiety state   . Widened pulse pressure   . Transaminitis   . Acute blood loss anemia   . Labile blood glucose   . Above knee amputation of left lower extremity ([place]) 08/17/2019  . Nonhealing surgical wound 08/13/2019  . Non-healing amputation site ([place]) 08/13/2019  . Type 2 diabetes mellitus with peripheral neuropathy (HCC)   . Chronic combined systolic and diastolic CHF (congestive heart failure) ([place])   . Myalgia   . Urinary retention   . Insomnia due to medical condition   . Drug-induced constipation   . Anticoagulated on Coumadin   . Unilateral complete BKA, left, initial encounter ([place]) 08/05/2019  . Leukocytosis   . Acute on chronic anemia   . Post-operative pain   . Nausea & vomiting 07/29/2019  . Diarrhea 07/29/2019  . Diabetic ulcer of left foot associated with diabetes mellitus due to underlying condition ([place]) 07/29/2019  . Left ventricular ejection fraction of 30% to 35% 07/29/2019  . PAD (peripheral artery disease) ([place]) 07/28/2019  . Long term (current) use of anticoagulants 07/13/2019  . CAD, multiple vessel 07/10/2019  . Aortic valve regurgitation   . Aortic valve disease   . Foot ulcer, left ([place]) 06/26/2019  . Depression 05/28/2019  . Congestive heart failure (CHF) ([place]) 05/13/2019  . H/O heart valve replacement with mechanical valve   . Type II diabetes mellitus with renal manifestations ([place]) 05/12/2019  . Chest pain 05/12/2019  . Elevated troponin  05/12/2019  . Stage 3 chronic kidney disease ([place]) 05/12/2019  . Acute on chronic systolic (congestive) heart failure ([place]) 05/12/2019  . CRI (chronic renal insufficiency), stage 3 (moderate) ([place]) 04/09/2019  . Acute combined systolic and diastolic heart failure ([place]) 04/09/2019  . Essential hypertension 06/03/2017  . Dyslipidemia, goal LDL below 70 04/11/2017  . AS (aortic stenosis) 10/28/2012  . Acute on chronic systolic heart failure, re-admitted 10/12/12 10/12/2012  . S/P AVR, 09/29/12, [employer]. (discharged 10/05/12) 09/30/2012  . Permanent atrial fibrillation, since 1994 09/30/2012  . Type 2 IDDM 09/30/2012  . OSA on CPAP 09/30/2012  . Normal coronary arteries at cath Oct 2013 09/30/2012  . Chronic anticoagulation 09/30/2012  . Severe aortic stenosis 09/25/2012    Expected Discharge Date: Expected Discharge Date: 09/15/19  Team Members Present: Physician leading conference: Dr. Courtney Heys Social Worker Present: Ovidio Kin, LCSW Nurse Present: Dorien Chihuahua, RN PT Present: Apolinar Junes, PT OT Present: Willeen Cass, OT SLP Present: Other (comment)(Erin Smith-SP) PPS Coordinator present : Gunnar Fusi, SLP     Current Status/Progress Goal Weekly Team Focus  Bowel/Bladder   incont of bladder at times, incont of bowel at times  cont of b+B  timed toileting and offer urinal regularly   Swallow/Nutrition/ Hydration             ADL's   CGA to S with slide board transfers, from EOB using lateral leans can bathe and dress LB  with min A,  toileting on BSC min - mod A  mod I overall; shower transfers-supervision  ADL training, functional mobility with a focus on sit to stand,, limb care, education, activity tolerance   Mobility   Min-mod A bed mobility, mod A sit <>stand, min-mod A SBT, mod A gait 6 feet in parallel bars  S-mod I w/c level, gait 10' into bathroom with RW  Amputee education, strengthening/ROM, balance, functional mobility, w/c mobility, activity  tolerance, pain/edema control, patient/family education   Communication             Safety/Cognition/ Behavioral Observations            Pain   pain left leg, managed fairly well with ultram and tylenol  pain at or below level 4  monitor effectiveness of pain medication   Skin   incision left leg healing  continued healing without infection or dehiscence  monitor draining and healing of wound      *See Care Plan and progress notes for long and short-term goals.     Barriers to Discharge  Current Status/Progress Possible Resolutions Date Resolved   Nursing                  PT                    OT                  SLP                SW                Discharge Planning/Teaching Needs:  Pt feeling better and able to participate fully in therapies. Will need to be mod/i to be able to go home with wife who has health issues fo her own      Team Discussion:  Goals mod/i-supervision level-10 ft to bathroom. Nausated today and not able to participate in therapies. Check for UA. Flucuates in his levels. Decreased BP meds. Check labs in am. Voiding better. Will need long length of stay to reach these goals.   Revisions to Treatment Plan:  Dc 10/13    Medical Summary Current Status: feeling ill- Cr and BUN up compared to baseline; U/A (-)- poor participation today Weekly Focus/Goal: improve kidney function- Cards on board  Barriers to Discharge: Behavior;Decreased family/caregiver support;Medical stability;Neurogenic Bowel & Bladder;Incontinence;Weight;Wound care;Weight bearing restrictions;Other (comments)  Barriers to Discharge Comments: L AKA Possible Resolutions to Barriers: monitor for Sx's of illness   Continued Need for Acute Rehabilitation Level of Care: The patient requires daily medical management by a physician with specialized training in physical medicine and rehabilitation for the following reasons: Direction of a multidisciplinary physical rehabilitation program  to maximize functional independence : Yes Medical management of patient stability for increased activity during participation in an intensive rehabilitation regime.: Yes Analysis of laboratory values and/or radiology reports with any subsequent need for medication adjustment and/or medical intervention. : Yes   I attest that I was present, lead the team conference, and concur with the assessment and plan of the team. Teleconference held due to COVID 19   Tyeesha Riker, Gardiner Rhyme 08/27/2019, 10:33 AM

## 2019-08-26 NOTE — Progress Notes (Signed)
Progress Note  Patient Name: Joseph Hoffman Date of Encounter: 08/26/2019  Primary Cardiologist: Shelva Majestic, MD  CT Surgeon: [name]  Subjective   Feeling poorly today.  He is diaphoretic and weak.  Reports he is being worked up for UTI.   Inpatient Medications    Scheduled Meds: . B-complex with vitamin C  1 tablet Oral Daily  . bethanechol  10 mg Oral QID  . cholecalciferol  1,000 Units Oral Q breakfast  . cyclobenzaprine  5 mg Oral TID  . digoxin  0.0625 mg Oral Daily  . docusate sodium  100 mg Oral BID  . escitalopram  10 mg Oral QHS  . ezetimibe  10 mg Oral Daily  . feeding supplement (PRO-STAT SUGAR FREE 64)  30 mL Oral BID  . fenofibrate  160 mg Oral Q breakfast  . glimepiride  2 mg Oral Q breakfast  . hydrocortisone cream   Topical TID  . Icosapent Ethyl  2 g Oral BID  . insulin aspart  0-20 Units Subcutaneous TID WC  . insulin aspart  0-5 Units Subcutaneous QHS  . insulin glargine  44 Units Subcutaneous BID  . liraglutide  1.8 mg Subcutaneous QHS  . metoprolol succinate  12.5 mg Oral Daily  . milk and molasses  1 enema Rectal Once  . mometasone-formoterol  2 puff Inhalation BID  . pantoprazole  40 mg Oral Daily  . polyethylene glycol  17 g Oral BID  . Ensure Max Protein  11 oz Oral Daily  . saccharomyces boulardii  250 mg Oral 3 times weekly  . sacubitril-valsartan  1 tablet Oral BID  . senna-docusate  2 tablet Oral BID  . tamsulosin  0.8 mg Oral QPC supper  . torsemide  20 mg Oral Daily  . torsemide  40 mg Oral Daily  . traMADol  50 mg Oral TID  . vitamin C  500 mg Oral BID  . warfarin  10 mg Oral ONCE-1800  . Warfarin - Pharmacist Dosing Inpatient   Does not apply q1800   Continuous Infusions:  PRN Meds: acetaminophen, albuterol, alum & mag hydroxide-simeth, bisacodyl, clonazePAM, guaiFENesin-dextromethorphan, lidocaine, nitroGLYCERIN, oxyCODONE, polyethylene glycol, prochlorperazine **OR** prochlorperazine **OR** prochlorperazine, sodium  chloride flush, sodium phosphate, traZODone   Vital Signs    Vitals:   08/25/19 2203 08/26/19 0403 08/26/19 0744 08/26/19 0814  BP:  (!) 136/41  (!) 106/43  Pulse: 71 (!) 51  71  Resp: 18 15    Temp:  98 F (36.7 C)  98 F (36.7 C)  TempSrc:  Oral    SpO2: 97% 99% 96% 100%  Weight:  112.2 kg    Height:        Intake/Output Summary (Last 24 hours) at 08/26/2019 1127 Last data filed at 08/26/2019 1015 Gross per 24 hour  Intake 240 ml  Output 2300 ml  Net -2060 ml   Last 3 Weights 08/26/2019 08/25/2019 08/23/2019  Weight (lbs) 247 lb 5.7 oz 247 lb 5.7 oz 227 lb 15.3 oz  Weight (kg) 112.2 kg 112.2 kg 103.4 kg      Telemetry    n/a - Personally Reviewed  ECG    n/a - Personally Reviewed  Physical Exam   VS:  BP (!) 106/43 (BP Location: Right Arm)   Pulse 71   Temp 98 F (36.7 C)   Resp 15   Ht 5\' 11"  (1.803 m)   Wt 112.2 kg   SpO2 100%   BMI 34.50 kg/m  , BMI  Body mass index is 34.5 kg/m. GENERAL:  Chronically ill-appearing.  Diaphoretic. HEENT: Pupils equal round and reactive, fundi not visualized, oral mucosa unremarkable NECK:  No jugular venous distention, waveform within normal limits, carotid upstroke brisk and symmetric, no bruits LUNGS:  Clear to auscultation bilaterally HEART: Irregularly irregular.   PMI not displaced or sustained,S1 and S2 within normal limits, no S3, no S4, no clicks, no rubs, III/VI diastolic murmur at the LUSB ABD:  Flat, positive bowel sounds normal in frequency in pitch, no bruits, no rebound, no guarding, no midline pulsatile mass, no hepatomegaly, no splenomegaly EXT: L AKA.  no edema, no cyanosis no clubbing SKIN:  No rashes no nodules NEURO:  Cranial nerves II through XII grossly intact, motor grossly intact throughout PSYCH:  Cognitively intact, oriented to person place and time   Labs    High Sensitivity Troponin:  No results for input(s): TROPONINIHS in the last 720 hours.    Chemistry Recent Labs  Lab 08/20/19 0949  08/24/19 0724 08/26/19 1014  NA 135 139 133*  K 4.7 4.2 4.6  CL 99 101 100  CO2 23 27 23   GLUCOSE 335* 114* 236*  BUN 50* 48* 68*  CREATININE 1.66* 1.36* 1.69*  CALCIUM 9.0 9.5 9.3  PROT 6.3*  --  6.4*  ALBUMIN 2.5*  --  2.8*  AST 59*  --  30  ALT 49*  --  37  ALKPHOS 40  --  43  BILITOT 0.8  --  0.8  GFRNONAA 41* 52* 40*  GFRAA 48* >60 47*  ANIONGAP 13 11 10      Hematology Recent Labs  Lab 08/24/19 0724 08/25/19 0506 08/26/19 1014  WBC 10.1 12.0* 11.4*  RBC 3.25* 3.25* 3.12*  HGB 8.7* 8.4* 8.5*  HCT 27.8* 28.0* 25.9*  MCV 85.5 86.2 83.0  MCH 26.8 25.8* 27.2  MCHC 31.3 30.0 32.8  RDW 17.6* 17.7* 17.5*  PLT 369 374 361    BNPNo results for input(s): BNP, PROBNP in the last 168 hours.   DDimer No results for input(s): DDIMER in the last 168 hours.   Radiology    No results found.  Cardiac Studies   Cath 06/29/19 Conclusions: 1. Multivessel coronary artery disease, including 50% mid LAD, 80% large OM2, 50% mid RCA, and 80-90% ostial rPDA stenoses. 2. Mildly to moderately elevated left and right heart filling pressures. 3. Mildly reduced Fick cardiac output/index. Recommendations: 1. Gentle post-cath hydration for goal net even fluid balance today. Consider gentle diuresis, as renal function tolerates, beginning tomorrow. 2. Ongoing workup for redo aortic valve replacement +/- CABG per Drs. Claiborne Billings and [place]. 3. Initiate heparin infusion 2 hours after TR band removal. 4. Aggressive secondary prevention.  TEE 05/29/19 IMPRESSIONS  1. The left ventricle has moderate-severely reduced systolic function, with an ejection fraction of 30-35%. Left ventricular diffuse hypokinesis. 2. The right ventricle has severely reduced systolic function. The cavity was mildly enlarged. 3. Left atrial size was severely dilated. 4. Right atrial size was severely dilated. 5. The mitral valve is grossly normal. No evidence of mitral valve stenosis. 6. The tricuspid valve  was grossly normal. 7. Aortic valve regurgitation is severe by color flow Doppler. 8. There is evidence of mild plaque in the descending aorta. 9. Moderate to severe global reduction in LV systolic function; mild RVE with severe RV dysfunction; severe biatrial enlargement; s/p mechanical AVR with mean gradient of 14 mmHg (both leaflets appear to be mobile); severe perivalvular AI; mild MR and TR.  Patient  Profile     68 y.o. male with permanent atrial fibrillation, chronic systolic and diastolic heart failure, mechanical AVR in 2013, severe perivalvular AR, hypertension, hyperlipidemia (intolerant of statins) OSA on CPAP, diabetes, CKD 4, and hepatitis  Assessment & Plan    # Chronic systolic and diastolic heart failure: # RV failure: Volume status and renal function are relatively stable.   Metoprolol and digoxin were reduced to shorten the period of diastasis given his severe AR with low diastolic blood pressure.  Continue Entresto.  He has a mild leukocytosis and isn't feeling well.  Renal function slightly worse today.  Will hold his torsemide 20mg  qhs dose in the setting of possible early sepsis and creatinine slightly worse than yesterday.   # s/p mechanical AVR:  # Severe perivalvular AR:  Low diastolic blood pressure.  Metoprolol was reduced to reduce time in diastole and he is feeling better.  Heart rates are in the 50-70s.  DBP remains in the 30-50s with a wide pulse pressure.  Reduced metoprolol and digoxin as above. AVR pending recovery from AKA.  Typically he would follow up with CT surgery after discharge from CIR.    # CAD:  Severe CAD on cath 06/2019.  CABG/AVR has been on hold due to non-healing wound requiring amputations.   # Diabetic foot ulcer:  S/p toe amputation 06/2019 followed by BKA 8/28 followed by AKA 08/2019.  Admitted to CIR.  # CKD: Renal function stable.   # Intermittent confusion:  Likely attributable to low DBP and slow HR.  Reducing metoprolol and  digoxin has helped.   # Hyperlpidemia:  Statin intolerant. Continue Zetia and fenofibrate.  Consider PCSK9 inhibitor as outpatient.   # Persistent atrial fibrillation:  Continue warfarin.  Reduce metoprolol and digoxin as above.       For questions or updates, please contact [place] Please consult [email or web address] for contact info under        Signed, Skeet Latch, MD  08/26/2019, 11:27 AM

## 2019-08-26 NOTE — Progress Notes (Signed)
Physical Therapy Session Note  Patient Details  Name: Joseph Hoffman MRN: [id number] Date of Birth: Nov 23, 1951  Today's Date: 08/26/2019 PT Individual Time: 1000-1010 PT Individual Time Calculation (min): 10 min  and Today's Date: 08/26/2019 PT Missed Time: 50 Minutes Missed Time Reason: Patient fatigue;Patient ill (Comment)  Short Term Goals: Week 2:  PT Short Term Goal 1 (Week 2): Patient will perform bed mobility with supervision. PT Short Term Goal 2 (Week 2): Patient will perform sit<>stand with min A using RW. PT Short Term Goal 3 (Week 2): Patient will perform basic transfers with min A. PT Short Term Goal 4 (Week 2): Patient will initiate ambulation with RW.  Skilled Therapeutic Interventions/Progress Updates:    Pt supine in bed upon PT arrival, pt immediately reports "I do not feel well at all, I am cold." Therapist suggested doing bed level exercises and provided encouragement for participation, pt continues to decline reporting that he does not feel well at all. Pt reported he thinks he may have had a bowel movement. Pt performed rolling in bed with min assist and cues for use of bedrails, therapist performed pericare total assist. Educated on positioning in bed and rolling for pressure relief to prevent skin breakdown. Pt left supine in bed with needs in reach at end of session, missed 50 minutes secondary to fatigue and illness.   Therapy Documentation Precautions:  Precautions Precautions: Fall Precaution Comments: monitor HR, sats and BP Restrictions Weight Bearing Restrictions: Yes LLE Weight Bearing: Non weight bearing Other Position/Activity Restrictions: L AKA - in ace wrap.  no drainage seen through wrap    Therapy/Group: Individual Therapy  Netta Corrigan, PT, DPT 08/26/2019, 7:59 AM

## 2019-08-27 ENCOUNTER — Inpatient Hospital Stay (HOSPITAL_COMMUNITY): Payer: Medicare Other

## 2019-08-27 ENCOUNTER — Inpatient Hospital Stay (HOSPITAL_COMMUNITY): Payer: Medicare Other | Admitting: Rehabilitation

## 2019-08-27 ENCOUNTER — Inpatient Hospital Stay (HOSPITAL_COMMUNITY): Payer: Medicare Other | Admitting: *Deleted

## 2019-08-27 ENCOUNTER — Inpatient Hospital Stay (HOSPITAL_COMMUNITY): Payer: Medicare Other | Admitting: Occupational Therapy

## 2019-08-27 DIAGNOSIS — I4821 Permanent atrial fibrillation: Secondary | ICD-10-CM

## 2019-08-27 LAB — CBC WITH DIFFERENTIAL/PLATELET
Abs Immature Granulocytes: 0.17 10*3/uL — ABNORMAL HIGH (ref 0.00–0.07)
Basophils Absolute: 0.1 10*3/uL (ref 0.0–0.1)
Basophils Relative: 1 %
Eosinophils Absolute: 0.6 10*3/uL — ABNORMAL HIGH (ref 0.0–0.5)
Eosinophils Relative: 6 %
HCT: 29.1 % — ABNORMAL LOW (ref 39.0–52.0)
Hemoglobin: 9.3 g/dL — ABNORMAL LOW (ref 13.0–17.0)
Immature Granulocytes: 2 %
Lymphocytes Relative: 9 %
Lymphs Abs: 0.9 10*3/uL (ref 0.7–4.0)
MCH: 26.7 pg (ref 26.0–34.0)
MCHC: 32 g/dL (ref 30.0–36.0)
MCV: 83.6 fL (ref 80.0–100.0)
Monocytes Absolute: 0.9 10*3/uL (ref 0.1–1.0)
Monocytes Relative: 9 %
Neutro Abs: 7.2 10*3/uL (ref 1.7–7.7)
Neutrophils Relative %: 73 %
Platelets: 378 10*3/uL (ref 150–400)
RBC: 3.48 MIL/uL — ABNORMAL LOW (ref 4.22–5.81)
RDW: 17.2 % — ABNORMAL HIGH (ref 11.5–15.5)
WBC: 9.8 10*3/uL (ref 4.0–10.5)
nRBC: 0 % (ref 0.0–0.2)

## 2019-08-27 LAB — GLUCOSE, CAPILLARY
Glucose-Capillary: 242 mg/dL — ABNORMAL HIGH (ref 70–99)
Glucose-Capillary: 231 mg/dL — ABNORMAL HIGH (ref 70–99)
Glucose-Capillary: 183 mg/dL — ABNORMAL HIGH (ref 70–99)
Glucose-Capillary: 318 mg/dL — ABNORMAL HIGH (ref 70–99)
Glucose-Capillary: 152 mg/dL — ABNORMAL HIGH (ref 70–99)

## 2019-08-27 LAB — COMPREHENSIVE METABOLIC PANEL
ALT: 36 U/L (ref 0–44)
AST: 38 U/L (ref 15–41)
Albumin: 3 g/dL — ABNORMAL LOW (ref 3.5–5.0)
Alkaline Phosphatase: 45 U/L (ref 38–126)
Anion gap: 12 (ref 5–15)
BUN: 57 mg/dL — ABNORMAL HIGH (ref 8–23)
CO2: 25 mmol/L (ref 22–32)
Calcium: 9.6 mg/dL (ref 8.9–10.3)
Chloride: 101 mmol/L (ref 98–111)
Creatinine, Ser: 1.62 mg/dL — ABNORMAL HIGH (ref 0.61–1.24)
GFR calc Af Amer: 49 mL/min — ABNORMAL LOW (ref >60)
GFR calc non Af Amer: 42 mL/min — ABNORMAL LOW (ref >60)
Glucose, Bld: 188 mg/dL — ABNORMAL HIGH (ref 70–99)
Potassium: 4.3 mmol/L (ref 3.5–5.1)
Sodium: 138 mmol/L (ref 135–145)
Total Bilirubin: 0.6 mg/dL (ref 0.3–1.2)
Total Protein: 6.7 g/dL (ref 6.5–8.1)

## 2019-08-27 LAB — PROTIME-INR
INR: 3.3 — ABNORMAL HIGH (ref 0.8–1.2)
Prothrombin Time: 33 seconds — ABNORMAL HIGH (ref 11.4–15.2)

## 2019-08-27 LAB — TROPONIN I (HIGH SENSITIVITY)
Troponin I (High Sensitivity): 29 ng/L — ABNORMAL HIGH (ref <18)
Troponin I (High Sensitivity): 31 ng/L — ABNORMAL HIGH (ref <18)

## 2019-08-27 LAB — URINE CULTURE: Culture: <10000 — AB

## 2019-08-27 MED ORDER — NITROGLYCERIN 0.4 MG SL SUBL
SUBLINGUAL_TABLET | SUBLINGUAL | Status: AC
  Administered 2019-08-27: 0.4 mg
  Filled 2019-08-27: qty 1

## 2019-08-27 MED ORDER — WARFARIN SODIUM 7.5 MG PO TABS
7.5000 mg | ORAL_TABLET | Freq: Once | ORAL | Status: AC
  Administered 2019-08-27: 7.5 mg via ORAL
  Filled 2019-08-27: qty 1

## 2019-08-27 MED ORDER — NITROGLYCERIN 0.4 MG SL SUBL
0.4000 mg | SUBLINGUAL_TABLET | SUBLINGUAL | Status: DC | PRN
  Administered 2019-09-11: 13:00:00 0.4 mg via SUBLINGUAL
  Filled 2019-08-27: qty 1

## 2019-08-27 NOTE — Progress Notes (Signed)
Physical Therapy Session Note  Patient Details  Name: Joseph Hoffman MRN: [id number] Date of Birth: 10-06-1951  Today's Date: 08/27/2019 PT Missed Time: 60 Minutes Missed Time Reason: Unavailable (Comment);MD hold (Comment)(gone for procedure, on hold per MD ordres)  Short Term Goals: Week 1:  PT Short Term Goal 1 (Week 1): Patient will perform bed mobility with min A. PT Short Term Goal 1 - Progress (Week 1): Met PT Short Term Goal 2 (Week 1): Pt will perform bed<>chair transfer w/ mod assist +2 for safety. PT Short Term Goal 2 - Progress (Week 1): Met PT Short Term Goal 3 (Week 1): Pt will tolerate sitting OOB in between therapy sessions for 1 hour. PT Short Term Goal 3 - Progress (Week 1): Met PT Short Term Goal 4 (Week 1): Pt will self-propel w/c 50' w/ supervision PT Short Term Goal 4 - Progress (Week 1): Met PT Short Term Goal 5 (Week 1): Patient will initiate standing with mod A +2 using LRAD. PT Short Term Goal 5 - Progress (Week 1): Met Week 2:  PT Short Term Goal 1 (Week 2): Patient will perform bed mobility with supervision. PT Short Term Goal 2 (Week 2): Patient will perform sit<>stand with min A using RW. PT Short Term Goal 3 (Week 2): Patient will perform basic transfers with min A. PT Short Term Goal 4 (Week 2): Patient will initiate ambulation with RW.  Skilled Therapeutic Interventions/Progress Updates:   Pt out of room getting procedure, also note that he was placed on hold per PA notes due to chest pain in previous session.    Therapy Documentation Precautions:  Precautions Precautions: Fall Precaution Comments: monitor HR, sats and BP Restrictions Weight Bearing Restrictions: Yes LLE Weight Bearing: Non weight bearing Other Position/Activity Restrictions: L AKA - in ace wrap.  no drainage seen through wrap General: PT Amount of Missed Time (min): 60 Minutes PT Missed Treatment Reason: Unavailable (Comment);MD hold (Comment)(gone for procedure, on hold per MD  ordres)     Therapy/Group: Individual Therapy  Denice Bors 08/27/2019, 11:42 AM

## 2019-08-27 NOTE — Progress Notes (Signed)
ANTICOAGULATION CONSULT NOTE - Follow Up Consult  Pharmacy Consult for warfarin Indication: Mechanical valve  Patient Measurements: Height: 5\' 11"  (180.3 cm) Weight: 218 lb 11.1 oz (99.2 kg) IBW/kg (Calculated) : 75.3  Vital Signs: Temp: 99.1 F (37.3 C) (09/24 0527) Temp Source: Oral (09/24 0527) BP: 126/50 (09/24 0527) Pulse Rate: 72 (09/24 0527)  Labs: Recent Labs    08/25/19 0506 08/26/19 0734 08/26/19 1014 08/27/19 0536  HGB 8.4*  --  8.5* 9.3*  HCT 28.0*  --  25.9* 29.1*  PLT 374  --  361 378  LABPROT 29.7* 31.5*  --  33.0*  INR 2.9* 3.1*  --  3.3*  CREATININE  --   --  1.69* 1.62*    Estimated Creatinine Clearance: 51 mL/min (A) (by C-G formula based on SCr of 1.62 mg/dL (H)).  Assessment: 68 year old male on warfarin for mechanical AVR who received INR reversal (Vitamin K 5mg  IV) on 9/9 for an AKA. Patient is on IV Heparin per pharmacy dosing consult and Warfarin was resumed per pharmacy dosing on 9/12. PTA warfarin dose - 10 mg daily except 12.5mg  TTS, admit INR 2.7 (goal 2.5-3.5)  INR remains therapeutic. Heparin d/c'd on 9/21. CBC is stable, and no bleeding issues documented.   Goal of Therapy:  INR 2.5-3.5 Monitor platelets by anticoagulation protocol: Yes   Plan:  - Warfarin 7.5mg  x 1 at 1800  - Monitor daily INR, CBC, s/sx bleeding  Joseph Hoffman, PharmD, BCPS Please check AMION for all [place] contact numbers Clinical Pharmacist 08/27/2019 9:44 AM

## 2019-08-27 NOTE — Progress Notes (Signed)
Occupational Therapy Session Note  Patient Details  Name: Joseph Hoffman MRN: [id number] Date of Birth: 04-22-1951  Today's Date: 08/27/2019 OT Individual Time: 1350-1500 OT Individual Time Calculation (min): 70 min    Short Term Goals: Week 2:  OT Short Term Goal 1 (Week 2): Pt will be able to transfer to [facility] and or toilet with min A using LRAD. OT Short Term Goal 2 (Week 2): Pt will be able to bathe LB with S OT Short Term Goal 3 (Week 2): Pt will be able to dress LB with min A at sit > stand level OT Short Term Goal 4 (Week 2): Pt will be able to maintain static stand for 1 minute with min A to pull clothing over hips.  Skilled Therapeutic Interventions/Progress Updates:    Treatment session with focus on bed mobility and lateral leans to engage in self-care tasks.  Bedside therapy orders noted per PA, therefore engaged in all self-care tasks from bed level progressing to EOB.  Pt reports feeling much better this afternoon, but never sure due to his heart issues.  Pt donned underwear and shorts at bed level, sitting to EOB to pull shorts over hips with lateral leans.  Pt completed bed mobility and lateral leans with close supervision.  Engaged in RLE activities seated EOB with focus on hip mobility and desensitization and massage.  Engaged in discussion regarding pt's goals and his realization that he and his wife have different goals and expectations of CIR.  Discussed current OT and PT goals as well as progress towards goals as of weekly update.  Pt pleased with progress and accepting of modifications to activities as per order as well as medical stability.  Pt returned to semi-reclined in bed and left with all needs in reach.  Therapy Documentation Precautions:  Precautions Precautions: Fall Precaution Comments: monitor HR, sats and BP Restrictions Weight Bearing Restrictions: Yes LLE Weight Bearing: Non weight bearing Other Position/Activity Restrictions: L AKA - in ace wrap.  no  drainage seen through wrap General: General PT Missed Treatment Reason: Unavailable (Comment);MD hold (Comment)(gone for procedure, on hold per MD ordres) Vital Signs: Therapy Vitals Temp: 98.1 F (36.7 C) Temp Source: Oral Pulse Rate: 61 Resp: 18 BP: (!) 125/49 Patient Position (if appropriate): Lying Oxygen Therapy SpO2: 98 % O2 Device: Room Air Pain:  Pt with no c/o pain   Therapy/Group: Individual Therapy  Simonne Come 08/27/2019, 3:13 PM

## 2019-08-27 NOTE — Progress Notes (Signed)
Patient developed chest pain during therapy. Left chest--no radiation, no nausea or increase in  diaphoresis. has been sweaty with therapy. Unable to differentiate cardiac v/s chest wall. He did state that when he had it before he receive a nitro. Nitro given with improvement.  He did have another twinge while in room but on exam appears MS--reports has some of this with activity. EKG and cardiac enzymes ordered. Will order CXR question overload as weight trending up in past 2 days and may be contributing to symptoms. To hold therapy this am and try bedside activity this afternoon--may need to spread out/pace therapy due to CAD/deconditioning.

## 2019-08-27 NOTE — Progress Notes (Signed)
Notified RT for neb treatment for increase congestion and  coughing irritation, secretion is very thick and copious , treatment tolerated well ,RT placed patient back on CPAP Oxygen sats 93% Resting comfortable., monitor

## 2019-08-27 NOTE — Progress Notes (Signed)
Patient c/o chest pain muscular in nature; pain med given. [name] PA aware of lab results.

## 2019-08-27 NOTE — Progress Notes (Signed)
Physical Therapy Session Note  Patient Details  Name: Joseph Hoffman MRN: [id number] Date of Birth: Jul 30, 1951  Today's Date: 08/27/2019 PT Individual Time: 0905-1000 PT Individual Time Calculation (min): 55 min   Short Term Goals:  Week 2:  PT Short Term Goal 1 (Week 2): Patient will perform bed mobility with supervision. PT Short Term Goal 2 (Week 2): Patient will perform sit<>stand with min A using RW. PT Short Term Goal 3 (Week 2): Patient will perform basic transfers with min A. PT Short Term Goal 4 (Week 2): Patient will initiate ambulation with RW.  Skilled Therapeutic Interventions/Progress Updates:  Pt resting in bed.  He stated that he felt much better than yesterday.  He rated pain 8/10 L residual limb; prmedicated.  Pt doffed gown, donned boxers and shorts in bed with supervision. Supine> sit using bed features, with supervision.  Pt reported feeling dizzy immediately.  This lessened slowly over 3 minutes.    Slide board transfer bed> w/c slightly downhill, CGA.  W/c propulsion using bil UEs and RLE x 50' before fatigued.  BP in sitting after rest = 139/43.  Pt really wanted to try standing.  In parallel bars, sit> stand iwht min assist.  Pt tolerated standing x 1 minute. BP in sitting after standing: 120/36, HR 59.  Therapeutic exercise performed with LE to increase strength for functional mobility: in standing- L 10 x 1 hip extension.  Seated 15 x 1 bil hip adduction, R long arc quad knee estension with ankle pumps at end range, R hip flexion.  Pt continued to have dizziness in sitting.  Pt began having chest pain in sitting in the wc.  L chest pain, sharp.  PT took pt back to room, informed Marjorie Smolder, Therapist, sports.  Pt transferred back to bed squat pivot with mod assist.  Sit> lying iwht mod assist.  Pt left resting in bed with alarm set, needs at hand and NT entering room to take vitals.     Therapy Documentation Precautions:  Precautions Precautions: Fall Precaution  Comments: monitor HR, sats and BP Restrictions Weight Bearing Restrictions: Yes LLE Weight Bearing: Non weight bearing Other Position/Activity Restrictions: L AKA - in ace wrap.  no drainage seen through wrap General: PT Amount of Missed Time (min): 60 Minutes PT Missed Treatment Reason: Unavailable (Comment);MD hold (Comment)(gone for procedure, on hold per MD ordres)       Therapy/Group: Individual Therapy  Jerred Zaremba 08/27/2019, 12:59 PM

## 2019-08-27 NOTE — Evaluation (Signed)
Recreational Therapy Assessment and Plan  Patient Details  Name: Joseph Hoffman MRN: [id number] Date of Birth: [birth year] Today's Date: 08/27/2019 Time:  1300-1355 Pain:  No c/o, pt stated he felt much better than this morning.  Rehab Potential: Good ELOS: 2 weeks   Assessment Problem List:      Patient Active Problem List   Diagnosis Date Noted  . Above knee amputation of left lower extremity ([place]) 08/17/2019  . Nonhealing surgical wound 08/13/2019  . Non-healing amputation site ([place]) 08/13/2019  . Type 2 diabetes mellitus with peripheral neuropathy (HCC)   . Chronic combined systolic and diastolic CHF (congestive heart failure) ([place])   . Myalgia   . Urinary retention   . Insomnia due to medical condition   . Drug-induced constipation   . Anticoagulated on Coumadin   . Unilateral complete BKA, left, initial encounter ([place]) 08/05/2019  . Leukocytosis   . Acute on chronic anemia   . Post-operative pain   . Nausea & vomiting 07/29/2019  . Diarrhea 07/29/2019  . Diabetic ulcer of left foot associated with diabetes mellitus due to underlying condition ([place]) 07/29/2019  . Left ventricular ejection fraction of 30% to 35% 07/29/2019  . PAD (peripheral artery disease) ([place]) 07/28/2019  . Long term (current) use of anticoagulants 07/13/2019  . CAD, multiple vessel 07/10/2019  . Aortic valve regurgitation   . Aortic valve disease   . Foot ulcer, left ([place]) 06/26/2019  . Depression 05/28/2019  . Congestive heart failure (CHF) ([place]) 05/13/2019  . H/O heart valve replacement with mechanical valve   . Type II diabetes mellitus with renal manifestations ([place]) 05/12/2019  . Chest pain 05/12/2019  . Elevated troponin 05/12/2019  . Stage 3 chronic kidney disease ([place]) 05/12/2019  . Acute on chronic systolic (congestive) heart failure ([place]) 05/12/2019  . CRI (chronic renal insufficiency), stage 3 (moderate) ([place]) 04/09/2019  . Acute combined systolic and diastolic heart  failure ([place]) 04/09/2019  . Essential hypertension 06/03/2017  . Dyslipidemia, goal LDL below 70 04/11/2017  . AS (aortic stenosis) 10/28/2012  . Acute on chronic systolic heart failure, re-admitted 10/12/12 10/12/2012  . S/P AVR, 09/29/12, [employer]. (discharged 10/05/12) 09/30/2012  . Permanent atrial fibrillation, since 1994 09/30/2012  . Type 2 IDDM 09/30/2012  . OSA on CPAP 09/30/2012  . Normal coronary arteries at cath Oct 2013 09/30/2012  . Chronic anticoagulation 09/30/2012  . Severe aortic stenosis 09/25/2012    Past Medical History:      Past Medical History:  Diagnosis Date  . Anxiety   . Aortic stenosis   . Arthritis   . Asthma   . Asymptomatic LV dysfunction, EF 45%, normal coronary arteries on cardiac cath 09/18/12 09/30/2012  . CHF (congestive heart failure) ([place])   . Chronic anticoagulation, on Xarelto prior to admit 09/30/2012  . DM (diabetes mellitus) ([place]) 09/30/2012  . Hepatitis B 2003  . Hypertension   . Myocardial infarction ([place])   . Permanent atrial fibrillation, since 1994 09/30/2012   stress test 02/08/12- normal study, no significant ischemia  . S/P AVR (aortic valve replacement), 09/30/2012   a. s/p mechcanical AVR in 09/2012 (on Coumadin)  . Sleep apnea    uses CPAP   Past Surgical History:       Past Surgical History:  Procedure Laterality Date  . ABDOMINAL ANGIOGRAM  09/18/2012   Procedure: ABDOMINAL ANGIOGRAM;  Surgeon: Troy Sine, MD;  Location[facility] CATH LAB;  Service: Cardiovascular;;  . ABDOMINAL AORTOGRAM W/LOWER EXTREMITY N/A 07/02/2019  Procedure: ABDOMINAL AORTOGRAM W/LOWER EXTREMITY;  Surgeon: Marty Heck, MD;  Location: [place] CV LAB;  Service: Cardiovascular;  Laterality: N/A;  . AMPUTATION Left 07/03/2019   Procedure: AMPUTATION LEFT GREAT TOE;  Surgeon: Marty Heck, MD;  Location: [place];  Service: Vascular;  Laterality: Left;  . AMPUTATION Left 07/31/2019   Procedure: AMPUTATION BELOW KNEE  LEFT;  Surgeon: Marty Heck, MD;  Location: [place];  Service: Vascular;  Laterality: Left;  . AMPUTATION Left 08/13/2019   Procedure: LEFT AMPUTATION ABOVE KNEE;  Surgeon: Marty Heck, MD;  Location: [place];  Service: Vascular;  Laterality: Left;  . AORTIC VALVE REPLACEMENT  09/29/2012   Procedure: AORTIC VALVE REPLACEMENT (AVR);  Surgeon: Gaye Pollack, MD;  Location: [place];  Service: Open Heart Surgery;  Laterality: N/A;  . ARCH AORTOGRAM  09/18/2012   Procedure: ARCH AORTOGRAM;  Surgeon: Troy Sine, MD;  Location: [facility] CATH LAB;  Service: Cardiovascular;;  . CARDIAC CATHETERIZATION  09/18/12   severe calcific aortic stenosis, peak gradient 3m, mean gradient 548m EF 45%  . CARDIAC VALVE REPLACEMENT     AVR 09-29-12  . CHOLECYSTECTOMY    . FRACTURE SURGERY    . LEFT AND RIGHT HEART CATHETERIZATION WITH CORONARY ANGIOGRAM N/A 09/18/2012   Procedure: LEFT AND RIGHT HEART CATHETERIZATION WITH CORONARY ANGIOGRAM;  Surgeon: ThTroy SineMD;  [facility];  Service: Cardiovascular;  Laterality: N/A;  . PERIPHERAL VASCULAR INTERVENTION Left 07/02/2019   Procedure: PERIPHERAL VASCULAR INTERVENTION;  Surgeon: ClMarty HeckMD;  [facility];  Service: Cardiovascular;  Laterality: Left;  . RIGHT HEART CATH AND CORONARY ANGIOGRAPHY N/A 06/29/2019   Procedure: RIGHT HEART CATH AND CORONARY ANGIOGRAPHY;  Surgeon: EnNelva BushMD;  [facility] [place];  Service: Cardiovascular;  Laterality: N/A;  . TEE WITHOUT CARDIOVERSION N/A 05/29/2019   Procedure: TRANSESOPHAGEAL ECHOCARDIOGRAM (TEE);  Surgeon: CrLelon PerlaMD;  [facility];  Service: Cardiovascular;  Laterality: N/A;    Assessment & Plan Clinical Impression: GaDAJAUN GOLDRINGs a [age]ear old male with history ofT2DM, CAD, CAF, mechanical AVR- on coumadin,OSA-CPAP with 2 L oxygen at nights, left great toe amputation 07/03/2019 who was admitted with  nausea vomiting, poor wound healing and purulent drainage.He was started on IV antibiotics and underwent left BKA on 07/31/2019 by Dr. ClCarlis AbbottHospital course complicated by hypotension with AKI, urinary retention, lethargy with bouts of confusion felt to be due to narcotics as well as subtherapeutic INR requiring heparin/Coumadin bridge. Therapy evaluations done revealing functional deficits and CIR was recommended for follow-up therapy. He was admitted for comprehensive inpatient rehab on 08/05/2019.   He was maintained on IV heparin untilINRgreater than 2.5. Pain control was reasonable with prn use ofoxycodone and Robaxin. Foley was discontinued past admission and he was started on bladder program. He continued to have problems with urinary retention requiring in and out caths at least 4-5 times a day and wasplaced on 1500 cc fluid restriction due tohighPVRs.Laxatives were added to help with OIC. He was noted to develop increasing pain with edema of amputation site on 09/09. Wound showed signs of ischemiaand Dr. ClCarlis Abbottas consulted for input. Surgical intervention recommended and patient was discharged to acute hospital for revision of wound to left AKA on zero 9/10 a.m. Postop pain control is improving.He continues to have serosanguineous drainage from amputation site and currently on IV heparin/Coumadin bridge as INR subtherapeutic. Follow-up CBC shows H&H to be relatively stable.  Therapy was resumed and patient continues to have limitations in mobility and ADLs. He was cleared to resume CIR program on 09/14. Patient transferred to CIR on 08/17/2019.    Pt presents with decreased activity tolerance, decreased functional mobility, decreased balance, feelings of stress and anxiety Limiting pt's independence with leisure/community pursuits.   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 i fly off the handle Social interaction - Mood/Behavior: Cooperative Engineer, drilling for Education?: Yes Strengths/Weaknesses Patient Strengths/Abilities: Willingness to participate Patient weaknesses: Physical limitations TR Patient demonstrates impairments in the following area(s): Edema;Endurance;Motor;Pain;Skin Integrity;Safety TR Additional Impairment(s): Leisure Awareness  Plan Rec Therapy Plan Is patient appropriate for Therapeutic Recreation?: Yes Rehab Potential: Good Treatment times per week: Min 1 TR session >20 minutes during LOS Estimated Length of Stay: 2 weeks TR Treatment/Interventions: Adaptive equipment instruction;1:1 session;Balance/vestibular training;Leisure education;Functional mobility training;Community reintegration;Patient/family education;Recreation/leisure participation;Therapeutic activities;Therapeutic exercise(relaxation training) Recommendations for other services: Neuropsych  Recommendations for other services: Neuropsych  Discharge Criteria: Patient will be discharged from TR if patient refuses treatment 3 consecutive times without medical reason.  If treatment goals not met, if there is a change in medical status, if patient makes no progress towards goals or if patient is discharged from hospital.  The above assessment, treatment plan, treatment alternatives and goals were discussed and mutually agreed upon: by patient   *Session note:  Session focused on discussing recognizing signs of stress, what stress looks like for him personally, coping strategies including the introduction of diaphragmatic breathing and writing down progress in his notebook as pt states " I can't get off of  start, things just seem to keep happening and I can't move forward."  Reviewed pts goals, progress and current status through progress notes which encouraged pt.  Suggested that pt write down tasks that demonstrated his progress thus far in rehab including medical and therapy.  Pt appreciative of this visit and suggestions and plans to implement.  [place] 08/27/2019, 4:04 PM

## 2019-08-27 NOTE — Progress Notes (Signed)
[facility] PHYSICAL MEDICINE & REHABILITATION PROGRESS NOTE   Subjective/Complaints:   Pt reports feeling MUCH better today- feels more like normal self- denies any issues- can't wait to do therapy this AM. LBM this AM- was "great BM".  Per note overnight , has copious secretions last night- that were thick, however lungs sound clear and WBC is down from 11.4k to 9.8k.  ROS: Denies CP, SOB, N/V/D  Objective:   No results found. Recent Labs    08/26/19 1014 08/27/19 0536  WBC 11.4* 9.8  HGB 8.5* 9.3*  HCT 25.9* 29.1*  PLT 361 378   Recent Labs    08/26/19 1014 08/27/19 0536  NA 133* 138  K 4.6 4.3  CL 100 101  CO2 23 25  GLUCOSE 236* 188*  BUN 68* 57*  CREATININE 1.69* 1.62*  CALCIUM 9.3 9.6    Intake/Output Summary (Last 24 hours) at 08/27/2019 0917 Last data filed at 08/27/2019 0842 Gross per 24 hour  Intake 1140 ml  Output 3451 ml  Net -2311 ml     Physical Exam: Vital Signs Blood pressure (!) 126/50, pulse 72, temperature 99.1 F (37.3 C), temperature source Oral, resp. rate 16, height 5\' 11"  (1.803 m), weight 99.2 kg, SpO2 98 %. Pt lying in bed; poor color; supine; more nasal, but no other signs of URI, no acute distress HENT: Normocephalic.  Atraumatic. Eyes: EOMI. No discharge. Cardiovascular: RRR has click Respiratory: CTA B/L- no wheezes, rales, or rhonchi and good air movement B/L GI: protuberant, NT, ND, Soft, (+) hypoactive BS Skin: Left AKA with dressing C/D/I- per nurse, looks great Psych: appears sedated/tired/malaise Musc: Left AKA with edema and tenderness Neurological: Alert Motor: Bilateral upper extremities: 5/5 proximal distal Right lower extremity: 4/5 proximal distal, stable Left lower extremity: 4+/5 (pain inhibition), stable   Assessment/Plan: 1. Functional deficits secondary to new L AKA due to ischemia/gangrene which require 3+ hours per day of interdisciplinary therapy in a comprehensive inpatient rehab  setting.  Physiatrist is providing close team supervision and 24 hour management of active medical problems listed below.  Physiatrist and rehab team continue to assess barriers to discharge/monitor patient progress toward functional and medical goals  Care Tool:  Bathing    Body parts bathed by patient: Right arm, Left arm, Chest, Abdomen, Front perineal area, Right upper leg, Left upper leg, Face, Right lower leg, Buttocks   Body parts bathed by helper: Buttocks Body parts n/a: Left lower leg   Bathing assist Assist Level: Minimal Assistance - Patient > 75%     Upper Body Dressing/Undressing Upper body dressing   What is the patient wearing?: Pull over shirt    Upper body assist Assist Level: Set up assist    Lower Body Dressing/Undressing Lower body dressing      What is the patient wearing?: Underwear/pull up, Pants     Lower body assist Assist for lower body dressing: Minimal Assistance - Patient > 75%     Toileting Toileting    Toileting assist Assist for toileting: Maximal Assistance - Patient 25 - 49% Assistive Device Comment: bedpan   Transfers Chair/bed transfer  Transfers assist  Chair/bed transfer activity did not occur: Safety/medical concerns  Chair/bed transfer assist level: Moderate Assistance - Patient 50 - 74% Chair/bed transfer assistive device: Armrests   Locomotion Ambulation   Ambulation assist   Ambulation activity did not occur: Safety/medical concerns(labial BP and HR sitting EOB.)  Assist level: Minimal Assistance - Patient > 75% Assistive device: Parallel bars Max distance:  6'   Walk 10 feet activity   Assist  Walk 10 feet activity did not occur: Safety/medical concerns(labial BP and HR sitting EOB.)        Walk 50 feet activity   Assist Walk 50 feet with 2 turns activity did not occur: Safety/medical concerns(labial BP and HR sitting EOB.)         Walk 150 feet activity   Assist Walk 150 feet activity did  not occur: Safety/medical concerns(labial BP and HR sitting EOB.)         Walk 10 feet on uneven surface  activity   Assist Walk 10 feet on uneven surfaces activity did not occur: Safety/medical concerns(labial BP and HR sitting EOB.)         Wheelchair     Assist Will patient use wheelchair at discharge?: Yes Type of Wheelchair: Manual Wheelchair activity did not occur: Safety/medical concerns(labial BP and HR sitting EOB.)  Wheelchair assist level: Set up assist, Supervision/Verbal cueing Max wheelchair distance: 150'    Wheelchair 50 feet with 2 turns activity    Assist    Wheelchair 50 feet with 2 turns activity did not occur: Safety/medical concerns(labial BP and HR sitting EOB.)   Assist Level: Set up assist, Supervision/Verbal cueing   Wheelchair 150 feet activity     Assist  Wheelchair 150 feet activity did not occur: Safety/medical concerns(labial BP and HR sitting EOB.)   Assist Level: Set up assist, Supervision/Verbal cueing   Blood pressure (!) 126/50, pulse 72, temperature 99.1 F (37.3 C), temperature source Oral, resp. rate 16, height 5\' 11"  (1.803 m), weight 99.2 kg, SpO2 98 %.  Medical Problem List and Plan: 1.Fxnl and mobility deficitssecondary to PAD causing left AKA  Cont CIR 2.Mechanical AVR/Antithrombotics: -DVT/anticoagulation:Pharmaceutical:Coumadin and heparin GGT INR> 2.5.  Unfractionated heparin within normal range on 9/20   INR 2.4 on 9/20   9/21- INR 2.6- pharmacy stopped Heparin gtt- will con't coumadin mgmt  9/22- INR 2.9  9/23- INR 3.1- per pharmacy  9/24- INR 3.3- per pharmacy  -antiplatelet therapy: N/A 3. Pain Management:  Scheduled low dose flexeril tid to help with spasms.  May need neurontin titrate upwarrds. Scheduled tramadol 50 mg TID and will use oxy only when needs it.  Appears relatively controlled with medication on 9/20  Encouraged desensitization techniques 4.  Mood:LCSW to follow for evaluation and support. -antipsychotic agents: N/A 5. Neuropsych: This patientiscapable of making decisions on hisown behalf. 6. Skin/Wound Care:Monitor wound daily. Monitor stump for signs and symptoms of infection  Drainage continues to improve, decrease dressing changes to daily 7. Fluids/Electrolytes/Nutrition:Monitor I/Os 8. T2DM: Monitor BS ac/hs. Continue Lantus 40 mg bid with Victoza  Resumed amaryl 2mg    9/15- increase Lantus to 44 units BID CBG (last 3)  Recent Labs    08/25/19 2125 08/26/19 0623 08/26/19 1140  GLUCAP 205* 202* 201*   Labile on 9/20, monitor for trend  9/21- BGs much better controlled  9/24- running low 200s- will try to increase lantus slightly. 9. CAD/CAF: Monitor for presyncope/syncope as back on Entresto.   Appreciate cards recs, metoprolol decreased  Dig level low on 9/18  Pulse pressure remains wide on 9/20  Heart rate improving on 9/20  9/21- Cards- decr Metoprolol and Digoxin- pt feeling much better  10 Chronic systolic CHF: Heart healthy diet and monitor for signs of overload. Check weight daily. On Entresto, Demadex, metoprolol and fenofibrate, Vascepa.    Filed Weights   08/25/19 0304 08/26/19 0403 08/27/19 0527  Weight:  112.2 kg 112.2 kg 99.2 kg   ? Reliability on 9/20  9/23- Weight stable since yesterday- not sure if previous weight accurate 11. Urinary retention:   PVRs elevated  Continue Flomax  9/23- voiding "all night" per pt- which was new- didn't require cath o/n 12. Constipation:Start bowel program as this has been an issues since first surgery.   9/23- LBM o/n 13.  Acute blood loss anemia  Transfused 1 unit PRBC on 9/16  Hemoglobin 8.6 on 9/20  Continue to monitor 13. Hyponatremia:   Sodium 135 on 9/17, labs ordered for tomorrow  9/23- Na 133  Continue to monitor 14. OSA: CPAP when sleeping with 2 L bleed in.  15. Acute on chronic renal failure:   Creatinine 1.66 on  9/17, labs ordered for tomorrow  Encourage fluids  9/21- Cr down to 1.4  9/23- Cr up to 1.69 and BUN up to 68- is dry, Cards held QHS torsemide  9/24 Cr 1.62 and BUN down to 57 after Torsemide held  Continue to monitor 16.  Transaminitis  LFTs elevated on 9/17, labs ordered for tomorrow  9/21- BMP was ordered- will recheck [facility] Thursday  9/24- ALT/AST slightly elevated in 40s  Continue to monitor 17. Lethargy/malaise  9/23- U/A (-) for UTI- CBC shows mild leukocytosis; of 11.4k can't just start ABX, unsure as to why- AKA looks OK, per nursing; might be cold/URI (is more nasal); could be dehydration since Cr up and BUN significantly up. Will check labs in AM and monitor more closely.  9/24- resolved today.   LOS: 10 days A FACE TO FACE EVALUATION WAS PERFORMED  Joseph Hoffman 08/27/2019, 9:17 AM

## 2019-08-27 NOTE — Progress Notes (Signed)
Progress Note  Patient Name: Joseph Hoffman Date of Encounter: 08/27/2019  Primary Cardiologist: Shelva Majestic, MD  CT Surgeon: [name]  Subjective   Feeling better. He had an episode of chest pain earlier today when working with PT. Per the team it was felt to be musculoskeletal.  He felt better with nitroglycerin.    Inpatient Medications    Scheduled Meds: . B-complex with vitamin C  1 tablet Oral Daily  . bethanechol  10 mg Oral QID  . cholecalciferol  1,000 Units Oral Q breakfast  . cyclobenzaprine  5 mg Oral TID  . digoxin  0.0625 mg Oral Daily  . docusate sodium  100 mg Oral BID  . escitalopram  10 mg Oral QHS  . ezetimibe  10 mg Oral Daily  . feeding supplement (PRO-STAT SUGAR FREE 64)  30 mL Oral BID  . fenofibrate  160 mg Oral Q breakfast  . glimepiride  2 mg Oral Q breakfast  . hydrocortisone cream   Topical TID  . Icosapent Ethyl  2 g Oral BID  . insulin aspart  0-20 Units Subcutaneous TID WC  . insulin aspart  0-5 Units Subcutaneous QHS  . insulin glargine  44 Units Subcutaneous BID  . liraglutide  1.8 mg Subcutaneous QHS  . metoprolol succinate  12.5 mg Oral Daily  . milk and molasses  1 enema Rectal Once  . mometasone-formoterol  2 puff Inhalation BID  . pantoprazole  40 mg Oral Daily  . polyethylene glycol  17 g Oral BID  . Ensure Max Protein  11 oz Oral Daily  . saccharomyces boulardii  250 mg Oral 3 times weekly  . sacubitril-valsartan  1 tablet Oral BID  . senna-docusate  2 tablet Oral BID  . tamsulosin  0.8 mg Oral QPC supper  . torsemide  40 mg Oral Daily  . traMADol  50 mg Oral TID  . vitamin C  500 mg Oral BID  . warfarin  7.5 mg Oral ONCE-1800  . Warfarin - Pharmacist Dosing Inpatient   Does not apply q1800   Continuous Infusions:  PRN Meds: acetaminophen, albuterol, alum & mag hydroxide-simeth, bisacodyl, clonazePAM, guaiFENesin-dextromethorphan, lidocaine, nitroGLYCERIN, nitroGLYCERIN, oxyCODONE, polyethylene glycol, prochlorperazine  **OR** prochlorperazine **OR** prochlorperazine, sodium chloride flush, sodium phosphate, traZODone   Vital Signs    Vitals:   08/27/19 1126 08/27/19 1146 08/27/19 1200 08/27/19 1416  BP: (!) 126/38 (!) 127/47 (!) 151/57 (!) 125/49  Pulse: 69 60 73 61  Resp: 18 18 16 18   Temp:    98.1 F (36.7 C)  TempSrc:    Oral  SpO2: 99% 99% 100% 98%  Weight:      Height:        Intake/Output Summary (Last 24 hours) at 08/27/2019 1531 Last data filed at 08/27/2019 1330 Gross per 24 hour  Intake 780 ml  Output 3101 ml  Net -2321 ml   Last 3 Weights 08/27/2019 08/26/2019 08/25/2019  Weight (lbs) 218 lb 11.1 oz 247 lb 5.7 oz 247 lb 5.7 oz  Weight (kg) 99.2 kg 112.2 kg 112.2 kg      Telemetry    n/a - Personally Reviewed  ECG    Atrial fibrillation.  Slow ventricular response.  Rate 52 bpm.  LAD. Nonspecific IVCD - Personally Reviewed  Physical Exam   VS:  BP (!) 125/49 (BP Location: Right Arm)   Pulse 61   Temp 98.1 F (36.7 C) (Oral)   Resp 18   Ht 5\' 11"  (1.803 m)  Wt 99.2 kg   SpO2 98%   BMI 30.50 kg/m  , BMI Body mass index is 30.5 kg/m. GENERAL:  Chronically ill-appearing.  Diaphoretic. HEENT: Pupils equal round and reactive, fundi not visualized, oral mucosa unremarkable NECK:  No jugular venous distention, waveform within normal limits, carotid upstroke brisk and symmetric, no bruits LUNGS:  Clear to auscultation bilaterally HEART: Irregularly irregular.   PMI not displaced or sustained,S1 and S2 within normal limits, no S3, no S4, no clicks, no rubs, III/VI diastolic murmur at the LUSB ABD:  Flat, positive bowel sounds normal in frequency in pitch, no bruits, no rebound, no guarding, no midline pulsatile mass, no hepatomegaly, no splenomegaly EXT: L AKA.  no edema, no cyanosis no clubbing SKIN:  No rashes no nodules NEURO:  Cranial nerves II through XII grossly intact, motor grossly intact throughout PSYCH:  Cognitively intact, oriented to person place and time    Labs    High Sensitivity Troponin:   Recent Labs  Lab 08/27/19 1030 08/27/19 1228  TROPONINIHS 29* 31*      Chemistry Recent Labs  Lab 08/24/19 0724 08/26/19 1014 08/27/19 0536  NA 139 133* 138  K 4.2 4.6 4.3  CL 101 100 101  CO2 27 23 25   GLUCOSE 114* 236* 188*  BUN 48* 68* 57*  CREATININE 1.36* 1.69* 1.62*  CALCIUM 9.5 9.3 9.6  PROT  --  6.4* 6.7  ALBUMIN  --  2.8* 3.0*  AST  --  30 38  ALT  --  37 36  ALKPHOS  --  43 45  BILITOT  --  0.8 0.6  GFRNONAA 52* 40* 42*  GFRAA >60 47* 49*  ANIONGAP 11 10 12      Hematology Recent Labs  Lab 08/25/19 0506 08/26/19 1014 08/27/19 0536  WBC 12.0* 11.4* 9.8  RBC 3.25* 3.12* 3.48*  HGB 8.4* 8.5* 9.3*  HCT 28.0* 25.9* 29.1*  MCV 86.2 83.0 83.6  MCH 25.8* 27.2 26.7  MCHC 30.0 32.8 32.0  RDW 17.7* 17.5* 17.2*  PLT 374 361 378    BNPNo results for input(s): BNP, PROBNP in the last 168 hours.   DDimer No results for input(s): DDIMER in the last 168 hours.   Radiology    Dg Chest 2 View  Result Date: 08/27/2019 CLINICAL DATA:  Fever. EXAM: CHEST - 2 VIEW COMPARISON:  08/03/2019 FINDINGS: Chronic cardiomegaly. Prosthetic aortic valve. Pulmonary vascularity is normal. Lungs are clear. No effusions. No significant bone abnormality. IMPRESSION: No acute abnormalities. Chronic cardiomegaly. Electronically Signed   By: Lorriane Shire M.D.   On: 08/27/2019 11:41    Cardiac Studies   Cath 06/29/19 Conclusions: 1. Multivessel coronary artery disease, including 50% mid LAD, 80% large OM2, 50% mid RCA, and 80-90% ostial rPDA stenoses. 2. Mildly to moderately elevated left and right heart filling pressures. 3. Mildly reduced Fick cardiac output/index. Recommendations: 1. Gentle post-cath hydration for goal net even fluid balance today. Consider gentle diuresis, as renal function tolerates, beginning tomorrow. 2. Ongoing workup for redo aortic valve replacement +/- CABG per Drs. Claiborne Billings and [place]. 3. Initiate heparin  infusion 2 hours after TR band removal. 4. Aggressive secondary prevention.  TEE 05/29/19 IMPRESSIONS  1. The left ventricle has moderate-severely reduced systolic function, with an ejection fraction of 30-35%. Left ventricular diffuse hypokinesis. 2. The right ventricle has severely reduced systolic function. The cavity was mildly enlarged. 3. Left atrial size was severely dilated. 4. Right atrial size was severely dilated. 5. The mitral valve is grossly  normal. No evidence of mitral valve stenosis. 6. The tricuspid valve was grossly normal. 7. Aortic valve regurgitation is severe by color flow Doppler. 8. There is evidence of mild plaque in the descending aorta. 9. Moderate to severe global reduction in LV systolic function; mild RVE with severe RV dysfunction; severe biatrial enlargement; s/p mechanical AVR with mean gradient of 14 mmHg (both leaflets appear to be mobile); severe perivalvular AI; mild MR and TR.  Patient Profile     68 y.o. male with permanent atrial fibrillation, chronic systolic and diastolic heart failure, mechanical AVR in 2013, severe perivalvular AR, hypertension, hyperlipidemia (intolerant of statins) OSA on CPAP, diabetes, CKD 4, and hepatitis  Assessment & Plan    # Chronic systolic and diastolic heart failure: # RV failure: Volume status and renal function are relatively stable.   Metoprolol and digoxin were reduced to shorten the period of diastasis given his severe AR with low diastolic blood pressure.  Will stop digoxin as his heart rate remains low.  We will need to monitor closely for signs of heart failure. Continue Entresto.  PM torsemide was held yesterday due to concern for early sepsis.  Renal function is labile.  Likely resume tomorrow.   # s/p mechanical AVR:  # Severe perivalvular AR:  Low diastolic blood pressure.  Metoprolol was reduced to reduce time in diastole and he is feeling better.  Heart rates are in the 50-70s.  DBP remains in  the 30-50s with a wide pulse pressure.  Reduced metoprolol and digoxin as above. AVR pending recovery from AKA.  Typically he would follow up with CT surgery after discharge from CIR.    # CAD:  # Chest pain: Severe CAD on cath 06/2019.  CABG/AVR has been on hold due to non-healing wound requiring amputations.  He had chest pain today with PT that was thought to be musculoskeletal.  Would avoid nitrates if possible given his very low diastolic BP.   # Diabetic foot ulcer:  S/p toe amputation 06/2019 followed by BKA 8/28 followed by AKA 08/2019.  Admitted to CIR.  # CKD: Renal function stable. Will consider resuming evening torsemide dose tomorrow.   # Intermittent confusion:  Likely attributable to low DBP and slow HR.  Reducing metoprolol and digoxin has helped. Will stop digoxin as above.   # Hyperlpidemia:  Statin intolerant. Continue Zetia and fenofibrate.  Consider PCSK9 inhibitor as outpatient.   # Persistent atrial fibrillation:  Continue warfarin.  Reduce metoprolol and stop digoxin as above.       For questions or updates, please contact [place] Please consult [email or web address] for contact info under        Signed, Skeet Latch, MD  08/27/2019, 3:31 PM

## 2019-08-27 NOTE — Progress Notes (Signed)
Patient complains of left chest pain claims its like being hit hard on the chest. Vital signs done. [name] PA notified new orders noted.

## 2019-08-27 NOTE — Progress Notes (Signed)
Social Work Patient ID: Joseph Hoffman, male   DOB: 1951/09/25, 68 y.o.   MRN: [id number]  Met with ppt who is taking with [name]-PA regarding issues this am. Aware having a chest x-ray today to check for fluid and placed on bedside therapy in am. Informed of goals of mod/i wheelchair level and target discharge date 10/13. He just wishes he would feel better and focus on therapies and making progress. Have left a message for wife.

## 2019-08-28 ENCOUNTER — Inpatient Hospital Stay (HOSPITAL_COMMUNITY): Payer: Medicare Other

## 2019-08-28 ENCOUNTER — Inpatient Hospital Stay (HOSPITAL_COMMUNITY): Payer: Medicare Other | Admitting: Occupational Therapy

## 2019-08-28 LAB — GLUCOSE, CAPILLARY
Glucose-Capillary: 340 mg/dL — ABNORMAL HIGH (ref 70–99)
Glucose-Capillary: 182 mg/dL — ABNORMAL HIGH (ref 70–99)
Glucose-Capillary: 263 mg/dL — ABNORMAL HIGH (ref 70–99)

## 2019-08-28 LAB — PROTIME-INR
INR: 3.6 — ABNORMAL HIGH (ref 0.8–1.2)
Prothrombin Time: 35 seconds — ABNORMAL HIGH (ref 11.4–15.2)

## 2019-08-28 MED ORDER — WARFARIN SODIUM 7.5 MG PO TABS
7.5000 mg | ORAL_TABLET | Freq: Once | ORAL | Status: AC
  Administered 2019-08-28: 7.5 mg via ORAL
  Filled 2019-08-28: qty 1

## 2019-08-28 NOTE — Progress Notes (Addendum)
Progress Note  Patient Name: Joseph Hoffman Date of Encounter: 08/28/2019  Primary Cardiologist: Shelva Majestic, MD  CT Surgeon: [name]  Subjective   Feeling better. No recurrent chest pain.  Doing well with therapy.   Inpatient Medications    Scheduled Meds:  B-complex with vitamin C  1 tablet Oral Daily   bethanechol  10 mg Oral QID   cholecalciferol  1,000 Units Oral Q breakfast   cyclobenzaprine  5 mg Oral TID   docusate sodium  100 mg Oral BID   escitalopram  10 mg Oral QHS   ezetimibe  10 mg Oral Daily   feeding supplement (PRO-STAT SUGAR FREE 64)  30 mL Oral BID   fenofibrate  160 mg Oral Q breakfast   glimepiride  2 mg Oral Q breakfast   hydrocortisone cream   Topical TID   Icosapent Ethyl  2 g Oral BID   insulin aspart  0-20 Units Subcutaneous TID WC   insulin aspart  0-5 Units Subcutaneous QHS   insulin glargine  44 Units Subcutaneous BID   liraglutide  1.8 mg Subcutaneous QHS   metoprolol succinate  12.5 mg Oral Daily   milk and molasses  1 enema Rectal Once   mometasone-formoterol  2 puff Inhalation BID   pantoprazole  40 mg Oral Daily   polyethylene glycol  17 g Oral BID   Ensure Max Protein  11 oz Oral Daily   saccharomyces boulardii  250 mg Oral 3 times weekly   sacubitril-valsartan  1 tablet Oral BID   senna-docusate  2 tablet Oral BID   tamsulosin  0.8 mg Oral QPC supper   torsemide  40 mg Oral Daily   traMADol  50 mg Oral TID   vitamin C  500 mg Oral BID   warfarin  7.5 mg Oral ONCE-1800   Warfarin - Pharmacist Dosing Inpatient   Does not apply q1800   Continuous Infusions:  PRN Meds: acetaminophen, albuterol, alum & mag hydroxide-simeth, bisacodyl, clonazePAM, guaiFENesin-dextromethorphan, lidocaine, nitroGLYCERIN, nitroGLYCERIN, oxyCODONE, polyethylene glycol, prochlorperazine **OR** prochlorperazine **OR** prochlorperazine, sodium chloride flush, sodium phosphate, traZODone   Vital Signs    Vitals:   08/27/19 1416 08/27/19 1954 08/27/19 2056 08/28/19 0759  BP: (!) 125/49 (!) 136/39  (!) 128/49  Pulse: 61 (!) 54 (!) 58 77  Resp: 18 16 14    Temp: 98.1 F (36.7 C) (!) 97.5 F (36.4 C)    TempSrc: Oral     SpO2: 98% 97% 98%   Weight:      Height:        Intake/Output Summary (Last 24 hours) at 08/28/2019 1118 Last data filed at 08/28/2019 0700 Gross per 24 hour  Intake 910 ml  Output 1800 ml  Net -890 ml   Last 3 Weights 08/27/2019 08/26/2019 08/25/2019  Weight (lbs) 218 lb 11.1 oz 247 lb 5.7 oz 247 lb 5.7 oz  Weight (kg) 99.2 kg 112.2 kg 112.2 kg      Telemetry    n/a - Personally Reviewed  ECG    08/27/19: Atrial fibrillation.  Slow ventricular response.  Rate 52 bpm.  LAD. Nonspecific IVCD - Personally Reviewed  Physical Exam   VS:  BP (!) 128/49    Pulse 77    Temp (!) 97.5 F (36.4 C)    Resp 14    Ht 5\' 11"  (1.803 m)    Wt 99.2 kg    SpO2 98%    BMI 30.50 kg/m  , BMI Body mass index  is 30.5 kg/m. GENERAL: Well-appearing.  No acute distress.  HEENT: Pupils equal round and reactive, fundi not visualized, oral mucosa unremarkable NECK:  No jugular venous distention, waveform within normal limits, carotid upstroke brisk and symmetric, no bruits LUNGS:  Clear to auscultation bilaterally HEART: Irregularly irregular.   PMI not displaced or sustained,S1 and S2 within normal limits, no S3, no S4, no clicks, no rubs, III/VI diastolic murmur at the LUSB ABD:  Flat, positive bowel sounds normal in frequency in pitch, no bruits, no rebound, no guarding, no midline pulsatile mass, no hepatomegaly, no splenomegaly EXT: L AKA.  no edema, no cyanosis no clubbing SKIN:  No rashes no nodules NEURO:  Cranial nerves II through XII grossly intact, motor grossly intact throughout PSYCH:  Cognitively intact, oriented to person place and time   Labs    High Sensitivity Troponin:   Recent Labs  Lab 08/27/19 1030 08/27/19 1228  TROPONINIHS 29* 31*      Chemistry Recent Labs  Lab  08/24/19 0724 08/26/19 1014 08/27/19 0536  NA 139 133* 138  K 4.2 4.6 4.3  CL 101 100 101  CO2 27 23 25   GLUCOSE 114* 236* 188*  BUN 48* 68* 57*  CREATININE 1.36* 1.69* 1.62*  CALCIUM 9.5 9.3 9.6  PROT  --  6.4* 6.7  ALBUMIN  --  2.8* 3.0*  AST  --  30 38  ALT  --  37 36  ALKPHOS  --  43 45  BILITOT  --  0.8 0.6  GFRNONAA 52* 40* 42*  GFRAA >60 47* 49*  ANIONGAP 11 10 12      Hematology Recent Labs  Lab 08/25/19 0506 08/26/19 1014 08/27/19 0536  WBC 12.0* 11.4* 9.8  RBC 3.25* 3.12* 3.48*  HGB 8.4* 8.5* 9.3*  HCT 28.0* 25.9* 29.1*  MCV 86.2 83.0 83.6  MCH 25.8* 27.2 26.7  MCHC 30.0 32.8 32.0  RDW 17.7* 17.5* 17.2*  PLT 374 361 378    BNPNo results for input(s): BNP, PROBNP in the last 168 hours.   DDimer No results for input(s): DDIMER in the last 168 hours.   Radiology    Dg Chest 2 View  Result Date: 08/27/2019 CLINICAL DATA:  Fever. EXAM: CHEST - 2 VIEW COMPARISON:  08/03/2019 FINDINGS: Chronic cardiomegaly. Prosthetic aortic valve. Pulmonary vascularity is normal. Lungs are clear. No effusions. No significant bone abnormality. IMPRESSION: No acute abnormalities. Chronic cardiomegaly. Electronically Signed   By: Lorriane Shire M.D.   On: 08/27/2019 11:41    Cardiac Studies   Cath 06/29/19 Conclusions: 1. Multivessel coronary artery disease, including 50% mid LAD, 80% large OM2, 50% mid RCA, and 80-90% ostial rPDA stenoses. 2. Mildly to moderately elevated left and right heart filling pressures. 3. Mildly reduced Fick cardiac output/index. Recommendations: 1. Gentle post-cath hydration for goal net even fluid balance today. Consider gentle diuresis, as renal function tolerates, beginning tomorrow. 2. Ongoing workup for redo aortic valve replacement +/- CABG per Drs. Claiborne Billings and [place]. 3. Initiate heparin infusion 2 hours after TR band removal. 4. Aggressive secondary prevention.  TEE 05/29/19 IMPRESSIONS  1. The left ventricle has moderate-severely  reduced systolic function, with an ejection fraction of 30-35%. Left ventricular diffuse hypokinesis. 2. The right ventricle has severely reduced systolic function. The cavity was mildly enlarged. 3. Left atrial size was severely dilated. 4. Right atrial size was severely dilated. 5. The mitral valve is grossly normal. No evidence of mitral valve stenosis. 6. The tricuspid valve was grossly normal. 7. Aortic valve  regurgitation is severe by color flow Doppler. 8. There is evidence of mild plaque in the descending aorta. 9. Moderate to severe global reduction in LV systolic function; mild RVE with severe RV dysfunction; severe biatrial enlargement; s/p mechanical AVR with mean gradient of 14 mmHg (both leaflets appear to be mobile); severe perivalvular AI; mild MR and TR.  Patient Profile     68 y.o. male with permanent atrial fibrillation, chronic systolic and diastolic heart failure, mechanical AVR in 2013, severe perivalvular AR, hypertension, hyperlipidemia (intolerant of statins) OSA on CPAP, diabetes, CKD 4, and hepatitis  Assessment & Plan    # Chronic systolic and diastolic heart failure: # RV failure: Volume status and renal function are relatively stable.   Metoprolol and digoxin were reduced to shorten the period of diastasis given his severe AR with low diastolic blood pressure.  Digoxin was stopped 9/24 due to persistently low diastolic BP and heart rate.  Continue metoprolol at reduced dose.  Continue Entresto.  PM torsemide was held yesterday due to concern for early sepsis.  Renal function is labile.  Likely resume tomorrow.   # s/p mechanical AVR:  # Severe perivalvular AR:  Low diastolic blood pressure.  Metoprolol was reduced to reduce time in diastole and he is feeling better.  Heart rates are in the 50-70s.  DBP remains in the 30-50s with a wide pulse pressure.  Reduced metoprolol and stopped digoxin as above. AVR pending recovery from AKA.  Typically he would follow  up with CT surgery after discharge from CIR.  He is very interested in going from CIR to surgery.  This seems unlikely but would be reasonable to have CT surgery weigh in several days before he is thought to be ready for discharge.  Per his rehab MD, this is tentatively 10/18.  His situation is made more complicated by the fact that post median sternotomy he will have weight bearing restrictions.  Given his recent AKA, this means he likely will be unable to stand or participate in rehab in a meaningful way for quite some time.  He would likely need SNF to have his sternum heal enough before he can start bearing on his arms to stand/walk/etc.  Rehab team will continue to weigh in on this.    # CAD:  # Chest pain: Severe CAD on cath 06/2019.  CABG/AVR has been on hold due to non-healing wound requiring amputations.  He had chest pain with PT on 9/24 that was thought to be musculoskeletal.  Would avoid nitrates if possible given his very low diastolic BP.   # Diabetic foot ulcer:  S/p toe amputation 06/2019 followed by BKA 8/28 followed by AKA 08/2019.  Admitted to CIR.  # CKD: Renal function stable. Will consider resuming evening torsemide dose tomorrow.   # Intermittent confusion:  Likely attributable to low DBP and slow HR.  Reducing metoprolol and digoxin has helped. Will stop digoxin as above.   # Hyperlpidemia:  Statin intolerant. Continue Zetia and fenofibrate.  Consider PCSK9 inhibitor as outpatient.   # Persistent atrial fibrillation:  Continue warfarin.  Reduce metoprolol and stop digoxin as above.     [facility] will not see him over the weekend unless there are acute issues.  Please call if there are questions.  For questions or updates, please contact [name] Please consult [email or web address] for contact info under        Signed, Skeet Latch, MD  08/28/2019, 11:18 AM

## 2019-08-28 NOTE — Progress Notes (Signed)
Occupational Therapy Session Note  Patient Details  Name: Joseph Hoffman MRN: [id number] Date of Birth: 1951-11-15  Today's Date: 08/28/2019 OT Individual Time: [id number] OT Individual Time Calculation (min): 75 min    Short Term Goals: Week 2:  OT Short Term Goal 1 (Week 2): Pt will be able to transfer to [facility] and or toilet with min A using LRAD. OT Short Term Goal 2 (Week 2): Pt will be able to bathe LB with S OT Short Term Goal 3 (Week 2): Pt will be able to dress LB with min A at sit > stand level OT Short Term Goal 4 (Week 2): Pt will be able to maintain static stand for 1 minute with min A to pull clothing over hips.  Skilled Therapeutic Interventions/Progress Updates:    Pt resting in bed upon arrival and agreeable to getting his day started.  Pt requested to transfer to w/c for bathing/dressing with sit<>stand from w/c at sink.    Bed mobility-supervision with bed rails Slide board transfer to w/c-CGA after board placement Sit<>stand from w/c at sink with min A X 3 Standing balance with min A Pt required assistance bathing buttocks and pulling pants over hips.  Pt completed all UB tasks with supervision/setup  Pt with no reports of chest pain during session.  Multiple rest breaks  Pt requested to transfer to recliner with slide board-min A  Pt remained in recliner with belt alarm activated and all needs within reach.  Therapy Documentation Precautions:  Precautions Precautions: Fall Precaution Comments: monitor HR, sats and BP Restrictions Weight Bearing Restrictions: Yes LLE Weight Bearing: Non weight bearing Other Position/Activity Restrictions: L AKA - in ace wrap.  no drainage seen through wrap Pain: Pain Assessment Pain Scale: 0-10 Pain Score: 8  Pain Type: Surgical pain Pain Location: Leg Pain Orientation: Left Pain Descriptors / Indicators: Aching Pain Frequency: Constant Pain Onset: On-going Pain Intervention(s): RN aware, meds admin prior to therapy,  repositioned  Therapy/Group: Individual Therapy  [name] 08/28/2019, 10:25 AM

## 2019-08-28 NOTE — Progress Notes (Signed)
Recreational Therapy Session Note  Patient Details  Name: Joseph Hoffman MRN: [id number] Date of Birth: [birth year] Today's Date: 08/28/2019 Time:  1130-1207 Pain: no c/o Skilled Therapeutic Interventions/Progress Updates: Session focused education in regards to relaxation training techniques discussed yesterday.  Provided verbal instruction and handouts on diaphragmatic breathing, simple tension releaser activities and activity analysis.  Reviewed recommendation for pt to journal or take notes as to how he is progressing to help him recognize progress.  Pt has supplies in room for this purpose but is requesting that his wife bring in a specific notebook from home that he prefers to use for this.  Also introduced the use of aromatherapy and pt agreeable to use during his rehab stay.  Discussed the use of aromatherapy as a therapy intervention including type of oil used, how it would be delivered, desired effect and complications from use including headache and or skin irritation depending on how oil is delivered.  2 drops of lavender essential oil was applied to a cotton ball and placed in a labelled medicine cup on bedside table for inhalation in the room.  Nursing alerted about pts use of aromatherapy and instructed to remove the cotton ball with essential oil applied from the room and discard it in the trash if pt requesting removal and/or complains of headache.  Pt stated understanding and is agreement with the above.  Therapy/Group: Individual Therapy   [name] 08/28/2019, 12:07 PM

## 2019-08-28 NOTE — Progress Notes (Signed)
[place] PHYSICAL MEDICINE & REHABILITATION PROGRESS NOTE   Subjective/Complaints:   Pt reports no more Chest pain- feeling good- has been doing therapy so far this AM for ~30 minutes. Pt nervous about being off Digoxin- has been on for 35 years.  Pt really wants to do heart surgery after finishes rehab, and not go home until after heart surgery.   ROS: Denies CP, SOB, N/V/D  Objective:   Dg Chest 2 View  Result Date: 08/27/2019 CLINICAL DATA:  Fever. EXAM: CHEST - 2 VIEW COMPARISON:  08/03/2019 FINDINGS: Chronic cardiomegaly. Prosthetic aortic valve. Pulmonary vascularity is normal. Lungs are clear. No effusions. No significant bone abnormality. IMPRESSION: No acute abnormalities. Chronic cardiomegaly. Electronically Signed   By: Lorriane Shire M.D.   On: 08/27/2019 11:41   Recent Labs    08/26/19 1014 08/27/19 0536  WBC 11.4* 9.8  HGB 8.5* 9.3*  HCT 25.9* 29.1*  PLT 361 378   Recent Labs    08/26/19 1014 08/27/19 0536  NA 133* 138  K 4.6 4.3  CL 100 101  CO2 23 25  GLUCOSE 236* 188*  BUN 68* 57*  CREATININE 1.69* 1.62*  CALCIUM 9.3 9.6    Intake/Output Summary (Last 24 hours) at 08/28/2019 0928 Last data filed at 08/28/2019 0700 Gross per 24 hour  Intake 910 ml  Output 1800 ml  Net -890 ml     Physical Exam: Vital Signs Blood pressure (!) 128/49, pulse 77, temperature (!) 97.5 F (36.4 C), resp. rate 14, height 5\' 11"  (1.803 m), weight 99.2 kg, SpO2 98 %. Pt sitting up in manual w/c, OTx2 in room, doing grooming/bathing at sink, NAD HENT: Normocephalic.  Atraumatic. Eyes: EOMI. No discharge. Cardiovascular: RRR has click Respiratory: CTA B/L- no wheezes, rales, or rhonchi and good air movement again today B/L GI: protuberant, NT, ND, Soft, (+) hypoactive BS Skin: Left AKA with dressing C/D/I-edema MUCH improved Psych: appears sedated/tired/malaise Musc: Left AKA with edema and tenderness Neurological: Alert Motor: Bilateral upper extremities: 5/5  proximal distal Right lower extremity: 4/5 proximal distal, stable Left lower extremity: 4+/5 (pain inhibition), stable   Assessment/Plan: 1. Functional deficits secondary to new L AKA due to ischemia/gangrene which require 3+ hours per day of interdisciplinary therapy in a comprehensive inpatient rehab setting.  Physiatrist is providing close team supervision and 24 hour management of active medical problems listed below.  Physiatrist and rehab team continue to assess barriers to discharge/monitor patient progress toward functional and medical goals  Care Tool:  Bathing    Body parts bathed by patient: Right arm, Left arm, Chest, Abdomen, Front perineal area, Right upper leg, Left upper leg, Face, Right lower leg, Buttocks   Body parts bathed by helper: Buttocks Body parts n/a: Left lower leg   Bathing assist Assist Level: Minimal Assistance - Patient > 75%     Upper Body Dressing/Undressing Upper body dressing   What is the patient wearing?: Pull over shirt    Upper body assist Assist Level: Set up assist    Lower Body Dressing/Undressing Lower body dressing      What is the patient wearing?: Underwear/pull up, Pants     Lower body assist Assist for lower body dressing: Contact Guard/Touching assist     Toileting Toileting    Toileting assist Assist for toileting: Maximal Assistance - Patient 25 - 49% Assistive Device Comment: bedpan   Transfers Chair/bed transfer  Transfers assist  Chair/bed transfer activity did not occur: Safety/medical concerns  Chair/bed transfer assist level: Contact  Guard/Touching assist Chair/bed transfer assistive device: Sliding board   Locomotion Ambulation   Ambulation assist   Ambulation activity did not occur: Safety/medical concerns(labial BP and HR sitting EOB.)  Assist level: Minimal Assistance - Patient > 75% Assistive device: Parallel bars Max distance: 6'   Walk 10 feet activity   Assist  Walk 10 feet  activity did not occur: Safety/medical concerns(labial BP and HR sitting EOB.)        Walk 50 feet activity   Assist Walk 50 feet with 2 turns activity did not occur: Safety/medical concerns(labial BP and HR sitting EOB.)         Walk 150 feet activity   Assist Walk 150 feet activity did not occur: Safety/medical concerns(labial BP and HR sitting EOB.)         Walk 10 feet on uneven surface  activity   Assist Walk 10 feet on uneven surfaces activity did not occur: Safety/medical concerns(labial BP and HR sitting EOB.)         Wheelchair     Assist Will patient use wheelchair at discharge?: Yes Type of Wheelchair: Manual Wheelchair activity did not occur: Safety/medical concerns(labial BP and HR sitting EOB.)  Wheelchair assist level: Supervision/Verbal cueing Max wheelchair distance: 50    Wheelchair 50 feet with 2 turns activity    Assist    Wheelchair 50 feet with 2 turns activity did not occur: Safety/medical concerns(labial BP and HR sitting EOB.)   Assist Level: Supervision/Verbal cueing   Wheelchair 150 feet activity     Assist  Wheelchair 150 feet activity did not occur: Safety/medical concerns(labial BP and HR sitting EOB.)   Assist Level: Set up assist, Supervision/Verbal cueing   Blood pressure (!) 128/49, pulse 77, temperature (!) 97.5 F (36.4 C), resp. rate 14, height 5\' 11"  (1.803 m), weight 99.2 kg, SpO2 98 %.  Medical Problem List and Plan: 1.Fxnl and mobility deficitssecondary to PAD causing left AKA  Cont CIR 2.Mechanical AVR/Antithrombotics: -DVT/anticoagulation:Pharmaceutical:Coumadin and heparin GGT INR> 2.5.  Unfractionated heparin within normal range on 9/20   INR 2.4 on 9/20   9/21- INR 2.6- pharmacy stopped Heparin gtt- will con't coumadin mgmt  9/22- INR 2.9  9/23- INR 3.1- per pharmacy  9/24- INR 3.3- per pharmacy  9/25- INR 3.6- per pharmacy  -antiplatelet therapy: N/A 3. Pain  Management:  Scheduled low dose flexeril tid to help with spasms.  May need neurontin titrate upwarrds. Scheduled tramadol 50 mg TID and will use oxy only when needs it.  Appears relatively controlled with medication on 9/20  Encouraged desensitization techniques 4. Mood:LCSW to follow for evaluation and support. -antipsychotic agents: N/A 5. Neuropsych: This patientiscapable of making decisions on hisown behalf. 6. Skin/Wound Care:Monitor wound daily. Monitor stump for signs and symptoms of infection  Drainage continues to improve, decrease dressing changes to daily  9/25- residual limb edema much improved- might need new sock. 7. Fluids/Electrolytes/Nutrition:Monitor I/Os 8. T2DM: Monitor BS ac/hs. Continue Lantus 40 mg bid with Victoza  Resumed amaryl 2mg    9/15- increase Lantus to 44 units BID CBG (last 3)  Recent Labs    08/27/19 1642 08/27/19 2056 08/28/19 0620  GLUCAP 152* 340* 182*   Labile on 9/20, monitor for trend  9/21- BGs much better controlled  9/24- running low 200s- will try to increase lantus slightly. 9. CAD/CAF: Monitor for presyncope/syncope as back on Entresto.   Appreciate cards recs, metoprolol decreased  Dig level low on 9/18  Pulse pressure remains wide on 9/20  Heart  rate improving on 9/20  9/21- Cards- decr Metoprolol and Digoxin- pt feeling much better   9/25- Digoxin stopped per Cardiology 10 Chronic systolic CHF: Heart healthy diet and monitor for signs of overload. Check weight daily. On Entresto, Demadex, metoprolol and fenofibrate, Vascepa.    Filed Weights   08/25/19 0304 08/26/19 0403 08/27/19 0527  Weight: 112.2 kg 112.2 kg 99.2 kg   ? Reliability on 9/20  9/23- Weight stable since yesterday- not sure if previous weight accurate 11. Urinary retention:   PVRs elevated  Continue Flomax  9/23- voiding "all night" per pt- which was new- didn't require cath o/n 12. Constipation:Start bowel  program as this has been an issues since first surgery.   9/23- LBM o/n 13.  Acute blood loss anemia  Transfused 1 unit PRBC on 9/16  Hemoglobin 8.6 on 9/20  Continue to monitor 13. Hyponatremia:   Sodium 135 on 9/17, labs ordered for tomorrow  9/23- Na 133  Continue to monitor 14. OSA: CPAP when sleeping with 2 L bleed in.  15. Acute on chronic renal failure:   Creatinine 1.66 on 9/17, labs ordered for tomorrow  Encourage fluids  9/21- Cr down to 1.4  9/23- Cr up to 1.69 and BUN up to 68- is dry, Cards held QHS torsemide  9/24 Cr 1.62 and BUN down to 57 after Torsemide held  Continue to monitor 16.  Transaminitis  LFTs elevated on 9/17, labs ordered for tomorrow  9/21- BMP was ordered- will recheck [facility] Thursday  9/24- ALT/AST slightly elevated in 40s  Continue to monitor 17. Lethargy/malaise  9/23- U/A (-) for UTI- CBC shows mild leukocytosis; of 11.4k can't just start ABX, unsure as to why- AKA looks OK, per nursing; might be cold/URI (is more nasal); could be dehydration since Cr up and BUN significantly up. Will check labs in AM and monitor more closely.  9/24- resolved today.  18. Dispo  9/25- will order 15/7 for therapy as of Monday- per OT request.- so pt doesn't do more than 30-45 minutes of therapy max at a time.   LOS: 11 days A FACE TO FACE EVALUATION WAS PERFORMED  Phares Zaccone 08/28/2019, 9:28 AM

## 2019-08-28 NOTE — Progress Notes (Signed)
Occupational Therapy Session Note  Patient Details  Name: Joseph Hoffman MRN: [id number] Date of Birth: Feb 16, 1951  Today's Date: 08/28/2019 OT Individual Time: 1330-1425 OT Individual Time Calculation (min): 55 min    Short Term Goals: Week 2:  OT Short Term Goal 1 (Week 2): Pt will be able to transfer to [facility] and or toilet with min A using LRAD. OT Short Term Goal 2 (Week 2): Pt will be able to bathe LB with S OT Short Term Goal 3 (Week 2): Pt will be able to dress LB with min A at sit > stand level OT Short Term Goal 4 (Week 2): Pt will be able to maintain static stand for 1 minute with min A to pull clothing over hips.  Skilled Therapeutic Interventions/Progress Updates:    OT intervention with focus on functional transfers, sitting balance, sit<>stand, standing balance, BUE therex, and activity tolerance to increase indpendence with BADLs. Pt resting in recliner upon arrival and agreeable to therapy.  Pt performed squat/scoot  pivot transfer to w/c with mod A. Pt transitioned to gym and performed slide board transfer to mat with CGA.  Pt engaged in sit<>stand from mat with min A and engaged in standing task removing horseshoes from basketball rim.  Pt stood with min A for balance.  After rest, pt stood again and placed horseshoes back on rim with min A for balance.  Pt attempted to lay prone but reported residual limb pain with pressure on mat.  Pt performed supine>sit EOM with min A and min verbal cues for sequencing. Pt engaged in BUE therex on [employer] (7 mins X 1 level 5 30 RPM). Pt stated he could "feel it" in his arm but did not report any chest pain.  Pt returned to room and performed slide board transfer back to bed with CGA.  Pt remained in bed with all needs within reach and bed alarm activated.   Therapy Documentation Precautions:  Precautions Precautions: Fall Precaution Comments: monitor HR, sats and BP Restrictions Weight Bearing Restrictions: Yes LLE Weight Bearing: Non  weight bearing Other Position/Activity Restrictions: L AKA - in ace wrap.  no drainage seen through wrap Pain:  Pt reports "it is ok"   Therapy/Group: Individual Therapy  [name] 08/28/2019, 2:36 PM

## 2019-08-28 NOTE — Progress Notes (Signed)
Physical Therapy Session Note  Patient Details  Name: Joseph Hoffman MRN: [id number] Date of Birth: [birth year]  Today's Date: 08/28/2019 PT Individual Time: 1030-1129 PT Individual Time Calculation (min): 59 min   Short Term Goals: Week 1:  PT Short Term Goal 1 (Week 1): Patient will perform bed mobility with min A. PT Short Term Goal 1 - Progress (Week 1): Met PT Short Term Goal 2 (Week 1): Pt will perform bed<>chair transfer w/ mod assist +2 for safety. PT Short Term Goal 2 - Progress (Week 1): Met PT Short Term Goal 3 (Week 1): Pt will tolerate sitting OOB in between therapy sessions for 1 hour. PT Short Term Goal 3 - Progress (Week 1): Met PT Short Term Goal 4 (Week 1): Pt will self-propel w/c 50' w/ supervision PT Short Term Goal 4 - Progress (Week 1): Met PT Short Term Goal 5 (Week 1): Patient will initiate standing with mod A +2 using LRAD. PT Short Term Goal 5 - Progress (Week 1): Met Week 2:  PT Short Term Goal 1 (Week 2): Patient will perform bed mobility with supervision. PT Short Term Goal 2 (Week 2): Patient will perform sit<>stand with min A using RW. PT Short Term Goal 3 (Week 2): Patient will perform basic transfers with min A. PT Short Term Goal 4 (Week 2): Patient will initiate ambulation with RW. Week 3:     Skilled Therapeutic Interventions/Progress Updates:    Pt initially OOB in recliner requesting to usee BR Sit to stand from recliner W/RW and min assist. Recliner to wc w/RW and min assist plus cues for seqencing/hand placement. wc to commode w/RW and min assist plus cues for sequencing/safety/hand placement. Pt stood w/walker w/cga while therapist lowered and raised pants for pt Commode to wc w/min assist, cues as above.  HR 65, BP 159/55  Repeated sit to stand from wc x 3 for strengtheing.   wc propulsion x 131f w/bilat UEs for strengthening and endurance training. wc to/from Nustep w/min assist w/RW  NuStep L2 5 min x 2 w/221m rest break between  efforts, w/EXTs x 3 RPE 5/10 for cardiovascular conditioining.  HR remained 68-80.  wc to recliner w/RW and min assist of 1.  Pt left OOB in recliner w/chair alarm set and needs in reach.  Assessment:  Pt w/no complaints today during session and vitals remained stable w/activity.  Able to perform multiple transfers including commode transfer w/RW.    Therapy Documentation Precautions:  Precautions Precautions: Fall Precaution Comments: monitor HR, sats and BP Restrictions Weight Bearing Restrictions: Yes LLE Weight Bearing: Non weight bearing Other Position/Activity Restrictions: L AKA - in ace wrap.  no drainage seen through wrap PAIN L hip 7/10, treatement to tolerance.   Therapy/Group: Individual Therapy  BaCallie FieldingPTAptos Hills-Larkin [place]/25/2020, 12:41 PM

## 2019-08-28 NOTE — Progress Notes (Signed)
ANTICOAGULATION CONSULT NOTE - Follow Up Consult  Pharmacy Consult for warfarin Indication: Mechanical valve  Patient Measurements: Height: 5\' 11"  (180.3 cm) Weight: 218 lb 11.1 oz (99.2 kg) IBW/kg (Calculated) : 75.3  Vital Signs: BP: 128/49 (09/25 0759) Pulse Rate: 77 (09/25 0759)  Labs: Recent Labs    08/26/19 0734 08/26/19 1014 08/27/19 0536 08/27/19 1030 08/27/19 1228 08/28/19 0634  HGB  --  8.5* 9.3*  --   --   --   HCT  --  25.9* 29.1*  --   --   --   PLT  --  361 378  --   --   --   LABPROT 31.5*  --  33.0*  --   --  35.0*  INR 3.1*  --  3.3*  --   --  3.6*  CREATININE  --  1.69* 1.62*  --   --   --   TROPONINIHS  --   --   --  29* 31*  --      Assessment: 68 year old male on warfarin for mechanical AVR who received INR reversal (Vitamin K 5mg  IV) on 9/9 for an AKA. Patient was on heparin as a bridge, warfarin was resumed per pharmacy dosing on 9/12. PTA warfarin dose: 10 mg daily except 12.5 mg on Tues/Thurs/Sat.  Current INR is slightly supratherapeutic 3.6.  Goal of Therapy:  INR 2.5-3.5 Monitor platelets by anticoagulation protocol: Yes   Plan:  - Repeat 7.5 mg po x1 as this is already a reduced dose for this patient - Monitor daily INR, CBC, s/sx bleeding   [name] 08/28/2019 8:47 AM

## 2019-08-29 ENCOUNTER — Inpatient Hospital Stay (HOSPITAL_COMMUNITY): Payer: Medicare Other

## 2019-08-29 ENCOUNTER — Inpatient Hospital Stay (HOSPITAL_COMMUNITY): Payer: Medicare Other | Admitting: *Deleted

## 2019-08-29 LAB — PROTIME-INR
INR: 3.3 — ABNORMAL HIGH (ref 0.8–1.2)
Prothrombin Time: 33.3 seconds — ABNORMAL HIGH (ref 11.4–15.2)

## 2019-08-29 MED ORDER — WARFARIN SODIUM 7.5 MG PO TABS
12.5000 mg | ORAL_TABLET | Freq: Once | ORAL | Status: AC
  Administered 2019-08-29: 18:00:00 12.5 mg via ORAL
  Filled 2019-08-29: qty 1

## 2019-08-29 MED ORDER — INSULIN GLARGINE 100 UNIT/ML ~~LOC~~ SOLN
47.0000 [IU] | Freq: Two times a day (BID) | SUBCUTANEOUS | Status: DC
  Administered 2019-08-29 – 2019-09-05 (×14): 47 [IU] via SUBCUTANEOUS
  Filled 2019-08-29 (×18): qty 0.47

## 2019-08-29 NOTE — Progress Notes (Signed)
Occupational Therapy Session Note  Patient Details  Name: Joseph Hoffman MRN: [id number] Date of Birth: 04/29/1951  Today's Date: 08/29/2019 OT Individual Time: 1240-1400 OT Individual Time Calculation (min): 80 min    Short Term Goals: Week 1:  OT Short Term Goal 1 (Week 1): Pt will be able to transfer to BSC and or toilet with mod A using LRAD. OT Short Term Goal 1 - Progress (Week 1): Met OT Short Term Goal 2 (Week 1): Pt will be able to bathe LB with min A. OT Short Term Goal 2 - Progress (Week 1): Met OT Short Term Goal 3 (Week 1): Pt will be able to dress LB with mod A. OT Short Term Goal 3 - Progress (Week 1): Met OT Short Term Goal 4 (Week 1): Pt will be able to maintain static stand for 1 minute with mod A to pull clothing over hips. OT Short Term Goal 4 - Progress (Week 1): Progressing toward goal  Skilled Therapeutic Interventions/Progress Updates:  overallsession focus and participation as follows:  Recliner to w/c transfer via stand pivot with 4 hops to patient left=CGA  Patient voiced need to discuss his shower setup at home. Patient education as he showed pictures of his bathroom setup and he and clinician discussed possibilities for transfer into/out of his home shower W/c to/fr shower bench transfer= extra time and moderate asisstance and rest breaks as patient fatigued after multiple transfer training (Min A - moderate assistance); UE strengthening to increase transfers and safety/independence with ADLs; and sit to stand practice (CGA to Min A and cues for proper hand placement and technique)  W/c to bed transfer via sliding board to patient left AKA side (chose sliding board as patient felt was more energy efficient at the en dof the session rather than stand pivot at walker)= Min A  Bed mobility=Min A  Patient left lying in bed to rest as requested with call bell and phone within reach and all 4 bedrails up as he requrested  Continue OT plan of care for this  patient      Therapy Documentation Precautions:  Precautions Precautions: Fall Precaution Comments: monitor HR, sats and BP Restrictions Weight Bearing Restrictions: Yes LLE Weight Bearing: Non weight bearing Other Position/Activity Restrictions: L AKA - in ace wrap.  no drainage seen through wrap Pain:denied     therapy/Group: Individual Therapy  Pickett, Robyn Yeary 08/29/2019, 5:50 PM 

## 2019-08-29 NOTE — Progress Notes (Signed)
Physical Therapy Session Note  Patient Details  Name: Joseph Hoffman MRN: [id number] Date of Birth: [birth year]  Today's Date: 08/29/2019 PT Individual Time: 0802-0858 PT Individual Time Calculation (min): 56 min   Short Term Goals: Week 2:  PT Short Term Goal 1 (Week 2): Patient will perform bed mobility with supervision. PT Short Term Goal 2 (Week 2): Patient will perform sit<>stand with min A using RW. PT Short Term Goal 3 (Week 2): Patient will perform basic transfers with min A. PT Short Term Goal 4 (Week 2): Patient will initiate ambulation with RW.  Skilled Therapeutic Interventions/Progress Updates:     Patient in bed upon PT arrival. Patient alert and agreeable to PT session. Patient denied pain in his residual limb this morning, however, reports 5/10 L lateral hip pain starting following a "pop" with OT yesterday. PT provided repositioning, rest breaks, and distraction as pain interventions throughout session. Patient reported that he had a great day with therapies yesterday. He requested to use the bathroom at beginning of session.   Therapeutic Activity: Bed Mobility: Patient performed supine to sit with supervision with heavy use of the bed rail with the bed flat. Provided verbal cues for sliding LEs off the bed first before pushing to sit up. Transfers: Patient performed a slide board transfer from bed>w/c, a lateral scoot transfer w/c<>BSC over the toilet, and sit to/from stand x3 in the // bars with min A. Provided verbal cues for board and hand placement, patient used teach back method for head-hips relationship, and provided cues for leaning far forward to stand in // bars. Required total A for peri-care and max A for LB dressing during toileting.   Wheelchair Mobility:  Patient propelled wheelchair ~150 feet x2 with supervision and set-up assist due to decreased sitting balance and flexibility . Provided verbal cues for use of breaks, w/c set up for transfers, and  demonstration for donning/doffing leg rests.  Therapeutic Exercise: Patient performed the following exercises with verbal and tactile cues for proper technique. -standing hip extension to neutral (to prevent hip flexion compensation) provided tactile cues to anterior hip and visual cues using the mirror on his L side for reaching neutral hip extension  Patient in w/c at end of session with breaks locked, seat belt alarm set, and all needs within reach. Patient was asymptomatic throughout session HR 55-80 bpm throughout.  Therapy Documentation Precautions:  Precautions Precautions: Fall Precaution Comments: monitor HR, sats and BP Restrictions Weight Bearing Restrictions: Yes LLE Weight Bearing: Non weight bearing Other Position/Activity Restrictions: L AKA - in ace wrap.  no drainage seen through wrap    Therapy/Group: Individual Therapy  Nadia Viar L Joseph Hoffman PT, DPT  08/29/2019, 4:32 PM

## 2019-08-29 NOTE — Progress Notes (Signed)
Progress Note  Patient Name: Joseph Hoffman Date of Encounter: 08/29/2019  Primary Cardiologist: Shelva Majestic, MD   Subjective   No SOB, no cardiac complaints  Inpatient Medications    Scheduled Meds: . B-complex with vitamin C  1 tablet Oral Daily  . bethanechol  10 mg Oral QID  . cholecalciferol  1,000 Units Oral Q breakfast  . cyclobenzaprine  5 mg Oral TID  . docusate sodium  100 mg Oral BID  . escitalopram  10 mg Oral QHS  . ezetimibe  10 mg Oral Daily  . feeding supplement (PRO-STAT SUGAR FREE 64)  30 mL Oral BID  . fenofibrate  160 mg Oral Q breakfast  . glimepiride  2 mg Oral Q breakfast  . hydrocortisone cream   Topical TID  . Icosapent Ethyl  2 g Oral BID  . insulin aspart  0-20 Units Subcutaneous TID WC  . insulin aspart  0-5 Units Subcutaneous QHS  . insulin glargine  47 Units Subcutaneous BID  . liraglutide  1.8 mg Subcutaneous QHS  . metoprolol succinate  12.5 mg Oral Daily  . milk and molasses  1 enema Rectal Once  . mometasone-formoterol  2 puff Inhalation BID  . pantoprazole  40 mg Oral Daily  . polyethylene glycol  17 g Oral BID  . Ensure Max Protein  11 oz Oral Daily  . saccharomyces boulardii  250 mg Oral 3 times weekly  . sacubitril-valsartan  1 tablet Oral BID  . senna-docusate  2 tablet Oral BID  . tamsulosin  0.8 mg Oral QPC supper  . torsemide  40 mg Oral Daily  . traMADol  50 mg Oral TID  . vitamin C  500 mg Oral BID  . Warfarin - Pharmacist Dosing Inpatient   Does not apply q1800   Continuous Infusions:  PRN Meds: acetaminophen, albuterol, alum & mag hydroxide-simeth, bisacodyl, clonazePAM, guaiFENesin-dextromethorphan, lidocaine, nitroGLYCERIN, nitroGLYCERIN, oxyCODONE, polyethylene glycol, prochlorperazine **OR** prochlorperazine **OR** prochlorperazine, sodium chloride flush, sodium phosphate, traZODone   Vital Signs    Vitals:   08/28/19 1252 08/28/19 1950 08/28/19 2056 08/29/19 0419  BP: (!) 136/47 (!) 120/44  (!) 132/44   Pulse: 72 60 60 68  Resp: 18 19 19 20   Temp:  98.7 F (37.1 C)  98.6 F (37 C)  TempSrc:  Oral    SpO2: 99% 97% 98% 98%  Weight:    98.8 kg  Height:    5\' 11"  (1.803 m)    Intake/Output Summary (Last 24 hours) at 08/29/2019 1120 Last data filed at 08/29/2019 0753 Gross per 24 hour  Intake -  Output 2125 ml  Net -2125 ml   Last 3 Weights 08/29/2019 08/27/2019 08/26/2019  Weight (lbs) 217 lb 13 oz 218 lb 11.1 oz 247 lb 5.7 oz  Weight (kg) 98.8 kg 99.2 kg 112.2 kg      Telemetry    n/a - Personally Reviewed  ECG    n/a - Personally Reviewed  Physical Exam   GEN: No acute distress.   Neck: No JVD Cardiac: RRR, 3/6 diastolic murmur rusb Respiratory: Clear to auscultation bilaterally. GI: Soft, nontender, non-distended  MS: No edema; No deformity. Neuro:  Nonfocal  Psych: Normal affect   Labs    High Sensitivity Troponin:   Recent Labs  Lab 08/27/19 1030 08/27/19 1228  TROPONINIHS 29* 31*      Chemistry Recent Labs  Lab 08/24/19 0724 08/26/19 1014 08/27/19 0536  NA 139 133* 138  K 4.2 4.6 4.3  CL 101 100 101  CO2 27 23 25   GLUCOSE 114* 236* 188*  BUN 48* 68* 57*  CREATININE 1.36* 1.69* 1.62*  CALCIUM 9.5 9.3 9.6  PROT  --  6.4* 6.7  ALBUMIN  --  2.8* 3.0*  AST  --  30 38  ALT  --  37 36  ALKPHOS  --  43 45  BILITOT  --  0.8 0.6  GFRNONAA 52* 40* 42*  GFRAA >60 47* 49*  ANIONGAP 11 10 12      Hematology Recent Labs  Lab 08/25/19 0506 08/26/19 1014 08/27/19 0536  WBC 12.0* 11.4* 9.8  RBC 3.25* 3.12* 3.48*  HGB 8.4* 8.5* 9.3*  HCT 28.0* 25.9* 29.1*  MCV 86.2 83.0 83.6  MCH 25.8* 27.2 26.7  MCHC 30.0 32.8 32.0  RDW 17.7* 17.5* 17.2*  PLT 374 361 378    BNPNo results for input(s): BNP, PROBNP in the last 168 hours.   DDimer No results for input(s): DDIMER in the last 168 hours.   Radiology    No results found.  Cardiac Studies     Patient Profile     68 y.o. male with permanent atrial fibrillation, chronic systolic and  diastolic heart failure, mechanical AVR in 2013, severe perivalvular AR, hypertension, hyperlipidemia (intolerant of statins) OSA on CPAP, diabetes, CKD 4, and hepatitis  Assessment & Plan   1. Chronic biventricular failure - - ECHO 05/2019 EF 30-35% Severe AI, severe RV dysfunction - medical therapy with toprol 12.5mg , entresto 24/26mg  bid, torsemide 40mg  daily - avoid higher beta blocker dose due to severe AI, want to have higher HRs and less diastole - appears euvolemic, continue current meds  2.  CAD - LHC 06/29/19: Multivessel coronary disease, 50% mid LAD, 80% large OM2, 50% mid RCA, and 80-90% ostial rPDA stenoses.  - possible CABG at time of AVR in the future   3. Severe Aortic insuffiency in setting of mechanical AVR - H/O Mechanical AVR 2014 [facility]  - pending re-do high-risk AVR +/- CABG when other medical issues stable. - possible AVR once recovered from AKA - stable and normal SBP, wide pulse pressure with low DBP related to severe AI  5. Chronic A fib  - Rate controlled on Toprol XL and digoxin. Doses cut back due to bradycardia - on hep gtt currently for flexibility for invasive procedures    6. CKD Stage IV  - Creatinine baseline ~2 previously, down to 1.18 this admit  7. Diabetic foot ulcer:  S/p toe amputation 06/2019 followed by BKA 8/28 followed by AKA 08/2019.  Admitted to CIR.    No additional recs from cardiac standpoint over the weekend, call with questions.    For questions or updates, please contact [place] Please consult [email or web address] for contact info under        Signed, Carlyle Dolly, MD  08/29/2019, 11:20 AM

## 2019-08-29 NOTE — Progress Notes (Addendum)
ANTICOAGULATION CONSULT NOTE - Follow Up Consult  Pharmacy Consult for warfarin Indication: Mechanical valve  Patient Measurements: Height: 5\' 11"  (180.3 cm) Weight: 217 lb 13 oz (98.8 kg) IBW/kg (Calculated) : 75.3  Vital Signs: Temp: 98.6 F (37 C) (09/26 0419) BP: 132/44 (09/26 0419) Pulse Rate: 68 (09/26 0419)  Labs: Recent Labs    08/27/19 0536 08/27/19 1030 08/27/19 1228 08/28/19 0634 08/29/19 0556  HGB 9.3*  --   --   --   --   HCT 29.1*  --   --   --   --   PLT 378  --   --   --   --   LABPROT 33.0*  --   --  35.0* 33.3*  INR 3.3*  --   --  3.6* 3.3*  CREATININE 1.62*  --   --   --   --   TROPONINIHS  --  29* 31*  --   --      Assessment: 68 year old male on warfarin for mechanical AVR who received INR reversal (Vitamin K 5mg  IV) on 9/9 for an AKA. Patient was on heparin as a bridge, warfarin was resumed per pharmacy dosing on 9/12. PTA warfarin dose: 10 mg daily except 12.5 mg on Tues/Thurs/Sat.  INR slightly supratherapeutic at 3.6 on 9/25, now down to 3.3 after reducing home dose x 2 days. CBC stable. No active bleed issues documented.  Goal of Therapy:  INR 2.5-3.5 Monitor platelets by anticoagulation protocol: Yes   Plan:  - Warfarin 12.5 mg PO x 1 - try to resume home dose and watch INR trend closely - Monitor daily INR, CBC, s/sx bleeding  [name], PharmD, BCPS Please check AMION for all [place] contact numbers Clinical Pharmacist 08/29/2019 11:52 AM

## 2019-08-29 NOTE — Progress Notes (Signed)
[facility] PHYSICAL MEDICINE & REHABILITATION PROGRESS NOTE   Subjective/Complaints:   Pt denies any issues. Pain is controlled.   ROS: Patient denies fever, rash, sore throat, blurred vision, nausea, vomiting, diarrhea, cough, shortness of breath or chest pain,   headache, or mood change.    Objective:   Dg Chest 2 View  Result Date: 08/27/2019 CLINICAL DATA:  Fever. EXAM: CHEST - 2 VIEW COMPARISON:  08/03/2019 FINDINGS: Chronic cardiomegaly. Prosthetic aortic valve. Pulmonary vascularity is normal. Lungs are clear. No effusions. No significant bone abnormality. IMPRESSION: No acute abnormalities. Chronic cardiomegaly. Electronically Signed   By: Lorriane Shire M.D.   On: 08/27/2019 11:41   Recent Labs    08/27/19 0536  WBC 9.8  HGB 9.3*  HCT 29.1*  PLT 378   Recent Labs    08/27/19 0536  NA 138  K 4.3  CL 101  CO2 25  GLUCOSE 188*  BUN 57*  CREATININE 1.62*  CALCIUM 9.6    Intake/Output Summary (Last 24 hours) at 08/29/2019 1044 Last data filed at 08/29/2019 0753 Gross per 24 hour  Intake -  Output 2125 ml  Net -2125 ml     Physical Exam: Vital Signs Blood pressure (!) 132/44, pulse 68, temperature 98.6 F (37 C), resp. rate 20, height 5\' 11"  (1.803 m), weight 98.8 kg, SpO2 98 %. Constitutional: No distress . Vital signs reviewed. obese HEENT: EOMI, oral membranes moist Neck: supple Cardiovascular: RRR without murmur. No JVD    Respiratory: CTA Bilaterally without wheezes or rales. Normal effort    GI: BS +, non-tender   Psych: alert and pleasant Musc: Left AKA with edema and tenderness Skin: incision cdi, dressed Neurological: Alert Motor: Bilateral upper extremities: 5/5 proximal distal Right lower extremity: 4/5 proximal distal, stable Left lower extremity: 4+/5 (pain inhibition), stable   Assessment/Plan: 1. Functional deficits secondary to new L AKA due to ischemia/gangrene which require 3+ hours per day of interdisciplinary therapy in a  comprehensive inpatient rehab setting.  Physiatrist is providing close team supervision and 24 hour management of active medical problems listed below.  Physiatrist and rehab team continue to assess barriers to discharge/monitor patient progress toward functional and medical goals  Care Tool:  Bathing    Body parts bathed by patient: Right arm, Left arm, Chest, Abdomen, Front perineal area, Right upper leg, Right lower leg   Body parts bathed by helper: Buttocks Body parts n/a: Left upper leg, Left lower leg   Bathing assist Assist Level: Minimal Assistance - Patient > 75%     Upper Body Dressing/Undressing Upper body dressing   What is the patient wearing?: Pull over shirt    Upper body assist Assist Level: Set up assist    Lower Body Dressing/Undressing Lower body dressing      What is the patient wearing?: Underwear/pull up, Pants     Lower body assist Assist for lower body dressing: Moderate Assistance - Patient 50 - 74%(sit<>stand at sink)     Chartered loss adjuster assist Assist for toileting: Maximal Assistance - Patient 25 - 49% Assistive Device Comment: bedpan   Transfers Chair/bed transfer  Transfers assist  Chair/bed transfer activity did not occur: Safety/medical concerns  Chair/bed transfer assist level: Minimal Assistance - Patient > 75% Chair/bed transfer assistive device: Sliding board   Locomotion Ambulation   Ambulation assist   Ambulation activity did not occur: Safety/medical concerns(labial BP and HR sitting EOB.)  Assist level: Minimal Assistance - Patient > 75% Assistive device: Parallel  bars Max distance: 6'   Walk 10 feet activity   Assist  Walk 10 feet activity did not occur: Safety/medical concerns(labial BP and HR sitting EOB.)        Walk 50 feet activity   Assist Walk 50 feet with 2 turns activity did not occur: Safety/medical concerns(labial BP and HR sitting EOB.)         Walk 150 feet  activity   Assist Walk 150 feet activity did not occur: Safety/medical concerns(labial BP and HR sitting EOB.)         Walk 10 feet on uneven surface  activity   Assist Walk 10 feet on uneven surfaces activity did not occur: Safety/medical concerns(labial BP and HR sitting EOB.)         Wheelchair     Assist Will patient use wheelchair at discharge?: Yes Type of Wheelchair: Manual Wheelchair activity did not occur: Safety/medical concerns(labial BP and HR sitting EOB.)  Wheelchair assist level: Supervision/Verbal cueing Max wheelchair distance: 50    Wheelchair 50 feet with 2 turns activity    Assist    Wheelchair 50 feet with 2 turns activity did not occur: Safety/medical concerns(labial BP and HR sitting EOB.)   Assist Level: Supervision/Verbal cueing   Wheelchair 150 feet activity     Assist  Wheelchair 150 feet activity did not occur: Safety/medical concerns(labial BP and HR sitting EOB.)   Assist Level: Set up assist, Supervision/Verbal cueing   Blood pressure (!) 132/44, pulse 68, temperature 98.6 F (37 C), resp. rate 20, height 5\' 11"  (1.803 m), weight 98.8 kg, SpO2 98 %.  Medical Problem List and Plan: 1.Fxnl and mobility deficitssecondary to PAD causing left AKA  Cont CIR 2.Mechanical AVR/Antithrombotics: -DVT/anticoagulation:Pharmaceutical:Coumadin and heparin GGT INR> 2.5.  Unfractionated heparin within normal range on 9/20   INR 2.4 on 9/20   9/21- INR 2.6- pharmacy stopped Heparin gtt- will con't coumadin mgmt  9/22- INR 2.9  9/23- INR 3.1- per pharmacy  9/24- INR 3.3- per pharmacy  9/26- INR 3.3- per pharmacy  -antiplatelet therapy: N/A 3. Pain Management:  Scheduled low dose flexeril tid to help with spasms.  May need neurontin titrate upwarrds. Scheduled tramadol 50 mg TID and will use oxy only when needs it.  Reasonable control at present 4. Mood:LCSW to follow for evaluation and  support. -antipsychotic agents: N/A 5. Neuropsych: This patientiscapable of making decisions on hisown behalf. 6. Skin/Wound Care:Monitor wound daily. Monitor stump for signs and symptoms of infection  Daily dressings, drainage decr 7. Fluids/Electrolytes/Nutrition:Monitor I/Os 8. T2DM: Monitor BS ac/hs. Continue Lantus 40 mg bid with Victoza  Resumed amaryl 2mg    9/15- increase Lantus to 44 units BID CBG (last 3)  Recent Labs    08/27/19 2056 08/28/19 0620 08/28/19 1157  GLUCAP 340* 182* 263*   Labile on 9/20, monitor for trend  9/21- BGs much better controlled  9/24- running low 200s- will try to increase lantus slightly  9/26 remains poorly controlled, increase lantus to 47u bid   -change to CM diet 9. CAD/CAF: Monitor for presyncope/syncope as back on Entresto.   Appreciate cards recs, metoprolol decreased  Dig level low on 9/18  Pulse pressure remains wide on 9/20  Heart rate improving on 9/20  9/21- Cards- decr Metoprolol and Digoxin- pt feeling much better   9/25- Digoxin stopped per Cardiology 10 Chronic systolic CHF: Heart healthy diet and monitor for signs of overload. Check weight daily. On Entresto, Demadex, metoprolol and fenofibrate, Vascepa.    Filed Weights  08/26/19 0403 08/27/19 0527 08/29/19 0419  Weight: 112.2 kg 99.2 kg 98.8 kg   ? Reliability on 9/20  9/23- Weight stable since yesterday- not sure if previous weight accurate 11. Urinary retention:   PVRs elevated  Continue Flomax  9/23- voiding "all night" per pt- which was new- didn't require cath o/n  9/26 voiding continently 12. Constipation:Start bowel program as this has been an issues since first surgery.   9/26--bm this morning 13.  Acute blood loss anemia  Transfused 1 unit PRBC on 9/16  Hemoglobin 8.6 on 9/20  Continue to monitor 13. Hyponatremia:   Sodium 135 on 9/17, labs ordered for tomorrow  9/23- Na 133  Continue to monitor 14. OSA: CPAP when  sleeping with 2 L bleed in.  15. Acute on chronic renal failure:   Creatinine 1.66 on 9/17, labs ordered for tomorrow  Encourage fluids  9/21- Cr down to 1.4  9/23- Cr up to 1.69 and BUN up to 68- is dry, Cards held QHS torsemide  9/24 Cr 1.62 and BUN down to 57 after Torsemide held  Continue to monitor 16.  Transaminitis  LFTs elevated on 9/17, labs ordered for tomorrow  9/21- BMP was ordered- will [facility]  9/24- ALT/AST slightly elevated in 40s  Continue to monitor 17. Lethargy/malaise  9/23- U/A (-) for UTI- CBC shows mild leukocytosis; of 11.4k can't just start ABX, unsure as to why- AKA looks OK, per nursing; might be cold/URI (is more nasal); could be dehydration since Cr up and BUN significantly up. Will check labs in AM and monitor more closely.  9/24- resolved   18. Dispo  9/25- will order 15/7 for therapy as of Monday- per OT request.- so pt doesn't do more than 30-45 minutes of therapy max at a time.   LOS: 12 days A FACE TO FACE EVALUATION WAS PERFORMED  Meredith Staggers 08/29/2019, 10:44 AM

## 2019-08-30 ENCOUNTER — Inpatient Hospital Stay (HOSPITAL_COMMUNITY): Payer: Medicare Other

## 2019-08-30 ENCOUNTER — Inpatient Hospital Stay (HOSPITAL_COMMUNITY): Payer: Medicare Other | Admitting: Speech Pathology

## 2019-08-30 LAB — PROTIME-INR
INR: 3.6 — ABNORMAL HIGH (ref 0.8–1.2)
Prothrombin Time: 35.1 seconds — ABNORMAL HIGH (ref 11.4–15.2)

## 2019-08-30 MED ORDER — WARFARIN SODIUM 7.5 MG PO TABS
7.5000 mg | ORAL_TABLET | Freq: Once | ORAL | Status: AC
  Administered 2019-08-30: 18:00:00 7.5 mg via ORAL
  Filled 2019-08-30: qty 1

## 2019-08-30 NOTE — Progress Notes (Signed)
Occupational Therapy Session Note  Patient Details  Name: Joseph Hoffman MRN: [id number] Date of Birth: 08-01-1951  Today's Date: 08/30/2019 OT Individual Time: 3167-4255 OT Individual Time Calculation (min): 29 min    Short Term Goals: Week 1:  OT Short Term Goal 1 (Week 1): Pt will be able to transfer to [facility] and or toilet with mod A using LRAD. OT Short Term Goal 1 - Progress (Week 1): Met OT Short Term Goal 2 (Week 1): Pt will be able to bathe LB with min A. OT Short Term Goal 2 - Progress (Week 1): Met OT Short Term Goal 3 (Week 1): Pt will be able to dress LB with mod A. OT Short Term Goal 3 - Progress (Week 1): Met OT Short Term Goal 4 (Week 1): Pt will be able to maintain static stand for 1 minute with mod A to pull clothing over hips. OT Short Term Goal 4 - Progress (Week 1): Progressing toward goal  Skilled Therapeutic Interventions/Progress Updates:    1:1. Pt received in recliner agreeable to tx and received medicaiton from RN for pain. Pt completes squat pivot transfer from recliner<>w/c with CGA and VC for hand placement. Pt completes 2x12 UB therex for global strengthening with 5# dowel rod to improve BUE strength required for ADLs and functional transfers. Exited session with pt seated in w/c, call light in reach and all needs met  Therapy Documentation Precautions:  Precautions Precautions: Fall Precaution Comments: monitor HR, sats and BP Restrictions Weight Bearing Restrictions: Yes LLE Weight Bearing: Non weight bearing Other Position/Activity Restrictions: L AKA - in ace wrap.  no drainage seen through wrap General:   Vital Signs:  Pain:   ADL: ADL Grooming: Independent Where Assessed-Grooming: Wheelchair Upper Body Bathing: Setup Where Assessed-Upper Body Bathing: Sitting at sink Lower Body Bathing: Contact guard Where Assessed-Lower Body Bathing: Bed level Upper Body Dressing: Minimal assistance Where Assessed-Upper Body Dressing: Bed level Lower  Body Dressing: Contact guard Where Assessed-Lower Body Dressing: Bed level ADL Comments: Pt initially evaluated on 08/06/19 (13 days ago) at a min -mod A level overall. Today due to lethargy and low blood pressure he was not able to fully participate in eval.  min A UB, max - total LB self care bed level. Vision   Perception    Praxis   Exercises:   Other Treatments:     Therapy/Group: Individual Therapy  Tonny Branch 08/30/2019, 1:31 PM

## 2019-08-30 NOTE — Progress Notes (Signed)
[facility] PHYSICAL MEDICINE & REHABILITATION PROGRESS NOTE   Subjective/Complaints:   Pain controlled. CBG 56 this morning.   ROS: Patient denies fever, rash, sore throat, blurred vision, nausea, vomiting, diarrhea, cough, shortness of breath or chest pain,  headache, or mood change.    Objective:   No results found. No results for input(s): WBC, HGB, HCT, PLT in the last 72 hours. No results for input(s): NA, K, CL, CO2, GLUCOSE, BUN, CREATININE, CALCIUM in the last 72 hours.  Intake/Output Summary (Last 24 hours) at 08/30/2019 0837 Last data filed at 08/30/2019 0644 Gross per 24 hour  Intake 240 ml  Output 1100 ml  Net -860 ml     Physical Exam: Vital Signs Blood pressure (!) 117/35, pulse (!) 59, temperature 97.8 F (36.6 C), resp. rate 17, height 5\' 11"  (1.803 m), weight 98.2 kg, SpO2 94 %. Constitutional: No distress . Vital signs reviewed. HEENT: EOMI, oral membranes moist Neck: supple Cardiovascular: RRR without murmur. No JVD    Respiratory: CTA Bilaterally without wheezes or rales. Normal effort    GI: BS +, non-tender, non-distended    Psych: alert and pleasant Musc: Left AKA with edema and tenderness Skin: incision/leg dressed Neurological: Alert Motor: Bilateral upper extremities: 5/5 proximal distal Right lower extremity: 4/5 proximal distal  Left lower extremity: 4+/5 (pain inhibition)    Assessment/Plan: 1. Functional deficits secondary to new L AKA due to ischemia/gangrene which require 3+ hours per day of interdisciplinary therapy in a comprehensive inpatient rehab setting.  Physiatrist is providing close team supervision and 24 hour management of active medical problems listed below.  Physiatrist and rehab team continue to assess barriers to discharge/monitor patient progress toward functional and medical goals  Care Tool:  Bathing    Body parts bathed by patient: Right arm, Left arm, Chest, Abdomen, Front perineal area, Right upper leg, Right  lower leg   Body parts bathed by helper: Buttocks Body parts n/a: Left upper leg, Left lower leg   Bathing assist Assist Level: Minimal Assistance - Patient > 75%     Upper Body Dressing/Undressing Upper body dressing   What is the patient wearing?: Pull over shirt    Upper body assist Assist Level: Set up assist    Lower Body Dressing/Undressing Lower body dressing      What is the patient wearing?: Underwear/pull up, Pants     Lower body assist Assist for lower body dressing: Moderate Assistance - Patient 50 - 74%(sit<>stand at sink)     Chartered loss adjuster assist Assist for toileting: Maximal Assistance - Patient 25 - 49% Assistive Device Comment: bedpan   Transfers Chair/bed transfer  Transfers assist  Chair/bed transfer activity did not occur: Safety/medical concerns  Chair/bed transfer assist level: Minimal Assistance - Patient > 75% Chair/bed transfer assistive device: Sliding board   Locomotion Ambulation   Ambulation assist   Ambulation activity did not occur: Safety/medical concerns(labial BP and HR sitting EOB.)  Assist level: Minimal Assistance - Patient > 75% Assistive device: Parallel bars Max distance: 6'   Walk 10 feet activity   Assist  Walk 10 feet activity did not occur: Safety/medical concerns(labial BP and HR sitting EOB.)        Walk 50 feet activity   Assist Walk 50 feet with 2 turns activity did not occur: Safety/medical concerns(labial BP and HR sitting EOB.)         Walk 150 feet activity   Assist Walk 150 feet activity did not occur: Safety/medical  concerns(labial BP and HR sitting EOB.)         Walk 10 feet on uneven surface  activity   Assist Walk 10 feet on uneven surfaces activity did not occur: Safety/medical concerns(labial BP and HR sitting EOB.)         Wheelchair     Assist Will patient use wheelchair at discharge?: Yes Type of Wheelchair: Manual Wheelchair activity did not  occur: Safety/medical concerns(labial BP and HR sitting EOB.)  Wheelchair assist level: Set up assist, Supervision/Verbal cueing Max wheelchair distance: 150'    Wheelchair 50 feet with 2 turns activity    Assist    Wheelchair 50 feet with 2 turns activity did not occur: Safety/medical concerns(labial BP and HR sitting EOB.)   Assist Level: Set up assist, Supervision/Verbal cueing   Wheelchair 150 feet activity     Assist  Wheelchair 150 feet activity did not occur: Safety/medical concerns(labial BP and HR sitting EOB.)   Assist Level: Set up assist, Supervision/Verbal cueing   Blood pressure (!) 117/35, pulse (!) 59, temperature 97.8 F (36.6 C), resp. rate 17, height 5\' 11"  (1.803 m), weight 98.2 kg, SpO2 94 %.  Medical Problem List and Plan: 1.Fxnl and mobility deficitssecondary to PAD causing left AKA  Cont CIR 2.Mechanical AVR/Antithrombotics: -DVT/anticoagulation:Pharmaceutical:Coumadin and heparin GGT INR> 2.5.  Unfractionated heparin within normal range on 9/20   INR 2.4 on 9/20   9/21- INR 2.6- pharmacy stopped Heparin gtt- will con't coumadin mgmt  9/22- INR 2.9  9/23- INR 3.1- per pharmacy  9/24- INR 3.3- per pharmacy  9/27- INR 3.6- per pharmacy  -antiplatelet therapy: N/A 3. Pain Management:  Scheduled low dose flexeril tid to help with spasms.  May need neurontin titrate upwarrds. Scheduled tramadol 50 mg TID and will use oxy only when needs it.  Reasonable control 9/27 4. Mood:LCSW to follow for evaluation and support. -antipsychotic agents: N/A 5. Neuropsych: This patientiscapable of making decisions on hisown behalf. 6. Skin/Wound Care:Monitor wound daily. Monitor stump for signs and symptoms of infection  Daily dressings, drainage decreased 7. Fluids/Electrolytes/Nutrition:Monitor I/Os 8. T2DM: Monitor BS ac/hs. Continue Lantus 40 mg bid with Victoza  Resumed amaryl 2mg     9/15- increase Lantus to 44 units BID CBG (last 3)  Recent Labs    08/27/19 2056 08/28/19 0620 08/28/19 1157  GLUCAP 340* 182* 263*   Labile on 9/20, monitor for trend  9/21- BGs much better controlled  9/24- running low 200s- will try to increase lantus slightly  9/26- lantus increased to 47u bid   -changed dto CM diet  9/27--add HS snack for am hypoglycemia 9. CAD/CAF: Monitor for presyncope/syncope as back on Entresto.   Appreciate cards recs, metoprolol decreased  Dig level low on 9/18  Pulse pressure remains wide on 9/20  Heart rate improving on 9/20  9/21- Cards- decr Metoprolol and Digoxin- pt feeling much better   9/25- Digoxin stopped per Cardiology 10 Chronic systolic CHF: Heart healthy diet and monitor for signs of overload. Check weight daily. On Entresto, Demadex, metoprolol and fenofibrate, Vascepa.    Filed Weights   08/27/19 0527 08/29/19 0419 08/30/19 0500  Weight: 99.2 kg 98.8 kg 98.2 kg   Weights stable 9/27 11. Urinary retention:   PVRs elevated  Continue Flomax  9/23- voiding "all night" per pt- which was new- didn't require cath o/n  9/26 voiding continently 12. Constipation:Start bowel program as this has been an issues since first surgery.   9/26--bm this morning 13.  Acute blood loss  anemia  Transfused 1 unit PRBC on 9/16  Hemoglobin 8.6 on 9/20  Continue to monitor 13. Hyponatremia:   Sodium 135 on 9/17  9/23- Na 133  Continue to monitor 14. OSA: CPAP when sleeping with 2 L bleed in.  15. Acute on chronic renal failure:   Creatinine 1.66 on 9/17   Encourage fluids  9/21- Cr down to 1.4  9/23- Cr up to 1.69 and BUN up to 68- is dry, Cards held QHS torsemide  9/24 Cr 1.62 and BUN down to 57 after Torsemide held  Continue to monitor 16.  Transaminitis  LFTs elevated on 9/17, labs ordered for tomorrow  9/21- BMP was ordered- will recheck [facility] Thursday  9/24- ALT/AST slightly elevated in 40s  Continue to monitor 17.  Lethargy/malaise  9/23- U/A (-) for UTI- CBC shows mild leukocytosis; of 11.4k can't just start ABX, unsure as to why- AKA looks OK, per nursing; might be cold/URI (is more nasal); could be dehydration since Cr up and BUN significantly up. Will check labs in AM and monitor more closely.  9/24- resolved   18. Dispo  9/25- will order 15/7 for therapy as of Monday- per OT request.- so pt doesn't do more than 30-45 minutes of therapy max at a time.   LOS: 13 days A FACE TO FACE EVALUATION WAS PERFORMED  Meredith Staggers 08/30/2019, 8:37 AM

## 2019-08-30 NOTE — Progress Notes (Signed)
Physical Therapy Session Note  Patient Details  Name: Joseph Hoffman MRN: [id number] Date of Birth: 1951/06/01  Today's Date: 08/30/2019 PT Individual Time: 1100-1210 PT Individual Time Calculation (min): 70 min   Short Term Goals: Week 2:  PT Short Term Goal 1 (Week 2): Patient will perform bed mobility with supervision. PT Short Term Goal 2 (Week 2): Patient will perform sit<>stand with min A using RW. PT Short Term Goal 3 (Week 2): Patient will perform basic transfers with min A. PT Short Term Goal 4 (Week 2): Patient will initiate ambulation with RW.  Skilled Therapeutic Interventions/Progress Updates:    Pt performed bed mobility with supervision to come to EOB - cues for safety. Donned underwear prior to slideboard transfer with overall set up assist using lateral leans to pull up. CGA for slideboard transfer from bed > w/c with cues for safe hand placement and technique. Performed stand pivot transfers with RW w/c <> toilet (BSC over toilet)  With overall min assist and verbal cues for correct hand placement. Performed dynamic standing balance with min assist and PT assisted with hygiene. Request to perform bathing at sink - set up assist and assist for back and bottom hygiene. Discussed and education on d/c planning and overall progress/goals during bathing and dressing tasks. Pt able to don shirt with supervision, assist needed for balance to pull up pants with min assist. Discussed bathroom access per pt questions and showed pictures to PT. Recommending BSC to put over toilet due to not having anything for handles in his current layout unless they installed grab bars. Pt in agreement. Mod I w/c propulsion to and from therapy gym for general strengthening and endurance. Dynamic standing balance activity with RW for 1 UE support while playing cornhole and reaching outside of BOS with RUE. 3 bouts with seated rest breaks between trials due to endurance. Min to CGA overall for balance during  task. End of session performed stand pivot transfer with RW to recliner with overall CGA/min assist and verbal cues for hand placement and technique.   Therapy Documentation Precautions:  Precautions Precautions: Fall Precaution Comments: monitor HR, sats and BP Restrictions Weight Bearing Restrictions: Yes LLE Weight Bearing: Non weight bearing Other Position/Activity Restrictions: L AKA - in ace wrap.  no drainage seen through wrap   Vital Signs: O2 = 95% HR = 55 -85 bpm with activity Pain:  Reports pain in residual limb -    Therapy/Group: Individual Therapy  Canary Brim Ivory Broad, PT, DPT, CBIS  08/30/2019, 12:20 PM

## 2019-08-30 NOTE — Progress Notes (Signed)
ANTICOAGULATION CONSULT NOTE - Follow Up Consult  Pharmacy Consult for warfarin Indication: Mechanical valve  Patient Measurements: Height: 5\' 11"  (180.3 cm) Weight: 216 lb 7.9 oz (98.2 kg) IBW/kg (Calculated) : 75.3  Vital Signs: Temp: 97.8 F (36.6 C) (09/27 0517) BP: 117/35 (09/27 0517) Pulse Rate: 59 (09/27 0517)  Labs: Recent Labs    08/27/19 1030 08/27/19 1228 08/28/19 0634 08/29/19 0556 08/30/19 0528  LABPROT  --   --  35.0* 33.3* 35.1*  INR  --   --  3.6* 3.3* 3.6*  TROPONINIHS 29* 31*  --   --   --      Assessment: 68 year old male on warfarin for mechanical AVR who received INR reversal (Vitamin K 5mg  IV) on 9/9 for an AKA. Patient was on heparin as a bridge, warfarin was resumed per pharmacy dosing on 9/12. PTA warfarin dose: 10 mg daily except 12.5 mg on Tues/Thurs/Sat.  INR now back to slightly supratherapeutic at 3.6 after attempting to resume home dose. CBC stable. No active bleed issues documented.  Goal of Therapy:  INR 2.5-3.5 Monitor platelets by anticoagulation protocol: Yes   Plan:  - Reduce warfarin to 7.5 mg PO x 1 tonight - Appears patient may need lower than previous warfarin dose on discharge - Monitor daily INR, CBC, s/sx bleeding  [name], PharmD, BCPS Please check AMION for all [facility] contact numbers Clinical Pharmacist 08/30/2019 9:49 AM

## 2019-08-30 NOTE — Progress Notes (Addendum)
Hypoglycemic Event  CBG: 56  Treatment: 8 oz juice/soda  Symptoms: None/sweating  Follow-up CBG: [id number] CBG Result:76  Possible Reasons for Event: Unknown  Comments/MD notified:will monitor    Hurbert Duran, [employer]

## 2019-08-31 ENCOUNTER — Inpatient Hospital Stay (HOSPITAL_COMMUNITY): Payer: Medicare Other

## 2019-08-31 ENCOUNTER — Inpatient Hospital Stay (HOSPITAL_COMMUNITY): Payer: Medicare Other | Admitting: Occupational Therapy

## 2019-08-31 ENCOUNTER — Inpatient Hospital Stay (HOSPITAL_COMMUNITY): Payer: Medicare Other | Admitting: Psychology

## 2019-08-31 LAB — BASIC METABOLIC PANEL
Anion gap: 12 (ref 5–15)
BUN: 48 mg/dL — ABNORMAL HIGH (ref 8–23)
CO2: 26 mmol/L (ref 22–32)
Calcium: 9.6 mg/dL (ref 8.9–10.3)
Chloride: 98 mmol/L (ref 98–111)
Creatinine, Ser: 1.59 mg/dL — ABNORMAL HIGH (ref 0.61–1.24)
GFR calc Af Amer: 50 mL/min — ABNORMAL LOW (ref >60)
GFR calc non Af Amer: 43 mL/min — ABNORMAL LOW (ref >60)
Glucose, Bld: 101 mg/dL — ABNORMAL HIGH (ref 70–99)
Potassium: 4 mmol/L (ref 3.5–5.1)
Sodium: 136 mmol/L (ref 135–145)

## 2019-08-31 LAB — CBC WITH DIFFERENTIAL/PLATELET
Abs Immature Granulocytes: 0.09 10*3/uL — ABNORMAL HIGH (ref 0.00–0.07)
Basophils Absolute: 0.1 10*3/uL (ref 0.0–0.1)
Basophils Relative: 1 %
Eosinophils Absolute: 0.6 10*3/uL — ABNORMAL HIGH (ref 0.0–0.5)
Eosinophils Relative: 7 %
HCT: 28.8 % — ABNORMAL LOW (ref 39.0–52.0)
Hemoglobin: 9.1 g/dL — ABNORMAL LOW (ref 13.0–17.0)
Immature Granulocytes: 1 %
Lymphocytes Relative: 9 %
Lymphs Abs: 0.8 10*3/uL (ref 0.7–4.0)
MCH: 26.1 pg (ref 26.0–34.0)
MCHC: 31.6 g/dL (ref 30.0–36.0)
MCV: 82.5 fL (ref 80.0–100.0)
Monocytes Absolute: 0.8 10*3/uL (ref 0.1–1.0)
Monocytes Relative: 9 %
Neutro Abs: 6.8 10*3/uL (ref 1.7–7.7)
Neutrophils Relative %: 73 %
Platelets: 350 10*3/uL (ref 150–400)
RBC: 3.49 MIL/uL — ABNORMAL LOW (ref 4.22–5.81)
RDW: 17.2 % — ABNORMAL HIGH (ref 11.5–15.5)
WBC: 9.3 10*3/uL (ref 4.0–10.5)
nRBC: 0 % (ref 0.0–0.2)

## 2019-08-31 LAB — GLUCOSE, CAPILLARY
Glucose-Capillary: 156 mg/dL — ABNORMAL HIGH (ref 70–99)
Glucose-Capillary: 218 mg/dL — ABNORMAL HIGH (ref 70–99)
Glucose-Capillary: 140 mg/dL — ABNORMAL HIGH (ref 70–99)
Glucose-Capillary: 237 mg/dL — ABNORMAL HIGH (ref 70–99)
Glucose-Capillary: 162 mg/dL — ABNORMAL HIGH (ref 70–99)
Glucose-Capillary: 188 mg/dL — ABNORMAL HIGH (ref 70–99)
Glucose-Capillary: 58 mg/dL — ABNORMAL LOW (ref 70–99)
Glucose-Capillary: 76 mg/dL (ref 70–99)
Glucose-Capillary: 189 mg/dL — ABNORMAL HIGH (ref 70–99)
Glucose-Capillary: 194 mg/dL — ABNORMAL HIGH (ref 70–99)
Glucose-Capillary: 244 mg/dL — ABNORMAL HIGH (ref 70–99)
Glucose-Capillary: 99 mg/dL (ref 70–99)
Glucose-Capillary: 126 mg/dL — ABNORMAL HIGH (ref 70–99)

## 2019-08-31 LAB — PROTIME-INR
INR: 3.2 — ABNORMAL HIGH (ref 0.8–1.2)
Prothrombin Time: 31.9 seconds — ABNORMAL HIGH (ref 11.4–15.2)

## 2019-08-31 MED ORDER — WARFARIN SODIUM 5 MG PO TABS
10.0000 mg | ORAL_TABLET | Freq: Once | ORAL | Status: AC
  Administered 2019-08-31: 18:00:00 10 mg via ORAL
  Filled 2019-08-31: qty 2

## 2019-08-31 NOTE — Progress Notes (Signed)
Physical Therapy Session Note  Patient Details  Name: CAJUN BAYERS MRN: [id number] Date of Birth: 1951/10/27  Today's Date: 08/31/2019 PT Individual Time: 0920-1005 PT Individual Time Calculation (min): 45 min   Short Term Goals: Week 2:  PT Short Term Goal 1 (Week 2): Patient will perform bed mobility with supervision. PT Short Term Goal 2 (Week 2): Patient will perform sit<>stand with min A using RW. PT Short Term Goal 3 (Week 2): Patient will perform basic transfers with min A. PT Short Term Goal 4 (Week 2): Patient will initiate ambulation with RW.  Skilled Therapeutic Interventions/Progress Updates:     Patient in bed requesting to use the bathroom upon PT arrival. Patient alert and agreeable to PT session. Patient reported 8/10 L residual limb pain during session, reported that he had been pre-medicated prior to session, RN made aware. PT provided repositioning, rest breaks, and distraction as pain interventions throughout session. He reported that there have been changes in the plans for his heart surgery, and he appeared anxious and overwhelmed about having to go home then come back for surgery for his heart. PT re-enforced education about requiring sternal precuations and the benefits for improved mobility if the surgery is held off until he has a prosthesis. Patient somewhat calmer after discussion. PT provided education for pursed lipped breathing and relaxation techniques to ease anxiety. Patient reported feeling better after.   Therapeutic Activity: Bed Mobility: Patient performed supine to sit with supervision with heavy use of the bed rail in a flat bed. Provided verbal cues for attempting to push up through his elbows to sitting without use of the bed rail, patient ultimately used the bed rail despite cues.. Transfers: Patient performed sit to/from stand x1 and stand pivot x2 to/from the [facility] over the toilet using the RW with CGA for safety/balance. Provided verbal cues for hand  placement and w/c set up for transfers. Required total A for peri-care and max A for LB dressing to thread LEs in sitting and pull up pants/boxers in standing. He requested to perform bathing during session. PT provided total A for LB bathing and supervision for UE and anterior torso bathing and total A for bathing his back and applying lotion to his back after.   Gait Training:  Patient ambulated 5 feet in the bathroom using RW with CGA-min A for safety. Ambulated with hop-to gait pattern on R with decreased R step length and foot clearance. Provided verbal cues for using UEs to control R foot during swing.  Wheelchair Mobility:  Patient propelled wheelchair ~100 feetx2 with mod I.   Therapeutic Exercise: Patient performed the following exercises with verbal and tactile cues for proper technique. -L SLR in sitting x5 with 10-20 second holds while PT wrapped his residual limb with 2 4" ACE wraps. Educated on wrapping purpose and technique during. Patient reported that his dressing and ACE wraps had not been changed in at least 2 days. PT educated on making sure dressings are changed daily and ACE wraps re-wrapped every 4-6 hours to maintain proper compression for edema control. Patient in agreement, but will benefit from reenforcement. Dr. Dagoberto Ligas made aware during session.   Patient in w/c handed off the Dr. Sima Matas, neuropsychologist, at end of session with breaks locked and all needs within reach.    Therapy Documentation Precautions:  Precautions Precautions: Fall Precaution Comments: monitor HR, sats and BP Restrictions Weight Bearing Restrictions: Yes LLE Weight Bearing: Non weight bearing Other Position/Activity Restrictions: L AKA - in ace  wrap.  no drainage seen through wrap    Therapy/Group: Individual Therapy  Ifeoluwa Beller L Noorah Giammona PT, DPT  08/31/2019, 12:43 PM

## 2019-08-31 NOTE — Progress Notes (Signed)
Occupational Therapy Session Note  Patient Details  Name: Joseph Hoffman MRN: [id number] Date of Birth: [birth year]  Today's Date: 08/31/2019 OT Individual Time: 1300-1345 OT Individual Time Calculation (min): 45 min    Short Term Goals: Week 2:  OT Short Term Goal 1 (Week 2): Pt will be able to transfer to [facility] and or toilet with min A using LRAD. OT Short Term Goal 2 (Week 2): Pt will be able to bathe LB with S OT Short Term Goal 3 (Week 2): Pt will be able to dress LB with min A at sit > stand level OT Short Term Goal 4 (Week 2): Pt will be able to maintain static stand for 1 minute with min A to pull clothing over hips.  Skilled Therapeutic Interventions/Progress Updates:    OT intervention with focus on bed mobility, sit<>stand, standing balance, activity tolerance, and safety awareness to increase independence with BADLs.  Pt's Ace wrap beginning to slip down.  Curlex and ACE wrap applied followed by shrinker.  Pt sat EOB with supervision using bed rails.  Sitting balance at supervision.  Sit<>stand X 4 with CGA fading to supervision.  Static standing balance/tolerance with supervisoin 4 X 45 seconds with extended rest breaks. Sit>supine with supervision.  Kpad applied to L buttocks/hip. Pt remained in bed with bed railsX4 (pt request), bed alarm activated, and all needs within reach.   Therapy Documentation Precautions:  Precautions Precautions: Fall Precaution Comments: monitor HR, sats and BP Restrictions Weight Bearing Restrictions: Yes LLE Weight Bearing: Non weight bearing Other Position/Activity Restrictions: L AKA - in ace wrap.  no drainage seen through wrap  Pain: Pt c/o increased soreness in L buttocks/hip following earlier fall; Kpad applied  Therapy/Group: Individual Therapy  [name] 08/31/2019, 2:28 PM

## 2019-08-31 NOTE — Progress Notes (Signed)
RT placed patient on CPAP HS on auto. 3L O2 bleed in needed. Patient tolerating well at this time.

## 2019-08-31 NOTE — Progress Notes (Signed)
ANTICOAGULATION CONSULT NOTE - Follow Up Consult  Pharmacy Consult for warfarin Indication: Mechanical valve  Patient Measurements: Height: 5\' 11"  (180.3 cm) Weight: 220 lb 7.4 oz (100 kg) IBW/kg (Calculated) : 75.3  Vital Signs: Temp: 97.4 F (36.3 C) (09/28 0417) Temp Source: Oral (09/28 0417) BP: 132/60 (09/28 0417) Pulse Rate: 64 (09/28 0417)  Labs: Recent Labs    08/29/19 0556 08/30/19 0528 08/31/19 0609  HGB  --   --  9.1*  HCT  --   --  28.8*  PLT  --   --  350  LABPROT 33.3* 35.1* 31.9*  INR 3.3* 3.6* 3.2*  CREATININE  --   --  1.59*     Assessment: 68 year old male on warfarin for mechanical AVR who received INR reversal (Vitamin K 5mg  IV) on 9/9 for an AKA. Patient was on heparin as a bridge, warfarin was resumed per pharmacy dosing on 9/12. PTA warfarin dose: 10 mg daily except 12.5 mg on Tues/Thurs/Sat.  INR is now therapeutic at 3.2. CBC is stable and no bleeding noted.   Goal of Therapy:  INR 2.5-3.5 Monitor platelets by anticoagulation protocol: Yes   Plan:  Warfarin 10mg  PO x 1 tonight Daily INR  Salome Arnt, PharmD, BCPS Please see AMION for all pharmacy numbers 08/31/2019 8:22 AM

## 2019-08-31 NOTE — Plan of Care (Signed)
  Problem: RH SKIN INTEGRITY Goal: RH STG SKIN FREE OF INFECTION/BREAKDOWN Description: With min assist Outcome: Progressing Goal: RH STG ABLE TO PERFORM INCISION/WOUND CARE W/ASSISTANCE Description: STG Able To Perform Incision/Wound Care With min Assistance. Outcome: Progressing   Problem: RH SAFETY Goal: RH STG ADHERE TO SAFETY PRECAUTIONS W/ASSISTANCE/DEVICE Description: STG Adhere to Safety Precautions With cues/reminders Assistance/Device. Outcome: Progressing   Problem: RH PAIN MANAGEMENT Goal: RH STG PAIN MANAGED AT OR BELOW PT'S PAIN GOAL Outcome: Not Progressing Note: Pain will be < 4

## 2019-08-31 NOTE — Progress Notes (Signed)
Occupational Therapy Session Note  Patient Details  Name: Joseph Hoffman MRN: [id number] Date of Birth: 10-12-1951  Today's Date: 08/31/2019 OT Individual Time: [id number] OT Individual Time Calculation (min): 60 min    Short Term Goals: Week 2:  OT Short Term Goal 1 (Week 2): Pt will be able to transfer to [facility] and or toilet with min A using LRAD. OT Short Term Goal 2 (Week 2): Pt will be able to bathe LB with S OT Short Term Goal 3 (Week 2): Pt will be able to dress LB with min A at sit > stand level OT Short Term Goal 4 (Week 2): Pt will be able to maintain static stand for 1 minute with min A to pull clothing over hips.    Skilled Therapeutic Interventions/Progress Updates:    Pt greeted in bed, premedicated for pain and eager for extra therapy time. Supervision supine<sit while using his bedrail. Discussed option of purchasing under mattress bedrail for home to increase ease of bed mobility. Min A stand pivot<w/c using RW. Min A sit<stand at the toilet using RW to void bladder. Total A for clothing mgt x2. After handwashing at the sink, he self propelled to the dayroom for B UE strengthening. Continued working on UE strengthening via isometric wall pushes while seated. Attempted to perform w/c push ups but pt was unable to due to left shoulder pain. He tolerated the isometric exercises well with Lt shoulder. Also guided him through gentle UB stretches to balance strengthening with stretching, emphasizing diaphragmatic breathing technique. Pt then self propelled back to the room and completed slideboard transfer<bed. He was able to place and remove board himself given vcs and increased time. Steady assist for scooting with vcs for weight shifting forward. Pt transitioned to supine without assist. Pt remained in bed, verbalizing plan to alternate k-pad between Lt shoulder and residual limb. He was left with all needs and bed alarm set.   Therapy Documentation Precautions:   Precautions Precautions: Fall Precaution Comments: monitor HR, sats and BP Restrictions Weight Bearing Restrictions: Yes LLE Weight Bearing: Non weight bearing Other Position/Activity Restrictions: L AKA - in ace wrap.  no drainage seen through wrap Vital Signs: Therapy Vitals Temp: 98.3 F (36.8 C) Pulse Rate: 69 Resp: 16 BP: (!) 126/51 Patient Position (if appropriate): Sitting Oxygen Therapy SpO2: 97 % O2 Device: Room Air Pain: Pain Assessment Pain Scale: 0-10 Pain Score: 10-Worst pain ever Pain Type: Acute pain Pain Location: Leg Pain Orientation: Left Pain Descriptors / Indicators: Aching;Throbbing Pain Frequency: Constant Pain Onset: On-going Patients Stated Pain Goal: 3 Pain Intervention(s): Medication (See eMAR);Elevated extremity;Emotional support ADL: ADL Grooming: Independent Where Assessed-Grooming: Wheelchair Upper Body Bathing: Setup Where Assessed-Upper Body Bathing: Sitting at sink Lower Body Bathing: Contact guard Where Assessed-Lower Body Bathing: Bed level Upper Body Dressing: Minimal assistance Where Assessed-Upper Body Dressing: Bed level Lower Body Dressing: Contact guard Where Assessed-Lower Body Dressing: Bed level ADL Comments: Pt initially evaluated on 08/06/19 (13 days ago) at a min -mod A level overall. Today due to lethargy and low blood pressure he was not able to fully participate in eval.  min A UB, max - total LB self care bed level.      Therapy/Group: Individual Therapy  Dannika Hilgeman A Kyleeann Cremeans 08/31/2019, 4:22 PM

## 2019-08-31 NOTE — Progress Notes (Signed)
[place] PHYSICAL MEDICINE & REHABILITATION PROGRESS NOTE   Subjective/Complaints:   Pt reports feeling good this AM- no CP or DOE. Discussed needing to get prosthesis before getting CABG because I'm concerned that won't be able to do anything for self with sternal precautions esp that last 8 weeks.  Cardiology agrees, if possible. .   ROS: Patient denies fever, rash, sore throat, blurred vision, nausea, vomiting, diarrhea, cough, shortness of breath or chest pain,  headache, or mood change.    Objective:   No results found. Recent Labs    08/31/19 0609  WBC 9.3  HGB 9.1*  HCT 28.8*  PLT 350   Recent Labs    08/31/19 0609  NA 136  K 4.0  CL 98  CO2 26  GLUCOSE 101*  BUN 48*  CREATININE 1.59*  CALCIUM 9.6    Intake/Output Summary (Last 24 hours) at 08/31/2019 1641 Last data filed at 08/31/2019 1300 Gross per 24 hour  Intake 440 ml  Output 1650 ml  Net -1210 ml     Physical Exam: Vital Signs Blood pressure (!) 126/51, pulse 69, temperature 98.3 F (36.8 C), resp. rate 16, height 5\' 11"  (1.803 m), weight 100 kg, SpO2 97 %. Constitutional: No distress . Vital signs and labs reviewed. Sitting up in bed; brightest he's been in days/affect; good mentation, NAD HEENT: EOMI, oral membranes moist Neck: supple Cardiovascular: RRR    (+click)  Respiratory: CTA Bilaterally without wheezes or rales. Normal effort    GI: BS +, non-tender, non-distended    Psych: alert and pleasant Musc: Left AKA with edema and tenderness- wrapped in ACE wrap Skin: incision/leg dressed Neurological: Alert Motor: Bilateral upper extremities: 5/5 proximal distal Right lower extremity: 4/5 proximal distal  Left lower extremity: 4+/5 (pain inhibition)    Assessment/Plan: 1. Functional deficits secondary to new L AKA due to ischemia/gangrene which require 3+ hours per day of interdisciplinary therapy in a comprehensive inpatient rehab setting.  Physiatrist is providing close team  supervision and 24 hour management of active medical problems listed below.  Physiatrist and rehab team continue to assess barriers to discharge/monitor patient progress toward functional and medical goals  Care Tool:  Bathing    Body parts bathed by patient: Right arm, Left arm, Chest, Abdomen, Front perineal area, Right upper leg, Right lower leg, Face   Body parts bathed by helper: Buttocks Body parts n/a: Left upper leg, Left lower leg   Bathing assist Assist Level: Minimal Assistance - Patient > 75%     Upper Body Dressing/Undressing Upper body dressing   What is the patient wearing?: Pull over shirt    Upper body assist Assist Level: Set up assist    Lower Body Dressing/Undressing Lower body dressing      What is the patient wearing?: Underwear/pull up, Pants     Lower body assist Assist for lower body dressing: Moderate Assistance - Patient 50 - 74%     Toileting Toileting    Toileting assist Assist for toileting: Moderate Assistance - Patient 50 - 74% Assistive Device Comment: bedpan   Transfers Chair/bed transfer  Transfers assist  Chair/bed transfer activity did not occur: Safety/medical concerns  Chair/bed transfer assist level: Contact Guard/Touching assist Chair/bed transfer assistive device: Sliding board   Locomotion Ambulation   Ambulation assist   Ambulation activity did not occur: Safety/medical concerns(labial BP and HR sitting EOB.)  Assist level: Contact Guard/Touching assist Assistive device: Walker-rolling Max distance: 5 feet   Walk 10 feet activity  Assist  Walk 10 feet activity did not occur: Safety/medical concerns(labial BP and HR sitting EOB.)        Walk 50 feet activity   Assist Walk 50 feet with 2 turns activity did not occur: Safety/medical concerns(labial BP and HR sitting EOB.)         Walk 150 feet activity   Assist Walk 150 feet activity did not occur: Safety/medical concerns(labial BP and HR sitting  EOB.)         Walk 10 feet on uneven surface  activity   Assist Walk 10 feet on uneven surfaces activity did not occur: Safety/medical concerns(labial BP and HR sitting EOB.)         Wheelchair     Assist Will patient use wheelchair at discharge?: Yes Type of Wheelchair: Manual Wheelchair activity did not occur: Safety/medical concerns(labial BP and HR sitting EOB.)  Wheelchair assist level: Set up assist, Supervision/Verbal cueing Max wheelchair distance: 100'    Wheelchair 50 feet with 2 turns activity    Assist    Wheelchair 50 feet with 2 turns activity did not occur: Safety/medical concerns(labial BP and HR sitting EOB.)   Assist Level: Set up assist, Supervision/Verbal cueing   Wheelchair 150 feet activity     Assist  Wheelchair 150 feet activity did not occur: Safety/medical concerns(labial BP and HR sitting EOB.)   Assist Level: Independent   Blood pressure (!) 126/51, pulse 69, temperature 98.3 F (36.8 C), resp. rate 16, height 5\' 11"  (1.803 m), weight 100 kg, SpO2 97 %.  Medical Problem List and Plan: 1.Fxnl and mobility deficitssecondary to PAD causing left AKA  Cont CIR 2.Mechanical AVR/Antithrombotics: -DVT/anticoagulation:Pharmaceutical:Coumadin and heparin GGT INR> 2.5.  Unfractionated heparin within normal range on 9/20   INR 2.4 on 9/20   9/21- INR 2.6- pharmacy stopped Heparin gtt- will con't coumadin mgmt  9/22- INR 2.9  9/23- INR 3.1- per pharmacy  9/24- INR 3.3- per pharmacy  9/27- INR 3.6- per pharmacy  9/28- INR 3.2  -antiplatelet therapy: N/A 3. Pain Management:  Scheduled low dose flexeril tid to help with spasms.  May need neurontin titrate upwarrds. Scheduled tramadol 50 mg TID and will use oxy only when needs it.  Reasonable control 9/27 4. Mood:LCSW to follow for evaluation and support. -antipsychotic agents: N/A 5. Neuropsych: This patientiscapable of making  decisions on hisown behalf. 6. Skin/Wound Care:Monitor wound daily. Monitor stump for signs and symptoms of infection  Daily dressings, drainage decreased  9/28- changed ACE wrap to TID per surgeon; ordered retention sock and shrinker for after d/c. 7. Fluids/Electrolytes/Nutrition:Monitor I/Os 8. T2DM: Monitor BS ac/hs. Continue Lantus 40 mg bid with Victoza  Resumed amaryl 2mg    9/15- increase Lantus to 44 units BID CBG (last 3)  Recent Labs    08/30/19 2054 08/31/19 0618 08/31/19 1137  GLUCAP 244* 99 126*   Labile on 9/20, monitor for trend  9/21- BGs much better controlled  9/24- running low 200s- will try to increase lantus slightly  9/26- lantus increased to 47u bid   -changed dto CM diet  9/27--add HS snack for am hypoglycemia 9. CAD/CAF: Monitor for presyncope/syncope as back on Entresto.   Appreciate cards recs, metoprolol decreased  Dig level low on 9/18  Pulse pressure remains wide on 9/20  Heart rate improving on 9/20  9/21- Cards- decr Metoprolol and Digoxin- pt feeling much better   9/25- Digoxin stopped per Cardiology 10 Chronic systolic CHF: Heart healthy diet and monitor for signs of overload. Check  weight daily. On Entresto, Demadex, metoprolol and fenofibrate, Vascepa.    Filed Weights   08/29/19 0419 08/30/19 0500 08/31/19 0417  Weight: 98.8 kg 98.2 kg 100 kg   Weights stable 9/27 11. Urinary retention:   PVRs elevated  Continue Flomax  9/23- voiding "all night" per pt- which was new- didn't require cath o/n  9/26 voiding continently 12. Constipation:Start bowel program as this has been an issues since first surgery.   9/26--bm this morning 13.  Acute blood loss anemia  Transfused 1 unit PRBC on 9/16  Hemoglobin 8.6 on 9/20  Continue to monitor 13. Hyponatremia:   Sodium 135 on 9/17  9/23- Na 133  Continue to monitor 14. OSA: CPAP when sleeping with 2 L bleed in.  15. Acute on chronic renal failure:   Creatinine 1.66 on 9/17    Encourage fluids  9/21- Cr down to 1.4  9/23- Cr up to 1.69 and BUN up to 68- is dry, Cards held QHS torsemide  9/24 Cr 1.62 and BUN down to 57 after Torsemide held  Continue to monitor 16.  Transaminitis  LFTs elevated on 9/17, labs ordered for tomorrow  9/21- BMP was ordered- will recheck [facility] Thursday  9/24- ALT/AST slightly elevated in 40s  Continue to monitor 17. Lethargy/malaise  9/23- U/A (-) for UTI- CBC shows mild leukocytosis; of 11.4k can't just start ABX, unsure as to why- AKA looks OK, per nursing; might be cold/URI (is more nasal); could be dehydration since Cr up and BUN significantly up. Will check labs in AM and monitor more closely.  9/24- resolved   18. Dispo  9/25- will order 15/7 for therapy as of Monday- per OT request.- so pt doesn't do more than 30-45 minutes of therapy max at a time.  9/28- changed to 15/7; Cards agreed shouldn't do CABG, if possible, until gets prosthesis. Will need more assistance- pt aware.  LOS: 14 days A FACE TO FACE EVALUATION WAS PERFORMED  Caterin Tabares 08/31/2019, 4:41 PM

## 2019-08-31 NOTE — Consult Note (Addendum)
Neuropsychological Consultation   Patient:   Joseph Hoffman   DOB:   1951/05/03  MR Number:  [id number]  Location:  [facility] [facility] [place] [id number] [place] [address] Dept: [place]: [phone]           Date of Service:   08/31/2019  Start Time:   9 AM End Time:   10 AM  Provider/Observer:  Ilean Skill, Psy.D.       Clinical Neuropsychologist       Billing Code/Service: 96158/[id number]  Chief Complaint:    Shelvin Dress is a 68 year old male with a history of hypertension, type 2 diabetes, CAF, mechanical AVR, CAD pending CABG, systolic CHF, CKD, OSA with CPAP device and 2 L oxygen, with a left great toe amputation on 07/03/2019 and poor healing.  The patient was admitted on 8/26 with N/V, diarrhea and left foot infection.  He was started on IV antibiotics and underwent left below the knee amputation on 07/31/2019.  Hospital course was complicated by lethargy and confusion there were thought to be secondary to narcotics, hypotension and AKI.  Patient was evaluated and ultimately recommended for the comprehensive rehabilitation unit due to functional decline.  08/24/2019:  Patient had revision to AKA on 08/13/2019 due to signs of ischemia and edema at amputation site.  Patient continuing at times of anxiety and hard to wake after periods of naps/sleep especially in afternoon or evening.  Patient reports times of continued anxiety but that as he gets more familiar with rehab/hospital that the anxiety has improved.  08/31/2019:  Patient reports that with healing and improving pain in leg that his anxiety and worry is also improving.  Patient is getting stronger with more stamina in therapies.  Patient reports still issues with taking care of wife and wife still planning to have knee surgery and patient needing heart surgery later.    Reason for Service:  The patient was referred for neuropsychological  consultation due to coping and adjustment issues following his left below the knee amputation with later revision to AKA.  The patient also had periods of confusion and lethargy initially after the surgery but these appear to have cleared overall.  Below is the HPI for the current admission.  [id number] SHAFT TIA is a 68 year old male with history ofT2DM, CAD, CAF, mechanical AVR- on coumadin,OSA-CPAP with 2 L oxygen at nights, left great toe amputation 07/03/2019 who was admitted with nausea vomiting, poor wound healing and purulent drainage.He was started on IV antibiotics and underwent left BKA on 07/31/2019 by Dr. Carlis Abbott. Hospital course complicated by hypotension with AKI, urinary retention, lethargy with bouts of confusion felt to be due to narcotics as well as subtherapeutic INR requiring heparin/Coumadin bridge. Therapy evaluations done revealing functional deficits and CIR was recommended for follow-up therapy. He was admitted for comprehensive inpatient rehab on 08/05/2019.   He was maintained on IV heparin untilINRgreater than 2.5. Pain control was reasonable with prn use ofoxycodone and Robaxin. Foley was discontinued past admission and he was started on bladder program. He continued to have problems with urinary retention requiring in and out caths at least 4-5 times a day and wasplaced on 1500 cc fluid restriction due tohighPVRs.Laxatives were added to help with OIC. He was noted to develop increasing pain with edema of amputation site on 09/09. Wound showed signs of ischemiaand Dr. Carlis Abbott was consulted for input. Surgical intervention recommended and patient was discharged to acute  hospital for revision of wound to left AKA on zero 9/10 a.m. Postop pain control is improving.He continues to have serosanguineous drainage from amputation site and currently on IV heparin/Coumadin bridge as INR subtherapeutic. Follow-up CBC shows H&H to be relatively stable. Therapy was  resumed and patient continues to have limitations in mobility and ADLs. He was cleared to resume CIR program on 09/14.  Current Status:  Patient continued with good mental status and reduced anxiety.  Working out issues about wife's needs.    Behavioral Observation: COREE HAGENOW  presents as a 68 y.o.-year-old Right Caucasian Male who appeared his stated age. his dress was Appropriate and he was Well Groomed and his manners were Appropriate to the situation.  his participation was indicative of Appropriate and Attentive behaviors.  There were any physical disabilities noted.  he displayed an appropriate level of cooperation and motivation.     Interactions:    Active Appropriate and Attentive  Attention:   within normal limits and attention span and concentration were age appropriate  Memory:   within normal limits; recent and remote memory intact  Visuo-spatial:  not examined  Speech (Volume):  normal  Speech:   normal; normal  Thought Process:  Coherent and Relevant  Though Content:  WNL; not suicidal and not homicidal  Orientation:   person, place, time/date and situation  Judgment:   Good  Planning:   Good  Affect:    Anxious  Mood:    Anxious  Insight:   Good  Intelligence:   normal  Medical History:   Past Medical History:  Diagnosis Date  . Anxiety   . Arthritis   . Asthma   . CAD (coronary artery disease)   . Carotid artery disease ([place])    a. dopplers 06/2019 - Evidence consistent with a total occlusion of the left ICA, 1-39% RICA.  [name] Chronic combined systolic and diastolic CHF (congestive heart failure) ([place])   . CKD (chronic kidney disease), stage IV ([place])   . Diabetic foot ulcer ([place])   . DM (diabetes mellitus) ([place]) 09/30/2012  . Hepatitis B 2003  . Hx of AKA (above knee amputation), left ([employer])   . Hyperlipidemia   . Hypertension   . Myocardial infarction ([place])   . Permanent atrial fibrillation, since 1994   . S/P AVR (aortic valve replacement),  09/30/2012   a. s/p mechanical AVR in 09/2012 (on Coumadin)  . Severe aortic insufficiency   . Sleep apnea    uses CPAP        Psychiatric History:  The patient does have a prior history of anxiety and reports that while initially was quite anxious about his prognosis moving forward and how he would manage after his amputation the patient reports that the symptoms have improved and his confidence and comfort level is been growing.  The patient reports that he has had fears regarding potential infection due to cardiovascular issues which is 1 of the primary reasons why he went ahead and decided as quickly as he did to have the amputation to avoid any type of septic or infectious process that could complicate his cardiovascular status.  Family Med/Psych History:  Family History  Problem Relation Age of Onset  . Hypertension Mother   . Diabetes Father   . Heart attack Brother 41  . Hyperlipidemia Brother 39       stents placed    Impression/DX:  Quinterius Brogan is a 68 year old male with a history of hypertension, type 2 diabetes, CAF,  mechanical AVR, CAD pending CABG, systolic CHF, CKD, OSA with CPAP device and 2 L oxygen, with a left great toe amputation on 07/03/2019 and poor healing.  The patient was admitted on 8/26 with N/V, diarrhea and left foot infection.  He was started on IV antibiotics and underwent left below the knee amputation on 07/31/2019.  Hospital course was complicated by lethargy and confusion there were thought to be secondary to narcotics, hypotension and AKI.  Patient was evaluated and ultimately recommended for the comprehensive rehabilitation unit due to functional decline.  08/24/2019:  Patient had revision to AKA on 08/13/2019 due to signs of ischemia and edema at amputation site.  Patient continuing at times of anxiety and hard to wake after periods of naps/sleep especially in afternoon or evening.  Patient reports times of continued anxiety but that as he gets more familiar  with rehab/hospital that the anxiety has improved.  08/31/2019:  Patient reports that with healing and improving pain in leg that his anxiety and worry is also improving.  Patient is getting stronger with more stamina in therapies.  Patient reports still issues with taking care of wife and wife still planning to have knee surgery and patient needing heart surgery later.   Disposition/Plan:  Will not need to follow-up unless something changes.  Diagnosis:    Left AKA        Electronically Signed   _______________________ Ilean Skill, Psy.D.

## 2019-08-31 NOTE — Progress Notes (Signed)
Progress Note  Patient Name: Joseph Hoffman Date of Encounter: 08/31/2019  Primary Cardiologist: Shelva Majestic, MD   Subjective   Denies any chest pain or dyspnea  Inpatient Medications    Scheduled Meds:  B-complex with vitamin C  1 tablet Oral Daily   bethanechol  10 mg Oral QID   cholecalciferol  1,000 Units Oral Q breakfast   cyclobenzaprine  5 mg Oral TID   docusate sodium  100 mg Oral BID   escitalopram  10 mg Oral QHS   ezetimibe  10 mg Oral Daily   feeding supplement (PRO-STAT SUGAR FREE 64)  30 mL Oral BID   fenofibrate  160 mg Oral Q breakfast   glimepiride  2 mg Oral Q breakfast   hydrocortisone cream   Topical TID   Icosapent Ethyl  2 g Oral BID   insulin aspart  0-20 Units Subcutaneous TID WC   insulin aspart  0-5 Units Subcutaneous QHS   insulin glargine  47 Units Subcutaneous BID   liraglutide  1.8 mg Subcutaneous QHS   metoprolol succinate  12.5 mg Oral Daily   milk and molasses  1 enema Rectal Once   mometasone-formoterol  2 puff Inhalation BID   pantoprazole  40 mg Oral Daily   polyethylene glycol  17 g Oral BID   Ensure Max Protein  11 oz Oral Daily   saccharomyces boulardii  250 mg Oral 3 times weekly   sacubitril-valsartan  1 tablet Oral BID   senna-docusate  2 tablet Oral BID   tamsulosin  0.8 mg Oral QPC supper   torsemide  40 mg Oral Daily   traMADol  50 mg Oral TID   vitamin C  500 mg Oral BID   warfarin  10 mg Oral ONCE-1800   Warfarin - Pharmacist Dosing Inpatient   Does not apply q1800   Continuous Infusions:  PRN Meds: acetaminophen, albuterol, alum & mag hydroxide-simeth, bisacodyl, clonazePAM, guaiFENesin-dextromethorphan, lidocaine, nitroGLYCERIN, nitroGLYCERIN, oxyCODONE, polyethylene glycol, prochlorperazine **OR** prochlorperazine **OR** prochlorperazine, sodium chloride flush, sodium phosphate, traZODone   Vital Signs    Vitals:   08/30/19 2058 08/30/19 2319 08/31/19 0417 08/31/19 0858  BP: (!)  116/52  132/60   Pulse: 65  64   Resp: 16 16    Temp: 98.2 F (36.8 C)  (!) 97.4 F (36.3 C)   TempSrc: Oral  Oral   SpO2: 97%  99% 98%  Weight:   100 kg   Height:        Intake/Output Summary (Last 24 hours) at 08/31/2019 1005 Last data filed at 08/31/2019 0846 Gross per 24 hour  Intake 320 ml  Output 1625 ml  Net -1305 ml   Last 3 Weights 08/31/2019 08/30/2019 08/29/2019  Weight (lbs) 220 lb 7.4 oz 216 lb 7.9 oz 217 lb 13 oz  Weight (kg) 100 kg 98.2 kg 98.8 kg      Telemetry    n/a - Personally Reviewed  ECG    n/a - Personally Reviewed  Physical Exam   GEN: No acute distress.   Neck: No JVD Cardiac: RRR, 3/6 diastolic murmur rusb Respiratory: Clear to auscultation bilaterally. GI: Soft, nontender, non-distended  MS: No edema; No deformity. Neuro:  Nonfocal  Psych: Normal affect   Labs    High Sensitivity Troponin:   Recent Labs  Lab 08/27/19 1030 08/27/19 1228  TROPONINIHS 29* 31*      Chemistry Recent Labs  Lab 08/26/19 1014 08/27/19 0536 08/31/19 0609  NA 133* 138 136  K 4.6 4.3 4.0  CL 100 101 98  CO2 23 25 26   GLUCOSE 236* 188* 101*  BUN 68* 57* 48*  CREATININE 1.69* 1.62* 1.59*  CALCIUM 9.3 9.6 9.6  PROT 6.4* 6.7  --   ALBUMIN 2.8* 3.0*  --   AST 30 38  --   ALT 37 36  --   ALKPHOS 43 45  --   BILITOT 0.8 0.6  --   GFRNONAA 40* 42* 43*  GFRAA 47* 49* 50*  ANIONGAP 10 12 12      Hematology Recent Labs  Lab 08/26/19 1014 08/27/19 0536 08/31/19 0609  WBC 11.4* 9.8 9.3  RBC 3.12* 3.48* 3.49*  HGB 8.5* 9.3* 9.1*  HCT 25.9* 29.1* 28.8*  MCV 83.0 83.6 82.5  MCH 27.2 26.7 26.1  MCHC 32.8 32.0 31.6  RDW 17.5* 17.2* 17.2*  PLT 361 378 350    BNPNo results for input(s): BNP, PROBNP in the last 168 hours.   DDimer No results for input(s): DDIMER in the last 168 hours.   Radiology    No results found.  Cardiac Studies   Cath 06/29/19: Conclusions: 1. Multivessel coronary artery disease, including 50% mid LAD, 80% large OM2,  50% mid RCA, and 80-90% ostial rPDA stenoses. 2. Mildly to moderately elevated left and right heart filling pressures. 3. Mildly reduced Fick cardiac output/index.  TEE 05/29/19:  1. The left ventricle has moderate-severely reduced systolic function, with an ejection fraction of 30-35%. Left ventricular diffuse hypokinesis.  2. The right ventricle has severely reduced systolic function. The cavity was mildly enlarged.  3. Left atrial size was severely dilated.  4. Right atrial size was severely dilated.  5. The mitral valve is grossly normal. No evidence of mitral valve stenosis.  6. The tricuspid valve was grossly normal.  7. Aortic valve regurgitation is severe by color flow Doppler.  8. There is evidence of mild plaque in the descending aorta.  9. Moderate to severe global reduction in LV systolic function; mild RVE with severe RV dysfunction; severe biatrial enlargement; s/p mechanical AVR with mean gradient of 14 mmHg (both leaflets appear to be mobile); severe perivalvular AI; mild MR and  TR.  TTE 04/10/19:  1. The left ventricle has mildly reduced systolic function, with an ejection fraction of 45-50%. The cavity size was normal. There is moderately increased left ventricular wall thickness. Left ventricular diastolic function could not be evaluated  secondary to atrial fibrillation. Elevated left atrial and left ventricular end-diastolic pressures.  2. The right ventricle has mildly reduced systolic function. The cavity was moderately enlarged. There is no increase in right ventricular wall thickness.  3. Left atrial size was severely dilated.  4. Right atrial size was severely dilated.  5. The mitral valve is abnormal. Mild thickening of the mitral valve leaflet. Mild calcification of the mitral valve leaflet. There is mild mitral annular calcification present. Mitral valve regurgitation is mild to moderate by color flow Doppler.  Mild-moderate mitral valve stenosis.  6. The tricuspid  valve is grossly normal.  7. Moderate calcification of the aortic valve. Aortic valve regurgitation is mild by color flow Doppler. Moderate stenosis of the aortic valve.  8. The inferior vena cava was dilated in size with <50% respiratory variability.  Patient Profile     68 y.o. male with permanent atrial fibrillation, chronic systolic and diastolic heart failure, mechanical AVR in 2013, severe perivalvular AR, hypertension, hyperlipidemia (intolerant of statins) OSA on CPAP, diabetes, CKD 4, and hepatitis  Assessment &  Plan   1. Chronic biventricular failure: ECHO 05/2019 EF 30-35% Severe AI, severe RV dysfunction - medical therapy with toprol 12.5mg , entresto 24/26mg  bid, torsemide 40mg  daily - avoid higher beta blocker dose due to severe AI, want to have higher HRs and less diastole - appears euvolemic, continue current meds  2.  CAD: LHC 06/29/19: Multivessel coronary disease, 50% mid LAD, 80% large OM2, 50% mid RCA, and 80-90% ostial rPDA stenoses.  - possible CABG at time of AVR in the future  3. Severe Aortic insuffiency in setting of mechanical AVR: H/O Mechanical AVR 2014 [facility]  - pending re-do high-risk AVR +/- CABG when other medical issues stable. - possible AVR once recovered from AKA - stable and normal SBP, wide pulse pressure with low DBP related to severe AI - on warfarin, INR therapeutic  5. Chronic A fib  - Rate controlled on Toprol XL  6. CKD Stage IV  - Creatinine stable, 1.59 this admission  7. Diabetic foot ulcer:  S/p toe amputation 06/2019 followed by BKA 8/28 followed by AKA 08/2019.  Admitted to CIR.    For questions or updates, please contact [place] Please consult [email or web address] for contact info under        Signed, Donato Heinz, MD  08/31/2019, 10:05 AM

## 2019-08-31 NOTE — Progress Notes (Signed)
   08/31/19 1318  What Happened  Was fall witnessed? Yes  Who witnessed fall? Dietrich Pates, RN  Patients activity before fall other (comment) (Standing at bedside to use Urinal )  Point of contact buttocks  Was patient injured? Yes (Laceration to 3RD Right Digit )  Follow Up  MD notified Reesa Chew, PA  Time MD notified [phone]  Additional tests No  Simple treatment Other (comment) (Band-aid )  Progress note created (see row info) Yes  Adult Fall Risk Assessment  Risk Factor Category (scoring not indicated) Fall has occurred during this admission (document High fall risk);High fall risk per protocol (document High fall risk)  Age 68  Fall History: Fall within 6 months prior to admission 5  Elimination; Bowel and/or Urine Incontinence 0  Elimination; Bowel and/or Urine Urgency/Frequency 0  Medications: includes PCA/Opiates, Anti-convulsants, Anti-hypertensives, Diuretics, Hypnotics, Laxatives, Sedatives, and Psychotropics 5  Patient Care Equipment 1  Mobility-Assistance 2  Mobility-Gait 2  Mobility-Sensory Deficit 0  Altered awareness of immediate physical environment 0  Impulsiveness 0  Lack of understanding of one's physical/cognitive limitations 0  Total Score 17  Patient Fall Risk Level High fall risk  Adult Fall Risk Interventions  Required Bundle Interventions *See Row Information* High fall risk - low, moderate, and high requirements implemented  Additional Interventions Use of appropriate toileting equipment (bedpan, BSC, etc.)  Screening for Fall Injury Risk (To be completed on HIGH fall risk patients) - Assessing Need for Low Bed  Risk For Fall Injury- Low Bed Criteria None identified - Continue screening  Will Implement Low Bed and Floor Mats Low bed contraindicated, floor mats in place  Screening for Fall Injury Risk (To be completed on HIGH fall risk patients who do not meet crieteria for Low Bed) - Assessing Need for Floor Mats Only  Risk For Fall Injury- Criteria  for Floor Mats Bleeding risk-anticoagulation (not prophylaxis)  Will Implement Floor Mats Yes  Neurological  Neuro (WDL) X  Level of Consciousness Alert  Orientation Level Oriented X4  Cognition Appropriate at baseline  Speech Clear

## 2019-08-31 NOTE — Progress Notes (Signed)
Orthopedic Tech Progress Note Patient Details:  Joseph Hoffman [birth year] [id number]  Patient ID: Joseph Hoffman, male   DOB: 1951/04/23, 68 y.o.   MRN: [id number]   Joseph Hoffman 08/31/2019, 10:31 [facility] [employer] for left Retention sock.

## 2019-09-01 ENCOUNTER — Inpatient Hospital Stay (HOSPITAL_COMMUNITY): Payer: Medicare Other

## 2019-09-01 ENCOUNTER — Telehealth: Payer: Self-pay | Admitting: *Deleted

## 2019-09-01 ENCOUNTER — Inpatient Hospital Stay (HOSPITAL_COMMUNITY): Payer: Medicare Other | Admitting: Occupational Therapy

## 2019-09-01 LAB — PROTIME-INR
INR: 2.7 — ABNORMAL HIGH (ref 0.8–1.2)
Prothrombin Time: 28.5 seconds — ABNORMAL HIGH (ref 11.4–15.2)

## 2019-09-01 LAB — GLUCOSE, CAPILLARY
Glucose-Capillary: 62 mg/dL — ABNORMAL LOW (ref 70–99)
Glucose-Capillary: 75 mg/dL (ref 70–99)
Glucose-Capillary: 95 mg/dL (ref 70–99)
Glucose-Capillary: 97 mg/dL (ref 70–99)
Glucose-Capillary: 254 mg/dL — ABNORMAL HIGH (ref 70–99)

## 2019-09-01 NOTE — Progress Notes (Signed)
[place] PHYSICAL MEDICINE & REHABILITATION PROGRESS NOTE   Subjective/Complaints:   Pt reports spoke with wife- she has scheduled TKR 3rd week of October- is worried will need to have CABG earlier than should because wife said it takes 6 months to get prosthesis- explained that's for permanent prosthesis usually- gets temporary earlier than that. Explained I'm happy to speak with wife about this issue - gave him my office number for her ot call if needed.    ROS: Patient denies fever, rash, sore throat, blurred vision, nausea, vomiting, diarrhea, cough, shortness of breath or chest pain,  headache, or mood change.    Objective:   No results found. Recent Labs    08/31/19 0609  WBC 9.3  HGB 9.1*  HCT 28.8*  PLT 350   Recent Labs    08/31/19 0609  NA 136  K 4.0  CL 98  CO2 26  GLUCOSE 101*  BUN 48*  CREATININE 1.59*  CALCIUM 9.6    Intake/Output Summary (Last 24 hours) at 09/01/2019 1303 Last data filed at 09/01/2019 0757 Gross per 24 hour  Intake 720 ml  Output 1500 ml  Net -780 ml     Physical Exam: Vital Signs Blood pressure (!) 130/45, pulse 78, temperature 97.6 F (36.4 C), resp. rate 18, height 5\' 11"  (1.803 m), weight 100.4 kg, SpO2 100 %. Constitutional: No distress . Vital signs and labs and pharmacy notes reviewed. Sitting up in bed; nursing and PT at bedside; bright affect; Ox3, NAD HEENT: EOMI, oral membranes moist Neck: supple Cardiovascular: RRR    (+click)  Respiratory: CTA Bilaterally without wheezes or rales. Normal effort    GI: BS +, non-tender, non-distended    Psych: alert and pleasant Musc: Left AKA with edema and tenderness- wrapped in ACE wrap Skin: incision/leg dressed Neurological: Alert Motor: Bilateral upper extremities: 5/5 proximal distal Right lower extremity: 4/5 proximal distal  Left lower extremity: 4+/5 (pain inhibition)    Assessment/Plan: 1. Functional deficits secondary to new L AKA due to ischemia/gangrene which  require 3+ hours per day of interdisciplinary therapy in a comprehensive inpatient rehab setting.  Physiatrist is providing close team supervision and 24 hour management of active medical problems listed below.  Physiatrist and rehab team continue to assess barriers to discharge/monitor patient progress toward functional and medical goals  Care Tool:  Bathing    Body parts bathed by patient: Right arm, Left arm, Chest, Abdomen, Front perineal area, Right upper leg, Right lower leg, Face   Body parts bathed by helper: Buttocks Body parts n/a: Left upper leg, Left lower leg   Bathing assist Assist Level: Minimal Assistance - Patient > 75%     Upper Body Dressing/Undressing Upper body dressing   What is the patient wearing?: Pull over shirt    Upper body assist Assist Level: Set up assist    Lower Body Dressing/Undressing Lower body dressing      What is the patient wearing?: Underwear/pull up, Pants     Lower body assist Assist for lower body dressing: Minimal Assistance - Patient > 75%     Toileting Toileting    Toileting assist Assist for toileting: Moderate Assistance - Patient 50 - 74% Assistive Device Comment: bedpan   Transfers Chair/bed transfer  Transfers assist  Chair/bed transfer activity did not occur: Safety/medical concerns  Chair/bed transfer assist level: Contact Guard/Touching assist Chair/bed transfer assistive device: Sliding board   Locomotion Ambulation   Ambulation assist   Ambulation activity did not occur: Safety/medical concerns(labial  BP and HR sitting EOB.)  Assist level: Contact Guard/Touching assist Assistive device: Walker-rolling Max distance: 5 feet   Walk 10 feet activity   Assist  Walk 10 feet activity did not occur: Safety/medical concerns(labial BP and HR sitting EOB.)        Walk 50 feet activity   Assist Walk 50 feet with 2 turns activity did not occur: Safety/medical concerns(labial BP and HR sitting  EOB.)         Walk 150 feet activity   Assist Walk 150 feet activity did not occur: Safety/medical concerns(labial BP and HR sitting EOB.)         Walk 10 feet on uneven surface  activity   Assist Walk 10 feet on uneven surfaces activity did not occur: Safety/medical concerns(labial BP and HR sitting EOB.)         Wheelchair     Assist Will patient use wheelchair at discharge?: Yes Type of Wheelchair: Manual Wheelchair activity did not occur: Safety/medical concerns(labial BP and HR sitting EOB.)  Wheelchair assist level: Set up assist, Supervision/Verbal cueing Max wheelchair distance: 100'    Wheelchair 50 feet with 2 turns activity    Assist    Wheelchair 50 feet with 2 turns activity did not occur: Safety/medical concerns(labial BP and HR sitting EOB.)   Assist Level: Set up assist, Supervision/Verbal cueing   Wheelchair 150 feet activity     Assist  Wheelchair 150 feet activity did not occur: Safety/medical concerns(labial BP and HR sitting EOB.)   Assist Level: Independent   Blood pressure (!) 130/45, pulse 78, temperature 97.6 F (36.4 C), resp. rate 18, height 5\' 11"  (1.803 m), weight 100.4 kg, SpO2 100 %.  Medical Problem List and Plan: 1.Fxnl and mobility deficitssecondary to PAD causing left AKA  Cont CIR 2.Mechanical AVR/Antithrombotics: -DVT/anticoagulation:Pharmaceutical:Coumadin and heparin GGT INR> 2.5.  Unfractionated heparin within normal range on 9/20   INR 2.4 on 9/20   9/21- INR 2.6- pharmacy stopped Heparin gtt- will con't coumadin mgmt  9/22- INR 2.9  9/23- INR 3.1- per pharmacy  9/24- INR 3.3- per pharmacy  9/27- INR 3.6- per pharmacy  9/28- INR 3.2  9/29- INR 2.7  -antiplatelet therapy: N/A 3. Pain Management:  Scheduled low dose flexeril tid to help with spasms.  May need neurontin titrate upwarrds. Scheduled tramadol 50 mg TID and will use oxy only when needs  it.  Reasonable control 9/27 4. Mood:LCSW to follow for evaluation and support. -antipsychotic agents: N/A 5. Neuropsych: This patientiscapable of making decisions on hisown behalf. 6. Skin/Wound Care:Monitor wound daily. Monitor stump for signs and symptoms of infection  Daily dressings, drainage decreased  9/28- changed ACE wrap to TID per surgeon; ordered retention sock and shrinker for after d/c. 7. Fluids/Electrolytes/Nutrition:Monitor I/Os 8. T2DM: Monitor BS ac/hs. Continue Lantus 40 mg bid with Victoza  Resumed amaryl 2mg    9/15- increase Lantus to 44 units BID CBG (last 3)  Recent Labs    08/31/19 1651 08/31/19 2129 09/01/19 0625  GLUCAP 75 95 97   Labile on 9/20, monitor for trend  9/21- BGs much better controlled  9/24- running low 200s- will try to increase lantus slightly  9/26- lantus increased to 47u bid   -changed dto CM diet  9/27--add HS snack for am hypoglycemia  9/29- bgS 75-95- actually great control- will monitor 9. CAD/CAF: Monitor for presyncope/syncope as back on Entresto.   Appreciate cards recs, metoprolol decreased  Dig level low on 9/18  Pulse pressure remains wide on  9/20  Heart rate improving on 9/20  9/21- Cards- decr Metoprolol and Digoxin- pt feeling much better   9/25- Digoxin stopped per Cardiology 10 Chronic systolic CHF: Heart healthy diet and monitor for signs of overload. Check weight daily. On Entresto, Demadex, metoprolol and fenofibrate, Vascepa.    Filed Weights   08/30/19 0500 08/31/19 0417 09/01/19 0543  Weight: 98.2 kg 100 kg 100.4 kg   Weights stable 9/27 11. Urinary retention:   PVRs elevated  Continue Flomax  9/23- voiding "all night" per pt- which was new- didn't require cath o/n  9/26 voiding continently 12. Constipation:Start bowel program as this has been an issues since first surgery.   9/26--bm this morning 13.  Acute blood loss anemia  Transfused 1 unit PRBC on 9/16  Hemoglobin  8.6 on 9/20  Continue to monitor 13. Hyponatremia:   Sodium 135 on 9/17  9/23- Na 133  Continue to monitor 14. OSA: CPAP when sleeping with 2 L bleed in.  15. Acute on chronic renal failure:   Creatinine 1.66 on 9/17   Encourage fluids  9/21- Cr down to 1.4  9/23- Cr up to 1.69 and BUN up to 68- is dry, Cards held QHS torsemide  9/24 Cr 1.62 and BUN down to 57 after Torsemide held  9/29- Cr 1.59 and BUN 48 -holding steady.  Continue to monitor 16.  Transaminitis  LFTs elevated on 9/17, labs ordered for tomorrow  9/21- BMP was ordered- will recheck [facility] Thursday  9/24- ALT/AST slightly elevated in 40s  Continue to monitor 17. Lethargy/malaise  9/23- U/A (-) for UTI- CBC shows mild leukocytosis; of 11.4k can't just start ABX, unsure as to why- AKA looks OK, per nursing; might be cold/URI (is more nasal); could be dehydration since Cr up and BUN significantly up. Will check labs in AM and monitor more closely.  9/24- resolved   18. Dispo  9/25- will order 15/7 for therapy as of Monday- per OT request.- so pt doesn't do more than 30-45 minutes of therapy max at a time.  9/28- changed to 15/7; Cards agreed shouldn't do CABG, if possible, until gets prosthesis. Will need more assistance- pt aware.  9/29- discussed prosthesis issue with pt- happy to speak with wife if needed- left my office number.  LOS: 15 days A FACE TO FACE EVALUATION WAS PERFORMED  Meliss Fleek 09/01/2019, 1:03 PM

## 2019-09-01 NOTE — Telephone Encounter (Signed)
Patients wife would like to speak with Dr. Dagoberto Ligas about her husbands heart condition.  Social work gave her our clinic number with hopes of being able to speak directly to Dr. Dagoberto Ligas.  I informed she will not be in clinic till tomorrow 09/02/2019. I told her Iwould leave this message in the communication queue with the hope that Dr. Dagoberto Ligas will contact her.

## 2019-09-01 NOTE — Progress Notes (Signed)
Occupational Therapy Session Note  Patient Details  Name: Joseph Hoffman MRN: [id number] Date of Birth: 15-Apr-1951  Today's Date: 09/01/2019 OT Individual Time: [id number] OT Individual Time Calculation (min): 48 min    Short Term Goals: Week 2:  OT Short Term Goal 1 (Week 2): Pt will be able to transfer to [facility] and or toilet with min A using LRAD. OT Short Term Goal 2 (Week 2): Pt will be able to bathe LB with S OT Short Term Goal 3 (Week 2): Pt will be able to dress LB with min A at sit > stand level OT Short Term Goal 4 (Week 2): Pt will be able to maintain static stand for 1 minute with min A to pull clothing over hips.  Skilled Therapeutic Interventions/Progress Updates:    Treatment session with focus on self-care retraining with lateral leans and sit <> stand.  Pt received in bed expressing desire to engage in bathing/dressing.  Pt donned boxer shorts seated EOB with lateral leans to pull over hips.  Completed slide board transfer to w/c with therapist setup of slide board and then CGA during transfer.  Engaged in grooming tasks and UB bathing with setup assist, required min assist for LB bathing with washing buttocks while pt maintained standing balance with UE support on RW.  Pt required assistance with donning sock, but able to thread shorts with supervision and then min assist to pull Lt side of shorts over hip while pt maintained standing.  CGA to Min assist with sit > stand at RW during LB bathing and dressing.  Returned to bed via slide board and left semi-reclined in bed with all needs in reach.  Therapy Documentation Precautions:  Precautions Precautions: Fall Precaution Comments: monitor HR, sats and BP Restrictions Weight Bearing Restrictions: Yes LLE Weight Bearing: Non weight bearing Other Position/Activity Restrictions: L AKA - in ace wrap.  no drainage seen through wrap Pain:  Pt with no c/o pain   Therapy/Group: Individual Therapy  Simonne Come 09/01/2019, 12:58  PM

## 2019-09-01 NOTE — Progress Notes (Signed)
Occupational Therapy Session Note  Patient Details  Name: Joseph Hoffman MRN: [id number] Date of Birth: Jun 19, 1951  Today's Date: 09/01/2019 OT Individual Time: 1300-1355 OT Individual Time Calculation (min): 55 min    Short Term Goals: Week 2:  OT Short Term Goal 1 (Week 2): Pt will be able to transfer to [facility] and or toilet with min A using LRAD. OT Short Term Goal 2 (Week 2): Pt will be able to bathe LB with S OT Short Term Goal 3 (Week 2): Pt will be able to dress LB with min A at sit > stand level OT Short Term Goal 4 (Week 2): Pt will be able to maintain static stand for 1 minute with min A to pull clothing over hips.  Skilled Therapeutic Interventions/Progress Updates:    Pt sitting on toilet upon arrival with NT present.  Pt completed toileting (mod A for clothing management) and transferred to w/c with min A.  Pt propelled w/c to gym. OT intervention with focus on functional transfers, sit<>stand, standing balance, safety awareness, and activity tolerance to increase independence with BADLs. Pt performed scoot transfers w/c<>mat with supervision.  Pt performed sit<>stand from mat X 3 with CGA to retrieve horseshoes and toss at target-min A for balance.  During third trial, pt c/o dizziness and sat back on mat.  BP 97/48 HR 56, after 5 min rest BP 106/43 HR 73. PA present. Pt returned to room and performed sliding board transfer to bed with CGA. Pt remained in bed with all needs within reach and bed alarm activated.   Therapy Documentation Precautions:  Precautions Precautions: Fall Precaution Comments: monitor HR, sats and BP Restrictions Weight Bearing Restrictions: Yes LLE Weight Bearing: Non weight bearing Other Position/Activity Restrictions: L AKA - in ace wrap.  no drainage seen through wrap Pain:  Pt denies pain this afternoon  Therapy/Group: Individual Therapy  [name] 09/01/2019, 2:43 PM

## 2019-09-01 NOTE — Progress Notes (Signed)
Physical Therapy Weekly Progress Note  Patient Details  Name: Joseph Hoffman MRN: [id number] Date of Birth: Oct 07, 1951  Beginning of progress report period: August 25, 2019 End of progress report period: September 01, 2019  Today's Date: 09/01/2019 PT Individual Time: 5003-7048 PT Individual Time Calculation (min): 30 min   Patient has met 3 of 4 short term goals.  He is progressing well with therapy this week with improved functional mobility. He currently requires min A-supervision with bed mobility in a flat bed without rails, min A-CGA for basic transfers, and CGA for ambulation up to 12 feet with the RW.   Patient continues to demonstrate the following deficits muscle weakness and muscle joint tightness, decreased cardiorespiratoy endurance and decreased sitting balance, decreased standing balance, decreased postural control and decreased balance strategies and therefore will continue to benefit from skilled PT intervention to increase functional independence with mobility.  Patient progressing toward long term goals..  Continue plan of care.  PT Short Term Goals Week 2:  PT Short Term Goal 1 (Week 2): Patient will perform bed mobility with supervision. PT Short Term Goal 1 - Progress (Week 2): Progressing toward goal PT Short Term Goal 2 (Week 2): Patient will perform sit<>stand with min A using RW. PT Short Term Goal 2 - Progress (Week 2): Met PT Short Term Goal 3 (Week 2): Patient will perform basic transfers with min A. PT Short Term Goal 3 - Progress (Week 2): Met PT Short Term Goal 4 (Week 2): Patient will initiate ambulation with RW. PT Short Term Goal 4 - Progress (Week 2): Met Week 3:  PT Short Term Goal 1 (Week 3): Patient will perform bed mobility with supervision. PT Short Term Goal 2 (Week 3): Patient will be independent with HEP. PT Short Term Goal 3 (Week 3): Patient will perform level transfers with CGA. PT Short Term Goal 4 (Week 3): Patient will perform unlevel  transfers with min A. PT Short Term Goal 5 (Week 3): Patient will ambulate 15 feet with CGA using LRAD.  Skilled Therapeutic Interventions/Progress Updates:     Session 1: Patient in bed upon PT arrival. Patient alert and agreeable to PT session. Patient reported 0-1/10 residual limb pain during session. PT provided repositioning, rest breaks, and distraction as pain interventions throughout session. Patient requested to use the restroom at beginning of session. Patient also expressed concern about his staples being removed before he leaves and receiving his shrinker prior to d/c. PT will discuss with team during team conference tomorrow. Session focused on dressing, toileting, and basic transfers.  Therapeutic Activity: Bed Mobility: Patient performed supine to sit with min A-CGA in a flat bed without rails. Provided verbal cues for rolling to his R side and setting his R elbow to push up to his elbow then his hand to sit up. He performed sit to supine with supervision at end of session.  Transfers: Patient performed stand pivot transfers x2, bed>w/c and w/c<>BSC over the toilet, with CGA. Provided verbal cues for sequencing and technique for stepping and pivoting with R foot. Required mod A for LB dressing holding on to the RW in standing during toileting.    Wheelchair Mobility:  Patient propelled wheelchair 15 feet to/from the bathroom with supervision and min A to push up the raked enterence. Provided verbal cues for leaning forward when pushing into the bathroom.  Patient was able to teach back ACE wrapping schedule, every 4-6 hours or 3x per day, and dressing changes, 1x per day. Educated  on advocating for re-wrapping schedule througout the day. Patient in agreement.   Patient in w/c at end of session with breaks locked, seat belt alarm set, and all needs within reach.   Session 2: Patient in bed upon PT arrival. Patient alert and agreeable to PT session. Patient denied pain this session.  Patient reported that his dressing was not yet changed and that his ACE wrap had not been re-wrapped since yesterday. PT re-educated on importance of self-advocacy for management of his residual limb, as he currently requires assistance with dressing changes and wrapping. Patient in agreement, PT changed dressing and re-wrapped his residual limb at end of session, RN made aware. Patient able to recall 3/4 key points for wrapping using tech back method.   Therapeutic Activity: Bed Mobility: Patient performed supine to/from sit as described above. Required same cues for sitting up with less assistance this afternoon. Transfers: Patient performed sit to/from stand x1 in the // bars and x1 using the RW with CGA. Patient used teach-back method for hand placement on RW. Provided cues for reaching back to sit to control descent.   Gait Training:  Patient ambulated 6 feet in the // bars and 12 feet using RW with CGA. Ambulated with hop-to gait pattern on R with moderate impact at initial contact. Provided verbal cues and demonstration for use of UEs to control impact on R foot during gait and educated on stopping to sit when UEs fatigue to reduce increased impact on his R foot.  Wheelchair Mobility:  Patient propelled wheelchair ~150 feet x2 with increased time due to decreased strength/activity tolerance with mod I. Provided verbal cues for use of his R LE to assist when his UEs fatigue.  Patient in bed at end of session with breaks locked, bed alarm set, and all needs within reach.    Therapy Documentation Precautions:  Precautions Precautions: Fall Precaution Comments: monitor HR, sats and BP Restrictions Weight Bearing Restrictions: Yes LLE Weight Bearing: Non weight bearing Other Position/Activity Restrictions: L AKA - in ace wrap.  no drainage seen through wrap   Therapy/Group: Individual Therapy  Marquez Ceesay L Chauncey Sciulli PT, DPT  09/01/2019, 4:11 PM

## 2019-09-01 NOTE — Progress Notes (Signed)
ANTICOAGULATION CONSULT NOTE - Follow Up Consult  Pharmacy Consult for warfarin Indication: Mechanical valve  Patient Measurements: Height: 5\' 11"  (180.3 cm) Weight: 221 lb 5.5 oz (100.4 kg) IBW/kg (Calculated) : 75.3  Vital Signs: Temp: 97.6 F (36.4 C) (09/29 0543) BP: 130/45 (09/29 0543) Pulse Rate: 78 (09/29 0845)  Labs: Recent Labs    08/30/19 0528 08/31/19 0609 09/01/19 0826  HGB  --  9.1*  --   HCT  --  28.8*  --   PLT  --  350  --   LABPROT 35.1* 31.9* 28.5*  INR 3.6* 3.2* 2.7*  CREATININE  --  1.59*  --      Assessment: 68 year old male on warfarin for mechanical AVR who received INR reversal (Vitamin K 5mg  IV) on 9/9 for an AKA. Patient was on heparin as a bridge, warfarin was resumed per pharmacy dosing on 9/12. PTA warfarin dose: 10 mg daily except 12.5 mg on Tues/Thurs/Sat.  INR remains therapeutic at 2.7. No bleeding noted.   Goal of Therapy:  INR 2.5-3.5 Monitor platelets by anticoagulation protocol: Yes   Plan:  Warfarin 12.5mg  PO x 1 tonight Daily INR  Salome Arnt, PharmD, BCPS Please see AMION for all pharmacy numbers 09/01/2019 10:16 AM

## 2019-09-02 ENCOUNTER — Inpatient Hospital Stay (HOSPITAL_COMMUNITY): Payer: Medicare Other | Admitting: *Deleted

## 2019-09-02 ENCOUNTER — Inpatient Hospital Stay (HOSPITAL_COMMUNITY): Payer: Medicare Other

## 2019-09-02 LAB — GLUCOSE, CAPILLARY
Glucose-Capillary: 173 mg/dL — ABNORMAL HIGH (ref 70–99)
Glucose-Capillary: 219 mg/dL — ABNORMAL HIGH (ref 70–99)
Glucose-Capillary: 76 mg/dL (ref 70–99)
Glucose-Capillary: 218 mg/dL — ABNORMAL HIGH (ref 70–99)

## 2019-09-02 LAB — PROTIME-INR
INR: 2.6 — ABNORMAL HIGH (ref 0.8–1.2)
Prothrombin Time: 27.2 seconds — ABNORMAL HIGH (ref 11.4–15.2)

## 2019-09-02 MED ORDER — WARFARIN SODIUM 5 MG PO TABS
10.0000 mg | ORAL_TABLET | Freq: Once | ORAL | Status: AC
  Administered 2019-09-02: 18:00:00 10 mg via ORAL
  Filled 2019-09-02: qty 2

## 2019-09-02 NOTE — Progress Notes (Signed)
Occupational Therapy Weekly Progress Note  Patient Details  Name: Joseph Hoffman MRN: [id number] Date of Birth: 01-25-1951  Beginning of progress report period: August 26, 2019 End of progress report period: September 02, 2019  Patient has met 3 of 4 short term goals. Pt is making steady progress with BADLs and functional transfers.  LTG will be downgraded from mod I to supervision/CGA overall. Pt continues to fatigue easily and requires frequent rest breaks during activities. Pt performs slide board transfers with CGA/S after placement of board.  Pt requries min A for sit<>stand and standing balance. Pt currenlty requires mod A for toileting tasks.  Toilet transfers with min A using RW for stand pivot.   Patient continues to demonstrate the following deficits: muscle weakness, decreased cardiorespiratoy endurance, decreased memory and delayed processing and decreased sitting balance and decreased standing balance and therefore will continue to benefit from skilled OT intervention to enhance overall performance with BADL and Reduce care partner burden.  Patient not progressing toward long term goals.  See goal revision..  Plan of care revisions: Supervision overall, Min assist toileting and LB dressing.  OT Short Term Goals Week 2:  OT Short Term Goal 1 (Week 2): Pt will be able to transfer to [facility] and or toilet with min A using LRAD. OT Short Term Goal 1 - Progress (Week 2): Met OT Short Term Goal 2 (Week 2): Pt will be able to bathe LB with S OT Short Term Goal 2 - Progress (Week 2): Progressing toward goal OT Short Term Goal 3 (Week 2): Pt will be able to dress LB with min A at sit > stand level OT Short Term Goal 3 - Progress (Week 2): Met OT Short Term Goal 4 (Week 2): Pt will be able to maintain static stand for 1 minute with min A to pull clothing over hips. OT Short Term Goal 4 - Progress (Week 2): Met Week 3:  OT Short Term Goal 1 (Week 3): Pt will be able to bathe LB with S OT  Short Term Goal 2 (Week 3): Pt will complete toileting tasks with CGA standing for clothing management OT Short Term Goal 3 (Week 3): Pt will complete toilet transfers with CGA   [name] 09/02/2019, 6:50 AM

## 2019-09-02 NOTE — Progress Notes (Signed)
Physical Therapy Session Note  Patient Details  Name: Joseph Hoffman MRN: [id number] Date of Birth: Apr 28, 1951  Today's Date: 09/02/2019 PT Individual Time: 1140-1210 PT Individual Time Calculation (min): 30 min   Short Term Goals: Week 3:  PT Short Term Goal 1 (Week 3): Patient will perform bed mobility with supervision. PT Short Term Goal 2 (Week 3): Patient will be independent with HEP. PT Short Term Goal 3 (Week 3): Patient will perform level transfers with CGA. PT Short Term Goal 4 (Week 3): Patient will perform unlevel transfers with min A. PT Short Term Goal 5 (Week 3): Patient will ambulate 15 feet with CGA using LRAD.  Skilled Therapeutic Interventions/Progress Updates:     Patient in bed upon PT arrival. Patient alert and agreeable to PT session. Patient reported mild L shoulder soreness today, refused pain medication. PT provided repositioning, rest breaks, and distraction as pain interventions throughout session.   Therapeutic Activity: Bed Mobility: Patient performed supine to sit with min A in a flat bed without use of bed rails. Provided verbal cues for rolling to R side and setting R elbow to push up to elbow then hand to come to sitting. Transfers: Patient performed a slide board transfer bed>w/c and lateral scoot transfers w/c<>BSC with CGA and total A for board set up. Provided cues for board placement, hand placement, and patient able to teach back head-hips relationship. He performed sit to/from stand x2 during toileting to doff/don shorts with min A for balance and supervision for doffing shorts and min A to pull shorts up. He also performed a simulated car transfer with seat height set to 19" to simulate personal vehicle with CGA to get in and min A to get out of the car using the RW performing a stand pivot transfer. Provided verbal cues for set up, hand placement, and pivoting technique.  Wheelchair Mobility:  Patient propelled wheelchair >200 feet x2 to simulate  community mobility and for UE strengthening/endurance with supervision-mod I using B UEs and R LE for propulsion. Reported fatigue and SOB after second trial, educated on resting with SOB to avoid over exersion.   Patient in w/c at end of session with breaks locked, seat belt alarm set, and all needs within reach.    Therapy Documentation Precautions:  Precautions Precautions: Fall Precaution Comments: monitor HR, sats and BP Restrictions Weight Bearing Restrictions: Yes LLE Weight Bearing: Non weight bearing Other Position/Activity Restrictions: L AKA - in ace wrap.  no drainage seen through wrap    Therapy/Group: Individual Therapy  Tiona Ruane L Jassmin Kemmerer PT, DPT  09/02/2019, 3:13 PM

## 2019-09-02 NOTE — Telephone Encounter (Signed)
Called wife- no additional issues

## 2019-09-02 NOTE — Progress Notes (Signed)
Occupational Therapy Session Note  Patient Details  Name: Joseph Hoffman MRN: [id number] Date of Birth: 10-03-1951  Today's Date: 09/02/2019 OT Individual Time: 1300-1338 OT Individual Time Calculation (min): 38 min    Short Term Goals: Week 3:  OT Short Term Goal 1 (Week 3): Pt will be able to bathe LB with S OT Short Term Goal 2 (Week 3): Pt will complete toileting tasks with CGA standing for clothing management OT Short Term Goal 3 (Week 3): Pt will complete toilet transfers with CGA  Skilled Therapeutic Interventions/Progress Updates:    OT intervention with focus on slide board transfers, w/c mobility, sit<>stand, standing balance, activity tolerance, and safety awareness to increase independence with BADLs. Pt performs sliding board transfers with supervision after board placement.  Sit<>stand X 5 with supervision for standing tasks reaching outside BOS and squatting to retrieve horseshoes.  Pt required min A for standing balance during tasks.  Pt propelled back to room and transferred to bed. Pt remained in bed with all needs within reach and bed alarm activated.   Therapy Documentation Precautions:  Precautions Precautions: Fall Precaution Comments: monitor HR, sats and BP Restrictions Weight Bearing Restrictions: Yes LLE Weight Bearing: Non weight bearing Other Position/Activity Restrictions: L AKA - in ace wrap.  no drainage seen through wrap  Pain: Pt states his pain is "ok"   Therapy/Group: Individual Therapy  [name] 09/02/2019, 2:34 PM

## 2019-09-02 NOTE — Progress Notes (Addendum)
Physical Therapy Session Note  Patient Details  Name: Joseph Hoffman MRN: [id number] Date of Birth: 1951-06-26  Today's Date: 09/02/2019 PT Individual Time: 1450-1540 PT Individual Time Calculation (min): 50 min   Short Term Goals: Week 3:  PT Short Term Goal 1 (Week 3): Patient will perform bed mobility with supervision. PT Short Term Goal 2 (Week 3): Patient will be independent with HEP. PT Short Term Goal 3 (Week 3): Patient will perform level transfers with CGA. PT Short Term Goal 4 (Week 3): Patient will perform unlevel transfers with min A. PT Short Term Goal 5 (Week 3): Patient will ambulate 15 feet with CGA using LRAD.  Skilled Therapeutic Interventions/Progress Updates:  Pt resting in bed.  He asked to use toilet.  Using bed features, rolling R and sitting up with supervision.  ACE wrap in place, safely snug.  Slide board transfer to L bed> w/c with CGA.  W/c into bathroom, to toilet.  Using RW and BSC over toilet, stand pivot transfer with CGA.  Pt continent of urine.  W/c propulsion using bil UEs over level tile with supervision, x 150'.  Training for R foot rest on/off of w/c, repeatedly removing and replacing, but still needing min cues after 8 trials during session.   Gait training with RW x 10' total, with , CGA> min assist.  Pt initiated resting in standing q 3-4 steps, as his HR increased.  Therapeutic exercise performed with LE to increase strength for functional mobility: seated- 15 x 2 heel raises, bil glut sets, 10 x 1 bil adductor squeezes.  Stand pivot transfer to return to bed, using RW, CGA.  At end of session, pt in bed with alarm set and needs at hand.     Therapy Documentation Precautions:  Precautions Precautions: Fall Precaution Comments: monitor HR, sats and BP Restrictions Weight Bearing Restrictions: Yes LLE Weight Bearing: Non weight bearing Other Position/Activity Restrictions: L AKA - in ace wrap.  no drainage seen through wrap Pain: pt  denied       Therapy/Group: Individual Therapy  Yarely Bebee 09/02/2019, 4:26 PM

## 2019-09-02 NOTE — Telephone Encounter (Signed)
Discussed CABG- and want to wait til has temp prosthesis for CABG due to sternal precautions- she says pt forgets to tell her about daily issues.  Also discussed that since changed to 15/7 and no more than 30-45 min therapy at a time, hasn't had more heart issues.  She felt/voiced more comfortable after discussion.

## 2019-09-02 NOTE — Progress Notes (Signed)
Recreational Therapy Session Note  Patient Details  Name: Joseph Hoffman MRN: [id number] Date of Birth: 24-Jun-1951 Today's Date: 09/02/2019 Time:  1110-1130 Pain: no c/o Skilled Therapeutic Interventions/Progress Updates: Pt seated EOB anxious to discuss his weekend and the beginning of the week.  Pt had his notebook out that he had written notes in, including relaxation strategies that he had been using and referenced the handouts provided last session.  Also stated appreciation for aromatherapy and that I had shared this with weekend staff as a helpful tool for the pt.  Offered aromatherapy again today, and pt agreeable.  Discussed the use of aromatherapy as a therapy intervention including type of oil used, how it would be delivered, desired effect and complications from use including headache.  2 drops of lavender essential oil was applied to a cotton ball and placed on bedside table for inhalation in the room.  Instructed pt to call nursing to remove lavender cotton ball from room and discard it in the trash.  Pt stated understanding and is agreement with the above.     Therapy/Group: Individual Therapy  Tadhg Eskew 09/02/2019, 2:16 PM

## 2019-09-02 NOTE — Progress Notes (Signed)
[facility] PHYSICAL MEDICINE & REHABILITATION PROGRESS NOTE   Subjective/Complaints:   Pt reports no CP, no SOB, no cardiac or other issues- doing great with therapy.  Pain really well controlled.  Spoke also with wife about sternal precautions and need for prosthesis before he does CABG- after a 15 minutes conversation, was able to answer all her questions to her satisfaction  ROS: Patient denies fever, rash, sore throat, blurred vision, nausea, vomiting, diarrhea, cough, shortness of breath or chest pain,  headache, or mood change.    Objective:   No results found. Recent Labs    08/31/19 0609  WBC 9.3  HGB 9.1*  HCT 28.8*  PLT 350   Recent Labs    08/31/19 0609  NA 136  K 4.0  CL 98  CO2 26  GLUCOSE 101*  BUN 48*  CREATININE 1.59*  CALCIUM 9.6    Intake/Output Summary (Last 24 hours) at 09/02/2019 1500 Last data filed at 09/02/2019 1030 Gross per 24 hour  Intake 1120 ml  Output 1800 ml  Net -680 ml     Physical Exam: Vital Signs Blood pressure (!) 115/48, pulse 84, temperature 98.3 F (36.8 C), resp. rate 18, height 5\' 11"  (1.803 m), weight 100.4 kg, SpO2 99 %. Constitutional: No distress . Vital signs and labs and pharmacy notes reviewed. Sitting up in bed; alone for first day in awhile; bright affect; Ox3, NAD HEENT: EOMI, oral membranes moist Neck: supple Cardiovascular: RRR    (+click)  Respiratory: CTA Bilaterally without wheezes or rales. Normal effort    GI: BS +, non-tender, non-distended    Psych: alert and pleasant Musc: Left AKA with edema and tenderness- wrapped in ACE wrap Skin: incision/leg dressed Neurological: Alert Motor: Bilateral upper extremities: 5/5 proximal distal Right lower extremity: 4/5 proximal distal  Left lower extremity: 4+/5 (pain inhibition)    Assessment/Plan: 1. Functional deficits secondary to new L AKA due to ischemia/gangrene which require 3+ hours per day of interdisciplinary therapy in a comprehensive  inpatient rehab setting.  Physiatrist is providing close team supervision and 24 hour management of active medical problems listed below.  Physiatrist and rehab team continue to assess barriers to discharge/monitor patient progress toward functional and medical goals  Care Tool:  Bathing    Body parts bathed by patient: Right arm, Left arm, Chest, Abdomen, Front perineal area, Right upper leg, Right lower leg, Face   Body parts bathed by helper: Buttocks Body parts n/a: Left lower leg, Left upper leg   Bathing assist Assist Level: Minimal Assistance - Patient > 75%     Upper Body Dressing/Undressing Upper body dressing   What is the patient wearing?: Pull over shirt    Upper body assist Assist Level: Independent    Lower Body Dressing/Undressing Lower body dressing      What is the patient wearing?: Underwear/pull up, Pants     Lower body assist Assist for lower body dressing: Minimal Assistance - Patient > 75%     Toileting Toileting    Toileting assist Assist for toileting: Moderate Assistance - Patient 50 - 74% Assistive Device Comment: bedpan   Transfers Chair/bed transfer  Transfers assist  Chair/bed transfer activity did not occur: Safety/medical concerns  Chair/bed transfer assist level: Minimal Assistance - Patient > 75% Chair/bed transfer assistive device: Armrests   Locomotion Ambulation   Ambulation assist   Ambulation activity did not occur: Safety/medical concerns(labial BP and HR sitting EOB.)  Assist level: Contact Guard/Touching assist Assistive device: Walker-rolling Max distance:  12'   Walk 10 feet activity   Assist  Walk 10 feet activity did not occur: Safety/medical concerns(labial BP and HR sitting EOB.)  Assist level: Contact Guard/Touching assist Assistive device: Walker-rolling   Walk 50 feet activity   Assist Walk 50 feet with 2 turns activity did not occur: Safety/medical concerns(labial BP and HR sitting EOB.)          Walk 150 feet activity   Assist Walk 150 feet activity did not occur: Safety/medical concerns(labial BP and HR sitting EOB.)         Walk 10 feet on uneven surface  activity   Assist Walk 10 feet on uneven surfaces activity did not occur: Safety/medical concerns(labial BP and HR sitting EOB.)         Wheelchair     Assist Will patient use wheelchair at discharge?: Yes Type of Wheelchair: Manual Wheelchair activity did not occur: Safety/medical concerns(labial BP and HR sitting EOB.)  Wheelchair assist level: Independent Max wheelchair distance: 150'    Wheelchair 50 feet with 2 turns activity    Assist    Wheelchair 50 feet with 2 turns activity did not occur: Safety/medical concerns(labial BP and HR sitting EOB.)   Assist Level: Independent   Wheelchair 150 feet activity     Assist  Wheelchair 150 feet activity did not occur: Safety/medical concerns(labial BP and HR sitting EOB.)   Assist Level: Independent   Blood pressure (!) 115/48, pulse 84, temperature 98.3 F (36.8 C), resp. rate 18, height 5\' 11"  (1.803 m), weight 100.4 kg, SpO2 99 %.  Medical Problem List and Plan: 1.Fxnl and mobility deficitssecondary to PAD causing left AKA  Cont CIR 2.Mechanical AVR/Antithrombotics: -DVT/anticoagulation:Pharmaceutical:Coumadin and heparin GGT INR> 2.5.  Unfractionated heparin within normal range on 9/20   INR 2.4 on 9/20   9/21- INR 2.6- pharmacy stopped Heparin gtt- will con't coumadin mgmt  9/22- INR 2.9  9/23- INR 3.1- per pharmacy  9/24- INR 3.3- per pharmacy  9/27- INR 3.6- per pharmacy  9/28- INR 3.2  9/29- INR 2.7  9/30- INR 2.6  -antiplatelet therapy: N/A 3. Pain Management:  Scheduled low dose flexeril tid to help with spasms.  May need neurontin titrate upwarrds. Scheduled tramadol 50 mg TID and will use oxy only when needs it.  Reasonable control 9/27 4. Mood:LCSW to follow for evaluation  and support. -antipsychotic agents: N/A 5. Neuropsych: This patientiscapable of making decisions on hisown behalf. 6. Skin/Wound Care:Monitor wound daily. Monitor stump for signs and symptoms of infection  Daily dressings, drainage decreased  9/28- changed ACE wrap to TID per surgeon; ordered retention sock and shrinker for after d/c. 7. Fluids/Electrolytes/Nutrition:Monitor I/Os 8. T2DM: Monitor BS ac/hs. Continue Lantus 40 mg bid with Victoza  Resumed amaryl 2mg    9/15- increase Lantus to 44 units BID CBG (last 3)  Recent Labs    09/01/19 2020 09/02/19 0624 09/02/19 1232  GLUCAP 219* 76 218*   Labile on 9/20, monitor for trend  9/21- BGs much better controlled  9/24- running low 200s- will try to increase lantus slightly  9/26- lantus increased to 47u bid   -changed dto CM diet  9/27--add HS snack for am hypoglycemia  9/29- bgS 75-95- actually great control- will monitor  9/30- Bouncing somewhat- will monitor 9. CAD/CAF: Monitor for presyncope/syncope as back on Entresto.   Appreciate cards recs, metoprolol decreased  Dig level low on 9/18  Pulse pressure remains wide on 9/20  Heart rate improving on 9/20  9/21- Cards- decr  Metoprolol and Digoxin- pt feeling much better   9/25- Digoxin stopped per Cardiology 10 Chronic systolic CHF: Heart healthy diet and monitor for signs of overload. Check weight daily. On Entresto, Demadex, metoprolol and fenofibrate, Vascepa.    Filed Weights   08/30/19 0500 08/31/19 0417 09/01/19 0543  Weight: 98.2 kg 100 kg 100.4 kg   Weights stable 9/27 11. Urinary retention:   PVRs elevated  Continue Flomax  9/23- voiding "all night" per pt- which was new- didn't require cath o/n  9/26 voiding continently 12. Constipation:Start bowel program as this has been an issues since first surgery.   9/26--bm this morning 13.  Acute blood loss anemia  Transfused 1 unit PRBC on 9/16  Hemoglobin 8.6 on 9/20  Continue to  monitor 13. Hyponatremia:   Sodium 135 on 9/17  9/23- Na 133  Continue to monitor 14. OSA: CPAP when sleeping with 2 L bleed in.  15. Acute on chronic renal failure:   Creatinine 1.66 on 9/17   Encourage fluids  9/21- Cr down to 1.4  9/23- Cr up to 1.69 and BUN up to 68- is dry, Cards held QHS torsemide  9/24 Cr 1.62 and BUN down to 57 after Torsemide held  9/29- Cr 1.59 and BUN 48 -holding steady.  9/30- recheck Friday  Continue to monitor 16.  Transaminitis  LFTs elevated on 9/17, labs ordered for tomorrow  9/21- BMP was ordered- will recheck [facility] Thursday  9/24- ALT/AST slightly elevated in 40s  9/30- recheck Friday  Continue to monitor 17. Lethargy/malaise  9/23- U/A (-) for UTI- CBC shows mild leukocytosis; of 11.4k can't just start ABX, unsure as to why- AKA looks OK, per nursing; might be cold/URI (is more nasal); could be dehydration since Cr up and BUN significantly up. Will check labs in AM and monitor more closely.  9/24- resolved   18. Dispo  9/25- will order 15/7 for therapy as of Monday- per OT request.- so pt doesn't do more than 30-45 minutes of therapy max at a time.  9/28- changed to 15/7; Cards agreed shouldn't do CABG, if possible, until gets prosthesis. Will need more assistance- pt aware.  9/29- discussed prosthesis issue with pt- happy to speak with wife if needed- left my office number.  9/30- discussed with wife today- 15 minutes in addition to rounds.  LOS: 16 days A FACE TO FACE EVALUATION WAS PERFORMED  Jalie Eiland 09/02/2019, 3:00 PM

## 2019-09-02 NOTE — Plan of Care (Signed)
  Problem: RH Balance Goal: LTG: Patient will maintain dynamic sitting balance (OT) Description: LTG:  Patient will maintain dynamic sitting balance with assistance during activities of daily living (OT) Flowsheets (Taken 09/02/2019 1434) LTG: Pt will maintain dynamic sitting balance during ADLs with: (downgraded) Supervision/Verbal cueing Note: Downgraded due to change in discharge plan   Problem: RH Balance Goal: LTG Patient will maintain dynamic standing with ADLs (OT) Description: LTG:  Patient will maintain dynamic standing balance with assist during activities of daily living (OT)  Flowsheets (Taken 09/02/2019 1434) LTG: Pt will maintain dynamic standing balance during ADLs with: (Downgraded due to change in discharge plan) Contact Guard/Touching assist Note: Downgraded due to change in discharge plan   Problem: Sit to Stand Goal: LTG:  Patient will perform sit to stand in prep for activites of daily living with assistance level (OT) Description: LTG:  Patient will perform sit to stand in prep for activites of daily living with assistance level (OT) Flowsheets (Taken 09/02/2019 1434) LTG: PT will perform sit to stand in prep for activites of daily living with assistance level: (Downgraded due to change in discharge plan) Supervision/Verbal cueing Note: Downgraded due to change in discharge plan   Problem: RH Bathing Goal: LTG Patient will bathe all body parts with assist levels (OT) Description: LTG: Patient will bathe all body parts with assist levels (OT) Flowsheets (Taken 09/02/2019 1434) LTG: Pt will perform bathing with assistance level/cueing: (Downgraded due to change in discharge plan) Contact Guard/Touching assist Note: Downgraded due to change in discharge plan   Problem: RH Dressing Goal: LTG Patient will perform lower body dressing w/assist (OT) Description: LTG: Patient will perform lower body dressing with assist, with/without cues in positioning using equipment  (OT) Flowsheets (Taken 09/02/2019 1434) LTG: Pt will perform lower body dressing with assistance level of: (Downgraded due to change in discharge plan) Contact Guard/Touching assist Note: Downgraded due to change in discharge plan   Problem: RH Toileting Goal: LTG Patient will perform toileting task (3/3 steps) with assistance level (OT) Description: LTG: Patient will perform toileting task (3/3 steps) with assistance level (OT)  Flowsheets (Taken 09/02/2019 1434) LTG: Pt will perform toileting task (3/3 steps) with assistance level: (Downgraded due to change in discharge plan) Minimal Assistance - Patient > 75% Note: Downgraded due to change in discharge plan   Problem: RH Toilet Transfers Goal: LTG Patient will perform toilet transfers w/assist (OT) Description: LTG: Patient will perform toilet transfers with assist, with/without cues using equipment (OT) Flowsheets (Taken 09/02/2019 1434) LTG: Pt will perform toilet transfers with assistance level of: Supervision/Verbal cueing   Problem: RH Tub/Shower Transfers Goal: LTG Patient will perform tub/shower transfers w/assist (OT) Description: LTG: Patient will perform tub/shower transfers with assist, with/without cues using equipment (OT) Flowsheets (Taken 09/02/2019 1434) LTG: Pt will perform tub/shower stall transfers with assistance level of: (D/C due to not a focus at this time) -- Note: D/C due to not a focus at this time

## 2019-09-02 NOTE — Progress Notes (Signed)
ANTICOAGULATION CONSULT NOTE - Follow Up Consult  Pharmacy Consult for warfarin Indication: Mechanical valve  Patient Measurements: Height: 5\' 11"  (180.3 cm) Weight: 221 lb 5.5 oz (100.4 kg) IBW/kg (Calculated) : 75.3  Vital Signs: Temp: 98.3 F (36.8 C) (09/30 0511) BP: 115/48 (09/30 0511) Pulse Rate: 84 (09/30 0744)  Labs: Recent Labs    08/31/19 0609 09/01/19 0826 09/02/19 0519  HGB 9.1*  --   --   HCT 28.8*  --   --   PLT 350  --   --   LABPROT 31.9* 28.5* 27.2*  INR 3.2* 2.7* 2.6*  CREATININE 1.59*  --   --      Assessment: 68 year old male on warfarin for mechanical AVR who received INR reversal (Vitamin K 5mg  IV) on 9/9 for an AKA. Patient was on heparin as a bridge, warfarin was resumed per pharmacy dosing on 9/12. PTA warfarin dose: 10 mg daily except 12.5 mg on Tues/Thurs/Sat.  INR remains therapeutic at 2.6. No bleeding noted.   Goal of Therapy:  INR 2.5-3.5 Monitor platelets by anticoagulation protocol: Yes   Plan:  Warfarin 10mg  PO x 1 tonight Daily INR  Emeline General, PharmD Candidate  Please see AMION for all pharmacy numbers 09/02/2019 9:33 AM

## 2019-09-02 NOTE — Progress Notes (Signed)
Occupational Therapy Session Note  Patient Details  Name: Joseph Hoffman MRN: [id number] Date of Birth: [birth year]  Today's Date: 09/02/2019 OT Individual Time: 0815-0900 OT Individual Time Calculation (min): 45 min    Short Term Goals: Week 3:  OT Short Term Goal 1 (Week 3): Pt will be able to bathe LB with S OT Short Term Goal 2 (Week 3): Pt will complete toileting tasks with CGA standing for clothing management OT Short Term Goal 3 (Week 3): Pt will complete toilet transfers with CGA  Skilled Therapeutic Interventions/Progress Updates:    OT intervention with focus on bed mobility, functional transfers, sit<>stand, standing balance, BADL training, and safety awareness to increase independence with BADLs.    Supine<>EOB-supervision with bed rails Sit<>stand-CGA/min A Standing balance-min A LB dressing-min A LB bathing-min A Scoot transfer level surface-Supervision Slide board transfer-Supervision after board placement  Pt remained seated EOB with bed alarm activated and bedside table in front for pt to read/write. RN notified.  Therapy Documentation Precautions:  Precautions Precautions: Fall Precaution Comments: monitor HR, sats and BP Restrictions Weight Bearing Restrictions: Yes LLE Weight Bearing: Non weight bearing Other Position/Activity Restrictions: L AKA - in ace wrap.  no drainage seen through wrap Pain:  Pt c/o 3/10 LLE pain; repositioned; declined meds    Therapy/Group: Individual Therapy  [name] 09/02/2019, 9:33 AM

## 2019-09-03 ENCOUNTER — Inpatient Hospital Stay (HOSPITAL_COMMUNITY): Payer: Medicare Other

## 2019-09-03 LAB — GLUCOSE, CAPILLARY
Glucose-Capillary: 72 mg/dL (ref 70–99)
Glucose-Capillary: 130 mg/dL — ABNORMAL HIGH (ref 70–99)
Glucose-Capillary: 69 mg/dL — ABNORMAL LOW (ref 70–99)
Glucose-Capillary: 95 mg/dL (ref 70–99)
Glucose-Capillary: 163 mg/dL — ABNORMAL HIGH (ref 70–99)
Glucose-Capillary: 91 mg/dL (ref 70–99)

## 2019-09-03 LAB — PROTIME-INR
INR: 2.2 — ABNORMAL HIGH (ref 0.8–1.2)
Prothrombin Time: 23.7 seconds — ABNORMAL HIGH (ref 11.4–15.2)

## 2019-09-03 MED ORDER — WARFARIN SODIUM 7.5 MG PO TABS
12.5000 mg | ORAL_TABLET | Freq: Once | ORAL | Status: AC
  Administered 2019-09-03: 12.5 mg via ORAL
  Filled 2019-09-03: qty 1

## 2019-09-03 MED ORDER — ENOXAPARIN SODIUM 100 MG/ML ~~LOC~~ SOLN
1.0000 mg/kg | Freq: Two times a day (BID) | SUBCUTANEOUS | Status: DC
  Administered 2019-09-03 (×2): 100 mg via SUBCUTANEOUS
  Filled 2019-09-03 (×3): qty 1

## 2019-09-03 NOTE — Progress Notes (Signed)
Recreational Therapy Session Note  Patient Details  Name: KALETH JACKS MRN: [id number] Date of Birth: 1951-10-31 Today's Date: 09/03/2019 Time:  1130-12 Pain: no c/o Skilled Therapeutic Interventions/Progress Updates: Session focused on relaxation training during co-treat with OT.  Pt performing sit-stands with RW working on safety, standing tolerance and standing balance incorporating diaphragamitic breathing.  Also provided aromatherapy in the room for use per pt request.  2 drops of lavender essential oil provided on a cotton ball for inhalation on bedside table.  Pt appreciative.    Therapy/Group: Co-Treatment  Maynard David 09/03/2019, 2:51 PM

## 2019-09-03 NOTE — Progress Notes (Signed)
Occupational Therapy Session Note  Patient Details  Name: Joseph Hoffman MRN: [id number] Date of Birth: [birth year]  Today's Date: 09/03/2019 OT Individual Time: 1115-1200 OT Individual Time Calculation (min): 45 min    Short Term Goals: Week 3:  OT Short Term Goal 1 (Week 3): Pt will be able to bathe LB with S OT Short Term Goal 2 (Week 3): Pt will complete toileting tasks with CGA standing for clothing management OT Short Term Goal 3 (Week 3): Pt will complete toilet transfers with CGA  Skilled Therapeutic Interventions/Progress Updates:    OT intervention with focus on bed mobility, sit<>stand, and standing balance/endurance. Pt supine>sit EOB supervision with bed rails.  Sit<>stand X 2 to don LLE retention garment and doff/don pants.  Sit<>stand X 2 with 2 periods of standing (5 mins and 2.5 mins) with CGA. Pt stated he was fatiguing during second stand. Recreational Therapist joined session and engaged in conversation relating to relaxation techniques. Discussed discharge plans and equipment needs.  Pt would like wide drop arm BSC to facilitate hygiene. Pt remained seated EOB with bed alarm activated, awaiting lunch.   Therapy Documentation Precautions:  Precautions Precautions: Fall Precaution Comments: monitor HR, sats and BP Restrictions Weight Bearing Restrictions: Yes LLE Weight Bearing: Non weight bearing Other Position/Activity Restrictions: L AKA - in ace wrap.  no drainage seen through wrap  Pain:  Pt denies pain but c/o of chronic dizziness (5/10)   Therapy/Group: Individual Therapy  [name] 09/03/2019, 12:19 PM

## 2019-09-03 NOTE — Progress Notes (Signed)
Physical Therapy Session Note  Patient Details  Name: Joseph Hoffman MRN: [id number] Date of Birth: 13-Oct-1951  Today's Date: 09/03/2019 PT Individual Time: 0800-0845 PT Individual Time Calculation (min): 45 min   Short Term Goals:   Week 3:  PT Short Term Goal 1 (Week 3): Patient will perform bed mobility with supervision. PT Short Term Goal 2 (Week 3): Patient will be independent with HEP. PT Short Term Goal 3 (Week 3): Patient will perform level transfers with CGA. PT Short Term Goal 4 (Week 3): Patient will perform unlevel transfers with min A. PT Short Term Goal 5 (Week 3): Patient will ambulate 15 feet with CGA using LRAD.  Skilled Therapeutic Interventions/Progress Updates:   Pt sitting in w/c.  He denied pain.  W/c propulsion on level tile, distant supervision on unit.  Pt removed R footrest with supervision, no cues; he needed hand over hand assist to manage R foot rest back on, x 3 during session.  Pt prepared w/c for transfer with mod cues for sequencing.  Stand pivot to mat with RW, min assist.  Mat mobility with supervision.  Lateral/up/down scooting on mat with extra time.  Therapeutic exercises performed with LEs to increase strength for functional mobility.  In supine- 5 x 1 each: bil bridging iwht towel roll under L residual limb, L hip extension over towel roll; in R/L side lying 10 x 1 hip abduction ; seated 20 x 1 R heel raises.  Attempted prone, but pt unable to tolerate even with pillow under pelvis.  Stand pivot with RW back to bed, min assist.  Sit> supine iwht supervision.  Alarm set and needs left at hand.     Therapy Documentation Precautions:  Precautions Precautions: Fall Precaution Comments: monitor HR, sats and BP Restrictions Weight Bearing Restrictions: Yes LLE Weight Bearing: Non weight bearing Other Position/Activity Restrictions: L AKA - in ace wrap.  no drainage seen through wrap        Therapy/Group: Individual  Therapy  Timmi Devora 09/03/2019, 12:26 PM

## 2019-09-03 NOTE — Progress Notes (Signed)
ANTICOAGULATION CONSULT NOTE - Follow Up Consult  Pharmacy Consult for warfarin Indication: Mechanical valve  Patient Measurements: Height: 5\' 11"  (180.3 cm) Weight: 220 lb 7.4 oz (100 kg) IBW/kg (Calculated) : 75.3  Vital Signs: Temp: 97.6 F (36.4 C) (10/01 0442) Temp Source: Oral (10/01 0442) BP: 112/53 (10/01 0442) Pulse Rate: 73 (10/01 0442)  Labs: Recent Labs    09/01/19 0826 09/02/19 0519 09/03/19 0701  LABPROT 28.5* 27.2* 23.7*  INR 2.7* 2.6* 2.2*     Assessment: 68 year old male on warfarin for mechanical AVR who received INR reversal (Vitamin K 5mg  IV) on 9/9 for an AKA. Patient was on heparin as a bridge, warfarin was resumed per pharmacy dosing on 9/12. PTA warfarin dose: 10 mg daily except 12.5 mg on Tues/Thurs/Sat.  INR now dropped slightly low to 2.2. Unclear cause for drop in INR.   Goal of Therapy:  INR 2.5-3.5 Monitor platelets by anticoagulation protocol: Yes   Plan:  Warfarin 12.5mg  PO x 1 - will give dose now to attempt to keep INR from dropping further Daily INR Consider therapeutic lovenox x 24 hours in hopes INR will be at goal again tomorrow morning  Salome Arnt, PharmD, BCPS Please see AMION for all pharmacy numbers 09/03/2019 7:54 AM

## 2019-09-03 NOTE — Progress Notes (Signed)
Occupational Therapy Session Note  Patient Details  Name: Joseph Hoffman MRN: [id number] Date of Birth: 29-Jun-1951  Today's Date: 09/03/2019 OT Individual Time: 1345-1430 OT Individual Time Calculation (min): 45 min    Short Term Goals: Week 3:  OT Short Term Goal 1 (Week 3): Pt will be able to bathe LB with S OT Short Term Goal 2 (Week 3): Pt will complete toileting tasks with CGA standing for clothing management OT Short Term Goal 3 (Week 3): Pt will complete toilet transfers with CGA  Skilled Therapeutic Interventions/Progress Updates:    Pt sitting on BSC over toilet upon arrival.  Mod A for clothing management and CGA for stand pivot transfer to w/c.  Pt propelled w/c to gym.  OT intervention with focus on w/c setup, placing slide board for transfers, slide board transfers, sit<>stand, standing balance, and safety awareness to increase independence with BADLs.  Pt performed slide board transfer X 4, including board placement/removal, at supervision level to level surface. Pt able to place R leg rest without assistance. Pt performed sit<>stand X 3 from lower surface at supervision level.  Pt required CGA for standing balance tasks toss horseshoes at target. Pt propelled w/c back to room and transferred to bed.  Pt remained in bed with all needs within reach and bed alarm activated.   Therapy Documentation Precautions:  Precautions Precautions: Fall Precaution Comments: monitor HR, sats and BP Restrictions Weight Bearing Restrictions: Yes LLE Weight Bearing: Non weight bearing Other Position/Activity Restrictions: L AKA - in ace wrap.  no drainage seen through wrap  Pain:  Pt denies pain this afternoon   Therapy/Group: Individual Therapy  [name] 09/03/2019, 2:32 PM

## 2019-09-03 NOTE — Progress Notes (Signed)
[facility] PHYSICAL MEDICINE & REHABILITATION PROGRESS NOTE   Subjective/Complaints:   Pt reports scared about getting staples out- today is 3 weeks from surgery date for L AKA.  Per pharmacy, suggested Lovenox- treatment dose x 24 hours, in spite of renal mild impairment, due to INR of 2.2- placed order- will monitor closely. It's probably better to do Tx dose Lovenox x24 hours than Heparin gtt.  ROS: Patient denies fever, rash, sore throat, blurred vision, nausea, vomiting, diarrhea, cough, shortness of breath or chest pain,  headache, or mood change.    Objective:   No results found. No results for input(s): WBC, HGB, HCT, PLT in the last 72 hours. No results for input(s): NA, K, CL, CO2, GLUCOSE, BUN, CREATININE, CALCIUM in the last 72 hours.  Intake/Output Summary (Last 24 hours) at 09/03/2019 0953 Last data filed at 09/03/2019 0753 Gross per 24 hour  Intake 1120 ml  Output 425 ml  Net 695 ml     Physical Exam: Vital Signs Blood pressure (!) 112/53, pulse 73, temperature 97.6 F (36.4 C), temperature source Oral, resp. rate 18, height 5\' 11"  (1.803 m), weight 100 kg, SpO2 98 %. Constitutional: No distress . Vital signs and labs and pharmacy notes reviewed. Sitting up on mat in gym with PT, appropriate, was able to remember conversation with me and wife, NAD HEENT: EOMI, oral membranes moist Neck: supple Cardiovascular: RRR    (+click)  Respiratory: CTA Bilaterally without wheezes or rales. Normal effort    GI: BS +, non-tender, non-distended    Psych: alert and pleasant Musc: Left AKA with edema and tenderness- wrapped in ACE wrap- slightly loose today compared to prior. Skin: incision/leg dressed Neurological: Alert Motor: Bilateral upper extremities: 5/5 proximal distal Right lower extremity: 4/5 proximal distal  Left lower extremity: 4+/5 (pain inhibition)    Assessment/Plan: 1. Functional deficits secondary to new L AKA due to ischemia/gangrene which require 3+  hours per day of interdisciplinary therapy in a comprehensive inpatient rehab setting.  Physiatrist is providing close team supervision and 24 hour management of active medical problems listed below.  Physiatrist and rehab team continue to assess barriers to discharge/monitor patient progress toward functional and medical goals  Care Tool:  Bathing    Body parts bathed by patient: Right arm, Left arm, Chest, Abdomen, Front perineal area, Right upper leg, Right lower leg, Face   Body parts bathed by helper: Buttocks Body parts n/a: Left lower leg, Left upper leg   Bathing assist Assist Level: Minimal Assistance - Patient > 75%     Upper Body Dressing/Undressing Upper body dressing   What is the patient wearing?: Pull over shirt    Upper body assist Assist Level: Independent    Lower Body Dressing/Undressing Lower body dressing      What is the patient wearing?: Underwear/pull up, Pants     Lower body assist Assist for lower body dressing: Minimal Assistance - Patient > 75%     Toileting Toileting    Toileting assist Assist for toileting: Moderate Assistance - Patient 50 - 74% Assistive Device Comment: bedpan   Transfers Chair/bed transfer  Transfers assist  Chair/bed transfer activity did not occur: Safety/medical concerns  Chair/bed transfer assist level: Contact Guard/Touching assist Chair/bed transfer assistive device: Programmer, multimedia   Ambulation assist   Ambulation activity did not occur: Safety/medical concerns(labial BP and HR sitting EOB.)  Assist level: Minimal Assistance - Patient > 75% Assistive device: Walker-rolling Max distance: 3   Walk 10  feet activity   Assist  Walk 10 feet activity did not occur: Safety/medical concerns(labial BP and HR sitting EOB.)  Assist level: Contact Guard/Touching assist Assistive device: Walker-rolling   Walk 50 feet activity   Assist Walk 50 feet with 2 turns activity did not occur:  Safety/medical concerns(labial BP and HR sitting EOB.)         Walk 150 feet activity   Assist Walk 150 feet activity did not occur: Safety/medical concerns(labial BP and HR sitting EOB.)         Walk 10 feet on uneven surface  activity   Assist Walk 10 feet on uneven surfaces activity did not occur: Safety/medical concerns(labial BP and HR sitting EOB.)         Wheelchair     Assist Will patient use wheelchair at discharge?: Yes Type of Wheelchair: Manual Wheelchair activity did not occur: Safety/medical concerns(labial BP and HR sitting EOB.)  Wheelchair assist level: Supervision/Verbal cueing Max wheelchair distance: 150    Wheelchair 50 feet with 2 turns activity    Assist    Wheelchair 50 feet with 2 turns activity did not occur: Safety/medical concerns(labial BP and HR sitting EOB.)   Assist Level: Supervision/Verbal cueing   Wheelchair 150 feet activity     Assist  Wheelchair 150 feet activity did not occur: Safety/medical concerns(labial BP and HR sitting EOB.)   Assist Level: Supervision/Verbal cueing   Blood pressure (!) 112/53, pulse 73, temperature 97.6 F (36.4 C), temperature source Oral, resp. rate 18, height 5\' 11"  (1.803 m), weight 100 kg, SpO2 98 %.  Medical Problem List and Plan: 1.Fxnl and mobility deficitssecondary to PAD causing left AKA  Cont CIR 2.Mechanical AVR/Antithrombotics: -DVT/anticoagulation:Pharmaceutical:Coumadin and heparin GGT INR> 2.5.  Unfractionated heparin within normal range on 9/20   INR 2.4 on 9/20   9/21- INR 2.6- pharmacy stopped Heparin gtt- will con't coumadin mgmt  9/22- INR 2.9  9/23- INR 3.1- per pharmacy  9/24- INR 3.3- per pharmacy  9/27- INR 3.6- per pharmacy  9/28- INR 3.2  9/29- INR 2.7  9/30- INR 2.6  10/1- INR 2.2- will try treatment dose lovenox x 24 hrs  Per pharmacy.   -antiplatelet therapy: N/A 3. Pain Management:  Scheduled low dose flexeril tid to help  with spasms.  May need neurontin titrate upwarrds. Scheduled tramadol 50 mg TID and will use oxy only when needs it.  Reasonable control 9/27 4. Mood:LCSW to follow for evaluation and support. -antipsychotic agents: N/A 5. Neuropsych: This patientiscapable of making decisions on hisown behalf. 6. Skin/Wound Care:Monitor wound daily. Monitor stump for signs and symptoms of infection  Daily dressings, drainage decreased  9/28- changed ACE wrap to TID per surgeon; ordered retention sock and shrinker for after d/c. 7. Fluids/Electrolytes/Nutrition:Monitor I/Os 8. T2DM: Monitor BS ac/hs. Continue Lantus 40 mg bid with Victoza  Resumed amaryl 2mg    9/15- increase Lantus to 44 units BID CBG (last 3)  Recent Labs    09/02/19 1232 09/03/19 0620 09/03/19 0721  GLUCAP 218* 69* 95   Labile on 9/20, monitor for trend  9/21- BGs much better controlled  9/24- running low 200s- will try to increase lantus slightly  9/26- lantus increased to 47u bid   -changed dto CM diet  9/27--add HS snack for am hypoglycemia  9/29- bgS 75-95- actually great control- will monitor  10/1- Bouncing somewhat- will monitor 9. CAD/CAF: Monitor for presyncope/syncope as back on Entresto.   Appreciate cards recs, metoprolol decreased  Dig level low on 9/18  Pulse pressure remains wide on 9/20  Heart rate improving on 9/20  9/21- Cards- decr Metoprolol and Digoxin- pt feeling much better   9/25- Digoxin stopped per Cardiology 10 Chronic systolic CHF: Heart healthy diet and monitor for signs of overload. Check weight daily. On Entresto, Demadex, metoprolol and fenofibrate, Vascepa.    Filed Weights   08/31/19 0417 09/01/19 0543 09/03/19 0500  Weight: 100 kg 100.4 kg 100 kg   Weights stable 10/1 11. Urinary retention:   PVRs elevated  Continue Flomax  9/23- voiding "all night" per pt- which was new- didn't require cath o/n  9/26 voiding continently 12.  Constipation:Start bowel program as this has been an issues since first surgery.   9/26--bm this morning 13.  Acute blood loss anemia  Transfused 1 unit PRBC on 9/16  Hemoglobin 8.6 on 9/20  Continue to monitor 13. Hyponatremia:   Sodium 135 on 9/17  9/23- Na 133  Continue to monitor 14. OSA: CPAP when sleeping with 2 L bleed in.  15. Acute on chronic renal failure:   Creatinine 1.66 on 9/17   Encourage fluids  9/21- Cr down to 1.4  9/23- Cr up to 1.69 and BUN up to 68- is dry, Cards held QHS torsemide  9/24 Cr 1.62 and BUN down to 57 after Torsemide held  9/29- Cr 1.59 and BUN 48 -holding steady.  9/30- recheck Friday  Continue to monitor 16.  Transaminitis  LFTs elevated on 9/17, labs ordered for tomorrow  9/21- BMP was ordered- will recheck [facility] Thursday  9/24- ALT/AST slightly elevated in 40s  9/30- recheck Friday  Continue to monitor 17. Lethargy/malaise  9/23- U/A (-) for UTI- CBC shows mild leukocytosis; of 11.4k can't just start ABX, unsure as to why- AKA looks OK, per nursing; might be cold/URI (is more nasal); could be dehydration since Cr up and BUN significantly up. Will check labs in AM and monitor more closely.  9/24- resolved   18. Dispo  9/25- will order 15/7 for therapy as of Monday- per OT request.- so pt doesn't do more than 30-45 minutes of therapy max at a time.  9/28- changed to 15/7; Cards agreed shouldn't do CABG, if possible, until gets prosthesis. Will need more assistance- pt aware.  9/29- discussed prosthesis issue with pt- happy to speak with wife if needed- left my office number.  9/30- discussed with wife today- 15 minutes in addition to rounds.  10/1- will call Dr Ainsley Spinner office, per pt request- Dr Carlis Abbott wants to remove staples/look at AKA himself.   LOS: 17 days A FACE TO FACE EVALUATION WAS PERFORMED  Mykayla Brinton 09/03/2019, 9:53 AM

## 2019-09-04 ENCOUNTER — Inpatient Hospital Stay (HOSPITAL_COMMUNITY): Payer: Medicare Other

## 2019-09-04 ENCOUNTER — Inpatient Hospital Stay (HOSPITAL_COMMUNITY): Payer: Medicare Other | Admitting: *Deleted

## 2019-09-04 LAB — COMPREHENSIVE METABOLIC PANEL
ALT: 26 U/L (ref 0–44)
AST: 34 U/L (ref 15–41)
Albumin: 3.2 g/dL — ABNORMAL LOW (ref 3.5–5.0)
Alkaline Phosphatase: 51 U/L (ref 38–126)
Anion gap: 11 (ref 5–15)
BUN: 43 mg/dL — ABNORMAL HIGH (ref 8–23)
CO2: 27 mmol/L (ref 22–32)
Calcium: 9.2 mg/dL (ref 8.9–10.3)
Chloride: 99 mmol/L (ref 98–111)
Creatinine, Ser: 1.57 mg/dL — ABNORMAL HIGH (ref 0.61–1.24)
GFR calc Af Amer: 51 mL/min — ABNORMAL LOW (ref >60)
GFR calc non Af Amer: 44 mL/min — ABNORMAL LOW (ref >60)
Glucose, Bld: 82 mg/dL (ref 70–99)
Potassium: 4.1 mmol/L (ref 3.5–5.1)
Sodium: 137 mmol/L (ref 135–145)
Total Bilirubin: 0.9 mg/dL (ref 0.3–1.2)
Total Protein: 6.5 g/dL (ref 6.5–8.1)

## 2019-09-04 LAB — GLUCOSE, CAPILLARY
Glucose-Capillary: 152 mg/dL — ABNORMAL HIGH (ref 70–99)
Glucose-Capillary: 86 mg/dL (ref 70–99)
Glucose-Capillary: 173 mg/dL — ABNORMAL HIGH (ref 70–99)

## 2019-09-04 LAB — CBC WITH DIFFERENTIAL/PLATELET
Abs Immature Granulocytes: 0.11 10*3/uL — ABNORMAL HIGH (ref 0.00–0.07)
Basophils Absolute: 0.1 10*3/uL (ref 0.0–0.1)
Basophils Relative: 1 %
Eosinophils Absolute: 0.8 10*3/uL — ABNORMAL HIGH (ref 0.0–0.5)
Eosinophils Relative: 8 %
HCT: 29.7 % — ABNORMAL LOW (ref 39.0–52.0)
Hemoglobin: 9.5 g/dL — ABNORMAL LOW (ref 13.0–17.0)
Immature Granulocytes: 1 %
Lymphocytes Relative: 10 %
Lymphs Abs: 0.9 10*3/uL (ref 0.7–4.0)
MCH: 26.6 pg (ref 26.0–34.0)
MCHC: 32 g/dL (ref 30.0–36.0)
MCV: 83.2 fL (ref 80.0–100.0)
Monocytes Absolute: 0.9 10*3/uL (ref 0.1–1.0)
Monocytes Relative: 10 %
Neutro Abs: 6.4 10*3/uL (ref 1.7–7.7)
Neutrophils Relative %: 70 %
Platelets: 302 10*3/uL (ref 150–400)
RBC: 3.57 MIL/uL — ABNORMAL LOW (ref 4.22–5.81)
RDW: 17 % — ABNORMAL HIGH (ref 11.5–15.5)
WBC: 9.2 10*3/uL (ref 4.0–10.5)
nRBC: 0 % (ref 0.0–0.2)

## 2019-09-04 LAB — PROTIME-INR
INR: 2.6 — ABNORMAL HIGH (ref 0.8–1.2)
Prothrombin Time: 27.2 seconds — ABNORMAL HIGH (ref 11.4–15.2)

## 2019-09-04 MED ORDER — WARFARIN SODIUM 5 MG PO TABS
10.0000 mg | ORAL_TABLET | Freq: Once | ORAL | Status: AC
  Administered 2019-09-04: 18:00:00 10 mg via ORAL
  Filled 2019-09-04: qty 2

## 2019-09-04 NOTE — Progress Notes (Signed)
Social Work Patient ID: Joseph Hoffman, male   DOB: 01/02/1951, 68 y.o.   MRN: [id number]   CSW met with pt and spoke with his wife via telephone to update them on team conference discussion and targeted d/c date of 09-15-19.  Pt/wife are pleased with this and they both feel good about his progress.  They have arranged for two different family members to come and help both of them, as pt's wife has knee replacement scheduled for 09-24-19.  Explained that Jacqlyn Larsen will arrange pt's f/u therapies and order DME closer to d/c.  Pt's wife was appreciative.  CSW will continue to follow and assist as needed.

## 2019-09-04 NOTE — Progress Notes (Signed)
[facility] PHYSICAL MEDICINE & REHABILITATION PROGRESS NOTE   Subjective/Complaints:   Pt reports having a great day- had a great day yesterday; did a sliding board transfer mod I this AM for first time.  Did own ACE wrapping of L AKA with min cues from PT- asking when Dr Carlis Abbott wants to remove staples- they said won't remove until 10/10- 1 month after surgery.  ROS: Patient denies fever, rash, sore throat, blurred vision, nausea, vomiting, diarrhea, cough, shortness of breath or chest pain,  headache, or mood change.    Objective:   No results found. Recent Labs    09/04/19 0641  WBC 9.2  HGB 9.5*  HCT 29.7*  PLT 302   Recent Labs    09/04/19 0641  NA 137  K 4.1  CL 99  CO2 27  GLUCOSE 82  BUN 43*  CREATININE 1.57*  CALCIUM 9.2    Intake/Output Summary (Last 24 hours) at 09/04/2019 0929 Last data filed at 09/04/2019 0548 Gross per 24 hour  Intake 1248 ml  Output 1200 ml  Net 48 ml     Physical Exam: Vital Signs Blood pressure (!) 125/43, pulse 78, temperature (!) 97.5 F (36.4 C), temperature source Oral, resp. rate 16, height 5\' 11"  (1.803 m), weight 100 kg, SpO2 97 %. Constitutional: No distress . Vital signs and labs and pharmacy notes reviewed. Sitting up in manual w/c, in room; just finished wrapping ACE wrap, so didn't unwrap, PT in room, NAD HEENT: EOMI, oral membranes moist Neck: supple Cardiovascular: RRR    (+click)  Respiratory: CTA Bilaterally without wheezes or rales. Normal effort    GI: BS +, non-tender, non-distended    Psych: alert and pleasant Musc: Left AKA with edema and tenderness- wrapped in ACE wrap- looks great wrapping Skin: incision/leg dressed Neurological: Alert Motor: Bilateral upper extremities: 5/5 proximal distal Right lower extremity: 4/5 proximal distal  Left lower extremity: 4+/5 (pain inhibition)    Assessment/Plan: 1. Functional deficits secondary to new L AKA due to ischemia/gangrene which require 3+ hours per day  of interdisciplinary therapy in a comprehensive inpatient rehab setting.  Physiatrist is providing close team supervision and 24 hour management of active medical problems listed below.  Physiatrist and rehab team continue to assess barriers to discharge/monitor patient progress toward functional and medical goals  Care Tool:  Bathing    Body parts bathed by patient: Right arm, Left arm, Chest, Abdomen, Front perineal area, Right upper leg, Right lower leg, Face   Body parts bathed by helper: Buttocks Body parts n/a: Left lower leg, Left upper leg   Bathing assist Assist Level: Minimal Assistance - Patient > 75%     Upper Body Dressing/Undressing Upper body dressing   What is the patient wearing?: Pull over shirt    Upper body assist Assist Level: Independent    Lower Body Dressing/Undressing Lower body dressing      What is the patient wearing?: Underwear/pull up, Pants     Lower body assist Assist for lower body dressing: Minimal Assistance - Patient > 75%     Toileting Toileting    Toileting assist Assist for toileting: Moderate Assistance - Patient 50 - 74% Assistive Device Comment: bedpan   Transfers Chair/bed transfer  Transfers assist  Chair/bed transfer activity did not occur: Safety/medical concerns  Chair/bed transfer assist level: Contact Guard/Touching assist Chair/bed transfer assistive device: Walker   Locomotion Ambulation   Ambulation assist   Ambulation activity did not occur: Safety/medical concerns(labial BP and HR sitting  EOB.)  Assist level: Minimal Assistance - Patient > 75% Assistive device: Walker-rolling Max distance: 3   Walk 10 feet activity   Assist  Walk 10 feet activity did not occur: Safety/medical concerns(labial BP and HR sitting EOB.)  Assist level: Contact Guard/Touching assist Assistive device: Walker-rolling   Walk 50 feet activity   Assist Walk 50 feet with 2 turns activity did not occur: Safety/medical  concerns(labial BP and HR sitting EOB.)         Walk 150 feet activity   Assist Walk 150 feet activity did not occur: Safety/medical concerns(labial BP and HR sitting EOB.)         Walk 10 feet on uneven surface  activity   Assist Walk 10 feet on uneven surfaces activity did not occur: Safety/medical concerns(labial BP and HR sitting EOB.)         Wheelchair     Assist Will patient use wheelchair at discharge?: Yes Type of Wheelchair: Manual Wheelchair activity did not occur: Safety/medical concerns(labial BP and HR sitting EOB.)  Wheelchair assist level: Supervision/Verbal cueing Max wheelchair distance: 150    Wheelchair 50 feet with 2 turns activity    Assist    Wheelchair 50 feet with 2 turns activity did not occur: Safety/medical concerns(labial BP and HR sitting EOB.)   Assist Level: Supervision/Verbal cueing   Wheelchair 150 feet activity     Assist  Wheelchair 150 feet activity did not occur: Safety/medical concerns(labial BP and HR sitting EOB.)   Assist Level: Supervision/Verbal cueing   Blood pressure (!) 125/43, pulse 78, temperature (!) 97.5 F (36.4 C), temperature source Oral, resp. rate 16, height 5\' 11"  (1.803 m), weight 100 kg, SpO2 97 %.  Medical Problem List and Plan: 1.Fxnl and mobility deficitssecondary to PAD causing left AKA  Cont CIR 2.Mechanical AVR/Antithrombotics: -DVT/anticoagulation:Pharmaceutical:Coumadin and heparin GGT INR> 2.5.  Unfractionated heparin within normal range on 9/20   INR 2.4 on 9/20   9/21- INR 2.6- pharmacy stopped Heparin gtt- will con't coumadin mgmt  9/22- INR 2.9  9/23- INR 3.1- per pharmacy  9/24- INR 3.3- per pharmacy  9/27- INR 3.6- per pharmacy  9/28- INR 3.2  9/29- INR 2.7  9/30- INR 2.6  10/1- INR 2.2- will try treatment dose lovenox x 24 hrs  Per pharmacy.   10/2- INR 2.6- stop lovenox  -antiplatelet therapy: N/A 3. Pain Management:  Scheduled low dose  flexeril tid to help with spasms.  May need neurontin titrate upwarrds. Scheduled tramadol 50 mg TID and will use oxy only when needs it.  Reasonable control 9/27 4. Mood:LCSW to follow for evaluation and support. -antipsychotic agents: N/A 5. Neuropsych: This patientiscapable of making decisions on hisown behalf. 6. Skin/Wound Care:Monitor wound daily. Monitor stump for signs and symptoms of infection  Daily dressings, drainage decreased  9/28- changed ACE wrap to TID per surgeon; ordered retention sock and shrinker for after d/c.  10/2- will con't rentetion sock and ACE wrap til 101/0- they will remove staples 10/10 per Dr Ainsley Spinner office 7. Fluids/Electrolytes/Nutrition:Monitor I/Os 8. T2DM: Monitor BS ac/hs. Continue Lantus 40 mg bid with Victoza  Resumed amaryl 2mg    9/15- increase Lantus to 44 units BID CBG (last 3)  Recent Labs    09/03/19 1631 09/03/19 2112 09/04/19 0615  GLUCAP 91 152* 86   Labile on 9/20, monitor for trend  9/21- BGs much better controlled  9/24- running low 200s- will try to increase lantus slightly  9/26- lantus increased to 47u bid   -changed  dto CM diet  9/27--add HS snack for am hypoglycemia  9/29- bgS 75-95- actually great control- will monitor  10/1- Bouncing somewhat- will monitor 9. CAD/CAF: Monitor for presyncope/syncope as back on Entresto.   Appreciate cards recs, metoprolol decreased  Dig level low on 9/18  Pulse pressure remains wide on 9/20  Heart rate improving on 9/20  9/21- Cards- decr Metoprolol and Digoxin- pt feeling much better   9/25- Digoxin stopped per Cardiology 10 Chronic systolic CHF: Heart healthy diet and monitor for signs of overload. Check weight daily. On Entresto, Demadex, metoprolol and fenofibrate, Vascepa.    Filed Weights   09/01/19 0543 09/03/19 0500 09/04/19 0500  Weight: 100.4 kg 100 kg 100 kg   Weights stable 10/1 11. Urinary retention:   PVRs  elevated  Continue Flomax  9/23- voiding "all night" per pt- which was new- didn't require cath o/n  9/26 voiding continently 12. Constipation:Start bowel program as this has been an issues since first surgery.   9/26--bm this morning 13.  Acute blood loss anemia  Transfused 1 unit PRBC on 9/16  Hemoglobin 8.6 on 9/20  Continue to monitor 13. Hyponatremia:   Sodium 135 on 9/17  9/23- Na 133  Continue to monitor 14. OSA: CPAP when sleeping with 2 L bleed in.  15. Acute on chronic renal failure:   Creatinine 1.66 on 9/17   Encourage fluids  9/21- Cr down to 1.4  9/23- Cr up to 1.69 and BUN up to 68- is dry, Cards held QHS torsemide  9/24 Cr 1.62 and BUN down to 57 after Torsemide held  9/29- Cr 1.59 and BUN 48 -holding steady.  9/30- recheck Friday  Continue to monitor 16.  Transaminitis  LFTs elevated on 9/17, labs ordered for tomorrow  9/21- BMP was ordered- will recheck [facility] Thursday  9/24- ALT/AST slightly elevated in 40s  9/30- recheck Friday  Continue to monitor 17. Lethargy/malaise  9/23- U/A (-) for UTI- CBC shows mild leukocytosis; of 11.4k can't just start ABX, unsure as to why- AKA looks OK, per nursing; might be cold/URI (is more nasal); could be dehydration since Cr up and BUN significantly up. Will check labs in AM and monitor more closely.  9/24- resolved   18. Dispo  9/25- will order 15/7 for therapy as of Monday- per OT request.- so pt doesn't do more than 30-45 minutes of therapy max at a time.  9/28- changed to 15/7; Cards agreed shouldn't do CABG, if possible, until gets prosthesis. Will need more assistance- pt aware.  9/29- discussed prosthesis issue with pt- happy to speak with wife if needed- left my office number.  9/30- discussed with wife today- 15 minutes in addition to rounds.  10/1- will call Dr Ainsley Spinner office, per pt request- Dr Carlis Abbott wants to remove staples/look at AKA himself.  10/2- will do 10/10- also has f/u 10/13 with Dr Carlis Abbott in office, on  day of d/c.   LOS: 18 days A FACE TO FACE EVALUATION WAS PERFORMED  Joseph Hoffman 09/04/2019, 9:29 AM

## 2019-09-04 NOTE — Progress Notes (Signed)
Occupational Therapy Session Note  Patient Details  Name: Joseph Hoffman MRN: [id number] Date of Birth: [birth year]  Today's Date: 09/04/2019 OT Individual Time: [id number] OT Individual Time Calculation (min): 45 min    Short Term Goals: Week 3:  OT Short Term Goal 1 (Week 3): Pt will be able to bathe LB with S OT Short Term Goal 2 (Week 3): Pt will complete toileting tasks with CGA standing for clothing management OT Short Term Goal 3 (Week 3): Pt will complete toilet transfers with CGA  Skilled Therapeutic Interventions/Progress Updates:    Pt seated EOB upon arrival with RN present.  OT intervention with focus on slide board transfers, stand pivot transfers with RW, bed mobility, w/c mobility, activity tolerance, and safety awareness to increase independence with BADLs.  Pt's bed at home is 27" and pt will perform stand pivot transfers when getting in/out.  Pt performs stand pivot transfers with CGA. Sit<>supine on mat without bed rails at supervisoin and extra time for technique.  Pt practiced w/c mobility in ADL apartment and recommendations made regarding kitchen and home setup.  Pt verbalized understanding.  Pt performs slide board transfers with supervison.  Pt returned to bed and remained seated EOB with bed alarm activated.  Therapy Documentation Precautions:  Precautions Precautions: Fall Precaution Comments: monitor HR, sats and BP Restrictions Weight Bearing Restrictions: Yes LLE Weight Bearing: Non weight bearing Other Position/Activity Restrictions: L AKA - in ace wrap.  no drainage seen through wrap Pain:  Pt denies pain this afternoon   Therapy/Group: Individual Therapy  [name] 09/04/2019, 2:57 PM

## 2019-09-04 NOTE — Patient Care Conference (Signed)
Inpatient RehabilitationTeam Conference and Plan of Care Update Date: 09/02/2019   Time: 10:10 AM    Patient Name: Joseph Hoffman      Medical Record Number: [id number]  Date of Birth: 10-Sep-1951 Sex: Male         Room/Bed: 4W21C/4W21C-01 Payor Info: Payor: MEDICARE / Plan: MEDICARE PART A AND B / Product Type: *No Product type* /    Admit Date/Time:  08/17/2019  5:57 PM  Primary Diagnosis:  Above knee amputation of left lower extremity [facility])  Patient Active Problem List   Diagnosis Date Noted  . Anxiety state   . Widened pulse pressure   . Transaminitis   . Acute blood loss anemia   . Labile blood glucose   . Above knee amputation of left lower extremity ([place]) 08/17/2019  . Nonhealing surgical wound 08/13/2019  . Non-healing amputation site ([place]) 08/13/2019  . Type 2 diabetes mellitus with peripheral neuropathy (HCC)   . Chronic combined systolic and diastolic CHF (congestive heart failure) ([place])   . Myalgia   . Urinary retention   . Insomnia due to medical condition   . Drug-induced constipation   . Anticoagulated on Coumadin   . Unilateral complete BKA, left, initial encounter ([place]) 08/05/2019  . Leukocytosis   . Acute on chronic anemia   . Post-operative pain   . Nausea & vomiting 07/29/2019  . Diarrhea 07/29/2019  . Diabetic ulcer of left foot associated with diabetes mellitus due to underlying condition ([place]) 07/29/2019  . Left ventricular ejection fraction of 30% to 35% 07/29/2019  . PAD (peripheral artery disease) ([place]) 07/28/2019  . Long term (current) use of anticoagulants 07/13/2019  . CAD, multiple vessel 07/10/2019  . Severe aortic regurgitation   . Aortic valve disease   . Foot ulcer, left ([place]) 06/26/2019  . Depression 05/28/2019  . Congestive heart failure (CHF) ([place]) 05/13/2019  . H/O heart valve replacement with mechanical valve   . Type II diabetes mellitus with renal manifestations ([place]) 05/12/2019  . Chest pain 05/12/2019  . Elevated troponin  05/12/2019  . Stage 3 chronic kidney disease 05/12/2019  . Acute on chronic systolic (congestive) heart failure ([place]) 05/12/2019  . CRI (chronic renal insufficiency), stage 3 (moderate) 04/09/2019  . Acute combined systolic and diastolic heart failure ([place]) 04/09/2019  . Essential hypertension 06/03/2017  . Dyslipidemia, goal LDL below 70 04/11/2017  . AS (aortic stenosis) 10/28/2012  . Acute on chronic systolic heart failure, re-admitted 10/12/12 10/12/2012  . S/P AVR, 09/29/12, [employer]. (discharged 10/05/12) 09/30/2012  . Permanent atrial fibrillation, since 1994 09/30/2012  . Type 2 IDDM 09/30/2012  . OSA on CPAP 09/30/2012  . Normal coronary arteries at cath Oct 2013 09/30/2012  . Chronic anticoagulation 09/30/2012  . Severe aortic stenosis 09/25/2012    Expected Discharge Date: Expected Discharge Date: 09/15/19  Team Members Present: Physician leading conference: Dr. Alysia Penna Social Worker Present: Alfonse Alpers, LCSW Nurse Present: Isla Pence, RN PT Present: Apolinar Junes, PT OT Present: Roanna Epley, [place], OT SLP Present: Other (comment)(Erin Tamala Julian, CF-SLP) PPS Coordinator present : Gunnar Fusi, SLP     Current Status/Progress Goal Weekly Team Focus  Bowel/Bladder   Patient is continent of B/B, LBM 09/02/19, has schedule and prn medications order  Maintain continence  Assess toilering needs QS/PRN provide assisting, encourage po intake with monitoring of 1800cc Fluid restrictions   Swallow/Nutrition/ Hydration             ADL's   sliding board transfers-CGA/S; sit<>stand from  EOB-min A; LB dressing-min A; toileting-mod A for clothing management;  currently mod I overall to be downgraded to S/CGA overall  ADL training, functional tranfsers, standing balance, limb care, education, activity tolerance   Mobility   Min A-supervision A bed mobility without rails, CGA sit <>stand, min A-CGA SBT and lateral scoot transfers, CGA A gait 6' in // bars  and 12' with RW  S-mod I w/c level, gait 10' into bathroom with RW  Amputee education, strengthening/ROM, balance, functional mobility, w/c mobility, activity tolerance, pain/edema control, patient/family education   Communication             Safety/Cognition/ Behavioral Observations            Pain   Patient has schedule Tramadol 50mg  TID,PRN Oxycodone IR 5-10 mg Q4 H, Tylenol 325-650mg  Q 4 HR, and Lidocaine gel, Last medicated with prn Oxycodone 10mg  08/31/19  Pain score 10/10  < = 3  Continue to assess s/p surgical pain Lf AKA QS/PRN with follow up assessment   Skin   S/P AKA surgical incision healing well no redness irritation, staples intact, daily dressing change- per orders          Rehab Goals Patient on target to meet rehab goals: Yes Rehab Goals Revised: none *See Care Plan and progress notes for long and short-term goals.     Barriers to Discharge  Current Status/Progress Possible Resolutions Date Resolved   Nursing                  PT  Home environment access/layout;Medical stability  1 small STE home; continues to be light headed intermittently in standing with low BP              OT                  SLP                SW                Discharge Planning/Teaching Needs:  Pt is progressing better this week and plans to return to his home at d/c.  Pt reports that family will come to support him and his wife, as she is scheduled for a knee replacement on 09-24-19.  Family education can occur closer to d/c once pt/wife know when family will arrive to help.   Team Discussion:  Pt with left AKA with staples.  He is continent x 2 and alert and oriented x4.  Pt's INR is being watched, as are blood sugars.  Pt had an assisted fall on Monday.  Pt is making good progress this week and is supervision with bed mobility and slide board transfers.  Pt is min A/S in standing; mod/max toileting.  Pt with good vitals in PT.  Can do unlevel slide board txs with min A.  Pt is min A to  do stand pivot txs and can walk 12' with min A/CGA with rolling walker.  Pt is almost mod I with w/c.  Pt has 10' to go into the bathroom at home.  Revisions to Treatment Plan:  none    Medical Summary Current Status: Pt's gained 2-4 lbs in last 2 days- rechecking labs to make sure can restart some Torsemide- rechecking elevated LFTs Weekly Focus/Goal: pain now controlled; BGs and weight  Barriers to Discharge: Decreased family/caregiver support;Behavior;Medication compliance;Neurogenic Bowel & Bladder;Home enviroment access/layout;Medical stability;Weight  Barriers to Discharge Comments: n/a Possible Resolutions to Barriers: family training, diurectics  Continued Need for Acute Rehabilitation Level of Care: The patient requires daily medical management by a physician with specialized training in physical medicine and rehabilitation for the following reasons: Direction of a multidisciplinary physical rehabilitation program to maximize functional independence : Yes Medical management of patient stability for increased activity during participation in an intensive rehabilitation regime.: Yes Analysis of laboratory values and/or radiology reports with any subsequent need for medication adjustment and/or medical intervention. : Yes   I attest that I was present, lead the team conference, and concur with the assessment and plan of the team.Team conference was held via web/ teleconference due to [place] - 19.   Aelyn Stanaland, Silvestre Mesi 09/04/2019, 1:17 AM

## 2019-09-04 NOTE — Progress Notes (Signed)
ANTICOAGULATION CONSULT NOTE - Follow Up Consult  Pharmacy Consult for warfarin Indication: Mechanical valve  Patient Measurements: Height: 5\' 11"  (180.3 cm) Weight: 220 lb 7.4 oz (100 kg) IBW/kg (Calculated) : 75.3  Vital Signs: Temp: 97.5 F (36.4 C) (10/02 0546) Temp Source: Oral (10/02 0546) BP: 125/43 (10/02 0546) Pulse Rate: 78 (10/02 0546)  Labs: Recent Labs    09/02/19 0519 09/03/19 0701 09/04/19 0641  HGB  --   --  9.5*  HCT  --   --  29.7*  PLT  --   --  302  LABPROT 27.2* 23.7* 27.2*  INR 2.6* 2.2* 2.6*  CREATININE  --   --  1.57*     Assessment: 68 year old male on warfarin for mechanical AVR who received INR reversal (Vitamin K 5mg  IV) on 9/9 for an AKA. Patient was on heparin as a bridge, warfarin was resumed per pharmacy dosing on 9/12. PTA warfarin dose: 10 mg daily except 12.5 mg on Tues/Thurs/Sat.   Goal of Therapy:  INR 2.5-3.5 Monitor platelets by anticoagulation protocol: Yes   Plan:  - INR at therapeutic goal  - Expect slight increase tomorrow - Will attempt to resume home regimen and give Warfarin 10 mg x 1 dose  - Continue with daily INR   Duanne Limerick, PharmD, BCPS Please see AMION for all pharmacy numbers 09/04/2019 8:59 AM

## 2019-09-04 NOTE — Progress Notes (Signed)
Recreational Therapy Session Note  Patient Details  Name: Joseph Hoffman MRN: [id number] Date of Birth: [birth year] Today's Date: 09/04/2019 Time:  09-1029 Pain: no c/o Skilled Therapeutic Interventions/Progress Updates: Session focused on review of relaxation training techniques/coping strategies in which pt continues to report are helpful in managing his anxiety.  Pt also shared that the recommendation of note taking/journaling has been effective as well.  Offered and administer aromatherapy to pt as he states this is helpful as well.  2 drops of lavender essential oil was applied to a cotton ball and placed in a cup on bedside table for inhalation in the room.  Pt stated understanding and is agreement with the above.  P stated he has some anxiety about staple removal.  Reviewed the use of relaxation strategies and how to utilize them during staple removal.  Pt stated understanding and intentions to use at that time.   Therapy/Group: Individual Therapy Triana Coover 09/04/2019, 11:35 AM

## 2019-09-04 NOTE — Progress Notes (Signed)
Physical Therapy Session Note  Patient Details  Name: AYAANSH JURAS MRN: [id number] Date of Birth: 1950-12-22  Today's Date: 09/04/2019 PT Individual Time: 0800-0845 PT Individual Time Calculation (min): 45 min   Short Term Goals: Week 3:  PT Short Term Goal 1 (Week 3): Patient will perform bed mobility with supervision. PT Short Term Goal 2 (Week 3): Patient will be independent with HEP. PT Short Term Goal 3 (Week 3): Patient will perform level transfers with CGA. PT Short Term Goal 4 (Week 3): Patient will perform unlevel transfers with min A. PT Short Term Goal 5 (Week 3): Patient will ambulate 15 feet with CGA using LRAD.  Skilled Therapeutic Interventions/Progress Updates:     Patient in w/c completing bathing upon PT arrival. Patient alert and agreeable to PT session, requested PT to assist with washing his back at beginning of session. Denied pain this session.   Therapeutic Activity: PT provided total A for washing patient's back and applying lotion to his back due to dryness. Patient donned his boxers and shorts with supervision, threading B LEs through and pulling them up in standing with CGA for balance using the RW. PT demonstrated and instructed patient in wrapping his residual limb with 2-4" ACE wraps with min A sitting in the w/c.  Bed Mobility: Patient performed sit to supine with mod I for increased time in a flat bed without use of bed rails.  Transfers: Patient performed sit to/from stand x2 with CGA for safety/balance. Patient was able to teach back hand placement on RW and leaning forward to stand from previous sessions. He performed a slide board transfer with supervision to place the board and perform a level transfer from w/c>bed. Provided cues for correct board placement and hand positioning, patient able to teach back head-hips relationship.  Gait Training:  Patient ambulated 25 feet using RW with CGA and a w/c follow. Ambulated with hop-to gait pattern on R LE  with decreased R foot clearance x4. Provided verbal cues for increased UE use to assist with R foot clearance.  Wheelchair Mobility:  Patient propelled wheelchair 150 feet and 100 feet with mod I. Increased SOB after with seated rest breaks following to recover.  Therapeutic Exercise: Patient performed the following exercises with verbal and tactile cues for proper technique. R side lying hip extension stretch 2x5 with 10 sec holds, educated on performing at least 3x per day for increased hip extension  Patient in bed at end of session with breaks locked, bed alarm set, and all needs within reach.    Therapy Documentation Precautions:  Precautions Precautions: Fall Precaution Comments: monitor HR, sats and BP Restrictions Weight Bearing Restrictions: Yes LLE Weight Bearing: Non weight bearing Other Position/Activity Restrictions: L AKA - in ace wrap.  no drainage seen through wrap    Therapy/Group: Individual Therapy  Laylaa Guevarra L Ketra Duchesne PT, DPT  09/04/2019, 12:51 PM

## 2019-09-04 NOTE — Progress Notes (Signed)
Occupational Therapy Session Note  Patient Details  Name: Joseph Hoffman MRN: [id number] Date of Birth: 06/30/1951  Today's Date: 09/04/2019 OT Individual Time: 1115-1200 OT Individual Time Calculation (min): 45 min    Short Term Goals: Week 3:  OT Short Term Goal 1 (Week 3): Pt will be able to bathe LB with S OT Short Term Goal 2 (Week 3): Pt will complete toileting tasks with CGA standing for clothing management OT Short Term Goal 3 (Week 3): Pt will complete toilet transfers with CGA  Skilled Therapeutic Interventions/Progress Updates:    Pt seated EOB upon arrival. Pt requested use of toilet and performed slide board transfer with supervision.  Pt propelled into bathroom and performed stand pivot tranfser with RW to [facility] over toilet with CGA.  Pt required CGA for clothing management. Pt propelled w/c to gym and engaged in standing tasks using RUE to complete table tasks.  Pt commented that standing to perform task is much more challenging then "just standing." Pt completed 2 simple pipe tree structures with 2 rest breaks.  Pt performed sliding board transfer back to w/c with supervisoin including board placement.  Pt returned to room and tranfserred back to EOB.  Pt remained seated EOB with all needs within reach and bed alarm activated.   Therapy Documentation Precautions:  Precautions Precautions: Fall Precaution Comments: monitor HR, sats and BP Restrictions Weight Bearing Restrictions: Yes LLE Weight Bearing: Non weight bearing Other Position/Activity Restrictions: L AKA - in ace wrap.  no drainage seen through wrap  Pain:  Pt states his pain is "good to go"  Therapy/Group: Individual Therapy  [name] 09/04/2019, 12:08 PM

## 2019-09-05 ENCOUNTER — Inpatient Hospital Stay (HOSPITAL_COMMUNITY): Payer: Medicare Other

## 2019-09-05 ENCOUNTER — Inpatient Hospital Stay (HOSPITAL_COMMUNITY): Payer: Medicare Other | Admitting: Physical Therapy

## 2019-09-05 LAB — PROTIME-INR
INR: 2.3 — ABNORMAL HIGH (ref 0.8–1.2)
Prothrombin Time: 25.1 seconds — ABNORMAL HIGH (ref 11.4–15.2)

## 2019-09-05 MED ORDER — WARFARIN SODIUM 7.5 MG PO TABS
12.5000 mg | ORAL_TABLET | Freq: Once | ORAL | Status: AC
  Administered 2019-09-05: 19:00:00 12.5 mg via ORAL
  Filled 2019-09-05: qty 1

## 2019-09-05 MED ORDER — ENOXAPARIN SODIUM 100 MG/ML ~~LOC~~ SOLN
100.0000 mg | Freq: Two times a day (BID) | SUBCUTANEOUS | Status: AC
  Administered 2019-09-05 – 2019-09-06 (×2): 100 mg via SUBCUTANEOUS
  Filled 2019-09-05 (×3): qty 1

## 2019-09-05 NOTE — Progress Notes (Signed)
Physical Therapy Session Note  Patient Details  Name: Joseph Hoffman MRN: [id number] Date of Birth: [birth year]  Today's Date: 09/05/2019 PT Individual Time: 1008-1057 PT Individual Time Calculation (min): 49 min   Short Term Goals: Week 3:  PT Short Term Goal 1 (Week 3): Patient will perform bed mobility with supervision. PT Short Term Goal 2 (Week 3): Patient will be independent with HEP. PT Short Term Goal 3 (Week 3): Patient will perform level transfers with CGA. PT Short Term Goal 4 (Week 3): Patient will perform unlevel transfers with min A. PT Short Term Goal 5 (Week 3): Patient will ambulate 15 feet with CGA using LRAD.  Skilled Therapeutic Interventions/Progress Updates:    Pt received supine in bed and eager to participate in therapy session requesting to bath at sink. Supine>sit, HOB partially elevated and using bedrails, with supervision. Sit<>stands using RW with CGA for steadying throughout session. Stand pivot transfers bed<>w/c and w/c<>BSC over toilet using RW with CGA for steadying throughout session - pt demonstrated good safety awareness of proper set-up for transfers. Pt continent of BM smear (unable to urinate) and performed peri-care with set-up assist. Performed B UE and R LE w/c propulsion mod-I throughout room to collect clothes and set-up for bathing task at sink. Performed bathing task sitting in w/c at sink with set-up assist and assist for washing back, and R LE in sitting as well as posterior peri-region while pt stood with RW and CGA for steadying. Pt donned UB clothing sitting and LB clothing sit<>stand using RW with CGA and standing with CGA while pulling shorts over hips. MD in/out for morning rounds. Pt requesting to get back in bed and performed stand pivot transfer using RW with CGA. Pt left sitting EOB with needs in reach and bed alarm on.  Therapy Documentation Precautions:  Precautions Precautions: Fall Precaution Comments: monitor HR, sats and  BP Restrictions Weight Bearing Restrictions: Yes LLE Weight Bearing: Non weight bearing Other Position/Activity Restrictions: L AKA - in ace wrap.  no drainage seen through wrap  Pain:   No reports of pain during session.   Therapy/Group: Individual Therapy  [name], PT, DPT 09/05/2019, 7:54 AM

## 2019-09-05 NOTE — Progress Notes (Signed)
[place] PHYSICAL MEDICINE & REHABILITATION PROGRESS NOTE   Subjective/Complaints:   Pt reports had a great day yesterday and going OK today except has had 3 cups of coffee, is "wired" and can't "pee".  Otherwise is "fantastic"- explained 3 cups of coffee can irritate the bladder and make it HARDER to void.  Denies any cardiac Symptoms or SOB.  ROS: Patient denies fever, rash, sore throat, blurred vision, nausea, vomiting, diarrhea, cough, shortness of breath or chest pain,  headache, or mood change.    Objective:   No results found. Recent Labs    09/04/19 0641  WBC 9.2  HGB 9.5*  HCT 29.7*  PLT 302   Recent Labs    09/04/19 0641  NA 137  K 4.1  CL 99  CO2 27  GLUCOSE 82  BUN 43*  CREATININE 1.57*  CALCIUM 9.2    Intake/Output Summary (Last 24 hours) at 09/05/2019 1521 Last data filed at 09/05/2019 0850 Gross per 24 hour  Intake 720 ml  Output 1075 ml  Net -355 ml     Physical Exam: Vital Signs Blood pressure (!) 110/53, pulse (!) 56, temperature 98.3 F (36.8 C), temperature source Oral, resp. rate 18, height 5\' 11"  (1.803 m), weight 101.3 kg, SpO2 99 %. Constitutional: No distress . Vital signs and labs and pharmacy notes reviewed. Sitting up in manual w/c, in room;bathing at sink with PT at bedside, NAD; is "wired" HEENT: EOMI, oral membranes moist Neck: supple Cardiovascular: RRR    (+click)  Respiratory: CTA Bilaterally without wheezes or rales. Normal effort    GI: BS +, non-tender, non-distended    Psych: alert and pleasant Musc: Left AKA with edema and tenderness- wrapped in ACE wrap- looks great wrapping Skin: incision/leg dressed Neurological: Alert Motor: Bilateral upper extremities: 5/5 proximal distal Right lower extremity: 4/5 proximal distal  Left lower extremity: 4+/5 (pain inhibition)    Assessment/Plan: 1. Functional deficits secondary to new L AKA due to ischemia/gangrene which require 3+ hours per day of interdisciplinary  therapy in a comprehensive inpatient rehab setting.  Physiatrist is providing close team supervision and 24 hour management of active medical problems listed below.  Physiatrist and rehab team continue to assess barriers to discharge/monitor patient progress toward functional and medical goals  Care Tool:  Bathing    Body parts bathed by patient: Right arm, Left arm, Chest, Abdomen, Front perineal area, Right upper leg, Right lower leg, Face   Body parts bathed by helper: Buttocks Body parts n/a: Left lower leg, Left upper leg   Bathing assist Assist Level: Minimal Assistance - Patient > 75%     Upper Body Dressing/Undressing Upper body dressing   What is the patient wearing?: Pull over shirt    Upper body assist Assist Level: Independent    Lower Body Dressing/Undressing Lower body dressing      What is the patient wearing?: Underwear/pull up, Pants     Lower body assist Assist for lower body dressing: Minimal Assistance - Patient > 75%     Toileting Toileting    Toileting assist Assist for toileting: Contact Guard/Touching assist Assistive Device Comment: bedpan   Transfers Chair/bed transfer  Transfers assist  Chair/bed transfer activity did not occur: Safety/medical concerns  Chair/bed transfer assist level: Contact Guard/Touching assist Chair/bed transfer assistive device: Programmer, multimedia   Ambulation assist   Ambulation activity did not occur: Safety/medical concerns(labial BP and HR sitting EOB.)  Assist level: Contact Guard/Touching assist Assistive device: Walker-rolling Max distance:  25'   Walk 10 feet activity   Assist  Walk 10 feet activity did not occur: Safety/medical concerns(labial BP and HR sitting EOB.)  Assist level: Contact Guard/Touching assist Assistive device: Walker-rolling   Walk 50 feet activity   Assist Walk 50 feet with 2 turns activity did not occur: Safety/medical concerns(labial BP and HR sitting  EOB.)         Walk 150 feet activity   Assist Walk 150 feet activity did not occur: Safety/medical concerns(labial BP and HR sitting EOB.)         Walk 10 feet on uneven surface  activity   Assist Walk 10 feet on uneven surfaces activity did not occur: Safety/medical concerns(labial BP and HR sitting EOB.)         Wheelchair     Assist Will patient use wheelchair at discharge?: Yes Type of Wheelchair: Manual Wheelchair activity did not occur: Safety/medical concerns(labial BP and HR sitting EOB.)  Wheelchair assist level: Supervision/Verbal cueing Max wheelchair distance: 150'    Wheelchair 50 feet with 2 turns activity    Assist    Wheelchair 50 feet with 2 turns activity did not occur: Safety/medical concerns(labial BP and HR sitting EOB.)   Assist Level: Supervision/Verbal cueing   Wheelchair 150 feet activity     Assist  Wheelchair 150 feet activity did not occur: Safety/medical concerns(labial BP and HR sitting EOB.)   Assist Level: Supervision/Verbal cueing   Blood pressure (!) 110/53, pulse (!) 56, temperature 98.3 F (36.8 C), temperature source Oral, resp. rate 18, height 5\' 11"  (1.803 m), weight 101.3 kg, SpO2 99 %.  Medical Problem List and Plan: 1.Fxnl and mobility deficitssecondary to PAD causing left AKA  Cont CIR 2.Mechanical AVR/Antithrombotics: -DVT/anticoagulation:Pharmaceutical:Coumadin and heparin GGT INR> 2.5.  Unfractionated heparin within normal range on 9/20   INR 2.4 on 9/20   9/21- INR 2.6- pharmacy stopped Heparin gtt- will con't coumadin mgmt  9/22- INR 2.9  9/23- INR 3.1- per pharmacy  9/24- INR 3.3- per pharmacy  9/27- INR 3.6- per pharmacy  9/28- INR 3.2  9/29- INR 2.7  9/30- INR 2.6  10/1- INR 2.2- will try treatment dose lovenox x 24 hrs  Per pharmacy.   10/2- INR 2.6- stop lovenox  10/3- INR 2.3- restarted treatment dose lovenox  -antiplatelet therapy: N/A 3. Pain  Management:  Scheduled low dose flexeril tid to help with spasms.  May need neurontin titrate upwarrds. Scheduled tramadol 50 mg TID and will use oxy only when needs it.  Reasonable control 10/3 4. Mood:LCSW to follow for evaluation and support. -antipsychotic agents: N/A 5. Neuropsych: This patientiscapable of making decisions on hisown behalf. 6. Skin/Wound Care:Monitor wound daily. Monitor stump for signs and symptoms of infection  Daily dressings, drainage decreased  9/28- changed ACE wrap to TID per surgeon; ordered retention sock and shrinker for after d/c.  10/2- will con't rentetion sock and ACE wrap til 101/0- they will remove staples 10/10 per Dr Ainsley Spinner office 7. Fluids/Electrolytes/Nutrition:Monitor I/Os 8. T2DM: Monitor BS ac/hs. Continue Lantus 40 mg bid with Victoza  Resumed amaryl 2mg    9/15- increase Lantus to 44 units BID CBG (last 3)  Recent Labs    09/03/19 2112 09/04/19 0615 09/04/19 1156  GLUCAP 152* 86 173*   Labile on 9/20, monitor for trend  9/21- BGs much better controlled  9/24- running low 200s- will try to increase lantus slightly  9/26- lantus increased to 47u bid   -changed dto CM diet  9/27--add HS snack  for am hypoglycemia  9/29- bgS 75-95- actually great control- will monitor  10/1- Bouncing somewhat- will monitor 9. CAD/CAF: Monitor for presyncope/syncope as back on Entresto.   Appreciate cards recs, metoprolol decreased  Dig level low on 9/18  Pulse pressure remains wide on 9/20  Heart rate improving on 9/20  9/21- Cards- decr Metoprolol and Digoxin- pt feeling much better   9/25- Digoxin stopped per Cardiology 10 Chronic systolic CHF: Heart healthy diet and monitor for signs of overload. Check weight daily. On Entresto, Demadex, metoprolol and fenofibrate, Vascepa.    Filed Weights   09/03/19 0500 09/04/19 0500 09/05/19 0500  Weight: 100 kg 100 kg 101.3 kg   Weights stable 10/3 11.  Urinary retention:   PVRs elevated  Continue Flomax  9/23- voiding "all night" per pt- which was new- didn't require cath  9/26 voiding continently  10/3- cannot void this AM- will monitor if needs caths again. 12. Constipation:Start bowel program as this has been an issues since first surgery.   9/26--bm this morning  10/3- denies issues 13.  Acute blood loss anemia  Transfused 1 unit PRBC on 9/16  Hemoglobin 8.6 on 9/20  Continue to monitor 13. Hyponatremia:   Sodium 135 on 9/17  9/23- Na 133  Continue to monitor 14. OSA: CPAP when sleeping with 2 L bleed in.  15. Acute on chronic renal failure:   Creatinine 1.66 on 9/17   Encourage fluids  9/21- Cr down to 1.4  9/23- Cr up to 1.69 and BUN up to 68- is dry, Cards held QHS torsemide  9/24 Cr 1.62 and BUN down to 57 after Torsemide held  9/29- Cr 1.59 and BUN 48 -holding steady.  9/30- recheck Friday  Continue to monitor 16.  Transaminitis  LFTs elevated on 9/17, labs ordered for tomorrow  9/21- BMP was ordered- will recheck [facility] Thursday  9/24- ALT/AST slightly elevated in 40s  9/30- recheck Friday  Continue to monitor 17. Lethargy/malaise  9/23- U/A (-) for UTI- CBC shows mild leukocytosis; of 11.4k can't just start ABX, unsure as to why- AKA looks OK, per nursing; might be cold/URI (is more nasal); could be dehydration since Cr up and BUN significantly up. Will check labs in AM and monitor more closely.  9/24- resolved   18. Dispo  9/25- will order 15/7 for therapy as of Monday- per OT request.- so pt doesn't do more than 30-45 minutes of therapy max at a time.  9/28- changed to 15/7; Cards agreed shouldn't do CABG, if possible, until gets prosthesis. Will need more assistance- pt aware.  9/29- discussed prosthesis issue with pt- happy to speak with wife if needed- left my office number.  9/30- discussed with wife today- 15 minutes in addition to rounds.  10/1- will call Dr Ainsley Spinner office, per pt request- Dr Carlis Abbott wants to  remove staples/look at AKA himself.  10/2- will do 10/10- also has f/u 10/13 with Dr Carlis Abbott in office, on day of d/c.   LOS: 19 days A FACE TO FACE EVALUATION WAS PERFORMED  Joseph Hoffman 09/05/2019, 3:21 PM

## 2019-09-05 NOTE — Progress Notes (Signed)
ANTICOAGULATION CONSULT NOTE - Follow Up Consult  Pharmacy Consult for warfarin Indication: Mechanical AVR  Patient Measurements: Height: 5\' 11"  (180.3 cm) Weight: 223 lb 5.2 oz (101.3 kg) IBW/kg (Calculated) : 75.3  Vital Signs: Temp: 97.8 F (36.6 C) (10/03 0522) Temp Source: Oral (10/03 0522) BP: 112/48 (10/03 0522) Pulse Rate: 61 (10/03 0522)  Labs: Recent Labs    09/03/19 0701 09/04/19 0641 09/05/19 1014  HGB  --  9.5*  --   HCT  --  29.7*  --   PLT  --  302  --   LABPROT 23.7* 27.2* 25.1*  INR 2.2* 2.6* 2.3*  CREATININE  --  1.57*  --      Assessment: 68 year old male on warfarin for mechanical AVR who received INR reversal (Vitamin K 5mg  IV) on 9/9 for an AKA. Patient was on heparin as a bridge, warfarin was resumed per pharmacy dosing on 9/12. PTA warfarin dose: 10 mg daily except 12.5 mg on Tues/Thurs/Sat. INR today is SUBtherapeutic at 2.3. The physician added Lovenox as a bridge to therapeutic INR.    Goal of Therapy:  INR 2.5-3.5 Monitor platelets by anticoagulation protocol: Yes   Plan:  - Warfarin 12.5 mg po x1 - Daily INR - Discontinue Lovenox when INR is >/ 2.5   [name] 09/05/2019 1:03 PM

## 2019-09-05 NOTE — Progress Notes (Signed)
Occupational Therapy Session Note  Patient Details  Name: Joseph Hoffman MRN: [id number] Date of Birth: 12-15-1950  Today's Date: 09/05/2019 OT Individual Time: 0375-4360 OT Individual Time Calculation (min): 40 min    Short Term Goals: Week 3:  OT Short Term Goal 1 (Week 3): Pt will be able to bathe LB with S OT Short Term Goal 2 (Week 3): Pt will complete toileting tasks with CGA standing for clothing management OT Short Term Goal 3 (Week 3): Pt will complete toilet transfers with CGA  Skilled Therapeutic Interventions/Progress Updates:    Pt received sitting in w/c ready for therapy. Pt completed 150 ft of w/c propulsion with (S). Pt used RW to complete stand pivot transfer to the mat with CGA. Pt sat EOM and completed BUE strengthening circuit with 5 # dumbbells with instructions provided for technique and encouragement. Pt required intermittent rest breaks between each exercise. Exercises included shoulder raises to 90 degrees. Shoulder press, and cross body punches to simulate functional reaching. Pt returned to his w/c and to his room. Pt loaded his new clothes from his wife into his dresser with good trunk control while reaching distally. Pt completed a stand pivot transfer back to bed and was left with all needs met.   Therapy Documentation Precautions:  Precautions Precautions: Fall Precaution Comments: monitor HR, sats and BP Restrictions Weight Bearing Restrictions: Yes LLE Weight Bearing: Non weight bearing Other Position/Activity Restrictions: L AKA - in ace wrap.  no drainage seen through wrap   Therapy/Group: Individual Therapy  Curtis Sites 09/05/2019, 7:29 AM

## 2019-09-05 NOTE — Progress Notes (Signed)
Physical Therapy Session Note  Patient Details  Name: Joseph Hoffman MRN: [id number] Date of Birth: [birth year]  Today's Date: 09/05/2019 PT Individual Time: [id number] PT Individual Time Calculation (min): 45 min   Short Term Goals: Week 3:  PT Short Term Goal 1 (Week 3): Patient will perform bed mobility with supervision. PT Short Term Goal 2 (Week 3): Patient will be independent with HEP. PT Short Term Goal 3 (Week 3): Patient will perform level transfers with CGA. PT Short Term Goal 4 (Week 3): Patient will perform unlevel transfers with min A. PT Short Term Goal 5 (Week 3): Patient will ambulate 15 feet with CGA using LRAD.  Skilled Therapeutic Interventions/Progress Updates:    Pt received seated EOB, requesting to use the bathroom. Stand pivot transfer bed to w/c then w/c to elevated BSC over toilet with CGA and RW. Pt in continent of urine while seated on toilet. Pt is mod A for clothing management. Stand pivot transfer back to w/c with CGA and RW. Manual w/c propulsion 2 150 ft with use of BUE and RLE with Supervision. Stand pivot transfer w/c to mat table with RW and CGA. Bed mobility Supervision. Supine and sidelying BLE strengthening therex: SLR, hip abd, SL hip ext, SL hip abd x 15 reps each. Supine to sitting EOB with min A for trunk control. Stand pivot transfer back to w/c with RW and CGA. Pt left seated in w/c in room setup to finish eating lunch, needs in reach.  Therapy Documentation Precautions:  Precautions Precautions: Fall Precaution Comments: monitor HR, sats and BP Restrictions Weight Bearing Restrictions: Yes LLE Weight Bearing: Non weight bearing Other Position/Activity Restrictions: L AKA - in ace wrap.  no drainage seen through wrap    Therapy/Group: Individual Therapy   Excell Seltzer, PT, DPT  09/05/2019, 2:18 PM

## 2019-09-06 ENCOUNTER — Inpatient Hospital Stay (HOSPITAL_COMMUNITY): Payer: Medicare Other

## 2019-09-06 LAB — GLUCOSE, CAPILLARY
Glucose-Capillary: 119 mg/dL — ABNORMAL HIGH (ref 70–99)
Glucose-Capillary: 276 mg/dL — ABNORMAL HIGH (ref 70–99)
Glucose-Capillary: 70 mg/dL (ref 70–99)
Glucose-Capillary: 99 mg/dL (ref 70–99)
Glucose-Capillary: 123 mg/dL — ABNORMAL HIGH (ref 70–99)
Glucose-Capillary: 126 mg/dL — ABNORMAL HIGH (ref 70–99)
Glucose-Capillary: 104 mg/dL — ABNORMAL HIGH (ref 70–99)
Glucose-Capillary: 52 mg/dL — ABNORMAL LOW (ref 70–99)
Glucose-Capillary: 54 mg/dL — ABNORMAL LOW (ref 70–99)

## 2019-09-06 LAB — PROTIME-INR
INR: 2.7 — ABNORMAL HIGH (ref 0.8–1.2)
Prothrombin Time: 28.2 seconds — ABNORMAL HIGH (ref 11.4–15.2)

## 2019-09-06 MED ORDER — WARFARIN SODIUM 5 MG PO TABS
10.0000 mg | ORAL_TABLET | Freq: Once | ORAL | Status: AC
  Administered 2019-09-06: 10 mg via ORAL
  Filled 2019-09-06: qty 2

## 2019-09-06 MED ORDER — INSULIN GLARGINE 100 UNIT/ML ~~LOC~~ SOLN
44.0000 [IU] | Freq: Two times a day (BID) | SUBCUTANEOUS | Status: DC
  Administered 2019-09-06 – 2019-09-07 (×2): 44 [IU] via SUBCUTANEOUS
  Filled 2019-09-06 (×5): qty 0.44

## 2019-09-06 NOTE — Progress Notes (Signed)
Physical Therapy Session Note  Patient Details  Name: Joseph Hoffman MRN: [id number] Date of Birth: 1951/07/31  Today's Date: 09/06/2019 PT Individual Time: 0800-0900 PT Individual Time Calculation (min): 60 min   Short Term Goals: Week 3:  PT Short Term Goal 1 (Week 3): Patient will perform bed mobility with supervision. PT Short Term Goal 2 (Week 3): Patient will be independent with HEP. PT Short Term Goal 3 (Week 3): Patient will perform level transfers with CGA. PT Short Term Goal 4 (Week 3): Patient will perform unlevel transfers with min A. PT Short Term Goal 5 (Week 3): Patient will ambulate 15 feet with CGA using LRAD.  Skilled Therapeutic Interventions/Progress Updates:     Patient in w/c upon PT arrival. Patient alert and agreeable to PT session. Patient reported he had low blood glucose, 54, this morning and that he had eaten a lot of food. Most recent CBG, 104. Denied pain throughout session.   Therapeutic Activity: Patient donned shorts with supervision sitting in the w/c and standing with the RW with CGA for balance to pull them up. PT donned R sock and tennis shoe during session with total A for energy conservation/time management.  Patient performed limb wrapping with 1-4" and 1-6" ACE wrap during session with min A and cues, and was able to teach back that it should be re-wrapped every 4-6 hours for edema control. Bed Mobility: Patient performed rolling R/L and sit to supine on a mat table with supervision for safety and min A for supine to sit due to increased UE fatigue from therapies yesterday. Provided verbal cues for rolling to his L side to set his elbow to push to sit up. Transfers: Patient performed sit to/from stand x1, stand pivot x1, and squat pivot x1 with CGA for safety/balance. Provided verbal cues for hand placement and head-hips relationship for squat pivot transfer, and pivot technique with R foot during stand pivot.  Wheelchair Mobility:  Patient propelled  wheelchair ~100 feet x2 with mod I. Patient was able to teach back and demonstrate donning/doffing R leg rest today without cues.  Therapeutic Exercise: Patient performed one set the following exercises, provided with HEP handout during session, with verbal and tactile cues for proper technique. Supine Active Straight Leg Raise - 15 reps - 2 sets - 3x daily - 7x weekly  Supine Isometric Hip Adduction with Towel Roll (AKA) - 15 reps - 2 sets - 3x daily - 7x weekly  Supine Single Leg Bridge with Sound Leg (BKA) - 15 reps - 2 sets - 3x daily - 7x weekly  Sidelying Hip Abduction (AKA) - 15 reps - 2 sets - 3x daily - 7x weekly  Sidelying Hip Extension in Abduction with Knee Bent - 5 reps - 2 sets - 10 sec hold - 1x daily - 7x weekly   Patient in w/c at end of session with breaks locked and all needs within reach. Educated patient on POC and upgraded LTG during session. Patient in agreement with PT goals and is feeling much more confident about his progress and ability to do things at home by d/c.    Therapy Documentation Precautions:  Precautions Precautions: Fall Precaution Comments: monitor HR, sats and BP Restrictions Weight Bearing Restrictions: Yes LLE Weight Bearing: Non weight bearing Other Position/Activity Restrictions: L AKA - in ace wrap.  no drainage seen through wrap    Therapy/Group: Individual Therapy  Areli Jowett L Ellia Knowlton PT, DPT  09/06/2019, 12:16 PM

## 2019-09-06 NOTE — Progress Notes (Signed)
Occupational Therapy Session Note  Patient Details  Name: Joseph Hoffman MRN: [id number] Date of Birth: 1951-04-22  Today's Date: 09/06/2019 OT Individual Time: [id number] OT Individual Time Calculation (min): 55 min    Short Term Goals: Week 3:  OT Short Term Goal 1 (Week 3): Pt will be able to bathe LB with S OT Short Term Goal 2 (Week 3): Pt will complete toileting tasks with CGA standing for clothing management OT Short Term Goal 3 (Week 3): Pt will complete toilet transfers with CGA  Skilled Therapeutic Interventions/Progress Updates:    OT intervention with focus on BADL training, bed mobility, functional transfers, sit<>stand, standing balance, w/c mobility, activity tolerance, and safety awareness to increase independence with BADLs. Pt resting in bed upon arrival and requested use of toilet before bathing at sink level. All functional transfers, sit<>stand, and standing balance with CGA. Pt requires min verbal cues to lock brakes on w/c when stationery. Toileting with CGA.  LB bathing/dressing with CGA. Pt surprised he did well this session since he stated he was "hurting" from previous day's therapy.  Pt transferred back to bed and remained seated EOB with bed alarm activated and all needs within reach.   Therapy Documentation Precautions:  Precautions Precautions: Fall Precaution Comments: monitor HR, sats and BP Restrictions Weight Bearing Restrictions: Yes LLE Weight Bearing: Non weight bearing Other Position/Activity Restrictions: L AKA - in ace wrap.  no drainage seen through wrap    Pain: Pain Assessment Pain Scale: 0-10 Pain Score: 10-Worst pain ever Faces Pain Scale: Hurts little more Pain Type: Chronic pain Pain Location: Hip Pain Orientation: Left Pain Descriptors / Indicators: Aching;Throbbing Pain Frequency: Rarely Pain Onset: Sudden Patients Stated Pain Goal: 2 Pain Intervention(s): repositioned  Therapy/Group: Individual Therapy  [name] 09/06/2019, 12:20 PM

## 2019-09-06 NOTE — Plan of Care (Signed)
  Problem: Consults Goal: RH LIMB LOSS PATIENT EDUCATION Description: Description: See Patient Education module for eduction specifics. Outcome: Progressing Goal: Skin Care Protocol Initiated - if Braden Score 18 or less Description: If consults are not indicated, leave blank or document N/A Outcome: Progressing   Problem: RH BOWEL ELIMINATION Goal: RH STG MANAGE BOWEL WITH ASSISTANCE Description: STG Manage Bowel with  mod I Assistance. Outcome: Progressing Goal: RH STG MANAGE BOWEL W/MEDICATION W/ASSISTANCE Description: STG Manage Bowel with Medication with mod I Assistance. Outcome: Progressing   Problem: RH BLADDER ELIMINATION Goal: RH STG MANAGE BLADDER WITH ASSISTANCE Description: STG Manage Bladder With min Assistance Outcome: Progressing Goal: RH STG MANAGE BLADDER WITH MEDICATION WITH ASSISTANCE Description: STG Manage Bladder With Medication With  mod I Assistance. Outcome: Progressing   Problem: RH SKIN INTEGRITY Goal: RH STG SKIN FREE OF INFECTION/BREAKDOWN Description: With min assist Outcome: Progressing Goal: RH STG MAINTAIN SKIN INTEGRITY WITH ASSISTANCE Description: STG Maintain Skin Integrity With  min Assistance. Outcome: Progressing Goal: RH STG ABLE TO PERFORM INCISION/WOUND CARE W/ASSISTANCE Description: STG Able To Perform Incision/Wound Care With min Assistance. Outcome: Progressing   Problem: RH SAFETY Goal: RH STG ADHERE TO SAFETY PRECAUTIONS W/ASSISTANCE/DEVICE Description: STG Adhere to Safety Precautions With cues/reminders Assistance/Device. Outcome: Progressing   Problem: RH PAIN MANAGEMENT Goal: RH STG PAIN MANAGED AT OR BELOW PT'S PAIN GOAL Outcome: Progressing   Problem: RH KNOWLEDGE DEFICIT LIMB LOSS Goal: RH STG INCREASE KNOWLEDGE OF SELF CARE AFTER LIMB LOSS Outcome: Progressing

## 2019-09-06 NOTE — Progress Notes (Signed)
[facility]ICAL MEDICINE & REHABILITATION PROGRESS NOTE   Subjective/Complaints:   Pt reports tired- PT and OT "worked his butt off"- Had a BG this AM of 52- before exercise.    ROS: Patient denies fever, rash, sore throat, blurred vision, nausea, vomiting, diarrhea, cough, shortness of breath or chest pain,  headache, or mood change.    Objective:   No results found. Recent Labs    09/04/19 0641  WBC 9.2  HGB 9.5*  HCT 29.7*  PLT 302   Recent Labs    09/04/19 0641  NA 137  K 4.1  CL 99  CO2 27  GLUCOSE 82  BUN 43*  CREATININE 1.57*  CALCIUM 9.2    Intake/Output Summary (Last 24 hours) at 09/06/2019 1513 Last data filed at 09/06/2019 1300 Gross per 24 hour  Intake 1320 ml  Output 700 ml  Net 620 ml     Physical Exam: Vital Signs Blood pressure 113/68, pulse 66, temperature 97.9 F (36.6 C), temperature source Oral, resp. rate 18, height 5\' 11"  (1.803 m), weight 101.6 kg, SpO2 97 %. Constitutional: No distress . Vital signs and labs and pharmacy notes reviewed. Sitting up in bed; bright affect, but tired- not "wired' like yesterday, NAD HEENT: EOMI, oral membranes moist Neck: supple Cardiovascular: RRR    (+click)  Respiratory: CTA Bilaterally without wheezes or rales. Normal effort    GI: BS +, non-tender, non-distended    Psych: alert and pleasant Musc: Left AKA with edema and tenderness- wrapped in ACE wrap- looks great wrapping Skin: incision/leg dressed Neurological: Alert Motor: Bilateral upper extremities: 5/5 proximal distal Right lower extremity: 4/5 proximal distal  Left lower extremity: 4+/5 (pain inhibition)    Assessment/Plan: 1. Functional deficits secondary to new L AKA due to ischemia/gangrene which require 3+ hours per day of interdisciplinary therapy in a comprehensive inpatient rehab setting.  Physiatrist is providing close team supervision and 24 hour management of active medical problems listed below.  Physiatrist and rehab  team continue to assess barriers to discharge/monitor patient progress toward functional and medical goals  Care Tool:  Bathing    Body parts bathed by patient: Right arm, Left arm, Chest, Abdomen, Front perineal area, Right upper leg, Right lower leg, Face, Buttocks   Body parts bathed by helper: Buttocks Body parts n/a: Left lower leg, Left upper leg   Bathing assist Assist Level: Contact Guard/Touching assist     Upper Body Dressing/Undressing Upper body dressing   What is the patient wearing?: Pull over shirt    Upper body assist Assist Level: Independent    Lower Body Dressing/Undressing Lower body dressing      What is the patient wearing?: Underwear/pull up, Pants     Lower body assist Assist for lower body dressing: Contact Guard/Touching assist     Toileting Toileting    Toileting assist Assist for toileting: Contact Guard/Touching assist Assistive Device Comment: bedpan   Transfers Chair/bed transfer  Transfers assist  Chair/bed transfer activity did not occur: Safety/medical concerns  Chair/bed transfer assist level: Contact Guard/Touching assist Chair/bed transfer assistive device: Programmer, multimedia   Ambulation assist   Ambulation activity did not occur: Safety/medical concerns(labial BP and HR sitting EOB.)  Assist level: Contact Guard/Touching assist Assistive device: Walker-rolling Max distance: 25'   Walk 10 feet activity   Assist  Walk 10 feet activity did not occur: Safety/medical concerns(labial BP and HR sitting EOB.)  Assist level: Contact Guard/Touching assist Assistive device: Walker-rolling   Walk 50  feet activity   Assist Walk 50 feet with 2 turns activity did not occur: Safety/medical concerns(labial BP and HR sitting EOB.)         Walk 150 feet activity   Assist Walk 150 feet activity did not occur: Safety/medical concerns(labial BP and HR sitting EOB.)         Walk 10 feet on uneven surface   activity   Assist Walk 10 feet on uneven surfaces activity did not occur: Safety/medical concerns(labial BP and HR sitting EOB.)         Wheelchair     Assist Will patient use wheelchair at discharge?: Yes Type of Wheelchair: Manual Wheelchair activity did not occur: Safety/medical concerns(labial BP and HR sitting EOB.)  Wheelchair assist level: Independent Max wheelchair distance: 150'    Wheelchair 50 feet with 2 turns activity    Assist    Wheelchair 50 feet with 2 turns activity did not occur: Safety/medical concerns(labial BP and HR sitting EOB.)   Assist Level: Independent   Wheelchair 150 feet activity     Assist  Wheelchair 150 feet activity did not occur: Safety/medical concerns(labial BP and HR sitting EOB.)   Assist Level: Supervision/Verbal cueing   Blood pressure 113/68, pulse 66, temperature 97.9 F (36.6 C), temperature source Oral, resp. rate 18, height 5\' 11"  (1.803 m), weight 101.6 kg, SpO2 97 %.  Medical Problem List and Plan: 1.Fxnl and mobility deficitssecondary to PAD causing left AKA  Cont CIR 2.Mechanical AVR/Antithrombotics: -DVT/anticoagulation:Pharmaceutical:Coumadin and heparin GGT INR> 2.5.  Unfractionated heparin within normal range on 9/20   INR 2.4 on 9/20   9/21- INR 2.6- pharmacy stopped Heparin gtt- will con't coumadin mgmt  9/22- INR 2.9  9/23- INR 3.1- per pharmacy  9/24- INR 3.3- per pharmacy  9/27- INR 3.6- per pharmacy  9/28- INR 3.2  9/29- INR 2.7  9/30- INR 2.6  10/1- INR 2.2- will try treatment dose lovenox x 24 hrs  Per pharmacy.   10/2- INR 2.6- stop lovenox  10/3- INR 2.3- restarted treatment dose lovenox  10/4- INR 2.7- will stop Lovenox  -antiplatelet therapy: N/A 3. Pain Management:  Scheduled low dose flexeril tid to help with spasms.  May need neurontin titrate upwarrds. Scheduled tramadol 50 mg TID and will use oxy only when needs it.  Reasonable control  10/3 4. Mood:LCSW to follow for evaluation and support. -antipsychotic agents: N/A 5. Neuropsych: This patientiscapable of making decisions on hisown behalf. 6. Skin/Wound Care:Monitor wound daily. Monitor stump for signs and symptoms of infection  Daily dressings, drainage decreased  9/28- changed ACE wrap to TID per surgeon; ordered retention sock and shrinker for after d/c.  10/2- will con't rentetion sock and ACE wrap til 101/0- they will remove staples 10/10 per Dr Ainsley Spinner office 7. Fluids/Electrolytes/Nutrition:Monitor I/Os 8. T2DM: Monitor BS ac/hs. Continue Lantus 40 mg bid with Victoza  Resumed amaryl 2mg    9/15- increase Lantus to 44 units BID CBG (last 3)  Recent Labs    09/06/19 0632 09/06/19 0651 09/06/19 0713  GLUCAP 52* 54* 104*   Labile on 9/20, monitor for trend  9/21- BGs much better controlled  9/24- running low 200s- will try to increase lantus slightly  9/26- lantus increased to 47u bid   -changed dto CM diet  9/27--add HS snack for am hypoglycemia  9/29- bgS 75-95- actually great control- will monitor  10/1- Bouncing somewhat- will monitor  10/4- will decrease Lantus to 44 units BID 9. CAD/CAF: Monitor for presyncope/syncope as back on  Entresto.   Appreciate cards recs, metoprolol decreased  Dig level low on 9/18  Pulse pressure remains wide on 9/20  Heart rate improving on 9/20  9/21- Cards- decr Metoprolol and Digoxin- pt feeling much better   9/25- Digoxin stopped per Cardiology 10 Chronic systolic CHF: Heart healthy diet and monitor for signs of overload. Check weight daily. On Entresto, Demadex, metoprolol and fenofibrate, Vascepa.    Filed Weights   09/04/19 0500 09/05/19 0500 09/06/19 0545  Weight: 100 kg 101.3 kg 101.6 kg   Weights stable 10/3 11. Urinary retention:   PVRs elevated  Continue Flomax  9/23- voiding "all night" per pt- which was new- didn't require cath  9/26 voiding continently  10/3- cannot void  this AM- will monitor if needs caths again.  10/4- voiding well again-  12. Constipation:Start bowel program as this has been an issues since first surgery.   9/26--bm this morning  10/3- denies issues 13.  Acute blood loss anemia  Transfused 1 unit PRBC on 9/16  Hemoglobin 8.6 on 9/20  Continue to monitor 13. Hyponatremia:   Sodium 135 on 9/17  9/23- Na 133  Continue to monitor 14. OSA: CPAP when sleeping with 2 L bleed in.  15. Acute on chronic renal failure:   Creatinine 1.66 on 9/17   Encourage fluids  9/21- Cr down to 1.4  9/23- Cr up to 1.69 and BUN up to 68- is dry, Cards held QHS torsemide  9/24 Cr 1.62 and BUN down to 57 after Torsemide held  9/29- Cr 1.59 and BUN 48 -holding steady.  9/30- recheck Friday  Continue to monitor 16.  Transaminitis  LFTs elevated on 9/17, labs ordered for tomorrow  9/21- BMP was ordered- will recheck [facility] Thursday  9/24- ALT/AST slightly elevated in 40s  9/30- recheck Friday  Continue to monitor 17. Lethargy/malaise  9/23- U/A (-) for UTI- CBC shows mild leukocytosis; of 11.4k can't just start ABX, unsure as to why- AKA looks OK, per nursing; might be cold/URI (is more nasal); could be dehydration since Cr up and BUN significantly up. Will check labs in AM and monitor more closely.  9/24- resolved   18. Dispo  9/25- will order 15/7 for therapy as of Monday- per OT request.- so pt doesn't do more than 30-45 minutes of therapy max at a time.  9/28- changed to 15/7; Cards agreed shouldn't do CABG, if possible, until gets prosthesis. Will need more assistance- pt aware.  9/29- discussed prosthesis issue with pt- happy to speak with wife if needed- left my office number.  9/30- discussed with wife today- 15 minutes in addition to rounds.  10/1- will call Dr Ainsley Spinner office, per pt request- Dr Carlis Abbott wants to remove staples/look at AKA himself.  10/2- will do 10/10- also has f/u 10/13 with Dr Carlis Abbott in office, on day of d/c.   LOS: 20 days A  FACE TO FACE EVALUATION WAS PERFORMED  Carlen Rebuck 09/06/2019, 3:13 PM

## 2019-09-06 NOTE — Progress Notes (Signed)
ANTICOAGULATION CONSULT NOTE - Follow Up Consult  Pharmacy Consult for warfarin Indication: Mechanical AVR  Patient Measurements: Height: 5\' 11"  (180.3 cm) Weight: 223 lb 15.8 oz (101.6 kg) IBW/kg (Calculated) : 75.3  Vital Signs: Temp: 97.9 F (36.6 C) (10/04 1345) Temp Source: Oral (10/04 1345) BP: 113/68 (10/04 1345) Pulse Rate: 66 (10/04 1345)  Labs: Recent Labs    09/04/19 0641 09/05/19 1014 09/06/19 0552  HGB 9.5*  --   --   HCT 29.7*  --   --   PLT 302  --   --   LABPROT 27.2* 25.1* 28.2*  INR 2.6* 2.3* 2.7*  CREATININE 1.57*  --   --      Assessment: 68 year old male on warfarin for mechanical AVR who received INR reversal (Vitamin K 5mg  IV) on 9/9 for an AKA. Patient was on heparin as a bridge, warfarin was resumed per pharmacy dosing on 9/12. PTA warfarin dose: 10 mg daily except 12.5 mg on Tues/Thurs/Sat. INR today is therapeutic at 2.7.   Goal of Therapy:  INR 2.5-3.5 Monitor platelets by anticoagulation protocol: Yes   Plan:  - Warfarin 10 mg po x1 - Daily INR - Attempting to get patient back to PTA warfarin dose - Discontinue Lovenox    [name] 09/06/2019 4:24 PM

## 2019-09-06 NOTE — Progress Notes (Signed)
Occupational Therapy Session Note  Patient Details  Name: Joseph Hoffman MRN: [id number] Date of Birth: 1950-12-26  Today's Date: 09/06/2019 OT Individual Time: 1300-1333 OT Individual Time Calculation (min): 33 min    Short Term Goals: Week 3:  OT Short Term Goal 1 (Week 3): Pt will be able to bathe LB with S OT Short Term Goal 2 (Week 3): Pt will complete toileting tasks with CGA standing for clothing management OT Short Term Goal 3 (Week 3): Pt will complete toilet transfers with CGA  Skilled Therapeutic Interventions/Progress Updates:    Pt sitting EOB upon arrival. OT intervention with focus on problem solving shower access at home.  Pt has large walk in shower.  Demonstrated use of tub bench with two legs outside shower doorway or just inside shower. Pt's wife had purchased tub seat and pt not happy with selectin.  Pt pleased with tub bench alternative.  Pt attempted lifting RLE 4" off ground and does not have BUE atrength to accomplish.  Pt remained seated EOB with bed alarm activated and all needs within reach.   Therapy Documentation Precautions:  Precautions Precautions: Fall Precaution Comments: monitor HR, sats and BP Restrictions Weight Bearing Restrictions: Yes LLE Weight Bearing: Non weight bearing Other Position/Activity Restrictions: L AKA - in ace wrap.  no drainage seen through wrap    Pain: Pain Assessment Pain Scale: 0-10 Pain Score: 3  Faces Pain Scale: No hurt Pain Type: Chronic pain Pain Location: Hip Pain Orientation: Left Pain Descriptors / Indicators: Aching;Throbbing Pain Frequency: Rarely Pain Onset: Sudden Patients Stated Pain Goal: 2 Pain Intervention(s): repositioned   Therapy/Group: Individual Therapy  [name] 09/06/2019, 1:36 PM

## 2019-09-06 NOTE — Progress Notes (Signed)
Hypoglycemic Event  CBG: 52  Treatment: 8 oz juice/soda  Symptoms: None  Follow-up CBG: Time:7:21 CBG Result:104  Possible Reasons for Event: Unknown  Comments/MD notified: Lovorn    Con Memos

## 2019-09-06 NOTE — Plan of Care (Signed)
  Problem: RH Bed Mobility Goal: LTG Patient will perform bed mobility with assist (PT) Description: LTG: Patient will perform bed mobility with assistance, with/without cues (PT). Flowsheets (Taken 09/06/2019 0841) LTG: Pt will perform bed mobility with assistance level of: (downgraded goal) Supervision/Verbal cueing Note: Downgraded goal due to slow progress for supine to sitting and safety due to decreased balance with bed mobility.    Problem: RH Bed to Chair Transfers Goal: LTG Patient will perform bed/chair transfers w/assist (PT) Description: LTG: Patient will perform bed to chair transfers with assistance (PT). Flowsheets (Taken 09/06/2019 0841) LTG: Pt will perform Bed to Chair Transfers with assistance level: (upgraded goal) Supervision/Verbal cueing Note: Upgraded goal due to patient's progress with functional mobility and balance.    Problem: RH Car Transfers Goal: LTG Patient will perform car transfers with assist (PT) Description: LTG: Patient will perform car transfers with assistance (PT). Flowsheets (Taken 09/06/2019 0841) LTG: Pt will perform car transfers with assist:: (uprgraded goal) Contact Guard/Touching assist Note: Upgraded goal due to patient's progress with functional mobility and balance.    Problem: RH Wheelchair Mobility Goal: LTG Patient will propel w/c in controlled environment (PT) Description: LTG: Patient will propel wheelchair in controlled environment, # of feet with assist (PT) Flowsheets Taken 09/06/2019 0841 LTG: Pt will propel w/c in controlled environ  assist needed:: (upgraded goal) Independent with assistive device Taken 08/18/2019 1227 LTG: Propel w/c distance in controlled environment: 150' Note: Upgraded goal due to patient's progress with UE strength/endurance.  Goal: LTG Patient will propel w/c in home environment (PT) Description: LTG: Patient will propel wheelchair in home environment, # of feet with assistance (PT). Flowsheets Taken  09/06/2019 0841 LTG: Pt will propel w/c in home environ  assist needed:: (upgraded goal) Independent with assistive device Taken 08/18/2019 1227 LTG: Propel w/c distance in home environment: 50' Note: Upgraded goal due to patient's progress with UE strength/endurance.    Problem: RH Ambulation Goal: LTG Patient will ambulate in controlled environment (PT) Description: LTG: Patient will ambulate in a controlled environment, # of feet with assistance (PT). Flowsheets (Taken 09/06/2019 0853) LTG: Pt will ambulate in controlled environ  assist needed:: Contact Guard/Touching assist LTG: Ambulation distance in controlled environment: 25' Goal: LTG Patient will ambulate in home environment (PT) Description: LTG: Patient will ambulate in home environment, # of feet with assistance (PT). Flowsheets (Taken 09/06/2019 0853) LTG: Pt will ambulate in home environ  assist needed:: Contact Guard/Touching assist LTG: Ambulation distance in home environment: 10' with RW to simulate ambulating to bathroom.   Problem: RH Stairs Goal: LTG Patient will ambulate up and down stairs w/assist (PT) Description: LTG: Patient will ambulate up and down # of stairs with assistance (PT) Flowsheets (Taken 09/06/2019 0853) LTG: Pt will ambulate up/down stairs assist needed:: Minimal Assistance - Patient > 75% LTG: Pt will  ambulate up and down number of stairs: 2-3" steps using RW to simulate home entry.

## 2019-09-07 ENCOUNTER — Inpatient Hospital Stay (HOSPITAL_COMMUNITY): Payer: Medicare Other

## 2019-09-07 ENCOUNTER — Inpatient Hospital Stay (HOSPITAL_COMMUNITY): Payer: Medicare Other | Admitting: Occupational Therapy

## 2019-09-07 DIAGNOSIS — N183 Chronic kidney disease, stage 3 unspecified: Secondary | ICD-10-CM

## 2019-09-07 DIAGNOSIS — G8918 Other acute postprocedural pain: Secondary | ICD-10-CM

## 2019-09-07 LAB — BASIC METABOLIC PANEL
Anion gap: 9 (ref 5–15)
BUN: 46 mg/dL — ABNORMAL HIGH (ref 8–23)
CO2: 23 mmol/L (ref 22–32)
Calcium: 9.4 mg/dL (ref 8.9–10.3)
Chloride: 104 mmol/L (ref 98–111)
Creatinine, Ser: 1.7 mg/dL — ABNORMAL HIGH (ref 0.61–1.24)
GFR calc Af Amer: 46 mL/min — ABNORMAL LOW (ref >60)
GFR calc non Af Amer: 40 mL/min — ABNORMAL LOW (ref >60)
Glucose, Bld: 186 mg/dL — ABNORMAL HIGH (ref 70–99)
Potassium: 4.4 mmol/L (ref 3.5–5.1)
Sodium: 136 mmol/L (ref 135–145)

## 2019-09-07 LAB — GLUCOSE, CAPILLARY
Glucose-Capillary: 83 mg/dL (ref 70–99)
Glucose-Capillary: 135 mg/dL — ABNORMAL HIGH (ref 70–99)
Glucose-Capillary: 109 mg/dL — ABNORMAL HIGH (ref 70–99)
Glucose-Capillary: 151 mg/dL — ABNORMAL HIGH (ref 70–99)
Glucose-Capillary: 277 mg/dL — ABNORMAL HIGH (ref 70–99)

## 2019-09-07 LAB — CBC WITH DIFFERENTIAL/PLATELET
Abs Immature Granulocytes: 0.1 10*3/uL — ABNORMAL HIGH (ref 0.00–0.07)
Basophils Absolute: 0.1 10*3/uL (ref 0.0–0.1)
Basophils Relative: 1 %
Eosinophils Absolute: 0.9 10*3/uL — ABNORMAL HIGH (ref 0.0–0.5)
Eosinophils Relative: 9 %
HCT: 28.5 % — ABNORMAL LOW (ref 39.0–52.0)
Hemoglobin: 9.2 g/dL — ABNORMAL LOW (ref 13.0–17.0)
Immature Granulocytes: 1 %
Lymphocytes Relative: 8 %
Lymphs Abs: 0.8 10*3/uL (ref 0.7–4.0)
MCH: 27.1 pg (ref 26.0–34.0)
MCHC: 32.3 g/dL (ref 30.0–36.0)
MCV: 84.1 fL (ref 80.0–100.0)
Monocytes Absolute: 0.9 10*3/uL (ref 0.1–1.0)
Monocytes Relative: 9 %
Neutro Abs: 6.9 10*3/uL (ref 1.7–7.7)
Neutrophils Relative %: 72 %
Platelets: 230 10*3/uL (ref 150–400)
RBC: 3.39 MIL/uL — ABNORMAL LOW (ref 4.22–5.81)
RDW: 17 % — ABNORMAL HIGH (ref 11.5–15.5)
WBC: 9.6 10*3/uL (ref 4.0–10.5)
nRBC: 0 % (ref 0.0–0.2)

## 2019-09-07 LAB — PROTIME-INR
INR: 2.9 — ABNORMAL HIGH (ref 0.8–1.2)
Prothrombin Time: 29.5 seconds — ABNORMAL HIGH (ref 11.4–15.2)

## 2019-09-07 MED ORDER — WARFARIN SODIUM 5 MG PO TABS
10.0000 mg | ORAL_TABLET | Freq: Once | ORAL | Status: AC
  Administered 2019-09-07: 10 mg via ORAL
  Filled 2019-09-07: qty 2

## 2019-09-07 NOTE — Progress Notes (Signed)
Occupational Therapy Session Note  Patient Details  Name: Joseph Hoffman MRN: [id number] Date of Birth: July 29, 1951  Today's Date: 09/07/2019 OT Individual Time: 1345-1430 OT Individual Time Calculation (min): 45 min    Short Term Goals: Week 3:  OT Short Term Goal 1 (Week 3): Pt will be able to bathe LB with S OT Short Term Goal 2 (Week 3): Pt will complete toileting tasks with CGA standing for clothing management OT Short Term Goal 3 (Week 3): Pt will complete toilet transfers with CGA  Skilled Therapeutic Interventions/Progress Updates:    Patient seated edge of bed, ready for therapy.  He did not feel strong enough to stand for SPT with RW, completed sit pivot transfer with CS.  Self propelled w/c room to therapy gym with increased time and fatigue noted.  Completed light UB towel slides on table top, wrist, hand and forearm AROM activities.  Sit pivot back to bed at close of session with CS.  He is consistently pleasant and cooperative but continues to c/o fatigue today.  Bed alarm set and call bell in reach.    Therapy Documentation Precautions:  Precautions Precautions: Fall Precaution Comments: monitor HR, sats and BP Restrictions Weight Bearing Restrictions: Yes LLE Weight Bearing: Non weight bearing Other Position/Activity Restrictions: L AKA - in ace wrap.  no drainage seen through wrap General:   Vital Signs:  Pain: Pain Assessment Pain Scale: 0-10 Pain Score: 0-No pain   Therapy/Group: Individual Therapy  Carlos Levering 09/07/2019, 4:23 PM

## 2019-09-07 NOTE — Progress Notes (Signed)
ANTICOAGULATION CONSULT NOTE - Follow Up Consult  Pharmacy Consult for warfarin Indication: Mechanical AVR  Patient Measurements: Height: 5\' 11"  (180.3 cm) Weight: 220 lb 3.8 oz (99.9 kg) IBW/kg (Calculated) : 75.3  Vital Signs: Temp: 97.5 F (36.4 C) (10/05 0521) Temp Source: Oral (10/05 0521) BP: 119/41 (10/05 0521) Pulse Rate: 59 (10/05 0521)  Labs: Recent Labs    09/05/19 1014 09/06/19 0552 09/07/19 0738  HGB  --   --  9.2*  HCT  --   --  28.5*  PLT  --   --  230  LABPROT 25.1* 28.2* 29.5*  INR 2.3* 2.7* 2.9*  CREATININE  --   --  1.70*     Assessment: 68 year old male on warfarin for mechanical AVR who received INR reversal (Vitamin K 5mg  IV) on 9/9 for an AKA. Patient was on heparin as a bridge, warfarin was resumed per pharmacy dosing on 9/12. PTA warfarin dose: 10 mg daily except 12.5 mg on Tues/Thurs/Sat. INR today is therapeutic at 2.9.  Have been attempting to get patient back on his home dose but has had a few low INR's requiring lovenox. Will see what patients response to 10mg  dose is today but may need to use the 12.5mg  dose more than 3 days per week.   Goal of Therapy:  INR 2.5-3.5 Monitor platelets by anticoagulation protocol: Yes   Plan:  Warfarin 10mg  PO x 1 tonight Daily INR   Karlene Southard, Rande Lawman 09/07/2019 8:43 AM

## 2019-09-07 NOTE — Progress Notes (Signed)
[place] PHYSICAL MEDICINE & REHABILITATION PROGRESS NOTE   Subjective/Complaints: Patient seen sitting up in his chair this AM.  He states he slept well overnight and had a good weekend.  He notes lethargy this AM with DOE.   ROS: Denies CP, SOB at rest, N/V/D  Objective:   No results found. Recent Labs    09/07/19 0738  WBC 9.6  HGB 9.2*  HCT 28.5*  PLT 230   Recent Labs    09/07/19 0738  NA 136  K 4.4  CL 104  CO2 23  GLUCOSE 186*  BUN 46*  CREATININE 1.70*  CALCIUM 9.4    Intake/Output Summary (Last 24 hours) at 09/07/2019 1502 Last data filed at 09/07/2019 1300 Gross per 24 hour  Intake 720 ml  Output 1200 ml  Net -480 ml     Physical Exam: Vital Signs Blood pressure (!) 128/50, pulse 78, temperature 97.8 F (36.6 C), temperature source Oral, resp. rate 18, height 5\' 11"  (1.803 m), weight 99.9 kg, SpO2 98 %. Constitutional: No distress . Vital signs reviewed. HENT: Normocephalic.  Atraumatic. Eyes: EOMI. No discharge. Cardiovascular: No JVD. Irregularly irregular. +Click. Respiratory: Normal effort.  No stridor. GI: Non-distended. Skin: Left AKA with dressing c/d/i Psych: Normal mood.  Normal behavior. Musc: Left AKA with edema and tenderness Neurological: Alert Motor: Bilateral upper extremities: 5/5 proximal distal Right lower extremity: 4/5 proximal distal  Left lower extremity: 4+/5 (pain inhibition)    Assessment/Plan: 1. Functional deficits secondary to new L AKA due to ischemia/gangrene which require 3+ hours per day of interdisciplinary therapy in a comprehensive inpatient rehab setting.  Physiatrist is providing close team supervision and 24 hour management of active medical problems listed below.  Physiatrist and rehab team continue to assess barriers to discharge/monitor patient progress toward functional and medical goals  Care Tool:  Bathing    Body parts bathed by patient: Right arm, Left arm, Chest, Abdomen, Front perineal  area, Right upper leg, Right lower leg, Face, Buttocks, Left upper leg   Body parts bathed by helper: Buttocks, Right lower leg Body parts n/a: Left lower leg   Bathing assist Assist Level: Minimal Assistance - Patient > 75%     Upper Body Dressing/Undressing Upper body dressing   What is the patient wearing?: Pull over shirt    Upper body assist Assist Level: Independent    Lower Body Dressing/Undressing Lower body dressing      What is the patient wearing?: Pants, Underwear/pull up     Lower body assist Assist for lower body dressing: Contact Guard/Touching assist     Toileting Toileting    Toileting assist Assist for toileting: Contact Guard/Touching assist Assistive Device Comment: bedpan   Transfers Chair/bed transfer  Transfers assist  Chair/bed transfer activity did not occur: Safety/medical concerns  Chair/bed transfer assist level: Contact Guard/Touching assist Chair/bed transfer assistive device: Programmer, multimedia   Ambulation assist   Ambulation activity did not occur: Safety/medical concerns(labial BP and HR sitting EOB.)  Assist level: Contact Guard/Touching assist Assistive device: Walker-rolling Max distance: 25'   Walk 10 feet activity   Assist  Walk 10 feet activity did not occur: Safety/medical concerns(labial BP and HR sitting EOB.)  Assist level: Contact Guard/Touching assist Assistive device: Walker-rolling   Walk 50 feet activity   Assist Walk 50 feet with 2 turns activity did not occur: Safety/medical concerns(labial BP and HR sitting EOB.)         Walk 150 feet activity   Assist  Walk 150 feet activity did not occur: Safety/medical concerns(labial BP and HR sitting EOB.)         Walk 10 feet on uneven surface  activity   Assist Walk 10 feet on uneven surfaces activity did not occur: Safety/medical concerns(labial BP and HR sitting EOB.)         Wheelchair     Assist Will patient use  wheelchair at discharge?: Yes Type of Wheelchair: Manual Wheelchair activity did not occur: Safety/medical concerns(labial BP and HR sitting EOB.)  Wheelchair assist level: Independent Max wheelchair distance: 150'    Wheelchair 50 feet with 2 turns activity    Assist    Wheelchair 50 feet with 2 turns activity did not occur: Safety/medical concerns(labial BP and HR sitting EOB.)   Assist Level: Independent   Wheelchair 150 feet activity     Assist  Wheelchair 150 feet activity did not occur: Safety/medical concerns(labial BP and HR sitting EOB.)   Assist Level: Supervision/Verbal cueing   Blood pressure (!) 128/50, pulse 78, temperature 97.8 F (36.6 C), temperature source Oral, resp. rate 18, height 5\' 11"  (1.803 m), weight 99.9 kg, SpO2 98 %.  Medical Problem List and Plan: 1.Fxnl and mobility deficitssecondary to PAD causing left AKA  Cont CIR 2.Mechanical AVR/Antithrombotics: -DVT/anticoagulation:Pharmaceutical:Coumadin    INR therapeutic on 10/5 -antiplatelet therapy: N/A 3. Pain Management:  Scheduled low dose flexeril tid to help with spasms.  May need neurontin titrate upwarrds. Scheduled tramadol 50 mg TID and will use oxy only when needs it.  Relatively controlled on 10/5 4. Mood:LCSW to follow for evaluation and support. -antipsychotic agents: N/A 5. Neuropsych: This patientiscapable of making decisions on hisown behalf. 6. Skin/Wound Care:Monitor wound daily. Monitor stump for signs and symptoms of infection  Daily dressings, drainage decreased  9/28- changed ACE wrap to TID per surgeon; ordered retention sock and shrinker for after d/c.  10/2- will con't rentetion sock and ACE wrap until 101/0- they will remove staples 10/10 per Dr Ainsley Spinner office 7. Fluids/Electrolytes/Nutrition:Monitor I/Os 8. T2DM: Monitor BS ac/hs. Continue Lantus 40 mg bid with Victoza  Resumed amaryl 2mg    CBG (last 3)  Recent Labs    09/06/19 2104 09/07/19 0623 09/07/19 1121  GLUCAP 109* 151* 277*   9/26- lantus increased to 47u bid, decreased to 44 on 10/4   -changed dto CM diet  Labile on 10/5 9. CAD/CAF: Monitor for presyncope/syncope as back on Entresto.   9/21- Cards- decr Metoprolol and Digoxin- pt feeling much better   9/25- Digoxin stopped per Cardiology  Wide pulse pressure 10 Chronic systolic CHF: Heart healthy diet and monitor for signs of overload. Check weight daily. On Entresto, Demadex, metoprolol and fenofibrate, Vascepa.    Filed Weights   09/05/19 0500 09/06/19 0545 09/07/19 0521  Weight: 101.3 kg 101.6 kg 99.9 kg   Weights stable 10/5 11. Urinary retention:   PVRs elevated  Continue Flomax  9/23- voiding "all night" per pt- which was new- didn't require cath  9/26 voiding continently  10/3- cannot void this AM- will monitor if needs caths again.  10/4- voiding well again-  12. Constipation:Start bowel program as this has been an issues since first surgery.   9/26--bm this morning  10/3- denies issues 13.  Acute blood loss anemia  Transfused 1 unit PRBC on 9/16  Hemoglobin 9.2 on 10/5  Continue to monitor 13. Hyponatremia:   Sodium 136 on 10/5  Continue to monitor 14. OSA: CPAP when sleeping with 2 L bleed in.  15.  Acute on chronic renal failure:   Creatinine 1.70 on 10/5  Encourage fluids  Continue to monitor 16.  Transaminitis: Resolved  Continue to monitor 17. Lethargy/malaise  Improving, ?cardiac source  LOS: 21 days A FACE TO FACE EVALUATION WAS PERFORMED  Joseph Hoffman Lorie Phenix 09/07/2019, 3:02 PM

## 2019-09-07 NOTE — Progress Notes (Addendum)
Physical Therapy Weekly Progress Note  Patient Details  Name: Joseph Hoffman MRN: [id number] Date of Birth: 20-Aug-1951  Beginning of progress report period: September 01, 2019 End of progress report period: September 07, 2019  Today's Date: 09/07/2019 PT Individual Time: 4496-7591 and 1510-1540 PT Individual Time Calculation (min): 45 min and 30 min   Patient has met 3 of 5 short term goals.  He is progressing well with therapy this week. He currently requires min A-supervision for bed mobility, variable based on fatigue, CGA for transfer with or without the slide board or with the RW, CGA to ambulate up to 25 feet, and mod I with w/c mobility. He has initiated limb wrapping and HEP this week, requiring min cues to complete both at this time. Patient is on track to meet goals by d/c on Oct. 13, 2020. Will continue to benefit from skilled intensive PT to address deficits in balance, functional mobility, strength, ROM, amputee education, and patient/family education in preparation for d/c next week to reduce caregiver burden and increased independence.   Patient continues to demonstrate the following deficits muscle weakness and muscle joint tightness, decreased cardiorespiratoy endurance and decreased sitting balance, decreased standing balance, decreased postural control and decreased balance strategies and therefore will continue to benefit from skilled PT intervention to increase functional independence with mobility.  Patient progressing toward long term goals..  Plan of care revisions: down graded bed mobility goal to supervision due to inconsistency in performance with mobilty, upgraded transfers and standing balance to supervision level, and added gait and stair goals due to patient's progress with ambulation. .  PT Short Term Goals Week 3:  PT Short Term Goal 1 (Week 3): Patient will perform bed mobility with supervision. PT Short Term Goal 1 - Progress (Week 3): Progressing toward goal PT  Short Term Goal 2 (Week 3): Patient will be independent with HEP. PT Short Term Goal 2 - Progress (Week 3): Progressing toward goal PT Short Term Goal 3 (Week 3): Patient will perform level transfers with CGA. PT Short Term Goal 3 - Progress (Week 3): Met PT Short Term Goal 4 (Week 3): Patient will perform unlevel transfers with min A. PT Short Term Goal 4 - Progress (Week 3): Met PT Short Term Goal 5 (Week 3): Patient will ambulate 15 feet with CGA using LRAD. PT Short Term Goal 5 - Progress (Week 3): Met Week 4:  PT Short Term Goal 1 (Week 4): STG=LTG due to ELOS.  Skilled Therapeutic Interventions/Progress Updates:     Session 1: Patient in bed upon PT arrival. Patient alert and agreeable to PT session. Patient denied pain throughout session.  Therapeutic Activity: Bed Mobility: Patient performed supine to/from sit with min A-supervsion. Provided verbal cues for rolling to the R and pushing up with his elbow to sit up, patient reported increased UE soreness this morning requiring min A to sit EOB. Transfers: Patient performed sit to/from stand x1 and stand pivot x1 using a RW with CGA for safety/balance. Patient was able to teach-back hand placement on RW and leaning forward to stand without cues from therapist. Patient donned boxers and shorts, threading through B LEs in sitting and pulling up in standing with CGA for balance. Patient donned his R shoe using a shoe horn with supervision sitting in the w/c.   Gait Training:  Patient ambulated 4 feet to the 3" steps using RW with CGA. Ambulated with hop-to gait pattern on R with decreased R foot clearance and heavy use of  B UEs. Provided verbal cues for shoulder depression and use of UEs to lift his foot rather than hopping to improve foot clearance and reduce impact on R foot. Patient attempted going up a 3" step backwards using a RW, however was unable to clear his R foot to go up the step and reported increased fatigue with mild chest  tightness after 1 attempt. Assisted patient back to his w/c and transported patient with total A back to his room. Vitals: BP 126/55, HR 79-88 (regular rhythm), SPO2 99%. Patient reported that he had not yet received his morning medications and though that could be causing his fatigue this morning. PT educated on general physiology of CHF and benefits of taking medications prior to therapies. Discussed pushing patient's therapies back to 0900 to make sure morning medication were administered before therapies. Patient in agreement and scheduling notified.   Wheelchair Mobility:  Patient propelled wheelchair 100 feet with mod I limited by increased fatigue and UE soreness this morning. Provided verbal cues for monitoring SOB with activity and taking rest breaks when needed for energy conservation.   Patient in bed at end of session with breaks locked, bed alarm set, and all needs within reach. RN providing morning medications and notified of patient's symptoms at end of session.   Session 2: Patient sitting EOB with his wife in the room upon PT arrival. Patient alert and agreeable to PT session. Patient and his wife participated in education for home modification, fall risk and safety prevention throughout session. Discussed patient's attempt at the stairs during morning session with patient and his wife and recommended that they put in a ramp for easier access to the home and discussed the possibility of patient being independent with entering/exiting the home at w/c level. Discussed the fluctuation in energy levels associated with CHF and the increased workload on the hear for ambulation as an amputee and recommended strongly that a ramp would be in the patient's best interest for safety. Patient and wife in agreement and PT provided resources for building a ramp during session. Also discussed staple removal from the patient's incision site, reported that PT will follow up with Dr. Dagoberto Ligas, MD tomorrow, but  from chart it sounds like they will be out this week. Discussed retention sock with Joseph Hoffman from [employer] during session, decided to wait on modifying the retention sock to fit AKA since shrinkers should be ordered when staples come out this week.  Informed patient's wife of patient's progress and current functional level. Discussed wife's upcoming surgeries and inquired about additional assistance available after their son leaves 1-2 weeks following d/c. Patient's wife plans to hire a CNA or aid to help out, PT provided a list or resources for acquiring these services.   Patient sitting EOB at end of session with breaks locked, bed alarm set, and all needs within reach.    Therapy Documentation Precautions:  Precautions Precautions: Fall Precaution Comments: monitor HR, sats and BP Restrictions Weight Bearing Restrictions: Yes LLE Weight Bearing: Non weight bearing Other Position/Activity Restrictions: L AKA - in ace wrap.  no drainage seen through wrap   Therapy/Group: Individual Therapy  Towanda Hornstein L Celica Kotowski PT, DPT  09/07/2019, 3:53 PM

## 2019-09-07 NOTE — Progress Notes (Signed)
Occupational Therapy Session Note  Patient Details  Name: Joseph Hoffman MRN: [id number] Date of Birth: Feb 20, 1951  Today's Date: 09/07/2019 OT Individual Time: 1130-1200 OT Individual Time Calculation (min): 30 min    Short Term Goals: Week 3:  OT Short Term Goal 1 (Week 3): Pt will be able to bathe LB with S OT Short Term Goal 2 (Week 3): Pt will complete toileting tasks with CGA standing for clothing management OT Short Term Goal 3 (Week 3): Pt will complete toilet transfers with CGA  Skilled Therapeutic Interventions/Progress Updates:    Patient seated in w/c completing adl tasks upon arrival.  He denies pain but states that his chest felt heavy this morning - it has resolved since breathing treatment but he continues to feel tired today.  Assisted with bathing right LE and bottom today due to fatigue.  UB dressing and grooming tasks independent w/c level.  LB dressing CG in stance for clothing management only.  Right sock and shoe mod A due to fatigue.  Sit to stand with RW CS x2 during adl tasks.  Reviewed home set up, bathroom set up/DME and ramping options in garage.  He demonstrates a good understanding.  He remained seated in w/c at close of session with tray table and call bell in reach.    Therapy Documentation Precautions:  Precautions Precautions: Fall Precaution Comments: monitor HR, sats and BP Restrictions Weight Bearing Restrictions: Yes LLE Weight Bearing: Non weight bearing Other Position/Activity Restrictions: L AKA - in ace wrap.  no drainage seen through wrap General:   Vital Signs: Therapy Vitals Temp: 97.8 F (36.6 C) Pulse Rate: 78 Resp: 18 BP: (!) 128/50 Patient Position (if appropriate): Sitting Oxygen Therapy SpO2: 98 % O2 Device: Room Air Pain: Pain Assessment Pain Scale: 0-10 Pain Score: 0-No pain   Therapy/Group: Individual Therapy  Carlos Levering 09/07/2019, 12:40 PM

## 2019-09-07 NOTE — Progress Notes (Signed)
Social Work Patient ID: Joseph Hoffman, male   DOB: [birth year], 68 y.o.   MRN: [id number]    Diagnosis codes:S78.112A, i50.41 & I25.10  Height: 5'11               Weight:  220 lbs          Patient suffers from [place]   which impairs his ability to perform daily activities like ADL's and tolieting   in the home.  A walker  will not resolve issue with performing activities of daily living.  A wheelchair will allow patient to safely perform daily activities.  Patient is not able to propel themselves in the home using a standard weight wheelchair due to endurance and fatigue .  Patient can self propel in the lightweight wheelchair.

## 2019-09-08 ENCOUNTER — Inpatient Hospital Stay (HOSPITAL_COMMUNITY): Payer: Medicare Other

## 2019-09-08 ENCOUNTER — Encounter: Payer: Medicare Other | Admitting: Vascular Surgery

## 2019-09-08 LAB — GLUCOSE, CAPILLARY
Glucose-Capillary: 147 mg/dL — ABNORMAL HIGH (ref 70–99)
Glucose-Capillary: 88 mg/dL (ref 70–99)
Glucose-Capillary: 62 mg/dL — ABNORMAL LOW (ref 70–99)
Glucose-Capillary: 94 mg/dL (ref 70–99)
Glucose-Capillary: 145 mg/dL — ABNORMAL HIGH (ref 70–99)
Glucose-Capillary: 118 mg/dL — ABNORMAL HIGH (ref 70–99)
Glucose-Capillary: 132 mg/dL — ABNORMAL HIGH (ref 70–99)

## 2019-09-08 LAB — PROTIME-INR
INR: 2.8 — ABNORMAL HIGH (ref 0.8–1.2)
Prothrombin Time: 28.8 seconds — ABNORMAL HIGH (ref 11.4–15.2)

## 2019-09-08 MED ORDER — INSULIN GLARGINE 100 UNIT/ML ~~LOC~~ SOLN
40.0000 [IU] | Freq: Two times a day (BID) | SUBCUTANEOUS | Status: DC
  Administered 2019-09-08 – 2019-09-14 (×13): 40 [IU] via SUBCUTANEOUS
  Filled 2019-09-08 (×13): qty 0.4

## 2019-09-08 MED ORDER — CALCIUM POLYCARBOPHIL 625 MG PO TABS
625.0000 mg | ORAL_TABLET | Freq: Once | ORAL | Status: AC
  Administered 2019-09-08: 625 mg via ORAL
  Filled 2019-09-08: qty 1

## 2019-09-08 MED ORDER — WARFARIN SODIUM 7.5 MG PO TABS
12.5000 mg | ORAL_TABLET | Freq: Once | ORAL | Status: AC
  Administered 2019-09-08: 18:00:00 12.5 mg via ORAL
  Filled 2019-09-08: qty 1

## 2019-09-08 NOTE — Progress Notes (Addendum)
Physical Therapy Session Note  Patient Details  Name: Joseph Hoffman MRN: [id number] Date of Birth: 27-May-1951  Today's Date: 09/08/2019 PT Individual Time: 0845-0930 PT Individual Time Calculation (min): 45 min   Short Term Goals: Week 4:  PT Short Term Goal 1 (Week 4): STG=LTG due to ELOS.  Skilled Therapeutic Interventions/Progress Updates:     Patient in w/c at the sink finishing washing up this morning upon PT arrival. Patient alert and agreeable to PT session. Requested PT to wash his R foot at beginning of session. PT washed his R foot with total A and provided education on well limb care to prevent and check for new wounds, cracking of the skin, and changes in color. RN present at beginning of session to provide morning medications. MD rounded at beginning of session and inspected L residual limb and incision site and provided reenforcement for wrapping schedule with patient and RN during session.   Therapeutic Activity: Transfers: Patient performed sit to/from stand x2 with supervision and standing balance x2 min for pulling up pants with CGA-close supervision and x3 min for balance training with 1-2 UE support with close supervision. Provided verbal cues for glute activation to assist with standing balance and erect posture.  Patient donned shorts, boxers, a shirt, and a shoe in sitting independently, except standing to pull up shorts as described above. PT donned his sock with total A for time management. He also wrapped his residual limb with 2-4" ACE wraps with min cues during session. Discussed patient performing morning bathing, dressing, and ACE wrapping prior to first therapy session in the morning with assist from nursing to perform transfers and standing for dressing. Patient in agreement.   Wheelchair Mobility:  Patient propelled wheelchair 155 feet with mod I, limited by increased fatigue and mild SOB, recovered in <1 min during sitting rest break.  He went up a ramp  backwards and down a ramp forwards in the w/c using B UEs and his R foot with CGA x1 and supervision x1. Provided cues for technique and leaning back in the chair when descending the ramp to prevent tipping in the chair.   Patient in w/c at end of session with breaks locked, bed alarm set, and all needs within reach.    Therapy Documentation Precautions:  Precautions Precautions: Fall Precaution Comments: monitor HR, sats and BP Restrictions Weight Bearing Restrictions: Yes LLE Weight Bearing: Non weight bearing Other Position/Activity Restrictions: L AKA - in ace wrap.  no drainage seen through wrap    Therapy/Group: Individual Therapy  [name] PT, DPT  09/08/2019, 12:35 PM

## 2019-09-08 NOTE — Progress Notes (Signed)
Occupational Therapy Session Note  Patient Details  Name: Joseph Hoffman MRN: [id number] Date of Birth: 04/10/1951  Today's Date: 09/08/2019 OT Individual Time: 1045-1130 OT Individual Time Calculation (min): 45 min    Short Term Goals: Week 3:  OT Short Term Goal 1 (Week 3): Pt will be able to bathe LB with S OT Short Term Goal 2 (Week 3): Pt will complete toileting tasks with CGA standing for clothing management OT Short Term Goal 3 (Week 3): Pt will complete toilet transfers with CGA  Skilled Therapeutic Interventions/Progress Updates:    OT intervention with focus on ongoing discharge planning, continued HH and OP therapy. Informed pt that CSW will make initial appointments and referrals.  Pt requested use of toilet and completed all tasks and transfers with CGA. OT intervention with focus on sit<>stand and standing balance to increase independence with LB dressing and clothing management tasks.  Supervision for static standing balance and CGA for dynamic standing balance.  Pt returned to bed and remained seated EOB with bed alarm activated and all needs within reach.   Therapy Documentation Precautions:  Precautions Precautions: Fall Precaution Comments: monitor HR, sats and BP Restrictions Weight Bearing Restrictions: Yes LLE Weight Bearing: Non weight bearing Other Position/Activity Restrictions: L AKA - in ace wrap.  no drainage seen through wrap   Pain:  Pt denies pain this morning  Therapy/Group: Individual Therapy  [name] 09/08/2019, 12:59 PM

## 2019-09-08 NOTE — Plan of Care (Signed)
  Problem: Consults Goal: RH LIMB LOSS PATIENT EDUCATION Description: Description: See Patient Education module for eduction specifics. Outcome: Progressing Goal: Skin Care Protocol Initiated - if Braden Score 18 or less Description: If consults are not indicated, leave blank or document N/A Outcome: Progressing   Problem: RH BOWEL ELIMINATION Goal: RH STG MANAGE BOWEL WITH ASSISTANCE Description: STG Manage Bowel with  mod I Assistance. Outcome: Progressing Goal: RH STG MANAGE BOWEL W/MEDICATION W/ASSISTANCE Description: STG Manage Bowel with Medication with mod I Assistance. Outcome: Progressing   Problem: RH BLADDER ELIMINATION Goal: RH STG MANAGE BLADDER WITH ASSISTANCE Description: STG Manage Bladder With min Assistance Outcome: Progressing Goal: RH STG MANAGE BLADDER WITH MEDICATION WITH ASSISTANCE Description: STG Manage Bladder With Medication With  mod I Assistance. Outcome: Progressing   Problem: RH SKIN INTEGRITY Goal: RH STG SKIN FREE OF INFECTION/BREAKDOWN Description: With min assist Outcome: Progressing Goal: RH STG MAINTAIN SKIN INTEGRITY WITH ASSISTANCE Description: STG Maintain Skin Integrity With  min Assistance. Outcome: Progressing Goal: RH STG ABLE TO PERFORM INCISION/WOUND CARE W/ASSISTANCE Description: STG Able To Perform Incision/Wound Care With min Assistance. Outcome: Progressing   Problem: RH SAFETY Goal: RH STG ADHERE TO SAFETY PRECAUTIONS W/ASSISTANCE/DEVICE Description: STG Adhere to Safety Precautions With cues/reminders Assistance/Device. Outcome: Progressing   Problem: RH PAIN MANAGEMENT Goal: RH STG PAIN MANAGED AT OR BELOW PT'S PAIN GOAL Outcome: Progressing   Problem: RH KNOWLEDGE DEFICIT LIMB LOSS Goal: RH STG INCREASE KNOWLEDGE OF SELF CARE AFTER LIMB LOSS Outcome: Progressing

## 2019-09-08 NOTE — Progress Notes (Signed)
[place] PHYSICAL MEDICINE & REHABILITATION PROGRESS NOTE   Subjective/Complaints:  Pt reports eating "like a pig" so not the cause of low BGs. Had a bad day yesterday with fatigue, pain, etc.  Had a BG of 62 this AM. We reduced Lantus to 40 units BID.  ROS: Denies CP, SOB at rest, N/V/D  Objective:   No results found. Recent Labs    09/07/19 0738  WBC 9.6  HGB 9.2*  HCT 28.5*  PLT 230   Recent Labs    09/07/19 0738  NA 136  K 4.4  CL 104  CO2 23  GLUCOSE 186*  BUN 46*  CREATININE 1.70*  CALCIUM 9.4    Intake/Output Summary (Last 24 hours) at 09/08/2019 1419 Last data filed at 09/08/2019 0730 Gross per 24 hour  Intake 1076 ml  Output -  Net 1076 ml     Physical Exam: Vital Signs Blood pressure (!) 106/51, pulse 75, temperature 97.8 F (36.6 C), resp. rate 18, height 5\' 11"  (1.803 m), weight 99.9 kg, SpO2 99 %. Constitutional: No distress . Vital signs and labs reviewed.  HENT: Normocephalic.  Atraumatic. Eyes: EOMI. Conjugate gaze Cardiovascular:  Irregularly irregular. +Click. Respiratory: Normal effort.  No stridor. GI: Non-distended. Skin: Left AKA with poorly wrapped ACE (hasn't been rewrapped since yesterday AM) wrap- little drainage midline- mainly serous- improving edema; less Tenderness; no erythema; staples intact.  Psych: Normal mood.  Normal behavior. Musc: Left AKA with edema and tenderness Neurological: Alert Motor: Bilateral upper extremities: 5/5 proximal distal Right lower extremity: 4/5 proximal distal  Left lower extremity: 4+/5 (pain inhibition)    Assessment/Plan: 1. Functional deficits secondary to new L AKA due to ischemia/gangrene which require 3+ hours per day of interdisciplinary therapy in a comprehensive inpatient rehab setting.  Physiatrist is providing close team supervision and 24 hour management of active medical problems listed below.  Physiatrist and rehab team continue to assess barriers to discharge/monitor  patient progress toward functional and medical goals  Care Tool:  Bathing    Body parts bathed by patient: Right arm, Left arm, Chest, Abdomen, Front perineal area, Right upper leg, Right lower leg, Face, Buttocks, Left upper leg   Body parts bathed by helper: Buttocks, Right lower leg Body parts n/a: Left lower leg   Bathing assist Assist Level: Minimal Assistance - Patient > 75%     Upper Body Dressing/Undressing Upper body dressing   What is the patient wearing?: Pull over shirt    Upper body assist Assist Level: Independent    Lower Body Dressing/Undressing Lower body dressing      What is the patient wearing?: Pants, Underwear/pull up     Lower body assist Assist for lower body dressing: Contact Guard/Touching assist     Toileting Toileting    Toileting assist Assist for toileting: Contact Guard/Touching assist Assistive Device Comment: bedpan   Transfers Chair/bed transfer  Transfers assist  Chair/bed transfer activity did not occur: Safety/medical concerns  Chair/bed transfer assist level: Contact Guard/Touching assist Chair/bed transfer assistive device: Programmer, multimedia   Ambulation assist   Ambulation activity did not occur: Safety/medical concerns(labial BP and HR sitting EOB.)  Assist level: Contact Guard/Touching assist Assistive device: Walker-rolling Max distance: 4'   Walk 10 feet activity   Assist  Walk 10 feet activity did not occur: Safety/medical concerns(labial BP and HR sitting EOB.)  Assist level: Contact Guard/Touching assist Assistive device: Walker-rolling   Walk 50 feet activity   Assist Walk 50 feet with  2 turns activity did not occur: Safety/medical concerns(labial BP and HR sitting EOB.)         Walk 150 feet activity   Assist Walk 150 feet activity did not occur: Safety/medical concerns(labial BP and HR sitting EOB.)         Walk 10 feet on uneven surface  activity   Assist Walk 10  feet on uneven surfaces activity did not occur: Safety/medical concerns(labial BP and HR sitting EOB.)         Wheelchair     Assist Will patient use wheelchair at discharge?: Yes Type of Wheelchair: Manual Wheelchair activity did not occur: Safety/medical concerns(labial BP and HR sitting EOB.)  Wheelchair assist level: Independent Max wheelchair distance: 33'    Wheelchair 50 feet with 2 turns activity    Assist    Wheelchair 50 feet with 2 turns activity did not occur: Safety/medical concerns(labial BP and HR sitting EOB.)   Assist Level: Independent   Wheelchair 150 feet activity     Assist  Wheelchair 150 feet activity did not occur: Safety/medical concerns(labial BP and HR sitting EOB.)   Assist Level: Independent   Blood pressure (!) 106/51, pulse 75, temperature 97.8 F (36.6 C), resp. rate 18, height 5\' 11"  (1.803 m), weight 99.9 kg, SpO2 99 %.  Medical Problem List and Plan: 1.Fxnl and mobility deficitssecondary to PAD causing left AKA  Cont CIR 2.Mechanical AVR/Antithrombotics: -DVT/anticoagulation:Pharmaceutical:Coumadin    INR therapeutic on 10/5 -antiplatelet therapy: N/A 3. Pain Management:  Scheduled low dose flexeril tid to help with spasms.  May need neurontin titrate upwarrds. Scheduled tramadol 50 mg TID and will use oxy only when needs it.  Relatively controlled on 10/5 4. Mood:LCSW to follow for evaluation and support. -antipsychotic agents: N/A 5. Neuropsych: This patientiscapable of making decisions on hisown behalf. 6. Skin/Wound Care:Monitor wound daily. Monitor stump for signs and symptoms of infection  Daily dressings, drainage decreased  9/28- changed ACE wrap to TID per surgeon; ordered retention sock and shrinker for after d/c.  10/2- will con't retention sock and ACE wrap until 101/0- they will remove staples 10/10 per Dr Ainsley Spinner office  10/6- mild  drainage- mainly serous- reeducated nursing to rewrap ACE 3-4x/day for edema control 7. Fluids/Electrolytes/Nutrition:Monitor I/Os 8. T2DM: Monitor BS ac/hs. Continue Lantus 40 mg bid with Victoza  Resumed amaryl 2mg   CBG (last 3)  Recent Labs    09/08/19 0629 09/08/19 0650 09/08/19 1130  GLUCAP 62* 94 145*   9/26- lantus increased to 47u bid, decreased to 44 on 10/4   -changed dto CM diet  10/6- reduced Lantus to 40 units BID again- pt said eating well- isn't cause of low BGs. 9. CAD/CAF: Monitor for presyncope/syncope as back on Entresto.   9/21- Cards- decr Metoprolol and Digoxin- pt feeling much better   9/25- Digoxin stopped per Cardiology  Wide pulse pressure 10 Chronic systolic CHF: Heart healthy diet and monitor for signs of overload. Check weight daily. On Entresto, Demadex, metoprolol and fenofibrate, Vascepa.    Filed Weights   09/05/19 0500 09/06/19 0545 09/07/19 0521  Weight: 101.3 kg 101.6 kg 99.9 kg   Weights stable 10/5 11. Urinary retention:   PVRs elevated  Continue Flomax  9/23- voiding "all night" per pt- which was new- didn't require cath  9/26 voiding continently  10/3- cannot void this AM- will monitor if needs caths again.  10/4- voiding well again-  12. Constipation:Start bowel program as this has been an issues since first surgery.  9/26--bm this morning  10/3- denies issues 13.  Acute blood loss anemia  Transfused 1 unit PRBC on 9/16  Hemoglobin 9.2 on 10/5  Continue to monitor 13. Hyponatremia:   Sodium 136 on 10/5  Continue to monitor 14. OSA: CPAP when sleeping with 2 L bleed in.  15. Acute on chronic renal failure:   Creatinine 1.70 on 10/5  Encourage fluids  Continue to monitor 16.  Transaminitis: Resolved  Continue to monitor 17. Lethargy/malaise  Improving, ?cardiac source  LOS: 22 days A FACE TO FACE EVALUATION WAS PERFORMED  Joseph Hoffman 09/08/2019, 2:19 PM

## 2019-09-08 NOTE — Progress Notes (Signed)
ANTICOAGULATION CONSULT NOTE - Follow Up Consult  Pharmacy Consult for warfarin Indication: Mechanical AVR  Patient Measurements: Height: 5\' 11"  (180.3 cm) Weight: 220 lb 3.8 oz (99.9 kg) IBW/kg (Calculated) : 75.3  Vital Signs: Temp: 98.6 F (37 C) (10/06 0617) Temp Source: Oral (10/06 0617) BP: 110/72 (10/06 0617) Pulse Rate: 85 (10/06 0617)  Labs: Recent Labs    09/06/19 0552 09/07/19 0738 09/08/19 0507  HGB  --  9.2*  --   HCT  --  28.5*  --   PLT  --  230  --   LABPROT 28.2* 29.5* 28.8*  INR 2.7* 2.9* 2.8*  CREATININE  --  1.70*  --      Assessment: 68 year old male on warfarin for mechanical AVR who received INR reversal (Vitamin K 5mg  IV) on 9/9 for an AKA. Patient was on heparin as a bridge, warfarin was resumed per pharmacy dosing on 9/12. PTA warfarin dose: 10 mg daily except 12.5 mg on Tues/Thurs/Sat. INR today is therapeutic at 2.8. Has intermittently required Lovenox for subtherapeutic INR - due to patient inadvertently missing a dose on 09/01/19.   Goal of Therapy:  INR 2.5-3.5 Monitor platelets by anticoagulation protocol: Yes   Plan:  - Warfarin 12.5 mg po x1 - Daily INR   [name] 09/08/2019 10:06 AM

## 2019-09-08 NOTE — Progress Notes (Signed)
Occupational Therapy Session Note  Patient Details  Name: Joseph Hoffman MRN: [id number] Date of Birth: May 29, 1951  Today's Date: 09/08/2019 OT Individual Time: 1300-1345 OT Individual Time Calculation (min): 45 min    Short Term Goals: Week 3:  OT Short Term Goal 1 (Week 3): Pt will be able to bathe LB with S OT Short Term Goal 2 (Week 3): Pt will complete toileting tasks with CGA standing for clothing management OT Short Term Goal 3 (Week 3): Pt will complete toilet transfers with CGA  Skilled Therapeutic Interventions/Progress Updates:    Pt transferring to toilet with RN upon arrival.  Pt completed all toileting tasks and transfers with CGA.  Pt transitioned to ADL apartment and practiced transfer to recliner.  Pt transferred to recliner with CGA but required max A for sit>stand from recliner.  Pt remarked that he won't be able to sit in his recliner at home unless he can raise seat.  Pt returned to room and transferred to EOB.  Pt remained seated EOB with bed alarm acitvated and all needs within reach.   Therapy Documentation Precautions:  Precautions Precautions: Fall Precaution Comments: monitor HR, sats and BP Restrictions Weight Bearing Restrictions: Yes LLE Weight Bearing: Non weight bearing Other Position/Activity Restrictions: L AKA - in ace wrap.  no drainage seen through wrap   Pain:  Pt denies pain this afternoon   Therapy/Group: Individual Therapy  [name] 09/08/2019, 1:48 PM

## 2019-09-09 ENCOUNTER — Inpatient Hospital Stay (HOSPITAL_COMMUNITY): Payer: Medicare Other

## 2019-09-09 LAB — PROTIME-INR
INR: 2.9 — ABNORMAL HIGH (ref 0.8–1.2)
Prothrombin Time: 29.5 seconds — ABNORMAL HIGH (ref 11.4–15.2)

## 2019-09-09 LAB — GLUCOSE, CAPILLARY
Glucose-Capillary: 80 mg/dL (ref 70–99)
Glucose-Capillary: 189 mg/dL — ABNORMAL HIGH (ref 70–99)
Glucose-Capillary: 186 mg/dL — ABNORMAL HIGH (ref 70–99)
Glucose-Capillary: 303 mg/dL — ABNORMAL HIGH (ref 70–99)

## 2019-09-09 MED ORDER — WARFARIN SODIUM 7.5 MG PO TABS: 12.5000 mg | ORAL_TABLET | ORAL | Status: DC

## 2019-09-09 MED ORDER — WARFARIN SODIUM 5 MG PO TABS
10.0000 mg | ORAL_TABLET | ORAL | Status: AC
  Administered 2019-09-09: 10 mg via ORAL
  Filled 2019-09-09 (×2): qty 2

## 2019-09-09 MED ORDER — INSULIN ASPART 100 UNIT/ML ~~LOC~~ SOLN
0.0000 [IU] | Freq: Three times a day (TID) | SUBCUTANEOUS | Status: DC
  Administered 2019-09-09: 3 [IU] via SUBCUTANEOUS
  Administered 2019-09-14 – 2019-09-15 (×3): 1 [IU] via SUBCUTANEOUS

## 2019-09-09 MED ORDER — INSULIN ASPART 100 UNIT/ML ~~LOC~~ SOLN
0.0000 [IU] | Freq: Every day | SUBCUTANEOUS | Status: DC
  Administered 2019-09-09: 4 [IU] via SUBCUTANEOUS

## 2019-09-09 NOTE — Progress Notes (Signed)
Physical Therapy Session Note  Patient Details  Name: Joseph Hoffman MRN: [id number] Date of Birth: [birth year]  Today's Date: 09/09/2019 PT Individual Time: 1430-1530 PT Individual Time Calculation (min): 60 min   Short Term Goals: Week 4:  PT Short Term Goal 1 (Week 4): STG=LTG due to ELOS.  Skilled Therapeutic Interventions/Progress Updates:     Patient in bed upon PT arrival. Patient alert and agreeable to PT session. Patient denied pain throughout session, except as noted below.   Therapeutic Activity: Bed Mobility: Patient performed supine to/from sit with supervision in a flat bed without use of bed rails during session.  Transfers: Patient performed sit to/from stand x1 and stand pivot transfers x2 with close supervision for safety. He performed a squat pivot transfer to the recliner with min A in the ADL apartment and a slide board transfer back to the w/c with CGA and total A for board placement due to x2 failed attempts to stand from the recliner. Provided verbal cues and demonstration for squat pivot transfer. Educated on use of different transfer techniques with fatigue, energy conservation techniques, and activation of emergency response if he is unable to get up from furniture at home if needed.   Gait Training:  Patient ambulated 6 feet, limited by UE fatigue, using RW with CGA and w/c follow for safety. Ambulated with hop-to gait pattern on R with decreased R foot clearance and increased force at initial contact due to UE fatigue. Provided verbal cues for use of UEs to reduce impact at initial contact and increase foot clearance for safety.  Wheelchair Mobility:  Patient propelled wheelchair >150 feet x2 with increased time independently.  PT re-wrapped his residual limb at end of session for demonstration of correct tightness (`50% pull with more pull at the bottom than the top). Noted minimal drainage on dressing from mid section of wound. PT applied small dressing to  control drainage prior to wrapping with 2-4" ace wraps.   Patient in bed at end of session with breaks locked, bed alarm set, and all needs within reach.    Therapy Documentation Precautions:  Precautions Precautions: Fall Precaution Comments: monitor HR, sats and BP Restrictions Weight Bearing Restrictions: Yes LLE Weight Bearing: Non weight bearing Other Position/Activity Restrictions: L AKA - in ace wrap.  no drainage seen through wrap    Therapy/Group: Individual Therapy  Reshard Guillet L Kreg Earhart 09/09/2019, 4:16 PM

## 2019-09-09 NOTE — Progress Notes (Signed)
Social Work Patient ID: Joseph Hoffman, male   DOB: Feb 11, 1951, 68 y.o.   MRN: [id number]  Met with pt to discuss team conference goals of [place] and discharge still 10/13. He is feeling good and surgeon did look at incision and reports good. Wife has been in for education and son coming from [place] to assist.

## 2019-09-09 NOTE — Progress Notes (Signed)
ANTICOAGULATION CONSULT NOTE  Pharmacy Consult for warfarin Indication: Mechanical AVR  Patient Measurements: Height: 5\' 11"  (180.3 cm) Weight: 220 lb 3.8 oz (99.9 kg) IBW/kg (Calculated) : 75.3  Vital Signs: Temp: 98.2 F (36.8 C) (10/07 0548) BP: 116/49 (10/07 0548) Pulse Rate: 65 (10/07 0548)  Labs: Recent Labs    09/07/19 0738 09/08/19 0507 09/09/19 0452  HGB 9.2*  --   --   HCT 28.5*  --   --   PLT 230  --   --   LABPROT 29.5* 28.8* 29.5*  INR 2.9* 2.8* 2.9*  CREATININE 1.70*  --   --      Assessment: 68 year old male on warfarin for mechanical AVR who received INR reversal (Vitamin K 5mg  IV) on 9/9 for an AKA. Patient was on heparin as a bridge, warfarin was resumed per pharmacy dosing on 9/12. PTA warfarin dose: 10 mg daily except 12.5 mg on Tues/Thurs/Sat. INR today is therapeutic at 2.9. Has intermittently required Lovenox for subtherapeutic INR - due to patient inadvertently missing a dose on 09/01/19.   Goal of Therapy:  INR 2.5-3.5 Monitor platelets by anticoagulation protocol: Yes   Plan:  - Warfarin 10 mg po x1 - Daily INR   [name] 09/09/2019 10:05 AM

## 2019-09-09 NOTE — Progress Notes (Signed)
Occupational Therapy Session Note  Patient Details  Name: Joseph Hoffman MRN: [id number] Date of Birth: 1951/06/07  Today's Date: 09/09/2019 OT Individual Time: 1300-1345 OT Individual Time Calculation (min): 45 min    Short Term Goals: Week 4:  OT Short Term Goal 1 (Week 4): STG=LTG secndary to ELOS  Skilled Therapeutic Interventions/Progress Updates:    OT intervention with focus on functional transfers, standing balance, discharge planning, w/c mobility, and safety awareness to increase independence with BADLs. Pt CGA for functional transfers.  Discussed placement of tub bench for use in shower.  Discussed use of cushion to elevate recliner height to facilitate independence with transfers. Pt engaged in sit<>stand from mat at 19" to simulate recliner height.  Pt propelled w/c back to room and transferred to toilet.  Transfers and toileting with CGA.  Pt remained in w/c with NT present to assist back to bed.   Therapy Documentation Precautions:  Precautions Precautions: Fall Precaution Comments: monitor HR, sats and BP Restrictions Weight Bearing Restrictions: Yes LLE Weight Bearing: Non weight bearing Other Position/Activity Restrictions: L AKA - in ace wrap.  no drainage seen through wrap Pain:  Pt denies pain this afternoon.   Therapy/Group: Individual Therapy  [name] 09/09/2019, 3:04 PM

## 2019-09-09 NOTE — Progress Notes (Signed)
[place] PHYSICAL MEDICINE & REHABILITATION PROGRESS NOTE   Subjective/Complaints:  Pt reports feeling good today, but yesterday had 6+ bouts of diarrhea- probably due to multiple bowel meds has been on- no other issues- bowels stopped after dose of fiber last night- no imodium- made him stopped up "forever" last time received it.  ROS: Denies CP, SOB at rest, N/V/D  Objective:   No results found. Recent Labs    09/07/19 0738  WBC 9.6  HGB 9.2*  HCT 28.5*  PLT 230   Recent Labs    09/07/19 0738  NA 136  K 4.4  CL 104  CO2 23  GLUCOSE 186*  BUN 46*  CREATININE 1.70*  CALCIUM 9.4    Intake/Output Summary (Last 24 hours) at 09/09/2019 0906 Last data filed at 09/09/2019 0727 Gross per 24 hour  Intake 1182 ml  Output 600 ml  Net 582 ml     Physical Exam: Vital Signs Blood pressure (!) 116/49, pulse 65, temperature 98.2 F (36.8 C), resp. rate 18, height 5\' 11"  (1.803 m), weight 99.9 kg, SpO2 98 %. Constitutional: No distress . Vital signs and labs reviewed. Sitting up in manual w/c; wrapping ACE wrap for L AKA, PT in room, NAD HENT: Normocephalic.  Atraumatic. Eyes: EOMI. Conjugate gaze Cardiovascular:  Irregularly irregular. +Click. Respiratory: Normal effort.  No stridor. GI: Non-distended. Skin: Left AKA with poorly wrapped ACE (hasn't been rewrapped since yesterday AM) wrap- little drainage midline- mainly serous- improving edema; less Tenderness; no erythema; staples intact.  Psych: Normal mood.  Normal behavior. Musc: Left AKA with edema and tenderness Neurological: Alert Motor: Bilateral upper extremities: 5/5 proximal distal Right lower extremity: 4/5 proximal distal  Left lower extremity: 4+/5 (pain inhibition)    Assessment/Plan: 1. Functional deficits secondary to new L AKA due to ischemia/gangrene which require 3+ hours per day of interdisciplinary therapy in a comprehensive inpatient rehab setting.  Physiatrist is providing close team  supervision and 24 hour management of active medical problems listed below.  Physiatrist and rehab team continue to assess barriers to discharge/monitor patient progress toward functional and medical goals  Care Tool:  Bathing    Body parts bathed by patient: Right arm, Left arm, Chest, Abdomen, Front perineal area, Right upper leg, Right lower leg, Face, Buttocks, Left upper leg   Body parts bathed by helper: Buttocks, Right lower leg Body parts n/a: Left lower leg   Bathing assist Assist Level: Minimal Assistance - Patient > 75%     Upper Body Dressing/Undressing Upper body dressing   What is the patient wearing?: Pull over shirt    Upper body assist Assist Level: Independent    Lower Body Dressing/Undressing Lower body dressing      What is the patient wearing?: Pants, Underwear/pull up     Lower body assist Assist for lower body dressing: Contact Guard/Touching assist     Toileting Toileting    Toileting assist Assist for toileting: Contact Guard/Touching assist Assistive Device Comment: bedpan   Transfers Chair/bed transfer  Transfers assist  Chair/bed transfer activity did not occur: Safety/medical concerns  Chair/bed transfer assist level: Contact Guard/Touching assist Chair/bed transfer assistive device: Programmer, multimedia   Ambulation assist   Ambulation activity did not occur: Safety/medical concerns(labial BP and HR sitting EOB.)  Assist level: Contact Guard/Touching assist Assistive device: Walker-rolling Max distance: 4'   Walk 10 feet activity   Assist  Walk 10 feet activity did not occur: Safety/medical concerns(labial BP and HR sitting EOB.)  Assist level: Contact Guard/Touching assist Assistive device: Walker-rolling   Walk 50 feet activity   Assist Walk 50 feet with 2 turns activity did not occur: Safety/medical concerns(labial BP and HR sitting EOB.)         Walk 150 feet activity   Assist Walk 150 feet  activity did not occur: Safety/medical concerns(labial BP and HR sitting EOB.)         Walk 10 feet on uneven surface  activity   Assist Walk 10 feet on uneven surfaces activity did not occur: Safety/medical concerns(labial BP and HR sitting EOB.)         Wheelchair     Assist Will patient use wheelchair at discharge?: Yes Type of Wheelchair: Manual Wheelchair activity did not occur: Safety/medical concerns(labial BP and HR sitting EOB.)  Wheelchair assist level: Independent Max wheelchair distance: 28'    Wheelchair 50 feet with 2 turns activity    Assist    Wheelchair 50 feet with 2 turns activity did not occur: Safety/medical concerns(labial BP and HR sitting EOB.)   Assist Level: Independent   Wheelchair 150 feet activity     Assist  Wheelchair 150 feet activity did not occur: Safety/medical concerns(labial BP and HR sitting EOB.)   Assist Level: Independent   Blood pressure (!) 116/49, pulse 65, temperature 98.2 F (36.8 C), resp. rate 18, height 5\' 11"  (1.803 m), weight 99.9 kg, SpO2 98 %.  Medical Problem List and Plan: 1.Fxnl and mobility deficitssecondary to PAD causing left AKA  Cont CIR 2.Mechanical AVR/Antithrombotics: -DVT/anticoagulation:Pharmaceutical:Coumadin    INR therapeutic on 10/5 -antiplatelet therapy: N/A 3. Pain Management:  Scheduled low dose flexeril tid to help with spasms.  May need neurontin titrate upwarrds. Scheduled tramadol 50 mg TID and will use oxy only when needs it.  Relatively controlled on 10/5 4. Mood:LCSW to follow for evaluation and support. -antipsychotic agents: N/A 5. Neuropsych: This patientiscapable of making decisions on hisown behalf. 6. Skin/Wound Care:Monitor wound daily. Monitor stump for signs and symptoms of infection  Daily dressings, drainage decreased  9/28- changed ACE wrap to TID per surgeon; ordered retention sock and  shrinker for after d/c.  10/2- will con't retention sock and ACE wrap until 101/0- they will remove staples 10/10 per Dr Ainsley Spinner office  10/6- mild drainage- mainly serous- reeducated nursing to rewrap ACE 3-4x/day for edema control 7. Fluids/Electrolytes/Nutrition:Monitor I/Os 8. T2DM: Monitor BS ac/hs. Continue Lantus 40 mg bid with Victoza  Resumed amaryl 2mg   CBG (last 3)  Recent Labs    09/08/19 1625 09/08/19 2111 09/09/19 0612  GLUCAP 118* 132* 80   9/26- lantus increased to 47u bid, decreased to 44 on 10/4   -changed dto CM diet  10/6- reduced Lantus to 40 units BID again- pt said eating well- isn't cause of low BGs.  10/7- BGs good control today/last 24 hours with reduction in Lantus 9. CAD/CAF: Monitor for presyncope/syncope as back on Entresto.   9/21- Cards- decr Metoprolol and Digoxin- pt feeling much better   9/25- Digoxin stopped per Cardiology  Wide pulse pressure 10 Chronic systolic CHF: Heart healthy diet and monitor for signs of overload. Check weight daily. On Entresto, Demadex, metoprolol and fenofibrate, Vascepa.    Filed Weights   09/05/19 0500 09/06/19 0545 09/07/19 0521  Weight: 101.3 kg 101.6 kg 99.9 kg   Weights stable 10/5 11. Urinary retention:   PVRs elevated  Continue Flomax  9/23- voiding "all night" per pt- which was new- didn't require cath  9/26 voiding continently  10/3- cannot void this AM- will monitor if needs caths again.  10/4- voiding well again-  12. Constipation:Start bowel program as this has been an issues since first surgery.   9/26--bm this morning  10/3- denies issues 13.  Acute blood loss anemia  Transfused 1 unit PRBC on 9/16  Hemoglobin 9.2 on 10/5  Continue to monitor 13. Hyponatremia:   Sodium 136 on 10/5  Continue to monitor 14. OSA: CPAP when sleeping with 2 L bleed in.  15. Acute on chronic renal failure:   Creatinine 1.70 on 10/5  Encourage fluids  Continue to monitor 16.  Transaminitis: Resolved  Continue to  monitor 17. Lethargy/malaise  Improving, ?cardiac source  LOS: 23 days A FACE TO FACE EVALUATION WAS PERFORMED  Marlyss Cissell 09/09/2019, 9:06 AM

## 2019-09-09 NOTE — Progress Notes (Addendum)
  Progress Note    09/09/2019 1:22 PM   Subjective:  No complaints; says he is getting stronger every day.  Denies fevers.  Afebrile x 24 hrs  Vitals:   09/08/19 2105 09/09/19 0548  BP:  (!) 116/49  Pulse: 80 65  Resp: 16 18  Temp:  98.2 F (36.8 C)  SpO2: 100% 98%    Physical Exam: Incisions:        CBC    Component Value Date/Time   WBC 9.6 09/07/2019 0738   RBC 3.39 (L) 09/07/2019 0738   HGB 9.2 (L) 09/07/2019 0738   HGB 13.8 04/03/2018 0809   HCT 28.5 (L) 09/07/2019 0738   HCT 42.4 04/03/2018 0809   PLT 230 09/07/2019 0738   PLT 168 04/03/2018 0809   MCV 84.1 09/07/2019 0738   MCV 84 04/03/2018 0809   MCH 27.1 09/07/2019 0738   MCHC 32.3 09/07/2019 0738   RDW 17.0 (H) 09/07/2019 0738   RDW 15.5 (H) 04/03/2018 0809   LYMPHSABS 0.8 09/07/2019 0738   MONOABS 0.9 09/07/2019 0738   EOSABS 0.9 (H) 09/07/2019 0738   BASOSABS 0.1 09/07/2019 0738    BMET    Component Value Date/Time   NA 136 09/07/2019 0738   NA 139 03/13/2019 1511   K 4.4 09/07/2019 0738   CL 104 09/07/2019 0738   CO2 23 09/07/2019 0738   GLUCOSE 186 (H) 09/07/2019 0738   BUN 46 (H) 09/07/2019 0738   BUN 42 (H) 03/13/2019 1511   CREATININE 1.70 (H) 09/07/2019 0738   CREATININE 1.06 08/23/2014 0853   CALCIUM 9.4 09/07/2019 0738   GFRNONAA 40 (L) 09/07/2019 0738   GFRAA 46 (L) 09/07/2019 0738    INR    Component Value Date/Time   INR 2.9 (H) 09/09/2019 0452     Intake/Output Summary (Last 24 hours) at 09/09/2019 1322 Last data filed at 09/09/2019 [id number] Gross per 24 hour  Intake 720 ml  Output 600 ml  Net 120 ml     Assessment/Plan:  68 y.o. male is s/p left above knee amputation on 08/13/2019  -pt doing well in rehab -incision is healing nicely.  New drainage overnight.  Appears to be serous as I could not express any at bedside but in picture from [facility] note today shows a small drop of serous drainage.  Will continue to monitor.  Will d/w Dr. Carlis Abbott.   Addendum:   Will start keflex 500mg  bid x 7 days given serous drainage and non healing issues in the past.   Ok to take staples out Saturday  Naylene Foell, PA-C [facility] [phone] 09/09/2019 1:22 PM

## 2019-09-09 NOTE — Progress Notes (Signed)
Occupational Therapy Weekly Progress Note  Patient Details  Name: Joseph Hoffman MRN: [id number] Date of Birth: 03-16-1951  Beginning of progress report period: September 02, 2019 End of progress report period: September 09, 2019   Patient has met 3 of 3 short term goals.  Pt is making steady progress with BADLs and functional transfers over the past week.  Pt performs all stand pivot transfers with CGA.  Static standing balance with supervision and dynamic standing balance for bathing with supervision.  Dynamic standing balance for LB clothing management with CGA. Pt requires occasional verbal cues for safety awareness with w/c setup prior to functional transfers.    Patient continues to demonstrate the following deficits: muscle weakness, decreased cardiorespiratoy endurance, decreased safety awareness and decreased sitting balance, decreased standing balance, decreased postural control and decreased balance strategies and therefore will continue to benefit from skilled OT intervention to enhance overall performance with BADL.  Patient progressing toward long term goals..  Continue plan of care.  OT Short Term Goals Week 3:  OT Short Term Goal 1 (Week 3): Pt will be able to bathe LB with S OT Short Term Goal 1 - Progress (Week 3): Met OT Short Term Goal 2 (Week 3): Pt will complete toileting tasks with CGA standing for clothing management OT Short Term Goal 2 - Progress (Week 3): Met OT Short Term Goal 3 (Week 3): Pt will complete toilet transfers with CGA OT Short Term Goal 3 - Progress (Week 3): Met Week 4:  OT Short Term Goal 1 (Week 4): STG=LTG secndary to ELOS   [name] 09/09/2019, 7:09 AM

## 2019-09-09 NOTE — Patient Care Conference (Signed)
Inpatient RehabilitationTeam Conference and Plan of Care Update Date: 09/09/2019   Time: 10:25 AM    Patient Name: Joseph Hoffman      Medical Record Number: [id number]  Date of Birth: 09-Mar-1951 Sex: Male         Room/Bed: 4W21C/4W21C-01 Payor Info: Payor: MEDICARE / Plan: MEDICARE PART A AND B / Product Type: *No Product type* /    Admit Date/Time:  08/17/2019  5:57 PM  Primary Diagnosis:  Above knee amputation of left lower extremity [facility])  Patient Active Problem List   Diagnosis Date Noted  . Anxiety state   . Widened pulse pressure   . Transaminitis   . Acute blood loss anemia   . Labile blood glucose   . Above knee amputation of left lower extremity ([place]) 08/17/2019  . Nonhealing surgical wound 08/13/2019  . Non-healing amputation site ([place]) 08/13/2019  . Type 2 diabetes mellitus with peripheral neuropathy (HCC)   . Chronic combined systolic and diastolic CHF (congestive heart failure) ([place])   . Myalgia   . Urinary retention   . Insomnia due to medical condition   . Drug-induced constipation   . Anticoagulated on Coumadin   . Unilateral complete BKA, left, initial encounter ([place]) 08/05/2019  . Leukocytosis   . Acute on chronic anemia   . Post-operative pain   . Nausea & vomiting 07/29/2019  . Diarrhea 07/29/2019  . Diabetic ulcer of left foot associated with diabetes mellitus due to underlying condition ([place]) 07/29/2019  . Left ventricular ejection fraction of 30% to 35% 07/29/2019  . PAD (peripheral artery disease) ([place]) 07/28/2019  . Long term (current) use of anticoagulants 07/13/2019  . CAD, multiple vessel 07/10/2019  . Severe aortic regurgitation   . Aortic valve disease   . Foot ulcer, left ([place]) 06/26/2019  . Depression 05/28/2019  . Congestive heart failure (CHF) ([place]) 05/13/2019  . H/O heart valve replacement with mechanical valve   . Type II diabetes mellitus with renal manifestations ([place]) 05/12/2019  . Chest pain 05/12/2019  . Elevated troponin  05/12/2019  . Stage 3 chronic kidney disease 05/12/2019  . Acute on chronic systolic (congestive) heart failure ([place]) 05/12/2019  . CRI (chronic renal insufficiency), stage 3 (moderate) 04/09/2019  . Acute combined systolic and diastolic heart failure ([place]) 04/09/2019  . Essential hypertension 06/03/2017  . Dyslipidemia, goal LDL below 70 04/11/2017  . AS (aortic stenosis) 10/28/2012  . Acute on chronic systolic heart failure, re-admitted 10/12/12 10/12/2012  . S/P AVR, 09/29/12, [employer]. (discharged 10/05/12) 09/30/2012  . Permanent atrial fibrillation, since 1994 09/30/2012  . Type 2 IDDM 09/30/2012  . OSA on CPAP 09/30/2012  . Normal coronary arteries at cath Oct 2013 09/30/2012  . Chronic anticoagulation 09/30/2012  . Severe aortic stenosis 09/25/2012    Expected Discharge Date: Expected Discharge Date: 09/15/19  Team Members Present: Physician leading conference: Dr. Courtney Heys Social Worker Present: Ovidio Kin, LCSW Nurse Present: Isla Pence, RN PT Present: Apolinar Junes, PT OT Present: Willeen Cass, OT;Roanna Epley, COTA SLP Present: Other (comment)(Erin Tamala Julian) [place] Coordinator present : Gunnar Fusi, SLP     Current Status/Progress Goal Weekly Team Focus  Bowel/Bladder   Patient is continent of bladder/bowels, LBM 09/08/19, Laxaive/stool softerner held due to frequent episodes of loose BM, immodium ordered , to be assessed, Continue Flomax -daily and Urecholine  QID as ordered  Patient will maintain contience  QS/PRN assessment   Swallow/Nutrition/ Hydration             ADL's  functional transfers-CGA; LB bathing/dressing-CGA; toileting-CGA; standing balance-CGA/S  S/CGA overall  functional transfers, discharge planning, standing balance, limb care, education   Mobility   Min-supervision A bed mobility without rails, CGA sit <>stand, supervision SBT, CGA A gait 25'' with RW, mod I w/c  S-mod I w/c level, gait 25' and 10' into bathroom with RW, 2-3" STE no  rails  Amputee education, strengthening/ROM, balance, functional mobility, w/c mobility, activity tolerance, pain/edema control, patient/family education   Communication             Safety/Cognition/ Behavioral Observations            Pain   Patient is pain free s/p Left AKA, cont. schedule Tramadol TID, and prn Oxycodone Q4H  < 3  QS/PRN assessment   Skin   s/p Left AKA site healing well, staples remain in place, no drainage, dressing  change daily per orders  No evident of infection  QS/PRN assessment of wounds and address new concerns      *See Care Plan and progress notes for long and short-term goals.     Barriers to Discharge  Current Status/Progress Possible Resolutions Date Resolved   Nursing                  PT  Decreased caregiver support;Home environment access/layout  Patient's wife can only provide supervision after first couple weeks at home; 2 STE  Putting ramp in for 2 steps           OT                  SLP                SW                Discharge Planning/Teaching Needs:  Pt doing better and medically well-working on going home with wife. Team recommended ramp unsure if will do one. Son coming in form [place] to assist both      Team Discussion:  Making good progress in therapies. Incision draining MD to call surgeon to come look at it. Pain is managed and medically much better. Son coming form [place] to assist both he and wife. Recommending ramp for home-unsure if will do prior to DC.  Revisions to Treatment Plan:  DC 10/13    Medical Summary Current Status: nursing CGA transfers; INR good-no Oxy x3+ days- on tramadol-on target for therapy- walking 25 ft with RW min assist- can wrap ACE wrap Weekly Focus/Goal: treating loose stools; brownish green drainage which is new- will call surgeon  Barriers to Discharge: Medical stability;Other (comments);Decreased family/caregiver support;Home enviroment access/layout;Wound care;Weight bearing  restrictions;Weight  Barriers to Discharge Comments: L AKA Possible Resolutions to Barriers: will call surgeon about new drainage in last 24 hours   Continued Need for Acute Rehabilitation Level of Care: The patient requires daily medical management by a physician with specialized training in physical medicine and rehabilitation for the following reasons: Direction of a multidisciplinary physical rehabilitation program to maximize functional independence : Yes Medical management of patient stability for increased activity during participation in an intensive rehabilitation regime.: Yes Analysis of laboratory values and/or radiology reports with any subsequent need for medication adjustment and/or medical intervention. : Yes   I attest that I was present, lead the team conference, and concur with the assessment and plan of the team. Teleconference held due to COVID 19   Dorris Pierre, Gardiner Rhyme 09/09/2019, 2:14 PM

## 2019-09-09 NOTE — Progress Notes (Signed)
L-AKA incision started having drainage this am--moderate to large from medial aspect. Wound examined 4 hours past last dressing change and moderate serous drainage noted from central aspect. No odor or tenderness to palpation. No erythema but wound edges appear macerated. Discussed with Samantha VVS PA who will follow up for input.

## 2019-09-09 NOTE — Progress Notes (Signed)
Recreational Therapy Session Note  Patient Details  Name: Joseph Hoffman MRN: [id number] Date of Birth: 17-Jul-1951 Today's Date: 09/09/2019 Time:  1130-1205  Pain: no c/o Skilled Therapeutic Interventions/Progress Updates:Session focused on discharge planning per pts request.  Pt wanted to provide an update on his caregiver situation at discharge and how he had problem solved through obstacles.  Pt shared that his wife was stressed trying to mange everything for his upcoming discharge and planning for her upcoming surgery.  He further stated that she was trying to manage things he could do himself while here or with the help of the social worker & rehab team.   Pt identified 2 areas that he could immediately address himself and contacted his wife to inform her of this. Pt also stated that he has continued to use relaxation strategies independently and it has been effective for him in reducing stress, stating he often falls asleep.  He is now recommending that his wife try these strategies as well.  Also provided lavender essential oil in the room for inhalation as pt states it is helpful.  Therapy/Group: Individual Therapy  Philopateer Strine 09/09/2019, 12:29 PM

## 2019-09-09 NOTE — Progress Notes (Signed)
Occupational Therapy Session Note  Patient Details  Name: Joseph Hoffman MRN: [id number] Date of Birth: 1951-02-26  Today's Date: 09/09/2019 OT Individual Time: [id number] OT Individual Time Calculation (min): 45 min    Short Term Goals: Week 4:  OT Short Term Goal 1 (Week 4): STG=LTG secndary to ELOS  Skilled Therapeutic Interventions/Progress Updates:    Pt resting in w/c upon arrival and ready for therapy.  Pt propelled w/c to gym to practice simulated shower transfers with tub transfer bench.  Pt completes transfer with CGA.  Pt wanted to practice transfers to rocking recliner again and propelled to ADL apartment.  Transfer to recliner with CGA but mod A for sit<>stand from recliner.  Pt states his recliner at home is taller.  Will practice again.  Pt returned to his room and transferred to bed.  All needs within reach and bed alarm activated.   Therapy Documentation Precautions:  Precautions Precautions: Fall Precaution Comments: monitor HR, sats and BP Restrictions Weight Bearing Restrictions: Yes LLE Weight Bearing: Non weight bearing Other Position/Activity Restrictions: L AKA - in ace wrap.  no drainage seen through wrap   Pain:  Pt denies pain   Therapy/Group: Individual Therapy  [name] 09/09/2019, 12:14 PM

## 2019-09-10 ENCOUNTER — Inpatient Hospital Stay (HOSPITAL_COMMUNITY): Payer: Medicare Other | Admitting: Rehabilitation

## 2019-09-10 ENCOUNTER — Inpatient Hospital Stay (HOSPITAL_COMMUNITY): Payer: Medicare Other

## 2019-09-10 LAB — GLUCOSE, CAPILLARY
Glucose-Capillary: 75 mg/dL (ref 70–99)
Glucose-Capillary: 101 mg/dL — ABNORMAL HIGH (ref 70–99)
Glucose-Capillary: 85 mg/dL (ref 70–99)

## 2019-09-10 LAB — PROTIME-INR
INR: 3.4 — ABNORMAL HIGH (ref 0.8–1.2)
Prothrombin Time: 34 seconds — ABNORMAL HIGH (ref 11.4–15.2)

## 2019-09-10 MED ORDER — DOXYCYCLINE HYCLATE 100 MG PO TABS
100.0000 mg | ORAL_TABLET | Freq: Two times a day (BID) | ORAL | Status: DC
  Administered 2019-09-10 – 2019-09-15 (×11): 100 mg via ORAL
  Filled 2019-09-10 (×11): qty 1

## 2019-09-10 MED ORDER — CEPHALEXIN 250 MG PO CAPS
500.0000 mg | ORAL_CAPSULE | Freq: Two times a day (BID) | ORAL | Status: DC
  Administered 2019-09-10 – 2019-09-11 (×2): 500 mg via ORAL
  Filled 2019-09-10: qty 2

## 2019-09-10 MED ORDER — WARFARIN SODIUM 7.5 MG PO TABS
7.5000 mg | ORAL_TABLET | Freq: Once | ORAL | Status: AC
  Administered 2019-09-10: 17:00:00 7.5 mg via ORAL
  Filled 2019-09-10: qty 1

## 2019-09-10 NOTE — Progress Notes (Signed)
Physical Therapy Session Note  Patient Details  Name: Joseph Hoffman MRN: [id number] Date of Birth: December 05, 1950  Today's Date: 09/10/2019 PT Individual Time: [id number] PT Individual Time Calculation (min): 45 min   Short Term Goals:  Week 4:  PT Short Term Goal 1 (Week 4): STG=LTG due to ELOS.  Skilled Therapeutic Interventions/Progress Updates:   Pt sitting EOB, re-wrapping residual limb with ACEs, as he realized that it was loose distally.  With mod cues, pt re-wrapped with ACEs, in sitting and supine.  Pt encouraged pt to use inspection mirror and feel for "windows" in ACEs as he wraps, with good carry over.  Discussed whether pt can tolerate prone yet.  He is unable; PT educated pt in a stretch for L hip flexors by lying supine  Near L edge of bed (with supervison) with L residual limb hanging slightly off of bed.  Pt tolerated 10 min.  PT added this to pt's HEP hand out.  Therapeutic exercises performed with LEs to increase strength for functional mobility; in supine- 10 x 1 L hip extension, bil hip adduction.  In R side lying 10 x 1 each L hip abduction, hip extension from neutral.  Rolling supine>< R side lying in flat bed, supervision.   Pt requested staying in bed.  Bed alarm set and needs left at hand.     Therapy Documentation Precautions:  Precautions Precautions: Fall Precaution Comments: monitor HR, sats and BP Restrictions Weight Bearing Restrictions: Yes LLE Weight Bearing: Non weight bearing Other Position/Activity Restrictions: L AKA - in ace wrap.  no drainage seen through wrap  Pain: pt denied        Therapy/Group: Individual Therapy  Spencer Peterkin 09/10/2019, 4:11 PM

## 2019-09-10 NOTE — Progress Notes (Signed)
Occupational Therapy Session Note  Patient Details  Name: Joseph Hoffman MRN: [id number] Date of Birth: [birth year]  Today's Date: 09/10/2019 OT Individual Time: [id number] OT Individual Time Calculation (min): 60 min    Short Term Goals: Week 4:  OT Short Term Goal 1 (Week 4): STG=LTG secndary to ELOS  Skilled Therapeutic Interventions/Progress Updates:    Pt resting in bed upon arrival.  ACE wrap starting to come off and pt wrapped ACE wrap appropriately on residual limb.  OT intervention with focus on w/c mobility and bed mobility on ADL apt bed with no rails.  Pt practiced sit<>supine X 3 to "fine tune" technique.  Pt completes task without assistance.  Pt requested to use toilet and transferred to Wide Drop Arm BSC with CGA. Pt returned to room and remained in w/c with NT changing bed linens.  Pt will require wide drop arm bsc to facilitate lateral leans for safe toilet hygiene and clothing management. Recommend wide drop arm BSC for use at home.  Therapy Documentation Precautions:  Precautions Precautions: Fall Precaution Comments: monitor HR, sats and BP Restrictions Weight Bearing Restrictions: Yes LLE Weight Bearing: Non weight bearing Other Position/Activity Restrictions: L AKA - in ace wrap.  no drainage seen through wrap Pain:  Pt denies pain   Therapy/Group: Individual Therapy  [name] 09/10/2019, 10:52 AM

## 2019-09-10 NOTE — Progress Notes (Addendum)
[facility] PHYSICAL MEDICINE & REHABILITATION PROGRESS NOTE   Subjective/Complaints:  Pt reports feeling good today- able to ACE wrap himself- doping renta -a-ramp- sliding board now necessary to use/take home.   ROS: Denies CP, SOB at rest, N/V/D  Objective:   No results found. No results for input(s): WBC, HGB, HCT, PLT in the last 72 hours. No results for input(s): NA, K, CL, CO2, GLUCOSE, BUN, CREATININE, CALCIUM in the last 72 hours.  Intake/Output Summary (Last 24 hours) at 09/10/2019 1616 Last data filed at 09/10/2019 0630 Gross per 24 hour  Intake 600 ml  Output 1300 ml  Net -700 ml     Physical Exam: Vital Signs Blood pressure 136/72, pulse 86, temperature 99.2 F (37.3 C), temperature source Oral, resp. rate 18, height 5\' 11"  (1.803 m), weight 99.9 kg, SpO2 97 %. Constitutional: No distress . Vital signs and labs reviewed. Sitting up in manual w/c;wearing eyeglasses; doing sliding board transfer; alone, NAD HENT: Normocephalic.  Atraumatic. Eyes: EOMI. Conjugate gaze Cardiovascular:  Irregularly irregular. +Click. Respiratory: Normal effort.  No stridor. GI: Non-distended. Skin: Left AKA well wrapped with ACE wrap- little to no drainage - improving edema; less Tenderness; no erythema; staples intact.  Psych: Normal mood.  Normal behavior. Musc: Left AKA with edema and tenderness Neurological: Alert Motor: Bilateral upper extremities: 5/5 proximal distal Right lower extremity: 4/5 proximal distal  Left lower extremity: 4+/5 (pain inhibition)    Assessment/Plan: 1. Functional deficits secondary to new L AKA due to ischemia/gangrene which require 3+ hours per day of interdisciplinary therapy in a comprehensive inpatient rehab setting.  Physiatrist is providing close team supervision and 24 hour management of active medical problems listed below.  Physiatrist and rehab team continue to assess barriers to discharge/monitor patient progress toward functional and  medical goals  Care Tool:  Bathing    Body parts bathed by patient: Right arm, Left arm, Chest, Abdomen, Front perineal area, Right upper leg, Right lower leg, Face, Buttocks, Left upper leg   Body parts bathed by helper: Buttocks, Right lower leg Body parts n/a: Left lower leg   Bathing assist Assist Level: Minimal Assistance - Patient > 75%     Upper Body Dressing/Undressing Upper body dressing   What is the patient wearing?: Pull over shirt    Upper body assist Assist Level: Independent    Lower Body Dressing/Undressing Lower body dressing      What is the patient wearing?: Pants, Underwear/pull up     Lower body assist Assist for lower body dressing: Contact Guard/Touching assist     Toileting Toileting    Toileting assist Assist for toileting: Contact Guard/Touching assist Assistive Device Comment: bedpan   Transfers Chair/bed transfer  Transfers assist  Chair/bed transfer activity did not occur: Safety/medical concerns  Chair/bed transfer assist level: Supervision/Verbal cueing Chair/bed transfer assistive device: Programmer, multimedia   Ambulation assist   Ambulation activity did not occur: Safety/medical concerns(labial BP and HR sitting EOB.)  Assist level: Contact Guard/Touching assist Assistive device: Walker-rolling Max distance: 6'   Walk 10 feet activity   Assist  Walk 10 feet activity did not occur: Safety/medical concerns(labial BP and HR sitting EOB.)  Assist level: Contact Guard/Touching assist Assistive device: Walker-rolling   Walk 50 feet activity   Assist Walk 50 feet with 2 turns activity did not occur: Safety/medical concerns(labial BP and HR sitting EOB.)         Walk 150 feet activity   Assist Walk 150 feet activity did  not occur: Safety/medical concerns(labial BP and HR sitting EOB.)         Walk 10 feet on uneven surface  activity   Assist Walk 10 feet on uneven surfaces activity did not  occur: Safety/medical concerns(labial BP and HR sitting EOB.)         Wheelchair     Assist Will patient use wheelchair at discharge?: Yes Type of Wheelchair: Manual Wheelchair activity did not occur: Safety/medical concerns(labial BP and HR sitting EOB.)  Wheelchair assist level: Independent Max wheelchair distance: 150'    Wheelchair 50 feet with 2 turns activity    Assist    Wheelchair 50 feet with 2 turns activity did not occur: Safety/medical concerns(labial BP and HR sitting EOB.)   Assist Level: Independent   Wheelchair 150 feet activity     Assist  Wheelchair 150 feet activity did not occur: Safety/medical concerns(labial BP and HR sitting EOB.)   Assist Level: Independent   Blood pressure 136/72, pulse 86, temperature 99.2 F (37.3 C), temperature source Oral, resp. rate 18, height 5\' 11"  (1.803 m), weight 99.9 kg, SpO2 97 %.  Medical Problem List and Plan: 1.Fxnl and mobility deficitssecondary to PAD causing left AKA  Cont CIR 2.Mechanical AVR/Antithrombotics: -DVT/anticoagulation:Pharmaceutical:Coumadin    INR therapeutic on 10/5 -antiplatelet therapy: N/A 3. Pain Management:  Scheduled low dose flexeril tid to help with spasms.  May need neurontin titrate upwarrds. Scheduled tramadol 50 mg TID and will use oxy only when needs it.  Relatively controlled on 10/5 4. Mood:LCSW to follow for evaluation and support. -antipsychotic agents: N/A 5. Neuropsych: This patientiscapable of making decisions on hisown behalf. 6. Skin/Wound Care:Monitor wound daily. Monitor stump for signs and symptoms of infection  Daily dressings, drainage decreased  9/28- changed ACE wrap to TID per surgeon; ordered retention sock and shrinker for after d/c.  10/2- will con't retention sock and ACE wrap until 101/0- they will remove staples 10/10 per Dr Ainsley Spinner office  10/6- mild drainage- mainly serous-  reeducated nursing to rewrap ACE 3-4x/day for edema control  10/8- can d/c staples Saturday per vascular- starting keflex, just in case per vascular 7. Fluids/Electrolytes/Nutrition:Monitor I/Os 8. T2DM: Monitor BS ac/hs. Continue Lantus 40 mg bid with Victoza  Resumed amaryl 2mg   CBG (last 3)  Recent Labs    09/09/19 2100 09/10/19 0620 09/10/19 1212  GLUCAP 303* 75 101*   9/26- lantus increased to 47u bid, decreased to 44 on 10/4   -changed dto CM diet  10/6- reduced Lantus to 40 units BID again- pt said eating well- isn't cause of low BGs.  10/7- BGs good control today/last 24 hours with reduction in Lantus  10/8- 1 BG 303- otherwise great control 9. CAD/CAF: Monitor for presyncope/syncope as back on Entresto.   9/21- Cards- decr Metoprolol and Digoxin- pt feeling much better   9/25- Digoxin stopped per Cardiology  Wide pulse pressure 10 Chronic systolic CHF: Heart healthy diet and monitor for signs of overload. Check weight daily. On Entresto, Demadex, metoprolol and fenofibrate, Vascepa.    Filed Weights   09/05/19 0500 09/06/19 0545 09/07/19 0521  Weight: 101.3 kg 101.6 kg 99.9 kg   Weights stable 10/5 11. Urinary retention:   PVRs elevated  Continue Flomax  9/23- voiding "all night" per pt- which was new- didn't require cath  9/26 voiding continently  10/3- cannot void this AM- will monitor if needs caths again.  10/4- voiding well again-  12. Constipation:Start bowel program as this has been an issues since  first surgery.   9/26--bm this morning  10/3- denies issues 13.  Acute blood loss anemia  Transfused 1 unit PRBC on 9/16  Hemoglobin 9.2 on 10/5  Continue to monitor 13. Hyponatremia:   Sodium 136 on 10/5  Continue to monitor 14. OSA: CPAP when sleeping with 2 L bleed in.  15. Acute on chronic renal failure:   Creatinine 1.70 on 10/5  Encourage fluids  Continue to monitor 16.  Transaminitis: Resolved  Continue to monitor 17. Lethargy/malaise  Improving,  ?cardiac source  LOS: 24 days A FACE TO FACE EVALUATION WAS PERFORMED  Amberlea Spagnuolo 09/10/2019, 4:16 PM

## 2019-09-10 NOTE — Progress Notes (Signed)
Physical Therapy Session Note  Patient Details  Name: Joseph Hoffman MRN: [id number] Date of Birth: December 03, 1951  Today's Date: 09/10/2019 PT Individual Time: 1300-1330 PT Individual Time Calculation (min): 30 min   Short Term Goals: Week 1:  PT Short Term Goal 1 (Week 1): Patient will perform bed mobility with min A. PT Short Term Goal 1 - Progress (Week 1): Met PT Short Term Goal 2 (Week 1): Pt will perform bed<>chair transfer w/ mod assist +2 for safety. PT Short Term Goal 2 - Progress (Week 1): Met PT Short Term Goal 3 (Week 1): Pt will tolerate sitting OOB in between therapy sessions for 1 hour. PT Short Term Goal 3 - Progress (Week 1): Met PT Short Term Goal 4 (Week 1): Pt will self-propel w/c 50' w/ supervision PT Short Term Goal 4 - Progress (Week 1): Met PT Short Term Goal 5 (Week 1): Patient will initiate standing with mod A +2 using LRAD. PT Short Term Goal 5 - Progress (Week 1): Met Week 2:  PT Short Term Goal 1 (Week 2): Patient will perform bed mobility with supervision. PT Short Term Goal 1 - Progress (Week 2): Progressing toward goal PT Short Term Goal 2 (Week 2): Patient will perform sit<>stand with min A using RW. PT Short Term Goal 2 - Progress (Week 2): Met PT Short Term Goal 3 (Week 2): Patient will perform basic transfers with min A. PT Short Term Goal 3 - Progress (Week 2): Met PT Short Term Goal 4 (Week 2): Patient will initiate ambulation with RW. PT Short Term Goal 4 - Progress (Week 2): Met Week 3:  PT Short Term Goal 1 (Week 3): Patient will perform bed mobility with supervision. PT Short Term Goal 1 - Progress (Week 3): Progressing toward goal PT Short Term Goal 2 (Week 3): Patient will be independent with HEP. PT Short Term Goal 2 - Progress (Week 3): Progressing toward goal PT Short Term Goal 3 (Week 3): Patient will perform level transfers with CGA. PT Short Term Goal 3 - Progress (Week 3): Met PT Short Term Goal 4 (Week 3): Patient will perform unlevel  transfers with min A. PT Short Term Goal 4 - Progress (Week 3): Met PT Short Term Goal 5 (Week 3): Patient will ambulate 15 feet with CGA using LRAD. PT Short Term Goal 5 - Progress (Week 3): Met  Skilled Therapeutic Interventions/Progress Updates:   Pt received lying in bed, agreeable to therapy.  Pt wanting to discuss home set up and show PT pictures on his phone of shower and bedroom/bathroom layout.  Pt was able to locate several pictures during session, but needed to be re-directed eventually, as he continued to look through to find one particular picture.  Problem solved set up of w/c and RW by bedside and how he was going to get to restroom door and then how to use RW from toilet room door to toilet.  Also briefly discussed shower set up and how he was going to place tub transfer bench.  Pt verbalized understanding.  Pt requesting to use restroom while PT in room, therefore performed sit<>stand from bed at S level, transferred to w/c at [facility] level with min cues for safety with RW.  Assisted into restroom due to difficult layout.  Pt again performed stand pivot transfer to/from bedside commode over toilet at [place].  Pt assisted out of restroom where he washed hands from w/c and transferred back to bed as above.  Pt left with all  needs in reach.   Therapy Documentation Precautions:  Precautions Precautions: Fall Precaution Comments: monitor HR, sats and BP Restrictions Weight Bearing Restrictions: Yes LLE Weight Bearing: Non weight bearing Other Position/Activity Restrictions: L AKA - in ace wrap.  no drainage seen through wrap  Pain: 7/10 pain, had been premedicated   Therapy/Group: Individual Therapy  Denice Bors 09/10/2019, 3:28 PM

## 2019-09-10 NOTE — Progress Notes (Signed)
Social Work Patient ID: Joseph Hoffman, male   DOB: [birth year], 68 y.o.   MRN: [id number]  Met with pt and spoke with wife via telephone to discuss discharge. She will be here on Monday and see him in therapies. Son to pick up equipment and take home, since opt is going home in a non-emergency ambulance due to ramp will not be in place yet. Pt doing well today and feels good. Will work toward discharge 10/13.

## 2019-09-10 NOTE — Progress Notes (Signed)
[place] for warfarin Indication: Mechanical AVR  Patient Measurements: Height: 5\' 11"  (180.3 cm) Weight: 220 lb 3.8 oz (99.9 kg) IBW/kg (Calculated) : 75.3  Vital Signs: Temp: 97.6 F (36.4 C) (10/08 0450) Temp Source: Oral (10/08 0450) BP: 123/52 (10/08 0450) Pulse Rate: 78 (10/08 0450)  Labs: Recent Labs    09/08/19 0507 09/09/19 0452 09/10/19 0445  LABPROT 28.8* 29.5* 34.0*  INR 2.8* 2.9* 3.4*     Assessment: 68 year old male on warfarin for mechanical AVR who received INR reversal (Vitamin K 5mg  IV) on 9/9 for an AKA. Patient was on heparin as a bridge, warfarin was resumed per pharmacy dosing on 9/12. PTA warfarin dose: 10 mg daily except 12.5 mg on Tues/Thurs/Sat. INR today is therapeutic at 2.9. Has intermittently required Lovenox for subtherapeutic INR - due to patient inadvertently missing a dose on 09/01/19.   Goal of Therapy:  INR 2.5-3.5 Monitor platelets by anticoagulation protocol: Yes   Plan:  - Warfarin 7.5mg  po x1 - Daily INR   Duanne Limerick PharmD. BCPS  09/10/2019 7:49 AM

## 2019-09-11 ENCOUNTER — Inpatient Hospital Stay (HOSPITAL_COMMUNITY): Payer: Medicare Other | Admitting: *Deleted

## 2019-09-11 ENCOUNTER — Inpatient Hospital Stay (HOSPITAL_COMMUNITY): Payer: Medicare Other | Admitting: Physical Therapy

## 2019-09-11 ENCOUNTER — Inpatient Hospital Stay (HOSPITAL_COMMUNITY): Payer: Medicare Other | Admitting: Occupational Therapy

## 2019-09-11 LAB — CBC WITH DIFFERENTIAL/PLATELET
Abs Immature Granulocytes: 0.08 10*3/uL — ABNORMAL HIGH (ref 0.00–0.07)
Basophils Absolute: 0.1 10*3/uL (ref 0.0–0.1)
Basophils Relative: 1 %
Eosinophils Absolute: 1.2 10*3/uL — ABNORMAL HIGH (ref 0.0–0.5)
Eosinophils Relative: 11 %
HCT: 27.9 % — ABNORMAL LOW (ref 39.0–52.0)
Hemoglobin: 8.9 g/dL — ABNORMAL LOW (ref 13.0–17.0)
Immature Granulocytes: 1 %
Lymphocytes Relative: 7 %
Lymphs Abs: 0.8 10*3/uL (ref 0.7–4.0)
MCH: 26.7 pg (ref 26.0–34.0)
MCHC: 31.9 g/dL (ref 30.0–36.0)
MCV: 83.8 fL (ref 80.0–100.0)
Monocytes Absolute: 1 10*3/uL (ref 0.1–1.0)
Monocytes Relative: 9 %
Neutro Abs: 7.8 10*3/uL — ABNORMAL HIGH (ref 1.7–7.7)
Neutrophils Relative %: 71 %
Platelets: 213 10*3/uL (ref 150–400)
RBC: 3.33 MIL/uL — ABNORMAL LOW (ref 4.22–5.81)
RDW: 17.3 % — ABNORMAL HIGH (ref 11.5–15.5)
WBC: 10.9 10*3/uL — ABNORMAL HIGH (ref 4.0–10.5)
nRBC: 0 % (ref 0.0–0.2)

## 2019-09-11 LAB — GLUCOSE, CAPILLARY
Glucose-Capillary: 148 mg/dL — ABNORMAL HIGH (ref 70–99)
Glucose-Capillary: 56 mg/dL — ABNORMAL LOW (ref 70–99)
Glucose-Capillary: 90 mg/dL (ref 70–99)
Glucose-Capillary: 119 mg/dL — ABNORMAL HIGH (ref 70–99)
Glucose-Capillary: 77 mg/dL (ref 70–99)
Glucose-Capillary: 97 mg/dL (ref 70–99)
Glucose-Capillary: 134 mg/dL — ABNORMAL HIGH (ref 70–99)

## 2019-09-11 LAB — TROPONIN I (HIGH SENSITIVITY)
Troponin I (High Sensitivity): 24 ng/L — ABNORMAL HIGH (ref <18)
Troponin I (High Sensitivity): 24 ng/L — ABNORMAL HIGH (ref <18)

## 2019-09-11 LAB — BASIC METABOLIC PANEL
Anion gap: 11 (ref 5–15)
BUN: 38 mg/dL — ABNORMAL HIGH (ref 8–23)
CO2: 23 mmol/L (ref 22–32)
Calcium: 9.2 mg/dL (ref 8.9–10.3)
Chloride: 102 mmol/L (ref 98–111)
Creatinine, Ser: 1.42 mg/dL — ABNORMAL HIGH (ref 0.61–1.24)
GFR calc Af Amer: 58 mL/min — ABNORMAL LOW (ref >60)
GFR calc non Af Amer: 50 mL/min — ABNORMAL LOW (ref >60)
Glucose, Bld: 116 mg/dL — ABNORMAL HIGH (ref 70–99)
Potassium: 4.2 mmol/L (ref 3.5–5.1)
Sodium: 136 mmol/L (ref 135–145)

## 2019-09-11 LAB — PROTIME-INR
INR: 3.5 — ABNORMAL HIGH (ref 0.8–1.2)
Prothrombin Time: 34.4 seconds — ABNORMAL HIGH (ref 11.4–15.2)

## 2019-09-11 MED ORDER — WARFARIN SODIUM 7.5 MG PO TABS
7.5000 mg | ORAL_TABLET | Freq: Once | ORAL | Status: AC
  Administered 2019-09-11: 17:00:00 7.5 mg via ORAL
  Filled 2019-09-11: qty 1

## 2019-09-11 NOTE — Progress Notes (Signed)
Occupational Therapy Session Note  Patient Details  Name: Joseph Hoffman MRN: [id number] Date of Birth: [birth year]  Today's Date: 09/11/2019 OT Individual Time: 1000-1035 OT Individual Time Calculation (min): 35 min  and Today's Date: 09/11/2019 OT Missed Time: 25 Minutes Missed Time Reason: Other (comment)(dizzy)   Short Term Goals: Week 4:  OT Short Term Goal 1 (Week 4): STG=LTG secndary to ELOS  Skilled Therapeutic Interventions/Progress Updates:    Treatment session with focus on functional mobility in home environment.  Pt received supine in bed reporting already "washed up" but agreeable to therapy session with focus on functional transfers in home environment.  Pt showed therapist pictures of his bedroom setup and discussed pt plan to utilize RW for stand pivot transfers to w/c as much as possible and use of slide board with RW is not feasible.  Pt reports dizziness once seated EOB.  BP low but WNL for pt and blood sugar WNL.  Pt completed stand pivot transfer with RW with CGA to w/c.  Completed w/c mobility, with plans to head to ADL apt, ~50' then pt stated this persisting dizziness could be related to his heart as he reports extreme fatigue.  Pt requested to return to room.  Transferred back to bed CGA stand pivot with RW and returned to supine in bed to rest.  RN notified of current condition.  Discussed limb wrapping with pt reporting he rewraps it 4x/day to ensure appropriate fit.  Therapy Documentation Precautions:  Precautions Precautions: Fall Precaution Comments: monitor HR, sats and BP Restrictions Weight Bearing Restrictions: Yes LLE Weight Bearing: Non weight bearing Other Position/Activity Restrictions: L AKA - in ace wrap.  no drainage seen through wrap General: General OT Amount of Missed Time: 25 Minutes Vital Signs: Therapy Vitals Pulse Rate: 64 BP: (!) 112/53 Patient Position (if appropriate): Sitting Pain: Pain Assessment Pain Scale: 0-10 Pain Score:  0-No pain   Therapy/Group: Individual Therapy  Simonne Come 09/11/2019, 12:27 PM

## 2019-09-11 NOTE — Progress Notes (Signed)
Joseph [facility] PHYSICAL MEDICINE & REHABILITATION PROGRESS NOTE   Subjective/Complaints:  "hunky dory"- no issues.   LBM just now.,   ROS: Denies CP, SOB at rest, N/V/D  Objective:   No results found. No results for input(s): WBC, HGB, HCT, PLT in the last 72 hours. No results for input(s): NA, K, CL, CO2, GLUCOSE, BUN, CREATININE, CALCIUM in the last 72 hours.  Intake/Output Summary (Last 24 hours) at 09/11/2019 1002 Last data filed at 09/11/2019 0823 Gross per 24 hour  Intake 920 ml  Output 700 ml  Net 220 ml     Physical Exam: Vital Signs Blood pressure (!) 117/49, pulse 69, temperature 98.4 F (36.9 C), resp. rate 18, height 5\' 11"  (1.803 m), weight 100.3 kg, SpO2 100 %. Constitutional: No distress . Vital signs and labs reviewed. Sitting up in manual w/c;wearing eyeglasses; in bathroom; LBM just now, NAD HENT: Normocephalic.  Atraumatic. Eyes: EOMI. Conjugate gaze Cardiovascular:  Irregularly irregular. +Click. Respiratory: Normal effort.  No stridor. GI: Non-distended. Skin: Left AKA well wrapped with ACE wrap- little to no drainage - improving edema; less Tenderness; no erythema; staples intact.  Psych: Normal mood.  Normal behavior. Musc: Left AKA with edema and tenderness Neurological: Alert Motor: Bilateral upper extremities: 5/5 proximal distal Right lower extremity: 4/5 proximal distal  Left lower extremity: 4+/5 (pain inhibition)    Assessment/Plan: 1. Functional deficits secondary to new L AKA due to ischemia/gangrene which require 3+ hours per day of interdisciplinary therapy in a comprehensive inpatient rehab setting.  Physiatrist is providing close team supervision and 24 hour management of active medical problems listed below.  Physiatrist and rehab team continue to assess barriers to discharge/monitor patient progress toward functional and medical goals  Care Tool:  Bathing    Body parts bathed by patient: Right arm, Left arm, Chest, Abdomen,  Front perineal area, Right upper leg, Right lower leg, Face, Buttocks, Left upper leg   Body parts bathed by helper: Buttocks, Right lower leg Body parts n/a: Left lower leg   Bathing assist Assist Level: Minimal Assistance - Patient > 75%     Upper Body Dressing/Undressing Upper body dressing   What is the patient wearing?: Pull over shirt    Upper body assist Assist Level: Independent    Lower Body Dressing/Undressing Lower body dressing      What is the patient wearing?: Pants, Underwear/pull up     Lower body assist Assist for lower body dressing: Contact Guard/Touching assist     Toileting Toileting    Toileting assist Assist for toileting: Contact Guard/Touching assist Assistive Device Comment: bedpan   Transfers Chair/bed transfer  Transfers assist  Chair/bed transfer activity did not occur: Safety/medical concerns  Chair/bed transfer assist level: Supervision/Verbal cueing Chair/bed transfer assistive device: Programmer, multimedia   Ambulation assist   Ambulation activity did not occur: Safety/medical concerns(labial BP and HR sitting EOB.)  Assist level: Contact Guard/Touching assist Assistive device: Walker-rolling Max distance: 6'   Walk 10 feet activity   Assist  Walk 10 feet activity did not occur: Safety/medical concerns(labial BP and HR sitting EOB.)  Assist level: Contact Guard/Touching assist Assistive device: Walker-rolling   Walk 50 feet activity   Assist Walk 50 feet with 2 turns activity did not occur: Safety/medical concerns(labial BP and HR sitting EOB.)         Walk 150 feet activity   Assist Walk 150 feet activity did not occur: Safety/medical concerns(labial BP and HR sitting EOB.)  Walk 10 feet on uneven surface  activity   Assist Walk 10 feet on uneven surfaces activity did not occur: Safety/medical concerns(labial BP and HR sitting EOB.)         Wheelchair     Assist Will patient  use wheelchair at discharge?: Yes Type of Wheelchair: Manual Wheelchair activity did not occur: Safety/medical concerns(labial BP and HR sitting EOB.)  Wheelchair assist level: Independent Max wheelchair distance: 150'    Wheelchair 50 feet with 2 turns activity    Assist    Wheelchair 50 feet with 2 turns activity did not occur: Safety/medical concerns(labial BP and HR sitting EOB.)   Assist Level: Independent   Wheelchair 150 feet activity     Assist  Wheelchair 150 feet activity did not occur: Safety/medical concerns(labial BP and HR sitting EOB.)   Assist Level: Independent   Blood pressure (!) 117/49, pulse 69, temperature 98.4 F (36.9 C), resp. rate 18, height 5\' 11"  (1.803 m), weight 100.3 kg, SpO2 100 %.  Medical Problem List and Plan: 1.Fxnl and mobility deficitssecondary to PAD causing left AKA  Cont CIR 2.Mechanical AVR/Antithrombotics: -DVT/anticoagulation:Pharmaceutical:Coumadin    INR therapeutic on 10/5 -antiplatelet therapy: N/A 3. Pain Management:  Scheduled low dose flexeril tid to help with spasms.  May need neurontin titrate upwarrds. Scheduled tramadol 50 mg TID and will use oxy only when needs it.  Relatively controlled on 10/5 4. Mood:LCSW to follow for evaluation and support. -antipsychotic agents: N/A 5. Neuropsych: This patientiscapable of making decisions on hisown behalf. 6. Skin/Wound Care:Monitor wound daily. Monitor stump for signs and symptoms of infection  Daily dressings, drainage decreased  9/28- changed ACE wrap to TID per surgeon; ordered retention sock and shrinker for after d/c.  10/2- will con't retention sock and ACE wrap until 101/0- they will remove staples 10/10 per Dr Ainsley Spinner office  10/6- mild drainage- mainly serous- reeducated nursing to rewrap ACE 3-4x/day for edema control  10/8- can d/c staples Saturday per vascular- starting keflex, just in  case per vascular 7. Fluids/Electrolytes/Nutrition:Monitor I/Os 8. T2DM: Monitor BS ac/hs. Continue Lantus 40 mg bid with Victoza  Resumed amaryl 2mg   CBG (last 3)  Recent Labs    09/10/19 2126 09/11/19 0640 09/11/19 0702  GLUCAP 148* 56* 90   9/26- lantus increased to 47u bid, decreased to 44 on 10/4   -changed dto CM diet  10/6- reduced Lantus to 40 units BID again- pt said eating well- isn't cause of low BGs.  10/7- BGs good control today/last 24 hours with reduction in Lantus  10/8- 1 BG 303- otherwise great control  10/9- 1 episode of BG of 56- will monitor 9. CAD/CAF: Monitor for presyncope/syncope as back on Entresto.   9/21- Cards- decr Metoprolol and Digoxin- pt feeling much better   9/25- Digoxin stopped per Cardiology  Wide pulse pressure 10 Chronic systolic CHF: Heart healthy diet and monitor for signs of overload. Check weight daily. On Entresto, Demadex, metoprolol and fenofibrate, Vascepa.    Filed Weights   09/06/19 0545 09/07/19 0521 09/11/19 0500  Weight: 101.6 kg 99.9 kg 100.3 kg   Weights stable 10/5 11. Urinary retention:   PVRs elevated  Continue Flomax  9/23- voiding "all night" per pt- which was new- didn't require cath  9/26 voiding continently  10/3- cannot void this AM- will monitor if needs caths again.  10/4- voiding well again-  12. Constipation:Start bowel program as this has been an issues since first surgery.   9/26--bm this morning  10/3-  denies issues 13.  Acute blood loss anemia  Transfused 1 unit PRBC on 9/16  Hemoglobin 9.2 on 10/5  Continue to monitor 13. Hyponatremia:   Sodium 136 on 10/5  Continue to monitor 14. OSA: CPAP when sleeping with 2 L bleed in.  15. Acute on chronic renal failure:   Creatinine 1.70 on 10/5  Encourage fluids  Continue to monitor 16.  Transaminitis: Resolved  Continue to monitor 17. Lethargy/malaise  Improving, ?cardiac source  LOS: 25 days A FACE TO FACE EVALUATION WAS PERFORMED  Joseph Hoffman 09/11/2019, 10:02 AM

## 2019-09-11 NOTE — Progress Notes (Signed)
Physical Therapy Session Note  Patient Details  Name: Joseph Hoffman MRN: [id number] Date of Birth: 24-Mar-1951  Today's Date: 09/11/2019 PT Missed Time: 60 Minutes Missed Time Reason: Other (Comment)(per [name], [place], only appropriate for bed level exercise and pt requested to rest instead)   Therapist spoke with [name], PA, and she approved pt for supine B UE and B LE exercises. However, upon arrival to pt's room he is reporting still feeling "very lethargic" despite being awake watching videos on his tablet. Pt educated on PA's approval for supine exercises, but patient deferred at this time requesting to rest today. RN notified and already aware.   [name], PT, DPT 09/11/2019, 4:00 PM

## 2019-09-11 NOTE — Progress Notes (Signed)
[place] for warfarin Indication: Mechanical AVR  Patient Measurements: Height: 5\' 11"  (180.3 cm) Weight: 221 lb 1.9 oz (100.3 kg) IBW/kg (Calculated) : 75.3  Vital Signs: Temp: 98.4 F (36.9 C) (10/09 0450) BP: 117/49 (10/09 0450) Pulse Rate: 69 (10/09 0450)  Labs: Recent Labs    09/09/19 0452 09/10/19 0445 09/11/19 0625  LABPROT 29.5* 34.0* 34.4*  INR 2.9* 3.4* 3.5*     Assessment: 68 year old male on warfarin for mechanical AVR who received INR reversal (Vitamin K 5mg  IV) on 9/9 for an AKA. Patient was on heparin as a bridge, warfarin was resumed per pharmacy dosing on 9/12. PTA warfarin dose: 10 mg daily except 12.5 mg on Tues/Thurs/Sat.   INR today is therapeutic at 3.5.    Goal of Therapy:  INR 2.5-3.5 Monitor platelets by anticoagulation protocol: Yes   Plan:  - Warfarin 7.5mg  po x1 - Daily INR   Alanda Slim, PharmD, [facility] Clinical Pharmacist Please see AMION for all Pharmacists' Contact Phone Numbers 09/11/2019, 7:27 AM

## 2019-09-11 NOTE — Progress Notes (Signed)
Patient reports that he has had chest pressure that's worsened since this am. Was too tired to do therapy this morning. Extreme fatigue dizziness reported.  He feels weak and this is different from the last time----no cough, SOB or urinary symptoms. EKG and labs ordered for work up as does have CAD pending surgery.

## 2019-09-11 NOTE — Progress Notes (Signed)
Pt had experienced extreme fatigue and chest pressure during first therapy session. Pt States he was light headed and dizzy during therapy. BP was taking 108/39. Pt continued to lay in the bed and PA was made aware. Pt continued to rest and VS were monitored. EKG was taken as well as labs. Pt stated he was feeling better and the pressure was gone as dinner was served. Pt left resting comfortably in the bed with call be in reach. Doy Hutching, LPN

## 2019-09-12 ENCOUNTER — Inpatient Hospital Stay (HOSPITAL_COMMUNITY): Payer: Medicare Other

## 2019-09-12 ENCOUNTER — Inpatient Hospital Stay (HOSPITAL_COMMUNITY): Payer: Medicare Other | Admitting: Occupational Therapy

## 2019-09-12 LAB — GLUCOSE, CAPILLARY
Glucose-Capillary: 96 mg/dL (ref 70–99)
Glucose-Capillary: 57 mg/dL — ABNORMAL LOW (ref 70–99)
Glucose-Capillary: 97 mg/dL (ref 70–99)
Glucose-Capillary: 46 mg/dL — ABNORMAL LOW (ref 70–99)
Glucose-Capillary: 67 mg/dL — ABNORMAL LOW (ref 70–99)
Glucose-Capillary: 78 mg/dL (ref 70–99)
Glucose-Capillary: 172 mg/dL — ABNORMAL HIGH (ref 70–99)
Glucose-Capillary: 184 mg/dL — ABNORMAL HIGH (ref 70–99)

## 2019-09-12 LAB — PROTIME-INR
INR: 3.1 — ABNORMAL HIGH (ref 0.8–1.2)
Prothrombin Time: 31.3 seconds — ABNORMAL HIGH (ref 11.4–15.2)

## 2019-09-12 MED ORDER — WARFARIN SODIUM 5 MG PO TABS
10.0000 mg | ORAL_TABLET | Freq: Every day | ORAL | Status: DC
  Administered 2019-09-12 – 2019-09-14 (×3): 10 mg via ORAL
  Filled 2019-09-12 (×3): qty 2

## 2019-09-12 NOTE — Progress Notes (Signed)
Occupational Therapy Session Note  Patient Details  Name: Joseph Hoffman MRN: [id number] Date of Birth: 24-Feb-1951  Today's Date: 09/12/2019 OT Individual Time: 1040-1120 and 1300-1330 OT Individual Time Calculation (min): 40 min and 30 min   Short Term Goals: Week 4:  OT Short Term Goal 1 (Week 4): STG=LTG secndary to ELOS  Skilled Therapeutic Interventions/Progress Updates:    1) Treatment session with focus on functional mobility in home environment.  Pt received upright on toilet with nursing staff present.  Pt completed stand pivot transfer with RW toilet > w/c with Supervision.  Pt propelled w/c to ADL apt without assistance, pt reporting feeling much better this date.  Engaged in stand pivot transfers bed <> w/c with RW with focus on improved sequencing. Pt continues to demonstrate difficulty with rolling and pushing up from sidelying to sitting at EOB but able to complete with increased time.  Discussed typical routine at home with meal prep/kitchen mobility.  Pt completed w/c mobility in ADL kitchen with focus on positioning of w/c to allow pt to obtain items from fridge, counter top, and stove.  Pt returned to room and transferred back to bed stand pivot with RW supervision.  2) Treatment session with focus on residual limb wrapping and furniture transfers.  Pt received supine in bed agreeable to therapy session.  Pt rewrapped residual limb with good awareness of technique and full coverage.  Completed stand pivot transfer bed > w/c with RW with supervision.  Engaged in furniture transfer w/c <> recliner with use of slide board.  Pt able to place slide board with lateral lean while on w/c and completed transfer with supervision.  When returning from recliner > w/c, pt pushed self up in to modified squat/ w/c pushup to place slide board.  CGA transfer from (lower) recliner to w/c.  Pt reports home recliner should be 3" higher therefore allowing more level transfer.   Therapy  Documentation Precautions:  Precautions Precautions: Fall Precaution Comments: monitor HR, sats and BP Restrictions Weight Bearing Restrictions: Yes LLE Weight Bearing: Non weight bearing Other Position/Activity Restrictions: L AKA - in ace wrap.  no drainage seen through wrap General:   Vital Signs: Therapy Vitals Temp: 97.6 F (36.4 C) Temp Source: Oral Pulse Rate: 88 Resp: 20 BP: (!) 122/43 Patient Position (if appropriate): Sitting Oxygen Therapy SpO2: 100 % O2 Device: Room Air Pain: Pain Assessment Pain Score: 2    Therapy/Group: Individual Therapy  Simonne Come 09/12/2019, 3:24 PM

## 2019-09-12 NOTE — Progress Notes (Signed)
ANTICOAGULATION CONSULT NOTE  Pharmacy Consult for warfarin Indication: Mechanical AVR  Patient Measurements: Height: 5\' 11"  (180.3 cm) Weight: 221 lb 5.5 oz (100.4 kg) IBW/kg (Calculated) : 75.3  Vital Signs: Temp: 98.3 F (36.8 C) (10/10 0450) BP: 124/52 (10/10 0450) Pulse Rate: 80 (10/10 0450)  Labs: Recent Labs    09/10/19 0445 09/11/19 0625 09/11/19 1301 09/11/19 1449 09/12/19 0532  HGB  --   --  8.9*  --   --   HCT  --   --  27.9*  --   --   PLT  --   --  213  --   --   LABPROT 34.0* 34.4*  --   --  31.3*  INR 3.4* 3.5*  --   --  3.1*  CREATININE  --   --  1.42*  --   --   TROPONINIHS  --   --  24* 24*  --      Assessment:  68 year old male on warfarin for mechanical AVR who received INR reversal (Vitamin K 5mg  IV) on 9/9 for an AKA. Patient was on heparin as a bridge, warfarin was resumed per pharmacy dosing on 08/15/19.  INR remains therapeutic; no bleeding reported.  PO intake has been stable.  PTA warfarin dose: 10 mg daily except 12.5 mg on Tues/Thurs/Sat.   Goal of Therapy:  INR 2.5-3.5 Monitor platelets by anticoagulation protocol: Yes   Plan:  Try Coumadin 10mg  PO daily at 1800 Daily PT / INR for now  Myli Pae D. Mina Marble, PharmD, BCPS, [place] 09/12/2019, 1:57 PM

## 2019-09-12 NOTE — Progress Notes (Signed)
[facility] PHYSICAL MEDICINE & REHABILITATION PROGRESS NOTE   Subjective/Complaints:  Episode of CP yesterday , denies heartburn, no radiating pain no neck pain.    LBM just now.,   ROS: Denies CP, SOB at rest, N/V/D  Objective:   No results found. Recent Labs    09/11/19 1301  WBC 10.9*  HGB 8.9*  HCT 27.9*  PLT 213   Recent Labs    09/11/19 1301  NA 136  K 4.2  CL 102  CO2 23  GLUCOSE 116*  BUN 38*  CREATININE 1.42*  CALCIUM 9.2    Intake/Output Summary (Last 24 hours) at 09/12/2019 0907 Last data filed at 09/12/2019 0730 Gross per 24 hour  Intake 880 ml  Output 850 ml  Net 30 ml     Physical Exam: Vital Signs Blood pressure (!) 124/52, pulse 80, temperature 98.3 F (36.8 C), resp. rate 19, height 5\' 11"  (1.803 m), weight 100.4 kg, SpO2 100 %. Constitutional: No distress . Vital signs and labs reviewed. Sitting up in manual w/c;wearing eyeglasses; in bathroom; LBM just now, NAD HENT: Normocephalic.  Atraumatic. Eyes: EOMI. Conjugate gaze Cardiovascular:  Irregularly irregular. +Click. Respiratory: Normal effort.  No stridor. GI: Non-distended. Skin: Left AKA well wrapped with ACE wrap- little to no drainage - improving edema; less Tenderness; no erythema; staples intact.  Psych: Normal mood.  Normal behavior. Musc:no chest wall or rib tenderness no neck pain to palp  Neurological: Alert Motor: Bilateral upper extremities: 5/5 proximal distal Right lower extremity: 4/5 proximal distal  Left lower extremity: 4+/5 (pain inhibition)    Assessment/Plan: 1. Functional deficits secondary to new L AKA due to ischemia/gangrene which require 3+ hours per day of interdisciplinary therapy in a comprehensive inpatient rehab setting.  Physiatrist is providing close team supervision and 24 hour management of active medical problems listed below.  Physiatrist and rehab team continue to assess barriers to discharge/monitor patient progress toward functional and  medical goals  Care Tool:  Bathing    Body parts bathed by patient: Right arm, Left arm, Chest, Abdomen, Front perineal area, Right upper leg, Right lower leg, Face, Buttocks, Left upper leg   Body parts bathed by helper: Buttocks, Right lower leg Body parts n/a: Left lower leg   Bathing assist Assist Level: Minimal Assistance - Patient > 75%     Upper Body Dressing/Undressing Upper body dressing   What is the patient wearing?: Pull over shirt    Upper body assist Assist Level: Independent    Lower Body Dressing/Undressing Lower body dressing      What is the patient wearing?: Pants, Underwear/pull up     Lower body assist Assist for lower body dressing: Contact Guard/Touching assist     Toileting Toileting    Toileting assist Assist for toileting: Contact Guard/Touching assist Assistive Device Comment: bedpan   Transfers Chair/bed transfer  Transfers assist  Chair/bed transfer activity did not occur: Safety/medical concerns  Chair/bed transfer assist level: Supervision/Verbal cueing Chair/bed transfer assistive device: Programmer, multimedia   Ambulation assist   Ambulation activity did not occur: Safety/medical concerns(labial BP and HR sitting EOB.)  Assist level: Contact Guard/Touching assist Assistive device: Walker-rolling Max distance: 6'   Walk 10 feet activity   Assist  Walk 10 feet activity did not occur: Safety/medical concerns(labial BP and HR sitting EOB.)  Assist level: Contact Guard/Touching assist Assistive device: Walker-rolling   Walk 50 feet activity   Assist Walk 50 feet with 2 turns activity did not occur: Safety/medical  concerns(labial BP and HR sitting EOB.)         Walk 150 feet activity   Assist Walk 150 feet activity did not occur: Safety/medical concerns(labial BP and HR sitting EOB.)         Walk 10 feet on uneven surface  activity   Assist Walk 10 feet on uneven surfaces activity did not  occur: Safety/medical concerns(labial BP and HR sitting EOB.)         Wheelchair     Assist Will patient use wheelchair at discharge?: Yes Type of Wheelchair: Manual Wheelchair activity did not occur: Safety/medical concerns(labial BP and HR sitting EOB.)  Wheelchair assist level: Independent Max wheelchair distance: 150'    Wheelchair 50 feet with 2 turns activity    Assist    Wheelchair 50 feet with 2 turns activity did not occur: Safety/medical concerns(labial BP and HR sitting EOB.)   Assist Level: Independent   Wheelchair 150 feet activity     Assist  Wheelchair 150 feet activity did not occur: Safety/medical concerns(labial BP and HR sitting EOB.)   Assist Level: Independent   Blood pressure (!) 124/52, pulse 80, temperature 98.3 F (36.8 C), resp. rate 19, height 5\' 11"  (1.803 m), weight 100.4 kg, SpO2 100 %.  Medical Problem List and Plan: 1.Fxnl and mobility deficitssecondary to PAD causing left AKA  Cont CIR 2.Mechanical AVR/Antithrombotics: -DVT/anticoagulation:Pharmaceutical:Coumadin    INR therapeutic on 10/5 -antiplatelet therapy: N/A 3. Pain Management:  Scheduled low dose flexeril tid to help with spasms.  May need neurontin titrate upwarrds. Scheduled tramadol 50 mg TID and will use oxy only when needs it.  Relatively controlled on 10/5 4. Mood:LCSW to follow for evaluation and support. -antipsychotic agents: N/A 5. Neuropsych: This patientiscapable of making decisions on hisown behalf. 6. Skin/Wound Care:Monitor wound daily. Monitor stump for signs and symptoms of infection  Daily dressings, drainage decreased  9/28- changed ACE wrap to TID per surgeon; ordered retention sock and shrinker for after d/c.  10/2- will con't retention sock and ACE wrap until 101/0- they will remove staples 10/10 per Dr Ainsley Spinner office  10/6- mild drainage- mainly serous- reeducated nursing  to rewrap ACE 3-4x/day for edema control  10/8- can d/c staples Saturday per vascular- starting keflex, just in case per vascular 7. Fluids/Electrolytes/Nutrition:Monitor I/Os 8. T2DM: Monitor BS ac/hs. Continue Lantus 40 mg bid with Victoza  Resumed amaryl 2mg   CBG (last 3)  Recent Labs    09/11/19 1629 09/11/19 2146 09/12/19 0616  GLUCAP 97 134* 96   10/10 controlled  9. CAD/CAF: Monitor for presyncope/syncope as back on Entresto.   9/21- Cards- decr Metoprolol and Digoxin- pt feeling much better   9/25- Digoxin stopped per Cardiology  EKG showing Afib  10 Chronic systolic CHF: Heart healthy diet and monitor for signs of overload. Check weight daily. On Entresto, Demadex, metoprolol and fenofibrate, Vascepa.  Troponin 24 c/w CHF    Filed Weights   09/07/19 0521 09/11/19 0500 09/12/19 0450  Weight: 99.9 kg 100.3 kg 100.4 kg   Weights stable 10/5 11. Urinary retention:   PVRs elevated  Continue Flomax  9/23- voiding "all night" per pt- which was new- didn't require cath  9/26 voiding continently  10/3- cannot void this AM- will monitor if needs caths again.  10/4- voiding well again-  12. Constipation:Start bowel program as this has been an issues since first surgery.   9/26--bm this morning  10/3- denies issues 13.  Acute blood loss anemia  Transfused 1 unit PRBC  on 9/16  Hemoglobin 9.2 on 10/5  Continue to monitor 13. Hyponatremia:   Sodium 136 on 10/5  Continue to monitor 14. OSA: CPAP when sleeping with 2 L bleed in.  15. Acute on chronic renal failure:   Creatinine 1.70 on 10/5  Encourage fluids  Continue to monitor 16.  Transaminitis: Resolved  Continue to monitor 17. Chest pain - likley stable angina, resolved troponin 24 c/w CHF +/- stable angina , EKG showing AFIB which is chronic  Does not appear GI or MSK  LOS: 26 days A FACE TO [place] E Kirsteins 09/12/2019, 9:07 AM

## 2019-09-12 NOTE — Progress Notes (Addendum)
Physical Therapy Session Note  Patient Details  Name: RUBEL HECKARD MRN: [id number] Date of Birth: [birth year]  Today's Date: 09/12/2019 PT Individual Time: 0151-0230 PT Individual Time Calculation (min): 39 min   Short Term Goals: Week 1:  PT Short Term Goal 1 (Week 1): Patient will perform bed mobility with min A. PT Short Term Goal 1 - Progress (Week 1): Met PT Short Term Goal 2 (Week 1): Pt will perform bed<>chair transfer w/ mod assist +2 for safety. PT Short Term Goal 2 - Progress (Week 1): Met PT Short Term Goal 3 (Week 1): Pt will tolerate sitting OOB in between therapy sessions for 1 hour. PT Short Term Goal 3 - Progress (Week 1): Met PT Short Term Goal 4 (Week 1): Pt will self-propel w/c 50' w/ supervision PT Short Term Goal 4 - Progress (Week 1): Met PT Short Term Goal 5 (Week 1): Patient will initiate standing with mod A +2 using LRAD. PT Short Term Goal 5 - Progress (Week 1): Met Week 2:  PT Short Term Goal 1 (Week 2): Patient will perform bed mobility with supervision. PT Short Term Goal 1 - Progress (Week 2): Progressing toward goal PT Short Term Goal 2 (Week 2): Patient will perform sit<>stand with min A using RW. PT Short Term Goal 2 - Progress (Week 2): Met PT Short Term Goal 3 (Week 2): Patient will perform basic transfers with min A. PT Short Term Goal 3 - Progress (Week 2): Met PT Short Term Goal 4 (Week 2): Patient will initiate ambulation with RW. PT Short Term Goal 4 - Progress (Week 2): Met Week 3:  PT Short Term Goal 1 (Week 3): Patient will perform bed mobility with supervision. PT Short Term Goal 1 - Progress (Week 3): Progressing toward goal PT Short Term Goal 2 (Week 3): Patient will be independent with HEP. PT Short Term Goal 2 - Progress (Week 3): Progressing toward goal PT Short Term Goal 3 (Week 3): Patient will perform level transfers with CGA. PT Short Term Goal 3 - Progress (Week 3): Met PT Short Term Goal 4 (Week 3): Patient will perform unlevel  transfers with min A. PT Short Term Goal 4 - Progress (Week 3): Met PT Short Term Goal 5 (Week 3): Patient will ambulate 15 feet with CGA using LRAD. PT Short Term Goal 5 - Progress (Week 3): Met  Skilled Therapeutic Interventions/Progress Updates:  Pt c/07/10 L hip pain, nursing notified and meds administered at end of session.  Treatment to tolerance.  L hip therex performed and pain did not interfere w/session. Rest as needed.    Pt initially oob in wc. wc propulsion 289f mod I on level surfaces including 2 turns. wc to mat w/verbal cues for set up, pt initally positioning self facing mat w/walker between wc and mat.  Discussed safe set up. wc to mat via lateral scoot w/set up assist, cga. Sit to supine on mat independently. In supine performed L hip flexor stretching. Supine to sidelying and performed hip abd 5x5 alternating w/hip extension w/30 sec hold x 5 Bridge w/yoga blocks under thighs (legs extended) x 15 Mat to wc via lateral scoot w/set up assist and cga. wc propulsion 2546fmod I including 2 turns wc to bed SPT w/walker w/cga, cues for safety. Sit to supine mod I. Pt w/photos of bedroom setup, discussed lateral scoot transfer option as space may make SPT w/RW difficult. Pt left supine w/rails up x 3, alarm set, bed in lowest position, and  needs in reach.   Therapy Documentation Precautions:  Precautions Precautions: Fall Precaution Comments: monitor HR, sats and BP Restrictions Weight Bearing Restrictions: Yes LLE Weight Bearing: Non weight bearing Other Position/Activity Restrictions: L AKA - in ace wrap.  no drainage seen through wrap  Therapy/Group: Individual Therapy  Callie Fielding, [place] 09/12/2019, 3:01 PM

## 2019-09-12 NOTE — Progress Notes (Signed)
Pt is continuing to have low CBG's. Prior to dinner 56, protocol followed, rechecked  67. Pt requested "coke" saying it would make his sugar increase. Rechecked again and it was 78. Dinner trays were arriving on the hall. Pt remained asymptomatic during this time. Will continue to assess. Doy Hutching, LPN

## 2019-09-12 NOTE — Progress Notes (Signed)
Pt returned from therapy and was resting comfortably in bed. BS was 57 @ 1135. Pt was given graham crackers and apple juice. Will recheck per protocol. Pt remains asymptomatic. Doy Hutching, LPN

## 2019-09-13 ENCOUNTER — Inpatient Hospital Stay (HOSPITAL_COMMUNITY): Payer: Medicare Other | Admitting: Physical Therapy

## 2019-09-13 ENCOUNTER — Inpatient Hospital Stay (HOSPITAL_COMMUNITY): Payer: Medicare Other

## 2019-09-13 LAB — GLUCOSE, CAPILLARY
Glucose-Capillary: 101 mg/dL — ABNORMAL HIGH (ref 70–99)
Glucose-Capillary: 107 mg/dL — ABNORMAL HIGH (ref 70–99)
Glucose-Capillary: 115 mg/dL — ABNORMAL HIGH (ref 70–99)
Glucose-Capillary: 150 mg/dL — ABNORMAL HIGH (ref 70–99)

## 2019-09-13 LAB — PROTIME-INR
INR: 2.7 — ABNORMAL HIGH (ref 0.8–1.2)
Prothrombin Time: 28 seconds — ABNORMAL HIGH (ref 11.4–15.2)

## 2019-09-13 NOTE — Progress Notes (Signed)
Physical Therapy Session Note  Patient Details  Name: Joseph Hoffman MRN: [id number] Date of Birth: [birth year]  Today's Date: 09/13/2019 PT Individual Time: 0102-7253  And 6644-0347 PT Individual Time Calculation (min): 45 min and 30 min  Short Term Goals: Week 4:  PT Short Term Goal 1 (Week 4): STG=LTG due to ELOS.  Skilled Therapeutic Interventions/Progress Updates: Tx1: Pt presented at EOB agreeable to therapy and requesting to use bathroom. Pt performed stand pivot transfer with RW to w/c CGA and transported to toilet. Performed stand pivot to toilet in same manner and was able to lower pants with CGA (+void). Performed STS from toilet CGA and PTA provided minA for LB clothing management pulling pants over hips. Pt propelled self to ADL apt and performed blocked practice transfers w/c to/from bed with RW. Pt indicated feels that although narrow does have enough space to turn RW approx 45 degrees to complete transfer to/from bed. Also discussed moving w/c when getting OOB to provide enough room to complete rotation. Pt then performed SB transfer to/from recliner CGA and required minA for managing SB. Pt propelled to rehab gym and performed stand pivot transfer to mat. Participated in supine hip flexion with abd, hip flexion stretch, and sidelying hip extension with min cues for technique. Pt required modA for sidelying to sit due to fatigue. Pt returned to w/c in same manner as prior and transported back to room due to time. Pt returned to bed via stand pivot and remained sitting EOB at end of session with call bell within reach and all needs met.   Tx2: Pt presented at EOB agreeable to therapy. Pt showed PTA pictures of bedroom set up in discussion to bed transfers with both in agreement that pt should be able to transfer as practiced. Pt performed Stand pivot to w/c with CGA and increased time as pt felt popping sensation upon standing with no pain associated but pt stating feeling "weak". Pt  propelled to day room and participated in Wii bowling. Attempted to perform in standing however pt stating knee feeling "wobbly" when in standing. Pt was able to use RLE to propel w/c with no pain/weakness. Pt remained in w/c at end of session and left with cell bell within reach and needs met.          Therapy Documentation Precautions:  Precautions Precautions: Fall Precaution Comments: monitor HR, sats and BP Restrictions Weight Bearing Restrictions: Yes LLE Weight Bearing: Non weight bearing Other Position/Activity Restrictions: L AKA - in ace wrap.  no drainage seen through wrap General:   Vital Signs: Therapy Vitals Temp: 97.9 F (36.6 C) Pulse Rate: 76 Resp: 20 BP: (!) 102/43 Patient Position (if appropriate): Lying Oxygen Therapy SpO2: 98 % O2 Device: Room Air Pain: Pain Assessment Pain Scale: 0-10 Pain Score: 0-No pain   Therapy/Group: Individual Therapy  Tracyann Duffell  Chane Cowden, PTA  09/13/2019, 12:23 PM

## 2019-09-13 NOTE — Plan of Care (Signed)
  Problem: Consults Goal: RH LIMB LOSS PATIENT EDUCATION Description: Description: See Patient Education module for eduction specifics. Outcome: Progressing Goal: Skin Care Protocol Initiated - if Braden Score 18 or less Description: If consults are not indicated, leave blank or document N/A Outcome: Progressing   Problem: RH BOWEL ELIMINATION Goal: RH STG MANAGE BOWEL WITH ASSISTANCE Description: STG Manage Bowel with  mod I Assistance. Outcome: Progressing Goal: RH STG MANAGE BOWEL W/MEDICATION W/ASSISTANCE Description: STG Manage Bowel with Medication with mod I Assistance. Outcome: Progressing   Problem: RH BLADDER ELIMINATION Goal: RH STG MANAGE BLADDER WITH ASSISTANCE Description: STG Manage Bladder With min Assistance Outcome: Progressing Goal: RH STG MANAGE BLADDER WITH MEDICATION WITH ASSISTANCE Description: STG Manage Bladder With Medication With  mod I Assistance. Outcome: Progressing   Problem: RH SKIN INTEGRITY Goal: RH STG SKIN FREE OF INFECTION/BREAKDOWN Description: With min assist Outcome: Progressing Goal: RH STG MAINTAIN SKIN INTEGRITY WITH ASSISTANCE Description: STG Maintain Skin Integrity With  min Assistance. Outcome: Progressing Goal: RH STG ABLE TO PERFORM INCISION/WOUND CARE W/ASSISTANCE Description: STG Able To Perform Incision/Wound Care With min Assistance. Outcome: Progressing   Problem: RH SAFETY Goal: RH STG ADHERE TO SAFETY PRECAUTIONS W/ASSISTANCE/DEVICE Description: STG Adhere to Safety Precautions With cues/reminders Assistance/Device. Outcome: Progressing   Problem: RH PAIN MANAGEMENT Goal: RH STG PAIN MANAGED AT OR BELOW PT'S PAIN GOAL Description: Pain level less than 4 on scale of 0-10 Outcome: Progressing   Problem: RH KNOWLEDGE DEFICIT LIMB LOSS Goal: RH STG INCREASE KNOWLEDGE OF SELF CARE AFTER LIMB LOSS Description: Pt will be able to demonstrate dressing change to residual limb with min assist.  Pt will be able to  demonstrate importance of adherence to medication regimen to control diabetes, hypertension and manage cardiac issues independently using handouts and booklet provided.  Outcome: Progressing

## 2019-09-13 NOTE — Progress Notes (Signed)
Occupational Therapy Session Note  Patient Details  Name: Joseph Hoffman MRN: [id number] Date of Birth: 1951/02/04  Today's Date: 09/13/2019 OT Individual Time: 1515-1600 OT Individual Time Calculation (min): 45 min    Short Term Goals: Week 4:  OT Short Term Goal 1 (Week 4): STG=LTG secndary to ELOS  Skilled Therapeutic Interventions/Progress Updates:    Pt received supine, initially declining therapy d/t pain in R knee. Pt agreeable to UE only exercise to increase ease with ADL transfers. Pt used slideboard to transfer from EOB to w/c with (S). Pt able to propel chair to therapy gym, 100 ft, with (S). Pt was provided 5lb dumbbells which he used to complete 3x10 BUE strengthening circuit. Circuit included focus on biceps, anterior/middle deltoids, pec. Major, and triceps. Pt reported no further pain throughout session. Pt returned to his room and was left supine with all needs met.   Therapy Documentation Precautions:  Precautions Precautions: Fall Precaution Comments: monitor HR, sats and BP Restrictions Weight Bearing Restrictions: Yes LLE Weight Bearing: Non weight bearing Other Position/Activity Restrictions: L AKA - in ace wrap.  no drainage seen through wrap   Therapy/Group: Individual Therapy  Curtis Sites 09/13/2019, 4:16 PM

## 2019-09-13 NOTE — Progress Notes (Signed)
ANTICOAGULATION CONSULT NOTE  Pharmacy Consult for warfarin Indication: Mechanical AVR  Patient Measurements: Height: 5\' 11"  (180.3 cm) Weight: 221 lb 5.5 oz (100.4 kg) IBW/kg (Calculated) : 75.3  Vital Signs: Temp: 97.9 F (36.6 C) (10/11 1214) Temp Source: Oral (10/11 0507) BP: 102/43 (10/11 1214) Pulse Rate: 76 (10/11 1214)  Labs: Recent Labs    09/11/19 0625 09/11/19 1301 09/11/19 1449 09/12/19 0532 09/13/19 0903  HGB  --  8.9*  --   --   --   HCT  --  27.9*  --   --   --   PLT  --  213  --   --   --   LABPROT 34.4*  --   --  31.3* 28.0*  INR 3.5*  --   --  3.1* 2.7*  CREATININE  --  1.42*  --   --   --   TROPONINIHS  --  24* 24*  --   --      Assessment:  68 year old male on warfarin for mechanical AVR who received INR reversal (Vitamin K 5mg  IV) on 9/9 for an AKA. Patient was on heparin as a bridge, warfarin was resumed per pharmacy dosing on 08/15/19.  INR remains therapeutic - it trended down, likely from the two 7.5mg  Coumadin doses; no bleeding reported.  PO intake has been stable.  PTA warfarin dose: 10 mg daily except 12.5 mg on Tues/Thurs/Sat.   Goal of Therapy:  INR 2.5-3.5 Monitor platelets by anticoagulation protocol: Yes   Plan:  Coumadin 10mg  PO daily at 1800 Daily PT / INR for now  Tyon Cerasoli D. Mina Marble, PharmD, BCPS, [place] 09/13/2019, 12:51 PM

## 2019-09-13 NOTE — Progress Notes (Signed)
[facility] PHYSICAL MEDICINE & REHABILITATION PROGRESS NOTE   Subjective/Complaints:  No further chest discomfort    ROS: Denies CP, SOB at rest, N/V/D  Objective:   No results found. Recent Labs    09/11/19 1301  WBC 10.9*  HGB 8.9*  HCT 27.9*  PLT 213   Recent Labs    09/11/19 1301  NA 136  K 4.2  CL 102  CO2 23  GLUCOSE 116*  BUN 38*  CREATININE 1.42*  CALCIUM 9.2    Intake/Output Summary (Last 24 hours) at 09/13/2019 0941 Last data filed at 09/13/2019 [id number] Gross per 24 hour  Intake 1240 ml  Output 800 ml  Net 440 ml     Physical Exam: Vital Signs Blood pressure (!) 124/43, pulse 62, temperature 98.2 F (36.8 C), temperature source Oral, resp. rate 16, height 5\' 11"  (1.803 m), weight 100.4 kg, SpO2 100 %. Constitutional: No distress . Vital signs and labs reviewed. Sitting up in manual w/c;wearing eyeglasses; in bathroom; LBM just now, NAD HENT: Normocephalic.  Atraumatic. Eyes: EOMI. Conjugate gaze Cardiovascular:  Irregularly irregular. +Click. Respiratory: Normal effort.  No stridor. GI: Non-distended. Skin: Left AKA well wrapped with ACE wrap- little to no drainage - improving edema; less Tenderness; no erythema; staples intact.  Psych: Normal mood.  Normal behavior. Musc:no chest wall or rib tenderness no neck pain to palp  Neurological: Alert Motor: Bilateral upper extremities: 5/5 proximal distal Right lower extremity: 4/5 proximal distal  Left lower extremity: 4+/5 (pain inhibition)    Assessment/Plan: 1. Functional deficits secondary to new L AKA due to ischemia/gangrene which require 3+ hours per day of interdisciplinary therapy in a comprehensive inpatient rehab setting.  Physiatrist is providing close team supervision and 24 hour management of active medical problems listed below.  Physiatrist and rehab team continue to assess barriers to discharge/monitor patient progress toward functional and medical goals  Care  Tool:  Bathing    Body parts bathed by patient: Right arm, Left arm, Chest, Abdomen, Front perineal area, Right upper leg, Right lower leg, Face, Buttocks, Left upper leg   Body parts bathed by helper: Buttocks, Right lower leg Body parts n/a: Left lower leg   Bathing assist Assist Level: Minimal Assistance - Patient > 75%     Upper Body Dressing/Undressing Upper body dressing   What is the patient wearing?: Pull over shirt    Upper body assist Assist Level: Independent    Lower Body Dressing/Undressing Lower body dressing      What is the patient wearing?: Pants, Underwear/pull up     Lower body assist Assist for lower body dressing: Contact Guard/Touching assist     Toileting Toileting    Toileting assist Assist for toileting: Contact Guard/Touching assist Assistive Device Comment: bedpan   Transfers Chair/bed transfer  Transfers assist  Chair/bed transfer activity did not occur: Safety/medical concerns  Chair/bed transfer assist level: Contact Guard/Touching assist Chair/bed transfer assistive device: Programmer, multimedia   Ambulation assist   Ambulation activity did not occur: Safety/medical concerns(labial BP and HR sitting EOB.)  Assist level: Contact Guard/Touching assist Assistive device: Walker-rolling Max distance: 6'   Walk 10 feet activity   Assist  Walk 10 feet activity did not occur: Safety/medical concerns(labial BP and HR sitting EOB.)  Assist level: Contact Guard/Touching assist Assistive device: Walker-rolling   Walk 50 feet activity   Assist Walk 50 feet with 2 turns activity did not occur: Safety/medical concerns(labial BP and HR sitting EOB.)  Walk 150 feet activity   Assist Walk 150 feet activity did not occur: Safety/medical concerns(labial BP and HR sitting EOB.)         Walk 10 feet on uneven surface  activity   Assist Walk 10 feet on uneven surfaces activity did not occur: Safety/medical  concerns(labial BP and HR sitting EOB.)         Wheelchair     Assist Will patient use wheelchair at discharge?: Yes Type of Wheelchair: Manual Wheelchair activity did not occur: Safety/medical concerns(labial BP and HR sitting EOB.)  Wheelchair assist level: Independent Max wheelchair distance: 150'    Wheelchair 50 feet with 2 turns activity    Assist    Wheelchair 50 feet with 2 turns activity did not occur: Safety/medical concerns(labial BP and HR sitting EOB.)   Assist Level: Independent   Wheelchair 150 feet activity     Assist  Wheelchair 150 feet activity did not occur: Safety/medical concerns(labial BP and HR sitting EOB.)   Assist Level: Independent   Blood pressure (!) 124/43, pulse 62, temperature 98.2 F (36.8 C), temperature source Oral, resp. rate 16, height 5\' 11"  (1.803 m), weight 100.4 kg, SpO2 100 %.  Medical Problem List and Plan: 1.Fxnl and mobility deficitssecondary to PAD causing left AKA  Cont CIR PT, OT, SLP  2.Mechanical AVR/Antithrombotics: -DVT/anticoagulation:Pharmaceutical:Coumadin    INR therapeutic 2.7  on 10/11 -antiplatelet therapy: N/A 3. Pain Management:  Scheduled low dose flexeril tid to help with spasms.  May need neurontin titrate upwarrds. Scheduled tramadol 50 mg TID and will use oxy only when needs it.  Relatively controlled on 10/5 4. Mood:LCSW to follow for evaluation and support. -antipsychotic agents: N/A 5. Neuropsych: This patientiscapable of making decisions on hisown behalf. 6. Skin/Wound Care:Monitor wound daily. Monitor stump for signs and symptoms of infection  Daily dressings, drainage decreased  9/28- changed ACE wrap to TID per surgeon; ordered retention sock and shrinker for after d/c.  10/2- will con't retention sock and ACE wrap until 101/0- they will remove staples 10/10 per Dr Ainsley Spinner office  10/6- mild drainage- mainly  serous- reeducated nursing to rewrap ACE 3-4x/day for edema control  10/8- can d/c staples Saturday per vascular- starting keflex, just in case per vascular Staples removed 10/10 7. Fluids/Electrolytes/Nutrition:Monitor I/Os 8. T2DM: Monitor BS ac/hs. Continue Lantus 40 mg bid with Victoza  Resumed amaryl 2mg   CBG (last 3)  Recent Labs    09/12/19 1939 09/12/19 2057 09/13/19 0621  GLUCAP 172* 184* 101*   10/11 controlled  9. CAD/CAF: Monitor for presyncope/syncope as back on Entresto.   9/21- Cards- decr Metoprolol and Digoxin- pt feeling much better   9/25- Digoxin stopped per Cardiology  EKG showing Afib  10 Chronic systolic CHF: Heart healthy diet and monitor for signs of overload. Check weight daily. On Entresto, Demadex, metoprolol and fenofibrate, Vascepa.  Troponin 24 c/w CHF    Filed Weights   09/07/19 0521 09/11/19 0500 09/12/19 0450  Weight: 99.9 kg 100.3 kg 100.4 kg   Weights stable 10/10 11. Urinary retention:   PVRs elevated  Continue Flomax  9/23- voiding "all night" per pt- which was new- didn't require cath  9/26 voiding continently  10/3- cannot void this AM- will monitor if needs caths again.  10/4- voiding well again-  12. Constipation:Start bowel program as this has been an issues since first surgery.   9/26--bm this morning  10/3- denies issues 13.  Acute blood loss anemia  Transfused 1 unit PRBC on 9/16  Hemoglobin 9.2 on 10/5  Continue to monitor 13. Hyponatremia:   Sodium 136 on 10/5  Continue to monitor 14. OSA: CPAP when sleeping with 2 L bleed in.  15. Acute on chronic renal failure:   Creatinine 1.70 on 10/5  Encourage fluids  Continue to monitor 16.  Transaminitis: Resolved  Continue to monitor 17. Chest pain - likley stable angina, resolved troponin 24 c/w CHF +/- stable angina , EKG showing AFIB which is chronic  Does not appear GI or MSK  LOS: 27 days A FACE TO [place] E Kirsteins 09/13/2019, 9:41  AM

## 2019-09-14 ENCOUNTER — Inpatient Hospital Stay (HOSPITAL_COMMUNITY): Payer: Medicare Other

## 2019-09-14 ENCOUNTER — Ambulatory Visit (HOSPITAL_COMMUNITY): Payer: Medicare Other

## 2019-09-14 LAB — CBC WITH DIFFERENTIAL/PLATELET
Abs Immature Granulocytes: 0.07 10*3/uL (ref 0.00–0.07)
Basophils Absolute: 0.1 10*3/uL (ref 0.0–0.1)
Basophils Relative: 1 %
Eosinophils Absolute: 1.3 10*3/uL — ABNORMAL HIGH (ref 0.0–0.5)
Eosinophils Relative: 15 %
HCT: 27.1 % — ABNORMAL LOW (ref 39.0–52.0)
Hemoglobin: 8.4 g/dL — ABNORMAL LOW (ref 13.0–17.0)
Immature Granulocytes: 1 %
Lymphocytes Relative: 8 %
Lymphs Abs: 0.7 10*3/uL (ref 0.7–4.0)
MCH: 26.4 pg (ref 26.0–34.0)
MCHC: 31 g/dL (ref 30.0–36.0)
MCV: 85.2 fL (ref 80.0–100.0)
Monocytes Absolute: 0.8 10*3/uL (ref 0.1–1.0)
Monocytes Relative: 9 %
Neutro Abs: 5.6 10*3/uL (ref 1.7–7.7)
Neutrophils Relative %: 66 %
Platelets: 185 10*3/uL (ref 150–400)
RBC: 3.18 MIL/uL — ABNORMAL LOW (ref 4.22–5.81)
RDW: 17.2 % — ABNORMAL HIGH (ref 11.5–15.5)
WBC: 8.5 10*3/uL (ref 4.0–10.5)
nRBC: 0 % (ref 0.0–0.2)

## 2019-09-14 LAB — GLUCOSE, CAPILLARY
Glucose-Capillary: 75 mg/dL (ref 70–99)
Glucose-Capillary: 125 mg/dL — ABNORMAL HIGH (ref 70–99)
Glucose-Capillary: 134 mg/dL — ABNORMAL HIGH (ref 70–99)
Glucose-Capillary: 118 mg/dL — ABNORMAL HIGH (ref 70–99)

## 2019-09-14 LAB — BASIC METABOLIC PANEL
Anion gap: 12 (ref 5–15)
BUN: 49 mg/dL — ABNORMAL HIGH (ref 8–23)
CO2: 24 mmol/L (ref 22–32)
Calcium: 9.3 mg/dL (ref 8.9–10.3)
Chloride: 101 mmol/L (ref 98–111)
Creatinine, Ser: 1.6 mg/dL — ABNORMAL HIGH (ref 0.61–1.24)
GFR calc Af Amer: 50 mL/min — ABNORMAL LOW (ref >60)
GFR calc non Af Amer: 43 mL/min — ABNORMAL LOW (ref >60)
Glucose, Bld: 81 mg/dL (ref 70–99)
Potassium: 4 mmol/L (ref 3.5–5.1)
Sodium: 137 mmol/L (ref 135–145)

## 2019-09-14 LAB — PROTIME-INR
INR: 2.7 — ABNORMAL HIGH (ref 0.8–1.2)
Prothrombin Time: 28.3 seconds — ABNORMAL HIGH (ref 11.4–15.2)

## 2019-09-14 MED ORDER — INSULIN GLARGINE 100 UNIT/ML ~~LOC~~ SOLN
38.0000 [IU] | Freq: Two times a day (BID) | SUBCUTANEOUS | Status: DC
  Administered 2019-09-14 – 2019-09-15 (×2): 38 [IU] via SUBCUTANEOUS
  Filled 2019-09-14 (×3): qty 0.38

## 2019-09-14 MED ORDER — HYDROCORTISONE 1 % EX CREA: TOPICAL_CREAM | Freq: Three times a day (TID) | CUTANEOUS | 0 refills | Status: AC

## 2019-09-14 NOTE — Progress Notes (Signed)
Occupational Therapy Session Note  Patient Details  Name: Joseph Hoffman MRN: [id number] Date of Birth: Sep 15, 1951  Today's Date: 09/14/2019 OT Individual Time: 1000-1100 OT Individual Time Calculation (min): 60 min    Short Term Goals: Week 4:  OT Short Term Goal 1 (Week 4): STG=LTG secndary to ELOS  Skilled Therapeutic Interventions/Progress Updates:    Pt sitting EOB upon arrival.  OT intervention with focus on functional transfers, BADL training, toileting, activity tolerance, discharge planning, and safety awareness. Stand pivot transfers with supervisoin; sliding board transfers with supervision after board setup, BADLs with supervision/CGA. Pt stood to urinate using urinal with CGA for standing. Slide board tranfsers to recliner in ADL apartment.  Pt pleased with progress and ready for discharge tomorrow.   Therapy Documentation Precautions:  Precautions Precautions: Fall Precaution Comments: monitor HR, sats and BP Restrictions Weight Bearing Restrictions: Yes LLE Weight Bearing: Non weight bearing Other Position/Activity Restrictions: L AKA - in ace wrap.  no drainage seen through wrap Pain: Pt c/o R knee tenderness; repositioned  Therapy/Group: Individual Therapy  [name] 09/14/2019, 12:53 PM

## 2019-09-14 NOTE — Progress Notes (Signed)
Social Work Patient ID: Joseph Hoffman, male   DOB: [birth year], 68 y.o.   MRN: [id number]  Met with pt and spoke with wife to discuss discharge tomorrow. Both feel prepared and can't wait to go home tomorrow. Equipment to be delivered in am, son to be here at 10;00 to pick up equipment.

## 2019-09-14 NOTE — Progress Notes (Signed)
Physical Therapy Session Note  Patient Details  Name: Joseph Hoffman MRN: [id number] Date of Birth: 02/20/1951  Today's Date: 09/14/2019 PT Individual Time: 1300-1400 and [id number] PT Individual Time Calculation (min): 60 min and 30 min   Short Term Goals: Week 4:  PT Short Term Goal 1 (Week 4): STG=LTG due to ELOS.  Skilled Therapeutic Interventions/Progress Updates:     Session 1: Patient sitting EOB with RN in room upon PT arrival. Patient alert and agreeable to PT session. Patient reported a pop and pain in his R knee with therapy yesterday and 2-3 soreness in his knee this afternoon. RN stated that patient was pre-medicated prior to session. PT provided repositioning, rest breaks, and distraction as pain interventions throughout session. Patient's son, Laverna Peace, arrived at beginning of session to participate in family education during both sessions this afternoon pending patient's d/c tomorrow.   Therapeutic Activity: Bed Mobility: Patient performed rolling R/L supine to/from sit in the ADL bed independently.  Transfers: Patient performed sit to/from stand, stand pivot transfers, and toilet transfers with supervision for safety due to decreased balance using a RW throughout session. Patient utilized teach-back method to teach his son how to guard him and his correct hand placement. Patients son demonstrated safe guarding technique with transfers following PT's demonstration during session. Patient performed a car transfer with the slide board x1 with set up assist and supervision for safety due to decreased balance. He performed a furniture transfer to the recliner in the ADL apartment with supervision with a slide board with set up assist for the slide board x1. Provided cues for board placement, w/c set up, and safe guarding.   Gait Training:  Patient ambulated 3-4 feet during functional transfer using RW with close supervision for safety. Limited ambulation due to increased R knee pain  yesterday and some this afternoon. Ambulated with hop-to gait pattern on R and decreased R foot clearance. Provided verbal cues for increased use of UEs to clear R foot to reduce fall risk.  Wheelchair Mobility:  Patient propelled wheelchair >200 feet with mod I. Patient able to don/doff R leg rest, but prefers not to use it and use breaks appropriately throughout session without cues.   Patient in w/c with his son returning to his room in the hallway at end of session. Patient's son receptive to all education and demonstrated safe assistance with mobility throughout session. Discussed home safety and set up throughout and problem solved furniture transfers and hallway mobility.   Session 2: Patient sitting EOB upon PT arrival. Patient alert and agreeable to PT session, but reported dizziness in sitting for 5 min prior to PT arrival. BP 96/56 HR 81. Instructed patient to return to lying with supervision. Patient remained in supine x5 min with decreased, but not resolved dizziness, BP 101/57 HR 71. Patient returned to sitting with increased dizziness, BP 118/54 HR 76 and some mild confusion with word finding in sitting. Returned patient to supine. RN and PA made aware of patient vitals and symptoms during session.   Provided education to patient and his son on fall risk/prevention at home, activation of emergency services, home safety and w/c mobility, signs and symptoms of a heart attack and stroke, monitoring vitals and symptoms at home, energy conservation techniques, and providing supervision with all mobility at this time with minimal ambulation unless with HHPT. Patient and his son very receptive to all education.   Patient in bed with his son at bedside at end of session.  Therapy Documentation Precautions:  Precautions Precautions: Fall Precaution Comments: monitor HR, sats and BP Restrictions Weight Bearing Restrictions: Yes LLE Weight Bearing: Non weight bearing Other  Position/Activity Restrictions: L AKA - in ace wrap.  no drainage seen through wrap    Therapy/Group: Individual Therapy  Chassidy Layson L Mehak Roskelley PT, DPT  09/14/2019, 4:57 PM

## 2019-09-14 NOTE — Plan of Care (Signed)
  Problem: Consults Goal: RH LIMB LOSS PATIENT EDUCATION Description: Description: See Patient Education module for eduction specifics. Outcome: Progressing Goal: Skin Care Protocol Initiated - if Braden Score 18 or less Description: If consults are not indicated, leave blank or document N/A Outcome: Progressing   Problem: RH BOWEL ELIMINATION Goal: RH STG MANAGE BOWEL WITH ASSISTANCE Description: STG Manage Bowel with  mod I Assistance. Outcome: Progressing Goal: RH STG MANAGE BOWEL W/MEDICATION W/ASSISTANCE Description: STG Manage Bowel with Medication with mod I Assistance. Outcome: Progressing   Problem: RH BLADDER ELIMINATION Goal: RH STG MANAGE BLADDER WITH ASSISTANCE Description: STG Manage Bladder With min Assistance Outcome: Progressing Goal: RH STG MANAGE BLADDER WITH MEDICATION WITH ASSISTANCE Description: STG Manage Bladder With Medication With  mod I Assistance. Outcome: Progressing   Problem: RH SKIN INTEGRITY Goal: RH STG SKIN FREE OF INFECTION/BREAKDOWN Description: With min assist Outcome: Progressing Goal: RH STG MAINTAIN SKIN INTEGRITY WITH ASSISTANCE Description: STG Maintain Skin Integrity With  min Assistance. Outcome: Progressing Goal: RH STG ABLE TO PERFORM INCISION/WOUND CARE W/ASSISTANCE Description: STG Able To Perform Incision/Wound Care With min Assistance. Outcome: Progressing   Problem: RH SAFETY Goal: RH STG ADHERE TO SAFETY PRECAUTIONS W/ASSISTANCE/DEVICE Description: STG Adhere to Safety Precautions With cues/reminders Assistance/Device. Outcome: Progressing   Problem: RH PAIN MANAGEMENT Goal: RH STG PAIN MANAGED AT OR BELOW PT'S PAIN GOAL Description: Pain level less than 4 on scale of 0-10 Outcome: Progressing   Problem: RH KNOWLEDGE DEFICIT LIMB LOSS Goal: RH STG INCREASE KNOWLEDGE OF SELF CARE AFTER LIMB LOSS Description: Pt will be able to demonstrate dressing change to residual limb with min assist.  Pt will be able to  demonstrate importance of adherence to medication regimen to control diabetes, hypertension and manage cardiac issues independently using handouts and booklet provided.  Outcome: Progressing

## 2019-09-14 NOTE — Progress Notes (Signed)
ANTICOAGULATION CONSULT NOTE  Pharmacy Consult for warfarin Indication: Mechanical AVR  Patient Measurements: Height: 5\' 11"  (180.3 cm) Weight: 222 lb 10.6 oz (101 kg) IBW/kg (Calculated) : 75.3  Vital Signs: Temp: 98.8 F (37.1 C) (10/12 1254) Temp Source: Oral (10/12 0352) BP: 114/78 (10/12 1254) Pulse Rate: 73 (10/12 1254)  Labs: Recent Labs    09/11/19 1449 09/12/19 0532 09/13/19 0903 09/14/19 0625  HGB  --   --   --  8.4*  HCT  --   --   --  27.1*  PLT  --   --   --  185  LABPROT  --  31.3* 28.0* 28.3*  INR  --  3.1* 2.7* 2.7*  CREATININE  --   --   --  1.60*  TROPONINIHS 24*  --   --   --      Assessment:  68 year old male on warfarin for mechanical AVR who received INR reversal (Vitamin K 5mg  IV) on 9/9 for an AKA. Patient was on heparin as a bridge, warfarin was resumed per pharmacy dosing on 08/15/19.   INR remains therapeutic. No bleeding reported. PO intake has been stable.  PTA warfarin dose: 10 mg daily except 12.5 mg on Tues/Thurs/Sat.   Goal of Therapy:  INR 2.5-3.5 Monitor platelets by anticoagulation protocol: Yes   Plan:  Coumadin 10mg  PO daily at 1800 Daily PT / INR for now   Thank you for involving pharmacy in this patient's care.  Renold Genta, PharmD, BCPS Clinical Pharmacist Clinical phone for 09/14/2019 until 3p is [phone] 09/14/2019 1:05 PM  **Pharmacist phone directory can be found on [email or web address] listed under [place]**

## 2019-09-14 NOTE — Discharge Summary (Signed)
Physician Discharge Summary  Patient ID: Joseph Hoffman MRN: [id number] DOB/AGE: [birth year] 68 y.o.  Admit date: 08/17/2019 Discharge date: 09/15/2019  Discharge Diagnoses:  Principal Problem:   Above knee amputation of left lower extremity [facility]) Active Problems:   S/P AVR, 09/29/12, [employer]. (discharged 10/05/12)   Permanent atrial fibrillation, since 1994   Type 2 IDDM   Chronic anticoagulation   Essential hypertension   CRI (chronic renal insufficiency), stage 3 (moderate)   Severe aortic regurgitation   CAD, multiple vessel   Acute blood loss anemia   Labile blood glucose   Widened pulse pressure   Discharged Condition: stable   Significant Diagnostic Studies: Dg Chest 2 View  Result Date: 08/27/2019 CLINICAL DATA:  Fever. EXAM: CHEST - 2 VIEW COMPARISON:  08/03/2019 FINDINGS: Chronic cardiomegaly. Prosthetic aortic valve. Pulmonary vascularity is normal. Lungs are clear. No effusions. No significant bone abnormality. IMPRESSION: No acute abnormalities. Chronic cardiomegaly. Electronically Signed   By: Lorriane Shire M.D.   On: 08/27/2019 11:41   Ct Head Code Stroke Wo Contrast  Result Date: 08/18/2019 CLINICAL DATA:  Code stroke.  Aphasia and dysarthria. EXAM: CT HEAD WITHOUT CONTRAST TECHNIQUE: Contiguous axial images were obtained from the base of the skull through the vertex without intravenous contrast. COMPARISON:  08/27/2008 FINDINGS: Brain: Generalized atrophy. Mild chronic small-vessel ischemic changes of the deep white matter. Old small vessel infarction left basal ganglia. Old small vessel infarction right cerebellum. No sign of acute infarction, mass lesion, hemorrhage, hydrocephalus or extra-axial collection. Vascular: There is atherosclerotic calcification of the major vessels at the base of the brain. Skull: Normal Sinuses/Orbits: Clear/normal Other: None ASPECTS ([place] Stroke Program Early CT Score) - Ganglionic level infarction (caudate, lentiform nuclei,  internal capsule, insula, M1-M3 cortex): 7 - Supraganglionic infarction (M4-M6 cortex): 3 Total score (0-10 with 10 being normal): 10 IMPRESSION: 1. No acute finding by CT. Chronic small-vessel ischemic changes of the white matter, cerebellum and left basal ganglia. 2. ASPECTS is 10 3. These results were communicated to Dr. Lorraine Lax at 5:47 pmon 9/15/2020by text page via the [facility] messaging system. Electronically Signed   By: Nelson Chimes M.D.   On: 08/18/2019 17:49    Labs:  Basic Metabolic Panel: BMP Latest Ref Rng & Units 09/14/2019 09/11/2019 09/07/2019  Glucose 70 - 99 mg/dL 81 116(H) 186(H)  BUN 8 - 23 mg/dL 49(H) 38(H) 46(H)  Creatinine 0.61 - 1.24 mg/dL 1.60(H) 1.42(H) 1.70(H)  BUN/Creat Ratio 10 - 24 - - -  Sodium 135 - 145 mmol/L 137 136 136  Potassium 3.5 - 5.1 mmol/L 4.0 4.2 4.4  Chloride 98 - 111 mmol/L 101 102 104  CO2 22 - 32 mmol/L 24 23 23   Calcium 8.9 - 10.3 mg/dL 9.3 9.2 9.4    CBC: CBC Latest Ref Rng & Units 09/14/2019 09/11/2019 09/07/2019  WBC 4.0 - 10.5 K/uL 8.5 10.9(H) 9.6  Hemoglobin 13.0 - 17.0 g/dL 8.4(L) 8.9(L) 9.2(L)  Hematocrit 39.0 - 52.0 % 27.1(L) 27.9(L) 28.5(L)  Platelets 150 - 400 K/uL 185 213 230    CBG: Recent Labs  Lab 09/14/19 0624 09/14/19 1131 09/14/19 1624 09/14/19 2058 09/15/19 0606  GLUCAP 75 125* 134* 118* 62*    Brief HPI:   Joseph Hoffman is a 68 y.o. male with history of T2DM, CAD, CAF, mechanical AVR, OSA who was originally admitted on 07/31/19 with poorly healing left great toe amputation site his hospital course was complicated by issues with lethargy and confusion due to narcotics as well as issues  with hypotension with AKI and urinary retention requiring Foley placement.  As medical issues stabilize he was admitted to CIR on 08/05/19 for intensive rehab program.  He continued to have persistent leukocytosis without signs of infection.  Foley was discontinued and voiding trial initiated.  He had difficulty with voiding with PVRs up to  800 to 1000 cc requiring in and out caths.  Weights were stable without signs of overload.   He did have increasing pain and develop ischemia around BKA wound edges.  Dr. Carlis Abbott was consulted for input and recommended surgical intervention.  Patient elected to undergo left AKA for definitive treatment and was discharged to acute floor for said surgery on 08/13/2019.  Postop was reported to have improvement in pain control however continued to have serosanguineous drainage from amputation site.  He was maintained on IV heparin Coumadin bridge as INR subtherapeutic.  Therapies were resumed and patient was noted to have ongoing limitations in mobility as well as ability to carry out ADLs.  He was cleared to resume CIR program on 08/17/19   Hospital Course: Joseph Hoffman was admitted to rehab 08/17/2019 for inpatient therapies to consist of PT and OT at least three hours five days a week. Past admission physiatrist, therapy team and rehab RN have worked together to provide customized collaborative inpatient rehab.  Weights have been checked daily with close monitoring for signs of fluid overload.  He was maintained on 1800 cc fluid restriction.  Renal status has been monitored with serial checks and is currently at baseline.  Follow-up CBC showed acute blood loss anemia and as patient was symptomatic, he was transfused with 1 unit PRBC on 09/16. His endurance has steadily improved. Serial  CBC shows that leukocytosis is resolved and H&H is stable overall.   Pharmacy has been assisting with management and dosing of Coumadin.  INR is therapeutic at discharge and he is to continue on 10 mg Coumadin daily.  Home health RN to draw CBC and INR on 10/15 with results to the bowel Coumadin clinic He initially had issues with activity tolerance due to cardiac discomfort as well as bradycardia.  Cardiology was consulted for input and recommended discontinuation of digoxin as well as decrease in metoprolol dosing.  His  therapy schedule has been adjusted to prevent cardiac symptoms and by time of discharge he is able to tolerate intensive therapy without any discomfort Blood pressures were monitored on TID basis and is currently controlled without orthostatic symptoms.  His diabetes has been monitored with ac/hs CBG checks and SSI was use prn for tighter BS control.  Victoza and Amaryl were resumed for tighter blood sugar control.  As mobility improved he started having issues with hypoglycemia.  Lantus has adjusted multiple times and was decreased to 38 units bid. His voiding function has been monitored with PVR checks and he is currently voiding without difficulty.  Urecholine was tapered to 10 mg tid daily and he continues on Flomax at this time.  He was started on bowel program to help manage constipation.  As constipation resolved he started having loose stools therefore laxatives were changed to PRN basis.   Post admission, he had issues with lethargy and confusion due to excessive narcotic use and acute toxic metabolic encephalopathy. Ct head negative for acute changes and his pain regimen has been adjusted to tramadol 3 times daily with oxycodone being used on a limited basis.  Flexeril was scheduled to tid with good results. Left AKA site has been healing  well.  He did develop some drainage from medial aspect due to localized seroma.  This was expressed and he was started on doxycycline x1 week for wound prophylaxis.  Staples were removed without difficulty on 10/10 and he continues to have minimal amount of serosanguineous drainage from medial aspect.  Wound is intact without signs of infection.  He is able to Ace wrap wound independently and has been educated on importance of monitoring for any pressure areas from excessive compression. He is continent of bowel and bladder. Anxiety levels have greatly improved and mood is stable. He has made gains during rehab stay and is currently at supervision at wheelchair  level. He will continue to receive follow up [place], [place] and [place] by [facility]  after discharge  Rehab course: During patient's stay in rehab weekly team conferences were held to monitor patient's progress, set goals and discuss barriers to discharge. At admission, patient required max assist with mobility and with ADL tasks.  He has had improvement in activity tolerance, balance, postural control as well as ability to compensate for deficits. He is able to perform stand pivot and sliding board transfers with supervision.  He is able to ambulate up to 25 feet with verbal cues and supervision.  He does require cues to increase right foot clearance and for gait pattern.  He is able to navigate ramp with contact-guard assist.  He requires contact-guard assist for standing balance for lower body bathing and clothing management.  Family was not available for education during OT sessions however patient is able to direct care and asked for assistance when necessary.  Son has been educated regarding assistance and safety with mobility.   Disposition: Home  Diet: Heart healthy/carb modified.  Special Instructions: 1. Monitor BS ac/hs and follow up with PCP for further adjustment in medications. 2. Continue 1800 cc FR. Increase fluid intake by 300 cc/day if dizzy or orthostatic. 3. HHRN to draw CBC/INR on 10/15 with results to [facility] coumadin clinic.     Discharge Instructions    Ambulatory referral to Physical Medicine Rehab   Complete by: As directed    1-2 weeks transitional care appt     Allergies as of 09/15/2019      Reactions   Iohexol Anaphylaxis   Niacin And Related    Flushing with immediate realese   Penicillins Other (See Comments)   Unknown.[name] Did it involve swelling of the face/tongue/throat, SOB, or low BP? Unknown Did it involve sudden or severe rash/hives, skin peeling, or any reaction on the inside of your mouth or nose? Unknown Did you need to seek  medical attention at a hospital or doctor's office? Unknown When did it last happen?Childhood If all above answers are "NO", may proceed with cephalosporin use.      Medication List    STOP taking these medications   digoxin 0.125 MG tablet Commonly known as: LANOXIN   gabapentin 300 MG capsule Commonly known as: NEURONTIN   heparin 25000-0.45 UT/250ML-% infusion   oxyCODONE-acetaminophen 10-325 MG tablet Commonly known as: Percocet     TAKE these medications   acetaminophen 325 MG tablet Commonly known as: TYLENOL Take 1-2 tablets (325-650 mg total) by mouth every 4 (four) hours as needed for mild pain. What changed:   how much to take  reasons to take this   albuterol 108 (90 Base) MCG/ACT inhaler Commonly known as: VENTOLIN HFA Inhale 2 puffs into the lungs every 6 (six) hours as needed for  wheezing or shortness of breath.   B-complex with vitamin C tablet Take 1 tablet by mouth daily.   B-D ULTRAFINE III SHORT PEN 31G X 8 MM Misc Generic drug: Insulin Pen Needle   bethanechol 10 MG tablet Commonly known as: URECHOLINE Take 1 tablet (10 mg total) by mouth 3 (three) times daily.   cholecalciferol 1000 units tablet Commonly known as: VITAMIN D Take 1,000 Units by mouth daily with breakfast.   clonazePAM 0.25 MG disintegrating tablet Commonly known as: KLONOPIN Take 1 tablet (0.25 mg total) by mouth 2 (two) times daily as needed (tremors/anxiety). Notes to patient: For anxiety or tremors   cyclobenzaprine 5 MG tablet Commonly known as: FLEXERIL Take 1 tablet (5 mg total) by mouth 3 (three) times daily. Notes to patient: For muscle spasms   doxycycline 100 MG tablet Commonly known as: VIBRA-TABS Take 1 tablet (100 mg total) by mouth every 12 (twelve) hours. Notes to patient: Use till gone--to protect the incision.    Easy Touch Lancets 30G/Twist Misc   escitalopram 10 MG tablet Commonly known as: LEXAPRO Take 10 mg by mouth at bedtime.    ezetimibe 10 MG tablet Commonly known as: ZETIA Take 1 tablet (10 mg total) by mouth daily.   fenofibrate 160 MG tablet Take 1 tablet (160 mg total) by mouth daily. What changed: when to take this   Fluticasone-Salmeterol 250-50 MCG/DOSE Aepb Commonly known as: ADVAIR Inhale 1 puff into the lungs 2 (two) times daily as needed (shortness of breath/asthma related symptoms). Notes to patient: You need to use it twice a day.    glimepiride 2 MG tablet Commonly known as: AMARYL Take 1 tablet (2 mg total) by mouth daily with breakfast. Start taking on: September 16, 2019   hydrocortisone cream 1 % Apply topically 3 (three) times daily. Notes to patient: For dry skin   Icosapent Ethyl 1 g Caps Commonly known as: Vascepa Take 2 capsules (2 g total) by mouth 2 (two) times daily. What changed: when to take this   insulin glargine 100 UNIT/ML injection Commonly known as: LANTUS Inject 0.38 mLs (38 Units total) into the skin 2 (two) times daily. I What changed:   how much to take  when to take this  additional instructions   liraglutide 18 MG/3ML Sopn Commonly known as: VICTOZA Inject 1.8 mg into the skin at bedtime.   metoprolol succinate 25 MG 24 hr tablet Commonly known as: Toprol XL Take 0.5 tablets (12.5 mg total) by mouth daily. What changed:   how much to take  when to take this   nitroGLYCERIN 0.4 MG SL tablet Commonly known as: NITROSTAT Place 1 tablet (0.4 mg total) under the tongue every 5 (five) minutes as needed for chest pain. What changed: when to take this   oxyCODONE 5 MG immediate release tablet--Rx # 28 pills Commonly known as: Oxy IR/ROXICODONE Take 1-2 tablets (5-10 mg total) by mouth 2 (two) times daily as needed for severe pain or breakthrough pain.   pantoprazole 40 MG tablet Commonly known as: PROTONIX Take 1 tablet (40 mg total) by mouth daily. Start taking on: September 16, 2019   saccharomyces boulardii 250 MG capsule Commonly known as:  FLORASTOR Take 250 mg by mouth 3 (three) times a week.   sacubitril-valsartan 24-26 MG Commonly known as: ENTRESTO Take 1 tablet by mouth 2 (two) times daily.   tamsulosin 0.4 MG Caps capsule Commonly known as: FLOMAX Take 1 capsule (0.4 mg total) by mouth daily after supper.  torsemide 20 MG tablet Commonly known as: DEMADEX Take 2 tablets (40 mg total) by mouth every morning. What changed:   how much to take  when to take this  additional instructions   traMADol 50 MG tablet--Rx # 28 pills Commonly known as: ULTRAM Take 1 tablet (50 mg total) by mouth every 6 (six) hours as needed for moderate pain.   vitamin C 500 MG tablet Commonly known as: ASCORBIC ACID Take 500 mg by mouth 2 (two) times daily.   warfarin 5 MG tablet Commonly known as: COUMADIN Take as directed. If you are unsure how to take this medication, talk to your nurse or doctor. Original instructions: Take 2 tablets (10 mg total) by mouth daily at 6 PM. What changed:   medication strength  how much to take  when to take this      Follow-up Information    Lovorn, Jinny Blossom, MD Follow up.   Specialty: Physical Medicine and Rehabilitation Why: Office will call you with follow up appointment Contact information: [id number] [place] [address] [phone]        Marty Heck, MD Follow up on 09/29/2019.   Specialty: Vascular Surgery Why: Be there at 8:20 am for post op appointment- Contact information: [address] [place] [address] [phone]        Orpah Melter, MD. Call.   Specialty: Family Medicine Why: for post hospital follow up Contact information: [address] [place] [address] [phone]        Troy Sine, MD .   Specialty: Cardiology Contact information: [address] [place] [address] [phone]           Signed: Bary Leriche 09/15/2019, 11:21 AM

## 2019-09-14 NOTE — Progress Notes (Addendum)
[facility] PHYSICAL MEDICINE & REHABILITATION PROGRESS NOTE   Subjective/Complaints:  Pt reports doing great- ready to go home tomorrow- BUN up to 49 likely from not drinking enough- will encourage to drink, so no IVFs day prior to d/c.    ROS: Denies CP, SOB at rest, N/V/D  Objective:   No results found. Recent Labs    09/11/19 1301 09/14/19 0625  WBC 10.9* 8.5  HGB 8.9* 8.4*  HCT 27.9* 27.1*  PLT 213 185   Recent Labs    09/11/19 1301 09/14/19 0625  NA 136 137  K 4.2 4.0  CL 102 101  CO2 23 24  GLUCOSE 116* 81  BUN 38* 49*  CREATININE 1.42* 1.60*  CALCIUM 9.2 9.3    Intake/Output Summary (Last 24 hours) at 09/14/2019 1125 Last data filed at 09/14/2019 1029 Gross per 24 hour  Intake 1240 ml  Output 1500 ml  Net -260 ml     Physical Exam: Vital Signs Blood pressure (!) 122/48, pulse 77, temperature 98 F (36.7 C), temperature source Oral, resp. rate 16, height 5\' 11"  (1.803 m), weight 101 kg, SpO2 99 %. Constitutional: No distress . Vital signs and labs reviewed. Sitting up in manual w/c;grooming/bathing at sink- using towel to cover self, NAD HENT: Normocephalic.  Atraumatic. Eyes: EOMI. Conjugate gaze Cardiovascular:  Irregularly irregular. +Click. Respiratory: Normal effort.  No stridor. GI: Non-distended. Skin: Left AKA well wrapped with ACE wrap- C/D/I ; staples should be out Psych: Normal mood.  Normal behavior. Musc:no chest wall or rib tenderness no neck pain to palp  Neurological: Alert Motor: Bilateral upper extremities: 5/5 proximal distal Right lower extremity: 4/5 proximal distal  Left lower extremity: 4+/5 (pain inhibition)    Assessment/Plan: 1. Functional deficits secondary to new L AKA due to ischemia/gangrene which require 3+ hours per day of interdisciplinary therapy in a comprehensive inpatient rehab setting.  Physiatrist is providing close team supervision and 24 hour management of active medical problems listed  below.  Physiatrist and rehab team continue to assess barriers to discharge/monitor patient progress toward functional and medical goals  Care Tool:  Bathing    Body parts bathed by patient: Right arm, Left arm, Chest, Abdomen, Front perineal area, Right upper leg, Right lower leg, Face, Buttocks, Left upper leg   Body parts bathed by helper: Buttocks, Right lower leg Body parts n/a: Left lower leg   Bathing assist Assist Level: Minimal Assistance - Patient > 75%     Upper Body Dressing/Undressing Upper body dressing   What is the patient wearing?: Pull over shirt    Upper body assist Assist Level: Independent    Lower Body Dressing/Undressing Lower body dressing      What is the patient wearing?: Pants, Underwear/pull up     Lower body assist Assist for lower body dressing: Contact Guard/Touching assist     Toileting Toileting    Toileting assist Assist for toileting: Contact Guard/Touching assist Assistive Device Comment: bedpan   Transfers Chair/bed transfer  Transfers assist  Chair/bed transfer activity did not occur: Safety/medical concerns  Chair/bed transfer assist level: Contact Guard/Touching assist Chair/bed transfer assistive device: Programmer, multimedia   Ambulation assist   Ambulation activity did not occur: Safety/medical concerns(labial BP and HR sitting EOB.)  Assist level: Contact Guard/Touching assist Assistive device: Walker-rolling Max distance: 6'   Walk 10 feet activity   Assist  Walk 10 feet activity did not occur: Safety/medical concerns(labial BP and HR sitting EOB.)  Assist level: Contact Guard/Touching  assist Assistive device: Walker-rolling   Walk 50 feet activity   Assist Walk 50 feet with 2 turns activity did not occur: Safety/medical concerns(labial BP and HR sitting EOB.)         Walk 150 feet activity   Assist Walk 150 feet activity did not occur: Safety/medical concerns(labial BP and HR sitting  EOB.)         Walk 10 feet on uneven surface  activity   Assist Walk 10 feet on uneven surfaces activity did not occur: Safety/medical concerns(labial BP and HR sitting EOB.)         Wheelchair     Assist Will patient use wheelchair at discharge?: Yes Type of Wheelchair: Manual Wheelchair activity did not occur: Safety/medical concerns(labial BP and HR sitting EOB.)  Wheelchair assist level: Independent Max wheelchair distance: 150'    Wheelchair 50 feet with 2 turns activity    Assist    Wheelchair 50 feet with 2 turns activity did not occur: Safety/medical concerns(labial BP and HR sitting EOB.)   Assist Level: Independent   Wheelchair 150 feet activity     Assist  Wheelchair 150 feet activity did not occur: Safety/medical concerns(labial BP and HR sitting EOB.)   Assist Level: Independent   Blood pressure (!) 122/48, pulse 77, temperature 98 F (36.7 C), temperature source Oral, resp. rate 16, height 5\' 11"  (1.803 m), weight 101 kg, SpO2 99 %.  Medical Problem List and Plan: 1.Fxnl and mobility deficitssecondary to PAD causing left AKA  Cont CIR PT, OT, SLP  2.Mechanical AVR/Antithrombotics: -DVT/anticoagulation:Pharmaceutical:Coumadin    INR therapeutic 2.7  on 10/12 -antiplatelet therapy: N/A 3. Pain Management:  Scheduled low dose flexeril tid to help with spasms.  May need neurontin titrate upwarrds. Scheduled tramadol 50 mg TID and will use oxy only when needs it.  Relatively controlled on 10/5 4. Mood:LCSW to follow for evaluation and support. -antipsychotic agents: N/A 5. Neuropsych: This patientiscapable of making decisions on hisown behalf. 6. Skin/Wound Care:Monitor wound daily. Monitor stump for signs and symptoms of infection  Daily dressings, drainage decreased  9/28- changed ACE wrap to TID per surgeon; ordered retention sock and shrinker for after d/c.  10/2-  will con't retention sock and ACE wrap until 101/0- they will remove staples 10/10 per Dr Ainsley Spinner office  10/6- mild drainage- mainly serous- reeducated nursing to rewrap ACE 3-4x/day for edema control  10/8- can d/c staples Saturday per vascular- starting keflex, just in case per vascular Staples removed 10/10  10/12- pt knows how to do ACE wrap 7. Fluids/Electrolytes/Nutrition:Monitor I/Os 8. T2DM: Monitor BS ac/hs. Continue Lantus 40 mg bid with Victoza  Resumed amaryl 2mg   CBG (last 3)  Recent Labs    09/13/19 1645 09/13/19 2056 09/14/19 0624  GLUCAP 115* 150* 75   10/12 controlled  10/12-will decrease to 38 units per nursing request- since hypoglycemia occ-  9. CAD/CAF: Monitor for presyncope/syncope as back on Entresto.   9/21- Cards- decr Metoprolol and Digoxin- pt feeling much better   9/25- Digoxin stopped per Cardiology  EKG showing Afib  10 Chronic systolic CHF: Heart healthy diet and monitor for signs of overload. Check weight daily. On Entresto, Demadex, metoprolol and fenofibrate, Vascepa.  Troponin 24 c/w CHF    Filed Weights   09/11/19 0500 09/12/19 0450 09/14/19 0352  Weight: 100.3 kg 100.4 kg 101 kg   Weights slightly up 1/2 kilo 11. Urinary retention:   PVRs elevated  Continue Flomax  9/23- voiding "all night" per pt- which was  new- didn't require cath  9/26 voiding continently  10/3- cannot void this AM- will monitor if needs caths again.  10/4- voiding well again-  12. Constipation:Start bowel program as this has been an issues since first surgery.   9/26--bm this morning  10/3- denies issues 13.  Acute blood loss anemia  Transfused 1 unit PRBC on 9/16  Hemoglobin 9.2 on 10/5  Continue to monitor 13. Hyponatremia:   Sodium 136 on 10/5  Continue to monitor 14. OSA: CPAP when sleeping with 2 L bleed in.  15. Acute on chronic renal failure:   Creatinine 1.70 on 10/5  Encourage fluids  Continue to monitor  10/12- BUN 49 and Cr 1.60- just push  fluids- at baseline. 16.  Transaminitis: Resolved  Continue to monitor 17. Chest pain - likley stable angina, resolved troponin 24 c/w CHF +/- stable angina , EKG showing AFIB which is chronic  Does not appear GI or MSK    LOS: 28 days A FACE TO FACE EVALUATION WAS PERFORMED  Khalon Cansler 09/14/2019, 11:25 AM

## 2019-09-14 NOTE — Progress Notes (Signed)
Physical Therapy Discharge Summary  Patient Details  Name: Joseph Hoffman MRN: [id number] Date of Birth: 03-16-1951   Patient has met 8 of 9 long term goals due to improved activity tolerance, improved balance, improved postural control, increased strength, increased range of motion, decreased pain and ability to compensate for deficits.  Patient to discharge at a wheelchair level Supervision.   Patient's care partner is independent to provide the necessary physical assistance at discharge.  Reasons goals not met: Patient did not meet stair goal and has opted for putting a ramp in to enter his home at this time. Patient has met his ambulation goal with therapy, however, not in the past week due to limitations of fatigue and R knee pain. Patient was educated on remaining wheelchair level, using the RW for stand pivot transfers only. Simulated this with all transfer situations patient will encounter at home and patient will be able to functionally perform all needs without ambulation, and will progress with ambulation with HHPT. Patient in agreement.  Recommendation:  Patient will benefit from ongoing skilled PT services in home health setting to continue to advance safe functional mobility, address ongoing impairments in balance, strength, activity tolerance, ROM, functional mobility, gait training, patient/caregiver education, and minimize fall risk.  Equipment: Provided light weight 18"x18" wheelchair with standard cushion, slide board, and patient already owns RW (none provided)  Reasons for discharge: treatment goals met  Patient/family agrees with progress made and goals achieved: Yes  PT Discharge Precautions/Restrictions Precautions Precautions: Fall Restrictions Weight Bearing Restrictions: Yes LLE Weight Bearing: Non weight bearing Vision/Perception  Perception Perception: Within Functional Limits Praxis Praxis: Intact  Cognition Overall Cognitive Status: Within Functional  Limits for tasks assessed Arousal/Alertness: Awake/alert Orientation Level: Oriented X4 Attention: Focused;Sustained;Selective Focused Attention: Appears intact Sustained Attention: Appears intact Selective Attention: Appears intact Memory: Appears intact Immediate Memory Recall: Sock;Blue;Bed Memory Recall Sock: Without Cue Memory Recall Blue: Without Cue Memory Recall Bed: Without Cue Awareness: Appears intact Problem Solving: Appears intact Safety/Judgment: Appears intact Sensation Sensation Light Touch: Appears Intact Proprioception: Appears Intact Stereognosis: Appears Intact Coordination Gross Motor Movements are Fluid and Coordinated: No Fine Motor Movements are Fluid and Coordinated: Yes Coordination and Movement Description: L AKA with generalized weakness/deconditioning with major improvements with functional mobility since admission Motor  Motor Motor: Other (comment) Motor - Skilled Clinical Observations: L AKA with generalized weakness/deconditioning, with major improvements with functional mobility since admission.  Mobility Bed Mobility Bed Mobility: Rolling Right;Rolling Left;Supine to Sit;Sit to Supine Rolling Right: Independent Rolling Left: Independent Supine to Sit: Independent Sit to Supine: Independent Transfers Transfers: Sit to Stand;Stand to [employer];Lateral/Scoot Transfers Sit to Stand: Supervision/Verbal cueing Stand to Sit: Supervision/Verbal cueing Stand Pivot Transfers: Supervision/Verbal cueing Stand Pivot Transfer Details (indicate cue type and reason): Patient does not require cues, supervision is for safety due to decreased balance and intermittent dizziness with standing. Lateral/Scoot Transfers: Supervision/Verbal cueing;Set up assist(with slide board) Transfer (Assistive device): Rolling walker Locomotion  Gait Ambulation: Yes Gait Assistance: Supervision/Verbal cueing Gait Distance (Feet): 25 Feet(has abulated 7-25  feet with therapy, limited ambulation to 3-4 feet on grad day due to R knee pain) Assistive device: Rolling walker Gait Assistance Details: Verbal cues for gait pattern;Verbal cues for technique Gait Assistance Details: Cues for increased R foot clearance and decreased impact at initial contact. Gait Gait: Yes Gait Pattern: Impaired Gait Pattern: Right foot flat;Trunk flexed;Decreased step length - right;Poor foot clearance - right(Hop-to giat pattern on R) Gait velocity: significantly decreased Stairs / Additional Locomotion  Stairs: No Ramp: Contact Guard/touching assist(with w/c) Product manager Mobility: Yes Wheelchair Assistance: Independent with Camera operator: Both upper extremities;Right lower extremity Wheelchair Parts Management: Independent Distance: >200 feet  Trunk/Postural Assessment  Cervical Assessment Cervical Assessment: Within Functional Limits Thoracic Assessment Thoracic Assessment: Within Functional Limits Lumbar Assessment Lumbar Assessment: Within Functional Limits Postural Control Postural Control: Deficits on evaluation(decrease/delayed, but improving)  Balance Balance Balance Assessed: Yes Static Sitting Balance Static Sitting - Balance Support: Bilateral upper extremity supported Static Sitting - Level of Assistance: 6: Modified independent (Device/Increase time) Dynamic Sitting Balance Dynamic Sitting - Balance Support: During functional activity Dynamic Sitting - Level of Assistance: 6: Modified independent (Device/Increase time) Dynamic Sitting - Balance Activities: Lateral lean/weight shifting;Forward lean/weight shifting Static Standing Balance Static Standing - Balance Support: Right upper extremity supported;Left upper extremity supported;During functional activity Static Standing - Level of Assistance: 5: Stand by assistance Dynamic Standing Balance Dynamic Standing - Balance Support: Right upper  extremity supported;Left upper extremity supported;During functional activity Dynamic Standing - Level of Assistance: 5: Stand by assistance Dynamic Standing - Balance Activities: Lateral lean/weight shifting;Forward lean/weight shifting;Reaching for objects Dynamic Standing - Comments: during toileting for LB dressing Extremity Assessment  RUE Assessment RUE Assessment: Within Functional Limits Active Range of Motion (AROM) Comments: WFL for all functional mobility General Strength Comments: WFL for all functional mobility LUE Assessment LUE Assessment: Within Functional Limits Active Range of Motion (AROM) Comments: WFL for all functional mobility General Strength Comments: WFL for all functional mobility RLE Assessment RLE Assessment: Within Functional Limits Active Range of Motion (AROM) Comments: WFL General Strength Comments: Grossly in sitting 5/5 throughout LLE Assessment LLE Assessment: Exceptions to [facility] Active Range of Motion (AROM) Comments: Hip flexion to at least 100 deg and hip extension to neutral in supine, continues to be limited in pron lying due to poor hip extension General Strength Comments: Grossly in sitting: hip flexion/abdcution/adduction 4/5    Lakyn Mantione L Isidor Bromell PT, DPT  09/14/2019, 4:56 PM

## 2019-09-14 NOTE — Progress Notes (Signed)
Occupational Therapy Discharge Summary  Patient Details  Name: Joseph Hoffman MRN: [id number] Date of Birth: 1951/01/02  Patient has met 9 of 9 long term goals due to improved activity tolerance, improved balance, postural control, ability to compensate for deficits, improved awareness and improved coordination. Pt made steady progress with BADLs, functional transfers, and standing balance during this admission.  Pt performs all functional transfers (stand pivot and slide board) with supervison and requires CGA for standing balance for LB bathing and LB clothing management. Pt is able to properly Ace wrap RLE when needed. Family has not been in for OT sessions/educaiton. Pt is independent with directing care and requested assistance when necessary.   Patient to discharge at overall Supervision level.  Patient's care partner is independent to provide the necessary physical assistance at discharge.     Recommendation:  Patient will benefit from ongoing skilled OT services in home health setting to continue to advance functional skills in the area of BADL.  Equipment: drop arm BSC, pt purchasing TTB  Reasons for discharge: treatment goals met and discharge from hospital  Patient/family agrees with progress made and goals achieved: Yes  OT Discharge Vision Baseline Vision/History: Wears glasses Wears Glasses: At all times Patient Visual Report: No change from baseline Vision Assessment?: No apparent visual deficits Perception  Perception: Within Functional Limits Praxis Praxis: Intact Cognition Overall Cognitive Status: Within Functional Limits for tasks assessed Arousal/Alertness: Awake/alert Orientation Level: Oriented X4 Attention: Focused;Sustained;Selective Focused Attention: Appears intact Sustained Attention: Appears intact Selective Attention: Appears intact Memory: Appears intact Immediate Memory Recall: Sock;Blue;Bed Memory Recall Sock: Without Cue Memory Recall Blue:  Without Cue Memory Recall Bed: Without Cue Awareness: Appears intact Problem Solving: Appears intact Sensation Sensation Light Touch: Appears Intact Proprioception: Appears Intact Stereognosis: Appears Intact Motor  Motor Motor - Skilled Clinical Observations: L AKA with generalized weakness/deconditioning Trunk/Postural Assessment  Cervical Assessment Cervical Assessment: Within Functional Limits Thoracic Assessment Thoracic Assessment: Within Functional Limits Lumbar Assessment Lumbar Assessment: Within Functional Limits  Balance Static Sitting Balance Static Sitting - Balance Support: Bilateral upper extremity supported Static Sitting - Level of Assistance: 6: Modified independent (Device/Increase time) Dynamic Sitting Balance Dynamic Sitting - Balance Support: During functional activity Dynamic Sitting - Level of Assistance: 6: Modified independent (Device/Increase time) Extremity/Trunk Assessment RUE Assessment RUE Assessment: Within Functional Limits LUE Assessment LUE Assessment: Within Functional Limits   [name] 09/14/2019, 1:01 PM

## 2019-09-14 NOTE — Progress Notes (Signed)
Social Work Discharge Note   The overall goal for the admission was met for:   Discharge location: [place]  Length of Stay: Yes-28 DAYS  Discharge activity level: Yes-SUPERVISION WHEELCHAIR LEVEL  Home/community participation: Yes  Services provided included: MD, RD, PT, OT, RN, CM, TR, Pharmacy, Neuropsych and SW  Financial Services: Medicare and Private Insurance: [place]  Follow-up services arranged: [place], WIDE DROP-ARM BEDSIDE COMMODE AND [address] [place]. ACTIVE WITH [facility] PRIOR TO ADMISSION.  Comments (or additional information):SON COMING FROM [place] TO ASSIST BOTH. WIFE TO HAVE TKR 10/24. WILL GET TUB BENCH ON OWN. EDUCATION COMPLETE.    Patient/Family verbalized understanding of follow-up arrangements: Yes  Individual responsible for coordination of the follow-up plan: [place]  Confirmed correct DME delivered: Elease Hashimoto 09/14/2019    Elease Hashimoto

## 2019-09-14 NOTE — Progress Notes (Signed)
RT placed patient on CPAP with 3L O2 bled into circuit.  Patient tolerating CPAP well at this time. No respiratory distress noted. RT will monitor as needed.

## 2019-09-15 ENCOUNTER — Other Ambulatory Visit: Payer: Self-pay

## 2019-09-15 LAB — PROTIME-INR
INR: 2.6 — ABNORMAL HIGH (ref 0.8–1.2)
Prothrombin Time: 27.7 seconds — ABNORMAL HIGH (ref 11.4–15.2)

## 2019-09-15 LAB — GLUCOSE, CAPILLARY
Glucose-Capillary: 62 mg/dL — ABNORMAL LOW (ref 70–99)
Glucose-Capillary: 135 mg/dL — ABNORMAL HIGH (ref 70–99)

## 2019-09-15 MED ORDER — WARFARIN SODIUM 5 MG PO TABS: 10.0000 mg | ORAL_TABLET | Freq: Every day | ORAL | 0 refills | Status: AC

## 2019-09-15 MED ORDER — DOXYCYCLINE HYCLATE 100 MG PO TABS: 100.0000 mg | ORAL_TABLET | Freq: Two times a day (BID) | ORAL | 0 refills | Status: DC

## 2019-09-15 MED ORDER — INSULIN GLARGINE 100 UNIT/ML ~~LOC~~ SOLN: 38.0000 [IU] | Freq: Two times a day (BID) | SUBCUTANEOUS | 11 refills | Status: AC

## 2019-09-15 MED ORDER — BETHANECHOL CHLORIDE 10 MG PO TABS: 10.0000 mg | ORAL_TABLET | Freq: Three times a day (TID) | ORAL | 0 refills | Status: DC

## 2019-09-15 MED ORDER — TRAMADOL HCL 50 MG PO TABS: 50.0000 mg | ORAL_TABLET | Freq: Four times a day (QID) | ORAL | 0 refills | Status: DC | PRN

## 2019-09-15 MED ORDER — GLIMEPIRIDE 2 MG PO TABS: 2.0000 mg | ORAL_TABLET | Freq: Every day | ORAL | 0 refills | Status: AC

## 2019-09-15 MED ORDER — TAMSULOSIN HCL 0.4 MG PO CAPS: 0.4000 mg | ORAL_CAPSULE | Freq: Every day | ORAL | 0 refills | Status: DC

## 2019-09-15 MED ORDER — CYCLOBENZAPRINE HCL 5 MG PO TABS: 5.0000 mg | ORAL_TABLET | Freq: Three times a day (TID) | ORAL | 0 refills | Status: DC

## 2019-09-15 MED ORDER — OXYCODONE HCL 5 MG PO TABS: 5.0000 mg | ORAL_TABLET | Freq: Two times a day (BID) | ORAL | 0 refills | Status: DC | PRN

## 2019-09-15 MED ORDER — METOPROLOL SUCCINATE ER 25 MG PO TB24: 12.5000 mg | ORAL_TABLET | Freq: Every day | ORAL | 1 refills | Status: AC

## 2019-09-15 MED ORDER — PANTOPRAZOLE SODIUM 40 MG PO TBEC: 40.0000 mg | DELAYED_RELEASE_TABLET | Freq: Every day | ORAL | 0 refills | Status: DC

## 2019-09-15 MED ORDER — CLONAZEPAM 0.25 MG PO TBDP: 0.2500 mg | ORAL_TABLET | Freq: Two times a day (BID) | ORAL | 0 refills | Status: DC | PRN

## 2019-09-15 NOTE — Discharge Instructions (Signed)
Inpatient Rehab Discharge Instructions  Joseph Hoffman Discharge date and time:  09/15/19  Activities/Precautions/ Functional Status: Activity: no lifting, driving, or strenuous exercise  till cleared by MD Diet: cardiac diet and diabetic diet Wound Care: Cleanse incision with antibacterial soap and water (do not immerse it), pat dry and apply dry dressing. Keep wound clean and dry    Functional status:  ___ No restrictions     ___ Walk up steps independently _X__ 24/7 supervision/assistance   ___ Walk up steps with assistance ___ Intermittent supervision/assistance  ___ Bathe/dress independently ___ Walk with walker     ___ Bathe/dress with assistance ___ Walk Independently    ___ Shower independently ___ Walk with assistance    _X__ Shower with assistance _X__ No alcohol     ___ Return to work/school ________  Special Instructions: 1. Monitor blood sugars before meals and at bedtime. 2. Need to keep left leg extended out and use compressive dressing daily.     COMMUNITY REFERRALS UPON DISCHARGE:    Home Health:   PT, OT, RN   [place] [phone]   Date of last service:09/15/2019  Medical Equipment/Items Ordered:WHEELCHAIR, WIDE DROP-ARM BEDSIDE COMMODE & 24 TRANSFER BOARD  Agency/Supplier: [employer] [phone]  Other:WIFE GETTING PORTABLE RAMP WILL NOT BE INSTALLED UNTIL END OF WEEK TUB BENCH WIFE TO GET ON OWN    My questions have been answered and I understand these instructions. I will adhere to these goals and the provided educational materials after my discharge from the hospital.  Patient/Caregiver Signature _______________________________ Date __________  Clinician Signature _______________________________________ Date __________  Please bring this form and your medication list with you to all your follow-up doctor's appointments.

## 2019-09-15 NOTE — Progress Notes (Signed)
Patient discharged to home, accompanied and transported by his son.

## 2019-09-15 NOTE — Progress Notes (Signed)
Recorded CBG reading 62 po fluid and food provided , asymptomatic follow up

## 2019-09-15 NOTE — Consult Note (Signed)
   [facility] CM Inpatient Consult   09/15/2019  EANN ATKINSON Mar 09, 1951 [id number]   LLOS: post rehab  Assigned to General EMMI follow up calls.  Natividad Brood, RN BSN [facility] Liaison  [phone] business mobile phone Toll free office [phone]  Fax number: [phone] [name]

## 2019-09-17 ENCOUNTER — Other Ambulatory Visit: Payer: Self-pay

## 2019-09-17 ENCOUNTER — Emergency Department (HOSPITAL_COMMUNITY)
Admission: EM | Admit: 2019-09-17 | Discharge: 2019-09-17 | Disposition: A | Discharge status: Home / Self Care | Payer: Medicare Other | Attending: Emergency Medicine | Admitting: Emergency Medicine

## 2019-09-17 ENCOUNTER — Telehealth: Payer: Self-pay

## 2019-09-17 ENCOUNTER — Encounter (HOSPITAL_COMMUNITY): Payer: Self-pay

## 2019-09-17 DIAGNOSIS — Z794 Long term (current) use of insulin: Secondary | ICD-10-CM | POA: Diagnosis not present

## 2019-09-17 DIAGNOSIS — R339 Retention of urine, unspecified: Secondary | ICD-10-CM | POA: Diagnosis not present

## 2019-09-17 DIAGNOSIS — Z7901 Long term (current) use of anticoagulants: Secondary | ICD-10-CM | POA: Diagnosis not present

## 2019-09-17 DIAGNOSIS — I959 Hypotension, unspecified: Secondary | ICD-10-CM | POA: Diagnosis not present

## 2019-09-17 DIAGNOSIS — I428 Other cardiomyopathies: Secondary | ICD-10-CM | POA: Diagnosis not present

## 2019-09-17 DIAGNOSIS — Z89612 Acquired absence of left leg above knee: Secondary | ICD-10-CM | POA: Diagnosis not present

## 2019-09-17 DIAGNOSIS — J9611 Chronic respiratory failure with hypoxia: Secondary | ICD-10-CM | POA: Diagnosis not present

## 2019-09-17 DIAGNOSIS — I5042 Chronic combined systolic (congestive) and diastolic (congestive) heart failure: Secondary | ICD-10-CM | POA: Diagnosis not present

## 2019-09-17 DIAGNOSIS — G4733 Obstructive sleep apnea (adult) (pediatric): Secondary | ICD-10-CM | POA: Diagnosis not present

## 2019-09-17 DIAGNOSIS — I2510 Atherosclerotic heart disease of native coronary artery without angina pectoris: Secondary | ICD-10-CM | POA: Diagnosis not present

## 2019-09-17 DIAGNOSIS — Z87891 Personal history of nicotine dependence: Secondary | ICD-10-CM | POA: Insufficient documentation

## 2019-09-17 DIAGNOSIS — Z5181 Encounter for therapeutic drug level monitoring: Secondary | ICD-10-CM | POA: Diagnosis not present

## 2019-09-17 DIAGNOSIS — I4891 Unspecified atrial fibrillation: Secondary | ICD-10-CM | POA: Diagnosis not present

## 2019-09-17 DIAGNOSIS — R52 Pain, unspecified: Secondary | ICD-10-CM | POA: Diagnosis not present

## 2019-09-17 DIAGNOSIS — E1151 Type 2 diabetes mellitus with diabetic peripheral angiopathy without gangrene: Secondary | ICD-10-CM | POA: Diagnosis not present

## 2019-09-17 DIAGNOSIS — I130 Hypertensive heart and chronic kidney disease with heart failure and stage 1 through stage 4 chronic kidney disease, or unspecified chronic kidney disease: Secondary | ICD-10-CM | POA: Diagnosis not present

## 2019-09-17 DIAGNOSIS — N183 Chronic kidney disease, stage 3 unspecified: Secondary | ICD-10-CM | POA: Diagnosis not present

## 2019-09-17 DIAGNOSIS — N184 Chronic kidney disease, stage 4 (severe): Secondary | ICD-10-CM | POA: Diagnosis not present

## 2019-09-17 DIAGNOSIS — R279 Unspecified lack of coordination: Secondary | ICD-10-CM | POA: Diagnosis not present

## 2019-09-17 DIAGNOSIS — I4821 Permanent atrial fibrillation: Secondary | ICD-10-CM | POA: Diagnosis not present

## 2019-09-17 DIAGNOSIS — E1122 Type 2 diabetes mellitus with diabetic chronic kidney disease: Secondary | ICD-10-CM | POA: Diagnosis not present

## 2019-09-17 DIAGNOSIS — D62 Acute posthemorrhagic anemia: Secondary | ICD-10-CM | POA: Diagnosis not present

## 2019-09-17 DIAGNOSIS — B1910 Unspecified viral hepatitis B without hepatic coma: Secondary | ICD-10-CM | POA: Diagnosis not present

## 2019-09-17 DIAGNOSIS — Z9889 Other specified postprocedural states: Secondary | ICD-10-CM | POA: Diagnosis not present

## 2019-09-17 DIAGNOSIS — Z79899 Other long term (current) drug therapy: Secondary | ICD-10-CM | POA: Insufficient documentation

## 2019-09-17 DIAGNOSIS — R0689 Other abnormalities of breathing: Secondary | ICD-10-CM | POA: Diagnosis not present

## 2019-09-17 DIAGNOSIS — I491 Atrial premature depolarization: Secondary | ICD-10-CM | POA: Diagnosis not present

## 2019-09-17 DIAGNOSIS — G546 Phantom limb syndrome with pain: Secondary | ICD-10-CM | POA: Diagnosis not present

## 2019-09-17 DIAGNOSIS — I700 Atherosclerosis of aorta: Secondary | ICD-10-CM | POA: Diagnosis not present

## 2019-09-17 DIAGNOSIS — E785 Hyperlipidemia, unspecified: Secondary | ICD-10-CM | POA: Diagnosis not present

## 2019-09-17 DIAGNOSIS — J45909 Unspecified asthma, uncomplicated: Secondary | ICD-10-CM | POA: Diagnosis not present

## 2019-09-17 DIAGNOSIS — F419 Anxiety disorder, unspecified: Secondary | ICD-10-CM | POA: Diagnosis not present

## 2019-09-17 DIAGNOSIS — I252 Old myocardial infarction: Secondary | ICD-10-CM | POA: Diagnosis not present

## 2019-09-17 DIAGNOSIS — T8789 Other complications of amputation stump: Secondary | ICD-10-CM | POA: Diagnosis not present

## 2019-09-17 DIAGNOSIS — R000 Tachycardia, unspecified: Secondary | ICD-10-CM | POA: Diagnosis not present

## 2019-09-17 DIAGNOSIS — Z743 Need for continuous supervision: Secondary | ICD-10-CM | POA: Diagnosis not present

## 2019-09-17 DIAGNOSIS — I5043 Acute on chronic combined systolic (congestive) and diastolic (congestive) heart failure: Secondary | ICD-10-CM | POA: Diagnosis not present

## 2019-09-17 DIAGNOSIS — F329 Major depressive disorder, single episode, unspecified: Secondary | ICD-10-CM | POA: Diagnosis not present

## 2019-09-17 LAB — URINALYSIS, ROUTINE W REFLEX MICROSCOPIC
Bilirubin Urine: NEGATIVE
Glucose, UA: NEGATIVE mg/dL
Hgb urine dipstick: NEGATIVE
Ketones, ur: NEGATIVE mg/dL
Leukocytes,Ua: NEGATIVE
Nitrite: NEGATIVE
Protein, ur: NEGATIVE mg/dL
Specific Gravity, Urine: 1.01 (ref 1.005–1.030)
pH: 6 (ref 5.0–8.0)

## 2019-09-17 LAB — CBC
HCT: 29.5 % — ABNORMAL LOW (ref 39.0–52.0)
Hemoglobin: 9.2 g/dL — ABNORMAL LOW (ref 13.0–17.0)
MCH: 26.4 pg (ref 26.0–34.0)
MCHC: 31.2 g/dL (ref 30.0–36.0)
MCV: 84.8 fL (ref 80.0–100.0)
Platelets: 216 10*3/uL (ref 150–400)
RBC: 3.48 MIL/uL — ABNORMAL LOW (ref 4.22–5.81)
RDW: 17.2 % — ABNORMAL HIGH (ref 11.5–15.5)
WBC: 11.3 10*3/uL — ABNORMAL HIGH (ref 4.0–10.5)
nRBC: 0 % (ref 0.0–0.2)

## 2019-09-17 LAB — BASIC METABOLIC PANEL
Anion gap: 9 (ref 5–15)
BUN: 48 mg/dL — ABNORMAL HIGH (ref 8–23)
CO2: 24 mmol/L (ref 22–32)
Calcium: 9.3 mg/dL (ref 8.9–10.3)
Chloride: 105 mmol/L (ref 98–111)
Creatinine, Ser: 1.7 mg/dL — ABNORMAL HIGH (ref 0.61–1.24)
GFR calc Af Amer: 46 mL/min — ABNORMAL LOW (ref >60)
GFR calc non Af Amer: 40 mL/min — ABNORMAL LOW (ref >60)
Glucose, Bld: 115 mg/dL — ABNORMAL HIGH (ref 70–99)
Potassium: 4.6 mmol/L (ref 3.5–5.1)
Sodium: 138 mmol/L (ref 135–145)

## 2019-09-17 MED ORDER — DOCUSATE SODIUM 250 MG PO CAPS: 250.0000 mg | ORAL_CAPSULE | Freq: Every day | ORAL | 0 refills | Status: AC

## 2019-09-17 NOTE — ED Notes (Signed)
Pt given leg bag, and instruction on how to use it.

## 2019-09-17 NOTE — ED Triage Notes (Signed)
Per EMS, Pt was recently discharged from [facility] following a AKA. Pt urinated last at 0500 today. Pt has not been able to urinate since. Pain began at 1100. Pt has not had retention before. Hx of afib. Once pt transported on to ED stretcher pt stated he was able to begin peeing some, with difficulty. Pt is still in pain, and diaphoretic.
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
## 2019-09-17 NOTE — ED Provider Notes (Signed)
[place] [facility] DEPT Provider Note   CSN: [id number] Arrival date & time: 09/17/19  1511     History   Chief Complaint No chief complaint on file.   HPI Joseph Hoffman is a 68 y.o. male.     68 year old male presents with urinary retention times several hours.  States that when he pees it hurts and has suprapubic pressure.  Denies any flank pain or emesis.  Recently discharged from the hospital after having surgery.  No prior history of BPH.     Past Medical History:  Diagnosis Date  . Anxiety   . Arthritis   . Asthma   . CAD (coronary artery disease)   . Carotid artery disease ([place])    a. dopplers 06/2019 - Evidence consistent with a total occlusion of the left ICA, 1-39% RICA.  [name] Chronic combined systolic and diastolic CHF (congestive heart failure) ([place])   . CKD (chronic kidney disease), stage IV ([place])   . Diabetic foot ulcer ([place])   . DM (diabetes mellitus) ([place]) 09/30/2012  . Hepatitis B 2003  . Hx of AKA (above knee amputation), left ([place])   . Hyperlipidemia   . Hypertension   . Myocardial infarction ([place])   . Permanent atrial fibrillation, since 1994   . S/P AVR (aortic valve replacement), 09/30/2012   a. s/p mechanical AVR in 09/2012 (on Coumadin)  . Severe aortic insufficiency   . Sleep apnea    uses CPAP    Patient Active Problem List   Diagnosis Date Noted  . Widened pulse pressure   . Acute blood loss anemia   . Labile blood glucose   . Above knee amputation of left lower extremity ([place]) 08/17/2019  . Nonhealing surgical wound 08/13/2019  . Non-healing amputation site ([place]) 08/13/2019  . Type 2 diabetes mellitus with peripheral neuropathy (HCC)   . Chronic combined systolic and diastolic CHF (congestive heart failure) ([place])   . Myalgia   . Urinary retention   . Insomnia due to medical condition   . Drug-induced constipation   . Anticoagulated on Coumadin   . Unilateral complete BKA, left, initial encounter ([place])  08/05/2019  . Leukocytosis   . Acute on chronic anemia   . Post-operative pain   . Nausea & vomiting 07/29/2019  . Diarrhea 07/29/2019  . Diabetic ulcer of left foot associated with diabetes mellitus due to underlying condition ([place]) 07/29/2019  . Left ventricular ejection fraction of 30% to 35% 07/29/2019  . PAD (peripheral artery disease) ([place]) 07/28/2019  . Long term (current) use of anticoagulants 07/13/2019  . CAD, multiple vessel 07/10/2019  . Severe aortic regurgitation   . Aortic valve disease   . Foot ulcer, left ([place]) 06/26/2019  . Depression 05/28/2019  . Congestive heart failure (CHF) ([place]) 05/13/2019  . H/O heart valve replacement with mechanical valve   . Type II diabetes mellitus with renal manifestations ([place]) 05/12/2019  . Chest pain 05/12/2019  . Elevated troponin 05/12/2019  . Stage 3 chronic kidney disease 05/12/2019  . Acute on chronic systolic (congestive) heart failure ([place]) 05/12/2019  . CRI (chronic renal insufficiency), stage 3 (moderate) 04/09/2019  . Acute combined systolic and diastolic heart failure ([place]) 04/09/2019  . Essential hypertension 06/03/2017  . Dyslipidemia, goal LDL below 70 04/11/2017  . AS (aortic stenosis) 10/28/2012  . S/P AVR, 09/29/12, [employer] [facility]. (discharged 10/05/12) 09/30/2012  . Permanent atrial fibrillation, since 1994 09/30/2012  . Type 2 IDDM 09/30/2012  . OSA on CPAP 09/30/2012  .  Normal coronary arteries at cath Oct 2013 09/30/2012  . Chronic anticoagulation 09/30/2012  . Severe aortic stenosis 09/25/2012    Past Surgical History:  Procedure Laterality Date  . ABDOMINAL ANGIOGRAM  09/18/2012   Procedure: ABDOMINAL ANGIOGRAM;  Surgeon: Troy Sine, MD;  Location: [facility] CATH LAB;  Service: Cardiovascular;;  . ABDOMINAL AORTOGRAM W/LOWER EXTREMITY N/A 07/02/2019   Procedure: ABDOMINAL AORTOGRAM W/LOWER EXTREMITY;  Surgeon: Marty Heck, MD;  Location: [place] CV LAB;  Service: Cardiovascular;  Laterality: N/A;  .  AMPUTATION Left 07/03/2019   Procedure: AMPUTATION LEFT GREAT TOE;  Surgeon: Marty Heck, MD;  Location: [place];  Service: Vascular;  Laterality: Left;  . AMPUTATION Left 07/31/2019   Procedure: AMPUTATION BELOW KNEE LEFT;  Surgeon: Marty Heck, MD;  Location: [place];  Service: Vascular;  Laterality: Left;  . AMPUTATION Left 08/13/2019   Procedure: LEFT AMPUTATION ABOVE KNEE;  Surgeon: Marty Heck, MD;  Location: [place];  Service: Vascular;  Laterality: Left;  . AORTIC VALVE REPLACEMENT  09/29/2012   Procedure: AORTIC VALVE REPLACEMENT (AVR);  Surgeon: Gaye Pollack, MD;  Location: [facility] [facility];  Service: Open Heart Surgery;  Laterality: N/A;  . ARCH AORTOGRAM  09/18/2012   Procedure: ARCH AORTOGRAM;  Surgeon: Troy Sine, MD;  Location: [facility] CATH LAB;  Service: Cardiovascular;;  . CARDIAC CATHETERIZATION  09/18/12   severe calcific aortic stenosis, peak gradient 91mm, mean gradient 49mm, EF 45%  . CARDIAC VALVE REPLACEMENT     AVR 09-29-12  . CHOLECYSTECTOMY    . FRACTURE SURGERY    . LEFT AND RIGHT HEART CATHETERIZATION WITH CORONARY ANGIOGRAM N/A 09/18/2012   Procedure: LEFT AND RIGHT HEART CATHETERIZATION WITH CORONARY ANGIOGRAM;  Surgeon: Troy Sine, MD;  Location: [facility] CATH LAB;  Service: Cardiovascular;  Laterality: N/A;  . PERIPHERAL VASCULAR INTERVENTION Left 07/02/2019   Procedure: PERIPHERAL VASCULAR INTERVENTION;  Surgeon: Marty Heck, MD;  Location: [place] CV LAB;  Service: Cardiovascular;  Laterality: Left;  . RIGHT HEART CATH AND CORONARY ANGIOGRAPHY N/A 06/29/2019   Procedure: RIGHT HEART CATH AND CORONARY ANGIOGRAPHY;  Surgeon: Nelva Bush, MD;  Location: [place] CV LAB;  Service: Cardiovascular;  Laterality: N/A;  . TEE WITHOUT CARDIOVERSION N/A 05/29/2019   Procedure: TRANSESOPHAGEAL ECHOCARDIOGRAM (TEE);  Surgeon: Lelon Perla, MD;  Location: [facility] ENDOSCOPY;  Service: Cardiovascular;  Laterality: N/A;        Home Medications     Prior to Admission medications   Medication Sig Start Date End Date Taking? Authorizing Provider  acetaminophen (TYLENOL) 325 MG tablet Take 1-2 tablets (325-650 mg total) by mouth every 4 (four) hours as needed for mild pain. 08/18/19   Love, Ivan Anchors, PA-C  albuterol (PROVENTIL HFA;VENTOLIN HFA) 108 (90 BASE) MCG/ACT inhaler Inhale 2 puffs into the lungs every 6 (six) hours as needed for wheezing or shortness of breath.     [provider]  B Complex-C (B-COMPLEX WITH VITAMIN C) tablet Take 1 tablet by mouth daily. 08/06/19   Swayze, Ava, DO  B-D ULTRAFINE III SHORT PEN 31G X 8 MM MISC  10/04/13   [provider]  bethanechol (URECHOLINE) 10 MG tablet Take 1 tablet (10 mg total) by mouth 3 (three) times daily. 09/15/19   Love, Ivan Anchors, PA-C  cholecalciferol (VITAMIN D) 1000 UNITS tablet Take 1,000 Units by mouth daily with breakfast.     [provider]  clonazePAM (KLONOPIN) 0.25 MG disintegrating tablet Take 1 tablet (0.25 mg total) by mouth  2 (two) times daily as needed (tremors/anxiety). 09/15/19   Love, Ivan Anchors, PA-C  cyclobenzaprine (FLEXERIL) 5 MG tablet Take 1 tablet (5 mg total) by mouth 3 (three) times daily. 09/15/19   Love, Ivan Anchors, PA-C  doxycycline (VIBRA-TABS) 100 MG tablet Take 1 tablet (100 mg total) by mouth every 12 (twelve) hours. 09/15/19   Love, Ivan Anchors, PA-C  EASY TOUCH LANCETS 30G/TWIST MISC  11/05/13   [provider]  escitalopram (LEXAPRO) 10 MG tablet Take 10 mg by mouth at bedtime.     [provider]  ezetimibe (ZETIA) 10 MG tablet Take 1 tablet (10 mg total) by mouth daily. 02/09/19 07/26/22  Almyra Deforest, PA  fenofibrate 160 MG tablet Take 1 tablet (160 mg total) by mouth daily. Patient taking differently: Take 160 mg by mouth daily with breakfast.  05/16/19   Black, Lezlie Octave, NP  Fluticasone-Salmeterol (ADVAIR) 250-50 MCG/DOSE AEPB Inhale 1 puff into the lungs 2 (two) times daily as needed (shortness of breath/asthma related  symptoms).     [provider]  glimepiride (AMARYL) 2 MG tablet Take 1 tablet (2 mg total) by mouth daily with breakfast. 09/16/19   Love, Ivan Anchors, PA-C  hydrocortisone cream 1 % Apply topically 3 (three) times daily. 09/14/19   Love, Ivan Anchors, PA-C  Icosapent Ethyl (VASCEPA) 1 g CAPS Take 2 capsules (2 g total) by mouth 2 (two) times daily. Patient taking differently: Take 2 g by mouth 2 (two) times daily with a meal.  04/20/19   Troy Sine, MD  insulin glargine (LANTUS) 100 UNIT/ML injection Inject 0.38 mLs (38 Units total) into the skin 2 (two) times daily. I 09/15/19   Love, Ivan Anchors, PA-C  liraglutide (VICTOZA) 18 MG/3ML SOPN Inject 1.8 mg into the skin at bedtime.    [provider]  metoprolol succinate (TOPROL XL) 25 MG 24 hr tablet Take 0.5 tablets (12.5 mg total) by mouth daily. 09/15/19   Love, Ivan Anchors, PA-C  nitroGLYCERIN (NITROSTAT) 0.4 MG SL tablet Place 1 tablet (0.4 mg total) under the tongue every 5 (five) minutes as needed for chest pain. Patient taking differently: Place 0.4 mg under the tongue every 5 (five) minutes x 3 doses as needed for chest pain.  05/30/19   [place] Shi, MD  oxyCODONE (OXY IR/ROXICODONE) 5 MG immediate release tablet Take 1-2 tablets (5-10 mg total) by mouth 2 (two) times daily as needed for severe pain or breakthrough pain. 09/15/19   Love, Ivan Anchors, PA-C  pantoprazole (PROTONIX) 40 MG tablet Take 1 tablet (40 mg total) by mouth daily. 09/16/19   Love, Ivan Anchors, PA-C  saccharomyces boulardii (FLORASTOR) 250 MG capsule Take 250 mg by mouth 3 (three) times a week.     [provider]  sacubitril-valsartan (ENTRESTO) 24-26 MG Take 1 tablet by mouth 2 (two) times daily. 07/10/19   [place] Donnamarie Poag, DO  tamsulosin (FLOMAX) 0.4 MG CAPS capsule Take 1 capsule (0.4 mg total) by mouth daily after supper. 09/15/19   Love, Ivan Anchors, PA-C  torsemide (DEMADEX) 20 MG tablet Take 2 tablets (40 mg total) by mouth every morning. Patient  taking differently: Take 20-40 mg by mouth See admin instructions. Take 2 tablets (40 mg) by mouth daily in the morning & take 1 tablet (20 mg) by mouth at night 07/11/19   [place] Donnamarie Poag, DO  traMADol (ULTRAM) 50 MG tablet Take 1 tablet (50 mg total) by mouth every 6 (six) hours as needed for moderate pain.  09/15/19   Love, Ivan Anchors, PA-C  vitamin C (ASCORBIC ACID) 500 MG tablet Take 500 mg by mouth 2 (two) times daily.    [provider]  warfarin (COUMADIN) 5 MG tablet Take 2 tablets (10 mg total) by mouth daily at 6 PM. 09/15/19   Love, Ivan Anchors, PA-C    Family History Family History  Problem Relation Age of Onset  . Hypertension Mother   . Diabetes Father   . Heart attack Brother 23  . Hyperlipidemia Brother 34       stents placed    Social History Social History   Tobacco Use  . Smoking status: Former Smoker    Packs/day: 0.25    Years: 10.00    Pack years: 2.50    Types: Cigarettes    Quit date: 09/25/1982    Years since quitting: 37.0  . Smokeless tobacco: Former Network engineer Use Topics  . Alcohol use: Yes    Comment: occ. beer/wine  . Drug use: No     Allergies   Iohexol, Niacin and related, and Penicillins   Review of Systems Review of Systems  All other systems reviewed and are negative.    Physical Exam Updated Vital Signs BP (!) 141/76   Pulse 82   Temp 99.2 F (37.3 C) (Oral)   Resp 16   Ht 1.803 m (5\' 11" )   Wt 101 kg   SpO2 94%   BMI 31.06 kg/m   Physical Exam Vitals signs and nursing note reviewed.  Constitutional:      General: He is not in acute distress.    Appearance: Normal appearance. He is well-developed. He is not toxic-appearing.  HENT:     Head: Normocephalic and atraumatic.  Eyes:     General: Lids are normal.     Conjunctiva/sclera: Conjunctivae normal.     Pupils: Pupils are equal, round, and reactive to light.  Neck:     Musculoskeletal: Normal range of motion and neck supple.     Thyroid: No thyroid  mass.     Trachea: No tracheal deviation.  Cardiovascular:     Rate and Rhythm: Normal rate and regular rhythm.     Heart sounds: Normal heart sounds. No murmur. No gallop.   Pulmonary:     Effort: Pulmonary effort is normal. No respiratory distress.     Breath sounds: Normal breath sounds. No stridor. No decreased breath sounds, wheezing, rhonchi or rales.  Abdominal:     General: Bowel sounds are normal. There is no distension.     Palpations: Abdomen is soft.     Tenderness: There is abdominal tenderness in the suprapubic area. There is no rebound.    Musculoskeletal: Normal range of motion.        General: No tenderness.  Skin:    General: Skin is warm and dry.     Findings: No abrasion or rash.  Neurological:     Mental Status: He is alert and oriented to person, place, and time.     GCS: GCS eye subscore is 4. GCS verbal subscore is 5. GCS motor subscore is 6.     Cranial Nerves: No cranial nerve deficit.     Sensory: No sensory deficit.  Psychiatric:        Speech: Speech normal.        Behavior: Behavior normal.      ED Treatments / Results  Labs (all labs ordered are listed, but only abnormal results are displayed) Labs Reviewed  URINE CULTURE  URINE CULTURE  URINALYSIS, ROUTINE W REFLEX MICROSCOPIC  BASIC METABOLIC PANEL  CBC  URINALYSIS, ROUTINE W REFLEX MICROSCOPIC    EKG None  Radiology No results found.  Procedures Procedures (including critical care time)  Medications Ordered in ED Medications - No data to display   Initial Impression / Assessment and Plan / ED Course  I have reviewed the triage vital signs and the nursing notes.  Pertinent labs & imaging results that were available during my care of the patient were reviewed by me and considered in my medical decision making (see chart for details).        Patient had Foley catheter placed with good drainage.  Will prescribe stool softener and give urology referral  Final Clinical  Impressions(s) / ED Diagnoses   Final diagnoses:  None    ED Discharge Orders    None       Lacretia Leigh, MD 09/17/19 1857

## 2019-09-17 NOTE — ED Notes (Signed)
Ptar contacted.

## 2019-09-17 NOTE — ED Notes (Signed)
Post void residual scan showed at least 212ml of urine still in pts bladder.

## 2019-09-17 NOTE — ED Notes (Signed)
700 ml noted following placement of foley catheter.

## 2019-09-18 DIAGNOSIS — G546 Phantom limb syndrome with pain: Secondary | ICD-10-CM | POA: Diagnosis not present

## 2019-09-18 DIAGNOSIS — E1151 Type 2 diabetes mellitus with diabetic peripheral angiopathy without gangrene: Secondary | ICD-10-CM | POA: Diagnosis not present

## 2019-09-18 DIAGNOSIS — D62 Acute posthemorrhagic anemia: Secondary | ICD-10-CM | POA: Diagnosis not present

## 2019-09-18 DIAGNOSIS — I130 Hypertensive heart and chronic kidney disease with heart failure and stage 1 through stage 4 chronic kidney disease, or unspecified chronic kidney disease: Secondary | ICD-10-CM | POA: Diagnosis not present

## 2019-09-18 DIAGNOSIS — I5043 Acute on chronic combined systolic (congestive) and diastolic (congestive) heart failure: Secondary | ICD-10-CM | POA: Diagnosis not present

## 2019-09-18 DIAGNOSIS — T8789 Other complications of amputation stump: Secondary | ICD-10-CM | POA: Diagnosis not present

## 2019-09-18 LAB — URINE CULTURE: Culture: NO GROWTH

## 2019-09-21 ENCOUNTER — Telehealth: Payer: Self-pay | Admitting: Pharmacist

## 2019-09-21 DIAGNOSIS — I5043 Acute on chronic combined systolic (congestive) and diastolic (congestive) heart failure: Secondary | ICD-10-CM | POA: Diagnosis not present

## 2019-09-21 DIAGNOSIS — D62 Acute posthemorrhagic anemia: Secondary | ICD-10-CM | POA: Diagnosis not present

## 2019-09-21 DIAGNOSIS — G546 Phantom limb syndrome with pain: Secondary | ICD-10-CM | POA: Diagnosis not present

## 2019-09-21 DIAGNOSIS — I130 Hypertensive heart and chronic kidney disease with heart failure and stage 1 through stage 4 chronic kidney disease, or unspecified chronic kidney disease: Secondary | ICD-10-CM | POA: Diagnosis not present

## 2019-09-21 DIAGNOSIS — T8789 Other complications of amputation stump: Secondary | ICD-10-CM | POA: Diagnosis not present

## 2019-09-21 DIAGNOSIS — E1151 Type 2 diabetes mellitus with diabetic peripheral angiopathy without gangrene: Secondary | ICD-10-CM | POA: Diagnosis not present

## 2019-09-21 NOTE — Telephone Encounter (Signed)
INR reported by [facility] to PCP, Dr Olen Pel, on 09/18/2019. DR Prudence Davidson gave dosing and follow up orders to [place].  Will defer INR and warfarin management to DR [facility]

## 2019-09-22 ENCOUNTER — Telehealth: Payer: Self-pay

## 2019-09-22 NOTE — Telephone Encounter (Signed)
Timmothy Sours, OT from [facility] called requesting verbal orders for [facility] 1wk2, 0wk2, 1wk1. Orders approved and given.

## 2019-09-24 ENCOUNTER — Encounter: Payer: Medicare Other | Admitting: Physical Medicine and Rehabilitation

## 2019-09-24 DIAGNOSIS — I5043 Acute on chronic combined systolic (congestive) and diastolic (congestive) heart failure: Secondary | ICD-10-CM | POA: Diagnosis not present

## 2019-09-24 DIAGNOSIS — T8789 Other complications of amputation stump: Secondary | ICD-10-CM | POA: Diagnosis not present

## 2019-09-24 DIAGNOSIS — G546 Phantom limb syndrome with pain: Secondary | ICD-10-CM | POA: Diagnosis not present

## 2019-09-24 DIAGNOSIS — E1151 Type 2 diabetes mellitus with diabetic peripheral angiopathy without gangrene: Secondary | ICD-10-CM | POA: Diagnosis not present

## 2019-09-24 DIAGNOSIS — I130 Hypertensive heart and chronic kidney disease with heart failure and stage 1 through stage 4 chronic kidney disease, or unspecified chronic kidney disease: Secondary | ICD-10-CM | POA: Diagnosis not present

## 2019-09-24 DIAGNOSIS — D62 Acute posthemorrhagic anemia: Secondary | ICD-10-CM | POA: Diagnosis not present

## 2019-09-25 ENCOUNTER — Telehealth: Payer: Self-pay | Admitting: *Deleted

## 2019-09-25 NOTE — Telephone Encounter (Signed)
[facility] OT called to request permission to move Nov 1 visit to next week.  Approval given.

## 2019-09-29 ENCOUNTER — Encounter: Payer: Self-pay | Admitting: Vascular Surgery

## 2019-09-29 ENCOUNTER — Ambulatory Visit (INDEPENDENT_AMBULATORY_CARE_PROVIDER_SITE_OTHER): Payer: Self-pay | Admitting: Vascular Surgery

## 2019-09-29 ENCOUNTER — Other Ambulatory Visit: Payer: Self-pay

## 2019-09-29 VITALS — BP 125/65 | HR 94 | Temp 98.3°F | Resp 20 | Ht 71.0 in | Wt 222.6 lb

## 2019-09-29 DIAGNOSIS — N401 Enlarged prostate with lower urinary tract symptoms: Secondary | ICD-10-CM | POA: Diagnosis not present

## 2019-09-29 DIAGNOSIS — I739 Peripheral vascular disease, unspecified: Secondary | ICD-10-CM

## 2019-09-29 DIAGNOSIS — N130 Hydronephrosis with ureteropelvic junction obstruction: Secondary | ICD-10-CM | POA: Diagnosis not present

## 2019-09-29 DIAGNOSIS — R338 Other retention of urine: Secondary | ICD-10-CM | POA: Diagnosis not present

## 2019-09-29 NOTE — Progress Notes (Signed)
Patient name: Joseph Hoffman MRN: [id number] DOB: 12-22-1950 Sex: male  REASON FOR VISIT: Postop check after left above-knee amputation  HPI: Joseph Hoffman is a 68 y.o. male multiple medical problems that presents for postop check after left above-knee amputation on 08/13/2019.  Patient presented with critical limb ischemia initially underwent arteriogram with a left first toe ray amp on 07/03/2019.  Ultimately the toe amp did not heal.  Then underwent a left below-knee amputation on 07/31/2019.  This also failed to heal.  Ultimately progressed to above-knee amputation on 08/13/2019.  Patient had staples taken out several weeks ago in rehab.  No fevers, chills, drainage.  Feels the wound is healing.  No pain in the stump.  He has an appointment with [employer] tomorrow for stump shrinker and further evaluation for prosthesis.  Past Medical History:  Diagnosis Date  . Anxiety   . Arthritis   . Asthma   . CAD (coronary artery disease)   . Carotid artery disease ([place])    a. dopplers 06/2019 - Evidence consistent with a total occlusion of the left ICA, 1-39% RICA.  [name] Chronic combined systolic and diastolic CHF (congestive heart failure) ([place])   . CKD (chronic kidney disease), stage IV ([place])   . Diabetic foot ulcer ([place])   . DM (diabetes mellitus) ([place]) 09/30/2012  . Hepatitis B 2003  . Hx of AKA (above knee amputation), left ([place])   . Hyperlipidemia   . Hypertension   . Myocardial infarction ([place])   . Permanent atrial fibrillation, since 1994   . S/P AVR (aortic valve replacement), 09/30/2012   a. s/p mechanical AVR in 09/2012 (on Coumadin)  . Severe aortic insufficiency   . Sleep apnea    uses CPAP    Past Surgical History:  Procedure Laterality Date  . ABDOMINAL ANGIOGRAM  09/18/2012   Procedure: ABDOMINAL ANGIOGRAM;  Surgeon: Troy Sine, MD;  Location: [facility] CATH LAB;  Service: Cardiovascular;;  . ABDOMINAL AORTOGRAM W/LOWER EXTREMITY N/A 07/02/2019   Procedure: ABDOMINAL AORTOGRAM  W/LOWER EXTREMITY;  Surgeon: Marty Heck, MD;  Location: [place] CV LAB;  Service: Cardiovascular;  Laterality: N/A;  . AMPUTATION Left 07/03/2019   Procedure: AMPUTATION LEFT GREAT TOE;  Surgeon: Marty Heck, MD;  Location: [place];  Service: Vascular;  Laterality: Left;  . AMPUTATION Left 07/31/2019   Procedure: AMPUTATION BELOW KNEE LEFT;  Surgeon: Marty Heck, MD;  Location: [place];  Service: Vascular;  Laterality: Left;  . AMPUTATION Left 08/13/2019   Procedure: LEFT AMPUTATION ABOVE KNEE;  Surgeon: Marty Heck, MD;  Location: [place];  Service: Vascular;  Laterality: Left;  . AORTIC VALVE REPLACEMENT  09/29/2012   Procedure: AORTIC VALVE REPLACEMENT (AVR);  Surgeon: Gaye Pollack, MD;  Location: [place];  Service: Open Heart Surgery;  Laterality: N/A;  . ARCH AORTOGRAM  09/18/2012   Procedure: ARCH AORTOGRAM;  Surgeon: Troy Sine, MD;  Location: [facility] CATH LAB;  Service: Cardiovascular;;  . CARDIAC CATHETERIZATION  09/18/12   severe calcific aortic stenosis, peak gradient 79mm, mean gradient 30mm, EF 45%  . CARDIAC VALVE REPLACEMENT     AVR 09-29-12  . CHOLECYSTECTOMY    . FRACTURE SURGERY    . LEFT AND RIGHT HEART CATHETERIZATION WITH CORONARY ANGIOGRAM N/A 09/18/2012   Procedure: LEFT AND RIGHT HEART CATHETERIZATION WITH CORONARY ANGIOGRAM;  Surgeon: Troy Sine, MD;  Location: [facility] CATH LAB;  Service: Cardiovascular;  Laterality: N/A;  . PERIPHERAL VASCULAR INTERVENTION Left 07/02/2019  Procedure: PERIPHERAL VASCULAR INTERVENTION;  Surgeon: Marty Heck, MD;  Location: [place] CV LAB;  Service: Cardiovascular;  Laterality: Left;  . RIGHT HEART CATH AND CORONARY ANGIOGRAPHY N/A 06/29/2019   Procedure: RIGHT HEART CATH AND CORONARY ANGIOGRAPHY;  Surgeon: Nelva Bush, MD;  Location: [place] CV LAB;  Service: Cardiovascular;  Laterality: N/A;  . TEE WITHOUT CARDIOVERSION N/A 05/29/2019   Procedure: TRANSESOPHAGEAL ECHOCARDIOGRAM (TEE);   Surgeon: Lelon Perla, MD;  Location: [facility] ENDOSCOPY;  Service: Cardiovascular;  Laterality: N/A;    Family History  Problem Relation Age of Onset  . Hypertension Mother   . Diabetes Father   . Heart attack Brother 90  . Hyperlipidemia Brother 63       stents placed    SOCIAL HISTORY: Social History   Tobacco Use  . Smoking status: Former Smoker    Packs/day: 0.25    Years: 10.00    Pack years: 2.50    Types: Cigarettes    Quit date: 09/25/1982    Years since quitting: 37.0  . Smokeless tobacco: Former Network engineer Use Topics  . Alcohol use: Yes    Comment: occ. beer/wine    Allergies  Allergen Reactions  . Iohexol Anaphylaxis  . Niacin And Related     Flushing with immediate realese  . Penicillins Other (See Comments)    Unknown.[name]  Did it involve swelling of the face/tongue/throat, SOB, or low BP? Unknown Did it involve sudden or severe rash/hives, skin peeling, or any reaction on the inside of your mouth or nose? Unknown Did you need to seek medical attention at a hospital or doctor's office? Unknown When did it last happen?Childhood If all above answers are "NO", may proceed with cephalosporin use.    Current Outpatient Medications  Medication Sig Dispense Refill  . acetaminophen (TYLENOL) 325 MG tablet Take 1-2 tablets (325-650 mg total) by mouth every 4 (four) hours as needed for mild pain.    [name] albuterol (PROVENTIL HFA;VENTOLIN HFA) 108 (90 BASE) MCG/ACT inhaler Inhale 2 puffs into the lungs every 6 (six) hours as needed for wheezing or shortness of breath.     . B Complex-C (B-COMPLEX WITH VITAMIN C) tablet Take 1 tablet by mouth daily. 30 tablet 0  . B-D ULTRAFINE III SHORT PEN 31G X 8 MM MISC     . bethanechol (URECHOLINE) 10 MG tablet Take 1 tablet (10 mg total) by mouth 3 (three) times daily. 90 tablet 0  . cholecalciferol (VITAMIN D) 1000 UNITS tablet Take 1,000 Units by mouth daily with breakfast.     . clonazePAM  (KLONOPIN) 0.25 MG disintegrating tablet Take 1 tablet (0.25 mg total) by mouth 2 (two) times daily as needed (tremors/anxiety). 60 tablet 0  . cyclobenzaprine (FLEXERIL) 5 MG tablet Take 1 tablet (5 mg total) by mouth 3 (three) times daily. 90 tablet 0  . docusate sodium (COLACE) 250 MG capsule Take 1 capsule (250 mg total) by mouth daily. 10 capsule 0  . doxycycline (VIBRA-TABS) 100 MG tablet Take 1 tablet (100 mg total) by mouth every 12 (twelve) hours. 5 tablet 0  . EASY TOUCH LANCETS 30G/TWIST MISC     . escitalopram (LEXAPRO) 10 MG tablet Take 10 mg by mouth at bedtime.     [name] ezetimibe (ZETIA) 10 MG tablet Take 1 tablet (10 mg total) by mouth daily. 90 tablet 3  . fenofibrate 160 MG tablet Take 1 tablet (160 mg total) by mouth daily. (Patient taking differently: Take  160 mg by mouth daily with breakfast. ) 30 tablet 1  . Fluticasone-Salmeterol (ADVAIR) 250-50 MCG/DOSE AEPB Inhale 1 puff into the lungs 2 (two) times daily as needed (shortness of breath/asthma related symptoms).     [name] glimepiride (AMARYL) 2 MG tablet Take 1 tablet (2 mg total) by mouth daily with breakfast. 30 tablet 0  . hydrocortisone cream 1 % Apply topically 3 (three) times daily. 30 g 0  . Icosapent Ethyl (VASCEPA) 1 g CAPS Take 2 capsules (2 g total) by mouth 2 (two) times daily. (Patient taking differently: Take 2 g by mouth 2 (two) times daily with a meal. ) 360 capsule 3  . insulin glargine (LANTUS) 100 UNIT/ML injection Inject 0.38 mLs (38 Units total) into the skin 2 (two) times daily. I 10 mL 11  . liraglutide (VICTOZA) 18 MG/3ML SOPN Inject 1.8 mg into the skin at bedtime.    . metoprolol succinate (TOPROL XL) 25 MG 24 hr tablet Take 0.5 tablets (12.5 mg total) by mouth daily. 15 tablet 1  . nitroGLYCERIN (NITROSTAT) 0.4 MG SL tablet Place 1 tablet (0.4 mg total) under the tongue every 5 (five) minutes as needed for chest pain. (Patient taking differently: Place 0.4 mg under the tongue every 5 (five) minutes x 3 doses  as needed for chest pain. ) 30 tablet 2  . oxyCODONE (OXY IR/ROXICODONE) 5 MG immediate release tablet Take 1-2 tablets (5-10 mg total) by mouth 2 (two) times daily as needed for severe pain or breakthrough pain. 28 tablet 0  . pantoprazole (PROTONIX) 40 MG tablet Take 1 tablet (40 mg total) by mouth daily. 30 tablet 0  . saccharomyces boulardii (FLORASTOR) 250 MG capsule Take 250 mg by mouth 3 (three) times a week.     . sacubitril-valsartan (ENTRESTO) 24-26 MG Take 1 tablet by mouth 2 (two) times daily. 60 tablet 3  . tamsulosin (FLOMAX) 0.4 MG CAPS capsule Take 1 capsule (0.4 mg total) by mouth daily after supper. 30 capsule 0  . torsemide (DEMADEX) 20 MG tablet Take 2 tablets (40 mg total) by mouth every morning. (Patient taking differently: Take 20-40 mg by mouth See admin instructions. Take 2 tablets (40 mg) by mouth daily in the morning & take 1 tablet (20 mg) by mouth at night) 60 tablet 3  . traMADol (ULTRAM) 50 MG tablet Take 1 tablet (50 mg total) by mouth every 6 (six) hours as needed for moderate pain. 28 tablet 0  . vitamin C (ASCORBIC ACID) 500 MG tablet Take 500 mg by mouth 2 (two) times daily.    [name] warfarin (COUMADIN) 5 MG tablet Take 2 tablets (10 mg total) by mouth daily at 6 PM. 60 tablet 0   No current facility-administered medications for this visit.     REVIEW OF SYSTEMS:  [X]  denotes positive finding, [ ]  denotes negative finding Cardiac  Comments:  Chest pain or chest pressure:    Shortness of breath upon exertion:    Short of breath when lying flat:    Irregular heart rhythm:        Vascular    Pain in calf, thigh, or hip brought on by ambulation:    Pain in feet at night that wakes you up from your sleep:     Blood clot in your veins:    Leg swelling:         Pulmonary    Oxygen at home:    Productive cough:     Wheezing:  Neurologic    Sudden weakness in arms or legs:     Sudden numbness in arms or legs:     Sudden onset of difficulty speaking or  slurred speech:    Temporary loss of vision in one eye:     Problems with dizziness:         Gastrointestinal    Blood in stool:     Vomited blood:         Genitourinary    Burning when urinating:     Blood in urine:        Psychiatric    Major depression:         Hematologic    Bleeding problems:    Problems with blood clotting too easily:        Skin    Rashes or ulcers:        Constitutional    Fever or chills:      PHYSICAL EXAM: Vitals:   09/29/19 0816  BP: 125/65  Pulse: 94  Resp: 20  Temp: 98.3 F (36.8 C)  TempSrc: Oral  SpO2: 98%  Weight: 222 lb 9.6 oz (101 kg)  Height: 5\' 11"  (1.803 m)    GENERAL: The patient is a well-nourished male, in no acute distress. The vital signs are documented above.  Sitting in wheelchair. CARDIAC: There is a regular rate and rhythm.  VASCULAR:  Left AKA well-healed.  Staples previously removed.  No drainage.  DATA:   None  Assessment/Plan:  68 year old male with multiple medical comorbidities including critical limb ischemia of the left lower extremity initially presented with tissue loss.  Ultimately had failed first toe amp on the left in July and then a failed below-knee amputation in August.  Required a left above-knee amputation on 08/13/2019.  This has now healed nicely.  Discussed that he can follow-up with me as needed in the future.  I am hopeful he can proceed with his cardiac surgery needs at this time.  He has an appointment with [employer] tomorrow for stump shrinker.   Marty Heck, MD [facility] Office: [phone] Pager: [phone]

## 2019-10-01 DIAGNOSIS — E1151 Type 2 diabetes mellitus with diabetic peripheral angiopathy without gangrene: Secondary | ICD-10-CM | POA: Diagnosis not present

## 2019-10-01 DIAGNOSIS — I130 Hypertensive heart and chronic kidney disease with heart failure and stage 1 through stage 4 chronic kidney disease, or unspecified chronic kidney disease: Secondary | ICD-10-CM | POA: Diagnosis not present

## 2019-10-01 DIAGNOSIS — I5043 Acute on chronic combined systolic (congestive) and diastolic (congestive) heart failure: Secondary | ICD-10-CM | POA: Diagnosis not present

## 2019-10-01 DIAGNOSIS — D62 Acute posthemorrhagic anemia: Secondary | ICD-10-CM | POA: Diagnosis not present

## 2019-10-01 DIAGNOSIS — G546 Phantom limb syndrome with pain: Secondary | ICD-10-CM | POA: Diagnosis not present

## 2019-10-01 DIAGNOSIS — T8789 Other complications of amputation stump: Secondary | ICD-10-CM | POA: Diagnosis not present

## 2019-10-02 DIAGNOSIS — I5043 Acute on chronic combined systolic (congestive) and diastolic (congestive) heart failure: Secondary | ICD-10-CM | POA: Diagnosis not present

## 2019-10-02 DIAGNOSIS — Z23 Encounter for immunization: Secondary | ICD-10-CM | POA: Diagnosis not present

## 2019-10-02 DIAGNOSIS — G546 Phantom limb syndrome with pain: Secondary | ICD-10-CM | POA: Diagnosis not present

## 2019-10-02 DIAGNOSIS — Z7901 Long term (current) use of anticoagulants: Secondary | ICD-10-CM | POA: Diagnosis not present

## 2019-10-02 DIAGNOSIS — T8789 Other complications of amputation stump: Secondary | ICD-10-CM | POA: Diagnosis not present

## 2019-10-02 DIAGNOSIS — I4891 Unspecified atrial fibrillation: Secondary | ICD-10-CM | POA: Diagnosis not present

## 2019-10-02 DIAGNOSIS — D62 Acute posthemorrhagic anemia: Secondary | ICD-10-CM | POA: Diagnosis not present

## 2019-10-02 DIAGNOSIS — I130 Hypertensive heart and chronic kidney disease with heart failure and stage 1 through stage 4 chronic kidney disease, or unspecified chronic kidney disease: Secondary | ICD-10-CM | POA: Diagnosis not present

## 2019-10-02 DIAGNOSIS — E1151 Type 2 diabetes mellitus with diabetic peripheral angiopathy without gangrene: Secondary | ICD-10-CM | POA: Diagnosis not present

## 2019-10-06 DIAGNOSIS — Z7901 Long term (current) use of anticoagulants: Secondary | ICD-10-CM | POA: Diagnosis not present

## 2019-10-06 DIAGNOSIS — I10 Essential (primary) hypertension: Secondary | ICD-10-CM | POA: Diagnosis not present

## 2019-10-06 DIAGNOSIS — E1142 Type 2 diabetes mellitus with diabetic polyneuropathy: Secondary | ICD-10-CM | POA: Diagnosis not present

## 2019-10-06 DIAGNOSIS — I509 Heart failure, unspecified: Secondary | ICD-10-CM | POA: Diagnosis not present

## 2019-10-06 DIAGNOSIS — Z09 Encounter for follow-up examination after completed treatment for conditions other than malignant neoplasm: Secondary | ICD-10-CM | POA: Diagnosis not present

## 2019-10-06 DIAGNOSIS — G4733 Obstructive sleep apnea (adult) (pediatric): Secondary | ICD-10-CM | POA: Diagnosis not present

## 2019-10-06 DIAGNOSIS — N183 Chronic kidney disease, stage 3 unspecified: Secondary | ICD-10-CM | POA: Diagnosis not present

## 2019-10-06 DIAGNOSIS — E782 Mixed hyperlipidemia: Secondary | ICD-10-CM | POA: Diagnosis not present

## 2019-10-06 DIAGNOSIS — I4891 Unspecified atrial fibrillation: Secondary | ICD-10-CM | POA: Diagnosis not present

## 2019-10-06 DIAGNOSIS — Z89612 Acquired absence of left leg above knee: Secondary | ICD-10-CM | POA: Diagnosis not present

## 2019-10-07 DIAGNOSIS — I700 Atherosclerosis of aorta: Secondary | ICD-10-CM

## 2019-10-07 DIAGNOSIS — I5043 Acute on chronic combined systolic (congestive) and diastolic (congestive) heart failure: Secondary | ICD-10-CM

## 2019-10-07 DIAGNOSIS — I2510 Atherosclerotic heart disease of native coronary artery without angina pectoris: Secondary | ICD-10-CM | POA: Diagnosis not present

## 2019-10-07 DIAGNOSIS — N183 Chronic kidney disease, stage 3 unspecified: Secondary | ICD-10-CM

## 2019-10-07 DIAGNOSIS — B1910 Unspecified viral hepatitis B without hepatic coma: Secondary | ICD-10-CM

## 2019-10-07 DIAGNOSIS — G4733 Obstructive sleep apnea (adult) (pediatric): Secondary | ICD-10-CM

## 2019-10-07 DIAGNOSIS — Z89612 Acquired absence of left leg above knee: Secondary | ICD-10-CM

## 2019-10-07 DIAGNOSIS — I4821 Permanent atrial fibrillation: Secondary | ICD-10-CM

## 2019-10-07 DIAGNOSIS — J9611 Chronic respiratory failure with hypoxia: Secondary | ICD-10-CM | POA: Diagnosis not present

## 2019-10-07 DIAGNOSIS — F329 Major depressive disorder, single episode, unspecified: Secondary | ICD-10-CM

## 2019-10-07 DIAGNOSIS — Z87891 Personal history of nicotine dependence: Secondary | ICD-10-CM

## 2019-10-07 DIAGNOSIS — Z7901 Long term (current) use of anticoagulants: Secondary | ICD-10-CM

## 2019-10-07 DIAGNOSIS — J45909 Unspecified asthma, uncomplicated: Secondary | ICD-10-CM

## 2019-10-07 DIAGNOSIS — E785 Hyperlipidemia, unspecified: Secondary | ICD-10-CM

## 2019-10-07 DIAGNOSIS — D62 Acute posthemorrhagic anemia: Secondary | ICD-10-CM

## 2019-10-07 DIAGNOSIS — I252 Old myocardial infarction: Secondary | ICD-10-CM

## 2019-10-07 DIAGNOSIS — Z993 Dependence on wheelchair: Secondary | ICD-10-CM

## 2019-10-07 DIAGNOSIS — Z9181 History of falling: Secondary | ICD-10-CM

## 2019-10-07 DIAGNOSIS — E1151 Type 2 diabetes mellitus with diabetic peripheral angiopathy without gangrene: Secondary | ICD-10-CM

## 2019-10-07 DIAGNOSIS — Z9981 Dependence on supplemental oxygen: Secondary | ICD-10-CM

## 2019-10-07 DIAGNOSIS — Z5181 Encounter for therapeutic drug level monitoring: Secondary | ICD-10-CM

## 2019-10-07 DIAGNOSIS — I428 Other cardiomyopathies: Secondary | ICD-10-CM

## 2019-10-07 DIAGNOSIS — Z794 Long term (current) use of insulin: Secondary | ICD-10-CM

## 2019-10-07 DIAGNOSIS — Z79899 Other long term (current) drug therapy: Secondary | ICD-10-CM

## 2019-10-07 DIAGNOSIS — I130 Hypertensive heart and chronic kidney disease with heart failure and stage 1 through stage 4 chronic kidney disease, or unspecified chronic kidney disease: Secondary | ICD-10-CM

## 2019-10-07 DIAGNOSIS — G546 Phantom limb syndrome with pain: Secondary | ICD-10-CM

## 2019-10-07 DIAGNOSIS — F419 Anxiety disorder, unspecified: Secondary | ICD-10-CM

## 2019-10-07 DIAGNOSIS — T8789 Other complications of amputation stump: Secondary | ICD-10-CM

## 2019-10-07 DIAGNOSIS — Z952 Presence of prosthetic heart valve: Secondary | ICD-10-CM

## 2019-10-07 DIAGNOSIS — E1122 Type 2 diabetes mellitus with diabetic chronic kidney disease: Secondary | ICD-10-CM | POA: Diagnosis not present

## 2019-10-09 DIAGNOSIS — D62 Acute posthemorrhagic anemia: Secondary | ICD-10-CM | POA: Diagnosis not present

## 2019-10-09 DIAGNOSIS — G546 Phantom limb syndrome with pain: Secondary | ICD-10-CM | POA: Diagnosis not present

## 2019-10-09 DIAGNOSIS — T8789 Other complications of amputation stump: Secondary | ICD-10-CM | POA: Diagnosis not present

## 2019-10-09 DIAGNOSIS — I5043 Acute on chronic combined systolic (congestive) and diastolic (congestive) heart failure: Secondary | ICD-10-CM | POA: Diagnosis not present

## 2019-10-09 DIAGNOSIS — E1151 Type 2 diabetes mellitus with diabetic peripheral angiopathy without gangrene: Secondary | ICD-10-CM | POA: Diagnosis not present

## 2019-10-09 DIAGNOSIS — I130 Hypertensive heart and chronic kidney disease with heart failure and stage 1 through stage 4 chronic kidney disease, or unspecified chronic kidney disease: Secondary | ICD-10-CM | POA: Diagnosis not present

## 2019-10-12 ENCOUNTER — Encounter: Payer: Medicare Other | Admitting: Physical Medicine and Rehabilitation

## 2019-10-12 DIAGNOSIS — Z794 Long term (current) use of insulin: Secondary | ICD-10-CM | POA: Diagnosis not present

## 2019-10-12 DIAGNOSIS — E1159 Type 2 diabetes mellitus with other circulatory complications: Secondary | ICD-10-CM | POA: Diagnosis not present

## 2019-10-12 DIAGNOSIS — E1142 Type 2 diabetes mellitus with diabetic polyneuropathy: Secondary | ICD-10-CM | POA: Diagnosis not present

## 2019-10-14 DIAGNOSIS — I130 Hypertensive heart and chronic kidney disease with heart failure and stage 1 through stage 4 chronic kidney disease, or unspecified chronic kidney disease: Secondary | ICD-10-CM | POA: Diagnosis not present

## 2019-10-14 DIAGNOSIS — I5043 Acute on chronic combined systolic (congestive) and diastolic (congestive) heart failure: Secondary | ICD-10-CM | POA: Diagnosis not present

## 2019-10-14 DIAGNOSIS — D62 Acute posthemorrhagic anemia: Secondary | ICD-10-CM | POA: Diagnosis not present

## 2019-10-14 DIAGNOSIS — E1151 Type 2 diabetes mellitus with diabetic peripheral angiopathy without gangrene: Secondary | ICD-10-CM | POA: Diagnosis not present

## 2019-10-14 DIAGNOSIS — G546 Phantom limb syndrome with pain: Secondary | ICD-10-CM | POA: Diagnosis not present

## 2019-10-14 DIAGNOSIS — T8789 Other complications of amputation stump: Secondary | ICD-10-CM | POA: Diagnosis not present

## 2019-10-17 DIAGNOSIS — E1122 Type 2 diabetes mellitus with diabetic chronic kidney disease: Secondary | ICD-10-CM | POA: Diagnosis not present

## 2019-10-17 DIAGNOSIS — Z794 Long term (current) use of insulin: Secondary | ICD-10-CM | POA: Diagnosis not present

## 2019-10-17 DIAGNOSIS — I5043 Acute on chronic combined systolic (congestive) and diastolic (congestive) heart failure: Secondary | ICD-10-CM | POA: Diagnosis not present

## 2019-10-17 DIAGNOSIS — N183 Chronic kidney disease, stage 3 unspecified: Secondary | ICD-10-CM | POA: Diagnosis not present

## 2019-10-17 DIAGNOSIS — F329 Major depressive disorder, single episode, unspecified: Secondary | ICD-10-CM | POA: Diagnosis not present

## 2019-10-17 DIAGNOSIS — Z89612 Acquired absence of left leg above knee: Secondary | ICD-10-CM | POA: Diagnosis not present

## 2019-10-17 DIAGNOSIS — F419 Anxiety disorder, unspecified: Secondary | ICD-10-CM | POA: Diagnosis not present

## 2019-10-17 DIAGNOSIS — I4821 Permanent atrial fibrillation: Secondary | ICD-10-CM | POA: Diagnosis not present

## 2019-10-17 DIAGNOSIS — I2510 Atherosclerotic heart disease of native coronary artery without angina pectoris: Secondary | ICD-10-CM | POA: Diagnosis not present

## 2019-10-17 DIAGNOSIS — G546 Phantom limb syndrome with pain: Secondary | ICD-10-CM | POA: Diagnosis not present

## 2019-10-17 DIAGNOSIS — Z79899 Other long term (current) drug therapy: Secondary | ICD-10-CM | POA: Diagnosis not present

## 2019-10-17 DIAGNOSIS — T8789 Other complications of amputation stump: Secondary | ICD-10-CM | POA: Diagnosis not present

## 2019-10-17 DIAGNOSIS — J9611 Chronic respiratory failure with hypoxia: Secondary | ICD-10-CM | POA: Diagnosis not present

## 2019-10-17 DIAGNOSIS — B1910 Unspecified viral hepatitis B without hepatic coma: Secondary | ICD-10-CM | POA: Diagnosis not present

## 2019-10-17 DIAGNOSIS — E1151 Type 2 diabetes mellitus with diabetic peripheral angiopathy without gangrene: Secondary | ICD-10-CM | POA: Diagnosis not present

## 2019-10-17 DIAGNOSIS — J45909 Unspecified asthma, uncomplicated: Secondary | ICD-10-CM | POA: Diagnosis not present

## 2019-10-17 DIAGNOSIS — G4733 Obstructive sleep apnea (adult) (pediatric): Secondary | ICD-10-CM | POA: Diagnosis not present

## 2019-10-17 DIAGNOSIS — I700 Atherosclerosis of aorta: Secondary | ICD-10-CM | POA: Diagnosis not present

## 2019-10-17 DIAGNOSIS — Z7901 Long term (current) use of anticoagulants: Secondary | ICD-10-CM | POA: Diagnosis not present

## 2019-10-17 DIAGNOSIS — I130 Hypertensive heart and chronic kidney disease with heart failure and stage 1 through stage 4 chronic kidney disease, or unspecified chronic kidney disease: Secondary | ICD-10-CM | POA: Diagnosis not present

## 2019-10-17 DIAGNOSIS — I252 Old myocardial infarction: Secondary | ICD-10-CM | POA: Diagnosis not present

## 2019-10-17 DIAGNOSIS — D62 Acute posthemorrhagic anemia: Secondary | ICD-10-CM | POA: Diagnosis not present

## 2019-10-17 DIAGNOSIS — I428 Other cardiomyopathies: Secondary | ICD-10-CM | POA: Diagnosis not present

## 2019-10-17 DIAGNOSIS — Z5181 Encounter for therapeutic drug level monitoring: Secondary | ICD-10-CM | POA: Diagnosis not present

## 2019-10-17 DIAGNOSIS — E785 Hyperlipidemia, unspecified: Secondary | ICD-10-CM | POA: Diagnosis not present

## 2019-10-19 ENCOUNTER — Encounter: Payer: Self-pay | Admitting: Physical Medicine and Rehabilitation

## 2019-10-19 ENCOUNTER — Encounter: Payer: Medicare Other | Admitting: Physical Medicine and Rehabilitation

## 2019-10-19 ENCOUNTER — Encounter
Payer: Medicare Other | Attending: Physical Medicine and Rehabilitation | Admitting: Physical Medicine and Rehabilitation

## 2019-10-19 ENCOUNTER — Other Ambulatory Visit: Payer: Self-pay

## 2019-10-19 VITALS — BP 138/67 | HR 86 | Temp 97.7°F | Ht 71.0 in | Wt 222.0 lb

## 2019-10-19 DIAGNOSIS — Z89612 Acquired absence of left leg above knee: Secondary | ICD-10-CM | POA: Diagnosis not present

## 2019-10-19 DIAGNOSIS — M62838 Other muscle spasm: Secondary | ICD-10-CM | POA: Diagnosis not present

## 2019-10-19 DIAGNOSIS — R339 Retention of urine, unspecified: Secondary | ICD-10-CM | POA: Diagnosis not present

## 2019-10-19 MED ORDER — BETHANECHOL CHLORIDE 10 MG PO TABS: 10.0000 mg | ORAL_TABLET | Freq: Three times a day (TID) | ORAL | 0 refills | Status: DC

## 2019-10-19 MED ORDER — CYCLOBENZAPRINE HCL 5 MG PO TABS: 5.0000 mg | ORAL_TABLET | Freq: Three times a day (TID) | ORAL | 0 refills | Status: AC

## 2019-10-19 NOTE — Progress Notes (Signed)
Subjective:    Patient ID: Joseph Hoffman, male    DOB: 1951/08/25, 68 y.o.   MRN: [id number]  HPI [name] presents for hospital follow-up following his left AKA. He is doing very well and has no complaints. When asked about pain, he reports that he has 3/10 left leg pain. This week with home PT he used a 10 pound weight and felt soreness in his residual limb afterward. He plans to ask his therapist to reduce the weight during his next session. He is happy with his progress with home PT and asks when he may transition to outpatient therapy. His home therapist has said he will work with him until mid-December. He also has phantom limb sensation which can sometimes become uncomfortable. He no longer takes medication for this pain and does not feel he requires medication at this time.   He denies constipation. His urinary retention has been well controlled with Baclofen. He will need renewals for his Bethanecol and Flexeril.   Pain Inventory Average Pain 3 Pain Right Now 0 My pain is intermittent and sharp  In the last 24 hours, has pain interfered with the following? General activity 0 Relation with others 0 Enjoyment of life 0 What TIME of day is your pain at its worst? evening Sleep (in general) Good  Pain is worse with: unsure Pain improves with: medication Relief from Meds: 5  Mobility ability to climb steps?  no do you drive?  no use a wheelchair  Function retired I need assistance with the following:  shopping  Neuro/Psych trouble walking  Prior Studies hospital f/u  Physicians involved in your care hospital f/u   Family History  Problem Relation Age of Onset  . Hypertension Mother   . Diabetes Father   . Heart attack Brother 29  . Hyperlipidemia Brother 31       stents placed   Social History   Socioeconomic History  . Marital status: Married    Spouse name: Joseph Hoffman  . Number of children: 3  . Years of education: 15+  . Highest education level: Not  on file  Occupational History    Employer: [employer] Needs  . Financial resource strain: Not on file  . Food insecurity    Worry: Not on file    Inability: Not on file  . Transportation needs    Medical: Not on file    Non-medical: Not on file  Tobacco Use  . Smoking status: Former Smoker    Packs/day: 0.25    Years: 10.00    Pack years: 2.50    Types: Cigarettes    Quit date: 09/25/1982    Years since quitting: 37.0  . Smokeless tobacco: Former Network engineer and Sexual Activity  . Alcohol use: Yes    Comment: occ. beer/wine  . Drug use: No  . Sexual activity: Yes    Birth control/protection: None  Lifestyle  . Physical activity    Days per week: Not on file    Minutes per session: Not on file  . Stress: Not on file  Relationships  . Social Herbalist on phone: Not on file    Gets together: Not on file    Attends religious service: Not on file    Active member of club or organization: Not on file    Attends meetings of clubs or organizations: Not on file    Relationship status: Not on file  Other Topics Concern  .  Not on file  Social History Narrative   Patient is married Joseph Hoffman) and lives with his wife and son.   Patient has three children.   Patient is working full-time.   Patient has a college education.   Patient is right handed.   Patient drinks about 2-3 cups of coffee daily.   Past Surgical History:  Procedure Laterality Date  . ABDOMINAL ANGIOGRAM  09/18/2012   Procedure: ABDOMINAL ANGIOGRAM;  Surgeon: Troy Sine, MD;  Location: [facility] CATH LAB;  Service: Cardiovascular;;  . ABDOMINAL AORTOGRAM W/LOWER EXTREMITY N/A 07/02/2019   Procedure: ABDOMINAL AORTOGRAM W/LOWER EXTREMITY;  Surgeon: Marty Heck, MD;  Location: [place] CV LAB;  Service: Cardiovascular;  Laterality: N/A;  . AMPUTATION Left 07/03/2019   Procedure: AMPUTATION LEFT GREAT TOE;  Surgeon: Marty Heck, MD;  Location: [place];  Service: Vascular;   Laterality: Left;  . AMPUTATION Left 07/31/2019   Procedure: AMPUTATION BELOW KNEE LEFT;  Surgeon: Marty Heck, MD;  Location: [place];  Service: Vascular;  Laterality: Left;  . AMPUTATION Left 08/13/2019   Procedure: LEFT AMPUTATION ABOVE KNEE;  Surgeon: Marty Heck, MD;  Location: [place];  Service: Vascular;  Laterality: Left;  . AORTIC VALVE REPLACEMENT  09/29/2012   Procedure: AORTIC VALVE REPLACEMENT (AVR);  Surgeon: Gaye Pollack, MD;  Location: [place];  Service: Open Heart Surgery;  Laterality: N/A;  . ARCH AORTOGRAM  09/18/2012   Procedure: ARCH AORTOGRAM;  Surgeon: Troy Sine, MD;  Location: [facility] CATH LAB;  Service: Cardiovascular;;  . CARDIAC CATHETERIZATION  09/18/12   severe calcific aortic stenosis, peak gradient 18mm, mean gradient 39mm, EF 45%  . CARDIAC VALVE REPLACEMENT     AVR 09-29-12  . CHOLECYSTECTOMY    . FRACTURE SURGERY    . LEFT AND RIGHT HEART CATHETERIZATION WITH CORONARY ANGIOGRAM N/A 09/18/2012   Procedure: LEFT AND RIGHT HEART CATHETERIZATION WITH CORONARY ANGIOGRAM;  Surgeon: Troy Sine, MD;  Location: [facility] CATH LAB;  Service: Cardiovascular;  Laterality: N/A;  . PERIPHERAL VASCULAR INTERVENTION Left 07/02/2019   Procedure: PERIPHERAL VASCULAR INTERVENTION;  Surgeon: Marty Heck, MD;  Location: [place] CV LAB;  Service: Cardiovascular;  Laterality: Left;  . RIGHT HEART CATH AND CORONARY ANGIOGRAPHY N/A 06/29/2019   Procedure: RIGHT HEART CATH AND CORONARY ANGIOGRAPHY;  Surgeon: Nelva Bush, MD;  Location: [place] CV LAB;  Service: Cardiovascular;  Laterality: N/A;  . TEE WITHOUT CARDIOVERSION N/A 05/29/2019   Procedure: TRANSESOPHAGEAL ECHOCARDIOGRAM (TEE);  Surgeon: Lelon Perla, MD;  Location: [facility] ENDOSCOPY;  Service: Cardiovascular;  Laterality: N/A;   Past Medical History:  Diagnosis Date  . Anxiety   . Arthritis   . Asthma   . CAD (coronary artery disease)   . Carotid artery disease ([place])    a. dopplers 06/2019  - Evidence consistent with a total occlusion of the left ICA, 1-39% RICA.  [name] Chronic combined systolic and diastolic CHF (congestive heart failure) ([place])   . CKD (chronic kidney disease), stage IV ([place])   . Diabetic foot ulcer ([place])   . DM (diabetes mellitus) ([place]) 09/30/2012  . Hepatitis B 2003  . Hx of AKA (above knee amputation), left ([place])   . Hyperlipidemia   . Hypertension   . Myocardial infarction ([place])   . Permanent atrial fibrillation, since 1994   . S/P AVR (aortic valve replacement), 09/30/2012   a. s/p mechanical AVR in 09/2012 (on Coumadin)  . Severe aortic insufficiency   . Sleep apnea  uses CPAP   BP 138/67   Hoffman 86   Temp 97.7 F (36.5 C)   Ht 5\' 11"  (1.803 m)   Wt 222 lb (100.7 kg) Comment: previously  SpO2 95%   BMI 30.96 kg/m   Opioid Risk Score:   Fall Risk Score:  `1  Depression screen PHQ 2/9  Depression screen [facility] 2/9 05/22/2019 11/10/2012  Decreased Interest 0 0  Down, Depressed, Hopeless 0 0  PHQ - 2 Score 0 0  Some recent data might be hidden    Review of Systems  Constitutional: Negative.   HENT: Negative.   Eyes: Negative.   Respiratory: Positive for apnea.   Cardiovascular: Negative.   Gastrointestinal: Negative.   Endocrine:       High blood sugar Low blood sugar  Genitourinary: Negative.   Musculoskeletal: Positive for gait problem.  Skin: Negative.   Allergic/Immunologic: Negative.   Hematological: Negative.   Psychiatric/Behavioral: Negative.   All other systems reviewed and are negative.      Objective:   Physical Exam Gen: no distress, normal appearing HEENT: oral mucosa pink and moist, NCAT Cardio: Reg rate Chest: normal effort, normal rate of breathing Abd: soft, non-distended Ext: no edema Skin: intact Neuro/Musculoskeletal: 5/5 strength throughout with the exception of his LLE with AKA. Residual limb beautifully healed, pink, healthy appearing. Patient knows hoe to apply shrinker independently.  Psych:  pleasant, normal affect      Assessment & Plan:  Mr. Dolton presents for hospital follow-up following his left AKA. -Residual limb is healing very well. Shrinker is in place. He will be fitted for prosthetic next week. -He continues home therapy at this time. His therapist used a 10 lb weight with him last week and he has been feeling more pain in left residual limb as a result. Discussed using lower weights in sessions moving forward. Will transition to outpatient therapy in 1 month. Discussed with patient that we can follow up via phone, Webex, or in-person at this time to prescribe outpatient physical therapy.  -Renewed his bethanecol and flexeril.  -Discussed pharmacologic options for neuropathic pain if phantom limb pain worsens.   Thirty minutes of face to face patient care time were spent during this visit. All questions were encouraged and answered. Follow up with me in 4 weeks.

## 2019-10-23 ENCOUNTER — Telehealth: Payer: Self-pay | Admitting: *Deleted

## 2019-10-23 DIAGNOSIS — I130 Hypertensive heart and chronic kidney disease with heart failure and stage 1 through stage 4 chronic kidney disease, or unspecified chronic kidney disease: Secondary | ICD-10-CM | POA: Diagnosis not present

## 2019-10-23 DIAGNOSIS — T8789 Other complications of amputation stump: Secondary | ICD-10-CM | POA: Diagnosis not present

## 2019-10-23 DIAGNOSIS — I5043 Acute on chronic combined systolic (congestive) and diastolic (congestive) heart failure: Secondary | ICD-10-CM | POA: Diagnosis not present

## 2019-10-23 DIAGNOSIS — E1151 Type 2 diabetes mellitus with diabetic peripheral angiopathy without gangrene: Secondary | ICD-10-CM | POA: Diagnosis not present

## 2019-10-23 DIAGNOSIS — G546 Phantom limb syndrome with pain: Secondary | ICD-10-CM | POA: Diagnosis not present

## 2019-10-23 DIAGNOSIS — D62 Acute posthemorrhagic anemia: Secondary | ICD-10-CM | POA: Diagnosis not present

## 2019-10-23 NOTE — Telephone Encounter (Signed)
Patients wife left a message stating they were in clinic recently and were seen by Dr. Ranell Patrick who filled in for Dr. Dagoberto Ligas due to illness.  They forgot to talk about a refill for clonazepam 0.25 disintegrating tablet.  They are asking if Dr. Dagoberto Ligas would refill this medication.

## 2019-10-26 NOTE — Telephone Encounter (Signed)
Asked them to get from PCP since is a benzo/controlled substance.  Per wife, was taking for a tremor and was prescribed inpt , however PCP would need to take over the Rx long term.   Let her know we were thinking about him, and to let us know if there's anything else, let us know and we'd be happy to help.

## 2019-11-04 ENCOUNTER — Other Ambulatory Visit: Payer: Self-pay | Admitting: Physical Medicine and Rehabilitation

## 2019-11-04 DIAGNOSIS — Z89612 Acquired absence of left leg above knee: Secondary | ICD-10-CM

## 2019-11-17 ENCOUNTER — Ambulatory Visit: Payer: Medicare Other | Admitting: Physical Medicine and Rehabilitation

## 2019-11-20 ENCOUNTER — Other Ambulatory Visit: Payer: Self-pay

## 2019-11-20 ENCOUNTER — Encounter
Payer: Medicare Other | Attending: Physical Medicine and Rehabilitation | Admitting: Physical Medicine and Rehabilitation

## 2019-11-20 ENCOUNTER — Encounter: Payer: Self-pay | Admitting: Physical Medicine and Rehabilitation

## 2019-11-20 VITALS — BP 127/55 | HR 73 | Ht 71.0 in | Wt 222.0 lb

## 2019-11-20 DIAGNOSIS — S88112S Complete traumatic amputation at level between knee and ankle, left lower leg, sequela: Secondary | ICD-10-CM

## 2019-11-20 DIAGNOSIS — G546 Phantom limb syndrome with pain: Secondary | ICD-10-CM | POA: Diagnosis not present

## 2019-11-20 DIAGNOSIS — R339 Retention of urine, unspecified: Secondary | ICD-10-CM | POA: Insufficient documentation

## 2019-11-20 DIAGNOSIS — Z89612 Acquired absence of left leg above knee: Secondary | ICD-10-CM

## 2019-11-20 DIAGNOSIS — M62838 Other muscle spasm: Secondary | ICD-10-CM | POA: Insufficient documentation

## 2019-11-20 MED ORDER — TAMSULOSIN HCL 0.4 MG PO CAPS: 0.4000 mg | ORAL_CAPSULE | Freq: Every day | ORAL | 1 refills | Status: AC

## 2019-11-20 MED ORDER — GABAPENTIN 100 MG PO CAPS: 100.0000 mg | ORAL_CAPSULE | Freq: Three times a day (TID) | ORAL | 1 refills | Status: DC

## 2019-11-20 NOTE — Progress Notes (Signed)
Subjective:    Patient ID: Joseph Hoffman, male    DOB: 06/10/1951, 68 y.o.   MRN: [id number]  HPI  Will be getting a new prosthetic leg on on the 29th. Continuing PT at [facility] outpatient and is happy with his progress.   Doing well with the mobility at home. Able to get around his home independently.  Has phantom limb sensation pain and would be interested in Gabapentin for pain relief.   Needs refill for Flomax   Pain Inventory Average Pain 0 Pain Right Now 0 My pain is na  In the last 24 hours, has pain interfered with the following? General activity 0 Relation with others 0 Enjoyment of life 0 What TIME of day is your pain at its worst? na Sleep (in general) Good  Pain is worse with: na Pain improves with: na Relief from Meds: na  Mobility use a walker ability to climb steps?  no use a wheelchair transfers alone  Function disabled: date disabled .  Neuro/Psych bladder control problems numbness trouble walking  Prior Studies Any changes since last visit?  no  Physicians involved in your care Any changes since last visit?  no   Family History  Problem Relation Age of Onset  . Hypertension Mother   . Diabetes Father   . Heart attack Brother 29  . Hyperlipidemia Brother 24       stents placed   Social History   Socioeconomic History  . Marital status: Married    Spouse name: [name]  . Number of children: 3  . Years of education: 15+  . Highest education level: Not on file  Occupational History    Employer: [employer]  Tobacco Use  . Smoking status: Former Smoker    Packs/day: 0.25    Years: 10.00    Pack years: 2.50    Types: Cigarettes    Quit date: 09/25/1982    Years since quitting: 37.1  . Smokeless tobacco: Former Network engineer and Sexual Activity  . Alcohol use: Yes    Comment: occ. beer/wine  . Drug use: No  . Sexual activity: Yes    Birth control/protection: None  Other Topics Concern  . Not on file  Social History  Narrative   Patient is married [name]) and lives with his wife and son.   Patient has three children.   Patient is working full-time.   Patient has a college education.   Patient is right handed.   Patient drinks about 2-3 cups of coffee daily.   Social Determinants of Health   Financial Resource Strain:   . Difficulty of Paying Living Expenses: Not on file  Food Insecurity:   . Worried About Charity fundraiser in the Last Year: Not on file  . Ran Out of Food in the Last Year: Not on file  Transportation Needs:   . Lack of Transportation (Medical): Not on file  . Lack of Transportation (Non-Medical): Not on file  Physical Activity:   . Days of Exercise per Week: Not on file  . Minutes of Exercise per Session: Not on file  Stress:   . Feeling of Stress : Not on file  Social Connections:   . Frequency of Communication with Friends and Family: Not on file  . Frequency of Social Gatherings with Friends and Family: Not on file  . Attends Religious Services: Not on file  . Active Member of Clubs or Organizations: Not on file  . Attends Archivist  Meetings: Not on file  . Marital Status: Not on file   Past Surgical History:  Procedure Laterality Date  . ABDOMINAL ANGIOGRAM  09/18/2012   Procedure: ABDOMINAL ANGIOGRAM;  Surgeon: Troy Sine, MD;  Location[facility] CATH LAB;  Service: Cardiovascular;;  . ABDOMINAL AORTOGRAM W/LOWER EXTREMITY N/A 07/02/2019   Procedure: ABDOMINAL AORTOGRAM W/LOWER EXTREMITY;  Surgeon: Marty Heck, MD;  Location: [place] CV LAB;  Service: Cardiovascular;  Laterality: N/A;  . AMPUTATION Left 07/03/2019   Procedure: AMPUTATION LEFT GREAT TOE;  Surgeon: Marty Heck, MD;  Location: [place];  Service: Vascular;  Laterality: Left;  . AMPUTATION Left 07/31/2019   Procedure: AMPUTATION BELOW KNEE LEFT;  Surgeon: Marty Heck, MD;  Location: [place];  Service: Vascular;  Laterality: Left;  . AMPUTATION Left 08/13/2019   Procedure:  LEFT AMPUTATION ABOVE KNEE;  Surgeon: Marty Heck, MD;  Location: [place];  Service: Vascular;  Laterality: Left;  . AORTIC VALVE REPLACEMENT  09/29/2012   Procedure: AORTIC VALVE REPLACEMENT (AVR);  Surgeon: Gaye Pollack, MD;  Location: [place];  Service: Open Heart Surgery;  Laterality: N/A;  . ARCH AORTOGRAM  09/18/2012   Procedure: ARCH AORTOGRAM;  Surgeon: Troy Sine, MD;  Location: [facility] CATH LAB;  Service: Cardiovascular;;  . CARDIAC CATHETERIZATION  09/18/12   severe calcific aortic stenosis, peak gradient 18mm, mean gradient 67mm, EF 45%  . CARDIAC VALVE REPLACEMENT     AVR 09-29-12  . CHOLECYSTECTOMY    . FRACTURE SURGERY    . LEFT AND RIGHT HEART CATHETERIZATION WITH CORONARY ANGIOGRAM N/A 09/18/2012   Procedure: LEFT AND RIGHT HEART CATHETERIZATION WITH CORONARY ANGIOGRAM;  Surgeon: Troy Sine, MD;  Location: [facility] CATH LAB;  Service: Cardiovascular;  Laterality: N/A;  . PERIPHERAL VASCULAR INTERVENTION Left 07/02/2019   Procedure: PERIPHERAL VASCULAR INTERVENTION;  Surgeon: Marty Heck, MD;  Location: [place] CV LAB;  Service: Cardiovascular;  Laterality: Left;  . RIGHT HEART CATH AND CORONARY ANGIOGRAPHY N/A 06/29/2019   Procedure: RIGHT HEART CATH AND CORONARY ANGIOGRAPHY;  Surgeon: Nelva Bush, MD;  Location: [place] CV LAB;  Service: Cardiovascular;  Laterality: N/A;  . TEE WITHOUT CARDIOVERSION N/A 05/29/2019   Procedure: TRANSESOPHAGEAL ECHOCARDIOGRAM (TEE);  Surgeon: Lelon Perla, MD;  Location: [facility] ENDOSCOPY;  Service: Cardiovascular;  Laterality: N/A;   Past Medical History:  Diagnosis Date  . Anxiety   . Arthritis   . Asthma   . CAD (coronary artery disease)   . Carotid artery disease ([place])    a. dopplers 06/2019 - Evidence consistent with a total occlusion of the left ICA, 1-39% RICA.  [name] Chronic combined systolic and diastolic CHF (congestive heart failure) ([place])   . CKD (chronic kidney disease), stage IV ([place])   . Diabetic foot  ulcer ([place])   . DM (diabetes mellitus) ([place]) 09/30/2012  . Hepatitis B 2003  . Hx of AKA (above knee amputation), left ([place])   . Hyperlipidemia   . Hypertension   . Myocardial infarction ([place])   . Permanent atrial fibrillation, since 1994   . S/P AVR (aortic valve replacement), 09/30/2012   a. s/p mechanical AVR in 09/2012 (on Coumadin)  . Severe aortic insufficiency   . Sleep apnea    uses CPAP   BP (!) 127/55   Pulse 73   SpO2 96%   Opioid Risk Score:   Fall Risk Score:  `1  Depression screen PHQ 2/9  Depression screen [facility] 2/9 10/19/2019 05/22/2019 11/10/2012  Decreased Interest  0 0 0  Down, Depressed, Hopeless 0 0 0  PHQ - 2 Score 0 0 0  Some recent data might be hidden    Review of Systems Negative except as indicated in HPI    Objective:   Physical Exam Not performed as patient seen via Webex       Assessment & Plan:  Mr. Shawhan presents for follow-up following his left AKA.  -Residual limb is healing well as per patient and as per my last evaluation. He will be fitted for prosthetic 12/29 -He continues home therapy at this time. Will transition to outpatient therapy in coming weeks. Discussed with patient that we can follow up via phone, Webex, or in-person at this time to prescribe outpatient physical therapy.  -Renewed his flomax. -Discussed pharmacologic options for neuropathic pain as phantom limb pain has worsened. Prescribed Gabapentin TID.  20 minutes of face to face patient care time were spent during this visit. All questions were encouraged and answered. Follow up with me in 4 weeks.

## 2019-12-09 ENCOUNTER — Other Ambulatory Visit: Payer: Self-pay

## 2019-12-09 ENCOUNTER — Other Ambulatory Visit: Payer: Self-pay | Admitting: Physical Medicine and Rehabilitation

## 2019-12-09 MED ORDER — SACUBITRIL-VALSARTAN 24-26 MG PO TABS: 1.0000 | ORAL_TABLET | Freq: Two times a day (BID) | ORAL | 3 refills | Status: DC

## 2019-12-09 NOTE — Telephone Encounter (Signed)
Message from patient

## 2019-12-09 NOTE — Telephone Encounter (Signed)
Patient called requesting a refill of cardiac medication.  Forwarding request to patients cardio doctor.

## 2019-12-16 ENCOUNTER — Encounter: Payer: Medicare Other | Admitting: Physical Therapy

## 2019-12-18 ENCOUNTER — Encounter
Payer: Medicare Other | Attending: Physical Medicine and Rehabilitation | Admitting: Physical Medicine and Rehabilitation

## 2019-12-18 ENCOUNTER — Other Ambulatory Visit: Payer: Self-pay

## 2019-12-18 ENCOUNTER — Encounter: Payer: Self-pay | Admitting: Physical Medicine and Rehabilitation

## 2019-12-18 DIAGNOSIS — R339 Retention of urine, unspecified: Secondary | ICD-10-CM | POA: Insufficient documentation

## 2019-12-18 DIAGNOSIS — Z89612 Acquired absence of left leg above knee: Secondary | ICD-10-CM | POA: Diagnosis not present

## 2019-12-18 DIAGNOSIS — M62838 Other muscle spasm: Secondary | ICD-10-CM | POA: Insufficient documentation

## 2019-12-18 MED ORDER — TORSEMIDE 20 MG PO TABS: 40.0000 mg | ORAL_TABLET | Freq: Every morning | ORAL | 3 refills | Status: AC

## 2019-12-18 MED ORDER — GABAPENTIN 100 MG PO CAPS: 100.0000 mg | ORAL_CAPSULE | Freq: Three times a day (TID) | ORAL | 1 refills | Status: AC

## 2019-12-18 MED ORDER — BETHANECHOL CHLORIDE 10 MG PO TABS: 10.0000 mg | ORAL_TABLET | Freq: Three times a day (TID) | ORAL | 0 refills | Status: DC

## 2019-12-18 MED ORDER — BETHANECHOL CHLORIDE 10 MG PO TABS: 10.0000 mg | ORAL_TABLET | Freq: Three times a day (TID) | ORAL | 3 refills | Status: AC

## 2019-12-18 NOTE — Progress Notes (Signed)
Subjective:    Patient ID: Joseph Hoffman, male    DOB: 07/04/1951, 69 y.o.   MRN: [id number]  HPI  Due to national recommendations of social distancing because of COVID 52, an audio/video tele-health visit is felt to be the most appropriate encounter for this patient at this time. See MyChart message from today for the patient's consent to a tele-health encounter with [place]. This is a follow up tele-visit via Webex. The patient is at home. MD is at office.   [name] presents for follow-up of his left AKA. He has been doing well at home. He has completed his home therapies and will be starting outpatient therapy on Tuesday. He has been doing daily exercises himself with his new prosthetic, which he received on the 29th. Able to get around his home independently. Has not attempted outdoor mobility.   Having regular BM; taking Colace.  Phantom limb sensations have resolved; taking Gabapentin 100mg  TID.   Sleeping well.   Needs refills for Torsemide, Bethanecol, and he will run out of Gabapentin by next visit.    Pain Inventory Average Pain 0 Pain Right Now 0 My pain is na  In the last 24 hours, has pain interfered with the following? General activity 0 Relation with others 0 Enjoyment of life 0 What TIME of day is your pain at its worst? na Sleep (in general) Good  Pain is worse with: na Pain improves with: na Relief from Meds: na  Mobility use a walker use a wheelchair needs help with transfers  Function disabled: date disabled .  Neuro/Psych bladder control problems numbness trouble walking  Prior Studies Any changes since last visit?  no  Physicians involved in your care Any changes since last visit?  no   Family History  Problem Relation Age of Onset  . Hypertension Mother   . Diabetes Father   . Heart attack Brother 59  . Hyperlipidemia Brother 26       stents placed   Social History   Socioeconomic  History  . Marital status: Married    Spouse name: Joseph Hoffman  . Number of children: 3  . Years of education: 15+  . Highest education level: Not on file  Occupational History    Employer: [employer]  Tobacco Use  . Smoking status: Former Smoker    Packs/day: 0.25    Years: 10.00    Pack years: 2.50    Types: Cigarettes    Quit date: 09/25/1982    Years since quitting: 37.2  . Smokeless tobacco: Former Network engineer and Sexual Activity  . Alcohol use: Yes    Comment: occ. beer/wine  . Drug use: No  . Sexual activity: Yes    Birth control/protection: None  Other Topics Concern  . Not on file  Social History Narrative   Patient is married Joseph Hoffman) and lives with his wife and son.   Patient has three children.   Patient is working full-time.   Patient has a college education.   Patient is right handed.   Patient drinks about 2-3 cups of coffee daily.   Social Determinants of Health   Financial Resource Strain:   . Difficulty of Paying Living Expenses: Not on file  Food Insecurity:   . Worried About Charity fundraiser in the Last Year: Not on file  . Ran Out of Food in the Last Year: Not on file  Transportation Needs:   . Lack of Transportation (  Medical): Not on file  . Lack of Transportation (Non-Medical): Not on file  Physical Activity:   . Days of Exercise per Week: Not on file  . Minutes of Exercise per Session: Not on file  Stress:   . Feeling of Stress : Not on file  Social Connections:   . Frequency of Communication with Friends and Family: Not on file  . Frequency of Social Gatherings with Friends and Family: Not on file  . Attends Religious Services: Not on file  . Active Member of Clubs or Organizations: Not on file  . Attends Archivist Meetings: Not on file  . Marital Status: Not on file   Past Surgical History:  Procedure Laterality Date  . ABDOMINAL ANGIOGRAM  09/18/2012   Procedure: ABDOMINAL ANGIOGRAM;  Surgeon: Troy Sine, MD;   Location[facility] CATH LAB;  Service: Cardiovascular;;  . ABDOMINAL AORTOGRAM W/LOWER EXTREMITY N/A 07/02/2019   Procedure: ABDOMINAL AORTOGRAM W/LOWER EXTREMITY;  Surgeon: Marty Heck, MD;  Location: [place] CV LAB;  Service: Cardiovascular;  Laterality: N/A;  . AMPUTATION Left 07/03/2019   Procedure: AMPUTATION LEFT GREAT TOE;  Surgeon: Marty Heck, MD;  Location: [place];  Service: Vascular;  Laterality: Left;  . AMPUTATION Left 07/31/2019   Procedure: AMPUTATION BELOW KNEE LEFT;  Surgeon: Marty Heck, MD;  Location: [place];  Service: Vascular;  Laterality: Left;  . AMPUTATION Left 08/13/2019   Procedure: LEFT AMPUTATION ABOVE KNEE;  Surgeon: Marty Heck, MD;  Location: [place];  Service: Vascular;  Laterality: Left;  . AORTIC VALVE REPLACEMENT  09/29/2012   Procedure: AORTIC VALVE REPLACEMENT (AVR);  Surgeon: Gaye Pollack, MD;  Location: [place];  Service: Open Heart Surgery;  Laterality: N/A;  . ARCH AORTOGRAM  09/18/2012   Procedure: ARCH AORTOGRAM;  Surgeon: Troy Sine, MD;  Location: [facility] CATH LAB;  Service: Cardiovascular;;  . CARDIAC CATHETERIZATION  09/18/12   severe calcific aortic stenosis, peak gradient 43mm, mean gradient 7mm, EF 45%  . CARDIAC VALVE REPLACEMENT     AVR 09-29-12  . CHOLECYSTECTOMY    . FRACTURE SURGERY    . LEFT AND RIGHT HEART CATHETERIZATION WITH CORONARY ANGIOGRAM N/A 09/18/2012   Procedure: LEFT AND RIGHT HEART CATHETERIZATION WITH CORONARY ANGIOGRAM;  Surgeon: Troy Sine, MD;  Location: [facility] CATH LAB;  Service: Cardiovascular;  Laterality: N/A;  . PERIPHERAL VASCULAR INTERVENTION Left 07/02/2019   Procedure: PERIPHERAL VASCULAR INTERVENTION;  Surgeon: Marty Heck, MD;  Location: [place] CV LAB;  Service: Cardiovascular;  Laterality: Left;  . RIGHT HEART CATH AND CORONARY ANGIOGRAPHY N/A 06/29/2019   Procedure: RIGHT HEART CATH AND CORONARY ANGIOGRAPHY;  Surgeon: Nelva Bush, MD;  Location: [place] CV LAB;   Service: Cardiovascular;  Laterality: N/A;  . TEE WITHOUT CARDIOVERSION N/A 05/29/2019   Procedure: TRANSESOPHAGEAL ECHOCARDIOGRAM (TEE);  Surgeon: Lelon Perla, MD;  Location: [facility] ENDOSCOPY;  Service: Cardiovascular;  Laterality: N/A;   Past Medical History:  Diagnosis Date  . Anxiety   . Arthritis   . Asthma   . CAD (coronary artery disease)   . Carotid artery disease ([place])    a. dopplers 06/2019 - Evidence consistent with a total occlusion of the left ICA, 1-39% RICA.  [name] Chronic combined systolic and diastolic CHF (congestive heart failure) ([place])   . CKD (chronic kidney disease), stage IV ([place])   . Diabetic foot ulcer ([place])   . DM (diabetes mellitus) ([place]) 09/30/2012  . Hepatitis B 2003  . Hx of  AKA (above knee amputation), left ([place])   . Hyperlipidemia   . Hypertension   . Myocardial infarction ([place])   . Permanent atrial fibrillation, since 1994   . S/P AVR (aortic valve replacement), 09/30/2012   a. s/p mechanical AVR in 09/2012 (on Coumadin)  . Severe aortic insufficiency   . Sleep apnea    uses CPAP   There were no vitals taken for this visit.  Opioid Risk Score:   Fall Risk Score:  `1  Depression screen PHQ 2/9  Depression screen [facility] 2/9 10/19/2019 05/22/2019 11/10/2012  Decreased Interest 0 0 0  Down, Depressed, Hopeless 0 0 0  PHQ - 2 Score 0 0 0  Some recent data might be hidden     Review of Systems  Constitutional: Negative.   HENT: Negative.   Eyes: Negative.   Respiratory: Negative.   Cardiovascular: Negative.   Gastrointestinal: Negative.   Endocrine: Negative.   Genitourinary: Positive for difficulty urinating.  Musculoskeletal: Positive for gait problem.  Skin: Negative.   Allergic/Immunologic: Negative.   Neurological: Positive for numbness.  Hematological: Negative.   Psychiatric/Behavioral: Negative.   All other systems reviewed and are negative.      Objective:   Physical Exam  Not performed given Webex visit.       Assessment  & Plan:  [name] presents for follow-up following his left AKA.  -Residual limb is healing well as per patient and as per my last evaluation. He has been fitted for prosthetic and wears for 45 min every day, does daily exercise regimen.   -He will transition to outpatient therapy on Tuesday.  -Provided refills for Torsemide, Bethanecol, and Gabapentin. Advised that he follow-up with his cardiologist as well.   20 minutes of face to face patient care time were spent during this visit. All questions were encouraged and answered. Follow up with me in2 months

## 2019-12-21 DIAGNOSIS — I4891 Unspecified atrial fibrillation: Secondary | ICD-10-CM | POA: Diagnosis not present

## 2019-12-21 DIAGNOSIS — Z7901 Long term (current) use of anticoagulants: Secondary | ICD-10-CM | POA: Diagnosis not present

## 2019-12-22 ENCOUNTER — Other Ambulatory Visit: Payer: Self-pay

## 2019-12-22 ENCOUNTER — Ambulatory Visit (INDEPENDENT_AMBULATORY_CARE_PROVIDER_SITE_OTHER): Payer: Medicare Other | Admitting: Physical Therapy

## 2019-12-22 ENCOUNTER — Encounter: Payer: Self-pay | Admitting: Physical Therapy

## 2019-12-22 VITALS — BP 140/69 | HR 107

## 2019-12-22 DIAGNOSIS — R293 Abnormal posture: Secondary | ICD-10-CM | POA: Diagnosis not present

## 2019-12-22 DIAGNOSIS — M25652 Stiffness of left hip, not elsewhere classified: Secondary | ICD-10-CM | POA: Diagnosis not present

## 2019-12-22 DIAGNOSIS — G546 Phantom limb syndrome with pain: Secondary | ICD-10-CM | POA: Diagnosis not present

## 2019-12-22 DIAGNOSIS — R2689 Other abnormalities of gait and mobility: Secondary | ICD-10-CM

## 2019-12-22 DIAGNOSIS — R2681 Unsteadiness on feet: Secondary | ICD-10-CM

## 2019-12-22 DIAGNOSIS — Z9181 History of falling: Secondary | ICD-10-CM

## 2019-12-22 DIAGNOSIS — M6281 Muscle weakness (generalized): Secondary | ICD-10-CM

## 2019-12-22 NOTE — Patient Instructions (Signed)
If wearing long pants, put pants on prosthesis first. Feed prosthesis thru left pant leg. You may have to take the shoe off.   1. Turn liner inside out. Make sure label is on top so strap is in front of leg. Fold liner along seams and place circle against bottom of your leg with fold level. Roll liner up your leg. You may need to scoot forward and turn to your right to clear your leg from w/c bottom.  2.  Feed strap thru slit in your prosthesis. Make sure strap is not twisted. Tighten strap when sitting so greater than 2 inches of rough and soft velcro overlap.  3. Stand up to a walker or counter. Wiggle your leg down into the prosthesis. Pull strap out of hole first then disconnect velcro & tighten as much as possible. 4. Pick left leg up 5-10 times putting weight on & off prosthesis. Readjust, pull strap out of hole first then disconnect velcro & tighten as much as possible. 5. Step left leg out & back 5-10 times putting weight on & off prosthesis. Readjust, pull strap out of hole first then disconnect velcro & tighten as much as possible.    6. Check strap only when standing. Good to check when finish toileting when standing before you pull your pants back up.   

## 2019-12-22 NOTE — Therapy (Signed)
[facility] Physical Therapy [address] [place], [place], [address] Phone: [phone]   Fax:  [phone]  Physical Therapy Evaluation  Patient Details  Name: Joseph Hoffman MRN: [id number] Date of Birth: May 16, 1951 Referring Provider (PT): Monica Martinez, MD    Encounter Date: 12/22/2019  PT End of Session - 12/22/19 1615    Visit Number  1    Number of Visits  25    Date for PT Re-Evaluation  03/17/20    Authorization Type  Medicare/Medicaid Primary, Generic [employer] Secondary    PT Start Time  1447    PT Stop Time  1532    PT Time Calculation (min)  45 min    Equipment Utilized During Treatment  Gait belt    Activity Tolerance  Patient tolerated treatment well    Behavior During Therapy  [facility] for tasks assessed/performed       Past Medical History:  Diagnosis Date  . Anxiety   . Arthritis   . Asthma   . CAD (coronary artery disease)   . Carotid artery disease ([place])    a. dopplers 06/2019 - Evidence consistent with a total occlusion of the left ICA, 1-39% RICA.  [name] Chronic combined systolic and diastolic CHF (congestive heart failure) ([place])   . CKD (chronic kidney disease), stage IV ([place])   . Diabetic foot ulcer ([place])   . DM (diabetes mellitus) ([place]) 09/30/2012  . Hepatitis B 2003  . Hx of AKA (above knee amputation), left ([place])   . Hyperlipidemia   . Hypertension   . Myocardial infarction ([place])   . Permanent atrial fibrillation, since 1994   . S/P AVR (aortic valve replacement), 09/30/2012   a. s/p mechanical AVR in 09/2012 (on Coumadin)  . Severe aortic insufficiency   . Sleep apnea    uses CPAP    Past Surgical History:  Procedure Laterality Date  . ABDOMINAL ANGIOGRAM  09/18/2012   Procedure: ABDOMINAL ANGIOGRAM;  Surgeon: Troy Sine, MD;  Location: [facility] CATH LAB;  Service: Cardiovascular;;  . ABDOMINAL AORTOGRAM W/LOWER EXTREMITY N/A 07/02/2019   Procedure: ABDOMINAL AORTOGRAM W/LOWER EXTREMITY;  Surgeon: Marty Heck, MD;   Location: [place] CV LAB;  Service: Cardiovascular;  Laterality: N/A;  . AMPUTATION Left 07/03/2019   Procedure: AMPUTATION LEFT GREAT TOE;  Surgeon: Marty Heck, MD;  Location: [place];  Service: Vascular;  Laterality: Left;  . AMPUTATION Left 07/31/2019   Procedure: AMPUTATION BELOW KNEE LEFT;  Surgeon: Marty Heck, MD;  Location: [place];  Service: Vascular;  Laterality: Left;  . AMPUTATION Left 08/13/2019   Procedure: LEFT AMPUTATION ABOVE KNEE;  Surgeon: Marty Heck, MD;  Location: [place];  Service: Vascular;  Laterality: Left;  . AORTIC VALVE REPLACEMENT  09/29/2012   Procedure: AORTIC VALVE REPLACEMENT (AVR);  Surgeon: Gaye Pollack, MD;  Location: [place];  Service: Open Heart Surgery;  Laterality: N/A;  . ARCH AORTOGRAM  09/18/2012   Procedure: ARCH AORTOGRAM;  Surgeon: Troy Sine, MD;  Location: [facility] CATH LAB;  Service: Cardiovascular;;  . CARDIAC CATHETERIZATION  09/18/12   severe calcific aortic stenosis, peak gradient 56mm, mean gradient 29mm, EF 45%  . CARDIAC VALVE REPLACEMENT     AVR 09-29-12  . CHOLECYSTECTOMY    . FRACTURE SURGERY    . LEFT AND RIGHT HEART CATHETERIZATION WITH CORONARY ANGIOGRAM N/A 09/18/2012   Procedure: LEFT AND RIGHT HEART CATHETERIZATION WITH CORONARY ANGIOGRAM;  Surgeon: Troy Sine, MD;  Location: [place] CATH LAB;  Service:  Cardiovascular;  Laterality: N/A;  . PERIPHERAL VASCULAR INTERVENTION Left 07/02/2019   Procedure: PERIPHERAL VASCULAR INTERVENTION;  Surgeon: Marty Heck, MD;  Location: [place] CV LAB;  Service: Cardiovascular;  Laterality: Left;  . RIGHT HEART CATH AND CORONARY ANGIOGRAPHY N/A 06/29/2019   Procedure: RIGHT HEART CATH AND CORONARY ANGIOGRAPHY;  Surgeon: Nelva Bush, MD;  Location: [place] CV LAB;  Service: Cardiovascular;  Laterality: N/A;  . TEE WITHOUT CARDIOVERSION N/A 05/29/2019   Procedure: TRANSESOPHAGEAL ECHOCARDIOGRAM (TEE);  Surgeon: Lelon Perla, MD;  Location: [facility]  ENDOSCOPY;  Service: Cardiovascular;  Laterality: N/A;    Vitals:   12/22/19 1505 12/22/19 1522 12/22/19 1529  BP: 140/69    Pulse: 69 (!) 101 (!) 107  SpO2: 97% 96% 95%  At 1505: resting, while sitting  At 1522: after standing balance assessment  At 1529: after gait     Subjective Assessment - 12/22/19 1452    Subjective  This 69yo male was referred to PT by Monica Martinez, MD for prosthetic gait training. He underwent a left Transfemoral Amputation on 08/13/2019 due to nonhealing left Transtibial Amputation 07/31/2019. He underwent a right heart cath & coronary angiography on 06/29/2019. Hx of Aortic Valve Replacement 2013. His valve is leaking and needs to be replaced again soon. He is working with cardiologist and his mobility with prosthesis can effect timing of this surgery. Received prosthesis from [employer] on 12/09/2019 and will have a f/u after 3 physical therapy treatment appts. States wearing prosthesis every other day since receiving the prosthesis for 45 minutes 2x/daily - d/t not having help to put on prosthesis. Wife just got a left TKA. Joseph Hoffman has phantom pain that comes up intermittently but does not interfere with daily activities.    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  No/denies         [facility] PT Assessment - 12/22/19 1458      Assessment   Medical Diagnosis  Left Transfemoral Amputation    Referring Provider (PT)  Monica Martinez, MD     Onset Date/Surgical Date  12/09/19   prosthesis delivery   Hand Dominance  Right      Precautions   Precautions  Fall;Other (comment)   new prosthesis, cardiac hx      Restrictions   Weight Bearing Restrictions  No       Balance Screen   Has the patient fallen in the past 6 months  Yes    How many times?  2    Has the patient had a decrease in activity level because of a fear of falling?   No    Is the patient reluctant to leave their home because of a fear of falling?   No      Home Environment   Living Environment  Private residence    Living Arrangements  Spouse/significant other;Children    Available Help at Discharge  Family   son has a pug but does not create fall hazard   Type of Home  House   everything he needs is on the first floor    [place] entrance;Stairs to enter    Entrance  Stairs-Number of Steps  4    Entrance Stairs-Rails  Right    Home Layout  Two level;Able to live on main level with bedroom/bathroom    Home Equipment  Wheelchair - manual;Walker - standard;Grab bars - toilet;Grab bars - tub/shower;Tub bench      Prior Function   Level of Independence  Independent    Vocation  On disability    Vocation Requirements  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     Leisure  travel       Observation/Other Assessments   Observations  RLE: edema present, dry skin; pt stated no wounds on R foot       Sensation   Additional Comments  RLE grossly intact; pt reported no sensation changes      Posture/Postural Control   Posture/Postural Control  Postural limitations    Postural Limitations  Rounded Shoulders;Forward head;Flexed trunk;Weight shift right    Posture Comments  flexed trunk may be indicative of hip flexor contractures       ROM / Strength   AROM / PROM / Strength  Strength;AROM      AROM   Overall AROM   Deficits    Overall AROM Comments  In standing, pt demonstrated -32* L hip extension demonstrated hip flexion contracture limiting hip extension       Strength   Overall Strength  Deficits    Overall Strength Comments  RLE grossly 4+/5 in sitting but functionally hip appears ~3/5      Transfers   Transfers  Sit to Stand;Stand to Sit     Sit to Stand  4: Min assist;From elevated surface;With upper extremity assist;With armrests;From chair/3-in-1   to RW for stabilization   Stand to Sit  4: Min guard;With upper extremity assist;With armrests;To elevated surface;To chair/3-in-1   from RW for stability   Comments  Cues for hand positioning: keeping L hand on RW in front of pt and only reaching back or pushing off with RUE to prevent uncontrolled prosthetic knee flexion and LOB       Ambulation/Gait   Ambulation/Gait  Yes    Ambulation/Gait Assistance  3: Mod assist   2 people for safety   Ambulation/Gait Assistance Details  PT demo & verbal cues prior to gait assessment for sequence / technique with TFA prosthesis & RW for upright posture, heel contact to lock prosthetic knee, step length and wt shift over prosthesis in stance to allow for RLE advancement. Excessive UE weight bearing on RW & manual assist to stabilize prosthetic knee ~25% of steps.     Ambulation Distance (Feet)  14 Feet    Assistive device  Rolling walker;Prosthesis    Gait Pattern  Step-to pattern;Decreased step length - right;Decreased stance time - left;Decreased stride length;Decreased hip/knee flexion - right;Decreased hip/knee flexion - left;Decreased weight shift to left;Trunk flexed;Wide base of support;Left hip hike;Left circumduction;Antalgic;Lateral hip instability;Poor foot clearance - right    Ambulation Surface  Indoor;Level      Balance   Balance Assessed  Yes      Static Standing Balance   Static Standing - Balance Support  Bilateral upper extremity supported    Static Standing - Level of Assistance  5: Stand by assistance    Static Standing - Comment/# of Minutes  maintains upright with RW support >2 minutes      Dynamic Standing Balance   Dynamic Standing - Balance Support  Bilateral upper extremity supported    Dynamic Standing -  Level of Assistance  4: Min assist   with RW support   Dynamic Standing - Balance Activities  Head  turns;Forward lean/weight shifting;Reaching for objects   reach to floor   Dynamic Standing - Comments  Pt demonstrated cervical ROM only with visual scanning environment without wt shifting. Pt able to reach anteriorly 10" with forward trunk lean and able to reach 10" from floor with minA.      Prosthetics Assessment - 12/22/19 1512      Prosthetics   Prosthetic Care Dependent with  Skin check;Residual limb care;Care of non-amputated limb;Prosthetic cleaning;Ply sock cleaning;Correct ply sock adjustment;Proper wear schedule/adjustment;Proper weight-bearing schedule/adjustment    Donning prosthesis   Max assist;Min assist   MinA sitting portion & maxA standing to tighten strap   Doffing prosthesis   Supervision    Current prosthetic wear tolerance (days/week)   every other day since receiving prosthesis 13 days prior to PT eval    Current prosthetic wear tolerance (#hours/day)   45 minutes 2x daily     Current prosthetic weight-bearing tolerance (hours/day)   Patient tolerated 3 miniutes of standing seated rest & 3 min gait with no c/o pain or discomfort    Edema  present; non-pitting     Residual limb condition   normal color, some darkness near scar adherence, overall good scar mobility, normal hair growth, normal temp     Prosthesis Description  ischial containment socket, gel silicone liner with lanyard suspension strap, SAFETY knee (unlocks with unweighting), single axis foot     K code/activity level with prosthetic use   K2 household ambulator, limited community                Objective measurements completed on examination: See above findings.      [facility] Adult PT Treatment/Exercise - 12/22/19 1512      Prosthetics   Prosthetic Care Comments    PT advise to continue wear time but increase to daily wear. Will continue to progress as indicated at further sessions. PT educated pt on wearing shrinker any time he is not wearing the liner or prosthesis to further control edema.  PT discussed gradual progression of increasing weight bearing tolerance and time schedule to build tolerance to WB and sweat environment. PT educated pt on washing the liner daily with soap & water.    Education Provided  Prosthetic cleaning;Proper Donning;Proper wear schedule/adjustment;Proper weight-bearing schedule/adjustment;Residual limb care;Skin check;Other (comment)   see prosthetic care comments   Person(s) Educated  Patient    Education Method  Explanation;Verbal cues;Demonstration;Tactile cues    Education Method  Verbalized understanding;Needs further instruction             PT Education - 12/22/19 1614    Education Details  PT role, exam findings, POC - 2x/week for 12 weeks; pt verbalized agreement    Person(s) Educated  Patient    Methods  Explanation    Comprehension  Verbalized understanding       PT Short Term Goals - 12/22/19 1622      PT SHORT TERM GOAL #1   Title  Patient verbalizes & demonstrates donning and doffing prosthesis with </= supervision only. (all STGs target date: 01/21/2020)    Time  4    Period  Weeks    Status  New    Target Date  01/21/20      PT SHORT TERM GOAL #2   Title  Patient tolerance wear of liner and prosthesis >8 hours  total /day with no  skin integrity issues or increased pain.    Time  4    Period  Weeks    Status  New    Target Date  01/21/20      PT SHORT TERM GOAL #3   Title  Patient standing balance with RW support & prosthesis: scans environment with cervical motion, reaches anteriorly 10", and reaches to floor within 10" with supervision only.    Time  4    Period  Weeks    Status  New    Target Date  01/21/20      PT SHORT TERM GOAL #4   Title  Patient ambulates 41' with RW and TFA prosthesis with minA and verbal cues.    Time  4    Period  Weeks    Status  New    Target Date  01/21/20        PT Long Term Goals - 12/22/19 1625      PT LONG TERM GOAL #1   Title  Patient verbalizes & demonstrates proper  prosthetic care including modified independent donning to enable safe utilization of prosthesis. (all LTGs target date: 03/17/2020)    Time  12    Period  Weeks    Status  New    Target Date  03/17/20      PT LONG TERM GOAL #2   Title  Patient tolerates wear of prosthesis >90% of awake hours without skin or limb pain issues to enable function during his day.    Time  12    Period  Weeks    Status  New    Target Date  03/17/20      PT LONG TERM GOAL #3   Title  Standing balance with RW support & prosthesis: reaches 10" anteriorly, picks up object from floor, scans environment & manages clothes modified independent.    Time  12    Period  Weeks    Status  New    Target Date  03/17/20      PT LONG TERM GOAL #4   Title  Patient ambulates 150' with prosthesis & RW minimizing UE weight bearing  modified independent    Time  12    Period  Weeks    Status  New    Target Date  03/17/20      PT LONG TERM GOAL #5   Title  Patient negotiates ramps & curbs with RW & prosthesis modified independent to enable community access.    Time  12    Period  Weeks    Status  New    Target Date  03/17/20             Plan - 12/22/19 1617    Clinical Impression Statement  Patient is a 69yo male who underwent a left transfemoral amputation on 08/13/2019 and received his first prosthesis on 12/19/2019. His PMH is significant for: L TFA, DM2, CHF EF 30-35%, CAD, MI, aortic valve disease, PAD, Myalgia, CKD st3, HTN, arthritis, asthma, HepB. He needs aortic valve replaced again and will need to be able to stand & walk with minimal UE support to adhere to sternal precautions following this surgery. He is dependent in all prosthetic care and requires maxA to donne prosthesis. He has worn the liner and prosthesis every other day for 45 minutes 2x/daily since receiving which limits function during his day and increases energy expenditure for activities. He requires minA for dynamic standing balance with RW  support & prosthesis.  His gait has gait deviations including excessive UE weight bearing on RW and requires mod-maxA for 14' which limits function. PT examination revealed weakness, significant stiffness, decreased activity tolerance, and deficits in balance. Patient would benefit from skilled PT to improve his ability to use the prosthesis and improve his safety & mobility.    Personal Factors and Comorbidities  Comorbidity 3+;Fitness;Age;Past/Current Experience    Comorbidities  L TFA, DM2, CHF EF 30-35%, CAD, MI, aortic valve disease, PAD, Myalgia, CKD st3, HTN, arthritis, asthma, HepB,    Examination-Activity Limitations  Transfers;Locomotion Level;Squat;Stairs;Stand    Examination-Participation Restrictions  Community Activity;Driving    Stability/Clinical Decision Making  Evolving/Moderate complexity    Clinical Decision Making  Moderate    Rehab Potential  Good    PT Frequency  2x / week    PT Duration  12 weeks    PT Treatment/Interventions  ADLs/Self Care Home Management;Gait training;Neuromuscular re-education;Functional mobility training;Stair training;Therapeutic activities;Therapeutic exercise;Balance training;Prosthetic Training;Orthotic Fit/Training;Patient/family education;DME Instruction;Passive range of motion;Scar mobilization;Manual techniques    PT Next Visit Plan  increase wear time as tolerated. initiate HEP - stretching and wt shifting at sink; gait with RW, standing balance    Consulted and Agree with Plan of Care  Patient       Patient will benefit from skilled therapeutic intervention in order to improve the following deficits and impairments:  Abnormal gait, Decreased range of motion, Difficulty walking, Prosthetic Dependency, Decreased endurance, Decreased activity tolerance, Obesity, Decreased balance, Decreased knowledge of use of DME, Increased edema, Decreased strength, Postural dysfunction, Decreased mobility, Impaired flexibility, Cardiopulmonary status limiting  activity  Visit Diagnosis: Other abnormalities of gait and mobility  Muscle weakness (generalized)  Unsteadiness on feet  Stiffness of left hip, not elsewhere classified  Abnormal posture  Phantom limb syndrome with pain ([place])  History of falling     Problem List Patient Active Problem List   Diagnosis Date Noted  . Widened pulse pressure   . Acute blood loss anemia   . Labile blood glucose   . Above knee amputation of left lower extremity ([place]) 08/17/2019  . Nonhealing surgical wound 08/13/2019  . Non-healing amputation site ([place]) 08/13/2019  . Type 2 diabetes mellitus with peripheral neuropathy (H[facility])   . Chronic combined systolic and diastolic CHF (congestive heart failure) ([place])   . Myalgia   . Urinary retention   . Insomnia due to medical condition   . Drug-induced constipation   . Anticoagulated on Coumadin   . Unilateral complete BKA, left, initial encounter ([place]) 08/05/2019  . Leukocytosis   . Acute on chronic anemia   . Post-operative pain   . Nausea & vomiting 07/29/2019  . Diarrhea 07/29/2019  . Diabetic ulcer of left foot associated with diabetes mellitus due to underlying condition ([place]) 07/29/2019  . Left ventricular ejection fraction of 30% to 35% 07/29/2019  . PAD (peripheral artery disease) ([place]) 07/28/2019  . CAD, multiple vessel 07/10/2019  . Severe aortic regurgitation   . Aortic valve disease   . Foot ulcer, left ([place]) 06/26/2019  . Depression 05/28/2019  . Congestive heart failure (CHF) ([place]) 05/13/2019  . H/O heart valve replacement with mechanical valve   . Type II diabetes mellitus with renal manifestations ([place]) 05/12/2019  . Chest pain 05/12/2019  . Elevated troponin 05/12/2019  . Stage 3 chronic kidney disease 05/12/2019  . Acute on chronic systolic (congestive) heart failure ([place]) 05/12/2019  . CRI (chronic renal insufficiency), stage 3 (moderate) 04/09/2019  . Acute combined systolic and diastolic heart failure (  [place]) 04/09/2019   . Essential hypertension 06/03/2017  . Dyslipidemia, goal LDL below 70 04/11/2017  . AS (aortic stenosis) 10/28/2012  . S/P AVR, 09/29/12, [employer]. (discharged 10/05/12) 09/30/2012  . Permanent atrial fibrillation, since 1994 09/30/2012  . Type 2 IDDM 09/30/2012  . OSA on CPAP 09/30/2012  . Normal coronary arteries at cath Oct 2013 09/30/2012  . Chronic anticoagulation 09/30/2012  . Severe aortic stenosis 09/25/2012   [name] SPT 12/22/2019, 4:56 PM  During this evaluation / treatment session, the Physical Therapist/Physical Therapist Assistant was present and participating in the session. This entire session was performed under direct supervision and direction of a licensed therapist/therapist assistant . I have personally read, edited and approve of the note as written.  Jamey Reas PT, DPT 12/23/2019, 9:13 AM  [facility] Physical Therapy [address] [place], [place], [address] Phone: [phone]   Fax:  [phone]  Name: Joseph Hoffman MRN: [id number] Date of Birth: [birth year]

## 2019-12-28 ENCOUNTER — Encounter: Payer: Medicare Other | Admitting: Physical Therapy

## 2020-01-05 ENCOUNTER — Encounter: Payer: Medicare Other | Admitting: Physical Therapy

## 2020-01-07 ENCOUNTER — Other Ambulatory Visit: Payer: Self-pay

## 2020-01-07 ENCOUNTER — Ambulatory Visit (INDEPENDENT_AMBULATORY_CARE_PROVIDER_SITE_OTHER): Payer: Medicare Other | Admitting: Physical Therapy

## 2020-01-07 ENCOUNTER — Encounter: Payer: Self-pay | Admitting: Physical Therapy

## 2020-01-07 DIAGNOSIS — M6281 Muscle weakness (generalized): Secondary | ICD-10-CM | POA: Diagnosis not present

## 2020-01-07 DIAGNOSIS — R2681 Unsteadiness on feet: Secondary | ICD-10-CM

## 2020-01-07 DIAGNOSIS — R293 Abnormal posture: Secondary | ICD-10-CM | POA: Diagnosis not present

## 2020-01-07 DIAGNOSIS — M25652 Stiffness of left hip, not elsewhere classified: Secondary | ICD-10-CM

## 2020-01-07 DIAGNOSIS — R2689 Other abnormalities of gait and mobility: Secondary | ICD-10-CM

## 2020-01-07 DIAGNOSIS — G546 Phantom limb syndrome with pain: Secondary | ICD-10-CM

## 2020-01-07 NOTE — Therapy (Signed)
[facility] Physical Therapy [address] [place], [place], [address] Phone: [phone]   Fax:  [phone]  Physical Therapy Treatment  Patient Details  Name: Joseph Hoffman MRN: [id number] Date of Birth: 10-05-1951 Referring Provider (PT): Monica Martinez, MD    Encounter Date: 01/07/2020  PT End of Session - 01/07/20 1438    Visit Number  2    Number of Visits  25    Date for PT Re-Evaluation  03/17/20    Authorization Type  Medicare/Medicaid Primary, Generic [employer] Secondary    PT Start Time  1100    PT Stop Time  1145    PT Time Calculation (min)  45 min    Equipment Utilized During Treatment  Gait belt    Activity Tolerance  Patient tolerated treatment well    Behavior During Therapy  [facility] for tasks assessed/performed       Past Medical History:  Diagnosis Date  . Anxiety   . Arthritis   . Asthma   . CAD (coronary artery disease)   . Carotid artery disease ([place])    a. dopplers 06/2019 - Evidence consistent with a total occlusion of the left ICA, 1-39% RICA.  [name] Chronic combined systolic and diastolic CHF (congestive heart failure) ([place])   . CKD (chronic kidney disease), stage IV ([place])   . Diabetic foot ulcer ([place])   . DM (diabetes mellitus) ([place]) 09/30/2012  . Hepatitis B 2003  . Hx of AKA (above knee amputation), left ([place])   . Hyperlipidemia   . Hypertension   . Myocardial infarction ([place])   . Permanent atrial fibrillation, since 1994   . S/P AVR (aortic valve replacement), 09/30/2012   a. s/p mechanical AVR in 09/2012 (on Coumadin)  . Severe aortic insufficiency   . Sleep apnea    uses CPAP    Past Surgical History:  Procedure Laterality Date  . ABDOMINAL ANGIOGRAM  09/18/2012   Procedure: ABDOMINAL ANGIOGRAM;  Surgeon: Troy Sine, MD;  Location: [facility] CATH LAB;  Service: Cardiovascular;;  . ABDOMINAL AORTOGRAM W/LOWER EXTREMITY N/A 07/02/2019   Procedure: ABDOMINAL AORTOGRAM W/LOWER EXTREMITY;  Surgeon: Marty Heck, MD;   Location: [place] CV LAB;  Service: Cardiovascular;  Laterality: N/A;  . AMPUTATION Left 07/03/2019   Procedure: AMPUTATION LEFT GREAT TOE;  Surgeon: Marty Heck, MD;  Location: [place];  Service: Vascular;  Laterality: Left;  . AMPUTATION Left 07/31/2019   Procedure: AMPUTATION BELOW KNEE LEFT;  Surgeon: Marty Heck, MD;  Location: [place];  Service: Vascular;  Laterality: Left;  . AMPUTATION Left 08/13/2019   Procedure: LEFT AMPUTATION ABOVE KNEE;  Surgeon: Marty Heck, MD;  Location: [place];  Service: Vascular;  Laterality: Left;  . AORTIC VALVE REPLACEMENT  09/29/2012   Procedure: AORTIC VALVE REPLACEMENT (AVR);  Surgeon: Gaye Pollack, MD;  Location: [place];  Service: Open Heart Surgery;  Laterality: N/A;  . ARCH AORTOGRAM  09/18/2012   Procedure: ARCH AORTOGRAM;  Surgeon: Troy Sine, MD;  Location: [facility] CATH LAB;  Service: Cardiovascular;;  . CARDIAC CATHETERIZATION  09/18/12   severe calcific aortic stenosis, peak gradient 21mm, mean gradient 90mm, EF 45%  . CARDIAC VALVE REPLACEMENT     AVR 09-29-12  . CHOLECYSTECTOMY    . FRACTURE SURGERY    . LEFT AND RIGHT HEART CATHETERIZATION WITH CORONARY ANGIOGRAM N/A 09/18/2012   Procedure: LEFT AND RIGHT HEART CATHETERIZATION WITH CORONARY ANGIOGRAM;  Surgeon: Troy Sine, MD;  Location: [facility] CATH LAB;  Service:  Cardiovascular;  Laterality: N/A;  . PERIPHERAL VASCULAR INTERVENTION Left 07/02/2019   Procedure: PERIPHERAL VASCULAR INTERVENTION;  Surgeon: Marty Heck, MD;  Location: [place] CV LAB;  Service: Cardiovascular;  Laterality: Left;  . RIGHT HEART CATH AND CORONARY ANGIOGRAPHY N/A 06/29/2019   Procedure: RIGHT HEART CATH AND CORONARY ANGIOGRAPHY;  Surgeon: Nelva Bush, MD;  Location: [place] CV LAB;  Service: Cardiovascular;  Laterality: N/A;  . TEE WITHOUT CARDIOVERSION N/A 05/29/2019   Procedure: TRANSESOPHAGEAL ECHOCARDIOGRAM (TEE);  Surgeon: Lelon Perla, MD;  Location: [facility]  ENDOSCOPY;  Service: Cardiovascular;  Laterality: N/A;    There were no vitals filed for this visit.  Subjective Assessment - 01/07/20 1104    Subjective  He has been wearing prosthesis ~5hrs 1x/day.  No issues with skin. Working with cardiologist to schedule Aortic Valve replacement but knows goal is less UE support first.    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  No/denies                       [facility] Adult PT Treatment/Exercise - 01/07/20 1100      Transfers   Transfers  Sit to Stand;Stand to Sit    Sit to Stand  4: Min guard;5: Supervision;With upper extremity assist;With armrests;From chair/3-in-1   to RW   Sit to Stand Details  Visual cues for safe use of DME/AE;Verbal cues for technique;Verbal cues for safe use of DME/AE    Stand to Sit  5: Supervision;With upper extremity assist;With armrests;To chair/3-in-1;Other (comment)   from RW   Stand to Sit Details (indicate cue type and reason)  Visual cues for safe use of DME/AE;Verbal cues for sequencing;Verbal cues for technique;Verbal cues for safe use of DME/AE      Ambulation/Gait   Ambulation/Gait  Yes    Ambulation/Gait Assistance  4: Min assist    Ambulation/Gait Assistance Details  demo, verbal & tactile cues on initial contact with heel when lower shank swings forward, wt shift over prosthesis pulling heel to ground to stabilize knee and terminal stance knee flexion to advance prosthesis.     Ambulation Distance (Feet)  40 Feet   40' X 2 and turning 90* to position to sit   Assistive device  Rolling walker;Prosthesis    Gait Pattern  Step-to pattern;Decreased step length - right;Decreased stance time - left;Decreased  stride length;Decreased hip/knee flexion - right;Decreased hip/knee flexion - left;Decreased weight shift to left;Trunk flexed;Wide base of support;Left hip hike;Left circumduction;Antalgic;Lateral hip instability;Poor foot clearance - right    Ambulation Surface  Indoor;Level      Prosthetics   Prosthetic Care Comments   Washing liner daily but can be when he is bathing. Waiting to end of day may be tired.  As he becomes more mobile with prosthesis, higher fall risk when prosthesis off.     Current prosthetic wear tolerance (days/week)   daily since PT eval    Current prosthetic wear tolerance (#hours/day)   up to 4-5 hrs 1x/day.  PT recommended 4hrs 2x/day. Educated on rational for 2x/day to build to LTG of all day wear to enable function during full day.  Current prosthetic weight-bearing tolerance (hours/day)   Patient tolerated 3 miniutes of standing seated rest & 3 min gait with no c/o pain or discomfort    Edema  present; non-pitting     Residual limb condition   normal color, some darkness near scar adherence, overall good scar mobility, normal hair growth, normal temp     Education Provided  Prosthetic cleaning;Proper Donning;Proper wear schedule/adjustment;Proper weight-bearing schedule/adjustment;Residual limb care;Skin check;Other (comment);Correct ply sock adjustment   see prosthetic care comments   Person(s) Educated  Patient    Education Method  Explanation;Verbal cues;Demonstration;Tactile cues;Handout    Education Method  Verbalized understanding;Returned demonstration;Tactile cues required;Verbal cues required;Needs further instruction    Donning Prosthesis  Supervision               PT Short Term Goals - 01/07/20 1438      PT SHORT TERM GOAL #1   Title  Patient verbalizes & demonstrates donning and doffing prosthesis with </= supervision only. (all STGs target date: 01/21/2020)    Time  4    Period  Weeks    Status  On-going    Target Date  01/21/20      PT  SHORT TERM GOAL #2   Title  Patient tolerance wear of liner and prosthesis >8 hours  total /day with no skin integrity issues or increased pain.    Time  4    Period  Weeks    Status  On-going    Target Date  01/21/20      PT SHORT TERM GOAL #3   Title  Patient standing balance with RW support & prosthesis: scans environment with cervical motion, reaches anteriorly 10", and reaches to floor within 10" with supervision only.    Time  4    Period  Weeks    Status  On-going    Target Date  01/21/20      PT SHORT TERM GOAL #4   Title  Patient ambulates 60' with RW and TFA prosthesis with minA and verbal cues.    Time  4    Period  Weeks    Status  On-going    Target Date  01/21/20        PT Long Term Goals - 01/07/20 1439      PT LONG TERM GOAL #1   Title  Patient verbalizes & demonstrates proper prosthetic care including modified independent donning to enable safe utilization of prosthesis. (all LTGs target date: 03/17/2020)    Time  12    Period  Weeks    Status  On-going    Target Date  03/17/20      PT LONG TERM GOAL #2   Title  Patient tolerates wear of prosthesis >90% of awake hours without skin or limb pain issues to enable function during his day.    Time  12    Period  Weeks    Status  On-going    Target Date  03/17/20      PT LONG TERM GOAL #3   Title  Standing balance with RW support & prosthesis: reaches 10" anteriorly, picks up object from floor, scans environment & manages clothes modified independent.    Time  12    Period  Weeks    Status  On-going    Target Date  03/17/20      PT LONG TERM GOAL #4   Title  Patient ambulates 150' with prosthesis & RW minimizing UE weight bearing  modified independent  Time  12    Period  Weeks    Status  On-going    Target Date  03/17/20      PT LONG TERM GOAL #5   Title  Patient negotiates ramps & curbs with RW & prosthesis modified independent to enable community access.    Time  12    Period  Weeks    Status   On-going    Target Date  03/17/20            Plan - 01/07/20 1439    Clinical Impression Statement  PT educated patient on progressing overall wear & thereby potential for function by twice / day wear.  PT progressed sit to/from stand and prosthetic gait with RW including turning 90* to position to sit down. He improved 2nd gait from 1st with education in technique.    Personal Factors and Comorbidities  Comorbidity 3+;Fitness;Age;Past/Current Experience    Comorbidities  L TFA, DM2, CHF EF 30-35%, CAD, MI, aortic valve disease, PAD, Myalgia, CKD st3, HTN, arthritis, asthma, HepB,    Examination-Activity Limitations  Transfers;Locomotion Level;Squat;Stairs;Stand    Examination-Participation Restrictions  Community Activity;Driving    Stability/Clinical Decision Making  Evolving/Moderate complexity    Rehab Potential  Good    PT Frequency  2x / week    PT Duration  12 weeks    PT Treatment/Interventions  ADLs/Self Care Home Management;Gait training;Neuromuscular re-education;Functional mobility training;Stair training;Therapeutic activities;Therapeutic exercise;Balance training;Prosthetic Training;Orthotic Fit/Training;Patient/family education;DME Instruction;Passive range of motion;Scar mobilization;Manual techniques    PT Next Visit Plan  work towards STGs, add HEP for standing balance (RW vs sink)    Consulted and Agree with Plan of Care  Patient       Patient will benefit from skilled therapeutic intervention in order to improve the following deficits and impairments:  Abnormal gait, Decreased range of motion, Difficulty walking, Prosthetic Dependency, Decreased endurance, Decreased activity tolerance, Obesity, Decreased balance, Decreased knowledge of use of DME, Increased edema, Decreased strength, Postural dysfunction, Decreased mobility, Impaired flexibility, Cardiopulmonary status limiting activity  Visit Diagnosis: Other abnormalities of gait and mobility  Unsteadiness on  feet  Abnormal posture  Muscle weakness  Stiffness of left hip, not elsewhere classified  Phantom limb syndrome with pain [facility])     Problem List Patient Active Problem List   Diagnosis Date Noted  . Widened pulse pressure   . Acute blood loss anemia   . Labile blood glucose   . Above knee amputation of left lower extremity ([place]) 08/17/2019  . Nonhealing surgical wound 08/13/2019  . Non-healing amputation site ([place]) 08/13/2019  . Type 2 diabetes mellitus with peripheral neuropathy (HCC)   . Chronic combined systolic and diastolic CHF (congestive heart failure) ([place])   . Myalgia   . Urinary retention   . Insomnia due to medical condition   . Drug-induced constipation   . Anticoagulated on Coumadin   . Unilateral complete BKA, left, initial encounter ([place]) 08/05/2019  . Leukocytosis   . Acute on chronic anemia   . Post-operative pain   . Nausea & vomiting 07/29/2019  . Diarrhea 07/29/2019  . Diabetic ulcer of left foot associated with diabetes mellitus due to underlying condition ([place]) 07/29/2019  . Left ventricular ejection fraction of 30% to 35% 07/29/2019  . PAD (peripheral artery disease) ([place]) 07/28/2019  . CAD, multiple vessel 07/10/2019  . Severe aortic regurgitation   . Aortic valve disease   . Foot ulcer, left ([place]) 06/26/2019  . Depression 05/28/2019  . Congestive heart failure (CHF) (  [place]) 05/13/2019  . H/O heart valve replacement with mechanical valve   . Type II diabetes mellitus with renal manifestations ([place]) 05/12/2019  . Chest pain 05/12/2019  . Elevated troponin 05/12/2019  . Stage 3 chronic kidney disease 05/12/2019  . Acute on chronic systolic (congestive) heart failure ([place]) 05/12/2019  . CRI (chronic renal insufficiency), stage 3 (moderate) 04/09/2019  . Acute combined systolic and diastolic heart failure ([place]) 04/09/2019  . Essential hypertension 06/03/2017  . Dyslipidemia, goal LDL below 70 04/11/2017  . AS (aortic stenosis) 10/28/2012  . S/P  AVR, 09/29/12, [employer]. (discharged 10/05/12) 09/30/2012  . Permanent atrial fibrillation, since 1994 09/30/2012  . Type 2 IDDM 09/30/2012  . OSA on CPAP 09/30/2012  . Normal coronary arteries at cath Oct 2013 09/30/2012  . Chronic anticoagulation 09/30/2012  . Severe aortic stenosis 09/25/2012    Jamey Reas PT, DPT 01/07/2020, 11:16 PM  [facility] Physical Therapy [address] [place], [place], [address] Phone: [phone]   Fax:  [phone]  Name: Joseph Hoffman MRN: [id number] Date of Birth: 1951/06/08

## 2020-01-07 NOTE — Patient Instructions (Signed)
If wearing long pants, put pants on prosthesis first. Feed prosthesis thru left pant leg. You may have to take the shoe off.   1. Turn liner inside out. Make sure label is on top so strap is in front of leg. Fold liner along seams and place circle against bottom of your leg with fold level. Roll liner up your leg. You may need to scoot forward and turn to your right to clear your leg from w/c bottom.  2.  Feed strap thru slit in your prosthesis. Make sure strap is not twisted. Tighten strap when sitting so greater than 2 inches of rough and soft velcro overlap.  3. Stand up to a walker or counter. Wiggle your leg down into the prosthesis. Pull strap out of hole first then disconnect velcro & tighten as much as possible. 4. Pick left leg up 5-10 times putting weight on & off prosthesis. Readjust, pull strap out of hole first then disconnect velcro & tighten as much as possible. 5. Step left leg out & back 5-10 times putting weight on & off prosthesis. Readjust, pull strap out of hole first then disconnect velcro & tighten as much as possible.    6. Check strap only when standing. Good to check when finish toileting when standing before you pull your pants back up.   

## 2020-01-11 ENCOUNTER — Encounter: Payer: Self-pay | Admitting: Physical Therapy

## 2020-01-11 NOTE — Therapy (Signed)
[facility] [place], [place], [address] Phone: [phone]   Fax:  [phone]  Patient Details  Name: Joseph Hoffman MRN: [id number] Date of Birth: October 20, 1951 Referring Provider:  Monica Martinez, MD  Encounter Date: 01/20/2020  PHYSICAL THERAPY DISCHARGE SUMMARY  Visits from Start of Care: 2  Current functional level related to goals / functional outcomes:  Patient's wife called on 01/24/2020 to report that Mr. Lazalde died over the weekend.    Remaining deficits: NA   Education / Equipment: NA Plan: Patient goals were not met. Patient is being discharged due to deceased.        Jamey Reas, PT, DPT 01/07/2020, 12:47 PM  [facility] Physical Therapy [address] [place], [place], [address] Phone: [phone]   Fax:  [phone]

## 2020-01-12 ENCOUNTER — Encounter: Payer: Medicare Other | Admitting: Physical Therapy

## 2020-01-13 ENCOUNTER — Telehealth: Payer: Self-pay

## 2020-01-13 NOTE — Telephone Encounter (Signed)
Recieved faxed medication refill request for entresto 24-26mg  tab.  Since this is a cardiac medication it should be a cardiac healthcare provider that should refill and manage this medication.

## 2020-01-14 ENCOUNTER — Encounter: Payer: Medicare Other | Admitting: Physical Therapy

## 2020-01-14 NOTE — Telephone Encounter (Signed)
Patient was last seen by me in July 2020.  He has subsequently had several hospitalizations including amputations.  We will arrange to see the patient evaluation.

## 2020-01-19 ENCOUNTER — Encounter: Payer: Medicare Other | Admitting: Physical Therapy

## 2020-01-20 ENCOUNTER — Other Ambulatory Visit: Payer: Self-pay

## 2020-01-21 ENCOUNTER — Encounter: Payer: Medicare Other | Admitting: Physical Therapy

## 2020-01-25 ENCOUNTER — Other Ambulatory Visit: Payer: Self-pay

## 2020-01-25 MED ORDER — SACUBITRIL-VALSARTAN 24-26 MG PO TABS: 1.0000 | ORAL_TABLET | Freq: Two times a day (BID) | ORAL | 3 refills | Status: DC

## 2020-01-25 MED ORDER — SACUBITRIL-VALSARTAN 24-26 MG PO TABS: 1.0000 | ORAL_TABLET | Freq: Two times a day (BID) | ORAL | 3 refills | Status: AC

## 2020-01-26 ENCOUNTER — Encounter: Payer: Medicare Other | Admitting: Physical Therapy

## 2020-02-01 DIAGNOSIS — 419620001 Death: Secondary | SNOMED CT | POA: Diagnosis not present

## 2020-02-01 DEATH — deceased

## 2020-02-02 ENCOUNTER — Encounter: Payer: Medicare Other | Admitting: Physical Therapy

## 2020-02-04 ENCOUNTER — Encounter: Payer: Medicare Other | Admitting: Physical Therapy

## 2020-02-09 ENCOUNTER — Encounter: Payer: Medicare Other | Admitting: Physical Therapy

## 2020-02-11 ENCOUNTER — Encounter: Payer: Medicare Other | Admitting: Physical Therapy

## 2020-02-16 ENCOUNTER — Encounter: Payer: Medicare Other | Admitting: Physical Therapy

## 2020-02-16 ENCOUNTER — Ambulatory Visit: Payer: Medicare Other | Admitting: Physical Medicine and Rehabilitation

## 2020-02-18 ENCOUNTER — Encounter: Payer: Medicare Other | Admitting: Physical Therapy

## 2020-02-23 ENCOUNTER — Encounter: Payer: Medicare Other | Admitting: Physical Therapy

## 2020-02-25 ENCOUNTER — Encounter: Payer: Medicare Other | Admitting: Physical Therapy

## 2020-03-01 ENCOUNTER — Encounter: Payer: Medicare Other | Admitting: Physical Therapy

## 2020-03-03 ENCOUNTER — Encounter: Payer: Medicare Other | Admitting: Physical Therapy

## 2020-03-08 ENCOUNTER — Encounter: Payer: Medicare Other | Admitting: Physical Therapy

## 2020-03-10 ENCOUNTER — Encounter: Payer: Medicare Other | Admitting: Physical Therapy

## 2020-03-22 ENCOUNTER — Encounter: Payer: Medicare Other | Admitting: Physical Therapy

## 2020-03-24 ENCOUNTER — Encounter: Payer: Medicare Other | Admitting: Physical Therapy

## 2020-04-04 ENCOUNTER — Telehealth: Payer: Self-pay | Admitting: Cardiovascular Disease

## 2020-04-04 NOTE — Telephone Encounter (Signed)
New Message:   Wife wanted Dr Claiborne Billings to know pt died on February 03, 2020.

## 2020-04-04 NOTE — Telephone Encounter (Signed)
Returned call to wife, aware message received and will send to Dr. Claiborne Billings to make him aware.     Condolences given.

## 2020-04-05 NOTE — Telephone Encounter (Signed)
Thanks for letting me know!

## 2021-07-11 IMAGING — DX LEFT FOOT - COMPLETE 3+ VIEW
3 series · 3 of 3 positions shown · non-contrast
Comparison: None.

CLINICAL DATA: Osteomyelitis of left great toe and foot. Hx of
diabetes, CHF, arthritis.

EXAM:
LEFT FOOT - COMPLETE 3+ VIEW

[foot ap]
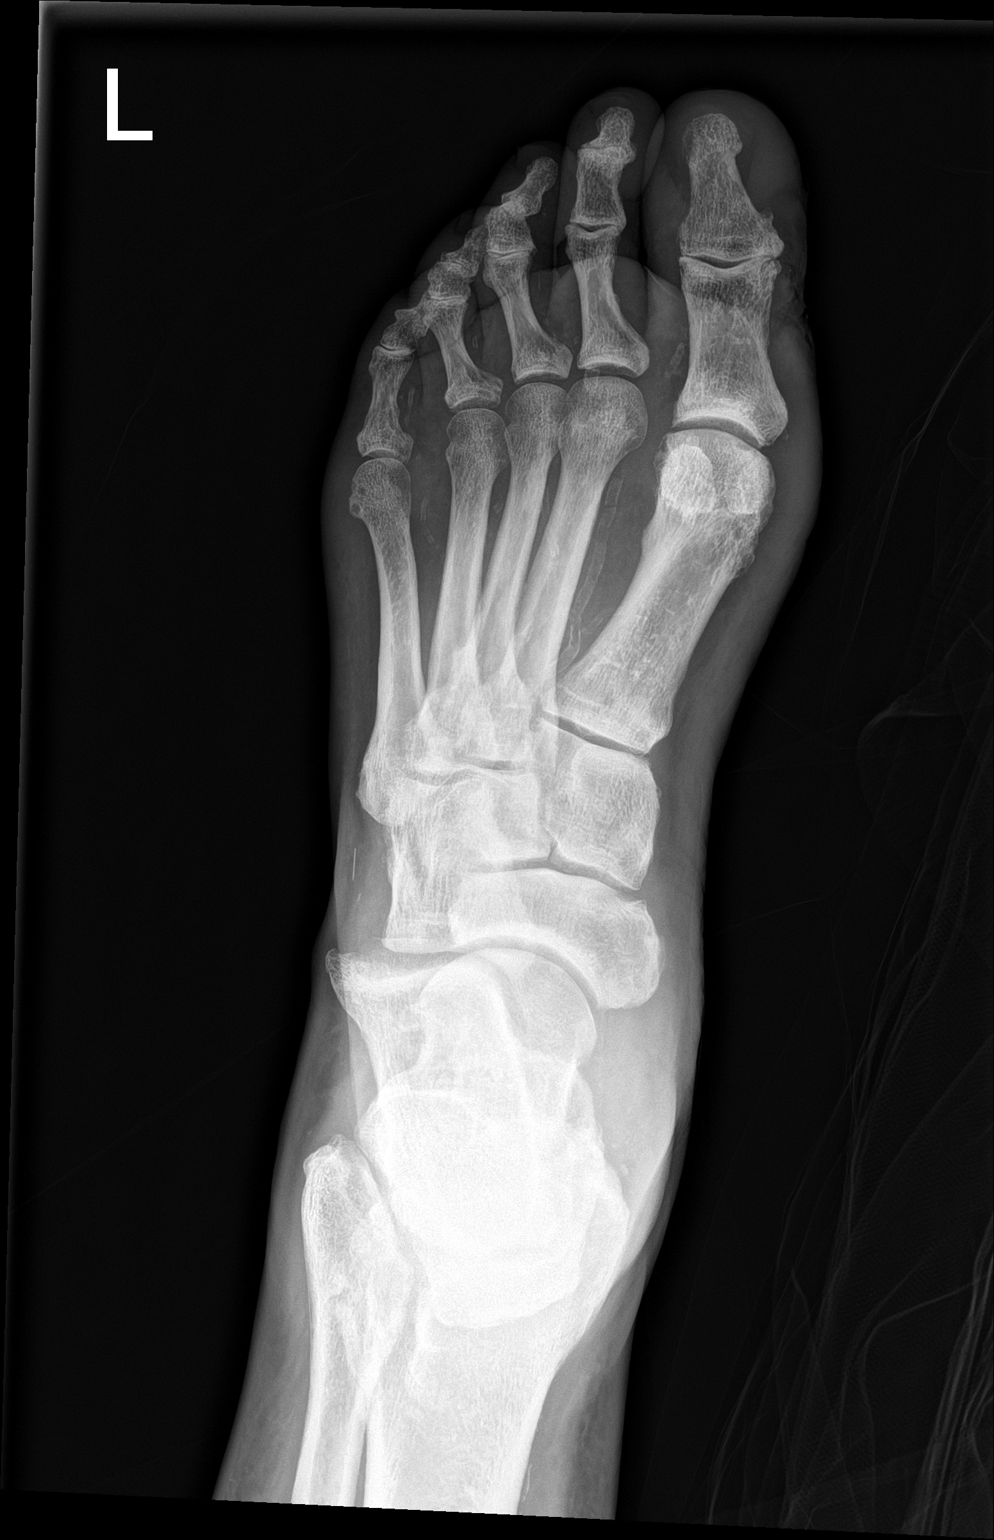

[foot obl]
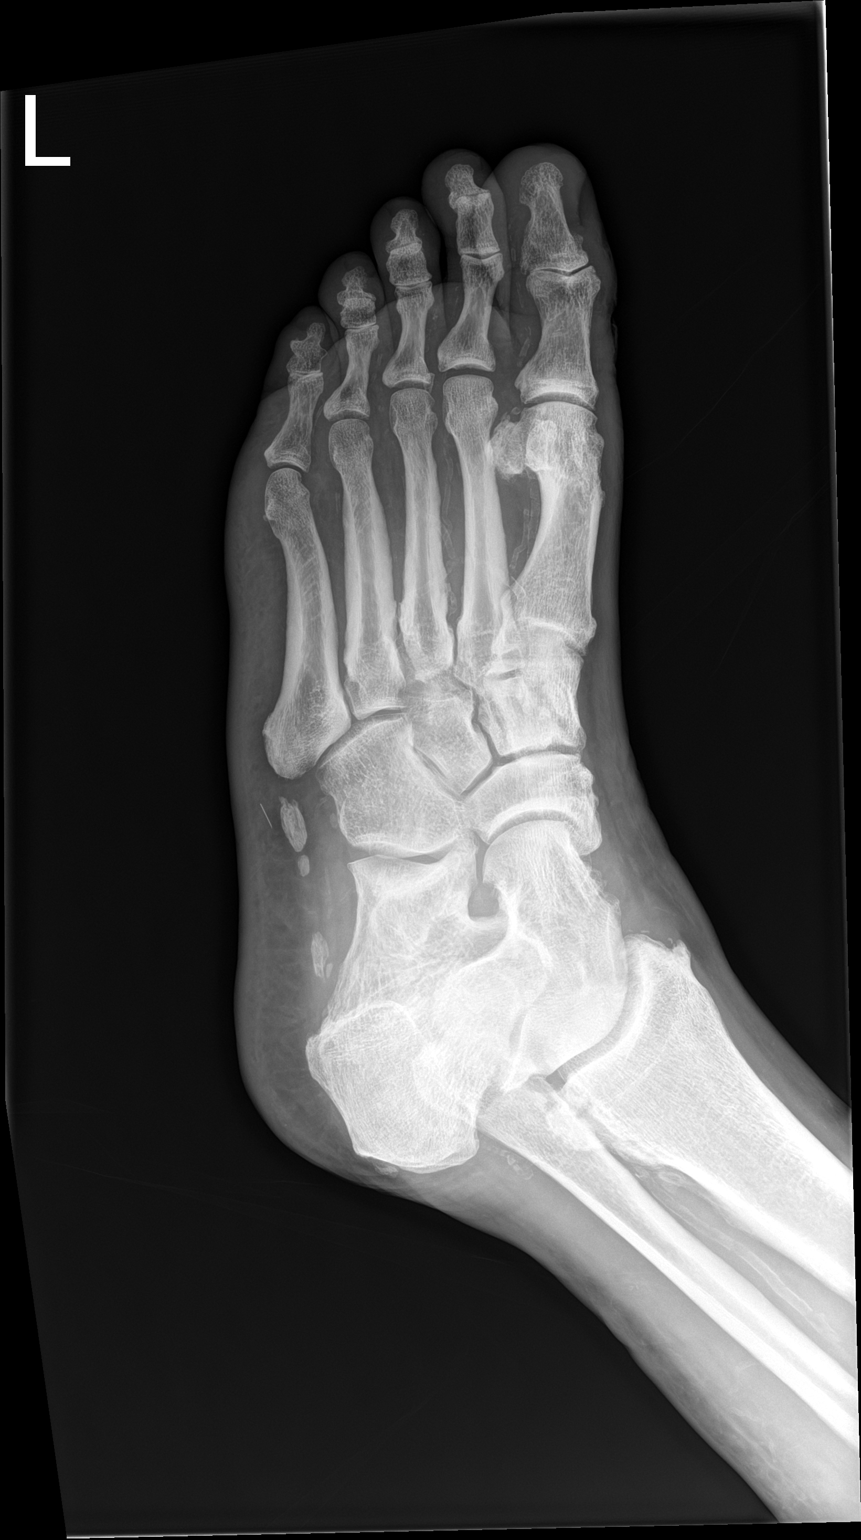

[foot lat]
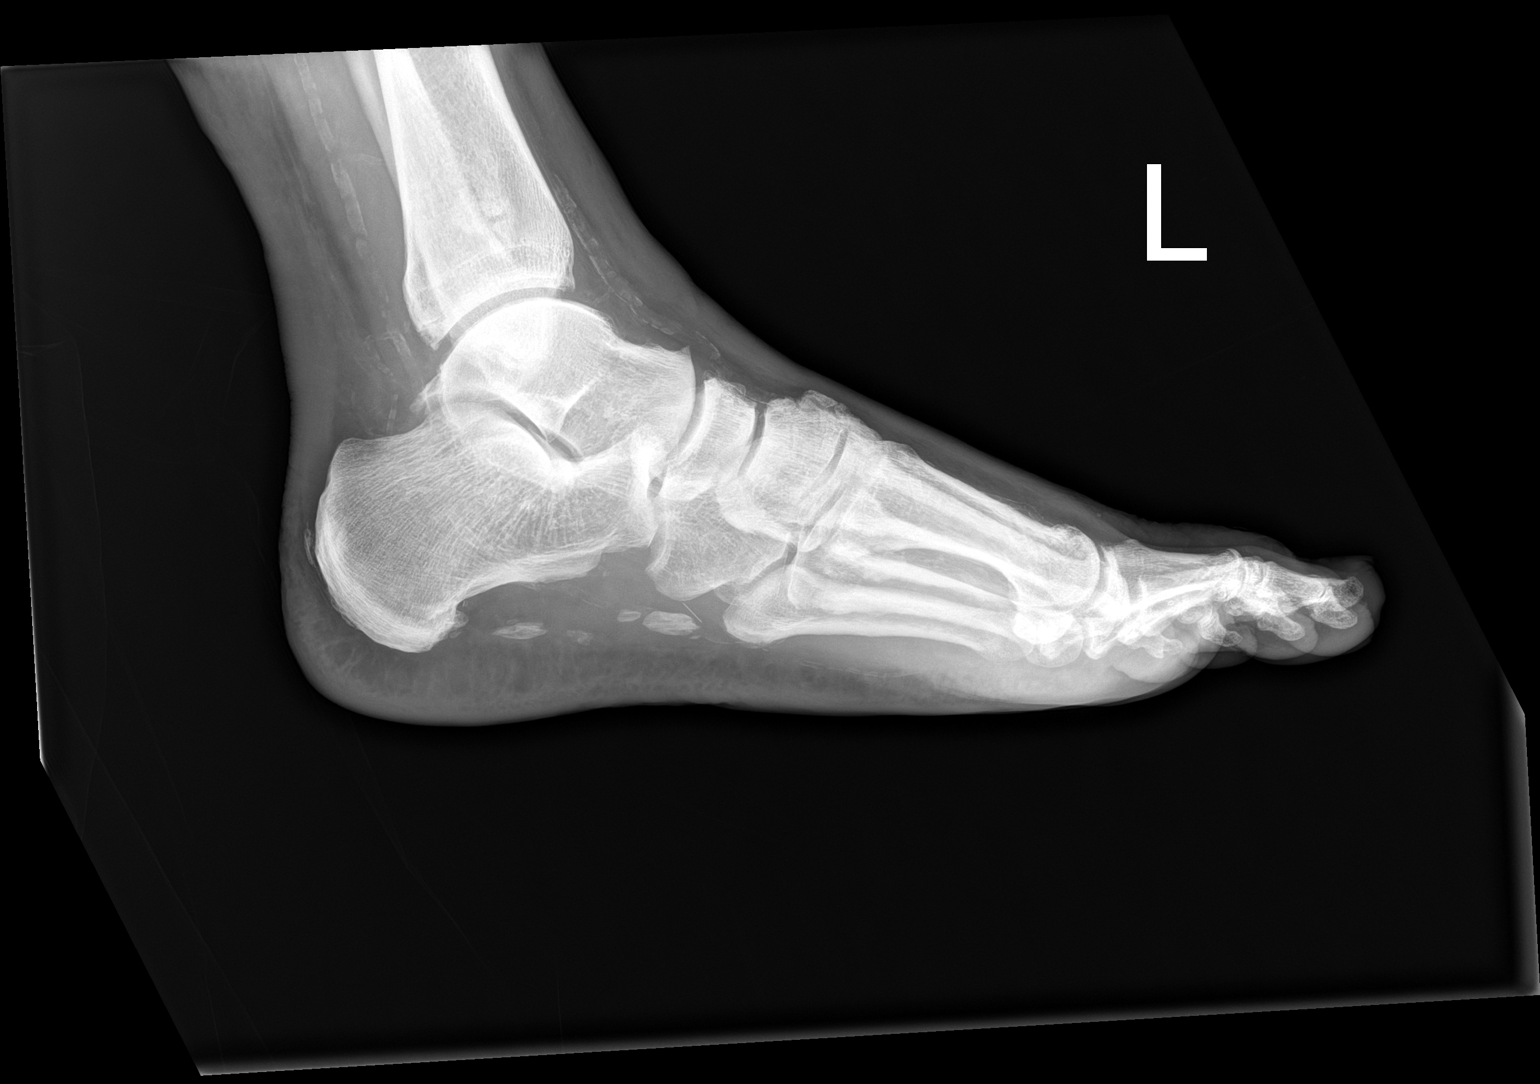

[3 of 3 positions shown; findings below may reference images not displayed]

FINDINGS: Soft tissue ulceration noted along the medial margin of the great
toe at and proximal to the level of the IP joint.

No bone resorption or focal osteopenia to suggest osteomyelitis.

No fractures.  No dislocation.

There is narrowing of the interphalangeal joints consistent with
mild osteoarthritis. Mild degenerative spurring is noted along the
dorsal aspect the intertarsal articulations.

Ossification is noted along plantar fascia and there are
calcifications along the arteries of the ankle and foot.
IMPRESSION: 1. No fracture.
2. No radiographic evidence of osteomyelitis.
3. Also along the medial margin of the great toe.
